# Patient Record
Sex: Male | Born: 1960
Health system: Southern US, Community
[De-identification: ages and names within clinical notes are randomized; demographics above are authoritative.]

## PROBLEM LIST (undated history)

## (undated) DIAGNOSIS — I639 Cerebral infarction, unspecified: Secondary | ICD-10-CM

## (undated) DIAGNOSIS — I452 Bifascicular block: Secondary | ICD-10-CM

## (undated) DIAGNOSIS — I441 Atrioventricular block, second degree: Secondary | ICD-10-CM

## (undated) DIAGNOSIS — N182 Chronic kidney disease, stage 2 (mild): Secondary | ICD-10-CM

## (undated) DIAGNOSIS — I890 Lymphedema, not elsewhere classified: Secondary | ICD-10-CM

## (undated) DIAGNOSIS — I422 Other hypertrophic cardiomyopathy: Secondary | ICD-10-CM

## (undated) DIAGNOSIS — I10 Essential (primary) hypertension: Secondary | ICD-10-CM

## (undated) DIAGNOSIS — R609 Edema, unspecified: Secondary | ICD-10-CM

## (undated) DIAGNOSIS — I421 Obstructive hypertrophic cardiomyopathy: Secondary | ICD-10-CM

## (undated) DIAGNOSIS — R0600 Dyspnea, unspecified: Secondary | ICD-10-CM

## (undated) DIAGNOSIS — E119 Type 2 diabetes mellitus without complications: Secondary | ICD-10-CM

## (undated) DIAGNOSIS — I5030 Unspecified diastolic (congestive) heart failure: Secondary | ICD-10-CM

## (undated) DIAGNOSIS — J42 Unspecified chronic bronchitis: Secondary | ICD-10-CM

## (undated) DIAGNOSIS — Z872 Personal history of diseases of the skin and subcutaneous tissue: Secondary | ICD-10-CM

## (undated) DIAGNOSIS — H269 Unspecified cataract: Secondary | ICD-10-CM

## (undated) DIAGNOSIS — Z950 Presence of cardiac pacemaker: Secondary | ICD-10-CM

## (undated) DIAGNOSIS — M109 Gout, unspecified: Secondary | ICD-10-CM

## (undated) DIAGNOSIS — J45998 Other asthma: Secondary | ICD-10-CM

## (undated) DIAGNOSIS — J189 Pneumonia, unspecified organism: Secondary | ICD-10-CM

## (undated) DIAGNOSIS — G510 Bell's palsy: Secondary | ICD-10-CM

## (undated) DIAGNOSIS — E7521 Fabry (-Anderson) disease: Secondary | ICD-10-CM

## (undated) DIAGNOSIS — R0602 Shortness of breath: Secondary | ICD-10-CM

## (undated) DIAGNOSIS — T7840XA Allergy, unspecified, initial encounter: Secondary | ICD-10-CM

## (undated) HISTORY — DX: Bifascicular block: I45.2

## (undated) HISTORY — DX: Allergy, unspecified, initial encounter: T78.40XA

## (undated) HISTORY — DX: Unspecified cataract: H26.9

## (undated) HISTORY — DX: Atrioventricular block, second degree: I44.1

## (undated) HISTORY — DX: Obstructive hypertrophic cardiomyopathy: I42.1

## (undated) HISTORY — PX: CARDIAC CATHETERIZATION: SHX172

## (undated) HISTORY — DX: Cerebral infarction, unspecified: I63.9

## (undated) HISTORY — DX: Chronic diastolic (congestive) heart failure: I50.32

---

## 2008-06-07 DIAGNOSIS — I5032 Chronic diastolic (congestive) heart failure: Secondary | ICD-10-CM

## 2008-06-07 HISTORY — DX: Chronic diastolic (congestive) heart failure: I50.32

## 2008-09-06 ENCOUNTER — Encounter: Admission: RE | Admit: 2008-09-06 | Discharge: 2008-09-06 | Payer: Self-pay | Admitting: Family Medicine

## 2009-03-27 ENCOUNTER — Encounter: Admission: RE | Admit: 2009-03-27 | Discharge: 2009-03-27 | Payer: Self-pay | Admitting: Internal Medicine

## 2009-04-04 ENCOUNTER — Encounter: Admission: RE | Admit: 2009-04-04 | Discharge: 2009-04-04 | Payer: Self-pay | Admitting: Internal Medicine

## 2009-12-11 ENCOUNTER — Observation Stay (HOSPITAL_COMMUNITY): Admission: EM | Admit: 2009-12-11 | Discharge: 2009-12-12 | Payer: Self-pay | Admitting: Emergency Medicine

## 2009-12-11 ENCOUNTER — Encounter (INDEPENDENT_AMBULATORY_CARE_PROVIDER_SITE_OTHER): Payer: Self-pay | Admitting: Emergency Medicine

## 2009-12-11 ENCOUNTER — Ambulatory Visit: Payer: Self-pay | Admitting: Surgery

## 2010-02-27 ENCOUNTER — Encounter: Payer: Self-pay | Admitting: Internal Medicine

## 2010-03-13 ENCOUNTER — Ambulatory Visit (HOSPITAL_COMMUNITY): Admission: RE | Admit: 2010-03-13 | Discharge: 2010-03-13 | Payer: Self-pay | Admitting: Obstetrics & Gynecology

## 2010-04-27 ENCOUNTER — Telehealth: Payer: Self-pay | Admitting: Internal Medicine

## 2010-06-07 DIAGNOSIS — E119 Type 2 diabetes mellitus without complications: Secondary | ICD-10-CM

## 2010-06-07 HISTORY — DX: Type 2 diabetes mellitus without complications: E11.9

## 2010-06-28 ENCOUNTER — Encounter: Payer: Self-pay | Admitting: Family Medicine

## 2010-07-07 NOTE — Progress Notes (Signed)
Summary: nos appt  Phone Note Call from Patient   Caller: [email or web address]  Call For: young Summary of Call: LMTCB x2 to rsc nos from 11/18. Initial call taken by: Netta Neat,  April 27, 2010 2:54 PM

## 2010-07-07 NOTE — Letter (Signed)
Summary: [facility]  Genetics/[facility]   Imported By: Bubba Hales 03/31/2010 07:26:18  _____________________________________________________________________  External Attachment:    Type:   Image     Comment:   External Document

## 2010-07-31 ENCOUNTER — Emergency Department (HOSPITAL_COMMUNITY)
Admission: EM | Admit: 2010-07-31 | Discharge: 2010-07-31 | Disposition: A | Discharge status: Home / Self Care | Payer: 59 | Attending: Emergency Medicine | Admitting: Emergency Medicine

## 2010-07-31 ENCOUNTER — Inpatient Hospital Stay (INDEPENDENT_AMBULATORY_CARE_PROVIDER_SITE_OTHER)
Admission: RE | Admit: 2010-07-31 | Discharge: 2010-07-31 | Disposition: A | Discharge status: Home / Self Care | Payer: 59 | Source: Ambulatory Visit | Attending: Family Medicine | Admitting: Family Medicine

## 2010-07-31 DIAGNOSIS — I10 Essential (primary) hypertension: Secondary | ICD-10-CM | POA: Insufficient documentation

## 2010-07-31 DIAGNOSIS — L02419 Cutaneous abscess of limb, unspecified: Secondary | ICD-10-CM

## 2010-07-31 DIAGNOSIS — E756 Lipid storage disorder, unspecified: Secondary | ICD-10-CM | POA: Insufficient documentation

## 2010-07-31 DIAGNOSIS — L03119 Cellulitis of unspecified part of limb: Secondary | ICD-10-CM

## 2010-07-31 DIAGNOSIS — R609 Edema, unspecified: Secondary | ICD-10-CM | POA: Insufficient documentation

## 2010-07-31 DIAGNOSIS — M7989 Other specified soft tissue disorders: Secondary | ICD-10-CM | POA: Insufficient documentation

## 2010-07-31 LAB — DIFFERENTIAL
Basophils Absolute: 0 10*3/uL (ref 0.0–0.1)
Basophils Relative: 1 % (ref 0–1)
Eosinophils Absolute: 0.3 10*3/uL (ref 0.0–0.7)
Eosinophils Relative: 6 % — ABNORMAL HIGH (ref 0–5)
Lymphocytes Relative: 39 % (ref 12–46)
Lymphs Abs: 2.3 10*3/uL (ref 0.7–4.0)
Monocytes Absolute: 0.5 10*3/uL (ref 0.1–1.0)
Monocytes Relative: 8 % (ref 3–12)
Neutro Abs: 2.7 10*3/uL (ref 1.7–7.7)
Neutrophils Relative %: 46 % (ref 43–77)

## 2010-07-31 LAB — POCT I-STAT, CHEM 8
BUN: 9 mg/dL (ref 6–23)
Calcium, Ion: 1.12 mmol/L (ref 1.12–1.32)
Chloride: 106 mEq/L (ref 96–112)
Creatinine, Ser: 1.3 mg/dL (ref 0.4–1.5)
Glucose, Bld: 88 mg/dL (ref 70–99)
HCT: 44 % (ref 39.0–52.0)
Hemoglobin: 15 g/dL (ref 13.0–17.0)
Potassium: 4.2 mEq/L (ref 3.5–5.1)
Sodium: 142 mEq/L (ref 135–145)
TCO2: 28 mmol/L (ref 0–100)

## 2010-07-31 LAB — CBC
HCT: 43.2 % (ref 39.0–52.0)
Hemoglobin: 14.6 g/dL (ref 13.0–17.0)
MCH: 30.3 pg (ref 26.0–34.0)
MCHC: 33.8 g/dL (ref 30.0–36.0)
MCV: 89.6 fL (ref 78.0–100.0)
Platelets: 182 10*3/uL (ref 150–400)
RBC: 4.82 MIL/uL (ref 4.22–5.81)
RDW: 13.1 % (ref 11.5–15.5)
WBC: 5.8 10*3/uL (ref 4.0–10.5)

## 2010-07-31 LAB — PROTIME-INR
INR: 0.95 (ref 0.00–1.49)
Prothrombin Time: 12.9 seconds (ref 11.6–15.2)

## 2010-07-31 LAB — APTT: aPTT: 29 seconds (ref 24–37)

## 2010-08-23 LAB — POCT I-STAT, CHEM 8
BUN: 24 mg/dL — ABNORMAL HIGH (ref 6–23)
Calcium, Ion: 1.01 mmol/L — ABNORMAL LOW (ref 1.12–1.32)
Chloride: 108 mEq/L (ref 96–112)
Creatinine, Ser: 1.6 mg/dL — ABNORMAL HIGH (ref 0.4–1.5)
Glucose, Bld: 122 mg/dL — ABNORMAL HIGH (ref 70–99)
HCT: 42 % (ref 39.0–52.0)
Hemoglobin: 14.3 g/dL (ref 13.0–17.0)
Potassium: 4 mEq/L (ref 3.5–5.1)
Sodium: 140 mEq/L (ref 135–145)
TCO2: 26 mmol/L (ref 0–100)

## 2010-08-23 LAB — CBC
HCT: 41.1 % (ref 39.0–52.0)
Hemoglobin: 13.9 g/dL (ref 13.0–17.0)
MCH: 30.3 pg (ref 26.0–34.0)
MCHC: 33.9 g/dL (ref 30.0–36.0)
MCV: 89.3 fL (ref 78.0–100.0)
Platelets: 151 10*3/uL (ref 150–400)
RBC: 4.6 MIL/uL (ref 4.22–5.81)
RDW: 14.2 % (ref 11.5–15.5)
WBC: 9.1 10*3/uL (ref 4.0–10.5)

## 2010-08-23 LAB — DIFFERENTIAL
Basophils Absolute: 0 10*3/uL (ref 0.0–0.1)
Basophils Relative: 0 % (ref 0–1)
Eosinophils Absolute: 0 10*3/uL (ref 0.0–0.7)
Eosinophils Relative: 1 % (ref 0–5)
Lymphocytes Relative: 15 % (ref 12–46)
Lymphs Abs: 1.3 10*3/uL (ref 0.7–4.0)
Monocytes Absolute: 1.4 10*3/uL — ABNORMAL HIGH (ref 0.1–1.0)
Monocytes Relative: 15 % — ABNORMAL HIGH (ref 3–12)
Neutro Abs: 6.3 10*3/uL (ref 1.7–7.7)
Neutrophils Relative %: 70 % (ref 43–77)

## 2010-11-10 ENCOUNTER — Encounter (INDEPENDENT_AMBULATORY_CARE_PROVIDER_SITE_OTHER): Payer: 59 | Admitting: Vascular Surgery

## 2010-11-10 DIAGNOSIS — I890 Lymphedema, not elsewhere classified: Secondary | ICD-10-CM

## 2010-11-11 NOTE — Consult Note (Signed)
NEW PATIENT CONSULTATION  [name], [name] DOB:  November 13, 1960                                       11/10/2010 [id number]  The patient presents today for evaluation of lower extremity lymphedema. 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 He does have a long history of progressive lymphedema.  He has had episodes of cellulitis in the past but none currently.  His left leg swelling is markedly greater than his right leg swelling.  He has difficulty with compression garments.  PAST HISTORY:  Significant for baseline chronic renal insufficiency, hypertension and elevated cholesterol.  He has never had surgery.  SOCIAL HISTORY:  He is single with no children.  He works as a Research scientist (physical sciences). He does not smoke or drink alcohol.  FAMILY HISTORY:  Negative for premature atherosclerotic disease.  REVIEW OF SYSTEMS:  Weight is 245 pounds.  He is 5 feet 9 inches tall. NEUROLOGIC:  Positive for blackouts. PULMONARY:  Positive for wheezes. URINARY:  Frequent urination. Otherwise, review of systems is negative.  PHYSICAL EXAMINATION:  General:  Well-developed, well-nourished black male appearing his stated age.  No acute distress.  Vital Signs:  Blood pressure 130/75, pulse 73, respirations 24.  HEENT:  Normal. Extremities:  He does have palpable popliteal pulses.  I do not palpate pedal pulses related to the swelling.  He does have significant lymphedema, much more so on the left than on the right.  He does have a slight area of excoriation over the dorsum of his foot near his toes on the left.  I have explained the chronic nature of his lymphedema, that it is incurable but is controllable.  He had been attempted to be scheduled with Cheral Almas, lymphedema specialist with [facility], and told that he had a 6 to 9 month wait for a visit.  We  have attempted to schedule him in either the [facility] or [facility].  In the meantime, we have given him a prescription for knee-high compression.  He has attempted wear thigh-high in the past without success.  I explained that the thigh-high would have some benefit over knee-high, but if he can be compliant with knee-high, this would be better than nothing.  He understands that he may have recurrent episodes of cellulitis and if so the treatment of this.  We will attempt to have him seen by someone in a lymphedema clinic, and he will see [place] on a p.r.n. basis.    [name], [name]. Electronically Signed  TFE/MEDQ  [name]:  11/10/2010  T:  11/11/2010  Job:  [id number]  cc:   Lindwood Qua, MD

## 2010-12-25 ENCOUNTER — Other Ambulatory Visit: Payer: Self-pay | Admitting: Nephrology

## 2010-12-25 ENCOUNTER — Ambulatory Visit (HOSPITAL_COMMUNITY): Payer: 59 | Attending: Nephrology

## 2010-12-25 DIAGNOSIS — E756 Lipid storage disorder, unspecified: Secondary | ICD-10-CM | POA: Insufficient documentation

## 2010-12-25 LAB — URINALYSIS, DIPSTICK ONLY
Bilirubin Urine: NEGATIVE
Glucose, UA: NEGATIVE mg/dL
Hgb urine dipstick: NEGATIVE
Ketones, ur: NEGATIVE mg/dL
Leukocytes, UA: NEGATIVE
Nitrite: NEGATIVE
Protein, ur: 30 mg/dL — AB
Specific Gravity, Urine: 1.014 (ref 1.005–1.030)
Urobilinogen, UA: 0.2 mg/dL (ref 0.0–1.0)
pH: 5.5 (ref 5.0–8.0)

## 2011-01-08 ENCOUNTER — Encounter (HOSPITAL_COMMUNITY): Payer: 59 | Attending: Nephrology

## 2011-01-08 DIAGNOSIS — E756 Lipid storage disorder, unspecified: Secondary | ICD-10-CM | POA: Insufficient documentation

## 2011-01-08 DIAGNOSIS — I890 Lymphedema, not elsewhere classified: Secondary | ICD-10-CM | POA: Insufficient documentation

## 2011-01-08 DIAGNOSIS — G4730 Sleep apnea, unspecified: Secondary | ICD-10-CM | POA: Insufficient documentation

## 2011-01-08 DIAGNOSIS — I10 Essential (primary) hypertension: Secondary | ICD-10-CM | POA: Insufficient documentation

## 2011-01-08 DIAGNOSIS — E669 Obesity, unspecified: Secondary | ICD-10-CM | POA: Insufficient documentation

## 2011-01-22 ENCOUNTER — Other Ambulatory Visit: Payer: Self-pay | Admitting: Nephrology

## 2011-01-22 ENCOUNTER — Encounter (HOSPITAL_COMMUNITY): Payer: 59

## 2011-01-22 LAB — COMPREHENSIVE METABOLIC PANEL
ALT: 23 U/L (ref 0–53)
AST: 27 U/L (ref 0–37)
Albumin: 3.1 g/dL — ABNORMAL LOW (ref 3.5–5.2)
Alkaline Phosphatase: 60 U/L (ref 39–117)
BUN: 12 mg/dL (ref 6–23)
CO2: 27 mEq/L (ref 19–32)
Calcium: 9.4 mg/dL (ref 8.4–10.5)
Chloride: 106 mEq/L (ref 96–112)
Creatinine, Ser: 1.13 mg/dL (ref 0.50–1.35)
GFR calc Af Amer: >60 mL/min (ref >60)
GFR calc non Af Amer: >60 mL/min (ref >60)
Glucose, Bld: 96 mg/dL (ref 70–99)
Potassium: 4.4 mEq/L (ref 3.5–5.1)
Sodium: 139 mEq/L (ref 135–145)
Total Bilirubin: 0.5 mg/dL (ref 0.3–1.2)
Total Protein: 7.5 g/dL (ref 6.0–8.3)

## 2011-01-22 LAB — URINALYSIS, ROUTINE W REFLEX MICROSCOPIC
Bilirubin Urine: NEGATIVE
Glucose, UA: NEGATIVE mg/dL
Hgb urine dipstick: NEGATIVE
Ketones, ur: NEGATIVE mg/dL
Leukocytes, UA: NEGATIVE
Nitrite: NEGATIVE
Protein, ur: 30 mg/dL — AB
Specific Gravity, Urine: 1.01 (ref 1.005–1.030)
Urobilinogen, UA: 0.2 mg/dL (ref 0.0–1.0)
pH: 6.5 (ref 5.0–8.0)

## 2011-01-22 LAB — CBC
HCT: 40.4 % (ref 39.0–52.0)
Hemoglobin: 13.4 g/dL (ref 13.0–17.0)
MCH: 29.9 pg (ref 26.0–34.0)
MCHC: 33.2 g/dL (ref 30.0–36.0)
MCV: 90.2 fL (ref 78.0–100.0)
Platelets: 196 10*3/uL (ref 150–400)
RBC: 4.48 MIL/uL (ref 4.22–5.81)
RDW: 13 % (ref 11.5–15.5)
WBC: 5.6 10*3/uL (ref 4.0–10.5)

## 2011-01-22 LAB — URINE MICROSCOPIC-ADD ON

## 2011-01-22 LAB — PHOSPHORUS: Phosphorus: 4 mg/dL (ref 2.3–4.6)

## 2011-02-05 ENCOUNTER — Other Ambulatory Visit: Payer: Self-pay | Admitting: Nephrology

## 2011-02-05 ENCOUNTER — Encounter (HOSPITAL_COMMUNITY): Payer: 59

## 2011-02-05 LAB — CBC
HCT: 41.6 % (ref 39.0–52.0)
Hemoglobin: 13.8 g/dL (ref 13.0–17.0)
MCH: 29.8 pg (ref 26.0–34.0)
MCHC: 33.2 g/dL (ref 30.0–36.0)
MCV: 89.8 fL (ref 78.0–100.0)
Platelets: 173 10*3/uL (ref 150–400)
RBC: 4.63 MIL/uL (ref 4.22–5.81)
RDW: 12.8 % (ref 11.5–15.5)
WBC: 5.7 10*3/uL (ref 4.0–10.5)

## 2011-02-11 ENCOUNTER — Emergency Department (HOSPITAL_COMMUNITY): Payer: 59

## 2011-02-11 ENCOUNTER — Inpatient Hospital Stay (HOSPITAL_COMMUNITY)
Admission: EM | Admit: 2011-02-11 | Discharge: 2011-02-13 | DRG: 244 | Disposition: A | Discharge status: Home / Self Care | Payer: 59 | Source: Ambulatory Visit | Attending: Internal Medicine | Admitting: Internal Medicine

## 2011-02-11 DIAGNOSIS — E785 Hyperlipidemia, unspecified: Secondary | ICD-10-CM | POA: Diagnosis present

## 2011-02-11 DIAGNOSIS — E756 Lipid storage disorder, unspecified: Secondary | ICD-10-CM | POA: Diagnosis present

## 2011-02-11 DIAGNOSIS — I4510 Unspecified right bundle-branch block: Secondary | ICD-10-CM | POA: Diagnosis present

## 2011-02-11 DIAGNOSIS — I517 Cardiomegaly: Secondary | ICD-10-CM | POA: Diagnosis present

## 2011-02-11 DIAGNOSIS — R55 Syncope and collapse: Secondary | ICD-10-CM | POA: Diagnosis present

## 2011-02-11 DIAGNOSIS — I441 Atrioventricular block, second degree: Principal | ICD-10-CM | POA: Diagnosis present

## 2011-02-11 DIAGNOSIS — N182 Chronic kidney disease, stage 2 (mild): Secondary | ICD-10-CM | POA: Diagnosis present

## 2011-02-11 DIAGNOSIS — I129 Hypertensive chronic kidney disease with stage 1 through stage 4 chronic kidney disease, or unspecified chronic kidney disease: Secondary | ICD-10-CM | POA: Diagnosis present

## 2011-02-11 DIAGNOSIS — I890 Lymphedema, not elsewhere classified: Secondary | ICD-10-CM | POA: Diagnosis present

## 2011-02-11 LAB — BASIC METABOLIC PANEL
BUN: 13 mg/dL (ref 6–23)
CO2: 26 mEq/L (ref 19–32)
Calcium: 9.6 mg/dL (ref 8.4–10.5)
Chloride: 107 mEq/L (ref 96–112)
Creatinine, Ser: 1.22 mg/dL (ref 0.50–1.35)
GFR calc Af Amer: >60 mL/min (ref >60)
GFR calc non Af Amer: >60 mL/min (ref >60)
Glucose, Bld: 129 mg/dL — ABNORMAL HIGH (ref 70–99)
Potassium: 3.7 mEq/L (ref 3.5–5.1)
Sodium: 144 mEq/L (ref 135–145)

## 2011-02-11 LAB — POCT I-STAT, CHEM 8
BUN: 15 mg/dL (ref 6–23)
Calcium, Ion: 1.18 mmol/L (ref 1.12–1.32)
Chloride: 108 mEq/L (ref 96–112)
Creatinine, Ser: 1.5 mg/dL — ABNORMAL HIGH (ref 0.50–1.35)
Glucose, Bld: 141 mg/dL — ABNORMAL HIGH (ref 70–99)
HCT: 44 % (ref 39.0–52.0)
Hemoglobin: 15 g/dL (ref 13.0–17.0)
Potassium: 3.8 mEq/L (ref 3.5–5.1)
Sodium: 145 mEq/L (ref 135–145)
TCO2: 26 mmol/L (ref 0–100)

## 2011-02-11 LAB — URINALYSIS, ROUTINE W REFLEX MICROSCOPIC
Bilirubin Urine: NEGATIVE
Glucose, UA: NEGATIVE mg/dL
Ketones, ur: NEGATIVE mg/dL
Leukocytes, UA: NEGATIVE
Nitrite: NEGATIVE
Protein, ur: 100 mg/dL — AB
Specific Gravity, Urine: 1.014 (ref 1.005–1.030)
Urobilinogen, UA: 0.2 mg/dL (ref 0.0–1.0)
pH: 6.5 (ref 5.0–8.0)

## 2011-02-11 LAB — POCT I-STAT TROPONIN I
Troponin i, poc: 0.18 ng/mL (ref 0.00–0.08)
Troponin i, poc: 0.22 ng/mL (ref 0.00–0.08)
Troponin i, poc: 0.27 ng/mL (ref 0.00–0.08)

## 2011-02-11 LAB — URINE MICROSCOPIC-ADD ON

## 2011-02-11 LAB — PROTIME-INR
INR: 0.97 (ref 0.00–1.49)
Prothrombin Time: 13.1 seconds (ref 11.6–15.2)

## 2011-02-11 LAB — CK TOTAL AND CKMB (NOT AT ARMC)
CK, MB: 26.3 ng/mL (ref 0.3–4.0)
Relative Index: 5.4 — ABNORMAL HIGH (ref 0.0–2.5)
Total CK: 484 U/L — ABNORMAL HIGH (ref 7–232)

## 2011-02-11 LAB — PRO B NATRIURETIC PEPTIDE: Pro B Natriuretic peptide (BNP): 541.1 pg/mL — ABNORMAL HIGH (ref 0–125)

## 2011-02-11 LAB — TROPONIN I: Troponin I: <0.3 ng/mL (ref <0.30)

## 2011-02-11 LAB — APTT: aPTT: 30 seconds (ref 24–37)

## 2011-02-11 MED ORDER — XENON XE 133 GAS
10.0000 | GAS_FOR_INHALATION | Freq: Once | RESPIRATORY_TRACT | Status: AC | PRN
  Administered 2011-02-11: 10 via RESPIRATORY_TRACT

## 2011-02-11 MED ORDER — TECHNETIUM TO 99M ALBUMIN AGGREGATED
6.0000 | Freq: Once | INTRAVENOUS | Status: AC | PRN
  Administered 2011-02-11: 6 via INTRAVENOUS

## 2011-02-12 DIAGNOSIS — I441 Atrioventricular block, second degree: Secondary | ICD-10-CM

## 2011-02-12 DIAGNOSIS — R55 Syncope and collapse: Secondary | ICD-10-CM

## 2011-02-12 HISTORY — PX: INSERT / REPLACE / REMOVE PACEMAKER: SUR710

## 2011-02-12 LAB — CBC
HCT: 41.6 % (ref 39.0–52.0)
Hemoglobin: 13.9 g/dL (ref 13.0–17.0)
MCH: 29.8 pg (ref 26.0–34.0)
MCHC: 33.4 g/dL (ref 30.0–36.0)
MCV: 89.1 fL (ref 78.0–100.0)
Platelets: 179 10*3/uL (ref 150–400)
RBC: 4.67 MIL/uL (ref 4.22–5.81)
RDW: 13 % (ref 11.5–15.5)
WBC: 8.4 10*3/uL (ref 4.0–10.5)

## 2011-02-12 LAB — CARDIAC PANEL(CRET KIN+CKTOT+MB+TROPI)
CK, MB: 19 ng/mL (ref 0.3–4.0)
CK, MB: 13.9 ng/mL (ref 0.3–4.0)
Relative Index: 4.3 — ABNORMAL HIGH (ref 0.0–2.5)
Relative Index: 3.7 — ABNORMAL HIGH (ref 0.0–2.5)
Total CK: 438 U/L — ABNORMAL HIGH (ref 7–232)
Total CK: 377 U/L — ABNORMAL HIGH (ref 7–232)
Troponin I: 0.31 ng/mL (ref <0.30)
Troponin I: 0.37 ng/mL (ref <0.30)

## 2011-02-12 LAB — DIFFERENTIAL
Basophils Absolute: 0 10*3/uL (ref 0.0–0.1)
Basophils Relative: 0 % (ref 0–1)
Eosinophils Absolute: 0.3 10*3/uL (ref 0.0–0.7)
Eosinophils Relative: 3 % (ref 0–5)
Lymphocytes Relative: 27 % (ref 12–46)
Lymphs Abs: 2.3 10*3/uL (ref 0.7–4.0)
Monocytes Absolute: 0.9 10*3/uL (ref 0.1–1.0)
Monocytes Relative: 10 % (ref 3–12)
Neutro Abs: 5 10*3/uL (ref 1.7–7.7)
Neutrophils Relative %: 60 % (ref 43–77)

## 2011-02-12 LAB — HEMOGLOBIN A1C
Hgb A1c MFr Bld: 6.6 % — ABNORMAL HIGH (ref <5.7)
Mean Plasma Glucose: 143 mg/dL — ABNORMAL HIGH (ref <117)

## 2011-02-12 LAB — LIPID PANEL
Cholesterol: 228 mg/dL — ABNORMAL HIGH (ref 0–200)
HDL: 46 mg/dL (ref >39)
LDL Cholesterol: 144 mg/dL — ABNORMAL HIGH (ref 0–99)
Total CHOL/HDL Ratio: 5 RATIO
Triglycerides: 191 mg/dL — ABNORMAL HIGH (ref <150)
VLDL: 38 mg/dL (ref 0–40)

## 2011-02-12 LAB — COMPREHENSIVE METABOLIC PANEL
ALT: 37 U/L (ref 0–53)
AST: 32 U/L (ref 0–37)
Albumin: 3 g/dL — ABNORMAL LOW (ref 3.5–5.2)
Alkaline Phosphatase: 55 U/L (ref 39–117)
BUN: 12 mg/dL (ref 6–23)
CO2: 29 mEq/L (ref 19–32)
Calcium: 9.4 mg/dL (ref 8.4–10.5)
Chloride: 109 mEq/L (ref 96–112)
Creatinine, Ser: 1.23 mg/dL (ref 0.50–1.35)
GFR calc Af Amer: >60 mL/min (ref >60)
GFR calc non Af Amer: >60 mL/min (ref >60)
Glucose, Bld: 117 mg/dL — ABNORMAL HIGH (ref 70–99)
Potassium: 4.6 mEq/L (ref 3.5–5.1)
Sodium: 145 mEq/L (ref 135–145)
Total Bilirubin: 0.4 mg/dL (ref 0.3–1.2)
Total Protein: 7.2 g/dL (ref 6.0–8.3)

## 2011-02-12 LAB — PHOSPHORUS: Phosphorus: 3.9 mg/dL (ref 2.3–4.6)

## 2011-02-12 LAB — TSH: TSH: 0.955 u[IU]/mL (ref 0.350–4.500)

## 2011-02-12 LAB — MAGNESIUM: Magnesium: 2 mg/dL (ref 1.5–2.5)

## 2011-02-12 LAB — SURGICAL PCR SCREEN
MRSA, PCR: NEGATIVE
Staphylococcus aureus: POSITIVE — AB

## 2011-02-12 NOTE — Consult Note (Signed)
NAME:  [name], [name] NO.:  [id number]  MEDICAL RECORD NO.:  [id number]  LOCATION:  77                         FACILITY:  [facility]  PHYSICIAN:  Jerline Pain, MD      DATE OF BIRTH:  11/13/1960  DATE OF CONSULTATION:  02/11/2011 DATE OF DISCHARGE:                                CONSULTATION   CARDIOLOGIST:  Belva Crome, MD  REASON FOR CONSULTATION:  Evaluation of syncope and mildly elevated troponin at the request of Dr. Kern Reap emergency division.  HISTORY OF PRESENT ILLNESS:  [name] is a 50 year old African American male with Fabry disease, lymphedema, hypertension who presented to [facility] emergency division after syncopal episode x3 earlier today.  He has never described a prior syncopal episode he states.  He was on his couch leaning over wrapping his left leg which is edematous from lymphedema and he felt a "hot wave" come over his body, felt very sweaty and the next thing he knew he had passed out onto the floor.  He did not know the length of loss of consciousness.  After coming to, he got up, tried to move to the kitchen and then passed out again.  He got up one more time and had a similar syncopal episode.  He did fall on his nose and chin causing a mild abrasion.  He describes no palpitations or jugular venous palpitations (a sign of possible AV dissociation from ventricular tachycardia) prior to this episode.  He clearly describes feeling very sweaty and diaphoretic after the episode and somewhat "groggy."  He takes Lasix unknown dose for his lymphedema, but did not take an extra dose or feel as though he was dehydrated.  He states that he drinks quite a bit of fluid during the day.  He describes no fevers, chills, melena, bleeding, new joint discomfort. He did feel nausea following the symptoms.  No chest pain.  No shortness of breath.  No headache.  No limb weakness.  Once again, he has not had a syncopal episode  previously.  In the emergency department, lab work performed demonstrated a mildly elevated troponin of 0.22 then 0.27.  His pro BNP was also mildly elevated at 541.  Hemoglobin was 15, hematocrit 44 and creatinine originally was 1.5 on repeat 1.2.  He has a history of chronic kidney disease.  Chest x-ray performed showed no evidence of any airspace disease, but cardiomegaly was present.  This was personally viewed.  He also had a C-spine of his neck due to the fall which showed no acute findings.  Because of the mildly positive troponin and syncopal episode, he underwent a V/Q scan which showed very low probability for acute pulmonary embolism.  V/Q was done because of his mildly elevated creatinine.  Currently, he is feeling well without any symptoms.  His EKG does demonstrate sinus rhythm with right bundle-branch block morphology, LVH, left anterior fascicular block and diffuse T-wave inversion in multiple leads II, III, F, V3, V4, V5, V6.  His prior EKG from Dr. Thompson Caul original encounter in 2011, shows sinus rhythm, right bundle-branch block with LVH pattern once again.  However, the T-wave inversion was only in  lead I aVL, V2, V3 on prior EKG.  The inferior leads were upright.  PAST MEDICAL HISTORY: 1. 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 2. Hypertension. 3. 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 4. Hypertension. 5. Possible diabetes.  ALLERGIES:  No known drug allergies.  MEDICATIONS: 1. Taking lisinopril 20 mg a day. 2. He was taking aspirin 81 mg a day up until very recently - stopped     for no particular reason other than running out of his medication. 3. He is also on Lasix unknown dose daily.  FAMILY HISTORY:  Mr. Grzesik does not have any biological children.  He has 1 of 3 children.  He has a 77 year old brother Dominica Severin who has end- stage renal disease and has been on dialysis since 2009.  He had a myocardial  infarction and TIA at approximately 17.  He also has a sister 77 years old that is healthy.  His father died at age 32 from possible bone cancer.  SOCIAL HISTORY:  Mr. Cooprider completed high school enlisted in the [employer], served for 9 years, moved from [place] in 2007.  He had several jobs and was on short-term disability.  He has coached football in the past, very rare alcohol use.  No tobacco use. No illicit drug use.  REVIEW OF SYSTEMS:  Unless specified above all other 12 review of systems negative.  Mild dry hacking cough which he has attributed to in the past of lisinopril, but continues to take it.  He also states that when lying down at night he occasionally has a harmonica likely wheeze that he can hear in his upper airways.  PHYSICAL EXAMINATION:  VITAL SIGNS:  Blood pressure 135/78, temperature 97.6, respirations 18, pulse is 56 and satting 97% on room air. GENERAL:  He is alert and oriented x3, in no acute distress, resting comfortably in bed. EYES:  Well-perfused conjunctivae.  EOMI.  No scleral icterus. HEENT:  Abrasion over nose and chin from fall. NECK:  Supple.  No lymphadenopathy.  No thyromegaly.  No carotid bruits.No JVD. CARDIOVASCULAR:  Regular rate and rhythm with a soft systolic murmur heard left lower sternal border.  Normal PMI.  I do not appreciate an S4 gallop. LUNGS:  Clear to auscultation bilaterally.  Normal respiratory effort. No wheezes.  No rales. ABDOMEN:  Mildly protuberant.  Positive bowel sounds.  Nontender.  No bruits. EXTREMITIES:  Marked lymphedema left greater than right lower extremity 3 to 4+, difficult to palpate pedal pulses, 2+ radial pulses bilaterally. GU:  Deferred. RECTAL:  Deferred. NEURO:  Cranial nerves II through XII is grossly intact with normal limb motion.  DATA:  As described above, V/Q low probability.  EKG with changes as above, creatinine 1.2-1.5, troponin mildly elevated at 0.18-0.22.  BNP is  541.  Prior medical records reviewed. Prior [facility] genetic clinical records reviewed.  Prior echocardiogram from 2011 at [facility] demonstrated mild LVH, mildly sclerotic aortic valve, upper normal aortic root diameter.  ASSESSMENT AND PLAN:  A 50 year old male with Fabry disease, 3 isolated syncopal episodes with abnormal EKG, lymphedema, chronic kidney disease stage II and mildly positive troponin. Syncope - as with any infiltrative disorder such as Fabry disease which can infiltrate the myocardium and is most likely responsible for his left ventricular hypertrophy pattern, one must consider arrhythmia as a possible diagnosis for his syncope.  Because of this, I recommend telemetry monitoring overnight and if unremarkable we would continue with event monitor for  at least 1 month post hospitalization. Ventricular arrhythmias can occur in this setting.  His syncopal story, however, more closely resembles a vagal or neurocardiogenic syncope given his hot flash, diaphoresis and prodrome prior to his passing out. We will check an echocardiogram to ensure proper structure and function and to monitor the degree of left ventricular hypertrophy.  The echocardiogram also be helpful to monitor any signs of aortic root dilatation which can be a finding as well in Fabry disease.  His EKG is different from his old EKG from 2011, in that his T-wave inversions are indifferently distributions.  However, this can be seen in left ventricular hypertrophy/hypertrophic cardiac disease and may not be a true manifestation of ischemia.  However, the suspicion is there.  His mildly elevated troponin maybe from endothelial dysfunction as a result of his Fabry disease as well and may also be secondary to our perfusion mismatch in the setting of LVH.  If troponin were to become highly elevated and/or there were to be a significant wall motion abnormality on echocardiogram, cardiac catheterization would be recommended.  I  will make him n.p.o. past midnight just in case further cardiac testing is warranted.  His BNP is slightly elevated.  However, intravascular volume depletion, perhaps somewhat iatrogenic from Lasix administration may have tipped him over to the syncopal episodes today.  I will give him a bolus of IV fluids.  This will also be helpful if cardiac catheterization is necessary in the future.  We will continue to follow.     Jerline Pain, MD     MCS/MEDQ  D:  02/11/2011  T:  02/12/2011  Job:  [id number]  cc:   Belva Crome, M.D. Donzetta Matters, MD  Electronically Signed by Candee Furbish MD on 02/12/2011 [id number] PM

## 2011-02-13 ENCOUNTER — Inpatient Hospital Stay (HOSPITAL_COMMUNITY): Payer: 59

## 2011-02-13 LAB — CK TOTAL AND CKMB (NOT AT ARMC)
CK, MB: 8.1 ng/mL (ref 0.3–4.0)
Relative Index: 1.7 (ref 0.0–2.5)
Total CK: 463 U/L — ABNORMAL HIGH (ref 7–232)

## 2011-02-13 LAB — TROPONIN I: Troponin I: 0.35 ng/mL (ref <0.30)

## 2011-02-14 NOTE — H&P (Signed)
  NAME:  MATHEWS, MOW NO.:  [id number]  MEDICAL RECORD NO.:  [id number]  LOCATION:  [facility]                         FACILITY:  [facility]  PHYSICIAN:  Hosie Poisson, MD       DATE OF BIRTH:  [birth year]  DATE OF ADMISSION:  02/11/2011 DATE OF DISCHARGE:                             HISTORY & PHYSICAL   ADDENDUM  PHYSICAL EXAMINATION:  VITAL SIGNS:  Temperature of 97.6, pulse of 61, blood pressure 150/90, respirations 18, saturating 98% on room air. GENERAL:  On exam, he is alert, afebrile, oriented x3, comfortable, in no acute distress. HEENT:  The patient has bruising over the nose and slight bruising on the forehead from the fall/syncope.  There is no JVD.  No scleral icterus. CARDIOVASCULAR:  S1 and S2 heard.  Regular rate and rhythm. RESPIRATORY:  Chest clear to auscultation bilaterally. ABDOMEN:  Obese, soft, nontender, nondistended.  Bowel sounds are heard. EXTREMITIES:  Bilateral lymphedema to the lower extremities.  No cyanosis. NEUROLOGICAL:  No focal deficits.  PERTINENT LABS:  The patient had a pro BNP, which was 541.  Stat CHEM-8 showing creatinine of 1.5, glucose of 141.  Point-of-care troponin first one is 0.18, second troponin 0.22, third troponin is 0.30.  CK-MB of 26.3, CK of 484, INR of 0.97.  Urinalysis negative for nitrites and leukocytosis.  Basic metabolic panel significant for a glucose of 129. Repeat creatinine was 1.22.  DIAGNOSTIC STUDIES:  The patient had a two-view chest x-ray, which showed cardiomegaly without decompensation, normal pulmonary vascularity.  The patient had a CT cervical spine without contrast, shows no acute findings.  VQ scan, which showed very low probability for acute PE.  EKG shows sinus rhythm at 71 per minute with LVH and with right bundle-branch block.  ASSESSMENT AND PLAN:  This is a 50 year old gentleman with history of FABER disease, chronic lymphedema, hypertension, chronic renal insufficiency, came in with  3 episodes of syncope occurred this morning, unwitnessed.  He is being admitted to hospitalist service for syncope workup.  He will be admitted to tele.  We have point-of-care troponins, which are elevated.  Cardiology consult was called to see if elevated troponins were secondary to an ischemic attack.  We will get a 2-D echocardiogram without contrast in the morning.  We will get a repeat EKG in a.m.  In view of his Fabry disease, the patient's syncope could be secondary to arrhythmia versus conduction defects.  Further management as per the clinical course and recommendations from Cardiology.  Chronic lymphedema.  We will continue with compression stockings.  Hypertension.  Blood pressure is slightly elevated at this time.  We will resume his antihypertensives in a.m.  Chronic renal insufficiency.  The patient's creatinine is at 1.2.  We will repeat his basis metabolic panel in a.m. to monitor his BUN and creatinine.  For DVT prophylaxis, subcutaneous Lovenox.  Dosing as per the pharmacy.  The patient is full code.       ______________________________ Hosie Poisson, MD     VA/MEDQ  D:  02/11/2011  T:  02/11/2011  Job:  [id number]  Electronically Signed by Hosie Poisson MD on 02/14/2011 09:08:32 PM

## 2011-02-15 ENCOUNTER — Telehealth: Payer: Self-pay | Admitting: Internal Medicine

## 2011-02-15 NOTE — Telephone Encounter (Signed)
lmom for them to get order from Dr Tamala Julian  But that Dr Rayann Heman says from his standpoint okay

## 2011-02-15 NOTE — Telephone Encounter (Signed)
Note to continue PT, or call melanie [phone]

## 2011-02-16 ENCOUNTER — Encounter: Payer: Self-pay | Admitting: *Deleted

## 2011-02-16 ENCOUNTER — Emergency Department (HOSPITAL_COMMUNITY): Payer: 59

## 2011-02-16 ENCOUNTER — Emergency Department (HOSPITAL_COMMUNITY)
Admission: EM | Admit: 2011-02-16 | Discharge: 2011-02-16 | Disposition: A | Discharge status: Home / Self Care | Payer: 59 | Attending: Emergency Medicine | Admitting: Emergency Medicine

## 2011-02-16 DIAGNOSIS — S8000XA Contusion of unspecified knee, initial encounter: Secondary | ICD-10-CM | POA: Insufficient documentation

## 2011-02-16 DIAGNOSIS — E756 Lipid storage disorder, unspecified: Secondary | ICD-10-CM | POA: Insufficient documentation

## 2011-02-16 DIAGNOSIS — M7989 Other specified soft tissue disorders: Secondary | ICD-10-CM | POA: Insufficient documentation

## 2011-02-16 DIAGNOSIS — Y92009 Unspecified place in unspecified non-institutional (private) residence as the place of occurrence of the external cause: Secondary | ICD-10-CM | POA: Insufficient documentation

## 2011-02-16 DIAGNOSIS — W19XXXA Unspecified fall, initial encounter: Secondary | ICD-10-CM | POA: Insufficient documentation

## 2011-02-16 DIAGNOSIS — I10 Essential (primary) hypertension: Secondary | ICD-10-CM | POA: Insufficient documentation

## 2011-02-16 DIAGNOSIS — M25569 Pain in unspecified knee: Secondary | ICD-10-CM | POA: Insufficient documentation

## 2011-02-19 ENCOUNTER — Other Ambulatory Visit: Payer: Self-pay | Admitting: Nephrology

## 2011-02-19 ENCOUNTER — Encounter (HOSPITAL_COMMUNITY): Payer: 59 | Attending: Nephrology

## 2011-02-19 DIAGNOSIS — E756 Lipid storage disorder, unspecified: Secondary | ICD-10-CM | POA: Insufficient documentation

## 2011-02-19 DIAGNOSIS — G4730 Sleep apnea, unspecified: Secondary | ICD-10-CM | POA: Insufficient documentation

## 2011-02-19 DIAGNOSIS — E669 Obesity, unspecified: Secondary | ICD-10-CM | POA: Insufficient documentation

## 2011-02-19 DIAGNOSIS — I10 Essential (primary) hypertension: Secondary | ICD-10-CM | POA: Insufficient documentation

## 2011-02-19 DIAGNOSIS — I890 Lymphedema, not elsewhere classified: Secondary | ICD-10-CM | POA: Insufficient documentation

## 2011-02-19 LAB — CBC
HCT: 40.3 % (ref 39.0–52.0)
Hemoglobin: 13.5 g/dL (ref 13.0–17.0)
MCH: 30 pg (ref 26.0–34.0)
MCHC: 33.5 g/dL (ref 30.0–36.0)
MCV: 89.6 fL (ref 78.0–100.0)
Platelets: 207 10*3/uL (ref 150–400)
RBC: 4.5 MIL/uL (ref 4.22–5.81)
RDW: 12.8 % (ref 11.5–15.5)
WBC: 6 10*3/uL (ref 4.0–10.5)

## 2011-02-19 LAB — URINALYSIS, ROUTINE W REFLEX MICROSCOPIC
Bilirubin Urine: NEGATIVE
Glucose, UA: NEGATIVE mg/dL
Hgb urine dipstick: NEGATIVE
Ketones, ur: NEGATIVE mg/dL
Leukocytes, UA: NEGATIVE
Nitrite: NEGATIVE
Protein, ur: NEGATIVE mg/dL
Specific Gravity, Urine: 1.013 (ref 1.005–1.030)
Urobilinogen, UA: 0.2 mg/dL (ref 0.0–1.0)
pH: 6 (ref 5.0–8.0)

## 2011-02-19 LAB — DIFFERENTIAL
Basophils Absolute: 0 10*3/uL (ref 0.0–0.1)
Basophils Relative: 0 % (ref 0–1)
Eosinophils Absolute: 0.4 10*3/uL (ref 0.0–0.7)
Eosinophils Relative: 6 % — ABNORMAL HIGH (ref 0–5)
Lymphocytes Relative: 34 % (ref 12–46)
Lymphs Abs: 2 10*3/uL (ref 0.7–4.0)
Monocytes Absolute: 0.7 10*3/uL (ref 0.1–1.0)
Monocytes Relative: 11 % (ref 3–12)
Neutro Abs: 2.9 10*3/uL (ref 1.7–7.7)
Neutrophils Relative %: 49 % (ref 43–77)

## 2011-02-19 LAB — COMPREHENSIVE METABOLIC PANEL
ALT: 21 U/L (ref 0–53)
AST: 22 U/L (ref 0–37)
Albumin: 3.1 g/dL — ABNORMAL LOW (ref 3.5–5.2)
Alkaline Phosphatase: 61 U/L (ref 39–117)
BUN: 16 mg/dL (ref 6–23)
CO2: 25 mEq/L (ref 19–32)
Calcium: 9.4 mg/dL (ref 8.4–10.5)
Chloride: 103 mEq/L (ref 96–112)
Creatinine, Ser: 1.14 mg/dL (ref 0.50–1.35)
GFR calc Af Amer: >60 mL/min (ref >60)
GFR calc non Af Amer: >60 mL/min (ref >60)
Glucose, Bld: 147 mg/dL — ABNORMAL HIGH (ref 70–99)
Potassium: 4.1 mEq/L (ref 3.5–5.1)
Sodium: 139 mEq/L (ref 135–145)
Total Bilirubin: 0.3 mg/dL (ref 0.3–1.2)
Total Protein: 7.9 g/dL (ref 6.0–8.3)

## 2011-02-19 LAB — PHOSPHORUS: Phosphorus: 3.8 mg/dL (ref 2.3–4.6)

## 2011-02-22 ENCOUNTER — Encounter: Payer: Self-pay | Admitting: Internal Medicine

## 2011-02-22 ENCOUNTER — Ambulatory Visit (INDEPENDENT_AMBULATORY_CARE_PROVIDER_SITE_OTHER): Payer: 59 | Admitting: *Deleted

## 2011-02-22 DIAGNOSIS — I441 Atrioventricular block, second degree: Secondary | ICD-10-CM

## 2011-02-22 LAB — PACEMAKER DEVICE OBSERVATION
AL AMPLITUDE: 5 mv
AL IMPEDENCE PM: 475 Ohm
AL THRESHOLD: 0.5 V
ATRIAL PACING PM: 11
BAMS-0001: 150 {beats}/min
BAMS-0003: 70 {beats}/min
BATTERY VOLTAGE: 3.1133 V
DEVICE MODEL PM: 7267950
RV LEAD AMPLITUDE: 12 mv
RV LEAD IMPEDENCE PM: 525 Ohm
RV LEAD THRESHOLD: 0.75 V
VENTRICULAR PACING PM: 21

## 2011-02-26 NOTE — H&P (Signed)
  NAME:  [name] [name] NO.:  [id number]  MEDICAL RECORD NO.:  [id number]  LOCATION:  [facility]                         FACILITY:  [facility]  PHYSICIAN:  Hosie Poisson, MD       DATE OF BIRTH:  1960-09-23  DATE OF ADMISSION:  02/11/2011 DATE OF DISCHARGE:                             HISTORY & PHYSICAL   CHIEF COMPLAINT:  Three episodes of syncope this morning.  HISTORY OF PRESENT ILLNESS:  This is a 50 year old gentleman with history of Fabry disease, chronic lymphedema, hypertension, hyperlipidemia, chronic renal insufficiency, woke up this morning and was trying to put his compression stockings on and he stood up after he wrapped his legs with compression stockings and felt dizzy and syncope. The patient lives by himself.  Nobody witnessed his syncope.  The patient is not aware of the duration of his syncope.  He woke up, tried to get up, and syncopized again for another 2 times.  During the episodes of syncope, the patient experienced nausea, flushing and was diaphoretic, clammy and cold.  Denies any shortness of breath or chest pain.  The patient denies any previous episodes of syncope in the past or in the family members.  The patient reports to feeling nauseous at the time of syncope.  Denies any vomiting, abdominal pain, diarrhea, or urinary complaints.  The patient denies any headaches or blurry vision. Denies any orthopnea or PND.  He has chronic lymphedema, has been trying to get an appointment in the lymphedema clinic.  The patient denies any tingling or numbness anywhere in his body.  Denies any focal weakness.  REVIEW OF SYSTEMS:  See HPI; otherwise, negative.  PAST MEDICAL HISTORY: 1. Fabry disease. 2. Chronic lymphedema. 3. Chronic renal insufficiency. 4. Hypertension. 5. Hyperlipidemia. 6. History of multiple episodes of cellulitis.  FAMILY HISTORY:  History of Fabry disease in his mother and history of end-stage renal disease in his mother and  brother.  SOCIAL HISTORY:  The patient lives by himself.  He denies smoking. Takes alcohol occasionally.  Denies any recreational drug use.  MEDICATIONS:  The patient is on; 1. Lasix. 2. Lisinopril.  Dictation ended at this point.          ______________________________ Hosie Poisson, MD     VA/MEDQ  D:  02/11/2011  T:  02/11/2011  Job:  [id number]  Electronically Signed by Hosie Poisson MD on 02/26/2011 11:50:05 AM

## 2011-03-05 ENCOUNTER — Encounter (HOSPITAL_COMMUNITY): Payer: 59

## 2011-03-05 ENCOUNTER — Other Ambulatory Visit: Payer: Self-pay | Admitting: Nephrology

## 2011-03-05 LAB — CBC
HCT: 41.1 % (ref 39.0–52.0)
Hemoglobin: 14.1 g/dL (ref 13.0–17.0)
MCH: 30.3 pg (ref 26.0–34.0)
MCHC: 34.3 g/dL (ref 30.0–36.0)
MCV: 88.2 fL (ref 78.0–100.0)
Platelets: 203 10*3/uL (ref 150–400)
RBC: 4.66 MIL/uL (ref 4.22–5.81)
RDW: 12.5 % (ref 11.5–15.5)
WBC: 7.5 10*3/uL (ref 4.0–10.5)

## 2011-03-05 LAB — URINALYSIS, ROUTINE W REFLEX MICROSCOPIC
Bilirubin Urine: NEGATIVE
Glucose, UA: NEGATIVE mg/dL
Hgb urine dipstick: NEGATIVE
Ketones, ur: NEGATIVE mg/dL
Leukocytes, UA: NEGATIVE
Nitrite: NEGATIVE
Protein, ur: NEGATIVE mg/dL
Specific Gravity, Urine: 1.012 (ref 1.005–1.030)
Urobilinogen, UA: 0.2 mg/dL (ref 0.0–1.0)
pH: 6 (ref 5.0–8.0)

## 2011-03-05 LAB — COMPREHENSIVE METABOLIC PANEL
ALT: 24 U/L (ref 0–53)
AST: 23 U/L (ref 0–37)
Albumin: 3.3 g/dL — ABNORMAL LOW (ref 3.5–5.2)
Alkaline Phosphatase: 75 U/L (ref 39–117)
BUN: 18 mg/dL (ref 6–23)
CO2: 25 mEq/L (ref 19–32)
Calcium: 9.8 mg/dL (ref 8.4–10.5)
Chloride: 103 mEq/L (ref 96–112)
Creatinine, Ser: 1.27 mg/dL (ref 0.50–1.35)
GFR calc Af Amer: >60 mL/min (ref >60)
GFR calc non Af Amer: >60 mL/min (ref >60)
Glucose, Bld: 97 mg/dL (ref 70–99)
Potassium: 4 mEq/L (ref 3.5–5.1)
Sodium: 138 mEq/L (ref 135–145)
Total Bilirubin: 0.3 mg/dL (ref 0.3–1.2)
Total Protein: 8.3 g/dL (ref 6.0–8.3)

## 2011-03-05 LAB — PHOSPHORUS: Phosphorus: 3.5 mg/dL (ref 2.3–4.6)

## 2011-03-08 ENCOUNTER — Ambulatory Visit (INDEPENDENT_AMBULATORY_CARE_PROVIDER_SITE_OTHER): Payer: 59 | Admitting: *Deleted

## 2011-03-08 ENCOUNTER — Encounter: Payer: Self-pay | Admitting: Internal Medicine

## 2011-03-08 DIAGNOSIS — I441 Atrioventricular block, second degree: Secondary | ICD-10-CM

## 2011-03-08 LAB — PACEMAKER DEVICE OBSERVATION
AL IMPEDENCE PM: 537.5 Ohm
AL THRESHOLD: 0.625 V
BAMS-0001: 150 {beats}/min
BAMS-0003: 70 {beats}/min
BATTERY VOLTAGE: 3.1133 V
DEVICE MODEL PM: 7267950
RV LEAD IMPEDENCE PM: 512.5 Ohm
RV LEAD THRESHOLD: 0.75 V

## 2011-03-08 NOTE — Progress Notes (Signed)
PPM interrogation

## 2011-03-11 NOTE — Consult Note (Signed)
NAME:  [name], [name] NO.:  [id number]  MEDICAL RECORD NO.:  [id number]  LOCATION:  24                         FACILITY:  [facility]  PHYSICIAN:  Thompson Grayer, MD       DATE OF BIRTH:  1960-07-21  DATE OF CONSULTATION: DATE OF DISCHARGE:                                CONSULTATION   REQUESTING PHYSICIAN:  Belva Crome, MD  REASON FOR CONSULTATION:  AV block and syncope.  HISTORY OF PRESENT ILLNESS:  [name] is a pleasant 50 year old African American gentleman with a history of Fabry disease, hypertension and lymphedema who presents after having syncope.  The patient reports that his first episode of syncope occurred in May 2012.  He states that he was driving his vehicle and had come to a stop when he had abrupt loss of consciousness for several seconds.  He regained consciousness and returned to his usual health state.  He did not seek medical attention at that time.  He has done well without any further episodes of syncope until February 11, 2011.  He reports that he was bending over at home adjusting his dressing which he chronically wears for lymphedema of his left leg.  Upon standing quickly he felt a "wave of heat "with diaphoresis and then syncope.  He reports falling to the floor with loss of consciousness for an unknown period of time.  Upon waking he was nauseated and remained diaphoretic.  He states that he tried to get up quickly and collapsed again with loss of consciousness.  He then found his way to a chair and sat.  He reports having several episodes of diaphoresis.  He called 911.  He denies associated chest pain, palpitations or shortness of breath.  He was brought to [facility] and evaluated where he was observed to have Mobitz II second- degree AV block on telemetry.  He is on no AV nodal agents chronically. He does however have a chronic right bundle-branch left anterior fascicular block.  He has had no ventricular ectopy  observed.  Presently he is otherwise without complaint.  PAST MEDICAL HISTORY: 1. Fabry disease with cardiac involvement. 2. Right bundle-branch left anterior hemi block. 3. Lymphedema. 4. Hypertension. 5. Glucose intolerance. 6. Stage II chronic kidney disease. 7. History of cellulitis.  MEDICATIONS:  Aspirin, Lovenox, Atrovent, albuterol.  ALLERGIES:  No known drug allergies.  SOCIAL HISTORY:  The patient lives alone in [place].  He denies tobacco, alcohol or drug use.  He works full-time.  FAMILY HISTORY:  His father died of bone cancer.  His mother has Fabry disease and his brother also has Fabry disease.  His brother has Fabry associated renal failure.  He has no family history of AV block, arrhythmias or sudden death.  REVIEW OF SYSTEMS:  All systems are reviewed and negative except as outlined in the HPI above.  Telemetry reveals sinus rhythm with intermittent 2:1 Mobitz II second- degree AV block.  PHYSICAL EXAMINATION:  VITAL SIGNS:  Blood pressure 129/75, heart rate 63, respirations 18, sats 99% on room air, afebrile. GENERAL:  The patient is a well-appearing male in no acute distress.  He is alert and oriented x3. HEENT:  Normocephalic, atraumatic.  Sclerae are clear.  Conjunctivae pink.  Oropharynx clear.  He does have an abrasion over his forehead and nose status post fall. NECK:  Supple.  No JVD. LUNGS:  Clear. HEART:  Regular rate and rhythm, 2/6 systolic ejection murmur along the left upper sternal border. GI:  Soft, nontender, nondistended.  Positive bowel sounds. EXTREMITIES:  No clubbing, cyanosis.  He does have 2+ bilateral lower extremity edema chronically. MUSCULOSKELETAL:  No deformity or atrophy. PSYCH:  Euthymic mood.  Full affect. NEURO:  Strength and sensation are intact.  LABORATORY FINDINGS:  Potassium 4.6, creatinine 1.2, hematocrit 41, platelets 179.  CK 44, CK-MB 26, troponin less than 0.3 initially, most recently CK 377, MB 13,  troponin 0.37.  EKG reveals sinus rhythm with 2:1 AV block.  He has a right bundle- branch left anterior fascicular QRS morphology as well as LVH.  Chest x-ray reveals cardiomegaly without decompensation.  Echocardiogram.  I have reviewed the patient's echocardiogram with Dr. Daneen Schick.  This reveals an ejection fraction of greater than 55%.  He does have LVH though I do not see any LV wall thickness greater than 20 mm.  IMPRESSION:  [name] is a very pleasant 50 year old gentleman with Fabry disease with cardiac involvement who now presents following several episodes of syncope.  He has been observed to have Mobitz II second-degree AV block on telemetry which is likely due to cardiac involvement of his Fabry disease.  He has had no ventricular arrhythmias documented.  His syncopal episode from yesterday is most consistent with postural syncope and actually may be related to hypotension and volume depletion.  However, I am concerned about the syncopal episode that he had in May at which time he had abrupt syncope.  I think that this is most likely arrhythmogenic in nature and most likely due to AV block. As we have no evidence of ventricular tachycardia or ventricular fibrillation documented, I do not have an indication for ICD implantation at this time.  In the setting of a normal ejection fraction I do not think that EP study would be helpful.  I am quite concerned about his AV nodal disease and I think that it is certainly likely to progress to complete heart block.  I therefore recommend pacemaker implantation at this time.  I discussed this recommendation with both Dr. Cristopher Peru and also with Dr. Daneen Schick who both feel that this is a very reasonable approach.  Risks, benefits and alternatives to pacemaker implantation were discussed at length with the patient today.  He understands that the risk include but are not limited to infection, bleeding,  perforation, vascular damage, tamponade, lead dislodgement, renal failure, stroke, MI and death.  He accepts these risks and wishes to proceed.  We will therefore plan for pacemaker implantation at the next available time.  I have also explained the importance of not driving for 6 months following his syncopal event.  I would like to follow him chronically in the electrophysiology clinic.  I will program his pacemaker to evaluate for ventricular arrhythmias.  If he is determined to have symptomatic ventricular arrhythmias down the road then he may require defibrillator implantation at that time.     Thompson Grayer, MD     JA/MEDQ  D:  02/12/2011  T:  02/13/2011  Job:  [id number]  Electronically Signed by Thompson Grayer MD on 03/11/2011 08:42:26 AM

## 2011-03-11 NOTE — Op Note (Signed)
NAME:  IKAIKA, BHATIA NO.:  [id number]  MEDICAL RECORD NO.:  [id number]  LOCATION:  9                         FACILITY:  [facility]  PHYSICIAN:  Thompson Grayer, MD       DATE OF BIRTH:  [birth year]  DATE OF PROCEDURE: DATE OF DISCHARGE:                              OPERATIVE REPORT   SURGEON:  Thompson Grayer, MD  PREPROCEDURE DIAGNOSES: 1. Syncope. 2. Mobitz II second-degree AV block. 3. 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  POSTPROCEDURE DIAGNOSES: 1. Syncope. 2. Mobitz II second-degree AV block. 3. 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  PROCEDURES:  Pacemaker implantation.  INTRODUCTION:  Mr. Bayles is a pleasant 50 year old gentleman with a history of Fabry disease who now presents following several episodes of syncope.  He has a preserved ejection fraction.  He does have left ventricular hypertrophy, but upon review of his echo with Dr. Tamala Julian, we did not see any left ventricular hypertrophy of greater than 20 mm.  On telemetry, he has been observed to have Mobitz II second-degree AV block and has a right bundle-branch left anterior hemiblock chronically.  He has had no ventricular arrhythmias documented.  It is felt that he has cardiac involvement of his Fabry's, which has affected AV nodal conduction and that progressive heart block  is very likely.  He, therefore, presents for pacemaker implantation.  DESCRIPTION OF PROCEDURE:  Informed written consent was obtained and the patient was brought to the Electrophysiology Lab in the fasting state. He was adequately sedated with intravenous Versed and fentanyl as outlined in the nursing report.  His left chest was prepped and draped in the usual sterile fashion by the EP lab staff.  The skin overlying the left deltopectoral region was infiltrated with lidocaine for local analgesia.  A 4-cm incision was made over the left  deltopectoral region. A left subcutaneous pacemaker pocket was fashioned using a combination of sharp and blunt dissection.  Electrocautery was required to assure hemostasis.  The left axillary vein was cannulated with fluoroscopic visualization.  No contrast was required for this endeavor.  Through the left axillary vein, a [employer] Tendril STS model [phone] (serial number [id number]) right atrial lead and a [employer] Medical Tendril STS model [phone] (serial number [id number]) right ventricular lead were advanced with fluoroscopic visualization into the right atrial appendage and right ventricular apex positions respectively.  Initial atrial lead P-waves measured 2.9 mV with an impedance of 630 ohms and a threshold of 1.9 volts at 0.5 milliseconds.  Right ventricular lead R- waves were 12 mV with impedance of 601 ohms and a threshold of 0.5 volts at 0.4 milliseconds.  Very large and prominent injury currents were observed with both leads.  The leads were, therefore, secured to the pectoralis fascia using #2 silk suture over the suture sleeves.  The leads were then connected to a [place] (serial number [id number]) pacemaker.  The pocket was irrigated with copious gentamicin solution.  The pacemaker was then placed into the pocket.  The pocket was then closed in two layers with 2.0  Vicryl suture for the subcutaneous and subcuticular layers.  Steri-Strips and a sterile dressing were then applied.  There were no early apparent complications.  CONCLUSIONS: 1. Successful pacemaker implantation for Mobitz II second-degree AV     block and syncope. 2. No contrast was required for the procedure today. 3. No early apparent complications.     Thompson Grayer, MD     JA/MEDQ  D:  02/12/2011  T:  02/13/2011  Job:  [id number]  cc:   Belva Crome, M.D.  Electronically Signed by Thompson Grayer MD on 03/11/2011 08:42:29 AM

## 2011-03-12 ENCOUNTER — Inpatient Hospital Stay (HOSPITAL_BASED_OUTPATIENT_CLINIC_OR_DEPARTMENT_OTHER)
Admission: RE | Admit: 2011-03-12 | Discharge: 2011-03-12 | Disposition: A | Discharge status: Home / Self Care | Payer: 59 | Source: Ambulatory Visit | Attending: Interventional Cardiology | Admitting: Interventional Cardiology

## 2011-03-12 DIAGNOSIS — R0609 Other forms of dyspnea: Secondary | ICD-10-CM | POA: Insufficient documentation

## 2011-03-12 DIAGNOSIS — I422 Other hypertrophic cardiomyopathy: Secondary | ICD-10-CM | POA: Insufficient documentation

## 2011-03-12 DIAGNOSIS — R0989 Other specified symptoms and signs involving the circulatory and respiratory systems: Secondary | ICD-10-CM | POA: Insufficient documentation

## 2011-03-12 LAB — POCT I-STAT 3, ART BLOOD GAS (G3+)
Acid-Base Excess: 1 mmol/L (ref 0.0–2.0)
Bicarbonate: 26.4 meq/L — ABNORMAL HIGH (ref 20.0–24.0)
O2 Saturation: 94 %
TCO2: 28 mmol/L (ref 0–100)
pCO2 arterial: 45 mmHg (ref 35.0–45.0)
pH, Arterial: 7.377 (ref 7.350–7.450)
pO2, Arterial: 73 mmHg — ABNORMAL LOW (ref 80.0–100.0)

## 2011-03-12 LAB — POCT I-STAT 3, VENOUS BLOOD GAS (G3P V)
Acid-Base Excess: 2 mmol/L (ref 0.0–2.0)
Bicarbonate: 28.1 mEq/L — ABNORMAL HIGH (ref 20.0–24.0)
O2 Saturation: 67 %
TCO2: 30 mmol/L (ref 0–100)
pCO2, Ven: 50.4 mmHg — ABNORMAL HIGH (ref 45.0–50.0)
pH, Ven: 7.355 — ABNORMAL HIGH (ref 7.250–7.300)
pO2, Ven: 37 mmHg (ref 30.0–45.0)

## 2011-03-15 ENCOUNTER — Telehealth: Payer: Self-pay | Admitting: Internal Medicine

## 2011-03-15 NOTE — Telephone Encounter (Signed)
Pt called. Pt wants to know about  taking therapy for his lymphadema. And the effect on his  pacemaker

## 2011-03-16 ENCOUNTER — Telehealth: Payer: Self-pay | Admitting: Internal Medicine

## 2011-03-16 NOTE — Telephone Encounter (Signed)
Pt calling needing a hard copy letter stating that excess fluid that is displaced will not effect pt pacemaker function. Please return pt call to discuss further.

## 2011-03-16 NOTE — Telephone Encounter (Signed)
Patient states that in order for him to continue his PT in [facility] for Lymphedema of left leg, PT needs a letter stating that the extra fluid that the machine disperse into the upper  Body, would not affects the pacemaker. Patient is aware that Dr. Rayann Heman and his nurse are not in the office today. I  will send this message to their desktop.

## 2011-03-16 NOTE — Telephone Encounter (Signed)
Spoke with pt about effect on pacemaker. Pt to get therapist to fax over for dr to sign off on therapy.

## 2011-03-16 NOTE — Telephone Encounter (Signed)
This should not affect his pacemaker.  OK to proceed with therapy.

## 2011-03-17 ENCOUNTER — Telehealth: Payer: Self-pay | Admitting: Internal Medicine

## 2011-03-17 NOTE — Telephone Encounter (Signed)
Note Faxed to Paulomi/Case Manager @ [phone]   03/17/11/km

## 2011-03-17 NOTE — Telephone Encounter (Signed)
Note Faxed to Vanda/Physical Therapy Dept @ [facility] @ [phone]   03/17/11/km

## 2011-03-17 NOTE — Telephone Encounter (Signed)
Case manager called regarding his lymphedema therapy.  They need a clearance letter faxed to them in order for him to continue this since he has had a pm implanted.   Fax:  [phone] If any questions, please give her a call.

## 2011-03-17 NOTE — Telephone Encounter (Signed)
Had already faxed to PT dept at [facility]  Will fax to below # as well

## 2011-03-18 ENCOUNTER — Inpatient Hospital Stay (HOSPITAL_BASED_OUTPATIENT_CLINIC_OR_DEPARTMENT_OTHER): Admission: RE | Admit: 2011-03-18 | Payer: 59 | Source: Ambulatory Visit | Admitting: Interventional Cardiology

## 2011-03-18 NOTE — Cardiovascular Report (Signed)
NAME:  [name], [name] NO.:  [id number]  MEDICAL RECORD NO.:  [id number]  LOCATION:  MDC                          FACILITY:  [facility]  PHYSICIAN:  Belva Crome, M.D.   DATE OF BIRTH:  September 04, 1960  DATE OF PROCEDURE:  03/12/2011 DATE OF DISCHARGE:  03/05/2011                           CARDIAC CATHETERIZATION   INDICATION FOR THIS STUDY:  [name] has 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mucopolysaccharide storage disease).  He has been experiencing dyspnea on exertion and fatigue.  He has also had recent syncope that was found to be secondary to high-grade AV block for which he had a DDD pacemaker placed within the past 6 weeks.  During the hospital stay for high-grade AV block, cardiac markers were noted to be mildly elevated.  Despite pacemaker insertion, the patient was continued to have exertional fatigue and dyspnea.  A nuclear study was performed and demonstrated a large region of diminished inferior uptake raising the question of ischemic heart disease.  Under the circumstances, left and right heart catheterization is being performed to define coronary anatomy and measure pulmonary artery and left ventricular filling pressures.  PROCEDURES PERFORMED: 1. Left heart catheterization. 2. Right heart catheterization. 3. Coronary angiography. 4. Cardiac output. 5. Left ventriculography.  DESCRIPTION:  Using careful technique and following 1% Xylocaine infiltration, a 7-French sheath was placed in the right femoral vein and a 4-French sheath in the right femoral artery using the modified Seldinger technique.  The patient was also premedicated with 25 mg of Benadryl and 60 mg of IV Solu-Medrol prior to proceeding with contrast administration due to a history of shellfish allergy and periorbital swelling.  Right heart pressures and thermodilution outputs were recorded.  An oximetry sample was obtained from the main pulmonary artery.  Left heart catheterization was  performed with a 4-French A2 multipurpose catheter. Hemodynamic recordings and left ventriculography was performed.  Right coronary angiography was performed.  Left coronary angiography was performed using a 4-French #4 left Judkins catheter.  No complications occurred.  Hemostasis was achieved with manual compression.  Because the patient's creatinine was slightly above normal at 1.33, he received 4 hours of fluid prior to discharge.  RESULTS: 1. Hemodynamic data:     a.     Aortic pressure 122/78.     b.     Left ventricular pressure 124/23 mmHg.     c.     Right atrial mean pressure 7 mmHg.     d.     Right ventricular pressure 27/9 mmHg.     e.     Pulmonary artery 26/11 mmHg.     f.     Pulmonary capillary wedge pressure average 10 mmHg pressure      and end inspiration 25 mmHg.  There was marked respiratory swaying      in the pulmonary capillary wedge recording, no gradient between LV      and PA were noted.     g.     Cardiac output 5.6 by Fick and 3.4 by thermodilution.         I. Left ventriculography:  The LV cavity is upper normal in  size.  The LV cavity has the configuration of a spade which             is most compatible with significant left ventricular apical             hypertrophy.  Ejection fraction is estimated at 55%.         II.Coronary angiography:     h.     Left main coronary:  Normal.     i.     Left anterior descending coronary:  Normal including      diagonal branches.  The LAD is transapical.     j.     Ramus intermedius:  Ramus intermedius is moderate in size      and gives origin to a bifurcating course along the anterolateral      wall.  No significant obstruction is noted.  D.  Circumflex      artery:  Terminates on the mid and inferolateral wall and      bifurcates distally, free of obstruction.     k.     Right coronary:  The right coronary artery is dominant      giving origin to the AV-nodal artery, PDA, and 2 left ventricular       branches.  No obstruction is noted.  CONCLUSIONS: 1. Hypertrophic cardiomyopathy with LV cavity appearance consistent     with significant apical hypertrophy. 2. Normal left ventricular systolic function with EF of 55%.  Evidence     of diastolic dysfunction with EDP of 23-25 mmHg after the A-wave. 3. Normal coronary arteries.  PLAN:  Management as diastolic heart failure.  Given the high end- diastolic pressures, perhaps more aggressive diuresis would help dyspnea.     Belva Crome, M.D.     HWS/MEDQ  D:  03/12/2011  T:  03/12/2011  Job:  [id number]  cc:   Lindwood Qua, MD Human Genetics Department [facility]  Electronically Signed by [name] M.D. on 03/18/2011 11:03:23 AM

## 2011-03-18 NOTE — Discharge Summary (Signed)
NAME:  [name], [name] NO.:  [id number]  MEDICAL RECORD NO.:  [id number]  LOCATION:  41                         FACILITY:  [facility]  PHYSICIAN:  Leana Gamer, MDDATE OF BIRTH:  27-Jun-1960  DATE OF ADMISSION:  02/11/2011 DATE OF DISCHARGE:  02/13/2011                              DISCHARGE SUMMARY   DISCHARGE DISPOSITION:  Home.  FINAL DISCHARGE DIAGNOSES: 1. Syncope. 2. Mobitz type 2 second-degree atrioventricular block status post     pacemaker placement. 3. Fabry disease with cardiac involvement and preserved ejection     fraction. 4. Hypertension. 5. Chronic renal insufficiency. 6. Hyperlipidemia.  DISCHARGE MEDICATIONS: 1. Aspirin 81 mg p.o. daily. 2. Oxycodone 5 mg p.o. q.4 h. p.r.n. pain. 3. Lasix 20 mg p.o. b.i.d. 4. Lisinopril 20 mg p.o. daily.  CONSULTANTS: 1. Dr. Rayann Heman, Electrophysiology. 2. Dr. Daneen Schick, [facility].  PROCEDURES:  Status post pacemaker placement.  DIAGNOSTIC STUDIES: 1. Two-view chest x-ray on admission which shows cardiopulmonary     without decompensation. 2. CT of the cervical spine which shows no acute findings. 3. V/Q scan which shows low probability for acute pulmonary embolus. 4. CT of the head without contrast which shows no acute intracranial     findings. 5. Two-view chest x-ray on the 8th which shows status post pacer     placement.  No complicating features such as pneumothorax     identified. 6. A 2-D echocardiogram without contrast which shows left ventricular     cavity size normal with mild concentric hypertrophy, systolic     function normal with an ejection fraction of 60-65%.  Doppler     parameters consistent with grade 1 diastolic dysfunction.  PRIMARY CARE PHYSICIAN:  Lindwood Qua, MD  ALLERGIES:  No known drug allergies.  CODE STATUS:  Full code.  CHIEF COMPLAINT:  Syncope.  HISTORY OF PRESENT ILLNESS:  Please refer to the H and P by Dr. Hosie Poisson for details of the  HPI.  However, in short this is a 50 year old gentleman with Fabry disease and chronic lymphedema who attempted to put on his compression stockings and he had felt dizzy and syncopated.  Apparently this has happened several times in the past several weeks, however, the patient did not alert anyone of these events.  The patient was brought to the hospital and we are asked to see the patient for further evaluation and management. 1. Syncope.  The patient was noted to have a Mobitz type 2 second     degree AV block.  Cardiology was consulted and the     Electrophysiology further recommended after that.  At the     evaluation, decision was made to place pacemaker.  Pacemaker was     installed and the patient has been having a paced rhythm with good     capture since then.  The patient also had elevated troponins and     elevated CK with CK-MBs.  The patient also had acute coronary     syndrome.  He was seen by Dr. Daneen Schick for [facility] who     recommends that the patient be discharged and follow up as an     outpatient  for further evaluation and management. 2. Hypertension.  The patient is noted to be hypertensive.  During     this hospitalization, his lisinopril was held as well as his Lasix.     The patient was resumed on his usual medications as it is felt that     his syncope was not related to any volume depletion or hypotension,     but rather due to the cardiac arrhythmia. 3. Fabry disease.  It is felt that the arrhythmia is likely related to     his Fabry disease which is otherwise stable.  At the time of discharge, the patient is stable.  PHYSICAL EXAMINATION:  VITAL SIGNS:  Temperature is 98.7, heart rate 72, blood pressure 146/93, respiratory rate 18, O2 sats are 98% on room air. HEENT:  He is normocephalic.  The patient does have some areas of what appears to be a turf burn on the face from where he felt.  Oropharynx is moist.  No exudate, erythema, or lesions are  noted. NECK:  Trachea is midline.  No masses, no thyromegaly, no JVD, no carotid bruit. RESPIRATORY:  The patient has a normal respiratory effort, equal excursion bilaterally.  No wheezing or rhonchi noted. CARDIOVASCULAR:  He has got a ventricular pace with good capture. Normal S1 and S2.  No murmurs, rubs, or gallops noted. ABDOMEN:  Obese, soft, nontender, nondistended.  No masses, no hepatosplenomegaly is noted. EXTREMITIES:  The patient has no lymphedema associated with this Fabry disease which is unchanged from his baseline. NEUROLOGICAL:  He has no focal neurological deficits.  Cranial nerves II- XII are grossly intact. PSYCHIATRIC:  He is alert and oriented x3.  Good insight and cognition. Good recent and remote recall.  FOLLOWUP:  The patient is to follow up with his primary care physician Dr. Lindwood Qua in 1 week.  He is to follow up with Dr. Rayann Heman in his office in 10 days regarding the wound and follow up with Dr. Daneen Schick in the office in 1 week.  The patient is also prohibited from driving for the next 6 months.  The patient can resume physical therapy, however, he is prohibited from working within the next 2 weeks.  DIETARY RESTRICTIONS:  The patient should be on a heart-healthy diet.  PHYSICAL RESTRICTIONS:  As noted above.  The patient has inquired about transportation since he is unable to thrive.  SCAT form has been downloaded and the physician verification form has been filled out for the patient.  Total time for this discharge process including face-to-face time approximately 35 minutes.     Leana Gamer, MD     MAM/MEDQ  D:  02/13/2011  T:  02/13/2011  Job:  [id number]  cc:   Lindwood Qua, MD Thompson Grayer, MD Belva Crome, M.D. Hosie Poisson, MD  Electronically Signed by Liston Alba MD on 03/18/2011 07:19:33 AM

## 2011-03-19 ENCOUNTER — Encounter (HOSPITAL_COMMUNITY): Payer: 59 | Attending: Nephrology

## 2011-03-19 DIAGNOSIS — I10 Essential (primary) hypertension: Secondary | ICD-10-CM | POA: Insufficient documentation

## 2011-03-19 DIAGNOSIS — G4730 Sleep apnea, unspecified: Secondary | ICD-10-CM | POA: Insufficient documentation

## 2011-03-19 DIAGNOSIS — I890 Lymphedema, not elsewhere classified: Secondary | ICD-10-CM | POA: Insufficient documentation

## 2011-03-19 DIAGNOSIS — E669 Obesity, unspecified: Secondary | ICD-10-CM | POA: Insufficient documentation

## 2011-03-19 DIAGNOSIS — E756 Lipid storage disorder, unspecified: Secondary | ICD-10-CM | POA: Insufficient documentation

## 2011-03-25 DIAGNOSIS — I890 Lymphedema, not elsewhere classified: Secondary | ICD-10-CM

## 2011-04-02 ENCOUNTER — Other Ambulatory Visit: Payer: Self-pay | Admitting: Nephrology

## 2011-04-02 ENCOUNTER — Encounter (HOSPITAL_COMMUNITY): Payer: 59

## 2011-04-02 LAB — LIPID PANEL
Cholesterol: 263 mg/dL — ABNORMAL HIGH (ref 0–200)
HDL: 51 mg/dL (ref >39)
LDL Cholesterol: 184 mg/dL — ABNORMAL HIGH (ref 0–99)
Total CHOL/HDL Ratio: 5.2 RATIO
Triglycerides: 140 mg/dL (ref <150)
VLDL: 28 mg/dL (ref 0–40)

## 2011-04-02 LAB — CBC
HCT: 42.3 % (ref 39.0–52.0)
Hemoglobin: 14 g/dL (ref 13.0–17.0)
MCH: 29.7 pg (ref 26.0–34.0)
MCHC: 33.1 g/dL (ref 30.0–36.0)
MCV: 89.6 fL (ref 78.0–100.0)
Platelets: 185 10*3/uL (ref 150–400)
RBC: 4.72 MIL/uL (ref 4.22–5.81)
RDW: 12.7 % (ref 11.5–15.5)
WBC: 5.8 10*3/uL (ref 4.0–10.5)

## 2011-04-02 LAB — COMPREHENSIVE METABOLIC PANEL
ALT: 28 U/L (ref 0–53)
AST: 25 U/L (ref 0–37)
Albumin: 3.2 g/dL — ABNORMAL LOW (ref 3.5–5.2)
Alkaline Phosphatase: 71 U/L (ref 39–117)
BUN: 17 mg/dL (ref 6–23)
CO2: 25 mEq/L (ref 19–32)
Calcium: 9.3 mg/dL (ref 8.4–10.5)
Chloride: 104 mEq/L (ref 96–112)
Creatinine, Ser: 1.25 mg/dL (ref 0.50–1.35)
GFR calc Af Amer: 76 mL/min — ABNORMAL LOW (ref >90)
GFR calc non Af Amer: 66 mL/min — ABNORMAL LOW (ref >90)
Glucose, Bld: 142 mg/dL — ABNORMAL HIGH (ref 70–99)
Potassium: 4 mEq/L (ref 3.5–5.1)
Sodium: 140 mEq/L (ref 135–145)
Total Bilirubin: 0.3 mg/dL (ref 0.3–1.2)
Total Protein: 7.7 g/dL (ref 6.0–8.3)

## 2011-04-02 LAB — FERRITIN: Ferritin: 536 ng/mL — ABNORMAL HIGH (ref 22–322)

## 2011-04-02 LAB — URINALYSIS, ROUTINE W REFLEX MICROSCOPIC
Bilirubin Urine: NEGATIVE
Glucose, UA: NEGATIVE mg/dL
Hgb urine dipstick: NEGATIVE
Ketones, ur: NEGATIVE mg/dL
Leukocytes, UA: NEGATIVE
Nitrite: NEGATIVE
Protein, ur: NEGATIVE mg/dL
Specific Gravity, Urine: 1.01 (ref 1.005–1.030)
Urobilinogen, UA: 0.2 mg/dL (ref 0.0–1.0)
pH: 5.5 (ref 5.0–8.0)

## 2011-04-02 LAB — DIFFERENTIAL
Basophils Absolute: 0 10*3/uL (ref 0.0–0.1)
Basophils Relative: 1 % (ref 0–1)
Eosinophils Absolute: 0.3 10*3/uL (ref 0.0–0.7)
Eosinophils Relative: 6 % — ABNORMAL HIGH (ref 0–5)
Lymphocytes Relative: 46 % (ref 12–46)
Lymphs Abs: 2.7 10*3/uL (ref 0.7–4.0)
Monocytes Absolute: 0.6 10*3/uL (ref 0.1–1.0)
Monocytes Relative: 10 % (ref 3–12)
Neutro Abs: 2.2 10*3/uL (ref 1.7–7.7)
Neutrophils Relative %: 38 % — ABNORMAL LOW (ref 43–77)

## 2011-04-02 LAB — IRON AND TIBC
Iron: 92 ug/dL (ref 42–135)
Saturation Ratios: 34 % (ref 20–55)
TIBC: 273 ug/dL (ref 215–435)
UIBC: 181 ug/dL (ref 125–400)

## 2011-04-02 LAB — PHOSPHORUS: Phosphorus: 3.6 mg/dL (ref 2.3–4.6)

## 2011-04-10 ENCOUNTER — Other Ambulatory Visit (HOSPITAL_COMMUNITY): Payer: Self-pay | Admitting: *Deleted

## 2011-04-14 ENCOUNTER — Other Ambulatory Visit (HOSPITAL_COMMUNITY): Payer: Self-pay | Admitting: *Deleted

## 2011-04-16 ENCOUNTER — Encounter (HOSPITAL_COMMUNITY)
Admission: RE | Admit: 2011-04-16 | Discharge: 2011-04-16 | Disposition: A | Discharge status: Home / Self Care | Payer: 59 | Source: Ambulatory Visit | Attending: Nephrology | Admitting: Nephrology

## 2011-04-16 DIAGNOSIS — I10 Essential (primary) hypertension: Secondary | ICD-10-CM | POA: Insufficient documentation

## 2011-04-16 DIAGNOSIS — E756 Lipid storage disorder, unspecified: Secondary | ICD-10-CM | POA: Insufficient documentation

## 2011-04-16 DIAGNOSIS — I890 Lymphedema, not elsewhere classified: Secondary | ICD-10-CM | POA: Insufficient documentation

## 2011-04-16 DIAGNOSIS — G4730 Sleep apnea, unspecified: Secondary | ICD-10-CM | POA: Insufficient documentation

## 2011-04-16 DIAGNOSIS — E669 Obesity, unspecified: Secondary | ICD-10-CM | POA: Insufficient documentation

## 2011-04-16 MED ORDER — AGALSIDASE BETA 5 MG IV SOLR
115.0000 mg | INTRAVENOUS | Status: DC
  Administered 2011-04-16: 115 mg via INTRAVENOUS
  Filled 2011-04-16: qty 23

## 2011-04-16 MED ORDER — SODIUM CHLORIDE 0.9 % IV SOLN: INTRAVENOUS | Status: DC

## 2011-04-27 ENCOUNTER — Other Ambulatory Visit (HOSPITAL_COMMUNITY): Payer: Self-pay | Admitting: *Deleted

## 2011-04-30 ENCOUNTER — Encounter (HOSPITAL_COMMUNITY)
Admission: RE | Admit: 2011-04-30 | Discharge: 2011-04-30 | Disposition: A | Discharge status: Home / Self Care | Payer: 59 | Source: Ambulatory Visit | Attending: Nephrology | Admitting: Nephrology

## 2011-04-30 ENCOUNTER — Encounter (HOSPITAL_COMMUNITY): Payer: Self-pay

## 2011-04-30 HISTORY — DX: Fabry (-anderson) disease: E75.21

## 2011-04-30 HISTORY — DX: Presence of cardiac pacemaker: Z95.0

## 2011-04-30 HISTORY — DX: Essential (primary) hypertension: I10

## 2011-04-30 LAB — URINE MICROSCOPIC-ADD ON

## 2011-04-30 LAB — URINALYSIS, ROUTINE W REFLEX MICROSCOPIC
Bilirubin Urine: NEGATIVE
Glucose, UA: NEGATIVE mg/dL
Hgb urine dipstick: NEGATIVE
Ketones, ur: NEGATIVE mg/dL
Leukocytes, UA: NEGATIVE
Nitrite: NEGATIVE
Protein, ur: 30 mg/dL — AB
Specific Gravity, Urine: 1.013 (ref 1.005–1.030)
Urobilinogen, UA: 0.2 mg/dL (ref 0.0–1.0)
pH: 5.5 (ref 5.0–8.0)

## 2011-04-30 LAB — COMPREHENSIVE METABOLIC PANEL
ALT: 27 U/L (ref 0–53)
AST: 25 U/L (ref 0–37)
Albumin: 3.2 g/dL — ABNORMAL LOW (ref 3.5–5.2)
Alkaline Phosphatase: 77 U/L (ref 39–117)
BUN: 19 mg/dL (ref 6–23)
CO2: 23 mEq/L (ref 19–32)
Calcium: 9.5 mg/dL (ref 8.4–10.5)
Chloride: 104 mEq/L (ref 96–112)
Creatinine, Ser: 1.3 mg/dL (ref 0.50–1.35)
GFR calc Af Amer: 73 mL/min — ABNORMAL LOW (ref >90)
GFR calc non Af Amer: 63 mL/min — ABNORMAL LOW (ref >90)
Glucose, Bld: 167 mg/dL — ABNORMAL HIGH (ref 70–99)
Potassium: 4.3 mEq/L (ref 3.5–5.1)
Sodium: 139 mEq/L (ref 135–145)
Total Bilirubin: 0.1 mg/dL — ABNORMAL LOW (ref 0.3–1.2)
Total Protein: 7.6 g/dL (ref 6.0–8.3)

## 2011-04-30 LAB — PHOSPHORUS: Phosphorus: 4.2 mg/dL (ref 2.3–4.6)

## 2011-04-30 MED ORDER — SODIUM CHLORIDE 0.9 % IV SOLN
115.0000 mg | INTRAVENOUS | Status: DC
  Administered 2011-04-30: 115 mg via INTRAVENOUS
  Filled 2011-04-30: qty 23

## 2011-04-30 MED ORDER — SODIUM CHLORIDE 0.9 % IV SOLN
INTRAVENOUS | Status: DC
  Administered 2011-04-30: 09:00:00 via INTRAVENOUS

## 2011-05-13 ENCOUNTER — Other Ambulatory Visit (HOSPITAL_COMMUNITY): Payer: Self-pay | Admitting: *Deleted

## 2011-05-14 ENCOUNTER — Encounter (HOSPITAL_COMMUNITY): Payer: Self-pay

## 2011-05-14 ENCOUNTER — Encounter (HOSPITAL_COMMUNITY)
Admission: RE | Admit: 2011-05-14 | Discharge: 2011-05-14 | Disposition: A | Discharge status: Home / Self Care | Payer: 59 | Source: Ambulatory Visit | Attending: Nephrology | Admitting: Nephrology

## 2011-05-14 DIAGNOSIS — G4730 Sleep apnea, unspecified: Secondary | ICD-10-CM | POA: Insufficient documentation

## 2011-05-14 DIAGNOSIS — E756 Lipid storage disorder, unspecified: Secondary | ICD-10-CM | POA: Insufficient documentation

## 2011-05-14 DIAGNOSIS — I10 Essential (primary) hypertension: Secondary | ICD-10-CM | POA: Insufficient documentation

## 2011-05-14 DIAGNOSIS — E669 Obesity, unspecified: Secondary | ICD-10-CM | POA: Insufficient documentation

## 2011-05-14 DIAGNOSIS — I890 Lymphedema, not elsewhere classified: Secondary | ICD-10-CM | POA: Insufficient documentation

## 2011-05-14 MED ORDER — SODIUM CHLORIDE 0.9 % IV SOLN
115.0000 mg | INTRAVENOUS | Status: DC
  Administered 2011-05-14: 115 mg via INTRAVENOUS
  Filled 2011-05-14: qty 23

## 2011-05-14 MED ORDER — SODIUM CHLORIDE 0.9 % IV SOLN
INTRAVENOUS | Status: DC
  Administered 2011-05-14: 12:00:00 via INTRAVENOUS

## 2011-05-14 NOTE — Progress Notes (Signed)
Pt states he took allegra and tylenol as premedication to treatment.

## 2011-05-19 ENCOUNTER — Ambulatory Visit (INDEPENDENT_AMBULATORY_CARE_PROVIDER_SITE_OTHER): Payer: 59 | Admitting: Internal Medicine

## 2011-05-19 ENCOUNTER — Encounter: Payer: Self-pay | Admitting: Internal Medicine

## 2011-05-19 DIAGNOSIS — E7521 Fabry (-Anderson) disease: Secondary | ICD-10-CM | POA: Insufficient documentation

## 2011-05-19 DIAGNOSIS — E756 Lipid storage disorder, unspecified: Secondary | ICD-10-CM

## 2011-05-19 DIAGNOSIS — I441 Atrioventricular block, second degree: Secondary | ICD-10-CM | POA: Insufficient documentation

## 2011-05-19 LAB — PACEMAKER DEVICE OBSERVATION
AL AMPLITUDE: 5 mv
AL IMPEDENCE PM: 475 Ohm
AL THRESHOLD: 0.625 V
ATRIAL PACING PM: 16
BAMS-0001: 150 {beats}/min
BAMS-0003: 70 {beats}/min
BATTERY VOLTAGE: 2.9629 V
DEVICE MODEL PM: 7267950
RV LEAD AMPLITUDE: 12 mv
RV LEAD IMPEDENCE PM: 475 Ohm
RV LEAD THRESHOLD: 0.875 V
VENTRICULAR PACING PM: 22

## 2011-05-19 NOTE — Assessment & Plan Note (Signed)
Normal pacemaker function See Claudia Desanctis Art report   He has atrial oversensing for which his atrial sensitivity was decreased from 0.5 to 1. He may actually have episodes of afib also.  We will continue to monitor this.  He does not have a land land but will consider Merlin.  Given h/o Fabry, I will follow him from an EP stand point long term.  Dr Tamala Julian is his primary cardiologist.

## 2011-05-19 NOTE — Progress Notes (Signed)
PCP:  Elizabeth Palau, MD  The patient presents today for routine electrophysiology followup.  Since his pacemaker was implanted, the patient reports doing reasonably well.  He continues to have SOB.  He was evaluated by Dr Tamala Julian and had a recent cath revealing no significant CAD.  He has been treated with lasix.  He has stable lymphedema.  Today, he denies symptoms of palpitations, chest pain, dizziness, presyncope, further syncope, or neurologic sequela.  The patient feels that he is tolerating medications without difficulties and is otherwise without complaint today.   Past Medical History  Diagnosis Date  . Second degree Mobitz II AV block     with syncope, s/p PPM  . Fabry's disease   . Lymph edema     lower legs - has wrapped at wound center 3x/week   . Hypertension   . Obesity   . Bifascicular block    Past Surgical History  Procedure Date  . Cardiac catheterization   . Pacemaker insertion 02/12/11    SJM implanted by Dr Rayann Heman for Mobitz II second degree AV block and syncope    Current Outpatient Prescriptions  Medication Sig Dispense Refill  . aspirin 81 MG tablet Take 81 mg by mouth daily.        . furosemide (LASIX) 20 MG tablet Take 20 mg by mouth 3 (three) times daily.       [name] Kitchen losartan (COZAAR) 25 MG tablet Take 25 mg by mouth 2 (two) times daily.          No Known Allergies  History   Social History  . Marital Status: Single    Spouse Name: N/A    Number of Children: N/A  . Years of Education: N/A   Occupational History  . Not on file.   Social History Main Topics  . Smoking status: Never Smoker   . Smokeless tobacco: Never Used  . Alcohol Use: No  . Drug Use: No  . Sexually Active:    Other Topics Concern  . Not on file   Social History Narrative  . No narrative on file    Physical Exam: Filed Vitals:   05/19/11 0950  BP: 128/88  Pulse: 63  Height: 5\' 9"  (1.753 m)  Weight: 278 lb (126.1 kg)    GEN- The patient is well appearing, alert  and oriented x 3 today.   Head- normocephalic, atraumatic Eyes-  Sclera clear, conjunctiva pink Ears- hearing intact Oropharynx- clear Neck- supple, no JVP Lymph- no cervical lymphadenopathy Lungs- Clear to ausculation bilaterally, normal work of breathing Chest- pacemaker pocket is well healed Heart- Regular rate and rhythm, no murmurs, rubs or gallops, PMI not laterally displaced GI- soft, NT, ND, + BS Extremities- no clubbing, cyanosis, +3 lymph edema  Pacemaker interrogation- reviewed in detail today,  See PACEART report  Assessment and Plan:

## 2011-05-19 NOTE — Assessment & Plan Note (Signed)
Stable Per Dr Tamala Julian

## 2011-05-19 NOTE — Patient Instructions (Signed)
Your physician wants you to follow-up in: 02/2012 with Dr Rayann Heman Dennis Bast will receive a reminder letter in the mail two months in advance. If you don't receive a letter, please call our office to schedule the follow-up appointment.

## 2011-05-27 ENCOUNTER — Other Ambulatory Visit (HOSPITAL_COMMUNITY): Payer: Self-pay | Admitting: *Deleted

## 2011-05-28 ENCOUNTER — Encounter (HOSPITAL_COMMUNITY): Payer: Self-pay

## 2011-05-28 ENCOUNTER — Encounter (HOSPITAL_COMMUNITY)
Admission: RE | Admit: 2011-05-28 | Discharge: 2011-05-28 | Disposition: A | Discharge status: Home / Self Care | Payer: 59 | Source: Ambulatory Visit | Attending: Nephrology | Admitting: Nephrology

## 2011-05-28 LAB — URINALYSIS, ROUTINE W REFLEX MICROSCOPIC
Bilirubin Urine: NEGATIVE
Glucose, UA: NEGATIVE mg/dL
Hgb urine dipstick: NEGATIVE
Ketones, ur: NEGATIVE mg/dL
Leukocytes, UA: NEGATIVE
Nitrite: NEGATIVE
Protein, ur: 100 mg/dL — AB
Specific Gravity, Urine: 1.012 (ref 1.005–1.030)
Urobilinogen, UA: 0.2 mg/dL (ref 0.0–1.0)
pH: 6 (ref 5.0–8.0)

## 2011-05-28 LAB — COMPREHENSIVE METABOLIC PANEL
ALT: 23 U/L (ref 0–53)
AST: 24 U/L (ref 0–37)
Albumin: 3.2 g/dL — ABNORMAL LOW (ref 3.5–5.2)
Alkaline Phosphatase: 71 U/L (ref 39–117)
BUN: 16 mg/dL (ref 6–23)
CO2: 29 mEq/L (ref 19–32)
Calcium: 10 mg/dL (ref 8.4–10.5)
Chloride: 99 mEq/L (ref 96–112)
Creatinine, Ser: 1.34 mg/dL (ref 0.50–1.35)
GFR calc Af Amer: 70 mL/min — ABNORMAL LOW (ref >90)
GFR calc non Af Amer: 60 mL/min — ABNORMAL LOW (ref >90)
Glucose, Bld: 159 mg/dL — ABNORMAL HIGH (ref 70–99)
Potassium: 4.6 mEq/L (ref 3.5–5.1)
Sodium: 138 mEq/L (ref 135–145)
Total Bilirubin: 0.3 mg/dL (ref 0.3–1.2)
Total Protein: 8.1 g/dL (ref 6.0–8.3)

## 2011-05-28 LAB — PHOSPHORUS: Phosphorus: 4.1 mg/dL (ref 2.3–4.6)

## 2011-05-28 LAB — URINE MICROSCOPIC-ADD ON

## 2011-05-28 MED ORDER — SODIUM CHLORIDE 0.9 % IV SOLN
115.0000 mg | INTRAVENOUS | Status: DC
  Administered 2011-05-28: 115 mg via INTRAVENOUS
  Filled 2011-05-28: qty 23

## 2011-05-28 MED ORDER — SODIUM CHLORIDE 0.9 % IV SOLN
INTRAVENOUS | Status: DC
  Administered 2011-05-28: 12:00:00 via INTRAVENOUS

## 2011-05-28 NOTE — Progress Notes (Signed)
Per Stanton Kidney, rn for dr fox (covering for [employer]) states  proceed c infusion_

## 2011-05-28 NOTE — Progress Notes (Signed)
Pt arrived to short stay for treatment  C/O nausea call placed to MD.mary c md  She will check c md and return call

## 2011-05-28 NOTE — Progress Notes (Signed)
Pt states took allegra prior to arrival

## 2011-06-08 HISTORY — PX: HERNIA REPAIR: SHX51

## 2011-06-11 ENCOUNTER — Encounter (HOSPITAL_COMMUNITY)
Admission: RE | Admit: 2011-06-11 | Discharge: 2011-06-11 | Disposition: A | Discharge status: Home / Self Care | Payer: 59 | Source: Ambulatory Visit | Attending: Nephrology | Admitting: Nephrology

## 2011-06-11 ENCOUNTER — Encounter (HOSPITAL_COMMUNITY): Payer: Self-pay

## 2011-06-11 DIAGNOSIS — E756 Lipid storage disorder, unspecified: Secondary | ICD-10-CM | POA: Insufficient documentation

## 2011-06-11 DIAGNOSIS — I10 Essential (primary) hypertension: Secondary | ICD-10-CM | POA: Insufficient documentation

## 2011-06-11 DIAGNOSIS — I890 Lymphedema, not elsewhere classified: Secondary | ICD-10-CM | POA: Insufficient documentation

## 2011-06-11 DIAGNOSIS — E669 Obesity, unspecified: Secondary | ICD-10-CM | POA: Insufficient documentation

## 2011-06-11 DIAGNOSIS — G4730 Sleep apnea, unspecified: Secondary | ICD-10-CM | POA: Insufficient documentation

## 2011-06-11 MED ORDER — SODIUM CHLORIDE 0.9 % IV SOLN
INTRAVENOUS | Status: DC
  Administered 2011-06-11: 11:00:00 via INTRAVENOUS

## 2011-06-11 MED ORDER — AGALSIDASE BETA 5 MG IV SOLR
115.0000 mg | INTRAVENOUS | Status: DC
  Administered 2011-06-11: 115 mg via INTRAVENOUS
  Filled 2011-06-11: qty 23

## 2011-06-25 ENCOUNTER — Encounter (HOSPITAL_COMMUNITY)
Admission: RE | Admit: 2011-06-25 | Discharge: 2011-06-25 | Disposition: A | Discharge status: Home / Self Care | Payer: 59 | Source: Ambulatory Visit | Attending: Nephrology | Admitting: Nephrology

## 2011-06-25 ENCOUNTER — Encounter (HOSPITAL_COMMUNITY): Payer: Self-pay

## 2011-06-25 LAB — COMPREHENSIVE METABOLIC PANEL
ALT: 25 U/L (ref 0–53)
AST: 24 U/L (ref 0–37)
Albumin: 3.2 g/dL — ABNORMAL LOW (ref 3.5–5.2)
Alkaline Phosphatase: 66 U/L (ref 39–117)
BUN: 18 mg/dL (ref 6–23)
CO2: 26 mEq/L (ref 19–32)
Calcium: 9.6 mg/dL (ref 8.4–10.5)
Chloride: 102 mEq/L (ref 96–112)
Creatinine, Ser: 1.27 mg/dL (ref 0.50–1.35)
GFR calc Af Amer: 75 mL/min — ABNORMAL LOW (ref >90)
GFR calc non Af Amer: 64 mL/min — ABNORMAL LOW (ref >90)
Glucose, Bld: 192 mg/dL — ABNORMAL HIGH (ref 70–99)
Potassium: 4.2 mEq/L (ref 3.5–5.1)
Sodium: 139 mEq/L (ref 135–145)
Total Bilirubin: 0.4 mg/dL (ref 0.3–1.2)
Total Protein: 7.7 g/dL (ref 6.0–8.3)

## 2011-06-25 LAB — CBC
HCT: 43.4 % (ref 39.0–52.0)
Hemoglobin: 14.9 g/dL (ref 13.0–17.0)
MCH: 30 pg (ref 26.0–34.0)
MCHC: 34.3 g/dL (ref 30.0–36.0)
MCV: 87.3 fL (ref 78.0–100.0)
Platelets: 171 10*3/uL (ref 150–400)
RBC: 4.97 MIL/uL (ref 4.22–5.81)
RDW: 12.8 % (ref 11.5–15.5)
WBC: 5.3 10*3/uL (ref 4.0–10.5)

## 2011-06-25 LAB — PHOSPHORUS: Phosphorus: 3.7 mg/dL (ref 2.3–4.6)

## 2011-06-25 LAB — URINALYSIS, ROUTINE W REFLEX MICROSCOPIC
Bilirubin Urine: NEGATIVE
Glucose, UA: NEGATIVE mg/dL
Hgb urine dipstick: NEGATIVE
Ketones, ur: NEGATIVE mg/dL
Leukocytes, UA: NEGATIVE
Nitrite: NEGATIVE
Protein, ur: 30 mg/dL — AB
Specific Gravity, Urine: 1.012 (ref 1.005–1.030)
Urobilinogen, UA: 0.2 mg/dL (ref 0.0–1.0)
pH: 6 (ref 5.0–8.0)

## 2011-06-25 LAB — DIFFERENTIAL
Basophils Absolute: 0 10*3/uL (ref 0.0–0.1)
Basophils Relative: 1 % (ref 0–1)
Eosinophils Absolute: 0.2 10*3/uL (ref 0.0–0.7)
Eosinophils Relative: 5 % (ref 0–5)
Lymphocytes Relative: 40 % (ref 12–46)
Lymphs Abs: 2.1 10*3/uL (ref 0.7–4.0)
Monocytes Absolute: 0.5 10*3/uL (ref 0.1–1.0)
Monocytes Relative: 9 % (ref 3–12)
Neutro Abs: 2.4 10*3/uL (ref 1.7–7.7)
Neutrophils Relative %: 46 % (ref 43–77)

## 2011-06-25 LAB — IRON AND TIBC
Iron: 148 ug/dL — ABNORMAL HIGH (ref 42–135)
Saturation Ratios: 56 % — ABNORMAL HIGH (ref 20–55)
TIBC: 263 ug/dL (ref 215–435)
UIBC: 115 ug/dL — ABNORMAL LOW (ref 125–400)

## 2011-06-25 LAB — LIPID PANEL
Cholesterol: 281 mg/dL — ABNORMAL HIGH (ref 0–200)
HDL: 48 mg/dL (ref >39)
LDL Cholesterol: 201 mg/dL — ABNORMAL HIGH (ref 0–99)
Total CHOL/HDL Ratio: 5.9 RATIO
Triglycerides: 162 mg/dL — ABNORMAL HIGH (ref <150)
VLDL: 32 mg/dL (ref 0–40)

## 2011-06-25 LAB — FERRITIN: Ferritin: 495 ng/mL — ABNORMAL HIGH (ref 22–322)

## 2011-06-25 LAB — URINE MICROSCOPIC-ADD ON

## 2011-06-25 MED ORDER — SODIUM CHLORIDE 0.9 % IV SOLN
INTRAVENOUS | Status: AC
  Administered 2011-06-25: 250 mL via INTRAVENOUS

## 2011-06-25 MED ORDER — SODIUM CHLORIDE 0.9 % IV SOLN
115.0000 mg | INTRAVENOUS | Status: AC
  Administered 2011-06-25: 115 mg via INTRAVENOUS
  Filled 2011-06-25: qty 23

## 2011-07-09 ENCOUNTER — Encounter (HOSPITAL_COMMUNITY)
Admission: RE | Admit: 2011-07-09 | Discharge: 2011-07-09 | Disposition: A | Discharge status: Home / Self Care | Payer: 59 | Source: Ambulatory Visit | Attending: Nephrology | Admitting: Nephrology

## 2011-07-09 DIAGNOSIS — I890 Lymphedema, not elsewhere classified: Secondary | ICD-10-CM | POA: Insufficient documentation

## 2011-07-09 DIAGNOSIS — E669 Obesity, unspecified: Secondary | ICD-10-CM | POA: Insufficient documentation

## 2011-07-09 DIAGNOSIS — E756 Lipid storage disorder, unspecified: Secondary | ICD-10-CM | POA: Insufficient documentation

## 2011-07-09 DIAGNOSIS — I10 Essential (primary) hypertension: Secondary | ICD-10-CM | POA: Insufficient documentation

## 2011-07-09 DIAGNOSIS — G4730 Sleep apnea, unspecified: Secondary | ICD-10-CM | POA: Insufficient documentation

## 2011-07-09 MED ORDER — SODIUM CHLORIDE 0.9 % IV SOLN
250.0000 mL | INTRAVENOUS | Status: DC
  Administered 2011-07-09: 250 mL via INTRAVENOUS

## 2011-07-09 MED ORDER — SODIUM CHLORIDE 0.9 % IV SOLN
115.0000 mg | INTRAVENOUS | Status: DC
  Administered 2011-07-09: 115 mg via INTRAVENOUS
  Filled 2011-07-09: qty 23

## 2011-07-21 ENCOUNTER — Other Ambulatory Visit (HOSPITAL_COMMUNITY): Payer: Self-pay | Admitting: *Deleted

## 2011-07-22 ENCOUNTER — Encounter (HOSPITAL_COMMUNITY)
Admission: RE | Admit: 2011-07-22 | Discharge: 2011-07-22 | Disposition: A | Discharge status: Home / Self Care | Payer: 59 | Source: Ambulatory Visit | Attending: Nephrology | Admitting: Nephrology

## 2011-07-22 DIAGNOSIS — E756 Lipid storage disorder, unspecified: Secondary | ICD-10-CM | POA: Insufficient documentation

## 2011-07-22 LAB — URINE MICROSCOPIC-ADD ON

## 2011-07-22 LAB — COMPREHENSIVE METABOLIC PANEL
ALT: 26 U/L (ref 0–53)
AST: 28 U/L (ref 0–37)
Albumin: 3.2 g/dL — ABNORMAL LOW (ref 3.5–5.2)
Alkaline Phosphatase: 71 U/L (ref 39–117)
BUN: 12 mg/dL (ref 6–23)
CO2: 25 mEq/L (ref 19–32)
Calcium: 9 mg/dL (ref 8.4–10.5)
Chloride: 104 mEq/L (ref 96–112)
Creatinine, Ser: 1.21 mg/dL (ref 0.50–1.35)
GFR calc Af Amer: 79 mL/min — ABNORMAL LOW (ref >90)
GFR calc non Af Amer: 68 mL/min — ABNORMAL LOW (ref >90)
Glucose, Bld: 217 mg/dL — ABNORMAL HIGH (ref 70–99)
Potassium: 4.1 mEq/L (ref 3.5–5.1)
Sodium: 141 mEq/L (ref 135–145)
Total Bilirubin: 0.3 mg/dL (ref 0.3–1.2)
Total Protein: 7.4 g/dL (ref 6.0–8.3)

## 2011-07-22 LAB — URINALYSIS, ROUTINE W REFLEX MICROSCOPIC
Bilirubin Urine: NEGATIVE
Glucose, UA: NEGATIVE mg/dL
Hgb urine dipstick: NEGATIVE
Ketones, ur: NEGATIVE mg/dL
Leukocytes, UA: NEGATIVE
Nitrite: NEGATIVE
Protein, ur: 100 mg/dL — AB
Specific Gravity, Urine: 1.011 (ref 1.005–1.030)
Urobilinogen, UA: 0.2 mg/dL (ref 0.0–1.0)
pH: 6 (ref 5.0–8.0)

## 2011-07-22 LAB — PHOSPHORUS: Phosphorus: 3.9 mg/dL (ref 2.3–4.6)

## 2011-07-22 MED ORDER — SODIUM CHLORIDE 0.9 % IV SOLN
115.0000 mg | INTRAVENOUS | Status: DC
  Administered 2011-07-22: 115 mg via INTRAVENOUS
  Filled 2011-07-22: qty 23

## 2011-07-22 MED ORDER — SODIUM CHLORIDE 0.9 % IV SOLN
250.0000 mL | INTRAVENOUS | Status: DC
  Administered 2011-07-22: 250 mL via INTRAVENOUS

## 2011-07-23 ENCOUNTER — Encounter (HOSPITAL_COMMUNITY): Payer: 59

## 2011-08-04 ENCOUNTER — Other Ambulatory Visit (HOSPITAL_COMMUNITY): Payer: Self-pay | Admitting: *Deleted

## 2011-08-06 ENCOUNTER — Encounter (HOSPITAL_COMMUNITY)
Admission: RE | Admit: 2011-08-06 | Discharge: 2011-08-06 | Disposition: A | Discharge status: Home / Self Care | Payer: 59 | Source: Ambulatory Visit | Attending: Nephrology | Admitting: Nephrology

## 2011-08-06 ENCOUNTER — Encounter (HOSPITAL_COMMUNITY): Payer: Self-pay

## 2011-08-06 DIAGNOSIS — E756 Lipid storage disorder, unspecified: Secondary | ICD-10-CM | POA: Insufficient documentation

## 2011-08-06 DIAGNOSIS — J189 Pneumonia, unspecified organism: Secondary | ICD-10-CM

## 2011-08-06 HISTORY — DX: Pneumonia, unspecified organism: J18.9

## 2011-08-06 MED ORDER — SODIUM CHLORIDE 0.9 % IV SOLN
115.0000 mg | INTRAVENOUS | Status: DC
  Administered 2011-08-06: 115 mg via INTRAVENOUS
  Filled 2011-08-06: qty 23

## 2011-08-06 MED ORDER — SODIUM CHLORIDE 0.9 % IV SOLN
INTRAVENOUS | Status: DC
  Administered 2011-08-06: 11:00:00 via INTRAVENOUS

## 2011-08-06 NOTE — Discharge Instructions (Signed)
Call your MD for any problems or questions.  Next appointments in Short Stay [phone] (call if you need to reschedule or cancel appointment) March 15, 9 am  March 29, 11am     Agalsidase Beta injection (Fabrazyme) What is this medicine?  AGALSIDASE BETA is used to replace an enzyme that is missing in patients with Fabry disease. It is not a cure.  This medicine may be used for other purposes; ask your health care provider or pharmacist if you have questions.  What should I tell my health care provider before I take this medicine?  They need to know if you have any of these conditions:  -heart disease  -an unusual or allergic reaction to agalsidase beta, mannitol, other medicines, foods, dyes, or preservatives  -pregnant or trying to get pregnant  -breast-feeding  How should I use this medicine?  This medicine is for infusion into a vein. It is given by a health care professional in a hospital or clinic setting.  Talk to your pediatrician regarding the use of this medicine in children. Special care may be needed.  Overdosage: If you think you have taken too much of this medicine contact a poison control center or emergency room at once.  NOTE: This medicine is only for you. Do not share this medicine with others.  What if I miss a dose?  It is important not to miss your dose. Call your doctor or health care professional if you are unable to keep an appointment.  What may interact with this medicine?  -amiodarone  -chloroquine  -gentamicin  -hydroxychloroquine  -monobenzone  This list may not describe all possible interactions. Give your health care provider a list of all the medicines, herbs, non-prescription drugs, or dietary supplements you use. Also tell them if you smoke, drink alcohol, or use illegal drugs. Some items may interact with your medicine.  What should I watch for while using this medicine?  Visit your doctor or health care professional for regular checks on your  progress. Tell your doctor or healthcare professional if your symptoms do not start to get better or if they get worse.  There is a registry for patients with Fabry disease. The registry is used to gather information about the disease and its effects. Talk to your health care provider if you would like to join the registry.  What side effects may I notice from receiving this medicine?  Side effects that you should report to your doctor or health care professional as soon as possible:  -allergic reactions like skin rash, itching or hives, swelling of the face, lips, or tongue  -breathing problems  -chest pain, tightness  -depression  -dizziness  -fast, irregular heart beat  -swelling of the arms or legs  Side effects that usually do not require medical attention (report to your doctor or health care professional if they continue or are bothersome):  -aches or pains  -anxiety  -fever or chills at the time of injection  -headache  -nausea, vomiting  -stomach pain, upset  This list may not describe all possible side effects. Call your doctor for medical advice about side effects. You may report side effects to FDA at 1-800-FDA-1088.  Where should I keep my medicine?  This drug is given in a hospital or clinic and will not be stored at home.  NOTE: This sheet is a summary. It may not cover all possible information. If you have questions about this medicine, talk to your doctor, pharmacist, or health care  provider.   2012, Elsevier/Gold Standard. (01/31/2006 12:25:00 PM)      Fabry's Disease  Fabry's disease is a fat storage disorder. It causes an abnormal deposit of a particular fatty substance in blood vessels walls. It is caused by a lack of an enzyme. The mother needs to be a carrier to produce an affected child. Her sons have a 44 percent chance of having the condition, and her daughters have a 85 percent chance of being a carrier. Some of the male carriers exhibit signs of the  condition, especially cloudiness of the cornea.  Symptoms males have include:  Eye problems.  Burning sensations (feeling) in their hands and feet that is worse with exercise and hot weather.  Small, raised, reddish-purple blemishes on their skin.  As they grow older, they may have impaired arterial circulation leading to early heart attacks and strokes. The kidneys become progressively involved. Many patients require kidney transplantation or dialysis. A number of patients have trouble with their digestive track. Symptoms of this include frequent bowel movements shortly after eating.  TREATMENT  The pain in the hands and feet usually responds to medicine such as Tegretol (carbamazepine) and dilantin. Gastrointestinal hyperactivity may be treated with metoclopramide or Lipisorb (a nutritional supplement). Recent experiments indicate that enzyme replacement is works for patients with this disorder.  PROGNOSIS  Patients with Fabry's disease usually survive into adulthood. They are at risk for:  Strokes.  Heart attacks.  Kidney damage.  The hope is that enzyme replacement and gene therapy will eliminate these difficulties.  Document Released: 05/14/2002 Document Revised: 02/03/2011 Document Reviewed: 09/28/2007  [facility] Patient Information 2012 [place].

## 2011-08-09 ENCOUNTER — Encounter: Payer: Self-pay | Admitting: Internal Medicine

## 2011-08-09 ENCOUNTER — Ambulatory Visit (INDEPENDENT_AMBULATORY_CARE_PROVIDER_SITE_OTHER): Payer: 59 | Admitting: Internal Medicine

## 2011-08-09 VITALS — BP 116/74 | HR 82 | Resp 19 | Ht 69.0 in | Wt 271.0 lb

## 2011-08-09 DIAGNOSIS — I441 Atrioventricular block, second degree: Secondary | ICD-10-CM

## 2011-08-09 DIAGNOSIS — I10 Essential (primary) hypertension: Secondary | ICD-10-CM

## 2011-08-09 DIAGNOSIS — I4891 Unspecified atrial fibrillation: Secondary | ICD-10-CM

## 2011-08-09 DIAGNOSIS — J449 Chronic obstructive pulmonary disease, unspecified: Secondary | ICD-10-CM | POA: Insufficient documentation

## 2011-08-09 DIAGNOSIS — R0600 Dyspnea, unspecified: Secondary | ICD-10-CM

## 2011-08-09 LAB — PACEMAKER DEVICE OBSERVATION
AL AMPLITUDE: 5 mv
AL IMPEDENCE PM: 512.5 Ohm
AL THRESHOLD: 0.625 V
ATRIAL PACING PM: 17
BAMS-0001: 150 {beats}/min
BAMS-0003: 70 {beats}/min
BATTERY VOLTAGE: 2.9629 V
DEVICE MODEL PM: 7267950
RV LEAD AMPLITUDE: 12 mv
RV LEAD IMPEDENCE PM: 475 Ohm
RV LEAD THRESHOLD: 0.875 V
VENTRICULAR PACING PM: 9.7

## 2011-08-09 NOTE — Assessment & Plan Note (Signed)
Unclear etiology Seems unlikely related to pacemaker as he only V paces 9% and has a preserved histogram SOB also seems out of proportion to afib burden.  Consider PFTs Further workup per Dr Tamala Julian.

## 2011-08-09 NOTE — Assessment & Plan Note (Signed)
Stable No change required today  

## 2011-08-09 NOTE — Assessment & Plan Note (Signed)
In afib 10% of the time CHADS2 score is 1 (HTN).  I have recommended that he consider pradaxa for stroke prevention. He will discuss this further with Dr Tamala Julian.  No changes today

## 2011-08-09 NOTE — Progress Notes (Signed)
PCP:  Elizabeth Palau, MD, MD Primary Cardiologist:  Dr Tamala Julian  The patient presents today for routine electrophysiology followup.  Since his pacemaker was implanted, the patient reports doing reasonably well.  He continues to have SOB.  He has stable lymphedema.  Today, he denies symptoms of palpitations, chest pain, dizziness, presyncope, further syncope, or neurologic sequela.  The patient feels that he is tolerating medications without difficulties and is otherwise without complaint today.   Past Medical History  Diagnosis Date  . Second degree Mobitz II AV block     with syncope, s/p PPM  . Fabry's disease   . Lymph edema     lower legs - has wrapped at wound center 3x/week   . Hypertension   . Obesity   . Bifascicular block    Past Surgical History  Procedure Date  . Cardiac catheterization   . Pacemaker insertion 02/12/11    SJM implanted by Dr Rayann Heman for Mobitz II second degree AV block and syncope    Current Outpatient Prescriptions  Medication Sig Dispense Refill  . aspirin 81 MG tablet Take 81 mg by mouth daily.        [name] Kitchen losartan (COZAAR) 25 MG tablet Take 25 mg by mouth 2 (two) times daily.        . furosemide (LASIX) 20 MG tablet Take 80 mg by mouth daily.         Allergies  Allergen Reactions  . Shellfish Allergy     History   Social History  . Marital Status: Single    Spouse Name: N/A    Number of Children: N/A  . Years of Education: N/A   Occupational History  . Not on file.   Social History Main Topics  . Smoking status: Never Smoker   . Smokeless tobacco: Never Used  . Alcohol Use: No  . Drug Use: No  . Sexually Active:    Other Topics Concern  . Not on file   Social History Narrative  . No narrative on file    Physical Exam: Filed Vitals:   08/09/11 1205  BP: 116/74  Pulse: 82  Resp: 19  Height: 5\' 9"  (1.753 m)  Weight: 271 lb (122.925 kg)    GEN- The patient is well appearing, alert and oriented x 3 today.   Head-  normocephalic, atraumatic Eyes-  Sclera clear, conjunctiva pink Ears- hearing intact Oropharynx- clear Neck- supple, no JVP Lymph- no cervical lymphadenopathy Lungs- Clear to ausculation bilaterally, normal work of breathing Chest- pacemaker pocket is well healed Heart- Regular rate and rhythm, no murmurs, rubs or gallops, PMI not laterally displaced GI- soft, NT, ND, + BS Extremities- no clubbing, cyanosis, +3 lymph edema  Pacemaker interrogation- reviewed in detail today,  See PACEART report  Assessment and Plan:

## 2011-08-09 NOTE — Patient Instructions (Addendum)
Your physician wants you to follow-up in: 9 months with Dr Allred You will receive a reminder letter in the mail two months in advance. If you don't receive a letter, please call our office to schedule the follow-up appointment.  

## 2011-08-09 NOTE — Assessment & Plan Note (Signed)
Normal pacemaker function See Pace Art report No changes today  V pacing only 9.7%

## 2011-08-20 ENCOUNTER — Encounter (HOSPITAL_COMMUNITY): Payer: Self-pay

## 2011-08-20 ENCOUNTER — Other Ambulatory Visit: Payer: Self-pay

## 2011-08-20 ENCOUNTER — Inpatient Hospital Stay (HOSPITAL_COMMUNITY)
Admission: EM | Admit: 2011-08-20 | Discharge: 2011-08-23 | DRG: 638 | Disposition: A | Discharge status: Home / Self Care | Payer: 59 | Attending: Internal Medicine | Admitting: Internal Medicine

## 2011-08-20 ENCOUNTER — Encounter (HOSPITAL_COMMUNITY)
Admission: RE | Admit: 2011-08-20 | Discharge: 2011-08-20 | Disposition: A | Discharge status: Home / Self Care | Payer: 59 | Source: Ambulatory Visit | Attending: Nephrology | Admitting: Nephrology

## 2011-08-20 ENCOUNTER — Emergency Department (HOSPITAL_COMMUNITY): Payer: 59

## 2011-08-20 DIAGNOSIS — I441 Atrioventricular block, second degree: Secondary | ICD-10-CM | POA: Diagnosis present

## 2011-08-20 DIAGNOSIS — E860 Dehydration: Secondary | ICD-10-CM | POA: Diagnosis present

## 2011-08-20 DIAGNOSIS — R799 Abnormal finding of blood chemistry, unspecified: Secondary | ICD-10-CM | POA: Diagnosis present

## 2011-08-20 DIAGNOSIS — N189 Chronic kidney disease, unspecified: Secondary | ICD-10-CM | POA: Diagnosis present

## 2011-08-20 DIAGNOSIS — R7989 Other specified abnormal findings of blood chemistry: Secondary | ICD-10-CM | POA: Diagnosis present

## 2011-08-20 DIAGNOSIS — E756 Lipid storage disorder, unspecified: Secondary | ICD-10-CM | POA: Insufficient documentation

## 2011-08-20 DIAGNOSIS — I10 Essential (primary) hypertension: Secondary | ICD-10-CM | POA: Diagnosis present

## 2011-08-20 DIAGNOSIS — I129 Hypertensive chronic kidney disease with stage 1 through stage 4 chronic kidney disease, or unspecified chronic kidney disease: Secondary | ICD-10-CM | POA: Diagnosis present

## 2011-08-20 DIAGNOSIS — E1101 Type 2 diabetes mellitus with hyperosmolarity with coma: Principal | ICD-10-CM | POA: Diagnosis present

## 2011-08-20 DIAGNOSIS — R739 Hyperglycemia, unspecified: Secondary | ICD-10-CM

## 2011-08-20 DIAGNOSIS — E1100 Type 2 diabetes mellitus with hyperosmolarity without nonketotic hyperglycemic-hyperosmolar coma (NKHHC): Secondary | ICD-10-CM | POA: Diagnosis present

## 2011-08-20 DIAGNOSIS — E7521 Fabry (-Anderson) disease: Secondary | ICD-10-CM | POA: Diagnosis present

## 2011-08-20 DIAGNOSIS — N179 Acute kidney failure, unspecified: Secondary | ICD-10-CM | POA: Diagnosis present

## 2011-08-20 DIAGNOSIS — Z950 Presence of cardiac pacemaker: Secondary | ICD-10-CM

## 2011-08-20 DIAGNOSIS — I4891 Unspecified atrial fibrillation: Secondary | ICD-10-CM | POA: Diagnosis present

## 2011-08-20 DIAGNOSIS — N289 Disorder of kidney and ureter, unspecified: Secondary | ICD-10-CM

## 2011-08-20 LAB — COMPREHENSIVE METABOLIC PANEL
ALT: 30 U/L (ref 0–53)
AST: 34 U/L (ref 0–37)
Albumin: 3.7 g/dL (ref 3.5–5.2)
Alkaline Phosphatase: 101 U/L (ref 39–117)
BUN: 35 mg/dL — ABNORMAL HIGH (ref 6–23)
CO2: 24 mEq/L (ref 19–32)
Calcium: 9.2 mg/dL (ref 8.4–10.5)
Chloride: 89 mEq/L — ABNORMAL LOW (ref 96–112)
Creatinine, Ser: 2.24 mg/dL — ABNORMAL HIGH (ref 0.50–1.35)
GFR calc Af Amer: 38 mL/min — ABNORMAL LOW (ref >90)
GFR calc non Af Amer: 32 mL/min — ABNORMAL LOW (ref >90)
Glucose, Bld: 907 mg/dL (ref 70–99)
Potassium: 4.7 mEq/L (ref 3.5–5.1)
Sodium: 128 mEq/L — ABNORMAL LOW (ref 135–145)
Total Bilirubin: 0.6 mg/dL (ref 0.3–1.2)
Total Protein: 8.6 g/dL — ABNORMAL HIGH (ref 6.0–8.3)

## 2011-08-20 LAB — CBC
HCT: 46.5 % (ref 39.0–52.0)
Hemoglobin: 16.6 g/dL (ref 13.0–17.0)
MCH: 30.6 pg (ref 26.0–34.0)
MCHC: 35.7 g/dL (ref 30.0–36.0)
MCV: 85.8 fL (ref 78.0–100.0)
Platelets: 169 10*3/uL (ref 150–400)
RBC: 5.42 MIL/uL (ref 4.22–5.81)
RDW: 12.4 % (ref 11.5–15.5)
WBC: 11.7 10*3/uL — ABNORMAL HIGH (ref 4.0–10.5)

## 2011-08-20 LAB — URINALYSIS, ROUTINE W REFLEX MICROSCOPIC
Bilirubin Urine: NEGATIVE
Glucose, UA: >1000 mg/dL — AB
Hgb urine dipstick: NEGATIVE
Ketones, ur: NEGATIVE mg/dL
Leukocytes, UA: NEGATIVE
Nitrite: NEGATIVE
Protein, ur: NEGATIVE mg/dL
Specific Gravity, Urine: 1.028 (ref 1.005–1.030)
Urobilinogen, UA: 0.2 mg/dL (ref 0.0–1.0)
pH: 5.5 (ref 5.0–8.0)

## 2011-08-20 LAB — DIFFERENTIAL
Basophils Absolute: 0 10*3/uL (ref 0.0–0.1)
Basophils Relative: 0 % (ref 0–1)
Eosinophils Absolute: 0.1 10*3/uL (ref 0.0–0.7)
Eosinophils Relative: 1 % (ref 0–5)
Lymphocytes Relative: 29 % (ref 12–46)
Lymphs Abs: 3.4 10*3/uL (ref 0.7–4.0)
Monocytes Absolute: 1 10*3/uL (ref 0.1–1.0)
Monocytes Relative: 8 % (ref 3–12)
Neutro Abs: 7.2 10*3/uL (ref 1.7–7.7)
Neutrophils Relative %: 62 % (ref 43–77)

## 2011-08-20 LAB — BLOOD GAS, ARTERIAL
Acid-base deficit: 2.7 mmol/L — ABNORMAL HIGH (ref 0.0–2.0)
Bicarbonate: 22 mEq/L (ref 20.0–24.0)
Drawn by: 257701
FIO2: 0.21 %
O2 Saturation: 94.5 %
Patient temperature: 98.6
TCO2: 19.3 mmol/L (ref 0–100)
pCO2 arterial: 40.1 mmHg (ref 35.0–45.0)
pH, Arterial: 7.358 (ref 7.350–7.450)
pO2, Arterial: 76.5 mmHg — ABNORMAL LOW (ref 80.0–100.0)

## 2011-08-20 LAB — GLUCOSE, CAPILLARY
Glucose-Capillary: >600 mg/dL (ref 70–99)
Glucose-Capillary: >600 mg/dL (ref 70–99)
Glucose-Capillary: >600 mg/dL (ref 70–99)
Glucose-Capillary: >600 mg/dL (ref 70–99)
Glucose-Capillary: 316 mg/dL — ABNORMAL HIGH (ref 70–99)

## 2011-08-20 LAB — CARDIAC PANEL(CRET KIN+CKTOT+MB+TROPI)
CK, MB: 13.6 ng/mL (ref 0.3–4.0)
Relative Index: 1.1 (ref 0.0–2.5)
Total CK: 1200 U/L — ABNORMAL HIGH (ref 7–232)
Troponin I: 0.41 ng/mL (ref <0.30)

## 2011-08-20 LAB — URINE MICROSCOPIC-ADD ON: Urine-Other: NONE SEEN

## 2011-08-20 MED ORDER — HEPARIN SODIUM (PORCINE) 5000 UNIT/ML IJ SOLN
5000.0000 [IU] | Freq: Three times a day (TID) | INTRAMUSCULAR | Status: DC
  Administered 2011-08-21 – 2011-08-23 (×8): 5000 [IU] via SUBCUTANEOUS
  Filled 2011-08-20 (×14): qty 1

## 2011-08-20 MED ORDER — SODIUM CHLORIDE 0.9 % IJ SOLN: 3.0000 mL | INTRAMUSCULAR | Status: DC | PRN

## 2011-08-20 MED ORDER — SODIUM CHLORIDE 0.9 % IV SOLN: INTRAVENOUS | Status: DC

## 2011-08-20 MED ORDER — SODIUM CHLORIDE 0.9 % IV BOLUS (SEPSIS)
500.0000 mL | Freq: Once | INTRAVENOUS | Status: AC
  Administered 2011-08-20: 500 mL via INTRAVENOUS

## 2011-08-20 MED ORDER — SODIUM CHLORIDE 0.9 % IV SOLN
Freq: Once | INTRAVENOUS | Status: AC
  Administered 2011-08-20: 19:00:00 via INTRAVENOUS

## 2011-08-20 MED ORDER — POTASSIUM CHLORIDE 10 MEQ/100ML IV SOLN: 10.0000 meq | INTRAVENOUS | Status: AC

## 2011-08-20 MED ORDER — INFLUENZA VIRUS VACC SPLIT PF IM SUSP
0.5000 mL | INTRAMUSCULAR | Status: AC
  Administered 2011-08-21: 0.5 mL via INTRAMUSCULAR
  Filled 2011-08-20: qty 0.5

## 2011-08-20 MED ORDER — DEXTROSE 50 % IV SOLN: 25.0000 mL | INTRAVENOUS | Status: DC | PRN

## 2011-08-20 MED ORDER — HEPARIN SODIUM (PORCINE) 5000 UNIT/ML IJ SOLN: 5000.0000 [IU] | Freq: Three times a day (TID) | INTRAMUSCULAR | Status: DC

## 2011-08-20 MED ORDER — DIPHENHYDRAMINE HCL 50 MG/ML IJ SOLN
25.0000 mg | Freq: Once | INTRAMUSCULAR | Status: AC
  Administered 2011-08-20: 25 mg via INTRAVENOUS

## 2011-08-20 MED ORDER — POTASSIUM CHLORIDE IN NACL 20-0.9 MEQ/L-% IV SOLN
INTRAVENOUS | Status: DC
  Administered 2011-08-20: 23:00:00 via INTRAVENOUS
  Filled 2011-08-20 (×5): qty 1000

## 2011-08-20 MED ORDER — LORATADINE 10 MG PO TABS
10.0000 mg | ORAL_TABLET | Freq: Every day | ORAL | Status: DC
  Administered 2011-08-23: 10 mg via ORAL
  Filled 2011-08-20 (×4): qty 1

## 2011-08-20 MED ORDER — PNEUMOCOCCAL VAC POLYVALENT 25 MCG/0.5ML IJ INJ
0.5000 mL | INJECTION | INTRAMUSCULAR | Status: AC
  Administered 2011-08-21: 0.5 mL via INTRAMUSCULAR
  Filled 2011-08-20: qty 0.5

## 2011-08-20 MED ORDER — ACETAMINOPHEN 500 MG PO TABS
500.0000 mg | ORAL_TABLET | Freq: Four times a day (QID) | ORAL | Status: DC | PRN
  Administered 2011-08-20: 500 mg via ORAL

## 2011-08-20 MED ORDER — INSULIN REGULAR HUMAN 100 UNIT/ML IJ SOLN
INTRAMUSCULAR | Status: DC
  Administered 2011-08-21: 2.1 [IU]/h via INTRAVENOUS
  Filled 2011-08-20 (×2): qty 1

## 2011-08-20 MED ORDER — SODIUM CHLORIDE 0.9 % IV SOLN: 250.0000 mL | INTRAVENOUS | Status: DC | PRN

## 2011-08-20 MED ORDER — SODIUM CHLORIDE 0.9 % IV SOLN
INTRAVENOUS | Status: DC
  Administered 2011-08-20: 17:00:00 via INTRAVENOUS
  Filled 2011-08-20: qty 1

## 2011-08-20 MED ORDER — DEXTROSE-NACL 5-0.45 % IV SOLN
INTRAVENOUS | Status: DC
  Administered 2011-08-20 – 2011-08-22 (×3): via INTRAVENOUS

## 2011-08-20 MED ORDER — ASPIRIN 81 MG PO CHEW
81.0000 mg | CHEWABLE_TABLET | Freq: Every day | ORAL | Status: DC
  Administered 2011-08-21 – 2011-08-23 (×3): 81 mg via ORAL
  Filled 2011-08-20 (×4): qty 1

## 2011-08-20 MED ORDER — SODIUM CHLORIDE 0.9 % IV SOLN
115.0000 mg | INTRAVENOUS | Status: DC
  Administered 2011-08-20: 115 mg via INTRAVENOUS
  Filled 2011-08-20: qty 23

## 2011-08-20 MED ORDER — SODIUM CHLORIDE 0.9 % IJ SOLN
3.0000 mL | Freq: Two times a day (BID) | INTRAMUSCULAR | Status: DC
  Administered 2011-08-21 – 2011-08-23 (×3): 3 mL via INTRAVENOUS

## 2011-08-20 NOTE — ED Notes (Signed)
IV team at bedside 

## 2011-08-20 NOTE — H&P (Signed)
PCP:  Elizabeth Palau, MD, MD   DOA:  08/20/2011  2:56 PM  Chief Complaint:  Feeling generalized weakness and poor appetite for 1 week  HPI: 51 y/o obese AA male with hx of pacemaker for syncope due to [facility] type 2 block, HTN, fabry's disease ( on fibrozyme every 2 weeks), CKD with baseline creatinine around 1.3-1.4, presented to his nephrologist ( Dr Daphane Shepherd) for routine visit and c/o feeling fatigued with poor appetite for about 1-2 weeks. He also mentions having to get up frequently in the night to urinate. Also has been having some blurry vision of same duration. Labs drown in the office showed glucose >900 and acute on CKD and patient was sent to the ED. Patient denies any chest pan, palpitations, shortness of breath, nausea , vomiting, abdominal pain, dysuria, diarrhea or neuropathic symptoms. He informs to have lost about 20 lbs since he last checked his weight few months back.  Labs sent from ED showed fsg >600 with AG of 15 and no urinary ketones. Patient was lao noted for acute kidney injury and mild troponin leak. EKG showed a paced rhythm. [place] cardiology was called from ED and recommended monitoring of troponins and re consult if remained elevated or any symptoms.   Allergies: Allergies  Allergen Reactions  . Shellfish Allergy     Prior to Admission medications   Medication Sig Start Date End Date Taking? Authorizing Provider  Agalsidase beta (FABRAZYME IV) Inject into the vein every 14 (fourteen) days.   Yes Historical Provider, MD  aspirin 325 MG tablet Take 325 mg by mouth once.   Yes Historical Provider, MD  aspirin 81 MG tablet Take 81 mg by mouth daily.     Yes Historical Provider, MD  fexofenadine (ALLEGRA) 60 MG tablet Take 60 mg by mouth once.   Yes Historical Provider, MD  losartan (COZAAR) 25 MG tablet Take 25 mg by mouth 2 (two) times daily.   Yes Historical Provider, MD  spironolactone (ALDACTONE) 25 MG tablet Take 25 mg by mouth 2 (two) times daily.    Yes Historical Provider, MD    Past Medical History  Diagnosis Date  . Second degree Mobitz II AV block     with syncope, s/p PPM  . Fabry's disease   . Lymph edema     lower legs - has wrapped at wound center 3x/week   . Hypertension   . Obesity   . Bifascicular block   . Fabry's disease     Past Surgical History  Procedure Date  . Cardiac catheterization   . Pacemaker insertion 02/12/11    SJM implanted by Dr Rayann Heman for Mobitz II second degree AV block and syncope    Social History:  reports that he has never smoked. He has never used smokeless tobacco. He reports that he does not drink alcohol or use illicit drugs.  History reviewed. Both mother and brother have diabetes  Review of Systems:  Constitutional: Denies fever, chills, diaphoresis, positive for appetite change and fatigue.  HEENT: Denies photophobia, eye pain, redness, hearing loss, ear pain, congestion, sore throat, rhinorrhea, sneezing, mouth sores, trouble swallowing, neck pain, neck stiffness and tinnitus.   Respiratory: Denies SOB, DOE, cough, chest tightness,  and wheezing.   Cardiovascular: Denies chest pain, palpitations and leg swelling.  Gastrointestinal: Denies nausea, vomiting, abdominal pain, diarrhea, constipation, blood in stool and abdominal distention.  Genitourinary: increased urinary frequency, Denies dysuria, urgency, frequency, hematuria, flank pain and difficulty urinating.  Musculoskeletal: Denies myalgias, back pain,  joint swelling, arthralgias and gait problem.  Skin: Denies pallor, rash and wound.  Neurological: Denies dizziness, seizures, syncope, weakness, light-headedness, numbness and headaches. c/o some blurry vision. Hematological: Denies adenopathy. Easy bruising, personal or family bleeding history  Psychiatric/Behavioral: Denies suicidal ideation, mood changes, confusion, nervousness, sleep disturbance and agitation   Physical Exam:  Filed Vitals:   08/20/11 1945 08/20/11 2000  08/20/11 2015 08/20/11 2030  BP:  100/70    Pulse:    70  Temp:      TempSrc:      Resp: 19 16 17 18   Height:      Weight:      SpO2:    99%    Constitutional: Vital signs reviewed.  Patient is a well-developed and well-nourished in no acute distress and cooperative with exam. Alert and oriented x3.  Head: Normocephalic and atraumatic Ear: TM normal bilaterally Mouth: no erythema or exudates, MMM Eyes: PERRL, EOMI, conjunctivae normal, No scleral icterus.  Neck: Supple, Trachea midline normal ROM, No JVD, mass, thyromegaly, or carotid bruit present.  Cardiovascular: RRR, S1 normal, S2 normal, no MRG, pulses symmetric and intact bilaterally. Pacemaker in place Pulmonary/Chest: CTAB, no wheezes, rales, or rhonchi Abdominal: Soft. Non-tender, non-distended, bowel sounds are normal, no masses, organomegaly, or guarding present.  GU: no CVA tenderness Musculoskeletal: No joint deformities, erythema, or stiffness, ROM full and no nontender Ext: trace edema,  no cyanosis, pulses palpable bilaterally (DP and PT) Hematology: no cervical, inginal, or axillary adenopathy.  Neurological: A&O x3, Strenght is normal and symmetric bilaterally, cranial nerve II-XII are grossly intact, no focal motor deficit, sensory intact to light touch bilaterally.  Skin: Warm, dry and intact. No rash, cyanosis, or clubbing.  Psychiatric: Normal mood and affect. speech and behavior is normal. Judgment and thought content normal. Cognition and memory are normal.   Labs on Admission:  Results for orders placed during the hospital encounter of 08/20/11 (from the past 48 hour(s))  CBC     Status: Abnormal   Collection Time   08/20/11  4:00 PM      Component Value Range Comment   WBC 11.7 (*) 4.0 - 10.5 (K/uL)    RBC 5.42  4.22 - 5.81 (MIL/uL)    Hemoglobin 16.6  13.0 - 17.0 (g/dL)    HCT 46.5  39.0 - 52.0 (%)    MCV 85.8  78.0 - 100.0 (fL)    MCH 30.6  26.0 - 34.0 (pg)    MCHC 35.7  30.0 - 36.0 (g/dL)    RDW  12.4  11.5 - 15.5 (%)    Platelets 169  150 - 400 (K/uL)   DIFFERENTIAL     Status: Normal   Collection Time   08/20/11  4:00 PM      Component Value Range Comment   Neutrophils Relative 62  43 - 77 (%)    Neutro Abs 7.2  1.7 - 7.7 (K/uL)    Lymphocytes Relative 29  12 - 46 (%)    Lymphs Abs 3.4  0.7 - 4.0 (K/uL)    Monocytes Relative 8  3 - 12 (%)    Monocytes Absolute 1.0  0.1 - 1.0 (K/uL)    Eosinophils Relative 1  0 - 5 (%)    Eosinophils Absolute 0.1  0.0 - 0.7 (K/uL)    Basophils Relative 0  0 - 1 (%)    Basophils Absolute 0.0  0.0 - 0.1 (K/uL)   COMPREHENSIVE METABOLIC PANEL     Status: Abnormal  Collection Time   08/20/11  4:00 PM      Component Value Range Comment   Sodium 128 (*) 135 - 145 (mEq/L)    Potassium 4.7  3.5 - 5.1 (mEq/L)    Chloride 89 (*) 96 - 112 (mEq/L)    CO2 24  19 - 32 (mEq/L)    Glucose, Bld 907 (*) 70 - 99 (mg/dL)    BUN 35 (*) 6 - 23 (mg/dL)    Creatinine, Ser 2.24 (*) 0.50 - 1.35 (mg/dL)    Calcium 9.2  8.4 - 10.5 (mg/dL)    Total Protein 8.6 (*) 6.0 - 8.3 (g/dL)    Albumin 3.7  3.5 - 5.2 (g/dL)    AST 34  0 - 37 (U/L)    ALT 30  0 - 53 (U/L)    Alkaline Phosphatase 101  39 - 117 (U/L)    Total Bilirubin 0.6  0.3 - 1.2 (mg/dL)    GFR calc non Af Amer 32 (*) >90 (mL/min)    GFR calc Af Amer 38 (*) >90 (mL/min)   GLUCOSE, CAPILLARY     Status: Abnormal   Collection Time   08/20/11  4:13 PM      Component Value Range Comment   Glucose-Capillary >600 (*) 70 - 99 (mg/dL)    Comment 1 Documented in Chart      Comment 2 Notify RN     CARDIAC PANEL(CRET KIN+CKTOT+MB+TROPI)     Status: Abnormal   Collection Time   08/20/11  4:30 PM      Component Value Range Comment   Total CK 1200 (*) 7 - 232 (U/L)    CK, MB 13.6 (*) 0.3 - 4.0 (ng/mL)    Troponin I 0.41 (*) <0.30 (ng/mL)    Relative Index 1.1  0.0 - 2.5    BLOOD GAS, ARTERIAL     Status: Abnormal   Collection Time   08/20/11  6:40 PM      Component Value Range Comment   FIO2 0.21      pH,  Arterial 7.358  7.350 - 7.450     pCO2 arterial 40.1  35.0 - 45.0 (mmHg)    pO2, Arterial 76.5 (*) 80.0 - 100.0 (mmHg)    Bicarbonate 22.0  20.0 - 24.0 (mEq/L)    TCO2 19.3  0 - 100 (mmol/L)    Acid-base deficit 2.7 (*) 0.0 - 2.0 (mmol/L)    O2 Saturation 94.5      Patient temperature 98.6      Collection site RIGHT RADIAL      Drawn by [id number]      Sample type ARTERIAL DRAW      Allens test (pass/fail) PASS  PASS    GLUCOSE, CAPILLARY     Status: Abnormal   Collection Time   08/20/11  6:52 PM      Component Value Range Comment   Glucose-Capillary >600 (*) 70 - 99 (mg/dL)    Comment 1 Documented in Chart      Comment 2 Notify RN     GLUCOSE, CAPILLARY     Status: Abnormal   Collection Time   08/20/11  8:06 PM      Component Value Range Comment   Glucose-Capillary >600 (*) 70 - 99 (mg/dL)    Comment 1 Documented in Chart      Comment 2 Notify RN       Radiological Exams on Admission: CXR : NAD  Assessment/Plan 51 Y/O AA male with fabry's disease, HTN,  s/p pacemaker for syncope due to mobitz type 2 presented with 1 -2 weeks of fatigue and poor appetite with increased urinary frequency and found to be in non ketotic hyperglycemia.    *Type 2 diabetes mellitus with hyperosmolar nonketotic hyperglycemia New onset diabetes Admit to stepdown  started on insulin drip with glucose stabilizer protocol  will check fsg q1 hr with frquent monitoering of BMET  cont aggressive IV hydration with NS at 200 cc/hr. Was given IV bolus in ED AG of 15 noted, no urinary ketones and normal PH which suggests against DKA Check urine and serum osm Check HbA1C Diabetic educator consult    Acute on chronic renal insufficiency Likely in the setting of dehydration continue with aggressive hydration and monitor   Troponin level elevated Patient without chest pain or EKG changes  possibyl demand ischemia in the setting of hyperglycemia  will monitor CE closely. [place] cardiology informed from ED  and recommended to follow CE and re consult if remained elevated. Patient did have elevated troponing during his last hospitalization for syncopy and was thought perhaps related to endothelial injury from his fabry's disease. As per cardiology note ,he had a cath done without CAD. Will follow with serial CE and monitor in tele   Fabry disease Gets fibrozyme every 2 weeks   Hypertension Stable , cont home meds   Mobitz (type) II atrioventricular block S/p  cardiac pacemaker   DVT prophylaxis  Diet: diabetic   Full code  Time Spent on Admission: 55 minutes  Kashara Blocher 08/20/2011, 8:41 PM

## 2011-08-20 NOTE — Discharge Instructions (Signed)
Agalsidase Beta injection What is this medicine? AGALSIDASE BETA is used to replace an enzyme that is missing in patients with Fabry disease. It is not a cure. This medicine may be used for other purposes; ask your health care provider or pharmacist if you have questions. What should I tell my health care provider before I take this medicine? They need to know if you have any of these conditions: -heart disease -an unusual or allergic reaction to agalsidase beta, mannitol, other medicines, foods, dyes, or preservatives -pregnant or trying to get pregnant -breast-feeding How should I use this medicine? This medicine is for infusion into a vein. It is given by a health care professional in a hospital or clinic setting. Talk to your pediatrician regarding the use of this medicine in children. Special care may be needed. Overdosage: If you think you have taken too much of this medicine contact a poison control center or emergency room at once. NOTE: This medicine is only for you. Do not share this medicine with others. What if I miss a dose? It is important not to miss your dose. Call your doctor or health care professional if you are unable to keep an appointment. What may interact with this medicine? -amiodarone -chloroquine -gentamicin -hydroxychloroquine -monobenzone This list may not describe all possible interactions. Give your health care provider a list of all the medicines, herbs, non-prescription drugs, or dietary supplements you use. Also tell them if you smoke, drink alcohol, or use illegal drugs. Some items may interact with your medicine. What should I watch for while using this medicine? Visit your doctor or health care professional for regular checks on your progress. Tell your doctor or healthcare professional if your symptoms do not start to get better or if they get worse. There is a registry for patients with Fabry disease. The registry is used to gather information about  the disease and its effects. Talk to your health care provider if you would like to join the registry. What side effects may I notice from receiving this medicine? Side effects that you should report to your doctor or health care professional as soon as possible: -allergic reactions like skin rash, itching or hives, swelling of the face, lips, or tongue -breathing problems -chest pain, tightness -depression -dizziness -fast, irregular heart beat -swelling of the arms or legs Side effects that usually do not require medical attention (report to your doctor or health care professional if they continue or are bothersome): -aches or pains -anxiety -fever or chills at the time of injection -headache -nausea, vomiting -stomach pain, upset This list may not describe all possible side effects. Call your doctor for medical advice about side effects. You may report side effects to FDA at 1-800-FDA-1088. Where should I keep my medicine? This drug is given in a hospital or clinic and will not be stored at home. NOTE: This sheet is a summary. It may not cover all possible information. If you have questions about this medicine, talk to your doctor, pharmacist, or health care provider.  2012, Elsevier/Gold Standard. (01/31/2006 12:25:00 PM) 

## 2011-08-20 NOTE — Progress Notes (Signed)
Pt taken to ED via w/c (diane,charge nurse notified by Fransisca Kaufmann)

## 2011-08-20 NOTE — ED Notes (Signed)
Insulin titrated to 10.8 per glucostabilizer suggestion of CBG over 600.

## 2011-08-20 NOTE — Progress Notes (Signed)
Pt arrived to short stay about 1and 1/2 hours late. States he is sick c nausea,diarhea ,decreased appetite, not taking solid foods in past 5-6 days, and also c/o itching feet.  Call placed to dr Sheela Stack ,awaiting return call

## 2011-08-20 NOTE — ED Notes (Signed)
Insulin Drip increased to 16.2 per glucostabilizer. CBG still over 600. Admitting MD at bedside.

## 2011-08-20 NOTE — ED Provider Notes (Signed)
History     CSN: [id number]  Arrival date & time 08/20/11  1452   First MD Initiated Contact with Patient 08/20/11 1533      Chief Complaint  Patient presents with  . Hyperglycemia    was at short stay.  CBG >900  . Fatigue    x 4 days.  . Diarrhea    x 4 days.    (Consider location/radiation/quality/duration/timing/severity/associated sxs/prior treatment) HPI Pt has had 1 week of generalized weakness, decreased urination, dry cough. Seen by PCP and sent to ED for elevated blood sugar. No history of DM, fever, CP, SOB, abd pain.  Past Medical History  Diagnosis Date  . Second degree Mobitz II AV block     with syncope, s/p PPM  . Fabry's disease   . Lymph edema     lower legs - has wrapped at wound center 3x/week   . Hypertension   . Obesity   . Bifascicular block   . Fabry's disease     Past Surgical History  Procedure Date  . Cardiac catheterization   . Pacemaker insertion 02/12/11    SJM implanted by Dr Rayann Heman for Mobitz II second degree AV block and syncope    No family history on file.  History  Substance Use Topics  . Smoking status: Never Smoker   . Smokeless tobacco: Never Used  . Alcohol Use: No      Review of Systems  Constitutional: Positive for fatigue. Negative for fever and chills.  Respiratory: Positive for cough. Negative for chest tightness, shortness of breath and wheezing.   Cardiovascular: Negative for chest pain, palpitations and leg swelling.  Gastrointestinal: Positive for diarrhea. Negative for nausea, vomiting and abdominal pain.  Genitourinary: Negative for dysuria and frequency.  Musculoskeletal: Negative for myalgias, back pain and arthralgias.  Skin: Negative for pallor, rash and wound.  Neurological: Positive for weakness. Negative for dizziness, numbness and headaches.    Allergies  Shellfish allergy  Home Medications   Current Outpatient Rx  Name Route Sig Dispense Refill  . FABRAZYME IV Intravenous Inject into the  vein every 14 (fourteen) days.    . ASPIRIN 325 MG PO TABS Oral Take 325 mg by mouth once.    . ASPIRIN 81 MG PO TABS Oral Take 81 mg by mouth daily.      [name] Kitchen FEXOFENADINE HCL 60 MG PO TABS Oral Take 60 mg by mouth once.    [name] Kitchen LOSARTAN POTASSIUM 25 MG PO TABS Oral Take 25 mg by mouth 2 (two) times daily.    [name] Kitchen SPIRONOLACTONE 25 MG PO TABS Oral Take 25 mg by mouth 2 (two) times daily.      BP 107/77  Pulse 69  Temp(Src) 97.5 F (36.4 C) (Oral)  Resp 16  Ht 5\' 9"  (1.753 m)  Wt 251 lb (113.853 kg)  BMI 37.07 kg/m2  SpO2 99%  Physical Exam  Nursing note and vitals reviewed. Constitutional: He is oriented to person, place, and time. He appears well-developed and well-nourished. No distress.  HENT:  Head: Normocephalic and atraumatic.       Dry MM  Eyes: EOM are normal. Pupils are equal, round, and reactive to light.  Neck: Normal range of motion. Neck supple.  Cardiovascular: Normal rate and regular rhythm.   Pulmonary/Chest: Effort normal and breath sounds normal. No respiratory distress. He has no wheezes. He has no rales.  Abdominal: Soft. Bowel sounds are normal. He exhibits no mass. There is no tenderness. There is no rebound  and no guarding.  Musculoskeletal: Normal range of motion. He exhibits edema (2+ edema present L>R. ). He exhibits no tenderness.  Neurological: He is alert and oriented to person, place, and time.       5/5 motor, sensation intact  Skin: Skin is warm and dry. No rash noted. No erythema.  Psychiatric: He has a normal mood and affect. His behavior is normal.    ED Course  Procedures (including critical care time)  Labs Reviewed  CBC - Abnormal; Notable for the following:    WBC 11.7 (*)    All other components within normal limits  COMPREHENSIVE METABOLIC PANEL - Abnormal; Notable for the following:    Sodium 128 (*)    Chloride 89 (*)    Glucose, Bld 907 (*)    BUN 35 (*)    Creatinine, Ser 2.24 (*)    Total Protein 8.6 (*)    GFR calc non Af Amer  32 (*)    GFR calc Af Amer 38 (*)    All other components within normal limits  CARDIAC PANEL(CRET KIN+CKTOT+MB+TROPI) - Abnormal; Notable for the following:    Total CK 1200 (*)    CK, MB 13.6 (*)    Troponin I 0.41 (*)    All other components within normal limits  GLUCOSE, CAPILLARY - Abnormal; Notable for the following:    Glucose-Capillary >600 (*)    All other components within normal limits  BLOOD GAS, ARTERIAL - Abnormal; Notable for the following:    pO2, Arterial 76.5 (*)    Acid-base deficit 2.7 (*)    All other components within normal limits  GLUCOSE, CAPILLARY - Abnormal; Notable for the following:    Glucose-Capillary >600 (*)    All other components within normal limits  DIFFERENTIAL  URINALYSIS, ROUTINE W REFLEX MICROSCOPIC   Dg Chest 2 View  08/20/2011  *RADIOLOGY REPORT*  Clinical Data: , chest pain, shortness of breath  CHEST - 2 VIEW  Comparison: 03/10/2011  Findings: Left subclavian transvenous pacemaker stable. Lungs clear.  Heart size and pulmonary vascularity normal.  No effusion. Visualized bones unremarkable.  IMPRESSION: No acute disease  Original Report Authenticated By: Dillard Cannon III, M.D.     1. Hyperglycemia   2. Dehydration   3. Renal insufficiency      Date: 08/20/2011  Rate: 70  Rhythm: Vent paced  QRS Axis: indeterminate  Intervals: vent paced  ST/T Wave abnormalities: nonspecific T wave changes  Conduction Disutrbances:none  Narrative Interpretation:   Old EKG Reviewed: changes noted    MDM  [facility] aware of abnormal cardiac markers and suggest following markers and if needed reconsult.    Discussed with [facility] will admit patient     Julianne Rice, MD 08/20/11 1930

## 2011-08-20 NOTE — Progress Notes (Signed)
Received phone call from [facility] at [facility] office. Pt is to be taken to ER after fabrazyme infusion for serum blood glucose of 948mg /dl. Writer asked if pt should go to [facility]  or [facility] ER. [facility] stated [facility] ER should suffice.

## 2011-08-20 NOTE — ED Notes (Signed)
Pt was at short stay getting infusion for his "fabry's" disease.  He goes Q 2 weeks for it.  His CBG was found to be >900.  Pt has had decreased appetite x 4days w/diarrhea.  He has no previous hx of diabetes.  Pt is axox 3.

## 2011-08-20 NOTE — ED Notes (Signed)
IV team called. On their way.

## 2011-08-20 NOTE — Progress Notes (Signed)
[name] from [facility] returned call  md wants to proceed c infusion  todays labs were done in office, so we are not to Re-draw these today

## 2011-08-21 LAB — MAGNESIUM: Magnesium: 2.6 mg/dL — ABNORMAL HIGH (ref 1.5–2.5)

## 2011-08-21 LAB — CARDIAC PANEL(CRET KIN+CKTOT+MB+TROPI)
CK, MB: 12 ng/mL (ref 0.3–4.0)
CK, MB: 13.9 ng/mL (ref 0.3–4.0)
Relative Index: 1.2 (ref 0.0–2.5)
Relative Index: 1.3 (ref 0.0–2.5)
Total CK: 1162 U/L — ABNORMAL HIGH (ref 7–232)
Total CK: 813 U/L — ABNORMAL HIGH (ref 7–232)
Total CK: 929 U/L — ABNORMAL HIGH (ref 7–232)
Troponin I: 0.38 ng/mL (ref <0.30)
Troponin I: 0.4 ng/mL (ref <0.30)

## 2011-08-21 LAB — BASIC METABOLIC PANEL
BUN: 23 mg/dL (ref 6–23)
BUN: 24 mg/dL — ABNORMAL HIGH (ref 6–23)
BUN: 27 mg/dL — ABNORMAL HIGH (ref 6–23)
BUN: 31 mg/dL — ABNORMAL HIGH (ref 6–23)
CO2: 22 mEq/L (ref 19–32)
CO2: 23 mEq/L (ref 19–32)
CO2: 24 mEq/L (ref 19–32)
CO2: 28 mEq/L (ref 19–32)
Calcium: 8.7 mg/dL (ref 8.4–10.5)
Calcium: 8.9 mg/dL (ref 8.4–10.5)
Calcium: 8.9 mg/dL (ref 8.4–10.5)
Calcium: 9.7 mg/dL (ref 8.4–10.5)
Chloride: 103 mEq/L (ref 96–112)
Chloride: 104 mEq/L (ref 96–112)
Chloride: 104 mEq/L (ref 96–112)
Chloride: 105 mEq/L (ref 96–112)
Creatinine, Ser: 1.57 mg/dL — ABNORMAL HIGH (ref 0.50–1.35)
Creatinine, Ser: 1.63 mg/dL — ABNORMAL HIGH (ref 0.50–1.35)
Creatinine, Ser: 1.73 mg/dL — ABNORMAL HIGH (ref 0.50–1.35)
Creatinine, Ser: 2.11 mg/dL — ABNORMAL HIGH (ref 0.50–1.35)
GFR calc Af Amer: 40 mL/min — ABNORMAL LOW (ref >90)
GFR calc Af Amer: 51 mL/min — ABNORMAL LOW (ref >90)
GFR calc Af Amer: 55 mL/min — ABNORMAL LOW (ref >90)
GFR calc Af Amer: 58 mL/min — ABNORMAL LOW (ref >90)
GFR calc non Af Amer: 35 mL/min — ABNORMAL LOW (ref >90)
GFR calc non Af Amer: 44 mL/min — ABNORMAL LOW (ref >90)
GFR calc non Af Amer: 48 mL/min — ABNORMAL LOW (ref >90)
GFR calc non Af Amer: 50 mL/min — ABNORMAL LOW (ref >90)
Glucose, Bld: 150 mg/dL — ABNORMAL HIGH (ref 70–99)
Glucose, Bld: 165 mg/dL — ABNORMAL HIGH (ref 70–99)
Glucose, Bld: 203 mg/dL — ABNORMAL HIGH (ref 70–99)
Glucose, Bld: 86 mg/dL (ref 70–99)
Potassium: 3.5 mEq/L (ref 3.5–5.1)
Potassium: 3.7 mEq/L (ref 3.5–5.1)
Potassium: 3.7 mEq/L (ref 3.5–5.1)
Potassium: 3.7 mEq/L (ref 3.5–5.1)
Sodium: 137 mEq/L (ref 135–145)
Sodium: 137 mEq/L (ref 135–145)
Sodium: 139 mEq/L (ref 135–145)
Sodium: 143 mEq/L (ref 135–145)

## 2011-08-21 LAB — GLUCOSE, CAPILLARY
Glucose-Capillary: 102 mg/dL — ABNORMAL HIGH (ref 70–99)
Glucose-Capillary: 112 mg/dL — ABNORMAL HIGH (ref 70–99)
Glucose-Capillary: 124 mg/dL — ABNORMAL HIGH (ref 70–99)
Glucose-Capillary: 128 mg/dL — ABNORMAL HIGH (ref 70–99)
Glucose-Capillary: 141 mg/dL — ABNORMAL HIGH (ref 70–99)
Glucose-Capillary: 150 mg/dL — ABNORMAL HIGH (ref 70–99)
Glucose-Capillary: 160 mg/dL — ABNORMAL HIGH (ref 70–99)
Glucose-Capillary: 175 mg/dL — ABNORMAL HIGH (ref 70–99)
Glucose-Capillary: 183 mg/dL — ABNORMAL HIGH (ref 70–99)
Glucose-Capillary: 197 mg/dL — ABNORMAL HIGH (ref 70–99)
Glucose-Capillary: 201 mg/dL — ABNORMAL HIGH (ref 70–99)
Glucose-Capillary: 206 mg/dL — ABNORMAL HIGH (ref 70–99)
Glucose-Capillary: 224 mg/dL — ABNORMAL HIGH (ref 70–99)
Glucose-Capillary: 224 mg/dL — ABNORMAL HIGH (ref 70–99)
Glucose-Capillary: 325 mg/dL — ABNORMAL HIGH (ref 70–99)

## 2011-08-21 LAB — HEMOGLOBIN A1C
Hgb A1c MFr Bld: 12.5 % — ABNORMAL HIGH (ref <5.7)
Mean Plasma Glucose: 312 mg/dL — ABNORMAL HIGH (ref <117)

## 2011-08-21 LAB — UREA NITROGEN, URINE: Urea Nitrogen, Ur: 197 mg/dL

## 2011-08-21 LAB — MRSA PCR SCREENING: MRSA by PCR: NEGATIVE

## 2011-08-21 LAB — OSMOLALITY: Osmolality: 304 mOsm/kg — ABNORMAL HIGH (ref 275–300)

## 2011-08-21 LAB — SODIUM, URINE, RANDOM: Sodium, Ur: 12 mEq/L

## 2011-08-21 LAB — OSMOLALITY, URINE: Osmolality, Ur: 392 mOsm/kg (ref 390–1090)

## 2011-08-21 MED ORDER — INSULIN ASPART 100 UNIT/ML ~~LOC~~ SOLN
6.0000 [IU] | Freq: Three times a day (TID) | SUBCUTANEOUS | Status: DC
  Administered 2011-08-21 – 2011-08-22 (×2): 6 [IU] via SUBCUTANEOUS

## 2011-08-21 MED ORDER — INSULIN GLARGINE 100 UNIT/ML ~~LOC~~ SOLN
25.0000 [IU] | Freq: Two times a day (BID) | SUBCUTANEOUS | Status: DC
  Administered 2011-08-21 (×2): 25 [IU] via SUBCUTANEOUS

## 2011-08-21 MED ORDER — INSULIN ASPART 100 UNIT/ML ~~LOC~~ SOLN
0.0000 [IU] | Freq: Three times a day (TID) | SUBCUTANEOUS | Status: DC
  Administered 2011-08-21: 4 [IU] via SUBCUTANEOUS
  Administered 2011-08-22: 11 [IU] via SUBCUTANEOUS
  Administered 2011-08-22 – 2011-08-23 (×2): 3 [IU] via SUBCUTANEOUS
  Administered 2011-08-23: 4 [IU] via SUBCUTANEOUS

## 2011-08-21 MED ORDER — INSULIN PEN STARTER KIT: 1.0000 | Freq: Once | Status: DC

## 2011-08-21 MED ORDER — INSULIN ASPART 100 UNIT/ML ~~LOC~~ SOLN
0.0000 [IU] | Freq: Every day | SUBCUTANEOUS | Status: DC
  Administered 2011-08-21: 4 [IU] via SUBCUTANEOUS
  Administered 2011-08-22: 3 [IU] via SUBCUTANEOUS

## 2011-08-21 MED ORDER — BD GETTING STARTED TAKE HOME KIT: 1/2ML X 30G SYRINGES
1.0000 | Freq: Once | Status: DC
  Filled 2011-08-21: qty 1

## 2011-08-21 NOTE — Plan of Care (Signed)
Problem: Consults Goal: Diagnosis-Diabetes Mellitus [facility]

## 2011-08-21 NOTE — Progress Notes (Signed)
PROGRESS NOTE  [name] [id number] DOB: 1960-12-15 DOA: 08/20/2011 PCP: Elizabeth Palau, MD, MD  Brief narrative: [name] is a 51 year old man with no PMH of DM who was admitted on 08/20/11 with hyperosmolar non ketotic hyperglycemia.  He has been managed with an insulin drip in the SDU over night.  Assessment/Plan: Principal Problem:  *Type 2 diabetes mellitus with hyperosmolar nonketotic hyperglycemia [name] was admitted to the step down unit and put on an insulin drip per the Glucomander protocol. He was vigorously hydrated and his IV fluids have been changed to dextrose-containing IV fluids given his CBGs that are less than 200. Overnight, his glucoses have come down considerably. This is a new diagnosis. He will need extensive teaching and likely require insulin therapy at discharge. Once [name] meets the parameters for discontinuation of the insulin drip, she will be transitioned over to basal/bolus insulin. We will ask the diabetes coordinator and the dietitian to see him while he's in the hospital, and have the nurses teach him how to check his blood glucoses and self administer insulin. A hemoglobin A1c has been checked but the results are pending at this time. Active Problems:  Mobitz (type) II atrioventricular block / History of cardiac pacemaker The patient is status post pacemaker implantation and the pacemaker appears to be functioning correctly.  Fabry disease Patient is under treatment for this and gets Fibrozyme every 2 weeks.  Hypertension The patient's Cozaar and spironolactone are currently on hold due to acute renal failure. We'll continue to monitor his blood pressure and renal function and resume these medications when indicated.  Acute on chronic renal insufficiency The patient's baseline creatinine is 1.2. His current creatinine is elevated over her usual baseline values, but has come down from admission. We'll continue to hydrate and  monitor. Cozaar and spironolactone are on hold for now.  Troponin level elevated Patient has elevated cardiac markers which are consistent with the man ischemia in the setting of acute illness. He is completely asymptomatic. Apparently the cardiologist was consulted by telephone who recommended ongoing evaluation of his cardiac enzymes informal consultation if he becomes symptomatic or his enzymes continue to rise. We'll check another set of cardiac enzymes in the morning. He has been placed on aspirin therapy.    Code Status: Full Family CommunicationKaveh, [name] Mother [phone] Disposition Plan: Home when stable  Consultants:  None  Procedures:  12 Lead EKG 08/20/11: VENTRICULAR-PACED RHYTHM. NO FURTHER ANALYSIS ATTEMPTED DUE TO PACED RHYTHM RECONSTRUCTED PACER SPIKES IN LD(S) II,aVR ~ bedside recording  Antibiotics:  None   Subjective  [name] feels a bit better this morning. He is hungry. He denies chest pain or dyspnea. He has had recent diarrhea and nausea, but this seems to be resolved this morning.   Objective    Interim History: [name] was maintained on an insulin drip in the step down unit overnight. He was stable.   Objective: Filed Vitals:   08/21/11 0000 08/21/11 0200 08/21/11 0400 08/21/11 0500  BP: 96/32 150/64  109/80  Pulse: 69 69  73  Temp: 98.5 F (36.9 C)  98.6 F (37 C)   TempSrc: Oral  Oral   Resp: 19 14  15   Height:      Weight:      SpO2: 99% 96%  97%    Intake/Output Summary (Last 24 hours) at 08/21/11 0720 Last data filed at 08/21/11 0600  Gross per 24 hour  Intake  907.5 ml  Output  1300 ml  Net -392.5 ml    Exam: Gen:  NAD Cardiovascular:  RRR, No M/R/G Respiratory: Lungs CTAB Gastrointestinal: Abdomen soft, NT/ND with normal active bowel sounds. Extremities: No C/E/C    Data Reviewed: Basic Metabolic Panel:  Lab Q000111Q 0640 08/21/11 0015 08/20/11 1600  NA 139 143 128*  K 3.7 3.5 --  CL 105 104  89*  CO2 23 28 24   GLUCOSE 203* 86 907*  BUN 27* 31* 35*  CREATININE 1.63* 2.11* 2.24*  CALCIUM 8.9 9.7 9.2  MG -- 2.6* --  PHOS -- -- --   Liver Function Tests:  Lab 08/20/11 1600  AST 34  ALT 30  ALKPHOS 101  BILITOT 0.6  PROT 8.6*  ALBUMIN 3.7   CBC:  Lab 08/20/11 1600  WBC 11.7*  NEUTROABS 7.2  HGB 16.6  HCT 46.5  MCV 85.8  PLT 169   Cardiac Enzymes:  Lab 08/21/11 0015 08/20/11 1630  CKTOTAL 1162* 1200*  CKMB 13.9* 13.6*  CKMBINDEX -- --  TROPONINI 0.40* 0.41*   CBG:  Lab 08/21/11 0528 08/21/11 0416 08/21/11 0313 08/21/11 0200 08/21/11 0047  GLUCAP 201* 224* 197* 124* 102*    Recent Results (from the past 240 hour(s))  MRSA PCR SCREENING     Status: Normal   Collection Time   08/20/11 10:51 PM      Component Value Range Status Comment   MRSA by PCR NEGATIVE  NEGATIVE  Final      Studies: Dg Chest 2 View  08/20/2011  *RADIOLOGY REPORT*  Clinical Data: , chest pain, shortness of breath  CHEST - 2 VIEW  Comparison: 03/10/2011  Findings: Left subclavian transvenous pacemaker stable. Lungs clear.  Heart size and pulmonary vascularity normal.  No effusion. Visualized bones unremarkable.  IMPRESSION: No acute disease  Original Report Authenticated By: Dillard Cannon III, M.D.    Scheduled Meds:   . sodium chloride   Intravenous Once  . aspirin  81 mg Oral Daily  . heparin  5,000 Units Subcutaneous Q8H  . influenza  inactive virus vaccine  0.5 mL Intramuscular Tomorrow-1000  . loratadine  10 mg Oral Daily  . pneumococcal 23 valent vaccine  0.5 mL Intramuscular Tomorrow-1000  . potassium chloride  10 mEq Intravenous Q1H  . sodium chloride  500 mL Intravenous Once  . sodium chloride  500 mL Intravenous Once  . sodium chloride  3 mL Intravenous Q12H  . DISCONTD: heparin  5,000 Units Subcutaneous Q8H   Continuous Infusions:   . 0.9 % NaCl with KCl 20 mEq / L 200 mL/hr at 08/20/11 2300  . dextrose 5 % and 0.45% NaCl 125 mL/hr at 08/21/11 0215  .  insulin (NOVOLIN-R) infusion    . insulin (NOVOLIN-R) infusion    . DISCONTD: sodium chloride        LOS: 1 day   Jacquelynn Cree, MD Pager [phone]  08/21/2011, 7:20 AM

## 2011-08-21 NOTE — Progress Notes (Signed)
I came by to see Mr. Trefry for a courtesy visit.  He is doing well, started on insulin therapy for new onset DM. His renal function is at baseline, creat 1.5 and eGFR of around 50 ml/min. 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  Appreciate the care of [facility] physicians.  Will be available as needed.   Kelly Splinter  MD [facility] [phone] pgr    [phone] cell 08/21/2011, 5:41 PM

## 2011-08-22 LAB — GLUCOSE, CAPILLARY
Glucose-Capillary: 264 mg/dL — ABNORMAL HIGH (ref 70–99)
Glucose-Capillary: 141 mg/dL — ABNORMAL HIGH (ref 70–99)
Glucose-Capillary: 64 mg/dL — ABNORMAL LOW (ref 70–99)
Glucose-Capillary: 66 mg/dL — ABNORMAL LOW (ref 70–99)
Glucose-Capillary: 112 mg/dL — ABNORMAL HIGH (ref 70–99)
Glucose-Capillary: 149 mg/dL — ABNORMAL HIGH (ref 70–99)

## 2011-08-22 LAB — CARDIAC PANEL(CRET KIN+CKTOT+MB+TROPI)
CK, MB: 9.9 ng/mL (ref 0.3–4.0)
Relative Index: 1.9 (ref 0.0–2.5)
Total CK: 516 U/L — ABNORMAL HIGH (ref 7–232)
Troponin I: <0.3 ng/mL (ref <0.30)

## 2011-08-22 MED ORDER — INSULIN GLARGINE 100 UNIT/ML ~~LOC~~ SOLN
30.0000 [IU] | Freq: Two times a day (BID) | SUBCUTANEOUS | Status: DC
  Administered 2011-08-22 – 2011-08-23 (×3): 30 [IU] via SUBCUTANEOUS

## 2011-08-22 MED ORDER — INSULIN ASPART 100 UNIT/ML ~~LOC~~ SOLN
8.0000 [IU] | Freq: Three times a day (TID) | SUBCUTANEOUS | Status: DC
  Administered 2011-08-22 – 2011-08-23 (×3): 8 [IU] via SUBCUTANEOUS

## 2011-08-22 NOTE — Progress Notes (Addendum)
PROGRESS NOTE  [name] [name] [id number] DOB: 17-Jan-1961 DOA: 08/20/2011 PCP: [name] Palau, MD, MD  Brief narrative: [name] is a 51 year old man with no PMH of DM who was admitted on 08/20/11 with hyperosmolar non ketotic hyperglycemia.  He has been managed with an insulin drip and subsequently transitioned over to basal/bolus insulin therapy. We are awaiting consultation with diabetes coordinator and dietitian.  Assessment/Plan: Principal Problem:  *Type 2 diabetes mellitus with hyperosmolar nonketotic hyperglycemia [name] was admitted to the step down unit and put on an insulin drip per the Glucomander protocol. He was vigorously hydrated and his IV fluids have been changed to dextrose-containing IV fluids given his CBGs that are less than 200. Overnight, his glucoses have come down considerably. This is a new diagnosis. He will need extensive teaching and likely require insulin therapy at discharge. Once [name] met the parameters for discontinuation of the insulin drip, he was transitioned over to basal/bolus insulin. We have asked the diabetes coordinator and the dietitian to see him while he's in the hospital, and have the nurses teach him how to check his blood glucoses and self administer insulin. A hemoglobin A1c was checked and found to be 12.5%. CBGs have ranged from 160-325 over the past 24 hours. We will increase his Lantus and his meal coverage. We will discontinue IV fluids containing dextrose. Active Problems:  Mobitz (type) II atrioventricular block / History of cardiac pacemaker The patient is status post pacemaker implantation and the pacemaker appears to be functioning correctly.  Fabry disease Patient is under treatment for this and gets Fibrozyme every 2 weeks.  Hypertension The patient's Cozaar and spironolactone are currently on hold due to acute renal failure. We'll continue to monitor his blood pressure and renal function and resume these  medications when indicated.  Acute on chronic renal insufficiency The patient's baseline creatinine is 1.2. His current creatinine is elevated over her usual baseline values, but has come down from admission. Cozaar and spironolactone are on hold for now.  Troponin level elevated Patient has elevated cardiac markers which are consistent with the man ischemia in the setting of acute illness. He is completely asymptomatic. Apparently the cardiologist was consulted by telephone who recommended ongoing evaluation of his cardiac enzymes informal consultation if he becomes symptomatic or his enzymes continue to rise. We checked another set of cardiac enzymes this morning, and his troponins have normalized. His CK and CK-MB are coming down.[name] Kitchen He has been placed on aspirin therapy, and remains asymptomatic. We'll discontinue telemetry monitoring.  Code Status: Full Family CommunicationIzahia, [name] Mother [phone] Disposition Plan: Home when stable  Consultants:  None  Procedures:  12 Lead EKG 08/20/11: VENTRICULAR-PACED RHYTHM. NO FURTHER ANALYSIS ATTEMPTED DUE TO PACED RHYTHM RECONSTRUCTED PACER SPIKES IN LD(S) II,aVR ~ bedside recording  Antibiotics:  None   Subjective  Mr. Skufca feels well. He denies chest pain, dyspnea, or other symptoms. No nausea or vomiting.   Objective    Interim History: Mr. Schieffer was stable overnight.   Objective: Filed Vitals:   08/21/11 2211 08/22/11 0217 08/22/11 0646 08/22/11 1003  BP: 119/81 145/73 113/84 141/81  Pulse: 67 69 69 70  Temp: 98.6 F (37 C) 98.3 F (36.8 C) 98.7 F (37.1 C) 97.7 F (36.5 C)  TempSrc: Oral Oral Oral Oral  Resp: 18 18 18 18   Height:      Weight:      SpO2: 97% 97% 98% 97%    Intake/Output Summary (Last 24 hours) at  08/22/11 1018 Last data filed at 08/21/11 1538  Gross per 24 hour  Intake 1117.4 ml  Output    200 ml  Net  917.4 ml    Exam: Gen:  NAD Cardiovascular:  RRR, No  M/R/G Respiratory: Lungs CTAB Gastrointestinal: Abdomen soft, NT/ND with normal active bowel sounds. Extremities: No C/E/C    Data Reviewed: Basic Metabolic Panel:  Lab Q000111Q 1635 08/21/11 1030 08/21/11 0640 08/21/11 0015 08/20/11 1600  NA 137 137 139 143 128*  K 3.7 3.7 -- -- --  CL 104 103 105 104 89*  CO2 22 24 23 28 24   GLUCOSE 165* 150* 203* 86 907*  BUN 23 24* 27* 31* 35*  CREATININE 1.73* 1.57* 1.63* 2.11* 2.24*  CALCIUM 8.7 8.9 8.9 9.7 9.2  MG -- -- -- 2.6* --  PHOS -- -- -- -- --   Liver Function Tests:  Lab 08/20/11 1600  AST 34  ALT 30  ALKPHOS 101  BILITOT 0.6  PROT 8.6*  ALBUMIN 3.7   CBC:  Lab 08/20/11 1600  WBC 11.7*  NEUTROABS 7.2  HGB 16.6  HCT 46.5  MCV 85.8  PLT 169   Cardiac Enzymes:  Lab 08/22/11 0640 08/21/11 1428 08/21/11 0640 08/21/11 0015 08/20/11 1630  CKTOTAL 516* 813* 929* 1162* 1200*  CKMB 9.9* PENDING 12.0* 13.9* 13.6*  CKMBINDEX -- -- -- -- --  TROPONINI <0.30 PENDING 0.38* 0.40* 0.41*   CBG:  Lab 08/22/11 0709 08/21/11 2117 08/21/11 1718 08/21/11 1423 08/21/11 1256  GLUCAP 264* 325* 175* 206* 160*    Ref. Range 08/21/2011 06:40  Hemoglobin A1C Latest Range: <5.7 % 12.5 (H)    Recent Results (from the past 240 hour(s))  MRSA PCR SCREENING     Status: Normal   Collection Time   08/20/11 10:51 PM      Component Value Range Status Comment   MRSA by PCR NEGATIVE  NEGATIVE  Final      Studies: Dg Chest 2 View  08/20/2011  *RADIOLOGY REPORT*  Clinical Data: , chest pain, shortness of breath  CHEST - 2 VIEW  Comparison: 03/10/2011  Findings: Left subclavian transvenous pacemaker stable. Lungs clear.  Heart size and pulmonary vascularity normal.  No effusion. Visualized bones unremarkable.  IMPRESSION: No acute disease  Original Report Authenticated By: Dillard Cannon III, M.D.    Scheduled Meds:    . aspirin  81 mg Oral Daily  . bd getting started take home kit  1 kit Other Once  . heparin  5,000 Units  Subcutaneous Q8H  . influenza  inactive virus vaccine  0.5 mL Intramuscular Tomorrow-1000  . insulin aspart  0-20 Units Subcutaneous TID WC  . insulin aspart  0-5 Units Subcutaneous QHS  . insulin aspart  6 Units Subcutaneous TID WC  . insulin glargine  30 Units Subcutaneous BID  . loratadine  10 mg Oral Daily  . pneumococcal 23 valent vaccine  0.5 mL Intramuscular Tomorrow-1000  . sodium chloride  3 mL Intravenous Q12H  . DISCONTD: Flexpen Starter Kit  1 kit Other Once  . DISCONTD: insulin glargine  25 Units Subcutaneous BID   Continuous Infusions:    . dextrose 5 % and 0.45% NaCl 125 mL/hr at 08/22/11 0954  . insulin (NOVOLIN-R) infusion    . insulin (NOVOLIN-R) infusion Stopped (08/21/11 1612)  . DISCONTD: 0.9 % NaCl with KCl 20 mEq / L 200 mL/hr at 08/20/11 2300      LOS: 2 days   Jacquelynn Cree, MD Pager (  [phone]  08/22/2011, 10:18 AM

## 2011-08-22 NOTE — Plan of Care (Signed)
Problem: Food- and Nutrition-Related Knowledge Deficit (NB-1.1) Goal: Nutrition education Formal process to instruct or train a patient/client in a skill or to impart knowledge to help patients/clients voluntarily manage or modify food choices and eating behavior to maintain or improve health.  Outcome: Completed/Met Date Met:  08/22/11 Patient newly diagnosed with Type 2 diabetes. HgbA1c 12.5. Patient has some baseline knowledge of diabetes nutrition due to mother and brother with DM. Discussed with patient foods with carbohydrates, carbohydrate counting, portion control, and label reading. Patient was given an Customer service manager.

## 2011-08-23 DIAGNOSIS — E109 Type 1 diabetes mellitus without complications: Secondary | ICD-10-CM | POA: Diagnosis not present

## 2011-08-23 LAB — CBC
HCT: 42.7 % (ref 39.0–52.0)
Hemoglobin: 14.5 g/dL (ref 13.0–17.0)
MCH: 29.5 pg (ref 26.0–34.0)
MCHC: 34 g/dL (ref 30.0–36.0)
MCV: 86.8 fL (ref 78.0–100.0)
Platelets: 138 10*3/uL — ABNORMAL LOW (ref 150–400)
RBC: 4.92 MIL/uL (ref 4.22–5.81)
RDW: 12.6 % (ref 11.5–15.5)
WBC: 8 10*3/uL (ref 4.0–10.5)

## 2011-08-23 LAB — BASIC METABOLIC PANEL
BUN: 12 mg/dL (ref 6–23)
CO2: 25 mEq/L (ref 19–32)
Calcium: 9 mg/dL (ref 8.4–10.5)
Chloride: 105 mEq/L (ref 96–112)
Creatinine, Ser: 1.34 mg/dL (ref 0.50–1.35)
GFR calc Af Amer: 70 mL/min — ABNORMAL LOW (ref >90)
GFR calc non Af Amer: 60 mL/min — ABNORMAL LOW (ref >90)
Glucose, Bld: 76 mg/dL (ref 70–99)
Potassium: 3.5 mEq/L (ref 3.5–5.1)
Sodium: 139 mEq/L (ref 135–145)

## 2011-08-23 LAB — GLUCOSE, CAPILLARY
Glucose-Capillary: 103 mg/dL — ABNORMAL HIGH (ref 70–99)
Glucose-Capillary: 157 mg/dL — ABNORMAL HIGH (ref 70–99)
Glucose-Capillary: 141 mg/dL — ABNORMAL HIGH (ref 70–99)

## 2011-08-23 MED ORDER — INSULIN SYRINGES (DISPOSABLE) U-100 1 ML MISC: 1.0000 | Freq: Four times a day (QID) | Status: DC

## 2011-08-23 MED ORDER — LIVING WELL WITH DIABETES BOOK
Freq: Once | Status: DC
  Filled 2011-08-23: qty 1

## 2011-08-23 MED ORDER — BLOOD GLUCOSE METER KIT: PACK | Status: DC

## 2011-08-23 MED ORDER — INSULIN GLARGINE 100 UNIT/ML ~~LOC~~ SOLN: 60.0000 [IU] | Freq: Every day | SUBCUTANEOUS | Status: DC

## 2011-08-23 MED ORDER — INSULIN ASPART 100 UNIT/ML ~~LOC~~ SOLN: 8.0000 [IU] | Freq: Three times a day (TID) | SUBCUTANEOUS | Status: DC

## 2011-08-23 NOTE — Progress Notes (Signed)
UR complete 

## 2011-08-23 NOTE — Discharge Summary (Signed)
Physician Discharge Summary  [name] MRN: [id number] DOB/AGE: [birth year] 51 y.o.  Admit date: 08/20/2011 Discharge date: 08/23/2011  Primary Care Physician:  Elizabeth Palau, MD, MD   Discharge Diagnoses:    Present on Admission:  .Type 2 diabetes mellitus with hyperosmolar nonketotic hyperglycemia .Acute on chronic renal insufficiency .Troponin level elevated .Mobitz (type) II atrioventricular block .Atrial fibrillation .Fabry disease .Hypertension  Discharge Medications:  Medication List  As of 08/23/2011  3:19 PM   TAKE these medications         aspirin 81 MG tablet   Take 81 mg by mouth daily.      aspirin 325 MG tablet   Take 325 mg by mouth once.      Blood Glucose Meter kit   Use as instructed      FABRAZYME IV   Inject into the vein every 14 (fourteen) days.      fexofenadine 60 MG tablet   Commonly known as: ALLEGRA   Take 60 mg by mouth once.      insulin aspart 100 UNIT/ML injection   Commonly known as: novoLOG   Inject 8 Units into the skin 3 (three) times daily with meals.      insulin glargine 100 UNIT/ML injection   Commonly known as: LANTUS   Inject 60 Units into the skin at bedtime.      Insulin Syringes (Disposable) U-100 1 ML Misc   1 Syringe by Does not apply route QID.      losartan 25 MG tablet   Commonly known as: COZAAR   Take 25 mg by mouth 2 (two) times daily.      spironolactone 25 MG tablet   Commonly known as: ALDACTONE   Take 25 mg by mouth 2 (two) times daily.             Disposition and Follow-up: The patient is being discharged home. He is instructed to followup with his primary care physician in one week. He is instructed to keep a log of his blood glucoses and bring these with him to his appointment.   Medical Consults:  None   Other Consults:  Diabetes coordinator  Significant Diagnostic Studies:   Dg Chest 2 View 08/20/2011  *RADIOLOGY REPORT*  Clinical Data: , chest pain,  shortness of breath  CHEST - 2 VIEW  Comparison: 03/10/2011  Findings: Left subclavian transvenous pacemaker stable. Lungs clear.  Heart size and pulmonary vascularity normal.  No effusion. Visualized bones unremarkable.  IMPRESSION: No acute disease  Original Report Authenticated By: Trecia Rogers, M.D.    Discharge Laboratory Values: Basic Metabolic Panel:  Lab [id number] 0530 08/21/11 1635 08/21/11 1030 08/21/11 0640 08/21/11 0015  NA 139 137 137 139 143  K 3.5 3.7 -- -- --  CL 105 104 103 105 104  CO2 25 22 24 23 28   GLUCOSE 76 165* 150* 203* 86  BUN 12 23 24* 27* 31*  CREATININE 1.34 1.73* 1.57* 1.63* 2.11*  CALCIUM 9.0 8.7 8.9 8.9 9.7  MG -- -- -- -- 2.6*  PHOS -- -- -- -- --   GFR Estimated Creatinine Clearance: 84.3 ml/min (by C-G formula based on Cr of 1.34). Liver Function Tests:  Lab 08/20/11 1600  AST 34  ALT 30  ALKPHOS 101  BILITOT 0.6  PROT 8.6*  ALBUMIN 3.7    CBC:  Lab 08/23/11 0530 08/20/11 1600  WBC 8.0 11.7*  NEUTROABS -- 7.2  HGB 14.5 16.6  HCT 42.7 46.5  MCV 86.8 85.8  PLT 138* 169   Cardiac Enzymes:  Lab 08/22/11 0640 08/21/11 1428 08/21/11 0640 08/21/11 0015 08/20/11 1630  CKTOTAL 516* 813* 929* 1162* 1200*  CKMB 9.9* PENDING 12.0* 13.9* 13.6*  CKMBINDEX -- -- -- -- --  TROPONINI <0.30 PENDING 0.38* 0.40* 0.41*   CBG:  Lab 08/23/11 1153 08/23/11 0728 08/22/11 2130 08/22/11 1823 08/22/11 1721  GLUCAP 157* 103* 149* 112* 64*   Hgb A1c  Basename 08/21/11 0640  HGBA1C 12.5*   Microbiology Recent Results (from the past 240 hour(s))  MRSA PCR SCREENING     Status: Normal   Collection Time   08/20/11 10:51 PM      Component Value Range Status Comment   MRSA by PCR NEGATIVE  NEGATIVE  Final      Brief H and P: For complete details please refer to admission H and P, but in brief, [name] is a 51 year old man with no PMH of DM who was admitted on 08/20/11 with hyperosmolar non ketotic hyperglycemia.    Physical Exam at  Discharge: BP 155/83  Pulse 73  Temp(Src) 99.3 F (37.4 C) (Oral)  Resp 18  Ht 5\' 9"  (1.753 m)  Wt 119.9 kg (264 lb 5.3 oz)  BMI 39.03 kg/m2  SpO2 96% Gen:  NAD Cardiovascular:  RRR, No M/R/G Respiratory: Lungs CTAB Gastrointestinal: Abdomen soft, NT/ND with normal active bowel sounds. Extremities: 1+ C/E/C      Hospital Course:  Principal Problem:  *Type 2 diabetes mellitus with hyperosmolar nonketotic hyperglycemia  [name] was admitted to the step down unit and put on an insulin drip per the Glucomander protocol. He was vigorously hydrated and his IV fluids were changed to dextrose-containing IV fluids when his CBGs were less than 200. Over the first 24 hours of his hospital stay, his glucoses came down considerably. This is a new diagnosis.  Once [name] met the parameters for discontinuation of the insulin drip, he was transitioned over to basal/bolus insulin. He was seen and evaluated by the diabetes coordinator and the dietitian.   We also had the nurses teach him how to check his blood glucoses and self administer insulin. A hemoglobin A1c was checked and found to be 12.5%. The patient will be set up for outpatient diabetes education and will need close followup by his PCP to ensure that his sugars are well-controlled. This point, he is stable for discharge and will be discharged on a combination of 60 units of Lantus daily and 8 units of NovoLog before each meal.  Active Problems:  Mobitz (type) II atrioventricular block / History of cardiac pacemaker  The patient is status post pacemaker implantation and the pacemaker appeared to be functioning correctly.  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 He should followup with his nephrologist as scheduled. Hypertension  The patient's Cozaar and spironolactone were placed on hold due to acute renal failure. At this point, his creatinine is back to baseline and he can safely resume  these medications. Acute on chronic renal insufficiency  The patient's acute renal failure was prerenal in etiology and he was vigorously hydrated. The patient's baseline creatinine is 1.2. His current creatinine is at his usual baseline values. Troponin level elevated  Patient had elevated cardiac markers which were consistent with demand ischemia in the setting of acute illness. He was completely asymptomatic. Apparently the cardiologist was consulted by telephone who recommended ongoing evaluation of his cardiac enzymes  and formal consultation if he becomes symptomatic or his enzymes continue to rise. We checked another set of cardiac enzymes on 08/22/2011, and his troponins had normalized. His CK and CK-MB were coming down.[name] Kitchen He has been placed on aspirin therapy, and remains asymptomatic. No further workup felt to be necessary at this time.   Recommendations for hospital follow-up: 1.  Recommend an outpatient evaluation of his lipid status given his new diagnosis of diabetes. 2.  Recommend close followup for evaluation of glycemic control on insulin therapy.  Diet:  Carbohydrate modified  Activity:  No restrictions  Condition at Discharge:   Improved  Time spent on Discharge:  35 minutes  Signed: Dr. Margreta Journey Kairav Russomanno Pager [phone] 08/23/2011, 3:19 PM

## 2011-08-23 NOTE — Discharge Instructions (Signed)
Diabetes, Type 2 Diabetes is a long-lasting (chronic) disease. In type 2 diabetes, the pancreas does not make enough insulin (a hormone), and the body does not respond normally to the insulin that is made. This type of diabetes was also previously called adult-onset diabetes. It usually occurs after the age of 41, but it can occur at any age.  CAUSES  Type 2 diabetes happens because the pancreasis not making enough insulin or your body has trouble using the insulin that your pancreas does make properly. SYMPTOMS   Drinking more than usual.   Urinating more than usual.   Blurred vision.   Dry, itchy skin.   Frequent infections.   Feeling more tired than usual (fatigue).  DIAGNOSIS The diagnosis of type 2 diabetes is usually made by one of the following tests:  Fasting blood glucose test. You will not eat for at least 8 hours and then take a blood test.   Random blood glucose test. Your blood glucose (sugar) is checked at any time of the day regardless of when you ate.   Oral glucose tolerance test (OGTT). Your blood glucose is measured after you have not eaten (fasted) and then after you drink a glucose containing beverage.  TREATMENT   Healthy eating.   Exercise.   Medicine, if needed.   Monitoring blood glucose.   Seeing your caregiver regularly.  HOME CARE INSTRUCTIONS   Check your blood glucose at least once a day. More frequent monitoring may be necessary, depending on your medicines and on how well your diabetes is controlled. Your caregiver will advise you.   Take your medicine as directed by your caregiver.   Do not smoke.   Make wise food choices. Ask your caregiver for information. Weight loss can improve your diabetes.   Learn about low blood glucose (hypoglycemia) and how to treat it.   Get your eyes checked regularly.   Have a yearly physical exam. Have your blood pressure checked and your blood and urine tested.   Wear a pendant or bracelet saying  that you have diabetes.   Check your feet every night for cuts, sores, blisters, and redness. Let your caregiver know if you have any problems.  SEEK MEDICAL CARE IF:   You have problems keeping your blood glucose in target range.   You have problems with your medicines.   You have symptoms of an illness that do not improve after 24 hours.   You have a sore or wound that is not healing.   You notice a change in vision or a new problem with your vision.   You have a fever.  MAKE SURE YOU:  Understand these instructions.   Will watch your condition.   Will get help right away if you are not doing well or get worse.  Document Released: 05/24/2005 Document Revised: 05/13/2011 Document Reviewed: 11/09/2010 [facility] Patient Information 2012 [place].Hyperosmolar Hyperglycemic Nonketotic Syndrome Hyperosmolar Hyperglycemic Nonketotic Syndrome, or HHNS, is a serious condition most frequently seen in older people. HHNS can happen to people occurring more often in people with type 2, non-insulin dependent diabetes. HHNS is usually brought on by something such as an illness or infection. SYMPTOMS  In HHNS, blood sugar levels rise, and your body tries to get rid of the excess sugar by passing it into your urine. You pass lots of urine at first. Later, as you become dehydrated (not enough water in your body), you may go to the bathroom less and your urine becomes darker. You  may be very thirsty. Even if you are not thirsty, you need to drink liquids. If you do not drink enough liquids at this point, you can get dehydrated. If HHNS continues, the severe dehydration will lead to seizures, coma and eventually death. HHNS may take days or even weeks to develop. WARNING SIGNS OF HHNS  Blood sugar level over 600 mg/dl.   Dry, parched mouth   Extreme thirst (although this may gradually disappear).   Warm, dry skin that does not sweat   High fever over 101 F (38.3 C), for example.    Sleepiness or confusion   Loss of vision   Hallucinations (seeing or hearing things that are not there).   Weakness on one side of the body.   If you have any of these symptoms, call one of your caregivers.  HOW TO AVOID HHNS The best way to avoid HHNS is to check your blood sugar regularly. Many people check their blood sugar several times a day, usually before or after meals. Talk with your caregivers about when to check and what the numbers mean. You should also talk with your caregivers about your target blood sugar range and when to call if your blood sugars are too high, too low or not in your target range. When you are sick, you will check your blood sugar more often, and drink a glass of liquid (alcohol-free and caffeine-free) every hour. Work with your team to develop your own sick day plan.  RELATED INFORMATION Another condition to watch signs for is ketoacidosis, which means dangerously high levels of ketones, or acids, that build up in the blood. Ketones appear in the urine when your body does not have enough insulin, and can poison the body. If not treated, you may become very sick or even comatose. This can lead to death. Document Released: Apr 14, 2004 Document Revised: 05/13/2011 Document Reviewed: 09/01/2005 [facility] Patient Information 2012 [place].Insulin Treatment in Diabetes Diabetes is a lasting (chronic) disease. It occurs when the body does not properly use the sugar (glucose) that is released from food. Glucose levels are controlled by a hormone called insulin, which is made by your pancreas. Depending on the type of diabetes you have, either:  The pancreas does not make any insulin (type 1 diabetes).   The pancreas makes too little insulin, and the body cannot respond normally to the insulin that is made (type 2 diabetes).  Without insulin, death can occur. Diabetes requires lifelong monitoring and treatment. This document will discuss the role of insulin in  your treatment and provide information about insulin.  LIFESTYLE CHOICES Lifestyle choices can affect both the control of your diabetes and your risk of complications from diabetes. Lifestyle choices are critically important in the overall management of diabetes. They are important even if you do not need to take insulin.  Eat a healthy diet. Ask for help if you need it.   Exercise regularly. Ask for help if you need it.   Diet and exercise can help:   Reduce the amount of insulin you need.   Your body to use your insulin more effectively.   Reduce your risk of high blood pressure (or reduce your blood pressure, if it is high).   Reduce your cholesterol level.   Reduce your weight.  Healthy lifestyle choices play an important role in controlling cardiovascular disease (heart attack, stroke, vascular disease, and others), which is a primary complication of diabetes. For optimal control of diabetes, you must reduce your cardiovascular risks  and manage your blood sugar. MEDICATIONS BESIDES INSULIN  Your caregiver may recommend medicines for you in addition to insulin. This will depend on 3 things:   Your diabetes type.   How well insulin alone meets your treatment goals.   Other health factors.  There are different types of medicines that help treat diabetes. The goal is to control your blood sugar the best way possible, which will reduce your risk of complications. Adding other medicines may also reduce the amount of insulin you need.  INSULIN  People with type 1 diabetes must take insulin to stay alive. Their body does not produce it. People with type 2 diabetes might require insulin in addition to, or instead of, other medicines. In either case, proper use of insulin is critical to control your diabetes.  There are a number of different types of insulin. Usually, you will give yourself injections, though others can be trained to give them to you. Some people have an insulin pump that  delivers insulin continuously through a soft, flexible tube (canula) that is placedunder the skin of the abdomen.Other sites including the hips, thighs, or upper arms may also be used.  Using insulin requires that you check your blood sugar several times a day. The exact number of times and time of day to check your blood sugar will vary depending on your type of diabetes, your type of insulin, and treatment goals. Your caregiver will direct you.  Generally, different insulins have different properties. The following is a general guide. Specifics will vary by product, and new products are introduced periodically.   Short-acting insulin starts working quickly (in as little as 5 minutes) and wears off in 3 to 6 hours (sometimes longer). This type of insulin works well when taken before a meal to bring your blood sugar quickly back to normal. There are several different types of short-acting insulin. Some work quickly and others last longer in your system.   Intermediate-acting insulin starts working in 2 hours and wears off after about 10 to18 hours. This insulin will lower your blood sugar for a longer period of time, but will not be as effective in lowering your blood sugar right after a meal.   Long-acting insulin mimics the small amount of insulin that your pancreas usually produces throughout the day. You need to have some insulin present at all times, as it is crucial to the metabolism of brain cells and other cells. Long-acting insulin is meant to be used either once or twice a day. It is usually used in combination with other types of insulin, or in combination with other diabetes medicines.  Discuss the type of insulin you are taking with your caregiver or pharmacist. You will then be aware of when the insulin can be expected to peak and when it will wear off.  Your caregiver will usually have a strategy in mind when treating you with insulin. This will vary with your type of diabetes, your  diabetes treatment goals, and your health history. It is important that you understand something about this strategy so you may be a partner in treating your diabetes. Here are some terms you might hear:  Basal insulin. You need to have a small amount of insulin present in your blood at all times. Sometimes oral medicines will be enough. For other people, and especially for people with type 1 diabetes, insulin is needed. Usually, intermediate-acting or long-acting insulin is used once or twice a day to accomplish this.   Prandial (  meal-related) insulin. Your blood sugar will rise rapidly after a meal. Short-acting insulin can be used right before the meal to bring your blood sugar back to normal quickly. You might be instructed to adjust the amount of insulin depending on how much carbohydrate (starch) is in your meal.   Corrective insulin. You might be instructed to check your blood sugar at certain times of the day. You then might use a small amount of short-acting insulin to bring the blood sugar down to normal if it is elevated.   Tight control (also called intensive therapy). Tight control is keeping your blood sugar as close to your target as possible and keeping it from going too high after meals. People with tight control of their diabetes may have fewer long-term complications from their diabetes.   Glycohemoglobin (also called glyco, glycosylated hemoglobin, Hemoglobin A1c, or A1c) level. This measures how well your blood sugar has been controlled during the past 1 to 3 months. It helps your caregiver see how effective your treatment is and decide if any changes are needed. Your caregiver will discuss your target glycohemoglobin with you.  Insulin treatment requires your careful attention. Treatment plans will be different for different people. Some people do well with a simple program. Others require more complicated programs with multiple insulin injections daily. You will work with your  caregiver to develop the best program for you. Regardless of your insulin treatment plan, you must also do your best on weight control, diet and food choices, exercise, blood pressure control, and cholesterol control.  BENEFITS OF DIABETES TREATMENT  Research shows that optimal treatment of your diabetes will reduce the risk of kidney, eye, and nerve complications. If you have type 1 diabetes, your risk of heart and vascular disease also decreases with good diabetes control. The better you control your blood sugars (and the lower your glycohemoglobin), the lower your risk of complications. RISKS OF INSULIN Although insulin treatment is important, it does have some risks. Insulin treatment may be complex, but it is critical for maintaining your good health. Frequent follow-up visits with your caregiver, at least early on, are usually needed.  Insulin can cause your blood sugar to go too low (hypoglycemia). This can be a dangerous complication that must be quickly recognized and treated.   Weight gain can occur.   Improper injection technique can cause hypoglycemia, skin injury or irritation, or other problems. You must learn to inject insulin properly.   Other medicines used for diabetes can have other complications. Discuss your medicines and their complications with your caregiver.  GOALS OF DIABETES CARE  The goal of treating your diabetes is to allow you to live as long as you can with as few complications as possible. Several factors help you work towards this goal:  You should achieve a blood sugar as close to normal as possible without causing hypoglycemia. Generally, this means a glycohemoglobin of between 6% and 7%.   You should achieve and maintain an ideal body weight.   You should exercise regularly.   Your blood pressure should be under 130/80.   Your cholesterol should be controlled, with your LDL under 100. Your target may be different depending on your health factors.   You  should not smoke.   You should be up to date on immunizations, including influenza and pneumonia.   You should be monitored for eye, kidney, heart, and vascular health regularly. Preventative medicines are sometimes used for these conditions.  Your specific goals may vary, depending  on your health factors. You should discuss issues with your caregiver.  HOME CARE INSTRUCTIONS   Do not smoke.   Eat a healthy diet as instructed. Ask for help if you need it.   Exercise regularly. Ask for help if you need it.   Stay up to date on your immunizations.   See your caregiver on a regular basis.   Follow your diabetes care plan carefully. Take medicines and insulin as directed. Check your blood sugar as directed, and keep track of it in a log. Understand your glycohemoglobin and other diabetes goals. Understand how to detect and treat hypoglycemia.   Follow your care plan for blood pressure and elevated cholesterol if you have these problems.   Do your blood tests as directed. This is important and helps monitor your diabetes.   Check your feet every night for sores or wounds.   See your eye doctor once a year.   If you have an illness that causes loss of appetite, vomiting, or diarrhea, you should speak with your caregiver about temporary changes in your insulin doses and other medicines.  SEEK MEDICAL CARE IF:  You are having problems keeping your blood sugar at target range.   You are having episodes of hypoglycemia.   You are having side effects from your medicines.   You have symptoms of an illness that are not improving after 3 to 4 days.   You have a sore or wound that is not healing.   You notice a change in vision or a new problem with your vision.   You develop a fever of more than 100.5 F (38.1 C).  SEEK IMMEDIATE MEDICAL CARE IF:   Your blood sugar goes below 70, especially if you have confusion, lightheadedness, or other symptoms.   Your blood sugar is very high  (as advised by your caregiver) twice in a row.   An unexplained oral temperature above 102 F (38.9 C) develops.   You pass out.   You have chest pain or trouble breathing.   You have a sudden, severe headache.   You have sudden weakness in one arm or leg.   You have sudden difficulty speaking or swallowing.   You develop vomiting or diarrhea that is getting worse or not improving after 1 day.  Document Released: 08/20/2008 Document Revised: 05/13/2011 Document Reviewed: 09/17/2010 [facility] Patient Information 2012 [place].Insulin Treatment in Diabetes Diabetes is a lasting (chronic) disease. It occurs when the body does not properly use the sugar (glucose) that is released from food. Glucose levels are controlled by a hormone called insulin, which is made by your pancreas. Depending on the type of diabetes you have, either:  The pancreas does not make any insulin (type 1 diabetes).   The pancreas makes too little insulin, and the body cannot respond normally to the insulin that is made (type 2 diabetes).  Without insulin, death can occur. Diabetes requires lifelong monitoring and treatment. This document will discuss the role of insulin in your treatment and provide information about insulin.  LIFESTYLE CHOICES Lifestyle choices can affect both the control of your diabetes and your risk of complications from diabetes. Lifestyle choices are critically important in the overall management of diabetes. They are important even if you do not need to take insulin.  Eat a healthy diet. Ask for help if you need it.   Exercise regularly. Ask for help if you need it.   Diet and exercise can help:   Reduce the amount  of insulin you need.   Your body to use your insulin more effectively.   Reduce your risk of high blood pressure (or reduce your blood pressure, if it is high).   Reduce your cholesterol level.   Reduce your weight.  Healthy lifestyle choices play an important role  in controlling cardiovascular disease (heart attack, stroke, vascular disease, and others), which is a primary complication of diabetes. For optimal control of diabetes, you must reduce your cardiovascular risks and manage your blood sugar. MEDICATIONS BESIDES INSULIN  Your caregiver may recommend medicines for you in addition to insulin. This will depend on 3 things:   Your diabetes type.   How well insulin alone meets your treatment goals.   Other health factors.  There are different types of medicines that help treat diabetes. The goal is to control your blood sugar the best way possible, which will reduce your risk of complications. Adding other medicines may also reduce the amount of insulin you need.  INSULIN  People with type 1 diabetes must take insulin to stay alive. Their body does not produce it. People with type 2 diabetes might require insulin in addition to, or instead of, other medicines. In either case, proper use of insulin is critical to control your diabetes.  There are a number of different types of insulin. Usually, you will give yourself injections, though others can be trained to give them to you. Some people have an insulin pump that delivers insulin continuously through a soft, flexible tube (canula) that is placedunder the skin of the abdomen.Other sites including the hips, thighs, or upper arms may also be used.  Using insulin requires that you check your blood sugar several times a day. The exact number of times and time of day to check your blood sugar will vary depending on your type of diabetes, your type of insulin, and treatment goals. Your caregiver will direct you.  Generally, different insulins have different properties. The following is a general guide. Specifics will vary by product, and new products are introduced periodically.   Short-acting insulin starts working quickly (in as little as 5 minutes) and wears off in 3 to 6 hours (sometimes longer). This type  of insulin works well when taken before a meal to bring your blood sugar quickly back to normal. There are several different types of short-acting insulin. Some work quickly and others last longer in your system.   Intermediate-acting insulin starts working in 2 hours and wears off after about 10 to18 hours. This insulin will lower your blood sugar for a longer period of time, but will not be as effective in lowering your blood sugar right after a meal.   Long-acting insulin mimics the small amount of insulin that your pancreas usually produces throughout the day. You need to have some insulin present at all times, as it is crucial to the metabolism of brain cells and other cells. Long-acting insulin is meant to be used either once or twice a day. It is usually used in combination with other types of insulin, or in combination with other diabetes medicines.  Discuss the type of insulin you are taking with your caregiver or pharmacist. You will then be aware of when the insulin can be expected to peak and when it will wear off.  Your caregiver will usually have a strategy in mind when treating you with insulin. This will vary with your type of diabetes, your diabetes treatment goals, and your health history. It is important that  you understand something about this strategy so you may be a partner in treating your diabetes. Here are some terms you might hear:  Basal insulin. You need to have a small amount of insulin present in your blood at all times. Sometimes oral medicines will be enough. For other people, and especially for people with type 1 diabetes, insulin is needed. Usually, intermediate-acting or long-acting insulin is used once or twice a day to accomplish this.   Prandial (meal-related) insulin. Your blood sugar will rise rapidly after a meal. Short-acting insulin can be used right before the meal to bring your blood sugar back to normal quickly. You might be instructed to adjust the amount of  insulin depending on how much carbohydrate (starch) is in your meal.   Corrective insulin. You might be instructed to check your blood sugar at certain times of the day. You then might use a small amount of short-acting insulin to bring the blood sugar down to normal if it is elevated.   Tight control (also called intensive therapy). Tight control is keeping your blood sugar as close to your target as possible and keeping it from going too high after meals. People with tight control of their diabetes may have fewer long-term complications from their diabetes.   Glycohemoglobin (also called glyco, glycosylated hemoglobin, Hemoglobin A1c, or A1c) level. This measures how well your blood sugar has been controlled during the past 1 to 3 months. It helps your caregiver see how effective your treatment is and decide if any changes are needed. Your caregiver will discuss your target glycohemoglobin with you.  Insulin treatment requires your careful attention. Treatment plans will be different for different people. Some people do well with a simple program. Others require more complicated programs with multiple insulin injections daily. You will work with your caregiver to develop the best program for you. Regardless of your insulin treatment plan, you must also do your best on weight control, diet and food choices, exercise, blood pressure control, and cholesterol control.  BENEFITS OF DIABETES TREATMENT  Research shows that optimal treatment of your diabetes will reduce the risk of kidney, eye, and nerve complications. If you have type 1 diabetes, your risk of heart and vascular disease also decreases with good diabetes control. The better you control your blood sugars (and the lower your glycohemoglobin), the lower your risk of complications. RISKS OF INSULIN Although insulin treatment is important, it does have some risks. Insulin treatment may be complex, but it is critical for maintaining your good health.  Frequent follow-up visits with your caregiver, at least early on, are usually needed.  Insulin can cause your blood sugar to go too low (hypoglycemia). This can be a dangerous complication that must be quickly recognized and treated.   Weight gain can occur.   Improper injection technique can cause hypoglycemia, skin injury or irritation, or other problems. You must learn to inject insulin properly.   Other medicines used for diabetes can have other complications. Discuss your medicines and their complications with your caregiver.  GOALS OF DIABETES CARE  The goal of treating your diabetes is to allow you to live as long as you can with as few complications as possible. Several factors help you work towards this goal:  You should achieve a blood sugar as close to normal as possible without causing hypoglycemia. Generally, this means a glycohemoglobin of between 6% and 7%.   You should achieve and maintain an ideal body weight.   You should exercise regularly.  Your blood pressure should be under 130/80.   Your cholesterol should be controlled, with your LDL under 100. Your target may be different depending on your health factors.   You should not smoke.   You should be up to date on immunizations, including influenza and pneumonia.   You should be monitored for eye, kidney, heart, and vascular health regularly. Preventative medicines are sometimes used for these conditions.  Your specific goals may vary, depending on your health factors. You should discuss issues with your caregiver.  HOME CARE INSTRUCTIONS   Do not smoke.   Eat a healthy diet as instructed. Ask for help if you need it.   Exercise regularly. Ask for help if you need it.   Stay up to date on your immunizations.   See your caregiver on a regular basis.   Follow your diabetes care plan carefully. Take medicines and insulin as directed. Check your blood sugar as directed, and keep track of it in a log. Understand  your glycohemoglobin and other diabetes goals. Understand how to detect and treat hypoglycemia.   Follow your care plan for blood pressure and elevated cholesterol if you have these problems.   Do your blood tests as directed. This is important and helps monitor your diabetes.   Check your feet every night for sores or wounds.   See your eye doctor once a year.   If you have an illness that causes loss of appetite, vomiting, or diarrhea, you should speak with your caregiver about temporary changes in your insulin doses and other medicines.  SEEK MEDICAL CARE IF:  You are having problems keeping your blood sugar at target range.   You are having episodes of hypoglycemia.   You are having side effects from your medicines.   You have symptoms of an illness that are not improving after 3 to 4 days.   You have a sore or wound that is not healing.   You notice a change in vision or a new problem with your vision.   You develop a fever of more than 100.5 F (38.1 C).  SEEK IMMEDIATE MEDICAL CARE IF:   Your blood sugar goes below 70, especially if you have confusion, lightheadedness, or other symptoms.   Your blood sugar is very high (as advised by your caregiver) twice in a row.   An unexplained oral temperature above 102 F (38.9 C) develops.   You pass out.   You have chest pain or trouble breathing.   You have a sudden, severe headache.   You have sudden weakness in one arm or leg.   You have sudden difficulty speaking or swallowing.   You develop vomiting or diarrhea that is getting worse or not improving after 1 day.  Document Released: 08/20/2008 Document Revised: 05/13/2011 Document Reviewed: 09/17/2010 [facility] Patient Information 2012 [place].

## 2011-08-23 NOTE — Progress Notes (Signed)
Inpatient Diabetes Program Recommendations  AACE/ADA: New Consensus Statement on Inpatient Glycemic Control (2009)  Target Ranges:  Prepandial:   less than 140 mg/dL      Peak postprandial:   less than 180 mg/dL (1-2 hours)      Critically ill patients:  140 - 180 mg/dL   Reason for Visit: Consult for Newly diagnosed DM Results for [name], [name] (MRN [id number]) as of 08/23/2011 12:10  Ref. Range 08/22/2011 07:09 08/22/2011 12:06 08/22/2011 17:19 08/22/2011 17:21 08/22/2011 18:23 08/22/2011 21:30 08/23/2011 07:28 08/23/2011 11:53  Glucose-Capillary Latest Range: 70-99 mg/dL 264 (H) 141 (H) 66 (L) 64 (L) 112 (H) 149 (H) 103 (H) 157 (H)  Results for [name], [name] (MRN [id number]) as of 08/23/2011 12:10  Ref. Range 08/21/2011 06:40  Hemoglobin A1C Latest Range: <5.7 % 12.5 (H)     Note: [name] is a 51 year old man with no PMH of DM who was admitted on 08/20/11 with hyperosmolar non ketotic hyperglycemia. He has been managed with an insulin drip and subsequently transitioned over to basal/bolus insulin therapy.  On Lantus 30 units bid and Novolog 8 units tidwc plus Novolog correction.  Pt feels comfortable with going home on insulin.  Discussed diagnosis of diabetes, insulin admin, diet, exercise, glucose monitoring and hypoglycemia treatment.  Pt has viewed diabetes videos on pt ed channel and I did order Living Well With Diabetes book for him.  Gave info regarding [employer] meter and discussed importance of followup with his PCP in the next week or two.  Received OP Diabetes Ed order and told pt they will call when he is discharged home to set up an appt with a CDE.  Pt verbalized understanding of all of the above.  Discussed with RN.

## 2011-08-31 ENCOUNTER — Other Ambulatory Visit (HOSPITAL_COMMUNITY): Payer: Self-pay | Admitting: *Deleted

## 2011-09-03 ENCOUNTER — Encounter (HOSPITAL_COMMUNITY)
Admission: RE | Admit: 2011-09-03 | Discharge: 2011-09-03 | Disposition: A | Discharge status: Home / Self Care | Payer: 59 | Source: Ambulatory Visit | Attending: Nephrology | Admitting: Nephrology

## 2011-09-03 ENCOUNTER — Encounter (HOSPITAL_COMMUNITY): Payer: Self-pay

## 2011-09-03 MED ORDER — SODIUM CHLORIDE 0.9 % IV SOLN
INTRAVENOUS | Status: DC
Start: 2011-09-03 — End: 2011-09-04
  Administered 2011-09-03: 12:00:00 via INTRAVENOUS

## 2011-09-03 MED ORDER — AGALSIDASE BETA 5 MG IV SOLR
115.0000 mg | INTRAVENOUS | Status: DC
  Administered 2011-09-03: 115 mg via INTRAVENOUS
  Filled 2011-09-03: qty 23

## 2011-09-03 NOTE — Discharge Instructions (Signed)
Agalsidase Beta injection What is this medicine? AGALSIDASE BETA is used to replace an enzyme that is missing in patients with Fabry disease. It is not a cure. This medicine may be used for other purposes; ask your health care provider or pharmacist if you have questions. What should I tell my health care provider before I take this medicine? They need to know if you have any of these conditions: -heart disease -an unusual or allergic reaction to agalsidase beta, mannitol, other medicines, foods, dyes, or preservatives -pregnant or trying to get pregnant -breast-feeding How should I use this medicine? This medicine is for infusion into a vein. It is given by a health care professional in a hospital or clinic setting. Talk to your pediatrician regarding the use of this medicine in children. Special care may be needed. Overdosage: If you think you have taken too much of this medicine contact a poison control center or emergency room at once. NOTE: This medicine is only for you. Do not share this medicine with others. What if I miss a dose? It is important not to miss your dose. Call your doctor or health care professional if you are unable to keep an appointment. What may interact with this medicine? -amiodarone -chloroquine -gentamicin -hydroxychloroquine -monobenzone This list may not describe all possible interactions. Give your health care provider a list of all the medicines, herbs, non-prescription drugs, or dietary supplements you use. Also tell them if you smoke, drink alcohol, or use illegal drugs. Some items may interact with your medicine. What should I watch for while using this medicine? Visit your doctor or health care professional for regular checks on your progress. Tell your doctor or healthcare professional if your symptoms do not start to get better or if they get worse. There is a registry for patients with Fabry disease. The registry is used to gather information about  the disease and its effects. Talk to your health care provider if you would like to join the registry. What side effects may I notice from receiving this medicine? Side effects that you should report to your doctor or health care professional as soon as possible: -allergic reactions like skin rash, itching or hives, swelling of the face, lips, or tongue -breathing problems -chest pain, tightness -depression -dizziness -fast, irregular heart beat -swelling of the arms or legs Side effects that usually do not require medical attention (report to your doctor or health care professional if they continue or are bothersome): -aches or pains -anxiety -fever or chills at the time of injection -headache -nausea, vomiting -stomach pain, upset This list may not describe all possible side effects. Call your doctor for medical advice about side effects. You may report side effects to FDA at 1-800-FDA-1088. Where should I keep my medicine? This drug is given in a hospital or clinic and will not be stored at home. NOTE: This sheet is a summary. It may not cover all possible information. If you have questions about this medicine, talk to your doctor, pharmacist, or health care provider.  2012, Elsevier/Gold Standard. (01/31/2006 12:25:00 PM)

## 2011-09-17 ENCOUNTER — Encounter (HOSPITAL_COMMUNITY): Payer: Self-pay

## 2011-09-17 ENCOUNTER — Encounter (HOSPITAL_COMMUNITY)
Admission: RE | Admit: 2011-09-17 | Discharge: 2011-09-17 | Disposition: A | Discharge status: Home / Self Care | Payer: 59 | Source: Ambulatory Visit | Attending: Nephrology | Admitting: Nephrology

## 2011-09-17 DIAGNOSIS — E756 Lipid storage disorder, unspecified: Secondary | ICD-10-CM | POA: Insufficient documentation

## 2011-09-17 LAB — URINALYSIS, ROUTINE W REFLEX MICROSCOPIC
Bilirubin Urine: NEGATIVE
Glucose, UA: NEGATIVE mg/dL
Hgb urine dipstick: NEGATIVE
Ketones, ur: NEGATIVE mg/dL
Leukocytes, UA: NEGATIVE
Nitrite: NEGATIVE
Protein, ur: NEGATIVE mg/dL
Specific Gravity, Urine: 1.007 (ref 1.005–1.030)
Urobilinogen, UA: 0.2 mg/dL (ref 0.0–1.0)
pH: 6.5 (ref 5.0–8.0)

## 2011-09-17 LAB — DIFFERENTIAL
Basophils Absolute: 0 10*3/uL (ref 0.0–0.1)
Basophils Relative: 0 % (ref 0–1)
Eosinophils Absolute: 0.3 10*3/uL (ref 0.0–0.7)
Eosinophils Relative: 5 % (ref 0–5)
Lymphocytes Relative: 38 % (ref 12–46)
Lymphs Abs: 2.1 10*3/uL (ref 0.7–4.0)
Monocytes Absolute: 0.6 10*3/uL (ref 0.1–1.0)
Monocytes Relative: 11 % (ref 3–12)
Neutro Abs: 2.6 10*3/uL (ref 1.7–7.7)
Neutrophils Relative %: 47 % (ref 43–77)

## 2011-09-17 LAB — LIPID PANEL
Cholesterol: 182 mg/dL (ref 0–200)
HDL: 39 mg/dL — ABNORMAL LOW (ref >39)
LDL Cholesterol: 115 mg/dL — ABNORMAL HIGH (ref 0–99)
Total CHOL/HDL Ratio: 4.7 RATIO
Triglycerides: 140 mg/dL (ref <150)
VLDL: 28 mg/dL (ref 0–40)

## 2011-09-17 LAB — CBC
HCT: 38.6 % — ABNORMAL LOW (ref 39.0–52.0)
Hemoglobin: 12.6 g/dL — ABNORMAL LOW (ref 13.0–17.0)
MCH: 29.9 pg (ref 26.0–34.0)
MCHC: 32.6 g/dL (ref 30.0–36.0)
MCV: 91.7 fL (ref 78.0–100.0)
Platelets: 203 10*3/uL (ref 150–400)
RBC: 4.21 MIL/uL — ABNORMAL LOW (ref 4.22–5.81)
RDW: 13.1 % (ref 11.5–15.5)
WBC: 5.6 10*3/uL (ref 4.0–10.5)

## 2011-09-17 LAB — COMPREHENSIVE METABOLIC PANEL
ALT: 17 U/L (ref 0–53)
AST: 20 U/L (ref 0–37)
Albumin: 2.7 g/dL — ABNORMAL LOW (ref 3.5–5.2)
Alkaline Phosphatase: 55 U/L (ref 39–117)
BUN: 16 mg/dL (ref 6–23)
CO2: 21 mEq/L (ref 19–32)
Calcium: 8.9 mg/dL (ref 8.4–10.5)
Chloride: 106 mEq/L (ref 96–112)
Creatinine, Ser: 1.29 mg/dL (ref 0.50–1.35)
GFR calc Af Amer: 73 mL/min — ABNORMAL LOW (ref >90)
GFR calc non Af Amer: 63 mL/min — ABNORMAL LOW (ref >90)
Glucose, Bld: 181 mg/dL — ABNORMAL HIGH (ref 70–99)
Potassium: 4.3 mEq/L (ref 3.5–5.1)
Sodium: 139 mEq/L (ref 135–145)
Total Bilirubin: 0.2 mg/dL — ABNORMAL LOW (ref 0.3–1.2)
Total Protein: 7.5 g/dL (ref 6.0–8.3)

## 2011-09-17 LAB — PHOSPHORUS: Phosphorus: 3.1 mg/dL (ref 2.3–4.6)

## 2011-09-17 LAB — IRON AND TIBC
Iron: 40 ug/dL — ABNORMAL LOW (ref 42–135)
Saturation Ratios: 18 % — ABNORMAL LOW (ref 20–55)
TIBC: 222 ug/dL (ref 215–435)
UIBC: 182 ug/dL (ref 125–400)

## 2011-09-17 MED ORDER — SODIUM CHLORIDE 0.9 % IV SOLN
115.0000 mg | INTRAVENOUS | Status: DC
  Administered 2011-09-17: 115 mg via INTRAVENOUS
  Filled 2011-09-17: qty 23

## 2011-09-17 MED ORDER — SODIUM CHLORIDE 0.9 % IV SOLN
INTRAVENOUS | Status: DC
  Administered 2011-09-17: 250 mL via INTRAVENOUS

## 2011-09-17 NOTE — Discharge Instructions (Signed)
Requests not to have all that paper work. Aware of instructions and knows follow up appt

## 2011-10-01 ENCOUNTER — Encounter (HOSPITAL_COMMUNITY): Payer: Self-pay

## 2011-10-01 ENCOUNTER — Encounter (HOSPITAL_COMMUNITY)
Admission: RE | Admit: 2011-10-01 | Discharge: 2011-10-01 | Disposition: A | Discharge status: Home / Self Care | Payer: 59 | Source: Ambulatory Visit | Attending: Nephrology | Admitting: Nephrology

## 2011-10-01 MED ORDER — SODIUM CHLORIDE 0.9 % IV SOLN
INTRAVENOUS | Status: DC
  Administered 2011-10-01: 11:00:00 via INTRAVENOUS

## 2011-10-01 MED ORDER — SODIUM CHLORIDE 0.9 % IV SOLN
115.0000 mg | INTRAVENOUS | Status: DC
  Administered 2011-10-01: 115 mg via INTRAVENOUS
  Filled 2011-10-01: qty 23

## 2011-10-01 NOTE — Discharge Instructions (Signed)
YOUR NEXT APPOINTMENT IN SHORT STAY IS Friday 10/15/11 AT 11AM Refer to printed sheet for next appointment. Short Stay Phone # [phone]

## 2011-10-01 NOTE — Progress Notes (Signed)
Pt states he took his Tylenol and Benadry at home at 1000 prior to arrival to Short Stay in anticipation for his Fabrazyme today

## 2011-10-15 ENCOUNTER — Encounter (HOSPITAL_COMMUNITY): Payer: Self-pay

## 2011-10-15 ENCOUNTER — Encounter (HOSPITAL_COMMUNITY)
Admission: RE | Admit: 2011-10-15 | Discharge: 2011-10-15 | Disposition: A | Discharge status: Home / Self Care | Payer: 59 | Source: Ambulatory Visit | Attending: Nephrology | Admitting: Nephrology

## 2011-10-15 DIAGNOSIS — E756 Lipid storage disorder, unspecified: Secondary | ICD-10-CM | POA: Insufficient documentation

## 2011-10-15 LAB — URINALYSIS, ROUTINE W REFLEX MICROSCOPIC
Bilirubin Urine: NEGATIVE
Glucose, UA: NEGATIVE mg/dL
Hgb urine dipstick: NEGATIVE
Ketones, ur: NEGATIVE mg/dL
Leukocytes, UA: NEGATIVE
Nitrite: NEGATIVE
Protein, ur: NEGATIVE mg/dL
Specific Gravity, Urine: 1.009 (ref 1.005–1.030)
Urobilinogen, UA: 0.2 mg/dL (ref 0.0–1.0)
pH: 6 (ref 5.0–8.0)

## 2011-10-15 LAB — PHOSPHORUS: Phosphorus: 3.2 mg/dL (ref 2.3–4.6)

## 2011-10-15 LAB — DIFFERENTIAL
Basophils Absolute: 0 10*3/uL (ref 0.0–0.1)
Basophils Relative: 0 % (ref 0–1)
Eosinophils Absolute: 0.3 10*3/uL (ref 0.0–0.7)
Eosinophils Relative: 4 % (ref 0–5)
Lymphocytes Relative: 36 % (ref 12–46)
Lymphs Abs: 2.3 10*3/uL (ref 0.7–4.0)
Monocytes Absolute: 0.8 10*3/uL (ref 0.1–1.0)
Monocytes Relative: 12 % (ref 3–12)
Neutro Abs: 3 10*3/uL (ref 1.7–7.7)
Neutrophils Relative %: 47 % (ref 43–77)

## 2011-10-15 LAB — COMPREHENSIVE METABOLIC PANEL
ALT: 20 U/L (ref 0–53)
AST: 22 U/L (ref 0–37)
Albumin: 3.1 g/dL — ABNORMAL LOW (ref 3.5–5.2)
Alkaline Phosphatase: 62 U/L (ref 39–117)
BUN: 19 mg/dL (ref 6–23)
CO2: 24 mEq/L (ref 19–32)
Calcium: 8.8 mg/dL (ref 8.4–10.5)
Chloride: 108 mEq/L (ref 96–112)
Creatinine, Ser: 1.29 mg/dL (ref 0.50–1.35)
GFR calc Af Amer: 73 mL/min — ABNORMAL LOW (ref >90)
GFR calc non Af Amer: 63 mL/min — ABNORMAL LOW (ref >90)
Glucose, Bld: 180 mg/dL — ABNORMAL HIGH (ref 70–99)
Potassium: 4 mEq/L (ref 3.5–5.1)
Sodium: 143 mEq/L (ref 135–145)
Total Bilirubin: 0.4 mg/dL (ref 0.3–1.2)
Total Protein: 7.4 g/dL (ref 6.0–8.3)

## 2011-10-15 LAB — CBC
HCT: 41.9 % (ref 39.0–52.0)
Hemoglobin: 13.7 g/dL (ref 13.0–17.0)
MCH: 29.4 pg (ref 26.0–34.0)
MCHC: 32.7 g/dL (ref 30.0–36.0)
MCV: 89.9 fL (ref 78.0–100.0)
Platelets: 170 10*3/uL (ref 150–400)
RBC: 4.66 MIL/uL (ref 4.22–5.81)
RDW: 13 % (ref 11.5–15.5)
WBC: 6.3 10*3/uL (ref 4.0–10.5)

## 2011-10-15 MED ORDER — SODIUM CHLORIDE 0.9 % IV SOLN
INTRAVENOUS | Status: DC
  Administered 2011-10-15: 12:00:00 via INTRAVENOUS

## 2011-10-15 MED ORDER — SODIUM CHLORIDE 0.9 % IV SOLN
115.0000 mg | INTRAVENOUS | Status: DC
  Administered 2011-10-15: 115 mg via INTRAVENOUS
  Filled 2011-10-15: qty 23

## 2011-10-15 NOTE — Progress Notes (Signed)
VP tech called IV lab draw

## 2011-10-15 NOTE — Discharge Instructions (Signed)
Fabry's Disease Fabry's disease is a fat storage disorder. It causes an abnormal deposit of a particular fatty substance in blood vessels walls. It is caused by a lack of an enzyme. The mother needs to be a carrier to produce an affected child. Her sons have a 4 percent chance of having the condition, and her daughters have a 44 percent chance of being a carrier. Some of the male carriers exhibit signs of the condition, especially cloudiness of the cornea.  Symptoms males have include:  Eye problems.   Burning sensations (feeling) in their hands and feet that is worse with exercise and hot weather.   Small, raised, reddish-purple blemishes on their skin.  As they grow older, they may have impaired arterial circulation leading to early heart attacks and strokes. The kidneys become progressively involved. Many patients require kidney transplantation or dialysis. A number of patients have trouble with their digestive track. Symptoms of this include frequent bowel movements shortly after eating.  TREATMENT  The pain in the hands and feet usually responds to medicine such as Tegretol (carbamazepine) and dilantin. Gastrointestinal hyperactivity may be treated with metoclopramide or Lipisorb (a nutritional supplement). Recent experiments indicate that enzyme replacement is works for patients with this disorder. PROGNOSIS Patients with Fabry's disease usually survive into adulthood. They are at risk for:  Strokes.   Heart attacks.   Kidney damage.  The hope is that enzyme replacement and gene therapy will eliminate these difficulties. Document Released: 05/14/2002 Document Revised: 05/13/2011 Document Reviewed: 09/28/2007 [facility] Patient Information 2012 [place].

## 2011-10-16 LAB — IRON AND TIBC
Iron: 128 ug/dL (ref 42–135)
Saturation Ratios: 52 % (ref 20–55)
TIBC: 247 ug/dL (ref 215–435)
UIBC: 119 ug/dL — ABNORMAL LOW (ref 125–400)

## 2011-10-16 LAB — FERRITIN: Ferritin: 341 ng/mL — ABNORMAL HIGH (ref 22–322)

## 2011-10-29 ENCOUNTER — Encounter (HOSPITAL_COMMUNITY)
Admission: RE | Admit: 2011-10-29 | Discharge: 2011-10-29 | Disposition: A | Discharge status: Home / Self Care | Payer: 59 | Source: Ambulatory Visit | Attending: Nephrology | Admitting: Nephrology

## 2011-10-29 DIAGNOSIS — E756 Lipid storage disorder, unspecified: Secondary | ICD-10-CM | POA: Insufficient documentation

## 2011-10-29 MED ORDER — SODIUM CHLORIDE 0.9 % IV SOLN
115.0000 mg | INTRAVENOUS | Status: DC
  Administered 2011-10-29: 115 mg via INTRAVENOUS
  Filled 2011-10-29: qty 23

## 2011-10-29 MED ORDER — SODIUM CHLORIDE 0.9 % IV SOLN
INTRAVENOUS | Status: DC
  Administered 2011-10-29: 11:00:00 via INTRAVENOUS

## 2011-10-29 NOTE — Discharge Instructions (Signed)
YOUR NEXT APPOINTMENT IN SHORT STAY FOR EVERY 2 WEEKS FABRAZYME ARE  Friday 11/12/11 AT 1:30 PM Friday  11/26/11 AT 51      Fabry's Disease Fabry's disease is a fat storage disorder. It causes an abnormal deposit of a particular fatty substance in blood vessels walls. It is caused by a lack of an enzyme. The mother needs to be a carrier to produce an affected child. Her sons have a 100 percent chance of having the condition, and her daughters have a 92 percent chance of being a carrier. Some of the male carriers exhibit signs of the condition, especially cloudiness of the cornea.  Symptoms males have include:  Eye problems.   Burning sensations (feeling) in their hands and feet that is worse with exercise and hot weather.   Small, raised, reddish-purple blemishes on their skin.  As they grow older, they may have impaired arterial circulation leading to early heart attacks and strokes. The kidneys become progressively involved. Many patients require kidney transplantation or dialysis. A number of patients have trouble with their digestive track. Symptoms of this include frequent bowel movements shortly after eating.  TREATMENT  The pain in the hands and feet usually responds to medicine such as Tegretol (carbamazepine) and dilantin. Gastrointestinal hyperactivity may be treated with metoclopramide or Lipisorb (a nutritional supplement). Recent experiments indicate that enzyme replacement is works for patients with this disorder. PROGNOSIS Patients with Fabry's disease usually survive into adulthood. They are at risk for:  Strokes.   Heart attacks.   Kidney damage.  The hope is that enzyme replacement and gene therapy will eliminate these difficulties. Document Released: 05/14/2002 Document Revised: 05/13/2011 Document Reviewed: 09/28/2007 [facility] Patient Information 2012 [place].

## 2011-10-29 NOTE — Progress Notes (Addendum)
Pt states he notices that the swelling in feet and legs are increasing in the last 2 weeks 2+ pitting edema of feet and ankles. Pt denies any shortness of breath.. Pt will be calling Dr Arty Baumgartner this afternoon to schedule an appt. Pt states he has been taking his spirolactone  at the same dosage as directed

## 2011-11-12 ENCOUNTER — Encounter (HOSPITAL_COMMUNITY): Admission: RE | Admit: 2011-11-12 | Payer: 59 | Source: Ambulatory Visit

## 2011-11-26 ENCOUNTER — Encounter (HOSPITAL_COMMUNITY): Payer: Self-pay

## 2011-11-26 ENCOUNTER — Encounter (HOSPITAL_COMMUNITY)
Admission: RE | Admit: 2011-11-26 | Discharge: 2011-11-26 | Disposition: A | Discharge status: Home / Self Care | Payer: 59 | Source: Ambulatory Visit | Attending: Nephrology | Admitting: Nephrology

## 2011-11-26 DIAGNOSIS — E756 Lipid storage disorder, unspecified: Secondary | ICD-10-CM | POA: Insufficient documentation

## 2011-11-26 LAB — URINE MICROSCOPIC-ADD ON

## 2011-11-26 LAB — URINALYSIS, ROUTINE W REFLEX MICROSCOPIC
Bilirubin Urine: NEGATIVE
Glucose, UA: NEGATIVE mg/dL
Hgb urine dipstick: NEGATIVE
Ketones, ur: NEGATIVE mg/dL
Leukocytes, UA: NEGATIVE
Nitrite: NEGATIVE
Protein, ur: 100 mg/dL — AB
Specific Gravity, Urine: 1.016 (ref 1.005–1.030)
Urobilinogen, UA: 0.2 mg/dL (ref 0.0–1.0)
pH: 5.5 (ref 5.0–8.0)

## 2011-11-26 LAB — COMPREHENSIVE METABOLIC PANEL
ALT: 21 U/L (ref 0–53)
AST: 22 U/L (ref 0–37)
Albumin: 3.1 g/dL — ABNORMAL LOW (ref 3.5–5.2)
Alkaline Phosphatase: 70 U/L (ref 39–117)
BUN: 14 mg/dL (ref 6–23)
CO2: 20 mEq/L (ref 19–32)
Calcium: 8.8 mg/dL (ref 8.4–10.5)
Chloride: 109 mEq/L (ref 96–112)
Creatinine, Ser: 1.3 mg/dL (ref 0.50–1.35)
GFR calc Af Amer: 73 mL/min — ABNORMAL LOW (ref >90)
GFR calc non Af Amer: 63 mL/min — ABNORMAL LOW (ref >90)
Glucose, Bld: 132 mg/dL — ABNORMAL HIGH (ref 70–99)
Potassium: 3.9 mEq/L (ref 3.5–5.1)
Sodium: 140 mEq/L (ref 135–145)
Total Bilirubin: 0.2 mg/dL — ABNORMAL LOW (ref 0.3–1.2)
Total Protein: 7.5 g/dL (ref 6.0–8.3)

## 2011-11-26 LAB — PHOSPHORUS: Phosphorus: 3.7 mg/dL (ref 2.3–4.6)

## 2011-11-26 MED ORDER — SODIUM CHLORIDE 0.9 % IV SOLN
INTRAVENOUS | Status: DC
  Administered 2011-11-26: 11:00:00 via INTRAVENOUS

## 2011-11-26 MED ORDER — SODIUM CHLORIDE 0.9 % IV SOLN
115.0000 mg | INTRAVENOUS | Status: DC
  Administered 2011-11-26: 115 mg via INTRAVENOUS
  Filled 2011-11-26: qty 23

## 2011-11-26 NOTE — Discharge Instructions (Signed)
Refer to printed sheet for next appointment. Short Stay Phone # [phone] YOUR NEXT APPOINTMENT IN SHORT STAY FOR EVERY 2 WEEKS FABRAZYME ARE 12/10/11 Friday AT 9:30AM 12/24/11 Friday AT 9:30AM

## 2011-12-10 ENCOUNTER — Encounter (HOSPITAL_COMMUNITY): Payer: Self-pay

## 2011-12-10 ENCOUNTER — Encounter (HOSPITAL_COMMUNITY)
Admission: RE | Admit: 2011-12-10 | Discharge: 2011-12-10 | Disposition: A | Discharge status: Home / Self Care | Payer: 59 | Source: Ambulatory Visit | Attending: Nephrology | Admitting: Nephrology

## 2011-12-10 DIAGNOSIS — E756 Lipid storage disorder, unspecified: Secondary | ICD-10-CM | POA: Insufficient documentation

## 2011-12-10 MED ORDER — SODIUM CHLORIDE 0.9 % IV SOLN
INTRAVENOUS | Status: DC
  Administered 2011-12-10: 09:00:00 via INTRAVENOUS

## 2011-12-10 MED ORDER — SODIUM CHLORIDE 0.9 % IV SOLN
115.0000 mg | INTRAVENOUS | Status: DC
  Administered 2011-12-10: 115 mg via INTRAVENOUS
  Filled 2011-12-10: qty 23

## 2011-12-10 NOTE — Progress Notes (Signed)
0910 Took pre meds at home Tylenol 500 mg and Allegra 180 mg at 0830

## 2011-12-24 ENCOUNTER — Encounter (HOSPITAL_COMMUNITY): Payer: Self-pay

## 2011-12-24 ENCOUNTER — Encounter (HOSPITAL_COMMUNITY)
Admission: RE | Admit: 2011-12-24 | Discharge: 2011-12-24 | Disposition: A | Discharge status: Home / Self Care | Payer: 59 | Source: Ambulatory Visit | Attending: Nephrology | Admitting: Nephrology

## 2011-12-24 HISTORY — DX: Gout, unspecified: M10.9

## 2011-12-24 MED ORDER — SODIUM CHLORIDE 0.9 % IV SOLN
INTRAVENOUS | Status: DC
  Administered 2011-12-24: 10:00:00 via INTRAVENOUS

## 2011-12-24 MED ORDER — SODIUM CHLORIDE 0.9 % IV SOLN
115.0000 mg | INTRAVENOUS | Status: DC
  Administered 2011-12-24: 115 mg via INTRAVENOUS
  Filled 2011-12-24: qty 23

## 2011-12-24 NOTE — Progress Notes (Signed)
  Called Dr [employer] office and confirmed pt had all labs done there 12/06/11. Will not do any labs today. (Per Jan at [phone]).

## 2012-01-05 ENCOUNTER — Other Ambulatory Visit (HOSPITAL_COMMUNITY): Payer: Self-pay | Admitting: *Deleted

## 2012-01-06 ENCOUNTER — Encounter (HOSPITAL_COMMUNITY): Payer: 59

## 2012-01-07 ENCOUNTER — Other Ambulatory Visit (HOSPITAL_COMMUNITY): Payer: Self-pay | Admitting: *Deleted

## 2012-01-07 ENCOUNTER — Encounter (HOSPITAL_COMMUNITY): Payer: Self-pay

## 2012-01-07 ENCOUNTER — Encounter (HOSPITAL_COMMUNITY)
Admission: RE | Admit: 2012-01-07 | Discharge: 2012-01-07 | Disposition: A | Discharge status: Home / Self Care | Payer: 59 | Source: Ambulatory Visit | Attending: Nephrology | Admitting: Nephrology

## 2012-01-07 DIAGNOSIS — E756 Lipid storage disorder, unspecified: Secondary | ICD-10-CM | POA: Insufficient documentation

## 2012-01-07 LAB — COMPREHENSIVE METABOLIC PANEL
ALT: 20 U/L (ref 0–53)
AST: 24 U/L (ref 0–37)
Albumin: 3.1 g/dL — ABNORMAL LOW (ref 3.5–5.2)
Alkaline Phosphatase: 67 U/L (ref 39–117)
BUN: 13 mg/dL (ref 6–23)
CO2: 24 mEq/L (ref 19–32)
Calcium: 9.4 mg/dL (ref 8.4–10.5)
Chloride: 104 mEq/L (ref 96–112)
Creatinine, Ser: 1.07 mg/dL (ref 0.50–1.35)
GFR calc Af Amer: >90 mL/min (ref >90)
GFR calc non Af Amer: 79 mL/min — ABNORMAL LOW (ref >90)
Glucose, Bld: 122 mg/dL — ABNORMAL HIGH (ref 70–99)
Potassium: 3.9 mEq/L (ref 3.5–5.1)
Sodium: 138 mEq/L (ref 135–145)
Total Bilirubin: 0.2 mg/dL — ABNORMAL LOW (ref 0.3–1.2)
Total Protein: 8.4 g/dL — ABNORMAL HIGH (ref 6.0–8.3)

## 2012-01-07 LAB — URINALYSIS, ROUTINE W REFLEX MICROSCOPIC
Bilirubin Urine: NEGATIVE
Glucose, UA: NEGATIVE mg/dL
Hgb urine dipstick: NEGATIVE
Ketones, ur: NEGATIVE mg/dL
Leukocytes, UA: NEGATIVE
Nitrite: NEGATIVE
Protein, ur: 30 mg/dL — AB
Specific Gravity, Urine: 1.013 (ref 1.005–1.030)
Urobilinogen, UA: 0.2 mg/dL (ref 0.0–1.0)
pH: 5.5 (ref 5.0–8.0)

## 2012-01-07 LAB — URINE MICROSCOPIC-ADD ON

## 2012-01-07 LAB — PHOSPHORUS: Phosphorus: 3.8 mg/dL (ref 2.3–4.6)

## 2012-01-07 MED ORDER — AGALSIDASE BETA 5 MG IV SOLR
115.0000 mg | INTRAVENOUS | Status: DC
  Administered 2012-01-07: 115 mg via INTRAVENOUS
  Filled 2012-01-07: qty 23

## 2012-01-07 MED ORDER — SODIUM CHLORIDE 0.9 % IV SOLN
INTRAVENOUS | Status: DC
  Administered 2012-01-07: 09:00:00 via INTRAVENOUS

## 2012-01-07 NOTE — Progress Notes (Signed)
Patient states he took his Tylenol and Allegra at home 1 hour before infusion.

## 2012-01-21 ENCOUNTER — Encounter (HOSPITAL_COMMUNITY)
Admission: RE | Admit: 2012-01-21 | Discharge: 2012-01-21 | Disposition: A | Discharge status: Home / Self Care | Payer: 59 | Source: Ambulatory Visit | Attending: Nephrology | Admitting: Nephrology

## 2012-01-21 ENCOUNTER — Encounter (HOSPITAL_COMMUNITY): Payer: Self-pay

## 2012-01-21 MED ORDER — SODIUM CHLORIDE 0.9 % IV SOLN
INTRAVENOUS | Status: DC
  Administered 2012-01-21: 10:00:00 via INTRAVENOUS

## 2012-01-21 MED ORDER — SODIUM CHLORIDE 0.9 % IV SOLN
115.0000 mg | INTRAVENOUS | Status: DC
  Administered 2012-01-21: 115 mg via INTRAVENOUS
  Filled 2012-01-21: qty 23

## 2012-01-21 NOTE — Progress Notes (Signed)
Pt  tolerated procedure well.

## 2012-02-04 ENCOUNTER — Encounter (HOSPITAL_COMMUNITY)
Admission: RE | Admit: 2012-02-04 | Discharge: 2012-02-04 | Disposition: A | Discharge status: Home / Self Care | Payer: 59 | Source: Ambulatory Visit | Attending: Nephrology | Admitting: Nephrology

## 2012-02-04 ENCOUNTER — Encounter (HOSPITAL_COMMUNITY): Payer: Self-pay

## 2012-02-04 LAB — COMPREHENSIVE METABOLIC PANEL
ALT: 28 U/L (ref 0–53)
AST: 39 U/L — ABNORMAL HIGH (ref 0–37)
Albumin: 3.2 g/dL — ABNORMAL LOW (ref 3.5–5.2)
Alkaline Phosphatase: 60 U/L (ref 39–117)
BUN: 12 mg/dL (ref 6–23)
CO2: 24 mEq/L (ref 19–32)
Calcium: 9.1 mg/dL (ref 8.4–10.5)
Chloride: 103 mEq/L (ref 96–112)
Creatinine, Ser: 1.05 mg/dL (ref 0.50–1.35)
GFR calc Af Amer: >90 mL/min (ref >90)
GFR calc non Af Amer: 80 mL/min — ABNORMAL LOW (ref >90)
Glucose, Bld: 122 mg/dL — ABNORMAL HIGH (ref 70–99)
Potassium: 5.1 mEq/L (ref 3.5–5.1)
Sodium: 137 mEq/L (ref 135–145)
Total Bilirubin: 0.4 mg/dL (ref 0.3–1.2)
Total Protein: 8.1 g/dL (ref 6.0–8.3)

## 2012-02-04 LAB — URINE MICROSCOPIC-ADD ON

## 2012-02-04 LAB — URINALYSIS, ROUTINE W REFLEX MICROSCOPIC
Bilirubin Urine: NEGATIVE
Glucose, UA: NEGATIVE mg/dL
Hgb urine dipstick: NEGATIVE
Ketones, ur: NEGATIVE mg/dL
Leukocytes, UA: NEGATIVE
Nitrite: NEGATIVE
Protein, ur: 30 mg/dL — AB
Specific Gravity, Urine: 1.012 (ref 1.005–1.030)
Urobilinogen, UA: 0.2 mg/dL (ref 0.0–1.0)
pH: 6 (ref 5.0–8.0)

## 2012-02-04 LAB — PHOSPHORUS: Phosphorus: 3.9 mg/dL (ref 2.3–4.6)

## 2012-02-04 MED ORDER — SODIUM CHLORIDE 0.9 % IV SOLN
115.0000 mg | INTRAVENOUS | Status: DC
  Administered 2012-02-04: 115 mg via INTRAVENOUS
  Filled 2012-02-04: qty 23

## 2012-02-04 MED ORDER — SODIUM CHLORIDE 0.9 % IV SOLN
INTRAVENOUS | Status: DC
Start: 2012-02-04 — End: 2012-02-05
  Administered 2012-02-04: 10:00:00 via INTRAVENOUS

## 2012-02-04 NOTE — Progress Notes (Signed)
Patient states he took Pre infusion meds. Tylenol and Alegra at home

## 2012-02-18 ENCOUNTER — Encounter (HOSPITAL_COMMUNITY)
Admission: RE | Admit: 2012-02-18 | Discharge: 2012-02-18 | Disposition: A | Discharge status: Home / Self Care | Payer: 59 | Source: Ambulatory Visit | Attending: Nephrology | Admitting: Nephrology

## 2012-02-18 ENCOUNTER — Encounter (HOSPITAL_COMMUNITY): Payer: Self-pay

## 2012-02-18 DIAGNOSIS — E756 Lipid storage disorder, unspecified: Secondary | ICD-10-CM | POA: Insufficient documentation

## 2012-02-18 MED ORDER — SODIUM CHLORIDE 0.9 % IV SOLN
INTRAVENOUS | Status: DC
Start: 2012-02-18 — End: 2012-02-19
  Administered 2012-02-18: 09:00:00 via INTRAVENOUS

## 2012-02-18 MED ORDER — SODIUM CHLORIDE 0.9 % IV SOLN
115.0000 mg | INTRAVENOUS | Status: DC
  Administered 2012-02-18: 115 mg via INTRAVENOUS
  Filled 2012-02-18: qty 23

## 2012-02-18 NOTE — Progress Notes (Signed)
Patient took his Tylenol and Allegra pre meds at home this am prior to [facility] infusion.

## 2012-02-23 ENCOUNTER — Encounter: Payer: Self-pay | Admitting: *Deleted

## 2012-02-23 DIAGNOSIS — Z950 Presence of cardiac pacemaker: Secondary | ICD-10-CM | POA: Insufficient documentation

## 2012-02-29 ENCOUNTER — Encounter (INDEPENDENT_AMBULATORY_CARE_PROVIDER_SITE_OTHER): Payer: Self-pay | Admitting: General Surgery

## 2012-02-29 ENCOUNTER — Ambulatory Visit (INDEPENDENT_AMBULATORY_CARE_PROVIDER_SITE_OTHER): Payer: 59 | Admitting: General Surgery

## 2012-02-29 VITALS — BP 128/82 | HR 72 | Temp 98.0°F | Resp 18 | Ht 69.0 in | Wt 256.0 lb

## 2012-02-29 DIAGNOSIS — K429 Umbilical hernia without obstruction or gangrene: Secondary | ICD-10-CM

## 2012-02-29 NOTE — Patient Instructions (Signed)
You have an umbilical hernia.  We will schedule this as soon as we get clearance from your cardiologist.  We have given you a booklet on hernias for you to read.

## 2012-02-29 NOTE — Progress Notes (Signed)
Chief Complaint  Patient presents with  . Umbilical Hernia    HISTORY:  [name] is a 51 y.o. male who presents to clinic with an umbilical mass.  He denies any pain.  His PCP would like it evaluated.  This has been occurring for months to years.    Past Medical History  Diagnosis Date  . Second degree Mobitz II AV block     with syncope, s/p PPM  . Fabry's disease   . Lymph edema     lower legs - has wrapped at wound center 3x/week   . Hypertension   . Obesity   . Bifascicular block   . Fabry's disease   . Diabetes mellitus   . Arthritis   . Gout, arthritis 11/2011    bil feet. right worse  . Shortness of breath     short of breath on exertion  . Pacemaker     02/12/11  . Asthma      Past Surgical History  Procedure Date  . Cardiac catheterization   . Pacemaker insertion 02/12/11    SJM implanted by Dr Rayann Heman for Mobitz II second degree AV block and syncope  . Insert / replace / remove pacemaker     02/12/11      Current Outpatient Prescriptions  Medication Sig Dispense Refill  . acetaminophen (TYLENOL) 500 MG tablet Take 500 mg by mouth once. AS premed for Fabrazyme      . Agalsidase beta (FABRAZYME IV) Inject into the vein every 14 (fourteen) days.      [name] Kitchen allopurinol (ZYLOPRIM) 100 MG tablet Take 100 mg by mouth daily.      [name] Kitchen aspirin 81 MG tablet Take 81 mg by mouth daily.        . Blood Glucose Monitoring Suppl (BLOOD GLUCOSE METER) kit Use as instructed  1 each  0  . colchicine (COLCRYS) 0.6 MG tablet Take 0.6 mg by mouth daily.      . fexofenadine (ALLEGRA) 60 MG tablet Take 60 mg by mouth once.      . glyBURIDE (DIABETA) 5 MG tablet Take 5 mg by mouth daily with breakfast.      . insulin aspart (NOVOLOG) 100 UNIT/ML injection Inject 8 Units into the skin 3 (three) times daily with meals.  1 vial  5  . insulin glargine (LANTUS) 100 UNIT/ML injection Inject 60 Units into the skin at bedtime.  10 mL  12  . Insulin Syringes, Disposable, U-100 1 ML MISC 1 Syringe  by Does not apply route QID.  100 each  5  . losartan (COZAAR) 25 MG tablet Take 25 mg by mouth 2 (two) times daily.      [name] Kitchen spironolactone (ALDACTONE) 25 MG tablet Take 25 mg by mouth 2 (two) times daily.      [name] Kitchen DISCONTD: furosemide (LASIX) 20 MG tablet Take 80 mg by mouth daily.          Allergies  Allergen Reactions  . Shellfish Allergy       FH: noncontributory  History   Social History  . Marital Status: Single    Spouse Name: N/A    Number of Children: N/A  . Years of Education: N/A   Social History Main Topics  . Smoking status: Never Smoker   . Smokeless tobacco: Never Used  . Alcohol Use: Yes  . Drug Use: No  . Sexually Active: None   Other Topics Concern  . None   Social History Narrative  .  None     REVIEW OF SYSTEMS - PERTINENT POSITIVES ONLY: Review of Systems - General ROS: negative for - chills, fever, weight gain or weight loss Hematological and Lymphatic ROS: negative for - bleeding problems, blood clots or bruising Respiratory ROS: positive for - wheezing negative for - cough Cardiovascular ROS: positive for - edema and shortness of breath negative for - chest pain Gastrointestinal ROS: no abdominal pain, change in bowel habits, or black or bloody stools Genito-Urinary ROS: no dysuria, trouble voiding, or hematuria Musculoskeletal ROS: positive for - joint swelling  EXAM: Filed Vitals:   02/29/12 0950  BP: 128/82  Pulse: 72  Temp: 98 F (36.7 C)  Resp: 18    General appearance: alert, cooperative and no distress Resp: clear to auscultation bilaterally Cardio: regular rate and rhythm GI: soft, non-tender; bowel sounds normal; no masses,  no organomegaly [id number] umbilical hernia   ASSESSMENT AND PLAN: [name] is a 51 y.o. male with an umbilical hernia.  The risks and benefits of surgical repair vs watching and waiting were explained to the patient.  He has elected to proceed with surgery.  We will schedule this at his convenience  once cardiac evaluation is obtained.    Rosario Adie, MD Colon and Rectal Surgery / [facility]      Visit Diagnoses: 1. Umbilical hernia     Primary Care Physician: Elizabeth Palau, MD

## 2012-03-01 ENCOUNTER — Other Ambulatory Visit (HOSPITAL_COMMUNITY): Payer: Self-pay | Admitting: *Deleted

## 2012-03-03 ENCOUNTER — Telehealth (INDEPENDENT_AMBULATORY_CARE_PROVIDER_SITE_OTHER): Payer: Self-pay

## 2012-03-03 ENCOUNTER — Encounter (HOSPITAL_COMMUNITY): Payer: Self-pay

## 2012-03-03 ENCOUNTER — Encounter (HOSPITAL_COMMUNITY)
Admission: RE | Admit: 2012-03-03 | Discharge: 2012-03-03 | Disposition: A | Discharge status: Home / Self Care | Payer: 59 | Source: Ambulatory Visit | Attending: Nephrology | Admitting: Nephrology

## 2012-03-03 LAB — CBC WITH DIFFERENTIAL/PLATELET
Basophils Absolute: 0 10*3/uL (ref 0.0–0.1)
Basophils Relative: 0 % (ref 0–1)
Eosinophils Absolute: 0.5 10*3/uL (ref 0.0–0.7)
Eosinophils Relative: 7 % — ABNORMAL HIGH (ref 0–5)
HCT: 42.4 % (ref 39.0–52.0)
Hemoglobin: 14.2 g/dL (ref 13.0–17.0)
Lymphocytes Relative: 44 % (ref 12–46)
Lymphs Abs: 2.9 10*3/uL (ref 0.7–4.0)
MCH: 29.7 pg (ref 26.0–34.0)
MCHC: 33.5 g/dL (ref 30.0–36.0)
MCV: 88.7 fL (ref 78.0–100.0)
Monocytes Absolute: 0.5 10*3/uL (ref 0.1–1.0)
Monocytes Relative: 8 % (ref 3–12)
Neutro Abs: 2.7 10*3/uL (ref 1.7–7.7)
Neutrophils Relative %: 41 % — ABNORMAL LOW (ref 43–77)
Platelets: 168 10*3/uL (ref 150–400)
RBC: 4.78 MIL/uL (ref 4.22–5.81)
RDW: 13.5 % (ref 11.5–15.5)
WBC: 6.5 10*3/uL (ref 4.0–10.5)

## 2012-03-03 LAB — COMPREHENSIVE METABOLIC PANEL
ALT: 37 U/L (ref 0–53)
AST: 30 U/L (ref 0–37)
Albumin: 3.2 g/dL — ABNORMAL LOW (ref 3.5–5.2)
Alkaline Phosphatase: 73 U/L (ref 39–117)
BUN: 15 mg/dL (ref 6–23)
CO2: 24 mEq/L (ref 19–32)
Calcium: 9.2 mg/dL (ref 8.4–10.5)
Chloride: 106 mEq/L (ref 96–112)
Creatinine, Ser: 1.23 mg/dL (ref 0.50–1.35)
GFR calc Af Amer: 77 mL/min — ABNORMAL LOW (ref >90)
GFR calc non Af Amer: 66 mL/min — ABNORMAL LOW (ref >90)
Glucose, Bld: 127 mg/dL — ABNORMAL HIGH (ref 70–99)
Potassium: 4 mEq/L (ref 3.5–5.1)
Sodium: 142 mEq/L (ref 135–145)
Total Bilirubin: 0.3 mg/dL (ref 0.3–1.2)
Total Protein: 7.7 g/dL (ref 6.0–8.3)

## 2012-03-03 LAB — URINALYSIS, ROUTINE W REFLEX MICROSCOPIC
Bilirubin Urine: NEGATIVE
Glucose, UA: NEGATIVE mg/dL
Hgb urine dipstick: NEGATIVE
Ketones, ur: NEGATIVE mg/dL
Leukocytes, UA: NEGATIVE
Nitrite: NEGATIVE
Protein, ur: NEGATIVE mg/dL
Specific Gravity, Urine: 1.011 (ref 1.005–1.030)
Urobilinogen, UA: 0.2 mg/dL (ref 0.0–1.0)
pH: 6 (ref 5.0–8.0)

## 2012-03-03 LAB — IRON AND TIBC
Iron: 81 ug/dL (ref 42–135)
Saturation Ratios: 30 % (ref 20–55)
TIBC: 266 ug/dL (ref 215–435)
UIBC: 185 ug/dL (ref 125–400)

## 2012-03-03 LAB — CHOLESTEROL, TOTAL: Cholesterol: 218 mg/dL — ABNORMAL HIGH (ref 0–200)

## 2012-03-03 LAB — PHOSPHORUS: Phosphorus: 4.1 mg/dL (ref 2.3–4.6)

## 2012-03-03 MED ORDER — SODIUM CHLORIDE 0.9 % IV SOLN
INTRAVENOUS | Status: DC
  Administered 2012-03-03: 10:00:00 via INTRAVENOUS

## 2012-03-03 MED ORDER — SODIUM CHLORIDE 0.9 % IV SOLN
115.0000 mg | INTRAVENOUS | Status: DC
  Administered 2012-03-03: 115 mg via INTRAVENOUS
  Filled 2012-03-03: qty 23

## 2012-03-03 NOTE — Telephone Encounter (Signed)
I called the pt to let him know we got his cardiac clearance.  I will not be here this afternoon so I told him to ask for [facility].  We need to address his aspirin too.  We have the posting sheet already and it's with [facility].

## 2012-03-06 ENCOUNTER — Encounter: Payer: Self-pay | Admitting: Internal Medicine

## 2012-03-06 ENCOUNTER — Ambulatory Visit (INDEPENDENT_AMBULATORY_CARE_PROVIDER_SITE_OTHER): Payer: 59 | Admitting: Internal Medicine

## 2012-03-06 VITALS — BP 141/89 | HR 71 | Ht 69.0 in | Wt 257.0 lb

## 2012-03-06 DIAGNOSIS — I441 Atrioventricular block, second degree: Secondary | ICD-10-CM

## 2012-03-06 DIAGNOSIS — R0600 Dyspnea, unspecified: Secondary | ICD-10-CM

## 2012-03-06 DIAGNOSIS — I10 Essential (primary) hypertension: Secondary | ICD-10-CM

## 2012-03-06 DIAGNOSIS — Z950 Presence of cardiac pacemaker: Secondary | ICD-10-CM

## 2012-03-06 DIAGNOSIS — Z8679 Personal history of other diseases of the circulatory system: Secondary | ICD-10-CM

## 2012-03-06 LAB — PACEMAKER DEVICE OBSERVATION
AL AMPLITUDE: 5 mv
AL IMPEDENCE PM: 450 Ohm
AL THRESHOLD: 0.625 V
ATRIAL PACING PM: 29
BAMS-0001: 150 {beats}/min
BAMS-0003: 70 {beats}/min
BATTERY VOLTAGE: 2.9478 V
DEVICE MODEL PM: 7267950
RV LEAD AMPLITUDE: 12 mv
RV LEAD IMPEDENCE PM: 437.5 Ohm
RV LEAD THRESHOLD: 0.875 V
VENTRICULAR PACING PM: 19

## 2012-03-06 NOTE — Progress Notes (Signed)
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  Since last being seen in our clinic, the patient reports doing very well.  Today, he denies symptoms of palpitations, chest pain, shortness of breath (above baseline),  dizziness, presyncope, or syncope.  His edema is unchanged.  The patient is otherwise without complaint today.   Past Medical History  Diagnosis Date  . Second degree Mobitz II AV block     with syncope, s/p PPM  . Fabry's disease   . Lymph edema     lower legs - has wrapped at wound center 3x/week   . Hypertension   . Obesity   . Bifascicular block   . Fabry's disease   . Diabetes mellitus   . Arthritis   . Gout, arthritis 11/2011    bil feet. right worse  . Shortness of breath     short of breath on exertion  . Pacemaker     02/12/11  . Asthma    Past Surgical History  Procedure Date  . Cardiac catheterization   . Pacemaker insertion 02/12/11    SJM implanted by Dr Rayann Heman for Mobitz II second degree AV block and syncope  . Insert / replace / remove pacemaker     02/12/11    Current Outpatient Prescriptions  Medication Sig Dispense Refill  . acetaminophen (TYLENOL) 500 MG tablet Take 500 mg by mouth once. AS premed for Fabrazyme      . Agalsidase beta (FABRAZYME IV) Inject into the vein every 14 (fourteen) days.      [name] Kitchen allopurinol (ZYLOPRIM) 100 MG tablet Take 100 mg by mouth daily.      [name] Kitchen aspirin 81 MG tablet Take 81 mg by mouth daily.        . Blood Glucose Monitoring Suppl (BLOOD GLUCOSE METER) kit Use as instructed  1 each  0  . colchicine (COLCRYS) 0.6 MG tablet Take 0.6 mg by mouth daily.      . fexofenadine (ALLEGRA) 60 MG tablet Take 60 mg by mouth once.      . glyBURIDE (DIABETA) 5 MG tablet Take 5 mg by mouth daily with breakfast.      . Insulin Syringes, Disposable, U-100 1 ML MISC 1 Syringe by Does not apply route QID.  100 each  5  . losartan  (COZAAR) 25 MG tablet Take 25 mg by mouth daily.       [name] Kitchen spironolactone (ALDACTONE) 25 MG tablet Take 25 mg by mouth daily.       [name] Kitchen DISCONTD: furosemide (LASIX) 20 MG tablet Take 80 mg by mouth daily.         Physical Exam: Filed Vitals:   03/06/12 0849  BP: 141/89  Pulse: 71  Height: 5\' 9"  (1.753 m)  Weight: 257 lb (116.574 kg)  SpO2: 98%    GEN- The patient is well appearing, alert and oriented x 3 today.   Head- normocephalic, atraumatic Eyes-  Sclera clear, conjunctiva pink Ears- hearing intact Oropharynx- clear Lungs- Clear to ausculation bilaterally, normal work of breathing Chest- pacemaker pocket is well healed Heart- Regular rate and rhythm, no murmurs, rubs or gallops, PMI not laterally displaced GI- soft, NT, ND, + BS Extremities- no clubbing, cyanosis, +2 stable edema  Pacemaker interrogation- reviewed in detail today,  See PACEART report  Assessment and Plan:  Mobitz (type) II atrioventricular block  Normal pacemaker function  See Pace Art report  No changes today  V pacing 19%  Atrial fibrillation  Episodes reviewed appeared to be oversensing on the atrial channel. I have decreased sensitivity to 1.79mV today. No changes  Hypertension   Stable  No change required today   Dyspnea   stable  Merlin checks every 3 months Return in 1 year

## 2012-03-06 NOTE — Patient Instructions (Addendum)
Your physician wants you to follow-up in: 12 months with Dr Vallery Ridge will receive a reminder letter in the mail two months in advance. If you don't receive a letter, please call our office to schedule the follow-up appointment.  Remote monitoring is used to monitor your Pacemaker of ICD from home. This monitoring reduces the number of office visits required to check your device to one time per year. It allows [place] to keep an eye on the functioning of your device to ensure it is working properly. You are scheduled for a device check from home on 06/13/2011. You may send your transmission at any time that day. If you have a wireless device, the transmission will be sent automatically. After your physician reviews your transmission, you will receive a postcard with your next transmission date.

## 2012-03-15 ENCOUNTER — Other Ambulatory Visit (HOSPITAL_COMMUNITY): Payer: Self-pay | Admitting: *Deleted

## 2012-03-17 ENCOUNTER — Encounter (HOSPITAL_COMMUNITY): Payer: Self-pay

## 2012-03-17 ENCOUNTER — Encounter (HOSPITAL_COMMUNITY)
Admission: RE | Admit: 2012-03-17 | Discharge: 2012-03-17 | Disposition: A | Discharge status: Home / Self Care | Payer: 59 | Source: Ambulatory Visit | Attending: Nephrology | Admitting: Nephrology

## 2012-03-17 ENCOUNTER — Encounter (HOSPITAL_BASED_OUTPATIENT_CLINIC_OR_DEPARTMENT_OTHER): Payer: Self-pay | Admitting: *Deleted

## 2012-03-17 DIAGNOSIS — E756 Lipid storage disorder, unspecified: Secondary | ICD-10-CM | POA: Insufficient documentation

## 2012-03-17 LAB — FERRITIN: Ferritin: 394 ng/mL — ABNORMAL HIGH (ref 22–322)

## 2012-03-17 MED ORDER — SODIUM CHLORIDE 0.9 % IV SOLN
INTRAVENOUS | Status: DC
  Administered 2012-03-17: 10:00:00 via INTRAVENOUS

## 2012-03-17 MED ORDER — SODIUM CHLORIDE 0.9 % IV SOLN
115.0000 mg | INTRAVENOUS | Status: DC
  Administered 2012-03-17: 115 mg via INTRAVENOUS
  Filled 2012-03-17: qty 23

## 2012-03-20 ENCOUNTER — Encounter (HOSPITAL_COMMUNITY): Payer: Self-pay | Admitting: *Deleted

## 2012-03-20 ENCOUNTER — Encounter (HOSPITAL_BASED_OUTPATIENT_CLINIC_OR_DEPARTMENT_OTHER): Payer: Self-pay | Admitting: *Deleted

## 2012-03-20 NOTE — Progress Notes (Addendum)
PT AWARE HIS SURGERY WAS MOVED FROM [facility]  -  TO MAIN OR [facility] AND HE SHOULD ARRIVE TO SHORT STAY BY 7:30 AM - SURGERY IS AT 9:30 AM.  PT INSTRUCTED NPO AFTER MIDNIGHT NIGHT BEFORE HIS SURGERY--EXCEPT SIP WATER AM OF SURGERY TO TAKE HIS ALLOPURINOL AND COLCHICINE--THAT HE IS NOT TO TAKE ANY DIABETIC MEDICATION AM OF HIS SURGERY--OR ANY OTHER MEDICATIONS OTHER THAN ALLOPURINOL AND COLCHICINE.  PT STATES HE HAS INSTRUCTIONS ABOUT SHOWER WITH [facility]. PT'S MEDICAL HISTORY / MEDICATIONS WERE REVIEWED WITH PT TODAY BY DENISE SEXTON RN AT THE [facility]--PT STATES WE "SHOULD HAVE IT ALL".

## 2012-03-20 NOTE — Progress Notes (Addendum)
NPO AFTER MN. ARRIVES AT 0800. NEEDS ISTAT. CURRENT EKG AND CXR IN EPIC AND CHART. CURRENT CARDIAC NOTE, DR Rayann Heman , AND PACER CHECK IN EPIC.  WILL DO HIBICLENS HS BEFORE AND AM OF SURG.  REVIEWED CHART W/ DR Lissa Hoard MDA AND CONSULTED W/ DR CARIGNIN MDA,,  THEY AGREED THE PT SHOULD BE DONE AT MAIN OR DUE TO MEDICAL HISTORY.  LM W/ DR Marcello Moores OR SCHEDULER TO CANCEL CASE HERE AND RESCHEDULE FOR MAIN OR.

## 2012-03-21 NOTE — Progress Notes (Signed)
ST JUDE REP BRIAN SMALL NOTIFIED OF PT'S SURGERY SCHEDULED AT [name] TOMORROW AT 9:30 AM AND OF ORDER FROM DR. JRayann Heman THAT "ASYNCHRONOUS PACING DURING PROCEDURE AND RETURNED TO NORMAL PROGRAMMING AFTER PROCEDURE".  BRIAN STATES MAGNET OK TO USE AND CALL HIM WHEN PT ARRIVES TO PACU.  I HAVE PLACED THIS NOTE ON FRONT OF PATIENT'S CHART.

## 2012-03-22 ENCOUNTER — Encounter (HOSPITAL_COMMUNITY): Payer: Self-pay | Admitting: *Deleted

## 2012-03-22 ENCOUNTER — Encounter (HOSPITAL_COMMUNITY)
Admission: RE | Disposition: A | Discharge status: Home / Self Care | Payer: Self-pay | Source: Ambulatory Visit | Attending: General Surgery

## 2012-03-22 ENCOUNTER — Ambulatory Visit (HOSPITAL_COMMUNITY)
Admission: RE | Admit: 2012-03-22 | Discharge: 2012-03-22 | Disposition: A | Discharge status: Home / Self Care | Payer: 59 | Source: Ambulatory Visit | Attending: General Surgery | Admitting: General Surgery

## 2012-03-22 ENCOUNTER — Ambulatory Visit (HOSPITAL_COMMUNITY): Payer: 59 | Admitting: *Deleted

## 2012-03-22 ENCOUNTER — Ambulatory Visit (HOSPITAL_BASED_OUTPATIENT_CLINIC_OR_DEPARTMENT_OTHER): Admission: RE | Admit: 2012-03-22 | Payer: 59 | Source: Ambulatory Visit | Admitting: General Surgery

## 2012-03-22 ENCOUNTER — Encounter (HOSPITAL_BASED_OUTPATIENT_CLINIC_OR_DEPARTMENT_OTHER): Admission: RE | Payer: Self-pay | Source: Ambulatory Visit

## 2012-03-22 DIAGNOSIS — K429 Umbilical hernia without obstruction or gangrene: Secondary | ICD-10-CM

## 2012-03-22 DIAGNOSIS — Z79899 Other long term (current) drug therapy: Secondary | ICD-10-CM | POA: Insufficient documentation

## 2012-03-22 DIAGNOSIS — I509 Heart failure, unspecified: Secondary | ICD-10-CM | POA: Insufficient documentation

## 2012-03-22 DIAGNOSIS — J45909 Unspecified asthma, uncomplicated: Secondary | ICD-10-CM | POA: Insufficient documentation

## 2012-03-22 DIAGNOSIS — E119 Type 2 diabetes mellitus without complications: Secondary | ICD-10-CM | POA: Insufficient documentation

## 2012-03-22 DIAGNOSIS — I10 Essential (primary) hypertension: Secondary | ICD-10-CM | POA: Insufficient documentation

## 2012-03-22 DIAGNOSIS — Z950 Presence of cardiac pacemaker: Secondary | ICD-10-CM | POA: Insufficient documentation

## 2012-03-22 HISTORY — DX: Shortness of breath: R06.02

## 2012-03-22 HISTORY — DX: Other asthma: J45.998

## 2012-03-22 HISTORY — DX: Other hypertrophic cardiomyopathy: I42.2

## 2012-03-22 HISTORY — DX: Personal history of diseases of the skin and subcutaneous tissue: Z87.2

## 2012-03-22 HISTORY — DX: Lymphedema, not elsewhere classified: I89.0

## 2012-03-22 HISTORY — PX: UMBILICAL HERNIA REPAIR: SHX196

## 2012-03-22 HISTORY — DX: Unspecified diastolic (congestive) heart failure: I50.30

## 2012-03-22 LAB — GLUCOSE, CAPILLARY
Glucose-Capillary: 127 mg/dL — ABNORMAL HIGH (ref 70–99)
Glucose-Capillary: 96 mg/dL (ref 70–99)

## 2012-03-22 LAB — SURGICAL PCR SCREEN
MRSA, PCR: NEGATIVE
Staphylococcus aureus: NEGATIVE

## 2012-03-22 SURGERY — REPAIR, HERNIA, UMBILICAL, ADULT
Anesthesia: General

## 2012-03-22 SURGERY — REPAIR, HERNIA, UMBILICAL, ADULT
Anesthesia: General | Wound class: Clean

## 2012-03-22 MED ORDER — CEFAZOLIN SODIUM-DEXTROSE 2-3 GM-% IV SOLR
INTRAVENOUS | Status: AC
  Filled 2012-03-22: qty 50

## 2012-03-22 MED ORDER — PROMETHAZINE HCL 25 MG/ML IJ SOLN: 6.2500 mg | INTRAMUSCULAR | Status: DC | PRN

## 2012-03-22 MED ORDER — FENTANYL CITRATE 0.05 MG/ML IJ SOLN: 25.0000 ug | INTRAMUSCULAR | Status: DC | PRN

## 2012-03-22 MED ORDER — MEPERIDINE HCL 50 MG/ML IJ SOLN: 6.2500 mg | INTRAMUSCULAR | Status: DC | PRN

## 2012-03-22 MED ORDER — OXYCODONE HCL 5 MG PO TABS: 5.0000 mg | ORAL_TABLET | ORAL | Status: DC | PRN

## 2012-03-22 MED ORDER — BUPIVACAINE-EPINEPHRINE (PF) 0.5% -1:200000 IJ SOLN
INTRAMUSCULAR | Status: AC
  Filled 2012-03-22: qty 10

## 2012-03-22 MED ORDER — 0.9 % SODIUM CHLORIDE (POUR BTL) OPTIME
TOPICAL | Status: DC | PRN
  Administered 2012-03-22: 1000 mL

## 2012-03-22 MED ORDER — PROPOFOL 10 MG/ML IV EMUL
INTRAVENOUS | Status: DC | PRN
  Administered 2012-03-22: 180 mg via INTRAVENOUS

## 2012-03-22 MED ORDER — FENTANYL CITRATE 0.05 MG/ML IJ SOLN
INTRAMUSCULAR | Status: DC | PRN
  Administered 2012-03-22: 25 ug via INTRAVENOUS

## 2012-03-22 MED ORDER — ACETAMINOPHEN 10 MG/ML IV SOLN
INTRAVENOUS | Status: AC
  Filled 2012-03-22: qty 100

## 2012-03-22 MED ORDER — CEFAZOLIN SODIUM-DEXTROSE 2-3 GM-% IV SOLR
2.0000 g | INTRAVENOUS | Status: AC
  Administered 2012-03-22: 2 g via INTRAVENOUS

## 2012-03-22 MED ORDER — BUPIVACAINE-EPINEPHRINE 0.5% -1:200000 IJ SOLN
INTRAMUSCULAR | Status: DC | PRN
  Administered 2012-03-22: 17 mL

## 2012-03-22 MED ORDER — ONDANSETRON HCL 4 MG/2ML IJ SOLN
INTRAMUSCULAR | Status: DC | PRN
  Administered 2012-03-22 (×2): 2 mg via INTRAVENOUS

## 2012-03-22 MED ORDER — LACTATED RINGERS IV SOLN
INTRAVENOUS | Status: DC | PRN
  Administered 2012-03-22 (×2): via INTRAVENOUS

## 2012-03-22 MED ORDER — EPHEDRINE SULFATE 50 MG/ML IJ SOLN
INTRAMUSCULAR | Status: DC | PRN
  Administered 2012-03-22 (×3): 10 mg via INTRAVENOUS

## 2012-03-22 MED ORDER — MUPIROCIN 2 % EX OINT
TOPICAL_OINTMENT | Freq: Two times a day (BID) | CUTANEOUS | Status: DC
  Administered 2012-03-22: 08:00:00 via NASAL
  Filled 2012-03-22: qty 22

## 2012-03-22 MED ORDER — LACTATED RINGERS IV SOLN: INTRAVENOUS | Status: DC

## 2012-03-22 MED ORDER — ACETAMINOPHEN 10 MG/ML IV SOLN
INTRAVENOUS | Status: DC | PRN
  Administered 2012-03-22: 1000 mg via INTRAVENOUS

## 2012-03-22 MED ORDER — MIDAZOLAM HCL 5 MG/5ML IJ SOLN
INTRAMUSCULAR | Status: DC | PRN
  Administered 2012-03-22: .5 mg via INTRAVENOUS

## 2012-03-22 MED ORDER — SODIUM CHLORIDE 0.9 % IV SOLN
INTRAVENOUS | Status: DC | PRN
  Administered 2012-03-22: 11:00:00 via INTRAVENOUS

## 2012-03-22 MED ORDER — LIDOCAINE HCL (CARDIAC) 20 MG/ML IV SOLN
INTRAVENOUS | Status: DC | PRN
  Administered 2012-03-22: 50 mg via INTRAVENOUS

## 2012-03-22 SURGICAL SUPPLY — 44 items
BENZOIN TINCTURE PRP APPL 2/3 (GAUZE/BANDAGES/DRESSINGS) IMPLANT
BLADE HEX COATED 2.75 (ELECTRODE) ×2 IMPLANT
BLADE SURG 15 STRL LF DISP TIS (BLADE) IMPLANT
BLADE SURG 15 STRL SS (BLADE)
BLADE SURG SZ10 CARB STEEL (BLADE) ×2 IMPLANT
CANISTER SUCTION 2500CC (MISCELLANEOUS) ×2 IMPLANT
CHLORAPREP W/TINT 26ML (MISCELLANEOUS) ×2 IMPLANT
CLOTH BEACON ORANGE TIMEOUT ST (SAFETY) ×2 IMPLANT
DECANTER SPIKE VIAL GLASS SM (MISCELLANEOUS) ×2 IMPLANT
DERMABOND ADVANCED (GAUZE/BANDAGES/DRESSINGS) ×1
DERMABOND ADVANCED .7 DNX12 (GAUZE/BANDAGES/DRESSINGS) ×1 IMPLANT
DRAIN PENROSE 18X1/2 LTX STRL (DRAIN) IMPLANT
DRAPE LAPAROSCOPIC ABDOMINAL (DRAPES) ×2 IMPLANT
DRAPE UTILITY XL STRL (DRAPES) ×2 IMPLANT
ELECT REM PT RETURN 9FT ADLT (ELECTROSURGICAL) ×2
ELECTRODE REM PT RTRN 9FT ADLT (ELECTROSURGICAL) ×1 IMPLANT
GLOVE BIO SURGEON STRL SZ7 (GLOVE) ×2 IMPLANT
GLOVE BIO SURGEON STRL SZ7.5 (GLOVE) ×2 IMPLANT
GLOVE BIOGEL M STRL SZ7.5 (GLOVE) ×2 IMPLANT
GLOVE BIOGEL PI IND STRL 7.0 (GLOVE) ×1 IMPLANT
GLOVE BIOGEL PI INDICATOR 7.0 (GLOVE) ×1
GLOVE INDICATOR 8.0 STRL GRN (GLOVE) IMPLANT
GOWN STRL NON-REIN LRG LVL3 (GOWN DISPOSABLE) IMPLANT
GOWN STRL REIN XL XLG (GOWN DISPOSABLE) ×6 IMPLANT
KIT BASIN OR (CUSTOM PROCEDURE TRAY) ×2 IMPLANT
NEEDLE HYPO 25X1 1.5 SAFETY (NEEDLE) ×2 IMPLANT
NS IRRIG 1000ML POUR BTL (IV SOLUTION) ×2 IMPLANT
PACK BASIC VI WITH GOWN DISP (CUSTOM PROCEDURE TRAY) ×2 IMPLANT
PENCIL BUTTON HOLSTER BLD 10FT (ELECTRODE) ×2 IMPLANT
SPONGE GAUZE 4X4 12PLY (GAUZE/BANDAGES/DRESSINGS) IMPLANT
SPONGE LAP 18X18 X RAY DECT (DISPOSABLE) ×2 IMPLANT
STRIP CLOSURE SKIN 1/2X4 (GAUZE/BANDAGES/DRESSINGS) IMPLANT
SUT MNCRL AB 4-0 PS2 18 (SUTURE) IMPLANT
SUT PROLENE 2 0 SH DA (SUTURE) ×4 IMPLANT
SUT SILK 2 0 SH (SUTURE) IMPLANT
SUT VIC AB 2-0 SH 27 (SUTURE) ×1
SUT VIC AB 2-0 SH 27X BRD (SUTURE) ×1 IMPLANT
SUT VIC AB 3-0 SH 18 (SUTURE) IMPLANT
SUT VIC AB 3-0 SH 27 (SUTURE)
SUT VIC AB 3-0 SH 27XBRD (SUTURE) IMPLANT
SYR BULB IRRIGATION 50ML (SYRINGE) ×2 IMPLANT
SYR CONTROL 10ML LL (SYRINGE) ×2 IMPLANT
TOWEL OR 17X26 10 PK STRL BLUE (TOWEL DISPOSABLE) ×2 IMPLANT
YANKAUER SUCT BULB TIP 10FT TU (MISCELLANEOUS) ×2 IMPLANT

## 2012-03-22 NOTE — Anesthesia Preprocedure Evaluation (Addendum)
Anesthesia Evaluation  Patient identified by MRN, date of birth, ID band Patient awake    Reviewed: Allergy & Precautions, H&P , NPO status , Patient's Chart, lab work & pertinent test results  Airway Mallampati: I TM Distance: >3 FB Neck ROM: Full    Dental No notable dental hx.    Pulmonary neg pulmonary ROS, shortness of breath and with exertion, asthma ,  breath sounds clear to auscultation  Pulmonary exam normal       Cardiovascular hypertension, Pt. on medications +CHF (hypertropic cardiomyopathy) negative cardio ROS  + dysrhythmias (Mobitz type II resulting in PPM) + pacemaker Rhythm:Regular Rate:Normal  Fabry's disease   Neuro/Psych negative neurological ROS  negative psych ROS   GI/Hepatic negative GI ROS, Neg liver ROS,   Endo/Other  negative endocrine ROSdiabetes, Type 2, Oral Hypoglycemic Agents  Renal/GU Renal InsufficiencyRenal diseasenegative Renal ROS  negative genitourinary   Musculoskeletal negative musculoskeletal ROS (+)   Abdominal   Peds negative pediatric ROS (+)  Hematology negative hematology ROS (+)   Anesthesia Other Findings   Reproductive/Obstetrics negative OB ROS                        Anesthesia Physical Anesthesia Plan  ASA: III  Anesthesia Plan: General   Post-op Pain Management:    Induction: Intravenous  Airway Management Planned: LMA  Additional Equipment:   Intra-op Plan:   Post-operative Plan: Extubation in OR  Informed Consent: I have reviewed the patients History and Physical, chart, labs and discussed the procedure including the risks, benefits and alternatives for the proposed anesthesia with the patient or authorized representative who has indicated his/her understanding and acceptance.   Dental advisory given  Plan Discussed with: CRNA  Anesthesia Plan Comments:         Anesthesia Quick Evaluation

## 2012-03-22 NOTE — Preoperative (Signed)
Beta Blockers   Reason not to administer Beta Blockers:Not Applicable 

## 2012-03-22 NOTE — Op Note (Signed)
03/22/2012  11:01 AM  PATIENT:  [name]  51 y.o. male  Patient Care Team: Elizabeth Palau as PCP - General (General Surgery) Sinclair Grooms, MD as Consulting Physician (Cardiology)  PRE-OPERATIVE DIAGNOSIS:  umbilical hernia  POST-OPERATIVE DIAGNOSIS:  umbilical hernia  PROCEDURE:  Procedure(s): HERNIA REPAIR UMBILICAL ADULT  SURGEON:  Surgeon(s): Leighton Ruff, MD Haywood Lasso, MD  ASSISTANT: Dr Margot Chimes    ANESTHESIA:   local and LMA  EBL: min  Total I/O In: 950 [I.V.:950] Out: -    COUNTS:  YES  PLAN OF CARE: Discharge to home after PACU  PATIENT DISPOSITION:  PACU - hemodynamically stable.  INDICATION: this is a 51 year old who presented to my office with an umbilical hernia. The risks and benefits of an umbilical hernia repair were explained to the patient. This included bleeding, infection and the risk of recurrence.  After all of this was discussed consent was signed and placed on chart. All questions were answered.  OR FINDINGS: The patient had a ~1.5 cm hernia with no definable hernia sac  DESCRIPTION: the patient was then taken to the OR and laid supine on the operating room table. General anesthesia was smoothly induced.  The patient's abdomen was prepped and draped in usual sterile fashion. A surgical timeout was performed indicating the correct patient, procedure, positioning and preoperative antibiotics. [place] serosa had been placed prior to the initiation of anesthesia. After this was completed an infraumbilical incision was made using a 15 blade scalpel. This is carried down to the level of the hernia. The hernia edges were identified and the scar tissue was dissected free from the hernia edges using electrocautery and blunt dissection. Once the eschar tissue was freed and the hernia edges were freely mobile, and interrupted 0 Prolene sutures were used to reapproximate the fascia edges.  The umbilical skin was closed down to the fascia using a  2-0 Vicryl suture.  The dermis was reapproximated using a running 4-0 Monocryl suture. The skin was reapproximated with Dermabond. The patient was then awakened from anesthesia and sent to the postanesthesia care unit in stable condition. All counts are correct the operative staff.

## 2012-03-22 NOTE — Transfer of Care (Signed)
Immediate Anesthesia Transfer of Care Note  [name]  Procedure(s) Performed: Procedure(s) (LRB) with comments: HERNIA REPAIR UMBILICAL ADULT (N/A) - Umbilical Hernia Repair   Patient Location: PACU  Anesthesia Type: General  Level of Consciousness: awake, alert , oriented and patient cooperative  Airway & Oxygen Therapy: Patient Spontanous Breathing and Patient connected to face mask oxygen  Post-op Assessment: Report given to PACU RN, Post -op Vital signs reviewed and stable and Patient moving all extremities  Post vital signs: Reviewed and stable  Complications: No apparent anesthesia complications

## 2012-03-22 NOTE — H&P (View-Only) (Signed)
Chief Complaint  Patient presents with  . Umbilical Hernia    HISTORY:  [name] is a 51 y.o. male who presents to clinic with an umbilical mass.  He denies any pain.  His PCP would like it evaluated.  This has been occurring for months to years.    Past Medical History  Diagnosis Date  . Second degree Mobitz II AV block     with syncope, s/p PPM  . Fabry's disease   . Lymph edema     lower legs - has wrapped at wound center 3x/week   . Hypertension   . Obesity   . Bifascicular block   . Fabry's disease   . Diabetes mellitus   . Arthritis   . Gout, arthritis 11/2011    bil feet. right worse  . Shortness of breath     short of breath on exertion  . Pacemaker     02/12/11  . Asthma      Past Surgical History  Procedure Date  . Cardiac catheterization   . Pacemaker insertion 02/12/11    SJM implanted by Dr Rayann Heman for Mobitz II second degree AV block and syncope  . Insert / replace / remove pacemaker     02/12/11      Current Outpatient Prescriptions  Medication Sig Dispense Refill  . acetaminophen (TYLENOL) 500 MG tablet Take 500 mg by mouth once. AS premed for Fabrazyme      . Agalsidase beta (FABRAZYME IV) Inject into the vein every 14 (fourteen) days.      [name] Kitchen allopurinol (ZYLOPRIM) 100 MG tablet Take 100 mg by mouth daily.      [name] Kitchen aspirin 81 MG tablet Take 81 mg by mouth daily.        . Blood Glucose Monitoring Suppl (BLOOD GLUCOSE METER) kit Use as instructed  1 each  0  . colchicine (COLCRYS) 0.6 MG tablet Take 0.6 mg by mouth daily.      . fexofenadine (ALLEGRA) 60 MG tablet Take 60 mg by mouth once.      . glyBURIDE (DIABETA) 5 MG tablet Take 5 mg by mouth daily with breakfast.      . insulin aspart (NOVOLOG) 100 UNIT/ML injection Inject 8 Units into the skin 3 (three) times daily with meals.  1 vial  5  . insulin glargine (LANTUS) 100 UNIT/ML injection Inject 60 Units into the skin at bedtime.  10 mL  12  . Insulin Syringes, Disposable, U-100 1 ML MISC 1 Syringe  by Does not apply route QID.  100 each  5  . losartan (COZAAR) 25 MG tablet Take 25 mg by mouth 2 (two) times daily.      [name] Kitchen spironolactone (ALDACTONE) 25 MG tablet Take 25 mg by mouth 2 (two) times daily.      [name] Kitchen DISCONTD: furosemide (LASIX) 20 MG tablet Take 80 mg by mouth daily.          Allergies  Allergen Reactions  . Shellfish Allergy       FH: noncontributory  History   Social History  . Marital Status: Single    Spouse Name: N/A    Number of Children: N/A  . Years of Education: N/A   Social History Main Topics  . Smoking status: Never Smoker   . Smokeless tobacco: Never Used  . Alcohol Use: Yes  . Drug Use: No  . Sexually Active: None   Other Topics Concern  . None   Social History Narrative  .  None     REVIEW OF SYSTEMS - PERTINENT POSITIVES ONLY: Review of Systems - General ROS: negative for - chills, fever, weight gain or weight loss Hematological and Lymphatic ROS: negative for - bleeding problems, blood clots or bruising Respiratory ROS: positive for - wheezing negative for - cough Cardiovascular ROS: positive for - edema and shortness of breath negative for - chest pain Gastrointestinal ROS: no abdominal pain, change in bowel habits, or black or bloody stools Genito-Urinary ROS: no dysuria, trouble voiding, or hematuria Musculoskeletal ROS: positive for - joint swelling  EXAM: Filed Vitals:   02/29/12 0950  BP: 128/82  Pulse: 72  Temp: 98 F (36.7 C)  Resp: 18    General appearance: alert, cooperative and no distress Resp: clear to auscultation bilaterally Cardio: regular rate and rhythm GI: soft, non-tender; bowel sounds normal; no masses,  no organomegaly [id number] umbilical hernia   ASSESSMENT AND PLAN: [name] is a 51 y.o. male with an umbilical hernia.  The risks and benefits of surgical repair vs watching and waiting were explained to the patient.  He has elected to proceed with surgery.  We will schedule this at his convenience  once cardiac evaluation is obtained.    Rosario Adie, MD Colon and Rectal Surgery / [facility]      Visit Diagnoses: 1. Umbilical hernia     Primary Care Physician: Elizabeth Palau, MD

## 2012-03-22 NOTE — Interval H&P Note (Signed)
History and Physical Interval Note:  03/22/2012 9:42 AM  [name]  has presented today for surgery, with the diagnosis of umbilical hernia  The various methods of treatment have been discussed with the patient and family. After consideration of risks, benefits and other options for treatment, the patient has consented to  Procedure(s) (LRB) with comments: HERNIA REPAIR UMBILICAL ADULT (N/A) - Umbilical Hernia Repair  INSERTION OF MESH (N/A) - possible Mesh as a surgical intervention .  The patient's history has been reviewed, patient examined, no change in status, stable for surgery.  I have reviewed the patient's chart and labs.  Questions were answered to the patient's satisfaction.     Rosario Adie, MD  [facility]

## 2012-03-23 ENCOUNTER — Encounter (HOSPITAL_COMMUNITY): Payer: Self-pay | Admitting: General Surgery

## 2012-03-23 NOTE — Anesthesia Postprocedure Evaluation (Signed)
  Anesthesia Post-op Note  [name]  Procedure(s) Performed: Procedure(s) (LRB): HERNIA REPAIR UMBILICAL ADULT (N/A)  Patient Location: PACU  Anesthesia Type: General  Level of Consciousness: awake and alert   Airway and Oxygen Therapy: Patient Spontanous Breathing  Post-op Pain: mild  Post-op Assessment: Post-op Vital signs reviewed, Patient's Cardiovascular Status Stable, Respiratory Function Stable, Patent Airway and No signs of Nausea or vomiting  Post-op Vital Signs: stable  Complications: No apparent anesthesia complications

## 2012-03-30 ENCOUNTER — Other Ambulatory Visit (HOSPITAL_COMMUNITY): Payer: Self-pay | Admitting: *Deleted

## 2012-03-31 ENCOUNTER — Encounter (HOSPITAL_COMMUNITY)
Admission: RE | Admit: 2012-03-31 | Discharge: 2012-03-31 | Disposition: A | Discharge status: Home / Self Care | Payer: 59 | Source: Ambulatory Visit | Attending: Nephrology | Admitting: Nephrology

## 2012-03-31 ENCOUNTER — Institutional Professional Consult (permissible substitution): Payer: 59 | Admitting: Internal Medicine

## 2012-03-31 ENCOUNTER — Encounter (HOSPITAL_COMMUNITY): Payer: Self-pay

## 2012-03-31 LAB — COMPREHENSIVE METABOLIC PANEL
ALT: 33 U/L (ref 0–53)
AST: 32 U/L (ref 0–37)
Albumin: 3.4 g/dL — ABNORMAL LOW (ref 3.5–5.2)
Alkaline Phosphatase: 76 U/L (ref 39–117)
BUN: 19 mg/dL (ref 6–23)
CO2: 22 mEq/L (ref 19–32)
Calcium: 9.1 mg/dL (ref 8.4–10.5)
Chloride: 104 mEq/L (ref 96–112)
Creatinine, Ser: 1.2 mg/dL (ref 0.50–1.35)
GFR calc Af Amer: 79 mL/min — ABNORMAL LOW (ref >90)
GFR calc non Af Amer: 68 mL/min — ABNORMAL LOW (ref >90)
Glucose, Bld: 79 mg/dL (ref 70–99)
Potassium: 4 mEq/L (ref 3.5–5.1)
Sodium: 136 mEq/L (ref 135–145)
Total Bilirubin: 0.2 mg/dL — ABNORMAL LOW (ref 0.3–1.2)
Total Protein: 8 g/dL (ref 6.0–8.3)

## 2012-03-31 LAB — URINALYSIS, ROUTINE W REFLEX MICROSCOPIC
Bilirubin Urine: NEGATIVE
Glucose, UA: NEGATIVE mg/dL
Hgb urine dipstick: NEGATIVE
Ketones, ur: NEGATIVE mg/dL
Leukocytes, UA: NEGATIVE
Nitrite: NEGATIVE
Protein, ur: NEGATIVE mg/dL
Specific Gravity, Urine: 1.012 (ref 1.005–1.030)
Urobilinogen, UA: 0.2 mg/dL (ref 0.0–1.0)
pH: 5.5 (ref 5.0–8.0)

## 2012-03-31 LAB — PHOSPHORUS: Phosphorus: 3.7 mg/dL (ref 2.3–4.6)

## 2012-03-31 MED ORDER — SODIUM CHLORIDE 0.9 % IV SOLN
115.0000 mg | INTRAVENOUS | Status: AC
  Administered 2012-03-31: 115 mg via INTRAVENOUS
  Filled 2012-03-31: qty 23

## 2012-03-31 MED ORDER — SODIUM CHLORIDE 0.9 % IV SOLN
INTRAVENOUS | Status: AC
  Administered 2012-03-31: 09:00:00 via INTRAVENOUS

## 2012-04-11 ENCOUNTER — Ambulatory Visit (INDEPENDENT_AMBULATORY_CARE_PROVIDER_SITE_OTHER): Payer: 59 | Admitting: General Surgery

## 2012-04-11 ENCOUNTER — Encounter (INDEPENDENT_AMBULATORY_CARE_PROVIDER_SITE_OTHER): Payer: Self-pay | Admitting: General Surgery

## 2012-04-11 VITALS — BP 138/86 | HR 76 | Temp 97.5°F | Resp 16 | Ht 69.0 in | Wt 261.8 lb

## 2012-04-11 DIAGNOSIS — Z09 Encounter for follow-up examination after completed treatment for conditions other than malignant neoplasm: Secondary | ICD-10-CM

## 2012-04-11 NOTE — Progress Notes (Signed)
TONEY TOW is a 51 y.o. male who is status post a open umbilical hernia repair on 10/16.  He is doing well.  His pain is minimal.  He is tolerating a diet and not constipated.  Objective: Filed Vitals:   04/11/12 0918  BP: 138/86  Pulse: 76  Temp: 97.5 F (36.4 C)  Resp: 16    General appearance: alert, cooperative and no distress GI: soft, moderate scar at umbilicus  Incision: healing well   Assessment: s/p  Patient Active Problem List  Diagnosis  . Mobitz (type) II atrioventricular block  . Fabry disease  . Hypertension  . Dyspnea  . Type 2 diabetes mellitus with hyperosmolar nonketotic hyperglycemia  . Acute on chronic renal insufficiency  . Troponin level elevated  . History of cardiac pacemaker  . Pacemaker-[employer]      Plan: Doing well post op.  Continue no heavy lifting for 4 more weeks.  F/U PRN    .Rosario Adie, MD [facility], Elizabeth   04/11/2012 9:32 AM

## 2012-04-11 NOTE — Patient Instructions (Signed)
Follow up as needed   no heavy lifting for a total of at least 6 weeks and ideally 8 weeks if possible

## 2012-04-14 ENCOUNTER — Encounter (HOSPITAL_COMMUNITY): Payer: Self-pay

## 2012-04-14 ENCOUNTER — Other Ambulatory Visit (HOSPITAL_COMMUNITY): Payer: Self-pay | Admitting: Nephrology

## 2012-04-14 ENCOUNTER — Encounter (HOSPITAL_COMMUNITY)
Admission: RE | Admit: 2012-04-14 | Discharge: 2012-04-14 | Disposition: A | Discharge status: Home / Self Care | Payer: 59 | Source: Ambulatory Visit | Attending: Nephrology | Admitting: Nephrology

## 2012-04-14 DIAGNOSIS — E756 Lipid storage disorder, unspecified: Secondary | ICD-10-CM | POA: Insufficient documentation

## 2012-04-14 MED ORDER — SODIUM CHLORIDE 0.9 % IV SOLN
INTRAVENOUS | Status: DC
  Administered 2012-04-14: 250 mL via INTRAVENOUS

## 2012-04-14 MED ORDER — SODIUM CHLORIDE 0.9 % IV SOLN
115.0000 mg | INTRAVENOUS | Status: DC
  Administered 2012-04-14: 115 mg via INTRAVENOUS
  Filled 2012-04-14: qty 23

## 2012-04-24 ENCOUNTER — Institutional Professional Consult (permissible substitution): Payer: 59 | Admitting: Internal Medicine

## 2012-04-28 ENCOUNTER — Encounter (HOSPITAL_COMMUNITY)
Admission: RE | Admit: 2012-04-28 | Discharge: 2012-04-28 | Disposition: A | Discharge status: Home / Self Care | Payer: 59 | Source: Ambulatory Visit | Attending: Nephrology | Admitting: Nephrology

## 2012-04-28 ENCOUNTER — Encounter (HOSPITAL_COMMUNITY): Payer: Self-pay

## 2012-04-28 LAB — COMPREHENSIVE METABOLIC PANEL
ALT: 29 U/L (ref 0–53)
AST: 28 U/L (ref 0–37)
Albumin: 3.2 g/dL — ABNORMAL LOW (ref 3.5–5.2)
Alkaline Phosphatase: 69 U/L (ref 39–117)
BUN: 20 mg/dL (ref 6–23)
CO2: 26 mEq/L (ref 19–32)
Calcium: 8.9 mg/dL (ref 8.4–10.5)
Chloride: 105 mEq/L (ref 96–112)
Creatinine, Ser: 1.48 mg/dL — ABNORMAL HIGH (ref 0.50–1.35)
GFR calc Af Amer: 62 mL/min — ABNORMAL LOW (ref >90)
GFR calc non Af Amer: 53 mL/min — ABNORMAL LOW (ref >90)
Glucose, Bld: 164 mg/dL — ABNORMAL HIGH (ref 70–99)
Potassium: 4.3 mEq/L (ref 3.5–5.1)
Sodium: 140 mEq/L (ref 135–145)
Total Bilirubin: 0.3 mg/dL (ref 0.3–1.2)
Total Protein: 7.4 g/dL (ref 6.0–8.3)

## 2012-04-28 LAB — URINE MICROSCOPIC-ADD ON

## 2012-04-28 LAB — URINALYSIS, ROUTINE W REFLEX MICROSCOPIC
Bilirubin Urine: NEGATIVE
Glucose, UA: NEGATIVE mg/dL
Hgb urine dipstick: NEGATIVE
Ketones, ur: NEGATIVE mg/dL
Leukocytes, UA: NEGATIVE
Nitrite: NEGATIVE
Protein, ur: 30 mg/dL — AB
Specific Gravity, Urine: 1.013 (ref 1.005–1.030)
Urobilinogen, UA: 0.2 mg/dL (ref 0.0–1.0)
pH: 5.5 (ref 5.0–8.0)

## 2012-04-28 LAB — PHOSPHORUS: Phosphorus: 3.8 mg/dL (ref 2.3–4.6)

## 2012-04-28 MED ORDER — SODIUM CHLORIDE 0.9 % IV SOLN
INTRAVENOUS | Status: DC
  Administered 2012-04-28: 09:00:00 via INTRAVENOUS

## 2012-04-28 MED ORDER — AGALSIDASE BETA 5 MG IV SOLR
115.0000 mg | INTRAVENOUS | Status: DC
  Administered 2012-04-28: 115 mg via INTRAVENOUS
  Filled 2012-04-28: qty 23

## 2012-05-12 ENCOUNTER — Encounter (HOSPITAL_COMMUNITY)
Admission: RE | Admit: 2012-05-12 | Discharge: 2012-05-12 | Disposition: A | Discharge status: Home / Self Care | Payer: 59 | Source: Ambulatory Visit | Attending: Nephrology | Admitting: Nephrology

## 2012-05-12 ENCOUNTER — Encounter (HOSPITAL_COMMUNITY): Payer: Self-pay

## 2012-05-12 DIAGNOSIS — E756 Lipid storage disorder, unspecified: Secondary | ICD-10-CM | POA: Insufficient documentation

## 2012-05-12 MED ORDER — SODIUM CHLORIDE 0.9 % IV SOLN
115.0000 mg | INTRAVENOUS | Status: DC
  Administered 2012-05-12: 115 mg via INTRAVENOUS
  Filled 2012-05-12: qty 23

## 2012-05-12 MED ORDER — SODIUM CHLORIDE 0.9 % IV SOLN
INTRAVENOUS | Status: DC
  Administered 2012-05-12: 10:00:00 via INTRAVENOUS

## 2012-05-26 ENCOUNTER — Encounter (HOSPITAL_COMMUNITY): Payer: Self-pay

## 2012-05-26 ENCOUNTER — Encounter (HOSPITAL_COMMUNITY)
Admission: RE | Admit: 2012-05-26 | Discharge: 2012-05-26 | Disposition: A | Discharge status: Home / Self Care | Payer: 59 | Source: Ambulatory Visit | Attending: Nephrology | Admitting: Nephrology

## 2012-05-26 LAB — URINALYSIS, ROUTINE W REFLEX MICROSCOPIC
Bilirubin Urine: NEGATIVE
Glucose, UA: NEGATIVE mg/dL
Hgb urine dipstick: NEGATIVE
Ketones, ur: NEGATIVE mg/dL
Leukocytes, UA: NEGATIVE
Nitrite: NEGATIVE
Protein, ur: 30 mg/dL — AB
Specific Gravity, Urine: 1.015 (ref 1.005–1.030)
Urobilinogen, UA: 0.2 mg/dL (ref 0.0–1.0)
pH: 5.5 (ref 5.0–8.0)

## 2012-05-26 LAB — COMPREHENSIVE METABOLIC PANEL
ALT: 32 U/L (ref 0–53)
AST: 30 U/L (ref 0–37)
Albumin: 3.3 g/dL — ABNORMAL LOW (ref 3.5–5.2)
Alkaline Phosphatase: 67 U/L (ref 39–117)
BUN: 19 mg/dL (ref 6–23)
CO2: 23 mEq/L (ref 19–32)
Calcium: 9.4 mg/dL (ref 8.4–10.5)
Chloride: 104 mEq/L (ref 96–112)
Creatinine, Ser: 1.36 mg/dL — ABNORMAL HIGH (ref 0.50–1.35)
GFR calc Af Amer: 68 mL/min — ABNORMAL LOW (ref >90)
GFR calc non Af Amer: 59 mL/min — ABNORMAL LOW (ref >90)
Glucose, Bld: 158 mg/dL — ABNORMAL HIGH (ref 70–99)
Potassium: 4.4 mEq/L (ref 3.5–5.1)
Sodium: 138 mEq/L (ref 135–145)
Total Bilirubin: 0.2 mg/dL — ABNORMAL LOW (ref 0.3–1.2)
Total Protein: 7.7 g/dL (ref 6.0–8.3)

## 2012-05-26 LAB — URINE MICROSCOPIC-ADD ON

## 2012-05-26 LAB — PHOSPHORUS: Phosphorus: 3.9 mg/dL (ref 2.3–4.6)

## 2012-05-26 MED ORDER — SODIUM CHLORIDE 0.9 % IV SOLN
115.0000 mg | INTRAVENOUS | Status: DC
  Administered 2012-05-26: 115 mg via INTRAVENOUS
  Filled 2012-05-26: qty 23

## 2012-05-26 MED ORDER — SODIUM CHLORIDE 0.9 % IV SOLN
INTRAVENOUS | Status: DC
  Administered 2012-05-26: 10:00:00 via INTRAVENOUS

## 2012-06-07 DIAGNOSIS — M109 Gout, unspecified: Secondary | ICD-10-CM

## 2012-06-07 HISTORY — DX: Gout, unspecified: M10.9

## 2012-06-09 ENCOUNTER — Encounter (HOSPITAL_COMMUNITY): Payer: Self-pay

## 2012-06-09 ENCOUNTER — Ambulatory Visit (HOSPITAL_COMMUNITY)
Admission: RE | Admit: 2012-06-09 | Discharge: 2012-06-09 | Disposition: A | Discharge status: Home / Self Care | Payer: 59 | Source: Ambulatory Visit | Attending: Nephrology | Admitting: Nephrology

## 2012-06-09 DIAGNOSIS — E1101 Type 2 diabetes mellitus with hyperosmolarity with coma: Secondary | ICD-10-CM | POA: Insufficient documentation

## 2012-06-09 DIAGNOSIS — R0989 Other specified symptoms and signs involving the circulatory and respiratory systems: Secondary | ICD-10-CM | POA: Insufficient documentation

## 2012-06-09 DIAGNOSIS — R0609 Other forms of dyspnea: Secondary | ICD-10-CM | POA: Insufficient documentation

## 2012-06-09 DIAGNOSIS — N289 Disorder of kidney and ureter, unspecified: Secondary | ICD-10-CM | POA: Insufficient documentation

## 2012-06-09 DIAGNOSIS — I441 Atrioventricular block, second degree: Secondary | ICD-10-CM | POA: Insufficient documentation

## 2012-06-09 DIAGNOSIS — E756 Lipid storage disorder, unspecified: Secondary | ICD-10-CM | POA: Insufficient documentation

## 2012-06-09 DIAGNOSIS — R7989 Other specified abnormal findings of blood chemistry: Secondary | ICD-10-CM | POA: Insufficient documentation

## 2012-06-09 DIAGNOSIS — N189 Chronic kidney disease, unspecified: Secondary | ICD-10-CM | POA: Insufficient documentation

## 2012-06-09 MED ORDER — SODIUM CHLORIDE 0.9 % IV SOLN
INTRAVENOUS | Status: DC
  Administered 2012-06-09: 250 mL via INTRAVENOUS

## 2012-06-09 MED ORDER — SODIUM CHLORIDE 0.9 % IV SOLN
115.0000 mg | INTRAVENOUS | Status: DC
  Administered 2012-06-09: 115 mg via INTRAVENOUS
  Filled 2012-06-09: qty 23

## 2012-06-12 ENCOUNTER — Ambulatory Visit (INDEPENDENT_AMBULATORY_CARE_PROVIDER_SITE_OTHER): Payer: 59 | Admitting: Internal Medicine

## 2012-06-12 ENCOUNTER — Ambulatory Visit (INDEPENDENT_AMBULATORY_CARE_PROVIDER_SITE_OTHER)
Admission: RE | Admit: 2012-06-12 | Discharge: 2012-06-12 | Disposition: A | Discharge status: Home / Self Care | Payer: 59 | Source: Ambulatory Visit | Attending: Internal Medicine | Admitting: Internal Medicine

## 2012-06-12 ENCOUNTER — Ambulatory Visit (INDEPENDENT_AMBULATORY_CARE_PROVIDER_SITE_OTHER): Payer: 59 | Admitting: *Deleted

## 2012-06-12 ENCOUNTER — Encounter: Payer: Self-pay | Admitting: Internal Medicine

## 2012-06-12 VITALS — BP 110/80 | HR 60 | Ht 69.0 in | Wt 274.0 lb

## 2012-06-12 DIAGNOSIS — R0609 Other forms of dyspnea: Secondary | ICD-10-CM

## 2012-06-12 DIAGNOSIS — E7521 Fabry (-Anderson) disease: Secondary | ICD-10-CM

## 2012-06-12 DIAGNOSIS — E756 Lipid storage disorder, unspecified: Secondary | ICD-10-CM

## 2012-06-12 DIAGNOSIS — G4733 Obstructive sleep apnea (adult) (pediatric): Secondary | ICD-10-CM

## 2012-06-12 DIAGNOSIS — I441 Atrioventricular block, second degree: Secondary | ICD-10-CM

## 2012-06-12 DIAGNOSIS — R0600 Dyspnea, unspecified: Secondary | ICD-10-CM

## 2012-06-12 DIAGNOSIS — Z950 Presence of cardiac pacemaker: Secondary | ICD-10-CM

## 2012-06-12 NOTE — Patient Instructions (Addendum)
Order- schedule PFT, 6 MWT   Dx dyspnea with exertion  Order- CXR    Dx Fabry's disease, dyspnea  Order- ONOX  Dx dyspnea

## 2012-06-12 NOTE — Progress Notes (Signed)
06/12/12- 28 yoM never smoker referred courtesy of Dr Moise Boring FOLLOWS FOR: severe wheezing. states that it is worse while he is trying to sleep. Known Fabry Disease X-linked glycolipid storage disease with acute on chronic renal insufficiency( Dr Arty Baumgartner), heart block/pacemaker (Dr Pernell Dupre), hypertension, DM, chronic lymphedema. He is treated with a replacement enzyme infusion twice monthly at [facility]. He has a long history of asthma with chronic bronchitis and abnormal spirometries first noted when he was in the [facility]. Breathing is worse in summer heat and worse in his house than when away. He has been better since moving from [place] to [place] in 2007. Remote allergy skin testing showed little reaction. He never smoked, but grew up in a smoking household. 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 He wheezes during sleep with cough and wheeze being his main complaints. Worse with deep breathing and when lying down. Shortness of breath tends to correspond to worse intervals of peripheral edema. Dyspnea on exertion has been present for years and is worse when he is doing prolonged walking, such as shopping, and on hills or stairs. History of shellfish allergy-joint swell, eyes swell to become slits, and itch. He is also concerned about snoring with suspicion that he has sleep apnea. He notices daytime tiredness but is alone and not aware that he stops breathing. CXR 08/20/11 IMPRESSION:  No acute disease  Original Report Authenticated By: Trecia Rogers, M.D.   //? Need sleep study?//  Prior to Admission medications   Medication Sig Start Date End Date Taking? Authorizing Provider  acetaminophen (TYLENOL) 500 MG tablet Take 500 mg by mouth once. AS premed for Fabrazyme   Yes Historical Provider, MD  Agalsidase beta (FABRAZYME IV) Inject into the vein every 14 (fourteen) days.    Yes Historical Provider, MD  allopurinol (ZYLOPRIM) 100 MG tablet Take 100 mg by mouth daily.   Yes Historical Provider, MD  aspirin 81 MG tablet Take 81 mg by mouth daily.    Yes Historical Provider, MD  colchicine (COLCRYS) 0.6 MG tablet Take 0.6 mg by mouth daily.   Yes Historical Provider, MD  eplerenone (INSPRA) 50 MG tablet Take 50 mg by mouth daily. 05/08/12  Yes Historical Provider, MD  fexofenadine (ALLEGRA) 60 MG tablet Take 60 mg by mouth once.   Yes Historical Provider, MD  glyBURIDE (DIABETA) 5 MG tablet Take 5 mg by mouth daily with breakfast.   Yes Historical Provider, MD  losartan (COZAAR) 25 MG tablet Take 25 mg by mouth daily.     Historical Provider, MD   Past Medical History  Diagnosis Date  . Second degree Mobitz II AV block     with syncope, s/p PPM  . Hypertension   . Bifascicular block   . Diabetes mellitus   . Pacemaker     02/12/11  . Lymphedema of lower extremity LEFT  >  RIGHT    USES PUMP AT HOME-- DOES NOT WEAR HOSE  . Short of breath on exertion   . History of cellulitis of skin with lymphangitis LEFT LEG  . Diastolic heart failure secondary to hypertrophic cardiomyopathy CARDIOLOGIST-- DR Daneen Schick  . Fabry's disease RENAL AND CARDIAC INVOVLEMENT    FOLLOWED DR [place]  . Chronic renal insufficiency   . Gout, arthritis 11/2011    bil feet. right worse  . Seasonal asthma NO INHALERS   Past Surgical History  Procedure Date  . Pacemaker insertion 02/12/11    SJM implanted by Dr Rayann Heman for Mobitz II second degree AV block and syncope  . Cardiac catheterization 03-12-2011  DR Daneen Schick    HYPERTROPHIC CARDIOMYOPATHY WITH LV CAVITY  APPEARANCE CONSISTANT WITH SIGNIFICANT APICAL HYPERTROPHY/ NORMAL LVSF / EF 55%/ EVIDENCE OF DIASTOLIC DYSFUNCTION WITH EDP OF 23-74mmHg AFTER A-WAVE/ NORMAL CORONARY ARTERIES  . Umbilical hernia repair 0000000    Procedure: HERNIA REPAIR UMBILICAL ADULT;  Surgeon: Leighton Ruff, MD;  Location: [facility];  Service: General;   Laterality: N/A;  Umbilical Hernia Repair   . Hernia repair 0000000    Umbilical Hernia Repair   History reviewed. No pertinent family history. History   Social History  . Marital Status: Single    Spouse Name: N/A    Number of Children: N/A  . Years of Education: N/A   Occupational History  . Not on file.   Social History Main Topics  . Smoking status: Never Smoker   . Smokeless tobacco: Never Used  . Alcohol Use: Yes     Comment: OCCASIONAL  . Drug Use: No  . Sexually Active: Not on file   Other Topics Concern  . Not on file   Social History Narrative  . No narrative on file   ROS-see HPI Constitutional:   No-   weight loss, night sweats, fevers, chills, +fatigue, lassitude. HEENT:   No-  headaches, difficulty swallowing, tooth/dental problems, sore throat,       No-  sneezing, itching, ear ache, nasal congestion, post nasal drip,  CV:  No-   chest pain, orthopnea, PND, +swelling in lower extremities, no- anasarca,                                  dizziness, palpitations Resp: +  shortness of breath with exertion or at rest.              No-   productive cough,  + non-productive cough,  No- coughing up of blood.              No-   change in color of mucus.  No- wheezing.   Skin: No-   rash or lesions. GI:  No-   heartburn, indigestion, abdominal pain, nausea, vomiting, diarrhea,                 change in bowel habits, loss of appetite GU: No-   dysuria, change in color of urine, no urgency or frequency.  No- flank pain. MS:  No-   joint pain or swelling.  No- decreased range of motion.  No- back pain. Neuro-     nothing unusual Psych:  No- change in mood or affect. No depression or anxiety.  No memory loss.  OBJ- Physical Exam General- Alert, Oriented, Affect-appropriate, Distress- none acute Skin- rash-none, lesions- none, excoriation- none Lymphadenopathy- none Head- atraumatic            Eyes- Gross vision intact, PERRLA, conjunctivae and secretions clear             Ears- Hearing, canals-normal            Nose- Clear, no-Septal dev, mucus, polyps, erosion, perforation             Throat- Mallampati II , mucosa clear , drainage- none, tonsils- atrophic Neck- flexible , trachea midline, no stridor , thyroid nl, carotid no bruit Chest - symmetrical  excursion , unlabored           Heart/CV- RRR , no murmur , no gallop  , no rub, nl s1 s2                           - JVD- none , edema- none, stasis changes- none, varices- none           Lung- clear to P&A, wheeze- none, cough- none , dullness-none, rub- none           Chest wall-  Abd- tender-no, distended-no, bowel sounds-present, HSM- no Br/ Gen/ Rectal- Not done, not indicated Extrem- cyanosis- none, clubbing, none, atrophy- none, strength- nl Neuro- grossly intact to observation

## 2012-06-13 LAB — REMOTE PACEMAKER DEVICE
AL AMPLITUDE: 5 mv
AL IMPEDENCE PM: 430 Ohm
AL THRESHOLD: 0.625 V
ATRIAL PACING PM: 23
BAMS-0001: 150 {beats}/min
BAMS-0003: 70 {beats}/min
BATTERY VOLTAGE: 2.95 V
DEVICE MODEL PM: 7267950
RV LEAD AMPLITUDE: 12 mv
RV LEAD IMPEDENCE PM: 410 Ohm
RV LEAD THRESHOLD: 0.875 V
VENTRICULAR PACING PM: 3.2

## 2012-06-15 ENCOUNTER — Telehealth: Payer: Self-pay | Admitting: Internal Medicine

## 2012-06-15 NOTE — Telephone Encounter (Signed)
Result Note     CXR- lungs are clear. Pacemaker seen. Borderline enlarged heart.No acute process.   I spoke with patient about results and he verbalized understanding and had no questions. Pt had no labs done. Nothing further was needed

## 2012-06-21 ENCOUNTER — Other Ambulatory Visit (HOSPITAL_COMMUNITY): Payer: Self-pay | Admitting: Nephrology

## 2012-06-21 DIAGNOSIS — G4733 Obstructive sleep apnea (adult) (pediatric): Secondary | ICD-10-CM | POA: Insufficient documentation

## 2012-06-21 NOTE — Assessment & Plan Note (Addendum)
Body habitus and history are consistent with obstructive sleep apnea. If left untreated, this will be an additional burden on his cardiac and renal function.We can  followup on it at future visit, after his pulmonary baseline is established, if appropriate.

## 2012-06-21 NOTE — Assessment & Plan Note (Signed)
Dyspnea on exertion seems particularly to correspond to intervals of increased fluid retention as indicated by weight and peripheral lymphedema. Plan-chest x-ray, PFT, 6 minute walk test, overnight oximetry

## 2012-06-22 ENCOUNTER — Encounter: Payer: Self-pay | Admitting: *Deleted

## 2012-06-23 ENCOUNTER — Encounter (HOSPITAL_COMMUNITY): Payer: Self-pay

## 2012-06-23 ENCOUNTER — Encounter (HOSPITAL_COMMUNITY)
Admission: RE | Admit: 2012-06-23 | Discharge: 2012-06-23 | Disposition: A | Discharge status: Home / Self Care | Payer: 59 | Source: Ambulatory Visit | Attending: Nephrology | Admitting: Nephrology

## 2012-06-23 DIAGNOSIS — E756 Lipid storage disorder, unspecified: Secondary | ICD-10-CM | POA: Insufficient documentation

## 2012-06-23 LAB — IRON AND TIBC
Iron: 64 ug/dL (ref 42–135)
Saturation Ratios: 21 % (ref 20–55)
TIBC: 298 ug/dL (ref 215–435)
UIBC: 234 ug/dL (ref 125–400)

## 2012-06-23 LAB — PHOSPHORUS: Phosphorus: 3.7 mg/dL (ref 2.3–4.6)

## 2012-06-23 LAB — CBC
HCT: 46 % (ref 39.0–52.0)
Hemoglobin: 15.8 g/dL (ref 13.0–17.0)
MCH: 30.9 pg (ref 26.0–34.0)
MCHC: 34.3 g/dL (ref 30.0–36.0)
MCV: 89.8 fL (ref 78.0–100.0)
Platelets: 173 10*3/uL (ref 150–400)
RBC: 5.12 MIL/uL (ref 4.22–5.81)
RDW: 12.6 % (ref 11.5–15.5)
WBC: 5.8 10*3/uL (ref 4.0–10.5)

## 2012-06-23 LAB — URINALYSIS, ROUTINE W REFLEX MICROSCOPIC
Bilirubin Urine: NEGATIVE
Glucose, UA: NEGATIVE mg/dL
Hgb urine dipstick: NEGATIVE
Ketones, ur: NEGATIVE mg/dL
Leukocytes, UA: NEGATIVE
Nitrite: NEGATIVE
Protein, ur: 100 mg/dL — AB
Specific Gravity, Urine: 1.012 (ref 1.005–1.030)
Urobilinogen, UA: 0.2 mg/dL (ref 0.0–1.0)
pH: 5.5 (ref 5.0–8.0)

## 2012-06-23 LAB — COMPREHENSIVE METABOLIC PANEL
ALT: 41 U/L (ref 0–53)
AST: 32 U/L (ref 0–37)
Albumin: 3.3 g/dL — ABNORMAL LOW (ref 3.5–5.2)
Alkaline Phosphatase: 68 U/L (ref 39–117)
BUN: 15 mg/dL (ref 6–23)
CO2: 24 mEq/L (ref 19–32)
Calcium: 9.1 mg/dL (ref 8.4–10.5)
Chloride: 106 mEq/L (ref 96–112)
Creatinine, Ser: 1.27 mg/dL (ref 0.50–1.35)
GFR calc Af Amer: 74 mL/min — ABNORMAL LOW (ref >90)
GFR calc non Af Amer: 64 mL/min — ABNORMAL LOW (ref >90)
Glucose, Bld: 112 mg/dL — ABNORMAL HIGH (ref 70–99)
Potassium: 3.9 mEq/L (ref 3.5–5.1)
Sodium: 141 mEq/L (ref 135–145)
Total Bilirubin: 0.2 mg/dL — ABNORMAL LOW (ref 0.3–1.2)
Total Protein: 7.6 g/dL (ref 6.0–8.3)

## 2012-06-23 LAB — DIFFERENTIAL
Basophils Absolute: 0 10*3/uL (ref 0.0–0.1)
Basophils Relative: 0 % (ref 0–1)
Eosinophils Absolute: 0.3 10*3/uL (ref 0.0–0.7)
Eosinophils Relative: 5 % (ref 0–5)
Lymphocytes Relative: 43 % (ref 12–46)
Lymphs Abs: 2.5 10*3/uL (ref 0.7–4.0)
Monocytes Absolute: 0.5 10*3/uL (ref 0.1–1.0)
Monocytes Relative: 8 % (ref 3–12)
Neutro Abs: 2.5 10*3/uL (ref 1.7–7.7)
Neutrophils Relative %: 44 % (ref 43–77)

## 2012-06-23 LAB — LIPID PANEL
Cholesterol: 298 mg/dL — ABNORMAL HIGH (ref 0–200)
HDL: 50 mg/dL (ref >39)
LDL Cholesterol: 215 mg/dL — ABNORMAL HIGH (ref 0–99)
Total CHOL/HDL Ratio: 6 RATIO
Triglycerides: 163 mg/dL — ABNORMAL HIGH (ref <150)
VLDL: 33 mg/dL (ref 0–40)

## 2012-06-23 LAB — URINE MICROSCOPIC-ADD ON

## 2012-06-23 LAB — FERRITIN: Ferritin: 286 ng/mL (ref 22–322)

## 2012-06-23 MED ORDER — AGALSIDASE BETA 5 MG IV SOLR
115.0000 mg | INTRAVENOUS | Status: DC
  Administered 2012-06-23: 115 mg via INTRAVENOUS
  Filled 2012-06-23: qty 23

## 2012-06-23 MED ORDER — SODIUM CHLORIDE 0.9 % IV SOLN
INTRAVENOUS | Status: DC
Start: 2012-06-23 — End: 2012-06-24
  Administered 2012-06-23: 10:00:00 via INTRAVENOUS

## 2012-07-04 ENCOUNTER — Ambulatory Visit (INDEPENDENT_AMBULATORY_CARE_PROVIDER_SITE_OTHER): Payer: 59 | Admitting: Internal Medicine

## 2012-07-04 DIAGNOSIS — R0989 Other specified symptoms and signs involving the circulatory and respiratory systems: Secondary | ICD-10-CM

## 2012-07-04 DIAGNOSIS — R0609 Other forms of dyspnea: Secondary | ICD-10-CM

## 2012-07-04 LAB — PULMONARY FUNCTION TEST

## 2012-07-04 NOTE — Progress Notes (Signed)
PFT done today. 

## 2012-07-07 ENCOUNTER — Encounter (HOSPITAL_COMMUNITY)
Admission: RE | Admit: 2012-07-07 | Discharge: 2012-07-07 | Disposition: A | Discharge status: Home / Self Care | Payer: 59 | Source: Ambulatory Visit | Attending: Nephrology | Admitting: Nephrology

## 2012-07-07 ENCOUNTER — Encounter (HOSPITAL_COMMUNITY): Payer: Self-pay

## 2012-07-07 MED ORDER — SODIUM CHLORIDE 0.9 % IV SOLN
115.0000 mg | INTRAVENOUS | Status: DC
  Administered 2012-07-07: 115 mg via INTRAVENOUS
  Filled 2012-07-07: qty 23

## 2012-07-07 MED ORDER — SODIUM CHLORIDE 0.9 % IV SOLN
INTRAVENOUS | Status: DC
  Administered 2012-07-07: 250 mL via INTRAVENOUS

## 2012-07-11 ENCOUNTER — Encounter: Payer: Self-pay | Admitting: Internal Medicine

## 2012-07-11 ENCOUNTER — Ambulatory Visit (INDEPENDENT_AMBULATORY_CARE_PROVIDER_SITE_OTHER): Payer: 59 | Admitting: Internal Medicine

## 2012-07-11 VITALS — BP 130/82 | HR 73 | Ht 69.0 in | Wt 277.2 lb

## 2012-07-11 DIAGNOSIS — R0600 Dyspnea, unspecified: Secondary | ICD-10-CM

## 2012-07-11 DIAGNOSIS — R609 Edema, unspecified: Secondary | ICD-10-CM

## 2012-07-11 DIAGNOSIS — Z23 Encounter for immunization: Secondary | ICD-10-CM

## 2012-07-11 DIAGNOSIS — G4733 Obstructive sleep apnea (adult) (pediatric): Secondary | ICD-10-CM

## 2012-07-11 DIAGNOSIS — R0609 Other forms of dyspnea: Secondary | ICD-10-CM

## 2012-07-11 DIAGNOSIS — R0989 Other specified symptoms and signs involving the circulatory and respiratory systems: Secondary | ICD-10-CM

## 2012-07-11 MED ORDER — MOMETASONE FURO-FORMOTEROL FUM 100-5 MCG/ACT IN AERO: 2.0000 | INHALATION_SPRAY | Freq: Two times a day (BID) | RESPIRATORY_TRACT | Status: DC

## 2012-07-11 MED ORDER — MOMETASONE FURO-FORMOTEROL FUM 100-5 MCG/ACT IN AERO: INHALATION_SPRAY | RESPIRATORY_TRACT | Status: DC

## 2012-07-11 MED ORDER — ALBUTEROL SULFATE HFA 108 (90 BASE) MCG/ACT IN AERS: 2.0000 | INHALATION_SPRAY | Freq: Four times a day (QID) | RESPIRATORY_TRACT | Status: DC | PRN

## 2012-07-11 NOTE — Progress Notes (Signed)
06/12/12- 66 yoM never smoker referred courtesy of Dr Moise Boring FOLLOWS FOR: severe wheezing. states that it is worse while he is trying to sleep. Known Fabry Disease X-linked glycolipid storage disease with acute on chronic renal insufficiency( Dr Arty Baumgartner), heart block/pacemaker (Dr Pernell Dupre), hypertension, DM, chronic lymphedema. He is treated with a replacement enzyme infusion twice monthly at [facility]. He has a long history of asthma with chronic bronchitis and abnormal spirometries first noted when he was in the [facility]. Breathing is worse in summer heat and worse in his house than when away. He has been better since moving from [place] to [place] in 2007. Remote allergy skin testing showed little reaction. He never smoked, but grew up in a smoking household. 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 He wheezes during sleep with cough and wheeze being his main complaints. Worse with deep breathing and when lying down. Shortness of breath tends to correspond to worse intervals of peripheral edema. Dyspnea on exertion has been present for years and is worse when he is doing prolonged walking, such as shopping, and on hills or stairs. History of shellfish allergy-joint swell, eyes swell to become slits, and itch. He is also concerned about snoring with suspicion that he has sleep apnea. He notices daytime tiredness but is alone and not aware that he stops breathing. CXR 08/20/11 IMPRESSION:  No acute disease  Original Report Authenticated By: Trecia Rogers, M.D.   07/11/12- 71 yoM never smoker with Fabry's disease, dyspnea on exertion, complicated by chronic renal insufficiency, heart block/ pacer, DM, chronic lymphedema. PCP Dr Kennon Holter Now concerned specifically about possible sleep apnea-no sleep study; also review 6MW and PFT results with patient.wheeze at night. Girlfriend tells him  he snores. Bedtime 10 PM, latency 45 minutes, waking 2 or 3 times before up at 6 AM. PFT: 07/04/2012-moderate obstructive airways disease with response to bronchodilator. Air-trapping with increased residual volumes, diffusion moderately reduced. FVC 3.02/64%, FEV1 1.57/46%, FEV1/FEC 0.5 to, TLC 85%, RV 160%, DLCO 72%. 6MWT- 07/04/12- 98%, 99%, 98%, 288 m. He had to stop after 4.6 minutes complaining of chest tightness and shortness of breath and hip pain. No oxygen limitation  ROS-see HPI Constitutional:   No-   weight loss, night sweats, fevers, chills, +fatigue, lassitude. HEENT:   No-  headaches, difficulty swallowing, tooth/dental problems, sore throat,       No-  sneezing, itching, ear ache, nasal congestion, post nasal drip,  CV:  No-   chest pain, orthopnea, PND, +swelling in lower extremities, no- anasarca,                                  dizziness, palpitations Resp: +  shortness of breath with exertion or at rest.              No-   productive cough,  + non-productive cough,  No- coughing up of blood.              No-   change in color of mucus.  + wheezing.   Skin: No-   rash or lesions. GI:  No-   heartburn, indigestion, abdominal pain, nausea, vomiting,  GU:  MS:  No-   joint pain or swelling.   Neuro-     nothing unusual Psych:  No- change in mood or affect. No depression  or anxiety.  No memory loss.  OBJ- Physical Exam General- Alert, Oriented, Affect-appropriate, Distress- none acute. Coarse facial features.overweight Skin- rash-none, lesions- none, excoriation- none Lymphadenopathy- none Head- atraumatic            Eyes- Gross vision intact, PERRLA, conjunctivae and secretions clear            Ears- Hearing, canals-normal            Nose- Clear, no-Septal dev, mucus, polyps, erosion, perforation             Throat- Mallampati IV , mucosa clear , drainage- none, tonsils- atrophic Neck- flexible , trachea midline, no stridor , thyroid nl, carotid no bruit Chest -  symmetrical excursion , unlabored           Heart/CV- RRR , no murmur , no gallop  , no rub, nl s1 s2                           - JVD- none , edema+ severe, +stasis changes, varices- none           Lung- clear to P&A, wheeze- none, cough- none , dullness-none, rub- none           Chest wall- L pacemaker Abd-  Br/ Gen/ Rectal- Not done, not indicated Extrem- cyanosis- none, clubbing, none, atrophy- none, strength- nl Neuro- grossly intact to observation

## 2012-07-11 NOTE — Patient Instructions (Addendum)
Sample and script for [facility]  Maintenance controller     2 puffs then rinse, twice every day  Script for Proair albuterol rescue inhaler   2 puffs, 4 times daily as needed  Order- schedule split protocol NPSG    Dx OSA

## 2012-07-12 ENCOUNTER — Encounter: Payer: Self-pay | Admitting: Internal Medicine

## 2012-07-14 NOTE — Progress Notes (Signed)
Documentation for 6 Minute Walk Test

## 2012-07-19 ENCOUNTER — Other Ambulatory Visit (HOSPITAL_COMMUNITY): Payer: Self-pay | Admitting: Nephrology

## 2012-07-20 DIAGNOSIS — R609 Edema, unspecified: Secondary | ICD-10-CM | POA: Insufficient documentation

## 2012-07-20 NOTE — Assessment & Plan Note (Signed)
Moderate to severe obstructive airways disease. He describes exposure only to secondhand smoke. 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 Dyspnea is increased by his heart disease and lymphedema. Plan-Dulera 100 and albuterol rescue inhaler with appropriate discussion

## 2012-07-21 ENCOUNTER — Encounter (HOSPITAL_COMMUNITY): Payer: Self-pay

## 2012-07-21 ENCOUNTER — Encounter (HOSPITAL_COMMUNITY)
Admission: RE | Admit: 2012-07-21 | Discharge: 2012-07-21 | Disposition: A | Discharge status: Home / Self Care | Payer: 59 | Source: Ambulatory Visit | Attending: Nephrology | Admitting: Nephrology

## 2012-07-21 DIAGNOSIS — E756 Lipid storage disorder, unspecified: Secondary | ICD-10-CM | POA: Insufficient documentation

## 2012-07-21 LAB — COMPREHENSIVE METABOLIC PANEL
ALT: 36 U/L (ref 0–53)
AST: 33 U/L (ref 0–37)
Albumin: 2.9 g/dL — ABNORMAL LOW (ref 3.5–5.2)
Alkaline Phosphatase: 62 U/L (ref 39–117)
BUN: 14 mg/dL (ref 6–23)
CO2: 23 mEq/L (ref 19–32)
Calcium: 8.7 mg/dL (ref 8.4–10.5)
Chloride: 101 mEq/L (ref 96–112)
Creatinine, Ser: 1.29 mg/dL (ref 0.50–1.35)
GFR calc Af Amer: 73 mL/min — ABNORMAL LOW (ref >90)
GFR calc non Af Amer: 63 mL/min — ABNORMAL LOW (ref >90)
Glucose, Bld: 199 mg/dL — ABNORMAL HIGH (ref 70–99)
Potassium: 3.7 mEq/L (ref 3.5–5.1)
Sodium: 135 mEq/L (ref 135–145)
Total Bilirubin: 0.2 mg/dL — ABNORMAL LOW (ref 0.3–1.2)
Total Protein: 7 g/dL (ref 6.0–8.3)

## 2012-07-21 LAB — URINALYSIS, ROUTINE W REFLEX MICROSCOPIC
Bilirubin Urine: NEGATIVE
Glucose, UA: NEGATIVE mg/dL
Hgb urine dipstick: NEGATIVE
Ketones, ur: NEGATIVE mg/dL
Leukocytes, UA: NEGATIVE
Nitrite: NEGATIVE
Protein, ur: 100 mg/dL — AB
Specific Gravity, Urine: 1.013 (ref 1.005–1.030)
Urobilinogen, UA: 0.2 mg/dL (ref 0.0–1.0)
pH: 5.5 (ref 5.0–8.0)

## 2012-07-21 LAB — URINE MICROSCOPIC-ADD ON

## 2012-07-21 LAB — PHOSPHORUS: Phosphorus: 3 mg/dL (ref 2.3–4.6)

## 2012-07-21 MED ORDER — SODIUM CHLORIDE 0.9 % IV SOLN
115.0000 mg | INTRAVENOUS | Status: AC
  Administered 2012-07-21: 115 mg via INTRAVENOUS
  Filled 2012-07-21: qty 23

## 2012-07-21 MED ORDER — SODIUM CHLORIDE 0.9 % IV SOLN
INTRAVENOUS | Status: AC
  Administered 2012-07-21: 10:00:00 via INTRAVENOUS

## 2012-07-22 LAB — URINE CULTURE
Colony Count: NO GROWTH
Culture: NO GROWTH

## 2012-08-01 ENCOUNTER — Ambulatory Visit (HOSPITAL_BASED_OUTPATIENT_CLINIC_OR_DEPARTMENT_OTHER): Payer: 59 | Attending: Internal Medicine | Admitting: Radiology

## 2012-08-01 VITALS — Ht 69.0 in | Wt 270.0 lb

## 2012-08-01 DIAGNOSIS — G4730 Sleep apnea, unspecified: Secondary | ICD-10-CM | POA: Insufficient documentation

## 2012-08-01 DIAGNOSIS — G4733 Obstructive sleep apnea (adult) (pediatric): Secondary | ICD-10-CM

## 2012-08-01 DIAGNOSIS — G4710 Hypersomnia, unspecified: Secondary | ICD-10-CM | POA: Insufficient documentation

## 2012-08-04 ENCOUNTER — Encounter (HOSPITAL_COMMUNITY): Payer: Self-pay

## 2012-08-04 ENCOUNTER — Encounter (HOSPITAL_COMMUNITY)
Admission: RE | Admit: 2012-08-04 | Discharge: 2012-08-04 | Disposition: A | Discharge status: Home / Self Care | Payer: 59 | Source: Ambulatory Visit | Attending: Nephrology | Admitting: Nephrology

## 2012-08-04 MED ORDER — SODIUM CHLORIDE 0.9 % IV SOLN
115.0000 mg | INTRAVENOUS | Status: AC
  Administered 2012-08-04: 115 mg via INTRAVENOUS
  Filled 2012-08-04: qty 23

## 2012-08-04 MED ORDER — SODIUM CHLORIDE 0.9 % IV SOLN
INTRAVENOUS | Status: AC
  Administered 2012-08-04: 10:00:00 via INTRAVENOUS

## 2012-08-05 DIAGNOSIS — R0989 Other specified symptoms and signs involving the circulatory and respiratory systems: Secondary | ICD-10-CM

## 2012-08-05 DIAGNOSIS — R0609 Other forms of dyspnea: Secondary | ICD-10-CM

## 2012-08-05 DIAGNOSIS — G4710 Hypersomnia, unspecified: Secondary | ICD-10-CM

## 2012-08-05 DIAGNOSIS — G4730 Sleep apnea, unspecified: Secondary | ICD-10-CM

## 2012-08-05 NOTE — Procedures (Signed)
NAME:  [name], [name] NO.:  [id number]  MEDICAL RECORD NO.:  [id number]          PATIENT TYPE:  OUT  LOCATION:  SLEEP CENTER                 FACILITY:  [facility]  PHYSICIAN:  Samiyyah Moffa D. Annamaria Boots, MD, FCCP, FACPDATE OF BIRTH:  11/02/60  DATE OF STUDY:  08/01/2012                           NOCTURNAL POLYSOMNOGRAM  REFERRING PHYSICIAN:  Aldrich Lloyd D. Jahzir Strohmeier, MD, FCCP, FACP  INDICATION FOR STUDY:  Hypersomnia with sleep apnea.  EPWORTH SLEEPINESS SCORE:  16/24.  BMI 39.9, weight 270 pounds, height 69 inches, neck 16.5 inches.  MEDICATIONS:  Home medications charted and reviewed.  SLEEP ARCHITECTURE:  Total sleep time 206.5 minutes with sleep efficiency 55.8%.  Stage I was 6.1%, stage II 73.1%, stage III absent. REM 20.8% of total sleep time.  Sleep latency 44 minutes, REM latency 88.5 minutes, awake after sleep onset 18.5 minutes.  Arousal index 9.3.  BEDTIME MEDICATION:  Dulera.  RESPIRATORY DATA:  Apnea hypotony index (AHI) 1.7 per hour.  A total of 6 events was scored including 3 obstructive apneas, 1 central apnea, 2 hypopneas.  Events were associated with nonsupine sleep position and REM.  REM AHI 8.4 per hour.  There were not enough events to permit application of split protocol CPAP titration.  OXYGEN DATA:  Moderate snoring with oxygen desaturation to a nadir of 91% and mean oxygen saturation through the study of 96.5% on room air.  CARDIAC DATA:  Pacemaker.  Occasional PVC.  MOVEMENT-PARASOMNIA:  No significant movement disturbance.  Bathroom x1.  IMPRESSION-RECOMMENDATION: 1. Occasional respiratory event with sleep disturbance, within normal     limits.  AHI 1.7 per hour (the normal range for adults is from 0-5     events per hour).  Moderate snoring with oxygen desaturation to a     nadir of 91% and mean oxygen saturation through the study of 96.5%     on room air. 2. Sleep architecture was unremarkable except for delayed sleep onset     until 11:30  p.m. and then spontaneous waking with no further sleep     shortly after 3 a.m.     Margaretann Abate D. Annamaria Boots, MD, [facility], FACP Diplomate, [place] Board of Sleep Medicine    CDY/MEDQ  D:  08/05/2012 14:59:10  T:  08/05/2012 23:47:25  Job:  [id number]

## 2012-08-14 ENCOUNTER — Ambulatory Visit (INDEPENDENT_AMBULATORY_CARE_PROVIDER_SITE_OTHER): Payer: 59 | Admitting: Internal Medicine

## 2012-08-14 ENCOUNTER — Encounter: Payer: Self-pay | Admitting: Internal Medicine

## 2012-08-14 VITALS — BP 128/70 | HR 65 | Ht 69.0 in | Wt 277.4 lb

## 2012-08-14 DIAGNOSIS — J449 Chronic obstructive pulmonary disease, unspecified: Secondary | ICD-10-CM

## 2012-08-14 DIAGNOSIS — G4733 Obstructive sleep apnea (adult) (pediatric): Secondary | ICD-10-CM

## 2012-08-14 MED ORDER — AZELASTINE-FLUTICASONE 137-50 MCG/ACT NA SUSP: 1.0000 | Freq: Every day | NASAL | Status: DC

## 2012-08-14 MED ORDER — MOMETASONE FURO-FORMOTEROL FUM 100-5 MCG/ACT IN AERO: 2.0000 | INHALATION_SPRAY | Freq: Two times a day (BID) | RESPIRATORY_TRACT | Status: DC

## 2012-08-14 NOTE — Progress Notes (Signed)
06/12/12- 64 yoM never smoker referred courtesy of Dr Moise Boring FOLLOWS FOR: severe wheezing. states that it is worse while he is trying to sleep. Known Fabry Disease X-linked glycolipid storage disease with acute on chronic renal insufficiency( Dr Arty Baumgartner), heart block/pacemaker (Dr Pernell Dupre), hypertension, DM, chronic lymphedema. He is treated with a replacement enzyme infusion twice monthly at [facility]. He has a long history of asthma with chronic bronchitis and abnormal spirometries first noted when he was in the [facility]. Breathing is worse in summer heat and worse in his house than when away. He has been better since moving from [place] to [place] in 2007. Remote allergy skin testing showed little reaction. He never smoked, but grew up in a smoking household. 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 He wheezes during sleep with cough and wheeze being his main complaints. Worse with deep breathing and when lying down. Shortness of breath tends to correspond to worse intervals of peripheral edema. Dyspnea on exertion has been present for years and is worse when he is doing prolonged walking, such as shopping, and on hills or stairs. History of shellfish allergy-joint swell, eyes swell to become slits, and itch. He is also concerned about snoring with suspicion that he has sleep apnea. He notices daytime tiredness but is alone and not aware that he stops breathing. CXR 08/20/11 IMPRESSION:  No acute disease  Original Report Authenticated By: Trecia Rogers, M.D.   07/11/12- 33 yoM never smoker with Fabry's disease, dyspnea on exertion, complicated by chronic renal insufficiency, heart block/ pacer, DM, chronic lymphedema. PCP Dr Kennon Holter Now concerned specifically about possible sleep apnea-no sleep study; also review 6MW and PFT results with patient.wheeze at night. Girlfriend tells him  he snores. Bedtime 10 PM, latency 45 minutes, waking 2 or 3 times before up at 6 AM. PFT: 07/04/2012-moderate obstructive airways disease with response to bronchodilator. Air-trapping with increased residual volumes, diffusion moderately reduced. FVC 3.02/64%, FEV1 1.57/46%, FEV1/FEC 0.5 to, TLC 85%, RV 160%, DLCO 72%. 6MWT- 07/04/12- 98%, 99%, 98%, 288 m. He had to stop after 4.6 minutes complaining of chest tightness and shortness of breath and hip pain. No oxygen limitation  08/14/12- 54 yoM never smoker with Fabry's disease, dyspnea on exertion, complicated by chronic renal insufficiency, heart block/ pacer, DM, chronic lymphedema. PCP Dr Kennon Holter FOLLOWS FOR: review sleep study with patient. NPSG 08/01/12- AHI 1.7/ hr, within normal limits, weight 270 lbs. We discussed sleep apnea and normal range events. He is encouraged to keep his weight down. His breathing has done better. Dulera helps.  ROS-see HPI Constitutional:   No-   weight loss, night sweats, fevers, chills, +fatigue, lassitude. HEENT:   No-  headaches, difficulty swallowing, tooth/dental problems, sore throat,       No-  sneezing, itching, ear ache, nasal congestion, post nasal drip,  CV:  No-   chest pain, orthopnea, PND, +swelling in lower extremities, no- anasarca,                                  dizziness, palpitations Resp: + less shortness of breath with exertion or at rest.              No-   productive cough,  + non-productive cough,  No- coughing up of blood.  No-   change in color of mucus.  No- wheezing.   Skin: No-   rash or lesions. GI:  No-   heartburn, indigestion, abdominal pain, nausea, vomiting,  GU:  MS:  No-   joint pain or swelling.   Neuro-     nothing unusual Psych:  No- change in mood or affect. No depression or anxiety.  No memory loss.  OBJ- Physical Exam General- Alert, Oriented, Affect-appropriate, Distress- none acute. Coarse facial features.overweight Skin- rash-none, lesions- none,  excoriation- none Lymphadenopathy- none Head- atraumatic            Eyes- Gross vision intact, PERRLA, conjunctivae and secretions clear            Ears- Hearing, canals-normal            Nose- +prominent turbinates, no-Septal dev, mucus, polyps, erosion, perforation             Throat- Mallampati IV , mucosa clear , drainage- none, tonsils- atrophic Neck- flexible , trachea midline, no stridor , thyroid nl, carotid no bruit Chest - symmetrical excursion , unlabored           Heart/CV- RRR , no murmur , no gallop  , no rub, nl s1 s2                           - JVD- none , edema+ severe, +stasis changes, varices- none           Lung- clear to P&A, wheeze- none, cough- none , dullness-none, rub- none           Chest wall- L pacemaker Abd-  Br/ Gen/ Rectal- Not done, not indicated Extrem- cyanosis- none, clubbing, none, atrophy- none, strength- nl Neuro- grossly intact to observation

## 2012-08-14 NOTE — Patient Instructions (Addendum)
Sample dymista nasal spray     1-2 puffs each night at bedtime  swee if it reduces nasal stuffiness and snoring.  People usually snore less lying on sides or stomach, rather than on the back.  Script to refill [facility]

## 2012-08-15 ENCOUNTER — Other Ambulatory Visit (HOSPITAL_COMMUNITY): Payer: Self-pay | Admitting: Nephrology

## 2012-08-17 NOTE — Assessment & Plan Note (Signed)
[name] has been helpful as discussed.

## 2012-08-17 NOTE — Assessment & Plan Note (Signed)
Somewhat to my surprise, he does not have sleep apnea based on the sleep study. We reviewed sleep hygiene, snoring and expectations.

## 2012-08-18 ENCOUNTER — Encounter (HOSPITAL_COMMUNITY): Payer: Self-pay

## 2012-08-18 ENCOUNTER — Encounter (HOSPITAL_COMMUNITY)
Admission: RE | Admit: 2012-08-18 | Discharge: 2012-08-18 | Disposition: A | Discharge status: Home / Self Care | Payer: 59 | Source: Ambulatory Visit | Attending: Nephrology | Admitting: Nephrology

## 2012-08-18 DIAGNOSIS — E756 Lipid storage disorder, unspecified: Secondary | ICD-10-CM | POA: Insufficient documentation

## 2012-08-18 LAB — COMPREHENSIVE METABOLIC PANEL
ALT: 44 U/L (ref 0–53)
AST: 36 U/L (ref 0–37)
Albumin: 3.1 g/dL — ABNORMAL LOW (ref 3.5–5.2)
Alkaline Phosphatase: 73 U/L (ref 39–117)
BUN: 17 mg/dL (ref 6–23)
CO2: 23 mEq/L (ref 19–32)
Calcium: 8.7 mg/dL (ref 8.4–10.5)
Chloride: 104 mEq/L (ref 96–112)
Creatinine, Ser: 1.38 mg/dL — ABNORMAL HIGH (ref 0.50–1.35)
GFR calc Af Amer: 67 mL/min — ABNORMAL LOW (ref >90)
GFR calc non Af Amer: 58 mL/min — ABNORMAL LOW (ref >90)
Glucose, Bld: 148 mg/dL — ABNORMAL HIGH (ref 70–99)
Potassium: 4 mEq/L (ref 3.5–5.1)
Sodium: 139 mEq/L (ref 135–145)
Total Bilirubin: 0.2 mg/dL — ABNORMAL LOW (ref 0.3–1.2)
Total Protein: 7.5 g/dL (ref 6.0–8.3)

## 2012-08-18 LAB — URINALYSIS, ROUTINE W REFLEX MICROSCOPIC
Bilirubin Urine: NEGATIVE
Glucose, UA: NEGATIVE mg/dL
Hgb urine dipstick: NEGATIVE
Ketones, ur: NEGATIVE mg/dL
Leukocytes, UA: NEGATIVE
Nitrite: NEGATIVE
Protein, ur: 100 mg/dL — AB
Specific Gravity, Urine: 1.016 (ref 1.005–1.030)
Urobilinogen, UA: 0.2 mg/dL (ref 0.0–1.0)
pH: 5 (ref 5.0–8.0)

## 2012-08-18 LAB — URINE MICROSCOPIC-ADD ON

## 2012-08-18 LAB — PHOSPHORUS: Phosphorus: 4.2 mg/dL (ref 2.3–4.6)

## 2012-08-18 MED ORDER — SODIUM CHLORIDE 0.9 % IV SOLN
115.0000 mg | INTRAVENOUS | Status: AC
  Administered 2012-08-18: 115 mg via INTRAVENOUS
  Filled 2012-08-18: qty 23

## 2012-08-18 MED ORDER — SODIUM CHLORIDE 0.9 % IV SOLN
INTRAVENOUS | Status: AC
  Administered 2012-08-18: 09:00:00 via INTRAVENOUS

## 2012-09-01 ENCOUNTER — Encounter (HOSPITAL_COMMUNITY): Payer: Self-pay

## 2012-09-01 ENCOUNTER — Encounter (HOSPITAL_COMMUNITY)
Admission: RE | Admit: 2012-09-01 | Discharge: 2012-09-01 | Disposition: A | Discharge status: Home / Self Care | Payer: 59 | Source: Ambulatory Visit | Attending: Nephrology | Admitting: Nephrology

## 2012-09-01 MED ORDER — AGALSIDASE BETA 5 MG IV SOLR
115.0000 mg | INTRAVENOUS | Status: DC
  Administered 2012-09-01: 115 mg via INTRAVENOUS
  Filled 2012-09-01: qty 23

## 2012-09-01 MED ORDER — SODIUM CHLORIDE 0.9 % IV SOLN
INTRAVENOUS | Status: DC
  Administered 2012-09-01: 20 mL/h via INTRAVENOUS

## 2012-09-11 ENCOUNTER — Encounter: Payer: 59 | Admitting: *Deleted

## 2012-09-13 ENCOUNTER — Encounter: Payer: Self-pay | Admitting: *Deleted

## 2012-09-14 ENCOUNTER — Other Ambulatory Visit (HOSPITAL_COMMUNITY): Payer: Self-pay | Admitting: Nephrology

## 2012-09-15 ENCOUNTER — Encounter (HOSPITAL_COMMUNITY): Payer: Self-pay

## 2012-09-15 ENCOUNTER — Encounter (HOSPITAL_COMMUNITY)
Admission: RE | Admit: 2012-09-15 | Discharge: 2012-09-15 | Disposition: A | Discharge status: Home / Self Care | Payer: 59 | Source: Ambulatory Visit | Attending: Nephrology | Admitting: Nephrology

## 2012-09-15 DIAGNOSIS — E756 Lipid storage disorder, unspecified: Secondary | ICD-10-CM | POA: Insufficient documentation

## 2012-09-15 LAB — COMPREHENSIVE METABOLIC PANEL
ALT: 43 U/L (ref 0–53)
AST: 34 U/L (ref 0–37)
Albumin: 3.1 g/dL — ABNORMAL LOW (ref 3.5–5.2)
Alkaline Phosphatase: 63 U/L (ref 39–117)
BUN: 18 mg/dL (ref 6–23)
CO2: 24 mEq/L (ref 19–32)
Calcium: 9.1 mg/dL (ref 8.4–10.5)
Chloride: 105 mEq/L (ref 96–112)
Creatinine, Ser: 1.41 mg/dL — ABNORMAL HIGH (ref 0.50–1.35)
GFR calc Af Amer: 65 mL/min — ABNORMAL LOW (ref >90)
GFR calc non Af Amer: 56 mL/min — ABNORMAL LOW (ref >90)
Glucose, Bld: 152 mg/dL — ABNORMAL HIGH (ref 70–99)
Potassium: 3.9 mEq/L (ref 3.5–5.1)
Sodium: 139 mEq/L (ref 135–145)
Total Bilirubin: 0.3 mg/dL (ref 0.3–1.2)
Total Protein: 7.1 g/dL (ref 6.0–8.3)

## 2012-09-15 LAB — URINALYSIS, ROUTINE W REFLEX MICROSCOPIC
Bilirubin Urine: NEGATIVE
Glucose, UA: NEGATIVE mg/dL
Hgb urine dipstick: NEGATIVE
Ketones, ur: NEGATIVE mg/dL
Leukocytes, UA: NEGATIVE
Nitrite: NEGATIVE
Protein, ur: 30 mg/dL — AB
Specific Gravity, Urine: 1.015 (ref 1.005–1.030)
Urobilinogen, UA: 0.2 mg/dL (ref 0.0–1.0)
pH: 5.5 (ref 5.0–8.0)

## 2012-09-15 LAB — PHOSPHORUS: Phosphorus: 3.7 mg/dL (ref 2.3–4.6)

## 2012-09-15 LAB — URINE MICROSCOPIC-ADD ON

## 2012-09-15 MED ORDER — SODIUM CHLORIDE 0.9 % IV SOLN
115.0000 mg | INTRAVENOUS | Status: DC
  Administered 2012-09-15: 115 mg via INTRAVENOUS
  Filled 2012-09-15: qty 23

## 2012-09-15 MED ORDER — SODIUM CHLORIDE 0.9 % IV SOLN
INTRAVENOUS | Status: DC
  Administered 2012-09-15: 12:00:00 via INTRAVENOUS

## 2012-09-26 NOTE — Progress Notes (Signed)
Called patient and reminded him that he is to have a "fasting lipid" level drawn at his appointment 09/29/12 and he should not eat anything after midnight 09/28/12. Pt verbalized understanding

## 2012-09-29 ENCOUNTER — Encounter (HOSPITAL_COMMUNITY)
Admission: RE | Admit: 2012-09-29 | Discharge: 2012-09-29 | Disposition: A | Discharge status: Home / Self Care | Payer: 59 | Source: Ambulatory Visit | Attending: Nephrology | Admitting: Nephrology

## 2012-09-29 ENCOUNTER — Other Ambulatory Visit (HOSPITAL_COMMUNITY): Payer: Self-pay | Admitting: Nephrology

## 2012-09-29 ENCOUNTER — Encounter (HOSPITAL_COMMUNITY): Payer: Self-pay

## 2012-09-29 LAB — LIPID PANEL
Cholesterol: 273 mg/dL — ABNORMAL HIGH (ref 0–200)
HDL: 47 mg/dL (ref >39)
LDL Cholesterol: 191 mg/dL — ABNORMAL HIGH (ref 0–99)
Total CHOL/HDL Ratio: 5.8 RATIO
Triglycerides: 174 mg/dL — ABNORMAL HIGH (ref <150)
VLDL: 35 mg/dL (ref 0–40)

## 2012-09-29 LAB — IRON AND TIBC
Iron: 88 ug/dL (ref 42–135)
Saturation Ratios: 28 % (ref 20–55)
TIBC: 309 ug/dL (ref 215–435)
UIBC: 221 ug/dL (ref 125–400)

## 2012-09-29 LAB — CBC WITH DIFFERENTIAL/PLATELET
Basophils Absolute: 0 10*3/uL (ref 0.0–0.1)
Basophils Relative: 0 % (ref 0–1)
Eosinophils Absolute: 0.3 10*3/uL (ref 0.0–0.7)
Eosinophils Relative: 5 % (ref 0–5)
HCT: 44.8 % (ref 39.0–52.0)
Hemoglobin: 15.5 g/dL (ref 13.0–17.0)
Lymphocytes Relative: 41 % (ref 12–46)
Lymphs Abs: 2.6 10*3/uL (ref 0.7–4.0)
MCH: 30.5 pg (ref 26.0–34.0)
MCHC: 34.6 g/dL (ref 30.0–36.0)
MCV: 88 fL (ref 78.0–100.0)
Monocytes Absolute: 0.7 10*3/uL (ref 0.1–1.0)
Monocytes Relative: 11 % (ref 3–12)
Neutro Abs: 2.7 10*3/uL (ref 1.7–7.7)
Neutrophils Relative %: 44 % (ref 43–77)
Platelets: 173 10*3/uL (ref 150–400)
RBC: 5.09 MIL/uL (ref 4.22–5.81)
RDW: 12.5 % (ref 11.5–15.5)
WBC: 6.3 10*3/uL (ref 4.0–10.5)

## 2012-09-29 LAB — FERRITIN: Ferritin: 476 ng/mL — ABNORMAL HIGH (ref 22–322)

## 2012-09-29 MED ORDER — SODIUM CHLORIDE 0.9 % IV SOLN
INTRAVENOUS | Status: DC
  Administered 2012-09-29: 09:00:00 via INTRAVENOUS

## 2012-09-29 MED ORDER — SODIUM CHLORIDE 0.9 % IV SOLN
115.0000 mg | INTRAVENOUS | Status: DC
  Administered 2012-09-29: 115 mg via INTRAVENOUS
  Filled 2012-09-29: qty 23

## 2012-10-13 ENCOUNTER — Encounter (HOSPITAL_COMMUNITY)
Admission: RE | Admit: 2012-10-13 | Discharge: 2012-10-13 | Disposition: A | Discharge status: Home / Self Care | Payer: 59 | Source: Ambulatory Visit | Attending: Nephrology | Admitting: Nephrology

## 2012-10-13 ENCOUNTER — Encounter (HOSPITAL_COMMUNITY): Payer: Self-pay

## 2012-10-13 DIAGNOSIS — E756 Lipid storage disorder, unspecified: Secondary | ICD-10-CM | POA: Insufficient documentation

## 2012-10-13 LAB — COMPREHENSIVE METABOLIC PANEL
ALT: 45 U/L (ref 0–53)
AST: 36 U/L (ref 0–37)
Albumin: 3.4 g/dL — ABNORMAL LOW (ref 3.5–5.2)
Alkaline Phosphatase: 62 U/L (ref 39–117)
BUN: 21 mg/dL (ref 6–23)
CO2: 23 mEq/L (ref 19–32)
Calcium: 9.5 mg/dL (ref 8.4–10.5)
Chloride: 104 mEq/L (ref 96–112)
Creatinine, Ser: 1.45 mg/dL — ABNORMAL HIGH (ref 0.50–1.35)
GFR calc Af Amer: 63 mL/min — ABNORMAL LOW (ref >90)
GFR calc non Af Amer: 54 mL/min — ABNORMAL LOW (ref >90)
Glucose, Bld: 139 mg/dL — ABNORMAL HIGH (ref 70–99)
Potassium: 4.2 mEq/L (ref 3.5–5.1)
Sodium: 139 mEq/L (ref 135–145)
Total Bilirubin: 0.3 mg/dL (ref 0.3–1.2)
Total Protein: 7.6 g/dL (ref 6.0–8.3)

## 2012-10-13 LAB — URINALYSIS, ROUTINE W REFLEX MICROSCOPIC
Bilirubin Urine: NEGATIVE
Glucose, UA: NEGATIVE mg/dL
Hgb urine dipstick: NEGATIVE
Ketones, ur: NEGATIVE mg/dL
Leukocytes, UA: NEGATIVE
Nitrite: NEGATIVE
Protein, ur: NEGATIVE mg/dL
Specific Gravity, Urine: 1.011 (ref 1.005–1.030)
Urobilinogen, UA: 0.2 mg/dL (ref 0.0–1.0)
pH: 5.5 (ref 5.0–8.0)

## 2012-10-13 LAB — PHOSPHORUS: Phosphorus: 4.6 mg/dL (ref 2.3–4.6)

## 2012-10-13 MED ORDER — SODIUM CHLORIDE 0.9 % IV SOLN
INTRAVENOUS | Status: DC
  Administered 2012-10-13: 09:00:00 via INTRAVENOUS

## 2012-10-13 MED ORDER — SODIUM CHLORIDE 0.9 % IV SOLN
115.0000 mg | INTRAVENOUS | Status: DC
  Administered 2012-10-13: 115 mg via INTRAVENOUS
  Filled 2012-10-13: qty 23

## 2012-10-27 ENCOUNTER — Encounter (HOSPITAL_COMMUNITY): Payer: Medicare Other

## 2012-10-27 ENCOUNTER — Other Ambulatory Visit (HOSPITAL_COMMUNITY): Payer: Self-pay | Admitting: Nephrology

## 2012-10-27 ENCOUNTER — Encounter (HOSPITAL_COMMUNITY): Payer: Self-pay

## 2012-10-27 ENCOUNTER — Encounter (HOSPITAL_COMMUNITY)
Admission: RE | Admit: 2012-10-27 | Discharge: 2012-10-27 | Disposition: A | Discharge status: Home / Self Care | Payer: 59 | Source: Ambulatory Visit | Attending: Nephrology | Admitting: Nephrology

## 2012-10-27 MED ORDER — SODIUM CHLORIDE 0.9 % IV SOLN
INTRAVENOUS | Status: DC
  Administered 2012-10-27: 10:00:00 via INTRAVENOUS

## 2012-10-27 MED ORDER — AGALSIDASE BETA 5 MG IV SOLR
115.0000 mg | INTRAVENOUS | Status: DC
  Administered 2012-10-27: 115 mg via INTRAVENOUS
  Filled 2012-10-27: qty 23

## 2012-10-27 NOTE — Progress Notes (Signed)
Out ambulatory. Home by self

## 2012-10-27 NOTE — Progress Notes (Signed)
Dr.[name]'s office notified to clarify if ok to give Fabryzyme one day early on July 3,14, due to July 4 holiday, short stay is closed.  Message was left with office.

## 2012-11-10 ENCOUNTER — Encounter (HOSPITAL_COMMUNITY)
Admission: RE | Admit: 2012-11-10 | Discharge: 2012-11-10 | Disposition: A | Discharge status: Home / Self Care | Payer: 59 | Source: Ambulatory Visit | Attending: Nephrology | Admitting: Nephrology

## 2012-11-10 ENCOUNTER — Other Ambulatory Visit (HOSPITAL_COMMUNITY): Payer: Self-pay | Admitting: Nephrology

## 2012-11-10 ENCOUNTER — Encounter (HOSPITAL_COMMUNITY): Payer: Self-pay

## 2012-11-10 DIAGNOSIS — E756 Lipid storage disorder, unspecified: Secondary | ICD-10-CM | POA: Insufficient documentation

## 2012-11-10 LAB — COMPREHENSIVE METABOLIC PANEL
ALT: 32 U/L (ref 0–53)
AST: 31 U/L (ref 0–37)
Albumin: 3.5 g/dL (ref 3.5–5.2)
Alkaline Phosphatase: 60 U/L (ref 39–117)
BUN: 23 mg/dL (ref 6–23)
CO2: 24 mEq/L (ref 19–32)
Calcium: 9.2 mg/dL (ref 8.4–10.5)
Chloride: 102 mEq/L (ref 96–112)
Creatinine, Ser: 1.42 mg/dL — ABNORMAL HIGH (ref 0.50–1.35)
GFR calc Af Amer: 65 mL/min — ABNORMAL LOW (ref >90)
GFR calc non Af Amer: 56 mL/min — ABNORMAL LOW (ref >90)
Glucose, Bld: 128 mg/dL — ABNORMAL HIGH (ref 70–99)
Potassium: 3.9 mEq/L (ref 3.5–5.1)
Sodium: 137 mEq/L (ref 135–145)
Total Bilirubin: 0.3 mg/dL (ref 0.3–1.2)
Total Protein: 7.8 g/dL (ref 6.0–8.3)

## 2012-11-10 LAB — URINALYSIS, ROUTINE W REFLEX MICROSCOPIC
Bilirubin Urine: NEGATIVE
Glucose, UA: NEGATIVE mg/dL
Hgb urine dipstick: NEGATIVE
Ketones, ur: NEGATIVE mg/dL
Leukocytes, UA: NEGATIVE
Nitrite: NEGATIVE
Protein, ur: NEGATIVE mg/dL
Specific Gravity, Urine: 1.009 (ref 1.005–1.030)
Urobilinogen, UA: 0.2 mg/dL (ref 0.0–1.0)
pH: 6 (ref 5.0–8.0)

## 2012-11-10 LAB — PHOSPHORUS: Phosphorus: 3.6 mg/dL (ref 2.3–4.6)

## 2012-11-10 MED ORDER — SODIUM CHLORIDE 0.9 % IV SOLN
115.0000 mg | INTRAVENOUS | Status: DC
  Administered 2012-11-10: 115 mg via INTRAVENOUS
  Filled 2012-11-10: qty 23

## 2012-11-10 MED ORDER — SODIUM CHLORIDE 0.9 % IV SOLN
INTRAVENOUS | Status: DC
  Administered 2012-11-10: 250 mL via INTRAVENOUS

## 2012-11-24 ENCOUNTER — Encounter (HOSPITAL_COMMUNITY)
Admission: RE | Admit: 2012-11-24 | Discharge: 2012-11-24 | Disposition: A | Discharge status: Home / Self Care | Payer: 59 | Source: Ambulatory Visit | Attending: Nephrology | Admitting: Nephrology

## 2012-11-24 ENCOUNTER — Encounter (HOSPITAL_COMMUNITY): Payer: Self-pay

## 2012-11-24 MED ORDER — AGALSIDASE BETA 5 MG IV SOLR
115.0000 mg | INTRAVENOUS | Status: DC
  Administered 2012-11-24: 115 mg via INTRAVENOUS
  Filled 2012-11-24: qty 23

## 2012-11-24 MED ORDER — SODIUM CHLORIDE 0.9 % IV SOLN
INTRAVENOUS | Status: DC
  Administered 2012-11-24: 08:00:00 via INTRAVENOUS

## 2012-12-07 ENCOUNTER — Encounter (HOSPITAL_COMMUNITY): Admission: RE | Admit: 2012-12-07 | Payer: Medicaid Other | Source: Ambulatory Visit

## 2012-12-11 ENCOUNTER — Other Ambulatory Visit (HOSPITAL_COMMUNITY): Payer: 59

## 2012-12-14 ENCOUNTER — Encounter (HOSPITAL_COMMUNITY): Payer: Self-pay

## 2012-12-14 ENCOUNTER — Encounter (HOSPITAL_COMMUNITY)
Admission: RE | Admit: 2012-12-14 | Discharge: 2012-12-14 | Disposition: A | Discharge status: Home / Self Care | Payer: Medicaid Other | Source: Ambulatory Visit | Attending: Nephrology | Admitting: Nephrology

## 2012-12-14 ENCOUNTER — Ambulatory Visit: Payer: 59 | Admitting: Internal Medicine

## 2012-12-14 DIAGNOSIS — E756 Lipid storage disorder, unspecified: Secondary | ICD-10-CM | POA: Insufficient documentation

## 2012-12-14 LAB — URINALYSIS W MICROSCOPIC + REFLEX CULTURE
Bilirubin Urine: NEGATIVE
Glucose, UA: NEGATIVE mg/dL
Hgb urine dipstick: NEGATIVE
Ketones, ur: NEGATIVE mg/dL
Leukocytes, UA: NEGATIVE
Nitrite: NEGATIVE
Protein, ur: 30 mg/dL — AB
Specific Gravity, Urine: 1.018 (ref 1.005–1.030)
Urobilinogen, UA: 0.2 mg/dL (ref 0.0–1.0)
pH: 5 (ref 5.0–8.0)

## 2012-12-14 LAB — COMPREHENSIVE METABOLIC PANEL
ALT: 30 U/L (ref 0–53)
AST: 31 U/L (ref 0–37)
Albumin: 3.2 g/dL — ABNORMAL LOW (ref 3.5–5.2)
Alkaline Phosphatase: 58 U/L (ref 39–117)
BUN: 17 mg/dL (ref 6–23)
CO2: 24 mEq/L (ref 19–32)
Calcium: 9 mg/dL (ref 8.4–10.5)
Chloride: 104 mEq/L (ref 96–112)
Creatinine, Ser: 1.32 mg/dL (ref 0.50–1.35)
GFR calc Af Amer: 71 mL/min — ABNORMAL LOW (ref >90)
GFR calc non Af Amer: 61 mL/min — ABNORMAL LOW (ref >90)
Glucose, Bld: 149 mg/dL — ABNORMAL HIGH (ref 70–99)
Potassium: 4.3 mEq/L (ref 3.5–5.1)
Sodium: 140 mEq/L (ref 135–145)
Total Bilirubin: 0.3 mg/dL (ref 0.3–1.2)
Total Protein: 7.6 g/dL (ref 6.0–8.3)

## 2012-12-14 LAB — PHOSPHORUS: Phosphorus: 3.7 mg/dL (ref 2.3–4.6)

## 2012-12-14 MED ORDER — SODIUM CHLORIDE 0.9 % IV SOLN
115.0000 mg | INTRAVENOUS | Status: DC
  Administered 2012-12-14: 115 mg via INTRAVENOUS
  Filled 2012-12-14: qty 23

## 2012-12-14 MED ORDER — SODIUM CHLORIDE 0.9 % IV SOLN
INTRAVENOUS | Status: DC
  Administered 2012-12-14: 20 mL/h via INTRAVENOUS

## 2012-12-21 ENCOUNTER — Encounter (HOSPITAL_COMMUNITY): Payer: Medicaid Other

## 2012-12-22 ENCOUNTER — Encounter (HOSPITAL_COMMUNITY)
Admission: RE | Admit: 2012-12-22 | Payer: Medicaid Other | Source: Ambulatory Visit | Attending: Nephrology | Admitting: Nephrology

## 2012-12-28 ENCOUNTER — Other Ambulatory Visit (HOSPITAL_COMMUNITY): Payer: Self-pay | Admitting: Nephrology

## 2012-12-28 ENCOUNTER — Encounter (HOSPITAL_COMMUNITY)
Admission: RE | Admit: 2012-12-28 | Discharge: 2012-12-28 | Disposition: A | Discharge status: Home / Self Care | Payer: Medicaid Other | Source: Ambulatory Visit | Attending: Nephrology | Admitting: Nephrology

## 2012-12-28 ENCOUNTER — Encounter (HOSPITAL_COMMUNITY): Payer: Self-pay

## 2012-12-28 LAB — CBC WITH DIFFERENTIAL/PLATELET
Basophils Absolute: 0 10*3/uL (ref 0.0–0.1)
Basophils Relative: 1 % (ref 0–1)
Eosinophils Absolute: 0.3 10*3/uL (ref 0.0–0.7)
Eosinophils Relative: 5 % (ref 0–5)
HCT: 41.4 % (ref 39.0–52.0)
Hemoglobin: 14.1 g/dL (ref 13.0–17.0)
Lymphocytes Relative: 43 % (ref 12–46)
Lymphs Abs: 2.8 10*3/uL (ref 0.7–4.0)
MCH: 30.3 pg (ref 26.0–34.0)
MCHC: 34.1 g/dL (ref 30.0–36.0)
MCV: 88.8 fL (ref 78.0–100.0)
Monocytes Absolute: 0.6 10*3/uL (ref 0.1–1.0)
Monocytes Relative: 9 % (ref 3–12)
Neutro Abs: 2.8 10*3/uL (ref 1.7–7.7)
Neutrophils Relative %: 43 % (ref 43–77)
Platelets: 187 10*3/uL (ref 150–400)
RBC: 4.66 MIL/uL (ref 4.22–5.81)
RDW: 12.8 % (ref 11.5–15.5)
WBC: 6.6 10*3/uL (ref 4.0–10.5)

## 2012-12-28 LAB — COMPREHENSIVE METABOLIC PANEL
ALT: 24 U/L (ref 0–53)
AST: 25 U/L (ref 0–37)
Albumin: 3.1 g/dL — ABNORMAL LOW (ref 3.5–5.2)
Alkaline Phosphatase: 66 U/L (ref 39–117)
BUN: 16 mg/dL (ref 6–23)
CO2: 24 mEq/L (ref 19–32)
Calcium: 9.1 mg/dL (ref 8.4–10.5)
Chloride: 107 mEq/L (ref 96–112)
Creatinine, Ser: 1.28 mg/dL (ref 0.50–1.35)
GFR calc Af Amer: 73 mL/min — ABNORMAL LOW (ref >90)
GFR calc non Af Amer: 63 mL/min — ABNORMAL LOW (ref >90)
Glucose, Bld: 143 mg/dL — ABNORMAL HIGH (ref 70–99)
Potassium: 3.9 mEq/L (ref 3.5–5.1)
Sodium: 142 mEq/L (ref 135–145)
Total Bilirubin: 0.3 mg/dL (ref 0.3–1.2)
Total Protein: 7.1 g/dL (ref 6.0–8.3)

## 2012-12-28 LAB — LIPID PANEL
Cholesterol: 222 mg/dL — ABNORMAL HIGH (ref 0–200)
HDL: 39 mg/dL — ABNORMAL LOW (ref >39)
LDL Cholesterol: 136 mg/dL — ABNORMAL HIGH (ref 0–99)
Total CHOL/HDL Ratio: 5.7 RATIO
Triglycerides: 234 mg/dL — ABNORMAL HIGH (ref <150)
VLDL: 47 mg/dL — ABNORMAL HIGH (ref 0–40)

## 2012-12-28 LAB — IRON AND TIBC
Iron: 55 ug/dL (ref 42–135)
Saturation Ratios: 23 % (ref 20–55)
TIBC: 239 ug/dL (ref 215–435)
UIBC: 184 ug/dL (ref 125–400)

## 2012-12-28 LAB — URINE MICROSCOPIC-ADD ON

## 2012-12-28 LAB — URINALYSIS, ROUTINE W REFLEX MICROSCOPIC
Bilirubin Urine: NEGATIVE
Glucose, UA: NEGATIVE mg/dL
Hgb urine dipstick: NEGATIVE
Ketones, ur: NEGATIVE mg/dL
Leukocytes, UA: NEGATIVE
Nitrite: NEGATIVE
Protein, ur: 100 mg/dL — AB
Specific Gravity, Urine: 1.018 (ref 1.005–1.030)
Urobilinogen, UA: 0.2 mg/dL (ref 0.0–1.0)
pH: 5 (ref 5.0–8.0)

## 2012-12-28 LAB — FERRITIN: Ferritin: 300 ng/mL (ref 22–322)

## 2012-12-28 LAB — PHOSPHORUS: Phosphorus: 4.6 mg/dL (ref 2.3–4.6)

## 2012-12-28 MED ORDER — SODIUM CHLORIDE 0.9 % IV SOLN
Freq: Once | INTRAVENOUS | Status: AC
  Administered 2012-12-28: 09:00:00 via INTRAVENOUS

## 2012-12-28 MED ORDER — SODIUM CHLORIDE 0.9 % IV SOLN
115.0000 mg | INTRAVENOUS | Status: DC
  Administered 2012-12-28: 115 mg via INTRAVENOUS
  Filled 2012-12-28: qty 23

## 2012-12-28 NOTE — Progress Notes (Signed)
Pt here today for his FABRAZYME infusion. He states he took his TYLENOL and CLARITIN at home prior to arrival.

## 2013-01-05 ENCOUNTER — Encounter (HOSPITAL_COMMUNITY): Payer: Medicare Other

## 2013-01-11 ENCOUNTER — Encounter (HOSPITAL_COMMUNITY)
Admission: RE | Admit: 2013-01-11 | Discharge: 2013-01-11 | Disposition: A | Discharge status: Home / Self Care | Payer: Medicaid Other | Source: Ambulatory Visit | Attending: Nephrology | Admitting: Nephrology

## 2013-01-11 ENCOUNTER — Encounter (HOSPITAL_COMMUNITY): Payer: Self-pay

## 2013-01-11 DIAGNOSIS — E756 Lipid storage disorder, unspecified: Secondary | ICD-10-CM | POA: Insufficient documentation

## 2013-01-11 MED ORDER — SODIUM CHLORIDE 0.9 % IV SOLN
INTRAVENOUS | Status: DC
  Administered 2013-01-11: 20 mL/h via INTRAVENOUS

## 2013-01-11 MED ORDER — SODIUM CHLORIDE 0.9 % IV SOLN
115.0000 mg | INTRAVENOUS | Status: DC
  Administered 2013-01-11: 115 mg via INTRAVENOUS
  Filled 2013-01-11: qty 23

## 2013-01-25 ENCOUNTER — Encounter (HOSPITAL_COMMUNITY)
Admission: RE | Admit: 2013-01-25 | Discharge: 2013-01-25 | Disposition: A | Discharge status: Home / Self Care | Payer: Medicaid Other | Source: Ambulatory Visit | Attending: Nephrology | Admitting: Nephrology

## 2013-01-25 ENCOUNTER — Encounter (HOSPITAL_COMMUNITY): Payer: Self-pay

## 2013-01-25 LAB — URINALYSIS, ROUTINE W REFLEX MICROSCOPIC
Bilirubin Urine: NEGATIVE
Glucose, UA: NEGATIVE mg/dL
Hgb urine dipstick: NEGATIVE
Ketones, ur: NEGATIVE mg/dL
Leukocytes, UA: NEGATIVE
Nitrite: NEGATIVE
Protein, ur: 100 mg/dL — AB
Specific Gravity, Urine: 1.018 (ref 1.005–1.030)
Urobilinogen, UA: 0.2 mg/dL (ref 0.0–1.0)
pH: 5 (ref 5.0–8.0)

## 2013-01-25 LAB — COMPREHENSIVE METABOLIC PANEL
ALT: 25 U/L (ref 0–53)
AST: 30 U/L (ref 0–37)
Albumin: 2.9 g/dL — ABNORMAL LOW (ref 3.5–5.2)
Alkaline Phosphatase: 63 U/L (ref 39–117)
BUN: 12 mg/dL (ref 6–23)
CO2: 25 mEq/L (ref 19–32)
Calcium: 8.8 mg/dL (ref 8.4–10.5)
Chloride: 105 mEq/L (ref 96–112)
Creatinine, Ser: 1.33 mg/dL (ref 0.50–1.35)
GFR calc Af Amer: 70 mL/min — ABNORMAL LOW (ref >90)
GFR calc non Af Amer: 60 mL/min — ABNORMAL LOW (ref >90)
Glucose, Bld: 189 mg/dL — ABNORMAL HIGH (ref 70–99)
Potassium: 3.9 mEq/L (ref 3.5–5.1)
Sodium: 140 mEq/L (ref 135–145)
Total Bilirubin: 0.2 mg/dL — ABNORMAL LOW (ref 0.3–1.2)
Total Protein: 7 g/dL (ref 6.0–8.3)

## 2013-01-25 LAB — URINE MICROSCOPIC-ADD ON

## 2013-01-25 MED ORDER — SODIUM CHLORIDE 0.9 % IV SOLN
115.0000 mg | INTRAVENOUS | Status: DC
  Administered 2013-01-25: 115 mg via INTRAVENOUS
  Filled 2013-01-25: qty 23

## 2013-01-25 MED ORDER — SODIUM CHLORIDE 0.9 % IV SOLN
INTRAVENOUS | Status: DC
  Administered 2013-01-25: 250 mL via INTRAVENOUS

## 2013-01-29 ENCOUNTER — Telehealth: Payer: Self-pay | Admitting: Internal Medicine

## 2013-01-29 NOTE — Telephone Encounter (Signed)
Dr. Annamaria Boots has not refilled these for pt and these needs to come from his PCP. I called and made [facility] aware of this. Nothing further needed

## 2013-02-08 ENCOUNTER — Encounter (HOSPITAL_COMMUNITY): Admission: RE | Admit: 2013-02-08 | Payer: Medicare Other | Source: Ambulatory Visit

## 2013-02-13 ENCOUNTER — Encounter (HOSPITAL_COMMUNITY): Payer: Self-pay

## 2013-02-13 ENCOUNTER — Ambulatory Visit (HOSPITAL_COMMUNITY)
Admission: RE | Admit: 2013-02-13 | Discharge: 2013-02-13 | Disposition: A | Discharge status: Home / Self Care | Payer: Medicaid Other | Source: Ambulatory Visit | Attending: Nephrology | Admitting: Nephrology

## 2013-02-13 DIAGNOSIS — E756 Lipid storage disorder, unspecified: Secondary | ICD-10-CM | POA: Insufficient documentation

## 2013-02-13 LAB — COMPREHENSIVE METABOLIC PANEL
ALT: 21 U/L (ref 0–53)
AST: 22 U/L (ref 0–37)
Albumin: 3 g/dL — ABNORMAL LOW (ref 3.5–5.2)
Alkaline Phosphatase: 65 U/L (ref 39–117)
BUN: 18 mg/dL (ref 6–23)
CO2: 23 mEq/L (ref 19–32)
Calcium: 9 mg/dL (ref 8.4–10.5)
Chloride: 107 mEq/L (ref 96–112)
Creatinine, Ser: 1.33 mg/dL (ref 0.50–1.35)
GFR calc Af Amer: 70 mL/min — ABNORMAL LOW (ref >90)
GFR calc non Af Amer: 60 mL/min — ABNORMAL LOW (ref >90)
Glucose, Bld: 155 mg/dL — ABNORMAL HIGH (ref 70–99)
Potassium: 3.9 mEq/L (ref 3.5–5.1)
Sodium: 140 mEq/L (ref 135–145)
Total Bilirubin: 0.2 mg/dL — ABNORMAL LOW (ref 0.3–1.2)
Total Protein: 7.4 g/dL (ref 6.0–8.3)

## 2013-02-13 LAB — URINE MICROSCOPIC-ADD ON

## 2013-02-13 LAB — URINALYSIS, ROUTINE W REFLEX MICROSCOPIC
Bilirubin Urine: NEGATIVE
Glucose, UA: NEGATIVE mg/dL
Hgb urine dipstick: NEGATIVE
Ketones, ur: NEGATIVE mg/dL
Leukocytes, UA: NEGATIVE
Nitrite: NEGATIVE
Protein, ur: 30 mg/dL — AB
Specific Gravity, Urine: 1.019 (ref 1.005–1.030)
Urobilinogen, UA: 0.2 mg/dL (ref 0.0–1.0)
pH: 5 (ref 5.0–8.0)

## 2013-02-13 MED ORDER — SODIUM CHLORIDE 0.9 % IV SOLN
INTRAVENOUS | Status: DC
  Administered 2013-02-13: 10:00:00 via INTRAVENOUS

## 2013-02-13 MED ORDER — SODIUM CHLORIDE 0.9 % IV SOLN
115.0000 mg | INTRAVENOUS | Status: DC
  Administered 2013-02-13: 115 mg via INTRAVENOUS
  Filled 2013-02-13: qty 23

## 2013-02-22 ENCOUNTER — Encounter (HOSPITAL_COMMUNITY): Payer: Medicare Other

## 2013-03-01 ENCOUNTER — Encounter (HOSPITAL_COMMUNITY): Payer: Self-pay

## 2013-03-01 ENCOUNTER — Ambulatory Visit (HOSPITAL_COMMUNITY)
Admission: RE | Admit: 2013-03-01 | Discharge: 2013-03-01 | Disposition: A | Discharge status: Home / Self Care | Payer: Medicaid Other | Source: Ambulatory Visit | Attending: Nephrology | Admitting: Nephrology

## 2013-03-01 DIAGNOSIS — E756 Lipid storage disorder, unspecified: Secondary | ICD-10-CM | POA: Insufficient documentation

## 2013-03-01 MED ORDER — SODIUM CHLORIDE 0.9 % IV SOLN
INTRAVENOUS | Status: DC
  Administered 2013-03-01: 10:00:00 via INTRAVENOUS

## 2013-03-01 MED ORDER — AGALSIDASE BETA 5 MG IV SOLR
115.0000 mg | INTRAVENOUS | Status: DC
  Administered 2013-03-01: 115 mg via INTRAVENOUS
  Filled 2013-03-01: qty 23

## 2013-03-08 ENCOUNTER — Encounter (HOSPITAL_COMMUNITY): Payer: Medicare Other

## 2013-03-13 ENCOUNTER — Emergency Department (HOSPITAL_COMMUNITY)
Admission: EM | Admit: 2013-03-13 | Discharge: 2013-03-13 | Disposition: A | Discharge status: Home / Self Care | Payer: Medicare Other | Attending: Emergency Medicine | Admitting: Emergency Medicine

## 2013-03-13 ENCOUNTER — Encounter (HOSPITAL_COMMUNITY): Payer: Self-pay | Admitting: Emergency Medicine

## 2013-03-13 DIAGNOSIS — M25569 Pain in unspecified knee: Secondary | ICD-10-CM | POA: Insufficient documentation

## 2013-03-13 DIAGNOSIS — Z950 Presence of cardiac pacemaker: Secondary | ICD-10-CM | POA: Diagnosis not present

## 2013-03-13 DIAGNOSIS — N189 Chronic kidney disease, unspecified: Secondary | ICD-10-CM | POA: Diagnosis not present

## 2013-03-13 DIAGNOSIS — Z872 Personal history of diseases of the skin and subcutaneous tissue: Secondary | ICD-10-CM | POA: Insufficient documentation

## 2013-03-13 DIAGNOSIS — J45909 Unspecified asthma, uncomplicated: Secondary | ICD-10-CM | POA: Insufficient documentation

## 2013-03-13 DIAGNOSIS — R0789 Other chest pain: Secondary | ICD-10-CM | POA: Insufficient documentation

## 2013-03-13 DIAGNOSIS — Z79899 Other long term (current) drug therapy: Secondary | ICD-10-CM | POA: Insufficient documentation

## 2013-03-13 DIAGNOSIS — E119 Type 2 diabetes mellitus without complications: Secondary | ICD-10-CM | POA: Insufficient documentation

## 2013-03-13 DIAGNOSIS — R0602 Shortness of breath: Secondary | ICD-10-CM | POA: Insufficient documentation

## 2013-03-13 DIAGNOSIS — M109 Gout, unspecified: Secondary | ICD-10-CM | POA: Insufficient documentation

## 2013-03-13 DIAGNOSIS — I129 Hypertensive chronic kidney disease with stage 1 through stage 4 chronic kidney disease, or unspecified chronic kidney disease: Secondary | ICD-10-CM | POA: Insufficient documentation

## 2013-03-13 DIAGNOSIS — IMO0002 Reserved for concepts with insufficient information to code with codable children: Secondary | ICD-10-CM | POA: Insufficient documentation

## 2013-03-13 DIAGNOSIS — I5030 Unspecified diastolic (congestive) heart failure: Secondary | ICD-10-CM | POA: Insufficient documentation

## 2013-03-13 DIAGNOSIS — M25561 Pain in right knee: Secondary | ICD-10-CM

## 2013-03-13 LAB — CBC WITH DIFFERENTIAL/PLATELET
Basophils Absolute: 0 10*3/uL (ref 0.0–0.1)
Basophils Relative: 0 % (ref 0–1)
Eosinophils Absolute: 0 10*3/uL (ref 0.0–0.7)
Eosinophils Relative: 1 % (ref 0–5)
HCT: 42.6 % (ref 39.0–52.0)
Hemoglobin: 14.6 g/dL (ref 13.0–17.0)
Lymphocytes Relative: 17 % (ref 12–46)
Lymphs Abs: 1.5 10*3/uL (ref 0.7–4.0)
MCH: 30.5 pg (ref 26.0–34.0)
MCHC: 34.3 g/dL (ref 30.0–36.0)
MCV: 89.1 fL (ref 78.0–100.0)
Monocytes Absolute: 1.1 10*3/uL — ABNORMAL HIGH (ref 0.1–1.0)
Monocytes Relative: 13 % — ABNORMAL HIGH (ref 3–12)
Neutro Abs: 6.2 10*3/uL (ref 1.7–7.7)
Neutrophils Relative %: 70 % (ref 43–77)
Platelets: 168 10*3/uL (ref 150–400)
RBC: 4.78 MIL/uL (ref 4.22–5.81)
RDW: 12.4 % (ref 11.5–15.5)
WBC: 8.8 10*3/uL (ref 4.0–10.5)

## 2013-03-13 LAB — COMPREHENSIVE METABOLIC PANEL
ALT: 21 U/L (ref 0–53)
AST: 21 U/L (ref 0–37)
Albumin: 3 g/dL — ABNORMAL LOW (ref 3.5–5.2)
Alkaline Phosphatase: 59 U/L (ref 39–117)
BUN: 19 mg/dL (ref 6–23)
CO2: 25 mEq/L (ref 19–32)
Calcium: 9.4 mg/dL (ref 8.4–10.5)
Chloride: 104 mEq/L (ref 96–112)
Creatinine, Ser: 1.32 mg/dL (ref 0.50–1.35)
GFR calc Af Amer: 70 mL/min — ABNORMAL LOW (ref >90)
GFR calc non Af Amer: 61 mL/min — ABNORMAL LOW (ref >90)
Glucose, Bld: 179 mg/dL — ABNORMAL HIGH (ref 70–99)
Potassium: 4.1 mEq/L (ref 3.5–5.1)
Sodium: 139 mEq/L (ref 135–145)
Total Bilirubin: 0.4 mg/dL (ref 0.3–1.2)
Total Protein: 7.5 g/dL (ref 6.0–8.3)

## 2013-03-13 LAB — PRO B NATRIURETIC PEPTIDE: Pro B Natriuretic peptide (BNP): 721 pg/mL — ABNORMAL HIGH (ref 0–125)

## 2013-03-13 LAB — TROPONIN I: Troponin I: <0.3 ng/mL (ref <0.30)

## 2013-03-13 MED ORDER — OXYCODONE-ACETAMINOPHEN 5-325 MG PO TABS: 1.0000 | ORAL_TABLET | ORAL | Status: DC | PRN

## 2013-03-13 MED ORDER — NAPROXEN 500 MG PO TABS
250.0000 mg | ORAL_TABLET | Freq: Once | ORAL | Status: AC
  Administered 2013-03-13: 250 mg via ORAL
  Filled 2013-03-13: qty 1

## 2013-03-13 MED ORDER — HYDROMORPHONE HCL PF 1 MG/ML IJ SOLN
1.0000 mg | Freq: Once | INTRAMUSCULAR | Status: AC
  Administered 2013-03-13: 1 mg via INTRAMUSCULAR
  Filled 2013-03-13: qty 1

## 2013-03-13 MED ORDER — NAPROXEN SODIUM 220 MG PO TABS: 220.0000 mg | ORAL_TABLET | Freq: Two times a day (BID) | ORAL | Status: DC

## 2013-03-13 MED ORDER — HYDROMORPHONE HCL PF 1 MG/ML IJ SOLN: 1.0000 mg | Freq: Once | INTRAMUSCULAR | Status: DC

## 2013-03-13 MED ORDER — OXYCODONE-ACETAMINOPHEN 5-325 MG PO TABS
1.0000 | ORAL_TABLET | Freq: Once | ORAL | Status: AC
  Administered 2013-03-13: 1 via ORAL
  Filled 2013-03-13: qty 1

## 2013-03-13 NOTE — ED Provider Notes (Signed)
CSN: [id number]     Arrival date & time 03/13/13  1130 History   First MD Initiated Contact with Patient 03/13/13 1132     Chief Complaint  Patient presents with  . Shortness of Breath  . Leg Swelling   (Consider location/radiation/quality/duration/timing/severity/associated sxs/prior Treatment) HPI Comments: 52 year old male with a history of gout presenting with right knee pain. He reports walking on it a lot yesterday without difficulty. He woke up overnight and stepped out of bed to use the restroom. This time he noticed severe right knee pain which caused him to fall to the floor. At that time he became very anxious about his knee and felt some chest tightness and shortness of breath. The symptoms quickly resolved as he calmed down. He has not had chest pain or shortness of breath above his baseline since that time.    Patient is a 52 y.o. male presenting with knee pain.  Knee Pain Location:  Leg Time since incident: started this morning. Injury: no   Leg pain location: right knee. Pain details:    Quality:  Throbbing   Radiates to:  Does not radiate   Severity:  Moderate   Onset quality: awoke with pain.   Timing:  Constant   Progression:  Unchanged Chronicity:  Recurrent Relieved by:  Nothing Worsened by:  Nothing tried Associated symptoms: swelling   Associated symptoms: no fever     Past Medical History  Diagnosis Date  . Second degree Mobitz II AV block     with syncope, s/p PPM  . Hypertension   . Bifascicular block   . Diabetes mellitus   . Pacemaker     02/12/11  . Lymphedema of lower extremity LEFT  >  RIGHT    USES PUMP AT HOME-- DOES NOT WEAR HOSE  . Short of breath on exertion   . History of cellulitis of skin with lymphangitis LEFT LEG  . Diastolic heart failure secondary to hypertrophic cardiomyopathy CARDIOLOGIST-- DR Daneen Schick  . Fabry's disease RENAL AND CARDIAC INVOVLEMENT    FOLLOWED DR COLADOANTO  . Chronic renal insufficiency   . Gout,  arthritis 11/2011    bil feet. right worse  . Seasonal asthma NO INHALERS   Past Surgical History  Procedure Laterality Date  . Pacemaker insertion  02/12/11    SJM implanted by Dr Rayann Heman for Mobitz II second degree AV block and syncope  . Cardiac catheterization  03-12-2011  DR Daneen Schick    HYPERTROPHIC CARDIOMYOPATHY WITH LV CAVITY  APPEARANCE CONSISTANT WITH SIGNIFICANT APICAL HYPERTROPHY/ NORMAL LVSF / EF 55%/ EVIDENCE OF DIASTOLIC DYSFUNCTION WITH EDP OF 23-61mmHg AFTER A-WAVE/ NORMAL CORONARY ARTERIES  . Umbilical hernia repair  03/22/2012    Procedure: HERNIA REPAIR UMBILICAL ADULT;  Surgeon: Leighton Ruff, MD;  Location: [facility];  Service: General;  Laterality: N/A;  Umbilical Hernia Repair   . Hernia repair  0000000    Umbilical Hernia Repair   No family history on file. History  Substance Use Topics  . Smoking status: Never Smoker   . Smokeless tobacco: Never Used  . Alcohol Use: Yes     Comment: OCCASIONAL    Review of Systems  Constitutional: Negative for fever.  HENT: Negative for congestion.   Gastrointestinal: Negative for nausea, vomiting, abdominal pain and diarrhea.  All other systems reviewed and are negative.    Allergies  Shellfish allergy  Home Medications   Current Outpatient Rx  Name  Route  Sig  Dispense  Refill  . acetaminophen (  TYLENOL) 500 MG tablet   Oral   Take 500 mg by mouth once. AS premed for Fabrazyme         . Agalsidase beta (FABRAZYME IV)   Intravenous   Inject into the vein every 14 (fourteen) days.         [name] Kitchen albuterol (PROVENTIL HFA;VENTOLIN HFA) 108 (90 BASE) MCG/ACT inhaler   Inhalation   Inhale 2 puffs into the lungs every 6 (six) hours as needed for wheezing or shortness of breath.   1 Inhaler   prn   . allopurinol (ZYLOPRIM) 100 MG tablet   Oral   Take 200 mg by mouth daily. Take 2 tablets Daily         . aspirin 81 MG tablet   Oral   Take 81 mg by mouth daily.          . Azelastine-Fluticasone (DYMISTA)  137-50 MCG/ACT SUSP   Nasal   Place 1-2 puffs into the nose at bedtime.   1 Bottle   0   . colchicine (COLCRYS) 0.6 MG tablet   Oral   Take 0.6 mg by mouth daily.         [name] Kitchen eplerenone (INSPRA) 50 MG tablet   Oral   Take 50 mg by mouth daily.         . fexofenadine (ALLEGRA) 60 MG tablet   Oral   Take 60 mg by mouth once.         . glyBURIDE (DIABETA) 5 MG tablet   Oral   Take 5 mg by mouth daily with breakfast.         . losartan (COZAAR) 25 MG tablet   Oral   Take 25 mg by mouth daily.          [name] Kitchen losartan-hydrochlorothiazide (HYZAAR) 50-12.5 MG per tablet   Oral   Take 1 tablet by mouth daily.         . mometasone-formoterol (DULERA) 100-5 MCG/ACT AERO      2 puffs then rinse mouth twice daily- maintenance inhaler   1 Inhaler   prn   . mometasone-formoterol (DULERA) 100-5 MCG/ACT AERO   Inhalation   Inhale 2 puffs into the lungs 2 (two) times daily. Rinse mouth   1 Inhaler   prn    BP 133/70  Pulse 63  Temp(Src) 98.1 F (36.7 C) (Oral)  Resp 16  SpO2 97% Physical Exam  Nursing note and vitals reviewed. Constitutional: He is oriented to person, place, and time. He appears well-developed and well-nourished. No distress.  HENT:  Head: Normocephalic and atraumatic.  Mouth/Throat: Oropharynx is clear and moist.  Eyes: Conjunctivae are normal. Pupils are equal, round, and reactive to light. No scleral icterus.  Neck: Neck supple.  Cardiovascular: Normal rate, regular rhythm, normal heart sounds and intact distal pulses.   No murmur heard. Pulmonary/Chest: Effort normal and breath sounds normal. No stridor. No respiratory distress. He has no wheezes. He has no rales.  Abdominal: Soft. He exhibits no distension. There is no tenderness.  Musculoskeletal: He exhibits edema (Significant bilateral lower extremity, grossly equivalent).       Right knee: He exhibits swelling and effusion. He exhibits normal range of motion (pain with range of motion), no  deformity and no erythema. Tenderness found.  Neurological: He is alert and oriented to person, place, and time.  Skin: Skin is warm and dry. No rash noted.  Psychiatric: He has a normal mood and affect. His behavior is normal.  ED Course  Procedures (including critical care time) Labs Review Labs Reviewed  CBC WITH DIFFERENTIAL - Abnormal; Notable for the following:    Monocytes Relative 13 (*)    Monocytes Absolute 1.1 (*)    All other components within normal limits  COMPREHENSIVE METABOLIC PANEL - Abnormal; Notable for the following:    Glucose, Bld 179 (*)    Albumin 3.0 (*)    GFR calc non Af Amer 61 (*)    GFR calc Af Amer 70 (*)    All other components within normal limits  PRO B NATRIURETIC PEPTIDE - Abnormal; Notable for the following:    Pro B Natriuretic peptide (BNP) 721.0 (*)    All other components within normal limits  TROPONIN I   Imaging Review No results found.  EKG- Atrial paced rhythm, HR 60, LAD, LVH, RBBB, T wave inversions, compared to most recent, a pacing has replaced v pacing, compared to last sinus rhythm EKG, T wave inversions consistent.    MDM   1. Knee pain, right    52 yo male presenting with right knee pain and swelling.  He reports no injuries, but did walk on it a lot yesterday.  He has had similar problems in the past.  He has a history of gout.  He reports waking up this morning and stepping on his leg, which was when he first noticed the pain.  It took his breath away, made him anxious, and gave him a short episode of chest tightness.  Those symptoms quickly resolved (I do not think his anxiety, chest pain, and SOB represent ACS or PE.) Now he only complains of knee pain.  His knee is mildly swollen with good distal perfusion, no redness, minimal warmth, full ROM (but with pain).  I think this is likely a gout flare up.  Exam is not consistent with septic arthritis.  He has a history of Fabry's disease and has lower extremity swelling which  is at or slightly better than baseline today.  He has some mild renal dysfunction, so will treat use low dose NSAIDs and Percocet to treat pain.  He will follow up with his pCP tomorrow.  He was advised to watch out for signs of infection.        Houston Siren, MD 03/13/13 1535

## 2013-03-13 NOTE — ED Notes (Signed)
This am awaken with leg swelling bil from knees down to feet. Sob, states that he is unsure what happened. Alert talking in full sentences.

## 2013-03-13 NOTE — ED Notes (Signed)
Attempted IV start and was unsuccessful.

## 2013-03-13 NOTE — ED Notes (Signed)
Bed: WA05 Expected date:  Expected time:  Means of arrival:  Comments: 

## 2013-03-14 DIAGNOSIS — M25469 Effusion, unspecified knee: Secondary | ICD-10-CM | POA: Diagnosis not present

## 2013-03-16 ENCOUNTER — Other Ambulatory Visit (HOSPITAL_COMMUNITY): Payer: Self-pay | Admitting: Nephrology

## 2013-03-16 ENCOUNTER — Encounter (HOSPITAL_COMMUNITY)
Admission: RE | Admit: 2013-03-16 | Discharge: 2013-03-16 | Disposition: A | Discharge status: Home / Self Care | Payer: Medicare Other | Source: Ambulatory Visit | Attending: Nephrology | Admitting: Nephrology

## 2013-03-16 ENCOUNTER — Encounter (HOSPITAL_COMMUNITY): Payer: Self-pay

## 2013-03-16 DIAGNOSIS — E756 Lipid storage disorder, unspecified: Secondary | ICD-10-CM | POA: Diagnosis not present

## 2013-03-16 LAB — COMPREHENSIVE METABOLIC PANEL
ALT: 18 U/L (ref 0–53)
AST: 21 U/L (ref 0–37)
Albumin: 2.9 g/dL — ABNORMAL LOW (ref 3.5–5.2)
Alkaline Phosphatase: 60 U/L (ref 39–117)
BUN: 25 mg/dL — ABNORMAL HIGH (ref 6–23)
CO2: 24 mEq/L (ref 19–32)
Calcium: 9 mg/dL (ref 8.4–10.5)
Chloride: 105 mEq/L (ref 96–112)
Creatinine, Ser: 1.54 mg/dL — ABNORMAL HIGH (ref 0.50–1.35)
GFR calc Af Amer: 58 mL/min — ABNORMAL LOW (ref >90)
GFR calc non Af Amer: 50 mL/min — ABNORMAL LOW (ref >90)
Glucose, Bld: 136 mg/dL — ABNORMAL HIGH (ref 70–99)
Potassium: 4.1 mEq/L (ref 3.5–5.1)
Sodium: 141 mEq/L (ref 135–145)
Total Bilirubin: 0.2 mg/dL — ABNORMAL LOW (ref 0.3–1.2)
Total Protein: 7.9 g/dL (ref 6.0–8.3)

## 2013-03-16 LAB — URINALYSIS, ROUTINE W REFLEX MICROSCOPIC
Bilirubin Urine: NEGATIVE
Glucose, UA: NEGATIVE mg/dL
Hgb urine dipstick: NEGATIVE
Ketones, ur: NEGATIVE mg/dL
Leukocytes, UA: NEGATIVE
Nitrite: NEGATIVE
Protein, ur: NEGATIVE mg/dL
Specific Gravity, Urine: 1.02 (ref 1.005–1.030)
Urobilinogen, UA: 0.2 mg/dL (ref 0.0–1.0)
pH: 5 (ref 5.0–8.0)

## 2013-03-16 LAB — PHOSPHORUS: Phosphorus: 3.9 mg/dL (ref 2.3–4.6)

## 2013-03-16 MED ORDER — SODIUM CHLORIDE 0.9 % IV SOLN
INTRAVENOUS | Status: DC
  Administered 2013-03-16: 08:00:00 via INTRAVENOUS

## 2013-03-16 MED ORDER — SODIUM CHLORIDE 0.9 % IV SOLN
115.0000 mg | INTRAVENOUS | Status: AC
  Administered 2013-03-16: 115 mg via INTRAVENOUS
  Filled 2013-03-16: qty 23

## 2013-03-19 DIAGNOSIS — M25469 Effusion, unspecified knee: Secondary | ICD-10-CM | POA: Diagnosis not present

## 2013-03-19 DIAGNOSIS — M25569 Pain in unspecified knee: Secondary | ICD-10-CM | POA: Diagnosis not present

## 2013-03-21 ENCOUNTER — Encounter: Payer: Self-pay | Admitting: *Deleted

## 2013-03-22 ENCOUNTER — Encounter (HOSPITAL_COMMUNITY): Payer: Medicare Other

## 2013-03-22 DIAGNOSIS — I10 Essential (primary) hypertension: Secondary | ICD-10-CM | POA: Diagnosis not present

## 2013-03-22 DIAGNOSIS — Z23 Encounter for immunization: Secondary | ICD-10-CM | POA: Diagnosis not present

## 2013-03-22 DIAGNOSIS — N2581 Secondary hyperparathyroidism of renal origin: Secondary | ICD-10-CM | POA: Diagnosis not present

## 2013-03-22 DIAGNOSIS — N182 Chronic kidney disease, stage 2 (mild): Secondary | ICD-10-CM | POA: Diagnosis not present

## 2013-03-22 DIAGNOSIS — D509 Iron deficiency anemia, unspecified: Secondary | ICD-10-CM | POA: Diagnosis not present

## 2013-03-24 ENCOUNTER — Encounter: Payer: Self-pay | Admitting: Interventional Cardiology

## 2013-03-27 ENCOUNTER — Encounter: Payer: Self-pay | Admitting: Interventional Cardiology

## 2013-03-27 ENCOUNTER — Ambulatory Visit (INDEPENDENT_AMBULATORY_CARE_PROVIDER_SITE_OTHER): Payer: Medicare Other | Admitting: Interventional Cardiology

## 2013-03-27 VITALS — BP 130/80 | HR 60 | Ht 70.0 in | Wt 265.0 lb

## 2013-03-27 DIAGNOSIS — I480 Paroxysmal atrial fibrillation: Secondary | ICD-10-CM

## 2013-03-27 DIAGNOSIS — Z950 Presence of cardiac pacemaker: Secondary | ICD-10-CM | POA: Diagnosis not present

## 2013-03-27 DIAGNOSIS — I10 Essential (primary) hypertension: Secondary | ICD-10-CM

## 2013-03-27 DIAGNOSIS — I5032 Chronic diastolic (congestive) heart failure: Secondary | ICD-10-CM | POA: Diagnosis not present

## 2013-03-27 DIAGNOSIS — I4891 Unspecified atrial fibrillation: Secondary | ICD-10-CM

## 2013-03-27 DIAGNOSIS — I441 Atrioventricular block, second degree: Secondary | ICD-10-CM

## 2013-03-27 DIAGNOSIS — E756 Lipid storage disorder, unspecified: Secondary | ICD-10-CM

## 2013-03-27 DIAGNOSIS — E7521 Fabry (-Anderson) disease: Secondary | ICD-10-CM

## 2013-03-27 NOTE — Patient Instructions (Signed)
Watch your sodium intake  Try to stay active  You have an appt in our device clinic 04/09/13 @ 12pm  Your physician recommends that you schedule a follow-up appointment in: 1 year with Dr.Smith

## 2013-03-27 NOTE — Progress Notes (Signed)
[name], male   DOB: 10/08/1960, 52 y.o.   MRN: [id number]    [address], [place]  [address] Phone: [phone] Fax:  [phone]  Date:  03/27/2013   ID:  [name], DOB 1960/07/05, MRN [id number]  PCP:  Elizabeth Palau, MD   ASSESSMENT:  1. Chronic diastolic heart failure, functional class III  2. Hypertension, controlled  3. Permanent pacemaker for AV node disease from 28 disease  4. Chronic kidney disease, stage III  PLAN:  1. One-year followup for general cardiology  2. Set up pacemaker clinic followup.   SUBJECTIVE: [name] is a 52 y.o. male who is known to have Fabry's disease with renal and cardiac involvement. Functional status is gradually decreasing. He has exertional dyspnea. There is significant lower extremity peripheral edema that is multifactorial. He denies chest pain. He has not had syncope. He denies palpitations. He has had difficulty obtaining his medications. He is now back on schedule. He follows in renal clinic with [place].   Wt Readings from Last 3 Encounters:  03/27/13 265 lb (120.203 kg)  03/16/13 271 lb 6 oz (123.095 kg)  03/01/13 266 lb (120.657 kg)     Past Medical History  Diagnosis Date  . Second degree Mobitz II AV block     with syncope, s/p PPM  . Hypertension   . Bifascicular block   . Diabetes mellitus   . Pacemaker     02/12/11  . Lymphedema of lower extremity LEFT  >  RIGHT    USES PUMP AT HOME-- DOES NOT WEAR HOSE  . Short of breath on exertion   . History of cellulitis of skin with lymphangitis LEFT LEG  . Diastolic heart failure secondary to hypertrophic cardiomyopathy CARDIOLOGIST-- DR Daneen Schick  . Fabry's disease RENAL AND CARDIAC INVOVLEMENT    FOLLOWED DR COLADOANTO  . Chronic renal insufficiency   . Gout, arthritis 11/2011    bil feet. right worse  . Seasonal asthma NO INHALERS    Current Outpatient Prescriptions  Medication Sig  Dispense Refill  . acetaminophen (TYLENOL) 500 MG tablet Take 500 mg by mouth every 4 (four) hours as needed. AS premed for Fabrazyme      . Agalsidase beta (FABRAZYME IV) Inject 1 application into the vein every 14 (fourteen) days.       [name] Kitchen albuterol (PROVENTIL HFA;VENTOLIN HFA) 108 (90 BASE) MCG/ACT inhaler Inhale 2 puffs into the lungs every 6 (six) hours as needed for wheezing.      [name] Kitchen allopurinol (ZYLOPRIM) 100 MG tablet Take 200 mg by mouth daily. Take 2 tablets Daily      . aspirin 81 MG tablet Take 81 mg by mouth daily.       . colchicine (COLCRYS) 0.6 MG tablet Take 0.6 mg by mouth daily.      [name] Kitchen eplerenone (INSPRA) 50 MG tablet Take 50 mg by mouth daily.      . fexofenadine (ALLEGRA) 60 MG tablet Take 60 mg by mouth once.      . glyBURIDE (DIABETA) 5 MG tablet Take 5 mg by mouth daily with breakfast.      . losartan-hydrochlorothiazide (HYZAAR) 50-12.5 MG per tablet Take 1 tablet by mouth daily.      . mometasone-formoterol (DULERA) 100-5 MCG/ACT AERO Inhale 2 puffs into the lungs 2 (two) times daily.      [name] Kitchen oxyCODONE-acetaminophen (PERCOCET/ROXICET) 5-325 MG per tablet Take 1 tablet by mouth every 4 (four)  hours as needed for pain.  12 tablet  0  . naproxen sodium (ALEVE) 220 MG tablet Take 1 tablet (220 mg total) by mouth 2 (two) times daily with a meal.  14 tablet  0  . [DISCONTINUED] furosemide (LASIX) 20 MG tablet Take 80 mg by mouth daily.        No current facility-administered medications for this visit.    Allergies:    Allergies  Allergen Reactions  . Shellfish Allergy Hives and Swelling    Social History:  The patient  reports that he has never smoked. He has never used smokeless tobacco. He reports that he drinks alcohol. He reports that he does not use illicit drugs.   ROS:  Please see the history of present illness.      All other systems reviewed and negative.   OBJECTIVE: VS:  BP 130/80  Pulse 60  Ht 5\' 10"  (1.778 m)  Wt 265 lb (120.203 kg)  BMI 38.02  kg/m2 Well nourished, well developed, in no acute distress, obese HEENT: normal Neck: JVD none. Carotid bruit none  Cardiac:  normal S1, S2; RRR; no murmur. S4 gallop is audible Lungs:  clear to auscultation bilaterally, no wheezing, rhonchi or rales Abd: soft, nontender, no hepatomegaly Ext: Edema 3-4+ ankles to knees.. Pulses difficult to palpate Skin: warm and dry Neuro:  CNs 2-12 intact, no focal abnormalities noted  EKG:  None       Signed, Illene Labrador III, MD 03/27/2013 9:48 AM

## 2013-03-30 ENCOUNTER — Encounter (HOSPITAL_COMMUNITY): Payer: Self-pay

## 2013-03-30 ENCOUNTER — Encounter (HOSPITAL_COMMUNITY)
Admission: RE | Admit: 2013-03-30 | Discharge: 2013-03-30 | Disposition: A | Discharge status: Home / Self Care | Payer: Medicare Other | Source: Ambulatory Visit | Attending: Nephrology | Admitting: Nephrology

## 2013-03-30 DIAGNOSIS — E756 Lipid storage disorder, unspecified: Secondary | ICD-10-CM | POA: Diagnosis not present

## 2013-03-30 LAB — IRON AND TIBC
Iron: 73 ug/dL (ref 42–135)
Saturation Ratios: 27 % (ref 20–55)
TIBC: 267 ug/dL (ref 215–435)
UIBC: 194 ug/dL (ref 125–400)

## 2013-03-30 LAB — LIPID PANEL
Cholesterol: 205 mg/dL — ABNORMAL HIGH (ref 0–200)
HDL: 45 mg/dL (ref >39)
LDL Cholesterol: 116 mg/dL — ABNORMAL HIGH (ref 0–99)
Total CHOL/HDL Ratio: 4.6 RATIO
Triglycerides: 221 mg/dL — ABNORMAL HIGH (ref <150)
VLDL: 44 mg/dL — ABNORMAL HIGH (ref 0–40)

## 2013-03-30 LAB — CBC WITH DIFFERENTIAL/PLATELET
Basophils Absolute: 0 10*3/uL (ref 0.0–0.1)
Basophils Relative: 0 % (ref 0–1)
Eosinophils Absolute: 0.3 10*3/uL (ref 0.0–0.7)
Eosinophils Relative: 4 % (ref 0–5)
HCT: 44.4 % (ref 39.0–52.0)
Hemoglobin: 15.1 g/dL (ref 13.0–17.0)
Lymphocytes Relative: 48 % — ABNORMAL HIGH (ref 12–46)
Lymphs Abs: 3.6 10*3/uL (ref 0.7–4.0)
MCH: 30.2 pg (ref 26.0–34.0)
MCHC: 34 g/dL (ref 30.0–36.0)
MCV: 88.8 fL (ref 78.0–100.0)
Monocytes Absolute: 0.6 10*3/uL (ref 0.1–1.0)
Monocytes Relative: 8 % (ref 3–12)
Neutro Abs: 3 10*3/uL (ref 1.7–7.7)
Neutrophils Relative %: 40 % — ABNORMAL LOW (ref 43–77)
Platelets: 189 10*3/uL (ref 150–400)
RBC: 5 MIL/uL (ref 4.22–5.81)
RDW: 12.7 % (ref 11.5–15.5)
WBC: 7.4 10*3/uL (ref 4.0–10.5)

## 2013-03-30 LAB — FERRITIN: Ferritin: 412 ng/mL — ABNORMAL HIGH (ref 22–322)

## 2013-03-30 MED ORDER — SODIUM CHLORIDE 0.9 % IV SOLN
INTRAVENOUS | Status: DC
  Administered 2013-03-30: 08:00:00 via INTRAVENOUS

## 2013-03-30 MED ORDER — SODIUM CHLORIDE 0.9 % IV SOLN
115.0000 mg | INTRAVENOUS | Status: DC
  Administered 2013-03-30: 115 mg via INTRAVENOUS
  Filled 2013-03-30: qty 23

## 2013-04-05 ENCOUNTER — Encounter (HOSPITAL_COMMUNITY): Payer: Medicare Other

## 2013-04-13 ENCOUNTER — Encounter (HOSPITAL_COMMUNITY)
Admission: RE | Admit: 2013-04-13 | Discharge: 2013-04-13 | Disposition: A | Discharge status: Home / Self Care | Payer: Medicare Other | Source: Ambulatory Visit | Attending: Nephrology | Admitting: Nephrology

## 2013-04-13 ENCOUNTER — Encounter (HOSPITAL_COMMUNITY): Payer: Self-pay

## 2013-04-13 DIAGNOSIS — E756 Lipid storage disorder, unspecified: Secondary | ICD-10-CM | POA: Insufficient documentation

## 2013-04-13 LAB — URINALYSIS, ROUTINE W REFLEX MICROSCOPIC
Bilirubin Urine: NEGATIVE
Glucose, UA: NEGATIVE mg/dL
Hgb urine dipstick: NEGATIVE
Ketones, ur: NEGATIVE mg/dL
Leukocytes, UA: NEGATIVE
Nitrite: NEGATIVE
Protein, ur: 30 mg/dL — AB
Specific Gravity, Urine: 1.013 (ref 1.005–1.030)
Urobilinogen, UA: 0.2 mg/dL (ref 0.0–1.0)
pH: 5.5 (ref 5.0–8.0)

## 2013-04-13 LAB — COMPREHENSIVE METABOLIC PANEL
ALT: 33 U/L (ref 0–53)
AST: 32 U/L (ref 0–37)
Albumin: 3.3 g/dL — ABNORMAL LOW (ref 3.5–5.2)
Alkaline Phosphatase: 73 U/L (ref 39–117)
BUN: 13 mg/dL (ref 6–23)
CO2: 23 mEq/L (ref 19–32)
Calcium: 9.4 mg/dL (ref 8.4–10.5)
Chloride: 104 mEq/L (ref 96–112)
Creatinine, Ser: 1.17 mg/dL (ref 0.50–1.35)
GFR calc Af Amer: 81 mL/min — ABNORMAL LOW (ref >90)
GFR calc non Af Amer: 70 mL/min — ABNORMAL LOW (ref >90)
Glucose, Bld: 168 mg/dL — ABNORMAL HIGH (ref 70–99)
Potassium: 4.6 mEq/L (ref 3.5–5.1)
Sodium: 138 mEq/L (ref 135–145)
Total Bilirubin: 0.3 mg/dL (ref 0.3–1.2)
Total Protein: 7.4 g/dL (ref 6.0–8.3)

## 2013-04-13 LAB — PHOSPHORUS: Phosphorus: 4 mg/dL (ref 2.3–4.6)

## 2013-04-13 LAB — URINE MICROSCOPIC-ADD ON

## 2013-04-13 MED ORDER — AGALSIDASE BETA 5 MG IV SOLR
115.0000 mg | INTRAVENOUS | Status: DC
  Administered 2013-04-13: 115 mg via INTRAVENOUS
  Filled 2013-04-13: qty 23

## 2013-04-13 MED ORDER — SODIUM CHLORIDE 0.9 % IV SOLN
INTRAVENOUS | Status: DC
Start: 2013-04-13 — End: 2013-04-14
  Administered 2013-04-13: 08:00:00 via INTRAVENOUS

## 2013-04-16 DIAGNOSIS — E109 Type 1 diabetes mellitus without complications: Secondary | ICD-10-CM | POA: Diagnosis not present

## 2013-04-17 ENCOUNTER — Encounter: Payer: Self-pay | Admitting: Interventional Cardiology

## 2013-04-17 DIAGNOSIS — N182 Chronic kidney disease, stage 2 (mild): Secondary | ICD-10-CM | POA: Diagnosis not present

## 2013-04-17 DIAGNOSIS — R7989 Other specified abnormal findings of blood chemistry: Secondary | ICD-10-CM | POA: Diagnosis not present

## 2013-04-24 ENCOUNTER — Telehealth: Payer: Self-pay | Admitting: Interventional Cardiology

## 2013-04-24 NOTE — Telephone Encounter (Signed)
Pt Dropped Off [place] papers That Need Completed, gave to [place] for Completion

## 2013-04-27 ENCOUNTER — Telehealth: Payer: Self-pay

## 2013-04-27 ENCOUNTER — Encounter (HOSPITAL_COMMUNITY)
Admission: RE | Admit: 2013-04-27 | Discharge: 2013-04-27 | Disposition: A | Discharge status: Home / Self Care | Payer: Medicare Other | Source: Ambulatory Visit | Attending: Nephrology | Admitting: Nephrology

## 2013-04-27 ENCOUNTER — Encounter (HOSPITAL_COMMUNITY): Payer: Self-pay

## 2013-04-27 MED ORDER — SODIUM CHLORIDE 0.9 % IV SOLN
115.0000 mg | INTRAVENOUS | Status: DC
  Administered 2013-04-27: 115 mg via INTRAVENOUS
  Filled 2013-04-27: qty 23

## 2013-04-27 MED ORDER — SODIUM CHLORIDE 0.9 % IV SOLN
INTRAVENOUS | Status: DC
  Administered 2013-04-27: 250 mL via INTRAVENOUS

## 2013-04-27 NOTE — Telephone Encounter (Signed)
lmom. pt disability forms completed by Dr.Smith.and left at the foen desk

## 2013-05-11 ENCOUNTER — Encounter (HOSPITAL_COMMUNITY): Payer: Self-pay

## 2013-05-11 ENCOUNTER — Other Ambulatory Visit (HOSPITAL_COMMUNITY): Payer: Self-pay | Admitting: Nephrology

## 2013-05-11 ENCOUNTER — Encounter (HOSPITAL_COMMUNITY)
Admission: RE | Admit: 2013-05-11 | Discharge: 2013-05-11 | Disposition: A | Discharge status: Home / Self Care | Payer: Medicare Other | Source: Ambulatory Visit | Attending: Nephrology | Admitting: Nephrology

## 2013-05-11 DIAGNOSIS — E756 Lipid storage disorder, unspecified: Secondary | ICD-10-CM | POA: Diagnosis not present

## 2013-05-11 LAB — COMPREHENSIVE METABOLIC PANEL
ALT: 52 U/L (ref 0–53)
AST: 37 U/L (ref 0–37)
Albumin: 3.3 g/dL — ABNORMAL LOW (ref 3.5–5.2)
Alkaline Phosphatase: 75 U/L (ref 39–117)
BUN: 18 mg/dL (ref 6–23)
CO2: 23 mEq/L (ref 19–32)
Calcium: 9.6 mg/dL (ref 8.4–10.5)
Chloride: 102 mEq/L (ref 96–112)
Creatinine, Ser: 1.22 mg/dL (ref 0.50–1.35)
GFR calc Af Amer: 77 mL/min — ABNORMAL LOW (ref >90)
GFR calc non Af Amer: 67 mL/min — ABNORMAL LOW (ref >90)
Glucose, Bld: 179 mg/dL — ABNORMAL HIGH (ref 70–99)
Potassium: 4.2 mEq/L (ref 3.5–5.1)
Sodium: 138 mEq/L (ref 135–145)
Total Bilirubin: 0.3 mg/dL (ref 0.3–1.2)
Total Protein: 7.2 g/dL (ref 6.0–8.3)

## 2013-05-11 LAB — URINALYSIS, ROUTINE W REFLEX MICROSCOPIC
Bilirubin Urine: NEGATIVE
Glucose, UA: NEGATIVE mg/dL
Hgb urine dipstick: NEGATIVE
Ketones, ur: NEGATIVE mg/dL
Leukocytes, UA: NEGATIVE
Nitrite: NEGATIVE
Protein, ur: NEGATIVE mg/dL
Specific Gravity, Urine: 1.008 (ref 1.005–1.030)
Urobilinogen, UA: 0.2 mg/dL (ref 0.0–1.0)
pH: 5.5 (ref 5.0–8.0)

## 2013-05-11 LAB — PHOSPHORUS: Phosphorus: 3.7 mg/dL (ref 2.3–4.6)

## 2013-05-11 MED ORDER — SODIUM CHLORIDE 0.9 % IV SOLN
INTRAVENOUS | Status: DC
  Administered 2013-05-11: 09:00:00 via INTRAVENOUS

## 2013-05-11 MED ORDER — SODIUM CHLORIDE 0.9 % IV SOLN
115.0000 mg | INTRAVENOUS | Status: DC
  Administered 2013-05-11: 115 mg via INTRAVENOUS
  Filled 2013-05-11: qty 23

## 2013-05-21 ENCOUNTER — Encounter: Payer: Self-pay | Admitting: Internal Medicine

## 2013-05-21 ENCOUNTER — Ambulatory Visit (INDEPENDENT_AMBULATORY_CARE_PROVIDER_SITE_OTHER): Payer: Medicare Other | Admitting: Internal Medicine

## 2013-05-21 VITALS — BP 148/97 | HR 63 | Ht 70.0 in | Wt 271.1 lb

## 2013-05-21 DIAGNOSIS — I4891 Unspecified atrial fibrillation: Secondary | ICD-10-CM | POA: Diagnosis not present

## 2013-05-21 DIAGNOSIS — I5032 Chronic diastolic (congestive) heart failure: Secondary | ICD-10-CM

## 2013-05-21 DIAGNOSIS — I441 Atrioventricular block, second degree: Secondary | ICD-10-CM | POA: Diagnosis not present

## 2013-05-21 DIAGNOSIS — Z950 Presence of cardiac pacemaker: Secondary | ICD-10-CM | POA: Diagnosis not present

## 2013-05-21 DIAGNOSIS — I480 Paroxysmal atrial fibrillation: Secondary | ICD-10-CM

## 2013-05-21 LAB — MDC_IDC_ENUM_SESS_TYPE_INCLINIC
Battery Remaining Longevity: 108 mo
Battery Voltage: 2.95 V
Brady Statistic RA Percent Paced: 29 %
Brady Statistic RV Percent Paced: 2.2 %
Date Time Interrogation Session: 20141215172129
Implantable Pulse Generator Model: 2210
Implantable Pulse Generator Serial Number: 7267950
Lead Channel Impedance Value: 400 Ohm
Lead Channel Impedance Value: 425 Ohm
Lead Channel Pacing Threshold Amplitude: 0.625 V
Lead Channel Pacing Threshold Amplitude: 1 V
Lead Channel Pacing Threshold Pulse Width: 0.5 ms
Lead Channel Pacing Threshold Pulse Width: 0.5 ms
Lead Channel Sensing Intrinsic Amplitude: 12 mV
Lead Channel Sensing Intrinsic Amplitude: 5 mV
Lead Channel Setting Pacing Amplitude: 1.125
Lead Channel Setting Pacing Amplitude: 2 V
Lead Channel Setting Pacing Pulse Width: 0.5 ms
Lead Channel Setting Sensing Sensitivity: 2 mV

## 2013-05-21 NOTE — Patient Instructions (Signed)
Your physician recommends that you continue on your current medications as directed. Please refer to the Current Medication list given to you today.  Remote monitoring is used to monitor your Pacemaker of ICD from home. This monitoring reduces the number of office visits required to check your device to one time per year. It allows [place] to keep an eye on the functioning of your device to ensure it is working properly. You are scheduled for a device check from home on 08/22/2013. You may send your transmission at any time that day. If you have a wireless device, the transmission will be sent automatically. After your physician reviews your transmission, you will receive a postcard with your next transmission date.  Your physician wants you to follow-up in: 1 year with Dr. Rayann Heman.  You will receive a reminder letter in the mail two months in advance. If you don't receive a letter, please call our office to schedule the follow-up appointment.

## 2013-05-21 NOTE — Progress Notes (Signed)
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 male who presents today for routine electrophysiology followup.  Since last being seen in our clinic, the patient reports doing very well.  Today, he denies symptoms of palpitations, chest pain, shortness of breath (above baseline),  dizziness, presyncope, or syncope.  His edema is unchanged.  The patient is otherwise without complaint today.   Past Medical History  Diagnosis Date  . Second degree Mobitz II AV block     with syncope, s/p PPM  . Hypertension   . Bifascicular block   . Diabetes mellitus   . Pacemaker     02/12/11  . Lymphedema of lower extremity LEFT  >  RIGHT    USES PUMP AT HOME-- DOES NOT WEAR HOSE  . Short of breath on exertion   . History of cellulitis of skin with lymphangitis LEFT LEG  . Diastolic heart failure secondary to hypertrophic cardiomyopathy CARDIOLOGIST-- DR Daneen Schick  . Fabry's disease RENAL AND CARDIAC INVOVLEMENT    FOLLOWED DR [place]  . Chronic renal insufficiency   . Gout, arthritis 11/2011    bil feet. right worse  . Seasonal asthma NO INHALERS   Past Surgical History  Procedure Laterality Date  . Pacemaker insertion  02/12/11    SJM implanted by Dr Rayann Heman for Mobitz II second degree AV block and syncope  . Cardiac catheterization  03-12-2011  DR Daneen Schick    HYPERTROPHIC CARDIOMYOPATHY WITH LV CAVITY  APPEARANCE CONSISTANT WITH SIGNIFICANT APICAL HYPERTROPHY/ NORMAL LVSF / EF 55%/ EVIDENCE OF DIASTOLIC DYSFUNCTION WITH EDP OF 23-32mmHg AFTER A-WAVE/ NORMAL CORONARY ARTERIES  . Umbilical hernia repair  03/22/2012    Procedure: HERNIA REPAIR UMBILICAL ADULT;  Surgeon: Leighton Ruff, MD;  Location: [facility];  Service: General;  Laterality: N/A;  Umbilical Hernia Repair   . Hernia repair  [id number]    Umbilical Hernia Repair    Current Outpatient Prescriptions  Medication Sig Dispense Refill  . acetaminophen (TYLENOL) 500 MG tablet Only taking 1 hour  before infusion.  AS premed for Fabrazyme      . Agalsidase beta (FABRAZYME IV) Inject 1 application into the vein every 14 (fourteen) days.       [name] Kitchen albuterol (PROVENTIL HFA;VENTOLIN HFA) 108 (90 BASE) MCG/ACT inhaler Inhale 2 puffs into the lungs every 6 (six) hours as needed for wheezing.      [name] Kitchen allopurinol (ZYLOPRIM) 100 MG tablet Take 300 mg by mouth daily.       [name] Kitchen aspirin 81 MG tablet Take 81 mg by mouth daily.       . colchicine (COLCRYS) 0.6 MG tablet Take 0.6 mg by mouth daily.      [name] Kitchen eplerenone (INSPRA) 50 MG tablet Take 50 mg by mouth daily.      . fexofenadine (ALLEGRA) 60 MG tablet Take 60 mg by mouth once.      . glyBURIDE (DIABETA) 5 MG tablet Take 5 mg by mouth daily with breakfast.      . losartan-hydrochlorothiazide (HYZAAR) 50-12.5 MG per tablet Take 1 tablet by mouth daily.      . mometasone-formoterol (DULERA) 100-5 MCG/ACT AERO Inhale 2 puffs into the lungs 2 (two) times daily.      . naproxen sodium (ALEVE) 220 MG tablet Take 1 tablet (220 mg total) by mouth 2 (two) times daily with a meal.  14 tablet  0  . oxyCODONE-acetaminophen (PERCOCET/ROXICET) 5-325 MG per tablet Take 1 tablet by mouth  every 4 (four) hours as needed for pain.  12 tablet  0  . [DISCONTINUED] furosemide (LASIX) 20 MG tablet Take 80 mg by mouth daily.        No current facility-administered medications for this visit.    Physical Exam: Filed Vitals:   05/21/13 1342  BP: 148/97  Pulse: 63  Height: 5\' 10"  (1.778 m)  Weight: 271 lb 1.9 oz (122.979 kg)    GEN- The patient is well appearing, alert and oriented x 3 today.   Head- normocephalic, atraumatic Eyes-  Sclera clear, conjunctiva pink Ears- hearing intact Oropharynx- clear Lungs- Clear to ausculation bilaterally, normal work of breathing Chest- pacemaker pocket is well healed Heart- Regular rate and rhythm, no murmurs, rubs or gallops, PMI not laterally displaced GI- soft, NT, ND, + BS Extremities- no clubbing, cyanosis, +2 stable  edema  Pacemaker interrogation- reviewed in detail today,  See PACEART report  Assessment and Plan:  Mobitz (type) II atrioventricular block  Normal pacemaker function  See Pace Art report   Atrial fibrillation  Episodes reviewed appeared to be oversensing on the atrial channel. I have decreased sensitivity again today.  Hypertension   Stable  No change required today   Dyspnea   stable  Merlin checks every 3 months Return in 1 year

## 2013-05-22 DIAGNOSIS — R7989 Other specified abnormal findings of blood chemistry: Secondary | ICD-10-CM | POA: Diagnosis not present

## 2013-05-25 ENCOUNTER — Encounter (HOSPITAL_COMMUNITY)
Admission: RE | Admit: 2013-05-25 | Discharge: 2013-05-25 | Disposition: A | Discharge status: Home / Self Care | Payer: Medicare Other | Source: Ambulatory Visit | Attending: Nephrology | Admitting: Nephrology

## 2013-05-25 ENCOUNTER — Encounter (HOSPITAL_COMMUNITY): Payer: Self-pay

## 2013-05-25 DIAGNOSIS — E756 Lipid storage disorder, unspecified: Secondary | ICD-10-CM | POA: Diagnosis not present

## 2013-05-25 MED ORDER — SODIUM CHLORIDE 0.9 % IV SOLN
INTRAVENOUS | Status: DC
  Administered 2013-05-25: 09:00:00 via INTRAVENOUS

## 2013-05-25 MED ORDER — SODIUM CHLORIDE 0.9 % IV SOLN
115.0000 mg | INTRAVENOUS | Status: DC
  Administered 2013-05-25: 115 mg via INTRAVENOUS
  Filled 2013-05-25: qty 23

## 2013-06-08 ENCOUNTER — Encounter (HOSPITAL_COMMUNITY): Payer: Self-pay

## 2013-06-08 ENCOUNTER — Other Ambulatory Visit (HOSPITAL_COMMUNITY): Payer: Self-pay | Admitting: Nephrology

## 2013-06-08 ENCOUNTER — Encounter (HOSPITAL_COMMUNITY)
Admission: RE | Admit: 2013-06-08 | Discharge: 2013-06-08 | Disposition: A | Discharge status: Home / Self Care | Payer: Medicare Other | Source: Ambulatory Visit | Attending: Nephrology | Admitting: Nephrology

## 2013-06-08 DIAGNOSIS — E756 Lipid storage disorder, unspecified: Secondary | ICD-10-CM | POA: Diagnosis not present

## 2013-06-08 LAB — COMPREHENSIVE METABOLIC PANEL
ALT: 59 U/L — ABNORMAL HIGH (ref 0–53)
AST: 43 U/L — ABNORMAL HIGH (ref 0–37)
Albumin: 3.6 g/dL (ref 3.5–5.2)
Alkaline Phosphatase: 78 U/L (ref 39–117)
BUN: 25 mg/dL — ABNORMAL HIGH (ref 6–23)
CO2: 23 mEq/L (ref 19–32)
Calcium: 8.9 mg/dL (ref 8.4–10.5)
Chloride: 100 mEq/L (ref 96–112)
Creatinine, Ser: 1.29 mg/dL (ref 0.50–1.35)
GFR calc Af Amer: 72 mL/min — ABNORMAL LOW (ref >90)
GFR calc non Af Amer: 62 mL/min — ABNORMAL LOW (ref >90)
Glucose, Bld: 158 mg/dL — ABNORMAL HIGH (ref 70–99)
Potassium: 4.4 mEq/L (ref 3.7–5.3)
Sodium: 137 mEq/L (ref 137–147)
Total Bilirubin: 0.4 mg/dL (ref 0.3–1.2)
Total Protein: 7.5 g/dL (ref 6.0–8.3)

## 2013-06-08 LAB — URINALYSIS, ROUTINE W REFLEX MICROSCOPIC
Bilirubin Urine: NEGATIVE
Glucose, UA: NEGATIVE mg/dL
Hgb urine dipstick: NEGATIVE
Ketones, ur: NEGATIVE mg/dL
Leukocytes, UA: NEGATIVE
Nitrite: NEGATIVE
Protein, ur: NEGATIVE mg/dL
Specific Gravity, Urine: 1.011 (ref 1.005–1.030)
Urobilinogen, UA: 0.2 mg/dL (ref 0.0–1.0)
pH: 6 (ref 5.0–8.0)

## 2013-06-08 LAB — PHOSPHORUS: Phosphorus: 4.3 mg/dL (ref 2.3–4.6)

## 2013-06-08 MED ORDER — SODIUM CHLORIDE 0.9 % IV SOLN
INTRAVENOUS | Status: DC
  Administered 2013-06-08: 09:00:00 via INTRAVENOUS

## 2013-06-08 MED ORDER — SODIUM CHLORIDE 0.9 % IV SOLN
115.0000 mg | INTRAVENOUS | Status: DC
  Administered 2013-06-08: 115 mg via INTRAVENOUS
  Filled 2013-06-08: qty 23

## 2013-06-08 NOTE — Discharge Instructions (Signed)
Agalsidase Beta injection What is this medicine? AGALSIDASE BETA is used to replace an enzyme that is missing in patients with Fabry disease. It is not a cure. This medicine may be used for other purposes; ask your health care provider or pharmacist if you have questions. COMMON BRAND NAME(S): Fabrazyme What should I tell my health care provider before I take this medicine? They need to know if you have any of these conditions: -heart disease -an unusual or allergic reaction to agalsidase beta, mannitol, other medicines, foods, dyes, or preservatives -pregnant or trying to get pregnant -breast-feeding How should I use this medicine? This medicine is for infusion into a vein. It is given by a health care professional in a hospital or clinic setting. Talk to your pediatrician regarding the use of this medicine in children. Special care may be needed. Overdosage: If you think you have taken too much of this medicine contact a poison control center or emergency room at once. NOTE: This medicine is only for you. Do not share this medicine with others. What if I miss a dose? It is important not to miss your dose. Call your doctor or health care professional if you are unable to keep an appointment. What may interact with this medicine? -amiodarone -chloroquine -gentamicin -hydroxychloroquine -monobenzone This list may not describe all possible interactions. Give your health care provider a list of all the medicines, herbs, non-prescription drugs, or dietary supplements you use. Also tell them if you smoke, drink alcohol, or use illegal drugs. Some items may interact with your medicine. What should I watch for while using this medicine? Visit your doctor or health care professional for regular checks on your progress. Tell your doctor or healthcare professional if your symptoms do not start to get better or if they get worse. There is a registry for patients with Fabry disease. The registry is  used to gather information about the disease and its effects. Talk to your health care provider if you would like to join the registry. What side effects may I notice from receiving this medicine? Side effects that you should report to your doctor or health care professional as soon as possible: -allergic reactions like skin rash, itching or hives, swelling of the face, lips, or tongue -breathing problems -chest pain, tightness -depression -dizziness -fast, irregular heart beat -swelling of the arms or legs Side effects that usually do not require medical attention (report to your doctor or health care professional if they continue or are bothersome): -aches or pains -anxiety -fever or chills at the time of injection -headache -nausea, vomiting -stomach pain, upset This list may not describe all possible side effects. Call your doctor for medical advice about side effects. You may report side effects to FDA at 1-800-FDA-1088. Where should I keep my medicine? This drug is given in a hospital or clinic and will not be stored at home. NOTE: This sheet is a summary. It may not cover all possible information. If you have questions about this medicine, talk to your doctor, pharmacist, or health care provider.  2014, Elsevier/Gold Standard. (2006-01-31 12:25:00)

## 2013-06-22 ENCOUNTER — Encounter (HOSPITAL_COMMUNITY)
Admission: RE | Admit: 2013-06-22 | Discharge: 2013-06-22 | Disposition: A | Discharge status: Home / Self Care | Payer: Medicare Other | Source: Ambulatory Visit | Attending: Nephrology | Admitting: Nephrology

## 2013-06-22 ENCOUNTER — Encounter (HOSPITAL_COMMUNITY): Payer: Self-pay

## 2013-06-22 LAB — CBC WITH DIFFERENTIAL/PLATELET
Basophils Absolute: 0 10*3/uL (ref 0.0–0.1)
Basophils Relative: 0 % (ref 0–1)
Eosinophils Absolute: 0.3 10*3/uL (ref 0.0–0.7)
Eosinophils Relative: 4 % (ref 0–5)
HCT: 43.9 % (ref 39.0–52.0)
Hemoglobin: 14.9 g/dL (ref 13.0–17.0)
Lymphocytes Relative: 42 % (ref 12–46)
Lymphs Abs: 3.4 10*3/uL (ref 0.7–4.0)
MCH: 30 pg (ref 26.0–34.0)
MCHC: 33.9 g/dL (ref 30.0–36.0)
MCV: 88.5 fL (ref 78.0–100.0)
Monocytes Absolute: 0.8 10*3/uL (ref 0.1–1.0)
Monocytes Relative: 10 % (ref 3–12)
Neutro Abs: 3.5 10*3/uL (ref 1.7–7.7)
Neutrophils Relative %: 44 % (ref 43–77)
Platelets: 202 10*3/uL (ref 150–400)
RBC: 4.96 MIL/uL (ref 4.22–5.81)
RDW: 13.2 % (ref 11.5–15.5)
WBC: 8 10*3/uL (ref 4.0–10.5)

## 2013-06-22 LAB — FERRITIN: Ferritin: 375 ng/mL — ABNORMAL HIGH (ref 22–322)

## 2013-06-22 LAB — LIPID PANEL
Cholesterol: 212 mg/dL — ABNORMAL HIGH (ref 0–200)
HDL: 39 mg/dL — ABNORMAL LOW (ref >39)
LDL Cholesterol: 125 mg/dL — ABNORMAL HIGH (ref 0–99)
Total CHOL/HDL Ratio: 5.4 RATIO
Triglycerides: 242 mg/dL — ABNORMAL HIGH (ref <150)
VLDL: 48 mg/dL — ABNORMAL HIGH (ref 0–40)

## 2013-06-22 LAB — IRON AND TIBC
Iron: 61 ug/dL (ref 42–135)
Saturation Ratios: 22 % (ref 20–55)
TIBC: 277 ug/dL (ref 215–435)
UIBC: 216 ug/dL (ref 125–400)

## 2013-06-22 MED ORDER — SODIUM CHLORIDE 0.9 % IV SOLN
115.0000 mg | INTRAVENOUS | Status: AC
  Administered 2013-06-22: 115 mg via INTRAVENOUS
  Filled 2013-06-22: qty 23

## 2013-06-22 MED ORDER — SODIUM CHLORIDE 0.9 % IV SOLN
INTRAVENOUS | Status: AC
  Administered 2013-06-22: 09:00:00 via INTRAVENOUS

## 2013-06-22 NOTE — Progress Notes (Signed)
Pt here for Fabrazyme infusion. Every 3 months lab work drawn( fasting). Pt states he took Tylenol and Allegra 1 hour prior to arrival.

## 2013-06-22 NOTE — Progress Notes (Signed)
Uneventful infusion of Fabrazyme. Pt discharged ambulatory unaccompanied to back door of Short Stay

## 2013-06-22 NOTE — Discharge Instructions (Signed)
Drink fluids/water as tolerated over next 72hrs Tylenol or Ibuprofen OTC as directed Continue calcium and Vit D as directed by your MDAgalsidase Beta injection What is this medicine? AGALSIDASE BETA is used to replace an enzyme that is missing in patients with Fabry disease. It is not a cure. This medicine may be used for other purposes; ask your health care provider or pharmacist if you have questions. COMMON BRAND NAME(S): Fabrazyme What should I tell my health care provider before I take this medicine? They need to know if you have any of these conditions: -heart disease -an unusual or allergic reaction to agalsidase beta, mannitol, other medicines, foods, dyes, or preservatives -pregnant or trying to get pregnant -breast-feeding How should I use this medicine? This medicine is for infusion into a vein. It is given by a health care professional in a hospital or clinic setting. Talk to your pediatrician regarding the use of this medicine in children. Special care may be needed. Overdosage: If you think you have taken too much of this medicine contact a poison control center or emergency room at once. NOTE: This medicine is only for you. Do not share this medicine with others. What if I miss a dose? It is important not to miss your dose. Call your doctor or health care professional if you are unable to keep an appointment. What may interact with this medicine? -amiodarone -chloroquine -gentamicin -hydroxychloroquine -monobenzone This list may not describe all possible interactions. Give your health care provider a list of all the medicines, herbs, non-prescription drugs, or dietary supplements you use. Also tell them if you smoke, drink alcohol, or use illegal drugs. Some items may interact with your medicine. What should I watch for while using this medicine? Visit your doctor or health care professional for regular checks on your progress. Tell your doctor or healthcare professional  if your symptoms do not start to get better or if they get worse. There is a registry for patients with Fabry disease. The registry is used to gather information about the disease and its effects. Talk to your health care provider if you would like to join the registry. What side effects may I notice from receiving this medicine? Side effects that you should report to your doctor or health care professional as soon as possible: -allergic reactions like skin rash, itching or hives, swelling of the face, lips, or tongue -breathing problems -chest pain, tightness -depression -dizziness -fast, irregular heart beat -swelling of the arms or legs Side effects that usually do not require medical attention (report to your doctor or health care professional if they continue or are bothersome): -aches or pains -anxiety -fever or chills at the time of injection -headache -nausea, vomiting -stomach pain, upset This list may not describe all possible side effects. Call your doctor for medical advice about side effects. You may report side effects to FDA at 1-800-FDA-1088. Where should I keep my medicine? This drug is given in a hospital or clinic and will not be stored at home. NOTE: This sheet is a summary. It may not cover all possible information. If you have questions about this medicine, talk to your doctor, pharmacist, or health care provider.  2014, Elsevier/Gold Standard. (2006-01-31 12:25:00)

## 2013-07-06 ENCOUNTER — Encounter (HOSPITAL_COMMUNITY)
Admission: RE | Admit: 2013-07-06 | Discharge: 2013-07-06 | Disposition: A | Discharge status: Home / Self Care | Payer: Medicare Other | Source: Ambulatory Visit | Attending: Nephrology | Admitting: Nephrology

## 2013-07-06 ENCOUNTER — Other Ambulatory Visit (HOSPITAL_COMMUNITY): Payer: Self-pay | Admitting: Nephrology

## 2013-07-06 ENCOUNTER — Encounter (HOSPITAL_COMMUNITY): Payer: Self-pay

## 2013-07-06 LAB — URINALYSIS W MICROSCOPIC + REFLEX CULTURE
Bilirubin Urine: NEGATIVE
Glucose, UA: NEGATIVE mg/dL
Hgb urine dipstick: NEGATIVE
Ketones, ur: NEGATIVE mg/dL
Leukocytes, UA: NEGATIVE
Nitrite: NEGATIVE
Protein, ur: 30 mg/dL — AB
Specific Gravity, Urine: 1.011 (ref 1.005–1.030)
Urobilinogen, UA: 0.2 mg/dL (ref 0.0–1.0)
pH: 5.5 (ref 5.0–8.0)

## 2013-07-06 LAB — COMPREHENSIVE METABOLIC PANEL
ALT: 21 U/L (ref 0–53)
AST: 19 U/L (ref 0–37)
Albumin: 2.9 g/dL — ABNORMAL LOW (ref 3.5–5.2)
Alkaline Phosphatase: 69 U/L (ref 39–117)
BUN: 16 mg/dL (ref 6–23)
CO2: 24 mEq/L (ref 19–32)
Calcium: 9 mg/dL (ref 8.4–10.5)
Chloride: 97 mEq/L (ref 96–112)
Creatinine, Ser: 1.36 mg/dL — ABNORMAL HIGH (ref 0.50–1.35)
GFR calc Af Amer: 68 mL/min — ABNORMAL LOW (ref >90)
GFR calc non Af Amer: 58 mL/min — ABNORMAL LOW (ref >90)
Glucose, Bld: 193 mg/dL — ABNORMAL HIGH (ref 70–99)
Potassium: 4.1 mEq/L (ref 3.7–5.3)
Sodium: 135 mEq/L — ABNORMAL LOW (ref 137–147)
Total Bilirubin: 0.4 mg/dL (ref 0.3–1.2)
Total Protein: 7.5 g/dL (ref 6.0–8.3)

## 2013-07-06 LAB — PHOSPHORUS: Phosphorus: 4.1 mg/dL (ref 2.3–4.6)

## 2013-07-06 MED ORDER — SODIUM CHLORIDE 0.9 % IV SOLN
115.0000 mg | INTRAVENOUS | Status: DC
  Administered 2013-07-06: 115 mg via INTRAVENOUS
  Filled 2013-07-06: qty 23

## 2013-07-06 MED ORDER — SODIUM CHLORIDE 0.9 % IV SOLN
INTRAVENOUS | Status: DC
  Administered 2013-07-06: 09:00:00 via INTRAVENOUS

## 2013-07-08 DIAGNOSIS — R609 Edema, unspecified: Secondary | ICD-10-CM

## 2013-07-08 HISTORY — DX: Edema, unspecified: R60.9

## 2013-07-16 ENCOUNTER — Ambulatory Visit (INDEPENDENT_AMBULATORY_CARE_PROVIDER_SITE_OTHER): Payer: Medicare Other | Admitting: General Surgery

## 2013-07-16 ENCOUNTER — Encounter (INDEPENDENT_AMBULATORY_CARE_PROVIDER_SITE_OTHER): Payer: Self-pay | Admitting: General Surgery

## 2013-07-16 VITALS — BP 130/84 | HR 80 | Temp 98.5°F | Resp 15 | Ht 69.0 in | Wt 270.4 lb

## 2013-07-16 DIAGNOSIS — L723 Sebaceous cyst: Secondary | ICD-10-CM | POA: Diagnosis not present

## 2013-07-16 DIAGNOSIS — L089 Local infection of the skin and subcutaneous tissue, unspecified: Secondary | ICD-10-CM

## 2013-07-16 NOTE — Patient Instructions (Signed)
Plan for I and D sebaceous cyst on back

## 2013-07-16 NOTE — Progress Notes (Signed)
[name], male   DOB: October 04, 1960, 53 y.o.   MRN: [id number]  Chief Complaint  Patient presents with  . Follow-up    cyst on back/drainage/odor    HPI [name] is a 53 y.o. male.  We are asked to see the patient in consultation by Dr. Ellsworth Lennox to evaluate him for any cyst on his back. The patient is a 53 year old black male Who has known that he has had a cyst on his back for about the last 6-8 months. He will intermittently get drainage from the area that was all soil his clothes. He denies any fevers or chills. He does not really have any pain but he does have some discomfort especially when he stretches. HPI  Past Medical History  Diagnosis Date  . Second degree Mobitz II AV block     with syncope, s/p PPM  . Hypertension   . Bifascicular block   . Diabetes mellitus   . Pacemaker     02/12/11  . Lymphedema of lower extremity LEFT  >  RIGHT    USES PUMP AT HOME-- DOES NOT WEAR HOSE  . Short of breath on exertion   . History of cellulitis of skin with lymphangitis LEFT LEG  . Diastolic heart failure secondary to hypertrophic cardiomyopathy CARDIOLOGIST-- DR Daneen Schick  . Fabry's disease RENAL AND CARDIAC INVOVLEMENT    FOLLOWED DR [place]  . Chronic renal insufficiency   . Gout, arthritis 11/2011    bil feet. right worse  . Seasonal asthma NO INHALERS    Past Surgical History  Procedure Laterality Date  . Pacemaker insertion  02/12/11    SJM implanted by Dr Rayann Heman for Mobitz II second degree AV block and syncope  . Cardiac catheterization  03-12-2011  DR Daneen Schick    HYPERTROPHIC CARDIOMYOPATHY WITH LV CAVITY  APPEARANCE CONSISTANT WITH SIGNIFICANT APICAL HYPERTROPHY/ NORMAL LVSF / EF 55%/ EVIDENCE OF DIASTOLIC DYSFUNCTION WITH EDP OF 23-71mmHg AFTER A-WAVE/ NORMAL CORONARY ARTERIES  . Umbilical hernia repair  03/22/2012    Procedure: HERNIA REPAIR UMBILICAL ADULT;  Surgeon: Leighton Ruff, MD;  Location: [facility];  Service: General;  Laterality: N/A;   Umbilical Hernia Repair   . Hernia repair  [id number]    Umbilical Hernia Repair    History reviewed. No pertinent family history.  Social History History  Substance Use Topics  . Smoking status: Never Smoker   . Smokeless tobacco: Never Used  . Alcohol Use: Yes     Comment: OCCASIONAL    Allergies  Allergen Reactions  . Shellfish Allergy Hives and Swelling    Current Outpatient Prescriptions  Medication Sig Dispense Refill  . acetaminophen (TYLENOL) 500 MG tablet Only taking 1 hour before infusion.  AS premed for Fabrazyme      . Agalsidase beta (FABRAZYME IV) Inject 1 application into the vein every 14 (fourteen) days.       [name] Kitchen albuterol (PROVENTIL HFA;VENTOLIN HFA) 108 (90 BASE) MCG/ACT inhaler Inhale 2 puffs into the lungs every 6 (six) hours as needed for wheezing.      [name] Kitchen allopurinol (ZYLOPRIM) 100 MG tablet Take 300 mg by mouth daily.       [name] Kitchen aspirin 81 MG tablet Take 81 mg by mouth daily.       . colchicine (COLCRYS) 0.6 MG tablet Take 0.6 mg by mouth daily.      [name] Kitchen eplerenone (INSPRA) 50 MG tablet Take 50 mg by mouth daily.      . fexofenadine (  ALLEGRA) 60 MG tablet Take 60 mg by mouth once.      . glyBURIDE (DIABETA) 5 MG tablet Take 5 mg by mouth daily with breakfast.      . losartan-hydrochlorothiazide (HYZAAR) 50-12.5 MG per tablet Take 1 tablet by mouth daily.      . [DISCONTINUED] furosemide (LASIX) 20 MG tablet Take 80 mg by mouth daily.        No current facility-administered medications for this visit.    Review of Systems Review of Systems  Constitutional: Negative.   HENT: Negative.   Eyes: Negative.   Respiratory: Negative.   Cardiovascular: Negative.   Gastrointestinal: Negative.   Endocrine: Negative.   Genitourinary: Negative.   Musculoskeletal: Negative.   Skin: Negative.   Allergic/Immunologic: Negative.   Neurological: Negative.   Hematological: Negative.   Psychiatric/Behavioral: Negative.     Blood pressure 130/84, pulse 80, temperature 98.5  F (36.9 C), temperature source Oral, resp. rate 15, height 5\' 9"  (1.753 m), weight 270 lb 6.4 oz (122.653 kg).  Physical Exam Physical Exam  Constitutional: He is oriented to person, place, and time. He appears well-developed and well-nourished.  HENT:  Head: Normocephalic and atraumatic.  Eyes: Conjunctivae and EOM are normal. Pupils are equal, round, and reactive to light.  Neck: Normal range of motion. Neck supple.  Cardiovascular: Normal rate, regular rhythm and normal heart sounds.   Pulmonary/Chest: Effort normal and breath sounds normal.  There is a large fluctuant area in the center of his back associated with a sebaceous cyst. His skin is discolored but not read and there is no drainage that I can express today  Abdominal: Soft. Bowel sounds are normal.  Musculoskeletal: Normal range of motion.  Neurological: He is alert and oriented to person, place, and time.  Skin: Skin is warm and dry.  Psychiatric: He has a normal mood and affect. His behavior is normal.    Data Reviewed As above  Assessment    The patient appears to have a chronic low-grade infected sebaceous cyst on the back. It is large enough that I think it will need to be incised and drained and allowed to heal secondarily.     Plan    I have discussed with him in detail the risks and benefits of the operation to drain the cyst as well as some of the technical aspects and he understands and wishes to proceed. Plan for incision and drainage of chronically infected sebaceous cyst on the back        TOTH III,Jesly Hartmann S 07/16/2013, 3:14 PM

## 2013-07-17 NOTE — Addendum Note (Signed)
Addended by: Illene Regulus on: 07/17/2013 08:56 AM   Modules accepted: Orders

## 2013-07-18 DIAGNOSIS — R7989 Other specified abnormal findings of blood chemistry: Secondary | ICD-10-CM | POA: Diagnosis not present

## 2013-07-19 ENCOUNTER — Telehealth (INDEPENDENT_AMBULATORY_CARE_PROVIDER_SITE_OTHER): Payer: Self-pay | Admitting: *Deleted

## 2013-07-19 NOTE — Telephone Encounter (Signed)
Per Gwinda Passe with [facility], pt will have home health wound care started on 07/27/13 after surgery which is scheduled on 07/26/13.

## 2013-07-20 ENCOUNTER — Encounter (HOSPITAL_COMMUNITY)
Admission: RE | Admit: 2013-07-20 | Discharge: 2013-07-20 | Disposition: A | Discharge status: Home / Self Care | Payer: Medicare Other | Source: Ambulatory Visit | Attending: Nephrology | Admitting: Nephrology

## 2013-07-20 ENCOUNTER — Encounter (HOSPITAL_COMMUNITY): Payer: Self-pay

## 2013-07-20 DIAGNOSIS — E756 Lipid storage disorder, unspecified: Secondary | ICD-10-CM | POA: Diagnosis not present

## 2013-07-20 MED ORDER — SODIUM CHLORIDE 0.9 % IV SOLN
115.0000 mg | INTRAVENOUS | Status: DC
  Administered 2013-07-20: 115 mg via INTRAVENOUS
  Filled 2013-07-20: qty 23

## 2013-07-20 MED ORDER — SODIUM CHLORIDE 0.9 % IV SOLN
INTRAVENOUS | Status: DC
  Administered 2013-07-20: 09:00:00 via INTRAVENOUS

## 2013-07-25 ENCOUNTER — Telehealth: Payer: Self-pay | Admitting: Internal Medicine

## 2013-07-25 NOTE — Telephone Encounter (Signed)
New message    When was patient last interrogated?

## 2013-07-26 ENCOUNTER — Inpatient Hospital Stay (HOSPITAL_COMMUNITY)
Admission: EM | Admit: 2013-07-26 | Discharge: 2013-07-30 | DRG: 853 | Disposition: A | Discharge status: Other Acute Inpatient Hospital | Payer: Medicare Other | Attending: Pulmonary Disease | Admitting: Pulmonary Disease

## 2013-07-26 ENCOUNTER — Inpatient Hospital Stay (HOSPITAL_COMMUNITY): Payer: Medicare Other

## 2013-07-26 ENCOUNTER — Encounter (HOSPITAL_COMMUNITY): Payer: Self-pay | Admitting: Emergency Medicine

## 2013-07-26 ENCOUNTER — Emergency Department (HOSPITAL_COMMUNITY): Payer: Medicare Other

## 2013-07-26 DIAGNOSIS — E7521 Fabry (-Anderson) disease: Secondary | ICD-10-CM

## 2013-07-26 DIAGNOSIS — Z6841 Body Mass Index (BMI) 40.0 and over, adult: Secondary | ICD-10-CM | POA: Diagnosis not present

## 2013-07-26 DIAGNOSIS — I10 Essential (primary) hypertension: Secondary | ICD-10-CM | POA: Diagnosis not present

## 2013-07-26 DIAGNOSIS — R143 Flatulence: Secondary | ICD-10-CM | POA: Diagnosis not present

## 2013-07-26 DIAGNOSIS — A419 Sepsis, unspecified organism: Principal | ICD-10-CM | POA: Diagnosis present

## 2013-07-26 DIAGNOSIS — N289 Disorder of kidney and ureter, unspecified: Secondary | ICD-10-CM | POA: Diagnosis not present

## 2013-07-26 DIAGNOSIS — N179 Acute kidney failure, unspecified: Secondary | ICD-10-CM | POA: Diagnosis present

## 2013-07-26 DIAGNOSIS — J9600 Acute respiratory failure, unspecified whether with hypoxia or hypercapnia: Secondary | ICD-10-CM | POA: Diagnosis not present

## 2013-07-26 DIAGNOSIS — I120 Hypertensive chronic kidney disease with stage 5 chronic kidney disease or end stage renal disease: Secondary | ICD-10-CM | POA: Diagnosis present

## 2013-07-26 DIAGNOSIS — M549 Dorsalgia, unspecified: Secondary | ICD-10-CM | POA: Diagnosis not present

## 2013-07-26 DIAGNOSIS — J4489 Other specified chronic obstructive pulmonary disease: Secondary | ICD-10-CM | POA: Diagnosis not present

## 2013-07-26 DIAGNOSIS — Z91013 Allergy to seafood: Secondary | ICD-10-CM | POA: Diagnosis not present

## 2013-07-26 DIAGNOSIS — I509 Heart failure, unspecified: Secondary | ICD-10-CM | POA: Diagnosis present

## 2013-07-26 DIAGNOSIS — Z9911 Dependence on respirator [ventilator] status: Secondary | ICD-10-CM | POA: Diagnosis not present

## 2013-07-26 DIAGNOSIS — L02219 Cutaneous abscess of trunk, unspecified: Secondary | ICD-10-CM | POA: Diagnosis present

## 2013-07-26 DIAGNOSIS — Z79899 Other long term (current) drug therapy: Secondary | ICD-10-CM

## 2013-07-26 DIAGNOSIS — E1100 Type 2 diabetes mellitus with hyperosmolarity without nonketotic hyperglycemic-hyperosmolar coma (NKHHC): Secondary | ICD-10-CM

## 2013-07-26 DIAGNOSIS — I5032 Chronic diastolic (congestive) heart failure: Secondary | ICD-10-CM | POA: Diagnosis not present

## 2013-07-26 DIAGNOSIS — E1010 Type 1 diabetes mellitus with ketoacidosis without coma: Secondary | ICD-10-CM | POA: Diagnosis present

## 2013-07-26 DIAGNOSIS — D649 Anemia, unspecified: Secondary | ICD-10-CM | POA: Diagnosis present

## 2013-07-26 DIAGNOSIS — E119 Type 2 diabetes mellitus without complications: Secondary | ICD-10-CM | POA: Diagnosis not present

## 2013-07-26 DIAGNOSIS — Z950 Presence of cardiac pacemaker: Secondary | ICD-10-CM | POA: Diagnosis not present

## 2013-07-26 DIAGNOSIS — M726 Necrotizing fasciitis: Secondary | ICD-10-CM | POA: Diagnosis present

## 2013-07-26 DIAGNOSIS — E872 Acidosis, unspecified: Secondary | ICD-10-CM

## 2013-07-26 DIAGNOSIS — E870 Hyperosmolality and hypernatremia: Secondary | ICD-10-CM | POA: Diagnosis present

## 2013-07-26 DIAGNOSIS — D696 Thrombocytopenia, unspecified: Secondary | ICD-10-CM | POA: Diagnosis present

## 2013-07-26 DIAGNOSIS — L03319 Cellulitis of trunk, unspecified: Secondary | ICD-10-CM

## 2013-07-26 DIAGNOSIS — J45909 Unspecified asthma, uncomplicated: Secondary | ICD-10-CM | POA: Diagnosis present

## 2013-07-26 DIAGNOSIS — I441 Atrioventricular block, second degree: Secondary | ICD-10-CM | POA: Diagnosis present

## 2013-07-26 DIAGNOSIS — I214 Non-ST elevation (NSTEMI) myocardial infarction: Secondary | ICD-10-CM | POA: Diagnosis not present

## 2013-07-26 DIAGNOSIS — L089 Local infection of the skin and subcutaneous tissue, unspecified: Secondary | ICD-10-CM | POA: Diagnosis not present

## 2013-07-26 DIAGNOSIS — I452 Bifascicular block: Secondary | ICD-10-CM | POA: Diagnosis present

## 2013-07-26 DIAGNOSIS — G4730 Sleep apnea, unspecified: Secondary | ICD-10-CM | POA: Diagnosis present

## 2013-07-26 DIAGNOSIS — IMO0002 Reserved for concepts with insufficient information to code with codable children: Secondary | ICD-10-CM | POA: Diagnosis not present

## 2013-07-26 DIAGNOSIS — I369 Nonrheumatic tricuspid valve disorder, unspecified: Secondary | ICD-10-CM | POA: Diagnosis not present

## 2013-07-26 DIAGNOSIS — S298XXA Other specified injuries of thorax, initial encounter: Secondary | ICD-10-CM | POA: Diagnosis not present

## 2013-07-26 DIAGNOSIS — J982 Interstitial emphysema: Secondary | ICD-10-CM | POA: Diagnosis not present

## 2013-07-26 DIAGNOSIS — L723 Sebaceous cyst: Secondary | ICD-10-CM | POA: Diagnosis not present

## 2013-07-26 DIAGNOSIS — M109 Gout, unspecified: Secondary | ICD-10-CM | POA: Diagnosis present

## 2013-07-26 DIAGNOSIS — G9340 Encephalopathy, unspecified: Secondary | ICD-10-CM | POA: Diagnosis present

## 2013-07-26 DIAGNOSIS — Z7982 Long term (current) use of aspirin: Secondary | ICD-10-CM | POA: Diagnosis not present

## 2013-07-26 DIAGNOSIS — E756 Lipid storage disorder, unspecified: Secondary | ICD-10-CM | POA: Diagnosis not present

## 2013-07-26 DIAGNOSIS — R6521 Severe sepsis with septic shock: Secondary | ICD-10-CM

## 2013-07-26 DIAGNOSIS — Z452 Encounter for adjustment and management of vascular access device: Secondary | ICD-10-CM | POA: Diagnosis not present

## 2013-07-26 DIAGNOSIS — E871 Hypo-osmolality and hyponatremia: Secondary | ICD-10-CM | POA: Diagnosis present

## 2013-07-26 DIAGNOSIS — R778 Other specified abnormalities of plasma proteins: Secondary | ICD-10-CM

## 2013-07-26 DIAGNOSIS — I422 Other hypertrophic cardiomyopathy: Secondary | ICD-10-CM | POA: Diagnosis present

## 2013-07-26 DIAGNOSIS — N186 End stage renal disease: Secondary | ICD-10-CM | POA: Diagnosis present

## 2013-07-26 DIAGNOSIS — R000 Tachycardia, unspecified: Secondary | ICD-10-CM | POA: Diagnosis present

## 2013-07-26 DIAGNOSIS — N189 Chronic kidney disease, unspecified: Secondary | ICD-10-CM

## 2013-07-26 DIAGNOSIS — W19XXXA Unspecified fall, initial encounter: Secondary | ICD-10-CM | POA: Diagnosis present

## 2013-07-26 DIAGNOSIS — E875 Hyperkalemia: Secondary | ICD-10-CM | POA: Diagnosis present

## 2013-07-26 DIAGNOSIS — J449 Chronic obstructive pulmonary disease, unspecified: Secondary | ICD-10-CM | POA: Diagnosis not present

## 2013-07-26 DIAGNOSIS — I4891 Unspecified atrial fibrillation: Secondary | ICD-10-CM | POA: Diagnosis present

## 2013-07-26 DIAGNOSIS — R6520 Severe sepsis without septic shock: Secondary | ICD-10-CM

## 2013-07-26 DIAGNOSIS — I480 Paroxysmal atrial fibrillation: Secondary | ICD-10-CM

## 2013-07-26 DIAGNOSIS — R0682 Tachypnea, not elsewhere classified: Secondary | ICD-10-CM | POA: Diagnosis present

## 2013-07-26 DIAGNOSIS — E1300 Other specified diabetes mellitus with hyperosmolarity without nonketotic hyperglycemic-hyperosmolar coma (NKHHC): Secondary | ICD-10-CM | POA: Diagnosis not present

## 2013-07-26 DIAGNOSIS — R7989 Other specified abnormal findings of blood chemistry: Secondary | ICD-10-CM | POA: Diagnosis not present

## 2013-07-26 DIAGNOSIS — E876 Hypokalemia: Secondary | ICD-10-CM | POA: Diagnosis present

## 2013-07-26 DIAGNOSIS — N2889 Other specified disorders of kidney and ureter: Secondary | ICD-10-CM | POA: Diagnosis not present

## 2013-07-26 DIAGNOSIS — R918 Other nonspecific abnormal finding of lung field: Secondary | ICD-10-CM | POA: Diagnosis not present

## 2013-07-26 DIAGNOSIS — R141 Gas pain: Secondary | ICD-10-CM | POA: Diagnosis not present

## 2013-07-26 DIAGNOSIS — L988 Other specified disorders of the skin and subcutaneous tissue: Secondary | ICD-10-CM | POA: Diagnosis not present

## 2013-07-26 DIAGNOSIS — Z4682 Encounter for fitting and adjustment of non-vascular catheter: Secondary | ICD-10-CM | POA: Diagnosis not present

## 2013-07-26 LAB — COMPREHENSIVE METABOLIC PANEL
ALT: 19 U/L (ref 0–53)
AST: 18 U/L (ref 0–37)
Albumin: 2.1 g/dL — ABNORMAL LOW (ref 3.5–5.2)
Alkaline Phosphatase: 170 U/L — ABNORMAL HIGH (ref 39–117)
BUN: 81 mg/dL — ABNORMAL HIGH (ref 6–23)
CO2: 12 mEq/L — ABNORMAL LOW (ref 19–32)
Calcium: 9.9 mg/dL (ref 8.4–10.5)
Chloride: 84 mEq/L — ABNORMAL LOW (ref 96–112)
Creatinine, Ser: 2.69 mg/dL — ABNORMAL HIGH (ref 0.50–1.35)
GFR calc Af Amer: 30 mL/min — ABNORMAL LOW (ref >90)
GFR calc non Af Amer: 26 mL/min — ABNORMAL LOW (ref >90)
Glucose, Bld: 1162 mg/dL (ref 70–99)
Potassium: 5.8 mEq/L — ABNORMAL HIGH (ref 3.7–5.3)
Sodium: 127 mEq/L — ABNORMAL LOW (ref 137–147)
Total Bilirubin: 0.3 mg/dL (ref 0.3–1.2)
Total Protein: 7.3 g/dL (ref 6.0–8.3)

## 2013-07-26 LAB — BASIC METABOLIC PANEL
BUN: 78 mg/dL — ABNORMAL HIGH (ref 6–23)
BUN: 77 mg/dL — ABNORMAL HIGH (ref 6–23)
CO2: 14 mEq/L — ABNORMAL LOW (ref 19–32)
CO2: 15 mEq/L — ABNORMAL LOW (ref 19–32)
Calcium: 8.5 mg/dL (ref 8.4–10.5)
Calcium: 8.6 mg/dL (ref 8.4–10.5)
Chloride: 92 mEq/L — ABNORMAL LOW (ref 96–112)
Chloride: 100 mEq/L (ref 96–112)
Creatinine, Ser: 2.65 mg/dL — ABNORMAL HIGH (ref 0.50–1.35)
Creatinine, Ser: 2.49 mg/dL — ABNORMAL HIGH (ref 0.50–1.35)
GFR calc Af Amer: 30 mL/min — ABNORMAL LOW (ref >90)
GFR calc Af Amer: 33 mL/min — ABNORMAL LOW (ref >90)
GFR calc non Af Amer: 26 mL/min — ABNORMAL LOW (ref >90)
GFR calc non Af Amer: 28 mL/min — ABNORMAL LOW (ref >90)
Glucose, Bld: 1085 mg/dL (ref 70–99)
Glucose, Bld: 912 mg/dL (ref 70–99)
Potassium: 5.3 mEq/L (ref 3.7–5.3)
Potassium: 4.2 mEq/L (ref 3.7–5.3)
Sodium: 130 mEq/L — ABNORMAL LOW (ref 137–147)
Sodium: 137 mEq/L (ref 137–147)

## 2013-07-26 LAB — CBC WITH DIFFERENTIAL/PLATELET
Basophils Absolute: 0 10*3/uL (ref 0.0–0.1)
Basophils Relative: 0 % (ref 0–1)
Eosinophils Absolute: 0 10*3/uL (ref 0.0–0.7)
Eosinophils Relative: 0 % (ref 0–5)
HCT: 41 % (ref 39.0–52.0)
Hemoglobin: 13.7 g/dL (ref 13.0–17.0)
Lymphocytes Relative: 7 % — ABNORMAL LOW (ref 12–46)
Lymphs Abs: 1.6 10*3/uL (ref 0.7–4.0)
MCH: 30.4 pg (ref 26.0–34.0)
MCHC: 33.4 g/dL (ref 30.0–36.0)
MCV: 90.9 fL (ref 78.0–100.0)
Monocytes Absolute: 1.2 10*3/uL — ABNORMAL HIGH (ref 0.1–1.0)
Monocytes Relative: 5 % (ref 3–12)
Neutro Abs: 20.5 10*3/uL — ABNORMAL HIGH (ref 1.7–7.7)
Neutrophils Relative %: 88 % — ABNORMAL HIGH (ref 43–77)
Platelets: 228 10*3/uL (ref 150–400)
RBC: 4.51 MIL/uL (ref 4.22–5.81)
RDW: 13.8 % (ref 11.5–15.5)
Smear Review: ADEQUATE
WBC: 23.3 10*3/uL — ABNORMAL HIGH (ref 4.0–10.5)

## 2013-07-26 LAB — URINALYSIS, ROUTINE W REFLEX MICROSCOPIC
Bilirubin Urine: NEGATIVE
Glucose, UA: >1000 mg/dL — AB
Ketones, ur: NEGATIVE mg/dL
Leukocytes, UA: NEGATIVE
Nitrite: NEGATIVE
Protein, ur: NEGATIVE mg/dL
Specific Gravity, Urine: 1.022 (ref 1.005–1.030)
Urobilinogen, UA: 0.2 mg/dL (ref 0.0–1.0)
pH: 5 (ref 5.0–8.0)

## 2013-07-26 LAB — I-STAT CHEM 8, ED
BUN: 74 mg/dL — ABNORMAL HIGH (ref 6–23)
Calcium, Ion: 1.18 mmol/L (ref 1.12–1.23)
Chloride: 97 mEq/L (ref 96–112)
Creatinine, Ser: 2.6 mg/dL — ABNORMAL HIGH (ref 0.50–1.35)
Glucose, Bld: >700 mg/dL (ref 70–99)
HCT: 53 % — ABNORMAL HIGH (ref 39.0–52.0)
Hemoglobin: 18 g/dL — ABNORMAL HIGH (ref 13.0–17.0)
Potassium: 5.5 mEq/L — ABNORMAL HIGH (ref 3.7–5.3)
Sodium: 129 mEq/L — ABNORMAL LOW (ref 137–147)
TCO2: 16 mmol/L (ref 0–100)

## 2013-07-26 LAB — CBG MONITORING, ED
Glucose-Capillary: >600 mg/dL (ref 70–99)
Glucose-Capillary: >600 mg/dL (ref 70–99)
Glucose-Capillary: >600 mg/dL (ref 70–99)

## 2013-07-26 LAB — I-STAT ARTERIAL BLOOD GAS, ED
Acid-base deficit: 11 mmol/L — ABNORMAL HIGH (ref 0.0–2.0)
Bicarbonate: 14.1 mEq/L — ABNORMAL LOW (ref 20.0–24.0)
O2 Saturation: 95 %
TCO2: 15 mmol/L (ref 0–100)
pCO2 arterial: 30.3 mmHg — ABNORMAL LOW (ref 35.0–45.0)
pH, Arterial: 7.276 — ABNORMAL LOW (ref 7.350–7.450)
pO2, Arterial: 84 mmHg (ref 80.0–100.0)

## 2013-07-26 LAB — TROPONIN I: Troponin I: 7.62 ng/mL (ref <0.30)

## 2013-07-26 LAB — URINE MICROSCOPIC-ADD ON

## 2013-07-26 LAB — PROTIME-INR
INR: 1.34 (ref 0.00–1.49)
Prothrombin Time: 16.3 seconds — ABNORMAL HIGH (ref 11.6–15.2)

## 2013-07-26 LAB — I-STAT TROPONIN, ED: Troponin i, poc: 0.29 ng/mL (ref 0.00–0.08)

## 2013-07-26 LAB — I-STAT CG4 LACTIC ACID, ED: Lactic Acid, Venous: 7.25 mmol/L — ABNORMAL HIGH (ref 0.5–2.2)

## 2013-07-26 LAB — TYPE AND SCREEN
ABO/RH(D): B POS
Antibody Screen: NEGATIVE

## 2013-07-26 LAB — ABO/RH: ABO/RH(D): B POS

## 2013-07-26 LAB — MAGNESIUM: Magnesium: 2.5 mg/dL (ref 1.5–2.5)

## 2013-07-26 LAB — PHOSPHORUS: Phosphorus: 6.7 mg/dL — ABNORMAL HIGH (ref 2.3–4.6)

## 2013-07-26 LAB — CARBOXYHEMOGLOBIN
Carboxyhemoglobin: 1 % (ref 0.5–1.5)
Methemoglobin: 0.7 % (ref 0.0–1.5)
O2 Saturation: 62.9 %
Total hemoglobin: 14.2 g/dL (ref 13.5–18.0)

## 2013-07-26 LAB — APTT: aPTT: 25 seconds (ref 24–37)

## 2013-07-26 LAB — MRSA PCR SCREENING: MRSA by PCR: NEGATIVE

## 2013-07-26 LAB — LACTIC ACID, PLASMA: Lactic Acid, Venous: 4.9 mmol/L — ABNORMAL HIGH (ref 0.5–2.2)

## 2013-07-26 MED ORDER — SODIUM CHLORIDE 0.9 % IV BOLUS (SEPSIS): 1000.0000 mL | Freq: Once | INTRAVENOUS | Status: DC

## 2013-07-26 MED ORDER — VANCOMYCIN HCL IN DEXTROSE 1-5 GM/200ML-% IV SOLN
1000.0000 mg | Freq: Once | INTRAVENOUS | Status: AC
  Administered 2013-07-26: 1000 mg via INTRAVENOUS
  Filled 2013-07-26: qty 200

## 2013-07-26 MED ORDER — CLINDAMYCIN PHOSPHATE 600 MG/50ML IV SOLN
600.0000 mg | Freq: Three times a day (TID) | INTRAVENOUS | Status: DC
  Administered 2013-07-26 – 2013-07-30 (×12): 600 mg via INTRAVENOUS
  Filled 2013-07-26 (×14): qty 50

## 2013-07-26 MED ORDER — SODIUM CHLORIDE 0.9 % IV SOLN
1000.0000 mL | INTRAVENOUS | Status: DC
  Administered 2013-07-26 – 2013-07-27 (×2): 1000 mL via INTRAVENOUS

## 2013-07-26 MED ORDER — ASPIRIN 300 MG RE SUPP: 300.0000 mg | RECTAL | Status: AC

## 2013-07-26 MED ORDER — HEPARIN (PORCINE) IN NACL 100-0.45 UNIT/ML-% IJ SOLN
1500.0000 [IU]/h | INTRAMUSCULAR | Status: DC
  Administered 2013-07-26: 1200 [IU]/h via INTRAVENOUS
  Filled 2013-07-26 (×3): qty 250

## 2013-07-26 MED ORDER — POTASSIUM CHLORIDE 10 MEQ/100ML IV SOLN: 10.0000 meq | INTRAVENOUS | Status: DC

## 2013-07-26 MED ORDER — SODIUM CHLORIDE 0.9 % IV SOLN
INTRAVENOUS | Status: DC
  Filled 2013-07-26: qty 1

## 2013-07-26 MED ORDER — SODIUM CHLORIDE 0.9 % IV SOLN
1000.0000 mL | Freq: Once | INTRAVENOUS | Status: AC
  Administered 2013-07-26: 1000 mL via INTRAVENOUS

## 2013-07-26 MED ORDER — SODIUM CHLORIDE 0.9 % IV SOLN: 250.0000 mL | INTRAVENOUS | Status: DC | PRN

## 2013-07-26 MED ORDER — VANCOMYCIN HCL 500 MG IV SOLR
500.0000 mg | Freq: Once | INTRAVENOUS | Status: AC
  Administered 2013-07-26: 500 mg via INTRAVENOUS
  Filled 2013-07-26 (×2): qty 500

## 2013-07-26 MED ORDER — SODIUM CHLORIDE 0.9 % IV SOLN: INTRAVENOUS | Status: DC

## 2013-07-26 MED ORDER — PIPERACILLIN-TAZOBACTAM 3.375 G IVPB
3.3750 g | Freq: Three times a day (TID) | INTRAVENOUS | Status: DC
  Administered 2013-07-26 – 2013-07-30 (×12): 3.375 g via INTRAVENOUS
  Filled 2013-07-26 (×14): qty 50

## 2013-07-26 MED ORDER — FENTANYL CITRATE 0.05 MG/ML IJ SOLN
25.0000 ug | INTRAMUSCULAR | Status: DC | PRN
  Administered 2013-07-30: 50 ug via INTRAVENOUS
  Filled 2013-07-26 (×2): qty 2

## 2013-07-26 MED ORDER — HEPARIN BOLUS VIA INFUSION
4000.0000 [IU] | Freq: Once | INTRAVENOUS | Status: AC
  Administered 2013-07-26: 4000 [IU] via INTRAVENOUS
  Filled 2013-07-26: qty 4000

## 2013-07-26 MED ORDER — INSULIN REGULAR HUMAN 100 UNIT/ML IJ SOLN
INTRAMUSCULAR | Status: DC
  Administered 2013-07-26: 5.4 [IU]/h via INTRAVENOUS
  Administered 2013-07-26: 10.8 [IU]/h via INTRAVENOUS
  Administered 2013-07-26 – 2013-07-27 (×3): via INTRAVENOUS
  Filled 2013-07-26 (×4): qty 1

## 2013-07-26 MED ORDER — SODIUM CHLORIDE 0.9 % IV BOLUS (SEPSIS)
1000.0000 mL | Freq: Once | INTRAVENOUS | Status: AC
  Administered 2013-07-26: 1000 mL via INTRAVENOUS

## 2013-07-26 MED ORDER — DEXTROSE 50 % IV SOLN: 25.0000 mL | INTRAVENOUS | Status: DC | PRN

## 2013-07-26 MED ORDER — PANTOPRAZOLE SODIUM 40 MG IV SOLR
40.0000 mg | Freq: Every day | INTRAVENOUS | Status: DC
  Administered 2013-07-26 – 2013-07-29 (×4): 40 mg via INTRAVENOUS
  Filled 2013-07-26 (×5): qty 40

## 2013-07-26 MED ORDER — VANCOMYCIN HCL IN DEXTROSE 1-5 GM/200ML-% IV SOLN
1000.0000 mg | Freq: Two times a day (BID) | INTRAVENOUS | Status: DC
  Administered 2013-07-27 – 2013-07-30 (×6): 1000 mg via INTRAVENOUS
  Filled 2013-07-26 (×8): qty 200

## 2013-07-26 MED ORDER — DEXTROSE-NACL 5-0.45 % IV SOLN: INTRAVENOUS | Status: DC

## 2013-07-26 MED ORDER — PIPERACILLIN-TAZOBACTAM 3.375 G IVPB 30 MIN
3.3750 g | Freq: Once | INTRAVENOUS | Status: AC
  Administered 2013-07-26: 3.375 g via INTRAVENOUS
  Filled 2013-07-26: qty 50

## 2013-07-26 MED ORDER — ASPIRIN 81 MG PO CHEW: 324.0000 mg | CHEWABLE_TABLET | ORAL | Status: AC

## 2013-07-26 NOTE — Progress Notes (Signed)
[facility]  Patient Status: New - received referral from MD office prior to scheduled surgery  [facility] is providing the following services: RN  If patient discharges after hours, please call [phone].   [name] 07/26/2013, 5:04 PM

## 2013-07-26 NOTE — ED Notes (Signed)
MD Jeneen Rinks at bedside performing ultrasound for blood. Pt remains alert. IV fluids placed on pressure bag.

## 2013-07-26 NOTE — ED Notes (Signed)
Checked patient cbg it read high I notified RN of high blood sugar

## 2013-07-26 NOTE — Consult Note (Signed)
Cardiology Consult Note Elizabeth Palau, MD No ref. provider found  Reason for consult: Positive troponin, preoperative evaluation.  History of Present Illness (and review of medical records): [name] is a 53 y.o. male who presents for preoperative evaluation and troponin elevation in the setting of Septic Shock, Necrotizing fasciitis, and DKA.  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  He initially presented to the outpatient surgical center for I&D of abscess on back, however was transferred to ED after fall in parking lot getting out of car.  ED evaluation revealed DKA with BG 700, subcutaneous air on CXR c/w necrotizing infectious process, and elevated troponin 7.62.  He has been admitted by PCCM and evaluated by surgery with plans to take to OR for I&D when medically stable.  Limited history is obtainable from patient given altered sensorium.  He denies any current chest pain.  Review of Systems Pertinent items are noted in HPI. Further review of systems was otherwise negative other than stated in HPI.  Patient Active Problem List   Diagnosis Date Noted  . Necrotizing fasciitis 07/26/2013  . Septic shock 07/26/2013  . Metabolic acidosis 78/29/5621  . Infected sebaceous cyst 07/16/2013  . Paroxysmal atrial fibrillation 03/27/2013    Class: Chronic  . Chronic diastolic HF (heart failure) 03/27/2013  . Peripheral edema 07/20/2012  . Obstructive sleep apnea, suspected 06/21/2012  . Pacemaker-St.Jude 02/23/2012  . Type 2 diabetes mellitus with hyperosmolar nonketotic hyperglycemia 08/20/2011  . Acute on chronic renal insufficiency 08/20/2011  . Troponin level elevated 08/20/2011  . Hypertension 08/09/2011  . COPD with asthma 08/09/2011  . Mobitz (type) II atrioventricular block 05/19/2011  . Fabry disease 05/19/2011    Past Medical History  Diagnosis Date  . Second degree Mobitz II AV block     with syncope, s/p PPM  . Hypertension   . Bifascicular block   . Diabetes mellitus   . Pacemaker     02/12/11  . Lymphedema of lower extremity LEFT  >  RIGHT    USES PUMP AT HOME-- DOES NOT WEAR HOSE  . Short of breath on exertion   . History of cellulitis of skin with lymphangitis LEFT LEG  . Diastolic heart failure secondary to hypertrophic cardiomyopathy CARDIOLOGIST-- DR Daneen Schick  . Fabry's disease RENAL AND CARDIAC INVOVLEMENT    FOLLOWED DR [place]  . Chronic renal insufficiency   . Gout, arthritis 11/2011    bil feet. right worse  . Seasonal asthma NO INHALERS    Past Surgical History  Procedure Laterality Date  . Pacemaker insertion  02/12/11    SJM implanted by Dr Rayann Heman for Mobitz II second degree AV block and syncope  . Cardiac catheterization  03-12-2011  DR Daneen Schick    HYPERTROPHIC CARDIOMYOPATHY WITH LV CAVITY  APPEARANCE CONSISTANT WITH SIGNIFICANT APICAL HYPERTROPHY/ NORMAL LVSF / EF 55%/ EVIDENCE OF DIASTOLIC DYSFUNCTION WITH EDP OF 23-27mHg AFTER A-WAVE/ NORMAL CORONARY ARTERIES  . Umbilical hernia repair  03/22/2012    Procedure: HERNIA REPAIR UMBILICAL ADULT;  Surgeon: ALeighton Ruff MD;  Location: [facility];  Service: General;  Laterality: N/A;  Umbilical Hernia Repair   . Hernia repair  [id number]   Umbilical Hernia Repair    Prescriptions prior to admission  Medication Sig Dispense Refill  . allopurinol (ZYLOPRIM) 300 MG tablet Take 300 mg by mouth daily.      .[name] Kitchen  aspirin 81 MG tablet Take 81 mg by mouth daily.       . colchicine (COLCRYS) 0.6 MG tablet Take 0.6 mg by mouth daily.      [name] Kitchen eplerenone (INSPRA) 50 MG tablet Take 50 mg by mouth daily.      [name] Kitchen glyBURIDE (DIABETA) 5 MG tablet Take 5 mg by mouth daily with breakfast.      . losartan-hydrochlorothiazide (HYZAAR) 50-12.5 MG per tablet Take 1 tablet by mouth daily.      [name] Kitchen acetaminophen (TYLENOL) 500 MG tablet Only taking 1  hour before infusion.  AS premed for Fabrazyme      . Agalsidase beta (FABRAZYME IV) Inject into the vein every 14 (fourteen) days.       [name] Kitchen albuterol (PROVENTIL HFA;VENTOLIN HFA) 108 (90 BASE) MCG/ACT inhaler Inhale 2 puffs into the lungs every 6 (six) hours as needed for wheezing.       Allergies  Allergen Reactions  . Shellfish Allergy Hives and Swelling    History  Substance Use Topics  . Smoking status: Never Smoker   . Smokeless tobacco: Never Used  . Alcohol Use: Yes     Comment: OCCASIONAL    History reviewed. No pertinent family history.   Objective:  Patient Vitals for the past 8 hrs:  BP Temp Temp src Pulse Resp SpO2  07/26/13 2100 91/58 mmHg - - 109 29 95 %  07/26/13 2000 106/58 mmHg - - 113 23 97 %  07/26/13 1910 - 98.2 F (36.8 C) Oral - - -  07/26/13 1900 95/60 mmHg - Oral 116 31 96 %  07/26/13 1800 109/54 mmHg - Oral - 38 100 %  07/26/13 1700 91/58 mmHg - Oral 28 43 100 %  07/26/13 1640 91/64 mmHg - Oral 114 28 96 %  07/26/13 1615 105/59 mmHg - - 114 34 97 %  07/26/13 1603 99/57 mmHg - - 115 32 97 %  07/26/13 1550 117/64 mmHg - - 113 - 96 %  07/26/13 1540 102/57 mmHg - - 110 - 96 %  07/26/13 1525 104/63 mmHg - - 110 25 94 %  07/26/13 1515 103/83 mmHg - - 100 27 96 %  07/26/13 1505 109/49 mmHg - - 109 33 94 %  07/26/13 1500 90/52 mmHg - - - 27 -   General appearance: ill appearing, obese male Head: Normocephalic, without obvious abnormality, atraumatic Eyes: conjunctivae/corneas clear. PERRL, EOM's intact.  Neck: TLC Lungs: clear to auscultation anterior ly, no wheezing Chest wall: no tenderness, well-healed pacer site Heart: regular rate and rhythm, S1, S2 normal, no murmurs or rubs detected Abdomen: soft, non-tender; bowel sounds normal; Extremities: extremities normal, atraumatic, BLE edema Pulses: 2+ and symmetric Neurologic: does not answer questions appropriately, slow to respond  Results for orders placed during the hospital encounter of 07/26/13  (from the past 48 hour(s))  COMPREHENSIVE METABOLIC PANEL     Status: Abnormal   Collection Time    07/26/13  1:16 PM      Result Value Ref Range   Sodium 127 (*) 137 - 147 mEq/L   Potassium 5.8 (*) 3.7 - 5.3 mEq/L   Chloride 84 (*) 96 - 112 mEq/L   CO2 12 (*) 19 - 32 mEq/L   Glucose, Bld 1162 (*) 70 - 99 mg/dL   Comment: CRITICAL RESULT CALLED TO, READ BACK BY AND VERIFIED WITH:     HUGHES,D RN @ 1508 07/26/13 LEONARD,A   BUN 81 (*) 6 - 23 mg/dL   Creatinine, Ser  2.69 (*) 0.50 - 1.35 mg/dL   Calcium 9.9  8.4 - 10.5 mg/dL   Total Protein 7.3  6.0 - 8.3 g/dL   Albumin 2.1 (*) 3.5 - 5.2 g/dL   AST 18  0 - 37 U/L   ALT 19  0 - 53 U/L   Alkaline Phosphatase 170 (*) 39 - 117 U/L   Total Bilirubin 0.3  0.3 - 1.2 mg/dL   GFR calc non Af Amer 26 (*) >90 mL/min   GFR calc Af Amer 30 (*) >90 mL/min   Comment: (NOTE)     The eGFR has been calculated using the CKD EPI equation.     This calculation has not been validated in all clinical situations.     eGFR's persistently <90 mL/min signify possible Chronic Kidney     Disease.  Randolm Idol, ED     Status: Abnormal   Collection Time    07/26/13  2:15 PM      Result Value Ref Range   Troponin i, poc 0.29 (*) 0.00 - 0.08 ng/mL   Comment NOTIFIED PHYSICIAN     Comment 3            Comment: Due to the release kinetics of cTnI,     a negative result within the first hours     of the onset of symptoms does not rule out     myocardial infarction with certainty.     If myocardial infarction is still suspected,     repeat the test at appropriate intervals.  I-STAT CHEM 8, ED     Status: Abnormal   Collection Time    07/26/13  2:16 PM      Result Value Ref Range   Sodium 129 (*) 137 - 147 mEq/L   Potassium 5.5 (*) 3.7 - 5.3 mEq/L   Chloride 97  96 - 112 mEq/L   BUN 74 (*) 6 - 23 mg/dL   Creatinine, Ser 2.60 (*) 0.50 - 1.35 mg/dL   Glucose, Bld >700 (*) 70 - 99 mg/dL   Calcium, Ion 1.18  1.12 - 1.23 mmol/L   TCO2 16  0 - 100 mmol/L    Hemoglobin 18.0 (*) 13.0 - 17.0 g/dL   HCT 53.0 (*) 39.0 - 52.0 %   Comment NOTIFIED PHYSICIAN    I-STAT CG4 LACTIC ACID, ED     Status: Abnormal   Collection Time    07/26/13  2:17 PM      Result Value Ref Range   Lactic Acid, Venous 7.25 (*) 0.5 - 2.2 mmol/L  CBG MONITORING, ED     Status: Abnormal   Collection Time    07/26/13  2:26 PM      Result Value Ref Range   Glucose-Capillary >600 (*) 70 - 99 mg/dL  CBG MONITORING, ED     Status: Abnormal   Collection Time    07/26/13  3:03 PM      Result Value Ref Range   Glucose-Capillary >600 (*) 70 - 99 mg/dL   Comment 1 Documented in Chart    TYPE AND SCREEN     Status: None   Collection Time    07/26/13  3:06 PM      Result Value Ref Range   ABO/RH(D) B POS     Antibody Screen NEG     Sample Expiration 07/29/2013    ABO/RH     Status: None   Collection Time    07/26/13  3:06 PM  Result Value Ref Range   ABO/RH(D) B POS    URINALYSIS, ROUTINE W REFLEX MICROSCOPIC     Status: Abnormal   Collection Time    07/26/13  3:16 PM      Result Value Ref Range   Color, Urine YELLOW  YELLOW   APPearance CLOUDY (*) CLEAR   Specific Gravity, Urine 1.022  1.005 - 1.030   pH 5.0  5.0 - 8.0   Glucose, UA >1000 (*) NEGATIVE mg/dL   Hgb urine dipstick MODERATE (*) NEGATIVE   Bilirubin Urine NEGATIVE  NEGATIVE   Ketones, ur NEGATIVE  NEGATIVE mg/dL   Protein, ur NEGATIVE  NEGATIVE mg/dL   Urobilinogen, UA 0.2  0.0 - 1.0 mg/dL   Nitrite NEGATIVE  NEGATIVE   Leukocytes, UA NEGATIVE  NEGATIVE  URINE MICROSCOPIC-ADD ON     Status: None   Collection Time    07/26/13  3:16 PM      Result Value Ref Range   WBC, UA 0-2  <3 WBC/hpf   RBC / HPF 7-10  <3 RBC/hpf   Bacteria, UA RARE  RARE   Urine-Other AMORPHOUS URATES/PHOSPHATES    BASIC METABOLIC PANEL     Status: Abnormal   Collection Time    07/26/13  3:28 PM      Result Value Ref Range   Sodium 130 (*) 137 - 147 mEq/L   Potassium 5.3  3.7 - 5.3 mEq/L   Chloride 92 (*) 96 - 112  mEq/L   CO2 14 (*) 19 - 32 mEq/L   Glucose, Bld 1085 (*) 70 - 99 mg/dL   Comment: REPEATED TO VERIFY     CRITICAL RESULT CALLED TO, READ BACK BY AND VERIFIED WITH:     J DUNLAP,RN 1848 07/26/13 WBOND   BUN 78 (*) 6 - 23 mg/dL   Creatinine, Ser 2.65 (*) 0.50 - 1.35 mg/dL   Calcium 8.5  8.4 - 10.5 mg/dL   GFR calc non Af Amer 26 (*) >90 mL/min   GFR calc Af Amer 30 (*) >90 mL/min   Comment: (NOTE)     The eGFR has been calculated using the CKD EPI equation.     This calculation has not been validated in all clinical situations.     eGFR's persistently <90 mL/min signify possible Chronic Kidney     Disease.  MAGNESIUM     Status: None   Collection Time    07/26/13  3:28 PM      Result Value Ref Range   Magnesium 2.5  1.5 - 2.5 mg/dL  PHOSPHORUS     Status: Abnormal   Collection Time    07/26/13  3:28 PM      Result Value Ref Range   Phosphorus 6.7 (*) 2.3 - 4.6 mg/dL  PROTIME-INR     Status: Abnormal   Collection Time    07/26/13  3:28 PM      Result Value Ref Range   Prothrombin Time 16.3 (*) 11.6 - 15.2 seconds   INR 1.34  0.00 - 1.49  APTT     Status: None   Collection Time    07/26/13  3:28 PM      Result Value Ref Range   aPTT 25  24 - 37 seconds  LACTIC ACID, PLASMA     Status: Abnormal   Collection Time    07/26/13  3:29 PM      Result Value Ref Range   Lactic Acid, Venous 4.9 (*) 0.5 - 2.2 mmol/L  CBC WITH DIFFERENTIAL     Status: Abnormal   Collection Time    07/26/13  4:03 PM      Result Value Ref Range   WBC 23.3 (*) 4.0 - 10.5 K/uL   RBC 4.51  4.22 - 5.81 MIL/uL   Hemoglobin 13.7  13.0 - 17.0 g/dL   Comment: DELTA CHECK NOTED     REPEATED TO VERIFY   HCT 41.0  39.0 - 52.0 %   MCV 90.9  78.0 - 100.0 fL   MCH 30.4  26.0 - 34.0 pg   MCHC 33.4  30.0 - 36.0 g/dL   RDW 13.8  11.5 - 15.5 %   Platelets 228  150 - 400 K/uL   Neutrophils Relative % 88 (*) 43 - 77 %   Lymphocytes Relative 7 (*) 12 - 46 %   Monocytes Relative 5  3 - 12 %   Eosinophils Relative  0  0 - 5 %   Basophils Relative 0  0 - 1 %   Neutro Abs 20.5 (*) 1.7 - 7.7 K/uL   Lymphs Abs 1.6  0.7 - 4.0 K/uL   Monocytes Absolute 1.2 (*) 0.1 - 1.0 K/uL   Eosinophils Absolute 0.0  0.0 - 0.7 K/uL   Basophils Absolute 0.0  0.0 - 0.1 K/uL   WBC Morphology MILD LEFT SHIFT (1-5% METAS, OCC MYELO, OCC BANDS)     Smear Review       Value: PLATELET CLUMPS NOTED ON SMEAR, COUNT APPEARS ADEQUATE  I-STAT ARTERIAL BLOOD GAS, ED     Status: Abnormal   Collection Time    07/26/13  4:12 PM      Result Value Ref Range   pH, Arterial 7.276 (*) 7.350 - 7.450   pCO2 arterial 30.3 (*) 35.0 - 45.0 mmHg   pO2, Arterial 84.0  80.0 - 100.0 mmHg   Bicarbonate 14.1 (*) 20.0 - 24.0 mEq/L   TCO2 15  0 - 100 mmol/L   O2 Saturation 95.0     Acid-base deficit 11.0 (*) 0.0 - 2.0 mmol/L   Collection site RADIAL, ALLEN'S TEST ACCEPTABLE     Drawn by RT     Sample type ARTERIAL    CBG MONITORING, ED     Status: Abnormal   Collection Time    07/26/13  4:31 PM      Result Value Ref Range   Glucose-Capillary >600 (*) 70 - 99 mg/dL  MRSA PCR SCREENING     Status: None   Collection Time    07/26/13  5:06 PM      Result Value Ref Range   MRSA by PCR NEGATIVE  NEGATIVE   Comment:            The GeneXpert MRSA Assay (FDA     approved for NASAL specimens     only), is one component of a     comprehensive MRSA colonization     surveillance program. It is not     intended to diagnose MRSA     infection nor to guide or     monitor treatment for     MRSA infections.  TROPONIN I     Status: Abnormal   Collection Time    07/26/13  8:13 PM      Result Value Ref Range   Troponin I 7.62 (*) <0.30 ng/mL   Comment:            Due to the release kinetics of cTnI,  a negative result within the first hours     of the onset of symptoms does not rule out     myocardial infarction with certainty.     If myocardial infarction is still suspected,     repeat the test at appropriate intervals.     CRITICAL RESULT  CALLED TO, READ BACK BY AND VERIFIED WITH:     C [place] 2132 07/26/13 WBOND  BASIC METABOLIC PANEL     Status: Abnormal   Collection Time    07/26/13  8:13 PM      Result Value Ref Range   Sodium 137  137 - 147 mEq/L   Potassium 4.2  3.7 - 5.3 mEq/L   Chloride 100  96 - 112 mEq/L   CO2 15 (*) 19 - 32 mEq/L   Glucose, Bld 912 (*) 70 - 99 mg/dL   Comment: REPEATED TO VERIFY     CRITICAL RESULT CALLED TO, READ BACK BY AND VERIFIED WITH:     C FORCHA,RN 2135 07/26/13 WBOND   BUN 77 (*) 6 - 23 mg/dL   Creatinine, Ser 2.49 (*) 0.50 - 1.35 mg/dL   Calcium 8.6  8.4 - 10.5 mg/dL   GFR calc non Af Amer 28 (*) >90 mL/min   GFR calc Af Amer 33 (*) >90 mL/min   Comment: (NOTE)     The eGFR has been calculated using the CKD EPI equation.     This calculation has not been validated in all clinical situations.     eGFR's persistently <90 mL/min signify possible Chronic Kidney     Disease.  CARBOXYHEMOGLOBIN     Status: None   Collection Time    07/26/13  9:07 PM      Result Value Ref Range   Total hemoglobin 14.2  13.5 - 18.0 g/dL   O2 Saturation 62.9     Carboxyhemoglobin 1.0  0.5 - 1.5 %   Methemoglobin 0.7  0.0 - 1.5 %   Dg Chest Port 1 View  07/26/2013   CLINICAL DATA:  Check  central line placement  EXAM: PORTABLE CHEST - 1 VIEW  COMPARISON:  07/26/2013 1326 hrs  FINDINGS: Cardiac shadow is stable. A new right jugular central line is noted with the tip at the cavoatrial junction. The lungs are clear bilaterally. No pneumothorax is seen. Bilateral subcutaneous emphysema is noted. A pacing device is again seen. These changes are stable from the prior exam.  IMPRESSION: Status post central line placement without pneumothorax.  Generalized subcutaneous emphysema is again noted particularly on the left side.   Electronically Signed   By: Inez Catalina M.D.   On: 07/26/2013 16:08   Dg Chest Port 1 View  07/26/2013   CLINICAL DATA:  Fall, confusion and abscess of back.  EXAM: PORTABLE CHEST - 1  VIEW  COMPARISON:  DG CHEST 2 VIEW dated 06/12/2012; DG CHEST 2 VIEW dated 08/20/2011  FINDINGS: There is some subcutaneous air at the base of the left neck and extending along the lateral left chest wall. A definite pneumothorax is not identified by portable chest x-ray. Given clinical history, this may represent a necrotizing process with extensive subcutaneous air present. CT evaluation of the neck and chest with contrast would be helpful.  No displaced rib fractures are seen. The heart size and mediastinal contours are stable. There is stable appearance of the dual chamber pacemaker.  IMPRESSION: Subcutaneous air in the left neck and left chest wall. A definite pneumothorax or pneumomediastinum is not  visualized and this may represent a necrotizing infectious process given the history of abscess on the back. Recommend correlation with CT of the neck and chest with contrast.  Critical Value/emergent results were called by telephone at the time of interpretation on 07/26/2013 at 2:08 PM to [employer], PA-C, who verbally acknowledged these results.   Electronically Signed   By: Aletta Edouard M.D.   On: 07/26/2013 14:08    ECG:  Sinus tach HR 109 RBBB, LVH with QRS widening and 2nd repolarization abnormality. Most recent ecgs similar but atrially paced at 60 BPM.  Impression/Recommendations: Troponin elevation- Likely Demand Ischemia Septic Shock Necrotizing Fasciitis DKA Acute encephalopathy Hypertrophic Cardiomyopathy Diastolic CHF Fabry's disease S/p PPM   Mr. Martis is a critically ill patient in need of urgent surgical intervention.  He does have complex cardiac history as stated above.  I suspect that his troponin elevation is most likely secondary to demand ischemia and not acute coronary event.  Patient estimated cardiac risk is intermediate to high given baseline conditions and current clinical status.  However, there is no indication for perioperative ischemic evaluation.  Thus, would  recommend proceed with surgery when clinical status has stabilized.  -Continue aggressive supportive care as per PCCM -Medical management with ASA -PPM Device check -TTE in am -Close attention to intraoperative and postoperative volume status given hypertrophic CMP and Diastolic HF

## 2013-07-26 NOTE — ED Notes (Signed)
CCM at bedside. Dr. Nelda Marseille

## 2013-07-26 NOTE — ED Notes (Signed)
Placed 16 french foley into patient yellow urine in return

## 2013-07-26 NOTE — ED Provider Notes (Signed)
Patient anyway by myself. I have discussed the case with, and examined the patient with Domenic Moras, PA.    Limited bedside ultrasound shows obvious fluid collection under large area of erythema and fluctuance with eschar in the mid back. Chest x-ray shows subcutaneous air to the neck. His labs and consolation and physical and laboratory findings are consistent with subcutaneous abscess and sepsis. He is markedly hyperglycemic hyperosmolar. His lactic acidosis and sepsis. He's been evaluated by general surgery, he is being admitted to the care of the intensive care team. He has had marked fluid resuscitation here with 3 L normal saline.. Condition on discharge is critical. Plan is to the ICU for further resuscitation and able sedation prior to the OR.  Tanna Furry, MD 07/26/13 [phone]

## 2013-07-26 NOTE — ED Provider Notes (Signed)
CSN: [id number]     Arrival date & time 07/26/13  1229 History  This chart was scribed for Domenic Moras, PA working with Tanna Furry, MD by Roxan Diesel, ED Scribe. This patient was seen in room TR10C/TR10C and the patient's care was started at 12:35 PM.   Chief Complaint  Patient presents with  . Fall    The history is provided by the patient. No language interpreter was used.    HPI Comments: [name] is a 53 y.o. male with h/o Fabry's disease, diastolic heart failure, heart block, pacemaker placement, DM, and chronic renal insufficiency brought in by EMS to the Emergency Department complaining of a fall that occurred pta.  Pt was at the [facility] this morning for a 2nd in a series of elective surgeries to remove a sebaceous cyst on his back.  He was exiting his car in the parking lot and when he tried to adjust his flip-flops he fell into a pile of leaves.  He denies head impact or LOC.  He denies injury to any area and currently denies pain to any area except for the cyst on his back.  He denies lightheadedness, dizziness, CP, or SOB prior to his fall.  He does however admit to being generally weak and fatigued recently, but is unable to explain why.  He states he is "slightly off-kilter" when he walks, but "I don't know if it's from the cyst, or some kind of after-effects, or what."  He denies fevers or chills. A nurse notice that he was on the ground, and call EMS to bring him to ER for further evaluation.  History is limited as pt appears lethargic and delirious.      Past Medical History  Diagnosis Date  . Second degree Mobitz II AV block     with syncope, s/p PPM  . Hypertension   . Bifascicular block   . Diabetes mellitus   . Pacemaker     02/12/11  . Lymphedema of lower extremity LEFT  >  RIGHT    USES PUMP AT HOME-- DOES NOT WEAR HOSE  . Short of breath on exertion   . History of cellulitis of skin with lymphangitis LEFT LEG  . Diastolic heart  failure secondary to hypertrophic cardiomyopathy CARDIOLOGIST-- DR Daneen Schick  . Fabry's disease RENAL AND CARDIAC INVOVLEMENT    FOLLOWED DR [place]O  . Chronic renal insufficiency   . Gout, arthritis 11/2011    bil feet. right worse  . Seasonal asthma NO INHALERS    Past Surgical History  Procedure Laterality Date  . Pacemaker insertion  02/12/11    SJM implanted by Dr Rayann Heman for Mobitz II second degree AV block and syncope  . Cardiac catheterization  03-12-2011  DR Daneen Schick    HYPERTROPHIC CARDIOMYOPATHY WITH LV CAVITY  APPEARANCE CONSISTANT WITH SIGNIFICANT APICAL HYPERTROPHY/ NORMAL LVSF / EF 55%/ EVIDENCE OF DIASTOLIC DYSFUNCTION WITH EDP OF 23-70mmHg AFTER A-WAVE/ NORMAL CORONARY ARTERIES  . Umbilical hernia repair  03/22/2012    Procedure: HERNIA REPAIR UMBILICAL ADULT;  Surgeon: Leighton Ruff, MD;  Location: [facility];  Service: General;  Laterality: N/A;  Umbilical Hernia Repair   . Hernia repair  [id number]    Umbilical Hernia Repair    No family history on file.   History  Substance Use Topics  . Smoking status: Never Smoker   . Smokeless tobacco: Never Used  . Alcohol Use: Yes     Comment: OCCASIONAL  Review of Systems  Unable to perform ROS: Mental status change  Constitutional: Positive for fatigue. Negative for fever and chills.  Respiratory: Negative for shortness of breath.   Cardiovascular: Negative for chest pain.  Skin:       Sebaceous cyst on back  Neurological: Positive for weakness. Negative for dizziness and light-headedness.      Allergies  Shellfish allergy  Home Medications   Current Outpatient Rx  Name  Route  Sig  Dispense  Refill  . acetaminophen (TYLENOL) 500 MG tablet      Only taking 1 hour before infusion.  AS premed for Fabrazyme         . Agalsidase beta (FABRAZYME IV)   Intravenous   Inject 1 application into the vein every 14 (fourteen) days.          [name] Kitchen albuterol (PROVENTIL HFA;VENTOLIN HFA) 108 (90 BASE) MCG/ACT  inhaler   Inhalation   Inhale 2 puffs into the lungs every 6 (six) hours as needed for wheezing.         [name] Kitchen allopurinol (ZYLOPRIM) 100 MG tablet   Oral   Take 300 mg by mouth daily.          [name] Kitchen aspirin 81 MG tablet   Oral   Take 81 mg by mouth daily.          . colchicine (COLCRYS) 0.6 MG tablet   Oral   Take 0.6 mg by mouth daily.         [name] Kitchen eplerenone (INSPRA) 50 MG tablet   Oral   Take 50 mg by mouth daily.         . fexofenadine (ALLEGRA) 60 MG tablet   Oral   Take 60 mg by mouth once.         . glyBURIDE (DIABETA) 5 MG tablet   Oral   Take 5 mg by mouth daily with breakfast.         . losartan-hydrochlorothiazide (HYZAAR) 50-12.5 MG per tablet   Oral   Take 1 tablet by mouth daily.          BP 104/75  Pulse 104  Temp(Src) 97.8 F (36.6 C) (Oral)  Resp 22  SpO2 97%  Physical Exam  Nursing note and vitals reviewed. Constitutional: He is oriented to person, place, and time. He appears well-developed and well-nourished. No distress.  Appears drowsy, poor historian  HENT:  Head: Normocephalic and atraumatic.  Mouth/Throat: Mucous membranes are dry.  Mouth is dry with red Kool-Aid stain  Eyes: EOM are normal. Pupils are equal, round, and reactive to light.  Neck: Neck supple. No tracheal deviation present.  No nuchal rigidity  Cardiovascular:  Sinus tach  Pulmonary/Chest: Effort normal. No respiratory distress.  Poor effort, no obvious wheezes/rhonchi/rales  Abdominal: Soft. There is no tenderness.  Musculoskeletal: Normal range of motion.  Neurological: He is alert and oriented to person, place, and time.  Global weakness to all extremities  Able to answer questions appropriately but delay in answering and requiring redirection.    Skin: Skin is warm and dry.  Large area of necrotic escharred skin (11cmx7cm), with surrounding erythema measuring 26x16 cm,   Tender to palpation and fluctuant.    ED Course  Procedures (including critical  care time)  DIAGNOSTIC STUDIES: Oxygen Saturation is 97% on room air, normal by my interpretation.    COORDINATION OF CARE: 12:45 PM-Discussed treatment plan which includes transfer to high-acuity wing for further treatment and evaluation with pt at bedside and  pt agreed to plan.   1:22 PM Patient here with a infected sebaceous cyst on his upper back. He appears to be altered, with global weakness and vital sign to suggest sepsis. Bedside ultrasound reveals an abscess to his upper back. Discussed with attending. Sepsis protocol initiated. Will initiate Vanc/Zosyn along with blood cultures and IV hydration.  I have consult with general surgery who agrees to see patient in ER and requests medicine for admission.  2:16 PM Pt has 26x16cm abscess concerning for necrotizing fasciitis.  CCS has seen, evaluate pt and recommend OR intervention along with medicine admit for further management of his chronic medical problems.  CXR shows subcutaneous air in neck region concerning for possible necrotizing fasciitis.    2:52 PM Patient is hypotensive, tachycardic, and tachypneic despite receiving 2 L of fluid. We'll continue with IV hydration. Patient is hyperglycemic with CBG greater than 700. Anion gap is 16 likely HHS. Potassium is 5.5. Evidence of acute renal failure with BUN of 74, creatinine of 2.6. Blood shows evidence of hyper concentration with hemoglobin of 18. His lactic acid is 7.25. Has elevated trop of 0.29. ECG shows bifascicular block, changed from prior.  There's Peaked t-waves.  Pt will need correction of his K+, but under his condition will continue rehydration.  Foley placed and pt putting out 150cc/hr.  General medicine as evaluate patient and felt patient would need further management through critical care as he is likely requiring ICU stay after surgery.  On recheck, Pt has anion gap of 31, however no ketone in UA.  ABG with pH 7.27 and bicarb of 14.1 consistent with metabolic acidosis.     3:43 PM Critical Care is at bedside and will admit pt.  Pt will go to OR for surgical management. Central line placed by critical care.  Attending is aware and involved in pt care.     CRITICAL CARE Performed by: Domenic Moras Total critical care time: 45 min Critical care time was exclusive of separately billable procedures and treating other patients. Critical care was necessary to treat or prevent imminent or life-threatening deterioration. Critical care was time spent personally by me on the following activities: development of treatment plan with patient and/or surrogate as well as nursing, discussions with consultants, evaluation of patient's response to treatment, examination of patient, obtaining history from patient or surrogate, ordering and performing treatments and interventions, ordering and review of laboratory studies, ordering and review of radiographic studies, pulse oximetry and re-evaluation of patient's condition.   Labs Review Labs Reviewed  COMPREHENSIVE METABOLIC PANEL - Abnormal; Notable for the following:    Sodium 127 (*)    Potassium 5.8 (*)    Chloride 84 (*)    CO2 12 (*)    Glucose, Bld 1162 (*)    BUN 81 (*)    Creatinine, Ser 2.69 (*)    Albumin 2.1 (*)    Alkaline Phosphatase 170 (*)    GFR calc non Af Amer 26 (*)    GFR calc Af Amer 30 (*)    All other components within normal limits  I-STAT CG4 LACTIC ACID, ED - Abnormal; Notable for the following:    Lactic Acid, Venous 7.25 (*)    All other components within normal limits  I-STAT CHEM 8, ED - Abnormal; Notable for the following:    Sodium 129 (*)    Potassium 5.5 (*)    BUN 74 (*)    Creatinine, Ser 2.60 (*)    Glucose, Bld >700 (*)  Hemoglobin 18.0 (*)    HCT 53.0 (*)    All other components within normal limits  I-STAT TROPOININ, ED - Abnormal; Notable for the following:    Troponin i, poc 0.29 (*)    All other components within normal limits  CBG MONITORING, ED - Abnormal; Notable  for the following:    Glucose-Capillary >600 (*)    All other components within normal limits  CULTURE, BLOOD (ROUTINE X 2)  CULTURE, BLOOD (ROUTINE X 2)  URINE CULTURE  CULTURE, BLOOD (ROUTINE X 2)  CULTURE, BLOOD (ROUTINE X 2)  URINE CULTURE  CBC WITH DIFFERENTIAL  URINALYSIS, ROUTINE W REFLEX MICROSCOPIC  BLOOD GAS, ARTERIAL  BASIC METABOLIC PANEL  MAGNESIUM  PHOSPHORUS  LACTIC ACID, PLASMA  CORTISOL  CBC  PROTIME-INR  APTT  BLOOD GAS, ARTERIAL  TYPE AND SCREEN    Imaging Review Dg Chest Port 1 View  07/26/2013   CLINICAL DATA:  Fall, confusion and abscess of back.  EXAM: PORTABLE CHEST - 1 VIEW  COMPARISON:  DG CHEST 2 VIEW dated 06/12/2012; DG CHEST 2 VIEW dated 08/20/2011  FINDINGS: There is some subcutaneous air at the base of the left neck and extending along the lateral left chest wall. A definite pneumothorax is not identified by portable chest x-ray. Given clinical history, this may represent a necrotizing process with extensive subcutaneous air present. CT evaluation of the neck and chest with contrast would be helpful.  No displaced rib fractures are seen. The heart size and mediastinal contours are stable. There is stable appearance of the dual chamber pacemaker.  IMPRESSION: Subcutaneous air in the left neck and left chest wall. A definite pneumothorax or pneumomediastinum is not visualized and this may represent a necrotizing infectious process given the history of abscess on the back. Recommend correlation with CT of the neck and chest with contrast.  Critical Value/emergent results were called by telephone at the time of interpretation on 07/26/2013 at 2:08 PM to [facility], PA-C, who verbally acknowledged these results.   Electronically Signed   By: Aletta Edouard M.D.   On: 07/26/2013 14:08    EKG Interpretation   None       MDM   Final diagnoses:  Infected sebaceous cyst  Severe sepsis with acute organ dysfunction  Hyperosmolar non-ketotic state in patient  with type 2 diabetes mellitus  Necrotizing fasciitis    BP 104/63  Pulse 110  Temp(Src) 97.8 F (36.6 C) (Oral)  Resp 25  SpO2 94%   Date: 07/26/2013  Rate: 103  Rhythm: sinus tachycardia  QRS Axis: left  Intervals: QT prolonged  ST/T Wave abnormalities: nonspecific ST/T changes  Conduction Disutrbances:right bundle branch block and left anterior fascicular block  Narrative Interpretation:   Old EKG Reviewed: none available    I have reviewed nursing notes and vital signs. I personally reviewed the imaging tests through PACS system  I reviewed available ER/hospitalization records thought the EMR   I personally performed the services described in this documentation, which was scribed in my presence. The recorded information has been reviewed and is accurate.     Domenic Moras, PA-C 07/26/13 [place], PA-C 07/26/13 [phone]

## 2013-07-26 NOTE — ED Notes (Signed)
ATTEMPTED REPORT X2 

## 2013-07-26 NOTE — ED Notes (Signed)
Results of chem 8 and lactic acid given to Dr. Curly Rim

## 2013-07-26 NOTE — ED Notes (Signed)
ATTEMPTED REPORT X1 

## 2013-07-26 NOTE — Consult Note (Signed)
[name] September 02, 1960  [id number].    Requesting MD: Dr. Tanna Furry PCP: Dr. Ellsworth Lennox Cards: Dr. Daneen Schick Renal: Dr. Katrine Coho Chief Complaint/Reason for Consult: Back abscess  HPI: This is a 53 yo black male with Fabry's disease who has multiple kidney and heart issues secondary to this.  He also has DM, which his mother and him deny.  Apparently he has had an area on his back for the last several months felt to be secondary to a sebaceous cyst.  He saw Dr. Marlou Starks in our office on Friday at which time, it was not infected and was small in size.  He was set up for excision today.  However, he did not arrive for his surgery until 1.5 hrs late.  He stumbled from his car and fell in the bushes.  He has AMS with lethargy and can't provide much history to determine when he began feeling poorly.  EMS brought him to [facility] where we were asked to se him for a large back abscess.  ROS: Please see HPI, otherwise difficult to obtain due to decreased mental status.  No family history on file.  Past Medical History  Diagnosis Date  . Second degree Mobitz II AV block     with syncope, s/p PPM  . Hypertension   . Bifascicular block   . Diabetes mellitus   . Pacemaker     02/12/11  . Lymphedema of lower extremity LEFT  >  RIGHT    USES PUMP AT HOME-- DOES NOT WEAR HOSE  . Short of breath on exertion   . History of cellulitis of skin with lymphangitis LEFT LEG  . Diastolic heart failure secondary to hypertrophic cardiomyopathy CARDIOLOGIST-- DR Daneen Schick  . Fabry's disease RENAL AND CARDIAC INVOVLEMENT    FOLLOWED DR [place]  . Chronic renal insufficiency   . Gout, arthritis 11/2011    bil feet. right worse  . Seasonal asthma NO INHALERS    Past Surgical History  Procedure Laterality Date  . Pacemaker insertion  02/12/11    SJM implanted by Dr Rayann Heman for Mobitz II second degree AV block and syncope  . Cardiac catheterization  03-12-2011  DR Daneen Schick    HYPERTROPHIC  CARDIOMYOPATHY WITH LV CAVITY  APPEARANCE CONSISTANT WITH SIGNIFICANT APICAL HYPERTROPHY/ NORMAL LVSF / EF 55%/ EVIDENCE OF DIASTOLIC DYSFUNCTION WITH EDP OF 23-68mmHg AFTER A-WAVE/ NORMAL CORONARY ARTERIES  . Umbilical hernia repair  03/22/2012    Procedure: HERNIA REPAIR UMBILICAL ADULT;  Surgeon: Leighton Ruff, MD;  Location: [facility];  Service: General;  Laterality: N/A;  Umbilical Hernia Repair   . Hernia repair  0000000    Umbilical Hernia Repair    Social History:  reports that he has never smoked. He has never used smokeless tobacco. He reports that he drinks alcohol. He reports that he does not use illicit drugs.  Allergies:  Allergies  Allergen Reactions  . Shellfish Allergy Hives and Swelling     (Not in a hospital admission)  Blood pressure 104/75, pulse 104, temperature 97.8 F (36.6 C), temperature source Oral, resp. rate 22, SpO2 97.00%. Physical Exam: General: obese black male who is lethargic but appears in NAD HEENT: head is normocephalic, atraumatic.  Sclera are noninjected.  PERRL.  Ears and nose without any masses or lesions.  Mouth is dry and stained red from [place]: regular rhythm, paced, tachy.  No obvious murmurs, gallops, or rubs noted.  Palpable pedal pulses bilaterally Lungs: CTAB, no wheezes, rhonchi, or rales  noted.  Tachypnic, but nonlarbored Abd: soft, NT/ND, +BS, no masses, hernias, or organomegaly MS: all 4 extremities are symmetrical with no cyanosis, clubbing, or edema. Skin: warm and dry, large 26x16cm area of erythema fluctuance on his mid/left back with a central area of necrosis that is 11x7cm (see picture below)  Skin above this is relatively soft/mushy and concerning, but no erythema is noted.  No crepitus is palpable Psych: lethargic with decreased mental status   Results for orders placed during the hospital encounter of 07/26/13 (from the past 48 hour(s))  I-STAT TROPOININ, ED     Status: Abnormal   Collection Time    07/26/13  2:15 PM       Result Value Ref Range   Troponin i, poc 0.29 (*) 0.00 - 0.08 ng/mL   Comment NOTIFIED PHYSICIAN     Comment 3            Comment: Due to the release kinetics of cTnI,     a negative result within the first hours     of the onset of symptoms does not rule out     myocardial infarction with certainty.     If myocardial infarction is still suspected,     repeat the test at appropriate intervals.  I-STAT CHEM 8, ED     Status: Abnormal   Collection Time    07/26/13  2:16 PM      Result Value Ref Range   Sodium 129 (*) 137 - 147 mEq/L   Potassium 5.5 (*) 3.7 - 5.3 mEq/L   Chloride 97  96 - 112 mEq/L   BUN 74 (*) 6 - 23 mg/dL   Creatinine, Ser 2.60 (*) 0.50 - 1.35 mg/dL   Glucose, Bld >700 (*) 70 - 99 mg/dL   Calcium, Ion 1.18  1.12 - 1.23 mmol/L   TCO2 16  0 - 100 mmol/L   Hemoglobin 18.0 (*) 13.0 - 17.0 g/dL   HCT 53.0 (*) 39.0 - 52.0 %   Comment NOTIFIED PHYSICIAN    I-STAT CG4 LACTIC ACID, ED     Status: Abnormal   Collection Time    07/26/13  2:17 PM      Result Value Ref Range   Lactic Acid, Venous 7.25 (*) 0.5 - 2.2 mmol/L  CBG MONITORING, ED     Status: Abnormal   Collection Time    07/26/13  2:26 PM      Result Value Ref Range   Glucose-Capillary >600 (*) 70 - 99 mg/dL   Dg Chest Port 1 View  07/26/2013   CLINICAL DATA:  Fall, confusion and abscess of back.  EXAM: PORTABLE CHEST - 1 VIEW  COMPARISON:  DG CHEST 2 VIEW dated 06/12/2012; DG CHEST 2 VIEW dated 08/20/2011  FINDINGS: There is some subcutaneous air at the base of the left neck and extending along the lateral left chest wall. A definite pneumothorax is not identified by portable chest x-ray. Given clinical history, this may represent a necrotizing process with extensive subcutaneous air present. CT evaluation of the neck and chest with contrast would be helpful.  No displaced rib fractures are seen. The heart size and mediastinal contours are stable. There is stable appearance of the dual chamber pacemaker.   IMPRESSION: Subcutaneous air in the left neck and left chest wall. A definite pneumothorax or pneumomediastinum is not visualized and this may represent a necrotizing infectious process given the history of abscess on the back. Recommend correlation with CT of the neck and  chest with contrast.  Critical Value/emergent results were called by telephone at the time of interpretation on 07/26/2013 at 2:08 PM to [facility], PA-C, who verbally acknowledged these results.   Electronically Signed   By: Aletta Edouard M.D.   On: 07/26/2013 14:08    Necrotic center is 7cm x 11cm Erythema extends 26cm x 16cm    Assessment/Plan 1. Septic shock 2. DKA 3. Large back abscess with concern for necrotizing fasciitis 4. DM 5. Fabry's disease Patient Active Problem List   Diagnosis Date Noted  . Infected sebaceous cyst 07/16/2013  . Paroxysmal atrial fibrillation 03/27/2013    Class: Chronic  . Chronic diastolic HF (heart failure) 03/27/2013  . Peripheral edema 07/20/2012  . Obstructive sleep apnea, suspected 06/21/2012  . Pacemaker-St.Jude 02/23/2012  . Type 2 diabetes mellitus with hyperosmolar nonketotic hyperglycemia 08/20/2011  . Acute on chronic renal insufficiency 08/20/2011  . Troponin level elevated 08/20/2011  . Hypertension 08/09/2011  . COPD with asthma 08/09/2011  . Mobitz (type) II atrioventricular block 05/19/2011  . Fabry disease 05/19/2011   Plan: 1. We have asked CCM to evaluate the patient for admission given his multitude of medical problems. Once he is resuscitated to their acceptance we will plan to take him to the OR for incision and debridement of his back.  I suspect he has a massive abscess with necrotizing fasciitis.  He has air in his neck on his chest x-ray.   2. Defer management of DKA and septic shock to CCM.  When patient is medically stable we will proceed with OR. 3. Agree with Vanc and zosyn.

## 2013-07-26 NOTE — Progress Notes (Addendum)
eLink Physician-Brief Progress Note Patient Name: [name] DOB: Feb 22, 1961 MRN: [id number]  Date of Service  07/26/2013   HPI/Events of Note  Source: necrotizing fasciitis Abx: vanc/zosyn/clinda Hemodynamics:  Filed Vitals:   07/26/13 1700 07/26/13 1800 07/26/13 1900 07/26/13 1910  BP: 91/58 109/54 95/60   Pulse: 28  116   Temp:    98.2 F (36.8 C)  TempSrc: Oral Oral Oral Oral  Resp: 43 38 31   SpO2: 100% 100% 96%     CVP:  [3 mmHg-6 mmHg] 3 mmHg Vasopressors: none Lactic acid: 7.25 > now down tot 4.9 Coox: none Stress dose steroids:     eICU Interventions  Bolus saline now [employer] BMET now Discussed with surgery, plan OR once blood sugar control better (may be first thing in morning 2/20)   Intervention Category Major Interventions: Shock - evaluation and management;Hyperglycemia - active titration of insulin therapy;Infection - evaluation and management  MCQUAID, DOUGLAS 07/26/2013, 8:14 PM

## 2013-07-26 NOTE — Progress Notes (Addendum)
ANTIBIOTIC CONSULT NOTE - INITIAL  Pharmacy Consult for Vancomycin, Zosyn Indication: cellulitis  Allergies  Allergen Reactions  . Shellfish Allergy Hives and Swelling    Patient Measurements: Weight = 120 kg  Medical History: Past Medical History  Diagnosis Date  . Second degree Mobitz II AV block     with syncope, s/p PPM  . Hypertension   . Bifascicular block   . Diabetes mellitus   . Pacemaker     02/12/11  . Lymphedema of lower extremity LEFT  >  RIGHT    USES PUMP AT HOME-- DOES NOT WEAR HOSE  . Short of breath on exertion   . History of cellulitis of skin with lymphangitis LEFT LEG  . Diastolic heart failure secondary to hypertrophic cardiomyopathy CARDIOLOGIST-- DR Daneen Schick  . Fabry's disease RENAL AND CARDIAC INVOVLEMENT    FOLLOWED DR [place]  . Chronic renal insufficiency   . Gout, arthritis 11/2011    bil feet. right worse  . Seasonal asthma NO INHALERS    Assessment: 53 year old obese male beginning antibiotics for cellulitis, rule out sepsis  He has had a cyst on his back for about the last 6 to 8 months.  Scr = 1.36  Received a dose of both vancomycin and Zosyn at 1330  pm  Goal of Therapy:  Vancomycin trough level 10-15 mcg/ml  Plan:  1) Zosyn 3.375 grams iv Q 8 hours - 4 hr infusion 2) Vancomycin 500 mg iv x 1 now, then 1 Gram iv Q 12 hours 3) Follow up Scr, cultures, fever trend, progress  Thank you. Anette Guarneri, PharmD [phone]  07/26/2013,1:23 PM  UPDATE 53 year old male, 120 kg, Clindamycin has been added, will give patient 600 mg IV q8h.  Continue to follow cultures and clinical progress.  Hughes Better, PharmD, BCPS Clinical Pharmacist Pager: [phone] 07/26/2013 3:59 PM

## 2013-07-26 NOTE — ED Notes (Signed)
Received insulin and vancomycin from pharmacy

## 2013-07-26 NOTE — ED Notes (Signed)
Georgann Housekeeper, NP and Dr. Nelda Marseille at beside assisting with central line placement, timeout done. Pt currently alert.

## 2013-07-26 NOTE — Procedures (Signed)
Arterial Catheter Insertion Procedure Note PIO STADTLANDER [id number] May 26, 1961  Procedure: Insertion of Arterial Catheter  Indications: Blood pressure monitoring and Frequent blood sampling  Procedure Details Consent: Risks of procedure as well as the alternatives and risks of each were explained to the (patient/caregiver).  Consent for procedure obtained. Time Out: Verified patient identification, verified procedure, site/side was marked, verified correct patient position, special equipment/implants available, medications/allergies/relevent history reviewed, required imaging and test results available.  Performed  Maximum sterile technique was used including antiseptics, cap, gloves, gown, hand hygiene, mask and sheet. Skin prep: Chlorhexidine; local anesthetic administered 20 gauge catheter was inserted into left radial artery using the Seldinger technique.  Evaluation Blood flow good; BP tracing good. Complications: No apparent complications.  I performed the procedure.  Rush Farmer, M.D. [facility] Pulmonary/Critical Care Medicine. Pager: [phone]. After hours pager: [phone].

## 2013-07-26 NOTE — Progress Notes (Signed)
eLink Physician-Brief Progress Note Patient Name: [name] DOB: [birth year] MRN: [id number]  Date of Service  07/26/2013   HPI/Events of Note   Labs reviewed, glucose still out Cr slightly better Troponin 7.6 > suspect demand ischemia from sepsis EKG with profound LVH  eICU Interventions  Will ask cardiology to see given need to go to OR soon; suspect that cardiac risk is outweighed by potential benefit given nec fasc Will start heparin for now   Intervention Category Major Interventions: Sepsis - evaluation and management;Other: (coronary ischemia)  MCQUAID, DOUGLAS 07/26/2013, 9:57 PM

## 2013-07-26 NOTE — H&P (Signed)
Name: [name] MRN: [id number] DOB: 20-Aug-1960    ADMISSION DATE:  07/26/2013  REFERRING MD :  [name]. Jeneen [name] PRIMARY SERVICE:  PCCM  CHIEF COMPLAINT:  Necrotizing Fasciitis   BRIEF PATIENT DESCRIPTION: 53 y/o M admitted to [facility] with findings of necrotizing fasciitis of L neck.    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  He was initially seen at [place] office on 2/13 for an I&D of an abscess on his L upper posterior back.  The area was felt to be too large to drain in the office and he was scheduled for outpatient surgical center on 2/19.  Patient was greater than 1.5 hours late for surgical center and then fell getting out of his car into the shrubs leading to transfer to [facility] ER via EMS.    ER work up demonstrated DKA with glucose greater than 700, hyponatremia, hyperkalemia, acute on chronic kidney injury, elevated troponin and CXR with gas in L neck (without ptx).  PCCM consulted for ICU admission.   PAST MEDICAL HISTORY :  Past Medical History  Diagnosis Date  . Second degree Mobitz II AV block     with syncope, s/p PPM  . Hypertension   . Bifascicular block   . Diabetes mellitus   . Pacemaker     02/12/11  . Lymphedema of lower extremity LEFT  >  RIGHT    USES PUMP AT HOME-- DOES NOT WEAR HOSE  . Short of breath on exertion   . History of cellulitis of skin with lymphangitis LEFT LEG  . Diastolic heart failure secondary to hypertrophic cardiomyopathy CARDIOLOGIST-- [name] [name]  . Fabry's disease RENAL AND CARDIAC INVOVLEMENT    FOLLOWED [name]  [name]  . Chronic renal insufficiency   . Gout, arthritis 11/2011    bil feet. right worse  . Seasonal asthma NO INHALERS   Past Surgical History  Procedure Laterality Date  . Pacemaker insertion  02/12/11    SJM implanted by [name] for Mobitz II second degree AV block and syncope  . Cardiac catheterization  03-12-2011  [name] [name]    HYPERTROPHIC CARDIOMYOPATHY WITH LV CAVITY  APPEARANCE CONSISTANT WITH SIGNIFICANT APICAL HYPERTROPHY/ NORMAL LVSF / EF 55%/ EVIDENCE OF DIASTOLIC DYSFUNCTION WITH EDP OF 23-53mmHg AFTER A-WAVE/ NORMAL CORONARY ARTERIES  . Umbilical hernia repair  03/22/2012    Procedure: HERNIA REPAIR UMBILICAL ADULT;  Surgeon: [name] Ruff, MD;  Location: [facility] ORS;  Service: General;  Laterality: N/A;  Umbilical Hernia Repair   . Hernia repair  0000000    Umbilical Hernia Repair   Prior to Admission medications   Medication Sig Start Date End Date Taking? Authorizing Provider  allopurinol (ZYLOPRIM) 300 MG tablet Take 300 mg by mouth daily.   Yes Historical Provider, MD  aspirin 81 MG tablet Take 81 mg by mouth daily.    Yes Historical Provider, MD  colchicine (COLCRYS) 0.6 MG tablet Take 0.6 mg by mouth daily.   Yes Historical Provider, MD  eplerenone (INSPRA) 50 MG tablet Take 50 mg by  mouth daily. 05/08/12  Yes Historical Provider, MD  glyBURIDE (DIABETA) 5 MG tablet Take 5 mg by mouth daily with breakfast.   Yes Historical Provider, MD  losartan-hydrochlorothiazide (HYZAAR) 50-12.5 MG per tablet Take 1 tablet by mouth daily.   Yes Historical Provider, MD  acetaminophen (TYLENOL) 500 MG tablet Only taking 1 hour before infusion.  AS premed for Fabrazyme    Historical Provider, MD  Agalsidase beta (FABRAZYME IV) Inject into the vein every 14 (fourteen) days.     Historical Provider, MD  albuterol (PROVENTIL HFA;VENTOLIN HFA) 108 (90 BASE) MCG/ACT inhaler Inhale 2 puffs into the lungs every 6 (six) hours as needed for wheezing.    Historical Provider, MD   Allergies    Allergen Reactions  . Shellfish Allergy Hives and Swelling    FAMILY HISTORY:  History reviewed. No pertinent family history.  SOCIAL HISTORY:  reports that he has never smoked. He has never used smokeless tobacco. He reports that he drinks alcohol. He reports that he does not use illicit drugs.  REVIEW OF SYSTEMS:   Gen: Denies fever, chills, weight change, fatigue, night sweats HEENT: Denies blurred vision, double vision, hearing loss, tinnitus, sinus congestion, rhinorrhea, sore throat, neck stiffness, dysphagia PULM: Denies shortness of breath, cough, sputum production, hemoptysis, wheezing CV: Denies chest pain, edema, orthopnea, paroxysmal nocturnal dyspnea, palpitations GI: Denies abdominal pain, nausea, vomiting, diarrhea, hematochezia, melena, constipation, change in bowel habits GU: Denies dysuria, hematuria, polyuria, oliguria, urethral discharge Endocrine: Denies hot or cold intolerance, polyuria, polyphagia or appetite change Derm: Denies rash, dry skin, scaling or peeling skin change Heme: Denies easy bruising, bleeding, bleeding gums Neuro: Denies headache, numbness, weakness, slurred speech, loss of memory or consciousness.  Reports back pain.     SUBJECTIVE:   VITAL SIGNS: Temp:  [97.8 F (36.6 C)] 97.8 F (36.6 C) (02/19 1256) Pulse Rate:  [104-112] 109 (02/19 1505) Resp:  [22-33] 33 (02/19 1505) BP: (90-109)/(49-75) 109/49 mmHg (02/19 1505) SpO2:  [93 %-97 %] 94 % (02/19 1505) HEMODYNAMICS:   VENTILATOR SETTINGS:   INTAKE / OUTPUT: Intake/Output     02/18 0701 - 02/19 0700 02/19 0701 - 02/20 0700   I.V.  1000   Total Intake   1000   Net   +1000          PHYSICAL EXAMINATION: General:  Morbidly obese male lying on stretcher  Neuro:  Lethargic but arouses, awake, MAE but weak HEENT:  Mm dry, pink, no jvd, mouth stained with red flavored drink Cardiovascular:  s1s2 rrr, no m/r/g Lungs:  resp's even/non-labored, lungs bilaterally coarse Abdomen:   Round/soft, bsx4 active Musculoskeletal:  No acute deformities Skin:  Warm/dry, B LE edema  LABS:  CBC  Recent Labs Lab 07/26/13 1416  HGB 18.0*  HCT 53.0*   Coag's No results found for this basename: APTT, INR,  in the last 168 hours BMET  Recent Labs Lab 07/26/13 1316 07/26/13 1416  NA 127* 129*  K 5.8* 5.5*  CL 84* 97  CO2 12*  --   BUN 81* 74*  CREATININE 2.69* 2.60*  GLUCOSE 1162* >700*   Electrolytes  Recent Labs Lab 07/26/13 1316  CALCIUM 9.9   Sepsis Markers  Recent Labs Lab 07/26/13 1417  LATICACIDVEN 7.25*   ABG No results found for this basename: PHART, PCO2ART, PO2ART,  in the last 168 hours Liver Enzymes  Recent Labs Lab 07/26/13 1316  AST 18  ALT 19  ALKPHOS 170*  BILITOT 0.3  ALBUMIN 2.1*  Cardiac Enzymes No results found for this basename: TROPONINI, PROBNP,  in the last 168 hours Glucose  Recent Labs Lab 07/26/13 1426  GLUCAP >600*    Imaging Dg Chest Port 1 View  07/26/2013   CLINICAL DATA:  Fall, confusion and abscess of back.  EXAM: PORTABLE CHEST - 1 VIEW  COMPARISON:  DG CHEST 2 VIEW dated 06/12/2012; DG CHEST 2 VIEW dated 08/20/2011  FINDINGS: There is some subcutaneous air at the base of the left neck and extending along the lateral left chest wall. A definite pneumothorax is not identified by portable chest x-ray. Given clinical history, this may represent a necrotizing process with extensive subcutaneous air present. CT evaluation of the neck and chest with contrast would be helpful.  No displaced rib fractures are seen. The heart size and mediastinal contours are stable. There is stable appearance of the dual chamber pacemaker.  IMPRESSION: Subcutaneous air in the left neck and left chest wall. A definite pneumothorax or pneumomediastinum is not visualized and this may represent a necrotizing infectious process given the history of abscess on the back. Recommend correlation with CT of the neck and chest with contrast.   Critical Value/emergent results were called by telephone at the time of interpretation on 07/26/2013 at 2:08 PM to [facility], PA-C, who verbally acknowledged these results.   Electronically Signed   By: Aletta Edouard M.D.   On: 07/26/2013 14:08    ASSESSMENT / PLAN:  PULMONARY A: Likely will require mechanical vent post debridement P:   -supplemental O2 to keep sats >92% -trend cxr -pulmonary hygiene  CARDIOVASCULAR A:  Septic Shock Diastolic CHF / Hypertrophic Cardiomyopathy HTN Mobitz II Hx Lymphedema LE's P:  -likely will require EGDT post surgery -see ID -assess CVP -assess cortisol, repeat lactate -3L in ER NS then 125 /hr -trend cardiac enzymes -assess EKG  RENAL A:   Lactic Acidosis Fabry's Disease Chronic Renal Insufficiency - followed by [name]. Myrtie Hawk P:   -trend BMP -reassess lactic at1800  GASTROINTESTINAL A:   Morbid Obesity P:   -NPO -will need PPI post surgery while on vent  HEMATOLOGIC A:   DVT Proph  P:  -SCD's -hold sq heparin until decision made regarding surgical plans  INFECTIOUS A:   Necrotizing Fasciitis  LU Posterior Abscess P:   -CCS consult for surgical debridement -abx as above -follow cultures  ENDOCRINE A:   DM Gout  P:   -IV insulin gtt with hyperglycemia protocol  NEUROLOGIC A:   Pain P:   -PRN fentanyl  Noe Gens, NP-C [facility] Pgr: [phone] or [phone]  Admit to the ICU, stabilize from hemodynamic and metabolic standpoint.  If acidosis remains an issue then will consider a bicarb drip but for now will start insulin drip and hydrate.  Once more stable then surgery can take to the OR.  Anticipate patient will be much sicker post op and will likely need pressors...etc.  TLC was placed.  I have personally obtained a history, examined the patient, evaluated laboratory and imaging results, formulated the assessment and plan and placed orders.  CRITICAL CARE: The patient is  critically ill with multiple organ systems failure and requires high complexity decision making for assessment and support, frequent evaluation and titration of therapies, application of advanced monitoring technologies and extensive interpretation of multiple databases. Critical Care Time devoted to patient care services described in this note is 45 minutes.   07/26/2013, 3:25 PM Rush Farmer, M.D. [facility] Pulmonary/Critical Care Medicine. Pager:  [phone]. After hours pager: [phone].

## 2013-07-26 NOTE — Progress Notes (Signed)
ANTICOAGULATION CONSULT NOTE - Initial Consult  Pharmacy Consult for Heparin Indication: chest pain/ACS  Allergies  Allergen Reactions  . Shellfish Allergy Hives and Swelling    Patient Measurements:  Weight 120.3 kg at 07/20/13 Height 175cm at 07/20/13 Heparin Dosing Weight: 97.3 kg  Vital Signs: Temp: 98.2 F (36.8 C) (02/19 1910) Temp src: Oral (02/19 1910) BP: 91/58 mmHg (02/19 2100) Pulse Rate: 109 (02/19 2100) Labs:  Recent Labs  07/26/13 1416 07/26/13 1528 07/26/13 1603 07/26/13 2013  HGB 18.0*  --  13.7  --   HCT 53.0*  --  41.0  --   PLT  --   --  228  --   APTT  --  25  --   --   LABPROT  --  16.3*  --   --   INR  --  1.34  --   --   CREATININE 2.60* 2.65*  --  2.49*  TROPONINI  --   --   --  7.62*   The CrCl is unknown because both a height and weight (above a minimum accepted value) are required for this calculation.  Medical History: Past Medical History  Diagnosis Date  . Second degree Mobitz II AV block     with syncope, s/p PPM  . Hypertension   . Bifascicular block   . Diabetes mellitus   . Pacemaker     02/12/11  . Lymphedema of lower extremity LEFT  >  RIGHT    USES PUMP AT HOME-- DOES NOT WEAR HOSE  . Short of breath on exertion   . History of cellulitis of skin with lymphangitis LEFT LEG  . Diastolic heart failure secondary to hypertrophic cardiomyopathy CARDIOLOGIST-- DR Daneen Schick  . Fabry's disease RENAL AND CARDIAC INVOVLEMENT    FOLLOWED DR [place]  . Chronic renal insufficiency   . Gout, arthritis 11/2011    bil feet. right worse  . Seasonal asthma NO INHALERS   Assessment: 35 YOM with elevated troponin (7.6) suspected demand ischemia from sepsis however, starting IV heparin and consulting cardiology. INR today is 1.34. Patient was not on anticoagulants prior to admission. H/H are within normal limits. Plts are 228. SCr elevated at 2.49 /estimated CrCl~50-39mL/min.  Goal of Therapy:  Heparin level 0.3-0.7 units/ml Monitor  platelets by anticoagulation protocol: Yes   Plan:  1. Heparin bolus of 4000 units x1.  2. Heparin drip at 1200 units/hr.  3. Heparin level in 6 hours. 4. Daily heparin level and CBC while on therapy.  Sloan Leiter, PharmD, BCPS Clinical Pharmacist [phone] 07/26/2013,10:00 PM

## 2013-07-26 NOTE — Consult Note (Signed)
I saw the patient, participated in the history, exam and medical decision making, and concur with the physician assistant's note above.  Large infected epidermoid inclusion cyst with extensive cellulitis and fluctuance. No crepitus  Needs to be medically stabilized before OR Discussed with CCM attending  Leighton Ruff. Redmond Pulling, MD, FACS General, Bariatric, & Minimally Invasive Surgery [facility], [place]

## 2013-07-26 NOTE — Progress Notes (Signed)
CRITICAL VALUE ALERT  Critical value received:  CBG 1085   Date of notification: 07/26/2013  Time of notification:  1853  Critical value read back:yes  Nurse who received alert:  Sanjuana Kava  MD notified (1st page):  Dr. Lake Bells   Time of first page:  1853  MD notified (2nd page):  Time of second page:  Responding MD:  Dr. Lake Bells  Time MD responded:  [phone]

## 2013-07-26 NOTE — ED Notes (Signed)
[facility]-Pt was getting out of car at surgical center of [place] when fell while adjusting his shoe.  sts no loc.  Denies injury at this time.

## 2013-07-26 NOTE — Procedures (Signed)
Central Venous Catheter Insertion Procedure Note [name] [id number] Jul 06, 1960  Procedure: Insertion of Central Venous Catheter Indications: Assessment of intravascular volume, Drug and/or fluid administration and Frequent blood sampling  Procedure Details Consent: Risks of procedure as well as the alternatives and risks of each were explained to the (patient/caregiver).  Consent for procedure obtained. Time Out: Verified patient identification, verified procedure, site/side was marked, verified correct patient position, special equipment/implants available, medications/allergies/relevent history reviewed, required imaging and test results available.  Performed  Maximum sterile technique was used including antiseptics, cap, gloves, gown, hand hygiene, mask and sheet. Skin prep: Chlorhexidine; local anesthetic administered A antimicrobial bonded/coated triple lumen catheter was placed in the right internal jugular vein using the Seldinger technique.  Evaluation Blood flow good Complications: No apparent complications Patient did tolerate procedure well. Chest X-ray ordered to verify placement.  CXR: pending.  Procedure performed by Georgann Housekeeper, NP  I was present and supervised the entire procedure.  U/S used in placement.  Rush Farmer, M.D. [facility] Pulmonary/Critical Care Medicine. Pager: [phone]. After hours pager: [phone].

## 2013-07-27 ENCOUNTER — Inpatient Hospital Stay (HOSPITAL_COMMUNITY): Payer: Medicare Other | Admitting: Anesthesiology

## 2013-07-27 ENCOUNTER — Other Ambulatory Visit (INDEPENDENT_AMBULATORY_CARE_PROVIDER_SITE_OTHER): Payer: Self-pay | Admitting: General Surgery

## 2013-07-27 ENCOUNTER — Inpatient Hospital Stay (HOSPITAL_COMMUNITY): Payer: Medicare Other

## 2013-07-27 ENCOUNTER — Encounter (HOSPITAL_COMMUNITY): Payer: Medicare Other | Admitting: Anesthesiology

## 2013-07-27 ENCOUNTER — Encounter (HOSPITAL_COMMUNITY): Payer: Self-pay | Admitting: Anesthesiology

## 2013-07-27 ENCOUNTER — Encounter (HOSPITAL_COMMUNITY)
Admission: EM | Disposition: A | Discharge status: Nursing Home (Includes ICF) | Payer: Self-pay | Source: Home / Self Care | Attending: Pulmonary Disease

## 2013-07-27 DIAGNOSIS — N289 Disorder of kidney and ureter, unspecified: Secondary | ICD-10-CM | POA: Diagnosis not present

## 2013-07-27 DIAGNOSIS — L089 Local infection of the skin and subcutaneous tissue, unspecified: Secondary | ICD-10-CM

## 2013-07-27 DIAGNOSIS — R6521 Severe sepsis with septic shock: Secondary | ICD-10-CM

## 2013-07-27 DIAGNOSIS — E119 Type 2 diabetes mellitus without complications: Secondary | ICD-10-CM | POA: Diagnosis not present

## 2013-07-27 DIAGNOSIS — A419 Sepsis, unspecified organism: Secondary | ICD-10-CM

## 2013-07-27 DIAGNOSIS — E756 Lipid storage disorder, unspecified: Secondary | ICD-10-CM

## 2013-07-27 DIAGNOSIS — N189 Chronic kidney disease, unspecified: Secondary | ICD-10-CM

## 2013-07-27 DIAGNOSIS — J9600 Acute respiratory failure, unspecified whether with hypoxia or hypercapnia: Secondary | ICD-10-CM | POA: Diagnosis not present

## 2013-07-27 DIAGNOSIS — L723 Sebaceous cyst: Secondary | ICD-10-CM

## 2013-07-27 DIAGNOSIS — L988 Other specified disorders of the skin and subcutaneous tissue: Secondary | ICD-10-CM

## 2013-07-27 DIAGNOSIS — I5032 Chronic diastolic (congestive) heart failure: Secondary | ICD-10-CM

## 2013-07-27 DIAGNOSIS — R6520 Severe sepsis without septic shock: Secondary | ICD-10-CM

## 2013-07-27 DIAGNOSIS — R799 Abnormal finding of blood chemistry, unspecified: Secondary | ICD-10-CM

## 2013-07-27 DIAGNOSIS — L02219 Cutaneous abscess of trunk, unspecified: Secondary | ICD-10-CM | POA: Diagnosis not present

## 2013-07-27 DIAGNOSIS — J449 Chronic obstructive pulmonary disease, unspecified: Secondary | ICD-10-CM | POA: Diagnosis not present

## 2013-07-27 DIAGNOSIS — R918 Other nonspecific abnormal finding of lung field: Secondary | ICD-10-CM | POA: Diagnosis not present

## 2013-07-27 DIAGNOSIS — Z4682 Encounter for fitting and adjustment of non-vascular catheter: Secondary | ICD-10-CM | POA: Diagnosis not present

## 2013-07-27 DIAGNOSIS — I214 Non-ST elevation (NSTEMI) myocardial infarction: Secondary | ICD-10-CM | POA: Diagnosis not present

## 2013-07-27 DIAGNOSIS — R141 Gas pain: Secondary | ICD-10-CM | POA: Diagnosis not present

## 2013-07-27 HISTORY — PX: IRRIGATION AND DEBRIDEMENT ABSCESS: SHX5252

## 2013-07-27 LAB — BLOOD GAS, ARTERIAL
Acid-base deficit: 5.8 mmol/L — ABNORMAL HIGH (ref 0.0–2.0)
Bicarbonate: 18.4 mEq/L — ABNORMAL LOW (ref 20.0–24.0)
Drawn by: 10006
O2 Saturation: 94.8 %
Patient temperature: 98.6
TCO2: 19.4 mmol/L (ref 0–100)
pCO2 arterial: 32.3 mmHg — ABNORMAL LOW (ref 35.0–45.0)
pH, Arterial: 7.375 (ref 7.350–7.450)
pO2, Arterial: 80 mmHg (ref 80.0–100.0)

## 2013-07-27 LAB — BASIC METABOLIC PANEL
BUN: 72 mg/dL — ABNORMAL HIGH (ref 6–23)
BUN: 63 mg/dL — ABNORMAL HIGH (ref 6–23)
BUN: 58 mg/dL — ABNORMAL HIGH (ref 6–23)
CO2: 17 mEq/L — ABNORMAL LOW (ref 19–32)
CO2: 20 mEq/L (ref 19–32)
CO2: 19 mEq/L (ref 19–32)
Calcium: 8.8 mg/dL (ref 8.4–10.5)
Calcium: 8.5 mg/dL (ref 8.4–10.5)
Calcium: 8.2 mg/dL — ABNORMAL LOW (ref 8.4–10.5)
Chloride: 107 mEq/L (ref 96–112)
Chloride: 113 mEq/L — ABNORMAL HIGH (ref 96–112)
Chloride: 114 mEq/L — ABNORMAL HIGH (ref 96–112)
Creatinine, Ser: 2.52 mg/dL — ABNORMAL HIGH (ref 0.50–1.35)
Creatinine, Ser: 2.23 mg/dL — ABNORMAL HIGH (ref 0.50–1.35)
Creatinine, Ser: 2.16 mg/dL — ABNORMAL HIGH (ref 0.50–1.35)
GFR calc Af Amer: 32 mL/min — ABNORMAL LOW (ref >90)
GFR calc Af Amer: 37 mL/min — ABNORMAL LOW (ref >90)
GFR calc Af Amer: 39 mL/min — ABNORMAL LOW (ref >90)
GFR calc non Af Amer: 28 mL/min — ABNORMAL LOW (ref >90)
GFR calc non Af Amer: 32 mL/min — ABNORMAL LOW (ref >90)
GFR calc non Af Amer: 33 mL/min — ABNORMAL LOW (ref >90)
Glucose, Bld: 439 mg/dL — ABNORMAL HIGH (ref 70–99)
Glucose, Bld: 214 mg/dL — ABNORMAL HIGH (ref 70–99)
Glucose, Bld: 193 mg/dL — ABNORMAL HIGH (ref 70–99)
Potassium: 3.4 mEq/L — ABNORMAL LOW (ref 3.7–5.3)
Potassium: 4.2 mEq/L (ref 3.7–5.3)
Potassium: 3.7 mEq/L (ref 3.7–5.3)
Sodium: 142 mEq/L (ref 137–147)
Sodium: 145 mEq/L (ref 137–147)
Sodium: 146 mEq/L (ref 137–147)

## 2013-07-27 LAB — CBC
HCT: 38.8 % — ABNORMAL LOW (ref 39.0–52.0)
Hemoglobin: 13.5 g/dL (ref 13.0–17.0)
MCH: 29.7 pg (ref 26.0–34.0)
MCHC: 34.8 g/dL (ref 30.0–36.0)
MCV: 85.3 fL (ref 78.0–100.0)
Platelets: 208 10*3/uL (ref 150–400)
RBC: 4.55 MIL/uL (ref 4.22–5.81)
RDW: 13.3 % (ref 11.5–15.5)
WBC: 24.9 10*3/uL — ABNORMAL HIGH (ref 4.0–10.5)

## 2013-07-27 LAB — GLUCOSE, CAPILLARY
Glucose-Capillary: 318 mg/dL — ABNORMAL HIGH (ref 70–99)
Glucose-Capillary: 355 mg/dL — ABNORMAL HIGH (ref 70–99)
Glucose-Capillary: 382 mg/dL — ABNORMAL HIGH (ref 70–99)
Glucose-Capillary: 409 mg/dL — ABNORMAL HIGH (ref 70–99)
Glucose-Capillary: 476 mg/dL — ABNORMAL HIGH (ref 70–99)
Glucose-Capillary: 555 mg/dL (ref 70–99)
Glucose-Capillary: >600 mg/dL (ref 70–99)
Glucose-Capillary: >600 mg/dL (ref 70–99)
Glucose-Capillary: >600 mg/dL (ref 70–99)
Glucose-Capillary: >600 mg/dL (ref 70–99)
Glucose-Capillary: >600 mg/dL (ref 70–99)
Glucose-Capillary: >600 mg/dL (ref 70–99)
Glucose-Capillary: >600 mg/dL (ref 70–99)
Glucose-Capillary: 152 mg/dL — ABNORMAL HIGH (ref 70–99)
Glucose-Capillary: 195 mg/dL — ABNORMAL HIGH (ref 70–99)
Glucose-Capillary: 271 mg/dL — ABNORMAL HIGH (ref 70–99)
Glucose-Capillary: 130 mg/dL — ABNORMAL HIGH (ref 70–99)
Glucose-Capillary: 161 mg/dL — ABNORMAL HIGH (ref 70–99)
Glucose-Capillary: 173 mg/dL — ABNORMAL HIGH (ref 70–99)

## 2013-07-27 LAB — POCT I-STAT 3, ART BLOOD GAS (G3+)
Acid-base deficit: 7 mmol/L — ABNORMAL HIGH (ref 0.0–2.0)
Bicarbonate: 21.2 mEq/L (ref 20.0–24.0)
O2 Saturation: 96 %
Patient temperature: 98.6
TCO2: 23 mmol/L (ref 0–100)
pCO2 arterial: 51.1 mmHg — ABNORMAL HIGH (ref 35.0–45.0)
pH, Arterial: 7.225 — ABNORMAL LOW (ref 7.350–7.450)
pO2, Arterial: 101 mmHg — ABNORMAL HIGH (ref 80.0–100.0)

## 2013-07-27 LAB — POCT I-STAT 7, (LYTES, BLD GAS, ICA,H+H)
Acid-base deficit: 5 mmol/L — ABNORMAL HIGH (ref 0.0–2.0)
Bicarbonate: 22.6 mEq/L (ref 20.0–24.0)
Calcium, Ion: 1.25 mmol/L — ABNORMAL HIGH (ref 1.12–1.23)
HCT: 40 % (ref 39.0–52.0)
Hemoglobin: 13.6 g/dL (ref 13.0–17.0)
O2 Saturation: 100 %
Patient temperature: 36.2
Potassium: 3.8 mEq/L (ref 3.7–5.3)
Sodium: 146 mEq/L (ref 137–147)
TCO2: 24 mmol/L (ref 0–100)
pCO2 arterial: 51.1 mmHg — ABNORMAL HIGH (ref 35.0–45.0)
pH, Arterial: 7.248 — ABNORMAL LOW (ref 7.350–7.450)
pO2, Arterial: 377 mmHg — ABNORMAL HIGH (ref 80.0–100.0)

## 2013-07-27 LAB — URINE CULTURE
Colony Count: NO GROWTH
Culture: NO GROWTH

## 2013-07-27 LAB — TROPONIN I: Troponin I: 12.33 ng/mL (ref <0.30)

## 2013-07-27 LAB — HEPARIN LEVEL (UNFRACTIONATED): Heparin Unfractionated: 0.17 IU/mL — ABNORMAL LOW (ref 0.30–0.70)

## 2013-07-27 LAB — PHOSPHORUS: Phosphorus: 2.3 mg/dL (ref 2.3–4.6)

## 2013-07-27 LAB — CORTISOL: Cortisol, Plasma: 53.8 ug/dL

## 2013-07-27 LAB — MAGNESIUM: Magnesium: 2.6 mg/dL — ABNORMAL HIGH (ref 1.5–2.5)

## 2013-07-27 SURGERY — IRRIGATION AND DEBRIDEMENT ABSCESS
Anesthesia: General | Site: Back

## 2013-07-27 MED ORDER — PROPOFOL 10 MG/ML IV BOLUS
INTRAVENOUS | Status: DC | PRN
  Administered 2013-07-27: 160 mg via INTRAVENOUS

## 2013-07-27 MED ORDER — SODIUM CHLORIDE 0.9 % IR SOLN
Status: DC | PRN
  Administered 2013-07-27: 1000 mL

## 2013-07-27 MED ORDER — ROCURONIUM BROMIDE 100 MG/10ML IV SOLN
INTRAVENOUS | Status: DC | PRN
  Administered 2013-07-27 (×3): 50 mg via INTRAVENOUS

## 2013-07-27 MED ORDER — MIDAZOLAM HCL 5 MG/5ML IJ SOLN
INTRAMUSCULAR | Status: DC | PRN
  Administered 2013-07-27: 2 mg via INTRAVENOUS

## 2013-07-27 MED ORDER — MIDAZOLAM HCL 2 MG/2ML IJ SOLN
INTRAMUSCULAR | Status: AC
  Filled 2013-07-27: qty 2

## 2013-07-27 MED ORDER — PHENYLEPHRINE HCL 10 MG/ML IJ SOLN
10.0000 mg | INTRAVENOUS | Status: DC | PRN
  Administered 2013-07-27: 10 ug/min via INTRAVENOUS

## 2013-07-27 MED ORDER — POTASSIUM CHLORIDE 10 MEQ/50ML IV SOLN
10.0000 meq | INTRAVENOUS | Status: AC
  Administered 2013-07-27 (×3): 10 meq via INTRAVENOUS
  Filled 2013-07-27: qty 50

## 2013-07-27 MED ORDER — HEPARIN SODIUM (PORCINE) 5000 UNIT/ML IJ SOLN
5000.0000 [IU] | Freq: Three times a day (TID) | INTRAMUSCULAR | Status: DC
  Administered 2013-07-27 – 2013-07-29 (×6): 5000 [IU] via SUBCUTANEOUS
  Filled 2013-07-27 (×10): qty 1

## 2013-07-27 MED ORDER — LIDOCAINE HCL (CARDIAC) 20 MG/ML IV SOLN
INTRAVENOUS | Status: AC
  Filled 2013-07-27: qty 5

## 2013-07-27 MED ORDER — PROPOFOL 10 MG/ML IV BOLUS
INTRAVENOUS | Status: AC
  Filled 2013-07-27: qty 20

## 2013-07-27 MED ORDER — SODIUM BICARBONATE 8.4 % IV SOLN
INTRAVENOUS | Status: DC | PRN
  Administered 2013-07-27: 50 meq via INTRAVENOUS

## 2013-07-27 MED ORDER — ROCURONIUM BROMIDE 50 MG/5ML IV SOLN
INTRAVENOUS | Status: AC
  Filled 2013-07-27: qty 2

## 2013-07-27 MED ORDER — 0.9 % SODIUM CHLORIDE (POUR BTL) OPTIME
TOPICAL | Status: DC | PRN
  Administered 2013-07-27 (×2): 1000 mL

## 2013-07-27 MED ORDER — DAKINS (1/2 STRENGTH) 0.25 % EX SOLN: Freq: Once | CUTANEOUS | Status: DC

## 2013-07-27 MED ORDER — SUCCINYLCHOLINE CHLORIDE 20 MG/ML IJ SOLN
INTRAMUSCULAR | Status: AC
  Filled 2013-07-27: qty 1

## 2013-07-27 MED ORDER — SODIUM CHLORIDE 0.9 % IV BOLUS (SEPSIS)
1000.0000 mL | INTRAVENOUS | Status: DC | PRN
  Administered 2013-07-27 (×2): 1000 mL via INTRAVENOUS

## 2013-07-27 MED ORDER — LIDOCAINE HCL (CARDIAC) 20 MG/ML IV SOLN
INTRAVENOUS | Status: DC | PRN
  Administered 2013-07-27: 50 mg via INTRAVENOUS

## 2013-07-27 MED ORDER — DEXMEDETOMIDINE HCL IN NACL 200 MCG/50ML IV SOLN
INTRAVENOUS | Status: DC | PRN
  Administered 2013-07-27: 0.4 ug/kg/h via INTRAVENOUS

## 2013-07-27 MED ORDER — BIOTENE DRY MOUTH MT LIQD
15.0000 mL | Freq: Two times a day (BID) | OROMUCOSAL | Status: DC
  Administered 2013-07-27 – 2013-07-29 (×5): 15 mL via OROMUCOSAL

## 2013-07-27 MED ORDER — NOREPINEPHRINE BITARTRATE 1 MG/ML IJ SOLN
2.0000 ug/min | INTRAVENOUS | Status: DC
  Filled 2013-07-27: qty 4

## 2013-07-27 MED ORDER — SODIUM CHLORIDE 0.9 % IV SOLN
25.0000 ug/h | INTRAVENOUS | Status: DC
  Administered 2013-07-27: 50 ug/h via INTRAVENOUS
  Administered 2013-07-28: 125 ug/h via INTRAVENOUS
  Administered 2013-07-29: 150 ug/h via INTRAVENOUS
  Filled 2013-07-27 (×3): qty 50

## 2013-07-27 MED ORDER — FENTANYL CITRATE 0.05 MG/ML IJ SOLN
100.0000 ug | Freq: Once | INTRAMUSCULAR | Status: AC
  Administered 2013-07-27: 100 ug via INTRAVENOUS

## 2013-07-27 MED ORDER — FENTANYL CITRATE 0.05 MG/ML IJ SOLN
INTRAMUSCULAR | Status: DC | PRN
Start: 2013-07-27 — End: 2013-07-27
  Administered 2013-07-27 (×3): 50 ug via INTRAVENOUS
  Administered 2013-07-27: 100 ug via INTRAVENOUS

## 2013-07-27 MED ORDER — CHLORHEXIDINE GLUCONATE 0.12 % MT SOLN
15.0000 mL | Freq: Two times a day (BID) | OROMUCOSAL | Status: DC
  Administered 2013-07-27 – 2013-07-30 (×7): 15 mL via OROMUCOSAL
  Filled 2013-07-27 (×6): qty 15

## 2013-07-27 MED ORDER — DAKINS (1/2 STRENGTH) 0.25 % EX SOLN
CUTANEOUS | Status: DC
  Filled 2013-07-27: qty 473

## 2013-07-27 MED ORDER — FENTANYL CITRATE 0.05 MG/ML IJ SOLN
INTRAMUSCULAR | Status: AC
  Filled 2013-07-27: qty 5

## 2013-07-27 MED ORDER — NOREPINEPHRINE BITARTRATE 1 MG/ML IJ SOLN
5.0000 ug/min | INTRAVENOUS | Status: DC
  Administered 2013-07-27: 25 ug/min via INTRAVENOUS
  Administered 2013-07-28: 20 ug/min via INTRAVENOUS
  Administered 2013-07-28: 25 ug/min via INTRAVENOUS
  Administered 2013-07-29: 3 ug/min via INTRAVENOUS
  Filled 2013-07-27 (×4): qty 16

## 2013-07-27 MED ORDER — PROPOFOL 10 MG/ML IV BOLUS
INTRAVENOUS | Status: AC
Start: 2013-07-27 — End: 2013-07-27
  Filled 2013-07-27: qty 20

## 2013-07-27 MED ORDER — POTASSIUM CHLORIDE 20 MEQ/15ML (10%) PO LIQD
40.0000 meq | Freq: Once | ORAL | Status: DC
  Filled 2013-07-27: qty 30

## 2013-07-27 MED ORDER — DEXTROSE 5 % IV SOLN
5.0000 ug/min | INTRAVENOUS | Status: DC
Start: 2013-07-27 — End: 2013-07-27
  Administered 2013-07-27: 16 ug/min via INTRAVENOUS
  Administered 2013-07-27: 5 ug/min via INTRAVENOUS
  Filled 2013-07-27 (×2): qty 4

## 2013-07-27 MED ORDER — LACTATED RINGERS IV SOLN
INTRAVENOUS | Status: DC | PRN
  Administered 2013-07-27 (×2): via INTRAVENOUS

## 2013-07-27 SURGICAL SUPPLY — 50 items
BLADE SURG 10 STRL SS (BLADE) ×4 IMPLANT
BLADE SURG 15 STRL LF DISP TIS (BLADE) ×1 IMPLANT
BLADE SURG 15 STRL SS (BLADE) ×1
BNDG GAUZE ELAST 4 BULKY (GAUZE/BANDAGES/DRESSINGS) ×10 IMPLANT
CHLORAPREP W/TINT 26ML (MISCELLANEOUS) ×2 IMPLANT
CONT SPEC 4OZ CLIKSEAL STRL BL (MISCELLANEOUS) ×2 IMPLANT
COVER SURGICAL LIGHT HANDLE (MISCELLANEOUS) ×2 IMPLANT
DECANTER SPIKE VIAL GLASS SM (MISCELLANEOUS) ×2 IMPLANT
DERMABOND ADVANCED (GAUZE/BANDAGES/DRESSINGS) ×1
DERMABOND ADVANCED .7 DNX12 (GAUZE/BANDAGES/DRESSINGS) ×1 IMPLANT
DRAPE ORTHO SPLIT 77X108 STRL (DRAPES) ×1
DRAPE PED LAPAROTOMY (DRAPES) ×2 IMPLANT
DRAPE SURG ORHT 6 SPLT 77X108 (DRAPES) ×1 IMPLANT
DRSG PAD ABDOMINAL 8X10 ST (GAUZE/BANDAGES/DRESSINGS) ×6 IMPLANT
ELECT CAUTERY BLADE 6.4 (BLADE) ×2 IMPLANT
ELECT REM PT RETURN 9FT ADLT (ELECTROSURGICAL) ×2
ELECTRODE REM PT RTRN 9FT ADLT (ELECTROSURGICAL) ×1 IMPLANT
GAUZE SPONGE 4X4 16PLY XRAY LF (GAUZE/BANDAGES/DRESSINGS) ×2 IMPLANT
GLOVE BIO SURGEON STRL SZ 6.5 (GLOVE) ×4 IMPLANT
GLOVE BIO SURGEON STRL SZ7.5 (GLOVE) ×2 IMPLANT
GLOVE BIOGEL M STRL SZ7.5 (GLOVE) ×2 IMPLANT
GLOVE BIOGEL PI IND STRL 7.0 (GLOVE) ×3 IMPLANT
GLOVE BIOGEL PI IND STRL 8 (GLOVE) ×1 IMPLANT
GLOVE BIOGEL PI INDICATOR 7.0 (GLOVE) ×3
GLOVE BIOGEL PI INDICATOR 8 (GLOVE) ×1
GLOVE EUDERMIC 6.5 POWDERFREE (GLOVE) ×2 IMPLANT
GLOVE SURG SS PI 6.5 STRL IVOR (GLOVE) ×2 IMPLANT
GLOVE SURG SS PI 7.0 STRL IVOR (GLOVE) ×2 IMPLANT
GOWN STRL NON-REIN LRG LVL3 (GOWN DISPOSABLE) ×2 IMPLANT
GOWN STRL REIN XL XLG (GOWN DISPOSABLE) ×2 IMPLANT
HANDPIECE INTERPULSE COAX TIP (DISPOSABLE) ×1
KIT BASIN OR (CUSTOM PROCEDURE TRAY) ×2 IMPLANT
KIT ROOM TURNOVER OR (KITS) ×2 IMPLANT
MARKER SKIN DUAL TIP RULER LAB (MISCELLANEOUS) ×2 IMPLANT
NEEDLE HYPO 25GX1X1/2 BEV (NEEDLE) ×2 IMPLANT
NS IRRIG 1000ML POUR BTL (IV SOLUTION) ×2 IMPLANT
PACK SURGICAL SETUP 50X90 (CUSTOM PROCEDURE TRAY) ×2 IMPLANT
PAD ABD 8X10 STRL (GAUZE/BANDAGES/DRESSINGS) ×2 IMPLANT
PAD ARMBOARD 7.5X6 YLW CONV (MISCELLANEOUS) ×2 IMPLANT
PENCIL BUTTON HOLSTER BLD 10FT (ELECTRODE) ×2 IMPLANT
SET HNDPC FAN SPRY TIP SCT (DISPOSABLE) ×1 IMPLANT
SPONGE LAP 18X18 X RAY DECT (DISPOSABLE) ×6 IMPLANT
SUT MNCRL AB 4-0 PS2 18 (SUTURE) ×2 IMPLANT
SUT VIC AB 3-0 SH 27 (SUTURE) ×2
SUT VIC AB 3-0 SH 27XBRD (SUTURE) ×2 IMPLANT
SUT VICRYL AB 2 0 TIES (SUTURE) ×2 IMPLANT
SYR CONTROL 10ML LL (SYRINGE) ×2 IMPLANT
TAPE CLOTH SURG 6X10 WHT LF (GAUZE/BANDAGES/DRESSINGS) ×2 IMPLANT
TOWEL OR 17X24 6PK STRL BLUE (TOWEL DISPOSABLE) ×2 IMPLANT
TOWEL OR 17X26 10 PK STRL BLUE (TOWEL DISPOSABLE) ×2 IMPLANT

## 2013-07-27 NOTE — Progress Notes (Signed)
[name], male   DOB: 1960/08/24, 53 y.o.   MRN: [id number]    Subjective: Pt awake, but still lethargic appearing.  No major complaints  Objective: Vital signs in last 24 hours: Temp:  [97.8 F (36.6 C)-98.5 F (36.9 C)] 98.5 F (36.9 C) (02/20 0425) Pulse Rate:  [28-116] 88 (02/20 0700) Resp:  [22-43] 35 (02/20 0700) BP: (81-117)/(48-83) 101/57 mmHg (02/20 0700) SpO2:  [93 %-100 %] 95 % (02/20 0700) Arterial Line BP: (88-121)/(31-67) 99/58 mmHg (02/20 0700) Weight:  [263 lb 3.7 oz (119.4 kg)] 263 lb 3.7 oz (119.4 kg) (02/20 0500)    Intake/Output from previous day: 02/19 0701 - 02/20 0700 In: 3028.4 [I.V.:2678.4; IV Piggyback:350] Out: 1610 [Urine:1610] Intake/Output this shift:    PE: Skin: patient laying on his back.  Did not relook at wound given OR today. Heart: paced, but regular  Lab Results:   Recent Labs  07/26/13 1603 07/27/13 0420  WBC 23.3* 24.9*  HGB 13.7 13.5  HCT 41.0 38.8*  PLT 228 208   BMET  Recent Labs  07/26/13 2013 07/27/13 0420  NA 137 142  K 4.2 3.4*  CL 100 107  CO2 15* 17*  GLUCOSE 912* 439*  BUN 77* 72*  CREATININE 2.49* 2.52*  CALCIUM 8.6 8.8   PT/INR  Recent Labs  07/26/13 1528  LABPROT 16.3*  INR 1.34   CMP     Component Value Date/Time   NA 142 07/27/2013 0420   K 3.4* 07/27/2013 0420   CL 107 07/27/2013 0420   CO2 17* 07/27/2013 0420   GLUCOSE 439* 07/27/2013 0420   BUN 72* 07/27/2013 0420   CREATININE 2.52* 07/27/2013 0420   CALCIUM 8.8 07/27/2013 0420   PROT 7.3 07/26/2013 1316   ALBUMIN 2.1* 07/26/2013 1316   AST 18 07/26/2013 1316   ALT 19 07/26/2013 1316   ALKPHOS 170* 07/26/2013 1316   BILITOT 0.3 07/26/2013 1316   GFRNONAA 28* 07/27/2013 0420   GFRAA 32* 07/27/2013 0420   Lipase  No results found for this basename: lipase       Studies/Results: Dg Chest Port 1 View  07/26/2013   CLINICAL DATA:  Check  central line placement  EXAM: PORTABLE CHEST - 1 VIEW  COMPARISON:  07/26/2013 1326  hrs  FINDINGS: Cardiac shadow is stable. A new right jugular central line is noted with the tip at the cavoatrial junction. The lungs are clear bilaterally. No pneumothorax is seen. Bilateral subcutaneous emphysema is noted. A pacing device is again seen. These changes are stable from the prior exam.  IMPRESSION: Status post central line placement without pneumothorax.  Generalized subcutaneous emphysema is again noted particularly on the left side.   Electronically Signed   By: Inez Catalina M.D.   On: 07/26/2013 16:08   Dg Chest Port 1 View  07/26/2013   CLINICAL DATA:  Fall, confusion and abscess of back.  EXAM: PORTABLE CHEST - 1 VIEW  COMPARISON:  DG CHEST 2 VIEW dated 06/12/2012; DG CHEST 2 VIEW dated 08/20/2011  FINDINGS: There is some subcutaneous air at the base of the left neck and extending along the lateral left chest wall. A definite pneumothorax is not identified by portable chest x-ray. Given clinical history, this may represent a necrotizing process with extensive subcutaneous air present. CT evaluation of the neck and chest with contrast would be helpful.  No displaced rib fractures are seen. The heart size and mediastinal contours are stable. There is stable appearance of the dual  chamber pacemaker.  IMPRESSION: Subcutaneous air in the left neck and left chest wall. A definite pneumothorax or pneumomediastinum is not visualized and this may represent a necrotizing infectious process given the history of abscess on the back. Recommend correlation with CT of the neck and chest with contrast.  Critical Value/emergent results were called by telephone at the time of interpretation on 07/26/2013 at 2:08 PM to [facility], PA-C, who verbally acknowledged these results.   Electronically Signed   By: Aletta Edouard M.D.   On: 07/26/2013 14:08    Anti-infectives: Anti-infectives   Start     Dose/Rate Route Frequency Ordered Stop   07/27/13 0000  vancomycin (VANCOCIN) IVPB 1000 mg/200 mL premix     1,000  mg 200 mL/hr over 60 Minutes Intravenous Every 12 hours 07/26/13 1329     07/26/13 2200  piperacillin-tazobactam (ZOSYN) IVPB 3.375 g     3.375 g 12.5 mL/hr over 240 Minutes Intravenous Every 8 hours 07/26/13 1329     07/26/13 1630  clindamycin (CLEOCIN) IVPB 600 mg     600 mg 100 mL/hr over 30 Minutes Intravenous 3 times per day 07/26/13 1556     07/26/13 1330  piperacillin-tazobactam (ZOSYN) IVPB 3.375 g     3.375 g 100 mL/hr over 30 Minutes Intravenous  Once 07/26/13 1316 07/26/13 1426   07/26/13 1330  vancomycin (VANCOCIN) IVPB 1000 mg/200 mL premix     1,000 mg 200 mL/hr over 60 Minutes Intravenous  Once 07/26/13 1316 07/26/13 1516   07/26/13 1330  vancomycin (VANCOCIN) 500 mg in sodium chloride 0.9 % 100 mL IVPB     500 mg 100 mL/hr over 60 Minutes Intravenous  Once 07/26/13 1329 07/26/13 1721       Assessment/Plan  1. Large back abscess 2. Elevated troponins, secondary to demand vs ischemic event, per cards 3. DKA Patient Active Problem List   Diagnosis Date Noted  . Necrotizing fasciitis 07/26/2013  . Septic shock 07/26/2013  . Metabolic acidosis 0000000  . Infected sebaceous cyst 07/16/2013  . Paroxysmal atrial fibrillation 03/27/2013    Class: Chronic  . Chronic diastolic HF (heart failure) 03/27/2013  . Peripheral edema 07/20/2012  . Obstructive sleep apnea, suspected 06/21/2012  . Pacemaker-St.Jude 02/23/2012  . Type 2 diabetes mellitus with hyperosmolar nonketotic hyperglycemia 08/20/2011  . Acute on chronic renal insufficiency 08/20/2011  . Troponin level elevated 08/20/2011  . Hypertension 08/09/2011  . COPD with asthma 08/09/2011  . Mobitz (type) II atrioventricular block 05/19/2011  . Fabry disease 05/19/2011   Plan: 1. Will d/w CCM to see if they feel patient is stable to proceed to the OR.  Obviously with his elevated troponins, he is at risk for further cardiac events due to general anesthesia; however, he will not over improve without surgical  intervention for this massive infection of his back. 2. CBGs are much better today.  Latest was 271.  3. Potassium being replaced already 4. Cont vanc and zosyn 5. Cont NPO for OR today.  LOS: 1 day    Jenson Beedle E 07/27/2013, 7:58 AM Pager: [phone]

## 2013-07-27 NOTE — Anesthesia Procedure Notes (Signed)
Procedure Name: Intubation Date/Time: 07/27/2013 11:47 AM Performed by: Maude Leriche D Pre-anesthesia Checklist: Patient identified, Emergency Drugs available, Suction available, Patient being monitored and Timeout performed Patient Re-evaluated:Patient Re-evaluated prior to inductionOxygen Delivery Method: Circle system utilized Preoxygenation: Pre-oxygenation with 100% oxygen Intubation Type: IV induction Ventilation: Mask ventilation without difficulty and Oral airway inserted - appropriate to patient size Laryngoscope Size: Miller and 2 Grade View: Grade I Tube type: Subglottic suction tube Tube size: 7.5 mm Number of attempts: 1 Airway Equipment and Method: Stylet Placement Confirmation: ETT inserted through vocal cords under direct vision,  positive ETCO2 and breath sounds checked- equal and bilateral Secured at: 23 cm Tube secured with: Tape Dental Injury: Teeth and Oropharynx as per pre-operative assessment

## 2013-07-27 NOTE — Progress Notes (Signed)
Pt seen and examined.  Now hypoTN and getting ready to start vasopressor Increasing troponin Pt has fabry disease which affects heart and kidneys - defer increasing troponins to card/ccm  Massive upper back infected sebaceous cyst- no crepitus. Large fluctuance/induration/some ischemic skin changes.   Feel this is source of sepsis Need to have source control ASAP Discussed risk/benefits/complications of surgery with pt (discussed with mother yesterday) Stop hep gtt for surgery Will stay intubated after surgery May need addl trips to OR for debridement To OR for incision and debridement of massive back abscess  Leighton Ruff. [place] Pulling, MD, FACS General, Bariatric, & Minimally Invasive Surgery [facility], [place]

## 2013-07-27 NOTE — Progress Notes (Signed)
Name: [name] MRN: [id number] DOB: 20-Jun-1960    ADMISSION DATE:  07/26/2013 CONSULTATION DATE:  07/26/2013  REFERRING MD :  Jeneen Rinks PRIMARY SERVICE: PCCM  CHIEF COMPLAINT:  Necrotizing fasciitis  BRIEF PATIENT DESCRIPTION: 53 y/o M admitted to [facility] with findings of necrotizing fasciitis of L neck.   SIGNIFICANT EVENTS / STUDIES:  2/19 - Admit from surgical center after a fall in parking lot, CXR c/w sub-q air concerning for nec fasc  LINES / TUBES: R IJ TLC 2/19>>>  CULTURES: BCx2 2/19>>>  UC 2/19>>>  Wound 2/19>>>  ANTIBIOTICS: Vanco 2/19>>>  Zosyn 2/19>>>  Clinda 2/19>>>  SUBJECTIVE: blood glucose improved, surgery wanting to take him to the OR as soon as possible.  VITAL SIGNS: Temp:  [97.8 F (36.6 C)-98.5 F (36.9 C)] 98.5 F (36.9 C) (02/20 0425) Pulse Rate:  [28-116] 89 (02/20 0600) Resp:  [22-43] 26 (02/20 0600) BP: (81-117)/(48-83) 99/48 mmHg (02/20 0600) SpO2:  [93 %-100 %] 94 % (02/20 0600) Arterial Line BP: (88-121)/(31-67) 89/60 mmHg (02/20 0600) Weight:  [263 lb 3.7 oz (119.4 kg)] 263 lb 3.7 oz (119.4 kg) (02/20 0500) HEMODYNAMICS: CVP:  [3 mmHg-6 mmHg] 5 mmHg VENTILATOR SETTINGS:   INTAKE / OUTPUT: Intake/Output     02/19 0701 - 02/20 0700 02/20 0701 - 02/21 0700   I.V. (mL/kg) 1428.4 (12)    IV Piggyback 350    Total Intake(mL/kg) 1778.4 (14.9)    Urine (mL/kg/hr) 1610    Total Output 1610     Net +168.4            PHYSICAL EXAMINATION: General: Morbidly obese male lying in bed Neuro: Lethargic but arouses HEENT: Mm dry, pink Cardiovascular: rrr, no m/r/g  Lungs: CTAB, no wheezes or crackles  Abdomen: Round/soft, bs +  Musculoskeletal: No acute deformities  Skin: Warm/dry, B LE edema, back with pink pad on it, surrounding fluctuance present   LABS:  CBC  Recent Labs Lab 07/26/13 1416 07/26/13 1603 07/27/13 0420  WBC  --  23.3* 24.9*  HGB 18.0* 13.7 13.5  HCT 53.0* 41.0 38.8*  PLT  --  228 208    Coag's  Recent Labs Lab 07/26/13 1528  APTT 25  INR 1.34   BMET  Recent Labs Lab 07/26/13 1528 07/26/13 2013 07/27/13 0420  NA 130* 137 142  K 5.3 4.2 3.4*  CL 92* 100 107  CO2 14* 15* 17*  BUN 78* 77* 72*  CREATININE 2.65* 2.49* 2.52*  GLUCOSE 1085* 912* 439*   Electrolytes  Recent Labs Lab 07/26/13 1528 07/26/13 2013 07/27/13 0420  CALCIUM 8.5 8.6 8.8  MG 2.5  --  2.6*  PHOS 6.7*  --  2.3   Sepsis Markers  Recent Labs Lab 07/26/13 1417 07/26/13 1529  LATICACIDVEN 7.25* 4.9*   ABG  Recent Labs Lab 07/26/13 1612 07/27/13 0505  PHART 7.276* 7.375  PCO2ART 30.3* 32.3*  PO2ART 84.0 80.0   Liver Enzymes  Recent Labs Lab 07/26/13 1316  AST 18  ALT 19  ALKPHOS 170*  BILITOT 0.3  ALBUMIN 2.1*   Cardiac Enzymes  Recent Labs Lab 07/26/13 2013 07/27/13 0130  TROPONINI 7.62* 12.33*   Glucose  Recent Labs Lab 07/27/13 0108 07/27/13 0215 07/27/13 0319 07/27/13 0413 07/27/13 0526 07/27/13 0630  GLUCAP 555* 476* 409* 382* 355* 318*    Imaging Dg Chest Port 1 View  07/26/2013   CLINICAL DATA:  Check  central line placement  EXAM: PORTABLE CHEST - 1 VIEW  COMPARISON:  07/26/2013 1326 hrs  FINDINGS: Cardiac shadow is stable. A new right jugular central line is noted with the tip at the cavoatrial junction. The lungs are clear bilaterally. No pneumothorax is seen. Bilateral subcutaneous emphysema is noted. A pacing device is again seen. These changes are stable from the prior exam.  IMPRESSION: Status post central line placement without pneumothorax.  Generalized subcutaneous emphysema is again noted particularly on the left side.   Electronically Signed   By: Inez Catalina M.D.   On: 07/26/2013 16:08   Dg Chest Port 1 View  07/26/2013   CLINICAL DATA:  Fall, confusion and abscess of back.  EXAM: PORTABLE CHEST - 1 VIEW  COMPARISON:  DG CHEST 2 VIEW dated 06/12/2012; DG CHEST 2 VIEW dated 08/20/2011  FINDINGS: There is some subcutaneous air at the  base of the left neck and extending along the lateral left chest wall. A definite pneumothorax is not identified by portable chest x-ray. Given clinical history, this may represent a necrotizing process with extensive subcutaneous air present. CT evaluation of the neck and chest with contrast would be helpful.  No displaced rib fractures are seen. The heart size and mediastinal contours are stable. There is stable appearance of the dual chamber pacemaker.  IMPRESSION: Subcutaneous air in the left neck and left chest wall. A definite pneumothorax or pneumomediastinum is not visualized and this may represent a necrotizing infectious process given the history of abscess on the back. Recommend correlation with CT of the neck and chest with contrast.  Critical Value/emergent results were called by telephone at the time of interpretation on 07/26/2013 at 2:08 PM to [facility] PA-C, who verbally acknowledged these results.   Electronically Signed   By: Aletta Edouard M.D.   On: 07/26/2013 14:08    ASSESSMENT / PLAN:  PULMONARY  A:  No acute issues P:  -supplemental O2 to keep sats >92%  -trend cxr for volume status -pulmonary hygiene -IS needed -keep intubated post op given ischemia risks  CARDIOVASCULAR  A:  Septic Shock [id number] Diastolic CHF / Hypertrophic Cardiomyopathy  HTN  Mobitz II  Hx Lymphedema LE's  Cortisol 53.8 Elevated lactic acid Elevated troponin - felt by cards to be demand ischemia, will have to revisit this P:  -monitor CVP -TTE today  -heparin infusion - will need to hold in setting of surgery -pressors if needed -appreciate cards consult - will recommuincate to ensure no further  -add levophed, to MAP goal, add EGDT -cvp to 10, caution cardiac status -no asa until ID done / surgery -repeat ecg -see ID  RENAL  A:  Lactic Acidosis improved Fabry's Disease  Chronic Renal Insufficiency - followed by Dr. Myrtie Hawk  Hypokalemia P:  -trend BMP q12h -lactic acid  trending down -replete K -renal US  GASTROINTESTINAL  A:  Morbid Obesity  P:  -NPO  -will need PPI post surgery while on vent   HEMATOLOGIC  A:  DVT Proph  P:  -SCD's  -heparin infusion, will need to hold if going to surgery today -add sub q hep   INFECTIOUS  A:  Necrotizing Fasciitis  LU Posterior Abscess  P:  -CCS consult for surgical debridement  -abx as above  -follow cultures  -clinda for toxin inhibition -needs OR ASAP, benefit of OR now noted, in face of trop elevation, would go straigh to OR and no furtehr risk stratification, will d/w cards  ENDOCRINE  A:  DM  Gout  P:  -IV insulin gtt with hyperglycemia protocol  -  monitor cbgs  NEUROLOGIC  A:  Pain  P:  -PRN fentanyl -would keep vented post op  Tommi Rumps, MD [place] PGY-2  TODAY'S SUMMARY: needs OR today, continue hyperglycemia protocol, continue antibiotics  I have personally obtained a history, examined the patient, evaluated laboratory and imaging results, formulated the assessment and plan and placed orders. CRITICAL CARE: The patient is critically ill with multiple organ systems failure and requires high complexity decision making for assessment and support, frequent evaluation and titration of therapies, application of advanced monitoring technologies and extensive interpretation of multiple databases. Critical Care Time devoted to patient care services described in this note is 35 minutes.   Lavon Paganini. Titus Mould, MD, [place] Pgr: [facility]  Pulmonary and [place] Pager: [phone]  07/27/2013, 7:07 AM

## 2013-07-27 NOTE — Anesthesia Preprocedure Evaluation (Signed)
Anesthesia Evaluation  Patient identified by MRN, date of birth, ID band Patient awake    Reviewed: Allergy & Precautions, H&P , NPO status , Patient's Chart, lab work & pertinent test results  Airway Mallampati: II  Neck ROM: full    Dental   Pulmonary shortness of breath, asthma , sleep apnea , COPD         Cardiovascular hypertension, +CHF + dysrhythmias Atrial Fibrillation + pacemaker  H/o Mobitz type II heart block   Neuro/Psych    GI/Hepatic   Endo/Other  diabetes, Type 2Morbid obesity  Renal/GU Renal InsufficiencyRenal disease     Musculoskeletal   Abdominal   Peds  Hematology   Anesthesia Other Findings   Reproductive/Obstetrics                           Anesthesia Physical Anesthesia Plan  ASA: IV  Anesthesia Plan: General   Post-op Pain Management:    Induction: Intravenous  Airway Management Planned: Oral ETT  Additional Equipment: Arterial line and CVP  Intra-op Plan:   Post-operative Plan: Post-operative intubation/ventilation  Informed Consent: I have reviewed the patients History and Physical, chart, labs and discussed the procedure including the risks, benefits and alternatives for the proposed anesthesia with the patient or authorized representative who has indicated his/her understanding and acceptance.     Plan Discussed with: CRNA, Anesthesiologist and Surgeon  Anesthesia Plan Comments:         Anesthesia Quick Evaluation

## 2013-07-27 NOTE — Anesthesia Postprocedure Evaluation (Signed)
  Anesthesia Post-op Note  [name]  Procedure(s) Performed: Procedure(s): IRRIGATION AND DEBRIDEMENT OF SKIN, SOFT TISSUE AND MUSCLES OF UPPER BACK (11X19X4cm) WITH 10 BLADE AND PULSATILE LAVAGE  (N/A)  Patient Location: ICU  Anesthesia Type:General  Level of Consciousness: sedated and unresponsive  Airway and Oxygen Therapy: Patient remains intubated per anesthesia plan and Patient placed on Ventilator (see vital sign flow sheet for setting)  Post-op Pain: none  Post-op Assessment: Post-op Vital signs reviewed, Patient's Cardiovascular Status Stable and Respiratory Function Stable  Post-op Vital Signs: Reviewed and stable  Complications: No apparent anesthesia complications

## 2013-07-27 NOTE — Progress Notes (Signed)
INITIAL NUTRITION ASSESSMENT  DOCUMENTATION CODES Per approved criteria  -Morbid Obesity   INTERVENTION:  If unable to extubate patient within the next 24 hours, recommend initiation of TF: utilize 5M PEPuP Protocol and initiate TF via OGT with Vital High Protein at 25 ml/h and Prostat 30 ml TID on day 1; on day 2, increase to goal rate of 60 ml/h (1440 ml per day) to provide 1740 kcals (25 kcals/kg ideal weight), 171 gm protein, 1204 ml free water daily.  NUTRITION DIAGNOSIS: Inadequate oral intake related to inability to eat as evidenced by NPO status.   Goal: Enteral nutrition to provide 60-70% of estimated calorie needs (22-25 kcals/kg ideal body weight) and 100% of estimated protein needs, based on ASPEN guidelines for permissive underfeeding in critically ill obese individuals.  Monitor:  TF initiation/tolerance/adequacy, weight trend, labs, vent status.  Reason for Assessment: VDRF  53 y.o. male  Admitting Dx: Necrotizing fasciitis  ASSESSMENT: Patient is a 53 y/o M admitted to [facility] on 2/19 with findings of necrotizing fasciitis of L neck S/P fall in parking lot.  Nutrition focused physical exam completed.  No muscle or subcutaneous fat depletion noticed.  Patient is currently intubated on ventilator support.  MV: 8.1 L/min Temp (24hrs), Avg:98.2 F (36.8 C), Min:97.6 F (36.4 C), Max:98.5 F (36.9 C)   Height: Ht Readings from Last 1 Encounters:  07/27/13 5\' 8"  (1.727 m)    Weight: Wt Readings from Last 1 Encounters:  07/27/13 263 lb 3.7 oz (119.4 kg)    Ideal Body Weight: 70 kg  % Ideal Body Weight: 171 %  Wt Readings from Last 10 Encounters:  07/27/13 263 lb 3.7 oz (119.4 kg)  07/27/13 263 lb 3.7 oz (119.4 kg)  07/20/13 265 lb 5 oz (120.345 kg)  07/16/13 270 lb 6.4 oz (122.653 kg)  07/06/13 273 lb 6 oz (124.002 kg)  06/22/13 274 lb 6 oz (124.456 kg)  06/08/13 272 lb 2 oz (123.435 kg)  05/25/13 272 lb (123.378 kg)  05/21/13 271 lb 1.9 oz (122.979  kg)  05/11/13 269 lb (122.018 kg)    Usual Body Weight: 272 lb  % Usual Body Weight: 97%  BMI:  Body mass index is 40.03 kg/(m^2). class 3, extreme/morbid obesity  Estimated Nutritional Needs: Kcal: 2150 Protein: 175 gm Fluid: 2.2 L  Skin: necrotizing fasciitis of left neck  Diet Order: NPO  EDUCATION NEEDS: -Education not appropriate at this time   Intake/Output Summary (Last 24 hours) at 07/27/13 1435 Last data filed at 07/27/13 1255  Gross per 24 hour  Intake 4842.95 ml  Output   2435 ml  Net 2407.95 ml    Last BM: 2/19   Labs:   Recent Labs Lab 07/26/13 1528 07/26/13 2013 07/27/13 0420  NA 130* 137 142  K 5.3 4.2 3.4*  CL 92* 100 107  CO2 14* 15* 17*  BUN 78* 77* 72*  CREATININE 2.65* 2.49* 2.52*  CALCIUM 8.5 8.6 8.8  MG 2.5  --  2.6*  PHOS 6.7*  --  2.3  GLUCOSE 1085* 912* 439*    CBG (last 3)   Recent Labs  07/27/13 0945 07/27/13 1046 07/27/13 1418  GLUCAP 152* 130* 161*    Scheduled Meds: . antiseptic oral rinse  15 mL Mouth Rinse q12n4p  . aspirin  324 mg Oral NOW   Or  . aspirin  300 mg Rectal NOW  . chlorhexidine  15 mL Mouth Rinse BID  . clindamycin (CLEOCIN) IV  600 mg Intravenous 3  times per day  . heparin subcutaneous  5,000 Units Subcutaneous 3 times per day  . pantoprazole (PROTONIX) IV  40 mg Intravenous QHS  . piperacillin-tazobactam (ZOSYN)  IV  3.375 g Intravenous Q8H  . sodium chloride  1,000 mL Intravenous Once  . vancomycin  1,000 mg Intravenous Q12H    Continuous Infusions: . sodium chloride 1,000 mL (07/27/13 0729)  . insulin (NOVOLIN-R) infusion 5.1 Units/hr (07/27/13 1433)  . norepinephrine (LEVOPHED) Adult infusion 16 mcg/min (07/27/13 1433)    Past Medical History  Diagnosis Date  . Second degree Mobitz II AV block     with syncope, s/p PPM  . Hypertension   . Bifascicular block   . Diabetes mellitus   . Pacemaker     02/12/11  . Lymphedema of lower extremity LEFT  >  RIGHT    USES PUMP AT HOME--  DOES NOT WEAR HOSE  . Short of breath on exertion   . History of cellulitis of skin with lymphangitis LEFT LEG  . Diastolic heart failure secondary to hypertrophic cardiomyopathy CARDIOLOGIST-- DR Daneen Schick  . Fabry's disease RENAL AND CARDIAC INVOVLEMENT    FOLLOWED DR [place]  . Chronic renal insufficiency   . Gout, arthritis 11/2011    bil feet. right worse  . Seasonal asthma NO INHALERS    Past Surgical History  Procedure Laterality Date  . Pacemaker insertion  02/12/11    SJM implanted by Dr Rayann Heman for Mobitz II second degree AV block and syncope  . Cardiac catheterization  03-12-2011  DR Daneen Schick    HYPERTROPHIC CARDIOMYOPATHY WITH LV CAVITY  APPEARANCE CONSISTANT WITH SIGNIFICANT APICAL HYPERTROPHY/ NORMAL LVSF / EF 55%/ EVIDENCE OF DIASTOLIC DYSFUNCTION WITH EDP OF 23-56mmHg AFTER A-WAVE/ NORMAL CORONARY ARTERIES  . Umbilical hernia repair  03/22/2012    Procedure: HERNIA REPAIR UMBILICAL ADULT;  Surgeon: Leighton Ruff, MD;  Location: [facility] ORS;  Service: General;  Laterality: N/A;  Umbilical Hernia Repair   . Hernia repair  0000000    Umbilical Hernia Repair    Molli Barrows, RD, LDN, [place] Pager [phone] After Hours Pager [phone]

## 2013-07-27 NOTE — Progress Notes (Signed)
Spoke with the cardiologist on call, Dr Tamala Julian, who is the patients outpatient cardiologist. States patient with fabry disease leading to large heart muscle that in combination with hypotension, is likely leading to the patients elevation in troponin. He stated he had cath'd the patient in the past and the patient did not have any coronary disease at that time. I advised that the patient would be going to the OR this morning and that we were going to stop the heparin. He advised that there was not anything significant that they would contribute past what has already been done at this time. He stated Dr Lidia Collum would be the physician to round on the patient this morning.   Tommi Rumps, MD [place] PGY-2

## 2013-07-27 NOTE — Progress Notes (Signed)
eLink Physician-Brief Progress Note Patient Name: [name] DOB: Aug 15, 1960 MRN: [id number]  Date of Service  07/27/2013   HPI/Events of Note   Agitation post operatively  eICU Interventions  D/c precedex (plan to return to OR tomorrow, no role for this med if no plan to extubate) Start fentanyl gtt   Intervention Category Minor Interventions: Agitation / anxiety - evaluation and management  Dayla Gasca 07/27/2013, 3:14 PM

## 2013-07-27 NOTE — Progress Notes (Signed)
[facility] Electrolyte Replacement Protocol  Patient Name: DONIELLE RADZIEWICZ DOB: [birth year] MRN: [id number]  Date of Service  07/27/2013   HPI/Events of Note    Recent Labs Lab 07/26/13 1316 07/26/13 1416 07/26/13 1528 07/26/13 2013 07/27/13 0420  NA 127* 129* 130* 137 142  K 5.8* 5.5* 5.3 4.2 3.4*  CL 84* 97 92* 100 107  CO2 12*  --  14* 15* 17*  GLUCOSE 1162* >700* 1085* 912* 439*  BUN 81* 74* 78* 77* 72*  CREATININE 2.69* 2.60* 2.65* 2.49* 2.52*  CALCIUM 9.9  --  8.5 8.6 8.8  MG  --   --  2.5  --  2.6*  PHOS  --   --  6.7*  --  2.3    The CrCl is unknown because both a height and weight (above a minimum accepted value) are required for this calculation.  Intake/Output     02/19 0701 - 02/20 0700   I.V. (mL/kg) 1428.4 (12)   IV Piggyback 350   Total Intake(mL/kg) 1778.4 (14.9)   Urine (mL/kg/hr) 1610   Total Output 1610   Net +168.4        - I/O DETAILED x24h    Total I/O In: 626.7 [I.V.:276.7; IV Piggyback:350] Out: 1610 [Urine:1610] - I/O THIS SHIFT    ASSESSMENT   eICURN Interventions   Electrolyte protocol criteria not met. Value not replaced due to renal status per protocol     ASSESSMENT: [place], Jayquon Theiler Nicole 07/27/2013, 6:33 AM

## 2013-07-27 NOTE — Progress Notes (Signed)
       Patient Name: [name] Date of Encounter: 07/27/2013    SUBJECTIVE: Intubated and sedated. Clinical data reviewed in detail.  Presented in shock with elevated troponin and hyperglycemia with presumed sepsis. Had coronary angio in October 2012 with normal coronary arteries documented. Does have LVH/Hypertrophic CM possibly related to Fabry's Disease. Has h/o CKD stage 3 also Fabry's related.  Has now undergone surgery to debride necrotizing fasciitis.  Has returned to unit with high dose Levophed .  TELEMETRY:  AV sequential pacing. Has h/o 3rd degree AV block treated with pacer.  Filed Vitals:   07/27/13 1400 07/27/13 1401 07/27/13 1500 07/27/13 1607  BP:  99/50    Pulse: 62 65 64   Temp:    98 F (36.7 C)  TempSrc:    Oral  Resp: 13 16 14    Height:  5\' 8"  (1.727 m)    Weight:      SpO2: 100% 100% 100%     Intake/Output Summary (Last 24 hours) at 07/27/13 1620 Last data filed at 07/27/13 1600  Gross per 24 hour  Intake 3842.95 ml  Output   2660 ml  Net 1182.95 ml    LABS: Basic Metabolic Panel:  Recent Labs  07/26/13 1528 07/26/13 2013 07/27/13 0420  NA 130* 137 142  K 5.3 4.2 3.4*  CL 92* 100 107  CO2 14* 15* 17*  GLUCOSE 1085* 912* 439*  BUN 78* 77* 72*  CREATININE 2.65* 2.49* 2.52*  CALCIUM 8.5 8.6 8.8  MG 2.5  --  2.6*  PHOS 6.7*  --  2.3   CBC:  Recent Labs  07/26/13 1603 07/27/13 0420  WBC 23.3* 24.9*  NEUTROABS 20.5*  --   HGB 13.7 13.5  HCT 41.0 38.8*  MCV 90.9 85.3  PLT 228 208   Cardiac Enzymes:  Recent Labs  07/26/13 2013 07/27/13 0130  TROPONINI 7.62* 12.33*   BNP (last 3 results)  Recent Labs  03/13/13 1245  PROBNP 721.0*    Radiology/Studies:  07/26/13 CXR IMPRESSION:  Subcutaneous air in the left neck and left chest wall. A definite  pneumothorax or pneumomediastinum is not visualized and this may  represent a necrotizing infectious process given the history of  abscess on the back. Recommend  correlation with CT of the neck and  chest with contrast.  Physical Exam: Blood pressure 99/50, pulse 64, temperature 98 F (36.7 C), temperature source Oral, resp. rate 14, height 5\' 8"  (1.727 m), weight 263 lb 3.7 oz (119.4 kg), SpO2 100.00%. Weight change:    No rub or murmur Clear lungs  ASSESSMENT:  1. NSTEMI type II, presumed related to shock in setting of hypertrophic CM. 2. Necrotizing fasciitis 3. CKD and anticipate acute superimposed injury 4. Continued profound hypotension  Plan:  1. Continue aggressive support 2. Needs echo done at some point this WE to assess LV and valves  Signed, Sinclair Grooms 07/27/2013, 4:20 PM

## 2013-07-27 NOTE — Progress Notes (Signed)
ANTICOAGULATION CONSULT NOTE - Follow Up Consult  Pharmacy Consult for Heparin  Indication: chest pain/ACS  Allergies  Allergen Reactions  . Shellfish Allergy Hives and Swelling    Patient Measurements: Weight: 263 lb 3.7 oz (119.4 kg) Heparin Dosing Weight: ~97 kg  Vital Signs: Temp: 98.5 F (36.9 C) (02/20 0425) Temp src: Oral (02/20 0425) BP: 91/48 mmHg (02/20 0000) Pulse Rate: 107 (02/20 0000)  Labs:  Recent Labs  07/26/13 1416 07/26/13 1528 07/26/13 1603 07/26/13 2013 07/27/13 0130 07/27/13 0420 07/27/13 0500  HGB 18.0*  --  13.7  --   --  13.5  --   HCT 53.0*  --  41.0  --   --  38.8*  --   PLT  --   --  228  --   --  208  --   APTT  --  25  --   --   --   --   --   LABPROT  --  16.3*  --   --   --   --   --   INR  --  1.34  --   --   --   --   --   HEPARINUNFRC  --   --   --   --   --   --  0.17*  CREATININE 2.60* 2.65*  --  2.49*  --  2.52*  --   TROPONINI  --   --   --  7.62* 12.33*  --   --     Medications:  Heparin 1200 units/hr  Assessment: 53 y/o M on heparin for elevated troponin, first HL is 0.17, other labs as above.   Goal of Therapy:  Heparin level 0.3-0.7 units/ml Monitor platelets by anticoagulation protocol: Yes   Plan:  -Increase heparin drip 1500 units/hr -1300 HL -Daily CBC/HL -Monitor for bleeding -F/U cardiology plans--they don't think it's a coronary event per consult note, likely demand ischemia  [place], Rozzelle 07/27/2013,5:44 AM

## 2013-07-27 NOTE — Op Note (Signed)
07/26/2013 - 07/27/2013  5:15 PM  PATIENT:  [name]  53 y.o. male  PRE-OPERATIVE DIAGNOSIS:  Necrotizing soft tissue infection of upper back  POST-OPERATIVE DIAGNOSIS:  Necrotizing soft tissue infection of upper back   PROCEDURE:  Procedure(s): IRRIGATION AND DEBRIDEMENT OF SKIN, SOFT TISSUE AND MUSCLES OF UPPER BACK (11X19X4cm) WITH SCALPEL AND PULSATILE LAVAGE   SURGEON:  Surgeon(s): Gayland Curry, MD  ASSISTANTS: none   ANESTHESIA:   general  DRAINS: none   LOCAL MEDICATIONS USED:  NONE  SPECIMEN:  Source of Specimen:  upper back skin and soft tissue and muscle  DISPOSITION OF SPECIMEN:  pathology and microbiolgy  COUNTS:  YES  INDICATION FOR PROCEDURE: This 53 year old African American male was scheduled to have incision and drainage of a back epidermoid inclusion cyst yesterday at the outpatient surgery Center. The patient was late and when he arrived he collapsed and was transported emergently to the emergency room. He was found to be in septic shock, hyperglycemic crisis with a blood sugar greater than 1200 and elevated troponins. His upper back epidermoid inclusion cyst had significantly changed over the weekend. There is extensive cellulitis as well as some skin necrosis. Critical care medicine felt that he needed to be resuscitated and his blood sugar Improved prior to going to the operating room. This morning he was felt to be in the best shape possible to go to the operating room despite an elevated troponin. He was taken to the operating room after informed consent was obtained. I had discussed the risk of the procedure with the patient's mother which included but not limited to bleeding, infection, injury to surrounding structures, need for additional procedures, death, scarring, large wound.  PROCEDURE: The patient was taken to operating room one at [facility] placed supine on the operating room table on top of a beanbag. General endotracheal  anesthesia was established. A new A-line was placed. He was then placed in the right lateral position with the appropriate padding. He had a very large upper back partially necrotic abscess. The area of cellulitis measured 23 cm wide by 11 cm vertically. A surgical timeout was performed. He is on broad-spectrum IV antibiotics. I first incised the central portion of the necrotic skin. There is a large rush of air. A large amount of seropurulent liquefied fat and muscle drain from the wound. I then sharply debrided the necrotic skin with scalpel. This revealed ongoing subcutaneous necrotizing infection. The necrotizing infection actually involved skin, subcutaneous fat, fascia and muscle. We kept debriding until we got to viable tissue. The trapezius muscle was exposed and some parts were sharply excised. We pulsatile lavaged the wound with 3 L of saline. Hemostasis was achieved with electrocautery. The total wound that was debrided was approximately 11 cm vertically by 19 cm wide by 4 cm deep. 3 Curlex gauze which were moistened were placed into the wound covered by dry gauze. The patient was left intubated and transported back to the ICU in critical condition. All needle instrument and sponge counts were correct x2. The patient will be needed brought back to the OR tomorrow for an exam under anesthesia and possible ongoing debridement. The areas to pay attention to will be the lateral aspects of his back  Picture orientation: Left side of the picture is toward the patient's feet; right side of the picture is toward the patient's head, top of the picture is patient's right side, bottom of the picture is toward the patient's left side  PLAN OF CARE: RETURN TO ICU  PATIENT DISPOSITION:  ICU - intubated and critically ill.   Delay start of Pharmacological VTE agent (>24hrs) due to surgical blood loss or risk of bleeding:  yes  Leighton Ruff. Redmond Pulling, MD, FACS General, Bariatric, & Minimally Invasive  Surgery [facility], [place]

## 2013-07-27 NOTE — Transfer of Care (Signed)
Immediate Anesthesia Transfer of Care Note  [name]  Procedure(s) Performed: Procedure(s): IRRIGATION AND DEBRIDEMENT OF SKIN, SOFT TISSUE AND MUSCLES OF UPPER BACK (11X19X4cm) WITH 10 BLADE AND PULSATILE LAVAGE  (N/A)  Patient Location: SICU  Anesthesia Type:General  Level of Consciousness: sedated  Airway & Oxygen Therapy: Patient remains intubated per anesthesia plan and Patient placed on Ventilator (see vital sign flow sheet for setting)  Post-op Assessment: Report given to PACU RN and Post -op Vital signs reviewed and stable  Post vital signs: Reviewed and stable  Complications: No apparent anesthesia complications

## 2013-07-27 NOTE — Progress Notes (Signed)
eLink Physician-Brief Progress Note Patient Name: [name] DOB: 22-Aug-1960 MRN: [id number]  Date of Service  07/27/2013   HPI/Events of Note   Returned from OR vented  eICU Interventions  cxr abg Vent orders placed   Intervention Category Major Interventions: Respiratory failure - evaluation and management  MCQUAID, DOUGLAS 07/27/2013, 8:54 PM

## 2013-07-28 ENCOUNTER — Inpatient Hospital Stay (HOSPITAL_COMMUNITY): Payer: Medicare Other

## 2013-07-28 DIAGNOSIS — J449 Chronic obstructive pulmonary disease, unspecified: Secondary | ICD-10-CM | POA: Diagnosis not present

## 2013-07-28 DIAGNOSIS — Z4682 Encounter for fitting and adjustment of non-vascular catheter: Secondary | ICD-10-CM | POA: Diagnosis not present

## 2013-07-28 DIAGNOSIS — I10 Essential (primary) hypertension: Secondary | ICD-10-CM | POA: Diagnosis not present

## 2013-07-28 DIAGNOSIS — L723 Sebaceous cyst: Secondary | ICD-10-CM | POA: Diagnosis not present

## 2013-07-28 DIAGNOSIS — N289 Disorder of kidney and ureter, unspecified: Secondary | ICD-10-CM | POA: Diagnosis not present

## 2013-07-28 DIAGNOSIS — N2889 Other specified disorders of kidney and ureter: Secondary | ICD-10-CM | POA: Diagnosis not present

## 2013-07-28 DIAGNOSIS — R918 Other nonspecific abnormal finding of lung field: Secondary | ICD-10-CM | POA: Diagnosis not present

## 2013-07-28 DIAGNOSIS — I5032 Chronic diastolic (congestive) heart failure: Secondary | ICD-10-CM | POA: Diagnosis not present

## 2013-07-28 LAB — GLUCOSE, CAPILLARY
Glucose-Capillary: 126 mg/dL — ABNORMAL HIGH (ref 70–99)
Glucose-Capillary: 140 mg/dL — ABNORMAL HIGH (ref 70–99)
Glucose-Capillary: 141 mg/dL — ABNORMAL HIGH (ref 70–99)
Glucose-Capillary: 145 mg/dL — ABNORMAL HIGH (ref 70–99)
Glucose-Capillary: 158 mg/dL — ABNORMAL HIGH (ref 70–99)
Glucose-Capillary: 174 mg/dL — ABNORMAL HIGH (ref 70–99)
Glucose-Capillary: 175 mg/dL — ABNORMAL HIGH (ref 70–99)
Glucose-Capillary: 189 mg/dL — ABNORMAL HIGH (ref 70–99)
Glucose-Capillary: 201 mg/dL — ABNORMAL HIGH (ref 70–99)
Glucose-Capillary: 164 mg/dL — ABNORMAL HIGH (ref 70–99)
Glucose-Capillary: 206 mg/dL — ABNORMAL HIGH (ref 70–99)
Glucose-Capillary: 214 mg/dL — ABNORMAL HIGH (ref 70–99)
Glucose-Capillary: 275 mg/dL — ABNORMAL HIGH (ref 70–99)
Glucose-Capillary: 254 mg/dL — ABNORMAL HIGH (ref 70–99)
Glucose-Capillary: 297 mg/dL — ABNORMAL HIGH (ref 70–99)
Glucose-Capillary: 262 mg/dL — ABNORMAL HIGH (ref 70–99)

## 2013-07-28 LAB — POCT I-STAT 3, ART BLOOD GAS (G3+)
Acid-base deficit: 6 mmol/L — ABNORMAL HIGH (ref 0.0–2.0)
Acid-base deficit: 8 mmol/L — ABNORMAL HIGH (ref 0.0–2.0)
Acid-base deficit: 4 mmol/L — ABNORMAL HIGH (ref 0.0–2.0)
Bicarbonate: 20.6 mEq/L (ref 20.0–24.0)
Bicarbonate: 17.8 mEq/L — ABNORMAL LOW (ref 20.0–24.0)
Bicarbonate: 19.3 mEq/L — ABNORMAL LOW (ref 20.0–24.0)
O2 Saturation: 97 %
O2 Saturation: 98 %
O2 Saturation: 99 %
Patient temperature: 98.6
Patient temperature: 98.9
Patient temperature: 98.4
TCO2: 22 mmol/L (ref 0–100)
TCO2: 19 mmol/L (ref 0–100)
TCO2: 20 mmol/L (ref 0–100)
pCO2 arterial: 45.3 mmHg — ABNORMAL HIGH (ref 35.0–45.0)
pCO2 arterial: 38 mmHg (ref 35.0–45.0)
pCO2 arterial: 29.4 mmHg — ABNORMAL LOW (ref 35.0–45.0)
pH, Arterial: 7.265 — ABNORMAL LOW (ref 7.350–7.450)
pH, Arterial: 7.278 — ABNORMAL LOW (ref 7.350–7.450)
pH, Arterial: 7.424 (ref 7.350–7.450)
pO2, Arterial: 110 mmHg — ABNORMAL HIGH (ref 80.0–100.0)
pO2, Arterial: 118 mmHg — ABNORMAL HIGH (ref 80.0–100.0)
pO2, Arterial: 128 mmHg — ABNORMAL HIGH (ref 80.0–100.0)

## 2013-07-28 LAB — MAGNESIUM: Magnesium: 2.1 mg/dL (ref 1.5–2.5)

## 2013-07-28 LAB — BASIC METABOLIC PANEL
BUN: 55 mg/dL — ABNORMAL HIGH (ref 6–23)
BUN: 52 mg/dL — ABNORMAL HIGH (ref 6–23)
CO2: 19 mEq/L (ref 19–32)
CO2: 21 mEq/L (ref 19–32)
Calcium: 8.2 mg/dL — ABNORMAL LOW (ref 8.4–10.5)
Calcium: 8.1 mg/dL — ABNORMAL LOW (ref 8.4–10.5)
Chloride: 112 mEq/L (ref 96–112)
Chloride: 112 mEq/L (ref 96–112)
Creatinine, Ser: 2.26 mg/dL — ABNORMAL HIGH (ref 0.50–1.35)
Creatinine, Ser: 2.11 mg/dL — ABNORMAL HIGH (ref 0.50–1.35)
GFR calc Af Amer: 37 mL/min — ABNORMAL LOW (ref >90)
GFR calc Af Amer: 40 mL/min — ABNORMAL LOW (ref >90)
GFR calc non Af Amer: 32 mL/min — ABNORMAL LOW (ref >90)
GFR calc non Af Amer: 34 mL/min — ABNORMAL LOW (ref >90)
Glucose, Bld: 330 mg/dL — ABNORMAL HIGH (ref 70–99)
Glucose, Bld: 306 mg/dL — ABNORMAL HIGH (ref 70–99)
Potassium: 4.6 mEq/L (ref 3.7–5.3)
Potassium: 4.1 mEq/L (ref 3.7–5.3)
Sodium: 147 mEq/L (ref 137–147)
Sodium: 145 mEq/L (ref 137–147)

## 2013-07-28 LAB — CBC
HCT: 38 % — ABNORMAL LOW (ref 39.0–52.0)
Hemoglobin: 12.9 g/dL — ABNORMAL LOW (ref 13.0–17.0)
MCH: 30.1 pg (ref 26.0–34.0)
MCHC: 33.9 g/dL (ref 30.0–36.0)
MCV: 88.8 fL (ref 78.0–100.0)
Platelets: 138 10*3/uL — ABNORMAL LOW (ref 150–400)
RBC: 4.28 MIL/uL (ref 4.22–5.81)
RDW: 14.1 % (ref 11.5–15.5)
WBC: 28.3 10*3/uL — ABNORMAL HIGH (ref 4.0–10.5)

## 2013-07-28 LAB — PHOSPHORUS: Phosphorus: 2.8 mg/dL (ref 2.3–4.6)

## 2013-07-28 LAB — HEMOGLOBIN A1C
Hgb A1c MFr Bld: 11.7 % — ABNORMAL HIGH (ref <5.7)
Mean Plasma Glucose: 289 mg/dL — ABNORMAL HIGH (ref <117)

## 2013-07-28 MED ORDER — INSULIN GLARGINE 100 UNIT/ML ~~LOC~~ SOLN
20.0000 [IU] | Freq: Every day | SUBCUTANEOUS | Status: DC
  Administered 2013-07-28: 20 [IU] via SUBCUTANEOUS
  Filled 2013-07-28: qty 0.2

## 2013-07-28 MED ORDER — KCL IN DEXTROSE-NACL 20-5-0.45 MEQ/L-%-% IV SOLN
INTRAVENOUS | Status: DC
  Administered 2013-07-28: 01:00:00 via INTRAVENOUS
  Filled 2013-07-28 (×3): qty 1000

## 2013-07-28 MED ORDER — SODIUM CHLORIDE 0.45 % IV SOLN
INTRAVENOUS | Status: DC
  Administered 2013-07-28 – 2013-07-29 (×2): via INTRAVENOUS
  Filled 2013-07-28 (×3): qty 100

## 2013-07-28 MED ORDER — INSULIN ASPART 100 UNIT/ML ~~LOC~~ SOLN
0.0000 [IU] | SUBCUTANEOUS | Status: DC
  Administered 2013-07-28 (×4): 8 [IU] via SUBCUTANEOUS
  Administered 2013-07-28: 5 [IU] via SUBCUTANEOUS
  Administered 2013-07-28: 8 [IU] via SUBCUTANEOUS
  Administered 2013-07-29: 11 [IU] via SUBCUTANEOUS
  Administered 2013-07-29: 3 [IU] via SUBCUTANEOUS
  Administered 2013-07-29: 2 [IU] via SUBCUTANEOUS
  Administered 2013-07-29: 3 [IU] via SUBCUTANEOUS
  Administered 2013-07-29: 11 [IU] via SUBCUTANEOUS
  Administered 2013-07-30: 8 [IU] via SUBCUTANEOUS
  Administered 2013-07-30: 3 [IU] via SUBCUTANEOUS
  Administered 2013-07-30 (×2): 5 [IU] via SUBCUTANEOUS
  Administered 2013-07-30 (×2): 3 [IU] via SUBCUTANEOUS

## 2013-07-28 MED ORDER — SODIUM CHLORIDE 0.9 % IV SOLN: 250.0000 mL | INTRAVENOUS | Status: DC | PRN

## 2013-07-28 MED ORDER — SODIUM CHLORIDE 0.9 % IV BOLUS (SEPSIS)
500.0000 mL | Freq: Once | INTRAVENOUS | Status: AC
  Administered 2013-07-28: 500 mL via INTRAVENOUS

## 2013-07-28 MED ORDER — VASOPRESSIN 20 UNIT/ML IJ SOLN
0.0300 [IU]/min | INTRAVENOUS | Status: DC
  Administered 2013-07-28: 0.03 [IU]/min via INTRAVENOUS
  Filled 2013-07-28 (×2): qty 2.5

## 2013-07-28 MED ORDER — DEXTROSE 50 % IV SOLN: 1.0000 | INTRAVENOUS | Status: DC | PRN

## 2013-07-28 MED ORDER — PRO-STAT SUGAR FREE PO LIQD
30.0000 mL | Freq: Two times a day (BID) | ORAL | Status: DC
  Administered 2013-07-28 – 2013-07-29 (×3): 30 mL
  Filled 2013-07-28 (×6): qty 30

## 2013-07-28 MED ORDER — SODIUM BICARBONATE 8.4 % IV SOLN: INTRAVENOUS | Status: DC

## 2013-07-28 MED ORDER — VITAL HIGH PROTEIN PO LIQD
1000.0000 mL | ORAL | Status: DC
  Administered 2013-07-28: 1000 mL
  Filled 2013-07-28 (×5): qty 1000

## 2013-07-28 MED ORDER — INSULIN GLARGINE 100 UNIT/ML ~~LOC~~ SOLN
10.0000 [IU] | Freq: Every day | SUBCUTANEOUS | Status: DC
  Administered 2013-07-28: 10 [IU] via SUBCUTANEOUS

## 2013-07-28 MED ORDER — INSULIN GLARGINE 100 UNIT/ML ~~LOC~~ SOLN
10.0000 [IU] | Freq: Every day | SUBCUTANEOUS | Status: DC
  Filled 2013-07-28: qty 0.1

## 2013-07-28 NOTE — Progress Notes (Signed)
Name: [name] MRN: [id number] DOB: 14-Dec-1960    ADMISSION DATE:  07/26/2013 CONSULTATION DATE:  07/26/2013  REFERRING MD :  Jeneen Rinks PRIMARY SERVICE: PCCM  CHIEF COMPLAINT:  Necrotizing fasciitis  BRIEF PATIENT DESCRIPTION: 53 y/o M admitted to [facility] with findings of necrotizing fasciitis of L neck.   SIGNIFICANT EVENTS / STUDIES:  2/19 - Admit from surgical center after a fall in parking lot, CXR c/w sub-q air concerning for nec fasc 2/20 OR for debridement of right back abscess 2/21- remains in shock  LINES / TUBES: R IJ TLC 2/19>>> ETT 2/20 >>> R rad Art line 2/19 >>>  CULTURES: BCx2 2/19>>>  UC 2/19>>> neg Wound 2/19>>> Abscess cx 2/20 >>> Anaerobic cx 2/20 >>>  ANTIBIOTICS: Vanco 2/19>>>  Zosyn 2/19>>>  Clinda 2/19>>>  SUBJECTIVE: intubated, on pressors, OR yesterday.  VITAL SIGNS: Temp:  [97.6 F (36.4 C)-99.6 F (37.6 C)] 99.6 F (37.6 C) (02/21 0416) Pulse Rate:  [58-88] 71 (02/21 0500) Resp:  [13-35] 13 (02/21 0500) BP: (62-131)/(37-67) 98/62 mmHg (02/21 0500) SpO2:  [94 %-100 %] 99 % (02/21 0500) Arterial Line BP: (86-99)/(58-77) 90/77 mmHg (02/20 0900) FiO2 (%):  [40 %-70 %] 40 % (02/21 0430) Weight:  [120.9 kg (266 lb 8.6 oz)] 120.9 kg (266 lb 8.6 oz) (02/21 0435) HEMODYNAMICS: CVP:  [3 mmHg-11 mmHg] 11 mmHg VENTILATOR SETTINGS: Vent Mode:  [-] PRVC FiO2 (%):  [40 %-70 %] 40 % Set Rate:  [14 bmp] 14 bmp Vt Set:  [550 mL-570 mL] 550 mL PEEP:  [5 cmH20] 5 cmH20 Plateau Pressure:  [15 cmH20-21 cmH20] 17 cmH20 INTAKE / OUTPUT: Intake/Output     02/20 0701 - 02/21 0700   I.V. (mL/kg) 3154.5 (26.1)   IV Piggyback 650   Total Intake(mL/kg) 3804.5 (31.5)   Urine (mL/kg/hr) 2909 (1)   Blood 200 (0.1)   Total Output 3109   Net +695.5         PHYSICAL EXAMINATION: General: Morbidly obese male lying in bed Neuro: sedated, non-responsive to voice HEENT: ETT in placed Cardiovascular: rrr, no m/r/g  Lungs: CTAB, no wheezes or crackles   Abdomen: Round/soft, bs +  Musculoskeletal: No acute deformities, 2+ edema BLE  Skin: Warm/dry   LABS:  CBC  Recent Labs Lab 07/26/13 1603 07/27/13 0420 07/27/13 1307 07/28/13 0442  WBC 23.3* 24.9*  --  28.3*  HGB 13.7 13.5 13.6 12.9*  HCT 41.0 38.8* 40.0 38.0*  PLT 228 208  --  138*   Coag's  Recent Labs Lab 07/26/13 1528  APTT 25  INR 1.34   BMET  Recent Labs Lab 07/27/13 0420 07/27/13 1307 07/27/13 1600 07/27/13 1830  NA 142 146 145 146  K 3.4* 3.8 4.2 3.7  CL 107  --  113* 114*  CO2 17*  --  20 19  BUN 72*  --  63* 58*  CREATININE 2.52*  --  2.23* 2.16*  GLUCOSE 439*  --  214* 193*   Electrolytes  Recent Labs Lab 07/26/13 1528  07/27/13 0420 07/27/13 1600 07/27/13 1830  CALCIUM 8.5  < > 8.8 8.5 8.2*  MG 2.5  --  2.6*  --   --   PHOS 6.7*  --  2.3  --   --   < > = values in this interval not displayed. Sepsis Markers  Recent Labs Lab 07/26/13 1417 07/26/13 1529  LATICACIDVEN 7.25* 4.9*   ABG  Recent Labs Lab 07/27/13 1307 07/27/13 2219 07/28/13 0446  PHART 7.248* 7.225*  7.265*  PCO2ART 51.1* 51.1* 45.3*  PO2ART 377.0* 101.0* 110.0*   Liver Enzymes  Recent Labs Lab 07/26/13 1316  AST 18  ALT 19  ALKPHOS 170*  BILITOT 0.3  ALBUMIN 2.1*   Cardiac Enzymes  Recent Labs Lab 07/26/13 2013 07/27/13 0130  TROPONINI 7.62* 12.33*   Glucose  Recent Labs Lab 07/27/13 1941 07/27/13 2046 07/27/13 2150 07/27/13 2301 07/28/13 0008 07/28/13 0415  GLUCAP 145* 141* 140* 158* 126* 201*    Imaging Dg Chest Port 1 View  07/27/2013   CLINICAL DATA:  Endotracheal tube placement.  EXAM: PORTABLE CHEST - 1 VIEW  COMPARISON:  Chest radiograph performed 07/26/2013  FINDINGS: The patient's endotracheal tube is seen ending 7 cm above the carina. An enteric tube is noted extending below the diaphragm, ending about the fundus, with the side port noted at the distal esophagus. The patient's right IJ line is seen ending about the  cavoatrial junction.  The lungs are hypoexpanded but appear grossly clear. There is no evidence of focal opacification, pleural effusion or pneumothorax.  The cardiomediastinal silhouette is borderline normal in size. A pacemaker is seen overlying the left chest wall, with leads ending overlying the right atrium and right ventricle. No acute osseous abnormalities are seen.  IMPRESSION: 1. Endotracheal tube seen ending 7 cm above the carina. This could be advanced 3-4 cm. 2. Enteric tube noted ending about the fundus, with the side port at the distal esophagus. This could be advanced approximately 4 cm. 3. Lungs hypoexpanded but grossly clear.   Electronically Signed   By: Garald Balding M.D.   On: 07/27/2013 23:18   Dg Chest Port 1 View  07/26/2013   CLINICAL DATA:  Check  central line placement  EXAM: PORTABLE CHEST - 1 VIEW  COMPARISON:  07/26/2013 1326 hrs  FINDINGS: Cardiac shadow is stable. A new right jugular central line is noted with the tip at the cavoatrial junction. The lungs are clear bilaterally. No pneumothorax is seen. Bilateral subcutaneous emphysema is noted. A pacing device is again seen. These changes are stable from the prior exam.  IMPRESSION: Status post central line placement without pneumothorax.  Generalized subcutaneous emphysema is again noted particularly on the left side.   Electronically Signed   By: Inez Catalina M.D.   On: 07/26/2013 16:08   Dg Chest Port 1 View  07/26/2013   CLINICAL DATA:  Fall, confusion and abscess of back.  EXAM: PORTABLE CHEST - 1 VIEW  COMPARISON:  DG CHEST 2 VIEW dated 06/12/2012; DG CHEST 2 VIEW dated 08/20/2011  FINDINGS: There is some subcutaneous air at the base of the left neck and extending along the lateral left chest wall. A definite pneumothorax is not identified by portable chest x-ray. Given clinical history, this may represent a necrotizing process with extensive subcutaneous air present. CT evaluation of the neck and chest with contrast would be  helpful.  No displaced rib fractures are seen. The heart size and mediastinal contours are stable. There is stable appearance of the dual chamber pacemaker.  IMPRESSION: Subcutaneous air in the left neck and left chest wall. A definite pneumothorax or pneumomediastinum is not visualized and this may represent a necrotizing infectious process given the history of abscess on the back. Recommend correlation with CT of the neck and chest with contrast.  Critical Value/emergent results were called by telephone at the time of interpretation on 07/26/2013 at 2:08 PM to [facility], PA-C, who verbally acknowledged these results.   Electronically Signed  By: Aletta Edouard M.D.   On: 07/26/2013 14:08   Dg Abd Portable 1v  07/27/2013   CLINICAL DATA:  NG tube placement  EXAM: PORTABLE ABDOMEN - 1 VIEW  COMPARISON:  07/26/2013 chest x-ray  FINDINGS: NG tube tip is in the proximal stomach. Side port is at the gastroesophageal junction. Nonspecific gaseous distention of the transverse colon. Degenerative changes of the lower thoracic spine.  IMPRESSION: NG tube in the proximal stomach.   Electronically Signed   By: Daryll Brod M.D.   On: 07/27/2013 17:35    ASSESSMENT / PLAN:  PULMONARY  A:  Intubated, uncompensatd met acidosis, Acute resp failure noted pre surgery P:  -vent FiO2 40% RR 14 Tv 550 Peep 5, abg reviewed, increase rate to 24 given pH, repeat abg in 30 min  -f/u am pcxr -am abg as well -unable to wean in shock and acidotic  CARDIOVASCULAR  A:  Septic Shock 5/57 Diastolic CHF / Hypertrophic Cardiomyopathy  HTN  Mobitz II  Hx Lymphedema LE's  Cortisol 53.8 Elevated lactic acid Elevated troponin No adrenal insuff P:  -monitor CVP -TTE today  -heparin infusion - held in setting of surgery -wean pressors as able -appreciate cards consult - echo to be done -no asa until ID done / surgery -see ID -levophed at 15 mics, (vasst trial), add vasopressin -max cvp to 12, bolus 500 cc  RENAL   A:  Lactic Acidosis improved Fabry's Disease  Chronic Renal Insufficiency - followed by Dr. Myrtie Hawk  Hypokalemia Non AG P:  -trend BMP q12h -lactic acid trending down -renal US await result -change fluids to add bicarb for NON AG   GASTROINTESTINAL  A:  Morbid Obesity  P:  -NPO -ppi -consider starting tube feeds once back from OR today -cmet in am with lft  HEMATOLOGIC  A:  DVT Proph  Anemia - ?dilutional vs surgical P:  -SCD's  -sub q hep -will discuss with cards hep drip post op ( if ok by surgery), if last cath neg , likely not helpfull   INFECTIOUS  A:  Necrotizing Fasciitis, fat, soft tissue  LU Posterior Abscess  P:  -CCS consult for surgical debridement - appreciate their help -abx as above  -follow cultures  -clinda for toxin inhibition until off pressors -return to the OR today  ENDOCRINE  A:  DM hyperglycemia Gout  P:  -lantus SSI - consider increasing once off D5 1/2 NS -monitor cbgs -will d/c D5 1/2 NS fluids and use 1/2 NS with the bicarb  NEUROLOGIC  A:  Pain, vent dysycrony P:  -fentanyl -rass 0  Tommi Rumps, MD [facility] PGY-2  TODAY'S SUMMARY: return to the OR today, continue antibiotics, wean pressors as able, add vaso, add bicarb  I have personally obtained a history, examined the patient, evaluated laboratory and imaging results, formulated the assessment and plan and placed orders. CRITICAL CARE: The patient is critically ill with multiple organ systems failure and requires high complexity decision making for assessment and support, frequent evaluation and titration of therapies, application of advanced monitoring technologies and extensive interpretation of multiple databases. Critical Care Time devoted to patient care services described in this note is 30 minutes.   Lavon Paganini. Titus Mould, MD, FACP Pgr: [facility]  Pulmonary and [facility] Pager: [phone]  07/28/2013, 6:39 AM

## 2013-07-28 NOTE — Progress Notes (Signed)
RT advanced ETT 3cm per MD order. No complications. Vitals stable at this time. Rt will continue to monitor.

## 2013-07-28 NOTE — Progress Notes (Signed)
1 Day Post-Op  Subjective: Critically ill intubated  Objective: Vital signs in last 24 hours: Temp:  [98 F (36.7 C)-99.6 F (37.6 C)] 98.9 F (37.2 C) (02/21 0740) Pulse Rate:  [58-88] 81 (02/21 0900) Resp:  [13-30] 24 (02/21 0900) BP: (94-131)/(50-69) 127/69 mmHg (02/21 0900) SpO2:  [95 %-100 %] 99 % (02/21 0900) FiO2 (%):  [40 %-70 %] 40 % (02/21 0846) Weight:  [266 lb 8.6 oz (120.9 kg)] 266 lb 8.6 oz (120.9 kg) (02/21 0435) Last BM Date: 07/26/13  Intake/Output from previous day: 02/20 0701 - 02/21 0700 In: 4121.3 [I.V.:3471.3; IV Piggyback:650] Out: 3109 [Urine:2909; Blood:200] Intake/Output this shift:    Incision/Wound:back wound open and foul smelling.  No significant drainage.  Muscle viable and intact.  Skin edges intact.  No pockets or tunneling noted. No fluctuance.  Some necrotic debris noted less than 10 %.   Lab Results:   Recent Labs  07/27/13 0420 07/27/13 1307 07/28/13 0442  WBC 24.9*  --  28.3*  HGB 13.5 13.6 12.9*  HCT 38.8* 40.0 38.0*  PLT 208  --  138*   BMET  Recent Labs  07/27/13 1830 07/28/13 0800  NA 146 147  K 3.7 4.6  CL 114* 112  CO2 19 19  GLUCOSE 193* 330*  BUN 58* 55*  CREATININE 2.16* 2.26*  CALCIUM 8.2* 8.2*   PT/INR  Recent Labs  07/26/13 1528  LABPROT 16.3*  INR 1.34   ABG  Recent Labs  07/27/13 2219 07/28/13 0446  PHART 7.225* 7.265*  HCO3 21.2 20.6    Studies/Results: US Renal  07/28/2013   CLINICAL DATA:  Chronic renal insufficiency.  EXAM: RENAL/URINARY TRACT ULTRASOUND COMPLETE  COMPARISON:  CT scan dated 04/04/2009  FINDINGS: Right Kidney:  Length: 12.2 cm. Echogenicity within normal limits. No mass or hydronephrosis visualized.  Left Kidney:  Length: 12.7 cm. Echogenicity within normal limits. No mass or hydronephrosis visualized.  Bladder:  Foley catheter in place. The bladder wall appears slightly thickened.  IMPRESSION: Normal appearing kidneys.   Electronically Signed   By: Rozetta Nunnery M.D.   On:  07/28/2013 08:57   Dg Chest Port 1 View  07/28/2013   CLINICAL DATA:  Intubation.  EXAM: PORTABLE CHEST - 1 VIEW  COMPARISON:  07/27/2013  FINDINGS: The tip of the endotracheal tube projects at the inferior margin of the clavicular heads, approximately 8 cm above the carina and not significantly changed. Right jugular central venous catheter remains in place with tip overlying the lower SVC. Enteric tube courses into the left upper abdomen with tip not imaged. Left-sided dual lead pacemaker is partially imaged. Cardiac silhouette appears upper limits of normal in size, unchanged. Lung volumes remain low. There is no evidence of airspace consolidation, edema, or definite pleural effusion. No pneumothorax is identified.  IMPRESSION: 1. Unchanged appearance of endotracheal tube and CVC. 2. Mildly low lung volumes without evidence of acute airspace disease.   Electronically Signed   By: Logan Bores   On: 07/28/2013 09:06   Dg Chest Port 1 View  07/27/2013   CLINICAL DATA:  Endotracheal tube placement.  EXAM: PORTABLE CHEST - 1 VIEW  COMPARISON:  Chest radiograph performed 07/26/2013  FINDINGS: The patient's endotracheal tube is seen ending 7 cm above the carina. An enteric tube is noted extending below the diaphragm, ending about the fundus, with the side port noted at the distal esophagus. The patient's right IJ line is seen ending about the cavoatrial junction.  The lungs are hypoexpanded but  appear grossly clear. There is no evidence of focal opacification, pleural effusion or pneumothorax.  The cardiomediastinal silhouette is borderline normal in size. A pacemaker is seen overlying the left chest wall, with leads ending overlying the right atrium and right ventricle. No acute osseous abnormalities are seen.  IMPRESSION: 1. Endotracheal tube seen ending 7 cm above the carina. This could be advanced 3-4 cm. 2. Enteric tube noted ending about the fundus, with the side port at the distal esophagus. This could be  advanced approximately 4 cm. 3. Lungs hypoexpanded but grossly clear.   Electronically Signed   By: Garald Balding M.D.   On: 07/27/2013 23:18   Dg Chest Port 1 View  07/26/2013   CLINICAL DATA:  Check  central line placement  EXAM: PORTABLE CHEST - 1 VIEW  COMPARISON:  07/26/2013 1326 hrs  FINDINGS: Cardiac shadow is stable. A new right jugular central line is noted with the tip at the cavoatrial junction. The lungs are clear bilaterally. No pneumothorax is seen. Bilateral subcutaneous emphysema is noted. A pacing device is again seen. These changes are stable from the prior exam.  IMPRESSION: Status post central line placement without pneumothorax.  Generalized subcutaneous emphysema is again noted particularly on the left side.   Electronically Signed   By: Inez Catalina M.D.   On: 07/26/2013 16:08   Dg Chest Port 1 View  07/26/2013   CLINICAL DATA:  Fall, confusion and abscess of back.  EXAM: PORTABLE CHEST - 1 VIEW  COMPARISON:  DG CHEST 2 VIEW dated 06/12/2012; DG CHEST 2 VIEW dated 08/20/2011  FINDINGS: There is some subcutaneous air at the base of the left neck and extending along the lateral left chest wall. A definite pneumothorax is not identified by portable chest x-ray. Given clinical history, this may represent a necrotizing process with extensive subcutaneous air present. CT evaluation of the neck and chest with contrast would be helpful.  No displaced rib fractures are seen. The heart size and mediastinal contours are stable. There is stable appearance of the dual chamber pacemaker.  IMPRESSION: Subcutaneous air in the left neck and left chest wall. A definite pneumothorax or pneumomediastinum is not visualized and this may represent a necrotizing infectious process given the history of abscess on the back. Recommend correlation with CT of the neck and chest with contrast.  Critical Value/emergent results were called by telephone at the time of interpretation on 07/26/2013 at 2:08 PM to [facility],  PA-C, who verbally acknowledged these results.   Electronically Signed   By: Aletta Edouard M.D.   On: 07/26/2013 14:08   Dg Abd Portable 1v  07/27/2013   CLINICAL DATA:  NG tube placement  EXAM: PORTABLE ABDOMEN - 1 VIEW  COMPARISON:  07/26/2013 chest x-ray  FINDINGS: NG tube tip is in the proximal stomach. Side port is at the gastroesophageal junction. Nonspecific gaseous distention of the transverse colon. Degenerative changes of the lower thoracic spine.  IMPRESSION: NG tube in the proximal stomach.   Electronically Signed   By: Daryll Brod M.D.   On: 07/27/2013 17:35    Anti-infectives: Anti-infectives   Start     Dose/Rate Route Frequency Ordered Stop   07/27/13 0000  vancomycin (VANCOCIN) IVPB 1000 mg/200 mL premix     1,000 mg 200 mL/hr over 60 Minutes Intravenous Every 12 hours 07/26/13 1329     07/26/13 2200  piperacillin-tazobactam (ZOSYN) IVPB 3.375 g     3.375 g 12.5 mL/hr over 240 Minutes Intravenous Every  8 hours 07/26/13 1329     07/26/13 1630  clindamycin (CLEOCIN) IVPB 600 mg     600 mg 100 mL/hr over 30 Minutes Intravenous 3 times per day 07/26/13 1556     07/26/13 1330  piperacillin-tazobactam (ZOSYN) IVPB 3.375 g     3.375 g 100 mL/hr over 30 Minutes Intravenous  Once 07/26/13 1316 07/26/13 1426   07/26/13 1330  vancomycin (VANCOCIN) IVPB 1000 mg/200 mL premix     1,000 mg 200 mL/hr over 60 Minutes Intravenous  Once 07/26/13 1316 07/26/13 1516   07/26/13 1330  vancomycin (VANCOCIN) 500 mg in sodium chloride 0.9 % 100 mL IVPB     500 mg 100 mL/hr over 60 Minutes Intravenous  Once 07/26/13 1329 07/26/13 1721      Assessment/Plan: s/p Procedure(s): IRRIGATION AND DEBRIDEMENT OF SKIN, SOFT TISSUE AND MUSCLES OF UPPER BACK (11X19X4cm) WITH 10 BLADE AND PULSATILE LAVAGE  (N/A) Wound with viable edges and muscle without evidence on any significant dead tissue or major necrosis.  Continue wound care and reassess Sunday.  Will not debride today.    LOS: 2 days     Sharea Guinther A. 07/28/2013

## 2013-07-28 NOTE — Progress Notes (Signed)
SUBJECTIVE:  Intubated and sedated  OBJECTIVE:   Vitals:   Filed Vitals:   07/28/13 0740 07/28/13 0800 07/28/13 0845 07/28/13 0900  BP:  101/63 111/57 127/69  Pulse:  85 88 81  Temp: 98.9 F (37.2 C)     TempSrc: Oral     Resp:  14 24 24   Height:      Weight:      SpO2:  99% 100% 99%   I&O's:   Intake/Output Summary (Last 24 hours) at 07/28/13 1034 Last data filed at 07/28/13 0700  Gross per 24 hour  Intake 3595.05 ml  Output   2984 ml  Net 611.05 ml   TELEMETRY: Reviewed telemetry pt in NSR     PHYSICAL EXAM Lungs:   Clear bilaterally to auscultation and percussion. Heart:   HRRR S1 S2 Pulses are 2+ & equal. Abdomen: Bowel sounds are positive, abdomen soft and non-tender without masses  Extremities:   No clubbing, cyanosis or edema.  DP +1  LABS: Basic Metabolic Panel:  Recent Labs  07/26/13 1528  07/27/13 0420  07/27/13 1830 07/28/13 0800  NA 130*  < > 142  < > 146 147  K 5.3  < > 3.4*  < > 3.7 4.6  CL 92*  < > 107  < > 114* 112  CO2 14*  < > 17*  < > 19 19  GLUCOSE 1085*  < > 439*  < > 193* 330*  BUN 78*  < > 72*  < > 58* 55*  CREATININE 2.65*  < > 2.52*  < > 2.16* 2.26*  CALCIUM 8.5  < > 8.8  < > 8.2* 8.2*  MG 2.5  --  2.6*  --   --   --   PHOS 6.7*  --  2.3  --   --   --   < > = values in this interval not displayed. Liver Function Tests:  Recent Labs  07/26/13 1316  AST 18  ALT 19  ALKPHOS 170*  BILITOT 0.3  PROT 7.3  ALBUMIN 2.1*   No results found for this basename: LIPASE, AMYLASE,  in the last 72 hours CBC:  Recent Labs  07/26/13 1603 07/27/13 0420 07/27/13 1307 07/28/13 0442  WBC 23.3* 24.9*  --  28.3*  NEUTROABS 20.5*  --   --   --   HGB 13.7 13.5 13.6 12.9*  HCT 41.0 38.8* 40.0 38.0*  MCV 90.9 85.3  --  88.8  PLT 228 208  --  138*   Cardiac Enzymes:  Recent Labs  07/26/13 2013 07/27/13 0130  TROPONINI 7.62* 12.33*   BNP: No components found with this basename: POCBNP,  D-Dimer: No results found for this  basename: DDIMER,  in the last 72 hours Hemoglobin A1C: No results found for this basename: HGBA1C,  in the last 72 hours Fasting Lipid Panel: No results found for this basename: CHOL, HDL, LDLCALC, TRIG, CHOLHDL, LDLDIRECT,  in the last 72 hours Thyroid Function Tests: No results found for this basename: TSH, T4TOTAL, FREET3, T3FREE, THYROIDAB,  in the last 72 hours Anemia Panel: No results found for this basename: VITAMINB12, FOLATE, FERRITIN, TIBC, IRON, RETICCTPCT,  in the last 72 hours Coag Panel:   Lab Results  Component Value Date   INR 1.34 07/26/2013   INR 0.97 02/11/2011   INR 0.95 07/31/2010    RADIOLOGY: US Renal  07/28/2013   CLINICAL DATA:  Chronic renal insufficiency.  EXAM: RENAL/URINARY TRACT ULTRASOUND COMPLETE  COMPARISON:  CT  scan dated 04/04/2009  FINDINGS: Right Kidney:  Length: 12.2 cm. Echogenicity within normal limits. No mass or hydronephrosis visualized.  Left Kidney:  Length: 12.7 cm. Echogenicity within normal limits. No mass or hydronephrosis visualized.  Bladder:  Foley catheter in place. The bladder wall appears slightly thickened.  IMPRESSION: Normal appearing kidneys.   Electronically Signed   By: Rozetta Nunnery M.D.   On: 07/28/2013 08:57   Dg Chest Port 1 View  07/28/2013   CLINICAL DATA:  Intubation.  EXAM: PORTABLE CHEST - 1 VIEW  COMPARISON:  07/27/2013  FINDINGS: The tip of the endotracheal tube projects at the inferior margin of the clavicular heads, approximately 8 cm above the carina and not significantly changed. Right jugular central venous catheter remains in place with tip overlying the lower SVC. Enteric tube courses into the left upper abdomen with tip not imaged. Left-sided dual lead pacemaker is partially imaged. Cardiac silhouette appears upper limits of normal in size, unchanged. Lung volumes remain low. There is no evidence of airspace consolidation, edema, or definite pleural effusion. No pneumothorax is identified.  IMPRESSION: 1. Unchanged  appearance of endotracheal tube and CVC. 2. Mildly low lung volumes without evidence of acute airspace disease.   Electronically Signed   By: Logan Bores   On: 07/28/2013 09:06   Dg Chest Port 1 View  07/27/2013   CLINICAL DATA:  Endotracheal tube placement.  EXAM: PORTABLE CHEST - 1 VIEW  COMPARISON:  Chest radiograph performed 07/26/2013  FINDINGS: The patient's endotracheal tube is seen ending 7 cm above the carina. An enteric tube is noted extending below the diaphragm, ending about the fundus, with the side port noted at the distal esophagus. The patient's right IJ line is seen ending about the cavoatrial junction.  The lungs are hypoexpanded but appear grossly clear. There is no evidence of focal opacification, pleural effusion or pneumothorax.  The cardiomediastinal silhouette is borderline normal in size. A pacemaker is seen overlying the left chest wall, with leads ending overlying the right atrium and right ventricle. No acute osseous abnormalities are seen.  IMPRESSION: 1. Endotracheal tube seen ending 7 cm above the carina. This could be advanced 3-4 cm. 2. Enteric tube noted ending about the fundus, with the side port at the distal esophagus. This could be advanced approximately 4 cm. 3. Lungs hypoexpanded but grossly clear.   Electronically Signed   By: Garald Balding M.D.   On: 07/27/2013 23:18   Dg Chest Port 1 View  07/26/2013   CLINICAL DATA:  Check  central line placement  EXAM: PORTABLE CHEST - 1 VIEW  COMPARISON:  07/26/2013 1326 hrs  FINDINGS: Cardiac shadow is stable. A new right jugular central line is noted with the tip at the cavoatrial junction. The lungs are clear bilaterally. No pneumothorax is seen. Bilateral subcutaneous emphysema is noted. A pacing device is again seen. These changes are stable from the prior exam.  IMPRESSION: Status post central line placement without pneumothorax.  Generalized subcutaneous emphysema is again noted particularly on the left side.    Electronically Signed   By: Inez Catalina M.D.   On: 07/26/2013 16:08   Dg Chest Port 1 View  07/26/2013   CLINICAL DATA:  Fall, confusion and abscess of back.  EXAM: PORTABLE CHEST - 1 VIEW  COMPARISON:  DG CHEST 2 VIEW dated 06/12/2012; DG CHEST 2 VIEW dated 08/20/2011  FINDINGS: There is some subcutaneous air at the base of the left neck and extending along the lateral left  chest wall. A definite pneumothorax is not identified by portable chest x-ray. Given clinical history, this may represent a necrotizing process with extensive subcutaneous air present. CT evaluation of the neck and chest with contrast would be helpful.  No displaced rib fractures are seen. The heart size and mediastinal contours are stable. There is stable appearance of the dual chamber pacemaker.  IMPRESSION: Subcutaneous air in the left neck and left chest wall. A definite pneumothorax or pneumomediastinum is not visualized and this may represent a necrotizing infectious process given the history of abscess on the back. Recommend correlation with CT of the neck and chest with contrast.  Critical Value/emergent results were called by telephone at the time of interpretation on 07/26/2013 at 2:08 PM to [facility], PA-C, who verbally acknowledged these results.   Electronically Signed   By: Aletta Edouard M.D.   On: 07/26/2013 14:08   Dg Abd Portable 1v  07/27/2013   CLINICAL DATA:  NG tube placement  EXAM: PORTABLE ABDOMEN - 1 VIEW  COMPARISON:  07/26/2013 chest x-ray  FINDINGS: NG tube tip is in the proximal stomach. Side port is at the gastroesophageal junction. Nonspecific gaseous distention of the transverse colon. Degenerative changes of the lower thoracic spine.  IMPRESSION: NG tube in the proximal stomach.   Electronically Signed   By: Daryll Brod M.D.   On: 07/27/2013 17:35   ASSESSMENT:  1. NSTEMI type II, presumed related to shock in setting of hypertrophic CM.  2. Necrotizing fasciitis  3. CKD and anticipate acute  superimposed injury  4. Continued profound hypotension   Plan:  1. Continue aggressive support  2. Check echo to assess LV and valves   Sueanne Margarita, MD  07/28/2013  10:34 AM

## 2013-07-29 ENCOUNTER — Inpatient Hospital Stay (HOSPITAL_COMMUNITY): Payer: Medicare Other

## 2013-07-29 DIAGNOSIS — I369 Nonrheumatic tricuspid valve disorder, unspecified: Secondary | ICD-10-CM

## 2013-07-29 DIAGNOSIS — I4891 Unspecified atrial fibrillation: Secondary | ICD-10-CM | POA: Diagnosis not present

## 2013-07-29 DIAGNOSIS — Z950 Presence of cardiac pacemaker: Secondary | ICD-10-CM

## 2013-07-29 DIAGNOSIS — Z9911 Dependence on respirator [ventilator] status: Secondary | ICD-10-CM | POA: Diagnosis not present

## 2013-07-29 DIAGNOSIS — J449 Chronic obstructive pulmonary disease, unspecified: Secondary | ICD-10-CM | POA: Diagnosis not present

## 2013-07-29 DIAGNOSIS — N289 Disorder of kidney and ureter, unspecified: Secondary | ICD-10-CM | POA: Diagnosis not present

## 2013-07-29 DIAGNOSIS — J9600 Acute respiratory failure, unspecified whether with hypoxia or hypercapnia: Secondary | ICD-10-CM | POA: Diagnosis not present

## 2013-07-29 DIAGNOSIS — L723 Sebaceous cyst: Secondary | ICD-10-CM | POA: Diagnosis not present

## 2013-07-29 DIAGNOSIS — I5032 Chronic diastolic (congestive) heart failure: Secondary | ICD-10-CM | POA: Diagnosis not present

## 2013-07-29 DIAGNOSIS — I10 Essential (primary) hypertension: Secondary | ICD-10-CM | POA: Diagnosis not present

## 2013-07-29 LAB — CBC
HCT: 32.9 % — ABNORMAL LOW (ref 39.0–52.0)
Hemoglobin: 11.1 g/dL — ABNORMAL LOW (ref 13.0–17.0)
MCH: 29.9 pg (ref 26.0–34.0)
MCHC: 33.7 g/dL (ref 30.0–36.0)
MCV: 88.7 fL (ref 78.0–100.0)
Platelets: 100 10*3/uL — ABNORMAL LOW (ref 150–400)
RBC: 3.71 MIL/uL — ABNORMAL LOW (ref 4.22–5.81)
RDW: 14.3 % (ref 11.5–15.5)
WBC: 25.4 10*3/uL — ABNORMAL HIGH (ref 4.0–10.5)

## 2013-07-29 LAB — BLOOD GAS, ARTERIAL
Acid-base deficit: 1.7 mmol/L (ref 0.0–2.0)
Bicarbonate: 21.3 mEq/L (ref 20.0–24.0)
Drawn by: 283401
FIO2: 0.4 %
MECHVT: 550 mL
O2 Saturation: 99.4 %
PEEP: 5 cmH2O
Patient temperature: 98.6
RATE: 32 resp/min
TCO2: 22.2 mmol/L (ref 0–100)
pCO2 arterial: 28.6 mmHg — ABNORMAL LOW (ref 35.0–45.0)
pH, Arterial: 7.485 — ABNORMAL HIGH (ref 7.350–7.450)
pO2, Arterial: 136 mmHg — ABNORMAL HIGH (ref 80.0–100.0)

## 2013-07-29 LAB — GLUCOSE, CAPILLARY
Glucose-Capillary: 141 mg/dL — ABNORMAL HIGH (ref 70–99)
Glucose-Capillary: 162 mg/dL — ABNORMAL HIGH (ref 70–99)
Glucose-Capillary: 197 mg/dL — ABNORMAL HIGH (ref 70–99)
Glucose-Capillary: 269 mg/dL — ABNORMAL HIGH (ref 70–99)
Glucose-Capillary: 279 mg/dL — ABNORMAL HIGH (ref 70–99)
Glucose-Capillary: 303 mg/dL — ABNORMAL HIGH (ref 70–99)
Glucose-Capillary: 306 mg/dL — ABNORMAL HIGH (ref 70–99)

## 2013-07-29 LAB — COMPREHENSIVE METABOLIC PANEL
ALT: 14 U/L (ref 0–53)
AST: 17 U/L (ref 0–37)
Albumin: 1.5 g/dL — ABNORMAL LOW (ref 3.5–5.2)
Alkaline Phosphatase: 133 U/L — ABNORMAL HIGH (ref 39–117)
BUN: 50 mg/dL — ABNORMAL HIGH (ref 6–23)
CO2: 21 mEq/L (ref 19–32)
Calcium: 8 mg/dL — ABNORMAL LOW (ref 8.4–10.5)
Chloride: 113 mEq/L — ABNORMAL HIGH (ref 96–112)
Creatinine, Ser: 1.97 mg/dL — ABNORMAL HIGH (ref 0.50–1.35)
GFR calc Af Amer: 43 mL/min — ABNORMAL LOW (ref >90)
GFR calc non Af Amer: 37 mL/min — ABNORMAL LOW (ref >90)
Glucose, Bld: 352 mg/dL — ABNORMAL HIGH (ref 70–99)
Potassium: 3.7 mEq/L (ref 3.7–5.3)
Sodium: 147 mEq/L (ref 137–147)
Total Bilirubin: 0.5 mg/dL (ref 0.3–1.2)
Total Protein: 5.7 g/dL — ABNORMAL LOW (ref 6.0–8.3)

## 2013-07-29 LAB — BASIC METABOLIC PANEL
BUN: 49 mg/dL — ABNORMAL HIGH (ref 6–23)
BUN: 46 mg/dL — ABNORMAL HIGH (ref 6–23)
CO2: 23 mEq/L (ref 19–32)
CO2: 22 mEq/L (ref 19–32)
Calcium: 7.8 mg/dL — ABNORMAL LOW (ref 8.4–10.5)
Calcium: 7.9 mg/dL — ABNORMAL LOW (ref 8.4–10.5)
Chloride: 116 mEq/L — ABNORMAL HIGH (ref 96–112)
Chloride: 113 mEq/L — ABNORMAL HIGH (ref 96–112)
Creatinine, Ser: 2.08 mg/dL — ABNORMAL HIGH (ref 0.50–1.35)
Creatinine, Ser: 1.99 mg/dL — ABNORMAL HIGH (ref 0.50–1.35)
GFR calc Af Amer: 40 mL/min — ABNORMAL LOW (ref >90)
GFR calc Af Amer: 43 mL/min — ABNORMAL LOW (ref >90)
GFR calc non Af Amer: 35 mL/min — ABNORMAL LOW (ref >90)
GFR calc non Af Amer: 37 mL/min — ABNORMAL LOW (ref >90)
Glucose, Bld: 268 mg/dL — ABNORMAL HIGH (ref 70–99)
Glucose, Bld: 210 mg/dL — ABNORMAL HIGH (ref 70–99)
Potassium: 3.6 mEq/L — ABNORMAL LOW (ref 3.7–5.3)
Potassium: 3.8 mEq/L (ref 3.7–5.3)
Sodium: 152 mEq/L — ABNORMAL HIGH (ref 137–147)
Sodium: 150 mEq/L — ABNORMAL HIGH (ref 137–147)

## 2013-07-29 LAB — POCT I-STAT 3, ART BLOOD GAS (G3+)
Acid-base deficit: 3 mmol/L — ABNORMAL HIGH (ref 0.0–2.0)
Bicarbonate: 23 mEq/L (ref 20.0–24.0)
O2 Saturation: 99 %
Patient temperature: 99.3
TCO2: 24 mmol/L (ref 0–100)
pCO2 arterial: 42.8 mmHg (ref 35.0–45.0)
pH, Arterial: 7.339 — ABNORMAL LOW (ref 7.350–7.450)
pO2, Arterial: 127 mmHg — ABNORMAL HIGH (ref 80.0–100.0)

## 2013-07-29 LAB — PROTIME-INR
INR: 1.27 (ref 0.00–1.49)
Prothrombin Time: 15.6 seconds — ABNORMAL HIGH (ref 11.6–15.2)

## 2013-07-29 LAB — MAGNESIUM
Magnesium: 2.1 mg/dL (ref 1.5–2.5)
Magnesium: 2 mg/dL (ref 1.5–2.5)

## 2013-07-29 LAB — PHOSPHORUS
Phosphorus: 1.7 mg/dL — ABNORMAL LOW (ref 2.3–4.6)
Phosphorus: 4.4 mg/dL (ref 2.3–4.6)

## 2013-07-29 MED ORDER — FREE WATER
150.0000 mL | Freq: Three times a day (TID) | Status: DC
  Administered 2013-07-29: 150 mL

## 2013-07-29 MED ORDER — HEPARIN SODIUM (PORCINE) 5000 UNIT/ML IJ SOLN
5000.0000 [IU] | Freq: Three times a day (TID) | INTRAMUSCULAR | Status: DC
  Administered 2013-07-29 (×2): 5000 [IU] via SUBCUTANEOUS
  Filled 2013-07-29 (×4): qty 1

## 2013-07-29 MED ORDER — INSULIN GLARGINE 100 UNIT/ML ~~LOC~~ SOLN
30.0000 [IU] | Freq: Every day | SUBCUTANEOUS | Status: DC
  Administered 2013-07-29: 30 [IU] via SUBCUTANEOUS
  Filled 2013-07-29: qty 0.3

## 2013-07-29 MED ORDER — ACETAMINOPHEN 160 MG/5ML PO SOLN
650.0000 mg | Freq: Four times a day (QID) | ORAL | Status: DC | PRN
  Administered 2013-07-29: 650 mg
  Filled 2013-07-29: qty 20.3

## 2013-07-29 MED ORDER — SODIUM CHLORIDE 0.45 % IV SOLN
INTRAVENOUS | Status: DC
  Administered 2013-07-29 – 2013-07-30 (×3): via INTRAVENOUS

## 2013-07-29 MED ORDER — SODIUM CHLORIDE 0.9 % IV BOLUS (SEPSIS)
1000.0000 mL | Freq: Once | INTRAVENOUS | Status: AC
  Administered 2013-07-29: 1000 mL via INTRAVENOUS

## 2013-07-29 MED ORDER — SODIUM PHOSPHATE 3 MMOLE/ML IV SOLN
30.0000 mmol | Freq: Once | INTRAVENOUS | Status: AC
  Administered 2013-07-29: 30 mmol via INTRAVENOUS
  Filled 2013-07-29: qty 10

## 2013-07-29 NOTE — Progress Notes (Signed)
ANTIBIOTIC CONSULT NOTE - FOLLOW UP  Pharmacy Consult for vancomycin, clindamycin, Zosyn Indication: sepsis  Allergies  Allergen Reactions  . Shellfish Allergy Hives and Swelling    Patient Measurements: Height: 5\' 8"  (172.7 cm) Weight: 272 lb 0.8 oz (123.4 kg) IBW/kg (Calculated) : 68.4   Vital Signs: Temp: 101.1 F (38.4 C) (02/22 0410) Temp src: Oral (02/22 0410) BP: 102/53 mmHg (02/22 1245) Pulse Rate: 80 (02/22 1245) Intake/Output from previous day: 02/21 0701 - 02/22 0700 In: 3168.9 [I.V.:2366.6; NG/GT:59.3; IV Piggyback:743] Out: 2345 [Urine:2345] Intake/Output from this shift: Total I/O In: 412 [I.V.:19; NG/GT:150; IV Piggyback:243] Out: 150 [Urine:150]  Labs:  Recent Labs  07/27/13 0420 07/27/13 1307  07/28/13 0442  07/28/13 1840 07/29/13 0134 07/29/13 1000  WBC 24.9*  --   --  28.3*  --   --  25.4*  --   HGB 13.5 13.6  --  12.9*  --   --  11.1*  --   PLT 208  --   --  138*  --   --  100*  --   CREATININE 2.52*  --   < >  --   < > 2.11* 1.97* 2.08*  < > = values in this interval not displayed. Estimated Creatinine Clearance: 53.1 ml/min (by C-G formula based on Cr of 2.08). No results found for this basename: VANCOTROUGH, VANCOPEAK, VANCORANDOM, [place], GENTPEAK, GENTRANDOM, TOBRATROUGH, TOBRAPEAK, TOBRARND, AMIKACINPEAK, AMIKACINTROU, AMIKACIN,  in the last 72 hours   Microbiology: Recent Results (from the past 720 hour(s))  CULTURE, BLOOD (ROUTINE X 2)     Status: None   Collection Time    07/26/13  1:37 PM      Result Value Ref Range Status   Specimen Description BLOOD ARM RIGHT   Final   Special Requests BOTTLES DRAWN AEROBIC AND ANAEROBIC 3CC   Final   Culture  Setup Time     Final   Value: 07/26/2013 16:35     Performed at [facility]   Culture     Final   Value:        BLOOD CULTURE RECEIVED NO GROWTH TO DATE CULTURE WILL BE HELD FOR 5 DAYS BEFORE ISSUING A FINAL NEGATIVE REPORT     Performed at [facility]   Report  Status PENDING   Incomplete  CULTURE, BLOOD (ROUTINE X 2)     Status: None   Collection Time    07/26/13  1:53 PM      Result Value Ref Range Status   Specimen Description BLOOD HAND LEFT   Final   Special Requests BOTTLES DRAWN AEROBIC ONLY 2CC   Final   Culture  Setup Time     Final   Value: 07/26/2013 16:35     Performed at [facility]   Culture     Final   Value:        BLOOD CULTURE RECEIVED NO GROWTH TO DATE CULTURE WILL BE HELD FOR 5 DAYS BEFORE ISSUING A FINAL NEGATIVE REPORT     Performed at [facility]   Report Status PENDING   Incomplete  URINE CULTURE     Status: None   Collection Time    07/26/13  3:16 PM      Result Value Ref Range Status   Specimen Description URINE, CATHETERIZED   Final   Special Requests NONE   Final   Culture  Setup Time     Final   Value: 07/26/2013 21:21     Performed  at [place]     Final   Value: NO GROWTH     Performed at [facility]   Culture     Final   Value: NO GROWTH     Performed at [facility]   Report Status 07/27/2013 FINAL   Final  MRSA PCR SCREENING     Status: None   Collection Time    07/26/13  5:06 PM      Result Value Ref Range Status   MRSA by PCR NEGATIVE  NEGATIVE Final   Comment:            The GeneXpert MRSA Assay (FDA     approved for NASAL specimens     only), is one component of a     comprehensive MRSA colonization     surveillance program. It is not     intended to diagnose MRSA     infection nor to guide or     monitor treatment for     MRSA infections.  ANAEROBIC CULTURE     Status: None   Collection Time    07/27/13 12:10 PM      Result Value Ref Range Status   Specimen Description ABSCESS BACK   Final   Special Requests PT ON VANCO ZOSYN   Final   Gram Stain     Final   Value: NO WBC SEEN     NO SQUAMOUS EPITHELIAL CELLS SEEN     ABUNDANT GRAM POSITIVE COCCI IN PAIRS     Performed at [facility]   Culture     Final    Value: NO ANAEROBES ISOLATED; CULTURE IN PROGRESS FOR 5 DAYS     Performed at [facility]   Report Status PENDING   Incomplete  CULTURE, ROUTINE-ABSCESS     Status: None   Collection Time    07/27/13 12:10 PM      Result Value Ref Range Status   Specimen Description ABSCESS BACK   Final   Special Requests PT ON VANCO ZOSYN   Final   Gram Stain     Final   Value: NO WBC SEEN     NO SQUAMOUS EPITHELIAL CELLS SEEN     ABUNDANT GRAM POSITIVE COCCI IN PAIRS     Performed at [facility]   Culture     Final   Value: NO GROWTH 2 DAYS     Performed at [facility]   Report Status PENDING   Incomplete  TISSUE CULTURE     Status: None   Collection Time    07/27/13 12:20 PM      Result Value Ref Range Status   Specimen Description TISSUE BACK   Final   Special Requests PATIENT ON FOLLOWING VANCOMYCIN ZOSYN   Final   Gram Stain     Final   Value: FEW WBC PRESENT,BOTH PMN AND MONONUCLEAR     ABUNDANT GRAM POSITIVE COCCI IN PAIRS     Performed at [facility]   Culture     Final   Value: NO GROWTH 2 DAYS     Performed at [facility]   Report Status PENDING   Incomplete    Anti-infectives   Start     Dose/Rate Route Frequency Ordered Stop   07/27/13 0000  vancomycin (VANCOCIN) IVPB 1000 mg/200 mL premix     1,000 mg 200 mL/hr over 60 Minutes Intravenous Every 12 hours 07/26/13 1329  07/26/13 2200  piperacillin-tazobactam (ZOSYN) IVPB 3.375 g     3.375 g 12.5 mL/hr over 240 Minutes Intravenous Every 8 hours 07/26/13 1329     07/26/13 1630  clindamycin (CLEOCIN) IVPB 600 mg     600 mg 100 mL/hr over 30 Minutes Intravenous 3 times per day 07/26/13 1556     07/26/13 1330  piperacillin-tazobactam (ZOSYN) IVPB 3.375 g     3.375 g 100 mL/hr over 30 Minutes Intravenous  Once 07/26/13 1316 07/26/13 1426   07/26/13 1330  vancomycin (VANCOCIN) IVPB 1000 mg/200 mL premix     1,000 mg 200 mL/hr over 60 Minutes Intravenous  Once 07/26/13 1316 07/26/13  1516   07/26/13 1330  vancomycin (VANCOCIN) 500 mg in sodium chloride 0.9 % 100 mL IVPB     500 mg 100 mL/hr over 60 Minutes Intravenous  Once 07/26/13 1329 07/26/13 1721      Assessment: 89 YOM s/p I&D of infected sebaceous cyst on 2/20 being treated with empiric antibiotics for sepsis. WBC today 25.4 and Tmax/24 hours 101.1. Wound and tissue cultures have grown out Gm pos cocci in pairs, no growth on blood cultures yet. Renal function has improved, but has been fluctuating over the past several days. Current SCr  2.08 with est CrCl ~7mL/min.  Goal of Therapy:  Vancomycin trough level 15-20 mcg/ml  Plan:  1. Clindamycin 600mg  IV q8h 2. Zosyn 3.375gm IV q8h EI 3. Vanc 1000mg  IV q12 4. Will check vancomycin trough tomorrow morning at 1130 5. Continue to follow c/s, renal function, clinical progression, LOT  Layla Kesling D. Chan Rosasco, PharmD, BCPS Clinical Pharmacist Pager: [phone] 07/29/2013 1:29 PM

## 2013-07-29 NOTE — Progress Notes (Signed)
Unable to obtain history due to patient confusion

## 2013-07-29 NOTE — Progress Notes (Signed)
Echocardiogram 2D Echocardiogram has been performed.  [name], [name] 07/29/2013, 10:18 AM

## 2013-07-29 NOTE — Progress Notes (Signed)
Self -extubated. No distress . Vitals stable. Placed on 4 liters nasal cannula. Dr Titus Mould made aware. Family made aware. Blood gas ordered> unremarkable. Will cont to monitor.

## 2013-07-29 NOTE — Progress Notes (Signed)
[place] Progress Note Patient Name: [name] DOB: December 21, 1960 MRN: [id number]  Date of Service  07/29/2013   HPI/Events of Note   Hypophosphatemia  eICU Interventions  Phos replaced   Intervention Category Minor Interventions: Electrolytes abnormality - evaluation and management  DETERDING,ELIZABETH 07/29/2013, 4:55 AM

## 2013-07-29 NOTE — Progress Notes (Signed)
2 Days Post-Op  Subjective: On vent   Objective: Vital signs in last 24 hours: Temp:  [98.4 F (36.9 C)-101.1 F (38.4 C)] 101.1 F (38.4 C) (02/22 0410) Pulse Rate:  [59-88] 75 (02/22 0815) Resp:  [13-32] 14 (02/22 0815) BP: (95-130)/(52-69) 125/65 mmHg (02/22 0800) SpO2:  [98 %-100 %] 98 % (02/22 0815) FiO2 (%):  [40 %] 40 % (02/22 0749) Weight:  [272 lb 0.8 oz (123.4 kg)] 272 lb 0.8 oz (123.4 kg) (02/22 0434) Last BM Date: 07/26/13  Intake/Output from previous day: 02/21 0701 - 02/22 0700 In: 3168.9 [I.V.:2366.6; NG/GT:59.3; IV Piggyback:743] Out: 2345 [Urine:2345] Intake/Output this shift: Total I/O In: 212 [I.V.:19; NG/GT:150; IV Piggyback:43] Out: 150 [Urine:150]  Incision/Wound:picture in chart, wound is pretty clean with some exudate at base  Lab Results:   Recent Labs  07/28/13 0442 07/29/13 0134  WBC 28.3* 25.4*  HGB 12.9* 11.1*  HCT 38.0* 32.9*  PLT 138* 100*   BMET  Recent Labs  07/28/13 1840 07/29/13 0134  NA 145 147  K 4.1 3.7  CL 112 113*  CO2 21 21  GLUCOSE 306* 352*  BUN 52* 50*  CREATININE 2.11* 1.97*  CALCIUM 8.1* 8.0*   PT/INR  Recent Labs  07/26/13 1528  LABPROT 16.3*  INR 1.34   ABG  Recent Labs  07/28/13 1729 07/29/13 0346  PHART 7.424 7.485*  HCO3 19.3* 21.3    Studies/Results: US Renal  07/28/2013   CLINICAL DATA:  Chronic renal insufficiency.  EXAM: RENAL/URINARY TRACT ULTRASOUND COMPLETE  COMPARISON:  CT scan dated 04/04/2009  FINDINGS: Right Kidney:  Length: 12.2 cm. Echogenicity within normal limits. No mass or hydronephrosis visualized.  Left Kidney:  Length: 12.7 cm. Echogenicity within normal limits. No mass or hydronephrosis visualized.  Bladder:  Foley catheter in place. The bladder wall appears slightly thickened.  IMPRESSION: Normal appearing kidneys.   Electronically Signed   By: Rozetta Nunnery M.D.   On: 07/28/2013 08:57   Dg Chest Port 1 View  07/29/2013   CLINICAL DATA:  Acute respiratory failure. On  ventilator.  EXAM: PORTABLE CHEST - 1 VIEW  COMPARISON:  07/28/2013  FINDINGS: Support lines and tubes in appropriate position. Both lungs remain clear. No evidence of pneumothorax. Heart size remains normal.  IMPRESSION: No active disease.   Electronically Signed   By: Earle Gell M.D.   On: 07/29/2013 07:53   Dg Chest Port 1 View  07/28/2013   CLINICAL DATA:  Intubation.  EXAM: PORTABLE CHEST - 1 VIEW  COMPARISON:  07/27/2013  FINDINGS: The tip of the endotracheal tube projects at the inferior margin of the clavicular heads, approximately 8 cm above the carina and not significantly changed. Right jugular central venous catheter remains in place with tip overlying the lower SVC. Enteric tube courses into the left upper abdomen with tip not imaged. Left-sided dual lead pacemaker is partially imaged. Cardiac silhouette appears upper limits of normal in size, unchanged. Lung volumes remain low. There is no evidence of airspace consolidation, edema, or definite pleural effusion. No pneumothorax is identified.  IMPRESSION: 1. Unchanged appearance of endotracheal tube and CVC. 2. Mildly low lung volumes without evidence of acute airspace disease.   Electronically Signed   By: Logan Bores   On: 07/28/2013 09:06   Dg Chest Port 1 View  07/27/2013   CLINICAL DATA:  Endotracheal tube placement.  EXAM: PORTABLE CHEST - 1 VIEW  COMPARISON:  Chest radiograph performed 07/26/2013  FINDINGS: The patient's endotracheal tube is seen ending  7 cm above the carina. An enteric tube is noted extending below the diaphragm, ending about the fundus, with the side port noted at the distal esophagus. The patient's right IJ line is seen ending about the cavoatrial junction.  The lungs are hypoexpanded but appear grossly clear. There is no evidence of focal opacification, pleural effusion or pneumothorax.  The cardiomediastinal silhouette is borderline normal in size. A pacemaker is seen overlying the left chest wall, with leads ending  overlying the right atrium and right ventricle. No acute osseous abnormalities are seen.  IMPRESSION: 1. Endotracheal tube seen ending 7 cm above the carina. This could be advanced 3-4 cm. 2. Enteric tube noted ending about the fundus, with the side port at the distal esophagus. This could be advanced approximately 4 cm. 3. Lungs hypoexpanded but grossly clear.   Electronically Signed   By: Garald Balding M.D.   On: 07/27/2013 23:18   Dg Abd Portable 1v  07/27/2013   CLINICAL DATA:  NG tube placement  EXAM: PORTABLE ABDOMEN - 1 VIEW  COMPARISON:  07/26/2013 chest x-ray  FINDINGS: NG tube tip is in the proximal stomach. Side port is at the gastroesophageal junction. Nonspecific gaseous distention of the transverse colon. Degenerative changes of the lower thoracic spine.  IMPRESSION: NG tube in the proximal stomach.   Electronically Signed   By: Daryll Brod M.D.   On: 07/27/2013 17:35    Anti-infectives: Anti-infectives   Start     Dose/Rate Route Frequency Ordered Stop   07/27/13 0000  vancomycin (VANCOCIN) IVPB 1000 mg/200 mL premix     1,000 mg 200 mL/hr over 60 Minutes Intravenous Every 12 hours 07/26/13 1329     07/26/13 2200  piperacillin-tazobactam (ZOSYN) IVPB 3.375 g     3.375 g 12.5 mL/hr over 240 Minutes Intravenous Every 8 hours 07/26/13 1329     07/26/13 1630  clindamycin (CLEOCIN) IVPB 600 mg     600 mg 100 mL/hr over 30 Minutes Intravenous 3 times per day 07/26/13 1556     07/26/13 1330  piperacillin-tazobactam (ZOSYN) IVPB 3.375 g     3.375 g 100 mL/hr over 30 Minutes Intravenous  Once 07/26/13 1316 07/26/13 1426   07/26/13 1330  vancomycin (VANCOCIN) IVPB 1000 mg/200 mL premix     1,000 mg 200 mL/hr over 60 Minutes Intravenous  Once 07/26/13 1316 07/26/13 1516   07/26/13 1330  vancomycin (VANCOCIN) 500 mg in sodium chloride 0.9 % 100 mL IVPB     500 mg 100 mL/hr over 60 Minutes Intravenous  Once 07/26/13 1329 07/26/13 1721      Assessment/Plan: POD 2 debridement of  back wound  I don't think there is need to go back to or although still having some fevers and white count up.  Will do bid dressing changes, can likely start hydro tomorrow and reassess for more debridement  [facility] 07/29/2013

## 2013-07-29 NOTE — Progress Notes (Signed)
SUBJECTIVE:  Intubated and sedated  OBJECTIVE:   Vitals:   Filed Vitals:   07/29/13 0749 07/29/13 0800 07/29/13 0815 07/29/13 0830  BP: 127/52 125/65    Pulse: 67 65 75 82  Temp:      TempSrc:      Resp: 24 13 14 14   Height:      Weight:      SpO2:  99% 98% 99%   I&O's:   Intake/Output Summary (Last 24 hours) at 07/29/13 1230 Last data filed at 07/29/13 0800  Gross per 24 hour  Intake 3160.43 ml  Output   1770 ml  Net 1390.43 ml   TELEMETRY: Reviewed telemetry pt in NSR:     PHYSICAL EXAM Lungs:   Clear bilaterally to auscultation anteriorly. Heart:   HRRR S1 S2 Pulses are 2+ & equal. Abdomen: Bowel sounds are positive, abdomen soft and non-tender without masses  LABS: Basic Metabolic Panel:  Recent Labs  07/28/13 1334  07/29/13 0134 07/29/13 1000  NA  --   < > 147 152*  K  --   < > 3.7 3.6*  CL  --   < > 113* 116*  CO2  --   < > 21 23  GLUCOSE  --   < > 352* 268*  BUN  --   < > 50* 49*  CREATININE  --   < > 1.97* 2.08*  CALCIUM  --   < > 8.0* 7.8*  MG 2.1  --  2.1  --   PHOS 2.8  --  1.7*  --   < > = values in this interval not displayed. Liver Function Tests:  Recent Labs  07/26/13 1316 07/29/13 0134  AST 18 17  ALT 19 14  ALKPHOS 170* 133*  BILITOT 0.3 0.5  PROT 7.3 5.7*  ALBUMIN 2.1* 1.5*   No results found for this basename: LIPASE, AMYLASE,  in the last 72 hours CBC:  Recent Labs  07/26/13 1603  07/28/13 0442 07/29/13 0134  WBC 23.3*  < > 28.3* 25.4*  NEUTROABS 20.5*  --   --   --   HGB 13.7  < > 12.9* 11.1*  HCT 41.0  < > 38.0* 32.9*  MCV 90.9  < > 88.8 88.7  PLT 228  < > 138* 100*  < > = values in this interval not displayed. Cardiac Enzymes:  Recent Labs  07/26/13 2013 07/27/13 0130  TROPONINI 7.62* 12.33*   BNP: No components found with this basename: POCBNP,  D-Dimer: No results found for this basename: DDIMER,  in the last 72 hours Hemoglobin A1C:  Recent Labs  07/28/13 0442  HGBA1C 11.7*   Fasting Lipid  Panel: No results found for this basename: CHOL, HDL, LDLCALC, TRIG, CHOLHDL, LDLDIRECT,  in the last 72 hours Thyroid Function Tests: No results found for this basename: TSH, T4TOTAL, FREET3, T3FREE, THYROIDAB,  in the last 72 hours Anemia Panel: No results found for this basename: VITAMINB12, FOLATE, FERRITIN, TIBC, IRON, RETICCTPCT,  in the last 72 hours Coag Panel:   Lab Results  Component Value Date   INR 1.27 07/29/2013   INR 1.34 07/26/2013   INR 0.97 02/11/2011    RADIOLOGY: US Renal  07/28/2013   CLINICAL DATA:  Chronic renal insufficiency.  EXAM: RENAL/URINARY TRACT ULTRASOUND COMPLETE  COMPARISON:  CT scan dated 04/04/2009  FINDINGS: Right Kidney:  Length: 12.2 cm. Echogenicity within normal limits. No mass or hydronephrosis visualized.  Left Kidney:  Length: 12.7 cm. Echogenicity within normal  limits. No mass or hydronephrosis visualized.  Bladder:  Foley catheter in place. The bladder wall appears slightly thickened.  IMPRESSION: Normal appearing kidneys.   Electronically Signed   By: Rozetta Nunnery M.D.   On: 07/28/2013 08:57   Dg Chest Port 1 View  07/29/2013   CLINICAL DATA:  Acute respiratory failure. On ventilator.  EXAM: PORTABLE CHEST - 1 VIEW  COMPARISON:  07/28/2013  FINDINGS: Support lines and tubes in appropriate position. Both lungs remain clear. No evidence of pneumothorax. Heart size remains normal.  IMPRESSION: No active disease.   Electronically Signed   By: Earle Gell M.D.   On: 07/29/2013 07:53   Dg Chest Port 1 View  07/28/2013   CLINICAL DATA:  Intubation.  EXAM: PORTABLE CHEST - 1 VIEW  COMPARISON:  07/27/2013  FINDINGS: The tip of the endotracheal tube projects at the inferior margin of the clavicular heads, approximately 8 cm above the carina and not significantly changed. Right jugular central venous catheter remains in place with tip overlying the lower SVC. Enteric tube courses into the left upper abdomen with tip not imaged. Left-sided dual lead pacemaker is  partially imaged. Cardiac silhouette appears upper limits of normal in size, unchanged. Lung volumes remain low. There is no evidence of airspace consolidation, edema, or definite pleural effusion. No pneumothorax is identified.  IMPRESSION: 1. Unchanged appearance of endotracheal tube and CVC. 2. Mildly low lung volumes without evidence of acute airspace disease.   Electronically Signed   By: Logan Bores   On: 07/28/2013 09:06   Dg Chest Port 1 View  07/27/2013   CLINICAL DATA:  Endotracheal tube placement.  EXAM: PORTABLE CHEST - 1 VIEW  COMPARISON:  Chest radiograph performed 07/26/2013  FINDINGS: The patient's endotracheal tube is seen ending 7 cm above the carina. An enteric tube is noted extending below the diaphragm, ending about the fundus, with the side port noted at the distal esophagus. The patient's right IJ line is seen ending about the cavoatrial junction.  The lungs are hypoexpanded but appear grossly clear. There is no evidence of focal opacification, pleural effusion or pneumothorax.  The cardiomediastinal silhouette is borderline normal in size. A pacemaker is seen overlying the left chest wall, with leads ending overlying the right atrium and right ventricle. No acute osseous abnormalities are seen.  IMPRESSION: 1. Endotracheal tube seen ending 7 cm above the carina. This could be advanced 3-4 cm. 2. Enteric tube noted ending about the fundus, with the side port at the distal esophagus. This could be advanced approximately 4 cm. 3. Lungs hypoexpanded but grossly clear.   Electronically Signed   By: Garald Balding M.D.   On: 07/27/2013 23:18   Dg Chest Port 1 View  07/26/2013   CLINICAL DATA:  Check  central line placement  EXAM: PORTABLE CHEST - 1 VIEW  COMPARISON:  07/26/2013 1326 hrs  FINDINGS: Cardiac shadow is stable. A new right jugular central line is noted with the tip at the cavoatrial junction. The lungs are clear bilaterally. No pneumothorax is seen. Bilateral subcutaneous  emphysema is noted. A pacing device is again seen. These changes are stable from the prior exam.  IMPRESSION: Status post central line placement without pneumothorax.  Generalized subcutaneous emphysema is again noted particularly on the left side.   Electronically Signed   By: Inez Catalina M.D.   On: 07/26/2013 16:08   Dg Chest Port 1 View  07/26/2013   CLINICAL DATA:  Fall, confusion and abscess of back.  EXAM: PORTABLE CHEST - 1 VIEW  COMPARISON:  DG CHEST 2 VIEW dated 06/12/2012; DG CHEST 2 VIEW dated 08/20/2011  FINDINGS: There is some subcutaneous air at the base of the left neck and extending along the lateral left chest wall. A definite pneumothorax is not identified by portable chest x-ray. Given clinical history, this may represent a necrotizing process with extensive subcutaneous air present. CT evaluation of the neck and chest with contrast would be helpful.  No displaced rib fractures are seen. The heart size and mediastinal contours are stable. There is stable appearance of the dual chamber pacemaker.  IMPRESSION: Subcutaneous air in the left neck and left chest wall. A definite pneumothorax or pneumomediastinum is not visualized and this may represent a necrotizing infectious process given the history of abscess on the back. Recommend correlation with CT of the neck and chest with contrast.  Critical Value/emergent results were called by telephone at the time of interpretation on 07/26/2013 at 2:08 PM to [facility], PA-C, who verbally acknowledged these results.   Electronically Signed   By: Aletta Edouard M.D.   On: 07/26/2013 14:08   Dg Abd Portable 1v  07/27/2013   CLINICAL DATA:  NG tube placement  EXAM: PORTABLE ABDOMEN - 1 VIEW  COMPARISON:  07/26/2013 chest x-ray  FINDINGS: NG tube tip is in the proximal stomach. Side port is at the gastroesophageal junction. Nonspecific gaseous distention of the transverse colon. Degenerative changes of the lower thoracic spine.  IMPRESSION: NG tube in the  proximal stomach.   Electronically Signed   By: Daryll Brod M.D.   On: 07/27/2013 17:35    ASSESSMENT:  1. NSTEMI type II, presumed related to shock in setting of hypertrophic CM although troponin has gotten pretty high at 12 and is still trending upwards.  Will get 2D echo today to assess RWMA's 2. Necrotizing fasciitis  3. CKD with acute superimposed injury   Plan:  1. Continue aggressive support  2. Check echo to assess LV and valves 3.  Continue to cycle enzymes until they peak     Sueanne Margarita, MD  07/29/2013  12:30 PM

## 2013-07-29 NOTE — Progress Notes (Signed)
RT in another room checking ventilator and receives a call stating pt had self extubated himself. RT went to check on patient. Vital signs stable at this time. Pt placed on nasal cannula with 02 sats of 98%. Pt able to speak. RT will continue to monitor.

## 2013-07-29 NOTE — Progress Notes (Signed)
Name: [name] MRN: [id number] DOB: 02-23-1961    ADMISSION DATE:  07/26/2013 CONSULTATION DATE:  07/26/2013  REFERRING MD :  Jeneen Rinks PRIMARY SERVICE: PCCM  CHIEF COMPLAINT:  Necrotizing fasciitis  BRIEF PATIENT DESCRIPTION: 53 y/o M admitted to [facility] with findings of necrotizing fasciitis of L neck.   SIGNIFICANT EVENTS / STUDIES:  2/19 - Admit from surgical center after a fall in parking lot, CXR c/w sub-q air concerning for nec fasc 2/20 OR for debridement of right back abscess 2/21- remains in shock 2/21 renal US normal 2/22 - remains in shock on pressors, febrile  LINES / TUBES: R IJ TLC 2/19>>> ETT 2/20 >>> L brachial Art line 2/19 >>>  CULTURES: BCx2 2/19>>>  UC 2/19>>> neg Wound 2/19>>>abundant GPC in pairs Abscess cx 2/20 >>>abundant GPC in pairs Anaerobic cx 2/20 >>>abundant GPC in pairs  ANTIBIOTICS: Vanco 2/19>>>  Zosyn 2/19>>>  Clinda 2/19>>>  SUBJECTIVE: intubated, on pressors, febrile overnight  VITAL SIGNS: Temp:  [98.4 F (36.9 C)-101.1 F (38.4 C)] 101.1 F (38.4 C) (02/22 0410) Pulse Rate:  [59-88] 74 (02/22 0343) Resp:  [13-32] 32 (02/22 0343) BP: (95-130)/(52-69) 130/56 mmHg (02/22 0343) SpO2:  [98 %-100 %] 100 % (02/22 0343) FiO2 (%):  [40 %] 40 % (02/22 0343) Weight:  [123.4 kg (272 lb 0.8 oz)] 123.4 kg (272 lb 0.8 oz) (02/22 0434) HEMODYNAMICS: CVP:  [8 mmHg] 8 mmHg VENTILATOR SETTINGS: Vent Mode:  [-] PRVC FiO2 (%):  [40 %] 40 % Set Rate:  [14 bmp-32 bmp] 32 bmp Vt Set:  [550 mL] 550 mL PEEP:  [5 cmH20] 5 cmH20 Plateau Pressure:  [18 cmH20-24 cmH20] 22 cmH20 INTAKE / OUTPUT: Intake/Output     02/21 0701 - 02/22 0700   I.V. (mL/kg) 1859.7 (15.1)   NG/GT 59.3   IV Piggyback 500   Total Intake(mL/kg) 2419 (19.6)   Urine (mL/kg/hr) 2345 (0.8)   Total Output 2345   Net +74         PHYSICAL EXAMINATION: General: Morbidly obese male lying in bed Neuro: sedated, non-responsive to voice HEENT: ETT in  placed Cardiovascular: rrr, no m/r/g  Lungs: coarse, no wheezes or crackles  Abdomen: Round/soft, bs +  Musculoskeletal: No acute deformities, 2+ edema BLE  Skin: Warm/dry Wound dressed, leaking   LABS:  CBC  Recent Labs Lab 07/27/13 0420 07/27/13 1307 07/28/13 0442 07/29/13 0134  WBC 24.9*  --  28.3* 25.4*  HGB 13.5 13.6 12.9* 11.1*  HCT 38.8* 40.0 38.0* 32.9*  PLT 208  --  138* 100*   Coag's  Recent Labs Lab 07/26/13 1528  APTT 25  INR 1.34   BMET  Recent Labs Lab 07/28/13 0800 07/28/13 1840 07/29/13 0134  NA 147 145 147  K 4.6 4.1 3.7  CL 112 112 113*  CO2 _0 BUN 55* 52* 50*  CREATININE 2.26* 2.11* 1.97*  GLUCOSE 330* 306* 352*   Electrolytes  Recent Labs Lab 07/27/13 0420  07/28/13 0800 07/28/13 1334 07/28/13 1840 07/29/13 0134  CALCIUM 8.8  < > 8.2*  --  8.1* 8.0*  MG 2.6*  --   --  2.1  --  2.1  PHOS 2.3  --   --  2.8  --  1.7*  < > = values in this interval not displayed. Sepsis Markers  Recent Labs Lab 07/26/13 1417 07/26/13 1529  LATICACIDVEN 7.25* 4.9*   ABG  Recent Labs Lab 07/28/13 1015 07/28/13 1729 07/29/13 0346  PHART 7.278* 7.424  7.485*  PCO2ART 38.0 29.4* 28.6*  PO2ART 118.0* 128.0* 136.0*   Liver Enzymes  Recent Labs Lab 07/26/13 1316 07/29/13 0134  AST 18 17  ALT 19 14  ALKPHOS 170* 133*  BILITOT 0.3 0.5  ALBUMIN 2.1* 1.5*   Cardiac Enzymes  Recent Labs Lab 07/26/13 2013 07/27/13 0130  TROPONINI 7.62* 12.33*   Glucose  Recent Labs Lab 07/28/13 0716 07/28/13 1115 07/28/13 1513 07/28/13 1945 07/28/13 2358 07/29/13 0342  GLUCAP 275* 254* 297* 262* 279* 303*    Imaging US Renal  07/28/2013   CLINICAL DATA:  Chronic renal insufficiency.  EXAM: RENAL/URINARY TRACT ULTRASOUND COMPLETE  COMPARISON:  CT scan dated 04/04/2009  FINDINGS: Right Kidney:  Length: 12.2 cm. Echogenicity within normal limits. No mass or hydronephrosis visualized.  Left Kidney:  Length: 12.7 cm. Echogenicity  within normal limits. No mass or hydronephrosis visualized.  Bladder:  Foley catheter in place. The bladder wall appears slightly thickened.  IMPRESSION: Normal appearing kidneys.   Electronically Signed   By: Rozetta Nunnery M.D.   On: 07/28/2013 08:57   Dg Chest Port 1 View  07/28/2013   CLINICAL DATA:  Intubation.  EXAM: PORTABLE CHEST - 1 VIEW  COMPARISON:  07/27/2013  FINDINGS: The tip of the endotracheal tube projects at the inferior margin of the clavicular heads, approximately 8 cm above the carina and not significantly changed. Right jugular central venous catheter remains in place with tip overlying the lower SVC. Enteric tube courses into the left upper abdomen with tip not imaged. Left-sided dual lead pacemaker is partially imaged. Cardiac silhouette appears upper limits of normal in size, unchanged. Lung volumes remain low. There is no evidence of airspace consolidation, edema, or definite pleural effusion. No pneumothorax is identified.  IMPRESSION: 1. Unchanged appearance of endotracheal tube and CVC. 2. Mildly low lung volumes without evidence of acute airspace disease.   Electronically Signed   By: Logan Bores   On: 07/28/2013 09:06   Dg Chest Port 1 View  07/27/2013   CLINICAL DATA:  Endotracheal tube placement.  EXAM: PORTABLE CHEST - 1 VIEW  COMPARISON:  Chest radiograph performed 07/26/2013  FINDINGS: The patient's endotracheal tube is seen ending 7 cm above the carina. An enteric tube is noted extending below the diaphragm, ending about the fundus, with the side port noted at the distal esophagus. The patient's right IJ line is seen ending about the cavoatrial junction.  The lungs are hypoexpanded but appear grossly clear. There is no evidence of focal opacification, pleural effusion or pneumothorax.  The cardiomediastinal silhouette is borderline normal in size. A pacemaker is seen overlying the left chest wall, with leads ending overlying the right atrium and right ventricle. No acute  osseous abnormalities are seen.  IMPRESSION: 1. Endotracheal tube seen ending 7 cm above the carina. This could be advanced 3-4 cm. 2. Enteric tube noted ending about the fundus, with the side port at the distal esophagus. This could be advanced approximately 4 cm. 3. Lungs hypoexpanded but grossly clear.   Electronically Signed   By: Garald Balding M.D.   On: 07/27/2013 23:18   Dg Abd Portable 1v  07/27/2013   CLINICAL DATA:  NG tube placement  EXAM: PORTABLE ABDOMEN - 1 VIEW  COMPARISON:  07/26/2013 chest x-ray  FINDINGS: NG tube tip is in the proximal stomach. Side port is at the gastroesophageal junction. Nonspecific gaseous distention of the transverse colon. Degenerative changes of the lower thoracic spine.  IMPRESSION: NG tube in the proximal stomach.  Electronically Signed   By: Daryll Brod M.D.   On: 07/27/2013 17:35   CXR under penetrated, ETT ~5.5 cm above carina  ASSESSMENT / PLAN:  PULMONARY  A:  Intubated, Acute resp failure noted pre surgery P:  -vent FiO2 40% RR 30 Tv 550 Peep 5, abg reviewed, decrease rate to 24 given pH -after correct of mild alk, then cpap5 ps 5, x 2 hrs, no extubation planned -advance ETT 2 cm  -am pcxr -am abg as well -SBT to attempt wioth reduced pressors overall  CARDIOVASCULAR  A:  Septic Shock 7/16 Diastolic CHF / Hypertrophic Cardiomyopathy  HTN  Mobitz II  Hx Lymphedema LE's  Cortisol 53.8 Elevated lactic acid Elevated troponin No adrenal insuff P:  -monitor CVP -TTE today, low, bolus further -wean pressors as able as we bolus again today, insensible losses from such a large wound likely -appreciate cards consult - echo to be done -no asa until done with OR -see ID -levophed and vasopressin to continue - wean as tolerated  RENAL  A:  Lactic Acidosis improved Fabry's Disease  Aki on Chronic Renal Insufficiency - improved  Hypokalemia Hypophosphatemia Non AG hypernatremia P:  -daily bmet -renal US normal -hold bicarb now  that alkalotic  -phos repleted by elink -add free water -change fluids to 1/2 NS 125  GASTROINTESTINAL  A:  Morbid Obesity  P:  -ppi -continue tube feeds - will need to hold if going to OR -LFTs wnl  HEMATOLOGIC  A:  DVT Proph  Anemia - ?dilutional vs surgical Thrombocytopenia (sepsis, dilution) P:  -SCD's  -hold sub q heparin with dropping platelets with pos balance, follow trend further, too early HITT -repeat coags   INFECTIOUS  A:  Necrotizing Fasciitis, fat, soft tissue  LU Posterior Abscess  Stain likely strep P:  -CCS consult for surgical debridement - appreciate their help, maybe to OR today pending re-eval -abx as above  -follow cultures  -clinda for toxin inhibition until off pressors -hold IVG Tx with just stain  ENDOCRINE  A:  DM hyperglycemia Gout  P:  -lantus SSI - increase lantus today -monitor cbgs  NEUROLOGIC  A:  Pain, vent dysycrony P:  -fentanyl -rass 0  Tommi Rumps, MD [place] PGY-2  TODAY'S SUMMARY: continue pressors, re-eval by surgery today for or, increase lantus, continue antibiotics, SBT , bolus   I have personally obtained a history, examined the patient, evaluated laboratory and imaging results, formulated the assessment and plan and placed orders. CRITICAL CARE: The patient is critically ill with multiple organ systems failure and requires high complexity decision making for assessment and support, frequent evaluation and titration of therapies, application of advanced monitoring technologies and extensive interpretation of multiple databases. Critical Care Time devoted to patient care services described in this note is 30 minutes.   Lavon Paganini. Titus Mould, MD, FACP Pgr: [facility]  Pulmonary and [place] Pager: [phone]  07/29/2013, 6:17 AM

## 2013-07-30 ENCOUNTER — Encounter (HOSPITAL_COMMUNITY): Payer: Self-pay | Admitting: General Surgery

## 2013-07-30 ENCOUNTER — Inpatient Hospital Stay (HOSPITAL_COMMUNITY): Payer: Medicare Other

## 2013-07-30 DIAGNOSIS — J449 Chronic obstructive pulmonary disease, unspecified: Secondary | ICD-10-CM | POA: Diagnosis present

## 2013-07-30 DIAGNOSIS — D649 Anemia, unspecified: Secondary | ICD-10-CM | POA: Diagnosis present

## 2013-07-30 DIAGNOSIS — D696 Thrombocytopenia, unspecified: Secondary | ICD-10-CM | POA: Diagnosis not present

## 2013-07-30 DIAGNOSIS — I214 Non-ST elevation (NSTEMI) myocardial infarction: Secondary | ICD-10-CM | POA: Diagnosis not present

## 2013-07-30 DIAGNOSIS — E871 Hypo-osmolality and hyponatremia: Secondary | ICD-10-CM | POA: Diagnosis not present

## 2013-07-30 DIAGNOSIS — G9340 Encephalopathy, unspecified: Secondary | ICD-10-CM | POA: Diagnosis present

## 2013-07-30 DIAGNOSIS — M726 Necrotizing fasciitis: Secondary | ICD-10-CM | POA: Diagnosis not present

## 2013-07-30 DIAGNOSIS — N289 Disorder of kidney and ureter, unspecified: Secondary | ICD-10-CM | POA: Diagnosis not present

## 2013-07-30 DIAGNOSIS — F17200 Nicotine dependence, unspecified, uncomplicated: Secondary | ICD-10-CM | POA: Diagnosis not present

## 2013-07-30 DIAGNOSIS — I5030 Unspecified diastolic (congestive) heart failure: Secondary | ICD-10-CM | POA: Diagnosis not present

## 2013-07-30 DIAGNOSIS — W19XXXA Unspecified fall, initial encounter: Secondary | ICD-10-CM | POA: Diagnosis present

## 2013-07-30 DIAGNOSIS — M109 Gout, unspecified: Secondary | ICD-10-CM | POA: Diagnosis present

## 2013-07-30 DIAGNOSIS — J9600 Acute respiratory failure, unspecified whether with hypoxia or hypercapnia: Secondary | ICD-10-CM | POA: Diagnosis not present

## 2013-07-30 DIAGNOSIS — E1101 Type 2 diabetes mellitus with hyperosmolarity with coma: Secondary | ICD-10-CM

## 2013-07-30 DIAGNOSIS — E872 Acidosis, unspecified: Secondary | ICD-10-CM | POA: Diagnosis not present

## 2013-07-30 DIAGNOSIS — A419 Sepsis, unspecified organism: Secondary | ICD-10-CM | POA: Diagnosis not present

## 2013-07-30 DIAGNOSIS — I428 Other cardiomyopathies: Secondary | ICD-10-CM | POA: Diagnosis not present

## 2013-07-30 DIAGNOSIS — Z91013 Allergy to seafood: Secondary | ICD-10-CM | POA: Diagnosis not present

## 2013-07-30 DIAGNOSIS — I509 Heart failure, unspecified: Secondary | ICD-10-CM | POA: Diagnosis not present

## 2013-07-30 DIAGNOSIS — N186 End stage renal disease: Secondary | ICD-10-CM | POA: Diagnosis not present

## 2013-07-30 DIAGNOSIS — E1165 Type 2 diabetes mellitus with hyperglycemia: Secondary | ICD-10-CM | POA: Diagnosis not present

## 2013-07-30 DIAGNOSIS — Z7982 Long term (current) use of aspirin: Secondary | ICD-10-CM | POA: Diagnosis not present

## 2013-07-30 DIAGNOSIS — T07XXXA Unspecified multiple injuries, initial encounter: Secondary | ICD-10-CM | POA: Diagnosis not present

## 2013-07-30 DIAGNOSIS — I441 Atrioventricular block, second degree: Secondary | ICD-10-CM | POA: Diagnosis present

## 2013-07-30 DIAGNOSIS — E870 Hyperosmolality and hypernatremia: Secondary | ICD-10-CM | POA: Diagnosis not present

## 2013-07-30 DIAGNOSIS — N189 Chronic kidney disease, unspecified: Secondary | ICD-10-CM | POA: Diagnosis not present

## 2013-07-30 DIAGNOSIS — I5032 Chronic diastolic (congestive) heart failure: Secondary | ICD-10-CM | POA: Diagnosis not present

## 2013-07-30 DIAGNOSIS — Z950 Presence of cardiac pacemaker: Secondary | ICD-10-CM | POA: Diagnosis not present

## 2013-07-30 DIAGNOSIS — E139 Other specified diabetes mellitus without complications: Secondary | ICD-10-CM | POA: Diagnosis not present

## 2013-07-30 DIAGNOSIS — I452 Bifascicular block: Secondary | ICD-10-CM | POA: Diagnosis present

## 2013-07-30 DIAGNOSIS — R799 Abnormal finding of blood chemistry, unspecified: Secondary | ICD-10-CM | POA: Diagnosis not present

## 2013-07-30 DIAGNOSIS — I129 Hypertensive chronic kidney disease with stage 1 through stage 4 chronic kidney disease, or unspecified chronic kidney disease: Secondary | ICD-10-CM | POA: Diagnosis not present

## 2013-07-30 DIAGNOSIS — E1010 Type 1 diabetes mellitus with ketoacidosis without coma: Secondary | ICD-10-CM | POA: Diagnosis not present

## 2013-07-30 DIAGNOSIS — R000 Tachycardia, unspecified: Secondary | ICD-10-CM | POA: Diagnosis present

## 2013-07-30 DIAGNOSIS — R5381 Other malaise: Secondary | ICD-10-CM | POA: Diagnosis not present

## 2013-07-30 DIAGNOSIS — I890 Lymphedema, not elsewhere classified: Secondary | ICD-10-CM | POA: Diagnosis not present

## 2013-07-30 DIAGNOSIS — E875 Hyperkalemia: Secondary | ICD-10-CM | POA: Diagnosis present

## 2013-07-30 DIAGNOSIS — L723 Sebaceous cyst: Secondary | ICD-10-CM | POA: Diagnosis not present

## 2013-07-30 DIAGNOSIS — L02219 Cutaneous abscess of trunk, unspecified: Secondary | ICD-10-CM | POA: Diagnosis not present

## 2013-07-30 DIAGNOSIS — J45909 Unspecified asthma, uncomplicated: Secondary | ICD-10-CM | POA: Diagnosis present

## 2013-07-30 DIAGNOSIS — R6520 Severe sepsis without septic shock: Secondary | ICD-10-CM | POA: Diagnosis not present

## 2013-07-30 DIAGNOSIS — G4730 Sleep apnea, unspecified: Secondary | ICD-10-CM | POA: Diagnosis present

## 2013-07-30 DIAGNOSIS — Z79899 Other long term (current) drug therapy: Secondary | ICD-10-CM | POA: Diagnosis not present

## 2013-07-30 DIAGNOSIS — I120 Hypertensive chronic kidney disease with stage 5 chronic kidney disease or end stage renal disease: Secondary | ICD-10-CM | POA: Diagnosis not present

## 2013-07-30 DIAGNOSIS — R0682 Tachypnea, not elsewhere classified: Secondary | ICD-10-CM | POA: Diagnosis present

## 2013-07-30 DIAGNOSIS — E756 Lipid storage disorder, unspecified: Secondary | ICD-10-CM | POA: Diagnosis not present

## 2013-07-30 DIAGNOSIS — E876 Hypokalemia: Secondary | ICD-10-CM | POA: Diagnosis present

## 2013-07-30 DIAGNOSIS — IMO0002 Reserved for concepts with insufficient information to code with codable children: Secondary | ICD-10-CM | POA: Diagnosis not present

## 2013-07-30 DIAGNOSIS — I422 Other hypertrophic cardiomyopathy: Secondary | ICD-10-CM | POA: Diagnosis present

## 2013-07-30 DIAGNOSIS — N179 Acute kidney failure, unspecified: Secondary | ICD-10-CM | POA: Diagnosis not present

## 2013-07-30 DIAGNOSIS — R6521 Severe sepsis with septic shock: Secondary | ICD-10-CM | POA: Diagnosis present

## 2013-07-30 DIAGNOSIS — J4489 Other specified chronic obstructive pulmonary disease: Secondary | ICD-10-CM | POA: Diagnosis not present

## 2013-07-30 DIAGNOSIS — I4891 Unspecified atrial fibrillation: Secondary | ICD-10-CM | POA: Diagnosis not present

## 2013-07-30 DIAGNOSIS — Z6841 Body Mass Index (BMI) 40.0 and over, adult: Secondary | ICD-10-CM | POA: Diagnosis not present

## 2013-07-30 LAB — PROTIME-INR
INR: 1.17 (ref 0.00–1.49)
Prothrombin Time: 14.7 seconds (ref 11.6–15.2)

## 2013-07-30 LAB — BASIC METABOLIC PANEL
BUN: 42 mg/dL — ABNORMAL HIGH (ref 6–23)
CO2: 24 mEq/L (ref 19–32)
Calcium: 7.9 mg/dL — ABNORMAL LOW (ref 8.4–10.5)
Chloride: 116 mEq/L — ABNORMAL HIGH (ref 96–112)
Creatinine, Ser: 1.91 mg/dL — ABNORMAL HIGH (ref 0.50–1.35)
GFR calc Af Amer: 45 mL/min — ABNORMAL LOW (ref >90)
GFR calc non Af Amer: 39 mL/min — ABNORMAL LOW (ref >90)
Glucose, Bld: 199 mg/dL — ABNORMAL HIGH (ref 70–99)
Potassium: 4 mEq/L (ref 3.7–5.3)
Sodium: 151 mEq/L — ABNORMAL HIGH (ref 137–147)

## 2013-07-30 LAB — POCT I-STAT 3, ART BLOOD GAS (G3+)
Acid-base deficit: 2 mmol/L (ref 0.0–2.0)
Bicarbonate: 24 mEq/L (ref 20.0–24.0)
O2 Saturation: 98 %
Patient temperature: 98.4
TCO2: 25 mmol/L (ref 0–100)
pCO2 arterial: 43.1 mmHg (ref 35.0–45.0)
pH, Arterial: 7.353 (ref 7.350–7.450)
pO2, Arterial: 120 mmHg — ABNORMAL HIGH (ref 80.0–100.0)

## 2013-07-30 LAB — TROPONIN I: Troponin I: 0.88 ng/mL (ref <0.30)

## 2013-07-30 LAB — GLUCOSE, CAPILLARY
Glucose-Capillary: 156 mg/dL — ABNORMAL HIGH (ref 70–99)
Glucose-Capillary: 158 mg/dL — ABNORMAL HIGH (ref 70–99)
Glucose-Capillary: 169 mg/dL — ABNORMAL HIGH (ref 70–99)
Glucose-Capillary: 203 mg/dL — ABNORMAL HIGH (ref 70–99)
Glucose-Capillary: 215 mg/dL — ABNORMAL HIGH (ref 70–99)
Glucose-Capillary: 267 mg/dL — ABNORMAL HIGH (ref 70–99)

## 2013-07-30 LAB — CBC
HCT: 32 % — ABNORMAL LOW (ref 39.0–52.0)
Hemoglobin: 10.3 g/dL — ABNORMAL LOW (ref 13.0–17.0)
MCH: 29.5 pg (ref 26.0–34.0)
MCHC: 32.2 g/dL (ref 30.0–36.0)
MCV: 91.7 fL (ref 78.0–100.0)
Platelets: 71 10*3/uL — ABNORMAL LOW (ref 150–400)
RBC: 3.49 MIL/uL — ABNORMAL LOW (ref 4.22–5.81)
RDW: 14.9 % (ref 11.5–15.5)
WBC: 25.8 10*3/uL — ABNORMAL HIGH (ref 4.0–10.5)

## 2013-07-30 LAB — VANCOMYCIN, TROUGH: Vancomycin Tr: 27.6 ug/mL (ref 10.0–20.0)

## 2013-07-30 LAB — CULTURE, ROUTINE-ABSCESS: Gram Stain: NONE SEEN

## 2013-07-30 LAB — PHOSPHORUS: Phosphorus: 4.4 mg/dL (ref 2.3–4.6)

## 2013-07-30 LAB — MAGNESIUM: Magnesium: 1.9 mg/dL (ref 1.5–2.5)

## 2013-07-30 MED ORDER — INSULIN GLARGINE 100 UNIT/ML ~~LOC~~ SOLN: 30.0000 [IU] | Freq: Every day | SUBCUTANEOUS | Status: DC

## 2013-07-30 MED ORDER — INSULIN ASPART 100 UNIT/ML ~~LOC~~ SOLN: 0.0000 [IU] | SUBCUTANEOUS | Status: DC

## 2013-07-30 MED ORDER — INSULIN GLARGINE 100 UNIT/ML ~~LOC~~ SOLN: 15.0000 [IU] | Freq: Every day | SUBCUTANEOUS | Status: DC

## 2013-07-30 MED ORDER — ASPIRIN EC 81 MG PO TBEC
81.0000 mg | DELAYED_RELEASE_TABLET | Freq: Every day | ORAL | Status: DC
  Administered 2013-07-30: 81 mg via ORAL
  Filled 2013-07-30: qty 1

## 2013-07-30 MED ORDER — PIPERACILLIN-TAZOBACTAM 3.375 G IVPB: 3.3750 g | Freq: Three times a day (TID) | INTRAVENOUS | Status: DC

## 2013-07-30 NOTE — Progress Notes (Signed)
CRITICAL VALUE ALERT  Critical value received:  Troponin 0.88  Date of notification:  07/30/2013  Time of notification:  [id number]  Critical value read back:yes  Nurse who received alert:  Elliot Gurney, RN  MD notified (1st page):  Dr. Elsworth Soho  Time of first page:  [phone]  MD notified (2nd page):  Time of second page:  Responding MD:  Dr. Elsworth Soho  Time MD responded:  [phone]

## 2013-07-30 NOTE — Progress Notes (Signed)
Inpatient Diabetes Program Recommendations  AACE/ADA: New Consensus Statement on Inpatient Glycemic Control (2013)  Target Ranges:  Prepandial:   less than 140 mg/dL      Peak postprandial:   less than 180 mg/dL (1-2 hours)      Critically ill patients:  140 - 180 mg/dL   Reason for Visit: Results for RAJEEV, WALLETT (MRN [id number]) as of 07/30/2013 09:51  Ref. Range 07/29/2013 16:07 07/29/2013 19:40 07/29/2013 23:54 07/30/2013 03:52 07/30/2013 08:04  Glucose-Capillary Latest Range: 70-99 mg/dL 141 (H) 162 (H) 203 (H) 215 (H) 158 (H)   Diabetes history: Type 2 diabetes.  Note A1C=11.7%. Outpatient Diabetes medications: Glyburide 5 mg daily. Current orders for Inpatient glycemic control: Lantus 15 units daily, Moderate q 4 hours.  Note that patient NPO so Lantus decreased to 15 units daily.  Based on patient's weight, consider increasing Lantus to 25 units daily (0.2 units/kg).  Once patient is eating, may consider adding Novolog meal coverage 6 units tid with meals (hold if patient eats less than 50%).  Patient will likely need insulin at home after discharge.  Will follow. Adah Perl, RN, BC-ADM Inpatient Diabetes Coordinator Pager [phone]

## 2013-07-30 NOTE — Progress Notes (Signed)
UTA to finish care plan and Admission history, because patient is confused

## 2013-07-30 NOTE — Progress Notes (Addendum)
SUBJECTIVE:  Extubated, answers questions appropriately.  OBJECTIVE:   Vitals:   Filed Vitals:   07/30/13 1200 07/30/13 1217 07/30/13 1300 07/30/13 1400  BP: 113/63  123/65 103/61  Pulse: 64  73 65  Temp:  97.8 F (36.6 C)    TempSrc:  Oral    Resp: 24  24 17   Height:      Weight:      SpO2: 95%  96% 98%   I&O's:    Intake/Output Summary (Last 24 hours) at 07/30/13 1408 Last data filed at 07/30/13 1400  Gross per 24 hour  Intake 3187.5 ml  Output   2750 ml  Net  437.5 ml   TELEMETRY: Reviewed telemetry pt in NSR:  PHYSICAL EXAM Lungs:   Clear bilaterally to auscultation anteriorly. Heart:   HRRR S1 S2 Pulses are 2+ & equal. Abdomen: Bowel sounds are positive, abdomen soft and non-tender without masses  LABS: Basic Metabolic Panel:  Recent Labs  07/29/13 1334 07/29/13 1800 07/30/13 0400 07/30/13 0751  NA  --  150*  --  151*  K  --  3.8  --  4.0  CL  --  113*  --  116*  CO2  --  22  --  24  GLUCOSE  --  210*  --  199*  BUN  --  46*  --  42*  CREATININE  --  1.99*  --  1.91*  CALCIUM  --  7.9*  --  7.9*  MG 2.0  --  1.9  --   PHOS 4.4  --  4.4  --    Liver Function Tests:  Recent Labs  07/29/13 0134  AST 17  ALT 14  ALKPHOS 133*  BILITOT 0.5  PROT 5.7*  ALBUMIN 1.5*   CBC:  Recent Labs  07/29/13 0134 07/30/13 0400  WBC 25.4* 25.8*  HGB 11.1* 10.3*  HCT 32.9* 32.0*  MCV 88.7 91.7  PLT 100* 71*    Recent Labs  07/28/13 0442  HGBA1C 11.7*   Coag Panel:   Lab Results  Component Value Date   INR 1.17 07/30/2013   INR 1.27 07/29/2013   INR 1.34 07/26/2013    RADIOLOGY:  Dg Chest Port 1 View  07/29/2013   CLINICAL DATA:  Acute respiratory failure. On ventilator.  EXAM: PORTABLE CHEST - 1 VIEW  COMPARISON:  07/28/2013  FINDINGS: Support lines and tubes in appropriate position. Both lungs remain clear. No evidence of pneumothorax. Heart size remains normal.  IMPRESSION: No active disease.     ECHO 07/29/2013 - Left ventricle: The  cavity size was normal. Wall thickness was increased in a pattern of moderate LVH. The appearance of the myocardium is concerning for the possibility of infiltrative disease. Narrowed LV outflow tract with turbulence noted. Peak gradient across LVOT about 22 mmHg. No systolic anterior motion. Systolic function was normal. The estimated ejection fraction was in the range of 60% to 65%. Wall motion was normal; there were no regional wall motion abnormalities. Doppler parameters are consistent with abnormal left ventricular relaxation (grade 1 diastolic dysfunction). - Aortic valve: Sclerosis without stenosis. - Mitral valve: Mildly calcified annulus. Mildly calcified leaflets . No significant regurgitation. - Left atrium: The atrium was mildly dilated. - Right ventricle: The cavity size was normal. Systolic function was normal. - Tricuspid valve: Peak RV-RA gradient: 36mm Hg (S). - Pulmonary arteries: PA peak pressure: 28mm Hg (S). - Inferior vena cava: The vessel was normal in size; the respirophasic diameter changes were  in the normal range (= 50%); findings are consistent with normal central venous pressure.   ASSESSMENT:   1. NSTEMI type II, presumed related to septic shock in setting of hypertrophic CM although troponin has gotten pretty high at 12 on 07/27/13, not followed afterwards, we will order today to see the trend.  Will get 2D echo showed preserved LVEF and no WMA, moderate LVH and ? Infiltrative disease. Abnormal relaxation  2. Necrotizing fasciitis   3. CKD with acute superimposed injury   Plan:  1. Continue aggressive support  2. Check echo to assess LV and valves 3. Continue to cycle enzymes until they peak, not a candidate for a cath until he clears the infection.   Ena Dawley, Lemmie Evens, MD  07/30/2013  2:08 PM

## 2013-07-30 NOTE — Progress Notes (Signed)
Physical Therapy Wound Treatment Patient Details  Name: [name] MRN: [id number] Date of Birth: 03-Dec-1960  Today's Date: 07/30/2013 Time: 1165-7903 Time Calculation (min): 35 min  Subjective  Subjective: Pt asking to have something to drink. Patient and Family Stated Goals: Get better Prior Treatments: Surgical I&D  Pain Score: Pain Score: 0-No pain  Wound Assessment  Wound / Incision (Open or Dehisced) 07/26/13 Other (Comment) Back Medial covered (Active)  Dressing Type Moist to dry;Gauze (Comment);ABD;Barrier Film (skin prep) 07/30/2013  1:33 PM  Dressing Changed Changed 07/30/2013  1:33 PM  Dressing Status Clean;Dry;Intact 07/30/2013  1:33 PM  Dressing Change Frequency Twice a day 07/30/2013  1:33 PM  Site / Wound Assessment Red;Yellow;Brown 07/30/2013  1:33 PM  % Wound base Red or Granulating 70% 07/30/2013  1:33 PM  % Wound base Yellow 30% 07/30/2013  1:33 PM  % Wound base Black 0% 07/30/2013  1:33 PM  % Wound base Other (Comment) 0% 07/30/2013  1:33 PM  Peri-wound Assessment Intact 07/30/2013  1:33 PM  Wound Length (cm) 10 cm 07/30/2013  1:33 PM  Wound Width (cm) 20 cm 07/30/2013  1:33 PM  Wound Depth (cm) 3.5 cm 07/30/2013  1:33 PM  Undermining (cm) 3.0 undermining 1 o'clock to 4 o'clock, 3.0 undermining 6 o'clock to 8 o'clock, and 3.5 undermining 07/30/2013  1:33 PM  Margins Unattached edges (unapproximated) 07/30/2013  1:46 PM  Closure None 07/30/2013  1:46 PM  Drainage Amount Copious 07/30/2013  1:46 PM  Drainage Description Serous 07/30/2013  1:46 PM  Treatment Debridement (Selective);Hydrotherapy (Pulse lavage);Packing (Saline gauze) 07/30/2013  1:46 PM      Hydrotherapy Pulsed lavage therapy - wound location: upper back Pulsed Lavage with Suction (psi): 8 psi Pulsed Lavage with Suction - Normal Saline Used: 1000 mL Pulsed Lavage Tip: Tip with splash shield Selective Debridement Selective Debridement - Location: upper back Selective Debridement - Tools Used:  Forceps;Scissors Selective Debridement - Tissue Removed: yellow slough   Wound Assessment and Plan  Wound Therapy - Assess/Plan/Recommendations Wound Therapy - Clinical Statement: Pt presents to hydrotherapy with large open wound after I&D of abscess.  Can benefit from hydrotherapy to cleanse wound and promote wound healing. Wound Therapy - Functional Problem List: Decr mobility. Factors Delaying/Impairing Wound Healing: Multiple medical problems;Infection - systemic/local Hydrotherapy Plan: Debridement Wound Therapy - Frequency: 6X / week Wound Therapy - Follow Up Recommendations: Home health RN Wound Plan: See above  Wound Therapy Goals- Improve the function of patient's integumentary system by progressing the wound(s) through the phases of wound healing (inflammation - proliferation - remodeling) by: Decrease Necrotic Tissue to: 10 Decrease Necrotic Tissue - Progress: Goal set today Increase Granulation Tissue to: 90 Increase Granulation Tissue - Progress: Goal set today Goals/treatment plan/discharge plan were made with and agreed upon by patient/family: Yes Time For Goal Achievement: 7 days Wound Therapy - Potential for Goals: Good  Goals will be updated until maximal potential achieved or discharge criteria met.  Discharge criteria: when goals achieved, discharge from hospital, MD decision/surgical intervention, no progress towards goals, refusal/missing three consecutive treatments without notification or medical reason.  GP     Seraphine Gudiel 07/30/2013, 1:57 PM  [place]

## 2013-07-30 NOTE — Progress Notes (Signed)
200 cc of fentenyl wasted. Witness my [place]

## 2013-07-30 NOTE — Progress Notes (Signed)
ANTIBIOTIC CONSULT NOTE - FOLLOW UP  Pharmacy Consult for vancomycin Indication: sepsis  Allergies  Allergen Reactions  . Shellfish Allergy Hives and Swelling    Patient Measurements: Height: 5\' 8"  (172.7 cm) Weight: 276 lb 0.3 oz (125.2 kg) IBW/kg (Calculated) : 68.4   Vital Signs: Temp: 97.8 F (36.6 C) (02/23 1217) Temp src: Oral (02/23 1217) BP: 123/65 mmHg (02/23 1300) Pulse Rate: 73 (02/23 1300) Intake/Output from previous day: 02/22 0701 - 02/23 0700 In: 3346.6 [I.V.:2666.1; NG/GT:150; IV Piggyback:530.5] Out: 2125 [Urine:2125] Intake/Output from this shift: Total I/O In: 700 [I.V.:650; IV Piggyback:50] Out: 1125 [id number]  Labs:  Recent Labs  07/28/13 0442  07/29/13 0134 07/29/13 1000 07/29/13 1800 07/30/13 0400 07/30/13 0751  WBC 28.3*  --  25.4*  --   --  25.8*  --   HGB 12.9*  --  11.1*  --   --  10.3*  --   PLT 138*  --  100*  --   --  71*  --   CREATININE  --   < > 1.97* 2.08* 1.99*  --  1.91*  < > = values in this interval not displayed. Estimated Creatinine Clearance: 58.3 ml/min (by C-G formula based on Cr of 1.91).  Recent Labs  07/30/13 1130  VANCOTROUGH 27.6*     Microbiology: Recent Results (from the past 720 hour(s))  CULTURE, BLOOD (ROUTINE X 2)     Status: None   Collection Time    07/26/13  1:37 PM      Result Value Ref Range Status   Specimen Description BLOOD ARM RIGHT   Final   Special Requests BOTTLES DRAWN AEROBIC AND ANAEROBIC 3CC   Final   Culture  Setup Time     Final   Value: 07/26/2013 16:35     Performed at [facility]   Culture     Final   Value:        BLOOD CULTURE RECEIVED NO GROWTH TO DATE CULTURE WILL BE HELD FOR 5 DAYS BEFORE ISSUING A FINAL NEGATIVE REPORT     Performed at [facility]   Report Status PENDING   Incomplete  CULTURE, BLOOD (ROUTINE X 2)     Status: None   Collection Time    07/26/13  1:53 PM      Result Value Ref Range Status   Specimen Description BLOOD HAND LEFT    Final   Special Requests BOTTLES DRAWN AEROBIC ONLY 2CC   Final   Culture  Setup Time     Final   Value: 07/26/2013 16:35     Performed at [facility]   Culture     Final   Value:        BLOOD CULTURE RECEIVED NO GROWTH TO DATE CULTURE WILL BE HELD FOR 5 DAYS BEFORE ISSUING A FINAL NEGATIVE REPORT     Performed at [facility]   Report Status PENDING   Incomplete  URINE CULTURE     Status: None   Collection Time    07/26/13  3:16 PM      Result Value Ref Range Status   Specimen Description URINE, CATHETERIZED   Final   Special Requests NONE   Final   Culture  Setup Time     Final   Value: 07/26/2013 21:21     Performed at [name]     Final   Value: NO GROWTH     Performed at [facility]  Culture     Final   Value: NO GROWTH     Performed at [facility]   Report Status 07/27/2013 FINAL   Final  MRSA PCR SCREENING     Status: None   Collection Time    07/26/13  5:06 PM      Result Value Ref Range Status   MRSA by PCR NEGATIVE  NEGATIVE Final   Comment:            The GeneXpert MRSA Assay (FDA     approved for NASAL specimens     only), is one component of a     comprehensive MRSA colonization     surveillance program. It is not     intended to diagnose MRSA     infection nor to guide or     monitor treatment for     MRSA infections.  ANAEROBIC CULTURE     Status: None   Collection Time    07/27/13 12:10 PM      Result Value Ref Range Status   Specimen Description ABSCESS BACK   Final   Special Requests PT ON VANCO ZOSYN   Final   Gram Stain     Final   Value: NO WBC SEEN     NO SQUAMOUS EPITHELIAL CELLS SEEN     ABUNDANT GRAM POSITIVE COCCI IN PAIRS     Performed at [facility]   Culture     Final   Value: NO ANAEROBES ISOLATED; CULTURE IN PROGRESS FOR 5 DAYS     Performed at [facility]   Report Status PENDING   Incomplete  CULTURE, ROUTINE-ABSCESS     Status: None   Collection  Time    07/27/13 12:10 PM      Result Value Ref Range Status   Specimen Description ABSCESS BACK   Final   Special Requests PT ON VANCO ZOSYN   Final   Gram Stain     Final   Value: NO WBC SEEN     NO SQUAMOUS EPITHELIAL CELLS SEEN     ABUNDANT GRAM POSITIVE COCCI IN PAIRS     Performed at [facility]   Culture     Final   Value: MULTIPLE ORGANISMS PRESENT, NONE PREDOMINANT     Note: NO STAPHYLOCOCCUS AUREUS ISOLATED NO GROUP A STREP (S.PYOGENES) ISOLATED     Performed at [facility]   Report Status 07/30/2013 FINAL   Final  TISSUE CULTURE     Status: None   Collection Time    07/27/13 12:20 PM      Result Value Ref Range Status   Specimen Description TISSUE BACK   Final   Special Requests PATIENT ON FOLLOWING VANCOMYCIN ZOSYN   Final   Gram Stain     Final   Value: FEW WBC PRESENT,BOTH PMN AND MONONUCLEAR     ABUNDANT GRAM POSITIVE COCCI IN PAIRS     Performed at [facility]   Culture     Final   Value: Culture reincubated for better growth     Performed at [facility]   Report Status PENDING   Incomplete    Anti-infectives   Start     Dose/Rate Route Frequency Ordered Stop   07/27/13 0000  vancomycin (VANCOCIN) IVPB 1000 mg/200 mL premix  Status:  Discontinued     1,000 mg 200 mL/hr over 60 Minutes Intravenous Every 12 hours 07/26/13 1329 07/30/13 1252   07/26/13 2200  piperacillin-tazobactam (  ZOSYN) IVPB 3.375 g     3.375 g 12.5 mL/hr over 240 Minutes Intravenous Every 8 hours 07/26/13 1329     07/26/13 1630  clindamycin (CLEOCIN) IVPB 600 mg     600 mg 100 mL/hr over 30 Minutes Intravenous 3 times per day 07/26/13 1556     07/26/13 1330  piperacillin-tazobactam (ZOSYN) IVPB 3.375 g     3.375 g 100 mL/hr over 30 Minutes Intravenous  Once 07/26/13 1316 07/26/13 1426   07/26/13 1330  vancomycin (VANCOCIN) IVPB 1000 mg/200 mL premix     1,000 mg 200 mL/hr over 60 Minutes Intravenous  Once 07/26/13 1316 07/26/13 1516   07/26/13 1330   vancomycin (VANCOCIN) 500 mg in sodium chloride 0.9 % 100 mL IVPB     500 mg 100 mL/hr over 60 Minutes Intravenous  Once 07/26/13 1329 07/26/13 1721      Assessment: 76 YOM s/p I&D of infected sebaceous cyst on 2/20 being treated with empiric antibiotics for sepsis. A vancomycin trough was checked and it was elevated at 27.6. Scr is trending down.  Zosyn 2/19>> Vanc 2/19>> Clinda 2/19>>  2/20 back tissue - NGTD 2/20 abscess - NGTD 2/19 urine - NEG 2/19 BCx - NGTD  Goal of Therapy:  Vancomycin trough level 15-20 mcg/ml  Plan:  1. Hold vancomycin - check a level with AM labs 2. F/u renal fxn, C&S, clinical status  Salome Arnt, PharmD, BCPS Pager # [phone] 07/30/2013 1:40 PM

## 2013-07-30 NOTE — ED Provider Notes (Signed)
Medical screening examination/treatment/procedure(s) were performed by non-physician practitioner and as supervising physician I was immediately available for consultation/collaboration.      Tanna Furry, MD 07/30/13 [phone]

## 2013-07-30 NOTE — Discharge Summary (Signed)
Physician Discharge Summary    PCCM Discharge Summary   Patient ID: ROSALIE AKEY MRN: [id number] DOB/AGE: [birth year] 53 y.o.  Admit date: 07/26/2013 Discharge date: 07/30/2013  Discharge Diagnoses:  Septic shock related to necrotizing fasciitis NSTEMI HTN Lactic acidosis Diastolic CHF Fabry's disease AKI on CKD Hypernatremia Anemia Thrombocytopenia DM    Detailed Hospital Course:  Patient is a 53 yo male who presented to the ED from the day surgery center after having a fall in the parking lot prior to outpatient drainage of a left back abscess. Noted to have sub-q air on CXR concerning for necrotizing fasciitis of the left neck. He was admitted to the ICU and evaluated by surgery. He was noted to have an elevated troponin prior to surgery and cardiology felt this was likely strain related to his CHF and shock state. He was started on pressors and taken to surgery for debridement. He returned to the ICU intubated and remained on pressors. His pressors were weaned off the day prior to discharge and the patient self extubated the day prior to discharge. He was maintained on clindamycin, vanc, and zosyn during his hospitalization. The clindamycin was d/c'd prior to discharge. His vanc was held the day of discharge related to an elevated vanc trough with the plan to check a vanc level 2/24 am with potential redosing of vanc at that time. He was started on hydrotherapy for his wound and surgery felt he was able to be discharged to [facility] for further wound management and antibiotics.    Discharge Plan by diagnoses   Discharge Plan: Septic shock related to necrotizing fasciitis - patient is to continue IV antibiotics. At time of discharge vanc is being held given elevated vanc trough. There should be a vanc level drawn on the morning of 2/24 to evaluate vanc kinetics and to determine if vanc should be restarted. If the vanc level remains elevated this should continue to be evaluated on a  daily basis to determine when vanc should be restarted. Cultures appear to be strep in nature, though are not final at this time. Once the cultures are final the antibiotics will need to be narrowed as seen fit.  NSTEMI - patient was followed by cardiology in the hospital. He was started on aspirin the day of discharge as he was not going back to surgery. Troponin collected prior to discharge to provide trend was 0.82. He will need follow-up with cardiology regarding this and his CHF. Cardiology states not a candidate for cath until he clears his infection.  HTN - the patient had normotensive BPs once off pressors. His home blood pressure medications were held. Consider restarting these as seen fit.  Diastolic CHF - monitor volume status and f/u with cardiology as outpatient. Holding patients home eplerenone given recent AKI. Consider adding back once renal function improves.  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 Followed by Dr Ambrose Pancoast. Consider holding fabrazyme given current renal dysfunction.  AKI on CKD - patient will need daily BMET to follow renal function and electrolytes. Keep follow-up with Dr Ambrose Pancoast and associated at [facility].  Hypernatremia - monitor daily BMET. Should improve with allowing PO intake.  Anemia - will need to monitor daily CBCs.  Thrombocytopenia - down trending at time of discharge to 70. Subq heparin was discontinued. May be related to sepsis vs heparin. Will need to trend this daily.  DM - discharge on lantus 30 u with moderate sliding scale.  Gout - hold  home allopurinol and colchicine given patient with renal failure. Consider restarting one renal function improves.   Significant Hospital tests/ studies/ interventions and procedures  2/19 - Admit from surgical center after a fall in parking lot, CXR c/w sub-q air concerning for nec fasc, troponin to 12  2/20 OR for debridement of left back abscess  2/21- remained in  shock  2/21 renal US normal  2/22 - remained in shock on pressors, febrile  2/22 - self extubated  2/22 - echo EF 60-65%, LVH, grade 1 diastolic dysfunction, appearance of myocardium suggests appearance of infiltrative disease  2/23 - off pressors  Consults - cardiology, general surgery  Discharge Exam: BP 103/61  Pulse 66  Temp(Src) 97.8 F (36.6 C) (Oral)  Resp 23  Ht 5\' 8"  (1.727 m)  Wt 125.2 kg (276 lb 0.3 oz)  BMI 41.98 kg/m2  SpO2 100%  General: Morbidly obese male lying in bed  Neuro: alert, answers questions appropriately, moves all extremities equally  HEENT: Rachel in place  Cardiovascular: rrr, no m/r/g  Lungs: CTAB, no wheezes or crackles  Abdomen: Round/soft, bs +  Musculoskeletal: No acute deformities, 2+ edema BLE  Skin: Warm/dry  Wound undermined edges, visible muscle at base, necrotic towards right border   Labs at discharge Lab Results  Component Value Date   CREATININE 1.91* 07/30/2013   BUN 42* 07/30/2013   NA 151* 07/30/2013   K 4.0 07/30/2013   CL 116* 07/30/2013   CO2 24 07/30/2013   Lab Results  Component Value Date   WBC 25.8* 07/30/2013   HGB 10.3* 07/30/2013   HCT 32.0* 07/30/2013   MCV 91.7 07/30/2013   PLT 71* 07/30/2013   Lab Results  Component Value Date   ALT 14 07/29/2013   AST 17 07/29/2013   ALKPHOS 133* 07/29/2013   BILITOT 0.5 07/29/2013   Lab Results  Component Value Date   INR 1.17 07/30/2013   INR 1.27 07/29/2013   INR 1.34 07/26/2013    Current radiology studies Dg Chest [employer] 1 View  07/30/2013   CLINICAL DATA:  53 year old male intubated, shortness of breath. Metabolic acidosis, necrotizing fasciitis, sepsis. Initial encounter.  EXAM: PORTABLE CHEST - 1 VIEW  COMPARISON:  07/29/2013 and earlier.  FINDINGS: Portable AP semi upright view at 0402 hr. Extubated. Enteric tube removed.  Stable lung volumes. Stable right IJ central line. Stable left chest cardiac pacemaker. Stable cardiac size and mediastinal contours. No pneumothorax,  pleural effusion or consolidation. Pulmonary vascularity within normal limits.  IMPRESSION: 1. Extubated.  Enteric tube removed. 2. Stable lung volumes and no acute cardiopulmonary abnormality.   Electronically Signed   By: Lars Pinks M.D.   On: 07/30/2013 07:31   Dg Chest Port 1 View  07/29/2013   CLINICAL DATA:  Acute respiratory failure. On ventilator.  EXAM: PORTABLE CHEST - 1 VIEW  COMPARISON:  07/28/2013  FINDINGS: Support lines and tubes in appropriate position. Both lungs remain clear. No evidence of pneumothorax. Heart size remains normal.  IMPRESSION: No active disease.   Electronically Signed   By: Earle Gell M.D.   On: 07/29/2013 07:53    Disposition:  01-Home or Self Care   Future Appointments Provider Department Dept Phone   08/03/2013 8:00 AM [facility] [place] [phone]   08/14/2013 11:00 AM Merrie Roof, MD [facility] Surgery, [place]   08/17/2013 11:00 AM [facility] [place] [phone]   08/22/2013 8:35 AM [facility]  [place] [phone]   08/31/2013 1:00 PM [facility] Room [facility] [phone]   09/14/2013 8:00 AM [facility] Room [facility] [phone]   09/28/2013 8:00 AM [facility] Room [facility] [phone]       Medication List    STOP taking these medications       allopurinol 300 MG tablet  Commonly known as:  ZYLOPRIM     COLCRYS 0.6 MG tablet  Generic drug:  colchicine     eplerenone 50 MG tablet  Commonly known as:  INSPRA     glyBURIDE 5 MG tablet  Commonly known as:  DIABETA     losartan-hydrochlorothiazide 50-12.5 MG per tablet  Commonly known as:  HYZAAR      TAKE these medications       acetaminophen 500 MG tablet  Commonly known as:  TYLENOL  Only taking 1 hour before infusion.  AS premed for Fabrazyme     albuterol 108 (90 BASE) MCG/ACT inhaler  Commonly known as:  PROVENTIL HFA;VENTOLIN  HFA  Inhale 2 puffs into the lungs every 6 (six) hours as needed for wheezing.     aspirin 81 MG tablet  Take 81 mg by mouth daily.     FABRAZYME IV  Inject into the vein every 14 (fourteen) days.     insulin aspart 100 UNIT/ML injection  Commonly known as:  novoLOG  Inject 0-15 Units into the skin every 4 (four) hours.     insulin glargine 100 UNIT/ML injection  Commonly known as:  LANTUS  Inject 0.3 mLs (30 Units total) into the skin at bedtime.     piperacillin-tazobactam 3.375 GM/50ML IVPB  Commonly known as:  ZOSYN  Inject 50 mLs (3.375 g total) into the vein every 8 (eight) hours.        Discharged Condition: stable    Tommi Rumps 07/30/2013, 3:15 PM  Physician Statement:   The Patient was personally examined, the discharge assessment and plan has been personally reviewed and I agree with Tommi Rumps, MD assessment and plan. > 30 minutes of time have been dedicated to discharge assessment, planning and discharge instructions.    Laithan Conchas V.

## 2013-07-30 NOTE — Progress Notes (Signed)
Notified Dr. Elsworth Soho and pharmacist of pt's Vanc trough of 27.6. 1200 vanc dose held.

## 2013-07-30 NOTE — Progress Notes (Addendum)
Name: [name] MRN: [id number] DOB: [birth year]    ADMISSION DATE:  07/26/2013 CONSULTATION DATE:  07/26/2013  REFERRING MD :  Jeneen Rinks PRIMARY SERVICE: PCCM  CHIEF COMPLAINT:  Necrotizing fasciitis  BRIEF PATIENT DESCRIPTION: 53 y/o M admitted to [facility] with findings of necrotizing fasciitis of L neck.   SIGNIFICANT EVENTS / STUDIES:  2/19 - Admit from surgical center after a fall in parking lot, CXR c/w sub-q air concerning for nec fasc, troponin to 12 2/20 OR for debridement of left back abscess 2/21- remains in shock 2/21 renal US normal 2/22 - remains in shock on pressors, febrile 2/22 - self extubated 2/22 - echo EF 60-65%, LVH, grade 1 diastolic dysfunction, appearance of myocardium suggests appearance of infiltrative disease 2/23 - off pressors  LINES / TUBES: R IJ TLC 2/19>>> ETT 2/20 >>>2/22 L brachial Art line 2/19 >>>2/22  CULTURES: BCx2 2/19>>> ngtd UC 2/19>>> neg Wound 2/19>>>abundant GPC in pairs Abscess cx 2/20 >>>abundant GPC in pairs Anaerobic cx 2/20 >>>abundant GPC in pairs  ANTIBIOTICS: Vanco 2/19>>>  Zosyn 2/19>>>  Clinda 2/19>>>  SUBJECTIVE: self extubated yesterday. Off pressors. States is thirsty. Denies pain  VITAL SIGNS: Temp:  [98.1 F (36.7 C)-98.4 F (36.9 C)] 98.4 F (36.9 C) (02/23 0441) Pulse Rate:  [34-94] 67 (02/23 0600) Resp:  [13-32] 20 (02/23 0600) BP: (88-127)/(47-94) 101/52 mmHg (02/23 0600) SpO2:  [96 %-100 %] 100 % (02/23 0600) FiO2 (%):  [40 %] 40 % (02/22 0749) Weight:  [276 lb 0.3 oz (125.2 kg)] 276 lb 0.3 oz (125.2 kg) (02/23 0300) HEMODYNAMICS: CVP:  [8 mmHg] 8 mmHg VENTILATOR SETTINGS: Vent Mode:  [-] PRVC FiO2 (%):  [40 %] 40 % Set Rate:  [32 bmp] 32 bmp Vt Set:  [550 mL] 550 mL PEEP:  [5 cmH20] 5 cmH20 Pressure Support:  [5 cmH20] 5 cmH20 Plateau Pressure:  [24 cmH20] 24 cmH20 INTAKE / OUTPUT: Intake/Output     02/22 0701 - 02/23 0700   I.V. (mL/kg) 2541.1 (20.3)   NG/GT 150   IV Piggyback 480.5    Total Intake(mL/kg) 3171.6 (25.3)   Urine (mL/kg/hr) 2125 (0.7)   Total Output 2125   Net +1046.6         PHYSICAL EXAMINATION: General: Morbidly obese male lying in bed Neuro: alert, answers questions appropriately, moves all extremities equally HEENT: [place] in place  Cardiovascular: rrr, no m/r/g  Lungs: CTAB, no wheezes or crackles  Abdomen: Round/soft, bs +  Musculoskeletal: No acute deformities, 2+ edema BLE Skin: Warm/dry Wound undermined edges, visible muscle at base, necrotic towards right border   LABS:  CBC  Recent Labs Lab 07/28/13 0442 07/29/13 0134 07/30/13 0400  WBC 28.3* 25.4* 25.8*  HGB 12.9* 11.1* 10.3*  HCT 38.0* 32.9* 32.0*  PLT 138* 100* 71*   Coag's  Recent Labs Lab 07/26/13 1528 07/29/13 1000 07/30/13 0400  APTT 25  --   --   INR 1.34 1.27 1.17   BMET  Recent Labs Lab 07/29/13 0134 07/29/13 1000 07/29/13 1800  NA 147 152* 150*  K 3.7 3.6* 3.8  CL 113* 116* 113*  CO2 21 23 22   BUN 50* 49* 46*  CREATININE 1.97* 2.08* 1.99*  GLUCOSE 352* 268* 210*   Electrolytes  Recent Labs Lab 07/29/13 0134 07/29/13 1000 07/29/13 1334 07/29/13 1800 07/30/13 0400  CALCIUM 8.0* 7.8*  --  7.9*  --   MG 2.1  --  2.0  --  1.9  PHOS 1.7*  --  4.4  --  4.4   Sepsis Markers  Recent Labs Lab 07/26/13 1417 07/26/13 1529  LATICACIDVEN 7.25* 4.9*   ABG  Recent Labs Lab 07/29/13 0346 07/29/13 1238 07/30/13 0500  PHART 7.485* 7.339* 7.353  PCO2ART 28.6* 42.8 43.1  PO2ART 136.0* 127.0* 120.0*   Liver Enzymes  Recent Labs Lab 07/26/13 1316 07/29/13 0134  AST 18 17  ALT 19 14  ALKPHOS 170* 133*  BILITOT 0.3 0.5  ALBUMIN 2.1* 1.5*   Cardiac Enzymes  Recent Labs Lab 07/26/13 2013 07/27/13 0130  TROPONINI 7.62* 12.33*   Glucose  Recent Labs Lab 07/29/13 0803 07/29/13 1122 07/29/13 1607 07/29/13 1940 07/29/13 2354 07/30/13 0352  GLUCAP 306* 197* 141* 162* 203* 215*    Imaging US Renal  07/28/2013   CLINICAL  DATA:  Chronic renal insufficiency.  EXAM: RENAL/URINARY TRACT ULTRASOUND COMPLETE  COMPARISON:  CT scan dated 04/04/2009  FINDINGS: Right Kidney:  Length: 12.2 cm. Echogenicity within normal limits. No mass or hydronephrosis visualized.  Left Kidney:  Length: 12.7 cm. Echogenicity within normal limits. No mass or hydronephrosis visualized.  Bladder:  Foley catheter in place. The bladder wall appears slightly thickened.  IMPRESSION: Normal appearing kidneys.   Electronically Signed   By: Rozetta Nunnery M.D.   On: 07/28/2013 08:57   Dg Chest Port 1 View  07/29/2013   CLINICAL DATA:  Acute respiratory failure. On ventilator.  EXAM: PORTABLE CHEST - 1 VIEW  COMPARISON:  07/28/2013  FINDINGS: Support lines and tubes in appropriate position. Both lungs remain clear. No evidence of pneumothorax. Heart size remains normal.  IMPRESSION: No active disease.   Electronically Signed   By: Earle Gell M.D.   On: 07/29/2013 07:53   Dg Chest Port 1 View  07/28/2013   CLINICAL DATA:  Intubation.  EXAM: PORTABLE CHEST - 1 VIEW  COMPARISON:  07/27/2013  FINDINGS: The tip of the endotracheal tube projects at the inferior margin of the clavicular heads, approximately 8 cm above the carina and not significantly changed. Right jugular central venous catheter remains in place with tip overlying the lower SVC. Enteric tube courses into the left upper abdomen with tip not imaged. Left-sided dual lead pacemaker is partially imaged. Cardiac silhouette appears upper limits of normal in size, unchanged. Lung volumes remain low. There is no evidence of airspace consolidation, edema, or definite pleural effusion. No pneumothorax is identified.  IMPRESSION: 1. Unchanged appearance of endotracheal tube and CVC. 2. Mildly low lung volumes without evidence of acute airspace disease.   Electronically Signed   By: Logan Bores   On: 07/28/2013 09:06   CXR no infiltrates noted  ASSESSMENT / PLAN:  PULMONARY  A: Intubated for  surgery Extubated self P:  -extubated self 2/22 -abg if respiratory status declines  CARDIOVASCULAR  A:  Septic Shock 2/20 - resolved Diastolic CHF / Hypertrophic Cardiomyopathy  HTN  H/o Mobitz II  Hx Lymphedema LE's  Cortisol 53.8 Elevated lactic acid Elevated troponin No adrenal insuff P:  -pressors off -appreciate cards consult - echo with LVH -will start on aspirin  RENAL  A:  Lactic Acidosis improved Fabry's Disease  Aki on Chronic Renal Insufficiency - improved  Hypokalemia Non AG hypernatremia P:  -bmet daily -renal US normal -will let take PO for hypernatremia  GASTROINTESTINAL  A:  Morbid Obesity  P:  -ppi -LFTs wnl -sips and chips now, if does well will advance diet  HEMATOLOGIC  A:  DVT Proph  Anemia - ?dilutional vs surgical Thrombocytopenia (sepsis, dilution) P:  -SCD's  -  hold subq heparin with thrombocytopenia   INFECTIOUS  A:  Necrotizing Fasciitis, fat, soft tissue  LU Posterior Abscess  Stain likely strep P:  -CCS consult for surgical debridement - appreciate their help -abx as above until speciation -follow cultures  -clinda for toxin inhibition- can dc now, simplify in 24h  ENDOCRINE  A:  DM hyperglycemia - improved Gout  P:  -increase lantus  -SSI  -monitor cbgs  NEUROLOGIC  A:  Pain, vent dysycrony P:  -fentanyl prn -PT eval  Tommi Rumps, MD [place] PGY-2  TODAY'S SUMMARY: monitor resp status, remove a-line, give diet, PT involvement,  transfer to SDU ? candidate for LTAC  Care during the described time interval was provided by me and/or other providers on the critical care team.  I have reviewed this patient's available data, including medical history, events of note, physical examination and test results as part of my evaluation  CC time x  76m   Amela Handley V.  [phone]

## 2013-07-30 NOTE — Progress Notes (Signed)
[name], male   DOB: [birth year], 53 y.o.   MRN: [id number] 3 Days Post-Op  Subjective: Pt feels better.  Self-extubated yesterday.  Minimal pain.  Objective: Vital signs in last 24 hours: Temp:  [98 F (36.7 C)-98.4 F (36.9 C)] 98 F (36.7 C) (02/23 0809) Pulse Rate:  [34-94] 67 (02/23 0700) Resp:  [16-25] 16 (02/23 0700) BP: (88-124)/(48-94) 92/52 mmHg (02/23 0700) SpO2:  [97 %-100 %] 97 % (02/23 0700) Weight:  [276 lb 0.3 oz (125.2 kg)] 276 lb 0.3 oz (125.2 kg) (02/23 0300) Last BM Date: 07/26/13  Intake/Output from previous day: 02/22 0701 - 02/23 0700 In: 3171.6 [I.V.:2541.1; NG/GT:150; IV Piggyback:480.5] Out: 2475 [Urine:2475] Intake/Output this shift:    PE: Skin: wound overall is fairly clean. Muscle is red and clean.  There is some fibrin at the base and along some of the undermining surfaces.   Lab Results:   Recent Labs  07/29/13 0134 07/30/13 0400  WBC 25.4* 25.8*  HGB 11.1* 10.3*  HCT 32.9* 32.0*  PLT 100* 71*   BMET  Recent Labs  07/29/13 1800 07/30/13 0751  NA 150* 151*  K 3.8 4.0  CL 113* 116*  CO2 22 24  GLUCOSE 210* 199*  BUN 46* 42*  CREATININE 1.99* 1.91*  CALCIUM 7.9* 7.9*   PT/INR  Recent Labs  07/29/13 1000 07/30/13 0400  LABPROT 15.6* 14.7  INR 1.27 1.17   CMP     Component Value Date/Time   NA 151* 07/30/2013 0751   K 4.0 07/30/2013 0751   CL 116* 07/30/2013 0751   CO2 24 07/30/2013 0751   GLUCOSE 199* 07/30/2013 0751   BUN 42* 07/30/2013 0751   CREATININE 1.91* 07/30/2013 0751   CALCIUM 7.9* 07/30/2013 0751   PROT 5.7* 07/29/2013 0134   ALBUMIN 1.5* 07/29/2013 0134   AST 17 07/29/2013 0134   ALT 14 07/29/2013 0134   ALKPHOS 133* 07/29/2013 0134   BILITOT 0.5 07/29/2013 0134   GFRNONAA 39* 07/30/2013 0751   GFRAA 45* 07/30/2013 0751   Lipase  No results found for this basename: lipase       Studies/Results: Dg Chest Port 1 View  07/30/2013   CLINICAL DATA:  53 year old male intubated, shortness of  breath. Metabolic acidosis, necrotizing fasciitis, sepsis. Initial encounter.  EXAM: PORTABLE CHEST - 1 VIEW  COMPARISON:  07/29/2013 and earlier.  FINDINGS: Portable AP semi upright view at 0402 hr. Extubated. Enteric tube removed.  Stable lung volumes. Stable right IJ central line. Stable left chest cardiac pacemaker. Stable cardiac size and mediastinal contours. No pneumothorax, pleural effusion or consolidation. Pulmonary vascularity within normal limits.  IMPRESSION: 1. Extubated.  Enteric tube removed. 2. Stable lung volumes and no acute cardiopulmonary abnormality.   Electronically Signed   By: Lars Pinks M.D.   On: 07/30/2013 07:31   Dg Chest Port 1 View  07/29/2013   CLINICAL DATA:  Acute respiratory failure. On ventilator.  EXAM: PORTABLE CHEST - 1 VIEW  COMPARISON:  07/28/2013  FINDINGS: Support lines and tubes in appropriate position. Both lungs remain clear. No evidence of pneumothorax. Heart size remains normal.  IMPRESSION: No active disease.   Electronically Signed   By: Earle Gell M.D.   On: 07/29/2013 07:53    Anti-infectives: Anti-infectives   Start     Dose/Rate Route Frequency Ordered Stop   07/27/13 0000  vancomycin (VANCOCIN) IVPB 1000 mg/200 mL premix     1,000 mg 200 mL/hr over 60 Minutes Intravenous Every 12  hours 07/26/13 1329     07/26/13 2200  piperacillin-tazobactam (ZOSYN) IVPB 3.375 g     3.375 g 12.5 mL/hr over 240 Minutes Intravenous Every 8 hours 07/26/13 1329     07/26/13 1630  clindamycin (CLEOCIN) IVPB 600 mg     600 mg 100 mL/hr over 30 Minutes Intravenous 3 times per day 07/26/13 1556     07/26/13 1330  piperacillin-tazobactam (ZOSYN) IVPB 3.375 g     3.375 g 100 mL/hr over 30 Minutes Intravenous  Once 07/26/13 1316 07/26/13 1426   07/26/13 1330  vancomycin (VANCOCIN) IVPB 1000 mg/200 mL premix     1,000 mg 200 mL/hr over 60 Minutes Intravenous  Once 07/26/13 1316 07/26/13 1516   07/26/13 1330  vancomycin (VANCOCIN) 500 mg in sodium chloride 0.9 % 100  mL IVPB     500 mg 100 mL/hr over 60 Minutes Intravenous  Once 07/26/13 1329 07/26/13 1721       Assessment/Plan  1. POD3, s/p I&D of large back abscess 2. Septic shock, improving, WBC still 25K 3. DM 4. Fabry's disease 5. NSTEMI  Plan: 1. Will ask PT hydrotherapy to work on wound to continue cleaning it up.  Do not see any need for further debridement at this point. 2. Cont abx therapy 3. Hopefully will be able to eventually get a VAC on this.    LOS: 4 days    Foy Vanduyne E 07/30/2013, 9:16 AM Pager: [phone]

## 2013-07-30 NOTE — Progress Notes (Signed)
Patient tolerated hydrotherapy.  Continue hydro for a few days, then he might be a candidate for VAC placement.  Patient moving out to floor.  Imogene Burn. Georgette Dover, MD, [facility]  General/ Trauma Surgery  07/30/2013 2:09 PM

## 2013-07-31 LAB — URINE CULTURE
Colony Count: NO GROWTH
Culture: NO GROWTH

## 2013-07-31 LAB — TISSUE CULTURE

## 2013-08-01 LAB — ANAEROBIC CULTURE: Gram Stain: NONE SEEN

## 2013-08-01 LAB — CULTURE, BLOOD (ROUTINE X 2)
Culture: NO GROWTH
Culture: NO GROWTH

## 2013-08-03 ENCOUNTER — Other Ambulatory Visit (HOSPITAL_COMMUNITY): Payer: Self-pay | Admitting: Nephrology

## 2013-08-03 ENCOUNTER — Encounter (HOSPITAL_COMMUNITY): Payer: Self-pay

## 2013-08-03 ENCOUNTER — Encounter (HOSPITAL_COMMUNITY)
Admission: RE | Admit: 2013-08-03 | Discharge: 2013-08-03 | Disposition: A | Discharge status: Home / Self Care | Payer: Medicare Other | Source: Ambulatory Visit | Attending: Nephrology | Admitting: Nephrology

## 2013-08-03 HISTORY — DX: Edema, unspecified: R60.9

## 2013-08-03 MED ORDER — SODIUM CHLORIDE 0.9 % IJ SOLN
10.0000 mL | INTRAMUSCULAR | Status: AC | PRN
  Administered 2013-08-03: 10 mL

## 2013-08-03 MED ORDER — SODIUM CHLORIDE 0.9 % IV SOLN
115.0000 mg | INTRAVENOUS | Status: DC
  Administered 2013-08-03: 115 mg via INTRAVENOUS
  Filled 2013-08-03: qty 23

## 2013-08-03 MED ORDER — ACETAMINOPHEN 500 MG PO TABS
500.0000 mg | ORAL_TABLET | ORAL | Status: DC
  Administered 2013-08-03: 500 mg via ORAL
  Filled 2013-08-03: qty 1

## 2013-08-03 MED ORDER — FEXOFENADINE HCL 180 MG PO TABS
180.0000 mg | ORAL_TABLET | ORAL | Status: DC
  Administered 2013-08-03: 180 mg via ORAL
  Filled 2013-08-03: qty 1

## 2013-08-03 MED ORDER — LIP MEDEX EX OINT
TOPICAL_OINTMENT | CUTANEOUS | Status: DC | PRN
  Filled 2013-08-03: qty 7

## 2013-08-03 MED ORDER — NON FORMULARY: 180.0000 mg | Status: DC

## 2013-08-03 MED ORDER — SODIUM CHLORIDE 0.9 % IV SOLN
INTRAVENOUS | Status: DC
  Administered 2013-08-03: 250 mL via INTRAVENOUS

## 2013-08-03 NOTE — Progress Notes (Signed)
Uneventful infusion of Fabrazyme. Report called to Truddie Hidden RN at [facility] by me Corey Harold has been called for transport back to [employer] [facility]

## 2013-08-03 NOTE — Progress Notes (Signed)
Report given to [facility] staff for transport to Nursing facility.

## 2013-08-03 NOTE — Progress Notes (Signed)
Pt had recent hospitalization and called Stanton Kidney at [facility] office to request new orders for his every 2 week infusion of Fabrazyme. as he had just been discharged She was unaware he had been hospitalized and new orders were sent. 2/24. Vedia Coffer call from Margaretmary Lombard RN at [facility] on 08/01/13 that pt was current resident there after a recent hospitilazation and was inquiring as to his appointment time since the Fabrazyme could not be given at their facility. She said that he would arrive via [facility]. Placed a call to Dr [employer] office to confirm that he was to receive the Fabrazyme since he was a current resident at [facility] and confirmed he was to get his infusion at [employer] [facility] Stay 08/03/13  Pt arrived from [place] via [place] today for his Fabrazyme infusion for Fabray's Disease via stretcher and he was moved from stretcher to stretcher in [place] [facility]. When pt is receiving his Fabrazyme on outpatient basis he always takes Allegra and Tylenol prior to arrival as he was instructed to do by Dr Campbell Riches. Called [facility] and confirmed that pt has not had any premeds. Placed a call to Dr [employer] office to askfor orders to giveTylenol and Allegra prior to fabrazyme infusion. Office is closed but Dr C has been beeped and awaiting call back regarding this before starting this.  [facility] states that pt was on isolation at [facility] and we placed patient on contact isolation due to draining wound upper thoracic area of back. Area has an occlusive dressing that is dry and intact. Pt had a right IJ triple lumen central cath . Brown port good blood return and was flushed with 3 cc NS then connected to Saline at [facility] until Fabrazyme hung. Dressing and BioPatch changed on central line per protocol site is CDI and no redness or drainage. Mouth care was also done and Petroleum/Carmax applied to lips

## 2013-08-03 NOTE — Discharge Instructions (Signed)
FABRAZYME Agalsidase Beta injection What is this medicine? AGALSIDASE BETA is used to replace an enzyme that is missing in patients with Fabry disease. It is not a cure. This medicine may be used for other purposes; ask your health care provider or pharmacist if you have questions. COMMON BRAND NAME(S): Fabrazyme What should I tell my health care provider before I take this medicine? They need to know if you have any of these conditions: -heart disease -an unusual or allergic reaction to agalsidase beta, mannitol, other medicines, foods, dyes, or preservatives -pregnant or trying to get pregnant -breast-feeding How should I use this medicine? This medicine is for infusion into a vein. It is given by a health care professional in a hospital or clinic setting. Talk to your pediatrician regarding the use of this medicine in children. Special care may be needed. Overdosage: If you think you have taken too much of this medicine contact a poison control center or emergency room at once. NOTE: This medicine is only for you. Do not share this medicine with others. What if I miss a dose? It is important not to miss your dose. Call your doctor or health care professional if you are unable to keep an appointment. What may interact with this medicine? -amiodarone -chloroquine -gentamicin -hydroxychloroquine -monobenzone This list may not describe all possible interactions. Give your health care provider a list of all the medicines, herbs, non-prescription drugs, or dietary supplements you use. Also tell them if you smoke, drink alcohol, or use illegal drugs. Some items may interact with your medicine. What should I watch for while using this medicine? Visit your doctor or health care professional for regular checks on your progress. Tell your doctor or healthcare professional if your symptoms do not start to get better or if they get worse. There is a registry for patients with Fabry disease. The  registry is used to gather information about the disease and its effects. Talk to your health care provider if you would like to join the registry. What side effects may I notice from receiving this medicine? Side effects that you should report to your doctor or health care professional as soon as possible: -allergic reactions like skin rash, itching or hives, swelling of the face, lips, or tongue -breathing problems -chest pain, tightness -depression -dizziness -fast, irregular heart beat -swelling of the arms or legs Side effects that usually do not require medical attention (report to your doctor or health care professional if they continue or are bothersome): -aches or pains -anxiety -fever or chills at the time of injection -headache -nausea, vomiting -stomach pain, upset This list may not describe all possible side effects. Call your doctor for medical advice about side effects. You may report side effects to FDA at 1-800-FDA-1088. Where should I keep my medicine? This drug is given in a hospital or clinic and will not be stored at home. NOTE: This sheet is a summary. It may not cover all possible information. If you have questions about this medicine, talk to your doctor, pharmacist, or health care provider.  2014, Elsevier/Gold Standard. (2006-01-31 12:25:00)

## 2013-08-14 ENCOUNTER — Encounter (INDEPENDENT_AMBULATORY_CARE_PROVIDER_SITE_OTHER): Payer: Medicare Other | Admitting: General Surgery

## 2013-08-17 ENCOUNTER — Other Ambulatory Visit (HOSPITAL_COMMUNITY): Payer: Self-pay | Admitting: Nephrology

## 2013-08-17 ENCOUNTER — Encounter (HOSPITAL_COMMUNITY): Payer: Self-pay

## 2013-08-17 ENCOUNTER — Encounter (HOSPITAL_COMMUNITY)
Admission: RE | Admit: 2013-08-17 | Discharge: 2013-08-17 | Disposition: A | Discharge status: Home / Self Care | Payer: Medicare Other | Source: Ambulatory Visit | Attending: Nephrology | Admitting: Nephrology

## 2013-08-17 DIAGNOSIS — E756 Lipid storage disorder, unspecified: Secondary | ICD-10-CM | POA: Insufficient documentation

## 2013-08-17 MED ORDER — SODIUM CHLORIDE 0.9 % IV SOLN
115.0000 mg | INTRAVENOUS | Status: DC
  Administered 2013-08-17: 115 mg via INTRAVENOUS
  Filled 2013-08-17: qty 23

## 2013-08-17 MED ORDER — SODIUM CHLORIDE 0.9 % IV SOLN
INTRAVENOUS | Status: DC
  Administered 2013-08-17: 250 mL via INTRAVENOUS

## 2013-08-17 MED ORDER — ACETAMINOPHEN 500 MG PO TABS: 500.0000 mg | ORAL_TABLET | ORAL | Status: DC

## 2013-08-17 MED ORDER — FEXOFENADINE HCL 180 MG PO TABS: 180.0000 mg | ORAL_TABLET | ORAL | Status: DC

## 2013-08-17 NOTE — Discharge Instructions (Signed)
IF YOU ARE GOING TO BE 15 OR MINUTES LATE FOR YOUR APPOINTMENT, PLEASE CALL [phone] TO MAKE OTHER ARRANGEMENTS FOR YOUR TREATMENT  IF YOU ARRIVE EARLY FOR YOUR SCHEDULED APPOINTMENT , YOU MAY HAVE TO WAIT UNTIL YOUR SCHEDULED TIME.Agalsidase Beta injection What is this medicine? AGALSIDASE BETA is used to replace an enzyme that is missing in patients with Fabry disease. It is not a cure. This medicine may be used for other purposes; ask your health care provider or pharmacist if you have questions. COMMON BRAND NAME(S): Fabrazyme What should I tell my health care provider before I take this medicine? They need to know if you have any of these conditions: -heart disease -an unusual or allergic reaction to agalsidase beta, mannitol, other medicines, foods, dyes, or preservatives -pregnant or trying to get pregnant -breast-feeding How should I use this medicine? This medicine is for infusion into a vein. It is given by a health care professional in a hospital or clinic setting. Talk to your pediatrician regarding the use of this medicine in children. Special care may be needed. Overdosage: If you think you have taken too much of this medicine contact a poison control center or emergency room at once. NOTE: This medicine is only for you. Do not share this medicine with others. What if I miss a dose? It is important not to miss your dose. Call your doctor or health care professional if you are unable to keep an appointment. What may interact with this medicine? -amiodarone -chloroquine -gentamicin -hydroxychloroquine -monobenzone This list may not describe all possible interactions. Give your health care provider a list of all the medicines, herbs, non-prescription drugs, or dietary supplements you use. Also tell them if you smoke, drink alcohol, or use illegal drugs. Some items may interact with your medicine. What should I watch for while using this medicine? Visit your doctor or health  care professional for regular checks on your progress. Tell your doctor or healthcare professional if your symptoms do not start to get better or if they get worse. There is a registry for patients with Fabry disease. The registry is used to gather information about the disease and its effects. Talk to your health care provider if you would like to join the registry. What side effects may I notice from receiving this medicine? Side effects that you should report to your doctor or health care professional as soon as possible: -allergic reactions like skin rash, itching or hives, swelling of the face, lips, or tongue -breathing problems -chest pain, tightness -depression -dizziness -fast, irregular heart beat -swelling of the arms or legs Side effects that usually do not require medical attention (report to your doctor or health care professional if they continue or are bothersome): -aches or pains -anxiety -fever or chills at the time of injection -headache -nausea, vomiting -stomach pain, upset This list may not describe all possible side effects. Call your doctor for medical advice about side effects. You may report side effects to FDA at 1-800-FDA-1088. Where should I keep my medicine? This drug is given in a hospital or clinic and will not be stored at home. NOTE: This sheet is a summary. It may not cover all possible information. If you have questions about this medicine, talk to your doctor, pharmacist, or health care provider.  2014, Elsevier/Gold Standard. (2006-01-31 12:25:00)

## 2013-08-17 NOTE — Progress Notes (Signed)
Pt arrived today from [facility] via [facility] [employer] [facility]. Able to ambulate from stretcher to recliner in Short Stay for his Fabrazyme infusion. Pt is awake alert and eager to get his infusion today.

## 2013-08-21 DIAGNOSIS — N189 Chronic kidney disease, unspecified: Secondary | ICD-10-CM | POA: Diagnosis not present

## 2013-08-21 DIAGNOSIS — M726 Necrotizing fasciitis: Secondary | ICD-10-CM | POA: Diagnosis not present

## 2013-08-21 DIAGNOSIS — I10 Essential (primary) hypertension: Secondary | ICD-10-CM | POA: Diagnosis not present

## 2013-08-21 DIAGNOSIS — I5032 Chronic diastolic (congestive) heart failure: Secondary | ICD-10-CM | POA: Diagnosis not present

## 2013-08-21 DIAGNOSIS — IMO0001 Reserved for inherently not codable concepts without codable children: Secondary | ICD-10-CM | POA: Diagnosis not present

## 2013-08-21 DIAGNOSIS — M109 Gout, unspecified: Secondary | ICD-10-CM | POA: Diagnosis not present

## 2013-08-21 DIAGNOSIS — I509 Heart failure, unspecified: Secondary | ICD-10-CM | POA: Diagnosis not present

## 2013-08-21 DIAGNOSIS — Z794 Long term (current) use of insulin: Secondary | ICD-10-CM | POA: Diagnosis not present

## 2013-08-21 DIAGNOSIS — I214 Non-ST elevation (NSTEMI) myocardial infarction: Secondary | ICD-10-CM | POA: Diagnosis not present

## 2013-08-21 DIAGNOSIS — E756 Lipid storage disorder, unspecified: Secondary | ICD-10-CM | POA: Diagnosis not present

## 2013-08-22 ENCOUNTER — Encounter: Payer: Medicare Other | Admitting: *Deleted

## 2013-08-22 DIAGNOSIS — E109 Type 1 diabetes mellitus without complications: Secondary | ICD-10-CM | POA: Diagnosis not present

## 2013-08-27 ENCOUNTER — Encounter: Payer: Self-pay | Admitting: *Deleted

## 2013-08-31 ENCOUNTER — Encounter (HOSPITAL_COMMUNITY)
Admission: RE | Admit: 2013-08-31 | Discharge: 2013-08-31 | Disposition: A | Discharge status: Home / Self Care | Payer: Medicare Other | Source: Ambulatory Visit | Attending: Nephrology | Admitting: Nephrology

## 2013-08-31 ENCOUNTER — Encounter (HOSPITAL_COMMUNITY): Payer: Self-pay

## 2013-08-31 DIAGNOSIS — E756 Lipid storage disorder, unspecified: Secondary | ICD-10-CM | POA: Diagnosis not present

## 2013-08-31 LAB — PHOSPHORUS: Phosphorus: 4.1 mg/dL (ref 2.3–4.6)

## 2013-08-31 LAB — COMPREHENSIVE METABOLIC PANEL
ALT: 22 U/L (ref 0–53)
AST: 21 U/L (ref 0–37)
Albumin: 2.6 g/dL — ABNORMAL LOW (ref 3.5–5.2)
Alkaline Phosphatase: 102 U/L (ref 39–117)
BUN: 30 mg/dL — ABNORMAL HIGH (ref 6–23)
CO2: 22 mEq/L (ref 19–32)
Calcium: 9.3 mg/dL (ref 8.4–10.5)
Chloride: 103 mEq/L (ref 96–112)
Creatinine, Ser: 1.52 mg/dL — ABNORMAL HIGH (ref 0.50–1.35)
GFR calc Af Amer: 59 mL/min — ABNORMAL LOW (ref >90)
GFR calc non Af Amer: 51 mL/min — ABNORMAL LOW (ref >90)
Glucose, Bld: 155 mg/dL — ABNORMAL HIGH (ref 70–99)
Potassium: 4.5 mEq/L (ref 3.7–5.3)
Sodium: 138 mEq/L (ref 137–147)
Total Bilirubin: 0.4 mg/dL (ref 0.3–1.2)
Total Protein: 8 g/dL (ref 6.0–8.3)

## 2013-08-31 LAB — URINALYSIS, ROUTINE W REFLEX MICROSCOPIC
Bilirubin Urine: NEGATIVE
Glucose, UA: NEGATIVE mg/dL
Hgb urine dipstick: NEGATIVE
Ketones, ur: NEGATIVE mg/dL
Leukocytes, UA: NEGATIVE
Nitrite: NEGATIVE
Protein, ur: NEGATIVE mg/dL
Specific Gravity, Urine: 1.008 (ref 1.005–1.030)
Urobilinogen, UA: 0.2 mg/dL (ref 0.0–1.0)
pH: 5.5 (ref 5.0–8.0)

## 2013-08-31 MED ORDER — SODIUM CHLORIDE 0.9 % IV SOLN
115.0000 mg | INTRAVENOUS | Status: DC
  Administered 2013-08-31: 115 mg via INTRAVENOUS
  Filled 2013-08-31: qty 23

## 2013-08-31 MED ORDER — SODIUM CHLORIDE 0.9 % IV SOLN
INTRAVENOUS | Status: DC
  Administered 2013-08-31: 13:00:00 via INTRAVENOUS

## 2013-08-31 MED ORDER — ACETAMINOPHEN 500 MG PO TABS: 500.0000 mg | ORAL_TABLET | ORAL | Status: DC

## 2013-08-31 NOTE — Discharge Instructions (Signed)
Agalsidase Beta injection What is this medicine? AGALSIDASE BETA is used to replace an enzyme that is missing in patients with Fabry disease. It is not a cure. This medicine may be used for other purposes; ask your health care provider or pharmacist if you have questions. COMMON BRAND NAME(S): Fabrazyme What should I tell my health care provider before I take this medicine? They need to know if you have any of these conditions: -heart disease -an unusual or allergic reaction to agalsidase beta, mannitol, other medicines, foods, dyes, or preservatives -pregnant or trying to get pregnant -breast-feeding How should I use this medicine? This medicine is for infusion into a vein. It is given by a health care professional in a hospital or clinic setting. Talk to your pediatrician regarding the use of this medicine in children. Special care may be needed. Overdosage: If you think you have taken too much of this medicine contact a poison control center or emergency room at once. NOTE: This medicine is only for you. Do not share this medicine with others. What if I miss a dose? It is important not to miss your dose. Call your doctor or health care professional if you are unable to keep an appointment. What may interact with this medicine? -amiodarone -chloroquine -gentamicin -hydroxychloroquine -monobenzone This list may not describe all possible interactions. Give your health care provider a list of all the medicines, herbs, non-prescription drugs, or dietary supplements you use. Also tell them if you smoke, drink alcohol, or use illegal drugs. Some items may interact with your medicine. What should I watch for while using this medicine? Visit your doctor or health care professional for regular checks on your progress. Tell your doctor or healthcare professional if your symptoms do not start to get better or if they get worse. There is a registry for patients with Fabry disease. The registry is  used to gather information about the disease and its effects. Talk to your health care provider if you would like to join the registry. What side effects may I notice from receiving this medicine? Side effects that you should report to your doctor or health care professional as soon as possible: -allergic reactions like skin rash, itching or hives, swelling of the face, lips, or tongue -breathing problems -chest pain, tightness -depression -dizziness -fast, irregular heart beat -swelling of the arms or legs Side effects that usually do not require medical attention (report to your doctor or health care professional if they continue or are bothersome): -aches or pains -anxiety -fever or chills at the time of injection -headache -nausea, vomiting -stomach pain, upset This list may not describe all possible side effects. Call your doctor for medical advice about side effects. You may report side effects to FDA at 1-800-FDA-1088. Where should I keep my medicine? This drug is given in a hospital or clinic and will not be stored at home. NOTE: This sheet is a summary. It may not cover all possible information. If you have questions about this medicine, talk to your doctor, pharmacist, or health care provider.  2014, Elsevier/Gold Standard. (2006-01-31 12:25:00)

## 2013-09-06 DIAGNOSIS — E756 Lipid storage disorder, unspecified: Secondary | ICD-10-CM | POA: Diagnosis not present

## 2013-09-07 DIAGNOSIS — M726 Necrotizing fasciitis: Secondary | ICD-10-CM | POA: Diagnosis not present

## 2013-09-12 DIAGNOSIS — M109 Gout, unspecified: Secondary | ICD-10-CM | POA: Diagnosis not present

## 2013-09-12 DIAGNOSIS — I890 Lymphedema, not elsewhere classified: Secondary | ICD-10-CM | POA: Diagnosis not present

## 2013-09-12 DIAGNOSIS — I129 Hypertensive chronic kidney disease with stage 1 through stage 4 chronic kidney disease, or unspecified chronic kidney disease: Secondary | ICD-10-CM | POA: Diagnosis not present

## 2013-09-12 DIAGNOSIS — N182 Chronic kidney disease, stage 2 (mild): Secondary | ICD-10-CM | POA: Diagnosis not present

## 2013-09-14 ENCOUNTER — Encounter (HOSPITAL_COMMUNITY)
Admission: RE | Admit: 2013-09-14 | Discharge: 2013-09-14 | Disposition: A | Discharge status: Home / Self Care | Payer: Medicare Other | Source: Ambulatory Visit | Attending: Nephrology | Admitting: Nephrology

## 2013-09-14 ENCOUNTER — Encounter (HOSPITAL_COMMUNITY): Payer: Self-pay

## 2013-09-14 DIAGNOSIS — E756 Lipid storage disorder, unspecified: Secondary | ICD-10-CM | POA: Diagnosis not present

## 2013-09-14 MED ORDER — SODIUM CHLORIDE 0.9 % IV SOLN
INTRAVENOUS | Status: DC
  Administered 2013-09-14: 08:00:00 via INTRAVENOUS

## 2013-09-14 MED ORDER — ACETAMINOPHEN 500 MG PO TABS: 500.0000 mg | ORAL_TABLET | ORAL | Status: DC

## 2013-09-14 MED ORDER — SODIUM CHLORIDE 0.9 % IV SOLN
115.0000 mg | INTRAVENOUS | Status: DC
  Administered 2013-09-14: 115 mg via INTRAVENOUS
  Filled 2013-09-14: qty 23

## 2013-09-14 NOTE — Discharge Instructions (Signed)
Agalsidase Beta injection What is this medicine? AGALSIDASE BETA is used to replace an enzyme that is missing in patients with Fabry disease. It is not a cure. This medicine may be used for other purposes; ask your health care provider or pharmacist if you have questions. COMMON BRAND NAME(S): Fabrazyme What should I tell my health care provider before I take this medicine? They need to know if you have any of these conditions: -heart disease -an unusual or allergic reaction to agalsidase beta, mannitol, other medicines, foods, dyes, or preservatives -pregnant or trying to get pregnant -breast-feeding How should I use this medicine? This medicine is for infusion into a vein. It is given by a health care professional in a hospital or clinic setting. Talk to your pediatrician regarding the use of this medicine in children. Special care may be needed. Overdosage: If you think you have taken too much of this medicine contact a poison control center or emergency room at once. NOTE: This medicine is only for you. Do not share this medicine with others. What if I miss a dose? It is important not to miss your dose. Call your doctor or health care professional if you are unable to keep an appointment. What may interact with this medicine? -amiodarone -chloroquine -gentamicin -hydroxychloroquine -monobenzone This list may not describe all possible interactions. Give your health care provider a list of all the medicines, herbs, non-prescription drugs, or dietary supplements you use. Also tell them if you smoke, drink alcohol, or use illegal drugs. Some items may interact with your medicine. What should I watch for while using this medicine? Visit your doctor or health care professional for regular checks on your progress. Tell your doctor or healthcare professional if your symptoms do not start to get better or if they get worse. There is a registry for patients with Fabry disease. The registry is  used to gather information about the disease and its effects. Talk to your health care provider if you would like to join the registry. What side effects may I notice from receiving this medicine? Side effects that you should report to your doctor or health care professional as soon as possible: -allergic reactions like skin rash, itching or hives, swelling of the face, lips, or tongue -breathing problems -chest pain, tightness -depression -dizziness -fast, irregular heart beat -swelling of the arms or legs Side effects that usually do not require medical attention (report to your doctor or health care professional if they continue or are bothersome): -aches or pains -anxiety -fever or chills at the time of injection -headache -nausea, vomiting -stomach pain, upset This list may not describe all possible side effects. Call your doctor for medical advice about side effects. You may report side effects to FDA at 1-800-FDA-1088. Where should I keep my medicine? This drug is given in a hospital or clinic and will not be stored at home. NOTE: This sheet is a summary. It may not cover all possible information. If you have questions about this medicine, talk to your doctor, pharmacist, or health care provider.  2014, Elsevier/Gold Standard. (2006-01-31 12:25:00)

## 2013-09-28 ENCOUNTER — Encounter (HOSPITAL_COMMUNITY): Payer: Self-pay

## 2013-09-28 ENCOUNTER — Encounter (HOSPITAL_COMMUNITY)
Admission: RE | Admit: 2013-09-28 | Discharge: 2013-09-28 | Disposition: A | Discharge status: Home / Self Care | Payer: Medicare Other | Source: Ambulatory Visit | Attending: Nephrology | Admitting: Nephrology

## 2013-09-28 DIAGNOSIS — E756 Lipid storage disorder, unspecified: Secondary | ICD-10-CM | POA: Diagnosis not present

## 2013-09-28 LAB — URINALYSIS, ROUTINE W REFLEX MICROSCOPIC
Bilirubin Urine: NEGATIVE
Glucose, UA: NEGATIVE mg/dL
Hgb urine dipstick: NEGATIVE
Ketones, ur: NEGATIVE mg/dL
Leukocytes, UA: NEGATIVE
Nitrite: NEGATIVE
Protein, ur: NEGATIVE mg/dL
Specific Gravity, Urine: 1.007 (ref 1.005–1.030)
Urobilinogen, UA: 0.2 mg/dL (ref 0.0–1.0)
pH: 5.5 (ref 5.0–8.0)

## 2013-09-28 LAB — PHOSPHORUS: Phosphorus: 4.8 mg/dL — ABNORMAL HIGH (ref 2.3–4.6)

## 2013-09-28 LAB — COMPREHENSIVE METABOLIC PANEL
ALT: 19 U/L (ref 0–53)
AST: 29 U/L (ref 0–37)
Albumin: 2.6 g/dL — ABNORMAL LOW (ref 3.5–5.2)
Alkaline Phosphatase: 65 U/L (ref 39–117)
BUN: 20 mg/dL (ref 6–23)
CO2: 25 mEq/L (ref 19–32)
Calcium: 9 mg/dL (ref 8.4–10.5)
Chloride: 101 mEq/L (ref 96–112)
Creatinine, Ser: 1.08 mg/dL (ref 0.50–1.35)
GFR calc Af Amer: 89 mL/min — ABNORMAL LOW (ref >90)
GFR calc non Af Amer: 77 mL/min — ABNORMAL LOW (ref >90)
Glucose, Bld: 111 mg/dL — ABNORMAL HIGH (ref 70–99)
Potassium: 4.8 mEq/L (ref 3.7–5.3)
Sodium: 140 mEq/L (ref 137–147)
Total Bilirubin: 0.2 mg/dL — ABNORMAL LOW (ref 0.3–1.2)
Total Protein: 7.5 g/dL (ref 6.0–8.3)

## 2013-09-28 MED ORDER — ACETAMINOPHEN 500 MG PO TABS
500.0000 mg | ORAL_TABLET | ORAL | Status: DC
  Filled 2013-09-28: qty 1

## 2013-09-28 MED ORDER — SODIUM CHLORIDE 0.9 % IV SOLN
115.0000 mg | INTRAVENOUS | Status: DC
  Administered 2013-09-28: 115 mg via INTRAVENOUS
  Filled 2013-09-28: qty 23

## 2013-09-28 MED ORDER — SODIUM CHLORIDE 0.9 % IV SOLN
INTRAVENOUS | Status: DC
  Administered 2013-09-28: 08:00:00 via INTRAVENOUS

## 2013-10-05 DIAGNOSIS — M726 Necrotizing fasciitis: Secondary | ICD-10-CM | POA: Diagnosis not present

## 2013-10-12 ENCOUNTER — Encounter (HOSPITAL_COMMUNITY)
Admission: RE | Admit: 2013-10-12 | Discharge: 2013-10-12 | Disposition: A | Discharge status: Home / Self Care | Payer: Medicare Other | Source: Ambulatory Visit | Attending: Nephrology | Admitting: Nephrology

## 2013-10-12 ENCOUNTER — Encounter (HOSPITAL_COMMUNITY): Payer: Self-pay

## 2013-10-12 DIAGNOSIS — E756 Lipid storage disorder, unspecified: Secondary | ICD-10-CM | POA: Insufficient documentation

## 2013-10-12 MED ORDER — SODIUM CHLORIDE 0.9 % IV SOLN
115.0000 mg | INTRAVENOUS | Status: DC
  Administered 2013-10-12: 115 mg via INTRAVENOUS
  Filled 2013-10-12: qty 23

## 2013-10-12 MED ORDER — SODIUM CHLORIDE 0.9 % IV SOLN
INTRAVENOUS | Status: DC
  Administered 2013-10-12: 250 mL via INTRAVENOUS

## 2013-10-12 MED ORDER — ACETAMINOPHEN 500 MG PO TABS: 500.0000 mg | ORAL_TABLET | ORAL | Status: DC

## 2013-10-20 DIAGNOSIS — M109 Gout, unspecified: Secondary | ICD-10-CM | POA: Diagnosis not present

## 2013-10-20 DIAGNOSIS — I5032 Chronic diastolic (congestive) heart failure: Secondary | ICD-10-CM | POA: Diagnosis not present

## 2013-10-20 DIAGNOSIS — E756 Lipid storage disorder, unspecified: Secondary | ICD-10-CM | POA: Diagnosis not present

## 2013-10-20 DIAGNOSIS — M726 Necrotizing fasciitis: Secondary | ICD-10-CM | POA: Diagnosis not present

## 2013-10-20 DIAGNOSIS — I214 Non-ST elevation (NSTEMI) myocardial infarction: Secondary | ICD-10-CM | POA: Diagnosis not present

## 2013-10-20 DIAGNOSIS — I509 Heart failure, unspecified: Secondary | ICD-10-CM | POA: Diagnosis not present

## 2013-10-20 DIAGNOSIS — I129 Hypertensive chronic kidney disease with stage 1 through stage 4 chronic kidney disease, or unspecified chronic kidney disease: Secondary | ICD-10-CM | POA: Diagnosis not present

## 2013-10-20 DIAGNOSIS — Z794 Long term (current) use of insulin: Secondary | ICD-10-CM | POA: Diagnosis not present

## 2013-10-20 DIAGNOSIS — IMO0001 Reserved for inherently not codable concepts without codable children: Secondary | ICD-10-CM | POA: Diagnosis not present

## 2013-10-20 DIAGNOSIS — N189 Chronic kidney disease, unspecified: Secondary | ICD-10-CM | POA: Diagnosis not present

## 2013-10-22 DIAGNOSIS — IMO0001 Reserved for inherently not codable concepts without codable children: Secondary | ICD-10-CM | POA: Diagnosis not present

## 2013-10-22 DIAGNOSIS — I129 Hypertensive chronic kidney disease with stage 1 through stage 4 chronic kidney disease, or unspecified chronic kidney disease: Secondary | ICD-10-CM | POA: Diagnosis not present

## 2013-10-22 DIAGNOSIS — I214 Non-ST elevation (NSTEMI) myocardial infarction: Secondary | ICD-10-CM | POA: Diagnosis not present

## 2013-10-22 DIAGNOSIS — M726 Necrotizing fasciitis: Secondary | ICD-10-CM | POA: Diagnosis not present

## 2013-10-22 DIAGNOSIS — N189 Chronic kidney disease, unspecified: Secondary | ICD-10-CM | POA: Diagnosis not present

## 2013-10-22 DIAGNOSIS — I5032 Chronic diastolic (congestive) heart failure: Secondary | ICD-10-CM | POA: Diagnosis not present

## 2013-10-24 DIAGNOSIS — I214 Non-ST elevation (NSTEMI) myocardial infarction: Secondary | ICD-10-CM | POA: Diagnosis not present

## 2013-10-24 DIAGNOSIS — IMO0001 Reserved for inherently not codable concepts without codable children: Secondary | ICD-10-CM | POA: Diagnosis not present

## 2013-10-24 DIAGNOSIS — I129 Hypertensive chronic kidney disease with stage 1 through stage 4 chronic kidney disease, or unspecified chronic kidney disease: Secondary | ICD-10-CM | POA: Diagnosis not present

## 2013-10-24 DIAGNOSIS — I5032 Chronic diastolic (congestive) heart failure: Secondary | ICD-10-CM | POA: Diagnosis not present

## 2013-10-24 DIAGNOSIS — N189 Chronic kidney disease, unspecified: Secondary | ICD-10-CM | POA: Diagnosis not present

## 2013-10-24 DIAGNOSIS — M726 Necrotizing fasciitis: Secondary | ICD-10-CM | POA: Diagnosis not present

## 2013-10-26 ENCOUNTER — Encounter (HOSPITAL_COMMUNITY)
Admission: RE | Admit: 2013-10-26 | Discharge: 2013-10-26 | Disposition: A | Discharge status: Home / Self Care | Payer: Medicare Other | Source: Ambulatory Visit | Attending: Nephrology | Admitting: Nephrology

## 2013-10-26 ENCOUNTER — Other Ambulatory Visit (HOSPITAL_COMMUNITY): Payer: Self-pay | Admitting: Nephrology

## 2013-10-26 DIAGNOSIS — E756 Lipid storage disorder, unspecified: Secondary | ICD-10-CM | POA: Diagnosis not present

## 2013-10-26 DIAGNOSIS — I5032 Chronic diastolic (congestive) heart failure: Secondary | ICD-10-CM | POA: Diagnosis not present

## 2013-10-26 DIAGNOSIS — M726 Necrotizing fasciitis: Secondary | ICD-10-CM | POA: Diagnosis not present

## 2013-10-26 DIAGNOSIS — I214 Non-ST elevation (NSTEMI) myocardial infarction: Secondary | ICD-10-CM | POA: Diagnosis not present

## 2013-10-26 DIAGNOSIS — IMO0001 Reserved for inherently not codable concepts without codable children: Secondary | ICD-10-CM | POA: Diagnosis not present

## 2013-10-26 DIAGNOSIS — I129 Hypertensive chronic kidney disease with stage 1 through stage 4 chronic kidney disease, or unspecified chronic kidney disease: Secondary | ICD-10-CM | POA: Diagnosis not present

## 2013-10-26 DIAGNOSIS — N189 Chronic kidney disease, unspecified: Secondary | ICD-10-CM | POA: Diagnosis not present

## 2013-10-26 LAB — COMPREHENSIVE METABOLIC PANEL
ALT: 12 U/L (ref 0–53)
AST: 19 U/L (ref 0–37)
Albumin: 3 g/dL — ABNORMAL LOW (ref 3.5–5.2)
Alkaline Phosphatase: 65 U/L (ref 39–117)
BUN: 13 mg/dL (ref 6–23)
CO2: 24 mEq/L (ref 19–32)
Calcium: 9.5 mg/dL (ref 8.4–10.5)
Chloride: 104 mEq/L (ref 96–112)
Creatinine, Ser: 1.06 mg/dL (ref 0.50–1.35)
GFR calc Af Amer: >90 mL/min (ref >90)
GFR calc non Af Amer: 79 mL/min — ABNORMAL LOW (ref >90)
Glucose, Bld: 113 mg/dL — ABNORMAL HIGH (ref 70–99)
Potassium: 3.9 mEq/L (ref 3.7–5.3)
Sodium: 143 mEq/L (ref 137–147)
Total Bilirubin: 0.4 mg/dL (ref 0.3–1.2)
Total Protein: 7.6 g/dL (ref 6.0–8.3)

## 2013-10-26 LAB — CBC WITH DIFFERENTIAL/PLATELET
Basophils Absolute: 0 10*3/uL (ref 0.0–0.1)
Basophils Relative: 0 % (ref 0–1)
Eosinophils Absolute: 0.4 10*3/uL (ref 0.0–0.7)
Eosinophils Relative: 6 % — ABNORMAL HIGH (ref 0–5)
HCT: 39.1 % (ref 39.0–52.0)
Hemoglobin: 12.5 g/dL — ABNORMAL LOW (ref 13.0–17.0)
Lymphocytes Relative: 41 % (ref 12–46)
Lymphs Abs: 2.7 10*3/uL (ref 0.7–4.0)
MCH: 28 pg (ref 26.0–34.0)
MCHC: 32 g/dL (ref 30.0–36.0)
MCV: 87.5 fL (ref 78.0–100.0)
Monocytes Absolute: 0.6 10*3/uL (ref 0.1–1.0)
Monocytes Relative: 9 % (ref 3–12)
Neutro Abs: 2.8 10*3/uL (ref 1.7–7.7)
Neutrophils Relative %: 44 % (ref 43–77)
Platelets: 234 10*3/uL (ref 150–400)
RBC: 4.47 MIL/uL (ref 4.22–5.81)
RDW: 13.7 % (ref 11.5–15.5)
WBC: 6.4 10*3/uL (ref 4.0–10.5)

## 2013-10-26 LAB — IRON AND TIBC
Iron: 44 ug/dL (ref 42–135)
Saturation Ratios: 17 % — ABNORMAL LOW (ref 20–55)
TIBC: 259 ug/dL (ref 215–435)
UIBC: 215 ug/dL (ref 125–400)

## 2013-10-26 LAB — URINALYSIS, ROUTINE W REFLEX MICROSCOPIC
Bilirubin Urine: NEGATIVE
Glucose, UA: NEGATIVE mg/dL
Hgb urine dipstick: NEGATIVE
Ketones, ur: NEGATIVE mg/dL
Leukocytes, UA: NEGATIVE
Nitrite: NEGATIVE
Protein, ur: NEGATIVE mg/dL
Specific Gravity, Urine: 1.008 (ref 1.005–1.030)
Urobilinogen, UA: 0.2 mg/dL (ref 0.0–1.0)
pH: 5.5 (ref 5.0–8.0)

## 2013-10-26 LAB — LIPID PANEL
Cholesterol: 186 mg/dL (ref 0–200)
HDL: 47 mg/dL (ref >39)
LDL Cholesterol: 120 mg/dL — ABNORMAL HIGH (ref 0–99)
Total CHOL/HDL Ratio: 4 RATIO
Triglycerides: 97 mg/dL (ref <150)
VLDL: 19 mg/dL (ref 0–40)

## 2013-10-26 LAB — FERRITIN: Ferritin: 153 ng/mL (ref 22–322)

## 2013-10-26 LAB — PHOSPHORUS: Phosphorus: 4.4 mg/dL (ref 2.3–4.6)

## 2013-10-26 MED ORDER — ACETAMINOPHEN 500 MG PO TABS: 500.0000 mg | ORAL_TABLET | ORAL | Status: DC

## 2013-10-26 MED ORDER — SODIUM CHLORIDE 0.9 % IV SOLN
INTRAVENOUS | Status: AC
  Administered 2013-10-26: 250 mL via INTRAVENOUS

## 2013-10-26 MED ORDER — SODIUM CHLORIDE 0.9 % IV SOLN
115.0000 mg | INTRAVENOUS | Status: AC
  Administered 2013-10-26: 115 mg via INTRAVENOUS
  Filled 2013-10-26: qty 23

## 2013-10-29 DIAGNOSIS — IMO0001 Reserved for inherently not codable concepts without codable children: Secondary | ICD-10-CM | POA: Diagnosis not present

## 2013-10-29 DIAGNOSIS — M726 Necrotizing fasciitis: Secondary | ICD-10-CM | POA: Diagnosis not present

## 2013-10-29 DIAGNOSIS — I5032 Chronic diastolic (congestive) heart failure: Secondary | ICD-10-CM | POA: Diagnosis not present

## 2013-10-29 DIAGNOSIS — N189 Chronic kidney disease, unspecified: Secondary | ICD-10-CM | POA: Diagnosis not present

## 2013-10-29 DIAGNOSIS — I214 Non-ST elevation (NSTEMI) myocardial infarction: Secondary | ICD-10-CM | POA: Diagnosis not present

## 2013-10-29 DIAGNOSIS — I129 Hypertensive chronic kidney disease with stage 1 through stage 4 chronic kidney disease, or unspecified chronic kidney disease: Secondary | ICD-10-CM | POA: Diagnosis not present

## 2013-10-31 DIAGNOSIS — I129 Hypertensive chronic kidney disease with stage 1 through stage 4 chronic kidney disease, or unspecified chronic kidney disease: Secondary | ICD-10-CM | POA: Diagnosis not present

## 2013-10-31 DIAGNOSIS — I5032 Chronic diastolic (congestive) heart failure: Secondary | ICD-10-CM | POA: Diagnosis not present

## 2013-10-31 DIAGNOSIS — IMO0001 Reserved for inherently not codable concepts without codable children: Secondary | ICD-10-CM | POA: Diagnosis not present

## 2013-10-31 DIAGNOSIS — I214 Non-ST elevation (NSTEMI) myocardial infarction: Secondary | ICD-10-CM | POA: Diagnosis not present

## 2013-10-31 DIAGNOSIS — M726 Necrotizing fasciitis: Secondary | ICD-10-CM | POA: Diagnosis not present

## 2013-10-31 DIAGNOSIS — N189 Chronic kidney disease, unspecified: Secondary | ICD-10-CM | POA: Diagnosis not present

## 2013-11-02 DIAGNOSIS — M726 Necrotizing fasciitis: Secondary | ICD-10-CM | POA: Diagnosis not present

## 2013-11-05 DIAGNOSIS — I5032 Chronic diastolic (congestive) heart failure: Secondary | ICD-10-CM | POA: Diagnosis not present

## 2013-11-05 DIAGNOSIS — I214 Non-ST elevation (NSTEMI) myocardial infarction: Secondary | ICD-10-CM | POA: Diagnosis not present

## 2013-11-05 DIAGNOSIS — N189 Chronic kidney disease, unspecified: Secondary | ICD-10-CM | POA: Diagnosis not present

## 2013-11-05 DIAGNOSIS — I129 Hypertensive chronic kidney disease with stage 1 through stage 4 chronic kidney disease, or unspecified chronic kidney disease: Secondary | ICD-10-CM | POA: Diagnosis not present

## 2013-11-05 DIAGNOSIS — M726 Necrotizing fasciitis: Secondary | ICD-10-CM | POA: Diagnosis not present

## 2013-11-05 DIAGNOSIS — IMO0001 Reserved for inherently not codable concepts without codable children: Secondary | ICD-10-CM | POA: Diagnosis not present

## 2013-11-06 ENCOUNTER — Encounter: Payer: Self-pay | Admitting: Gastroenterology

## 2013-11-07 ENCOUNTER — Encounter: Payer: Self-pay | Admitting: Cardiology

## 2013-11-07 DIAGNOSIS — IMO0001 Reserved for inherently not codable concepts without codable children: Secondary | ICD-10-CM | POA: Diagnosis not present

## 2013-11-07 DIAGNOSIS — I214 Non-ST elevation (NSTEMI) myocardial infarction: Secondary | ICD-10-CM | POA: Diagnosis not present

## 2013-11-07 DIAGNOSIS — N189 Chronic kidney disease, unspecified: Secondary | ICD-10-CM | POA: Diagnosis not present

## 2013-11-07 DIAGNOSIS — I129 Hypertensive chronic kidney disease with stage 1 through stage 4 chronic kidney disease, or unspecified chronic kidney disease: Secondary | ICD-10-CM | POA: Diagnosis not present

## 2013-11-07 DIAGNOSIS — M726 Necrotizing fasciitis: Secondary | ICD-10-CM | POA: Diagnosis not present

## 2013-11-07 DIAGNOSIS — I5032 Chronic diastolic (congestive) heart failure: Secondary | ICD-10-CM | POA: Diagnosis not present

## 2013-11-08 DIAGNOSIS — I129 Hypertensive chronic kidney disease with stage 1 through stage 4 chronic kidney disease, or unspecified chronic kidney disease: Secondary | ICD-10-CM | POA: Diagnosis not present

## 2013-11-08 DIAGNOSIS — IMO0001 Reserved for inherently not codable concepts without codable children: Secondary | ICD-10-CM | POA: Diagnosis not present

## 2013-11-08 DIAGNOSIS — I5032 Chronic diastolic (congestive) heart failure: Secondary | ICD-10-CM | POA: Diagnosis not present

## 2013-11-08 DIAGNOSIS — N189 Chronic kidney disease, unspecified: Secondary | ICD-10-CM | POA: Diagnosis not present

## 2013-11-08 DIAGNOSIS — I214 Non-ST elevation (NSTEMI) myocardial infarction: Secondary | ICD-10-CM | POA: Diagnosis not present

## 2013-11-08 DIAGNOSIS — M726 Necrotizing fasciitis: Secondary | ICD-10-CM | POA: Diagnosis not present

## 2013-11-09 ENCOUNTER — Encounter (HOSPITAL_COMMUNITY)
Admission: RE | Admit: 2013-11-09 | Discharge: 2013-11-09 | Disposition: A | Discharge status: Home / Self Care | Payer: Medicare Other | Source: Ambulatory Visit | Attending: Nephrology | Admitting: Nephrology

## 2013-11-09 ENCOUNTER — Other Ambulatory Visit (HOSPITAL_COMMUNITY): Payer: Self-pay | Admitting: Nephrology

## 2013-11-09 DIAGNOSIS — M726 Necrotizing fasciitis: Secondary | ICD-10-CM | POA: Diagnosis not present

## 2013-11-09 DIAGNOSIS — I129 Hypertensive chronic kidney disease with stage 1 through stage 4 chronic kidney disease, or unspecified chronic kidney disease: Secondary | ICD-10-CM | POA: Diagnosis not present

## 2013-11-09 DIAGNOSIS — IMO0001 Reserved for inherently not codable concepts without codable children: Secondary | ICD-10-CM | POA: Diagnosis not present

## 2013-11-09 DIAGNOSIS — E756 Lipid storage disorder, unspecified: Secondary | ICD-10-CM | POA: Insufficient documentation

## 2013-11-09 DIAGNOSIS — I5032 Chronic diastolic (congestive) heart failure: Secondary | ICD-10-CM | POA: Diagnosis not present

## 2013-11-09 DIAGNOSIS — I214 Non-ST elevation (NSTEMI) myocardial infarction: Secondary | ICD-10-CM | POA: Diagnosis not present

## 2013-11-09 DIAGNOSIS — N189 Chronic kidney disease, unspecified: Secondary | ICD-10-CM | POA: Diagnosis not present

## 2013-11-09 MED ORDER — SODIUM CHLORIDE 0.9 % IV SOLN
1.0000 mg/kg | INTRAVENOUS | Status: DC
  Administered 2013-11-09: 115 mg via INTRAVENOUS
  Filled 2013-11-09: qty 23

## 2013-11-09 MED ORDER — SODIUM CHLORIDE 0.9 % IV SOLN
Freq: Once | INTRAVENOUS | Status: AC
  Administered 2013-11-09: 08:00:00 via INTRAVENOUS

## 2013-11-12 DIAGNOSIS — I5032 Chronic diastolic (congestive) heart failure: Secondary | ICD-10-CM | POA: Diagnosis not present

## 2013-11-12 DIAGNOSIS — I129 Hypertensive chronic kidney disease with stage 1 through stage 4 chronic kidney disease, or unspecified chronic kidney disease: Secondary | ICD-10-CM | POA: Diagnosis not present

## 2013-11-12 DIAGNOSIS — N189 Chronic kidney disease, unspecified: Secondary | ICD-10-CM | POA: Diagnosis not present

## 2013-11-12 DIAGNOSIS — I214 Non-ST elevation (NSTEMI) myocardial infarction: Secondary | ICD-10-CM | POA: Diagnosis not present

## 2013-11-12 DIAGNOSIS — IMO0001 Reserved for inherently not codable concepts without codable children: Secondary | ICD-10-CM | POA: Diagnosis not present

## 2013-11-12 DIAGNOSIS — M726 Necrotizing fasciitis: Secondary | ICD-10-CM | POA: Diagnosis not present

## 2013-11-14 DIAGNOSIS — N189 Chronic kidney disease, unspecified: Secondary | ICD-10-CM | POA: Diagnosis not present

## 2013-11-14 DIAGNOSIS — I5032 Chronic diastolic (congestive) heart failure: Secondary | ICD-10-CM | POA: Diagnosis not present

## 2013-11-14 DIAGNOSIS — I214 Non-ST elevation (NSTEMI) myocardial infarction: Secondary | ICD-10-CM | POA: Diagnosis not present

## 2013-11-14 DIAGNOSIS — IMO0001 Reserved for inherently not codable concepts without codable children: Secondary | ICD-10-CM | POA: Diagnosis not present

## 2013-11-14 DIAGNOSIS — M726 Necrotizing fasciitis: Secondary | ICD-10-CM | POA: Diagnosis not present

## 2013-11-14 DIAGNOSIS — I129 Hypertensive chronic kidney disease with stage 1 through stage 4 chronic kidney disease, or unspecified chronic kidney disease: Secondary | ICD-10-CM | POA: Diagnosis not present

## 2013-11-15 DIAGNOSIS — Z125 Encounter for screening for malignant neoplasm of prostate: Secondary | ICD-10-CM | POA: Diagnosis not present

## 2013-11-15 DIAGNOSIS — E119 Type 2 diabetes mellitus without complications: Secondary | ICD-10-CM | POA: Diagnosis not present

## 2013-11-15 DIAGNOSIS — I10 Essential (primary) hypertension: Secondary | ICD-10-CM | POA: Diagnosis not present

## 2013-11-16 DIAGNOSIS — I129 Hypertensive chronic kidney disease with stage 1 through stage 4 chronic kidney disease, or unspecified chronic kidney disease: Secondary | ICD-10-CM | POA: Diagnosis not present

## 2013-11-16 DIAGNOSIS — I214 Non-ST elevation (NSTEMI) myocardial infarction: Secondary | ICD-10-CM | POA: Diagnosis not present

## 2013-11-16 DIAGNOSIS — M726 Necrotizing fasciitis: Secondary | ICD-10-CM | POA: Diagnosis not present

## 2013-11-16 DIAGNOSIS — I5032 Chronic diastolic (congestive) heart failure: Secondary | ICD-10-CM | POA: Diagnosis not present

## 2013-11-16 DIAGNOSIS — IMO0001 Reserved for inherently not codable concepts without codable children: Secondary | ICD-10-CM | POA: Diagnosis not present

## 2013-11-16 DIAGNOSIS — N189 Chronic kidney disease, unspecified: Secondary | ICD-10-CM | POA: Diagnosis not present

## 2013-11-19 DIAGNOSIS — I5032 Chronic diastolic (congestive) heart failure: Secondary | ICD-10-CM | POA: Diagnosis not present

## 2013-11-19 DIAGNOSIS — M726 Necrotizing fasciitis: Secondary | ICD-10-CM | POA: Diagnosis not present

## 2013-11-19 DIAGNOSIS — I129 Hypertensive chronic kidney disease with stage 1 through stage 4 chronic kidney disease, or unspecified chronic kidney disease: Secondary | ICD-10-CM | POA: Diagnosis not present

## 2013-11-19 DIAGNOSIS — N189 Chronic kidney disease, unspecified: Secondary | ICD-10-CM | POA: Diagnosis not present

## 2013-11-19 DIAGNOSIS — IMO0001 Reserved for inherently not codable concepts without codable children: Secondary | ICD-10-CM | POA: Diagnosis not present

## 2013-11-19 DIAGNOSIS — I214 Non-ST elevation (NSTEMI) myocardial infarction: Secondary | ICD-10-CM | POA: Diagnosis not present

## 2013-11-19 DIAGNOSIS — E756 Lipid storage disorder, unspecified: Secondary | ICD-10-CM | POA: Diagnosis not present

## 2013-11-21 DIAGNOSIS — IMO0001 Reserved for inherently not codable concepts without codable children: Secondary | ICD-10-CM | POA: Diagnosis not present

## 2013-11-21 DIAGNOSIS — I129 Hypertensive chronic kidney disease with stage 1 through stage 4 chronic kidney disease, or unspecified chronic kidney disease: Secondary | ICD-10-CM | POA: Diagnosis not present

## 2013-11-21 DIAGNOSIS — M726 Necrotizing fasciitis: Secondary | ICD-10-CM | POA: Diagnosis not present

## 2013-11-21 DIAGNOSIS — N189 Chronic kidney disease, unspecified: Secondary | ICD-10-CM | POA: Diagnosis not present

## 2013-11-21 DIAGNOSIS — I5032 Chronic diastolic (congestive) heart failure: Secondary | ICD-10-CM | POA: Diagnosis not present

## 2013-11-21 DIAGNOSIS — I214 Non-ST elevation (NSTEMI) myocardial infarction: Secondary | ICD-10-CM | POA: Diagnosis not present

## 2013-11-23 ENCOUNTER — Encounter (HOSPITAL_COMMUNITY): Payer: Self-pay

## 2013-11-23 ENCOUNTER — Other Ambulatory Visit: Payer: Self-pay | Admitting: Cardiology

## 2013-11-23 ENCOUNTER — Encounter (HOSPITAL_COMMUNITY)
Admission: RE | Admit: 2013-11-23 | Discharge: 2013-11-23 | Disposition: A | Discharge status: Home / Self Care | Payer: Medicare Other | Source: Ambulatory Visit | Attending: Nephrology | Admitting: Nephrology

## 2013-11-23 ENCOUNTER — Other Ambulatory Visit (HOSPITAL_COMMUNITY): Payer: Self-pay | Admitting: Nephrology

## 2013-11-23 DIAGNOSIS — E756 Lipid storage disorder, unspecified: Secondary | ICD-10-CM | POA: Diagnosis not present

## 2013-11-23 DIAGNOSIS — N189 Chronic kidney disease, unspecified: Secondary | ICD-10-CM | POA: Diagnosis not present

## 2013-11-23 DIAGNOSIS — I214 Non-ST elevation (NSTEMI) myocardial infarction: Secondary | ICD-10-CM | POA: Diagnosis not present

## 2013-11-23 DIAGNOSIS — M726 Necrotizing fasciitis: Secondary | ICD-10-CM | POA: Diagnosis not present

## 2013-11-23 DIAGNOSIS — IMO0001 Reserved for inherently not codable concepts without codable children: Secondary | ICD-10-CM | POA: Diagnosis not present

## 2013-11-23 DIAGNOSIS — I5032 Chronic diastolic (congestive) heart failure: Secondary | ICD-10-CM | POA: Diagnosis not present

## 2013-11-23 DIAGNOSIS — I129 Hypertensive chronic kidney disease with stage 1 through stage 4 chronic kidney disease, or unspecified chronic kidney disease: Secondary | ICD-10-CM | POA: Diagnosis not present

## 2013-11-23 LAB — COMPREHENSIVE METABOLIC PANEL
ALT: 15 U/L (ref 0–53)
AST: 22 U/L (ref 0–37)
Albumin: 3.2 g/dL — ABNORMAL LOW (ref 3.5–5.2)
Alkaline Phosphatase: 67 U/L (ref 39–117)
BUN: 19 mg/dL (ref 6–23)
CO2: 21 mEq/L (ref 19–32)
Calcium: 9.1 mg/dL (ref 8.4–10.5)
Chloride: 103 mEq/L (ref 96–112)
Creatinine, Ser: 1.33 mg/dL (ref 0.50–1.35)
GFR calc Af Amer: 70 mL/min — ABNORMAL LOW (ref >90)
GFR calc non Af Amer: 60 mL/min — ABNORMAL LOW (ref >90)
Glucose, Bld: 118 mg/dL — ABNORMAL HIGH (ref 70–99)
Potassium: 4.2 mEq/L (ref 3.7–5.3)
Sodium: 139 mEq/L (ref 137–147)
Total Bilirubin: <0.2 mg/dL — ABNORMAL LOW (ref 0.3–1.2)
Total Protein: 7.6 g/dL (ref 6.0–8.3)

## 2013-11-23 LAB — PHOSPHORUS: Phosphorus: 4.1 mg/dL (ref 2.3–4.6)

## 2013-11-23 MED ORDER — SODIUM CHLORIDE 0.9 % IV SOLN
1.0000 mg/kg | INTRAVENOUS | Status: DC
  Administered 2013-11-23: 115 mg via INTRAVENOUS
  Filled 2013-11-23: qty 23

## 2013-11-23 MED ORDER — SODIUM CHLORIDE 0.9 % IV SOLN
Freq: Once | INTRAVENOUS | Status: AC
  Administered 2013-11-23: 250 mL via INTRAVENOUS

## 2013-11-23 NOTE — Progress Notes (Signed)
Pt here for FABRAZYME infusion. 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

## 2013-11-26 DIAGNOSIS — N189 Chronic kidney disease, unspecified: Secondary | ICD-10-CM | POA: Diagnosis not present

## 2013-11-26 DIAGNOSIS — M726 Necrotizing fasciitis: Secondary | ICD-10-CM | POA: Diagnosis not present

## 2013-11-26 DIAGNOSIS — I214 Non-ST elevation (NSTEMI) myocardial infarction: Secondary | ICD-10-CM | POA: Diagnosis not present

## 2013-11-26 DIAGNOSIS — IMO0001 Reserved for inherently not codable concepts without codable children: Secondary | ICD-10-CM | POA: Diagnosis not present

## 2013-11-26 DIAGNOSIS — I5032 Chronic diastolic (congestive) heart failure: Secondary | ICD-10-CM | POA: Diagnosis not present

## 2013-11-26 DIAGNOSIS — I129 Hypertensive chronic kidney disease with stage 1 through stage 4 chronic kidney disease, or unspecified chronic kidney disease: Secondary | ICD-10-CM | POA: Diagnosis not present

## 2013-11-28 DIAGNOSIS — I5032 Chronic diastolic (congestive) heart failure: Secondary | ICD-10-CM | POA: Diagnosis not present

## 2013-11-28 DIAGNOSIS — I129 Hypertensive chronic kidney disease with stage 1 through stage 4 chronic kidney disease, or unspecified chronic kidney disease: Secondary | ICD-10-CM | POA: Diagnosis not present

## 2013-11-28 DIAGNOSIS — I214 Non-ST elevation (NSTEMI) myocardial infarction: Secondary | ICD-10-CM | POA: Diagnosis not present

## 2013-11-28 DIAGNOSIS — M726 Necrotizing fasciitis: Secondary | ICD-10-CM | POA: Diagnosis not present

## 2013-11-28 DIAGNOSIS — IMO0001 Reserved for inherently not codable concepts without codable children: Secondary | ICD-10-CM | POA: Diagnosis not present

## 2013-11-28 DIAGNOSIS — N189 Chronic kidney disease, unspecified: Secondary | ICD-10-CM | POA: Diagnosis not present

## 2013-11-30 DIAGNOSIS — I5032 Chronic diastolic (congestive) heart failure: Secondary | ICD-10-CM | POA: Diagnosis not present

## 2013-11-30 DIAGNOSIS — I214 Non-ST elevation (NSTEMI) myocardial infarction: Secondary | ICD-10-CM | POA: Diagnosis not present

## 2013-11-30 DIAGNOSIS — N189 Chronic kidney disease, unspecified: Secondary | ICD-10-CM | POA: Diagnosis not present

## 2013-11-30 DIAGNOSIS — I129 Hypertensive chronic kidney disease with stage 1 through stage 4 chronic kidney disease, or unspecified chronic kidney disease: Secondary | ICD-10-CM | POA: Diagnosis not present

## 2013-11-30 DIAGNOSIS — IMO0001 Reserved for inherently not codable concepts without codable children: Secondary | ICD-10-CM | POA: Diagnosis not present

## 2013-11-30 DIAGNOSIS — M726 Necrotizing fasciitis: Secondary | ICD-10-CM | POA: Diagnosis not present

## 2013-12-03 DIAGNOSIS — I5032 Chronic diastolic (congestive) heart failure: Secondary | ICD-10-CM | POA: Diagnosis not present

## 2013-12-03 DIAGNOSIS — I129 Hypertensive chronic kidney disease with stage 1 through stage 4 chronic kidney disease, or unspecified chronic kidney disease: Secondary | ICD-10-CM | POA: Diagnosis not present

## 2013-12-03 DIAGNOSIS — N189 Chronic kidney disease, unspecified: Secondary | ICD-10-CM | POA: Diagnosis not present

## 2013-12-03 DIAGNOSIS — I214 Non-ST elevation (NSTEMI) myocardial infarction: Secondary | ICD-10-CM | POA: Diagnosis not present

## 2013-12-03 DIAGNOSIS — M726 Necrotizing fasciitis: Secondary | ICD-10-CM | POA: Diagnosis not present

## 2013-12-03 DIAGNOSIS — IMO0001 Reserved for inherently not codable concepts without codable children: Secondary | ICD-10-CM | POA: Diagnosis not present

## 2013-12-05 DIAGNOSIS — IMO0001 Reserved for inherently not codable concepts without codable children: Secondary | ICD-10-CM | POA: Diagnosis not present

## 2013-12-05 DIAGNOSIS — I129 Hypertensive chronic kidney disease with stage 1 through stage 4 chronic kidney disease, or unspecified chronic kidney disease: Secondary | ICD-10-CM | POA: Diagnosis not present

## 2013-12-05 DIAGNOSIS — M726 Necrotizing fasciitis: Secondary | ICD-10-CM | POA: Diagnosis not present

## 2013-12-05 DIAGNOSIS — N189 Chronic kidney disease, unspecified: Secondary | ICD-10-CM | POA: Diagnosis not present

## 2013-12-05 DIAGNOSIS — I214 Non-ST elevation (NSTEMI) myocardial infarction: Secondary | ICD-10-CM | POA: Diagnosis not present

## 2013-12-05 DIAGNOSIS — I5032 Chronic diastolic (congestive) heart failure: Secondary | ICD-10-CM | POA: Diagnosis not present

## 2013-12-07 ENCOUNTER — Encounter (HOSPITAL_COMMUNITY): Payer: Medicare Other

## 2013-12-07 DIAGNOSIS — M726 Necrotizing fasciitis: Secondary | ICD-10-CM | POA: Diagnosis not present

## 2013-12-07 DIAGNOSIS — I5032 Chronic diastolic (congestive) heart failure: Secondary | ICD-10-CM | POA: Diagnosis not present

## 2013-12-07 DIAGNOSIS — I129 Hypertensive chronic kidney disease with stage 1 through stage 4 chronic kidney disease, or unspecified chronic kidney disease: Secondary | ICD-10-CM | POA: Diagnosis not present

## 2013-12-07 DIAGNOSIS — I214 Non-ST elevation (NSTEMI) myocardial infarction: Secondary | ICD-10-CM | POA: Diagnosis not present

## 2013-12-07 DIAGNOSIS — IMO0001 Reserved for inherently not codable concepts without codable children: Secondary | ICD-10-CM | POA: Diagnosis not present

## 2013-12-07 DIAGNOSIS — N189 Chronic kidney disease, unspecified: Secondary | ICD-10-CM | POA: Diagnosis not present

## 2013-12-10 ENCOUNTER — Encounter (HOSPITAL_COMMUNITY)
Admission: RE | Admit: 2013-12-10 | Discharge: 2013-12-10 | Disposition: A | Discharge status: Home / Self Care | Payer: Medicare Other | Source: Ambulatory Visit | Attending: Nephrology | Admitting: Nephrology

## 2013-12-10 DIAGNOSIS — I214 Non-ST elevation (NSTEMI) myocardial infarction: Secondary | ICD-10-CM | POA: Diagnosis not present

## 2013-12-10 DIAGNOSIS — N189 Chronic kidney disease, unspecified: Secondary | ICD-10-CM | POA: Diagnosis not present

## 2013-12-10 DIAGNOSIS — E756 Lipid storage disorder, unspecified: Secondary | ICD-10-CM | POA: Insufficient documentation

## 2013-12-10 DIAGNOSIS — M726 Necrotizing fasciitis: Secondary | ICD-10-CM | POA: Diagnosis not present

## 2013-12-10 DIAGNOSIS — I5032 Chronic diastolic (congestive) heart failure: Secondary | ICD-10-CM | POA: Diagnosis not present

## 2013-12-10 DIAGNOSIS — I129 Hypertensive chronic kidney disease with stage 1 through stage 4 chronic kidney disease, or unspecified chronic kidney disease: Secondary | ICD-10-CM | POA: Diagnosis not present

## 2013-12-10 DIAGNOSIS — IMO0001 Reserved for inherently not codable concepts without codable children: Secondary | ICD-10-CM | POA: Diagnosis not present

## 2013-12-10 MED ORDER — SODIUM CHLORIDE 0.9 % IV SOLN
INTRAVENOUS | Status: DC
  Administered 2013-12-10: 08:00:00 via INTRAVENOUS

## 2013-12-10 MED ORDER — SODIUM CHLORIDE 0.9 % IV SOLN
1.0000 mg/kg | INTRAVENOUS | Status: DC
  Administered 2013-12-10: 115 mg via INTRAVENOUS
  Filled 2013-12-10: qty 23

## 2013-12-12 DIAGNOSIS — I129 Hypertensive chronic kidney disease with stage 1 through stage 4 chronic kidney disease, or unspecified chronic kidney disease: Secondary | ICD-10-CM | POA: Diagnosis not present

## 2013-12-12 DIAGNOSIS — N189 Chronic kidney disease, unspecified: Secondary | ICD-10-CM | POA: Diagnosis not present

## 2013-12-12 DIAGNOSIS — I5032 Chronic diastolic (congestive) heart failure: Secondary | ICD-10-CM | POA: Diagnosis not present

## 2013-12-12 DIAGNOSIS — I214 Non-ST elevation (NSTEMI) myocardial infarction: Secondary | ICD-10-CM | POA: Diagnosis not present

## 2013-12-12 DIAGNOSIS — IMO0001 Reserved for inherently not codable concepts without codable children: Secondary | ICD-10-CM | POA: Diagnosis not present

## 2013-12-12 DIAGNOSIS — M726 Necrotizing fasciitis: Secondary | ICD-10-CM | POA: Diagnosis not present

## 2013-12-14 DIAGNOSIS — I129 Hypertensive chronic kidney disease with stage 1 through stage 4 chronic kidney disease, or unspecified chronic kidney disease: Secondary | ICD-10-CM | POA: Diagnosis not present

## 2013-12-14 DIAGNOSIS — N189 Chronic kidney disease, unspecified: Secondary | ICD-10-CM | POA: Diagnosis not present

## 2013-12-14 DIAGNOSIS — I214 Non-ST elevation (NSTEMI) myocardial infarction: Secondary | ICD-10-CM | POA: Diagnosis not present

## 2013-12-14 DIAGNOSIS — IMO0001 Reserved for inherently not codable concepts without codable children: Secondary | ICD-10-CM | POA: Diagnosis not present

## 2013-12-14 DIAGNOSIS — M726 Necrotizing fasciitis: Secondary | ICD-10-CM | POA: Diagnosis not present

## 2013-12-14 DIAGNOSIS — I5032 Chronic diastolic (congestive) heart failure: Secondary | ICD-10-CM | POA: Diagnosis not present

## 2013-12-17 DIAGNOSIS — N189 Chronic kidney disease, unspecified: Secondary | ICD-10-CM | POA: Diagnosis not present

## 2013-12-17 DIAGNOSIS — M726 Necrotizing fasciitis: Secondary | ICD-10-CM | POA: Diagnosis not present

## 2013-12-17 DIAGNOSIS — I214 Non-ST elevation (NSTEMI) myocardial infarction: Secondary | ICD-10-CM | POA: Diagnosis not present

## 2013-12-17 DIAGNOSIS — IMO0001 Reserved for inherently not codable concepts without codable children: Secondary | ICD-10-CM | POA: Diagnosis not present

## 2013-12-17 DIAGNOSIS — I129 Hypertensive chronic kidney disease with stage 1 through stage 4 chronic kidney disease, or unspecified chronic kidney disease: Secondary | ICD-10-CM | POA: Diagnosis not present

## 2013-12-17 DIAGNOSIS — I5032 Chronic diastolic (congestive) heart failure: Secondary | ICD-10-CM | POA: Diagnosis not present

## 2013-12-19 DIAGNOSIS — I5032 Chronic diastolic (congestive) heart failure: Secondary | ICD-10-CM | POA: Diagnosis not present

## 2013-12-19 DIAGNOSIS — I129 Hypertensive chronic kidney disease with stage 1 through stage 4 chronic kidney disease, or unspecified chronic kidney disease: Secondary | ICD-10-CM | POA: Diagnosis not present

## 2013-12-19 DIAGNOSIS — M726 Necrotizing fasciitis: Secondary | ICD-10-CM | POA: Diagnosis not present

## 2013-12-19 DIAGNOSIS — IMO0001 Reserved for inherently not codable concepts without codable children: Secondary | ICD-10-CM | POA: Diagnosis not present

## 2013-12-19 DIAGNOSIS — Z794 Long term (current) use of insulin: Secondary | ICD-10-CM | POA: Diagnosis not present

## 2013-12-19 DIAGNOSIS — I509 Heart failure, unspecified: Secondary | ICD-10-CM | POA: Diagnosis not present

## 2013-12-19 DIAGNOSIS — N189 Chronic kidney disease, unspecified: Secondary | ICD-10-CM | POA: Diagnosis not present

## 2013-12-19 DIAGNOSIS — I252 Old myocardial infarction: Secondary | ICD-10-CM | POA: Diagnosis not present

## 2013-12-21 DIAGNOSIS — I129 Hypertensive chronic kidney disease with stage 1 through stage 4 chronic kidney disease, or unspecified chronic kidney disease: Secondary | ICD-10-CM | POA: Diagnosis not present

## 2013-12-21 DIAGNOSIS — M726 Necrotizing fasciitis: Secondary | ICD-10-CM | POA: Diagnosis not present

## 2013-12-21 DIAGNOSIS — IMO0001 Reserved for inherently not codable concepts without codable children: Secondary | ICD-10-CM | POA: Diagnosis not present

## 2013-12-21 DIAGNOSIS — I5032 Chronic diastolic (congestive) heart failure: Secondary | ICD-10-CM | POA: Diagnosis not present

## 2013-12-21 DIAGNOSIS — I509 Heart failure, unspecified: Secondary | ICD-10-CM | POA: Diagnosis not present

## 2013-12-21 DIAGNOSIS — N189 Chronic kidney disease, unspecified: Secondary | ICD-10-CM | POA: Diagnosis not present

## 2013-12-24 DIAGNOSIS — I509 Heart failure, unspecified: Secondary | ICD-10-CM | POA: Diagnosis not present

## 2013-12-24 DIAGNOSIS — M726 Necrotizing fasciitis: Secondary | ICD-10-CM | POA: Diagnosis not present

## 2013-12-24 DIAGNOSIS — I129 Hypertensive chronic kidney disease with stage 1 through stage 4 chronic kidney disease, or unspecified chronic kidney disease: Secondary | ICD-10-CM | POA: Diagnosis not present

## 2013-12-24 DIAGNOSIS — I5032 Chronic diastolic (congestive) heart failure: Secondary | ICD-10-CM | POA: Diagnosis not present

## 2013-12-24 DIAGNOSIS — IMO0001 Reserved for inherently not codable concepts without codable children: Secondary | ICD-10-CM | POA: Diagnosis not present

## 2013-12-24 DIAGNOSIS — N189 Chronic kidney disease, unspecified: Secondary | ICD-10-CM | POA: Diagnosis not present

## 2013-12-26 DIAGNOSIS — I129 Hypertensive chronic kidney disease with stage 1 through stage 4 chronic kidney disease, or unspecified chronic kidney disease: Secondary | ICD-10-CM | POA: Diagnosis not present

## 2013-12-26 DIAGNOSIS — I5032 Chronic diastolic (congestive) heart failure: Secondary | ICD-10-CM | POA: Diagnosis not present

## 2013-12-26 DIAGNOSIS — M726 Necrotizing fasciitis: Secondary | ICD-10-CM | POA: Diagnosis not present

## 2013-12-26 DIAGNOSIS — IMO0001 Reserved for inherently not codable concepts without codable children: Secondary | ICD-10-CM | POA: Diagnosis not present

## 2013-12-26 DIAGNOSIS — I509 Heart failure, unspecified: Secondary | ICD-10-CM | POA: Diagnosis not present

## 2013-12-26 DIAGNOSIS — N189 Chronic kidney disease, unspecified: Secondary | ICD-10-CM | POA: Diagnosis not present

## 2013-12-28 ENCOUNTER — Encounter (HOSPITAL_COMMUNITY)
Admission: RE | Admit: 2013-12-28 | Discharge: 2013-12-28 | Disposition: A | Discharge status: Home / Self Care | Payer: Medicare Other | Source: Ambulatory Visit | Attending: Nephrology | Admitting: Nephrology

## 2013-12-28 ENCOUNTER — Encounter (HOSPITAL_COMMUNITY): Payer: Self-pay

## 2013-12-28 DIAGNOSIS — E756 Lipid storage disorder, unspecified: Secondary | ICD-10-CM | POA: Diagnosis not present

## 2013-12-28 DIAGNOSIS — I5032 Chronic diastolic (congestive) heart failure: Secondary | ICD-10-CM | POA: Diagnosis not present

## 2013-12-28 DIAGNOSIS — IMO0001 Reserved for inherently not codable concepts without codable children: Secondary | ICD-10-CM | POA: Diagnosis not present

## 2013-12-28 DIAGNOSIS — I129 Hypertensive chronic kidney disease with stage 1 through stage 4 chronic kidney disease, or unspecified chronic kidney disease: Secondary | ICD-10-CM | POA: Diagnosis not present

## 2013-12-28 DIAGNOSIS — M726 Necrotizing fasciitis: Secondary | ICD-10-CM | POA: Diagnosis not present

## 2013-12-28 DIAGNOSIS — I509 Heart failure, unspecified: Secondary | ICD-10-CM | POA: Diagnosis not present

## 2013-12-28 DIAGNOSIS — N189 Chronic kidney disease, unspecified: Secondary | ICD-10-CM | POA: Diagnosis not present

## 2013-12-28 MED ORDER — SODIUM CHLORIDE 0.9 % IV SOLN
1.0000 mg/kg | INTRAVENOUS | Status: DC
  Administered 2013-12-28: 115 mg via INTRAVENOUS
  Filled 2013-12-28: qty 23

## 2013-12-28 MED ORDER — SODIUM CHLORIDE 0.9 % IV SOLN
INTRAVENOUS | Status: DC
  Administered 2013-12-28: 11:00:00 via INTRAVENOUS

## 2013-12-31 DIAGNOSIS — IMO0001 Reserved for inherently not codable concepts without codable children: Secondary | ICD-10-CM | POA: Diagnosis not present

## 2013-12-31 DIAGNOSIS — I5032 Chronic diastolic (congestive) heart failure: Secondary | ICD-10-CM | POA: Diagnosis not present

## 2013-12-31 DIAGNOSIS — N189 Chronic kidney disease, unspecified: Secondary | ICD-10-CM | POA: Diagnosis not present

## 2013-12-31 DIAGNOSIS — I129 Hypertensive chronic kidney disease with stage 1 through stage 4 chronic kidney disease, or unspecified chronic kidney disease: Secondary | ICD-10-CM | POA: Diagnosis not present

## 2013-12-31 DIAGNOSIS — M726 Necrotizing fasciitis: Secondary | ICD-10-CM | POA: Diagnosis not present

## 2013-12-31 DIAGNOSIS — I509 Heart failure, unspecified: Secondary | ICD-10-CM | POA: Diagnosis not present

## 2014-01-02 DIAGNOSIS — I129 Hypertensive chronic kidney disease with stage 1 through stage 4 chronic kidney disease, or unspecified chronic kidney disease: Secondary | ICD-10-CM | POA: Diagnosis not present

## 2014-01-02 DIAGNOSIS — IMO0001 Reserved for inherently not codable concepts without codable children: Secondary | ICD-10-CM | POA: Diagnosis not present

## 2014-01-02 DIAGNOSIS — I5032 Chronic diastolic (congestive) heart failure: Secondary | ICD-10-CM | POA: Diagnosis not present

## 2014-01-02 DIAGNOSIS — I509 Heart failure, unspecified: Secondary | ICD-10-CM | POA: Diagnosis not present

## 2014-01-02 DIAGNOSIS — M726 Necrotizing fasciitis: Secondary | ICD-10-CM | POA: Diagnosis not present

## 2014-01-02 DIAGNOSIS — N189 Chronic kidney disease, unspecified: Secondary | ICD-10-CM | POA: Diagnosis not present

## 2014-01-04 DIAGNOSIS — I5032 Chronic diastolic (congestive) heart failure: Secondary | ICD-10-CM | POA: Diagnosis not present

## 2014-01-04 DIAGNOSIS — M726 Necrotizing fasciitis: Secondary | ICD-10-CM | POA: Diagnosis not present

## 2014-01-04 DIAGNOSIS — I509 Heart failure, unspecified: Secondary | ICD-10-CM | POA: Diagnosis not present

## 2014-01-04 DIAGNOSIS — N189 Chronic kidney disease, unspecified: Secondary | ICD-10-CM | POA: Diagnosis not present

## 2014-01-04 DIAGNOSIS — IMO0001 Reserved for inherently not codable concepts without codable children: Secondary | ICD-10-CM | POA: Diagnosis not present

## 2014-01-04 DIAGNOSIS — I129 Hypertensive chronic kidney disease with stage 1 through stage 4 chronic kidney disease, or unspecified chronic kidney disease: Secondary | ICD-10-CM | POA: Diagnosis not present

## 2014-01-07 ENCOUNTER — Telehealth: Payer: Self-pay | Admitting: *Deleted

## 2014-01-07 DIAGNOSIS — M726 Necrotizing fasciitis: Secondary | ICD-10-CM | POA: Diagnosis not present

## 2014-01-07 DIAGNOSIS — I509 Heart failure, unspecified: Secondary | ICD-10-CM | POA: Diagnosis not present

## 2014-01-07 DIAGNOSIS — I5032 Chronic diastolic (congestive) heart failure: Secondary | ICD-10-CM | POA: Diagnosis not present

## 2014-01-07 DIAGNOSIS — N189 Chronic kidney disease, unspecified: Secondary | ICD-10-CM | POA: Diagnosis not present

## 2014-01-07 DIAGNOSIS — IMO0001 Reserved for inherently not codable concepts without codable children: Secondary | ICD-10-CM | POA: Diagnosis not present

## 2014-01-07 DIAGNOSIS — I129 Hypertensive chronic kidney disease with stage 1 through stage 4 chronic kidney disease, or unspecified chronic kidney disease: Secondary | ICD-10-CM | POA: Diagnosis not present

## 2014-01-07 NOTE — Telephone Encounter (Signed)
Hi [name],  Please review chart and advise if appropriate for [facility]. Previsit January 08, 2014.  Thank you, Olivia Mackie

## 2014-01-08 ENCOUNTER — Ambulatory Visit (AMBULATORY_SURGERY_CENTER): Payer: Medicare Other | Admitting: *Deleted

## 2014-01-08 VITALS — Ht 69.0 in | Wt 254.8 lb

## 2014-01-08 DIAGNOSIS — Z1211 Encounter for screening for malignant neoplasm of colon: Secondary | ICD-10-CM

## 2014-01-08 MED ORDER — MOVIPREP 100 G PO SOLR: 1.0000 | Freq: Once | ORAL | Status: DC

## 2014-01-08 NOTE — Progress Notes (Signed)
No egg or soy allergy. No anesthesia problems.  No home O2.  No diet meds.  

## 2014-01-08 NOTE — Telephone Encounter (Signed)
[name],   Mr. Helferich is cleared for care at [facility].  Thanks,  Osvaldo Angst

## 2014-01-09 DIAGNOSIS — M726 Necrotizing fasciitis: Secondary | ICD-10-CM | POA: Diagnosis not present

## 2014-01-09 DIAGNOSIS — I509 Heart failure, unspecified: Secondary | ICD-10-CM | POA: Diagnosis not present

## 2014-01-09 DIAGNOSIS — I129 Hypertensive chronic kidney disease with stage 1 through stage 4 chronic kidney disease, or unspecified chronic kidney disease: Secondary | ICD-10-CM | POA: Diagnosis not present

## 2014-01-09 DIAGNOSIS — N189 Chronic kidney disease, unspecified: Secondary | ICD-10-CM | POA: Diagnosis not present

## 2014-01-09 DIAGNOSIS — I5032 Chronic diastolic (congestive) heart failure: Secondary | ICD-10-CM | POA: Diagnosis not present

## 2014-01-09 DIAGNOSIS — IMO0001 Reserved for inherently not codable concepts without codable children: Secondary | ICD-10-CM | POA: Diagnosis not present

## 2014-01-11 ENCOUNTER — Encounter (HOSPITAL_COMMUNITY): Payer: Self-pay

## 2014-01-11 ENCOUNTER — Encounter (HOSPITAL_COMMUNITY)
Admission: RE | Admit: 2014-01-11 | Discharge: 2014-01-11 | Disposition: A | Discharge status: Home / Self Care | Payer: Medicare Other | Source: Ambulatory Visit | Attending: Nephrology | Admitting: Nephrology

## 2014-01-11 DIAGNOSIS — I5032 Chronic diastolic (congestive) heart failure: Secondary | ICD-10-CM | POA: Diagnosis not present

## 2014-01-11 DIAGNOSIS — E756 Lipid storage disorder, unspecified: Secondary | ICD-10-CM | POA: Insufficient documentation

## 2014-01-11 DIAGNOSIS — M726 Necrotizing fasciitis: Secondary | ICD-10-CM | POA: Diagnosis not present

## 2014-01-11 DIAGNOSIS — I509 Heart failure, unspecified: Secondary | ICD-10-CM | POA: Diagnosis not present

## 2014-01-11 DIAGNOSIS — IMO0001 Reserved for inherently not codable concepts without codable children: Secondary | ICD-10-CM | POA: Diagnosis not present

## 2014-01-11 DIAGNOSIS — N189 Chronic kidney disease, unspecified: Secondary | ICD-10-CM | POA: Diagnosis not present

## 2014-01-11 DIAGNOSIS — I129 Hypertensive chronic kidney disease with stage 1 through stage 4 chronic kidney disease, or unspecified chronic kidney disease: Secondary | ICD-10-CM | POA: Diagnosis not present

## 2014-01-11 LAB — COMPREHENSIVE METABOLIC PANEL
ALT: 16 U/L (ref 0–53)
AST: 23 U/L (ref 0–37)
Albumin: 3.2 g/dL — ABNORMAL LOW (ref 3.5–5.2)
Alkaline Phosphatase: 79 U/L (ref 39–117)
Anion gap: 11 (ref 5–15)
BUN: 15 mg/dL (ref 6–23)
CO2: 24 mEq/L (ref 19–32)
Calcium: 9.3 mg/dL (ref 8.4–10.5)
Chloride: 106 mEq/L (ref 96–112)
Creatinine, Ser: 1.19 mg/dL (ref 0.50–1.35)
GFR calc Af Amer: 80 mL/min — ABNORMAL LOW (ref >90)
GFR calc non Af Amer: 69 mL/min — ABNORMAL LOW (ref >90)
Glucose, Bld: 147 mg/dL — ABNORMAL HIGH (ref 70–99)
Potassium: 4.3 mEq/L (ref 3.7–5.3)
Sodium: 141 mEq/L (ref 137–147)
Total Bilirubin: 0.4 mg/dL (ref 0.3–1.2)
Total Protein: 8 g/dL (ref 6.0–8.3)

## 2014-01-11 LAB — URINALYSIS, ROUTINE W REFLEX MICROSCOPIC
Bilirubin Urine: NEGATIVE
Glucose, UA: NEGATIVE mg/dL
Hgb urine dipstick: NEGATIVE
Ketones, ur: NEGATIVE mg/dL
Leukocytes, UA: NEGATIVE
Nitrite: NEGATIVE
Protein, ur: 100 mg/dL — AB
Specific Gravity, Urine: 1.013 (ref 1.005–1.030)
Urobilinogen, UA: 0.2 mg/dL (ref 0.0–1.0)
pH: 5 (ref 5.0–8.0)

## 2014-01-11 LAB — URINE MICROSCOPIC-ADD ON

## 2014-01-11 LAB — PHOSPHORUS: Phosphorus: 4.3 mg/dL (ref 2.3–4.6)

## 2014-01-11 MED ORDER — SODIUM CHLORIDE 0.9 % IV SOLN
INTRAVENOUS | Status: AC
  Administered 2014-01-11: 09:00:00 via INTRAVENOUS

## 2014-01-11 MED ORDER — SODIUM CHLORIDE 0.9 % IV SOLN
1.0000 mg/kg | INTRAVENOUS | Status: DC
  Administered 2014-01-11: 115 mg via INTRAVENOUS
  Filled 2014-01-11: qty 23

## 2014-01-11 MED ORDER — SODIUM CHLORIDE 0.9 % IV SOLN: INTRAVENOUS | Status: DC

## 2014-01-11 NOTE — Progress Notes (Signed)
Infusion completed. In room 11 waiting for Mother

## 2014-01-15 DIAGNOSIS — M726 Necrotizing fasciitis: Secondary | ICD-10-CM | POA: Diagnosis not present

## 2014-01-15 DIAGNOSIS — I509 Heart failure, unspecified: Secondary | ICD-10-CM | POA: Diagnosis not present

## 2014-01-15 DIAGNOSIS — N189 Chronic kidney disease, unspecified: Secondary | ICD-10-CM | POA: Diagnosis not present

## 2014-01-15 DIAGNOSIS — I129 Hypertensive chronic kidney disease with stage 1 through stage 4 chronic kidney disease, or unspecified chronic kidney disease: Secondary | ICD-10-CM | POA: Diagnosis not present

## 2014-01-15 DIAGNOSIS — I5032 Chronic diastolic (congestive) heart failure: Secondary | ICD-10-CM | POA: Diagnosis not present

## 2014-01-15 DIAGNOSIS — IMO0001 Reserved for inherently not codable concepts without codable children: Secondary | ICD-10-CM | POA: Diagnosis not present

## 2014-01-21 DIAGNOSIS — I509 Heart failure, unspecified: Secondary | ICD-10-CM | POA: Diagnosis not present

## 2014-01-21 DIAGNOSIS — I5032 Chronic diastolic (congestive) heart failure: Secondary | ICD-10-CM | POA: Diagnosis not present

## 2014-01-21 DIAGNOSIS — IMO0001 Reserved for inherently not codable concepts without codable children: Secondary | ICD-10-CM | POA: Diagnosis not present

## 2014-01-21 DIAGNOSIS — N189 Chronic kidney disease, unspecified: Secondary | ICD-10-CM | POA: Diagnosis not present

## 2014-01-21 DIAGNOSIS — M726 Necrotizing fasciitis: Secondary | ICD-10-CM | POA: Diagnosis not present

## 2014-01-21 DIAGNOSIS — I129 Hypertensive chronic kidney disease with stage 1 through stage 4 chronic kidney disease, or unspecified chronic kidney disease: Secondary | ICD-10-CM | POA: Diagnosis not present

## 2014-01-22 ENCOUNTER — Ambulatory Visit (AMBULATORY_SURGERY_CENTER): Payer: Medicare Other | Admitting: Gastroenterology

## 2014-01-22 ENCOUNTER — Encounter: Payer: Self-pay | Admitting: Gastroenterology

## 2014-01-22 VITALS — BP 138/78 | HR 60 | Temp 97.3°F | Resp 16 | Ht 69.0 in | Wt 254.0 lb

## 2014-01-22 DIAGNOSIS — Z1211 Encounter for screening for malignant neoplasm of colon: Secondary | ICD-10-CM | POA: Diagnosis not present

## 2014-01-22 DIAGNOSIS — D126 Benign neoplasm of colon, unspecified: Secondary | ICD-10-CM

## 2014-01-22 DIAGNOSIS — E119 Type 2 diabetes mellitus without complications: Secondary | ICD-10-CM | POA: Diagnosis not present

## 2014-01-22 DIAGNOSIS — I10 Essential (primary) hypertension: Secondary | ICD-10-CM | POA: Diagnosis not present

## 2014-01-22 LAB — GLUCOSE, CAPILLARY
Glucose-Capillary: 112 mg/dL — ABNORMAL HIGH (ref 70–99)
Glucose-Capillary: 102 mg/dL — ABNORMAL HIGH (ref 70–99)

## 2014-01-22 MED ORDER — SODIUM CHLORIDE 0.9 % IV SOLN: 500.0000 mL | INTRAVENOUS | Status: DC

## 2014-01-22 NOTE — Progress Notes (Signed)
Called to room to assist during endoscopic procedure.  Patient ID and intended procedure confirmed with present staff. Received instructions for my participation in the procedure from the performing physician.  

## 2014-01-22 NOTE — Progress Notes (Signed)
Report to PACU, RN, vss, BBS= Clear.  

## 2014-01-22 NOTE — Patient Instructions (Signed)
YOU HAD AN ENDOSCOPIC PROCEDURE TODAY AT THE [facility]: Refer to the procedure report that was given to you for any specific questions about what was found during the examination.  If the procedure report does not answer your questions, please call your gastroenterologist to clarify.  If you requested that your care partner not be given the details of your procedure findings, then the procedure report has been included in a sealed envelope for you to review at your convenience later.  YOU SHOULD EXPECT: Some feelings of bloating in the abdomen. Passage of more gas than usual.  Walking can help get rid of the air that was put into your GI tract during the procedure and reduce the bloating. If you had a lower endoscopy (such as a colonoscopy or flexible sigmoidoscopy) you may notice spotting of blood in your stool or on the toilet paper. If you underwent a bowel prep for your procedure, then you may not have a normal bowel movement for a few days.  DIET: Your first meal following the procedure should be a light meal and then it is ok to progress to your normal diet.  A half-sandwich or bowl of soup is an example of a good first meal.  Heavy or fried foods are harder to digest and may make you feel nauseous or bloated.  Likewise meals heavy in dairy and vegetables can cause extra gas to form and this can also increase the bloating.  Drink plenty of fluids but you should avoid alcoholic beverages for 24 hours.  ACTIVITY: Your care partner should take you home directly after the procedure.  You should plan to take it easy, moving slowly for the rest of the day.  You can resume normal activity the day after the procedure however you should NOT DRIVE or use heavy machinery for 24 hours (because of the sedation medicines used during the test).    SYMPTOMS TO REPORT IMMEDIATELY: A gastroenterologist can be reached at any hour.  During normal business hours, 8:30 AM to 5:00 PM Monday through Friday,  call [phone].  After hours and on weekends, please call the GI answering service at [phone] who will take a message and have the physician on call contact you.   Following lower endoscopy (colonoscopy or flexible sigmoidoscopy):  Excessive amounts of blood in the stool  Significant tenderness or worsening of abdominal pains  Swelling of the abdomen that is new, acute  Fever of 100F or higher  FOLLOW UP: If any biopsies were taken you will be contacted by phone or by letter within the next 1-3 weeks.  Call your gastroenterologist if you have not heard about the biopsies in 3 weeks.  Our staff will call the home number listed on your records the next business day following your procedure to check on you and address any questions or concerns that you may have at that time regarding the information given to you following your procedure. This is a courtesy call and so if there is no answer at the home number and we have not heard from you through the emergency physician on call, we will assume that you have returned to your regular daily activities without incident.  SIGNATURES/CONFIDENTIALITY: You and/or your care partner have signed paperwork which will be entered into your electronic medical record.  These signatures attest to the fact that that the information above on your After Visit Summary has been reviewed and is understood.  Full responsibility of the confidentiality of this   discharge information lies with you and/or your care-partner.    Resume medications. Information given on polyps with discharge instructions. 

## 2014-01-22 NOTE — Op Note (Signed)
[place]  [address] [place], [address]   COLONOSCOPY PROCEDURE REPORT  PATIENT: [name], [name]  MR#: [id number] BIRTHDATE: [birth year] , 1  yrs. old GENDER: Male ENDOSCOPIST: Inda Castle, MD REFERRED [id number] Blount, M.D. PROCEDURE DATE:  01/22/2014 PROCEDURE:   Colonoscopy with cold biopsy polypectomy First Screening Colonoscopy - Avg.  risk and is 50 yrs.  old or older Yes.  Prior Negative Screening - Now for repeat screening. N/A  History of Adenoma - Now for follow-up colonoscopy & has been > or = to 3 yrs.  N/A  Polyps Removed Today? Yes. ASA CLASS:   Class III INDICATIONS:average risk screening. MEDICATIONS: MAC sedation, administered by CRNA and Propofol (Diprivan) 290 mg IV  DESCRIPTION OF PROCEDURE:   After the risks benefits and alternatives of the procedure were thoroughly explained, informed consent was obtained.  A digital rectal exam revealed no abnormalities of the rectum.   The LB [id number]  endoscope was introduced through the anus and advanced to the cecum, which was identified by both the appendix and ileocecal valve. No adverse events experienced.   The quality of the prep was Suprep adequate The instrument was then slowly withdrawn as the colon was fully examined.      COLON FINDINGS: A flat polyp measuring 2 mm in size was found in the rectum.  A polypectomy was performed with cold forceps.   The colon was otherwise normal.  There was no diverticulosis, inflammation, polyps or cancers unless previously stated.  Retroflexed views revealed no abnormalities. The time to cecum=2 minutes 02 seconds. Withdrawal time=9 minutes 44 seconds.  The scope was withdrawn and the procedure completed. COMPLICATIONS: There were no complications.  ENDOSCOPIC IMPRESSION: 1.   Flat polyp measuring 2 mm in size was found in the rectum; polypectomy was performed with cold forceps 2.   The colon was otherwise  normal  RECOMMENDATIONS: If the polyp(s) removed today are proven to be adenomatous (pre-cancerous) polyps, you will need a repeat colonoscopy in 5 years.  Otherwise you should continue to follow colorectal cancer screening guidelines for "routine risk" patients with colonoscopy in 10 years.  You will receive a letter within 1-2 weeks with the results of your biopsy as well as final recommendations.  Please call my office if you have not received a letter after 3 weeks.   eSigned:  Inda Castle, MD 01/22/2014 11:51 AM   cc:   PATIENT NAME:  [name], [name] MR#: [id number]

## 2014-01-23 ENCOUNTER — Telehealth: Payer: Self-pay | Admitting: *Deleted

## 2014-01-23 NOTE — Telephone Encounter (Signed)
  Follow up Call-  Call back number 01/22/2014  Post procedure Call Back phone  # [phone]  Permission to leave phone message Yes     Patient questions:  Do you have a fever, pain , or abdominal swelling? No. Pain Score  0 *  Have you tolerated food without any problems? Yes.    Have you been able to return to your normal activities? Yes.    Do you have any questions about your discharge instructions: Diet   No. Medications  No. Follow up visit  No.  Do you have questions or concerns about your Care? No.  Actions: * If pain score is 4 or above: No action needed, pain <4.

## 2014-01-25 ENCOUNTER — Encounter (HOSPITAL_COMMUNITY): Payer: Self-pay

## 2014-01-25 ENCOUNTER — Encounter (HOSPITAL_COMMUNITY)
Admission: RE | Admit: 2014-01-25 | Discharge: 2014-01-25 | Disposition: A | Discharge status: Home / Self Care | Payer: Medicare Other | Source: Ambulatory Visit | Attending: Nephrology | Admitting: Nephrology

## 2014-01-25 DIAGNOSIS — E756 Lipid storage disorder, unspecified: Secondary | ICD-10-CM | POA: Diagnosis not present

## 2014-01-25 LAB — LIPID PANEL
Cholesterol: 206 mg/dL — ABNORMAL HIGH (ref 0–200)
HDL: 51 mg/dL (ref >39)
LDL Cholesterol: 132 mg/dL — ABNORMAL HIGH (ref 0–99)
Total CHOL/HDL Ratio: 4 RATIO
Triglycerides: 115 mg/dL (ref <150)
VLDL: 23 mg/dL (ref 0–40)

## 2014-01-25 LAB — DIFFERENTIAL
Basophils Absolute: 0 10*3/uL (ref 0.0–0.1)
Basophils Relative: 0 % (ref 0–1)
Eosinophils Absolute: 0.5 10*3/uL (ref 0.0–0.7)
Eosinophils Relative: 5 % (ref 0–5)
Lymphocytes Relative: 35 % (ref 12–46)
Lymphs Abs: 3 10*3/uL (ref 0.7–4.0)
Monocytes Absolute: 0.7 10*3/uL (ref 0.1–1.0)
Monocytes Relative: 8 % (ref 3–12)
Neutro Abs: 4.5 10*3/uL (ref 1.7–7.7)
Neutrophils Relative %: 52 % (ref 43–77)

## 2014-01-25 LAB — IRON AND TIBC
Iron: 61 ug/dL (ref 42–135)
Saturation Ratios: 22 % (ref 20–55)
TIBC: 281 ug/dL (ref 215–435)
UIBC: 220 ug/dL (ref 125–400)

## 2014-01-25 LAB — CBC
HCT: 41.6 % (ref 39.0–52.0)
Hemoglobin: 13.8 g/dL (ref 13.0–17.0)
MCH: 28.5 pg (ref 26.0–34.0)
MCHC: 33.2 g/dL (ref 30.0–36.0)
MCV: 86 fL (ref 78.0–100.0)
Platelets: 209 10*3/uL (ref 150–400)
RBC: 4.84 MIL/uL (ref 4.22–5.81)
RDW: 14.2 % (ref 11.5–15.5)
WBC: 8.6 10*3/uL (ref 4.0–10.5)

## 2014-01-25 LAB — FERRITIN: Ferritin: 152 ng/mL (ref 22–322)

## 2014-01-25 MED ORDER — SODIUM CHLORIDE 0.9 % IV SOLN
1.0000 mg/kg | INTRAVENOUS | Status: DC
  Administered 2014-01-25: 115 mg via INTRAVENOUS
  Filled 2014-01-25: qty 23

## 2014-01-25 MED ORDER — SODIUM CHLORIDE 0.9 % IV SOLN
INTRAVENOUS | Status: AC
  Administered 2014-01-25: 08:00:00 via INTRAVENOUS

## 2014-01-25 NOTE — Progress Notes (Signed)
Patient took pre infusion medication prior to arrival to unit.

## 2014-01-28 ENCOUNTER — Encounter (HOSPITAL_COMMUNITY): Payer: Medicare Other

## 2014-01-29 ENCOUNTER — Encounter: Payer: Self-pay | Admitting: Gastroenterology

## 2014-02-04 ENCOUNTER — Encounter: Payer: Self-pay | Admitting: Interventional Cardiology

## 2014-02-08 ENCOUNTER — Encounter (HOSPITAL_COMMUNITY)
Admission: RE | Admit: 2014-02-08 | Discharge: 2014-02-08 | Disposition: A | Discharge status: Home / Self Care | Payer: Medicare Other | Source: Ambulatory Visit | Attending: Nephrology | Admitting: Nephrology

## 2014-02-08 ENCOUNTER — Encounter (HOSPITAL_COMMUNITY): Payer: Self-pay

## 2014-02-08 ENCOUNTER — Other Ambulatory Visit (HOSPITAL_COMMUNITY): Payer: Self-pay | Admitting: Nephrology

## 2014-02-08 DIAGNOSIS — E756 Lipid storage disorder, unspecified: Secondary | ICD-10-CM | POA: Insufficient documentation

## 2014-02-08 LAB — URINALYSIS, ROUTINE W REFLEX MICROSCOPIC
Bilirubin Urine: NEGATIVE
Glucose, UA: NEGATIVE mg/dL
Hgb urine dipstick: NEGATIVE
Ketones, ur: NEGATIVE mg/dL
Leukocytes, UA: NEGATIVE
Nitrite: NEGATIVE
Protein, ur: 30 mg/dL — AB
Specific Gravity, Urine: 1.011 (ref 1.005–1.030)
Urobilinogen, UA: 0.2 mg/dL (ref 0.0–1.0)
pH: 5.5 (ref 5.0–8.0)

## 2014-02-08 LAB — COMPREHENSIVE METABOLIC PANEL
ALT: 16 U/L (ref 0–53)
AST: 22 U/L (ref 0–37)
Albumin: 3.2 g/dL — ABNORMAL LOW (ref 3.5–5.2)
Alkaline Phosphatase: 70 U/L (ref 39–117)
Anion gap: 13 (ref 5–15)
BUN: 19 mg/dL (ref 6–23)
CO2: 23 mEq/L (ref 19–32)
Calcium: 9.4 mg/dL (ref 8.4–10.5)
Chloride: 103 mEq/L (ref 96–112)
Creatinine, Ser: 1.12 mg/dL (ref 0.50–1.35)
GFR calc Af Amer: 85 mL/min — ABNORMAL LOW (ref >90)
GFR calc non Af Amer: 73 mL/min — ABNORMAL LOW (ref >90)
Glucose, Bld: 119 mg/dL — ABNORMAL HIGH (ref 70–99)
Potassium: 4.1 mEq/L (ref 3.7–5.3)
Sodium: 139 mEq/L (ref 137–147)
Total Bilirubin: 0.3 mg/dL (ref 0.3–1.2)
Total Protein: 7.9 g/dL (ref 6.0–8.3)

## 2014-02-08 LAB — URINE MICROSCOPIC-ADD ON

## 2014-02-08 LAB — PHOSPHORUS: Phosphorus: 3.5 mg/dL (ref 2.3–4.6)

## 2014-02-08 MED ORDER — SODIUM CHLORIDE 0.9 % IV SOLN
115.0000 mg | INTRAVENOUS | Status: DC
  Administered 2014-02-08: 115 mg via INTRAVENOUS
  Filled 2014-02-08: qty 23

## 2014-02-08 MED ORDER — SODIUM CHLORIDE 0.9 % IV SOLN
Freq: Once | INTRAVENOUS | Status: AC
  Administered 2014-02-08: 250 mL via INTRAVENOUS

## 2014-02-08 NOTE — Discharge Instructions (Signed)
Agalsidase Beta injection What is this medicine? AGALSIDASE BETA is used to replace an enzyme that is missing in patients with Fabry disease. It is not a cure. This medicine may be used for other purposes; ask your health care provider or pharmacist if you have questions. COMMON BRAND NAME(S): Fabrazyme What should I tell my health care provider before I take this medicine? They need to know if you have any of these conditions: -heart disease -an unusual or allergic reaction to agalsidase beta, mannitol, other medicines, foods, dyes, or preservatives -pregnant or trying to get pregnant -breast-feeding How should I use this medicine? This medicine is for infusion into a vein. It is given by a health care professional in a hospital or clinic setting. Talk to your pediatrician regarding the use of this medicine in children. Special care may be needed. Overdosage: If you think you have taken too much of this medicine contact a poison control center or emergency room at once. NOTE: This medicine is only for you. Do not share this medicine with others. What if I miss a dose? It is important not to miss your dose. Call your doctor or health care professional if you are unable to keep an appointment. What may interact with this medicine? -amiodarone -chloroquine -gentamicin -hydroxychloroquine -monobenzone This list may not describe all possible interactions. Give your health care provider a list of all the medicines, herbs, non-prescription drugs, or dietary supplements you use. Also tell them if you smoke, drink alcohol, or use illegal drugs. Some items may interact with your medicine. What should I watch for while using this medicine? Visit your doctor or health care professional for regular checks on your progress. Tell your doctor or healthcare professional if your symptoms do not start to get better or if they get worse. There is a registry for patients with Fabry disease. The registry is  used to gather information about the disease and its effects. Talk to your health care provider if you would like to join the registry. What side effects may I notice from receiving this medicine? Side effects that you should report to your doctor or health care professional as soon as possible: -allergic reactions like skin rash, itching or hives, swelling of the face, lips, or tongue -breathing problems -chest pain, tightness -depression -dizziness -fast, irregular heart beat -swelling of the arms or legs Side effects that usually do not require medical attention (report to your doctor or health care professional if they continue or are bothersome): -aches or pains -anxiety -fever or chills at the time of injection -headache -nausea, vomiting -stomach pain, upset This list may not describe all possible side effects. Call your doctor for medical advice about side effects. You may report side effects to FDA at 1-800-FDA-1088. Where should I keep my medicine? This drug is given in a hospital or clinic and will not be stored at home. NOTE: This sheet is a summary. It may not cover all possible information. If you have questions about this medicine, talk to your doctor, pharmacist, or health care provider.  2015, Elsevier/Gold Standard. (2006-01-31 12:25:00)

## 2014-02-12 DIAGNOSIS — E119 Type 2 diabetes mellitus without complications: Secondary | ICD-10-CM | POA: Diagnosis not present

## 2014-02-13 ENCOUNTER — Encounter (HOSPITAL_COMMUNITY): Payer: Medicare Other

## 2014-02-26 ENCOUNTER — Encounter (HOSPITAL_COMMUNITY)
Admission: RE | Admit: 2014-02-26 | Discharge: 2014-02-26 | Disposition: A | Discharge status: Home / Self Care | Payer: Medicare Other | Source: Ambulatory Visit | Attending: Nephrology | Admitting: Nephrology

## 2014-02-26 DIAGNOSIS — E756 Lipid storage disorder, unspecified: Secondary | ICD-10-CM | POA: Diagnosis not present

## 2014-02-26 MED ORDER — SODIUM CHLORIDE 0.9 % IV SOLN
Freq: Once | INTRAVENOUS | Status: AC
  Administered 2014-02-26: 09:00:00 via INTRAVENOUS

## 2014-02-26 MED ORDER — SODIUM CHLORIDE 0.9 % IV SOLN
115.0000 mg | INTRAVENOUS | Status: DC
  Administered 2014-02-26: 115 mg via INTRAVENOUS
  Filled 2014-02-26: qty 23

## 2014-02-26 NOTE — Progress Notes (Signed)
Pt. Took pre-medications prior to arrival to unit.

## 2014-02-26 NOTE — Discharge Instructions (Signed)
Agalsidase Beta injection What is this medicine? AGALSIDASE BETA is used to replace an enzyme that is missing in patients with Fabry disease. It is not a cure. This medicine may be used for other purposes; ask your health care provider or pharmacist if you have questions. COMMON BRAND NAME(S): Fabrazyme What should I tell my health care provider before I take this medicine? They need to know if you have any of these conditions: -heart disease -an unusual or allergic reaction to agalsidase beta, mannitol, other medicines, foods, dyes, or preservatives -pregnant or trying to get pregnant -breast-feeding How should I use this medicine? This medicine is for infusion into a vein. It is given by a health care professional in a hospital or clinic setting. Talk to your pediatrician regarding the use of this medicine in children. Special care may be needed. Overdosage: If you think you have taken too much of this medicine contact a poison control center or emergency room at once. NOTE: This medicine is only for you. Do not share this medicine with others. What if I miss a dose? It is important not to miss your dose. Call your doctor or health care professional if you are unable to keep an appointment. What may interact with this medicine? -amiodarone -chloroquine -gentamicin -hydroxychloroquine -monobenzone This list may not describe all possible interactions. Give your health care provider a list of all the medicines, herbs, non-prescription drugs, or dietary supplements you use. Also tell them if you smoke, drink alcohol, or use illegal drugs. Some items may interact with your medicine. What should I watch for while using this medicine? Visit your doctor or health care professional for regular checks on your progress. Tell your doctor or healthcare professional if your symptoms do not start to get better or if they get worse. There is a registry for patients with Fabry disease. The registry is  used to gather information about the disease and its effects. Talk to your health care provider if you would like to join the registry. What side effects may I notice from receiving this medicine? Side effects that you should report to your doctor or health care professional as soon as possible: -allergic reactions like skin rash, itching or hives, swelling of the face, lips, or tongue -breathing problems -chest pain, tightness -depression -dizziness -fast, irregular heart beat -swelling of the arms or legs Side effects that usually do not require medical attention (report to your doctor or health care professional if they continue or are bothersome): -aches or pains -anxiety -fever or chills at the time of injection -headache -nausea, vomiting -stomach pain, upset This list may not describe all possible side effects. Call your doctor for medical advice about side effects. You may report side effects to FDA at 1-800-FDA-1088. Where should I keep my medicine? This drug is given in a hospital or clinic and will not be stored at home. NOTE: This sheet is a summary. It may not cover all possible information. If you have questions about this medicine, talk to your doctor, pharmacist, or health care provider.  2015, Elsevier/Gold Standard. (2006-01-31 12:25:00)

## 2014-02-27 ENCOUNTER — Encounter (HOSPITAL_COMMUNITY): Payer: Medicare Other

## 2014-03-06 DIAGNOSIS — E118 Type 2 diabetes mellitus with unspecified complications: Secondary | ICD-10-CM | POA: Diagnosis not present

## 2014-03-06 DIAGNOSIS — I890 Lymphedema, not elsewhere classified: Secondary | ICD-10-CM | POA: Diagnosis not present

## 2014-03-06 DIAGNOSIS — Z23 Encounter for immunization: Secondary | ICD-10-CM | POA: Diagnosis not present

## 2014-03-06 DIAGNOSIS — N182 Chronic kidney disease, stage 2 (mild): Secondary | ICD-10-CM | POA: Diagnosis not present

## 2014-03-06 DIAGNOSIS — I129 Hypertensive chronic kidney disease with stage 1 through stage 4 chronic kidney disease, or unspecified chronic kidney disease: Secondary | ICD-10-CM | POA: Diagnosis not present

## 2014-03-15 ENCOUNTER — Encounter (HOSPITAL_COMMUNITY): Payer: Self-pay

## 2014-03-15 ENCOUNTER — Encounter (HOSPITAL_COMMUNITY)
Admission: RE | Admit: 2014-03-15 | Discharge: 2014-03-15 | Disposition: A | Discharge status: Home / Self Care | Payer: Medicare Other | Source: Ambulatory Visit | Attending: Nephrology | Admitting: Nephrology

## 2014-03-15 DIAGNOSIS — E779 Disorder of glycoprotein metabolism, unspecified: Secondary | ICD-10-CM | POA: Insufficient documentation

## 2014-03-15 LAB — URINALYSIS, ROUTINE W REFLEX MICROSCOPIC
Bilirubin Urine: NEGATIVE
Glucose, UA: NEGATIVE mg/dL
Hgb urine dipstick: NEGATIVE
Ketones, ur: NEGATIVE mg/dL
Leukocytes, UA: NEGATIVE
Nitrite: NEGATIVE
Protein, ur: NEGATIVE mg/dL
Specific Gravity, Urine: 1.008 (ref 1.005–1.030)
Urobilinogen, UA: 0.2 mg/dL (ref 0.0–1.0)
pH: 6 (ref 5.0–8.0)

## 2014-03-15 LAB — COMPREHENSIVE METABOLIC PANEL
ALT: 23 U/L (ref 0–53)
AST: 24 U/L (ref 0–37)
Albumin: 3.1 g/dL — ABNORMAL LOW (ref 3.5–5.2)
Alkaline Phosphatase: 81 U/L (ref 39–117)
Anion gap: 18 — ABNORMAL HIGH (ref 5–15)
BUN: 25 mg/dL — ABNORMAL HIGH (ref 6–23)
CO2: 20 mEq/L (ref 19–32)
Calcium: 9.2 mg/dL (ref 8.4–10.5)
Chloride: 103 mEq/L (ref 96–112)
Creatinine, Ser: 1.27 mg/dL (ref 0.50–1.35)
GFR calc Af Amer: 73 mL/min — ABNORMAL LOW (ref >90)
GFR calc non Af Amer: 63 mL/min — ABNORMAL LOW (ref >90)
Glucose, Bld: 208 mg/dL — ABNORMAL HIGH (ref 70–99)
Potassium: 3.9 mEq/L (ref 3.7–5.3)
Sodium: 141 mEq/L (ref 137–147)
Total Bilirubin: 0.4 mg/dL (ref 0.3–1.2)
Total Protein: 8 g/dL (ref 6.0–8.3)

## 2014-03-15 LAB — PHOSPHORUS: Phosphorus: 3.7 mg/dL (ref 2.3–4.6)

## 2014-03-15 MED ORDER — SODIUM CHLORIDE 0.9 % IV SOLN
Freq: Once | INTRAVENOUS | Status: AC
  Administered 2014-03-15: 09:00:00 via INTRAVENOUS

## 2014-03-15 MED ORDER — SODIUM CHLORIDE 0.9 % IV SOLN
115.0000 mg | INTRAVENOUS | Status: DC
  Administered 2014-03-15: 115 mg via INTRAVENOUS
  Filled 2014-03-15: qty 23

## 2014-03-29 ENCOUNTER — Encounter (HOSPITAL_COMMUNITY)
Admission: RE | Admit: 2014-03-29 | Discharge: 2014-03-29 | Disposition: A | Discharge status: Home / Self Care | Payer: Medicare Other | Source: Ambulatory Visit | Attending: Nephrology | Admitting: Nephrology

## 2014-03-29 ENCOUNTER — Encounter (HOSPITAL_COMMUNITY): Payer: Self-pay

## 2014-03-29 DIAGNOSIS — E779 Disorder of glycoprotein metabolism, unspecified: Secondary | ICD-10-CM | POA: Diagnosis not present

## 2014-03-29 MED ORDER — SODIUM CHLORIDE 0.9 % IV SOLN
Freq: Once | INTRAVENOUS | Status: AC
  Administered 2014-03-29: 09:00:00 via INTRAVENOUS

## 2014-03-29 MED ORDER — SODIUM CHLORIDE 0.9 % IV SOLN
115.0000 mg | INTRAVENOUS | Status: DC
  Administered 2014-03-29: 115 mg via INTRAVENOUS
  Filled 2014-03-29: qty 23

## 2014-03-29 NOTE — Progress Notes (Signed)
Patient took own pre infusion medication on arrival to unit.

## 2014-03-29 NOTE — Progress Notes (Signed)
Infusion complete tolerated well.

## 2014-04-08 ENCOUNTER — Encounter: Payer: Self-pay | Admitting: Interventional Cardiology

## 2014-04-08 ENCOUNTER — Ambulatory Visit (INDEPENDENT_AMBULATORY_CARE_PROVIDER_SITE_OTHER): Payer: Medicare Other | Admitting: Interventional Cardiology

## 2014-04-08 VITALS — BP 130/82 | HR 61 | Ht 70.0 in | Wt 263.0 lb

## 2014-04-08 DIAGNOSIS — I441 Atrioventricular block, second degree: Secondary | ICD-10-CM

## 2014-04-08 DIAGNOSIS — I5032 Chronic diastolic (congestive) heart failure: Secondary | ICD-10-CM

## 2014-04-08 DIAGNOSIS — I10 Essential (primary) hypertension: Secondary | ICD-10-CM

## 2014-04-08 DIAGNOSIS — I480 Paroxysmal atrial fibrillation: Secondary | ICD-10-CM | POA: Diagnosis not present

## 2014-04-08 DIAGNOSIS — E7521 Fabry (-Anderson) disease: Secondary | ICD-10-CM | POA: Diagnosis not present

## 2014-04-08 DIAGNOSIS — Z950 Presence of cardiac pacemaker: Secondary | ICD-10-CM

## 2014-04-08 DIAGNOSIS — G4733 Obstructive sleep apnea (adult) (pediatric): Secondary | ICD-10-CM

## 2014-04-08 NOTE — Patient Instructions (Signed)
Your physician recommends that you continue on your current medications as directed. Please refer to the Current Medication list given to you today.  Your physician wants you to follow-up in: 1 year with Dr.Smith You will receive a reminder letter in the mail two months in advance. If you don't receive a letter, please call our office to schedule the follow-up appointment.  

## 2014-04-08 NOTE — Progress Notes (Signed)
[name], male   DOB: 03/26/1961, 53 y.o.   MRN: [id number]    [address], [address] [place], [place]  [address] Phone: [phone] Fax:  [phone]  Date:  04/08/2014   ID:  [name], DOB 11/06/1960, MRN [id number]  PCP:  Elizabeth Palau, MD   ASSESSMENT:  1. AV conduction system disease with dual-chamber pacemaker, asymptomatic 2. 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 Hypertension 4. Lymphedema 5. Chronic renal insufficiency  PLAN:  1. No specific change in current medical regimen  2. Clinical follow-up in one year  3. Continue current medical regimen   SUBJECTIVE: [name] is a 53 y.o. male is doing well and denies syncope. There is peripheral edema, left leg greater than right. He is known have lymphedema. He has conduction system disease related to Fabry disease. He is normally functioning DDD pacemaker. He has left ventricular hypertrophy possibly related to infiltration. He has chronic diastolic heart failure. He has not had syncope. He denies angina. No orthopnea. There is significant exertional fatigue   Wt Readings from Last 3 Encounters:  04/08/14 263 lb (119.296 kg)  03/29/14 261 lb 6 oz (118.559 kg)  03/15/14 259 lb (117.482 kg)     Past Medical History  Diagnosis Date  . Second degree Mobitz II AV block     with syncope, s/p PPM  . Hypertension   . Bifascicular block   . Diabetes mellitus   . Pacemaker     02/12/11  . Lymphedema of lower extremity LEFT  >  RIGHT    USES PUMP AT HOME-- DOES NOT WEAR HOSE  . Short of breath on exertion   . History of cellulitis of skin with lymphangitis LEFT LEG  . Diastolic heart failure secondary to hypertrophic cardiomyopathy CARDIOLOGIST-- DR Daneen Schick  . Gout, arthritis 11/2011    bil feet. right worse  . Seasonal asthma NO INHALERS  . Fall 07/2013  . Edema 07/2013  . CHF (congestive heart failure) 2010  .  Fabry's disease RENAL AND CARDIAC INVOVLEMENT    FOLLOWED DR COLADOANTO  . Chronic renal insufficiency     Current Outpatient Prescriptions  Medication Sig Dispense Refill  . acetaminophen (TYLENOL) 500 MG tablet Only taking 1 hour before infusion.  AS premed for Fabrazyme    . Agalsidase beta (FABRAZYME IV) Inject into the vein every 14 (fourteen) days.     . ALBUTEROL SULFATE HFA IN Inhale 1 puff into the lungs as needed.    [name] Kitchen allopurinol (ZYLOPRIM) 300 MG tablet Take 300 mg by mouth daily.    [name] Kitchen aspirin 81 MG tablet Take 81 mg by mouth daily.     . colchicine 0.6 MG tablet Take 0.6 mg by mouth daily.    [name] Kitchen eplerenone (INSPRA) 50 MG tablet Take 50 mg by mouth daily.    . fexofenadine (ALLEGRA) 180 MG tablet Take 180 mg by mouth daily.    [name] Kitchen glyBURIDE (DIABETA) 5 MG tablet Take 5 mg by mouth daily with breakfast.    . losartan-hydrochlorothiazide (HYZAAR) 50-12.5 MG per tablet Take 1 tablet by mouth daily.    . [DISCONTINUED] furosemide (LASIX) 20 MG tablet Take 80 mg by mouth daily.      No current facility-administered medications for this visit.    Allergies:    Allergies  Allergen Reactions  . Shellfish Allergy Hives and Swelling    Social History:  The patient  reports that he has never smoked. He has never used smokeless tobacco. He reports that he drinks alcohol. He reports that he does not use illicit drugs.   ROS:  Please see the history of present illness.   No claudication. No syncope. No bleeding.   All other systems reviewed and negative.   OBJECTIVE: VS:  BP 130/82 mmHg  Pulse 61  Ht 5\' 10"  (1.778 m)  Wt 263 lb (119.296 kg)  BMI 37.74 kg/m2  SpO2 95% Well nourished, well developed, in no acute distress, obese HEENT: normal Neck: JVD flat. Carotid bruit absent  Cardiac:  normal S1, S2; RRR; no murmur Lungs:  clear to auscultation bilaterally, no wheezing, rhonchi or rales Abd: soft, nontender, no hepatomegaly Ext: Edema left greater than right lymphedema.  Pulses 2+ and symmetric Skin: warm and dry Neuro:  CNs 2-12 intact, no focal abnormalities noted  EKG:  Not completed       Signed, Illene Labrador III, MD 04/08/2014 12:03 PM

## 2014-04-12 ENCOUNTER — Other Ambulatory Visit (HOSPITAL_COMMUNITY): Payer: Self-pay | Admitting: Nephrology

## 2014-04-12 ENCOUNTER — Encounter (HOSPITAL_COMMUNITY): Payer: Self-pay

## 2014-04-12 ENCOUNTER — Encounter (HOSPITAL_COMMUNITY)
Admission: RE | Admit: 2014-04-12 | Discharge: 2014-04-12 | Disposition: A | Discharge status: Home / Self Care | Payer: Medicare Other | Source: Ambulatory Visit | Attending: Nephrology | Admitting: Nephrology

## 2014-04-12 DIAGNOSIS — E779 Disorder of glycoprotein metabolism, unspecified: Secondary | ICD-10-CM | POA: Diagnosis not present

## 2014-04-12 LAB — IRON AND TIBC
Iron: 47 ug/dL (ref 42–135)
Saturation Ratios: 19 % — ABNORMAL LOW (ref 20–55)
TIBC: 254 ug/dL (ref 215–435)
UIBC: 207 ug/dL (ref 125–400)

## 2014-04-12 LAB — COMPREHENSIVE METABOLIC PANEL
ALT: 25 U/L (ref 0–53)
AST: 27 U/L (ref 0–37)
Albumin: 3.1 g/dL — ABNORMAL LOW (ref 3.5–5.2)
Alkaline Phosphatase: 83 U/L (ref 39–117)
Anion gap: 15 (ref 5–15)
BUN: 20 mg/dL (ref 6–23)
CO2: 23 mEq/L (ref 19–32)
Calcium: 9.3 mg/dL (ref 8.4–10.5)
Chloride: 104 mEq/L (ref 96–112)
Creatinine, Ser: 1.42 mg/dL — ABNORMAL HIGH (ref 0.50–1.35)
GFR calc Af Amer: 64 mL/min — ABNORMAL LOW (ref >90)
GFR calc non Af Amer: 55 mL/min — ABNORMAL LOW (ref >90)
Glucose, Bld: 127 mg/dL — ABNORMAL HIGH (ref 70–99)
Potassium: 4.3 mEq/L (ref 3.7–5.3)
Sodium: 142 mEq/L (ref 137–147)
Total Bilirubin: <0.2 mg/dL — ABNORMAL LOW (ref 0.3–1.2)
Total Protein: 7.7 g/dL (ref 6.0–8.3)

## 2014-04-12 LAB — CBC WITH DIFFERENTIAL/PLATELET
Basophils Absolute: 0 10*3/uL (ref 0.0–0.1)
Basophils Relative: 0 % (ref 0–1)
Eosinophils Absolute: 0.3 10*3/uL (ref 0.0–0.7)
Eosinophils Relative: 5 % (ref 0–5)
HCT: 43.3 % (ref 39.0–52.0)
Hemoglobin: 14.5 g/dL (ref 13.0–17.0)
Lymphocytes Relative: 51 % — ABNORMAL HIGH (ref 12–46)
Lymphs Abs: 3.7 10*3/uL (ref 0.7–4.0)
MCH: 29.5 pg (ref 26.0–34.0)
MCHC: 33.5 g/dL (ref 30.0–36.0)
MCV: 88 fL (ref 78.0–100.0)
Monocytes Absolute: 0.7 10*3/uL (ref 0.1–1.0)
Monocytes Relative: 9 % (ref 3–12)
Neutro Abs: 2.5 10*3/uL (ref 1.7–7.7)
Neutrophils Relative %: 35 % — ABNORMAL LOW (ref 43–77)
Platelets: 200 10*3/uL (ref 150–400)
RBC: 4.92 MIL/uL (ref 4.22–5.81)
RDW: 13.2 % (ref 11.5–15.5)
WBC: 7.2 10*3/uL (ref 4.0–10.5)

## 2014-04-12 LAB — URINALYSIS, ROUTINE W REFLEX MICROSCOPIC
Bilirubin Urine: NEGATIVE
Glucose, UA: NEGATIVE mg/dL
Hgb urine dipstick: NEGATIVE
Ketones, ur: NEGATIVE mg/dL
Leukocytes, UA: NEGATIVE
Nitrite: NEGATIVE
Protein, ur: 30 mg/dL — AB
Specific Gravity, Urine: 1.01 (ref 1.005–1.030)
Urobilinogen, UA: 0.2 mg/dL (ref 0.0–1.0)
pH: 5.5 (ref 5.0–8.0)

## 2014-04-12 LAB — LIPID PANEL
Cholesterol: 238 mg/dL — ABNORMAL HIGH (ref 0–200)
HDL: 47 mg/dL (ref >39)
LDL Cholesterol: 148 mg/dL — ABNORMAL HIGH (ref 0–99)
Total CHOL/HDL Ratio: 5.1 RATIO
Triglycerides: 216 mg/dL — ABNORMAL HIGH (ref <150)
VLDL: 43 mg/dL — ABNORMAL HIGH (ref 0–40)

## 2014-04-12 LAB — FERRITIN: Ferritin: 143 ng/mL (ref 22–322)

## 2014-04-12 LAB — URINE MICROSCOPIC-ADD ON: Urine-Other: NONE SEEN

## 2014-04-12 LAB — PHOSPHORUS: Phosphorus: 4.5 mg/dL (ref 2.3–4.6)

## 2014-04-12 MED ORDER — SODIUM CHLORIDE 0.9 % IV SOLN
INTRAVENOUS | Status: DC
  Administered 2014-04-12: 10:00:00 via INTRAVENOUS

## 2014-04-12 MED ORDER — SODIUM CHLORIDE 0.9 % IV SOLN
115.0000 mg | INTRAVENOUS | Status: DC
  Administered 2014-04-12: 115 mg via INTRAVENOUS
  Filled 2014-04-12: qty 23

## 2014-04-26 ENCOUNTER — Encounter (HOSPITAL_COMMUNITY): Payer: Self-pay

## 2014-04-26 ENCOUNTER — Encounter (HOSPITAL_COMMUNITY)
Admission: RE | Admit: 2014-04-26 | Discharge: 2014-04-26 | Disposition: A | Discharge status: Home / Self Care | Payer: Medicare Other | Source: Ambulatory Visit | Attending: Nephrology | Admitting: Nephrology

## 2014-04-26 DIAGNOSIS — E779 Disorder of glycoprotein metabolism, unspecified: Secondary | ICD-10-CM | POA: Diagnosis not present

## 2014-04-26 MED ORDER — SODIUM CHLORIDE 0.9 % IV SOLN
115.0000 mg | INTRAVENOUS | Status: AC
  Administered 2014-04-26: 115 mg via INTRAVENOUS
  Filled 2014-04-26: qty 23

## 2014-04-26 MED ORDER — SODIUM CHLORIDE 0.9 % IV SOLN
INTRAVENOUS | Status: AC
  Administered 2014-04-26: 09:00:00 via INTRAVENOUS

## 2014-05-10 ENCOUNTER — Other Ambulatory Visit (HOSPITAL_COMMUNITY): Payer: Self-pay | Admitting: Nephrology

## 2014-05-10 ENCOUNTER — Encounter (HOSPITAL_COMMUNITY)
Admission: RE | Admit: 2014-05-10 | Discharge: 2014-05-10 | Disposition: A | Discharge status: Home / Self Care | Payer: Medicare Other | Source: Ambulatory Visit | Attending: Nephrology | Admitting: Nephrology

## 2014-05-10 DIAGNOSIS — E779 Disorder of glycoprotein metabolism, unspecified: Secondary | ICD-10-CM | POA: Diagnosis not present

## 2014-05-10 LAB — PHOSPHORUS: Phosphorus: 4.5 mg/dL (ref 2.3–4.6)

## 2014-05-10 LAB — URINALYSIS, ROUTINE W REFLEX MICROSCOPIC
Bilirubin Urine: NEGATIVE
Glucose, UA: 100 mg/dL — AB
Hgb urine dipstick: NEGATIVE
Ketones, ur: NEGATIVE mg/dL
Leukocytes, UA: NEGATIVE
Nitrite: NEGATIVE
Protein, ur: 100 mg/dL — AB
Specific Gravity, Urine: 1.01 (ref 1.005–1.030)
Urobilinogen, UA: 0.2 mg/dL (ref 0.0–1.0)
pH: 5.5 (ref 5.0–8.0)

## 2014-05-10 LAB — COMPREHENSIVE METABOLIC PANEL
ALT: 33 U/L (ref 0–53)
AST: 30 U/L (ref 0–37)
Albumin: 3.3 g/dL — ABNORMAL LOW (ref 3.5–5.2)
Alkaline Phosphatase: 95 U/L (ref 39–117)
Anion gap: 15 (ref 5–15)
BUN: 24 mg/dL — ABNORMAL HIGH (ref 6–23)
CO2: 23 mEq/L (ref 19–32)
Calcium: 9.7 mg/dL (ref 8.4–10.5)
Chloride: 102 mEq/L (ref 96–112)
Creatinine, Ser: 1.33 mg/dL (ref 0.50–1.35)
GFR calc Af Amer: 69 mL/min — ABNORMAL LOW (ref >90)
GFR calc non Af Amer: 60 mL/min — ABNORMAL LOW (ref >90)
Glucose, Bld: 294 mg/dL — ABNORMAL HIGH (ref 70–99)
Potassium: 4.1 mEq/L (ref 3.7–5.3)
Sodium: 140 mEq/L (ref 137–147)
Total Bilirubin: <0.2 mg/dL — ABNORMAL LOW (ref 0.3–1.2)
Total Protein: 7.9 g/dL (ref 6.0–8.3)

## 2014-05-10 LAB — URINE MICROSCOPIC-ADD ON

## 2014-05-10 MED ORDER — SODIUM CHLORIDE 0.9 % IV SOLN
INTRAVENOUS | Status: DC
  Administered 2014-05-10: 250 mL via INTRAVENOUS

## 2014-05-10 MED ORDER — SODIUM CHLORIDE 0.9 % IV SOLN
115.0000 mg | INTRAVENOUS | Status: DC
  Administered 2014-05-10: 115 mg via INTRAVENOUS
  Filled 2014-05-10: qty 23

## 2014-05-20 ENCOUNTER — Telehealth: Payer: Self-pay | Admitting: Interventional Cardiology

## 2014-05-20 NOTE — Telephone Encounter (Signed)
New Msg    Pt checking to see if paperwork was completed that he left back in November.   Please call at [phone].

## 2014-05-24 ENCOUNTER — Encounter (HOSPITAL_COMMUNITY): Payer: Self-pay

## 2014-05-24 ENCOUNTER — Encounter (HOSPITAL_COMMUNITY)
Admission: RE | Admit: 2014-05-24 | Discharge: 2014-05-24 | Disposition: A | Discharge status: Home / Self Care | Payer: Medicare Other | Source: Ambulatory Visit | Attending: Nephrology | Admitting: Nephrology

## 2014-05-24 DIAGNOSIS — E779 Disorder of glycoprotein metabolism, unspecified: Secondary | ICD-10-CM | POA: Diagnosis not present

## 2014-05-24 MED ORDER — SODIUM CHLORIDE 0.9 % IV SOLN
115.0000 mg | INTRAVENOUS | Status: DC
  Administered 2014-05-24: 115 mg via INTRAVENOUS
  Filled 2014-05-24: qty 23

## 2014-05-24 MED ORDER — SODIUM CHLORIDE 0.9 % IV SOLN
INTRAVENOUS | Status: DC
  Administered 2014-05-24: 20 mL/h via INTRAVENOUS

## 2014-05-27 ENCOUNTER — Encounter: Payer: Self-pay | Admitting: Internal Medicine

## 2014-05-27 ENCOUNTER — Ambulatory Visit (INDEPENDENT_AMBULATORY_CARE_PROVIDER_SITE_OTHER): Payer: Medicare Other | Admitting: Internal Medicine

## 2014-05-27 VITALS — BP 120/88 | HR 61 | Ht 70.0 in | Wt 271.8 lb

## 2014-05-27 DIAGNOSIS — I480 Paroxysmal atrial fibrillation: Secondary | ICD-10-CM | POA: Diagnosis not present

## 2014-05-27 DIAGNOSIS — I5032 Chronic diastolic (congestive) heart failure: Secondary | ICD-10-CM

## 2014-05-27 DIAGNOSIS — I10 Essential (primary) hypertension: Secondary | ICD-10-CM | POA: Diagnosis not present

## 2014-05-27 DIAGNOSIS — R0602 Shortness of breath: Secondary | ICD-10-CM | POA: Diagnosis not present

## 2014-05-27 DIAGNOSIS — I441 Atrioventricular block, second degree: Secondary | ICD-10-CM | POA: Diagnosis not present

## 2014-05-27 LAB — MDC_IDC_ENUM_SESS_TYPE_INCLINIC
Battery Remaining Longevity: 94.8 mo
Battery Voltage: 2.93 V
Brady Statistic RA Percent Paced: 43 %
Brady Statistic RV Percent Paced: 76 %
Date Time Interrogation Session: 20151221100320
Implantable Pulse Generator Model: 2210
Implantable Pulse Generator Serial Number: 7267950
Lead Channel Impedance Value: 375 Ohm
Lead Channel Impedance Value: 425 Ohm
Lead Channel Pacing Threshold Amplitude: 0.625 V
Lead Channel Pacing Threshold Amplitude: 1.125 V
Lead Channel Pacing Threshold Pulse Width: 0.5 ms
Lead Channel Pacing Threshold Pulse Width: 0.5 ms
Lead Channel Sensing Intrinsic Amplitude: 5 mV
Lead Channel Sensing Intrinsic Amplitude: 5.8 mV
Lead Channel Setting Pacing Amplitude: 1.25 V
Lead Channel Setting Pacing Amplitude: 1.625
Lead Channel Setting Pacing Pulse Width: 0.5 ms
Lead Channel Setting Sensing Sensitivity: 2 mV

## 2014-05-27 NOTE — Patient Instructions (Signed)
Your physician wants you to follow-up in:  months with Dr. Vallery Ridge will receive a reminder letter in the mail two months in advance. If you don't receive a letter, please call our office to schedule the follow-up appointment.    Remote monitoring is used to monitor your Pacemaker or ICD from home. This monitoring reduces the number of office visits required to check your device to one time per year. It allows [place] to keep an eye on the functioning of your device to ensure it is working properly. You are scheduled for a device check from home on 08/26/14. You may send your transmission at any time that day. If you have a wireless device, the transmission will be sent automatically. After your physician reviews your transmission, you will receive a postcard with your next transmission date.

## 2014-05-27 NOTE — Progress Notes (Signed)
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 male who presents today for routine electrophysiology followup.  Since last being seen in our clinic, the patient reports doing very well.  He has chronic and stable exertional SOB. Today, he denies symptoms of palpitations, chest pain, dizziness, presyncope, or syncope.  His edema is unchanged.  The patient is otherwise without complaint today.   Past Medical History  Diagnosis Date  . Second degree Mobitz II AV block     with syncope, s/p PPM  . Hypertension   . Bifascicular block   . Diabetes mellitus   . Pacemaker     02/12/11  . Lymphedema of lower extremity LEFT  >  RIGHT    USES PUMP AT HOME-- DOES NOT WEAR HOSE  . Short of breath on exertion   . History of cellulitis of skin with lymphangitis LEFT LEG  . Diastolic heart failure secondary to hypertrophic cardiomyopathy CARDIOLOGIST-- DR Daneen Schick  . Gout, arthritis 11/2011    bil feet. right worse  . Seasonal asthma NO INHALERS  . Fall 07/2013  . Edema 07/2013  . CHF (congestive heart failure) 2010  . Fabry's disease RENAL AND CARDIAC INVOVLEMENT    FOLLOWED DR COLADOANTO  . Chronic renal insufficiency    Past Surgical History  Procedure Laterality Date  . Pacemaker insertion  02/12/11    SJM implanted by Dr Rayann Heman for Mobitz II second degree AV block and syncope  . Cardiac catheterization  03-12-2011  DR Daneen Schick    HYPERTROPHIC CARDIOMYOPATHY WITH LV CAVITY  APPEARANCE CONSISTANT WITH SIGNIFICANT APICAL HYPERTROPHY/ NORMAL LVSF / EF 55%/ EVIDENCE OF DIASTOLIC DYSFUNCTION WITH EDP OF 23-103mmHg AFTER A-WAVE/ NORMAL CORONARY ARTERIES  . Umbilical hernia repair  03/22/2012    Procedure: HERNIA REPAIR UMBILICAL ADULT;  Surgeon: Leighton Ruff, MD;  Location: [facility];  Service: General;  Laterality: N/A;  Umbilical Hernia Repair   . Hernia repair  0000000    Umbilical Hernia Repair  . Irrigation and debridement abscess N/A 07/27/2013   Procedure: IRRIGATION AND DEBRIDEMENT OF SKIN, SOFT TISSUE AND MUSCLES OF UPPER BACK (11X19X4cm) WITH 10 BLADE AND PULSATILE LAVAGE ;  Surgeon: Gayland Curry, MD;  Location: [facility];  Service: General;  Laterality: N/A;  . Cyst removed from back  07/2013    Current Outpatient Prescriptions  Medication Sig Dispense Refill  . acetaminophen (TYLENOL) 500 MG tablet Only taking 1 hour before infusion.  AS premed for Fabrazyme    . Agalsidase beta (FABRAZYME IV) Inject into the vein every 14 (fourteen) days.     . ALBUTEROL SULFATE HFA IN Inhale 1 puff into the lungs daily as needed (shortness of breath or wheezing).     [name] Kitchen allopurinol (ZYLOPRIM) 300 MG tablet Take 300 mg by mouth daily.    [name] Kitchen aspirin 81 MG tablet Take 81 mg by mouth daily.     . colchicine 0.6 MG tablet Take 0.6 mg by mouth daily.    [name] Kitchen eplerenone (INSPRA) 50 MG tablet Take 50 mg by mouth daily.    . fexofenadine (ALLEGRA) 180 MG tablet Take 180 mg by mouth every 14 (fourteen) days.     [name] Kitchen glyBURIDE (DIABETA) 5 MG tablet Take 5 mg by mouth daily with breakfast.    . losartan-hydrochlorothiazide (HYZAAR) 50-12.5 MG per tablet Take 1 tablet by mouth daily.    . [DISCONTINUED] furosemide (LASIX) 20 MG tablet Take 80 mg by mouth daily.  No current facility-administered medications for this visit.   ROS- all systems are reviewed and negative except as per HPI above  Physical Exam: Filed Vitals:   05/27/14 0906  BP: 120/88  Pulse: 61  Height: 5\' 10"  (1.778 m)  Weight: 271 lb 12.8 oz (123.288 kg)    GEN- The patient is well appearing, alert and oriented x 3 today.   Head- normocephalic, atraumatic Eyes-  Sclera clear, conjunctiva pink Ears- hearing intact Oropharynx- clear Lungs- Clear to ausculation bilaterally, normal work of breathing Chest- pacemaker pocket is well healed Heart- Regular rate and rhythm, no murmurs, rubs or gallops, PMI not laterally displaced GI- soft, NT, ND, + BS Extremities- no clubbing, cyanosis, +2  stable edema  Pacemaker interrogation- reviewed in detail today,  See PACEART report  Assessment and Plan:  Mobitz (type) II atrioventricular block  Normal pacemaker function  See Pace Art report  He atrial paces 48%.  I will increase lower pacing rate to 70 and add rate response today to see if his SOB improves. I have turned VIP off and fixed AV at 200/180 to promote a more physiologic av conduction  Hypertension   Stable  No change required today  He has occasional postural dizziness, likely due to hctz No changes today  Dyspnea   He continues to struggle with exertional SOB.  I have reviewed Dr Darliss Ridgel notes as well as prior echo today. I have spent a significant amount of time adjusting his pacemaker in the office today to treat his SOB and have had him perform as walk test in the office today to fine tune pacing changes Pacemaker adjusted as above Consider CPX testing to better define SOB if not improved  Follow-up with Dr Leonia Reader checks every 3 months Return in 1 year in EP device clinic

## 2014-06-11 ENCOUNTER — Encounter (HOSPITAL_COMMUNITY): Payer: Self-pay

## 2014-06-11 ENCOUNTER — Encounter (HOSPITAL_COMMUNITY)
Admission: RE | Admit: 2014-06-11 | Discharge: 2014-06-11 | Disposition: A | Discharge status: Home / Self Care | Payer: Medicare Other | Source: Ambulatory Visit | Attending: Nephrology | Admitting: Nephrology

## 2014-06-11 DIAGNOSIS — E779 Disorder of glycoprotein metabolism, unspecified: Secondary | ICD-10-CM | POA: Insufficient documentation

## 2014-06-11 LAB — COMPREHENSIVE METABOLIC PANEL
ALT: 33 U/L (ref 0–53)
AST: 37 U/L (ref 0–37)
Albumin: 3.5 g/dL (ref 3.5–5.2)
Alkaline Phosphatase: 82 U/L (ref 39–117)
Anion gap: 8 (ref 5–15)
BUN: 17 mg/dL (ref 6–23)
CO2: 26 mmol/L (ref 19–32)
Calcium: 9.1 mg/dL (ref 8.4–10.5)
Chloride: 106 mEq/L (ref 96–112)
Creatinine, Ser: 1.36 mg/dL — ABNORMAL HIGH (ref 0.50–1.35)
GFR calc Af Amer: 67 mL/min — ABNORMAL LOW (ref >90)
GFR calc non Af Amer: 58 mL/min — ABNORMAL LOW (ref >90)
Glucose, Bld: 225 mg/dL — ABNORMAL HIGH (ref 70–99)
Potassium: 4.5 mmol/L (ref 3.5–5.1)
Sodium: 140 mmol/L (ref 135–145)
Total Bilirubin: 0.8 mg/dL (ref 0.3–1.2)
Total Protein: 8.2 g/dL (ref 6.0–8.3)

## 2014-06-11 LAB — URINE MICROSCOPIC-ADD ON

## 2014-06-11 LAB — URINALYSIS, ROUTINE W REFLEX MICROSCOPIC
Bilirubin Urine: NEGATIVE
Glucose, UA: NEGATIVE mg/dL
Hgb urine dipstick: NEGATIVE
Ketones, ur: NEGATIVE mg/dL
Leukocytes, UA: NEGATIVE
Nitrite: NEGATIVE
Protein, ur: 100 mg/dL — AB
Specific Gravity, Urine: 1.011 (ref 1.005–1.030)
Urobilinogen, UA: 0.2 mg/dL (ref 0.0–1.0)
pH: 5 (ref 5.0–8.0)

## 2014-06-11 LAB — PHOSPHORUS: Phosphorus: 4.5 mg/dL (ref 2.3–4.6)

## 2014-06-11 MED ORDER — SODIUM CHLORIDE 0.9 % IV SOLN
INTRAVENOUS | Status: DC
  Administered 2014-06-11: 09:00:00 via INTRAVENOUS

## 2014-06-11 MED ORDER — SODIUM CHLORIDE 0.9 % IV SOLN
115.0000 mg | INTRAVENOUS | Status: DC
  Administered 2014-06-11: 115 mg via INTRAVENOUS
  Filled 2014-06-11: qty 23

## 2014-06-11 NOTE — Telephone Encounter (Signed)
Pt awae form is completed.pt rqst form be mailed to him.

## 2014-06-28 ENCOUNTER — Encounter (HOSPITAL_COMMUNITY): Payer: Self-pay

## 2014-06-28 ENCOUNTER — Other Ambulatory Visit (HOSPITAL_COMMUNITY): Payer: Self-pay | Admitting: Nephrology

## 2014-06-28 ENCOUNTER — Encounter (HOSPITAL_COMMUNITY)
Admission: RE | Admit: 2014-06-28 | Discharge: 2014-06-28 | Disposition: A | Discharge status: Home / Self Care | Payer: Medicare Other | Source: Ambulatory Visit | Attending: Nephrology | Admitting: Nephrology

## 2014-06-28 DIAGNOSIS — E779 Disorder of glycoprotein metabolism, unspecified: Secondary | ICD-10-CM | POA: Diagnosis not present

## 2014-06-28 MED ORDER — SODIUM CHLORIDE 0.9 % IV SOLN
115.0000 mg | INTRAVENOUS | Status: DC
  Administered 2014-06-28: 115 mg via INTRAVENOUS
  Filled 2014-06-28: qty 23

## 2014-06-28 MED ORDER — SODIUM CHLORIDE 0.9 % IV SOLN
INTRAVENOUS | Status: DC
  Administered 2014-06-28: 10:00:00 via INTRAVENOUS

## 2014-06-28 NOTE — Discharge Instructions (Signed)
Agalsidase Beta injection What is this medicine? AGALSIDASE BETA is used to replace an enzyme that is missing in patients with Fabry disease. It is not a cure. This medicine may be used for other purposes; ask your health care provider or pharmacist if you have questions. COMMON BRAND NAME(S): Fabrazyme What should I tell my health care provider before I take this medicine? They need to know if you have any of these conditions: -heart disease -an unusual or allergic reaction to agalsidase beta, mannitol, other medicines, foods, dyes, or preservatives -pregnant or trying to get pregnant -breast-feeding How should I use this medicine? This medicine is for infusion into a vein. It is given by a health care professional in a hospital or clinic setting. Talk to your pediatrician regarding the use of this medicine in children. Special care may be needed. Overdosage: If you think you have taken too much of this medicine contact a poison control center or emergency room at once. NOTE: This medicine is only for you. Do not share this medicine with others. What if I miss a dose? It is important not to miss your dose. Call your doctor or health care professional if you are unable to keep an appointment. What may interact with this medicine? -amiodarone -chloroquine -gentamicin -hydroxychloroquine -monobenzone This list may not describe all possible interactions. Give your health care provider a list of all the medicines, herbs, non-prescription drugs, or dietary supplements you use. Also tell them if you smoke, drink alcohol, or use illegal drugs. Some items may interact with your medicine. What should I watch for while using this medicine? Visit your doctor or health care professional for regular checks on your progress. Tell your doctor or healthcare professional if your symptoms do not start to get better or if they get worse. There is a registry for patients with Fabry disease. The registry is  used to gather information about the disease and its effects. Talk to your health care provider if you would like to join the registry. What side effects may I notice from receiving this medicine? Side effects that you should report to your doctor or health care professional as soon as possible: -allergic reactions like skin rash, itching or hives, swelling of the face, lips, or tongue -breathing problems -chest pain, tightness -depression -dizziness -fast, irregular heart beat -swelling of the arms or legs Side effects that usually do not require medical attention (report to your doctor or health care professional if they continue or are bothersome): -aches or pains -anxiety -fever or chills at the time of injection -headache -nausea, vomiting -stomach pain, upset This list may not describe all possible side effects. Call your doctor for medical advice about side effects. You may report side effects to FDA at 1-800-FDA-1088. Where should I keep my medicine? This drug is given in a hospital or clinic and will not be stored at home. NOTE: This sheet is a summary. It may not cover all possible information. If you have questions about this medicine, talk to your doctor, pharmacist, or health care provider.  2015, Elsevier/Gold Standard. (2006-01-31 12:25:00)

## 2014-07-12 ENCOUNTER — Other Ambulatory Visit: Payer: Self-pay

## 2014-07-12 ENCOUNTER — Encounter (HOSPITAL_COMMUNITY)
Admission: RE | Admit: 2014-07-12 | Discharge: 2014-07-12 | Disposition: A | Discharge status: Home / Self Care | Payer: Medicare Other | Source: Ambulatory Visit | Attending: Nephrology | Admitting: Nephrology

## 2014-07-12 ENCOUNTER — Encounter (HOSPITAL_COMMUNITY): Payer: Self-pay

## 2014-07-12 ENCOUNTER — Inpatient Hospital Stay (HOSPITAL_COMMUNITY)
Admission: EM | Admit: 2014-07-12 | Discharge: 2014-07-14 | DRG: 638 | Disposition: A | Discharge status: Home / Self Care | Payer: Medicare Other | Attending: Family Medicine | Admitting: Family Medicine

## 2014-07-12 ENCOUNTER — Emergency Department (HOSPITAL_COMMUNITY): Payer: Medicare Other

## 2014-07-12 DIAGNOSIS — R110 Nausea: Secondary | ICD-10-CM | POA: Diagnosis not present

## 2014-07-12 DIAGNOSIS — I5032 Chronic diastolic (congestive) heart failure: Secondary | ICD-10-CM | POA: Diagnosis not present

## 2014-07-12 DIAGNOSIS — Z950 Presence of cardiac pacemaker: Secondary | ICD-10-CM | POA: Diagnosis not present

## 2014-07-12 DIAGNOSIS — R05 Cough: Secondary | ICD-10-CM | POA: Diagnosis not present

## 2014-07-12 DIAGNOSIS — N189 Chronic kidney disease, unspecified: Secondary | ICD-10-CM | POA: Diagnosis present

## 2014-07-12 DIAGNOSIS — I129 Hypertensive chronic kidney disease with stage 1 through stage 4 chronic kidney disease, or unspecified chronic kidney disease: Secondary | ICD-10-CM | POA: Diagnosis present

## 2014-07-12 DIAGNOSIS — R531 Weakness: Secondary | ICD-10-CM | POA: Diagnosis not present

## 2014-07-12 DIAGNOSIS — E1100 Type 2 diabetes mellitus with hyperosmolarity without nonketotic hyperglycemic-hyperosmolar coma (NKHHC): Principal | ICD-10-CM | POA: Diagnosis present

## 2014-07-12 DIAGNOSIS — R42 Dizziness and giddiness: Secondary | ICD-10-CM | POA: Diagnosis not present

## 2014-07-12 DIAGNOSIS — R739 Hyperglycemia, unspecified: Secondary | ICD-10-CM | POA: Diagnosis present

## 2014-07-12 DIAGNOSIS — Z79899 Other long term (current) drug therapy: Secondary | ICD-10-CM | POA: Diagnosis not present

## 2014-07-12 DIAGNOSIS — E7521 Fabry (-Anderson) disease: Secondary | ICD-10-CM | POA: Diagnosis present

## 2014-07-12 DIAGNOSIS — J449 Chronic obstructive pulmonary disease, unspecified: Secondary | ICD-10-CM | POA: Diagnosis present

## 2014-07-12 DIAGNOSIS — N179 Acute kidney failure, unspecified: Secondary | ICD-10-CM | POA: Diagnosis present

## 2014-07-12 DIAGNOSIS — E1101 Type 2 diabetes mellitus with hyperosmolarity with coma: Secondary | ICD-10-CM | POA: Diagnosis not present

## 2014-07-12 DIAGNOSIS — I480 Paroxysmal atrial fibrillation: Secondary | ICD-10-CM | POA: Diagnosis present

## 2014-07-12 DIAGNOSIS — R059 Cough, unspecified: Secondary | ICD-10-CM

## 2014-07-12 DIAGNOSIS — E1165 Type 2 diabetes mellitus with hyperglycemia: Secondary | ICD-10-CM | POA: Diagnosis not present

## 2014-07-12 DIAGNOSIS — E162 Hypoglycemia, unspecified: Secondary | ICD-10-CM | POA: Diagnosis not present

## 2014-07-12 LAB — URINALYSIS, ROUTINE W REFLEX MICROSCOPIC
Bilirubin Urine: NEGATIVE
Glucose, UA: >1000 mg/dL — AB
Hgb urine dipstick: NEGATIVE
Ketones, ur: NEGATIVE mg/dL
Leukocytes, UA: NEGATIVE
Nitrite: NEGATIVE
Protein, ur: 30 mg/dL — AB
Specific Gravity, Urine: 1.02 (ref 1.005–1.030)
Urobilinogen, UA: 0.2 mg/dL (ref 0.0–1.0)
pH: 5.5 (ref 5.0–8.0)

## 2014-07-12 LAB — CBG MONITORING, ED
Glucose-Capillary: >600 mg/dL (ref 70–99)
Glucose-Capillary: >600 mg/dL (ref 70–99)

## 2014-07-12 LAB — BLOOD GAS, VENOUS
Acid-base deficit: 2.5 mmol/L — ABNORMAL HIGH (ref 0.0–2.0)
Bicarbonate: 22.5 mEq/L (ref 20.0–24.0)
O2 Saturation: 86.9 %
Patient temperature: 98.6
TCO2: 18.4 mmol/L (ref 0–100)
pCO2, Ven: 41.3 mmHg — ABNORMAL LOW (ref 45.0–50.0)
pH, Ven: 7.355 — ABNORMAL HIGH (ref 7.250–7.300)
pO2, Ven: 56.8 mmHg — ABNORMAL HIGH (ref 30.0–45.0)

## 2014-07-12 LAB — BASIC METABOLIC PANEL
Anion gap: 13 (ref 5–15)
BUN: 38 mg/dL — ABNORMAL HIGH (ref 6–23)
CO2: 23 mmol/L (ref 19–32)
Calcium: 9.3 mg/dL (ref 8.4–10.5)
Chloride: 98 mmol/L (ref 96–112)
Creatinine, Ser: 2.08 mg/dL — ABNORMAL HIGH (ref 0.50–1.35)
GFR calc Af Amer: 40 mL/min — ABNORMAL LOW (ref >90)
GFR calc non Af Amer: 35 mL/min — ABNORMAL LOW (ref >90)
Glucose, Bld: 503 mg/dL — ABNORMAL HIGH (ref 70–99)
Potassium: 4 mmol/L (ref 3.5–5.1)
Sodium: 134 mmol/L — ABNORMAL LOW (ref 135–145)

## 2014-07-12 LAB — GLUCOSE, CAPILLARY
Glucose-Capillary: >600 mg/dL (ref 70–99)
Glucose-Capillary: >600 mg/dL (ref 70–99)
Glucose-Capillary: >600 mg/dL (ref 70–99)
Glucose-Capillary: 494 mg/dL — ABNORMAL HIGH (ref 70–99)
Glucose-Capillary: 383 mg/dL — ABNORMAL HIGH (ref 70–99)
Glucose-Capillary: 237 mg/dL — ABNORMAL HIGH (ref 70–99)
Glucose-Capillary: 146 mg/dL — ABNORMAL HIGH (ref 70–99)
Glucose-Capillary: 139 mg/dL — ABNORMAL HIGH (ref 70–99)
Glucose-Capillary: 133 mg/dL — ABNORMAL HIGH (ref 70–99)
Glucose-Capillary: 198 mg/dL — ABNORMAL HIGH (ref 70–99)

## 2014-07-12 LAB — URINE MICROSCOPIC-ADD ON

## 2014-07-12 LAB — COMPREHENSIVE METABOLIC PANEL
ALT: 38 U/L (ref 0–53)
AST: 31 U/L (ref 0–37)
Albumin: 3.4 g/dL — ABNORMAL LOW (ref 3.5–5.2)
Alkaline Phosphatase: 117 U/L (ref 39–117)
Anion gap: 14 (ref 5–15)
BUN: 37 mg/dL — ABNORMAL HIGH (ref 6–23)
CO2: 19 mmol/L (ref 19–32)
Calcium: 9.1 mg/dL (ref 8.4–10.5)
Chloride: 92 mmol/L — ABNORMAL LOW (ref 96–112)
Creatinine, Ser: 2.11 mg/dL — ABNORMAL HIGH (ref 0.50–1.35)
GFR calc Af Amer: 40 mL/min — ABNORMAL LOW (ref >90)
GFR calc non Af Amer: 34 mL/min — ABNORMAL LOW (ref >90)
Glucose, Bld: 777 mg/dL (ref 70–99)
Potassium: 5.3 mmol/L — ABNORMAL HIGH (ref 3.5–5.1)
Sodium: 125 mmol/L — ABNORMAL LOW (ref 135–145)
Total Bilirubin: 1.1 mg/dL (ref 0.3–1.2)
Total Protein: 7.8 g/dL (ref 6.0–8.3)

## 2014-07-12 LAB — CBC
HCT: 49.8 % (ref 39.0–52.0)
Hemoglobin: 17.2 g/dL — ABNORMAL HIGH (ref 13.0–17.0)
MCH: 30.1 pg (ref 26.0–34.0)
MCHC: 34.5 g/dL (ref 30.0–36.0)
MCV: 87.1 fL (ref 78.0–100.0)
Platelets: 216 10*3/uL (ref 150–400)
RBC: 5.72 MIL/uL (ref 4.22–5.81)
RDW: 12.6 % (ref 11.5–15.5)
WBC: 8.9 10*3/uL (ref 4.0–10.5)

## 2014-07-12 LAB — MRSA PCR SCREENING: MRSA by PCR: NEGATIVE

## 2014-07-12 LAB — GLUCOSE, RANDOM: Glucose, Bld: 774 mg/dL (ref 70–99)

## 2014-07-12 MED ORDER — SODIUM CHLORIDE 0.9 % IV SOLN
INTRAVENOUS | Status: DC
  Administered 2014-07-12: 23:00:00 via INTRAVENOUS

## 2014-07-12 MED ORDER — INSULIN ASPART 100 UNIT/ML ~~LOC~~ SOLN
0.0000 [IU] | Freq: Every day | SUBCUTANEOUS | Status: DC
Start: 2014-07-12 — End: 2014-07-14
  Administered 2014-07-13: 2 [IU] via SUBCUTANEOUS

## 2014-07-12 MED ORDER — COLCHICINE 0.6 MG PO TABS
0.6000 mg | ORAL_TABLET | Freq: Every day | ORAL | Status: DC
  Administered 2014-07-13 – 2014-07-14 (×2): 0.6 mg via ORAL
  Filled 2014-07-12 (×2): qty 1

## 2014-07-12 MED ORDER — LORATADINE 10 MG PO TABS
10.0000 mg | ORAL_TABLET | Freq: Every day | ORAL | Status: DC
  Administered 2014-07-14: 10 mg via ORAL
  Filled 2014-07-12 (×2): qty 1

## 2014-07-12 MED ORDER — ONDANSETRON HCL 4 MG/2ML IJ SOLN: 4.0000 mg | Freq: Four times a day (QID) | INTRAMUSCULAR | Status: DC | PRN

## 2014-07-12 MED ORDER — DEXTROSE-NACL 5-0.45 % IV SOLN
INTRAVENOUS | Status: DC
  Administered 2014-07-12: 17:00:00 via INTRAVENOUS

## 2014-07-12 MED ORDER — ASPIRIN 81 MG PO CHEW
81.0000 mg | CHEWABLE_TABLET | Freq: Every day | ORAL | Status: DC
  Administered 2014-07-13 – 2014-07-14 (×2): 81 mg via ORAL
  Filled 2014-07-12 (×2): qty 1

## 2014-07-12 MED ORDER — ALLOPURINOL 300 MG PO TABS
300.0000 mg | ORAL_TABLET | Freq: Every day | ORAL | Status: DC
  Administered 2014-07-13 – 2014-07-14 (×2): 300 mg via ORAL
  Filled 2014-07-12: qty 1
  Filled 2014-07-12: qty 3

## 2014-07-12 MED ORDER — SODIUM CHLORIDE 0.9 % IV SOLN: INTRAVENOUS | Status: DC

## 2014-07-12 MED ORDER — HYDROCODONE-ACETAMINOPHEN 5-325 MG PO TABS: 1.0000 | ORAL_TABLET | ORAL | Status: DC | PRN

## 2014-07-12 MED ORDER — INSULIN ASPART 100 UNIT/ML ~~LOC~~ SOLN
0.0000 [IU] | Freq: Three times a day (TID) | SUBCUTANEOUS | Status: DC
  Administered 2014-07-13: 11 [IU] via SUBCUTANEOUS
  Administered 2014-07-13: 7 [IU] via SUBCUTANEOUS
  Administered 2014-07-13: 11 [IU] via SUBCUTANEOUS
  Administered 2014-07-14: 7 [IU] via SUBCUTANEOUS
  Administered 2014-07-14: 15 [IU] via SUBCUTANEOUS

## 2014-07-12 MED ORDER — SODIUM CHLORIDE 0.9 % IV SOLN
INTRAVENOUS | Status: DC
  Administered 2014-07-12: 14:00:00 via INTRAVENOUS

## 2014-07-12 MED ORDER — INSULIN GLARGINE 100 UNIT/ML ~~LOC~~ SOLN
20.0000 [IU] | Freq: Once | SUBCUTANEOUS | Status: AC
  Administered 2014-07-12: 20 [IU] via SUBCUTANEOUS
  Filled 2014-07-12: qty 0.2

## 2014-07-12 MED ORDER — ALBUTEROL SULFATE (2.5 MG/3ML) 0.083% IN NEBU: 2.5000 mg | INHALATION_SOLUTION | Freq: Four times a day (QID) | RESPIRATORY_TRACT | Status: DC | PRN

## 2014-07-12 MED ORDER — HEPARIN SODIUM (PORCINE) 5000 UNIT/ML IJ SOLN
5000.0000 [IU] | Freq: Three times a day (TID) | INTRAMUSCULAR | Status: DC
  Administered 2014-07-12 – 2014-07-14 (×5): 5000 [IU] via SUBCUTANEOUS
  Filled 2014-07-12 (×8): qty 1

## 2014-07-12 MED ORDER — SODIUM CHLORIDE 0.9 % IV SOLN
INTRAVENOUS | Status: DC
  Administered 2014-07-12: 14:00:00 via INTRAVENOUS
  Filled 2014-07-12: qty 2.5

## 2014-07-12 MED ORDER — SODIUM CHLORIDE 0.9 % IV SOLN
INTRAVENOUS | Status: DC
  Administered 2014-07-12: 5.4 [IU]/h via INTRAVENOUS
  Filled 2014-07-12: qty 2.5

## 2014-07-12 MED ORDER — INSULIN REGULAR BOLUS VIA INFUSION
0.0000 [IU] | Freq: Three times a day (TID) | INTRAVENOUS | Status: DC
  Filled 2014-07-12: qty 10

## 2014-07-12 MED ORDER — ONDANSETRON HCL 4 MG PO TABS
4.0000 mg | ORAL_TABLET | Freq: Four times a day (QID) | ORAL | Status: DC | PRN
Start: 2014-07-12 — End: 2014-07-14

## 2014-07-12 MED ORDER — INSULIN GLARGINE 100 UNIT/ML ~~LOC~~ SOLN
20.0000 [IU] | Freq: Every day | SUBCUTANEOUS | Status: DC
  Administered 2014-07-13: 20 [IU] via SUBCUTANEOUS
  Filled 2014-07-12 (×2): qty 0.2

## 2014-07-12 MED ORDER — INSULIN GLARGINE 100 UNIT/ML ~~LOC~~ SOLN: 20.0000 [IU] | Freq: Every day | SUBCUTANEOUS | Status: DC

## 2014-07-12 MED ORDER — DEXTROSE 50 % IV SOLN: 25.0000 mL | INTRAVENOUS | Status: DC | PRN

## 2014-07-12 MED ORDER — SPIRONOLACTONE 25 MG PO TABS
25.0000 mg | ORAL_TABLET | Freq: Every day | ORAL | Status: DC
  Administered 2014-07-14: 25 mg via ORAL
  Filled 2014-07-12 (×2): qty 1

## 2014-07-12 MED ORDER — DEXTROSE-NACL 5-0.45 % IV SOLN: INTRAVENOUS | Status: DC

## 2014-07-12 MED ORDER — SODIUM CHLORIDE 0.9 % IV BOLUS (SEPSIS)
500.0000 mL | Freq: Once | INTRAVENOUS | Status: AC
Start: 2014-07-12 — End: 2014-07-12
  Administered 2014-07-12: 500 mL via INTRAVENOUS

## 2014-07-12 MED ORDER — SODIUM CHLORIDE 0.9 % IJ SOLN
3.0000 mL | Freq: Two times a day (BID) | INTRAMUSCULAR | Status: DC
  Administered 2014-07-13 – 2014-07-14 (×2): 3 mL via INTRAVENOUS

## 2014-07-12 NOTE — ED Notes (Signed)
Pt given ice water per RN

## 2014-07-12 NOTE — ED Provider Notes (Signed)
CSN: [id number]     Arrival date & time 07/12/14  0921 History   First MD Initiated Contact with Patient 07/12/14 [phone]     Chief Complaint  Patient presents with  . Hyperglycemia     (Consider location/radiation/quality/duration/timing/severity/associated sxs/prior Treatment) Patient is a 54 y.o. male presenting with hyperglycemia. The history is provided by the patient.  Hyperglycemia Blood sugar level PTA:  700's Severity:  Moderate Onset quality:  Gradual Duration:  5 days Timing:  Constant Progression:  Worsening Chronicity:  Recurrent Diabetes status:  Controlled with oral medications and controlled with diet Current diabetic therapy:  Glyburide Relieved by:  Nothing Ineffective treatments:  None tried Associated symptoms: no abdominal pain, no chest pain, no dysuria, no fever, no nausea, no shortness of breath and no vomiting     Past Medical History  Diagnosis Date  . Second degree Mobitz II AV block     with syncope, s/p PPM  . Hypertension   . Bifascicular block   . Diabetes mellitus   . Pacemaker     02/12/11  . Lymphedema of lower extremity LEFT  >  RIGHT    USES PUMP AT HOME-- DOES NOT WEAR HOSE  . Short of breath on exertion   . History of cellulitis of skin with lymphangitis LEFT LEG  . Diastolic heart failure secondary to hypertrophic cardiomyopathy CARDIOLOGIST-- DR Daneen Schick  . Gout, arthritis 11/2011    bil feet. right worse  . Seasonal asthma NO INHALERS  . Fall 07/2013  . Edema 07/2013  . CHF (congestive heart failure) 2010  . Fabry's disease RENAL AND CARDIAC INVOVLEMENT    FOLLOWED DR COLADOANTO  . Chronic renal insufficiency    Past Surgical History  Procedure Laterality Date  . Pacemaker insertion  02/12/11    SJM implanted by Dr Rayann Heman for Mobitz II second degree AV block and syncope  . Cardiac catheterization  03-12-2011  DR Daneen Schick    HYPERTROPHIC CARDIOMYOPATHY WITH LV CAVITY  APPEARANCE CONSISTANT WITH SIGNIFICANT APICAL HYPERTROPHY/  NORMAL LVSF / EF 55%/ EVIDENCE OF DIASTOLIC DYSFUNCTION WITH EDP OF 23-7mmHg AFTER A-WAVE/ NORMAL CORONARY ARTERIES  . Umbilical hernia repair  03/22/2012    Procedure: HERNIA REPAIR UMBILICAL ADULT;  Surgeon: Leighton Ruff, MD;  Location: [facility];  Service: General;  Laterality: N/A;  Umbilical Hernia Repair   . Hernia repair  [id number]    Umbilical Hernia Repair  . Irrigation and debridement abscess N/A 07/27/2013    Procedure: IRRIGATION AND DEBRIDEMENT OF SKIN, SOFT TISSUE AND MUSCLES OF UPPER BACK (11X19X4cm) WITH 10 BLADE AND PULSATILE LAVAGE ;  Surgeon: Gayland Curry, MD;  Location: [facility];  Service: General;  Laterality: N/A;  . Cyst removed from back  07/2013   Family History  Problem Relation Age of Onset  . Colon cancer Neg Hx    History  Substance Use Topics  . Smoking status: Never Smoker   . Smokeless tobacco: Never Used  . Alcohol Use: Yes     Comment: OCCASIONAL    Review of Systems  Constitutional: Positive for appetite change. Negative for fever.  HENT: Negative for drooling and rhinorrhea.   Eyes: Negative for pain.  Respiratory: Negative for cough and shortness of breath.   Cardiovascular: Negative for chest pain and leg swelling.  Gastrointestinal: Negative for nausea, vomiting, abdominal pain and diarrhea.  Genitourinary: Negative for dysuria and hematuria.  Musculoskeletal: Negative for gait problem and neck pain.  Skin: Negative for color change.  Neurological: Positive for  weakness (generalized). Negative for numbness and headaches.  Hematological: Negative for adenopathy.  Psychiatric/Behavioral: Negative for behavioral problems.  All other systems reviewed and are negative.     Allergies  Shellfish allergy  Home Medications   Prior to Admission medications   Medication Sig Start Date End Date Taking? Authorizing Provider  acetaminophen (TYLENOL) 500 MG tablet Only taking 1 hour before infusion.  AS premed for Fabrazyme    Historical Provider, MD   Agalsidase beta (FABRAZYME IV) Inject into the vein every 14 (fourteen) days.     Historical Provider, MD  ALBUTEROL SULFATE HFA IN Inhale 1 puff into the lungs daily as needed (shortness of breath or wheezing).     Historical Provider, MD  allopurinol (ZYLOPRIM) 300 MG tablet Take 300 mg by mouth daily.    Historical Provider, MD  aspirin 81 MG tablet Take 81 mg by mouth daily.     Historical Provider, MD  colchicine 0.6 MG tablet Take 0.6 mg by mouth daily.    Historical Provider, MD  eplerenone (INSPRA) 50 MG tablet Take 50 mg by mouth daily.    Historical Provider, MD  fexofenadine (ALLEGRA) 180 MG tablet Take 180 mg by mouth every 14 (fourteen) days.     Historical Provider, MD  glyBURIDE (DIABETA) 5 MG tablet Take 5 mg by mouth daily with breakfast.    Historical Provider, MD  losartan-hydrochlorothiazide (HYZAAR) 50-12.5 MG per tablet Take 1 tablet by mouth daily.    Historical Provider, MD   BP 111/79 mmHg  Pulse 70  Temp(Src) 97.5 F (36.4 C) (Oral)  Resp 18  SpO2 94% Physical Exam  Constitutional: He is oriented to person, place, and time. He appears well-developed and well-nourished.  HENT:  Head: Normocephalic and atraumatic.  Right Ear: External ear normal.  Left Ear: External ear normal.  Nose: Nose normal.  Mouth/Throat: Oropharynx is clear and moist. No oropharyngeal exudate.  Eyes: Conjunctivae and EOM are normal. Pupils are equal, round, and reactive to light.  Neck: Normal range of motion. Neck supple.  Cardiovascular: Normal rate, regular rhythm, normal heart sounds and intact distal pulses.  Exam reveals no gallop and no friction rub.   No murmur heard. Pulmonary/Chest: Effort normal and breath sounds normal. No respiratory distress. He has no wheezes.  Abdominal: Soft. Bowel sounds are normal. He exhibits no distension. There is no tenderness. There is no rebound and no guarding.  Musculoskeletal: Normal range of motion. He exhibits no edema or tenderness.   Neurological: He is alert and oriented to person, place, and time.  Skin: Skin is warm and dry.  Psychiatric: He has a normal mood and affect. His behavior is normal.  Nursing note and vitals reviewed.   ED Course  Procedures (including critical care time) Labs Review Labs Reviewed  CBC - Abnormal; Notable for the following:    Hemoglobin 17.2 (*)    All other components within normal limits  COMPREHENSIVE METABOLIC PANEL - Abnormal; Notable for the following:    Sodium 125 (*)    Potassium 5.3 (*)    Chloride 92 (*)    Glucose, Bld 777 (*)    BUN 37 (*)    Creatinine, Ser 2.11 (*)    Albumin 3.4 (*)    GFR calc non Af Amer 34 (*)    GFR calc Af Amer 40 (*)    All other components within normal limits  URINALYSIS, ROUTINE W REFLEX MICROSCOPIC - Abnormal; Notable for the following:    APPearance CLOUDY (*)  Glucose, UA >1000 (*)    Protein, ur 30 (*)    All other components within normal limits  BLOOD GAS, VENOUS - Abnormal; Notable for the following:    pH, Ven 7.355 (*)    pCO2, Ven 41.3 (*)    pO2, Ven 56.8 (*)    Acid-base deficit 2.5 (*)    All other components within normal limits  GLUCOSE, CAPILLARY - Abnormal; Notable for the following:    Glucose-Capillary >600 (*)    All other components within normal limits  CBG MONITORING, ED - Abnormal; Notable for the following:    Glucose-Capillary >600 (*)    All other components within normal limits  CBG MONITORING, ED - Abnormal; Notable for the following:    Glucose-Capillary >600 (*)    All other components within normal limits  MRSA PCR SCREENING  URINE MICROSCOPIC-ADD ON  BASIC METABOLIC PANEL  BASIC METABOLIC PANEL  BASIC METABOLIC PANEL  BASIC METABOLIC PANEL    Imaging Review Dg Chest 2 View  07/12/2014   CLINICAL DATA:  Hyperglycemia, dizziness, nausea.  EXAM: CHEST  2 VIEW  COMPARISON:  07/30/2013  FINDINGS: Left pacer remains in place, unchanged. Heart is upper limits normal in size. Lungs are clear.  No effusions or pneumothorax. No acute bony abnormality.  IMPRESSION: No active cardiopulmonary disease.   Electronically Signed   By: Rolm Baptise M.D.   On: 07/12/2014 10:24     EKG Interpretation   Date/Time:  Friday July 12 2014 10:02:58 EST Ventricular Rate:  70 PR Interval:  225 QRS Duration: 169 QT Interval:  456 QTC Calculation: 492 R Axis:   -80 Text Interpretation:  Atrial-ventricular dual-paced rhythm No further  analysis attempted due to paced rhythm Confirmed by Mariaeduarda Defranco  MD, [place]  ([id number]) on 07/12/2014 10:25:16 AM      MDM   Final diagnoses:  Cough  Hyperglycemia    9:50 AM 54 y.o. male w hx of DM, CHF s/p pacemaker, Fabrys dis, chronic renal insuff who presents with hyperglycemia. The patient gets an infusion every 2 weeks for his Fabry's disease and today was found to have a blood sugar in the 700s. He did not get his infusion and was sent here for evaluation. He states that he has been under a lot of stress with his mother being in the hospital for stroke. He states that he has had decreased appetite and had generalized weakness. He has had a mild cough. He denies any chest pain. He is afebrile and vital signs are unremarkable here.  The patient was admitted in 2013 with hyperglycemia and was started on insulin. He states that he was on insulin only temporarily for several months and then transitioned back to just glyburide. He states he has done well on just clipped to ride since that time. He notes that he last checked his blood sugar 5 days ago and it was in the 200s.  Will admit to hospitalist.   Pamella Pert, MD 07/12/14 [phone]

## 2014-07-12 NOTE — Progress Notes (Signed)
Normal saline lock started on patient when labwork drawn. Awaiting results from lab for glucose as machine in short stay stated high (>600). Patient resting in recliner in short stay.

## 2014-07-12 NOTE — Progress Notes (Signed)
Patient here for every two week Fabrazyme infusion. States feels "drained" and "worn out". His mother has been in the hospital and he has been worried about her. Last checked his own CBG on Sunday at home and states is was 204. Taking glyburide 5mg  daily and last took this am. States has not had appetite much this week. VSS. States has had dry cough this week. RN took blood sugar on arrival to short stay and CBG machine would not read as states "high". Labs drew per order and awaiting to see what glucose reads and will alert MD at that time. Patient resting comfortably at present.

## 2014-07-12 NOTE — ED Notes (Signed)
Report given to [place], [place]

## 2014-07-12 NOTE — Progress Notes (Signed)
[name] from lab called with glucose of 774 from blood drawn. Rest of CMET was hemolyzed. Called and talked to Dr. Marval Regal and reported lab. He stated to not infuse Fabrazyme today and take patient to the ER now. Spoke to [employer] in ER and taking patient to room 14 now.

## 2014-07-12 NOTE — ED Notes (Signed)
Bed: [id number] Expected date:  Expected time:  Means of arrival:  Comments: Short stay-hyperglycemia

## 2014-07-12 NOTE — Progress Notes (Signed)
UR completed 

## 2014-07-12 NOTE — ED Notes (Signed)
Per md pt allowed to have water to drink

## 2014-07-12 NOTE — ED Notes (Signed)
Pt to xray

## 2014-07-12 NOTE — ED Notes (Signed)
md at bedside  Pt alert and oriented x4. Respirations even and unlabored, bilateral symmetrical rise and fall of chest. Skin warm and dry. In no acute distress. Denies needs.   

## 2014-07-12 NOTE — ED Notes (Signed)
Per susan collins, rn pt has fabreys disease/missing an enzyme affects renal and cardiac system, so pt gets enzyme replacement at short stay every 2 weeks. Dr Boykin Reaper is his renal doctor [phone] or [phone]). Pt arrived to short stay today feel lethargic, glucose 774, hx of diabetes. Pt last checked cbg on Sunday. Pt on PO diabetic med. Similar episode occurred 2 years ago.  phosporus blood work was ordered, cmet was hemolyzed. 22 G R hand.  Stable vitals.  Hx pacemaker, HTN, diabetes, and CHF.

## 2014-07-12 NOTE — H&P (Signed)
[facility] History and Physical  [name] [id number] DOB: Jul 02, 1960 DOA: 07/12/2014  Referring physician: Dr. Aline Brochure PCP: Elizabeth Palau, MD   Chief Complaint: weakness  HPI: [name] is a 54 y.o. male  With history Fabry's disease, hypertension Mobitz type II atrioventricular block, chronic renal failure, type II DM on oral glipizide, and paroxysmal atrial fibrillation. The patient presented to the hospital complaining of generalized weakness. Patient was found to be not his usual while obtaining infusion for treatment of his Fabry's disease and as such was sent to the ED for evaluation. Was found to have elevated blood sugars. Patient also endorses loss of appetite  We were subsequently consulted for medical management of hyperglycemia.   Review of Systems:  Constitutional:  No weight loss, night sweats, Fevers, chills, + fatigue.  HEENT:  No headaches, Difficulty swallowing,Tooth/dental problems,Sore throat,  No sneezing, itching, ear ache, nasal congestion, post nasal drip,  Cardio-vascular:  No chest pain, Orthopnea, PND, swelling in lower extremities, anasarca, dizziness, palpitations  GI:  No heartburn, indigestion, abdominal pain, nausea, vomiting, diarrhea, change in bowel habits, + loss of appetite  Resp:  No shortness of breath with exertion or at rest. No excess mucus, no productive cough, No non-productive cough, No coughing up of blood.No change in color of mucus.No wheezing.No chest wall deformity  Skin:  no rash or lesions.  GU:  no dysuria, change in color of urine, no urgency or frequency. No flank pain.  Musculoskeletal:  No joint pain or swelling. No decreased range of motion. No back pain.  Psych:  No change in mood or affect. No depression or anxiety. No memory loss.   Past Medical History  Diagnosis Date  . Second degree Mobitz II AV block     with syncope, s/p PPM  . Hypertension   . Bifascicular block   . Diabetes  mellitus   . Pacemaker     02/12/11  . Lymphedema of lower extremity LEFT  >  RIGHT    USES PUMP AT HOME-- DOES NOT WEAR HOSE  . Short of breath on exertion   . History of cellulitis of skin with lymphangitis LEFT LEG  . Diastolic heart failure secondary to hypertrophic cardiomyopathy CARDIOLOGIST-- DR Daneen Schick  . Gout, arthritis 11/2011    bil feet. right worse  . Seasonal asthma NO INHALERS  . Fall 07/2013  . Edema 07/2013  . CHF (congestive heart failure) 2010  . Fabry's disease RENAL AND CARDIAC INVOVLEMENT    FOLLOWED DR COLADOANTO  . Chronic renal insufficiency    Past Surgical History  Procedure Laterality Date  . Pacemaker insertion  02/12/11    SJM implanted by Dr Rayann Heman for Mobitz II second degree AV block and syncope  . Cardiac catheterization  03-12-2011  DR Daneen Schick    HYPERTROPHIC CARDIOMYOPATHY WITH LV CAVITY  APPEARANCE CONSISTANT WITH SIGNIFICANT APICAL HYPERTROPHY/ NORMAL LVSF / EF 55%/ EVIDENCE OF DIASTOLIC DYSFUNCTION WITH EDP OF 23-94mmHg AFTER A-WAVE/ NORMAL CORONARY ARTERIES  . Umbilical hernia repair  03/22/2012    Procedure: HERNIA REPAIR UMBILICAL ADULT;  Surgeon: Leighton Ruff, MD;  Location: [facility];  Service: General;  Laterality: N/A;  Umbilical Hernia Repair   . Hernia repair  0000000    Umbilical Hernia Repair  . Irrigation and debridement abscess N/A 07/27/2013    Procedure: IRRIGATION AND DEBRIDEMENT OF SKIN, SOFT TISSUE AND MUSCLES OF UPPER BACK (11X19X4cm) WITH 10 BLADE AND PULSATILE LAVAGE ;  Surgeon: Gayland Curry, MD;  Location:  [facility];  Service: General;  Laterality: N/A;  . Cyst removed from back  07/2013   Social History:  reports that he has never smoked. He has never used smokeless tobacco. He reports that he drinks alcohol. He reports that he does not use illicit drugs.  Allergies  Allergen Reactions  . Shellfish Allergy Hives and Swelling    Family History  Problem Relation Age of Onset  . Colon cancer Neg Hx     Prior to Admission  medications   Medication Sig Start Date End Date Taking? Authorizing Provider  acetaminophen (TYLENOL) 500 MG tablet Only taking 1 hour before infusion.  AS premed for Fabrazyme   Yes Historical Provider, MD  Agalsidase beta (FABRAZYME IV) Inject into the vein every 14 (fourteen) days.    Yes Historical Provider, MD  ALBUTEROL SULFATE HFA IN Inhale 1 puff into the lungs daily as needed (shortness of breath or wheezing).    Yes Historical Provider, MD  allopurinol (ZYLOPRIM) 300 MG tablet Take 300 mg by mouth daily.   Yes Historical Provider, MD  aspirin 81 MG tablet Take 81 mg by mouth daily.    Yes Historical Provider, MD  colchicine 0.6 MG tablet Take 0.6 mg by mouth daily.   Yes Historical Provider, MD  eplerenone (INSPRA) 50 MG tablet Take 50 mg by mouth daily.   Yes Historical Provider, MD  fexofenadine (ALLEGRA) 180 MG tablet Take 180 mg by mouth every 14 (fourteen) days.    Yes Historical Provider, MD  glyBURIDE (DIABETA) 5 MG tablet Take 5 mg by mouth daily with breakfast.   Yes Historical Provider, MD  losartan-hydrochlorothiazide (HYZAAR) 50-12.5 MG per tablet Take 1 tablet by mouth daily.   Yes Historical Provider, MD   Physical Exam: Filed Vitals:   07/12/14 0945 07/12/14 1005 07/12/14 1130 07/12/14 1215  BP: 133/71 122/73 113/85 112/76  Pulse: 70 70 70 69  Temp:    97.4 F (36.3 C)  TempSrc:    Oral  Resp: 16 15 18 20   SpO2: 93% 96% 97% 93%    Wt Readings from Last 3 Encounters:  06/28/14 122.018 kg (269 lb)  05/27/14 123.288 kg (271 lb 12.8 oz)  05/10/14 122.131 kg (269 lb 4 oz)    General:  Appears calm and comfortable Eyes: PERRL, normal lids, irises & conjunctiva ENT: grossly normal hearing, lips & tongue Neck: no LAD, masses or thyromegaly Cardiovascular: RRR, no m/r/g. No LE edema. Telemetry: SR, no arrhythmias  Respiratory: CTA bilaterally, no w/r/r. Normal respiratory effort. Abdomen: soft, nt, nd Skin: no rash or induration seen on limited  exam Musculoskeletal: grossly normal tone BUE/BLE Psychiatric: grossly normal mood and affect, speech fluent and appropriate Neurologic: No facial asymmetry, moves extremities equally, answers questions appropriately           Labs on Admission:  Basic Metabolic Panel:  Recent Labs Lab 07/12/14 0810 07/12/14 1000  NA  --  125*  K  --  5.3*  CL  --  92*  CO2  --  19  GLUCOSE 774* 777*  BUN  --  37*  CREATININE  --  2.11*  CALCIUM  --  9.1   Liver Function Tests:  Recent Labs Lab 07/12/14 1000  AST 31  ALT 38  ALKPHOS 117  BILITOT 1.1  PROT 7.8  ALBUMIN 3.4*   No results for input(s): LIPASE, AMYLASE in the last 168 hours. No results for input(s): AMMONIA in the last 168 hours. CBC:  Recent Labs Lab  07/12/14 1000  WBC 8.9  HGB 17.2*  HCT 49.8  MCV 87.1  PLT 216   Cardiac Enzymes: No results for input(s): CKTOTAL, CKMB, CKMBINDEX, TROPONINI in the last 168 hours.  BNP (last 3 results) No results for input(s): BNP in the last 8760 hours.  ProBNP (last 3 results) No results for input(s): PROBNP in the last 8760 hours.  CBG:  Recent Labs Lab 07/12/14 0759 07/12/14 0930 07/12/14 1207 07/12/14 1249  GLUCAP >600* >600* >600* >600*    Radiological Exams on Admission: Dg Chest 2 View  07/12/2014   CLINICAL DATA:  Hyperglycemia, dizziness, nausea.  EXAM: CHEST  2 VIEW  COMPARISON:  07/30/2013  FINDINGS: Left pacer remains in place, unchanged. Heart is upper limits normal in size. Lungs are clear. No effusions or pneumothorax. No acute bony abnormality.  IMPRESSION: No active cardiopulmonary disease.   Electronically Signed   By: Rolm Baptise M.D.   On: 07/12/2014 10:24    Assessment/Plan Active Problems:   Type 2 diabetes mellitus with hyperosmolar nonketotic hyperglycemia - We'll place on insulin drip - BMP q 4 hours while on insulin gtt - Stepdown monitoring  Fabry disease - Patient to continue his infusions and treatments when they are due. Had  recent infusion.  - Patient to continue on discharge  Presumed acute on chronic renal failure - With history of poor oral intake most likely prerenal. He assess and provide IV fluid rehydration. Given his underlying genetic disorder if no improvement in serum creatinine with IV fluid rehydration this may represent a progressive problem involving his kidneys from Fabry's disease  COPD - Compensated currently will continue albuterol as needed for shortness of breath or wheezing  CHF - Patient has history of diastolic heart failure. Currently compensated. We'll continue home regimen. Given history of CHF we will provide gentle IV fluid hydration  Code Status: Full DVT Prophylaxis: Heparin Family Communication: No family at bedside Disposition Plan: Pending improvement in condition  Time spent: More than 45 minutes  Velvet Bathe [facility] Pager [phone]

## 2014-07-13 DIAGNOSIS — E1101 Type 2 diabetes mellitus with hyperosmolarity with coma: Secondary | ICD-10-CM

## 2014-07-13 LAB — BASIC METABOLIC PANEL
Anion gap: 12 (ref 5–15)
BUN: 36 mg/dL — ABNORMAL HIGH (ref 6–23)
CO2: 24 mmol/L (ref 19–32)
Calcium: 9.1 mg/dL (ref 8.4–10.5)
Chloride: 102 mmol/L (ref 96–112)
Creatinine, Ser: 1.9 mg/dL — ABNORMAL HIGH (ref 0.50–1.35)
GFR calc Af Amer: 45 mL/min — ABNORMAL LOW (ref >90)
GFR calc non Af Amer: 39 mL/min — ABNORMAL LOW (ref >90)
Glucose, Bld: 280 mg/dL — ABNORMAL HIGH (ref 70–99)
Potassium: 3.9 mmol/L (ref 3.5–5.1)
Sodium: 138 mmol/L (ref 135–145)

## 2014-07-13 LAB — GLUCOSE, CAPILLARY
Glucose-Capillary: 142 mg/dL — ABNORMAL HIGH (ref 70–99)
Glucose-Capillary: 239 mg/dL — ABNORMAL HIGH (ref 70–99)
Glucose-Capillary: 300 mg/dL — ABNORMAL HIGH (ref 70–99)
Glucose-Capillary: 264 mg/dL — ABNORMAL HIGH (ref 70–99)
Glucose-Capillary: 230 mg/dL — ABNORMAL HIGH (ref 70–99)

## 2014-07-13 MED ORDER — INSULIN ASPART 100 UNIT/ML ~~LOC~~ SOLN
5.0000 [IU] | Freq: Once | SUBCUTANEOUS | Status: AC
  Administered 2014-07-13: 5 [IU] via SUBCUTANEOUS

## 2014-07-13 NOTE — Progress Notes (Signed)
[facility] PROGRESS NOTE  ZAEDIN BASHA [id number] DOB: 29-Nov-1960 DOA: 07/12/2014 PCP: Elizabeth Palau, MD  Assessment/Plan: Active Problems:  Type 2 diabetes mellitus with hyperosmolar nonketotic hyperglycemia - was successfully transitioned off of insulin gtt - routine cbg monitoring - transfer to med surg given improvement in condition.  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 Had recent infusion prior to admission.  - Patient to continue routine therapy on discharge  Presumed acute on chronic renal failure - With history of poor oral intake most likely prerenal. Improving with IV fluid rehydration. Given his underlying genetic disorder if no improvement in serum creatinine with IV fluid rehydration this may represent a progressive problem involving his kidneys from Fabry's disease  COPD - Compensated currently will continue albuterol as needed for shortness of breath or wheezing  CHF - Patient has history of diastolic heart failure. Currently compensated. We'll continue home regimen. - saline lock  Code Status: full Family Communication: no family at bedside Disposition Plan: transfer to med surg for further evaluation and recommendations.   Consultants:  none  Procedures:  Insulin gtt  Antibiotics:  none   HPI/Subjective: Pt has no new complaints.  Objective: Filed Vitals:   07/13/14 0400  BP: 138/75  Pulse: 71  Temp: 98.2 F (36.8 C)  Resp: 14    Intake/Output Summary (Last 24 hours) at 07/13/14 0909 Last data filed at 07/13/14 0600  Gross per 24 hour  Intake 1755.91 ml  Output   1100 ml  Net 655.91 ml   Filed Weights   07/12/14 1322 07/13/14 0600  Weight: 109.6 kg (241 lb 10 oz) 111 kg (244 lb 11.4 oz)    Exam:   General:  Pt in nad, alert and awake  Cardiovascular: rrr, no mrg  Respiratory: cta bl, no wheezes  Abdomen: soft, NT, ND  Musculoskeletal: no cyanosis or  clubbing   Data Reviewed: Basic Metabolic Panel:  Recent Labs Lab 07/12/14 0810 07/12/14 1000 07/12/14 1455 07/13/14 0335  NA  --  125* 134* 138  K  --  5.3* 4.0 3.9  CL  --  92* 98 102  CO2  --  19 23 24   GLUCOSE 774* 777* 503* 280*  BUN  --  37* 38* 36*  CREATININE  --  2.11* 2.08* 1.90*  CALCIUM  --  9.1 9.3 9.1   Liver Function Tests:  Recent Labs Lab 07/12/14 1000  AST 31  ALT 38  ALKPHOS 117  BILITOT 1.1  PROT 7.8  ALBUMIN 3.4*   No results for input(s): LIPASE, AMYLASE in the last 168 hours. No results for input(s): AMMONIA in the last 168 hours. CBC:  Recent Labs Lab 07/12/14 1000  WBC 8.9  HGB 17.2*  HCT 49.8  MCV 87.1  PLT 216   Cardiac Enzymes: No results for input(s): CKTOTAL, CKMB, CKMBINDEX, TROPONINI in the last 168 hours. BNP (last 3 results) No results for input(s): BNP in the last 8760 hours.  ProBNP (last 3 results) No results for input(s): PROBNP in the last 8760 hours.  CBG:  Recent Labs Lab 07/12/14 1804 07/12/14 1855 07/12/14 2007 07/12/14 2132 07/12/14 2245  GLUCAP 146* 139* 133* 142* 198*    Recent Results (from the past 240 hour(s))  MRSA PCR Screening     Status: None   Collection Time: 07/12/14  1:18 PM  Result Value Ref Range Status   MRSA by PCR NEGATIVE NEGATIVE Final    Comment:  The GeneXpert MRSA Assay (FDA approved for NASAL specimens only), is one component of a comprehensive MRSA colonization surveillance program. It is not intended to diagnose MRSA infection nor to guide or monitor treatment for MRSA infections.      Studies: Dg Chest 2 View  07/12/2014   CLINICAL DATA:  Hyperglycemia, dizziness, nausea.  EXAM: CHEST  2 VIEW  COMPARISON:  07/30/2013  FINDINGS: Left pacer remains in place, unchanged. Heart is upper limits normal in size. Lungs are clear. No effusions or pneumothorax. No acute bony abnormality.  IMPRESSION: No active cardiopulmonary disease.   Electronically Signed   By:  Rolm Baptise M.D.   On: 07/12/2014 10:24    Scheduled Meds: . allopurinol  300 mg Oral Daily  . aspirin  81 mg Oral Daily  . colchicine  0.6 mg Oral Daily  . heparin  5,000 Units Subcutaneous 3 times per day  . insulin aspart  0-20 Units Subcutaneous TID WC  . insulin aspart  0-5 Units Subcutaneous QHS  . insulin glargine  20 Units Subcutaneous QHS  . loratadine  10 mg Oral Daily  . sodium chloride  3 mL Intravenous Q12H  . spironolactone  25 mg Oral Daily   Continuous Infusions: . sodium chloride 75 mL/hr at 07/12/14 2254     Time spent: > 35 minutes    Velvet Bathe  [facility] Pager [phone]. If 7PM-7AM, please contact night-coverage at [email or web address], password [facility] 07/13/2014, 9:09 AM  LOS: 1 day

## 2014-07-14 LAB — GLUCOSE, CAPILLARY
Glucose-Capillary: 223 mg/dL — ABNORMAL HIGH (ref 70–99)
Glucose-Capillary: 328 mg/dL — ABNORMAL HIGH (ref 70–99)

## 2014-07-14 MED ORDER — INSULIN GLARGINE 100 UNIT/ML ~~LOC~~ SOLN: 22.0000 [IU] | Freq: Every day | SUBCUTANEOUS | Status: DC

## 2014-07-14 MED ORDER — INSULIN DETEMIR 100 UNIT/ML ~~LOC~~ SOLN: 22.0000 [IU] | Freq: Every day | SUBCUTANEOUS | Status: DC

## 2014-07-14 NOTE — Discharge Summary (Signed)
Physician Discharge Summary  [name] [id number] DOB: 26-Mar-1961 DOA: 07/12/2014  PCP: Elizabeth Palau, MD  Admit date: 07/12/2014 Discharge date: 07/14/2014  Time spent: >35 minutes  Recommendations for Outpatient Follow-up:  1. Please follow-up with blood sugars and adjust hypoglycemic agents as needed  Discharge Diagnoses:  Active Problems:   Type 2 diabetes mellitus with hyperosmolar nonketotic hyperglycemia   Hyperglycemia   Discharge Condition: stable  Diet recommendation: Carb modified  Filed Weights   07/12/14 1322 07/13/14 0600 07/14/14 0522  Weight: 109.6 kg (241 lb 10 oz) 111 kg (244 lb 11.4 oz) 113.626 kg (250 lb 8 oz)    History of present illness:   54 year old with history of Fabry's disease, hypertension, Mobitz type II atrioventricular block, chronic renal failure, type II DM who presented to the hospital with hyperosmolar nonketotic hyperglycemia.   Hospital Course  diabetes mellitus:  - Not well controlled on oral regimen, hemoglobin A1c pending. I suspect the patient's diabetes will be better controlled with insulin regimen. Discussed this with patient who is in agreement. We'll plan on discharging on Levemir 22 units subcutaneous daily. I have recommended patient monitor blood sugars regularly so he can review these with his primary care physician.  - Continue diabetic diet   Fabry's disease - Patient to continue routine therapy  Chronic kidney disease - Serum creatinine trending down, will hold ARB on discharge  Hypertension - Relatively well controlled off of HCTZ and ARB. Given elevation in serum creatinine we'll hold combination medication on discharge.   Consultations:  None   Discharge Exam: Filed Vitals:   07/14/14 0522  BP: 128/95  Pulse: 70  Temp: 98 F (36.7 C)  Resp: 20    General: Patient in no acute distress, alert and awake CV: s1 and s2 wnl, no rubs Respiratory: CTA BL, no wheezes  Discharge  Instructions   Discharge Instructions    Call MD for:  extreme fatigue    Complete by:  As directed      Call MD for:  temperature >100.4    Complete by:  As directed      Diet - low sodium heart healthy    Complete by:  As directed      Discharge instructions    Complete by:  As directed   We will hold your blood pressure medication given your recent elevation of Serum creatinine. Please have your primary care physician reassess your blood sugars. Check your blood sugars atleast 2 times per day in the morning fasting and post prandial.     Increase activity slowly    Complete by:  As directed           Current Discharge Medication List    START taking these medications   Details  insulin detemir (LEVEMIR) 100 UNIT/ML injection Inject 0.22 mLs (22 Units total) into the skin at bedtime. Qty: 10 mL, Refills: 0      CONTINUE these medications which have NOT CHANGED   Details  acetaminophen (TYLENOL) 500 MG tablet Only taking 1 hour before infusion.  AS premed for Fabrazyme    Agalsidase beta (FABRAZYME IV) Inject into the vein every 14 (fourteen) days.     ALBUTEROL SULFATE HFA IN Inhale 1 puff into the lungs daily as needed (shortness of breath or wheezing).     allopurinol (ZYLOPRIM) 300 MG tablet Take 300 mg by mouth daily.    aspirin 81 MG tablet Take 81 mg by mouth daily.     colchicine 0.6 MG  tablet Take 0.6 mg by mouth daily.    eplerenone (INSPRA) 50 MG tablet Take 50 mg by mouth daily.    fexofenadine (ALLEGRA) 180 MG tablet Take 180 mg by mouth every 14 (fourteen) days.       STOP taking these medications     glyBURIDE (DIABETA) 5 MG tablet      losartan-hydrochlorothiazide (HYZAAR) 50-12.5 MG per tablet        Allergies  Allergen Reactions  . Shellfish Allergy Hives and Swelling      The results of significant diagnostics from this hospitalization (including imaging, microbiology, ancillary and laboratory) are listed below for reference.     Significant Diagnostic Studies: Dg Chest 2 View  07/12/2014   CLINICAL DATA:  Hyperglycemia, dizziness, nausea.  EXAM: CHEST  2 VIEW  COMPARISON:  07/30/2013  FINDINGS: Left pacer remains in place, unchanged. Heart is upper limits normal in size. Lungs are clear. No effusions or pneumothorax. No acute bony abnormality.  IMPRESSION: No active cardiopulmonary disease.   Electronically Signed   By: Rolm Baptise M.D.   On: 07/12/2014 10:24    Microbiology: Recent Results (from the past 240 hour(s))  MRSA PCR Screening     Status: None   Collection Time: 07/12/14  1:18 PM  Result Value Ref Range Status   MRSA by PCR NEGATIVE NEGATIVE Final    Comment:        The GeneXpert MRSA Assay (FDA approved for NASAL specimens only), is one component of a comprehensive MRSA colonization surveillance program. It is not intended to diagnose MRSA infection nor to guide or monitor treatment for MRSA infections.      Labs: Basic Metabolic Panel:  Recent Labs Lab 07/12/14 0810 07/12/14 1000 07/12/14 1455 07/13/14 0335  NA  --  125* 134* 138  K  --  5.3* 4.0 3.9  CL  --  92* 98 102  CO2  --  19 23 24   GLUCOSE 774* 777* 503* 280*  BUN  --  37* 38* 36*  CREATININE  --  2.11* 2.08* 1.90*  CALCIUM  --  9.1 9.3 9.1   Liver Function Tests:  Recent Labs Lab 07/12/14 1000  AST 31  ALT 38  ALKPHOS 117  BILITOT 1.1  PROT 7.8  ALBUMIN 3.4*   No results for input(s): LIPASE, AMYLASE in the last 168 hours. No results for input(s): AMMONIA in the last 168 hours. CBC:  Recent Labs Lab 07/12/14 1000  WBC 8.9  HGB 17.2*  HCT 49.8  MCV 87.1  PLT 216   Cardiac Enzymes: No results for input(s): CKTOTAL, CKMB, CKMBINDEX, TROPONINI in the last 168 hours. BNP: BNP (last 3 results) No results for input(s): BNP in the last 8760 hours.  ProBNP (last 3 results) No results for input(s): PROBNP in the last 8760 hours.  CBG:  Recent Labs Lab 07/13/14 0812 07/13/14 1233 07/13/14 1637  07/13/14 2104 07/14/14 0749  GLUCAP 239* 300* 264* 230* 223*       Signed:  Velvet Bathe  [facility] 07/14/2014, 11:04 AM

## 2014-07-14 NOTE — Progress Notes (Signed)
Pt leaving at this time. Pt drove to hospital for "infusion" and will be driving himself home. Pt alert, oriented, and without c/o. Discharge instructions/prescription given/explained with pt verbalizing understanding. Followup appointments noted.

## 2014-07-15 LAB — HEMOGLOBIN A1C
Hgb A1c MFr Bld: 12.3 % — ABNORMAL HIGH (ref 4.8–5.6)
Mean Plasma Glucose: 306 mg/dL

## 2014-07-17 DIAGNOSIS — E118 Type 2 diabetes mellitus with unspecified complications: Secondary | ICD-10-CM | POA: Diagnosis not present

## 2014-07-17 DIAGNOSIS — E7521 Fabry (-Anderson) disease: Secondary | ICD-10-CM | POA: Diagnosis not present

## 2014-07-17 DIAGNOSIS — I129 Hypertensive chronic kidney disease with stage 1 through stage 4 chronic kidney disease, or unspecified chronic kidney disease: Secondary | ICD-10-CM | POA: Diagnosis not present

## 2014-07-17 DIAGNOSIS — N182 Chronic kidney disease, stage 2 (mild): Secondary | ICD-10-CM | POA: Diagnosis not present

## 2014-07-23 ENCOUNTER — Observation Stay (HOSPITAL_COMMUNITY)
Admission: EM | Admit: 2014-07-23 | Discharge: 2014-07-24 | Disposition: A | Discharge status: Home / Self Care | Payer: Medicare Other | Attending: Internal Medicine | Admitting: Internal Medicine

## 2014-07-23 ENCOUNTER — Encounter (HOSPITAL_COMMUNITY): Payer: Self-pay | Admitting: Emergency Medicine

## 2014-07-23 DIAGNOSIS — R739 Hyperglycemia, unspecified: Secondary | ICD-10-CM

## 2014-07-23 DIAGNOSIS — N183 Chronic kidney disease, stage 3 unspecified: Secondary | ICD-10-CM | POA: Insufficient documentation

## 2014-07-23 DIAGNOSIS — E1165 Type 2 diabetes mellitus with hyperglycemia: Secondary | ICD-10-CM | POA: Diagnosis not present

## 2014-07-23 DIAGNOSIS — K5900 Constipation, unspecified: Secondary | ICD-10-CM | POA: Diagnosis not present

## 2014-07-23 DIAGNOSIS — I129 Hypertensive chronic kidney disease with stage 1 through stage 4 chronic kidney disease, or unspecified chronic kidney disease: Secondary | ICD-10-CM | POA: Insufficient documentation

## 2014-07-23 DIAGNOSIS — Z950 Presence of cardiac pacemaker: Secondary | ICD-10-CM | POA: Diagnosis not present

## 2014-07-23 DIAGNOSIS — I890 Lymphedema, not elsewhere classified: Secondary | ICD-10-CM | POA: Insufficient documentation

## 2014-07-23 DIAGNOSIS — Z79899 Other long term (current) drug therapy: Secondary | ICD-10-CM | POA: Diagnosis not present

## 2014-07-23 DIAGNOSIS — E7521 Fabry (-Anderson) disease: Secondary | ICD-10-CM | POA: Insufficient documentation

## 2014-07-23 DIAGNOSIS — M109 Gout, unspecified: Secondary | ICD-10-CM | POA: Diagnosis not present

## 2014-07-23 DIAGNOSIS — IMO0001 Reserved for inherently not codable concepts without codable children: Secondary | ICD-10-CM | POA: Insufficient documentation

## 2014-07-23 DIAGNOSIS — E875 Hyperkalemia: Secondary | ICD-10-CM | POA: Diagnosis not present

## 2014-07-23 DIAGNOSIS — I5032 Chronic diastolic (congestive) heart failure: Secondary | ICD-10-CM | POA: Diagnosis not present

## 2014-07-23 HISTORY — DX: Pneumonia, unspecified organism: J18.9

## 2014-07-23 HISTORY — DX: Type 2 diabetes mellitus without complications: E11.9

## 2014-07-23 HISTORY — DX: Unspecified chronic bronchitis: J42

## 2014-07-23 HISTORY — DX: Chronic kidney disease, stage 2 (mild): N18.2

## 2014-07-23 LAB — URINALYSIS, ROUTINE W REFLEX MICROSCOPIC
Bilirubin Urine: NEGATIVE
Glucose, UA: >1000 mg/dL — AB
Hgb urine dipstick: NEGATIVE
Ketones, ur: NEGATIVE mg/dL
Leukocytes, UA: NEGATIVE
Nitrite: NEGATIVE
Protein, ur: 30 mg/dL — AB
Specific Gravity, Urine: 1.023 (ref 1.005–1.030)
Urobilinogen, UA: 0.2 mg/dL (ref 0.0–1.0)
pH: 6 (ref 5.0–8.0)

## 2014-07-23 LAB — COMPREHENSIVE METABOLIC PANEL
ALT: 29 U/L (ref 0–53)
ALT: 28 U/L (ref 0–53)
AST: 29 U/L (ref 0–37)
AST: 26 U/L (ref 0–37)
Albumin: 3.1 g/dL — ABNORMAL LOW (ref 3.5–5.2)
Albumin: 2.8 g/dL — ABNORMAL LOW (ref 3.5–5.2)
Alkaline Phosphatase: 131 U/L — ABNORMAL HIGH (ref 39–117)
Alkaline Phosphatase: 123 U/L — ABNORMAL HIGH (ref 39–117)
Anion gap: 9 (ref 5–15)
Anion gap: 11 (ref 5–15)
BUN: 17 mg/dL (ref 6–23)
BUN: 13 mg/dL (ref 6–23)
CO2: 26 mmol/L (ref 19–32)
CO2: 22 mmol/L (ref 19–32)
Calcium: 9.5 mg/dL (ref 8.4–10.5)
Calcium: 8.7 mg/dL (ref 8.4–10.5)
Chloride: 97 mmol/L (ref 96–112)
Chloride: 103 mmol/L (ref 96–112)
Creatinine, Ser: 1.78 mg/dL — ABNORMAL HIGH (ref 0.50–1.35)
Creatinine, Ser: 1.55 mg/dL — ABNORMAL HIGH (ref 0.50–1.35)
GFR calc Af Amer: 49 mL/min — ABNORMAL LOW (ref >90)
GFR calc Af Amer: 57 mL/min — ABNORMAL LOW (ref >90)
GFR calc non Af Amer: 42 mL/min — ABNORMAL LOW (ref >90)
GFR calc non Af Amer: 49 mL/min — ABNORMAL LOW (ref >90)
Glucose, Bld: 735 mg/dL (ref 70–99)
Glucose, Bld: 548 mg/dL — ABNORMAL HIGH (ref 70–99)
Potassium: 5.3 mmol/L — ABNORMAL HIGH (ref 3.5–5.1)
Potassium: 4.4 mmol/L (ref 3.5–5.1)
Sodium: 132 mmol/L — ABNORMAL LOW (ref 135–145)
Sodium: 136 mmol/L (ref 135–145)
Total Bilirubin: 0.6 mg/dL (ref 0.3–1.2)
Total Bilirubin: 0.6 mg/dL (ref 0.3–1.2)
Total Protein: 8 g/dL (ref 6.0–8.3)
Total Protein: 7.2 g/dL (ref 6.0–8.3)

## 2014-07-23 LAB — I-STAT ARTERIAL BLOOD GAS, ED
Acid-base deficit: 4 mmol/L — ABNORMAL HIGH (ref 0.0–2.0)
Bicarbonate: 20.5 mEq/L (ref 20.0–24.0)
O2 Saturation: 96 %
Patient temperature: 98.6
TCO2: 22 mmol/L (ref 0–100)
pCO2 arterial: 34.9 mmHg — ABNORMAL LOW (ref 35.0–45.0)
pH, Arterial: 7.377 (ref 7.350–7.450)
pO2, Arterial: 87 mmHg (ref 80.0–100.0)

## 2014-07-23 LAB — CBC WITH DIFFERENTIAL/PLATELET
Basophils Absolute: 0 10*3/uL (ref 0.0–0.1)
Basophils Relative: 0 % (ref 0–1)
Eosinophils Absolute: 0.1 10*3/uL (ref 0.0–0.7)
Eosinophils Relative: 1 % (ref 0–5)
HCT: 46.8 % (ref 39.0–52.0)
Hemoglobin: 16.2 g/dL (ref 13.0–17.0)
Lymphocytes Relative: 23 % (ref 12–46)
Lymphs Abs: 2.6 10*3/uL (ref 0.7–4.0)
MCH: 30.2 pg (ref 26.0–34.0)
MCHC: 34.6 g/dL (ref 30.0–36.0)
MCV: 87.3 fL (ref 78.0–100.0)
Monocytes Absolute: 1.3 10*3/uL — ABNORMAL HIGH (ref 0.1–1.0)
Monocytes Relative: 11 % (ref 3–12)
Neutro Abs: 7.4 10*3/uL (ref 1.7–7.7)
Neutrophils Relative %: 65 % (ref 43–77)
Platelets: 216 10*3/uL (ref 150–400)
RBC: 5.36 MIL/uL (ref 4.22–5.81)
RDW: 13 % (ref 11.5–15.5)
WBC: 11.4 10*3/uL — ABNORMAL HIGH (ref 4.0–10.5)

## 2014-07-23 LAB — CBG MONITORING, ED
Glucose-Capillary: >600 mg/dL (ref 70–99)
Glucose-Capillary: >600 mg/dL (ref 70–99)
Glucose-Capillary: 415 mg/dL — ABNORMAL HIGH (ref 70–99)

## 2014-07-23 LAB — URINE MICROSCOPIC-ADD ON

## 2014-07-23 LAB — GLUCOSE, CAPILLARY: Glucose-Capillary: 398 mg/dL — ABNORMAL HIGH (ref 70–99)

## 2014-07-23 MED ORDER — SODIUM CHLORIDE 0.9 % IV SOLN
INTRAVENOUS | Status: DC
  Administered 2014-07-23 – 2014-07-24 (×2): via INTRAVENOUS

## 2014-07-23 MED ORDER — ACETAMINOPHEN 325 MG PO TABS: 650.0000 mg | ORAL_TABLET | Freq: Four times a day (QID) | ORAL | Status: DC | PRN

## 2014-07-23 MED ORDER — ONDANSETRON HCL 4 MG/2ML IJ SOLN: 4.0000 mg | Freq: Four times a day (QID) | INTRAMUSCULAR | Status: DC | PRN

## 2014-07-23 MED ORDER — INSULIN ASPART 100 UNIT/ML ~~LOC~~ SOLN: 15.0000 [IU] | Freq: Once | SUBCUTANEOUS | Status: DC

## 2014-07-23 MED ORDER — GLYBURIDE 5 MG PO TABS: 5.0000 mg | ORAL_TABLET | Freq: Every day | ORAL | Status: DC

## 2014-07-23 MED ORDER — INSULIN NPH ISOPHANE & REGULAR (70-30) 100 UNIT/ML ~~LOC~~ SUSP: 20.0000 [IU] | Freq: Two times a day (BID) | SUBCUTANEOUS | Status: DC

## 2014-07-23 MED ORDER — HEPARIN SODIUM (PORCINE) 5000 UNIT/ML IJ SOLN
5000.0000 [IU] | Freq: Three times a day (TID) | INTRAMUSCULAR | Status: DC
  Administered 2014-07-23 – 2014-07-24 (×2): 5000 [IU] via SUBCUTANEOUS
  Filled 2014-07-23 (×2): qty 1

## 2014-07-23 MED ORDER — INSULIN ASPART 100 UNIT/ML ~~LOC~~ SOLN
0.0000 [IU] | Freq: Every day | SUBCUTANEOUS | Status: DC
  Administered 2014-07-23: 5 [IU] via SUBCUTANEOUS

## 2014-07-23 MED ORDER — INSULIN ASPART 100 UNIT/ML ~~LOC~~ SOLN
0.0000 [IU] | Freq: Three times a day (TID) | SUBCUTANEOUS | Status: DC
  Administered 2014-07-23: 15 [IU] via SUBCUTANEOUS
  Administered 2014-07-24: 3 [IU] via SUBCUTANEOUS

## 2014-07-23 MED ORDER — INSULIN DETEMIR 100 UNIT/ML ~~LOC~~ SOLN
40.0000 [IU] | Freq: Every day | SUBCUTANEOUS | Status: DC
  Administered 2014-07-23: 40 [IU] via SUBCUTANEOUS
  Filled 2014-07-23 (×2): qty 0.4

## 2014-07-23 MED ORDER — ACETAMINOPHEN 650 MG RE SUPP: 650.0000 mg | Freq: Four times a day (QID) | RECTAL | Status: DC | PRN

## 2014-07-23 MED ORDER — ONDANSETRON HCL 4 MG PO TABS: 4.0000 mg | ORAL_TABLET | Freq: Four times a day (QID) | ORAL | Status: DC | PRN

## 2014-07-23 MED ORDER — INSULIN ASPART 100 UNIT/ML ~~LOC~~ SOLN
4.0000 [IU] | Freq: Three times a day (TID) | SUBCUTANEOUS | Status: DC
  Administered 2014-07-23 – 2014-07-24 (×2): 4 [IU] via SUBCUTANEOUS

## 2014-07-23 MED ORDER — SODIUM CHLORIDE 0.9 % IV BOLUS (SEPSIS)
1000.0000 mL | Freq: Once | INTRAVENOUS | Status: AC
  Administered 2014-07-23: 1000 mL via INTRAVENOUS

## 2014-07-23 MED ORDER — SENNA 8.6 MG PO TABS: 2.0000 | ORAL_TABLET | Freq: Every evening | ORAL | Status: DC | PRN

## 2014-07-23 MED ORDER — INSULIN ASPART 100 UNIT/ML ~~LOC~~ SOLN
15.0000 [IU] | Freq: Once | SUBCUTANEOUS | Status: AC
  Administered 2014-07-23: 15 [IU] via SUBCUTANEOUS
  Filled 2014-07-23: qty 1

## 2014-07-23 NOTE — Progress Notes (Signed)
Patient initially planned to be discharged home from the ED with insulin prescriptions however after receiving 15 units of aspart and 2 L IV normal saline his blood glucose was still elevated at 570s. He will be admitted overnight for IV hydration and adequate blood glucose control and adequate outpatient regimen prior to discharge.  Patient will be placed on Levemir 40 units daily at bedtime with mealtime coverage with 5 units aspart 3 times a day. Check renal function and potassium in the morning.

## 2014-07-23 NOTE — ED Notes (Signed)
CBG is 415. Notified nurse Carmelina Paddock.

## 2014-07-23 NOTE — ED Notes (Signed)
CBG Exceeds Measure. Notified nurse Carmelina Paddock.

## 2014-07-23 NOTE — ED Notes (Signed)
Admitting MD notified of Blood sugar

## 2014-07-23 NOTE — ED Notes (Signed)
Pt sts hyperglycemia when checked at home; pt sts taking meds and prescribed

## 2014-07-23 NOTE — ED Provider Notes (Signed)
CSN: [id number]     Arrival date & time 07/23/14  1106 History   First MD Initiated Contact with Patient 07/23/14 1150     Chief Complaint  Patient presents with  . Hyperglycemia     (Consider location/radiation/quality/duration/timing/severity/associated sxs/prior Treatment) HPI [name] is a 54 y.o. male with a history of hypertension, CHF, Fabry's disease,  diabetes, recent discharge on 2/7 for hyperglycemia comes in from his PCPs office for evaluation of acute hyperglycemia. Patient states when he was discharged he was started on Levemir, 22 units before bed each night. He reports being compliant with this medication regimen but when he checked his blood sugar this morning it read "high". He reports feeling constipated and lightheaded at this time but denies any abdominal pain, nausea or vomiting. Denies fevers, chest pain, shortness of breath, cough. He does report bilateral leg swelling which is baseline for him. There are no other modifying factors for his current problem. He reports only taking Levemir for his diabetes at this time. He denies taking glyburide or metformin.  Past Medical History  Diagnosis Date  . Second degree Mobitz II AV block     with syncope, s/p PPM  . Hypertension   . Bifascicular block   . Diabetes mellitus   . Pacemaker     02/12/11  . Lymphedema of lower extremity LEFT  >  RIGHT    USES PUMP AT HOME-- DOES NOT WEAR HOSE  . Short of breath on exertion   . History of cellulitis of skin with lymphangitis LEFT LEG  . Diastolic heart failure secondary to hypertrophic cardiomyopathy CARDIOLOGIST-- DR Daneen Schick  . Gout, arthritis 11/2011    bil feet. right worse  . Seasonal asthma NO INHALERS  . Fall 07/2013  . Edema 07/2013  . CHF (congestive heart failure) 2010  . Fabry's disease RENAL AND CARDIAC INVOVLEMENT    FOLLOWED DR [place]  . Chronic renal insufficiency    Past Surgical History  Procedure Laterality Date  . Pacemaker insertion   02/12/11    SJM implanted by Dr Rayann Heman for Mobitz II second degree AV block and syncope  . Cardiac catheterization  03-12-2011  DR Daneen Schick    HYPERTROPHIC CARDIOMYOPATHY WITH LV CAVITY  APPEARANCE CONSISTANT WITH SIGNIFICANT APICAL HYPERTROPHY/ NORMAL LVSF / EF 55%/ EVIDENCE OF DIASTOLIC DYSFUNCTION WITH EDP OF 23-32mmHg AFTER A-WAVE/ NORMAL CORONARY ARTERIES  . Umbilical hernia repair  03/22/2012    Procedure: HERNIA REPAIR UMBILICAL ADULT;  Surgeon: Leighton Ruff, MD;  Location: [facility];  Service: General;  Laterality: N/A;  Umbilical Hernia Repair   . Hernia repair  0000000    Umbilical Hernia Repair  . Irrigation and debridement abscess N/A 07/27/2013    Procedure: IRRIGATION AND DEBRIDEMENT OF SKIN, SOFT TISSUE AND MUSCLES OF UPPER BACK ([id number]) WITH 10 BLADE AND PULSATILE LAVAGE ;  Surgeon: Gayland Curry, MD;  Location: [facility];  Service: General;  Laterality: N/A;  . Cyst removed from back  07/2013   Family History  Problem Relation Age of Onset  . Colon cancer Neg Hx    History  Substance Use Topics  . Smoking status: Never Smoker   . Smokeless tobacco: Never Used  . Alcohol Use: Yes     Comment: OCCASIONAL    Review of Systems A 10 point review of systems was completed and was negative except for pertinent positives and negatives as mentioned in the history of present illness     Allergies  Shellfish allergy  Home Medications   Prior to Admission medications   Medication Sig Start Date End Date Taking? Authorizing Provider  acetaminophen (TYLENOL) 500 MG tablet Only taking 1 hour before infusion.  AS premed for Fabrazyme    Historical Provider, MD  Agalsidase beta (FABRAZYME IV) Inject into the vein every 14 (fourteen) days.     Historical Provider, MD  ALBUTEROL SULFATE HFA IN Inhale 1 puff into the lungs daily as needed (shortness of breath or wheezing).     Historical Provider, MD  allopurinol (ZYLOPRIM) 300 MG tablet Take 300 mg by mouth daily.    Historical  Provider, MD  aspirin 81 MG tablet Take 81 mg by mouth daily.     Historical Provider, MD  colchicine 0.6 MG tablet Take 0.6 mg by mouth daily.    Historical Provider, MD  eplerenone (INSPRA) 50 MG tablet Take 50 mg by mouth daily.    Historical Provider, MD  fexofenadine (ALLEGRA) 180 MG tablet Take 180 mg by mouth every 14 (fourteen) days.     Historical Provider, MD  glyBURIDE (DIABETA) 5 MG tablet Take 1 tablet (5 mg total) by mouth daily with breakfast. 07/23/14   [name], MD  insulin detemir (LEVEMIR) 100 UNIT/ML injection Inject 0.22 mLs (22 Units total) into the skin at bedtime. 07/14/14   Velvet Bathe, MD  insulin NPH-regular Human (NOVOLIN 70/30) (70-30) 100 UNIT/ML injection Inject 20 Units into the skin 2 (two) times daily with a meal. 07/23/14   [name], MD  senna (SENOKOT) 8.6 MG TABS tablet Take 2 tablets (17.2 mg total) by mouth at bedtime as needed for mild constipation. 07/23/14   [name], MD   BP 135/83 mmHg  Pulse 71  Temp(Src) 98.5 F (36.9 C) (Oral)  Resp 20  SpO2 97% Physical Exam  Constitutional: He is oriented to person, place, and time. He appears well-developed and well-nourished.  HENT:  Head: Normocephalic and atraumatic.  Dry, cracked lips. Dry mucous membranes.  Eyes: Conjunctivae are normal. Pupils are equal, round, and reactive to light. Right eye exhibits no discharge. Left eye exhibits no discharge. No scleral icterus.  Neck: Neck supple.  Cardiovascular: Normal rate, regular rhythm and normal heart sounds.   Pulmonary/Chest: Effort normal and breath sounds normal. No respiratory distress. He has no wheezes. He has no rales.  Abdominal: Soft. There is no tenderness.  Musculoskeletal: He exhibits no tenderness.  Compression hose in place up to the tibia. Mild tibial edema.  Neurological: He is alert and oriented to person, place, and time.  Cranial Nerves II-XII grossly intact  Skin: Skin is warm and dry. No rash noted.  Psychiatric:  He has a normal mood and affect.  Nursing note and vitals reviewed.   ED Course  Procedures (including critical care time) Labs Review Labs Reviewed  COMPREHENSIVE METABOLIC PANEL - Abnormal; Notable for the following:    Sodium 132 (*)    Potassium 5.3 (*)    Glucose, Bld 735 (*)    Creatinine, Ser 1.78 (*)    Albumin 3.1 (*)    Alkaline Phosphatase 131 (*)    GFR calc non Af Amer 42 (*)    GFR calc Af Amer 49 (*)    All other components within normal limits  URINALYSIS, ROUTINE W REFLEX MICROSCOPIC - Abnormal; Notable for the following:    Glucose, UA >1000 (*)    Protein, ur 30 (*)    All other components within normal limits  CBC WITH DIFFERENTIAL/PLATELET - Abnormal; Notable  for the following:    WBC 11.4 (*)    Monocytes Absolute 1.3 (*)    All other components within normal limits  CBG MONITORING, ED - Abnormal; Notable for the following:    Glucose-Capillary >600 (*)    All other components within normal limits  I-STAT ARTERIAL BLOOD GAS, ED - Abnormal; Notable for the following:    pCO2 arterial 34.9 (*)    Acid-base deficit 4.0 (*)    All other components within normal limits  URINE MICROSCOPIC-ADD ON  BLOOD GAS, ARTERIAL  CBG MONITORING, ED    Imaging Review No results found.   EKG Interpretation None     Meds given in ED:  Medications  insulin aspart (novoLOG) injection 15 Units (15 Units Subcutaneous Given 07/23/14 1329)    Current Discharge Medication List    START taking these medications   Details  glyBURIDE (DIABETA) 5 MG tablet Take 1 tablet (5 mg total) by mouth daily with breakfast. Qty: 30 tablet, Refills: 3    insulin NPH-regular Human (NOVOLIN 70/30) (70-30) 100 UNIT/ML injection Inject 20 Units into the skin 2 (two) times daily with a meal. Qty: 10 mL, Refills: 11    senna (SENOKOT) 8.6 MG TABS tablet Take 2 tablets (17.2 mg total) by mouth at bedtime as needed for mild constipation. Qty: 15 each, Refills: 0       Filed Vitals:    07/23/14 1300 07/23/14 1315 07/23/14 1330 07/23/14 1350  BP: 126/77 135/74 143/77 135/83  Pulse: 72 69 71   Temp:      TempSrc:      Resp:      SpO2: 96% 100% 97%     MDM  Patient presents with hyperglycemia that is uncontrolled with current diabetes regimen of Levemir 20 units at night. Patient is alert and oriented at this time, no acidosis, no evidence of DKA. Patient was recently seen in ED and discharged on 2/7 for hyperglycemia. Patient started on IV fluids Consult IM, Dhungel will see in ED for Consult. Patient admitted by Dr. Cydney Ok for hyperglycemia. Final diagnoses:  None        Verl Dicker, PA-C 07/24/14 1743  Tanna Furry, MD 07/31/14 [phone]

## 2014-07-23 NOTE — Consult Note (Signed)
Requesting physician: Dr Jeneen Rinks  Reason for consultation: hyperglycemia    History of Present Illness: 54 year old African-American obese male with history of hypertension, CHF, Fabry's disease, history of Mobitz type II AV block with syncope status post PPM , chronic kidney disease stage II-III with baseline creatinine around 1.9. chronic lymphedema of bilateral lower extremities, uncontrolled type 2 diabetes mellitus was recently hospitalized for hyperosmolar nonketotic state and discharged on Levemir 22 units at bedtime. He reports taking his insulin regularly and when checked this morning it read "high"  and went to see his PCP. At the PCP office it again read as "high" and was sent to the ED. Patient reports some lightheadedness and polyuria. Denies tingling or numbness of his extremities. Denies dizziness or nausea, vomiting, polydipsia, headaches, blurred vision, chest pain, palpitations, shortness of breath, abdominal pain. Reports being constipated for the past 4 days. Patient was on glyburide at home prior to recent hospitalization which was discontinued. Patient reports that he has started being compliant with his diet.  In the ED patient's vitals were stable. Blood will done showed invasive 11.4, normal hemoglobin and platelets. Chemistry showed sodium of 132, potassium of 5.3, chloride of 97, CO2 26 with anion gap of 9, creatinine 1.78 (at baseline) and blood glucose of 735. Patient was given 1 L of IV normal saline bolus and hospitalist consulted. Patient reports feeling better after receiving IV fluid.   Allergies:   Allergies  Allergen Reactions  . Shellfish Allergy Hives and Swelling      Past Medical History  Diagnosis Date  . Second degree Mobitz II AV block     with syncope, s/p PPM  . Hypertension   . Bifascicular block   . Diabetes mellitus   . Pacemaker     02/12/11  . Lymphedema of lower extremity LEFT  >  RIGHT    USES PUMP AT HOME-- DOES NOT WEAR HOSE  . Short  of breath on exertion   . History of cellulitis of skin with lymphangitis LEFT LEG  . Diastolic heart failure secondary to hypertrophic cardiomyopathy CARDIOLOGIST-- DR Daneen Schick  . Gout, arthritis 11/2011    bil feet. right worse  . Seasonal asthma NO INHALERS  . Fall 07/2013  . Edema 07/2013  . CHF (congestive heart failure) 2010  . Fabry's disease RENAL AND CARDIAC INVOVLEMENT    FOLLOWED DR [place]  . Chronic renal insufficiency     Past Surgical History  Procedure Laterality Date  . Pacemaker insertion  02/12/11    SJM implanted by Dr Rayann Heman for Mobitz II second degree AV block and syncope  . Cardiac catheterization  03-12-2011  DR Daneen Schick    HYPERTROPHIC CARDIOMYOPATHY WITH LV CAVITY  APPEARANCE CONSISTANT WITH SIGNIFICANT APICAL HYPERTROPHY/ NORMAL LVSF / EF 55%/ EVIDENCE OF DIASTOLIC DYSFUNCTION WITH EDP OF 23-83mmHg AFTER A-WAVE/ NORMAL CORONARY ARTERIES  . Umbilical hernia repair  03/22/2012    Procedure: HERNIA REPAIR UMBILICAL ADULT;  Surgeon: Leighton Ruff, MD;  Location: [facility];  Service: General;  Laterality: N/A;  Umbilical Hernia Repair   . Hernia repair  0000000    Umbilical Hernia Repair  . Irrigation and debridement abscess N/A 07/27/2013    Procedure: IRRIGATION AND DEBRIDEMENT OF SKIN, SOFT TISSUE AND MUSCLES OF UPPER BACK (11X19X4cm) WITH 10 BLADE AND PULSATILE LAVAGE ;  Surgeon: Gayland Curry, MD;  Location: [facility];  Service: General;  Laterality: N/A;  . Cyst removed from back  07/2013    Medications:  Scheduled Meds: Continuous  Infusions: PRN Meds:.  Social History:  reports that he has never smoked. He has never used smokeless tobacco. He reports that he drinks alcohol. He reports that he does not use illicit drugs.  Family History  Problem Relation Age of Onset  . Colon cancer Neg Hx     Review of Systems:  Constitutional: Denies fever, chills, diaphoresis, appetite change and fatigue.  HEENT: Denies photophobia, eye pain, blurred vision,   hearing loss, ear pain, congestion, sore throat, , trouble swallowing, neck pain,  Respiratory: Denies SOB, DOE, cough, chest tightness,  and wheezing.   Cardiovascular: Denies chest pain, palpitations and leg swelling.  Gastrointestinal: constipation++,   Denies nausea, vomiting,  diarrhea,  blood in stool and abdominal distention.  Genitourinary: Denies dysuria, urgency, frequency, hematuria, flank pain and difficulty urinating.  Endocrine: Denies: hot or cold intolerance, polyuria+, denies polydipsia. Musculoskeletal: Denies myalgias, back pain, joint pain  Skin: Denies  rash and wound.  Neurological: Dizziness +, Denies  seizures, syncope, weakness, light-headedness, numbness and headaches.  Psychiatric/Behavioral: Denies confusion  Physical Exam:  Filed Vitals:   07/23/14 1125  BP: 123/80  Pulse: 86  Temp: 98.5 F (36.9 C)  TempSrc: Oral  Resp: 20  SpO2: 98%    No intake or output data in the 24 hours ending 07/23/14 1317  General: Middle aged obese male in no acute distress HEENT: Pallor, no icterus, moist oral mucosa, supple neck Heart: Regular rate and rhythm, without murmurs, rubs, gallops. Lungs: Clear to auscultation bilaterally. No added sounds GI: Soft, nontender, nondistended, positive bowel sounds. Extremities: Warm, chronic trace lymphedema EMS: Alert and oriented, nonfocal  Labs on Admission:  CBC:    Component Value Date/Time   WBC 11.4* 07/23/2014 1123   HGB 16.2 07/23/2014 1123   HCT 46.8 07/23/2014 1123   PLT 216 07/23/2014 1123   MCV 87.3 07/23/2014 1123   NEUTROABS 7.4 07/23/2014 1123   LYMPHSABS 2.6 07/23/2014 1123   MONOABS 1.3* 07/23/2014 1123   EOSABS 0.1 07/23/2014 1123   BASOSABS 0.0 07/23/2014 [id number]    Basic Metabolic Panel:    Component Value Date/Time   NA 132* 07/23/2014 1123   K 5.3* 07/23/2014 1123   CL 97 07/23/2014 1123   CO2 26 07/23/2014 1123   BUN 17 07/23/2014 1123   CREATININE 1.78* 07/23/2014 1123   GLUCOSE 735*  07/23/2014 1123   CALCIUM 9.5 07/23/2014 1123    Radiological Exams on Admission: No results found.  Assessment/Plan Uncontrolled type 2 diabetes mellitus with hyperglycemia Patient presented with elevated blood glucose. Was recently hospitalized for hyperosmolar nonketotic state and discharged on 22 units of Levemir insulin. He was only on glyburide 5 mg daily prior to hospitalization and was discontinued upon discharge. -Patient is on inadequate dose of insulin given his uncontrolled diabetes and hemoglobin A1c of 12.5. -Patient received IV normal saline in the ED. I have ordered for 15 units of aspart insulin and have his fingersticks checked in one hour. If blood glucose improves appropriately he can be discharged home. He has normal anion gap and UA negative for ketones.  - Patient reports that he will not be able to afford Levemir so I will discharge him on Novolin 70/30, 20 units twice daily. I would also give him a prescription for glyburide 5 mg daily. -I have instructed the patient to keep a log of his blood glucose monitoring twice a day before meals and show it to his PCP during outpt follow up. -He needs to follow-up with his  PCP in one week.  constipation  prescribed senna.  Hyperkalemia Follow up with repeat labs during outpt visit.  CKD stage II-III Renal function at baseline. Follows with renal as outpt  Remaining medical issues are stable.   Time Spent on Admission: [place], Zev Blue 07/23/2014, 1:17 PM

## 2014-07-23 NOTE — ED Notes (Signed)
Md notified of blood sugar.

## 2014-07-24 DIAGNOSIS — E1165 Type 2 diabetes mellitus with hyperglycemia: Secondary | ICD-10-CM | POA: Diagnosis not present

## 2014-07-24 LAB — URINALYSIS, ROUTINE W REFLEX MICROSCOPIC
Bilirubin Urine: NEGATIVE
Glucose, UA: 500 mg/dL — AB
Hgb urine dipstick: NEGATIVE
Ketones, ur: NEGATIVE mg/dL
Leukocytes, UA: NEGATIVE
Nitrite: NEGATIVE
Protein, ur: 100 mg/dL — AB
Specific Gravity, Urine: 1.014 (ref 1.005–1.030)
Urobilinogen, UA: 0.2 mg/dL (ref 0.0–1.0)
pH: 5.5 (ref 5.0–8.0)

## 2014-07-24 LAB — BASIC METABOLIC PANEL
Anion gap: 3 — ABNORMAL LOW (ref 5–15)
BUN: 11 mg/dL (ref 6–23)
CO2: 26 mmol/L (ref 19–32)
Calcium: 8.5 mg/dL (ref 8.4–10.5)
Chloride: 109 mmol/L (ref 96–112)
Creatinine, Ser: 1.32 mg/dL (ref 0.50–1.35)
GFR calc Af Amer: 70 mL/min — ABNORMAL LOW (ref >90)
GFR calc non Af Amer: 60 mL/min — ABNORMAL LOW (ref >90)
Glucose, Bld: 107 mg/dL — ABNORMAL HIGH (ref 70–99)
Potassium: 3.8 mmol/L (ref 3.5–5.1)
Sodium: 138 mmol/L (ref 135–145)

## 2014-07-24 LAB — GLUCOSE, CAPILLARY
Glucose-Capillary: 101 mg/dL — ABNORMAL HIGH (ref 70–99)
Glucose-Capillary: 194 mg/dL — ABNORMAL HIGH (ref 70–99)

## 2014-07-24 LAB — URINE MICROSCOPIC-ADD ON

## 2014-07-24 MED ORDER — INSULIN DETEMIR 100 UNIT/ML ~~LOC~~ SOLN: 40.0000 [IU] | Freq: Every day | SUBCUTANEOUS | Status: DC

## 2014-07-24 MED ORDER — INSULIN ASPART 100 UNIT/ML ~~LOC~~ SOLN: 0.0000 [IU] | Freq: Every day | SUBCUTANEOUS | Status: DC

## 2014-07-24 MED ORDER — "INSULIN SYRINGE-NEEDLE U-100 29G X 1/2"" 0.3 ML MISC": Status: DC

## 2014-07-24 MED ORDER — INSULIN NPH ISOPHANE & REGULAR (70-30) 100 UNIT/ML ~~LOC~~ SUSP: 20.0000 [IU] | Freq: Two times a day (BID) | SUBCUTANEOUS | Status: DC

## 2014-07-24 MED ORDER — INSULIN GLARGINE 100 UNIT/ML ~~LOC~~ SOLN: 40.0000 [IU] | Freq: Every day | SUBCUTANEOUS | Status: DC

## 2014-07-24 MED ORDER — INSULIN ASPART 100 UNIT/ML ~~LOC~~ SOLN: 0.0000 [IU] | Freq: Three times a day (TID) | SUBCUTANEOUS | Status: DC

## 2014-07-24 MED ORDER — INSULIN ASPART 100 UNIT/ML ~~LOC~~ SOLN: 4.0000 [IU] | Freq: Three times a day (TID) | SUBCUTANEOUS | Status: DC

## 2014-07-24 NOTE — Progress Notes (Signed)
DC home. Understood DC instructions no questions asked

## 2014-07-24 NOTE — Progress Notes (Signed)
UR completed 

## 2014-07-24 NOTE — Care Management Note (Signed)
  Page 1 of 1   07/24/2014     2:20:16 PM CARE MANAGEMENT NOTE 07/24/2014  Patient:  [name], [name]   Account Number:  [id number]  Date Initiated:  07/24/2014  Documentation initiated by:  Magdalen Spatz  Subjective/Objective Assessment:     Action/Plan:   Anticipated DC Date:     Anticipated DC Plan:           Choice offered to / List presented to:             Status of service:   Medicare Important Message given?   (If response is "NO", the following Medicare IM given date fields will be blank) Date Medicare IM given:   Medicare IM given by:   Date Additional Medicare IM given:   Additional Medicare IM given by:    Discharge Disposition:    Per UR Regulation:    If discussed at Long Length of Stay Meetings, dates discussed:    Comments:  07-24-14 Per note patient discharging on Novolin 70/30 ( $25 at [facility]) , and Novolog. Discussed with patient , gave patient, patient assistance applications ( MD has section to complete) and co pay discount card . Patient voiced understanding and stated he will send in applications when completed. Patient states he has prescription coverage. Magdalen Spatz RN BSN

## 2014-07-24 NOTE — Progress Notes (Signed)
Nutrition Brief Note  Patient identified on the Malnutrition Screening Tool (MST) Report  Wt Readings from Last 15 Encounters:  07/23/14 252 lb 3.3 oz (114.4 kg)  07/14/14 250 lb 8 oz (113.626 kg)  06/28/14 269 lb (122.018 kg)  05/27/14 271 lb 12.8 oz (123.288 kg)  05/10/14 269 lb 4 oz (122.131 kg)  04/12/14 265 lb 4 oz (120.317 kg)  04/08/14 263 lb (119.296 kg)  02/26/14 259 lb (117.482 kg)  02/08/14 260 lb 2 oz (117.992 kg)  01/22/14 254 lb (115.214 kg)  01/08/14 254 lb 12.8 oz (115.577 kg)  12/10/13 255 lb (115.667 kg)  11/23/13 257 lb 6 oz (116.745 kg)  11/09/13 253 lb (114.76 kg)  10/26/13 258 lb 6 oz (117.21 kg)   54 year old African-American obese male with history of hypertension, CHF, Fabry's disease, history of Mobitz type II AV block with syncope status post PPM , chronic kidney disease stage II-III with baseline creatinine around 1.9. chronic lymphedema of bilateral lower extremities, uncontrolled type 2 diabetes mellitus was recently hospitalized for hyperosmolar nonketotic state and discharged on Levemir 22 units at bedtime. He reports taking his insulin regularly and when checked this morning it read "high" and went to see his PCP.  UBW between 250-270#. He has a good appetite, consuming 100% of meals. Noted likely discharge today, once glucose is adequately controlled. He has been working toward diet compliance.   Body mass index is 36.19 kg/(m^2). Patient meets criteria for obesity, class II based on current BMI.   Current diet order is Carb modified, patient is consuming approximately 100% of meals at this time. Labs and medications reviewed.   No nutrition interventions warranted at this time. If nutrition issues arise, please consult RD.   Alea Ryer A. Jimmye Norman, RD, LDN, CDE Pager: [phone] After hours Pager: [phone]

## 2014-07-24 NOTE — Discharge Summary (Signed)
Physician Discharge Summary  [name] [id number] DOB: 31-Mar-1961 DOA: 07/23/2014  PCP: Elizabeth Palau, MD  Admit date: 07/23/2014 Discharge date: 07/24/2014  Time spent: 45 minutes  Recommendations for Outpatient Follow-up:  1. Needs referral to endocrinology   Discharge Condition: stable Diet recommendation: carb modified, heart healthy  Discharge Diagnoses:  Active Problems:   Hyperglycemia due to type 2 diabetes mellitus   History of present illness:  54 year old African-American obese male with history of hypertension, CHF, Fabry's disease, history of Mobitz type II AV block with syncope status post PPM , chronic kidney disease stage II-III with baseline creatinine around 1.9. chronic lymphedema of bilateral lower extremities, uncontrolled type 2 diabetes mellitus was recently hospitalized for hyperosmolar nonketotic state and discharged on Levemir 22 units at bedtime which he did not buy as it was more than $200 .  He checked this morning it read "high" and went to see his PCP. At the PCP office it again read as "high" and was sent to the ED. Patient reports some lightheadedness and polyuria. Denies tingling or numbness of his extremities. Denies dizziness or nausea, vomiting, polydipsia, headaches, blurred vision, chest pain, palpitations, shortness of breath, abdominal pain. Reports being constipated for the past 4 days. Patient was on glyburide at home prior to recent hospitalization which was discontinued. Patient reports that he has started being compliant with his diet.  In the ED patient's vitals were stable. Blood will done showed invasive 11.4, normal hemoglobin and platelets. Chemistry showed sodium of 132, potassium of 5.3, chloride of 97, CO2 26 with anion gap of 9, creatinine 1.78 (at baseline) and blood glucose of 735. Patient was given 1 L of IV normal saline bolus and hospitalist consulted. Patient reports feeling better after receiving IV fluid.     Hospital Course:  IDDM - previoulsy was unable to by the Levemir he was prescribed - HbA1c was 12.3 on 2/6 -hyperglycemia resolved on Levemir and Novolog with glucose of 101 and 194 today - I have personally checked with his pharmacy- both Lantus and Levemir are too expensive for him- Lantus is > $100 and Levemir > $200 - I have therefore asked the case managers to see if they can give him assistance- If he is not able to afford them, I have also given a prescription for Novolin 70/30.  - he is advised to check his sugar 3-4 x day and f/u with his PCP for further insulin adjustment    Discharge Exam: Filed Weights   07/23/14 1945  Weight: 114.4 kg (252 lb 3.3 oz)   Filed Vitals:   07/24/14 1420  BP: 124/78  Pulse: 71  Temp: 98.1 F (36.7 C)  Resp: 18    General: AAO x 3, no distress Cardiovascular: RRR, no murmurs  Respiratory: clear to auscultation bilaterally GI: soft, non-tender, non-distended, bowel sound positive  Discharge Instructions You were cared for by a hospitalist during your hospital stay. If you have any questions about your discharge medications or the care you received while you were in the hospital after you are discharged, you can call the unit and asked to speak with the hospitalist on call if the hospitalist that took care of you is not available. Once you are discharged, your primary care physician will handle any further medical issues. Please note that NO REFILLS for any discharge medications will be authorized once you are discharged, as it is imperative that you return to your primary care physician (or establish a relationship with a primary care  physician if you do not have one) for your aftercare needs so that they can reassess your need for medications and monitor your lab values.      Discharge Instructions    Diet - low sodium heart healthy    Complete by:  As directed   And diabetic diet     Increase activity slowly    Complete by:  As  directed             Medication List    STOP taking these medications        glyBURIDE 5 MG tablet  Commonly known as:  DIABETA      TAKE these medications        acetaminophen 500 MG tablet  Commonly known as:  TYLENOL  Only taking 1 hour before infusion.  AS premed for Fabrazyme     ALBUTEROL SULFATE HFA IN  Inhale 1 puff into the lungs daily as needed (shortness of breath or wheezing).     allopurinol 300 MG tablet  Commonly known as:  ZYLOPRIM  Take 300 mg by mouth daily.     aspirin 81 MG tablet  Take 81 mg by mouth daily.     colchicine 0.6 MG tablet  Take 0.6 mg by mouth daily.     eplerenone 50 MG tablet  Commonly known as:  INSPRA  Take 50 mg by mouth daily.     FABRAZYME IV  Inject into the vein every 14 (fourteen) days.     fexofenadine 180 MG tablet  Commonly known as:  ALLEGRA  Take 180 mg by mouth every 14 (fourteen) days.     insulin aspart 100 UNIT/ML injection  Commonly known as:  novoLOG  Inject 0-15 Units into the skin 3 (three) times daily with meals.     insulin aspart 100 UNIT/ML injection  Commonly known as:  novoLOG  Inject 0-5 Units into the skin at bedtime.     insulin aspart 100 UNIT/ML injection  Commonly known as:  novoLOG  Inject 4 Units into the skin 3 (three) times daily with meals.     insulin detemir 100 UNIT/ML injection  Commonly known as:  LEVEMIR  Inject 0.4 mLs (40 Units total) into the skin at bedtime.     insulin NPH-regular Human (70-30) 100 UNIT/ML injection  Commonly known as:  NOVOLIN 70/30  Inject 20 Units into the skin 2 (two) times daily with a meal. Take if Levimir is not approved     Insulin Syringe-Needle U-100 29G X 1/2" 0.3 ML Misc  Dispense syringes and needles as needed- any brand is OK     senna 8.6 MG Tabs tablet  Commonly known as:  SENOKOT  Take 2 tablets (17.2 mg total) by mouth at bedtime as needed for mild constipation.       Allergies  Allergen Reactions  . Shellfish Allergy Hives  and Swelling      The results of significant diagnostics from this hospitalization (including imaging, microbiology, ancillary and laboratory) are listed below for reference.    Significant Diagnostic Studies: Dg Chest 2 View  07/12/2014   CLINICAL DATA:  Hyperglycemia, dizziness, nausea.  EXAM: CHEST  2 VIEW  COMPARISON:  07/30/2013  FINDINGS: Left pacer remains in place, unchanged. Heart is upper limits normal in size. Lungs are clear. No effusions or pneumothorax. No acute bony abnormality.  IMPRESSION: No active cardiopulmonary disease.   Electronically Signed   By: Rolm Baptise M.D.   On: 07/12/2014 10:24  Microbiology: No results found for this or any previous visit (from the past 240 hour(s)).   Labs: Basic Metabolic Panel:  Recent Labs Lab 07/23/14 1123 07/23/14 1504 07/24/14 0502  NA 132* 136 138  K 5.3* 4.4 3.8  CL 97 103 109  CO2 26 22 26   GLUCOSE 735* 548* 107*  BUN 17 13 11   CREATININE 1.78* 1.55* 1.32  CALCIUM 9.5 8.7 8.5   Liver Function Tests:  Recent Labs Lab 07/23/14 1123 07/23/14 1504  AST 29 26  ALT 29 28  ALKPHOS 131* 123*  BILITOT 0.6 0.6  PROT 8.0 7.2  ALBUMIN 3.1* 2.8*   No results for input(s): LIPASE, AMYLASE in the last 168 hours. No results for input(s): AMMONIA in the last 168 hours. CBC:  Recent Labs Lab 07/23/14 1123  WBC 11.4*  NEUTROABS 7.4  HGB 16.2  HCT 46.8  MCV 87.3  PLT 216   Cardiac Enzymes: No results for input(s): CKTOTAL, CKMB, CKMBINDEX, TROPONINI in the last 168 hours. BNP: BNP (last 3 results) No results for input(s): BNP in the last 8760 hours.  ProBNP (last 3 results) No results for input(s): PROBNP in the last 8760 hours.  CBG:  Recent Labs Lab 07/23/14 1441 07/23/14 1717 07/23/14 2000 07/24/14 0742 07/24/14 1156  GLUCAP >600* 415* 398* 101* 194*       SignedDebbe Odea, MD [facility] 07/24/2014, 3:41 PM

## 2014-07-24 NOTE — Progress Notes (Signed)
Inpatient Diabetes Program Recommendations  AACE/ADA: New Consensus Statement on Inpatient Glycemic Control (2013)  Target Ranges:  Prepandial:   less than 140 mg/dL      Peak postprandial:   less than 180 mg/dL (1-2 hours)      Critically ill patients:  140 - 180 mg/dL    Results for [name], [name] (MRN [id number]) as of 07/24/2014 12:38  Ref. Range 07/24/2014 07:42 07/24/2014 11:56  Glucose-Capillary Latest Range: 70-99 mg/dL 101 (H) 194 (H)   Reason for Visit: HHNK  Diabetes history: DM 2 Current Inpatient Medications: Levemir 40 units QHS, Novolog 0-15 units TID, Novolog 0-5 units QHS, Novolog 4 units TID for meal coverage  Inpatient Diabetes Program Recommendations  Patient's trends look under control with Levemir 40 unit dose. Glucose 101 and 194 mg/dl.  Insulin - Basal: Noted patient concerned on the cost of Levemir at time of D/C. Per MD, Pt to be d/c'd on Novolin 70/30. The equivalant dose ot the 70/30 to the amount of Levemir they are currently recieving would be Novolin 70/30 25 units BID (35 units for basal and 15 units for Meal Coverage). This also takes into consideration the plans from the MD to discharge on Glyburide.   Thanks,  Tama Headings RN, MSN, [facility] Inpatient Diabetes Coordinator Team Pager [phone]

## 2014-07-26 ENCOUNTER — Encounter (HOSPITAL_COMMUNITY)
Admission: RE | Admit: 2014-07-26 | Discharge: 2014-07-26 | Disposition: A | Discharge status: Home / Self Care | Payer: Medicare Other | Source: Ambulatory Visit | Attending: Nephrology | Admitting: Nephrology

## 2014-07-26 ENCOUNTER — Other Ambulatory Visit (HOSPITAL_COMMUNITY): Payer: Self-pay | Admitting: Nephrology

## 2014-07-26 DIAGNOSIS — I422 Other hypertrophic cardiomyopathy: Secondary | ICD-10-CM | POA: Diagnosis not present

## 2014-07-26 DIAGNOSIS — E1100 Type 2 diabetes mellitus with hyperosmolarity without nonketotic hyperglycemic-hyperosmolar coma (NKHHC): Secondary | ICD-10-CM

## 2014-07-26 DIAGNOSIS — N183 Chronic kidney disease, stage 3 (moderate): Secondary | ICD-10-CM | POA: Diagnosis not present

## 2014-07-26 DIAGNOSIS — K611 Rectal abscess: Secondary | ICD-10-CM | POA: Diagnosis not present

## 2014-07-26 DIAGNOSIS — E7521 Fabry (-Anderson) disease: Secondary | ICD-10-CM | POA: Diagnosis not present

## 2014-07-26 DIAGNOSIS — N400 Enlarged prostate without lower urinary tract symptoms: Secondary | ICD-10-CM | POA: Diagnosis not present

## 2014-07-26 DIAGNOSIS — I5032 Chronic diastolic (congestive) heart failure: Secondary | ICD-10-CM | POA: Diagnosis not present

## 2014-07-26 DIAGNOSIS — I517 Cardiomegaly: Secondary | ICD-10-CM | POA: Diagnosis not present

## 2014-07-26 DIAGNOSIS — N179 Acute kidney failure, unspecified: Secondary | ICD-10-CM | POA: Diagnosis not present

## 2014-07-26 DIAGNOSIS — A419 Sepsis, unspecified organism: Secondary | ICD-10-CM | POA: Diagnosis not present

## 2014-07-26 DIAGNOSIS — N289 Disorder of kidney and ureter, unspecified: Secondary | ICD-10-CM | POA: Diagnosis not present

## 2014-07-26 DIAGNOSIS — L0231 Cutaneous abscess of buttock: Secondary | ICD-10-CM | POA: Diagnosis not present

## 2014-07-26 LAB — IRON AND TIBC
Iron: 20 ug/dL — ABNORMAL LOW (ref 42–165)
Saturation Ratios: 11 % — ABNORMAL LOW (ref 20–55)
TIBC: 187 ug/dL — ABNORMAL LOW (ref 215–435)
UIBC: 167 ug/dL (ref 125–400)

## 2014-07-26 LAB — FERRITIN: Ferritin: 427 ng/mL — ABNORMAL HIGH (ref 22–322)

## 2014-07-26 LAB — PHOSPHORUS: Phosphorus: 3.7 mg/dL (ref 2.3–4.6)

## 2014-07-26 LAB — GLUCOSE, CAPILLARY
Glucose-Capillary: 68 mg/dL — ABNORMAL LOW (ref 70–99)
Glucose-Capillary: 49 mg/dL — ABNORMAL LOW (ref 70–99)
Glucose-Capillary: 82 mg/dL (ref 70–99)

## 2014-07-26 MED ORDER — SODIUM CHLORIDE 0.9 % IV SOLN
INTRAVENOUS | Status: DC
  Administered 2014-07-26: 250 mL via INTRAVENOUS

## 2014-07-26 MED ORDER — SODIUM CHLORIDE 0.9 % IV SOLN
115.0000 mg | INTRAVENOUS | Status: DC
  Administered 2014-07-26: 115 mg via INTRAVENOUS
  Filled 2014-07-26: qty 23

## 2014-07-26 NOTE — Progress Notes (Signed)
Inpatient Diabetes Program Recommendations  AACE/ADA: New Consensus Statement on Inpatient Glycemic Control (2013)  Target Ranges:  Prepandial:   less than 140 mg/dL      Peak postprandial:   less than 180 mg/dL (1-2 hours)      Critically ill patients:  140 - 180 mg/dL   RN paged regarding pt's insulin orders at last hospital discharge on 07/24/2013.  Discharge orders for Novolin 70/30 20 units bid and Novolog moderate tidwc and hs.  Pt has been taking 70/30 20 units bid and checking blood sugars and recording in logbook. Pt states he will follow-up with PCP (Dr. Kennon Holter) early next week for any needed insulin adjustments. Pt will recheck with pharmacy about Novolog s/s prescription. Writer wrote moderate scale on discharge summary and reviewed.  Pt voiced understanding. Interested in OP Diabetes Education and order was placed. [place] will call to schedule appt. Answered questions and pt appears very motivated to get blood sugars under control. Has made dietary changes and we reviewed hypoglycemia s/s and treatment.  Results for [name], [name] (MRN [id number]) as of 07/26/2014 11:38  Ref. Range 07/26/2014 09:39  Glucose-Capillary Latest Range: 70-99 mg/dL 68 (L)   Discussed with RN. Thank you. Lorenda Peck, RD, LDN, CDE Inpatient Diabetes Coordinator [phone]

## 2014-07-26 NOTE — Progress Notes (Signed)
Pt arrived today for Fabrazyme infusion at 0945. States he" feels fine and his blood sugar this AM at home was "124" ". Pt had missed his last infusion 2 weeks ago due to his blood sugar being > 700 and being admitted. Today while reviewing patients medication list the patient expressed some confusion on how his insulin sliding scale is to be administered but states he has been checking his CBG 3x daily as directed . Pt also states he "went to his PCP (Dr [employer] office ) on Tuesday 07/23/14 to ask for a referral to an endocrinologist and because the doctor was not in the office the office staff told him to go to the ER". Pt states he" was admitted again overnight for his blood sugar" After this statement  I checked his CBG at this time and it was "68". Pt stated he took his Novolin 70/30 this AM and ate breakfast. Patient was given cheese and crackers and this nurse placed a call to Diabetes Educator to have her see patient . Morrison Old RD Diabetes Educator responded and spoke with patient (SEE HER NOTES)The patient did receive his Fabrazyme infusion over the usual 2 hour period but during the 2nd hour he became lethargic& cool . CBG was checked again and was "49" . Patient was given 240 ml of OJ and a meal of fish/roll and fruit. I discussed this with Morrison Old and she confirmed that a consult had been ordered for patient to go to a Diabetes Class and patient should receive a phone call setting up the time. After 30 minutes CBG was checked again and it was "89". Pt states he is feeling fine and eager to go home and that he had just received a phone call to set up his class. Pt was discharged ambulatory and verbalized understanding of instructions given to him by Morrison Old and will be calling his PCP on Monday to have a follow-up appointment.

## 2014-07-29 ENCOUNTER — Encounter (HOSPITAL_COMMUNITY): Payer: Self-pay | Admitting: *Deleted

## 2014-07-29 ENCOUNTER — Encounter (HOSPITAL_COMMUNITY)
Admission: EM | Disposition: A | Discharge status: Home Health | Payer: Self-pay | Source: Home / Self Care | Attending: Internal Medicine

## 2014-07-29 ENCOUNTER — Inpatient Hospital Stay (HOSPITAL_COMMUNITY): Payer: Medicare Other | Admitting: Certified Registered"

## 2014-07-29 ENCOUNTER — Inpatient Hospital Stay (HOSPITAL_COMMUNITY)
Admission: EM | Admit: 2014-07-29 | Discharge: 2014-08-02 | DRG: 872 | Disposition: A | Discharge status: Home Health | Payer: Medicare Other | Attending: Internal Medicine | Admitting: Internal Medicine

## 2014-07-29 ENCOUNTER — Emergency Department (HOSPITAL_COMMUNITY): Payer: Medicare Other

## 2014-07-29 DIAGNOSIS — E1129 Type 2 diabetes mellitus with other diabetic kidney complication: Secondary | ICD-10-CM | POA: Diagnosis not present

## 2014-07-29 DIAGNOSIS — I422 Other hypertrophic cardiomyopathy: Secondary | ICD-10-CM | POA: Diagnosis present

## 2014-07-29 DIAGNOSIS — E1165 Type 2 diabetes mellitus with hyperglycemia: Secondary | ICD-10-CM | POA: Diagnosis not present

## 2014-07-29 DIAGNOSIS — N183 Chronic kidney disease, stage 3 unspecified: Secondary | ICD-10-CM | POA: Diagnosis present

## 2014-07-29 DIAGNOSIS — K611 Rectal abscess: Secondary | ICD-10-CM | POA: Diagnosis not present

## 2014-07-29 DIAGNOSIS — L0231 Cutaneous abscess of buttock: Secondary | ICD-10-CM | POA: Diagnosis not present

## 2014-07-29 DIAGNOSIS — R600 Localized edema: Secondary | ICD-10-CM | POA: Diagnosis present

## 2014-07-29 DIAGNOSIS — J45909 Unspecified asthma, uncomplicated: Secondary | ICD-10-CM | POA: Diagnosis present

## 2014-07-29 DIAGNOSIS — I10 Essential (primary) hypertension: Secondary | ICD-10-CM | POA: Diagnosis present

## 2014-07-29 DIAGNOSIS — N189 Chronic kidney disease, unspecified: Secondary | ICD-10-CM | POA: Diagnosis not present

## 2014-07-29 DIAGNOSIS — J4489 Other specified chronic obstructive pulmonary disease: Secondary | ICD-10-CM | POA: Diagnosis present

## 2014-07-29 DIAGNOSIS — D631 Anemia in chronic kidney disease: Secondary | ICD-10-CM | POA: Diagnosis present

## 2014-07-29 DIAGNOSIS — I890 Lymphedema, not elsewhere classified: Secondary | ICD-10-CM | POA: Diagnosis present

## 2014-07-29 DIAGNOSIS — K604 Rectal fistula, unspecified: Secondary | ICD-10-CM | POA: Diagnosis present

## 2014-07-29 DIAGNOSIS — A419 Sepsis, unspecified organism: Principal | ICD-10-CM | POA: Diagnosis present

## 2014-07-29 DIAGNOSIS — N289 Disorder of kidney and ureter, unspecified: Secondary | ICD-10-CM | POA: Diagnosis not present

## 2014-07-29 DIAGNOSIS — I5032 Chronic diastolic (congestive) heart failure: Secondary | ICD-10-CM | POA: Diagnosis present

## 2014-07-29 DIAGNOSIS — Z950 Presence of cardiac pacemaker: Secondary | ICD-10-CM | POA: Diagnosis not present

## 2014-07-29 DIAGNOSIS — D509 Iron deficiency anemia, unspecified: Secondary | ICD-10-CM | POA: Diagnosis present

## 2014-07-29 DIAGNOSIS — E7521 Fabry (-Anderson) disease: Secondary | ICD-10-CM | POA: Diagnosis not present

## 2014-07-29 DIAGNOSIS — I129 Hypertensive chronic kidney disease with stage 1 through stage 4 chronic kidney disease, or unspecified chronic kidney disease: Secondary | ICD-10-CM | POA: Diagnosis present

## 2014-07-29 DIAGNOSIS — IMO0001 Reserved for inherently not codable concepts without codable children: Secondary | ICD-10-CM | POA: Diagnosis present

## 2014-07-29 DIAGNOSIS — N179 Acute kidney failure, unspecified: Secondary | ICD-10-CM | POA: Diagnosis present

## 2014-07-29 DIAGNOSIS — E1169 Type 2 diabetes mellitus with other specified complication: Secondary | ICD-10-CM | POA: Diagnosis present

## 2014-07-29 DIAGNOSIS — J449 Chronic obstructive pulmonary disease, unspecified: Secondary | ICD-10-CM

## 2014-07-29 DIAGNOSIS — R609 Edema, unspecified: Secondary | ICD-10-CM | POA: Diagnosis not present

## 2014-07-29 DIAGNOSIS — E669 Obesity, unspecified: Secondary | ICD-10-CM

## 2014-07-29 DIAGNOSIS — Z794 Long term (current) use of insulin: Secondary | ICD-10-CM

## 2014-07-29 DIAGNOSIS — N400 Enlarged prostate without lower urinary tract symptoms: Secondary | ICD-10-CM | POA: Diagnosis not present

## 2014-07-29 DIAGNOSIS — I517 Cardiomegaly: Secondary | ICD-10-CM | POA: Diagnosis not present

## 2014-07-29 DIAGNOSIS — N182 Chronic kidney disease, stage 2 (mild): Secondary | ICD-10-CM | POA: Diagnosis not present

## 2014-07-29 HISTORY — PX: INCISION AND DRAINAGE PERIRECTAL ABSCESS: SHX1804

## 2014-07-29 HISTORY — DX: Bell's palsy: G51.0

## 2014-07-29 LAB — BASIC METABOLIC PANEL
Anion gap: 8 (ref 5–15)
BUN: 17 mg/dL (ref 6–23)
CO2: 24 mmol/L (ref 19–32)
Calcium: 8.1 mg/dL — ABNORMAL LOW (ref 8.4–10.5)
Chloride: 106 mmol/L (ref 96–112)
Creatinine, Ser: 1.69 mg/dL — ABNORMAL HIGH (ref 0.50–1.35)
GFR calc Af Amer: 52 mL/min — ABNORMAL LOW (ref >90)
GFR calc non Af Amer: 45 mL/min — ABNORMAL LOW (ref >90)
Glucose, Bld: 134 mg/dL — ABNORMAL HIGH (ref 70–99)
Potassium: 5.1 mmol/L (ref 3.5–5.1)
Sodium: 138 mmol/L (ref 135–145)

## 2014-07-29 LAB — GLUCOSE, CAPILLARY
Glucose-Capillary: 87 mg/dL (ref 70–99)
Glucose-Capillary: 77 mg/dL (ref 70–99)
Glucose-Capillary: 90 mg/dL (ref 70–99)
Glucose-Capillary: 238 mg/dL — ABNORMAL HIGH (ref 70–99)

## 2014-07-29 LAB — CBC WITH DIFFERENTIAL/PLATELET
Basophils Absolute: 0 10*3/uL (ref 0.0–0.1)
Basophils Relative: 0 % (ref 0–1)
Eosinophils Absolute: 0 10*3/uL (ref 0.0–0.7)
Eosinophils Relative: 0 % (ref 0–5)
HCT: 39.4 % (ref 39.0–52.0)
Hemoglobin: 12.8 g/dL — ABNORMAL LOW (ref 13.0–17.0)
Lymphocytes Relative: 13 % (ref 12–46)
Lymphs Abs: 2.1 10*3/uL (ref 0.7–4.0)
MCH: 29.2 pg (ref 26.0–34.0)
MCHC: 32.5 g/dL (ref 30.0–36.0)
MCV: 89.7 fL (ref 78.0–100.0)
Monocytes Absolute: 2 10*3/uL — ABNORMAL HIGH (ref 0.1–1.0)
Monocytes Relative: 12 % (ref 3–12)
Neutro Abs: 12.4 10*3/uL — ABNORMAL HIGH (ref 1.7–7.7)
Neutrophils Relative %: 75 % (ref 43–77)
Platelets: 200 10*3/uL (ref 150–400)
RBC: 4.39 MIL/uL (ref 4.22–5.81)
RDW: 13.5 % (ref 11.5–15.5)
WBC: 16.5 10*3/uL — ABNORMAL HIGH (ref 4.0–10.5)

## 2014-07-29 LAB — MRSA PCR SCREENING: MRSA by PCR: NEGATIVE

## 2014-07-29 LAB — I-STAT CG4 LACTIC ACID, ED
Lactic Acid, Venous: 1.26 mmol/L (ref 0.5–2.0)
Lactic Acid, Venous: 1.34 mmol/L (ref 0.5–2.0)

## 2014-07-29 LAB — CBG MONITORING, ED: Glucose-Capillary: 121 mg/dL — ABNORMAL HIGH (ref 70–99)

## 2014-07-29 SURGERY — INCISION AND DRAINAGE, ABSCESS, PERIRECTAL
Anesthesia: General | Site: Buttocks | Laterality: Left

## 2014-07-29 MED ORDER — INSULIN ASPART 100 UNIT/ML ~~LOC~~ SOLN
0.0000 [IU] | SUBCUTANEOUS | Status: DC
  Administered 2014-07-29: 8 [IU] via SUBCUTANEOUS
  Administered 2014-07-30: 5 [IU] via SUBCUTANEOUS
  Administered 2014-07-30: 2 [IU] via SUBCUTANEOUS
  Administered 2014-07-30: 5 [IU] via SUBCUTANEOUS
  Administered 2014-07-31: 2 [IU] via SUBCUTANEOUS

## 2014-07-29 MED ORDER — MIDAZOLAM HCL 5 MG/5ML IJ SOLN
INTRAMUSCULAR | Status: DC | PRN
  Administered 2014-07-29: 2 mg via INTRAVENOUS

## 2014-07-29 MED ORDER — ASPIRIN 81 MG PO TABS: 81.0000 mg | ORAL_TABLET | Freq: Every day | ORAL | Status: DC

## 2014-07-29 MED ORDER — SODIUM CHLORIDE 0.9 % IV SOLN
INTRAVENOUS | Status: DC
  Administered 2014-07-29: 09:00:00 via INTRAVENOUS

## 2014-07-29 MED ORDER — VANCOMYCIN HCL IN DEXTROSE 1-5 GM/200ML-% IV SOLN
1000.0000 mg | Freq: Once | INTRAVENOUS | Status: AC
  Administered 2014-07-29: 1000 mg via INTRAVENOUS
  Filled 2014-07-29: qty 200

## 2014-07-29 MED ORDER — ONDANSETRON HCL 4 MG/2ML IJ SOLN
INTRAMUSCULAR | Status: DC | PRN
  Administered 2014-07-29: 4 mg via INTRAVENOUS

## 2014-07-29 MED ORDER — PIPERACILLIN-TAZOBACTAM 3.375 G IVPB 30 MIN
3.3750 g | Freq: Once | INTRAVENOUS | Status: AC
  Administered 2014-07-29: 3.375 g via INTRAVENOUS
  Filled 2014-07-29: qty 50

## 2014-07-29 MED ORDER — HYDROMORPHONE HCL 1 MG/ML IJ SOLN
1.0000 mg | INTRAMUSCULAR | Status: DC | PRN
  Administered 2014-07-29 – 2014-08-02 (×5): 1 mg via INTRAVENOUS
  Filled 2014-07-29 (×5): qty 1

## 2014-07-29 MED ORDER — PROPOFOL 10 MG/ML IV BOLUS
INTRAVENOUS | Status: DC | PRN
  Administered 2014-07-29: 130 mg via INTRAVENOUS

## 2014-07-29 MED ORDER — SODIUM CHLORIDE 0.9 % IV SOLN
2000.0000 mg | Freq: Once | INTRAVENOUS | Status: AC
  Administered 2014-07-29: 2000 mg via INTRAVENOUS
  Filled 2014-07-29: qty 2000

## 2014-07-29 MED ORDER — IOHEXOL 300 MG/ML  SOLN
25.0000 mL | INTRAMUSCULAR | Status: AC
  Administered 2014-07-29: 25 mL via ORAL

## 2014-07-29 MED ORDER — DEXTROSE 50 % IV SOLN
INTRAVENOUS | Status: AC
  Filled 2014-07-29: qty 50

## 2014-07-29 MED ORDER — INSULIN ASPART PROT & ASPART (70-30 MIX) 100 UNIT/ML ~~LOC~~ SUSP
20.0000 [IU] | Freq: Two times a day (BID) | SUBCUTANEOUS | Status: DC
  Administered 2014-07-29 – 2014-08-02 (×9): 20 [IU] via SUBCUTANEOUS
  Filled 2014-07-29 (×2): qty 10

## 2014-07-29 MED ORDER — FENTANYL CITRATE 0.05 MG/ML IJ SOLN
INTRAMUSCULAR | Status: AC
  Filled 2014-07-29: qty 5

## 2014-07-29 MED ORDER — VANCOMYCIN HCL IN DEXTROSE 750-5 MG/150ML-% IV SOLN
750.0000 mg | Freq: Two times a day (BID) | INTRAVENOUS | Status: DC
  Administered 2014-07-30 – 2014-07-31 (×3): 750 mg via INTRAVENOUS
  Filled 2014-07-29 (×5): qty 150

## 2014-07-29 MED ORDER — ONDANSETRON HCL 4 MG PO TABS: 4.0000 mg | ORAL_TABLET | Freq: Four times a day (QID) | ORAL | Status: DC | PRN

## 2014-07-29 MED ORDER — SODIUM CHLORIDE 0.9 % IV BOLUS (SEPSIS)
1000.0000 mL | Freq: Once | INTRAVENOUS | Status: AC
  Administered 2014-07-29: 1000 mL via INTRAVENOUS

## 2014-07-29 MED ORDER — IOHEXOL 300 MG/ML  SOLN
100.0000 mL | Freq: Once | INTRAMUSCULAR | Status: AC | PRN
  Administered 2014-07-29: 100 mL via INTRAVENOUS

## 2014-07-29 MED ORDER — HYDROMORPHONE HCL 1 MG/ML IJ SOLN: 0.2500 mg | INTRAMUSCULAR | Status: DC | PRN

## 2014-07-29 MED ORDER — ACETAMINOPHEN 160 MG/5ML PO SOLN: 325.0000 mg | ORAL | Status: DC | PRN

## 2014-07-29 MED ORDER — PIPERACILLIN-TAZOBACTAM 3.375 G IVPB
3.3750 g | Freq: Three times a day (TID) | INTRAVENOUS | Status: DC
  Administered 2014-07-29 – 2014-08-02 (×12): 3.375 g via INTRAVENOUS
  Filled 2014-07-29 (×16): qty 50

## 2014-07-29 MED ORDER — OXYCODONE HCL 5 MG/5ML PO SOLN: 5.0000 mg | Freq: Once | ORAL | Status: AC | PRN

## 2014-07-29 MED ORDER — ASPIRIN 81 MG PO CHEW
81.0000 mg | CHEWABLE_TABLET | Freq: Every day | ORAL | Status: DC
  Administered 2014-07-29 – 2014-08-02 (×5): 81 mg via ORAL
  Filled 2014-07-29 (×5): qty 1

## 2014-07-29 MED ORDER — PHENYLEPHRINE HCL 10 MG/ML IJ SOLN
INTRAMUSCULAR | Status: DC | PRN
  Administered 2014-07-29 (×2): 80 ug via INTRAVENOUS

## 2014-07-29 MED ORDER — SODIUM CHLORIDE 0.9 % IV BOLUS (SEPSIS)
500.0000 mL | Freq: Once | INTRAVENOUS | Status: AC
  Administered 2014-07-29: 500 mL via INTRAVENOUS

## 2014-07-29 MED ORDER — FENTANYL CITRATE 0.05 MG/ML IJ SOLN
INTRAMUSCULAR | Status: DC | PRN
  Administered 2014-07-29: 100 ug via INTRAVENOUS

## 2014-07-29 MED ORDER — PROPOFOL 10 MG/ML IV BOLUS
INTRAVENOUS | Status: AC
  Filled 2014-07-29: qty 20

## 2014-07-29 MED ORDER — SUCCINYLCHOLINE CHLORIDE 20 MG/ML IJ SOLN
INTRAMUSCULAR | Status: DC | PRN
  Administered 2014-07-29: 60 mg via INTRAVENOUS

## 2014-07-29 MED ORDER — ACETAMINOPHEN 325 MG PO TABS: 325.0000 mg | ORAL_TABLET | ORAL | Status: DC | PRN

## 2014-07-29 MED ORDER — POTASSIUM CHLORIDE IN NACL 20-0.9 MEQ/L-% IV SOLN
INTRAVENOUS | Status: DC
  Filled 2014-07-29 (×2): qty 1000

## 2014-07-29 MED ORDER — LIDOCAINE HCL (CARDIAC) 20 MG/ML IV SOLN
INTRAVENOUS | Status: DC | PRN
  Administered 2014-07-29: 80 mg via INTRAVENOUS

## 2014-07-29 MED ORDER — MIDAZOLAM HCL 2 MG/2ML IJ SOLN
INTRAMUSCULAR | Status: AC
  Filled 2014-07-29: qty 2

## 2014-07-29 MED ORDER — ALBUTEROL SULFATE (2.5 MG/3ML) 0.083% IN NEBU: 2.5000 mg | INHALATION_SOLUTION | RESPIRATORY_TRACT | Status: DC | PRN

## 2014-07-29 MED ORDER — ALBUTEROL SULFATE HFA 108 (90 BASE) MCG/ACT IN AERS: 2.0000 | INHALATION_SPRAY | RESPIRATORY_TRACT | Status: DC | PRN

## 2014-07-29 MED ORDER — 0.9 % SODIUM CHLORIDE (POUR BTL) OPTIME
TOPICAL | Status: DC | PRN
  Administered 2014-07-29: 1000 mL

## 2014-07-29 MED ORDER — HYDROMORPHONE HCL 1 MG/ML IJ SOLN
1.0000 mg | Freq: Once | INTRAMUSCULAR | Status: AC
  Administered 2014-07-29: 1 mg via INTRAVENOUS
  Filled 2014-07-29: qty 1

## 2014-07-29 MED ORDER — ONDANSETRON HCL 4 MG/2ML IJ SOLN: 4.0000 mg | Freq: Four times a day (QID) | INTRAMUSCULAR | Status: DC | PRN

## 2014-07-29 MED ORDER — OXYCODONE HCL 5 MG PO TABS: 5.0000 mg | ORAL_TABLET | Freq: Once | ORAL | Status: AC | PRN

## 2014-07-29 MED ORDER — ROCURONIUM BROMIDE 50 MG/5ML IV SOLN
INTRAVENOUS | Status: AC
  Filled 2014-07-29: qty 1

## 2014-07-29 SURGICAL SUPPLY — 33 items
CANISTER SUCTION 2500CC (MISCELLANEOUS) ×2 IMPLANT
CLEANER TIP ELECTROSURG 2X2 (MISCELLANEOUS) ×2 IMPLANT
COVER SURGICAL LIGHT HANDLE (MISCELLANEOUS) ×2 IMPLANT
DRSG PAD ABDOMINAL 8X10 ST (GAUZE/BANDAGES/DRESSINGS) ×2 IMPLANT
ELECT REM PT RETURN 9FT ADLT (ELECTROSURGICAL) ×2
ELECTRODE REM PT RTRN 9FT ADLT (ELECTROSURGICAL) ×1 IMPLANT
GAUZE PACKING IODOFORM 1X5 (MISCELLANEOUS) ×4 IMPLANT
GAUZE SPONGE 4X4 12PLY STRL (GAUZE/BANDAGES/DRESSINGS) ×2 IMPLANT
GAUZE SPONGE 4X4 16PLY XRAY LF (GAUZE/BANDAGES/DRESSINGS) ×2 IMPLANT
GLOVE BIOGEL PI IND STRL 7.0 (GLOVE) ×1 IMPLANT
GLOVE BIOGEL PI IND STRL 8 (GLOVE) ×1 IMPLANT
GLOVE BIOGEL PI INDICATOR 7.0 (GLOVE) ×1
GLOVE BIOGEL PI INDICATOR 8 (GLOVE) ×1
GLOVE ECLIPSE 7.5 STRL STRAW (GLOVE) ×2 IMPLANT
GOWN STRL REUS W/ TWL LRG LVL3 (GOWN DISPOSABLE) ×2 IMPLANT
GOWN STRL REUS W/TWL LRG LVL3 (GOWN DISPOSABLE) ×2
KIT BASIN OR (CUSTOM PROCEDURE TRAY) ×2 IMPLANT
KIT ROOM TURNOVER OR (KITS) ×2 IMPLANT
NS IRRIG 1000ML POUR BTL (IV SOLUTION) ×2 IMPLANT
PACK LITHOTOMY IV (CUSTOM PROCEDURE TRAY) ×2 IMPLANT
PAD ARMBOARD 7.5X6 YLW CONV (MISCELLANEOUS) ×2 IMPLANT
PENCIL BUTTON HOLSTER BLD 10FT (ELECTRODE) ×2 IMPLANT
SPONGE GAUZE 4X4 12PLY STER LF (GAUZE/BANDAGES/DRESSINGS) ×2 IMPLANT
SURGILUBE 2OZ TUBE FLIPTOP (MISCELLANEOUS) ×2 IMPLANT
SWAB COLLECTION DEVICE MRSA (MISCELLANEOUS) IMPLANT
SYR BULB 3OZ (MISCELLANEOUS) ×2 IMPLANT
TAPE CLOTH SURG 4X10 WHT LF (GAUZE/BANDAGES/DRESSINGS) ×2 IMPLANT
TOWEL OR 17X24 6PK STRL BLUE (TOWEL DISPOSABLE) ×2 IMPLANT
TOWEL OR 17X26 10 PK STRL BLUE (TOWEL DISPOSABLE) ×2 IMPLANT
TUBE ANAEROBIC SPECIMEN COL (MISCELLANEOUS) ×2 IMPLANT
TUBE CONNECTING 12X1/4 (SUCTIONS) ×2 IMPLANT
UNDERPAD 30X30 INCONTINENT (UNDERPADS AND DIAPERS) ×2 IMPLANT
YANKAUER SUCT BULB TIP NO VENT (SUCTIONS) ×2 IMPLANT

## 2014-07-29 NOTE — ED Notes (Signed)
Pt awaiting IV placement for CT scan

## 2014-07-29 NOTE — Care Management Note (Addendum)
    Page 1 of 1   08/02/2014     11:20:57 AM CARE MANAGEMENT NOTE 08/02/2014  Patient:  [name], [name]   Account Number:  [id number]  Date Initiated:  07/29/2014  Documentation initiated by:  Elissa Hefty  Subjective/Objective Assessment:   adm w sepsis     Action/Plan:   lives alone, pcp dr Ollen Gross [place]   Anticipated DC Date:  08/02/2014   Anticipated DC Plan:  [place]         Choice offered to / List presented to:  C-1 Patient        [place] arranged  HH-1 RN      [place].   Status of service:  Completed, signed off Medicare Important Message given?  YES (If response is "NO", the following Medicare IM given date fields will be blank) Date Medicare IM given:  08/02/2014 Medicare IM given by:  Magdalen Spatz Date Additional Medicare IM given:   Additional Medicare IM given by:    Discharge Disposition:  [place]  Per UR Regulation:  Reviewed for med. necessity/level of care/duration of stay  If discussed at [place] of Stay Meetings, dates discussed:    Comments:

## 2014-07-29 NOTE — ED Notes (Signed)
Attempted report 

## 2014-07-29 NOTE — Progress Notes (Signed)
Patient stable, but very sore.  No drainage currently.  Tried a lot of home remedies.  Not anticoagulated.  Has a dual chamber pacemaker.  To the OR soon.  Kathryne Eriksson. Dahlia Bailiff, MD, [place] [phone] [facility]

## 2014-07-29 NOTE — Anesthesia Preprocedure Evaluation (Addendum)
Anesthesia Evaluation  Patient identified by MRN, date of birth, ID band Patient awake    Reviewed: Allergy & Precautions, NPO status , Patient's Chart, lab work & pertinent test results  History of Anesthesia Complications Negative for: history of anesthetic complications  Airway Mallampati: I  TM Distance: >3 FB Neck ROM: Full    Dental  (+) Teeth Intact, Dental Advisory Given,    Pulmonary neg shortness of breath, sleep apnea , COPD COPD inhaler,  breath sounds clear to auscultation        Cardiovascular hypertension, Pt. on medications - angina+CHF - Past MI + dysrhythmias + pacemaker Rhythm:Irregular     Neuro/Psych negative neurological ROS  negative psych ROS   GI/Hepatic negative GI ROS, Neg liver ROS,   Endo/Other  diabetes, Well Controlled, Type 2  Renal/GU Renal InsufficiencyRenal disease     Musculoskeletal  (+) Arthritis -,   Abdominal (+)  Abdomen: soft. Bowel sounds: normal.  Peds  Hematology  (+) anemia ,   Anesthesia Other Findings   Reproductive/Obstetrics                            Anesthesia Physical Anesthesia Plan  ASA: III  Anesthesia Plan: General   Post-op Pain Management:    Induction: Intravenous  Airway Management Planned: Oral ETT  Additional Equipment: None  Intra-op Plan:   Post-operative Plan: Extubation in OR  Informed Consent: I have reviewed the patients History and Physical, chart, labs and discussed the procedure including the risks, benefits and alternatives for the proposed anesthesia with the patient or authorized representative who has indicated his/her understanding and acceptance.   Dental advisory given  Plan Discussed with: CRNA and Surgeon  Anesthesia Plan Comments:         Anesthesia Quick Evaluation

## 2014-07-29 NOTE — Progress Notes (Signed)
ANTIBIOTIC CONSULT NOTE - INITIAL  Pharmacy Consult for Vancomycin / Zosyn Indication: IRRIGATION AND DEBRIDEMENT PERIRECTAL ABSCESS   Allergies  Allergen Reactions  . Shellfish Allergy Hives and Swelling    Patient Measurements: Height: 5\' 10"  (177.8 cm) Weight: 256 lb (116.121 kg) IBW/kg (Calculated) : 73   Vital Signs: Temp: 98.8 F (37.1 C) (02/22 1215) Temp Source: Oral (02/22 1215) BP: 117/72 mmHg (02/22 1330) Pulse Rate: 71 (02/22 1330) Intake/Output from previous day:   Intake/Output from this shift: Total I/O In: 700 [P.O.:100; I.V.:600] Out: -   Labs:  Recent Labs  07/29/14 0308  WBC 16.5*  HGB 12.8*  PLT 200  CREATININE 1.69*   Estimated Creatinine Clearance: 64.5 mL/min (by C-G formula based on Cr of 1.69). No results for input(s): VANCOTROUGH, VANCOPEAK, VANCORANDOM, GENTTROUGH, GENTPEAK, GENTRANDOM, TOBRATROUGH, TOBRAPEAK, TOBRARND, AMIKACINPEAK, AMIKACINTROU, AMIKACIN in the last 72 hours.   Microbiology: Recent Results (from the past 720 hour(s))  MRSA PCR Screening     Status: None   Collection Time: 07/12/14  1:18 PM  Result Value Ref Range Status   MRSA by PCR NEGATIVE NEGATIVE Final    Comment:        The GeneXpert MRSA Assay (FDA approved for NASAL specimens only), is one component of a comprehensive MRSA colonization surveillance program. It is not intended to diagnose MRSA infection nor to guide or monitor treatment for MRSA infections.   MRSA PCR Screening     Status: None   Collection Time: 07/29/14 12:06 PM  Result Value Ref Range Status   MRSA by PCR NEGATIVE NEGATIVE Final    Comment:        The GeneXpert MRSA Assay (FDA approved for NASAL specimens only), is one component of a comprehensive MRSA colonization surveillance program. It is not intended to diagnose MRSA infection nor to guide or monitor treatment for MRSA infections.     Medical History: Past Medical History  Diagnosis Date  . Second degree Mobitz  II AV block     with syncope, s/p PPM  . Hypertension   . Bifascicular block   . Pacemaker     02/12/11  . Lymphedema of lower extremity LEFT  >  RIGHT    "using ankle high socks at home; pump doesn't work for me" (07/23/2014)  . Short of breath on exertion   . History of cellulitis of skin with lymphangitis LEFT LEG  . Diastolic heart failure secondary to hypertrophic cardiomyopathy CARDIOLOGIST-- DR Daneen Schick  . Seasonal asthma NO INHALERS  . Edema 07/2013  . CHF (congestive heart failure) 2010  . Chronic bronchitis     "seasonal; get it q yr"  . Gout, arthritis     bil feet. right worse  . Pneumonia 08/2011  . Fabry's disease RENAL AND CARDIAC INVOVLEMENT    FOLLOWED DR COLADOANTO  . Chronic kidney disease (CKD), stage II (mild)     stage II to III/notes 07/23/2014  . Type II diabetes mellitus     INSULIN DEPENDENT     Assessment: 53yom admitted with left buttock pain - s/p I&D of abcess.  WBC elevated 16.5, afebrile, Cr elevated 1.6 CrCl about 25ml/min.  Will begin empiric ABX with vancomycin, zosyn.    Goal of Therapy:  Vancomycin trough level 15-20 mcg/ml  Plan:  Zosyn 3.375GM IV q8 EI Vancomycin 2gm x1 then 750mg  IV q12  Bonnita Nasuti Pharm.D. CPP, BCPS Clinical Pharmacist [phone] 07/29/2014 4:06 PM

## 2014-07-29 NOTE — ED Notes (Signed)
CT notified pt done with contrast. 

## 2014-07-29 NOTE — ED Provider Notes (Signed)
CSN: [id number]     Arrival date & time 07/29/14  0151 History  This chart was scribed for [name] Cable, MD by Chester Holstein, ED Scribe. This patient was seen in room A08C/A08C and the patient's care was started at 2:29 AM.    Chief Complaint  Patient presents with  . Abscess    Patient is a 54 y.o. male presenting with abscess. The history is provided by the patient. No language interpreter was used.  Abscess Location:  Ano-genital Ano-genital abscess location:  L buttock Abscess quality: draining and painful   Duration:  3 days Progression:  Worsening Pain details:    Severity:  Moderate   Duration:  3 days   Timing:  Constant   Progression:  Worsening Chronicity:  New Context: diabetes   Relieved by:  Nothing Exacerbated by: palpation, movmenent. Associated symptoms: no fever and no headaches    HPI Comments: WOOD AXELSON is a 54 y.o. male with PMHx of HTN, CHF, gout, and CKD who presents to the Emergency Department complaining of boil to left buttock with onset 3 days ago. Pt notes associated pain to buttock, drainage and testicular pain. Pt with h/o NIDDM. Pt stats CBG has been 103. Pt denies headache, fever, abdominal pain, chest pain, and back pain.  Past Medical History  Diagnosis Date  . Second degree Mobitz II AV block     with syncope, s/p PPM  . Hypertension   . Bifascicular block   . Pacemaker     02/12/11  . Lymphedema of lower extremity LEFT  >  RIGHT    "using ankle high socks at home; pump doesn't work for me" (07/23/2014)  . Short of breath on exertion   . History of cellulitis of skin with lymphangitis LEFT LEG  . Diastolic heart failure secondary to hypertrophic cardiomyopathy CARDIOLOGIST-- DR Daneen Schick  . Seasonal asthma NO INHALERS  . Edema 07/2013  . CHF (congestive heart failure) 2010  . Type II diabetes mellitus   . Chronic bronchitis     "seasonal; get it q yr"  . Gout, arthritis     bil feet. right worse  . Pneumonia 08/2011  .  Fabry's disease RENAL AND CARDIAC INVOVLEMENT    FOLLOWED DR COLADOANTO  . Chronic kidney disease (CKD), stage II (mild)     stage II to III/notes 07/23/2014   Past Surgical History  Procedure Laterality Date  . Cardiac catheterization  03-12-2011  DR Daneen Schick    HYPERTROPHIC CARDIOMYOPATHY WITH LV CAVITY  APPEARANCE CONSISTANT WITH SIGNIFICANT APICAL HYPERTROPHY/ NORMAL LVSF / EF 55%/ EVIDENCE OF DIASTOLIC DYSFUNCTION WITH EDP OF 23-70mmHg AFTER A-WAVE/ NORMAL CORONARY ARTERIES  . Umbilical hernia repair  03/22/2012    Procedure: HERNIA REPAIR UMBILICAL ADULT;  Surgeon: Leighton Ruff, MD;  Location: [facility];  Service: General;  Laterality: N/A;  Umbilical Hernia Repair   . Hernia repair  0000000    Umbilical Hernia Repair  . Irrigation and debridement abscess N/A 07/27/2013    Procedure: IRRIGATION AND DEBRIDEMENT OF SKIN, SOFT TISSUE AND MUSCLES OF UPPER BACK (11X19X4cm) WITH 10 BLADE AND PULSATILE LAVAGE ;  Surgeon: Gayland Curry, MD;  Location: [place];  Service: General;  Laterality: N/A;  . Insert / replace / remove pacemaker  02/12/2011    SJM implanted by Dr Rayann Heman for Mobitz II second degree AV block and syncope   Family History  Problem Relation Age of Onset  . Colon cancer Neg Hx    History  Substance Use Topics  . Smoking status: Never Smoker   . Smokeless tobacco: Never Used  . Alcohol Use: Yes     Comment: 07/23/2014 "might have a few drinks on holidays or at cookouts"    Review of Systems  Constitutional: Negative for fever.  Cardiovascular: Negative for chest pain.  Gastrointestinal: Negative for abdominal pain.  Genitourinary: Positive for testicular pain.  Musculoskeletal: Positive for myalgias. Negative for back pain.  Skin:       Boil to L buttock  Neurological: Negative for headaches.  All other systems reviewed and are negative.     Allergies  Shellfish allergy  Home Medications   Prior to Admission medications   Medication Sig Start Date End Date  Taking? Authorizing Provider  acetaminophen (TYLENOL) 500 MG tablet Only taking 1 hour before infusion.  AS premed for Fabrazyme    Historical Provider, MD  Agalsidase beta (FABRAZYME IV) Inject into the vein every 14 (fourteen) days.     Historical Provider, MD  ALBUTEROL SULFATE HFA IN Inhale 1 puff into the lungs daily as needed (shortness of breath or wheezing).     Historical Provider, MD  allopurinol (ZYLOPRIM) 300 MG tablet Take 300 mg by mouth daily.    Historical Provider, MD  aspirin 81 MG tablet Take 81 mg by mouth daily.     Historical Provider, MD  colchicine 0.6 MG tablet Take 0.6 mg by mouth daily.    Historical Provider, MD  eplerenone (INSPRA) 50 MG tablet Take 50 mg by mouth daily.    Historical Provider, MD  fexofenadine (ALLEGRA) 180 MG tablet Take 180 mg by mouth every 14 (fourteen) days.     Historical Provider, MD  insulin aspart (NOVOLOG) 100 UNIT/ML injection Inject 0-15 Units into the skin 3 (three) times daily with meals. 07/24/14   Debbe Odea, MD  insulin aspart (NOVOLOG) 100 UNIT/ML injection Inject 0-5 Units into the skin at bedtime. 07/24/14   Debbe Odea, MD  insulin aspart (NOVOLOG) 100 UNIT/ML injection Inject 4 Units into the skin 3 (three) times daily with meals. 07/24/14   Debbe Odea, MD  insulin detemir (LEVEMIR) 100 UNIT/ML injection Inject 0.22 mLs (22 Units total) into the skin at bedtime. Patient taking differently: Inject 20 Units into the skin at bedtime.  07/14/14   Velvet Bathe, MD  insulin detemir (LEVEMIR) 100 UNIT/ML injection Inject 0.4 mLs (40 Units total) into the skin at bedtime. 07/24/14   Debbe Odea, MD  insulin NPH-regular Human (NOVOLIN 70/30) (70-30) 100 UNIT/ML injection Inject 20 Units into the skin 2 (two) times daily with a meal. Take if Levimir is not approved 07/24/14   Debbe Odea, MD  Insulin Syringe-Needle U-100 29G X 1/2" 0.3 ML MISC Dispense syringes and needles as needed- any brand is OK 07/24/14   Debbe Odea, MD  senna  (SENOKOT) 8.6 MG TABS tablet Take 2 tablets (17.2 mg total) by mouth at bedtime as needed for mild constipation. 07/23/14   [name], MD   BP 129/73 mmHg  Pulse 102  Temp(Src) 99.1 F (37.3 C) (Oral)  Resp 18  Ht 5\' 10"  (1.778 m)  Wt 257 lb (116.574 kg)  BMI 36.88 kg/m2  SpO2 99% Physical Exam  Nursing note and vitals reviewed.  CONSTITUTIONAL: Well developed/well nourished HEAD: Normocephalic/atraumatic EYES: EOMI ENMT: Mucous membranes moist NECK: supple no meningeal signs SPINE/BACK:large scar noted to upper back CV: S1/S2 noted LUNGS: Lungs are clear to auscultation bilaterally, no apparent distress ABDOMEN: soft, nontender, no rebound or  guarding, bowel sounds noted throughout abdomen GU:no cva tenderness Rectal - large abscess to left buttocks with rectal tenderness noted; no extension inTO the scrotum NEURO: Pt is awake/alert/appropriate, moves all extremitiesx4.  No facial droop.   EXTREMITIES: pulses normal/equal, full ROM SKIN: warm, color normal PSYCH: no abnormalities of mood noted, alert and oriented to situation  Examination chaperoned  ED Course  Procedures  CRITICAL CARE Performed by: [name] Total critical care time: 32 Critical care time was exclusive of separately billable procedures and treating other patients. Critical care was necessary to treat or prevent imminent or life-threatening deterioration. Critical care was time spent personally by me on the following activities: development of treatment plan with patient and/or surrogate as well as nursing, discussions with consultants, evaluation of patient's response to treatment, examination of patient, obtaining history from patient or surrogate, ordering and performing treatments and interventions, ordering and review of laboratory studies, ordering and review of radiographic studies, pulse oximetry and re-evaluation of patient's condition. PATIENT WITH SEPSIS (HR>100, WBC - 16,  GLUTEAL/PERIRECTAL ABSCESS) WITH EPISODE OF HYPOTENSION THAT REQUIRED IV FLUIDS BOLUSES AND ALSO TWO ANTIBIOTICS (VANCOMYCIN AND ZOSYN)  PT TO BE TAKE OPERATING ROOM LATER THIS MORNING PATIENT STABILIZED/IMPROVED WITH IV FLUIDS AND ANTIBIOTICS  DIAGNOSTIC STUDIES: Oxygen Saturation is 99% on room air, normal by my interpretation.    COORDINATION OF CARE: 2:36 AM Discussed treatment plan with patient at beside, the patient agrees with the plan and has no further questions at this time.  5:00 AM After Ct imaging, patient was found to have hypotension (initially no hypotension on arrival) He denies any new complaints SBP >80 IV fluids ordered Code sepsis called Abscess noted on CT imaging - gen surgery called Vancomycin already had been ordered, added on zosyn 5:18 AM D/w dr Donne Hazel with surgery He is aware of patient Will also involve medicine/Critical care due to co-morbidities 6:46 AM PT SEEN BY GEN SURGERY PLAN PER CHART IS TO TAKE TO OR LATER TODAY D/W DR Alcario Drought WITH [facility] WILL ADMIT TO STEPDOWN DUE TO EPISODES OF HYPOTENSION HIS BP IS IMPROVED (LAST CHECK SBP 97) HE IS AWAKE/ALERT.  LACTATE IS LESS THAN 2 Labs Review Labs Reviewed  BASIC METABOLIC PANEL - Abnormal; Notable for the following:    Glucose, Bld 134 (*)    Creatinine, Ser 1.69 (*)    Calcium 8.1 (*)    GFR calc non Af Amer 45 (*)    GFR calc Af Amer 52 (*)    All other components within normal limits  CBC WITH DIFFERENTIAL/PLATELET - Abnormal; Notable for the following:    WBC 16.5 (*)    Hemoglobin 12.8 (*)    Neutro Abs 12.4 (*)    Monocytes Absolute 2.0 (*)    All other components within normal limits  CBG MONITORING, ED - Abnormal; Notable for the following:    Glucose-Capillary 121 (*)    All other components within normal limits  CULTURE, BLOOD (ROUTINE X 2)  CULTURE, BLOOD (ROUTINE X 2)  I-STAT CG4 LACTIC ACID, ED    Imaging Review Ct Abdomen Pelvis W Contrast  07/29/2014   CLINICAL DATA:   Left buttock abscess.  EXAM: CT ABDOMEN AND PELVIS WITH CONTRAST  TECHNIQUE: Multidetector CT imaging of the abdomen and pelvis was performed using the standard protocol following bolus administration of intravenous contrast.  CONTRAST:  174mL OMNIPAQUE IOHEXOL 300 MG/ML  SOLN  COMPARISON:  CT 04/04/2009  FINDINGS: Minimal atelectasis in the dependent right lower lobe. Pacemaker wires are  partially included, the heart is at the upper limits of normal in size.  There is a perirectal/perianal fluid collection extending to the anorectal junction posteriorly. This is irregular in shape measuring at least 5.5 x 5.4 x 5.5 cm. Small foci of air seen within the fluid collection with peripheral enhanced. There is associated skin thickening and soft tissue edema of the left buttock. No associated deep pelvic fluid collection.  Enlarged left obturator lymph node measures 1.6 cm in short axis dimension. There is a 1.2 cm left external iliac lymph node. Additional smaller bilateral inguinal and iliac lymph nodes are seen.  Focal fatty infiltration adjacent to the falciform ligament, no focal hepatic lesion. The gallbladder is physiologically distended. The spleen, adrenal glands, and pancreas are normal. Kidneys demonstrate symmetric enhancement and excretion. No hydronephrosis or focal renal abnormality. There are no dilated or thickened bowel loops. The appendix is normal.  Abdominal aorta is normal in caliber. There is no retroperitoneal adenopathy. The urinary bladder is physiologically distended. The prostate gland is enlarged measuring 5.5 x 4.3 cm. No pelvic ascites.  Degenerative disc disease at L4-L5. Facet arthropathy in the lower lumbar spine. No acute osseous abnormality.  IMPRESSION: 1. Perianal/perirectal abscess posteriorly and left lateral. This measures approximately 5.5 cm. Prominent left obturator and iliac lymph nodes are likely reactive. 2. Enlarged prostate gland.   Electronically Signed   By: Jeb Levering M.D.   On: 07/29/2014 04:46   Medications  iohexol (OMNIPAQUE) 300 MG/ML solution 25 mL (0 mLs Oral Hold 07/29/14 0445)  piperacillin-tazobactam (ZOSYN) IVPB 3.375 g (3.375 g Intravenous New Bag/Given 07/29/14 [id number])  vancomycin (VANCOCIN) IVPB 1000 mg/200 mL premix (0 mg Intravenous Stopped 07/29/14 0602)  HYDROmorphone (DILAUDID) injection 1 mg (1 mg Intravenous Given 07/29/14 0348)  iohexol (OMNIPAQUE) 300 MG/ML solution 100 mL (100 mLs Intravenous Contrast Given 07/29/14 0400)  sodium chloride 0.9 % bolus 500 mL (0 mLs Intravenous Stopped 07/29/14 0553)  sodium chloride 0.9 % bolus 1,000 mL (1,000 mLs Intravenous New Bag/Given 07/29/14 0626)      MDM   Final diagnoses:  Perirectal abscess  Sepsis, due to unspecified organism  Renal insufficiency  Abscess, gluteal, left    Nursing notes including past medical history and social history reviewed and considered in documentation Labs/vital reviewed myself and considered during evaluation Previous records reviewed and considered   I personally performed the services described in this documentation, which was scribed in my presence. The recorded information has been reviewed and is accurate.      [name] Cable, MD 07/29/14 [phone]

## 2014-07-29 NOTE — Consult Note (Signed)
Reason for Consult:perirectal abscess Referring Physician: Dr Ruthell Rummage is an 54 y.o. male.  HPI: 71 yom with recent onset of left buttock pain resulting in hard to sit on that side.  He does not report fevers. Hard to have a bm.  No n/v. Some drainage he has noted.  Comes in due to pain that has been worsening. Underwent exam and ct that shows left buttock abscess. SBP<100 in er responsive to some volume.  Has history of skin infection on back with open wound last year.   Past Medical History  Diagnosis Date  . Second degree Mobitz II AV block     with syncope, s/p PPM  . Hypertension   . Bifascicular block   . Pacemaker     02/12/11  . Lymphedema of lower extremity LEFT  >  RIGHT    "using ankle high socks at home; pump doesn't work for me" (07/23/2014)  . Short of breath on exertion   . History of cellulitis of skin with lymphangitis LEFT LEG  . Diastolic heart failure secondary to hypertrophic cardiomyopathy CARDIOLOGIST-- DR Daneen Schick  . Seasonal asthma NO INHALERS  . Edema 07/2013  . CHF (congestive heart failure) 2010  . Type II diabetes mellitus   . Chronic bronchitis     "seasonal; get it q yr"  . Gout, arthritis     bil feet. right worse  . Pneumonia 08/2011  . Fabry's disease RENAL AND CARDIAC INVOVLEMENT    FOLLOWED DR [place]  . Chronic kidney disease (CKD), stage II (mild)     stage II to III/notes 07/23/2014    Past Surgical History  Procedure Laterality Date  . Cardiac catheterization  03-12-2011  DR Daneen Schick    HYPERTROPHIC CARDIOMYOPATHY WITH LV CAVITY  APPEARANCE CONSISTANT WITH SIGNIFICANT APICAL HYPERTROPHY/ NORMAL LVSF / EF 55%/ EVIDENCE OF DIASTOLIC DYSFUNCTION WITH EDP OF 23-36mmHg AFTER A-WAVE/ NORMAL CORONARY ARTERIES  . Umbilical hernia repair  03/22/2012    Procedure: HERNIA REPAIR UMBILICAL ADULT;  Surgeon: Leighton Ruff, MD;  Location: [facility];  Service: General;  Laterality: N/A;  Umbilical Hernia Repair   . Hernia repair  2263     Umbilical Hernia Repair  . Irrigation and debridement abscess N/A 07/27/2013    Procedure: IRRIGATION AND DEBRIDEMENT OF SKIN, SOFT TISSUE AND MUSCLES OF UPPER BACK (11X19X4cm) WITH 10 BLADE AND PULSATILE LAVAGE ;  Surgeon: Gayland Curry, MD;  Location: [place];  Service: General;  Laterality: N/A;  . Insert / replace / remove pacemaker  02/12/2011    SJM implanted by Dr Rayann Heman for Mobitz II second degree AV block and syncope    Family History  Problem Relation Age of Onset  . Colon cancer Neg Hx     Social History:  reports that he has never smoked. He has never used smokeless tobacco. He reports that he drinks alcohol. He reports that he does not use illicit drugs.  Allergies:  Allergies  Allergen Reactions  . Shellfish Allergy Hives and Swelling    Medications: I have reviewed the patient's current medications.  Results for orders placed or performed during the hospital encounter of 07/29/14 (from the past 48 hour(s))  CBG monitoring, ED     Status: Abnormal   Collection Time: 07/29/14  3:01 AM  Result Value Ref Range   Glucose-Capillary 121 (H) 70 - 99 mg/dL  Basic metabolic panel     Status: Abnormal   Collection Time: 07/29/14  3:08 AM  Result  Value Ref Range   Sodium 138 135 - 145 mmol/L   Potassium 5.1 3.5 - 5.1 mmol/L   Chloride 106 96 - 112 mmol/L   CO2 24 19 - 32 mmol/L   Glucose, Bld 134 (H) 70 - 99 mg/dL   BUN 17 6 - 23 mg/dL   Creatinine, Ser 1.69 (H) 0.50 - 1.35 mg/dL   Calcium 8.1 (L) 8.4 - 10.5 mg/dL   GFR calc non Af Amer 45 (L) >90 mL/min   GFR calc Af Amer 52 (L) >90 mL/min    Comment: (NOTE) The eGFR has been calculated using the CKD EPI equation. This calculation has not been validated in all clinical situations. eGFR's persistently <90 mL/min signify possible Chronic Kidney Disease.    Anion gap 8 5 - 15  CBC with Differential/Platelet     Status: Abnormal   Collection Time: 07/29/14  3:08 AM  Result Value Ref Range   WBC 16.5 (H) 4.0 - 10.5  K/uL   RBC 4.39 4.22 - 5.81 MIL/uL   Hemoglobin 12.8 (L) 13.0 - 17.0 g/dL   HCT 39.4 39.0 - 52.0 %   MCV 89.7 78.0 - 100.0 fL   MCH 29.2 26.0 - 34.0 pg   MCHC 32.5 30.0 - 36.0 g/dL   RDW 13.5 11.5 - 15.5 %   Platelets 200 150 - 400 K/uL   Neutrophils Relative % 75 43 - 77 %   Lymphocytes Relative 13 12 - 46 %   Monocytes Relative 12 3 - 12 %   Eosinophils Relative 0 0 - 5 %   Basophils Relative 0 0 - 1 %   Neutro Abs 12.4 (H) 1.7 - 7.7 K/uL   Lymphs Abs 2.1 0.7 - 4.0 K/uL   Monocytes Absolute 2.0 (H) 0.1 - 1.0 K/uL   Eosinophils Absolute 0.0 0.0 - 0.7 K/uL   Basophils Absolute 0.0 0.0 - 0.1 K/uL   RBC Morphology POLYCHROMASIA PRESENT    WBC Morphology TOXIC GRANULATION     Comment: DOHLE BODIES  I-Stat CG4 Lactic Acid, ED (not at [facility])     Status: None   Collection Time: 07/29/14  6:05 AM  Result Value Ref Range   Lactic Acid, Venous 1.26 0.5 - 2.0 mmol/L    Ct Abdomen Pelvis W Contrast  07/29/2014   CLINICAL DATA:  Left buttock abscess.  EXAM: CT ABDOMEN AND PELVIS WITH CONTRAST  TECHNIQUE: Multidetector CT imaging of the abdomen and pelvis was performed using the standard protocol following bolus administration of intravenous contrast.  CONTRAST:  126mL OMNIPAQUE IOHEXOL 300 MG/ML  SOLN  COMPARISON:  CT 04/04/2009  FINDINGS: Minimal atelectasis in the dependent right lower lobe. Pacemaker wires are partially included, the heart is at the upper limits of normal in size.  There is a perirectal/perianal fluid collection extending to the anorectal junction posteriorly. This is irregular in shape measuring at least 5.5 x 5.4 x 5.5 cm. Small foci of air seen within the fluid collection with peripheral enhanced. There is associated skin thickening and soft tissue edema of the left buttock. No associated deep pelvic fluid collection.  Enlarged left obturator lymph node measures 1.6 cm in short axis dimension. There is a 1.2 cm left external iliac lymph node. Additional smaller bilateral  inguinal and iliac lymph nodes are seen.  Focal fatty infiltration adjacent to the falciform ligament, no focal hepatic lesion. The gallbladder is physiologically distended. The spleen, adrenal glands, and pancreas are normal. Kidneys demonstrate symmetric enhancement and excretion. No hydronephrosis  or focal renal abnormality. There are no dilated or thickened bowel loops. The appendix is normal.  Abdominal aorta is normal in caliber. There is no retroperitoneal adenopathy. The urinary bladder is physiologically distended. The prostate gland is enlarged measuring 5.5 x 4.3 cm. No pelvic ascites.  Degenerative disc disease at L4-L5. Facet arthropathy in the lower lumbar spine. No acute osseous abnormality.  IMPRESSION: 1. Perianal/perirectal abscess posteriorly and left lateral. This measures approximately 5.5 cm. Prominent left obturator and iliac lymph nodes are likely reactive. 2. Enlarged prostate gland.   Electronically Signed   By: Jeb Levering M.D.   On: 07/29/2014 04:46    Review of Systems  Constitutional: Negative for fever and chills.  Respiratory: Negative for shortness of breath.   Cardiovascular: Negative for chest pain.  Gastrointestinal: Positive for constipation. Negative for nausea, vomiting and abdominal pain.   Blood pressure 94/54, pulse 75, temperature 99.1 F (37.3 C), temperature source Oral, resp. rate 18, height $RemoveBe'5\' 10"'bAWZpRShs$  (1.778 m), weight 257 lb (116.574 kg), SpO2 91 %. Physical Exam  Vitals reviewed. Constitutional: He appears well-developed and well-nourished.  Neck: Neck supple.  Cardiovascular: Normal rate and regular rhythm.   Respiratory: Effort normal and breath sounds normal.  GI: Soft. There is no tenderness.  Genitourinary:  Left buttock abscess with signficant surrounding induration     Assessment/Plan: Left gluteal abscess  Will need incision and drainage in the or this am. Discussed plan with patient  [facility] 07/29/2014, 6:10 AM

## 2014-07-29 NOTE — ED Notes (Signed)
Blood cultures attempted by this RN without success; phlebotomy aware to attempt cultures

## 2014-07-29 NOTE — ED Notes (Signed)
CODE SEPSIS CALLED @0502 

## 2014-07-29 NOTE — Transfer of Care (Signed)
Immediate Anesthesia Transfer of Care Note  [name]  Procedure(s) Performed: Procedure(s) with comments: IRRIGATION AND DEBRIDEMENT PERIRECTAL ABSCESS (Left) - Prone position  Patient Location: PACU  Anesthesia Type:General  Level of Consciousness: awake, alert  and oriented  Airway & Oxygen Therapy: Patient Spontanous Breathing  Post-op Assessment: Report given to RN and Post -op Vital signs reviewed and stable  Post vital signs: Reviewed and stable  Last Vitals:  Filed Vitals:   07/29/14 1050  BP:   Pulse: 87  Temp: 37.3 C  Resp:     Complications: No apparent anesthesia complications

## 2014-07-29 NOTE — ED Notes (Signed)
The pt ha shad an abscess for 4 days on his buttocks,  No previous history

## 2014-07-29 NOTE — Anesthesia Procedure Notes (Signed)
Procedure Name: Intubation Date/Time: 07/29/2014 9:43 AM Performed by: Manuela Schwartz B Pre-anesthesia Checklist: Patient identified, Emergency Drugs available, Suction available, Patient being monitored and Timeout performed Patient Re-evaluated:Patient Re-evaluated prior to inductionOxygen Delivery Method: Circle system utilized Preoxygenation: Pre-oxygenation with 100% oxygen Intubation Type: IV induction and Rapid sequence Laryngoscope Size: Mac and 4 Grade View: Grade I Tube type: Oral Tube size: 7.5 mm Number of attempts: 1 Airway Equipment and Method: Stylet Placement Confirmation: ETT inserted through vocal cords under direct vision,  positive ETCO2 and breath sounds checked- equal and bilateral Secured at: 22 cm Tube secured with: Tape Dental Injury: Teeth and Oropharynx as per pre-operative assessment

## 2014-07-29 NOTE — Anesthesia Postprocedure Evaluation (Signed)
  Anesthesia Post-op Note  [name]  Procedure(s) Performed: Procedure(s) with comments: IRRIGATION AND DEBRIDEMENT PERIRECTAL ABSCESS (Left) - Prone position  Patient Location: PACU  Anesthesia Type:General  Level of Consciousness: awake  Airway and Oxygen Therapy: Patient Spontanous Breathing  Post-op Pain: mild  Post-op Assessment: Post-op Vital signs reviewed, Patient's Cardiovascular Status Stable, Respiratory Function Stable, Patent Airway, No signs of Nausea or vomiting and Pain level controlled  Post-op Vital Signs: Reviewed and stable  Last Vitals:  Filed Vitals:   07/29/14 1330  BP: 117/72  Pulse: 71  Temp:   Resp: 17    Complications: No apparent anesthesia complications

## 2014-07-29 NOTE — Progress Notes (Signed)
Report given to robin roberts rn as caregiver 

## 2014-07-29 NOTE — ED Notes (Signed)
Christy Gentles, MD notified of pt's hypotension.

## 2014-07-29 NOTE — ED Notes (Addendum)
Dr. Hulen Skains at bedside, requests that pt remain in ED until transportation to OR at 8:30. Dreama Rice, RN has called report to the floor RNs and made them aware

## 2014-07-29 NOTE — Op Note (Signed)
OPERATIVE REPORT  DATE OF OPERATION: 07/29/2014  PATIENT:  [name]  54 y.o. male  PRE-OPERATIVE DIAGNOSIS:  Large perirectal abscess  POST-OPERATIVE DIAGNOSIS:  Large perirectal abscess with likely fistula   PROCEDURE:  Procedure(s): IRRIGATION AND DEBRIDEMENT PERIRECTAL ABSCESS  SURGEON:  Surgeon(s): Doreen Salvage, MD  ASSISTANT: None  ANESTHESIA:   general  EBL: <20 ml  BLOOD ADMINISTERED: none  DRAINS: 2 bottle of 1" iodoform Nugauze packed in the wound   SPECIMEN:  Source of Specimen:  fluid or aerobic and anaerobic cultures  COUNTS CORRECT:  YES  PROCEDURE DETAILS: The patient was taken to the operating room and initially placed on the table in the supine position. After an adequate general endotracheal anesthetic was administered he was prepped after being placed in the prone position exposing his gluteal area and perirectal area on the left side.  A proper timeout was performed identifying the patient and the procedure to be performed. Prior to making any incision leakage of purulent debris came from the anus had a fistula tract which appeared to be on the left side. We incised deep into the infected cavity on the left side using a #10 blade were approximately 50-75 mL of mucopurulent drainage was obtained. After initially suctioning out the pus using a Yankauer suction tip we irrigated the cavity and used to surgeon's finger to break up any loculations. The pocket appeared to cross the midline posteriorly however there was no induration on the right side.  We subsequently packed the entire wound using 2 full bottles of 1 inch iodoform Nu Gauze. A sterile dressing was applied. All needle counts, sponge counts, and instrument counts were correct.  PATIENT DISPOSITION:  PACU - hemodynamically stable.   Lavora Brisbon, JAY 2/22/201610:32 AM

## 2014-07-29 NOTE — H&P (Signed)
[facility] History and Physical                                                                                    [name], is a 54 y.o. male  MRN: [id number]   DOB - 19-Jan-1961  Admit Date - 07/29/2014  Outpatient Primary MD for the patient is Elizabeth Palau, MD  With History of -  Past Medical History  Diagnosis Date  . Second degree Mobitz II AV block     with syncope, s/p PPM  . Hypertension   . Bifascicular block   . Pacemaker     02/12/11  . Lymphedema of lower extremity LEFT  >  RIGHT    "using ankle high socks at home; pump doesn't work for me" (07/23/2014)  . Short of breath on exertion   . History of cellulitis of skin with lymphangitis LEFT LEG  . Diastolic heart failure secondary to hypertrophic cardiomyopathy CARDIOLOGIST-- DR Daneen Schick  . Seasonal asthma NO INHALERS  . Edema 07/2013  . CHF (congestive heart failure) 2010  . Type II diabetes mellitus   . Chronic bronchitis     "seasonal; get it q yr"  . Gout, arthritis     bil feet. right worse  . Pneumonia 08/2011  . Fabry's disease RENAL AND CARDIAC INVOVLEMENT    FOLLOWED DR [place]  . Chronic kidney disease (CKD), stage II (mild)     stage II to III/notes 07/23/2014      Past Surgical History  Procedure Laterality Date  . Cardiac catheterization  03-12-2011  DR Daneen Schick    HYPERTROPHIC CARDIOMYOPATHY WITH LV CAVITY  APPEARANCE CONSISTANT WITH SIGNIFICANT APICAL HYPERTROPHY/ NORMAL LVSF / EF 55%/ EVIDENCE OF DIASTOLIC DYSFUNCTION WITH EDP OF 23-82mmHg AFTER A-WAVE/ NORMAL CORONARY ARTERIES  . Umbilical hernia repair  03/22/2012    Procedure: HERNIA REPAIR UMBILICAL ADULT;  Surgeon: Leighton Ruff, MD;  Location: [facility];  Service: General;  Laterality: N/A;  Umbilical Hernia Repair   . Hernia repair  0000000    Umbilical Hernia Repair  . Irrigation and debridement abscess N/A 07/27/2013    Procedure: IRRIGATION AND DEBRIDEMENT OF SKIN, SOFT TISSUE AND MUSCLES OF UPPER BACK (11X19X4cm)  WITH 10 BLADE AND PULSATILE LAVAGE ;  Surgeon: Gayland Curry, MD;  Location: [place];  Service: General;  Laterality: N/A;  . Insert / replace / remove pacemaker  02/12/2011    SJM implanted by Dr Rayann Heman for Mobitz II second degree AV block and syncope    in for   Chief Complaint  Patient presents with  . Abscess     HPI [name]  is a 54 y.o. 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 Discharged 2/17 after being admitted for severe hypoglycemia precipitated by inability to purchase Levemir he was previously prescribed. He returns to the ER now with 3 days of perirectal pain radiating to the left gluteal area with firmness; this was associated with swelling and pain with wiping post bowel movement. He denied fevers or chills or any drainage.  In the ER  he was afebrile but his blood pressures were somewhat soft with systolic readings in the 0000000 that improved after IV fluid bolus. White count was 16,500. Exam was concerning for perirectal abscess. CT the abdomen and pelvis was obtained demonstrated a 5 cm fluid collection in the perirectal. He anal region extended to the anal renal junction there was an associated skin thickening and soft tissue edema of the left buttock. There was no deep pelvic fluid collection. General surgery was consulted by the EDP and requested internal medicine admit the patient. Answer to pursue surgical intervention today.  Review of Systems   In addition to the HPI above,  No Fever-chills, myalgias or other constitutional symptoms No Headache, changes with Vision or hearing, new weakness, tingling, numbness in any extremity, No problems swallowing food or Liquids, indigestion/reflux No Chest pain, Cough or Shortness of Breath, palpitations, orthopnea or DOE; no change in chronic lower extremity edema No Abdominal pain, N/V; no  melena or hematochezia, no dark tarry stools, Bowel movements are regular, No dysuria, hematuria or flank pain No new skin rashes, lesions, masses or bruises, No new joints pains-aches No recent weight gain or loss No polyuria, polydypsia or polyphagia,  *A full 10 point Review of Systems was done, except as stated above, all other Review of Systems were negative.  Social History History  Substance Use Topics  . Smoking status: Never Smoker   . Smokeless tobacco: Never Used  . Alcohol Use: Yes     Comment: 07/23/2014 "might have a few drinks on holidays or at cookouts"    Family History Family History  Problem Relation Age of Onset  . CVA  Diabetes  Tobacco abuse  HIV /AIDS Mother  Mother  Mother  Father  (deceased )     Prior to Admission medications   Medication Sig Start Date End Date Taking? Authorizing Provider  acetaminophen (TYLENOL) 500 MG tablet Only taking 1 hour before infusion.  AS premed for Fabrazyme   Yes Historical Provider, MD  Agalsidase beta (FABRAZYME IV) Inject into the vein every 14 (fourteen) days.    Yes Historical Provider, MD  ALBUTEROL SULFATE HFA IN Inhale 1 puff into the lungs daily as needed (shortness of breath or wheezing).    Yes Historical Provider, MD  allopurinol (ZYLOPRIM) 300 MG tablet Take 300 mg by mouth daily.   Yes Historical Provider, MD  aspirin 81 MG tablet Take 81 mg by mouth daily.    Yes Historical Provider, MD  colchicine 0.6 MG tablet Take 0.6 mg by mouth daily.   Yes Historical Provider, MD  eplerenone (INSPRA) 50 MG tablet Take 50 mg by mouth daily.   Yes Historical Provider, MD  fexofenadine (ALLEGRA) 180 MG tablet Take 180 mg by mouth every 14 (fourteen) days.    Yes Historical Provider, MD  insulin NPH-regular Human (NOVOLIN 70/30) (70-30) 100 UNIT/ML injection Inject 20 Units into the skin 2 (two) times daily with a meal. Take if Levimir is not approved 07/24/14  Yes Debbe Odea, MD  Insulin Syringe-Needle U-100 29G X  1/2" 0.3 ML MISC Dispense syringes and needles as needed- any brand is OK 07/24/14  Yes Debbe Odea, MD  senna (SENOKOT) 8.6 MG TABS tablet Take 2 tablets (17.2 mg total) by mouth at bedtime as needed for mild constipation. 07/23/14  Yes [name], MD  insulin aspart (NOVOLOG) 100 UNIT/ML injection Inject 0-15 Units into the skin 3 (three) times daily with meals. Patient not taking: Reported on 07/29/2014 07/24/14  Debbe Odea, MD  insulin aspart (NOVOLOG) 100 UNIT/ML injection Inject 0-5 Units into the skin at bedtime. Patient not taking: Reported on 07/29/2014 07/24/14   Debbe Odea, MD  insulin aspart (NOVOLOG) 100 UNIT/ML injection Inject 4 Units into the skin 3 (three) times daily with meals. Patient not taking: Reported on 07/29/2014 07/24/14   Debbe Odea, MD  insulin detemir (LEVEMIR) 100 UNIT/ML injection Inject 0.22 mLs (22 Units total) into the skin at bedtime. Patient not taking: Reported on 07/29/2014 07/14/14   Velvet Bathe, MD  insulin detemir (LEVEMIR) 100 UNIT/ML injection Inject 0.4 mLs (40 Units total) into the skin at bedtime. Patient not taking: Reported on 07/29/2014 07/24/14   Debbe Odea, MD    Allergies  Allergen Reactions  . Shellfish Allergy Hives and Swelling    Physical Exam  Vitals  Blood pressure 103/47, pulse 67, temperature 99.1 F (37.3 C), temperature source Oral, resp. rate 22, height 5\' 10"  (1.778 m), weight 256 lb (116.121 kg), SpO2 84 %.   General:  In no acute distress, appears healthy and well nourished  Psych:  Normal affect, Denies Suicidal or Homicidal ideations, Awake Alert, Oriented X 3. Speech and thought patterns are clear and appropriate, no apparent short term memory deficits  Neuro:   No focal neurological deficits, CN II through XII intact, Strength 5/5 all 4 extremities, Sensation intact all 4 extremities.  ENT:  Ears and Eyes appear Normal, Conjunctivae clear, PER. Moist oral mucosa without erythema or exudates.  Neck:  Supple,  No lymphadenopathy appreciated  Respiratory:  Symmetrical chest wall movement, Good air movement bilaterally, CTAB. Room Air  Cardiac:  RRR, No Murmurs, no LE edema noted, no JVD, No carotid bruits, peripheral pulses palpable at 2+  Abdomen:  Positive bowel sounds, Soft, Non tender, Non distended,  No masses appreciated, no obvious hepatosplenomegaly  Rectal: DRE not performed, Gen. external inspection revealed induration left gluteal fold and no obvious signs of drainage or blood and no foul odors  Skin:  No Cyanosis, Normal Skin Turgor, No Skin Rash or Bruise. Has large deep ulcerated scars central mid back measuring at least 20 cm in length and 3 cm in height  Extremities: Symmetrical without obvious trauma or injury,  no effusions.  Data Review  CBC  Recent Labs Lab 07/23/14 1123 07/29/14 0308  WBC 11.4* 16.5*  HGB 16.2 12.8*  HCT 46.8 39.4  PLT 216 200  MCV 87.3 89.7  MCH 30.2 29.2  MCHC 34.6 32.5  RDW 13.0 13.5  LYMPHSABS 2.6 2.1  MONOABS 1.3* 2.0*  EOSABS 0.1 0.0  BASOSABS 0.0 0.0    Chemistries   Recent Labs Lab 07/23/14 1123 07/23/14 1504 07/24/14 0502 07/29/14 0308  NA 132* 136 138 138  K 5.3* 4.4 3.8 5.1  CL 97 103 109 106  CO2 26 22 26 24   GLUCOSE 735* 548* 107* 134*  BUN 17 13 11 17   CREATININE 1.78* 1.55* 1.32 1.69*  CALCIUM 9.5 8.7 8.5 8.1*  AST 29 26  --   --   ALT 29 28  --   --   ALKPHOS 131* 123*  --   --   BILITOT 0.6 0.6  --   --     estimated creatinine clearance is 64.5 mL/min (by C-G formula based on Cr of 1.69).  No results for input(s): TSH, T4TOTAL, T3FREE, THYROIDAB in the last 72 hours.  Invalid input(s): FREET3  Coagulation profile No results for input(s): INR, PROTIME in the last 168 hours.  No results for input(s): DDIMER in the last 72 hours.  Cardiac Enzymes No results for input(s): CKMB, TROPONINI, MYOGLOBIN in the last 168 hours.  Invalid input(s): CK  Invalid input(s): POCBNP  Urinalysis    Component  Value Date/Time   COLORURINE YELLOW 07/24/2014 [place] 07/24/2014 1030   LABSPEC 1.014 07/24/2014 1030   PHURINE 5.5 07/24/2014 1030   GLUCOSEU 500* 07/24/2014 1030   HGBUR NEGATIVE 07/24/2014 1030   BILIRUBINUR NEGATIVE 07/24/2014 1030   KETONESUR NEGATIVE 07/24/2014 1030   PROTEINUR 100* 07/24/2014 1030   UROBILINOGEN 0.2 07/24/2014 1030   NITRITE NEGATIVE 07/24/2014 1030   LEUKOCYTESUR NEGATIVE 07/24/2014 1030    Imaging results:   Dg Chest 2 View  07/12/2014   CLINICAL DATA:  Hyperglycemia, dizziness, nausea.  EXAM: CHEST  2 VIEW  COMPARISON:  07/30/2013  FINDINGS: Left pacer remains in place, unchanged. Heart is upper limits normal in size. Lungs are clear. No effusions or pneumothorax. No acute bony abnormality.  IMPRESSION: No active cardiopulmonary disease.   Electronically Signed   By: Rolm Baptise M.D.   On: 07/12/2014 10:24   Ct Abdomen Pelvis W Contrast  07/29/2014   CLINICAL DATA:  Left buttock abscess.  EXAM: CT ABDOMEN AND PELVIS WITH CONTRAST  TECHNIQUE: Multidetector CT imaging of the abdomen and pelvis was performed using the standard protocol following bolus administration of intravenous contrast.  CONTRAST:  177mL OMNIPAQUE IOHEXOL 300 MG/ML  SOLN  COMPARISON:  CT 04/04/2009  FINDINGS: Minimal atelectasis in the dependent right lower lobe. Pacemaker wires are partially included, the heart is at the upper limits of normal in size.  There is a perirectal/perianal fluid collection extending to the anorectal junction posteriorly. This is irregular in shape measuring at least 5.5 x 5.4 x 5.5 cm. Small foci of air seen within the fluid collection with peripheral enhanced. There is associated skin thickening and soft tissue edema of the left buttock. No associated deep pelvic fluid collection.  Enlarged left obturator lymph node measures 1.6 cm in short axis dimension. There is a 1.2 cm left external iliac lymph node. Additional smaller bilateral inguinal and iliac  lymph nodes are seen.  Focal fatty infiltration adjacent to the falciform ligament, no focal hepatic lesion. The gallbladder is physiologically distended. The spleen, adrenal glands, and pancreas are normal. Kidneys demonstrate symmetric enhancement and excretion. No hydronephrosis or focal renal abnormality. There are no dilated or thickened bowel loops. The appendix is normal.  Abdominal aorta is normal in caliber. There is no retroperitoneal adenopathy. The urinary bladder is physiologically distended. The prostate gland is enlarged measuring 5.5 x 4.3 cm. No pelvic ascites.  Degenerative disc disease at L4-L5. Facet arthropathy in the lower lumbar spine. No acute osseous abnormality.  IMPRESSION: 1. Perianal/perirectal abscess posteriorly and left lateral. This measures approximately 5.5 cm. Prominent left obturator and iliac lymph nodes are likely reactive. 2. Enlarged prostate gland.   Electronically Signed   By: Jeb Levering M.D.   On: 07/29/2014 04:46   Dg Chest Portable 1 View  07/29/2014   CLINICAL DATA:  Sepsis.  Cough.  EXAM: PORTABLE CHEST - 1 VIEW  COMPARISON:  07/12/2014  FINDINGS: Dual lead left-sided pacemaker in place. The heart is enlarged. Pulmonary vasculature is normal for technique. There is no consolidation, pleural effusion, or pneumothorax. No acute osseous abnormalities are seen.  IMPRESSION: Cardiomegaly without localizing pulmonary process.   Electronically Signed   By: Jeb Levering M.D.   On: 07/29/2014 06:25  EKG:    Assessment & Plan  Principal Problem:   Abscess, gluteal, left -Does not meet SIRS criteria but because of soft blood pressures admit to stepdown unit -Appreciate surgery assistance; plan for intraoperative I/D later today -Continue empiric antibiotics therapy as directed by general surgery and follow-up on any intraoperative cultures -Wound care will be at discretion of surgical team -Provide appropriate IV analgesia -Routine HIV screening per  hospital protocol  Active Problems:   Chronic diastolic HF  -Currently compensated and stable on room air    CKD (chronic kidney disease), stage III -Creatinine slightly above baseline of 1.32 so we'll continue IV fluids for now    Fabry disease -Receives Fabrazyme every 14 days with last dose on 2/19    Hypertension -Blood pressure soft continue to hold home Eplerenone    COPD with asthma/suspected underlying sleep apnea -Currently stable without wheezing    Pacemaker-[employer]ude    Peripheral edema secondary to lymphedema -Stable and no worse than baseline    Iron deficiency anemia -previous admission anemia panel revealed iron level of 20 -Postoperative need to begin oral iron replacement    DVT Prophylaxis: SCDs preoperatively then subcutaneous heparin at discretion of surgical team  Family Communication:  No family at bedside   Code Status:  Full  Condition:  Stable  Time spent in minutes : 60   ELLIS,ALLISON L. ANP on 07/29/2014 at 8:44 AM  Between 7am to 7pm - Pager - [phone]  After 7pm go to [email or web address] - password [email or web address]  And look for the night coverage person covering me after hours  [facility]

## 2014-07-30 ENCOUNTER — Encounter (HOSPITAL_COMMUNITY): Payer: Self-pay | Admitting: General Surgery

## 2014-07-30 DIAGNOSIS — I10 Essential (primary) hypertension: Secondary | ICD-10-CM

## 2014-07-30 DIAGNOSIS — R609 Edema, unspecified: Secondary | ICD-10-CM

## 2014-07-30 DIAGNOSIS — E119 Type 2 diabetes mellitus without complications: Secondary | ICD-10-CM | POA: Diagnosis present

## 2014-07-30 DIAGNOSIS — N189 Chronic kidney disease, unspecified: Secondary | ICD-10-CM | POA: Diagnosis present

## 2014-07-30 DIAGNOSIS — IMO0001 Reserved for inherently not codable concepts without codable children: Secondary | ICD-10-CM | POA: Diagnosis present

## 2014-07-30 DIAGNOSIS — D509 Iron deficiency anemia, unspecified: Secondary | ICD-10-CM

## 2014-07-30 DIAGNOSIS — A419 Sepsis, unspecified organism: Principal | ICD-10-CM

## 2014-07-30 DIAGNOSIS — E1169 Type 2 diabetes mellitus with other specified complication: Secondary | ICD-10-CM | POA: Diagnosis present

## 2014-07-30 DIAGNOSIS — E669 Obesity, unspecified: Secondary | ICD-10-CM

## 2014-07-30 DIAGNOSIS — E7521 Fabry (-Anderson) disease: Secondary | ICD-10-CM

## 2014-07-30 DIAGNOSIS — K611 Rectal abscess: Secondary | ICD-10-CM

## 2014-07-30 DIAGNOSIS — I5032 Chronic diastolic (congestive) heart failure: Secondary | ICD-10-CM

## 2014-07-30 DIAGNOSIS — K604 Rectal fistula: Secondary | ICD-10-CM

## 2014-07-30 DIAGNOSIS — Z950 Presence of cardiac pacemaker: Secondary | ICD-10-CM

## 2014-07-30 DIAGNOSIS — L0231 Cutaneous abscess of buttock: Secondary | ICD-10-CM

## 2014-07-30 DIAGNOSIS — E1165 Type 2 diabetes mellitus with hyperglycemia: Secondary | ICD-10-CM

## 2014-07-30 DIAGNOSIS — N179 Acute kidney failure, unspecified: Secondary | ICD-10-CM

## 2014-07-30 LAB — PROTIME-INR
INR: 1.24 (ref 0.00–1.49)
Prothrombin Time: 15.7 seconds — ABNORMAL HIGH (ref 11.6–15.2)

## 2014-07-30 LAB — COMPREHENSIVE METABOLIC PANEL
ALT: 22 U/L (ref 0–53)
AST: 29 U/L (ref 0–37)
Albumin: 1.8 g/dL — ABNORMAL LOW (ref 3.5–5.2)
Alkaline Phosphatase: 121 U/L — ABNORMAL HIGH (ref 39–117)
Anion gap: 8 (ref 5–15)
BUN: 22 mg/dL (ref 6–23)
CO2: 23 mmol/L (ref 19–32)
Calcium: 8 mg/dL — ABNORMAL LOW (ref 8.4–10.5)
Chloride: 108 mmol/L (ref 96–112)
Creatinine, Ser: 1.95 mg/dL — ABNORMAL HIGH (ref 0.50–1.35)
GFR calc Af Amer: 43 mL/min — ABNORMAL LOW (ref >90)
GFR calc non Af Amer: 37 mL/min — ABNORMAL LOW (ref >90)
Glucose, Bld: 76 mg/dL (ref 70–99)
Potassium: 4.7 mmol/L (ref 3.5–5.1)
Sodium: 139 mmol/L (ref 135–145)
Total Bilirubin: 0.5 mg/dL (ref 0.3–1.2)
Total Protein: 6.2 g/dL (ref 6.0–8.3)

## 2014-07-30 LAB — HIV ANTIBODY (ROUTINE TESTING W REFLEX): HIV Screen 4th Generation wRfx: NONREACTIVE

## 2014-07-30 LAB — CBC
HCT: 35.7 % — ABNORMAL LOW (ref 39.0–52.0)
Hemoglobin: 11.4 g/dL — ABNORMAL LOW (ref 13.0–17.0)
MCH: 29.5 pg (ref 26.0–34.0)
MCHC: 31.9 g/dL (ref 30.0–36.0)
MCV: 92.2 fL (ref 78.0–100.0)
Platelets: 185 10*3/uL (ref 150–400)
RBC: 3.87 MIL/uL — ABNORMAL LOW (ref 4.22–5.81)
RDW: 13.9 % (ref 11.5–15.5)
WBC: 15.3 10*3/uL — ABNORMAL HIGH (ref 4.0–10.5)

## 2014-07-30 LAB — GLUCOSE, CAPILLARY
Glucose-Capillary: 141 mg/dL — ABNORMAL HIGH (ref 70–99)
Glucose-Capillary: 192 mg/dL — ABNORMAL HIGH (ref 70–99)
Glucose-Capillary: 212 mg/dL — ABNORMAL HIGH (ref 70–99)
Glucose-Capillary: 276 mg/dL — ABNORMAL HIGH (ref 70–99)
Glucose-Capillary: 70 mg/dL (ref 70–99)
Glucose-Capillary: 84 mg/dL (ref 70–99)
Glucose-Capillary: 87 mg/dL (ref 70–99)
Glucose-Capillary: 92 mg/dL (ref 70–99)

## 2014-07-30 LAB — APTT: aPTT: 38 seconds — ABNORMAL HIGH (ref 24–37)

## 2014-07-30 MED ORDER — VITAMIN C 500 MG PO TABS
500.0000 mg | ORAL_TABLET | Freq: Two times a day (BID) | ORAL | Status: DC
Start: 2014-07-30 — End: 2014-08-02
  Administered 2014-07-30 – 2014-08-02 (×6): 500 mg via ORAL
  Filled 2014-07-30 (×6): qty 1

## 2014-07-30 MED ORDER — OXYCODONE-ACETAMINOPHEN 5-325 MG PO TABS
1.0000 | ORAL_TABLET | ORAL | Status: DC | PRN
  Administered 2014-07-30: 2 via ORAL
  Filled 2014-07-30: qty 2

## 2014-07-30 MED ORDER — FERROUS SULFATE 325 (65 FE) MG PO TABS
325.0000 mg | ORAL_TABLET | Freq: Three times a day (TID) | ORAL | Status: DC
Start: 2014-07-31 — End: 2014-08-02
  Administered 2014-07-31 – 2014-08-02 (×9): 325 mg via ORAL
  Filled 2014-07-30 (×8): qty 1

## 2014-07-30 NOTE — Progress Notes (Signed)
[name], male   DOB: 16-Nov-1960, 54 y.o.   MRN: [id number]     [place]      [address] [place], [place] [address]    Phone: [phone] FAX: [phone]     Subjective: Pain is much better. WBC trending down.  Afebrile.  BP soft but stable. Passing flatus.  Hungry.    Objective:  Vital signs:  Filed Vitals:   07/30/14 0000 07/30/14 0001 07/30/14 0338 07/30/14 0500  BP: 99/66  90/58   Pulse: 72  71   Temp:  98.4 F (36.9 C) 98.1 F (36.7 C)   TempSrc:  Oral Oral   Resp: 9  15   Height:      Weight:    263 lb 7.2 oz (119.5 kg)  SpO2: 98%  97%        Intake/Output   Yesterday:  02/22 0701 - 02/23 0700 In: 1812.5 [P.O.:450; I.V.:600; IV Piggyback:762.5] Out: 450 [Urine:450] This shift:    I/O last 3 completed shifts: In: 1812.5 [P.O.:450; I.V.:600; IV Piggyback:762.5] Out: 450 [Urine:450]     Physical Exam: General: Pt awake/alert/oriented x4 in no acute distress Abdomen: Soft.  Nondistended.  Non tender. No evidence of peritonitis.  No incarcerated hernias. Skin: dressing c/d/i   Problem List:   Principal Problem:   Abscess, gluteal, left Active Problems:   Fabry disease   Hypertension   COPD with asthma   Pacemaker-St.Jude   Peripheral edema   Chronic diastolic HF (heart failure)   CKD (chronic kidney disease), stage III   Iron deficiency anemia   Perirectal abscess    Results:   Labs: Results for orders placed or performed during the hospital encounter of 07/29/14 (from the past 48 hour(s))  CBG monitoring, ED     Status: Abnormal   Collection Time: 07/29/14  3:01 AM  Result Value Ref Range   Glucose-Capillary 121 (H) 70 - 99 mg/dL  Basic metabolic panel     Status: Abnormal   Collection Time: 07/29/14  3:08 AM  Result Value Ref Range   Sodium 138 135 - 145 mmol/L   Potassium 5.1 3.5 - 5.1 mmol/L   Chloride 106 96 - 112 mmol/L   CO2 24 19 - 32 mmol/L   Glucose,  Bld 134 (H) 70 - 99 mg/dL   BUN 17 6 - 23 mg/dL   Creatinine, Ser 1.69 (H) 0.50 - 1.35 mg/dL   Calcium 8.1 (L) 8.4 - 10.5 mg/dL   GFR calc non Af Amer 45 (L) >90 mL/min   GFR calc Af Amer 52 (L) >90 mL/min    Comment: (NOTE) The eGFR has been calculated using the CKD EPI equation. This calculation has not been validated in all clinical situations. eGFR's persistently <90 mL/min signify possible Chronic Kidney Disease.    Anion gap 8 5 - 15  CBC with Differential/Platelet     Status: Abnormal   Collection Time: 07/29/14  3:08 AM  Result Value Ref Range   WBC 16.5 (H) 4.0 - 10.5 K/uL   RBC 4.39 4.22 - 5.81 MIL/uL   Hemoglobin 12.8 (L) 13.0 - 17.0 g/dL   HCT 39.4 39.0 - 52.0 %   MCV 89.7 78.0 - 100.0 fL   MCH 29.2 26.0 - 34.0 pg   MCHC 32.5 30.0 - 36.0 g/dL   RDW 13.5 11.5 - 15.5 %   Platelets 200 150 - 400 K/uL   Neutrophils Relative % 75 43 -  77 %   Lymphocytes Relative 13 12 - 46 %   Monocytes Relative 12 3 - 12 %   Eosinophils Relative 0 0 - 5 %   Basophils Relative 0 0 - 1 %   Neutro Abs 12.4 (H) 1.7 - 7.7 K/uL   Lymphs Abs 2.1 0.7 - 4.0 K/uL   Monocytes Absolute 2.0 (H) 0.1 - 1.0 K/uL   Eosinophils Absolute 0.0 0.0 - 0.7 K/uL   Basophils Absolute 0.0 0.0 - 0.1 K/uL   RBC Morphology POLYCHROMASIA PRESENT    WBC Morphology TOXIC GRANULATION     Comment: DOHLE BODIES  I-Stat CG4 Lactic Acid, ED (not at [facility])     Status: None   Collection Time: 07/29/14  6:05 AM  Result Value Ref Range   Lactic Acid, Venous 1.26 0.5 - 2.0 mmol/L  Glucose, capillary     Status: None   Collection Time: 07/29/14  8:21 AM  Result Value Ref Range   Glucose-Capillary 87 70 - 99 mg/dL  I-Stat CG4 Lactic Acid, ED (not at [facility])     Status: None   Collection Time: 07/29/14  8:27 AM  Result Value Ref Range   Lactic Acid, Venous 1.34 0.5 - 2.0 mmol/L  Glucose, capillary     Status: None   Collection Time: 07/29/14 10:56 AM  Result Value Ref Range   Glucose-Capillary 77 70 - 99 mg/dL   Comment 1  Notify RN    Comment 2 Documented in Char   Glucose, capillary     Status: None   Collection Time: 07/29/14 11:25 AM  Result Value Ref Range   Glucose-Capillary 90 70 - 99 mg/dL   Comment 1 Notify RN    Comment 2 Documented in Char   MRSA PCR Screening     Status: None   Collection Time: 07/29/14 12:06 PM  Result Value Ref Range   MRSA by PCR NEGATIVE NEGATIVE    Comment:        The GeneXpert MRSA Assay (FDA approved for NASAL specimens only), is one component of a comprehensive MRSA colonization surveillance program. It is not intended to diagnose MRSA infection nor to guide or monitor treatment for MRSA infections.   Glucose, capillary     Status: Abnormal   Collection Time: 07/29/14  4:09 PM  Result Value Ref Range   Glucose-Capillary 238 (H) 70 - 99 mg/dL   Comment 1 Notify RN   Glucose, capillary     Status: Abnormal   Collection Time: 07/29/14  7:52 PM  Result Value Ref Range   Glucose-Capillary 276 (H) 70 - 99 mg/dL   Comment 1 Capillary Specimen   Glucose, capillary     Status: Abnormal   Collection Time: 07/29/14 11:55 PM  Result Value Ref Range   Glucose-Capillary 141 (H) 70 - 99 mg/dL  Comprehensive metabolic panel     Status: Abnormal   Collection Time: 07/30/14  3:35 AM  Result Value Ref Range   Sodium 139 135 - 145 mmol/L   Potassium 4.7 3.5 - 5.1 mmol/L   Chloride 108 96 - 112 mmol/L   CO2 23 19 - 32 mmol/L   Glucose, Bld 76 70 - 99 mg/dL   BUN 22 6 - 23 mg/dL   Creatinine, Ser 1.95 (H) 0.50 - 1.35 mg/dL   Calcium 8.0 (L) 8.4 - 10.5 mg/dL   Total Protein 6.2 6.0 - 8.3 g/dL   Albumin 1.8 (L) 3.5 - 5.2 g/dL   AST 29 0 - 37  U/L   ALT 22 0 - 53 U/L   Alkaline Phosphatase 121 (H) 39 - 117 U/L   Total Bilirubin 0.5 0.3 - 1.2 mg/dL   GFR calc non Af Amer 37 (L) >90 mL/min   GFR calc Af Amer 43 (L) >90 mL/min    Comment: (NOTE) The eGFR has been calculated using the CKD EPI equation. This calculation has not been validated in all clinical  situations. eGFR's persistently <90 mL/min signify possible Chronic Kidney Disease.    Anion gap 8 5 - 15  CBC     Status: Abnormal   Collection Time: 07/30/14  3:35 AM  Result Value Ref Range   WBC 15.3 (H) 4.0 - 10.5 K/uL   RBC 3.87 (L) 4.22 - 5.81 MIL/uL   Hemoglobin 11.4 (L) 13.0 - 17.0 g/dL   HCT 35.7 (L) 39.0 - 52.0 %   MCV 92.2 78.0 - 100.0 fL   MCH 29.5 26.0 - 34.0 pg   MCHC 31.9 30.0 - 36.0 g/dL   RDW 13.9 11.5 - 15.5 %   Platelets 185 150 - 400 K/uL  Protime-INR     Status: Abnormal   Collection Time: 07/30/14  3:35 AM  Result Value Ref Range   Prothrombin Time 15.7 (H) 11.6 - 15.2 seconds   INR 1.24 0.00 - 1.49  APTT     Status: Abnormal   Collection Time: 07/30/14  3:35 AM  Result Value Ref Range   aPTT 38 (H) 24 - 37 seconds    Comment:        IF BASELINE aPTT IS ELEVATED, SUGGEST PATIENT RISK ASSESSMENT BE USED TO DETERMINE APPROPRIATE ANTICOAGULANT THERAPY.   Glucose, capillary     Status: None   Collection Time: 07/30/14  3:35 AM  Result Value Ref Range   Glucose-Capillary 84 70 - 99 mg/dL   Comment 1 Venous Specimen     Imaging / Studies: Ct Abdomen Pelvis W Contrast  07/29/2014   CLINICAL DATA:  Left buttock abscess.  EXAM: CT ABDOMEN AND PELVIS WITH CONTRAST  TECHNIQUE: Multidetector CT imaging of the abdomen and pelvis was performed using the standard protocol following bolus administration of intravenous contrast.  CONTRAST:  175mL OMNIPAQUE IOHEXOL 300 MG/ML  SOLN  COMPARISON:  CT 04/04/2009  FINDINGS: Minimal atelectasis in the dependent right lower lobe. Pacemaker wires are partially included, the heart is at the upper limits of normal in size.  There is a perirectal/perianal fluid collection extending to the anorectal junction posteriorly. This is irregular in shape measuring at least 5.5 x 5.4 x 5.5 cm. Small foci of air seen within the fluid collection with peripheral enhanced. There is associated skin thickening and soft tissue edema of the left  buttock. No associated deep pelvic fluid collection.  Enlarged left obturator lymph node measures 1.6 cm in short axis dimension. There is a 1.2 cm left external iliac lymph node. Additional smaller bilateral inguinal and iliac lymph nodes are seen.  Focal fatty infiltration adjacent to the falciform ligament, no focal hepatic lesion. The gallbladder is physiologically distended. The spleen, adrenal glands, and pancreas are normal. Kidneys demonstrate symmetric enhancement and excretion. No hydronephrosis or focal renal abnormality. There are no dilated or thickened bowel loops. The appendix is normal.  Abdominal aorta is normal in caliber. There is no retroperitoneal adenopathy. The urinary bladder is physiologically distended. The prostate gland is enlarged measuring 5.5 x 4.3 cm. No pelvic ascites.  Degenerative disc disease at L4-L5. Facet arthropathy in the lower lumbar  spine. No acute osseous abnormality.  IMPRESSION: 1. Perianal/perirectal abscess posteriorly and left lateral. This measures approximately 5.5 cm. Prominent left obturator and iliac lymph nodes are likely reactive. 2. Enlarged prostate gland.   Electronically Signed   By: Jeb Levering M.D.   On: 07/29/2014 04:46   Dg Chest Portable 1 View  07/29/2014   CLINICAL DATA:  Sepsis.  Cough.  EXAM: PORTABLE CHEST - 1 VIEW  COMPARISON:  07/12/2014  FINDINGS: Dual lead left-sided pacemaker in place. The heart is enlarged. Pulmonary vasculature is normal for technique. There is no consolidation, pleural effusion, or pneumothorax. No acute osseous abnormalities are seen.  IMPRESSION: Cardiomegaly without localizing pulmonary process.   Electronically Signed   By: Jeb Levering M.D.   On: 07/29/2014 06:25    Medications / Allergies:  Scheduled Meds: . aspirin  81 mg Oral Daily  . insulin aspart  0-15 Units Subcutaneous 6 times per day  . insulin aspart protamine- aspart  20 Units Subcutaneous BID WC  . piperacillin-tazobactam (ZOSYN)  IV   3.375 g Intravenous 3 times per day  . vancomycin  750 mg Intravenous Q12H   Continuous Infusions: . sodium chloride     PRN Meds:.acetaminophen **OR** acetaminophen (TYLENOL) oral liquid 160 mg/5 mL, albuterol, HYDROmorphone (DILAUDID) injection, HYDROmorphone (DILAUDID) injection, ondansetron **OR** ondansetron (ZOFRAN) IV, oxyCODONE-acetaminophen  Antibiotics: Anti-infectives    Start     Dose/Rate Route Frequency Ordered Stop   07/30/14 0400  vancomycin (VANCOCIN) IVPB 750 mg/150 ml premix     750 mg 150 mL/hr over 60 Minutes Intravenous Every 12 hours 07/29/14 1553     07/29/14 1600  piperacillin-tazobactam (ZOSYN) IVPB 3.375 g     3.375 g 12.5 mL/hr over 240 Minutes Intravenous 3 times per day 07/29/14 1550     07/29/14 1600  vancomycin (VANCOCIN) 2,000 mg in sodium chloride 0.9 % 500 mL IVPB     2,000 mg 250 mL/hr over 120 Minutes Intravenous  Once 07/29/14 1553 07/29/14 1929   07/29/14 0500  piperacillin-tazobactam (ZOSYN) IVPB 3.375 g     3.375 g 100 mL/hr over 30 Minutes Intravenous  Once 07/29/14 0458 07/29/14 0703   07/29/14 0245  vancomycin (VANCOCIN) IVPB 1000 mg/200 mL premix     1,000 mg 200 mL/hr over 60 Minutes Intravenous  Once 07/29/14 0234 07/29/14 0602        Assessment/Plan POD#1 I&D perirectal abscess -will do dressing change at 10am, pre-medicate with PO pain meds -mobilize -Vanc/zosyn -follow cultures -advance to carb modified diet -add percocet    Erby Pian, ANP-BC [facility] Pager [phone](7A-4:30P) For consults and floor pages call [phone](7A-4:30P)  07/30/2014 7:30 AM

## 2014-07-30 NOTE — Progress Notes (Signed)
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 Discharged 2/17 after being admitted for severe hypoglycemia precipitated by inability to purchase Levemir he was previously prescribed. He returns to the ER now with 3 days of perirectal pain radiating to the left gluteal area with firmness; this was associated with swelling and pain with wiping post bowel movement. He denied fevers or chills or any drainage.  In the ER he was afebrile but his blood pressures were somewhat soft with systolic readings in the 0000000 that improved after IV fluid bolus. White count was 16,500. Exam was concerning for perirectal abscess. CT the abdomen and pelvis was obtained demonstrated a 5 cm fluid collection in the perirectal. He anal region extended to the anal renal junction there was an associated skin thickening and soft tissue edema of the left buttock. There was no deep pelvic fluid collection. General surgery was consulted by the EDP and requested internal medicine admit the patient. Answer to pursue surgical intervention today.  HPI/Subjective: 2/23 A/O 4, NAD  Assessment/Plan: Left Large perirectal abscess with likely fistula  -Does not meet SIRS criteria but because of soft blood pressures admit to stepdown unit -2/23 S/P I&D of abscess -Continue empiric antibiotics; await cultures and sensitivities  -Wound care will be at discretion of surgical team -Provide appropriate IV analgesia   Chronic diastolic HF/LVH  -Currently compensated and stable on room air  Pacemaker-St.Jude  Pripheral edema secondary to lymphedema -Stable and no worse than baseline  Hypertension -Blood pressure  soft continue to hold home Eplerenone  Acute on chronic kidney failure (Bseline of 1.32 ) - Continue normal saline  15ml/hr   Fabry disease -Receives Fabrazyme every 14 days with last dose on 2/19   COPD with asthma/suspected underlying sleep apnea -Currently stable without wheezing  Iron deficiency anemia -previous admission anemia panel revealed iron level of 20 -Iron 325 mg TID -Vitamin C 500 mg BID  Diabetes type 2 uncontrolled -12/6 Hemoglobin A1c= 12.3 -NovoLog 70/30  20 units BID -Continue moderate SSI    Code Status: FULL Family Communication: no family present at time of exam Disposition Plan: Resolution abscess    Consultants: Dr.Jay Wyatt (surgery)  Procedure/Significant Events: 2/22 echocardiogram;- Left ventricle:  moderate LVH. Appearance myocardium is concerning for  infiltrative disease. -LVEF=60% to65%.  -(grade 1diastolic dysfunction). 2/23IRRIGATION AND DEBRIDEMENT PERIRECTAL ABSCESS   Culture 2/23 abscess pending 2/22 blood pending 2/22 HIV negative   Antibiotics: Zosyn 2/22>> Vancomycin 2/23>>   DVT prophylaxis: SCD   Devices NA   LINES / TUBES:  NA    Continuous Infusions: . sodium chloride      Objective: VITAL SIGNS: Temp: 98 F (36.7 C) (02/23 1644) Temp Source: Oral (02/23 1644) BP: 109/61 mmHg (02/23 1644) Pulse Rate: 72 (02/23 1644) SPO2; FIO2:   Intake/Output Summary (Last 24 hours) at 07/30/14 1647 Last data filed at 07/30/14 1500  Gross per 24 hour  Intake 1497.5 ml  Output    650 ml  Net  847.5 ml     Exam: General: A/O x4, NAD Lt Buttock covered and clean no pus expressed fm incision  No acute respiratory distress Lungs: Clear to auscultation bilaterally without wheezes or crackles Cardiovascular: Regular rate and rhythm without murmur gallop  or rub normal S1 and S2 Abdomen: Nontender, nondistended, soft, bowel sounds positive, no rebound, no ascites, no appreciable mass Extremities: No  significant cyanosis, clubbing, or edema bilateral lower extremities  Data Reviewed: Basic Metabolic Panel:  Recent Labs Lab 07/24/14 0502 07/26/14 0950 07/29/14 0308 07/30/14 0335  NA 138  --  138 139  K 3.8  --  5.1 4.7  CL 109  --  106 108  CO2 26  --  24 23  GLUCOSE 107*  --  134* 76  BUN 11  --  17 22  CREATININE 1.32  --  1.69* 1.95*  CALCIUM 8.5  --  8.1* 8.0*  PHOS  --  3.7  --   --    Liver Function Tests:  Recent Labs Lab 07/30/14 0335  AST 29  ALT 22  ALKPHOS 121*  BILITOT 0.5  PROT 6.2  ALBUMIN 1.8*   No results for input(s): LIPASE, AMYLASE in the last 168 hours. No results for input(s): AMMONIA in the last 168 hours. CBC:  Recent Labs Lab 07/29/14 0308 07/30/14 0335  WBC 16.5* 15.3*  NEUTROABS 12.4*  --   HGB 12.8* 11.4*  HCT 39.4 35.7*  MCV 89.7 92.2  PLT 200 185   Cardiac Enzymes: No results for input(s): CKTOTAL, CKMB, CKMBINDEX, TROPONINI in the last 168 hours. BNP (last 3 results) No results for input(s): BNP in the last 8760 hours.  ProBNP (last 3 results) No results for input(s): PROBNP in the last 8760 hours.  CBG:  Recent Labs Lab 07/29/14 1952 07/29/14 2355 07/30/14 0335 07/30/14 0820 07/30/14 1150  GLUCAP 276* 141* 84 87 192*    Recent Results (from the past 240 hour(s))  Blood Culture (routine x 2)     Status: None (Preliminary result)   Collection Time: 07/29/14  5:40 AM  Result Value Ref Range Status   Specimen Description BLOOD RIGHT HAND  Final   Special Requests BOTTLES DRAWN AEROBIC AND ANAEROBIC 10CC  Final   Culture   Final           BLOOD CULTURE RECEIVED NO GROWTH TO DATE CULTURE WILL BE HELD FOR 5 DAYS BEFORE ISSUING A FINAL NEGATIVE REPORT Note: Culture results may be compromised due to an excessive volume of blood received in culture bottles. Performed at [facility]    Report Status PENDING  Incomplete  Blood Culture (routine x 2)     Status: None (Preliminary result)   Collection Time:  07/29/14  6:00 AM  Result Value Ref Range Status   Specimen Description BLOOD LEFT HAND  Final   Special Requests BOTTLES DRAWN AEROBIC ONLY 10CC  Final   Culture   Final           BLOOD CULTURE RECEIVED NO GROWTH TO DATE CULTURE WILL BE HELD FOR 5 DAYS BEFORE ISSUING A FINAL NEGATIVE REPORT Performed at [facility]    Report Status PENDING  Incomplete  Anaerobic culture     Status: None (Preliminary result)   Collection Time: 07/29/14 10:15 AM  Result Value Ref Range Status   Specimen Description ABSCESS BUTTOCKS LEFT  Final   Special Requests PT ON VANCOMYCIN AND ZOSYN  Final   Gram Stain PENDING  Incomplete   Culture   Final    NO ANAEROBES ISOLATED; CULTURE IN PROGRESS FOR 5 DAYS Performed at [facility]    Report Status PENDING  Incomplete  Culture, routine-abscess     Status: None (Preliminary result)   Collection  Time: 07/29/14 10:15 AM  Result Value Ref Range Status   Specimen Description ABSCESS BUTTOCKS LEFT  Final   Special Requests PT ON VANCOMYCIN AND ZOSYN  Final   Gram Stain   Final    ABUNDANT WBC PRESENT,BOTH PMN AND MONONUCLEAR NO SQUAMOUS EPITHELIAL CELLS SEEN MODERATE GRAM VARIABLE ROD RARE GRAM POSITIVE COCCI IN PAIRS    Culture   Final    Culture reincubated for better growth Performed at [facility]    Report Status PENDING  Incomplete  MRSA PCR Screening     Status: None   Collection Time: 07/29/14 12:06 PM  Result Value Ref Range Status   MRSA by PCR NEGATIVE NEGATIVE Final    Comment:        The GeneXpert MRSA Assay (FDA approved for NASAL specimens only), is one component of a comprehensive MRSA colonization surveillance program. It is not intended to diagnose MRSA infection nor to guide or monitor treatment for MRSA infections.      Studies:  Recent x-ray studies have been reviewed in detail by the Attending Physician  Scheduled Meds:  Scheduled Meds: . aspirin  81 mg Oral Daily  . insulin aspart   0-15 Units Subcutaneous 6 times per day  . insulin aspart protamine- aspart  20 Units Subcutaneous BID WC  . piperacillin-tazobactam (ZOSYN)  IV  3.375 g Intravenous 3 times per day  . vancomycin  750 mg Intravenous Q12H    Time spent on care of this patient: 40 mins   Allie Bossier [facility]  [facility] Office  [phone] Pager - [phone]  On-Call/Text Page:      [email or web address]      password [email or web address]  If 7PM-7AM, please contact night-coverage [email or web address] Password [facility] 07/30/2014, 4:47 PM   LOS: 1 day   Care during the described time interval was provided by me .  I have reviewed this patient's available data, including medical history, events of note, physical examination, radiology studies and test results as part of my evaluation  Dia Crawford, MD [phone] Pager

## 2014-07-30 NOTE — Progress Notes (Signed)
Note: Looked at patient due to A1c level 12.3% on 07/13/14. Pt was seen on by a Diabetes Coordinator on  2/19 while receiving infusion of Fabrazyme. At that time Diabetes Coordinator answered all of the patients questions. Pt also has an appointment at the [facility] on March 17th for further education. Patients glucose levels were 121 mg/dl on admission and is doing well on current medication regimen. Will watch while inpatient.  Thanks,  Tama Headings RN, MSN, [facility] Inpatient Diabetes Coordinator Team Pager [phone]

## 2014-07-30 NOTE — Progress Notes (Signed)
[name], male   DOB: [birth year], 54 y.o.   MRN: [id number]  Wound is adequately draining, no surrounding induration of erythema.  Will start BID wet to dry dressing changes. Will need home health at DC Follow cultures Pre-medicate before dressing changes.  Hopefully we can transition him from IV to PO with dressing changes.  Chaynce Schafer, ANP-BC

## 2014-07-30 NOTE — Clinical Documentation Improvement (Signed)
Supporting Information: Sepsis due to unspecified organism per 02/22 progress notes.    Possible Clinical Condition? . Bacteremia (positive blood cultures only) . Sepsis with UTI, versus UTI only . Sepsis-specify causative organism if known . Sepsis due to: --Device --Implant --Graft . Severe sepsis-sepsis with organ dysfunction --Specify organ dysfunction Respiratory failure Encephalopathy Acute kidney failure Other (specify) . SIRS (Systemic Inflammatory Response Syndrome --With or without organ dysfunction . Document septic shock if present . Document any associated diagnoses/conditions     Thank Sherian Maroon Documentation Specialist [phone] [email or web address]

## 2014-07-31 ENCOUNTER — Encounter (HOSPITAL_COMMUNITY): Payer: Self-pay | Admitting: *Deleted

## 2014-07-31 LAB — GLUCOSE, CAPILLARY
Glucose-Capillary: 57 mg/dL — ABNORMAL LOW (ref 70–99)
Glucose-Capillary: 100 mg/dL — ABNORMAL HIGH (ref 70–99)
Glucose-Capillary: 53 mg/dL — ABNORMAL LOW (ref 70–99)
Glucose-Capillary: 138 mg/dL — ABNORMAL HIGH (ref 70–99)
Glucose-Capillary: 106 mg/dL — ABNORMAL HIGH (ref 70–99)
Glucose-Capillary: 116 mg/dL — ABNORMAL HIGH (ref 70–99)
Glucose-Capillary: 72 mg/dL (ref 70–99)

## 2014-07-31 LAB — CLOSTRIDIUM DIFFICILE BY PCR: Toxigenic C. Difficile by PCR: NEGATIVE

## 2014-07-31 MED ORDER — INSULIN ASPART 100 UNIT/ML ~~LOC~~ SOLN
0.0000 [IU] | Freq: Three times a day (TID) | SUBCUTANEOUS | Status: DC
  Administered 2014-08-01: 5 [IU] via SUBCUTANEOUS
  Administered 2014-08-01: 3 [IU] via SUBCUTANEOUS
  Administered 2014-08-02 (×2): 2 [IU] via SUBCUTANEOUS

## 2014-07-31 MED ORDER — SODIUM CHLORIDE 0.9 % IV SOLN
INTRAVENOUS | Status: DC
  Administered 2014-07-31 – 2014-08-02 (×4): via INTRAVENOUS

## 2014-07-31 NOTE — Progress Notes (Signed)
Inpatient Diabetes Program Recommendations  AACE/ADA: New Consensus Statement on Inpatient Glycemic Control (2013)  Target Ranges:  Prepandial:   less than 140 mg/dL      Peak postprandial:   less than 180 mg/dL (1-2 hours)      Critically ill patients:  140 - 180 mg/dL   Please change Novolog to TID instead of Q4 now that pt is eating.  Thank you  Raoul Pitch BSN, RN,CDE Inpatient Diabetes Coordinator [phone] (team pager)

## 2014-07-31 NOTE — Progress Notes (Signed)
Pt continues to be hypoglycemic. cbg 53.  Pt given graham crackers and peanut butter. Will continue to monitor.

## 2014-07-31 NOTE — Progress Notes (Signed)
[name], male   DOB: 16-Dec-1960, 54 y.o.   MRN: [id number]     [facility]      [address] [place], [place] [address]    Phone: [phone] FAX: [phone]     Subjective: Better pain.  Afebrile.  VSS.  BS low.  Tolerating PO.    Objective:  Vital signs:  Filed Vitals:   07/30/14 2000 07/30/14 2327 07/31/14 0336 07/31/14 0400  BP: 108/77 110/62 123/82 118/63  Pulse: 77 69 75 77  Temp:  98.3 F (36.8 C) 98 F (36.7 C)   TempSrc:  Oral Oral   Resp: $Remo'17 27 15 20  'EdhQt$ Height:      Weight:   269 lb 13.5 oz (122.4 kg)   SpO2: 94% 95% 95% 95%       Intake/Output   Yesterday:  02/23 0701 - 02/24 0700 In: 597.5 [P.O.:360; IV Piggyback:237.5] Out: 1950 [Urine:1950] This shift:  Total I/O In: 12.5 [IV Piggyback:12.5] Out: 300 [Urine:300]   Physical Exam: General: Pt awake/alert/oriented x4 in no acute distress Skin: left perirectal abscess- wound with purulent drainage.  There is an abscess inferior aspect of the wound towards the scrotum, mildly tender, fluctuant   Problem List:   Principal Problem:   Abscess, gluteal, left Active Problems:   Fabry disease   Hypertension   COPD with asthma   Pacemaker-St.Jude   Peripheral edema   Chronic diastolic HF (heart failure)   CKD (chronic kidney disease), stage III   Iron deficiency anemia   Perirectal abscess   Acute on chronic renal failure   Perirectal fistula   Blood poisoning   Diabetes type 2, uncontrolled    Results:   Labs: Results for orders placed or performed during the hospital encounter of 07/29/14 (from the past 48 hour(s))  I-Stat CG4 Lactic Acid, ED (not at [facility])     Status: None   Collection Time: 07/29/14  8:27 AM  Result Value Ref Range   Lactic Acid, Venous 1.34 0.5 - 2.0 mmol/L  Anaerobic culture     Status: None (Preliminary result)   Collection Time: 07/29/14 10:15 AM  Result Value Ref Range   Specimen Description  ABSCESS BUTTOCKS LEFT    Special Requests PT ON VANCOMYCIN AND ZOSYN    Gram Stain PENDING    Culture      NO ANAEROBES ISOLATED; CULTURE IN PROGRESS FOR 5 DAYS Performed at [facility]    Report Status PENDING   Culture, routine-abscess     Status: None (Preliminary result)   Collection Time: 07/29/14 10:15 AM  Result Value Ref Range   Specimen Description ABSCESS BUTTOCKS LEFT    Special Requests PT ON VANCOMYCIN AND ZOSYN    Gram Stain      ABUNDANT WBC PRESENT,BOTH PMN AND MONONUCLEAR NO SQUAMOUS EPITHELIAL CELLS SEEN MODERATE GRAM VARIABLE ROD RARE GRAM POSITIVE COCCI IN PAIRS    Culture      MULTIPLE ORGANISMS PRESENT, NONE PREDOMINANT Performed at [facility]    Report Status PENDING   Glucose, capillary     Status: None   Collection Time: 07/29/14 10:56 AM  Result Value Ref Range   Glucose-Capillary 77 70 - 99 mg/dL   Comment 1 Notify RN    Comment 2 Documented in Char   Glucose, capillary     Status: None   Collection Time: 07/29/14 11:25 AM  Result Value Ref Range   Glucose-Capillary 90 70 -  99 mg/dL   Comment 1 Notify RN    Comment 2 Documented in Char   MRSA PCR Screening     Status: None   Collection Time: 07/29/14 12:06 PM  Result Value Ref Range   MRSA by PCR NEGATIVE NEGATIVE    Comment:        The GeneXpert MRSA Assay (FDA approved for NASAL specimens only), is one component of a comprehensive MRSA colonization surveillance program. It is not intended to diagnose MRSA infection nor to guide or monitor treatment for MRSA infections.   Glucose, capillary     Status: Abnormal   Collection Time: 07/29/14  4:09 PM  Result Value Ref Range   Glucose-Capillary 238 (H) 70 - 99 mg/dL   Comment 1 Notify RN   Glucose, capillary     Status: Abnormal   Collection Time: 07/29/14  7:52 PM  Result Value Ref Range   Glucose-Capillary 276 (H) 70 - 99 mg/dL   Comment 1 Capillary Specimen   Glucose, capillary     Status: Abnormal    Collection Time: 07/29/14 11:55 PM  Result Value Ref Range   Glucose-Capillary 141 (H) 70 - 99 mg/dL  Comprehensive metabolic panel     Status: Abnormal   Collection Time: 07/30/14  3:35 AM  Result Value Ref Range   Sodium 139 135 - 145 mmol/L   Potassium 4.7 3.5 - 5.1 mmol/L   Chloride 108 96 - 112 mmol/L   CO2 23 19 - 32 mmol/L   Glucose, Bld 76 70 - 99 mg/dL   BUN 22 6 - 23 mg/dL   Creatinine, Ser 1.95 (H) 0.50 - 1.35 mg/dL   Calcium 8.0 (L) 8.4 - 10.5 mg/dL   Total Protein 6.2 6.0 - 8.3 g/dL   Albumin 1.8 (L) 3.5 - 5.2 g/dL   AST 29 0 - 37 U/L   ALT 22 0 - 53 U/L   Alkaline Phosphatase 121 (H) 39 - 117 U/L   Total Bilirubin 0.5 0.3 - 1.2 mg/dL   GFR calc non Af Amer 37 (L) >90 mL/min   GFR calc Af Amer 43 (L) >90 mL/min    Comment: (NOTE) The eGFR has been calculated using the CKD EPI equation. This calculation has not been validated in all clinical situations. eGFR's persistently <90 mL/min signify possible Chronic Kidney Disease.    Anion gap 8 5 - 15  CBC     Status: Abnormal   Collection Time: 07/30/14  3:35 AM  Result Value Ref Range   WBC 15.3 (H) 4.0 - 10.5 K/uL   RBC 3.87 (L) 4.22 - 5.81 MIL/uL   Hemoglobin 11.4 (L) 13.0 - 17.0 g/dL   HCT 35.7 (L) 39.0 - 52.0 %   MCV 92.2 78.0 - 100.0 fL   MCH 29.5 26.0 - 34.0 pg   MCHC 31.9 30.0 - 36.0 g/dL   RDW 13.9 11.5 - 15.5 %   Platelets 185 150 - 400 K/uL  Protime-INR     Status: Abnormal   Collection Time: 07/30/14  3:35 AM  Result Value Ref Range   Prothrombin Time 15.7 (H) 11.6 - 15.2 seconds   INR 1.24 0.00 - 1.49  APTT     Status: Abnormal   Collection Time: 07/30/14  3:35 AM  Result Value Ref Range   aPTT 38 (H) 24 - 37 seconds    Comment:        IF BASELINE aPTT IS ELEVATED, SUGGEST PATIENT RISK ASSESSMENT BE USED TO DETERMINE APPROPRIATE  ANTICOAGULANT THERAPY.   Glucose, capillary     Status: None   Collection Time: 07/30/14  3:35 AM  Result Value Ref Range   Glucose-Capillary 84 70 - 99 mg/dL    Comment 1 Venous Specimen   Glucose, capillary     Status: None   Collection Time: 07/30/14  8:20 AM  Result Value Ref Range   Glucose-Capillary 87 70 - 99 mg/dL   Comment 1 Notify RN   Glucose, capillary     Status: Abnormal   Collection Time: 07/30/14 11:50 AM  Result Value Ref Range   Glucose-Capillary 192 (H) 70 - 99 mg/dL   Comment 1 Notify RN   Glucose, capillary     Status: Abnormal   Collection Time: 07/30/14  4:42 PM  Result Value Ref Range   Glucose-Capillary 212 (H) 70 - 99 mg/dL   Comment 1 Notify RN   Glucose, capillary     Status: None   Collection Time: 07/30/14  7:48 PM  Result Value Ref Range   Glucose-Capillary 92 70 - 99 mg/dL   Comment 1 Capillary Specimen   Glucose, capillary     Status: None   Collection Time: 07/30/14 11:36 PM  Result Value Ref Range   Glucose-Capillary 70 70 - 99 mg/dL   Comment 1 Capillary Specimen   Glucose, capillary     Status: Abnormal   Collection Time: 07/31/14  3:10 AM  Result Value Ref Range   Glucose-Capillary 57 (L) 70 - 99 mg/dL   Comment 1 Capillary Specimen   Glucose, capillary     Status: Abnormal   Collection Time: 07/31/14  3:32 AM  Result Value Ref Range   Glucose-Capillary 53 (L) 70 - 99 mg/dL   Comment 1 Capillary Specimen   Glucose, capillary     Status: Abnormal   Collection Time: 07/31/14  3:54 AM  Result Value Ref Range   Glucose-Capillary 100 (H) 70 - 99 mg/dL    Imaging / Studies: No results found.  Medications / Allergies:  Scheduled Meds: . aspirin  81 mg Oral Daily  . ferrous sulfate  325 mg Oral TID WC  . insulin aspart  0-15 Units Subcutaneous 6 times per day  . insulin aspart protamine- aspart  20 Units Subcutaneous BID WC  . piperacillin-tazobactam (ZOSYN)  IV  3.375 g Intravenous 3 times per day  . vancomycin  750 mg Intravenous Q12H  . vitamin C  500 mg Oral BID   Continuous Infusions: . sodium chloride     PRN Meds:.acetaminophen **OR** acetaminophen (TYLENOL) oral liquid 160 mg/5  mL, albuterol, HYDROmorphone (DILAUDID) injection, HYDROmorphone (DILAUDID) injection, ondansetron **OR** ondansetron (ZOFRAN) IV, oxyCODONE-acetaminophen  Antibiotics: Anti-infectives    Start     Dose/Rate Route Frequency Ordered Stop   07/30/14 0400  vancomycin (VANCOCIN) IVPB 750 mg/150 ml premix     750 mg 150 mL/hr over 60 Minutes Intravenous Every 12 hours 07/29/14 1553     07/29/14 1600  piperacillin-tazobactam (ZOSYN) IVPB 3.375 g     3.375 g 12.5 mL/hr over 240 Minutes Intravenous 3 times per day 07/29/14 1550     07/29/14 1600  vancomycin (VANCOCIN) 2,000 mg in sodium chloride 0.9 % 500 mL IVPB     2,000 mg 250 mL/hr over 120 Minutes Intravenous  Once 07/29/14 1553 07/29/14 1929   07/29/14 0500  piperacillin-tazobactam (ZOSYN) IVPB 3.375 g     3.375 g 100 mL/hr over 30 Minutes Intravenous  Once 07/29/14 0458 07/29/14 0703   07/29/14 0245  vancomycin (VANCOCIN) IVPB 1000 mg/200 mL premix     1,000 mg 200 mL/hr over 60 Minutes Intravenous  Once 07/29/14 0234 07/29/14 0602        Assessment/Plan POD#2 I&D perirectal abscess -He has a residual pocket towards the scrotum, it is superficial and with adequate pack will drain and heal.  Right now, it is not being packed adequately. -will add hydrotherapy.  Would ideally like it done in the afternoon, we will change morning dressing and nursing to change dressing at HS. -cultures GPC, variable gram rods -mobilize -Vanc/zosyn -follow cultures -percocet for dressing changes -sitz baths -repeat CBC in AM   Epsie Walthall, ANP-BC [facility] Pager [phone](7A-4:30P) For consults and floor pages call [phone](7A-4:30P)  07/31/2014 8:24 AM

## 2014-07-31 NOTE — Progress Notes (Signed)
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 He returned to the ER with 3 days of perirectal pain.  In the ER he was afebrile but his blood pressures were somewhat soft with systolic readings in the 0000000 that improved after IV fluid bolus. White count was 16,500. Exam was concerning for perirectal abscess. CT the abdomen and pelvis demonstrated a fluid collection in the perirectal region.  General surgery was consulted by the EDP.  HPI/Subjective: Pt is resting comfortably at present.  He denies cp, n/v, abdom pain, or sob.    Assessment/Plan:  Sepsis due to perirectal abscess -admit WBC 16.5 w/ RR 22  -2/23 S/P I&D of abscess -ongoing care per Gen Surgery -will stop vanc today and cont zosyn alone   Chronic diastolic CHF / LVH  -Currently compensated and stable on room air  Pacemaker - St.Jude  Pripheral edema secondary to lymphedema -Stable and no worse than baseline  Hypertension -Blood pressure well controlled   Acute on chronic kidney failure (baseline of 1.32) -recheck in AM - keep hydrated   Fabry disease -Receives Fabrazyme every 14 days with last dose on 2/19  COPD with asthma / suspected underlying sleep apnea -Currently stable without wheezing  Iron deficiency anemia -previous admission anemia panel revealed iron level of 20 -Iron 325 mg TID -Vitamin C 500 mg BID  Diabetes type 2 uncontrolled -12/6 Hemoglobin A1c 12.3  Code Status: FULL Family Communication: no family present at time of exam Disposition Plan:  stable for transfer to surgical floor   Consultants: Gen Surgery   Procedure/Significant Events: 2/22 echocardiogram; Left ventricle:  moderate LVH. Appearance myocardium is concerning for infiltrative disease - LVEF=60% 0000000 - grade 1diastolic dysfunction  0000000 IRRIGATION AND DEBRIDEMENT PERIRECTAL ABSCESS  Antibiotics: Zosyn 2/21 > Vancomycin 2/21 > 2/24  DVT prophylaxis: SCD  Objective: Blood pressure 133/66, pulse 77, temperature 99.6 F (37.6 C), temperature source Oral, resp. rate 13, height 5\' 10"  (1.778 m), weight 122.4 kg (269 lb 13.5 oz), SpO2 98 %.  Intake/Output Summary (Last 24 hours) at 07/31/14 1625 Last data filed at 07/31/14 1300  Gross per 24 hour  Intake   1020 ml  Output   1750 ml  Net   -730 ml   Exam: General: No acute respiratory distress Lungs: Clear to auscultation bilaterally without wheezes or crackles Cardiovascular: Regular rate and rhythm without murmur gallop or rub Abdomen: Nontender, nondistended, soft, bowel sounds positive, no rebound, no ascites, no appreciable mass Extremities: No significant cyanosis, clubbing;  trace edema bilateral lower extremities  Data Reviewed: Basic Metabolic Panel:  Recent Labs Lab 07/26/14 0950 07/29/14 0308 07/30/14 0335  NA  --  138 139  K  --  5.1 4.7  CL  --  106 108  CO2  --  24 23  GLUCOSE  --  134* 76  BUN  --  17 22  CREATININE  --  1.69* 1.95*  CALCIUM  --  8.1* 8.0*  PHOS 3.7  --   --    Liver Function Tests:  Recent Labs Lab 07/30/14 0335  AST 29  ALT 22  ALKPHOS 121*  BILITOT  0.5  PROT 6.2  ALBUMIN 1.8*   CBC:  Recent Labs Lab 07/29/14 0308 07/30/14 0335  WBC 16.5* 15.3*  NEUTROABS 12.4*  --   HGB 12.8* 11.4*  HCT 39.4 35.7*  MCV 89.7 92.2  PLT 200 185   CBG:  Recent Labs Lab 07/31/14 0310 07/31/14 0332 07/31/14 0354 07/31/14 0822 07/31/14 1233  GLUCAP 57* 53* 100* 138* 106*    Recent Results (from the past 240 hour(s))  Blood Culture (routine x 2)      Status: None (Preliminary result)   Collection Time: 07/29/14  5:40 AM  Result Value Ref Range Status   Specimen Description BLOOD RIGHT HAND  Final   Special Requests BOTTLES DRAWN AEROBIC AND ANAEROBIC 10CC  Final   Culture   Final           BLOOD CULTURE RECEIVED NO GROWTH TO DATE CULTURE WILL BE HELD FOR 5 DAYS BEFORE ISSUING A FINAL NEGATIVE REPORT Note: Culture results may be compromised due to an excessive volume of blood received in culture bottles. Performed at [facility]    Report Status PENDING  Incomplete  Blood Culture (routine x 2)     Status: None (Preliminary result)   Collection Time: 07/29/14  6:00 AM  Result Value Ref Range Status   Specimen Description BLOOD LEFT HAND  Final   Special Requests BOTTLES DRAWN AEROBIC ONLY 10CC  Final   Culture   Final           BLOOD CULTURE RECEIVED NO GROWTH TO DATE CULTURE WILL BE HELD FOR 5 DAYS BEFORE ISSUING A FINAL NEGATIVE REPORT Performed at [facility]    Report Status PENDING  Incomplete  Anaerobic culture     Status: None (Preliminary result)   Collection Time: 07/29/14 10:15 AM  Result Value Ref Range Status   Specimen Description ABSCESS BUTTOCKS LEFT  Final   Special Requests PT ON VANCOMYCIN AND ZOSYN  Final   Gram Stain PENDING  Incomplete   Culture   Final    NO ANAEROBES ISOLATED; CULTURE IN PROGRESS FOR 5 DAYS Performed at [facility]    Report Status PENDING  Incomplete  Culture, routine-abscess     Status: None (Preliminary result)   Collection Time: 07/29/14 10:15 AM  Result Value Ref Range Status   Specimen Description ABSCESS BUTTOCKS LEFT  Final   Special Requests PT ON VANCOMYCIN AND ZOSYN  Final   Gram Stain   Final    ABUNDANT WBC PRESENT,BOTH PMN AND MONONUCLEAR NO SQUAMOUS EPITHELIAL CELLS SEEN MODERATE GRAM VARIABLE ROD RARE GRAM POSITIVE COCCI IN PAIRS    Culture   Final    MULTIPLE ORGANISMS PRESENT, NONE PREDOMINANT Performed at [facility]     Report Status PENDING  Incomplete  MRSA PCR Screening     Status: None   Collection Time: 07/29/14 12:06 PM  Result Value Ref Range Status   MRSA by PCR NEGATIVE NEGATIVE Final    Comment:        The GeneXpert MRSA Assay (FDA approved for NASAL specimens only), is one component of a comprehensive MRSA colonization surveillance program. It is not intended to diagnose MRSA infection nor to guide or monitor treatment for MRSA infections.      Studies:  Recent x-ray studies have been reviewed in detail by the Attending Physician  Scheduled Meds:  Scheduled Meds: . aspirin  81 mg Oral Daily  . ferrous sulfate  325 mg Oral TID WC  .  insulin aspart  0-15 Units Subcutaneous 6 times per day  . insulin aspart protamine- aspart  20 Units Subcutaneous BID WC  . piperacillin-tazobactam (ZOSYN)  IV  3.375 g Intravenous 3 times per day  . vancomycin  750 mg Intravenous Q12H  . vitamin C  500 mg Oral BID    Time spent on care of this patient: 35 mins  Cherene Altes, MD [facility] For Consults/Admissions - Flow Manager - [phone] Office  [phone]  Contact MD directly via text page:      [email or web address]      password [facility]  07/31/2014, 4:25 PM   LOS: 2 days

## 2014-07-31 NOTE — Progress Notes (Addendum)
pts CBG 57. Pt asymptomatic. Given 4oz of OJ.

## 2014-08-01 DIAGNOSIS — E1129 Type 2 diabetes mellitus with other diabetic kidney complication: Secondary | ICD-10-CM

## 2014-08-01 LAB — CBC
HCT: 36.8 % — ABNORMAL LOW (ref 39.0–52.0)
Hemoglobin: 11.9 g/dL — ABNORMAL LOW (ref 13.0–17.0)
MCH: 30 pg (ref 26.0–34.0)
MCHC: 32.3 g/dL (ref 30.0–36.0)
MCV: 92.7 fL (ref 78.0–100.0)
Platelets: 263 10*3/uL (ref 150–400)
RBC: 3.97 MIL/uL — ABNORMAL LOW (ref 4.22–5.81)
RDW: 13.7 % (ref 11.5–15.5)
WBC: 9.1 10*3/uL (ref 4.0–10.5)

## 2014-08-01 LAB — CULTURE, ROUTINE-ABSCESS

## 2014-08-01 LAB — BASIC METABOLIC PANEL
Anion gap: 11 (ref 5–15)
BUN: 15 mg/dL (ref 6–23)
CO2: 22 mmol/L (ref 19–32)
Calcium: 8.3 mg/dL — ABNORMAL LOW (ref 8.4–10.5)
Chloride: 109 mmol/L (ref 96–112)
Creatinine, Ser: 1.6 mg/dL — ABNORMAL HIGH (ref 0.50–1.35)
GFR calc Af Amer: 55 mL/min — ABNORMAL LOW (ref >90)
GFR calc non Af Amer: 48 mL/min — ABNORMAL LOW (ref >90)
Glucose, Bld: 78 mg/dL (ref 70–99)
Potassium: 4.2 mmol/L (ref 3.5–5.1)
Sodium: 142 mmol/L (ref 135–145)

## 2014-08-01 LAB — GLUCOSE, CAPILLARY
Glucose-Capillary: 71 mg/dL (ref 70–99)
Glucose-Capillary: 185 mg/dL — ABNORMAL HIGH (ref 70–99)
Glucose-Capillary: 219 mg/dL — ABNORMAL HIGH (ref 70–99)
Glucose-Capillary: 109 mg/dL — ABNORMAL HIGH (ref 70–99)

## 2014-08-01 NOTE — Progress Notes (Signed)
[name], male   DOB: [birth year], 54 y.o.   MRN: [id number] [facility] PROGRESS NOTE  CRISTINA HENSEY [id number] DOB: 1961-03-18 DOA: 07/29/2014 PCP: Elizabeth Palau, MD  Brief narrative:    54 y.o. 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 He presented to [facility] ED with perirectal pain. CT abdomen on admission demonstrated a fluid collection in the perirectal region.General surgery was consulted. Pt underwent irrigation and debridement of perirectal abscess 07/30/2014.  Assessment/Plan:    Principal Problem: Sepsis due to perirectal abscess / leukocytosis  - patient is status post I&D of perirectal abscess 07/30/2014 - pt was on broad spectrum antibiotics and currently only on zosyn - appreciate surgery following   Active Problems: Chronic diastolic CHF / LVH  - Compensated   Pacemaker - St.Jude  Pripheral edema secondary to lymphedema - Stable   Hypertension - BP 122/79, stable  Acute on chronic kidney failure - Baseline Cr 1.32 - Creatinine 1.95 --> 1.6 - Improving with IV fluids  Fabry disease - Receives Fabrazyme every 14 days with last dose on 2/19  COPD with asthma / suspected underlying sleep apnea - Stable   Iron deficiency anemia - Continue iron supplementation   Diabetes type 2 uncontrolled with renal manifestations - on 12/6 Hemoglobin A1c 12.3 indicating poor glycemic control - continue novolog 70/30 mix 20 units BID along with SSI - CBG's in past 24 hours: 72, 71, 185   DVT Prophylaxis  - SCD's bilaterally    Code Status: Full.  Family Communication:  plan of care discussed with the patient Disposition Plan: needs IV antibiotics for abscess treatment, not stable for discharge at this time   IV access:  Peripheral  IV  Procedures and diagnostic studies:    Ct Abdomen Pelvis W Contrast 07/29/2014 1. Perianal/perirectal abscess posteriorly and left lateral. This measures approximately 5.5 cm. Prominent left obturator and iliac lymph nodes are likely reactive. 2. Enlarged prostate gland.     Dg Chest Portable 1 View 07/29/2014 Cardiomegaly without localizing pulmonary process.     2/22 2 D ECHO Left ventricle: moderate LVH. Appearance myocardium is concerning for infiltrative disease - LVEF=60% 0000000 - grade 1diastolic dysfunction  0000000 IRRIGATION AND DEBRIDEMENT PERIRECTAL ABSCESS  Medical Consultants:  Surgery  Other Consultants:  Diabetic coordinator  IAnti-Infectives:   Zosyn 2/21 > Vancomycin 2/21 > 2/24   Leisa Lenz, MD  [facility] Pager [phone]  If 7PM-7AM, please contact night-coverage [email or web address] Password [facility] 08/01/2014, 3:40 PM   LOS: 3 days    HPI/Subjective: No acute overnight events.  Objective: Filed Vitals:   07/31/14 1700 07/31/14 2011 07/31/14 2114 08/01/14 0548  BP: 102/54 136/74 125/72 122/79  Pulse:   73 69  Temp: 98.3 F (36.8 C) 99.2 F (37.3 C) 98.2 F (36.8 C) 98.5 F (36.9 C)  TempSrc: Oral Oral Oral Oral  Resp: 15   18  Height:      Weight:      SpO2: 97% 97% 99% 99%    Intake/Output Summary (Last 24 hours) at 08/01/14 1540 Last data filed at 08/01/14 0526  Gross per 24 hour  Intake 1008.75 ml  Output    900 ml  Net 108.75 ml    Exam:   General:  Pt is alert, follows commands appropriately, not in acute distress  Cardiovascular: Regular rate and rhythm, S1/S2, no murmurs  Respiratory: Clear to auscultation bilaterally, no wheezing, no crackles, no rhonchi  Abdomen: Soft, non tender, non distended, bowel sounds present  Extremities: No edema, pulses DP and PT palpable bilaterally  Neuro: Grossly nonfocal  Data Reviewed: Basic Metabolic Panel:  Recent Labs Lab 07/26/14 0950 07/29/14 0308 07/30/14 0335  08/01/14 0515  NA  --  138 139 142  K  --  5.1 4.7 4.2  CL  --  106 108 109  CO2  --  24 23 22   GLUCOSE  --  134* 76 78  BUN  --  17 22 15   CREATININE  --  1.69* 1.95* 1.60*  CALCIUM  --  8.1* 8.0* 8.3*  PHOS 3.7  --   --   --    Liver Function Tests:  Recent Labs Lab 07/30/14 0335  AST 29  ALT 22  ALKPHOS 121*  BILITOT 0.5  PROT 6.2  ALBUMIN 1.8*   No results for input(s): LIPASE, AMYLASE in the last 168 hours. No results for input(s): AMMONIA in the last 168 hours. CBC:  Recent Labs Lab 07/29/14 0308 07/30/14 0335 08/01/14 0515  WBC 16.5* 15.3* 9.1  NEUTROABS 12.4*  --   --   HGB 12.8* 11.4* 11.9*  HCT 39.4 35.7* 36.8*  MCV 89.7 92.2 92.7  PLT 200 185 263   Cardiac Enzymes: No results for input(s): CKTOTAL, CKMB, CKMBINDEX, TROPONINI in the last 168 hours. BNP: Invalid input(s): POCBNP CBG:  Recent Labs Lab 07/31/14 1233 07/31/14 1705 07/31/14 2202 08/01/14 0807 08/01/14 1237  GLUCAP 106* 116* 72 71 185*    Blood Culture (routine x 2)     Status: None (Preliminary result)   Collection Time: 07/29/14  5:40 AM  Result Value Ref Range Status   Specimen Description BLOOD RIGHT HAND  Final   Special Requests BOTTLES DRAWN AEROBIC AND ANAEROBIC 10CC  Final   Culture   Final           BLOOD CULTURE RECEIVED NO GROWTH TO DATE     Report Status PENDING  Incomplete  Blood Culture (routine x 2)     Status: None (Preliminary result)   Collection Time: 07/29/14  6:00 AM  Result Value Ref Range Status   Specimen Description BLOOD LEFT HAND  Final   Special Requests BOTTLES DRAWN AEROBIC ONLY 10CC  Final   Culture   Final           BLOOD CULTURE RECEIVED NO GROWTH TO DATE    Report Status PENDING  Incomplete  Anaerobic culture     Status: None (Preliminary result)   Collection Time: 07/29/14 10:15 AM  Result Value Ref Range Status   Specimen Description ABSCESS BUTTOCKS LEFT  Final   Special Requests PT ON VANCOMYCIN AND ZOSYN  Final   Gram Stain  PENDING  Incomplete   Culture   Final    NO ANAEROBES ISOLATED; CULTURE IN PROGRESS FOR 5 DAYS Performed at [facility]    Report Status PENDING  Incomplete  Culture, routine-abscess     Status: None   Collection Time: 07/29/14 10:15 AM  Result Value Ref Range Status   Specimen Description ABSCESS BUTTOCKS LEFT  Final   Special Requests PT ON VANCOMYCIN AND ZOSYN  Final   Gram Stain   Final    ABUNDANT WBC PRESENT,BOTH PMN AND MONONUCLEAR NO SQUAMOUS EPITHELIAL CELLS SEEN MODERATE GRAM VARIABLE ROD RARE GRAM POSITIVE COCCI IN PAIRS    Culture   Final    MULTIPLE ORGANISMS  PRESENT, NONE PREDOMINANT Note: NO STAPHYLOCOCCUS AUREUS ISOLATED NO GROUP A STREP (S.PYOGENES) ISOLATED Performed at [facility]    Report Status 08/01/2014 FINAL  Final  MRSA PCR Screening     Status: None   Collection Time: 07/29/14 12:06 PM  Result Value Ref Range Status   MRSA by PCR NEGATIVE NEGATIVE Final  Clostridium Difficile by PCR     Status: None   Collection Time: 07/31/14  3:46 PM  Result Value Ref Range Status   C difficile by pcr NEGATIVE NEGATIVE Final     Scheduled Meds: . aspirin  81 mg Oral Daily  . ferrous sulfate  325 mg Oral TID WC  . insulin aspart  0-15 Units Subcutaneous TID WC  . insulin aspart protamine- aspart  20 Units Subcutaneous BID WC  . piperacillin-tazobactam (ZOSYN)  IV  3.375 g Intravenous 3 times per day  . vitamin C  500 mg Oral BID   Continuous Infusions: . sodium chloride 75 mL/hr at 08/01/14 1400

## 2014-08-01 NOTE — Progress Notes (Addendum)
Physical Therapy Wound Evaluation and Treatment Patient Details  Name: [name] MRN: [id number] Date of Birth: [birth year]  Today's Date: 08/01/2014 Time: 1410-1501 Time Calculation (min): 51 min  Subjective  Subjective: "as long as it gets me better" Patient and Family Stated Goals: to get this healed up Prior Treatments: s/p I&D 2/22  Pain Score:  Minimal pain reported.  Wound Assessment  Wound / Incision (Open or Dehisced) 07/26/13 Other (Comment) Back Medial covered (Active)     Wound / Incision (Open or Dehisced) 08/01/14 Other (Comment) Buttocks Left Left buttocks (Active)  Dressing Type Barrier Film (skin prep);Gauze (Comment) 08/01/2014  3:00 PM  Dressing Changed New 08/01/2014  3:00 PM  Dressing Status Clean;Dry;Intact 08/01/2014  3:00 PM  Dressing Change Frequency Twice a day 08/01/2014  3:00 PM  Site / Wound Assessment Granulation tissue;Yellow;Red;Pink 08/01/2014  3:00 PM  % Wound base Red or Granulating 90% 08/01/2014  3:00 PM  % Wound base Yellow 10% 08/01/2014  3:00 PM  Peri-wound Assessment Induration 08/01/2014  3:00 PM  Wound Length (cm) 3.5 cm 08/01/2014  3:00 PM  Wound Width (cm) 1.5 cm 08/01/2014  3:00 PM  Wound Depth (cm) 2.2 cm 08/01/2014  3:00 PM  Tunneling (cm) 4.0 cm at 6-7:00 08/01/2014  3:00 PM  Undermining (cm) 5.5 cm at 3:00-5:00 08/01/2014  3:00 PM  Margins Unattached edges (unapproximated) 08/01/2014  3:00 PM  Closure None 08/01/2014  3:00 PM  Drainage Amount Moderate 08/01/2014  3:00 PM  Drainage Description Serosanguineous 08/01/2014  3:00 PM  Treatment Cleansed;Debridement (Selective);Hydrotherapy (Pulse lavage) 08/01/2014  3:00 PM     Incision 03/22/12 Abdomen Other (Comment) (Active)     Incision (Closed) 07/27/13 Back Other (Comment) (Active)     Incision (Closed) 07/29/14 Buttocks Left (Active)  Dressing Type Gauze (Comment) 08/01/2014  8:51 AM  Dressing Changed 07/31/2014 11:00 PM  Dressing Change Frequency Twice a day 07/31/2014  9:41 PM  Site /  Wound Assessment Dressing in place / Unable to assess 07/31/2014  9:41 PM  Drainage Amount Moderate 07/30/2014 11:00 PM  Drainage Description Serosanguineous 07/30/2014 11:00 PM  Treatment Other (Comment) 07/31/2014  7:00 AM   Hydrotherapy Pulsed lavage therapy - wound location: left buttocks Pulsed Lavage with Suction (psi): 4 psi (-8 psi) Pulsed Lavage with Suction - Normal Saline Used: 1000 mL Pulsed Lavage Tip: Tunneling tip Selective Debridement Selective Debridement - Location: left buttocks  Selective Debridement - Tools Used: Scissors;Forceps (Q tip) Selective Debridement - Tissue Removed: Fibrin, slough. (Opened up tunnel to more distal wound on buttock) Second wound 1.3 cm X .9 Cm x 1.3 cm with underming 3.4 cm at 3:00.  Wound Assessment and Plan  Wound Therapy - Assess/Plan/Recommendations Wound Therapy - Clinical Statement: Pt would benefit from PLS to irrigate and cleanse tunneling/undermining portions of wound bed to allow for selective debridement, to decrease bacterial load and promote wound healing. Additional underming noted in distal wound on left buttock- 3.4 cm at 3:00 after debriding fibrin on surface.  Factors Delaying/Impairing Wound Healing: Infection - systemic/local;Diabetes Mellitus Hydrotherapy Plan: Debridement;Dressing change;Patient/family education;Pulsatile lavage with suction Wound Therapy - Frequency: 6X / week Wound Therapy - Current Recommendations: Surgery consult;PT;WOC nurse Wound Therapy - Follow Up Recommendations: W[facility] Center;Home health RN Wound Plan: see above  Wound Therapy Goals- Improve the function of patient's integumentary system by progressing the wound(s) through the phases of wound healing (inflammation - proliferation - remodeling) by: Decrease Necrotic Tissue to: <5% Decrease Necrotic Tissue - Progress: Goal set today Increase  Granulation Tissue to: >95% Increase Granulation Tissue - Progress: Goal set today Improve Drainage  Characteristics: Min Improve Drainage Characteristics - Progress: Goal set today Goals/treatment plan/discharge plan were made with and agreed upon by patient/family: Yes Time For Goal Achievement: 6 days Wound Therapy - Potential for Goals: Good  Goals will be updated until maximal potential achieved or discharge criteria met.  Discharge criteria: when goals achieved, discharge from hospital, MD decision/surgical intervention, no progress towards goals, refusal/missing three consecutive treatments without notification or medical reason.  GP     Candy Sledge A 08/01/2014, 3:19 PM Candy Sledge, [place], DPT [phone]

## 2014-08-01 NOTE — Progress Notes (Signed)
[name], male   DOB: [birth year], 54 y.o.   MRN: [id number] 3 Days Post-Op  Subjective: Pt c/o some diarrhea today.  Only 2x a day.  Otherwise no other complaints  Objective: Vital signs in last 24 hours: Temp:  [98.2 F (36.8 C)-99.6 F (37.6 C)] 98.5 F (36.9 C) (02/25 0548) Pulse Rate:  [69-73] 69 (02/25 0548) Resp:  [15-18] 18 (02/25 0548) BP: (102-136)/(54-79) 122/79 mmHg (02/25 0548) SpO2:  [97 %-99 %] 99 % (02/25 0548) Last BM Date: 08/01/14  Intake/Output from previous day: 02/24 0701 - 02/25 0700 In: 1791.3 [P.O.:720; I.V.:908.8; IV Piggyback:162.5] Out: 1200 [Urine:1200] Intake/Output this shift:    PE: Skin: wound is contaminated with stool currently.  No active purulent drainage with packing in place.  Will recheck wound with hydrotherapy today.  Lab Results:   Recent Labs  07/30/14 0335 08/01/14 0515  WBC 15.3* 9.1  HGB 11.4* 11.9*  HCT 35.7* 36.8*  PLT 185 263   BMET  Recent Labs  07/30/14 0335 08/01/14 0515  NA 139 142  K 4.7 4.2  CL 108 109  CO2 23 22  GLUCOSE 76 78  BUN 22 15  CREATININE 1.95* 1.60*  CALCIUM 8.0* 8.3*   PT/INR  Recent Labs  07/30/14 0335  LABPROT 15.7*  INR 1.24   CMP     Component Value Date/Time   NA 142 08/01/2014 0515   K 4.2 08/01/2014 0515   CL 109 08/01/2014 0515   CO2 22 08/01/2014 0515   GLUCOSE 78 08/01/2014 0515   BUN 15 08/01/2014 0515   CREATININE 1.60* 08/01/2014 0515   CALCIUM 8.3* 08/01/2014 0515   PROT 6.2 07/30/2014 0335   ALBUMIN 1.8* 07/30/2014 0335   AST 29 07/30/2014 0335   ALT 22 07/30/2014 0335   ALKPHOS 121* 07/30/2014 0335   BILITOT 0.5 07/30/2014 0335   GFRNONAA 48* 08/01/2014 0515   GFRAA 55* 08/01/2014 0515   Lipase  No results found for: LIPASE     Studies/Results: No results found.  Anti-infectives: Anti-infectives    Start     Dose/Rate Route Frequency Ordered Stop   07/30/14 0400  vancomycin (VANCOCIN) IVPB 750 mg/150 ml premix  Status:   Discontinued     750 mg 150 mL/hr over 60 Minutes Intravenous Every 12 hours 07/29/14 1553 07/31/14 1712   07/29/14 1600  piperacillin-tazobactam (ZOSYN) IVPB 3.375 g     3.375 g 12.5 mL/hr over 240 Minutes Intravenous 3 times per day 07/29/14 1550     07/29/14 1600  vancomycin (VANCOCIN) 2,000 mg in sodium chloride 0.9 % 500 mL IVPB     2,000 mg 250 mL/hr over 120 Minutes Intravenous  Once 07/29/14 1553 07/29/14 1929   07/29/14 0500  piperacillin-tazobactam (ZOSYN) IVPB 3.375 g     3.375 g 100 mL/hr over 30 Minutes Intravenous  Once 07/29/14 0458 07/29/14 0703   07/29/14 0245  vancomycin (VANCOCIN) IVPB 1000 mg/200 mL premix     1,000 mg 200 mL/hr over 60 Minutes Intravenous  Once 07/29/14 0234 07/29/14 0602       Assessment/Plan   POD#3 I&D perirectal abscess -will add hydrotherapy and evaluate wound better with them today. -cultures multiple organisms.  None predominant -mobilize -Vanc/zosyn -percocet for dressing changes -sitz baths Diarrhea -unclear cause.  ? Reactivity to abx thearpy  LOS: 3 days    Safire Gordin E 08/01/2014, 9:40 AM Pager: [phone]  ADDENDUM: Wound is fairly clean.  Small tunnel anterior with an area of fibrin.  I d/w PT hydrotherapy to work on this area as well as in the wound.  This does connect to his wound.  If this can be debrided off, it will make his wound even easier to pack.  Continue current care.   Minnie Shi E 2:17 PM

## 2014-08-01 NOTE — Progress Notes (Addendum)
ANTIBIOTIC CONSULT NOTE  Pharmacy Consult for Zosyn Indication: IRRIGATION AND DEBRIDEMENT PERIRECTAL ABSCESS   Allergies  Allergen Reactions  . Shellfish Allergy Hives and Swelling    Patient Measurements: Height: 5\' 10"  (177.8 cm) Weight: 269 lb 13.5 oz (122.4 kg) IBW/kg (Calculated) : 73   Vital Signs: Temp: 98.5 F (36.9 C) (02/25 0548) Temp Source: Oral (02/25 0548) BP: 122/79 mmHg (02/25 0548) Pulse Rate: 69 (02/25 0548) Intake/Output from previous day: 02/24 0701 - 02/25 0700 In: 1791.3 [P.O.:720; I.V.:908.8; IV Piggyback:162.5] Out: 1200 [Urine:1200] Intake/Output from this shift:    Labs:  Recent Labs  07/30/14 0335 08/01/14 0515  WBC 15.3* 9.1  HGB 11.4* 11.9*  PLT 185 263  CREATININE 1.95* 1.60*   Estimated Creatinine Clearance: 70.1 mL/min (by C-G formula based on Cr of 1.6). No results for input(s): VANCOTROUGH, VANCOPEAK, VANCORANDOM, GENTTROUGH, GENTPEAK, GENTRANDOM, TOBRATROUGH, TOBRAPEAK, TOBRARND, AMIKACINPEAK, AMIKACINTROU, AMIKACIN in the last 72 hours.   Microbiology: Recent Results (from the past 720 hour(s))  MRSA PCR Screening     Status: None   Collection Time: 07/12/14  1:18 PM  Result Value Ref Range Status   MRSA by PCR NEGATIVE NEGATIVE Final    Comment:        The GeneXpert MRSA Assay (FDA approved for NASAL specimens only), is one component of a comprehensive MRSA colonization surveillance program. It is not intended to diagnose MRSA infection nor to guide or monitor treatment for MRSA infections.   Blood Culture (routine x 2)     Status: None (Preliminary result)   Collection Time: 07/29/14  5:40 AM  Result Value Ref Range Status   Specimen Description BLOOD RIGHT HAND  Final   Special Requests BOTTLES DRAWN AEROBIC AND ANAEROBIC 10CC  Final   Culture   Final           BLOOD CULTURE RECEIVED NO GROWTH TO DATE CULTURE WILL BE HELD FOR 5 DAYS BEFORE ISSUING A FINAL NEGATIVE REPORT Note: Culture results may be compromised  due to an excessive volume of blood received in culture bottles. Performed at [facility]    Report Status PENDING  Incomplete  Blood Culture (routine x 2)     Status: None (Preliminary result)   Collection Time: 07/29/14  6:00 AM  Result Value Ref Range Status   Specimen Description BLOOD LEFT HAND  Final   Special Requests BOTTLES DRAWN AEROBIC ONLY 10CC  Final   Culture   Final           BLOOD CULTURE RECEIVED NO GROWTH TO DATE CULTURE WILL BE HELD FOR 5 DAYS BEFORE ISSUING A FINAL NEGATIVE REPORT Performed at [facility]    Report Status PENDING  Incomplete  Anaerobic culture     Status: None (Preliminary result)   Collection Time: 07/29/14 10:15 AM  Result Value Ref Range Status   Specimen Description ABSCESS BUTTOCKS LEFT  Final   Special Requests PT ON VANCOMYCIN AND ZOSYN  Final   Gram Stain PENDING  Incomplete   Culture   Final    NO ANAEROBES ISOLATED; CULTURE IN PROGRESS FOR 5 DAYS Performed at [facility]    Report Status PENDING  Incomplete  Culture, routine-abscess     Status: None   Collection Time: 07/29/14 10:15 AM  Result Value Ref Range Status   Specimen Description ABSCESS BUTTOCKS LEFT  Final   Special Requests PT ON VANCOMYCIN AND ZOSYN  Final   Gram Stain   Final    ABUNDANT WBC  PRESENT,BOTH PMN AND MONONUCLEAR NO SQUAMOUS EPITHELIAL CELLS SEEN MODERATE GRAM VARIABLE ROD RARE GRAM POSITIVE COCCI IN PAIRS    Culture   Final    MULTIPLE ORGANISMS PRESENT, NONE PREDOMINANT Note: NO STAPHYLOCOCCUS AUREUS ISOLATED NO GROUP A STREP (S.PYOGENES) ISOLATED Performed at [facility]    Report Status 08/01/2014 FINAL  Final  MRSA PCR Screening     Status: None   Collection Time: 07/29/14 12:06 PM  Result Value Ref Range Status   MRSA by PCR NEGATIVE NEGATIVE Final    Comment:        The GeneXpert MRSA Assay (FDA approved for NASAL specimens only), is one component of a comprehensive MRSA colonization surveillance  program. It is not intended to diagnose MRSA infection nor to guide or monitor treatment for MRSA infections.   Clostridium Difficile by PCR     Status: None   Collection Time: 07/31/14  3:46 PM  Result Value Ref Range Status   C difficile by pcr NEGATIVE NEGATIVE Final    Assessment: 53yom admitted with left buttock pain - s/p I&D of abcess.  Vancomycin stopped by IM yesterday, remains on Zosyn. WBC now WNL at 9.1, patient is afebrile. Having hydrotherapy and sitz baths as well.  SCr improved at 1.6. Normalized CrCl ~18mL/min.  Cultures did not grow anything predominantly. No S. Aureus or S. Pyrogenes isolated.  Goal of Therapy:  Proper dosing based on renal and hepatic function  Plan:  -Zosyn 3.375GM IV q8 EI -pharmacy to sign off as no dose adjustments anticipated. Recommend de-escalating antibiotic spectrum when able.    Mauri Temkin D. Ruble Pumphrey, PharmD, BCPS Clinical Pharmacist Pager: [phone] 08/01/2014 10:49 AM

## 2014-08-02 DIAGNOSIS — N183 Chronic kidney disease, stage 3 (moderate): Secondary | ICD-10-CM

## 2014-08-02 LAB — GLUCOSE, CAPILLARY
Glucose-Capillary: 103 mg/dL — ABNORMAL HIGH (ref 70–99)
Glucose-Capillary: 130 mg/dL — ABNORMAL HIGH (ref 70–99)
Glucose-Capillary: 148 mg/dL — ABNORMAL HIGH (ref 70–99)

## 2014-08-02 MED ORDER — AMOXICILLIN-POT CLAVULANATE 875-125 MG PO TABS: 1.0000 | ORAL_TABLET | Freq: Two times a day (BID) | ORAL | Status: DC

## 2014-08-02 MED ORDER — SACCHAROMYCES BOULARDII 250 MG PO CAPS
250.0000 mg | ORAL_CAPSULE | Freq: Two times a day (BID) | ORAL | Status: DC
  Administered 2014-08-02: 250 mg via ORAL
  Filled 2014-08-02 (×2): qty 1

## 2014-08-02 MED ORDER — INSULIN NPH ISOPHANE & REGULAR (70-30) 100 UNIT/ML ~~LOC~~ SUSP
20.0000 [IU] | Freq: Two times a day (BID) | SUBCUTANEOUS | Status: DC
Start: 2014-08-02 — End: 2014-08-30

## 2014-08-02 MED ORDER — SACCHAROMYCES BOULARDII 250 MG PO CAPS: 250.0000 mg | ORAL_CAPSULE | Freq: Two times a day (BID) | ORAL | Status: DC

## 2014-08-02 MED ORDER — ASCORBIC ACID 500 MG PO TABS: 500.0000 mg | ORAL_TABLET | Freq: Two times a day (BID) | ORAL | Status: DC

## 2014-08-02 MED ORDER — OXYCODONE-ACETAMINOPHEN 5-325 MG PO TABS: 1.0000 | ORAL_TABLET | ORAL | Status: DC | PRN

## 2014-08-02 NOTE — Progress Notes (Signed)
[facility] Progress Note  4 Days Post-Op  Subjective: Pt says pain is much improved, he feels much better.  Tolerating diet.  Ambulating well.  No complaints.  Has a lot of questions about his insulin, uncontrolled diabetes, abscess, and wound healing.  Objective: Vital signs in last 24 hours: Temp:  [98.5 F (36.9 C)-98.8 F (37.1 C)] 98.5 F (36.9 C) (02/26 0546) Pulse Rate:  [71-76] 76 (02/26 0546) Resp:  [16] 16 (02/26 0546) BP: (118-134)/(78-81) 134/78 mmHg (02/26 0546) SpO2:  [100 %] 100 % (02/26 0546) Weight:  [272 lb 4.3 oz (123.5 kg)] 272 lb 4.3 oz (123.5 kg) (02/26 0546) Last BM Date: 08/01/14  Intake/Output from previous day: 02/25 0701 - 02/26 0700 In: 2005 [P.O.:120; I.V.:1835; IV Piggyback:50] Out: 900 [Urine:900] Intake/Output this shift: Total I/O In: -  Out: 400 [Urine:400]  PE: Gen:  Alert, NAD, pleasant Peri-rectum:  Still indurated but with less erythema, less purulent drainage, less tenderness, no foul smell  Lab Results:   Recent Labs  08/01/14 0515  WBC 9.1  HGB 11.9*  HCT 36.8*  PLT 263   BMET  Recent Labs  08/01/14 0515  NA 142  K 4.2  CL 109  CO2 22  GLUCOSE 78  BUN 15  CREATININE 1.60*  CALCIUM 8.3*   PT/INR No results for input(s): LABPROT, INR in the last 72 hours. CMP     Component Value Date/Time   NA 142 08/01/2014 0515   K 4.2 08/01/2014 0515   CL 109 08/01/2014 0515   CO2 22 08/01/2014 0515   GLUCOSE 78 08/01/2014 0515   BUN 15 08/01/2014 0515   CREATININE 1.60* 08/01/2014 0515   CALCIUM 8.3* 08/01/2014 0515   PROT 6.2 07/30/2014 0335   ALBUMIN 1.8* 07/30/2014 0335   AST 29 07/30/2014 0335   ALT 22 07/30/2014 0335   ALKPHOS 121* 07/30/2014 0335   BILITOT 0.5 07/30/2014 0335   GFRNONAA 48* 08/01/2014 0515   GFRAA 55* 08/01/2014 0515   Lipase  No results found for: LIPASE     Studies/Results: No results found.  Anti-infectives: Anti-infectives    Start     Dose/Rate Route Frequency  Ordered Stop   07/30/14 0400  vancomycin (VANCOCIN) IVPB 750 mg/150 ml premix  Status:  Discontinued     750 mg 150 mL/hr over 60 Minutes Intravenous Every 12 hours 07/29/14 1553 07/31/14 1712   07/29/14 1600  piperacillin-tazobactam (ZOSYN) IVPB 3.375 g     3.375 g 12.5 mL/hr over 240 Minutes Intravenous 3 times per day 07/29/14 1550     07/29/14 1600  vancomycin (VANCOCIN) 2,000 mg in sodium chloride 0.9 % 500 mL IVPB     2,000 mg 250 mL/hr over 120 Minutes Intravenous  Once 07/29/14 1553 07/29/14 1929   07/29/14 0500  piperacillin-tazobactam (ZOSYN) IVPB 3.375 g     3.375 g 100 mL/hr over 30 Minutes Intravenous  Once 07/29/14 0458 07/29/14 0703   07/29/14 0245  vancomycin (VANCOCIN) IVPB 1000 mg/200 mL premix     1,000 mg 200 mL/hr over 60 Minutes Intravenous  Once 07/29/14 0234 07/29/14 0602       Assessment/Plan Perirectal abscess POD#4 I&D (07/29/14 - Dr. Hulen Skains) -culture shows  -mobilize, IS -Vanc/zosyn, home on Cipro or Augmentin for 7 days -percocet for dressing changes -sitz baths -hydrotherapy to work on tunneled area of the wound -reg diet -discussed consequences of uncontrolled diabetes and risks for infections/repeat abscesses/poor healing Diarrhea -unclear cause. ? Reactivity to abx thearpy -c.diff negative -  add probiotic Disp -discharge home when okay with medicine service    LOS: 4 days    DORT, [place] 08/02/2014, 7:45 AM Pager: [phone]

## 2014-08-02 NOTE — Discharge Summary (Signed)
Physician Discharge Summary  [name] [id number] DOB: 03/01/1961 DOA: 07/29/2014  PCP: Elizabeth Palau, MD  Admit date: 07/29/2014 Discharge date: 08/02/2014  Recommendations for Outpatient Follow-up:  1. Continue Augmentin for 7 days on discharge. 2. Please follow-up with primary care physician in about 1-2 weeks after discharge to make sure symptoms are controlled.  Discharge Diagnoses:  Principal Problem:   Abscess, gluteal, left Active Problems:   Fabry disease   Hypertension   COPD with asthma   Pacemaker-St.Jude   Peripheral edema   Chronic diastolic HF (heart failure)   CKD (chronic kidney disease), stage III   Iron deficiency anemia   Perirectal abscess   Acute on chronic renal failure   Perirectal fistula   Blood poisoning   Diabetes type 2, uncontrolled    Discharge Condition: stable   Diet recommendation: as tolerated   History of present illness:  54 y.o. 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 He presented to [facility] ED with perirectal pain. CT abdomen on admission demonstrated a fluid collection in the perirectal region.General surgery was consulted. Pt underwent irrigation and debridement of perirectal abscess 07/30/2014.  Assessment/Plan:    Principal Problem: Sepsis due to perirectal abscess / leukocytosis  - patient is status post I&D of perirectal abscess 07/30/2014 - pt was on broad spectrum antibiotics since admission which was then downgraded to zosyn only. - Per surgery patient will be discharged on Augmentin for 7 days. - Home health RN order placed for help with dressing changes   Active Problems: Chronic diastolic CHF / LVH  - Compensated    Pacemaker - St.Jude  Pripheral edema secondary to lymphedema - Stable   Hypertension -  Continue home meds  Acute on chronic kidney failure - Baseline Cr 1.32 - Creatinine 1.95 --> 1.6 - Improved with IV fluids  Fabry disease - Receives Fabrazyme every 14 days with last dose on 2/19  COPD with asthma / suspected underlying sleep apnea - Stable   Anemia of chronic disease  - Secondary to CKD. - hemoglobin stable, i spoke with pt to check with PCP if iron supplement required. Patient has chronic kidney disease which can contribute to anemia. Iron supplementation may cause patient to be more constipated which was not recommended at this time while he is healing from recent perirectal abscess infection and after I&D.  Diabetes type 2 uncontrolled with renal manifestations - on 12/6 Hemoglobin A1c 12.3 indicating poor glycemic control - pt refused to take insulin which was given in hospital. He will take insulin per prior home regimen and follow up with PCP to see if this needs adjustment    DVT Prophylaxis  - SCD's bilaterally while pt is in hospital    Code Status: Full.  Family Communication: plan of care discussed with the patient  IV access:  Peripheral IV  Procedures and diagnostic studies:   Ct Abdomen Pelvis W Contrast 07/29/2014 1. Perianal/perirectal abscess posteriorly and left lateral. This measures approximately 5.5 cm. Prominent left obturator and iliac lymph nodes are likely reactive. 2. Enlarged prostate gland.   Dg Chest Portable 1 View 07/29/2014 Cardiomegaly without localizing pulmonary process.   2/22 2 D ECHO Left ventricle: moderate LVH. Appearance myocardium is concerning for infiltrative disease - LVEF=60% 0000000 - grade 1diastolic dysfunction  0000000 IRRIGATION AND DEBRIDEMENT PERIRECTAL ABSCESS  Medical Consultants:  Surgery  Other Consultants:  Diabetic  coordinator  IAnti-Infectives:   Zosyn 2/21 > 2/26 Vancomycin 2/21 > 2/24 Augmentin on discharge for 7 days   Signed:  Leisa Lenz, MD  [facility] 08/02/2014, 10:18 AM  Pager #: [phone]   Discharge Exam: Filed Vitals:   08/02/14 0546  BP: 134/78  Pulse: 76  Temp: 98.5 F (36.9 C)  Resp: 16   Filed Vitals:   07/31/14 2114 08/01/14 0548 08/01/14 2108 08/02/14 0546  BP: 125/72 122/79 118/81 134/78  Pulse: 73 69 71 76  Temp: 98.2 F (36.8 C) 98.5 F (36.9 C) 98.8 F (37.1 C) 98.5 F (36.9 C)  TempSrc: Oral Oral Oral Oral  Resp:  18 16 16   Height:      Weight:    123.5 kg (272 lb 4.3 oz)  SpO2: 99% 99% 100% 100%    General: Pt is alert, follows commands appropriately, not in acute distress Cardiovascular: Regular rate and rhythm, S1/S2 +, no murmurs Respiratory: Clear to auscultation bilaterally, no wheezing, no crackles, no rhonchi Abdominal: Soft, non tender, non distended, bowel sounds +, no guarding Extremities: no edema, no cyanosis, pulses palpable bilaterally DP and PT Neuro: Grossly nonfocal  Discharge Instructions  Discharge Instructions    Call MD for:  difficulty breathing, headache or visual disturbances    Complete by:  As directed      Call MD for:  persistant nausea and vomiting    Complete by:  As directed      Call MD for:  severe uncontrolled pain    Complete by:  As directed      Diet - low sodium heart healthy    Complete by:  As directed      Discharge instructions    Complete by:  As directed   1. Continue Augmentin for 7 days on discharge. 2. Please follow-up with primary care physician in about 1-2 weeks after discharge to make sure symptoms are controlled.     Increase activity slowly    Complete by:  As directed             Medication List    STOP taking these medications        insulin aspart 100 UNIT/ML injection  Commonly known as:  novoLOG     insulin detemir 100 UNIT/ML injection  Commonly known as:  LEVEMIR      TAKE these medications        acetaminophen 500 MG tablet  Commonly known as:  TYLENOL  Only taking 1 hour before infusion.  AS premed  for Fabrazyme     ALBUTEROL SULFATE HFA IN  Inhale 1 puff into the lungs daily as needed (shortness of breath or wheezing).     allopurinol 300 MG tablet  Commonly known as:  ZYLOPRIM  Take 300 mg by mouth daily.     amoxicillin-clavulanate 875-125 MG per tablet  Commonly known as:  AUGMENTIN  Take 1 tablet by mouth 2 (two) times daily.     ascorbic acid 500 MG tablet  Commonly known as:  VITAMIN C  Take 1 tablet (500 mg total) by mouth 2 (two) times daily.     aspirin 81 MG tablet  Take 81 mg by mouth daily.     colchicine 0.6 MG tablet  Take 0.6 mg by mouth daily.     eplerenone 50 MG tablet  Commonly known as:  INSPRA  Take 50 mg by mouth daily.     FABRAZYME IV  Inject into the vein every 14 (  fourteen) days.     fexofenadine 180 MG tablet  Commonly known as:  ALLEGRA  Take 180 mg by mouth every 14 (fourteen) days.     insulin NPH-regular Human (70-30) 100 UNIT/ML injection  Commonly known as:  NOVOLIN 70/30  Inject 20 Units into the skin 2 (two) times daily with a meal. Take if Levimir is not approved     Insulin Syringe-Needle U-100 29G X 1/2" 0.3 ML Misc  Dispense syringes and needles as needed- any brand is OK     oxyCODONE-acetaminophen 5-325 MG per tablet  Commonly known as:  PERCOCET/ROXICET  Take 1-2 tablets by mouth every 4 (four) hours as needed for moderate pain.     saccharomyces boulardii 250 MG capsule  Commonly known as:  FLORASTOR  Take 1 capsule (250 mg total) by mouth 2 (two) times daily.     senna 8.6 MG Tabs tablet  Commonly known as:  SENOKOT  Take 2 tablets (17.2 mg total) by mouth at bedtime as needed for mild constipation.           Follow-up Information    Follow up with Elizabeth Palau, MD. Schedule an appointment as soon as possible for a visit in 1 week.   Specialty:  Family Medicine   Why:  Follow up appt after recent hospitalization   Contact information:   [place]  [id number] [phone]        The results of significant diagnostics from this hospitalization (including imaging, microbiology, ancillary and laboratory) are listed below for reference.    Significant Diagnostic Studies: Dg Chest 2 View  07/12/2014   CLINICAL DATA:  Hyperglycemia, dizziness, nausea.  EXAM: CHEST  2 VIEW  COMPARISON:  07/30/2013  FINDINGS: Left pacer remains in place, unchanged. Heart is upper limits normal in size. Lungs are clear. No effusions or pneumothorax. No acute bony abnormality.  IMPRESSION: No active cardiopulmonary disease.   Electronically Signed   By: Rolm Baptise M.D.   On: 07/12/2014 10:24   Ct Abdomen Pelvis W Contrast  07/29/2014   CLINICAL DATA:  Left buttock abscess.  EXAM: CT ABDOMEN AND PELVIS WITH CONTRAST  TECHNIQUE: Multidetector CT imaging of the abdomen and pelvis was performed using the standard protocol following bolus administration of intravenous contrast.  CONTRAST:  137mL OMNIPAQUE IOHEXOL 300 MG/ML  SOLN  COMPARISON:  CT 04/04/2009  FINDINGS: Minimal atelectasis in the dependent right lower lobe. Pacemaker wires are partially included, the heart is at the upper limits of normal in size.  There is a perirectal/perianal fluid collection extending to the anorectal junction posteriorly. This is irregular in shape measuring at least 5.5 x 5.4 x 5.5 cm. Small foci of air seen within the fluid collection with peripheral enhanced. There is associated skin thickening and soft tissue edema of the left buttock. No associated deep pelvic fluid collection.  Enlarged left obturator lymph node measures 1.6 cm in short axis dimension. There is a 1.2 cm left external iliac lymph node. Additional smaller bilateral inguinal and iliac lymph nodes are seen.  Focal fatty infiltration adjacent to the falciform ligament, no focal hepatic lesion. The gallbladder is physiologically distended. The spleen, adrenal glands, and pancreas are normal. Kidneys demonstrate symmetric  enhancement and excretion. No hydronephrosis or focal renal abnormality. There are no dilated or thickened bowel loops. The appendix is normal.  Abdominal aorta is normal in caliber. There is no retroperitoneal adenopathy. The urinary bladder is physiologically distended. The prostate gland  is enlarged measuring 5.5 x 4.3 cm. No pelvic ascites.  Degenerative disc disease at L4-L5. Facet arthropathy in the lower lumbar spine. No acute osseous abnormality.  IMPRESSION: 1. Perianal/perirectal abscess posteriorly and left lateral. This measures approximately 5.5 cm. Prominent left obturator and iliac lymph nodes are likely reactive. 2. Enlarged prostate gland.   Electronically Signed   By: Jeb Levering M.D.   On: 07/29/2014 04:46   Dg Chest Portable 1 View  07/29/2014   CLINICAL DATA:  Sepsis.  Cough.  EXAM: PORTABLE CHEST - 1 VIEW  COMPARISON:  07/12/2014  FINDINGS: Dual lead left-sided pacemaker in place. The heart is enlarged. Pulmonary vasculature is normal for technique. There is no consolidation, pleural effusion, or pneumothorax. No acute osseous abnormalities are seen.  IMPRESSION: Cardiomegaly without localizing pulmonary process.   Electronically Signed   By: Jeb Levering M.D.   On: 07/29/2014 06:25    Microbiology: Recent Results (from the past 240 hour(s))  Blood Culture (routine x 2)     Status: None (Preliminary result)   Collection Time: 07/29/14  5:40 AM  Result Value Ref Range Status   Specimen Description BLOOD RIGHT HAND  Final   Special Requests BOTTLES DRAWN AEROBIC AND ANAEROBIC 10CC  Final   Culture   Final           BLOOD CULTURE RECEIVED NO GROWTH TO DATE CULTURE WILL BE HELD FOR 5 DAYS BEFORE ISSUING A FINAL NEGATIVE REPORT Note: Culture results may be compromised due to an excessive volume of blood received in culture bottles. Performed at [facility]    Report Status PENDING  Incomplete  Blood Culture (routine x 2)     Status: None (Preliminary result)    Collection Time: 07/29/14  6:00 AM  Result Value Ref Range Status   Specimen Description BLOOD LEFT HAND  Final   Special Requests BOTTLES DRAWN AEROBIC ONLY 10CC  Final   Culture   Final           BLOOD CULTURE RECEIVED NO GROWTH TO DATE CULTURE WILL BE HELD FOR 5 DAYS BEFORE ISSUING A FINAL NEGATIVE REPORT Performed at [facility]    Report Status PENDING  Incomplete  Anaerobic culture     Status: None (Preliminary result)   Collection Time: 07/29/14 10:15 AM  Result Value Ref Range Status   Specimen Description ABSCESS BUTTOCKS LEFT  Final   Special Requests PT ON VANCOMYCIN AND ZOSYN  Final   Gram Stain PENDING  Incomplete   Culture   Final    NO ANAEROBES ISOLATED; CULTURE IN PROGRESS FOR 5 DAYS Performed at [facility]    Report Status PENDING  Incomplete  Culture, routine-abscess     Status: None   Collection Time: 07/29/14 10:15 AM  Result Value Ref Range Status   Specimen Description ABSCESS BUTTOCKS LEFT  Final   Special Requests PT ON VANCOMYCIN AND ZOSYN  Final   Gram Stain   Final    ABUNDANT WBC PRESENT,BOTH PMN AND MONONUCLEAR NO SQUAMOUS EPITHELIAL CELLS SEEN MODERATE GRAM VARIABLE ROD RARE GRAM POSITIVE COCCI IN PAIRS    Culture   Final    MULTIPLE ORGANISMS PRESENT, NONE PREDOMINANT Note: NO STAPHYLOCOCCUS AUREUS ISOLATED NO GROUP A STREP (S.PYOGENES) ISOLATED Performed at [facility]    Report Status 08/01/2014 FINAL  Final  MRSA PCR Screening     Status: None   Collection Time: 07/29/14 12:06 PM  Result Value Ref Range Status   MRSA by  PCR NEGATIVE NEGATIVE Final    Comment:        The GeneXpert MRSA Assay (FDA approved for NASAL specimens only), is one component of a comprehensive MRSA colonization surveillance program. It is not intended to diagnose MRSA infection nor to guide or monitor treatment for MRSA infections.   Clostridium Difficile by PCR     Status: None   Collection Time: 07/31/14  3:46 PM  Result  Value Ref Range Status   C difficile by pcr NEGATIVE NEGATIVE Final     Labs: Basic Metabolic Panel:  Recent Labs Lab 07/29/14 0308 07/30/14 0335 08/01/14 0515  NA 138 139 142  K 5.1 4.7 4.2  CL 106 108 109  CO2 24 23 22   GLUCOSE 134* 76 78  BUN 17 22 15   CREATININE 1.69* 1.95* 1.60*  CALCIUM 8.1* 8.0* 8.3*   Liver Function Tests:  Recent Labs Lab 07/30/14 0335  AST 29  ALT 22  ALKPHOS 121*  BILITOT 0.5  PROT 6.2  ALBUMIN 1.8*   No results for input(s): LIPASE, AMYLASE in the last 168 hours. No results for input(s): AMMONIA in the last 168 hours. CBC:  Recent Labs Lab 07/29/14 0308 07/30/14 0335 08/01/14 0515  WBC 16.5* 15.3* 9.1  NEUTROABS 12.4*  --   --   HGB 12.8* 11.4* 11.9*  HCT 39.4 35.7* 36.8*  MCV 89.7 92.2 92.7  PLT 200 185 263   Cardiac Enzymes: No results for input(s): CKTOTAL, CKMB, CKMBINDEX, TROPONINI in the last 168 hours. BNP: BNP (last 3 results) No results for input(s): BNP in the last 8760 hours.  ProBNP (last 3 results) No results for input(s): PROBNP in the last 8760 hours.  CBG:  Recent Labs Lab 08/01/14 0807 08/01/14 1237 08/01/14 1647 08/01/14 2106 08/02/14 0734  GLUCAP 71 185* 219* 109* 103*    Time coordinating discharge: Over 30 minutes

## 2014-08-02 NOTE — Progress Notes (Signed)
Physical Therapy Wound Treatment Patient Details  Name: [name] MRN: [id number] Date of Birth: 1960/06/16  Today's Date: 08/02/2014 Time: 3729-0211 Time Calculation (min): 34 min  Subjective  Subjective: "as long as it gets me better" Patient and Family Stated Goals: to get this healed up Prior Treatments: s/p I&D 2/23  Pain Score: Pain Score: 2   Wound Assessment  Wound / Incision (Open or Dehisced) 07/26/13 Other (Comment) Back Medial covered (Active)     Wound / Incision (Open or Dehisced) 08/01/14 Other (Comment) Buttocks Left Left buttocks (Active)  Dressing Type Barrier Film (skin prep);Gauze (Comment) 08/02/2014  2:09 PM  Dressing Changed New 08/02/2014  2:09 PM  Dressing Status Clean;Dry;Intact 08/02/2014  2:09 PM  Dressing Change Frequency Twice a day 08/02/2014  2:09 PM  Site / Wound Assessment Granulation tissue;Red;Pink 08/02/2014  2:09 PM  % Wound base Red or Granulating 95% 08/02/2014  2:09 PM  % Wound base Yellow 10% 08/01/2014  3:00 PM  Peri-wound Assessment Induration 08/02/2014  2:09 PM  Wound Length (cm) 3.5 cm 08/01/2014  3:00 PM  Wound Width (cm) 1.5 cm 08/01/2014  3:00 PM  Wound Depth (cm) 2.2 cm 08/01/2014  3:00 PM  Tunneling (cm) 4.0 cm at 6-7:00 08/01/2014  3:00 PM  Undermining (cm) 5.5 cm at 3:00-5:00 08/01/2014  3:00 PM  Margins Unattached edges (unapproximated) 08/02/2014  2:09 PM  Closure None 08/02/2014  2:09 PM  Drainage Amount Moderate 08/02/2014  2:09 PM  Drainage Description Serosanguineous 08/02/2014  2:09 PM  Non-staged Wound Description Not applicable 1/55/2080  2:23 PM  Treatment Cleansed;Debridement (Selective);Hydrotherapy (Pulse lavage) 08/02/2014  2:09 PM     Incision 03/22/12 Abdomen Other (Comment) (Active)     Incision (Closed) 07/27/13 Back Other (Comment) (Active)     Incision (Closed) 07/29/14 Buttocks Left (Active)  Dressing Type Gauze (Comment) 08/02/2014 11:09 AM  Dressing Clean;Dry;Intact 08/02/2014 11:09 AM  Dressing Change  Frequency Twice a day 08/02/2014 11:09 AM  Site / Wound Assessment Dressing in place / Unable to assess 07/31/2014  9:41 PM  Margins Unattached edges (unapproximated) 08/01/2014  8:01 PM  Drainage Amount Moderate 08/01/2014  8:01 PM  Drainage Description Serosanguineous 08/01/2014  8:01 PM  Treatment Other (Comment) 07/31/2014  7:00 AM   Hydrotherapy Pulsed lavage therapy - wound location: left buttocks Pulsed Lavage with Suction (psi): 4 psi (-8 psi) Pulsed Lavage with Suction - Normal Saline Used: 1000 mL Pulsed Lavage Tip: Tunneling tip Selective Debridement Selective Debridement - Location: left buttocks  Selective Debridement - Tools Used: Scissors;Forceps (Q tip) Selective Debridement - Tissue Removed: Fibrin, slough.   Wound Assessment and Plan  Wound Therapy - Assess/Plan/Recommendations Wound Therapy - Clinical Statement: Pt would benefit from PLS to irrigate and cleanse tunneling/undermining portions of wound bed to allow for selective debridement, to decrease bacterial load and promote wound healing. Additional underming noted in distal wound on left buttock- 3.4 cm at 3:00 after debriding fibrin on surface.  Factors Delaying/Impairing Wound Healing: Infection - systemic/local;Diabetes Mellitus Hydrotherapy Plan: Debridement;Dressing change;Patient/family education;Pulsatile lavage with suction Wound Therapy - Frequency: 6X / week Wound Therapy - Current Recommendations: Surgery consult;PT;WOC nurse Wound Therapy - Follow Up Recommendations: Wound Care Center;Home health RN Wound Plan: see above  Wound Therapy Goals- Improve the function of patient's integumentary system by progressing the wound(s) through the phases of wound healing (inflammation - proliferation - remodeling) by: Decrease Necrotic Tissue to: <5% Decrease Necrotic Tissue - Progress: Progressing toward goal Increase Granulation Tissue to: >95% Increase Granulation Tissue -  Progress: Progressing toward  goal Improve Drainage Characteristics: Min Improve Drainage Characteristics - Progress: Not progressing Goals/treatment plan/discharge plan were made with and agreed upon by patient/family: Yes Time For Goal Achievement: 6 days Wound Therapy - Potential for Goals: Good  Goals will be updated until maximal potential achieved or discharge criteria met.  Discharge criteria: when goals achieved, discharge from hospital, MD decision/surgical intervention, no progress towards goals, refusal/missing three consecutive treatments without notification or medical reason.  GP     Candy Sledge A 08/02/2014, 2:12 PM Candy Sledge, [place], DPT [phone]

## 2014-08-02 NOTE — Progress Notes (Signed)
Patient discharged home with instructions. 

## 2014-08-02 NOTE — Discharge Instructions (Signed)

## 2014-08-03 LAB — ANAEROBIC CULTURE

## 2014-08-04 ENCOUNTER — Emergency Department (HOSPITAL_COMMUNITY)
Admission: EM | Admit: 2014-08-04 | Discharge: 2014-08-04 | Disposition: A | Discharge status: Home / Self Care | Payer: Medicare Other | Attending: Emergency Medicine | Admitting: Emergency Medicine

## 2014-08-04 ENCOUNTER — Encounter (HOSPITAL_COMMUNITY): Payer: Self-pay | Admitting: Emergency Medicine

## 2014-08-04 DIAGNOSIS — N182 Chronic kidney disease, stage 2 (mild): Secondary | ICD-10-CM | POA: Insufficient documentation

## 2014-08-04 DIAGNOSIS — Z9889 Other specified postprocedural states: Secondary | ICD-10-CM | POA: Insufficient documentation

## 2014-08-04 DIAGNOSIS — Z09 Encounter for follow-up examination after completed treatment for conditions other than malignant neoplasm: Secondary | ICD-10-CM

## 2014-08-04 DIAGNOSIS — J45909 Unspecified asthma, uncomplicated: Secondary | ICD-10-CM | POA: Diagnosis not present

## 2014-08-04 DIAGNOSIS — Z794 Long term (current) use of insulin: Secondary | ICD-10-CM | POA: Diagnosis not present

## 2014-08-04 DIAGNOSIS — Z8669 Personal history of other diseases of the nervous system and sense organs: Secondary | ICD-10-CM | POA: Diagnosis not present

## 2014-08-04 DIAGNOSIS — E785 Hyperlipidemia, unspecified: Secondary | ICD-10-CM | POA: Insufficient documentation

## 2014-08-04 DIAGNOSIS — Z7982 Long term (current) use of aspirin: Secondary | ICD-10-CM | POA: Insufficient documentation

## 2014-08-04 DIAGNOSIS — Z79899 Other long term (current) drug therapy: Secondary | ICD-10-CM | POA: Diagnosis not present

## 2014-08-04 DIAGNOSIS — I5030 Unspecified diastolic (congestive) heart failure: Secondary | ICD-10-CM | POA: Diagnosis not present

## 2014-08-04 DIAGNOSIS — Z950 Presence of cardiac pacemaker: Secondary | ICD-10-CM | POA: Diagnosis not present

## 2014-08-04 DIAGNOSIS — Z8701 Personal history of pneumonia (recurrent): Secondary | ICD-10-CM | POA: Insufficient documentation

## 2014-08-04 DIAGNOSIS — Z4801 Encounter for change or removal of surgical wound dressing: Secondary | ICD-10-CM | POA: Insufficient documentation

## 2014-08-04 DIAGNOSIS — E119 Type 2 diabetes mellitus without complications: Secondary | ICD-10-CM | POA: Diagnosis not present

## 2014-08-04 DIAGNOSIS — Z792 Long term (current) use of antibiotics: Secondary | ICD-10-CM | POA: Insufficient documentation

## 2014-08-04 DIAGNOSIS — I129 Hypertensive chronic kidney disease with stage 1 through stage 4 chronic kidney disease, or unspecified chronic kidney disease: Secondary | ICD-10-CM | POA: Insufficient documentation

## 2014-08-04 LAB — CBG MONITORING, ED
Glucose-Capillary: 44 mg/dL — CL (ref 70–99)
Glucose-Capillary: 43 mg/dL — CL (ref 70–99)
Glucose-Capillary: 127 mg/dL — ABNORMAL HIGH (ref 70–99)

## 2014-08-04 LAB — CULTURE, BLOOD (ROUTINE X 2)
Culture: NO GROWTH
Culture: NO GROWTH

## 2014-08-04 NOTE — ED Notes (Signed)
Pt reports rectal abscess ongoing since Monday. Pt seen here at that time and had surgery done. Pt was suppose to get home care but was told since no one is at his house they were not going to come out and visit him to show him how to care for it.

## 2014-08-04 NOTE — Discharge Instructions (Signed)

## 2014-08-04 NOTE — ED Notes (Signed)
CBG 127 

## 2014-08-04 NOTE — ED Notes (Signed)
Pt. Left with all belongings and refused wheelchair 

## 2014-08-04 NOTE — ED Provider Notes (Signed)
CSN: [id number]     Arrival date & time 08/04/14  1352 History   First MD Initiated Contact with Patient 08/04/14 1700     Chief Complaint  Patient presents with  . Abscess     (Consider location/radiation/quality/duration/timing/severity/associated sxs/prior Treatment) Patient is a 54 y.o. male presenting with abscess. The history is provided by the patient. No language interpreter was used.  Abscess [name] is a diabetic with a history of perirectal abscess which he was hopsitalized and had surgery for 4 days ago. After being discharged on Friday he says he had a bowel movement which caused the packing to come out of the wound. He denies fever, chills, rectal bleeding, or drainage from the wound.  Past Medical History  Diagnosis Date  . Second degree Mobitz II AV block     with syncope, s/p PPM  . Hypertension   . Bifascicular block   . Pacemaker     02/12/11  . Lymphedema of lower extremity LEFT  >  RIGHT    "using ankle high socks at home; pump doesn't work for me" (07/23/2014)  . Short of breath on exertion   . History of cellulitis of skin with lymphangitis LEFT LEG  . Diastolic heart failure secondary to hypertrophic cardiomyopathy CARDIOLOGIST-- DR Daneen Schick  . Seasonal asthma NO INHALERS  . Edema 07/2013  . CHF (congestive heart failure) 2010  . Chronic bronchitis     "seasonal; get it q yr"  . Gout, arthritis     bil feet. right worse  . Pneumonia 08/2011  . Fabry's disease RENAL AND CARDIAC INVOVLEMENT    FOLLOWED DR [place]ADOANTO  . Chronic kidney disease (CKD), stage II (mild)     stage II to III/notes 07/23/2014  . Type II diabetes mellitus     INSULIN DEPENDENT  . Bell's palsy    Past Surgical History  Procedure Laterality Date  . Cardiac catheterization  03-12-2011  DR Daneen Schick    HYPERTROPHIC CARDIOMYOPATHY WITH LV CAVITY  APPEARANCE CONSISTANT WITH SIGNIFICANT APICAL HYPERTROPHY/ NORMAL LVSF / EF 55%/ EVIDENCE OF DIASTOLIC DYSFUNCTION WITH EDP OF  23-82mmHg AFTER A-WAVE/ NORMAL CORONARY ARTERIES  . Umbilical hernia repair  03/22/2012    Procedure: HERNIA REPAIR UMBILICAL ADULT;  Surgeon: Leighton Ruff, MD;  Location: [facility];  Service: General;  Laterality: N/A;  Umbilical Hernia Repair   . Hernia repair  [id number]    Umbilical Hernia Repair  . Irrigation and debridement abscess N/A 07/27/2013    Procedure: IRRIGATION AND DEBRIDEMENT OF SKIN, SOFT TISSUE AND MUSCLES OF UPPER BACK (11X19X4cm) WITH 10 BLADE AND PULSATILE LAVAGE ;  Surgeon: Gayland Curry, MD;  Location: [place];  Service: General;  Laterality: N/A;  . Insert / replace / remove pacemaker  02/12/2011    SJM implanted by Dr Rayann Heman for Mobitz II second degree AV block and syncope  . Incision and drainage perirectal abscess Left 07/29/2014    Procedure: IRRIGATION AND DEBRIDEMENT PERIRECTAL ABSCESS;  Surgeon: Doreen Salvage, MD;  Location: [place];  Service: General;  Laterality: Left;  Prone position   Family History  Problem Relation Age of Onset  . Colon cancer Neg Hx    History  Substance Use Topics  . Smoking status: Never Smoker   . Smokeless tobacco: Never Used  . Alcohol Use: Yes     Comment: 07/23/2014 "might have a few drinks on holidays or at cookouts"    Review of Systems  Constitutional: Negative for chills.  All other systems  reviewed and are negative.     Allergies  Shellfish allergy  Home Medications   Prior to Admission medications   Medication Sig Start Date End Date Taking? Authorizing Provider  ALBUTEROL SULFATE HFA IN Inhale 1 puff into the lungs daily as needed (shortness of breath or wheezing).    Yes Historical Provider, MD  allopurinol (ZYLOPRIM) 300 MG tablet Take 300 mg by mouth daily.   Yes Historical Provider, MD  amoxicillin-clavulanate (AUGMENTIN) 875-125 MG per tablet Take 1 tablet by mouth 2 (two) times daily. 08/02/14  Yes Robbie Lis, MD  aspirin 81 MG tablet Take 81 mg by mouth daily.    Yes Historical Provider, MD  colchicine 0.6 MG tablet  Take 0.6 mg by mouth daily.   Yes Historical Provider, MD  eplerenone (INSPRA) 50 MG tablet Take 50 mg by mouth daily.   Yes Historical Provider, MD  insulin NPH-regular Human (NOVOLIN 70/30) (70-30) 100 UNIT/ML injection Inject 20 Units into the skin 2 (two) times daily with a meal. Take if Levimir is not approved 08/02/14  Yes Robbie Lis, MD  Insulin Syringe-Needle U-100 29G X 1/2" 0.3 ML MISC Dispense syringes and needles as needed- any brand is OK 07/24/14  Yes Debbe Odea, MD  losartan-hydrochlorothiazide (HYZAAR) 50-12.5 MG per tablet Take 1 tablet by mouth daily. 07/18/14  Yes Historical Provider, MD  Pseudoeph-Doxylamine-DM-APAP (NYQUIL PO) Take 1 Dose by mouth as needed (cold and cough symptoms).   Yes Historical Provider, MD  vitamin C (VITAMIN C) 500 MG tablet Take 1 tablet (500 mg total) by mouth 2 (two) times daily. 08/02/14  Yes Robbie Lis, MD  acetaminophen (TYLENOL) 500 MG tablet Only taking 1 hour before infusion.  AS premed for Fabrazyme    Historical Provider, MD  Agalsidase beta (FABRAZYME IV) Inject into the vein every 14 (fourteen) days.     Historical Provider, MD  fexofenadine (ALLEGRA) 180 MG tablet Take 180 mg by mouth every 14 (fourteen) days.     Historical Provider, MD  glyBURIDE (DIABETA) 5 MG tablet Take 5 mg by mouth daily. 06/30/14   Historical Provider, MD  oxyCODONE-acetaminophen (PERCOCET/ROXICET) 5-325 MG per tablet Take 1-2 tablets by mouth every 4 (four) hours as needed for moderate pain. Patient not taking: Reported on 08/04/2014 08/02/14   Robbie Lis, MD  saccharomyces boulardii (FLORASTOR) 250 MG capsule Take 1 capsule (250 mg total) by mouth 2 (two) times daily. Patient not taking: Reported on 08/04/2014 08/02/14   Robbie Lis, MD  senna (SENOKOT) 8.6 MG TABS tablet Take 2 tablets (17.2 mg total) by mouth at bedtime as needed for mild constipation. Patient not taking: Reported on 08/04/2014 07/23/14   [name], MD   BP 128/76 mmHg  Pulse 72   Temp(Src) 97.8 F (36.6 C) (Oral)  Resp 20  Ht 5\' 10"  (1.778 m)  Wt 268 lb (121.564 kg)  BMI 38.45 kg/m2  SpO2 97% Physical Exam  Constitutional: He is oriented to person, place, and time. He appears well-developed and well-nourished.  HENT:  Head: Normocephalic and atraumatic.  Eyes: Conjunctivae are normal.  Neck: Normal range of motion. Neck supple.  Cardiovascular: Normal rate, regular rhythm and normal heart sounds.   Pulmonary/Chest: Effort normal and breath sounds normal.  Abdominal: Soft. There is no tenderness.  Musculoskeletal: Normal range of motion.  Neurological: He is alert and oriented to person, place, and time.  Skin: Skin is warm and dry.  There is no erythema or purulent or foul  smelling drainage from the abscess.  Nursing note and vitals reviewed.   ED Course  Procedures (including critical care time) Labs Review Labs Reviewed  CBG MONITORING, ED - Abnormal; Notable for the following:    Glucose-Capillary 44 (*)    All other components within normal limits  CBG MONITORING, ED - Abnormal; Notable for the following:    Glucose-Capillary 43 (*)    All other components within normal limits  CBG MONITORING, ED - Abnormal; Notable for the following:    Glucose-Capillary 127 (*)    All other components within normal limits    Imaging Review No results found.   EKG Interpretation None      MDM   Final diagnoses:  Encounter for recheck of abscess following incision and drainage   I repacked the wound with 1/2 inch packing strip and the patient tolerated the procedure well.  He was hypoglycemic on arrival but after eating it is within normal range.    Ottie Glazier, PA-C 08/05/14 1516  Johnna Acosta, MD 08/05/14 2055

## 2014-08-04 NOTE — ED Provider Notes (Signed)
The patient is a diabetic male 54 year old who presents for a wound check after having  packing placed after surgical treatment of a perirectal abscess on his buttock. He states that the packing male with a bowel movement,  he has minimal pain, no fever, normal appetite, has no other complaints. On exam the patient has absent packing, large open wound without foul-smelling drainage, redness or surrounding induration. I assisted in packing the wound, the patient is stable for discharge to follow up. He did have hypoglycemia which was treated with oral intake successfully, he had no symptoms with his low blood sugar. He was rechecked in a normal range.    Medical screening examination/treatment/procedure(s) were conducted as a shared visit with non-physician practitioner(s) and myself.  I personally evaluated the patient during the encounter.  Clinical Impression:   Final diagnoses:  Encounter for recheck of abscess following incision and drainage         Johnna Acosta, MD 08/05/14 2055

## 2014-08-05 DIAGNOSIS — K611 Rectal abscess: Secondary | ICD-10-CM | POA: Diagnosis not present

## 2014-08-08 ENCOUNTER — Emergency Department (HOSPITAL_COMMUNITY)
Admission: EM | Admit: 2014-08-08 | Discharge: 2014-08-08 | Disposition: A | Discharge status: Home / Self Care | Payer: Medicare Other | Attending: Emergency Medicine | Admitting: Emergency Medicine

## 2014-08-08 ENCOUNTER — Encounter (HOSPITAL_COMMUNITY): Payer: Self-pay | Admitting: *Deleted

## 2014-08-08 DIAGNOSIS — Z7982 Long term (current) use of aspirin: Secondary | ICD-10-CM | POA: Insufficient documentation

## 2014-08-08 DIAGNOSIS — Z5189 Encounter for other specified aftercare: Secondary | ICD-10-CM

## 2014-08-08 DIAGNOSIS — N182 Chronic kidney disease, stage 2 (mild): Secondary | ICD-10-CM | POA: Insufficient documentation

## 2014-08-08 DIAGNOSIS — Z950 Presence of cardiac pacemaker: Secondary | ICD-10-CM | POA: Insufficient documentation

## 2014-08-08 DIAGNOSIS — I129 Hypertensive chronic kidney disease with stage 1 through stage 4 chronic kidney disease, or unspecified chronic kidney disease: Secondary | ICD-10-CM | POA: Insufficient documentation

## 2014-08-08 DIAGNOSIS — Z794 Long term (current) use of insulin: Secondary | ICD-10-CM | POA: Diagnosis not present

## 2014-08-08 DIAGNOSIS — I5030 Unspecified diastolic (congestive) heart failure: Secondary | ICD-10-CM | POA: Insufficient documentation

## 2014-08-08 DIAGNOSIS — Z872 Personal history of diseases of the skin and subcutaneous tissue: Secondary | ICD-10-CM | POA: Diagnosis not present

## 2014-08-08 DIAGNOSIS — Z8669 Personal history of other diseases of the nervous system and sense organs: Secondary | ICD-10-CM | POA: Insufficient documentation

## 2014-08-08 DIAGNOSIS — Z4801 Encounter for change or removal of surgical wound dressing: Secondary | ICD-10-CM | POA: Insufficient documentation

## 2014-08-08 DIAGNOSIS — M109 Gout, unspecified: Secondary | ICD-10-CM | POA: Diagnosis not present

## 2014-08-08 DIAGNOSIS — M1990 Unspecified osteoarthritis, unspecified site: Secondary | ICD-10-CM | POA: Diagnosis not present

## 2014-08-08 DIAGNOSIS — E119 Type 2 diabetes mellitus without complications: Secondary | ICD-10-CM | POA: Insufficient documentation

## 2014-08-08 DIAGNOSIS — Z79899 Other long term (current) drug therapy: Secondary | ICD-10-CM | POA: Diagnosis not present

## 2014-08-08 DIAGNOSIS — Z9889 Other specified postprocedural states: Secondary | ICD-10-CM | POA: Insufficient documentation

## 2014-08-08 DIAGNOSIS — Z792 Long term (current) use of antibiotics: Secondary | ICD-10-CM | POA: Insufficient documentation

## 2014-08-08 DIAGNOSIS — J45909 Unspecified asthma, uncomplicated: Secondary | ICD-10-CM | POA: Diagnosis not present

## 2014-08-08 NOTE — Discharge Instructions (Signed)
Return to the emergency room with worsening of symptoms, new symptoms or with symptoms that are concerning, especially fevers, nausea, vomiting, redness, pus like drainage, swelling and increased pain. Please take all of your antibiotics until finished!   You may develop abdominal discomfort or diarrhea from the antibiotic.  You may help offset this with probiotics which you can buy or get in yogurt. Do not eat  or take the probiotics until 2 hours after your antibiotic.  Follow-up with wound care as scheduled above. Call your surgeon's office, Dr. Doreen Salvage for follow-up. Read below information and follow recommendations. Peri-Rectal Abscess Your caregiver has diagnosed you as having a peri-rectal abscess. This is an infected area near the rectum that is filled with pus. If the abscess is near the surface of the skin, your caregiver may open (incise) the area and drain the pus. HOME CARE INSTRUCTIONS   If your abscess was opened up and drained. A small piece of gauze may be placed in the opening so that it can drain. Do not remove the gauze unless directed by your caregiver.  A loose dressing may be placed over the abscess site. Change the dressing as often as necessary to keep it clean and dry.  After the drain is removed, the area may be washed with a gentle antiseptic (soap) four times per day.  A warm sitz bath, warm packs or heating pad may be used for pain relief, taking care not to burn yourself.  Return for a wound check in 1 day or as directed.  An "inflatable doughnut" may be used for sitting with added comfort. These can be purchased at a drugstore or medical supply house.  To reduce pain and straining with bowel movements, eat a high fiber diet with plenty of fruits and vegetables. Use stool softeners as recommended by your caregiver. This is especially important if narcotic type pain medications were prescribed as these may cause marked constipation.  Only take over-the-counter  or prescription medicines for pain, discomfort, or fever as directed by your caregiver. SEEK IMMEDIATE MEDICAL CARE IF:   You have increasing pain that is not controlled by medication.  There is increased inflammation (redness), swelling, bleeding, or drainage from the area.  An oral temperature above 102 F (38.9 C) develops.  You develop chills or generalized malaise (feel lethargic or feel "washed out").  You develop any new symptoms (problems) you feel may be related to your present problem. Document Released: 05/21/2000 Document Revised: 08/16/2011 Document Reviewed: 05/21/2008 [facility] Patient Information 2015 [place], [place]. This information is not intended to replace advice given to you by your health care provider. Make sure you discuss any questions you have with your health care provider.

## 2014-08-08 NOTE — ED Notes (Signed)
NAD noted. Pt given info and discharge instructions for follow up. Pt ambulatory on discharge.

## 2014-08-08 NOTE — Progress Notes (Signed)
Patient in need of wound care follow-up. Refereed to wound care department with appointment on August 26, 2014 at 8:00 am. This was the first available  appointment that was given to pt.   Virgie Dad Emmabelle Fear, BSW-Intern

## 2014-08-08 NOTE — ED Provider Notes (Signed)
CSN: [id number]     Arrival date & time 08/08/14  1153 History   First MD Initiated Contact with Patient 08/08/14 1223     Chief Complaint  Patient presents with  . Wound Check     (Consider location/radiation/quality/duration/timing/severity/associated sxs/prior Treatment) HPI  [name] is a 54 y.o. male with PMH of diabetes, heart failure, chronic kidney disease presenting with wound recheck for perirectal abscess performed Dr. Doreen Salvage 07/29/2014. Patient denies any fevers, chills, nausea, vomiting. He denies any purulent drainage. Last time his wound was checked was 2 days ago here in the emergency room. He was supposed to get home health but they would not see him because he is ambulatory and can drive.   Past Medical History  Diagnosis Date  . Second degree Mobitz II AV block     with syncope, s/p PPM  . Hypertension   . Bifascicular block   . Pacemaker     02/12/11  . Lymphedema of lower extremity LEFT  >  RIGHT    "using ankle high socks at home; pump doesn't work for me" (07/23/2014)  . Short of breath on exertion   . History of cellulitis of skin with lymphangitis LEFT LEG  . Diastolic heart failure secondary to hypertrophic cardiomyopathy CARDIOLOGIST-- DR Daneen Schick  . Seasonal asthma NO INHALERS  . Edema 07/2013  . CHF (congestive heart failure) 2010  . Chronic bronchitis     "seasonal; get it q yr"  . Gout, arthritis     bil feet. right worse  . Pneumonia 08/2011  . Fabry's disease RENAL AND CARDIAC INVOVLEMENT    FOLLOWED DR COLADOANTO  . Chronic kidney disease (CKD), stage II (mild)     stage II to III/notes 07/23/2014  . Type II diabetes mellitus     INSULIN DEPENDENT  . Bell's palsy    Past Surgical History  Procedure Laterality Date  . Cardiac catheterization  03-12-2011  DR Daneen Schick    HYPERTROPHIC CARDIOMYOPATHY WITH LV CAVITY  APPEARANCE CONSISTANT WITH SIGNIFICANT APICAL HYPERTROPHY/ NORMAL LVSF / EF 55%/ EVIDENCE OF DIASTOLIC DYSFUNCTION  WITH EDP OF 23-55mmHg AFTER A-WAVE/ NORMAL CORONARY ARTERIES  . Umbilical hernia repair  03/22/2012    Procedure: HERNIA REPAIR UMBILICAL ADULT;  Surgeon: Leighton Ruff, MD;  Location: [facility];  Service: General;  Laterality: N/A;  Umbilical Hernia Repair   . Hernia repair  [id number]    Umbilical Hernia Repair  . Irrigation and debridement abscess N/A 07/27/2013    Procedure: IRRIGATION AND DEBRIDEMENT OF SKIN, SOFT TISSUE AND MUSCLES OF UPPER BACK (11X19X4cm) WITH 10 BLADE AND PULSATILE LAVAGE ;  Surgeon: Gayland Curry, MD;  Location: [place];  Service: General;  Laterality: N/A;  . Insert / replace / remove pacemaker  02/12/2011    SJM implanted by Dr Rayann Heman for Mobitz II second degree AV block and syncope  . Incision and drainage perirectal abscess Left 07/29/2014    Procedure: IRRIGATION AND DEBRIDEMENT PERIRECTAL ABSCESS;  Surgeon: Doreen Salvage, MD;  Location: [place];  Service: General;  Laterality: Left;  Prone position   Family History  Problem Relation Age of Onset  . Colon cancer Neg Hx    History  Substance Use Topics  . Smoking status: Never Smoker   . Smokeless tobacco: Never Used  . Alcohol Use: Yes     Comment: 07/23/2014 "might have a few drinks on holidays or at cookouts"    Review of Systems  Constitutional: Negative for fever and chills.  Gastrointestinal: Negative for nausea and vomiting.  Skin: Positive for wound. Negative for color change.      Allergies  Shellfish allergy  Home Medications   Prior to Admission medications   Medication Sig Start Date End Date Taking? Authorizing Provider  acetaminophen (TYLENOL) 500 MG tablet Only taking 1 hour before infusion.  AS premed for Fabrazyme   Yes Historical Provider, MD  Agalsidase beta (FABRAZYME IV) Inject into the vein every 14 (fourteen) days.    Yes Historical Provider, MD  ALBUTEROL SULFATE HFA IN Inhale 1 puff into the lungs daily as needed (shortness of breath or wheezing).    Yes Historical Provider, MD  allopurinol  (ZYLOPRIM) 300 MG tablet Take 300 mg by mouth daily.   Yes Historical Provider, MD  amoxicillin-clavulanate (AUGMENTIN) 875-125 MG per tablet Take 1 tablet by mouth 2 (two) times daily. 08/02/14  Yes Robbie Lis, MD  aspirin 81 MG tablet Take 81 mg by mouth daily.    Yes Historical Provider, MD  colchicine 0.6 MG tablet Take 0.6 mg by mouth daily.   Yes Historical Provider, MD  eplerenone (INSPRA) 50 MG tablet Take 50 mg by mouth daily.   Yes Historical Provider, MD  fexofenadine (ALLEGRA) 180 MG tablet Take 180 mg by mouth every 14 (fourteen) days.    Yes Historical Provider, MD  glyBURIDE (DIABETA) 5 MG tablet Take 5 mg by mouth daily. 06/30/14  Yes Historical Provider, MD  insulin NPH-regular Human (NOVOLIN 70/30) (70-30) 100 UNIT/ML injection Inject 20 Units into the skin 2 (two) times daily with a meal. Take if Levimir is not approved 08/02/14  Yes Robbie Lis, MD  losartan-hydrochlorothiazide (HYZAAR) 50-12.5 MG per tablet Take 1 tablet by mouth daily. 07/18/14  Yes Historical Provider, MD  vitamin C (VITAMIN C) 500 MG tablet Take 1 tablet (500 mg total) by mouth 2 (two) times daily. 08/02/14  Yes Robbie Lis, MD  oxyCODONE-acetaminophen (PERCOCET/ROXICET) 5-325 MG per tablet Take 1-2 tablets by mouth every 4 (four) hours as needed for moderate pain. Patient not taking: Reported on 08/04/2014 08/02/14   Robbie Lis, MD  saccharomyces boulardii (FLORASTOR) 250 MG capsule Take 1 capsule (250 mg total) by mouth 2 (two) times daily. Patient not taking: Reported on 08/04/2014 08/02/14   Robbie Lis, MD  senna (SENOKOT) 8.6 MG TABS tablet Take 2 tablets (17.2 mg total) by mouth at bedtime as needed for mild constipation. Patient not taking: Reported on 08/04/2014 07/23/14   [name], MD   BP 118/72 mmHg  Pulse 70  Temp(Src) 97.9 F (36.6 C) (Oral)  Resp 22  Ht 5\' 10"  (1.778 m)  Wt 268 lb (121.564 kg)  BMI 38.45 kg/m2  SpO2 100% Physical Exam  Constitutional: He appears well-developed  and well-nourished. No distress.  HENT:  Head: Normocephalic and atraumatic.  Eyes: Conjunctivae are normal. Right eye exhibits no discharge. Left eye exhibits no discharge.  Pulmonary/Chest: Effort normal. No respiratory distress.  Neurological: He is alert. Coordination normal.  Skin: He is not diaphoretic.  Well appearing left buttocks wound without purulent drainage. No surrounding significant surrounding erythema. No induration or red streaks.   Psychiatric: He has a normal mood and affect. His behavior is normal.  Nursing note and vitals reviewed.   ED Course  Procedures (including critical care time) Labs Review Labs Reviewed - No data to display  Imaging Review No results found.   EKG Interpretation None      MDM   Final diagnoses:  Encounter for wound care  Encounter for wound re-check   Pt presenting for wound recheck and wound care. Pt unable to get home health. VSS. Well appearing healing wound without purulence. Wound irrigated and new packing placed. Consult Case management for patient to get wound care. March 21 follow up at the wound clinic. In the mean time pt to follow up with the wellness center on Monday, in 4 days for would recheck and dressing changes until patient can be established at the wound clinic. Patient is afebrile, nontoxic, and in no acute distress. Patient is appropriate for outpatient management and is stable for discharge.  Discussed return precautions with patient. Discussed all results and patient verbalizes understanding and agrees with plan.   Pura Spice, PA-C 08/08/14 2041  Maudry Diego, MD 08/09/14 [phone]

## 2014-08-08 NOTE — ED Notes (Signed)
EDP at bedside  

## 2014-08-08 NOTE — ED Notes (Signed)
Pt in stating that he had a perirectal abscess removed and has been coming here for follow up and wound rechecks, requesting a dressing change

## 2014-08-09 ENCOUNTER — Other Ambulatory Visit (HOSPITAL_COMMUNITY): Payer: Self-pay | Admitting: Nephrology

## 2014-08-09 ENCOUNTER — Encounter (HOSPITAL_COMMUNITY): Payer: Self-pay

## 2014-08-09 ENCOUNTER — Encounter (HOSPITAL_COMMUNITY)
Admission: RE | Admit: 2014-08-09 | Discharge: 2014-08-09 | Disposition: A | Discharge status: Home / Self Care | Payer: Medicare Other | Source: Ambulatory Visit | Attending: Nephrology | Admitting: Nephrology

## 2014-08-09 DIAGNOSIS — E1101 Type 2 diabetes mellitus with hyperosmolarity with coma: Secondary | ICD-10-CM | POA: Diagnosis not present

## 2014-08-09 DIAGNOSIS — E7521 Fabry (-Anderson) disease: Secondary | ICD-10-CM | POA: Diagnosis not present

## 2014-08-09 MED ORDER — SODIUM CHLORIDE 0.9 % IV SOLN
INTRAVENOUS | Status: DC
  Administered 2014-08-09: 08:00:00 via INTRAVENOUS

## 2014-08-09 MED ORDER — SODIUM CHLORIDE 0.9 % IV SOLN
115.0000 mg | INTRAVENOUS | Status: DC
  Administered 2014-08-09: 115 mg via INTRAVENOUS
  Filled 2014-08-09: qty 23

## 2014-08-12 ENCOUNTER — Ambulatory Visit: Payer: Medicare Other | Attending: Internal Medicine | Admitting: Family Medicine

## 2014-08-12 VITALS — BP 132/80 | HR 77 | Temp 98.7°F | Resp 16 | Ht 70.0 in | Wt 259.0 lb

## 2014-08-12 DIAGNOSIS — Z48817 Encounter for surgical aftercare following surgery on the skin and subcutaneous tissue: Secondary | ICD-10-CM | POA: Insufficient documentation

## 2014-08-12 DIAGNOSIS — Z5189 Encounter for other specified aftercare: Secondary | ICD-10-CM | POA: Diagnosis not present

## 2014-08-12 DIAGNOSIS — K611 Rectal abscess: Secondary | ICD-10-CM | POA: Insufficient documentation

## 2014-08-12 DIAGNOSIS — T148 Other injury of unspecified body region: Secondary | ICD-10-CM | POA: Diagnosis not present

## 2014-08-12 NOTE — Assessment & Plan Note (Signed)
Objectively: The would is open with some drainage on the current dressing. There is no evidence for ongoing infection  Plan:  Would was cleaned with wound cleaner and a new dressing applied.  I did not pack as it is open and drainage and a packing will not stay in place.

## 2014-08-12 NOTE — Progress Notes (Signed)
[name] is a 54 y.o. male with PMH of diabetes, heart failure, chronic kidney disease presenting with wound recheck for perirectal abscess performed Dr. Doreen Salvage 07/29/2014. Patient denies any fevers, chills, nausea, vomiting. He denies any purulent drainage. Last time his wound was checked was THursday in the emergency room. He was supposed to get home health but they would not see him because he is ambulatory and can drive.  Patient here for wound care

## 2014-08-12 NOTE — Patient Instructions (Signed)
Keep clean and dry and return in 3 days for dressing change.

## 2014-08-14 NOTE — Progress Notes (Addendum)
Patient presents for wound care post surgical for a perirectal abscess. Surgery was performed on February 22.  He is set up to see [facility] on March 17th.He needs dressing changes prior to that   Objectively there is a 2 X 3/4 inch open cavity near the rectum on the right inner cheek. There is no current drinage but the dressing is soiled.  Plan: I  Have cleaned the area and reapplied a dry dressing.  I did not pack the wound as it is in no danger of closing and a packing will not stay in place.

## 2014-08-15 ENCOUNTER — Encounter: Payer: Self-pay | Admitting: Physician Assistant

## 2014-08-15 ENCOUNTER — Ambulatory Visit: Payer: Medicare Other | Attending: Family Medicine | Admitting: Physician Assistant

## 2014-08-15 VITALS — BP 132/80 | HR 78 | Temp 98.0°F | Resp 16

## 2014-08-15 DIAGNOSIS — K611 Rectal abscess: Secondary | ICD-10-CM | POA: Diagnosis not present

## 2014-08-15 NOTE — Progress Notes (Signed)
Patient here for wound check and possible dressing change

## 2014-08-15 NOTE — Patient Instructions (Signed)
Return Monday for dressing change Keep appt with diabetes team on 3/17 Keep appt with wound clinic for 3/21

## 2014-08-15 NOTE — Progress Notes (Signed)
Chief Complaint: Wound check  Subjective: This is a 54 year old male who was hospitalized February 22 2 February 26. At that time he was diagnosed with a perirectal abscess by CT scanning. General surgery saw him and he underwent I and D on 07/30/2014. He was discharged home on Augmentin. He is completing his course of antibiotics. He was unable to get a home health nurse to come out to assist him with wound changes secondary to him being ambulatory. He is being seen for the third time today for dressing change only.  ROS:  GEN: denies fever or chills, denies change in weight    Objective:  Filed Vitals:   08/15/14 1138  BP: 132/80  Pulse: 78  Temp: 98 F (36.7 C)  Resp: 16  SpO2: 98%    Physical Exam: General: in no acute distress. SKIN:Skin: 2 x 0.75" open area in the perirectal space. No signs of drainage. Healing. No evidence of infection.  Medications: Prior to Admission medications   Medication Sig Start Date End Date Taking? Authorizing Provider  acetaminophen (TYLENOL) 500 MG tablet Only taking 1 hour before infusion.  AS premed for Fabrazyme    Historical Provider, MD  Agalsidase beta (FABRAZYME IV) Inject into the vein every 14 (fourteen) days.     Historical Provider, MD  ALBUTEROL SULFATE HFA IN Inhale 1 puff into the lungs daily as needed (shortness of breath or wheezing).     Historical Provider, MD  allopurinol (ZYLOPRIM) 300 MG tablet Take 300 mg by mouth daily.    Historical Provider, MD  amoxicillin-clavulanate (AUGMENTIN) 875-125 MG per tablet Take 1 tablet by mouth 2 (two) times daily. 08/02/14   Robbie Lis, MD  aspirin 81 MG tablet Take 81 mg by mouth daily.     Historical Provider, MD  colchicine 0.6 MG tablet Take 0.6 mg by mouth daily.    Historical Provider, MD  eplerenone (INSPRA) 50 MG tablet Take 50 mg by mouth daily.    Historical Provider, MD  fexofenadine (ALLEGRA) 180 MG tablet Take 180 mg by mouth every 14 (fourteen) days.     Historical  Provider, MD  glyBURIDE (DIABETA) 5 MG tablet Take 5 mg by mouth daily. 06/30/14   Historical Provider, MD  insulin NPH-regular Human (NOVOLIN 70/30) (70-30) 100 UNIT/ML injection Inject 20 Units into the skin 2 (two) times daily with a meal. Take if Levimir is not approved 08/02/14   Robbie Lis, MD  losartan-hydrochlorothiazide (HYZAAR) 50-12.5 MG per tablet Take 1 tablet by mouth daily. 07/18/14   Historical Provider, MD  saccharomyces boulardii (FLORASTOR) 250 MG capsule Take 1 capsule (250 mg total) by mouth 2 (two) times daily. 08/02/14   Robbie Lis, MD  senna (SENOKOT) 8.6 MG TABS tablet Take 2 tablets (17.2 mg total) by mouth at bedtime as needed for mild constipation. Patient not taking: Reported on 08/04/2014 07/23/14   Flonnie Overman Dhungel, MD  vitamin C (VITAMIN C) 500 MG tablet Take 1 tablet (500 mg total) by mouth 2 (two) times daily. 08/02/14   Robbie Lis, MD    Assessment: 1. Perirectal abscess status post I&D on 07/30/2014 by general surgery  Plan: Return here on Monday for a dressing change with nurse only  Keep appointment on 321 with wound care Continue all current medications as prescribed  Follow [id number]   The patient was given clear instructions to go to ER or return to medical center if symptoms don't improve, worsen or new problems develop. The patient verbalized  understanding. The patient was told to call to get lab results if they haven't heard anything in the next week.   This note has been created with Surveyor, quantity. Any transcriptional errors are unintentional.   Zettie Pho, PA-C 08/15/2014, 11:53 AM

## 2014-08-19 ENCOUNTER — Ambulatory Visit: Payer: Medicare Other | Attending: Internal Medicine | Admitting: Emergency Medicine

## 2014-08-19 NOTE — Progress Notes (Unsigned)
Patient presents for dressing change.  Wound clean, dry and healing well.  Wet to dry dressing applied and patient appointment made for this Friday, 08/23/14 at 1400 to change dressing.  Patient has wound clinic appointment on 3/21 for further care

## 2014-08-22 ENCOUNTER — Encounter: Payer: Medicare Other | Attending: Family Medicine | Admitting: *Deleted

## 2014-08-22 VITALS — Ht 70.0 in | Wt 254.3 lb

## 2014-08-22 DIAGNOSIS — Z713 Dietary counseling and surveillance: Secondary | ICD-10-CM | POA: Diagnosis not present

## 2014-08-22 DIAGNOSIS — E1165 Type 2 diabetes mellitus with hyperglycemia: Secondary | ICD-10-CM | POA: Diagnosis not present

## 2014-08-22 DIAGNOSIS — Z794 Long term (current) use of insulin: Secondary | ICD-10-CM | POA: Insufficient documentation

## 2014-08-22 DIAGNOSIS — IMO0002 Reserved for concepts with insufficient information to code with codable children: Secondary | ICD-10-CM

## 2014-08-22 NOTE — Patient Instructions (Signed)
Plan:  Aim for 4 Carb Choices per meal (60 grams) +/- 1 either way  Aim for 0-2 Carbs per snack if hungry  Include protein in moderation with your meals and snacks Consider reading food labels for Total Carbohydrate of foods Continue  increasing your activity level daily as tolerated Continue checking BG at alternate times per day as directed by MD  Consider taking medication as directed by MD

## 2014-08-23 ENCOUNTER — Ambulatory Visit: Payer: Medicare Other | Attending: Family Medicine

## 2014-08-23 ENCOUNTER — Encounter (HOSPITAL_COMMUNITY): Payer: Self-pay

## 2014-08-23 ENCOUNTER — Encounter (HOSPITAL_COMMUNITY)
Admission: RE | Admit: 2014-08-23 | Discharge: 2014-08-23 | Disposition: A | Discharge status: Home / Self Care | Payer: Medicare Other | Source: Ambulatory Visit | Attending: Nephrology | Admitting: Nephrology

## 2014-08-23 DIAGNOSIS — E7521 Fabry (-Anderson) disease: Secondary | ICD-10-CM | POA: Diagnosis not present

## 2014-08-23 DIAGNOSIS — E1101 Type 2 diabetes mellitus with hyperosmolarity with coma: Secondary | ICD-10-CM | POA: Diagnosis not present

## 2014-08-23 LAB — PHOSPHORUS: Phosphorus: 4.3 mg/dL (ref 2.3–4.6)

## 2014-08-23 LAB — COMPREHENSIVE METABOLIC PANEL
ALT: 23 U/L (ref 0–53)
AST: 28 U/L (ref 0–37)
Albumin: 3.3 g/dL — ABNORMAL LOW (ref 3.5–5.2)
Alkaline Phosphatase: 76 U/L (ref 39–117)
Anion gap: 6 (ref 5–15)
BUN: 23 mg/dL (ref 6–23)
CO2: 23 mmol/L (ref 19–32)
Calcium: 9 mg/dL (ref 8.4–10.5)
Chloride: 110 mmol/L (ref 96–112)
Creatinine, Ser: 1.27 mg/dL (ref 0.50–1.35)
GFR calc Af Amer: 73 mL/min — ABNORMAL LOW (ref >90)
GFR calc non Af Amer: 63 mL/min — ABNORMAL LOW (ref >90)
Glucose, Bld: 121 mg/dL — ABNORMAL HIGH (ref 70–99)
Potassium: 4.1 mmol/L (ref 3.5–5.1)
Sodium: 139 mmol/L (ref 135–145)
Total Bilirubin: 0.4 mg/dL (ref 0.3–1.2)
Total Protein: 8.1 g/dL (ref 6.0–8.3)

## 2014-08-23 LAB — URINALYSIS, ROUTINE W REFLEX MICROSCOPIC
Bilirubin Urine: NEGATIVE
Glucose, UA: NEGATIVE mg/dL
Hgb urine dipstick: NEGATIVE
Ketones, ur: NEGATIVE mg/dL
Leukocytes, UA: NEGATIVE
Nitrite: NEGATIVE
Protein, ur: 30 mg/dL — AB
Specific Gravity, Urine: 1.011 (ref 1.005–1.030)
Urobilinogen, UA: 0.2 mg/dL (ref 0.0–1.0)
pH: 5.5 (ref 5.0–8.0)

## 2014-08-23 LAB — URINE MICROSCOPIC-ADD ON

## 2014-08-23 MED ORDER — SODIUM CHLORIDE 0.9 % IV SOLN
INTRAVENOUS | Status: DC
  Administered 2014-08-23: 250 mL via INTRAVENOUS

## 2014-08-23 MED ORDER — AGALSIDASE BETA 5 MG IV SOLR
115.0000 mg | INTRAVENOUS | Status: DC
Start: 2014-08-23 — End: 2014-08-24
  Administered 2014-08-23: 115 mg via INTRAVENOUS
  Filled 2014-08-23: qty 23

## 2014-08-23 NOTE — Progress Notes (Unsigned)
Patient here for dressing change.  He has wound clinic appointment on Monday 3/21 at 0900.  Wound is clean dry and intact and appears to be healing nicely.  Wound measures 1.5cmx1.0cm and is 1.5 cm deep.  Wet to dry dressing applied and patient is knowledgeable about how to change dressings at home this weekend.

## 2014-08-23 NOTE — Progress Notes (Signed)
Pt arrived today for his FABRAZYME infusion. Monthly labs were drawn as ordered but since pt had eaten breakfast we discussed at his next infusion (09/06/14) he is to be fasting so we can draw his ordered LIPID and he will bring breakfast to eat after his lab drawn. Pt states "my blood sugar is 200 today and i have eaten breakfast and I took come tylenol and allegra. My perirectal abscess is healing and I have been to the [facility] for help with dressings"

## 2014-08-26 ENCOUNTER — Encounter (HOSPITAL_BASED_OUTPATIENT_CLINIC_OR_DEPARTMENT_OTHER): Payer: Medicare Other | Attending: Plastic Surgery

## 2014-08-26 ENCOUNTER — Ambulatory Visit (INDEPENDENT_AMBULATORY_CARE_PROVIDER_SITE_OTHER): Payer: Medicare Other | Admitting: *Deleted

## 2014-08-26 DIAGNOSIS — I441 Atrioventricular block, second degree: Secondary | ICD-10-CM | POA: Diagnosis not present

## 2014-08-26 DIAGNOSIS — N189 Chronic kidney disease, unspecified: Secondary | ICD-10-CM | POA: Insufficient documentation

## 2014-08-26 DIAGNOSIS — L98412 Non-pressure chronic ulcer of buttock with fat layer exposed: Secondary | ICD-10-CM | POA: Diagnosis not present

## 2014-08-26 DIAGNOSIS — I10 Essential (primary) hypertension: Secondary | ICD-10-CM | POA: Insufficient documentation

## 2014-08-26 DIAGNOSIS — I519 Heart disease, unspecified: Secondary | ICD-10-CM | POA: Diagnosis not present

## 2014-08-26 DIAGNOSIS — M109 Gout, unspecified: Secondary | ICD-10-CM | POA: Diagnosis not present

## 2014-08-26 DIAGNOSIS — E11621 Type 2 diabetes mellitus with foot ulcer: Secondary | ICD-10-CM | POA: Insufficient documentation

## 2014-08-26 DIAGNOSIS — E7521 Fabry (-Anderson) disease: Secondary | ICD-10-CM | POA: Diagnosis not present

## 2014-08-26 NOTE — Progress Notes (Signed)
Remote pacemaker transmission.   

## 2014-08-30 ENCOUNTER — Ambulatory Visit: Payer: Medicare Other | Attending: Family Medicine | Admitting: Family Medicine

## 2014-08-30 ENCOUNTER — Encounter: Payer: Self-pay | Admitting: Family Medicine

## 2014-08-30 VITALS — BP 117/79 | HR 72 | Temp 98.0°F | Resp 16 | Ht 70.0 in | Wt 257.0 lb

## 2014-08-30 DIAGNOSIS — I10 Essential (primary) hypertension: Secondary | ICD-10-CM | POA: Diagnosis not present

## 2014-08-30 DIAGNOSIS — E119 Type 2 diabetes mellitus without complications: Secondary | ICD-10-CM | POA: Diagnosis not present

## 2014-08-30 DIAGNOSIS — L0231 Cutaneous abscess of buttock: Secondary | ICD-10-CM

## 2014-08-30 DIAGNOSIS — K611 Rectal abscess: Secondary | ICD-10-CM | POA: Diagnosis not present

## 2014-08-30 DIAGNOSIS — IMO0002 Reserved for concepts with insufficient information to code with codable children: Secondary | ICD-10-CM

## 2014-08-30 DIAGNOSIS — E1165 Type 2 diabetes mellitus with hyperglycemia: Secondary | ICD-10-CM | POA: Diagnosis not present

## 2014-08-30 LAB — MDC_IDC_ENUM_SESS_TYPE_REMOTE
Battery Remaining Longevity: 61 mo
Battery Remaining Percentage: 59 %
Battery Voltage: 2.92 V
Brady Statistic AP VP Percent: 85 %
Brady Statistic AP VS Percent: 1 %
Brady Statistic AS VP Percent: 15 %
Brady Statistic AS VS Percent: 1 %
Brady Statistic RA Percent Paced: 84 %
Brady Statistic RV Percent Paced: 99 %
Date Time Interrogation Session: 20160321140815
Implantable Pulse Generator Model: 2210
Implantable Pulse Generator Serial Number: 7267950
Lead Channel Impedance Value: 350 Ohm
Lead Channel Impedance Value: 450 Ohm
Lead Channel Pacing Threshold Amplitude: 0.625 V
Lead Channel Pacing Threshold Amplitude: 1.375 V
Lead Channel Pacing Threshold Pulse Width: 0.5 ms
Lead Channel Pacing Threshold Pulse Width: 0.5 ms
Lead Channel Sensing Intrinsic Amplitude: 5 mV
Lead Channel Sensing Intrinsic Amplitude: 5.8 mV
Lead Channel Setting Pacing Amplitude: 1.625
Lead Channel Setting Pacing Amplitude: 1.625
Lead Channel Setting Pacing Pulse Width: 0.5 ms
Lead Channel Setting Sensing Sensitivity: 2 mV

## 2014-08-30 LAB — GLUCOSE, POCT (MANUAL RESULT ENTRY): POC Glucose: 115 mg/dl — AB (ref 70–99)

## 2014-08-30 LAB — POCT GLYCOSYLATED HEMOGLOBIN (HGB A1C): Hemoglobin A1C: 9.8

## 2014-08-30 MED ORDER — INSULIN ASPART PROT & ASPART (70-30 MIX) 100 UNIT/ML ~~LOC~~ SUSP: SUBCUTANEOUS | Status: DC

## 2014-08-30 NOTE — Progress Notes (Signed)
   Subjective:    [name], male    DOB: 12-05-1960, 54 y.o.   MRN: [id number] CC; DM2 f/u, perirectal abscess f/u   HPI  1. CHRONIC DIABETES  Disease Monitoring  Blood Sugar Ranges: 44-272  Polyuria: no   Visual problems: no   Medication Compliance: intermittently   Medication Side Effects  Hypoglycemia: yes 3 episodes of low sugars   Preventitive Health Care  Eye Exam: due   Foot Exam: due   Diet pattern: 2 meals per day   Exercise: does yard work   2. HTN: taking Hyzaar. No CP, cough, SOB. Eating a low salt diet.   3. Perirectal abscess: no pain, minimal drainage. Controlling sugars. Dressing change every other day. Apply collagen.   Soc Hx: non smoker  Review of Systems  Constitutional: Negative for fever.  Respiratory: Negative for shortness of breath.   Cardiovascular: Negative for chest pain.  Gastrointestinal: Negative for blood in stool, anal bleeding and rectal pain.      Objective:   Physical Exam BP 117/79 mmHg  Pulse 72  Temp(Src) 98 F (36.7 C)  Resp 16  Ht 5\' 10"  (1.778 m)  Wt 257 lb (116.574 kg)  BMI 36.88 kg/m2  SpO2 97% General appearance: alert, cooperative and no distress Lungs: normal WOB  Abdomen: obese, soft  Skin: L gluteal wound measures 3 mm deep, 0.7 mm wide x 1.3 mm long. Minimal serous dranaige. Non tender.  Ext: no rash      Assessment & Plan:

## 2014-08-30 NOTE — Assessment & Plan Note (Signed)
Gluteal wound: healing. Continue collagen and wet to dry dressings.

## 2014-08-30 NOTE — Progress Notes (Signed)
Patient here to establish care Patient here to follow up on rectal wound Follow up on DM-patient ran out of novolin he was given 2 vials of novolog in hospital and he thinks that it has been controlling sugars better Patient brought sugar log with him

## 2014-08-30 NOTE — Progress Notes (Signed)
Diabetes Self-Management Education  Visit Type:   Inital  Appt. Start Time: 0900 Appt. End Time: 1030  08/30/2014  Mr. Azahel Rish, identified by name and date of birth, is a 54 y.o. male with a diagnosis of Diabetes: Type 2.  Other people present during visit:  Patient   ASSESSMENT  Height 5\' 10"  (1.778 m), weight 254 lb 4.8 oz (115.35 kg). Body mass index is 36.49 kg/(m^2).  Initial Visit Information:  Are you currently following a meal plan?: Yes What type of meal plan do you follow?: tries to eat healthy from what he reads about Are you taking your medications as prescribed?: Yes Are you checking your feet?: Yes How many days per week are you checking your feet?: 7      Psychosocial:     Patient Belief/Attitude about Diabetes: Motivated to manage diabetes (family history of people who have lost sight and limbs from diabetes) Self-care barriers: None Other persons present: Patient Patient Concerns: Nutrition/Meal planning, Glycemic Control Preferred Learning Style: No preference indicated  Complications:   Last HgB A1C per patient/outside source: 12.3% How often do you check your blood sugar?: 3-4 times/day Fasting Blood glucose range (mg/dL): 180-200, 130-179 Postprandial Blood glucose range (mg/dL): >200 Number of hypoglycemic episodes per month: 0 Have you had a dilated eye exam in the past 12 months?: No Have you had a dental exam in the past 12 months?: No  Diet Intake:  Breakfast: eggs, bacon, 1 toast, 2% milk, 12 oz OJ Snack (morning): no Lunch: left overs OR sandwich with chips, small can of regular or diet soda Snack (afternoon): no Dinner: meat, vegetables and occasionally starch,small can of regular or diet soda Snack (evening): fresh fruit OR cheese and crackers Beverage(s): small can of regular or diet soda, OJ, water  Exercise:  Exercise: Light (walking / raking leaves) (yard work, house work, ) Light Exercise amount of time (min / week):  90  Individualized Plan for Diabetes Self-Management Training:   Learning Objective:  Patient will have a greater understanding of diabetes self-management.  Patient education plan per assessed needs and concerns is to attend individual sessions for     Education Topics Reviewed with Patient Today:  Definition of diabetes, type 1 and 2, and the diagnosis of diabetes Role of diet in the treatment of diabetes and the relationship between the three main macronutrients and blood glucose level, Food label reading, portion sizes and measuring food., Carbohydrate counting Role of exercise on diabetes management, blood pressure control and cardiac health. Reviewed patients medication for diabetes, action, purpose, timing of dose and side effects. Purpose and frequency of SMBG., Identified appropriate SMBG and/or A1C goals. Taught treatment of hypoglycemia - the 15 rule. Relationship between chronic complications and blood glucose control Role of stress on diabetes      PATIENTS GOALS/Plan (Developed by the patient):  Nutrition: Follow meal plan discussed Physical Activity: 15 minutes per day Medications: take my medication as prescribed Monitoring : test blood glucose pre and post meals as discussed  Plan:   Patient Instructions  Plan:  Aim for 4 Carb Choices per meal (60 grams) +/- 1 either way  Aim for 0-2 Carbs per snack if hungry  Include protein in moderation with your meals and snacks Consider reading food labels for Total Carbohydrate of foods Continue  increasing your activity level daily as tolerated Continue checking BG at alternate times per day as directed by MD  Consider taking medication as directed by MD  Expected Outcomes:     Education material provided: Living Well with Diabetes, A1C conversion sheet, Meal plan card and Carbohydrate counting sheet, Insulin Action Sheet  If problems or questions, patient to contact team via:  Phone and Email  Future DSME  appointment: PRN

## 2014-08-30 NOTE — Patient Instructions (Addendum)
Mr. Miedema,  1. DM2: A1c trending down. Great work. Continue novolog 7/30- 20 Units in AM with breakfast, 15 U in PM with dinner Regular exercise Try to lose about 10 #.   Check sugars- fasting, before insulin doses and 2 hrs after breakfast and dinner Diabetes  Goal fasting 100  Goal after eating < 160 Beware of hypoglycemia (low blood sugar) which is blood sugar < 70 with or without symptoms Beware of hypoglycemia which is blood sugar less than 70 with or without symptoms.  The common symptoms of hypoglycemia are: sweating, pale or dusty skin, excessive fatigue, nausea, jitteriness. If you experience these symptoms please check your blood sugar.  My blood sugar is low  (less than 70). What should I do?  If low 60- 70, with or without symptoms. Do not take insulin or oral medication,  eat or drink carbohydrates right away (juice, sweets, breads, fruit). Recheck blood sugar in 2 hrs. If still low call your doctor. If normal take medication.   If 60-40 without symptoms.  Same as above and call your doctor.   If 60-40 with symptoms. Same as above. If symptoms resolve within 30 minutes of eating or drinking carbohydrates call your doctor. If symptoms persist call 911.   If less than 40 with or without symptoms. Same as above. Do not wait 30 minutes, instead call 911.    2. Perirectal wound: healing. Continue collagen and wet to dry dressings.   F/u in 2 weeks with RN for gluteal wound check  F/u in 4 weeks with RN for CBG review.   F/u in 3 months with me for diabetes   Dr. Adrian Blackwater

## 2014-08-30 NOTE — Assessment & Plan Note (Signed)
A: BP at goal Med: compliant P: continue current regimen

## 2014-08-30 NOTE — Assessment & Plan Note (Signed)
DM2: uncontrolled. Improving  A1c trending down. Great work. Continue novolog 7/30- 20 Units in AM with breakfast, 15 U in PM with dinner Regular exercise Try to lose about 10 #.

## 2014-08-31 LAB — MICROALBUMIN / CREATININE URINE RATIO
Creatinine, Urine: 72.9 mg/dL
Microalb Creat Ratio: 246.9 mg/g — ABNORMAL HIGH (ref 0.0–30.0)
Microalb, Ur: 18 mg/dL — ABNORMAL HIGH (ref <2.0)

## 2014-09-06 ENCOUNTER — Encounter (HOSPITAL_COMMUNITY): Payer: Self-pay

## 2014-09-06 ENCOUNTER — Encounter (HOSPITAL_COMMUNITY)
Admission: RE | Admit: 2014-09-06 | Discharge: 2014-09-06 | Disposition: A | Discharge status: Home / Self Care | Payer: Medicare Other | Source: Ambulatory Visit | Attending: Nephrology | Admitting: Nephrology

## 2014-09-06 DIAGNOSIS — E1101 Type 2 diabetes mellitus with hyperosmolarity with coma: Secondary | ICD-10-CM | POA: Diagnosis not present

## 2014-09-06 DIAGNOSIS — E7521 Fabry (-Anderson) disease: Secondary | ICD-10-CM | POA: Insufficient documentation

## 2014-09-06 MED ORDER — SODIUM CHLORIDE 0.9 % IV SOLN
115.0000 mg | INTRAVENOUS | Status: DC
  Administered 2014-09-06: 115 mg via INTRAVENOUS
  Filled 2014-09-06: qty 23

## 2014-09-06 MED ORDER — SODIUM CHLORIDE 0.9 % IV SOLN
INTRAVENOUS | Status: DC
Start: 2014-09-06 — End: 2014-09-07
  Administered 2014-09-06: 09:00:00 via INTRAVENOUS

## 2014-09-06 NOTE — Progress Notes (Addendum)
States he took his tylenol and allegra at home prior to coming here for his Fabrazyme treatment. Continues w wound clinic for perirectal abcess, but is still draining Patient also stated CBG at home was 109

## 2014-09-12 ENCOUNTER — Ambulatory Visit: Payer: Medicare Other | Attending: Family Medicine | Admitting: *Deleted

## 2014-09-12 ENCOUNTER — Encounter: Payer: Self-pay | Admitting: Cardiology

## 2014-09-12 VITALS — Temp 98.1°F

## 2014-09-12 DIAGNOSIS — L0231 Cutaneous abscess of buttock: Secondary | ICD-10-CM | POA: Diagnosis not present

## 2014-09-12 NOTE — Progress Notes (Signed)
Patient presents for wound check of left gluteal abscess Patient denies pain, fever, chills, nausea, vomiting States dime size drainage noted on dressing when changing Patient applying dry dressing every other day  T 98.1 oral  Dressing is dry Wound appears closed with slight moisture at right end Patient denies pain or tenderness upon palpation at wound site No erythema or induration noted  Patient will continue to change dressing every other day until no drainage present  Patient has f/u scheduled with PCP in 2 weeks

## 2014-09-17 ENCOUNTER — Encounter: Payer: Self-pay | Admitting: Internal Medicine

## 2014-09-20 ENCOUNTER — Encounter (HOSPITAL_COMMUNITY)
Admission: RE | Admit: 2014-09-20 | Discharge: 2014-09-20 | Disposition: A | Discharge status: Home / Self Care | Payer: Medicare Other | Source: Ambulatory Visit | Attending: Nephrology | Admitting: Nephrology

## 2014-09-20 ENCOUNTER — Encounter (HOSPITAL_COMMUNITY): Payer: Self-pay

## 2014-09-20 DIAGNOSIS — E1101 Type 2 diabetes mellitus with hyperosmolarity with coma: Secondary | ICD-10-CM | POA: Diagnosis not present

## 2014-09-20 DIAGNOSIS — E7521 Fabry (-Anderson) disease: Secondary | ICD-10-CM | POA: Diagnosis not present

## 2014-09-20 LAB — COMPREHENSIVE METABOLIC PANEL
ALT: 26 U/L (ref 0–53)
AST: 33 U/L (ref 0–37)
Albumin: 3.6 g/dL (ref 3.5–5.2)
Alkaline Phosphatase: 62 U/L (ref 39–117)
Anion gap: 10 (ref 5–15)
BUN: 20 mg/dL (ref 6–23)
CO2: 24 mmol/L (ref 19–32)
Calcium: 9 mg/dL (ref 8.4–10.5)
Chloride: 106 mmol/L (ref 96–112)
Creatinine, Ser: 1.18 mg/dL (ref 0.50–1.35)
GFR calc Af Amer: 80 mL/min — ABNORMAL LOW (ref >90)
GFR calc non Af Amer: 69 mL/min — ABNORMAL LOW (ref >90)
Glucose, Bld: 120 mg/dL — ABNORMAL HIGH (ref 70–99)
Potassium: 4.4 mmol/L (ref 3.5–5.1)
Sodium: 140 mmol/L (ref 135–145)
Total Bilirubin: 0.7 mg/dL (ref 0.3–1.2)
Total Protein: 8.3 g/dL (ref 6.0–8.3)

## 2014-09-20 LAB — IRON AND TIBC
Iron: 89 ug/dL (ref 42–165)
Saturation Ratios: 42 % (ref 20–55)
TIBC: 214 ug/dL — ABNORMAL LOW (ref 215–435)
UIBC: 125 ug/dL (ref 125–400)

## 2014-09-20 LAB — URINE MICROSCOPIC-ADD ON

## 2014-09-20 LAB — LIPID PANEL
Cholesterol: 237 mg/dL — ABNORMAL HIGH (ref 0–200)
HDL: 58 mg/dL (ref >39)
LDL Cholesterol: 160 mg/dL — ABNORMAL HIGH (ref 0–99)
Total CHOL/HDL Ratio: 4.1 RATIO
Triglycerides: 96 mg/dL (ref <150)
VLDL: 19 mg/dL (ref 0–40)

## 2014-09-20 LAB — CBC WITH DIFFERENTIAL/PLATELET
Basophils Absolute: 0 10*3/uL (ref 0.0–0.1)
Basophils Relative: 0 % (ref 0–1)
Eosinophils Absolute: 0.3 10*3/uL (ref 0.0–0.7)
Eosinophils Relative: 5 % (ref 0–5)
HCT: 47.1 % (ref 39.0–52.0)
Hemoglobin: 15.4 g/dL (ref 13.0–17.0)
Lymphocytes Relative: 47 % — ABNORMAL HIGH (ref 12–46)
Lymphs Abs: 2.9 10*3/uL (ref 0.7–4.0)
MCH: 30.3 pg (ref 26.0–34.0)
MCHC: 32.7 g/dL (ref 30.0–36.0)
MCV: 92.5 fL (ref 78.0–100.0)
Monocytes Absolute: 0.4 10*3/uL (ref 0.1–1.0)
Monocytes Relative: 7 % (ref 3–12)
Neutro Abs: 2.5 10*3/uL (ref 1.7–7.7)
Neutrophils Relative %: 41 % — ABNORMAL LOW (ref 43–77)
Platelets: 211 10*3/uL (ref 150–400)
RBC: 5.09 MIL/uL (ref 4.22–5.81)
RDW: 13.1 % (ref 11.5–15.5)
WBC: 6.1 10*3/uL (ref 4.0–10.5)

## 2014-09-20 LAB — URINALYSIS, ROUTINE W REFLEX MICROSCOPIC
Bilirubin Urine: NEGATIVE
Glucose, UA: NEGATIVE mg/dL
Hgb urine dipstick: NEGATIVE
Ketones, ur: NEGATIVE mg/dL
Leukocytes, UA: NEGATIVE
Nitrite: NEGATIVE
Protein, ur: 30 mg/dL — AB
Specific Gravity, Urine: 1.011 (ref 1.005–1.030)
Urobilinogen, UA: 0.2 mg/dL (ref 0.0–1.0)
pH: 5.5 (ref 5.0–8.0)

## 2014-09-20 LAB — PHOSPHORUS: Phosphorus: 4.8 mg/dL — ABNORMAL HIGH (ref 2.3–4.6)

## 2014-09-20 LAB — FERRITIN: Ferritin: 216 ng/mL (ref 22–322)

## 2014-09-20 MED ORDER — AGALSIDASE BETA 5 MG IV SOLR
115.0000 mg | INTRAVENOUS | Status: DC
  Administered 2014-09-20: 115 mg via INTRAVENOUS
  Filled 2014-09-20: qty 23

## 2014-09-20 MED ORDER — SODIUM CHLORIDE 0.9 % IV SOLN
INTRAVENOUS | Status: DC
  Administered 2014-09-20: 250 mL via INTRAVENOUS

## 2014-09-20 NOTE — Progress Notes (Addendum)
Pt arrived today for his Fabrazyme infusion. He has been NPO since MN and we are drawing his fasting lipid along with his other scheduled labs and collecting a UA. Pt is now eating his breakfast. He states his CBG at home was 80 this AM and he did not take his AM insulin. He states he is still going to the [facility] for rectal abscess but it is no longer draining.Pt states he also" took some allegra and tylenol from home prior to his infusion of Fabrazyme"

## 2014-09-26 ENCOUNTER — Ambulatory Visit (HOSPITAL_BASED_OUTPATIENT_CLINIC_OR_DEPARTMENT_OTHER): Payer: Medicare Other | Admitting: Family Medicine

## 2014-09-26 ENCOUNTER — Encounter: Payer: Self-pay | Admitting: Family Medicine

## 2014-09-26 ENCOUNTER — Ambulatory Visit: Payer: Medicare Other | Attending: Family Medicine | Admitting: Family Medicine

## 2014-09-26 VITALS — BP 128/83 | HR 98 | Temp 98.1°F | Resp 18 | Ht 70.0 in | Wt 260.0 lb

## 2014-09-26 DIAGNOSIS — I10 Essential (primary) hypertension: Secondary | ICD-10-CM | POA: Insufficient documentation

## 2014-09-26 DIAGNOSIS — E1165 Type 2 diabetes mellitus with hyperglycemia: Secondary | ICD-10-CM | POA: Diagnosis not present

## 2014-09-26 DIAGNOSIS — B351 Tinea unguium: Secondary | ICD-10-CM | POA: Insufficient documentation

## 2014-09-26 DIAGNOSIS — L0231 Cutaneous abscess of buttock: Secondary | ICD-10-CM

## 2014-09-26 DIAGNOSIS — R609 Edema, unspecified: Secondary | ICD-10-CM | POA: Insufficient documentation

## 2014-09-26 DIAGNOSIS — E119 Type 2 diabetes mellitus without complications: Secondary | ICD-10-CM | POA: Insufficient documentation

## 2014-09-26 DIAGNOSIS — R600 Localized edema: Secondary | ICD-10-CM | POA: Diagnosis not present

## 2014-09-26 DIAGNOSIS — IMO0002 Reserved for concepts with insufficient information to code with codable children: Secondary | ICD-10-CM

## 2014-09-26 LAB — GLUCOSE, POCT (MANUAL RESULT ENTRY): POC Glucose: 113 mg/dl — AB (ref 70–99)

## 2014-09-26 MED ORDER — TERBINAFINE HCL 250 MG PO TABS: 250.0000 mg | ORAL_TABLET | Freq: Every day | ORAL | Status: DC

## 2014-09-26 NOTE — Progress Notes (Signed)
F/U wound Pri-rectal

## 2014-09-26 NOTE — Assessment & Plan Note (Signed)
A: abscess has healed. Patient with induration only P: no f/u needed for abscess

## 2014-09-26 NOTE — Assessment & Plan Note (Signed)
A: sugars are well controlled now Meds: complaint P: continue current regimen

## 2014-09-26 NOTE — Progress Notes (Signed)
   Subjective:    [name], male    DOB: 11/01/1960, 54 y.o.   MRN: [id number] CC: f/u L sided gluteal abscess, DM2 HPI  1. L sided gluteal abscess: no pain. No drainage. Some firmness  2. CHRONIC DIABETES  Disease Monitoring  Polyuria: no   Visual problems: no   Medication Compliance: yes  Medication Side Effects  Hypoglycemia: no   Preventitive Health Care  Eye Exam: due   Foot Exam: done today   Diet pattern: low carb   Exercise: minimal   3. HTN: compliant with medications. Has chronic b/l edema since late 90s. Not compliant with low salt diet.   Soc Hx:  Non smoker  Review of Systems  Constitutional: Negative for fever and chills.  Respiratory: Negative for shortness of breath.   Cardiovascular: Negative for chest pain.  Endocrine: Negative for polydipsia, polyphagia and polyuria.  Skin: Negative for rash and wound.       Objective:   Physical Exam BP 128/83 mmHg  Pulse 98  Temp(Src) 98.1 F (36.7 C) (Oral)  Resp 18  Ht 5\' 10"  (1.778 m)  Wt 260 lb (117.935 kg)  BMI 37.31 kg/m2  SpO2 98% General appearance: alert, cooperative and no distress Lungs: clear to auscultation bilaterally Heart: regular rate and rhythm, S1, S2 normal, no murmur, click, rub or gallop Extremities: edema 2+ b/l. thickened toenails with dry cracked skin  Skin: gluteal with L sided induration, no fluctuance, no drainage   Lab Results  Component Value Date   HGBA1C 9.80 08/30/2014   CBG 113    Assessment & Plan:

## 2014-09-26 NOTE — Patient Instructions (Addendum)
[name],  Thank you for coming in today.  1. Diabetes: well controlled  2. HTN: BP well controlled  3. Toenail fungus: lamisil for 12 weeks. F/u in 6 weeks for CMP, lab visit.   4. Chronic leg swelling: Continue eplerenone Low salt diet Good daily to twice daily emollient-eucerin, cerave, aquaphor   5. Gluteal wound-healed. The hardness is scarring called induration and will soften over time.   F/u in 6 weeks for labs-CMP F/u with me in 2 months   Dr. Adrian Blackwater      Low-Sodium Eating Plan Sodium raises blood pressure and causes water to be held in the body. Getting less sodium from food will help lower your blood pressure, reduce any swelling, and protect your heart, liver, and kidneys. We get sodium by adding salt (sodium chloride) to food. Most of our sodium comes from canned, boxed, and frozen foods. Restaurant foods, fast foods, and pizza are also very high in sodium. Even if you take medicine to lower your blood pressure or to reduce fluid in your body, getting less sodium from your food is important. WHAT IS MY PLAN? Most people should limit their sodium intake to 2,300 mg a day. Your health care provider recommends that you limit your sodium intake to __________ a day.  WHAT DO I NEED TO KNOW ABOUT THIS EATING PLAN? For the low-sodium eating plan, you will follow these general guidelines:  Choose foods with a % Daily Value for sodium of less than 5% (as listed on the food label).   Use salt-free seasonings or herbs instead of table salt or sea salt.   Check with your health care provider or pharmacist before using salt substitutes.   Eat fresh foods.  Eat more vegetables and fruits.  Limit canned vegetables. If you do use them, rinse them well to decrease the sodium.   Limit cheese to 1 oz (28 g) per day.   Eat lower-sodium products, often labeled as "lower sodium" or "no salt added."  Avoid foods that contain monosodium glutamate (MSG). MSG is  sometimes added to [place] food and some canned foods.  Check food labels (Nutrition Facts labels) on foods to learn how much sodium is in one serving.  Eat more home-cooked food and less restaurant, buffet, and fast food.  When eating at a restaurant, ask that your food be prepared with less salt or none, if possible.  HOW DO I READ FOOD LABELS FOR SODIUM INFORMATION? The Nutrition Facts label lists the amount of sodium in one serving of the food. If you eat more than one serving, you must multiply the listed amount of sodium by the number of servings. Food labels may also identify foods as:  Sodium free--Less than 5 mg in a serving.  Very low sodium--35 mg or less in a serving.  Low sodium--140 mg or less in a serving.  Light in sodium--50% less sodium in a serving. For example, if a food that usually has 300 mg of sodium is changed to become light in sodium, it will have 150 mg of sodium.  Reduced sodium--25% less sodium in a serving. For example, if a food that usually has 400 mg of sodium is changed to reduced sodium, it will have 300 mg of sodium. WHAT FOODS CAN I EAT? Grains Low-sodium cereals, including oats, puffed wheat and rice, and shredded wheat cereals. Low-sodium crackers. Unsalted rice and pasta. Lower-sodium bread.  Vegetables Frozen or fresh vegetables. Low-sodium or reduced-sodium canned vegetables. Low-sodium or reduced-sodium tomato sauce  and paste. Low-sodium or reduced-sodium tomato and vegetable juices.  Fruits Fresh, frozen, and canned fruit. Fruit juice.  Meat and Other Protein Products Low-sodium canned tuna and salmon. Fresh or frozen meat, poultry, seafood, and fish. Lamb. Unsalted nuts. Dried beans, peas, and lentils without added salt. Unsalted canned beans. Homemade soups without salt. Eggs.  Dairy Milk. Soy milk. Ricotta cheese. Low-sodium or reduced-sodium cheeses. Yogurt.  Condiments Fresh and dried herbs and spices. Salt-free seasonings.  Onion and garlic powders. Low-sodium varieties of mustard and ketchup. Lemon juice.  Fats and Oils Reduced-sodium salad dressings. Unsalted butter.  Other Unsalted popcorn and pretzels.  The items listed above may not be a complete list of recommended foods or beverages. Contact your dietitian for more options. WHAT FOODS ARE NOT RECOMMENDED? Grains Instant hot cereals. Bread stuffing, pancake, and biscuit mixes. Croutons. Seasoned rice or pasta mixes. Noodle soup cups. Boxed or frozen macaroni and cheese. Self-rising flour. Regular salted crackers. Vegetables Regular canned vegetables. Regular canned tomato sauce and paste. Regular tomato and vegetable juices. Frozen vegetables in sauces. Salted french fries. Olives. Angie Fava. Relishes. Sauerkraut. Salsa. Meat and Other Protein Products Salted, canned, smoked, spiced, or pickled meats, seafood, or fish. Bacon, ham, sausage, hot dogs, corned beef, chipped beef, and packaged luncheon meats. Salt pork. Jerky. Pickled herring. Anchovies, regular canned tuna, and sardines. Salted nuts. Dairy Processed cheese and cheese spreads. Cheese curds. Blue cheese and cottage cheese. Buttermilk.  Condiments Onion and garlic salt, seasoned salt, table salt, and sea salt. Canned and packaged gravies. Worcestershire sauce. Tartar sauce. Barbecue sauce. Teriyaki sauce. Soy sauce, including reduced sodium. Steak sauce. Fish sauce. Oyster sauce. Cocktail sauce. Horseradish. Regular ketchup and mustard. Meat flavorings and tenderizers. Bouillon cubes. Hot sauce. Tabasco sauce. Marinades. Taco seasonings. Relishes. Fats and Oils Regular salad dressings. Salted butter. Margarine. Ghee. Bacon fat.  Other Potato and tortilla chips. Corn chips and puffs. Salted popcorn and pretzels. Canned or dried soups. Pizza. Frozen entrees and pot pies.  The items listed above may not be a complete list of foods and beverages to avoid. Contact your dietitian for more  information. Document Released: 11/13/2001 Document Revised: 05/29/2013 Document Reviewed: 03/28/2013 [facility] Patient Information 2015 [place], [place]. This information is not intended to replace advice given to you by your health care provider. Make sure you discuss any questions you have with your health care provider.

## 2014-09-26 NOTE — Assessment & Plan Note (Addendum)
A: chronic, persistent. No venous stasis wounds. Skin is dry P: Continue eplerenone Low salt diet Good daily to twice daily emollient-eucerin, cerave, aquaphor

## 2014-09-27 NOTE — Progress Notes (Signed)
[name], male   DOB: [birth year], 54 y.o.   MRN: [id number] Error

## 2014-09-27 NOTE — Assessment & Plan Note (Signed)
HTN: BP well controlled P: continue current regimen

## 2014-10-04 ENCOUNTER — Encounter (HOSPITAL_COMMUNITY)
Admission: RE | Admit: 2014-10-04 | Discharge: 2014-10-04 | Disposition: A | Discharge status: Home / Self Care | Payer: Medicare Other | Source: Ambulatory Visit | Attending: Nephrology | Admitting: Nephrology

## 2014-10-04 ENCOUNTER — Encounter (HOSPITAL_COMMUNITY): Payer: Self-pay

## 2014-10-04 DIAGNOSIS — E7521 Fabry (-Anderson) disease: Secondary | ICD-10-CM | POA: Diagnosis not present

## 2014-10-04 DIAGNOSIS — E1101 Type 2 diabetes mellitus with hyperosmolarity with coma: Secondary | ICD-10-CM | POA: Diagnosis not present

## 2014-10-04 MED ORDER — SODIUM CHLORIDE 0.9 % IV SOLN
INTRAVENOUS | Status: DC
  Administered 2014-10-04: 250 mL via INTRAVENOUS

## 2014-10-04 MED ORDER — SODIUM CHLORIDE 0.9 % IV SOLN
115.0000 mg | INTRAVENOUS | Status: DC
  Administered 2014-10-04: 115 mg via INTRAVENOUS
  Filled 2014-10-04: qty 23

## 2014-10-04 NOTE — Discharge Instructions (Signed)
ATTENTION:  If you are going to be 15 or more minutes late for your appointment, please call [phone] to make other arrangements for your treatment.  If you arrive early for your schedule appointment, you may have to wait until your scheduled time.Agalsidase Beta injection What is this medicine? AGALSIDASE BETA is used to replace an enzyme that is missing in patients with Fabry disease. It is not a cure. This medicine may be used for other purposes; ask your health care provider or pharmacist if you have questions. COMMON BRAND NAME(S): Fabrazyme What should I tell my health care provider before I take this medicine? They need to know if you have any of these conditions: -heart disease -an unusual or allergic reaction to agalsidase beta, mannitol, other medicines, foods, dyes, or preservatives -pregnant or trying to get pregnant -breast-feeding How should I use this medicine? This medicine is for infusion into a vein. It is given by a health care professional in a hospital or clinic setting. Talk to your pediatrician regarding the use of this medicine in children. Special care may be needed. Overdosage: If you think you have taken too much of this medicine contact a poison control center or emergency room at once. NOTE: This medicine is only for you. Do not share this medicine with others. What if I miss a dose? It is important not to miss your dose. Call your doctor or health care professional if you are unable to keep an appointment. What may interact with this medicine? -amiodarone -chloroquine -gentamicin -hydroxychloroquine -monobenzone This list may not describe all possible interactions. Give your health care provider a list of all the medicines, herbs, non-prescription drugs, or dietary supplements you use. Also tell them if you smoke, drink alcohol, or use illegal drugs. Some items may interact with your medicine. What should I watch for while using this medicine? Visit your  doctor or health care professional for regular checks on your progress. Tell your doctor or healthcare professional if your symptoms do not start to get better or if they get worse. There is a registry for patients with Fabry disease. The registry is used to gather information about the disease and its effects. Talk to your health care provider if you would like to join the registry. What side effects may I notice from receiving this medicine? Side effects that you should report to your doctor or health care professional as soon as possible: -allergic reactions like skin rash, itching or hives, swelling of the face, lips, or tongue -breathing problems -chest pain, tightness -depression -dizziness -fast, irregular heart beat -swelling of the arms or legs Side effects that usually do not require medical attention (report to your doctor or health care professional if they continue or are bothersome): -aches or pains -anxiety -fever or chills at the time of injection -headache -nausea, vomiting -stomach pain, upset This list may not describe all possible side effects. Call your doctor for medical advice about side effects. You may report side effects to FDA at 1-800-FDA-1088. Where should I keep my medicine? This drug is given in a hospital or clinic and will not be stored at home. NOTE: This sheet is a summary. It may not cover all possible information. If you have questions about this medicine, talk to your doctor, pharmacist, or health care provider.  2015, Elsevier/Gold Standard. (2006-01-31 12:25:00)

## 2014-10-18 ENCOUNTER — Encounter (HOSPITAL_COMMUNITY): Payer: Self-pay

## 2014-10-18 ENCOUNTER — Encounter (HOSPITAL_COMMUNITY)
Admission: RE | Admit: 2014-10-18 | Discharge: 2014-10-18 | Disposition: A | Discharge status: Home / Self Care | Payer: Medicare Other | Source: Ambulatory Visit | Attending: Nephrology | Admitting: Nephrology

## 2014-10-18 DIAGNOSIS — E1101 Type 2 diabetes mellitus with hyperosmolarity with coma: Secondary | ICD-10-CM | POA: Diagnosis not present

## 2014-10-18 DIAGNOSIS — E7521 Fabry (-Anderson) disease: Secondary | ICD-10-CM | POA: Insufficient documentation

## 2014-10-18 LAB — URINALYSIS, ROUTINE W REFLEX MICROSCOPIC
Bilirubin Urine: NEGATIVE
Glucose, UA: NEGATIVE mg/dL
Hgb urine dipstick: NEGATIVE
Ketones, ur: NEGATIVE mg/dL
Leukocytes, UA: NEGATIVE
Nitrite: NEGATIVE
Protein, ur: 30 mg/dL — AB
Specific Gravity, Urine: 1.01 (ref 1.005–1.030)
Urobilinogen, UA: 0.2 mg/dL (ref 0.0–1.0)
pH: 5.5 (ref 5.0–8.0)

## 2014-10-18 LAB — COMPREHENSIVE METABOLIC PANEL
ALT: 23 U/L (ref 17–63)
AST: 28 U/L (ref 15–41)
Albumin: 3.4 g/dL — ABNORMAL LOW (ref 3.5–5.0)
Alkaline Phosphatase: 66 U/L (ref 38–126)
Anion gap: 10 (ref 5–15)
BUN: 22 mg/dL — ABNORMAL HIGH (ref 6–20)
CO2: 23 mmol/L (ref 22–32)
Calcium: 8.9 mg/dL (ref 8.9–10.3)
Chloride: 106 mmol/L (ref 101–111)
Creatinine, Ser: 1.35 mg/dL — ABNORMAL HIGH (ref 0.61–1.24)
GFR calc Af Amer: >60 mL/min (ref >60)
GFR calc non Af Amer: 58 mL/min — ABNORMAL LOW (ref >60)
Glucose, Bld: 155 mg/dL — ABNORMAL HIGH (ref 65–99)
Potassium: 4.1 mmol/L (ref 3.5–5.1)
Sodium: 139 mmol/L (ref 135–145)
Total Bilirubin: 0.4 mg/dL (ref 0.3–1.2)
Total Protein: 7.6 g/dL (ref 6.5–8.1)

## 2014-10-18 LAB — PHOSPHORUS: Phosphorus: 3.8 mg/dL (ref 2.5–4.6)

## 2014-10-18 LAB — URINE MICROSCOPIC-ADD ON

## 2014-10-18 MED ORDER — SODIUM CHLORIDE 0.9 % IV SOLN
115.0000 mg | INTRAVENOUS | Status: DC
  Administered 2014-10-18: 115 mg via INTRAVENOUS
  Filled 2014-10-18: qty 23

## 2014-10-18 MED ORDER — SODIUM CHLORIDE 0.9 % IV SOLN
INTRAVENOUS | Status: DC
  Administered 2014-10-18: 250 mL via INTRAVENOUS

## 2014-11-01 ENCOUNTER — Encounter (HOSPITAL_COMMUNITY): Payer: Self-pay

## 2014-11-01 ENCOUNTER — Encounter (HOSPITAL_COMMUNITY)
Admission: RE | Admit: 2014-11-01 | Discharge: 2014-11-01 | Disposition: A | Discharge status: Home / Self Care | Payer: Medicare Other | Source: Ambulatory Visit | Attending: Nephrology | Admitting: Nephrology

## 2014-11-01 DIAGNOSIS — E7521 Fabry (-Anderson) disease: Secondary | ICD-10-CM | POA: Diagnosis not present

## 2014-11-01 DIAGNOSIS — E1101 Type 2 diabetes mellitus with hyperosmolarity with coma: Secondary | ICD-10-CM | POA: Diagnosis not present

## 2014-11-01 MED ORDER — SODIUM CHLORIDE 0.9 % IV SOLN
INTRAVENOUS | Status: AC
  Administered 2014-11-01: 09:00:00 via INTRAVENOUS

## 2014-11-01 MED ORDER — SODIUM CHLORIDE 0.9 % IV SOLN
115.0000 mg | INTRAVENOUS | Status: AC
  Administered 2014-11-01: 115 mg via INTRAVENOUS
  Filled 2014-11-01: qty 23

## 2014-11-01 NOTE — Discharge Instructions (Signed)
Agalsidase Beta injection What is this medicine? AGALSIDASE BETA is used to replace an enzyme that is missing in patients with Fabry disease. It is not a cure. This medicine may be used for other purposes; ask your health care provider or pharmacist if you have questions. COMMON BRAND NAME(S): Fabrazyme What should I tell my health care provider before I take this medicine? They need to know if you have any of these conditions: -heart disease -an unusual or allergic reaction to agalsidase beta, mannitol, other medicines, foods, dyes, or preservatives -pregnant or trying to get pregnant -breast-feeding How should I use this medicine? This medicine is for infusion into a vein. It is given by a health care professional in a hospital or clinic setting. Talk to your pediatrician regarding the use of this medicine in children. Special care may be needed. Overdosage: If you think you have taken too much of this medicine contact a poison control center or emergency room at once. NOTE: This medicine is only for you. Do not share this medicine with others. What if I miss a dose? It is important not to miss your dose. Call your doctor or health care professional if you are unable to keep an appointment. What may interact with this medicine? -amiodarone -chloroquine -gentamicin -hydroxychloroquine -monobenzone This list may not describe all possible interactions. Give your health care provider a list of all the medicines, herbs, non-prescription drugs, or dietary supplements you use. Also tell them if you smoke, drink alcohol, or use illegal drugs. Some items may interact with your medicine. What should I watch for while using this medicine? Visit your doctor or health care professional for regular checks on your progress. Tell your doctor or healthcare professional if your symptoms do not start to get better or if they get worse. There is a registry for patients with Fabry disease. The registry is  used to gather information about the disease and its effects. Talk to your health care provider if you would like to join the registry. What side effects may I notice from receiving this medicine? Side effects that you should report to your doctor or health care professional as soon as possible: -allergic reactions like skin rash, itching or hives, swelling of the face, lips, or tongue -breathing problems -chest pain, tightness -depression -dizziness -fast, irregular heart beat -swelling of the arms or legs Side effects that usually do not require medical attention (report to your doctor or health care professional if they continue or are bothersome): -aches or pains -anxiety -fever or chills at the time of injection -headache -nausea, vomiting -stomach pain, upset This list may not describe all possible side effects. Call your doctor for medical advice about side effects. You may report side effects to FDA at 1-800-FDA-1088. Where should I keep my medicine? This drug is given in a hospital or clinic and will not be stored at home. NOTE: This sheet is a summary. It may not cover all possible information. If you have questions about this medicine, talk to your doctor, pharmacist, or health care provider.  2015, Elsevier/Gold Standard. (2006-01-31 12:25:00)

## 2014-11-07 ENCOUNTER — Ambulatory Visit: Payer: Medicare Other | Attending: Family Medicine

## 2014-11-07 DIAGNOSIS — B351 Tinea unguium: Secondary | ICD-10-CM | POA: Diagnosis not present

## 2014-11-07 LAB — COMPLETE METABOLIC PANEL WITH GFR
ALT: 21 U/L (ref 0–53)
AST: 25 U/L (ref 0–37)
Albumin: 3.5 g/dL (ref 3.5–5.2)
Alkaline Phosphatase: 71 U/L (ref 39–117)
BUN: 19 mg/dL (ref 6–23)
CO2: 28 mEq/L (ref 19–32)
Calcium: 9.2 mg/dL (ref 8.4–10.5)
Chloride: 105 mEq/L (ref 96–112)
Creat: 1.28 mg/dL (ref 0.50–1.35)
GFR, Est African American: 73 mL/min
GFR, Est Non African American: 63 mL/min
Glucose, Bld: 123 mg/dL — ABNORMAL HIGH (ref 70–99)
Potassium: 4.8 mEq/L (ref 3.5–5.3)
Sodium: 142 mEq/L (ref 135–145)
Total Bilirubin: 0.4 mg/dL (ref 0.2–1.2)
Total Protein: 7 g/dL (ref 6.0–8.3)

## 2014-11-14 DIAGNOSIS — E118 Type 2 diabetes mellitus with unspecified complications: Secondary | ICD-10-CM | POA: Diagnosis not present

## 2014-11-14 DIAGNOSIS — N182 Chronic kidney disease, stage 2 (mild): Secondary | ICD-10-CM | POA: Diagnosis not present

## 2014-11-14 DIAGNOSIS — E7521 Fabry (-Anderson) disease: Secondary | ICD-10-CM | POA: Diagnosis not present

## 2014-11-14 DIAGNOSIS — I129 Hypertensive chronic kidney disease with stage 1 through stage 4 chronic kidney disease, or unspecified chronic kidney disease: Secondary | ICD-10-CM | POA: Diagnosis not present

## 2014-11-15 ENCOUNTER — Encounter (HOSPITAL_COMMUNITY)
Admission: RE | Admit: 2014-11-15 | Discharge: 2014-11-15 | Disposition: A | Discharge status: Home / Self Care | Payer: Medicare Other | Source: Ambulatory Visit | Attending: Nephrology | Admitting: Nephrology

## 2014-11-15 ENCOUNTER — Other Ambulatory Visit (HOSPITAL_COMMUNITY): Payer: Self-pay | Admitting: Nephrology

## 2014-11-15 ENCOUNTER — Encounter (HOSPITAL_COMMUNITY): Payer: Self-pay

## 2014-11-15 DIAGNOSIS — E1101 Type 2 diabetes mellitus with hyperosmolarity with coma: Secondary | ICD-10-CM | POA: Insufficient documentation

## 2014-11-15 DIAGNOSIS — E7521 Fabry (-Anderson) disease: Secondary | ICD-10-CM | POA: Diagnosis not present

## 2014-11-15 LAB — COMPREHENSIVE METABOLIC PANEL
ALT: 25 U/L (ref 17–63)
AST: 28 U/L (ref 15–41)
Albumin: 3.5 g/dL (ref 3.5–5.0)
Alkaline Phosphatase: 77 U/L (ref 38–126)
Anion gap: 9 (ref 5–15)
BUN: 17 mg/dL (ref 6–20)
CO2: 24 mmol/L (ref 22–32)
Calcium: 8.8 mg/dL — ABNORMAL LOW (ref 8.9–10.3)
Chloride: 108 mmol/L (ref 101–111)
Creatinine, Ser: 1.18 mg/dL (ref 0.61–1.24)
GFR calc Af Amer: >60 mL/min (ref >60)
GFR calc non Af Amer: >60 mL/min (ref >60)
Glucose, Bld: 142 mg/dL — ABNORMAL HIGH (ref 65–99)
Potassium: 3.8 mmol/L (ref 3.5–5.1)
Sodium: 141 mmol/L (ref 135–145)
Total Bilirubin: 0.4 mg/dL (ref 0.3–1.2)
Total Protein: 7.6 g/dL (ref 6.5–8.1)

## 2014-11-15 LAB — URINALYSIS, ROUTINE W REFLEX MICROSCOPIC
Bilirubin Urine: NEGATIVE
Glucose, UA: NEGATIVE mg/dL
Hgb urine dipstick: NEGATIVE
Ketones, ur: NEGATIVE mg/dL
Leukocytes, UA: NEGATIVE
Nitrite: NEGATIVE
Protein, ur: 100 mg/dL — AB
Specific Gravity, Urine: 1.011 (ref 1.005–1.030)
Urobilinogen, UA: 0.2 mg/dL (ref 0.0–1.0)
pH: 5.5 (ref 5.0–8.0)

## 2014-11-15 LAB — PHOSPHORUS: Phosphorus: 3.7 mg/dL (ref 2.5–4.6)

## 2014-11-15 LAB — URINE MICROSCOPIC-ADD ON

## 2014-11-15 MED ORDER — SODIUM CHLORIDE 0.9 % IV SOLN
INTRAVENOUS | Status: DC
  Administered 2014-11-15: 250 mL via INTRAVENOUS

## 2014-11-15 MED ORDER — SODIUM CHLORIDE 0.9 % IV SOLN
115.0000 mg | INTRAVENOUS | Status: DC
  Administered 2014-11-15: 115 mg via INTRAVENOUS
  Filled 2014-11-15: qty 23

## 2014-11-18 ENCOUNTER — Telehealth: Payer: Self-pay | Admitting: *Deleted

## 2014-11-18 NOTE — Telephone Encounter (Signed)
LVM to return call.

## 2014-11-18 NOTE — Telephone Encounter (Signed)
-----   Message from Boykin Nearing, MD sent at 11/08/2014  8:36 AM EDT ----- Normal CMP, liver function normal

## 2014-11-19 NOTE — Telephone Encounter (Signed)
Pt aware of results 

## 2014-11-19 NOTE — Telephone Encounter (Signed)
Patient called returning nurse's phone call to review results. °

## 2014-11-20 ENCOUNTER — Ambulatory Visit: Payer: Medicare Other | Attending: Family Medicine | Admitting: Family Medicine

## 2014-11-20 ENCOUNTER — Encounter: Payer: Self-pay | Admitting: Family Medicine

## 2014-11-20 VITALS — BP 112/81 | HR 72 | Temp 99.4°F | Resp 16 | Ht 70.0 in | Wt 262.0 lb

## 2014-11-20 DIAGNOSIS — L988 Other specified disorders of the skin and subcutaneous tissue: Secondary | ICD-10-CM | POA: Diagnosis not present

## 2014-11-20 DIAGNOSIS — E119 Type 2 diabetes mellitus without complications: Secondary | ICD-10-CM | POA: Diagnosis not present

## 2014-11-20 LAB — GLUCOSE, POCT (MANUAL RESULT ENTRY): POC Glucose: 141 mg/dl — AB (ref 70–99)

## 2014-11-20 LAB — POCT GLYCOSYLATED HEMOGLOBIN (HGB A1C): Hemoglobin A1C: 6.7

## 2014-11-20 NOTE — Assessment & Plan Note (Signed)
Diabetes: Very well controlled, great job Continue current regimen  Referral made to ophthalmology for diabetic eye exam.

## 2014-11-20 NOTE — Progress Notes (Signed)
   Subjective:    [name], male    DOB: [birth year], 54 y.o.   MRN: [id number] CC: gluteal abscess. Diabetes  HPI 54 yo M with DM2, HTN, presents for f/u  1. R gluteal abscess: He had an I&D of a perirectal abscess done on 07/29/14. He had f/u wound care with healing by secondary intention. Since my last OV,  on 09/28/14 he noticed significant bloody drainage following mowing his lawn. He has not had pain, fever, swelling. He now notices scant drainage that is sometimes heavy when he does increased activity.   2. Diabetes: complaint with insulin regimen. No low CBGS. 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   Soc Hx: non smoker  Review of Systems  Constitutional: Negative for fever and chills.  Skin: Positive for wound.       Objective:   Physical Exam BP 112/81 mmHg  Pulse 72  Temp(Src) 99.4 F (37.4 C) (Oral)  Resp 16  Ht 5\' 10"  (1.778 m)  Wt 262 lb (118.842 kg)  BMI 37.59 kg/m2  SpO2 69% General appearance: alert, cooperative and no distress Extremities: edema 2 + b/l L > R  Skin: L medial gluteal lesion: 5x5 mm circular area of skin opening with scant serosanguineous discharge, there is surrounding induration, there is no fluctuance, skin erythema or dimpling   Lab Results  Component Value Date   HGBA1C 6.70 11/20/2014   CBG 141     Assessment & Plan:

## 2014-11-20 NOTE — Assessment & Plan Note (Signed)
Suspected sinus following R gluteal abscess: General surgery referral No evidence of abscess formation or skin infection

## 2014-11-20 NOTE — Patient Instructions (Signed)
Mr. Fok,  Thank you for coming in today.   1. Suspected sinus following R gluteal abscess: General surgery referral No evidence of abscess formation or skin infection   2. Diabetes: Very well controlled, great job Continue current regimen  Referral made to ophthalmology for diabetic eye exam.   F/u in 3 months for diabetes  Dr. Adrian Blackwater

## 2014-11-20 NOTE — Progress Notes (Signed)
F/U abscess Pt stated gluteal abscess was drain on February Worse this month bleeding, no pain

## 2014-11-27 ENCOUNTER — Encounter: Payer: Medicare Other | Admitting: *Deleted

## 2014-11-27 ENCOUNTER — Telehealth: Payer: Self-pay | Admitting: Cardiology

## 2014-11-27 NOTE — Telephone Encounter (Signed)
LMOVM reminding pt to send remote transmission.   

## 2014-11-28 ENCOUNTER — Encounter: Payer: Self-pay | Admitting: Cardiology

## 2014-11-29 ENCOUNTER — Encounter (HOSPITAL_COMMUNITY)
Admission: RE | Admit: 2014-11-29 | Discharge: 2014-11-29 | Disposition: A | Discharge status: Home / Self Care | Payer: Medicare Other | Source: Ambulatory Visit | Attending: Nephrology | Admitting: Nephrology

## 2014-11-29 ENCOUNTER — Encounter (HOSPITAL_COMMUNITY): Payer: Self-pay

## 2014-11-29 DIAGNOSIS — E1101 Type 2 diabetes mellitus with hyperosmolarity with coma: Secondary | ICD-10-CM | POA: Diagnosis not present

## 2014-11-29 DIAGNOSIS — E7521 Fabry (-Anderson) disease: Secondary | ICD-10-CM | POA: Diagnosis not present

## 2014-11-29 MED ORDER — SODIUM CHLORIDE 0.9 % IV SOLN
INTRAVENOUS | Status: DC
  Administered 2014-11-29: 09:00:00 via INTRAVENOUS

## 2014-11-29 MED ORDER — SODIUM CHLORIDE 0.9 % IV SOLN
115.0000 mg | INTRAVENOUS | Status: DC
  Administered 2014-11-29: 115 mg via INTRAVENOUS
  Filled 2014-11-29: qty 23

## 2014-11-29 NOTE — Discharge Instructions (Signed)
Agalsidase Beta injection What is this medicine? AGALSIDASE BETA is used to replace an enzyme that is missing in patients with Fabry disease. It is not a cure. This medicine may be used for other purposes; ask your health care provider or pharmacist if you have questions. COMMON BRAND NAME(S): Fabrazyme What should I tell my health care provider before I take this medicine? They need to know if you have any of these conditions: -heart disease -an unusual or allergic reaction to agalsidase beta, mannitol, other medicines, foods, dyes, or preservatives -pregnant or trying to get pregnant -breast-feeding How should I use this medicine? This medicine is for infusion into a vein. It is given by a health care professional in a hospital or clinic setting. Talk to your pediatrician regarding the use of this medicine in children. Special care may be needed. Overdosage: If you think you have taken too much of this medicine contact a poison control center or emergency room at once. NOTE: This medicine is only for you. Do not share this medicine with others. What if I miss a dose? It is important not to miss your dose. Call your doctor or health care professional if you are unable to keep an appointment. What may interact with this medicine? -amiodarone -chloroquine -gentamicin -hydroxychloroquine -monobenzone This list may not describe all possible interactions. Give your health care provider a list of all the medicines, herbs, non-prescription drugs, or dietary supplements you use. Also tell them if you smoke, drink alcohol, or use illegal drugs. Some items may interact with your medicine. What should I watch for while using this medicine? Visit your doctor or health care professional for regular checks on your progress. Tell your doctor or healthcare professional if your symptoms do not start to get better or if they get worse. There is a registry for patients with Fabry disease. The registry is  used to gather information about the disease and its effects. Talk to your health care provider if you would like to join the registry. What side effects may I notice from receiving this medicine? Side effects that you should report to your doctor or health care professional as soon as possible: -allergic reactions like skin rash, itching or hives, swelling of the face, lips, or tongue -breathing problems -chest pain, tightness -depression -dizziness -fast, irregular heart beat -swelling of the arms or legs Side effects that usually do not require medical attention (report to your doctor or health care professional if they continue or are bothersome): -aches or pains -anxiety -fever or chills at the time of injection -headache -nausea, vomiting -stomach pain, upset This list may not describe all possible side effects. Call your doctor for medical advice about side effects. You may report side effects to FDA at 1-800-FDA-1088. Where should I keep my medicine? This drug is given in a hospital or clinic and will not be stored at home. NOTE: This sheet is a summary. It may not cover all possible information. If you have questions about this medicine, talk to your doctor, pharmacist, or health care provider.  2015, Elsevier/Gold Standard. (2006-01-31 12:25:00)

## 2014-12-04 ENCOUNTER — Encounter (HOSPITAL_COMMUNITY): Payer: Medicare Other

## 2014-12-11 DIAGNOSIS — E7521 Fabry (-Anderson) disease: Secondary | ICD-10-CM | POA: Diagnosis not present

## 2014-12-11 DIAGNOSIS — E119 Type 2 diabetes mellitus without complications: Secondary | ICD-10-CM | POA: Diagnosis not present

## 2014-12-11 DIAGNOSIS — H2513 Age-related nuclear cataract, bilateral: Secondary | ICD-10-CM | POA: Diagnosis not present

## 2014-12-11 DIAGNOSIS — H35033 Hypertensive retinopathy, bilateral: Secondary | ICD-10-CM | POA: Diagnosis not present

## 2014-12-13 ENCOUNTER — Encounter (HOSPITAL_COMMUNITY)
Admission: RE | Admit: 2014-12-13 | Discharge: 2014-12-13 | Disposition: A | Discharge status: Home / Self Care | Payer: Medicare Other | Source: Ambulatory Visit | Attending: Nephrology | Admitting: Nephrology

## 2014-12-13 ENCOUNTER — Encounter (HOSPITAL_COMMUNITY): Payer: Self-pay

## 2014-12-13 DIAGNOSIS — E7521 Fabry (-Anderson) disease: Secondary | ICD-10-CM | POA: Diagnosis not present

## 2014-12-13 DIAGNOSIS — E1101 Type 2 diabetes mellitus with hyperosmolarity with coma: Secondary | ICD-10-CM | POA: Insufficient documentation

## 2014-12-13 LAB — CBC WITH DIFFERENTIAL/PLATELET
Basophils Absolute: 0 10*3/uL (ref 0.0–0.1)
Basophils Relative: 0 % (ref 0–1)
Eosinophils Absolute: 0.3 10*3/uL (ref 0.0–0.7)
Eosinophils Relative: 4 % (ref 0–5)
HCT: 43.9 % (ref 39.0–52.0)
Hemoglobin: 14.6 g/dL (ref 13.0–17.0)
Lymphocytes Relative: 42 % (ref 12–46)
Lymphs Abs: 2.6 10*3/uL (ref 0.7–4.0)
MCH: 29.5 pg (ref 26.0–34.0)
MCHC: 33.3 g/dL (ref 30.0–36.0)
MCV: 88.7 fL (ref 78.0–100.0)
Monocytes Absolute: 0.6 10*3/uL (ref 0.1–1.0)
Monocytes Relative: 10 % (ref 3–12)
Neutro Abs: 2.8 10*3/uL (ref 1.7–7.7)
Neutrophils Relative %: 44 % (ref 43–77)
Platelets: 211 10*3/uL (ref 150–400)
RBC: 4.95 MIL/uL (ref 4.22–5.81)
RDW: 12.7 % (ref 11.5–15.5)
WBC: 6.3 10*3/uL (ref 4.0–10.5)

## 2014-12-13 LAB — COMPREHENSIVE METABOLIC PANEL
ALT: 21 U/L (ref 17–63)
AST: 30 U/L (ref 15–41)
Albumin: 3.3 g/dL — ABNORMAL LOW (ref 3.5–5.0)
Alkaline Phosphatase: 69 U/L (ref 38–126)
Anion gap: 8 (ref 5–15)
BUN: 16 mg/dL (ref 6–20)
CO2: 23 mmol/L (ref 22–32)
Calcium: 8.7 mg/dL — ABNORMAL LOW (ref 8.9–10.3)
Chloride: 108 mmol/L (ref 101–111)
Creatinine, Ser: 1.22 mg/dL (ref 0.61–1.24)
GFR calc Af Amer: >60 mL/min (ref >60)
GFR calc non Af Amer: >60 mL/min (ref >60)
Glucose, Bld: 159 mg/dL — ABNORMAL HIGH (ref 65–99)
Potassium: 3.9 mmol/L (ref 3.5–5.1)
Sodium: 139 mmol/L (ref 135–145)
Total Bilirubin: 0.5 mg/dL (ref 0.3–1.2)
Total Protein: 7.6 g/dL (ref 6.5–8.1)

## 2014-12-13 LAB — URINALYSIS, ROUTINE W REFLEX MICROSCOPIC
Bilirubin Urine: NEGATIVE
Glucose, UA: NEGATIVE mg/dL
Hgb urine dipstick: NEGATIVE
Ketones, ur: NEGATIVE mg/dL
Leukocytes, UA: NEGATIVE
Nitrite: NEGATIVE
Protein, ur: 100 mg/dL — AB
Specific Gravity, Urine: 1.01 (ref 1.005–1.030)
Urobilinogen, UA: 0.2 mg/dL (ref 0.0–1.0)
pH: 5.5 (ref 5.0–8.0)

## 2014-12-13 LAB — IRON AND TIBC
Iron: 46 ug/dL (ref 45–182)
Saturation Ratios: 17 % — ABNORMAL LOW (ref 17.9–39.5)
TIBC: 276 ug/dL (ref 250–450)
UIBC: 230 ug/dL

## 2014-12-13 LAB — FERRITIN: Ferritin: 74 ng/mL (ref 24–336)

## 2014-12-13 LAB — LIPID PANEL
Cholesterol: 232 mg/dL — ABNORMAL HIGH (ref 0–200)
HDL: 42 mg/dL (ref >40)
LDL Cholesterol: 159 mg/dL — ABNORMAL HIGH (ref 0–99)
Total CHOL/HDL Ratio: 5.5 RATIO
Triglycerides: 157 mg/dL — ABNORMAL HIGH (ref <150)
VLDL: 31 mg/dL (ref 0–40)

## 2014-12-13 LAB — URINE MICROSCOPIC-ADD ON

## 2014-12-13 LAB — PHOSPHORUS: Phosphorus: 4.1 mg/dL (ref 2.5–4.6)

## 2014-12-13 MED ORDER — SODIUM CHLORIDE 0.9 % IV SOLN
INTRAVENOUS | Status: DC
  Administered 2014-12-13: 08:00:00 via INTRAVENOUS

## 2014-12-13 MED ORDER — SODIUM CHLORIDE 0.9 % IV SOLN
115.0000 mg | INTRAVENOUS | Status: DC
  Administered 2014-12-13: 115 mg via INTRAVENOUS
  Filled 2014-12-13: qty 23

## 2014-12-13 NOTE — Discharge Instructions (Signed)
Agalsidase Beta injection What is this medicine? AGALSIDASE BETA is used to replace an enzyme that is missing in patients with Fabry disease. It is not a cure. This medicine may be used for other purposes; ask your health care provider or pharmacist if you have questions. COMMON BRAND NAME(S): Fabrazyme What should I tell my health care provider before I take this medicine? They need to know if you have any of these conditions: -heart disease -an unusual or allergic reaction to agalsidase beta, mannitol, other medicines, foods, dyes, or preservatives -pregnant or trying to get pregnant -breast-feeding How should I use this medicine? This medicine is for infusion into a vein. It is given by a health care professional in a hospital or clinic setting. Talk to your pediatrician regarding the use of this medicine in children. Special care may be needed. Overdosage: If you think you have taken too much of this medicine contact a poison control center or emergency room at once. NOTE: This medicine is only for you. Do not share this medicine with others. What if I miss a dose? It is important not to miss your dose. Call your doctor or health care professional if you are unable to keep an appointment. What may interact with this medicine? -amiodarone -chloroquine -gentamicin -hydroxychloroquine -monobenzone This list may not describe all possible interactions. Give your health care provider a list of all the medicines, herbs, non-prescription drugs, or dietary supplements you use. Also tell them if you smoke, drink alcohol, or use illegal drugs. Some items may interact with your medicine. What should I watch for while using this medicine? Visit your doctor or health care professional for regular checks on your progress. Tell your doctor or healthcare professional if your symptoms do not start to get better or if they get worse. There is a registry for patients with Fabry disease. The registry is  used to gather information about the disease and its effects. Talk to your health care provider if you would like to join the registry. What side effects may I notice from receiving this medicine? Side effects that you should report to your doctor or health care professional as soon as possible: -allergic reactions like skin rash, itching or hives, swelling of the face, lips, or tongue -breathing problems -chest pain, tightness -depression -dizziness -fast, irregular heart beat -swelling of the arms or legs Side effects that usually do not require medical attention (report to your doctor or health care professional if they continue or are bothersome): -aches or pains -anxiety -fever or chills at the time of injection -headache -nausea, vomiting -stomach pain, upset This list may not describe all possible side effects. Call your doctor for medical advice about side effects. You may report side effects to FDA at 1-800-FDA-1088. Where should I keep my medicine? This drug is given in a hospital or clinic and will not be stored at home. NOTE: This sheet is a summary. It may not cover all possible information. If you have questions about this medicine, talk to your doctor, pharmacist, or health care provider.  2015, Elsevier/Gold Standard. (2006-01-31 12:25:00)

## 2014-12-27 ENCOUNTER — Encounter (HOSPITAL_COMMUNITY)
Admission: RE | Admit: 2014-12-27 | Discharge: 2014-12-27 | Disposition: A | Discharge status: Home / Self Care | Payer: Medicare Other | Source: Ambulatory Visit | Attending: Nephrology | Admitting: Nephrology

## 2014-12-27 ENCOUNTER — Encounter (HOSPITAL_COMMUNITY): Payer: Self-pay

## 2014-12-27 DIAGNOSIS — E1101 Type 2 diabetes mellitus with hyperosmolarity with coma: Secondary | ICD-10-CM | POA: Diagnosis not present

## 2014-12-27 DIAGNOSIS — E7521 Fabry (-Anderson) disease: Secondary | ICD-10-CM | POA: Diagnosis not present

## 2014-12-27 MED ORDER — SODIUM CHLORIDE 0.9 % IV SOLN
INTRAVENOUS | Status: DC
  Administered 2014-12-27: 250 mL via INTRAVENOUS

## 2014-12-27 MED ORDER — SODIUM CHLORIDE 0.9 % IV SOLN
115.0000 mg | INTRAVENOUS | Status: DC
  Administered 2014-12-27: 115 mg via INTRAVENOUS
  Filled 2014-12-27: qty 23

## 2015-01-10 ENCOUNTER — Encounter (HOSPITAL_COMMUNITY)
Admission: RE | Admit: 2015-01-10 | Discharge: 2015-01-10 | Disposition: A | Discharge status: Home / Self Care | Payer: Medicare Other | Source: Ambulatory Visit | Attending: Nephrology | Admitting: Nephrology

## 2015-01-10 ENCOUNTER — Encounter (HOSPITAL_COMMUNITY): Payer: Self-pay

## 2015-01-10 DIAGNOSIS — E7521 Fabry (-Anderson) disease: Secondary | ICD-10-CM | POA: Insufficient documentation

## 2015-01-10 DIAGNOSIS — E1101 Type 2 diabetes mellitus with hyperosmolarity with coma: Secondary | ICD-10-CM | POA: Diagnosis not present

## 2015-01-10 LAB — COMPREHENSIVE METABOLIC PANEL
ALT: 28 U/L (ref 17–63)
AST: 39 U/L (ref 15–41)
Albumin: 3.5 g/dL (ref 3.5–5.0)
Alkaline Phosphatase: 70 U/L (ref 38–126)
Anion gap: 10 (ref 5–15)
BUN: 17 mg/dL (ref 6–20)
CO2: 21 mmol/L — ABNORMAL LOW (ref 22–32)
Calcium: 8.9 mg/dL (ref 8.9–10.3)
Chloride: 107 mmol/L (ref 101–111)
Creatinine, Ser: 1.32 mg/dL — ABNORMAL HIGH (ref 0.61–1.24)
GFR calc Af Amer: >60 mL/min (ref >60)
GFR calc non Af Amer: >60 mL/min (ref >60)
Glucose, Bld: 212 mg/dL — ABNORMAL HIGH (ref 65–99)
Potassium: 4.1 mmol/L (ref 3.5–5.1)
Sodium: 138 mmol/L (ref 135–145)
Total Bilirubin: 0.8 mg/dL (ref 0.3–1.2)
Total Protein: 7.5 g/dL (ref 6.5–8.1)

## 2015-01-10 LAB — URINALYSIS W MICROSCOPIC (NOT AT ARMC)
Bilirubin Urine: NEGATIVE
Glucose, UA: NEGATIVE mg/dL
Hgb urine dipstick: NEGATIVE
Ketones, ur: NEGATIVE mg/dL
Leukocytes, UA: NEGATIVE
Nitrite: NEGATIVE
Protein, ur: 100 mg/dL — AB
Specific Gravity, Urine: 1.012 (ref 1.005–1.030)
Urobilinogen, UA: 0.2 mg/dL (ref 0.0–1.0)
pH: 5.5 (ref 5.0–8.0)

## 2015-01-10 LAB — PHOSPHORUS: Phosphorus: 3.9 mg/dL (ref 2.5–4.6)

## 2015-01-10 MED ORDER — SODIUM CHLORIDE 0.9 % IV SOLN
INTRAVENOUS | Status: DC
  Administered 2015-01-10: 10:00:00 via INTRAVENOUS

## 2015-01-10 MED ORDER — SODIUM CHLORIDE 0.9 % IV SOLN
115.0000 mg | INTRAVENOUS | Status: DC
  Administered 2015-01-10: 115 mg via INTRAVENOUS
  Filled 2015-01-10: qty 23

## 2015-01-10 NOTE — Discharge Instructions (Signed)
Agalsidase Beta injection What is this medicine? AGALSIDASE BETA is used to replace an enzyme that is missing in patients with Fabry disease. It is not a cure. This medicine may be used for other purposes; ask your health care provider or pharmacist if you have questions. COMMON BRAND NAME(S): Fabrazyme What should I tell my health care provider before I take this medicine? They need to know if you have any of these conditions: -heart disease -an unusual or allergic reaction to agalsidase beta, mannitol, other medicines, foods, dyes, or preservatives -pregnant or trying to get pregnant -breast-feeding How should I use this medicine? This medicine is for infusion into a vein. It is given by a health care professional in a hospital or clinic setting. Talk to your pediatrician regarding the use of this medicine in children. Special care may be needed. Overdosage: If you think you have taken too much of this medicine contact a poison control center or emergency room at once. NOTE: This medicine is only for you. Do not share this medicine with others. What if I miss a dose? It is important not to miss your dose. Call your doctor or health care professional if you are unable to keep an appointment. What may interact with this medicine? -amiodarone -chloroquine -gentamicin -hydroxychloroquine -monobenzone This list may not describe all possible interactions. Give your health care provider a list of all the medicines, herbs, non-prescription drugs, or dietary supplements you use. Also tell them if you smoke, drink alcohol, or use illegal drugs. Some items may interact with your medicine. What should I watch for while using this medicine? Visit your doctor or health care professional for regular checks on your progress. Tell your doctor or healthcare professional if your symptoms do not start to get better or if they get worse. There is a registry for patients with Fabry disease. The registry is  used to gather information about the disease and its effects. Talk to your health care provider if you would like to join the registry. What side effects may I notice from receiving this medicine? Side effects that you should report to your doctor or health care professional as soon as possible: -allergic reactions like skin rash, itching or hives, swelling of the face, lips, or tongue -breathing problems -chest pain, tightness -depression -dizziness -fast, irregular heart beat -swelling of the arms or legs Side effects that usually do not require medical attention (report to your doctor or health care professional if they continue or are bothersome): -aches or pains -anxiety -fever or chills at the time of injection -headache -nausea, vomiting -stomach pain, upset This list may not describe all possible side effects. Call your doctor for medical advice about side effects. You may report side effects to FDA at 1-800-FDA-1088. Where should I keep my medicine? This drug is given in a hospital or clinic and will not be stored at home. NOTE: This sheet is a summary. It may not cover all possible information. If you have questions about this medicine, talk to your doctor, pharmacist, or health care provider.  2015, Elsevier/Gold Standard. (2006-01-31 12:25:00)

## 2015-01-24 ENCOUNTER — Encounter (HOSPITAL_COMMUNITY)
Admission: RE | Admit: 2015-01-24 | Discharge: 2015-01-24 | Disposition: A | Discharge status: Home / Self Care | Payer: Medicare Other | Source: Ambulatory Visit | Attending: Nephrology | Admitting: Nephrology

## 2015-01-24 DIAGNOSIS — E1101 Type 2 diabetes mellitus with hyperosmolarity with coma: Secondary | ICD-10-CM | POA: Diagnosis not present

## 2015-01-24 DIAGNOSIS — E7521 Fabry (-Anderson) disease: Secondary | ICD-10-CM | POA: Diagnosis not present

## 2015-01-24 MED ORDER — SODIUM CHLORIDE 0.9 % IV SOLN
INTRAVENOUS | Status: DC
  Administered 2015-01-24: 10:00:00 via INTRAVENOUS

## 2015-01-24 MED ORDER — AGALSIDASE BETA 5 MG IV SOLR
115.0000 mg | INTRAVENOUS | Status: DC
  Administered 2015-01-24: 115 mg via INTRAVENOUS
  Filled 2015-01-24: qty 23

## 2015-01-24 NOTE — Progress Notes (Signed)
Patient states he took his tylenol and allegra at home prior to arrival for treatment.  Also said his CBG at home was 150s this am

## 2015-01-24 NOTE — Progress Notes (Addendum)
Noted that patient's left ankle and calf are much more edematous than has ever been. Right ankle is also but not as much as left. He says MDs are aware

## 2015-02-07 ENCOUNTER — Encounter (HOSPITAL_COMMUNITY): Payer: Self-pay

## 2015-02-07 ENCOUNTER — Encounter (HOSPITAL_COMMUNITY)
Admission: RE | Admit: 2015-02-07 | Discharge: 2015-02-07 | Disposition: A | Discharge status: Home / Self Care | Payer: Medicare Other | Source: Ambulatory Visit | Attending: Nephrology | Admitting: Nephrology

## 2015-02-07 DIAGNOSIS — E7521 Fabry (-Anderson) disease: Secondary | ICD-10-CM | POA: Diagnosis not present

## 2015-02-07 DIAGNOSIS — E1101 Type 2 diabetes mellitus with hyperosmolarity with coma: Secondary | ICD-10-CM | POA: Diagnosis not present

## 2015-02-07 LAB — URINALYSIS, ROUTINE W REFLEX MICROSCOPIC
Bilirubin Urine: NEGATIVE
Glucose, UA: NEGATIVE mg/dL
Hgb urine dipstick: NEGATIVE
Ketones, ur: NEGATIVE mg/dL
Leukocytes, UA: NEGATIVE
Nitrite: NEGATIVE
Protein, ur: 100 mg/dL — AB
Specific Gravity, Urine: 1.011 (ref 1.005–1.030)
Urobilinogen, UA: 0.2 mg/dL (ref 0.0–1.0)
pH: 5.5 (ref 5.0–8.0)

## 2015-02-07 LAB — COMPREHENSIVE METABOLIC PANEL
ALT: 22 U/L (ref 17–63)
AST: 28 U/L (ref 15–41)
Albumin: 3.2 g/dL — ABNORMAL LOW (ref 3.5–5.0)
Alkaline Phosphatase: 73 U/L (ref 38–126)
Anion gap: 8 (ref 5–15)
BUN: 19 mg/dL (ref 6–20)
CO2: 24 mmol/L (ref 22–32)
Calcium: 8.8 mg/dL — ABNORMAL LOW (ref 8.9–10.3)
Chloride: 108 mmol/L (ref 101–111)
Creatinine, Ser: 1.15 mg/dL (ref 0.61–1.24)
GFR calc Af Amer: >60 mL/min (ref >60)
GFR calc non Af Amer: >60 mL/min (ref >60)
Glucose, Bld: 182 mg/dL — ABNORMAL HIGH (ref 65–99)
Potassium: 3.9 mmol/L (ref 3.5–5.1)
Sodium: 140 mmol/L (ref 135–145)
Total Bilirubin: 0.5 mg/dL (ref 0.3–1.2)
Total Protein: 7.4 g/dL (ref 6.5–8.1)

## 2015-02-07 LAB — URINE MICROSCOPIC-ADD ON

## 2015-02-07 LAB — PHOSPHORUS: Phosphorus: 3.8 mg/dL (ref 2.5–4.6)

## 2015-02-07 MED ORDER — SODIUM CHLORIDE 0.9 % IV SOLN
INTRAVENOUS | Status: DC
  Administered 2015-02-07: 09:00:00 via INTRAVENOUS

## 2015-02-07 MED ORDER — SODIUM CHLORIDE 0.9 % IV SOLN
115.0000 mg | INTRAVENOUS | Status: DC
  Administered 2015-02-07: 115 mg via INTRAVENOUS
  Filled 2015-02-07: qty 23

## 2015-02-07 NOTE — Progress Notes (Signed)
Pt took his premeds before arriving today.  Pt states he took 500mg  tylenol and 1 allegra at 0800.

## 2015-02-07 NOTE — Discharge Instructions (Signed)
Agalsidase Beta injection What is this medicine? AGALSIDASE BETA is used to replace an enzyme that is missing in patients with Fabry disease. It is not a cure. This medicine may be used for other purposes; ask your health care provider or pharmacist if you have questions. COMMON BRAND NAME(S): Fabrazyme What should I tell my health care provider before I take this medicine? They need to know if you have any of these conditions: -heart disease -an unusual or allergic reaction to agalsidase beta, mannitol, other medicines, foods, dyes, or preservatives -pregnant or trying to get pregnant -breast-feeding How should I use this medicine? This medicine is for infusion into a vein. It is given by a health care professional in a hospital or clinic setting. Talk to your pediatrician regarding the use of this medicine in children. Special care may be needed. Overdosage: If you think you have taken too much of this medicine contact a poison control center or emergency room at once. NOTE: This medicine is only for you. Do not share this medicine with others. What if I miss a dose? It is important not to miss your dose. Call your doctor or health care professional if you are unable to keep an appointment. What may interact with this medicine? -amiodarone -chloroquine -gentamicin -hydroxychloroquine -monobenzone This list may not describe all possible interactions. Give your health care provider a list of all the medicines, herbs, non-prescription drugs, or dietary supplements you use. Also tell them if you smoke, drink alcohol, or use illegal drugs. Some items may interact with your medicine. What should I watch for while using this medicine? Visit your doctor or health care professional for regular checks on your progress. Tell your doctor or healthcare professional if your symptoms do not start to get better or if they get worse. There is a registry for patients with Fabry disease. The registry is  used to gather information about the disease and its effects. Talk to your health care provider if you would like to join the registry. What side effects may I notice from receiving this medicine? Side effects that you should report to your doctor or health care professional as soon as possible: -allergic reactions like skin rash, itching or hives, swelling of the face, lips, or tongue -breathing problems -chest pain, tightness -depression -dizziness -fast, irregular heart beat -swelling of the arms or legs Side effects that usually do not require medical attention (report to your doctor or health care professional if they continue or are bothersome): -aches or pains -anxiety -fever or chills at the time of injection -headache -nausea, vomiting -stomach pain, upset This list may not describe all possible side effects. Call your doctor for medical advice about side effects. You may report side effects to FDA at 1-800-FDA-1088. Where should I keep my medicine? This drug is given in a hospital or clinic and will not be stored at home. NOTE: This sheet is a summary. It may not cover all possible information. If you have questions about this medicine, talk to your doctor, pharmacist, or health care provider.  2015, Elsevier/Gold Standard. (2006-01-31 12:25:00)

## 2015-02-21 ENCOUNTER — Encounter (HOSPITAL_COMMUNITY): Payer: Self-pay

## 2015-02-21 ENCOUNTER — Encounter (HOSPITAL_COMMUNITY)
Admission: RE | Admit: 2015-02-21 | Discharge: 2015-02-21 | Disposition: A | Discharge status: Home / Self Care | Payer: Medicare Other | Source: Ambulatory Visit | Attending: Nephrology | Admitting: Nephrology

## 2015-02-21 DIAGNOSIS — E1101 Type 2 diabetes mellitus with hyperosmolarity with coma: Secondary | ICD-10-CM | POA: Diagnosis not present

## 2015-02-21 DIAGNOSIS — E7521 Fabry (-Anderson) disease: Secondary | ICD-10-CM | POA: Diagnosis not present

## 2015-02-21 MED ORDER — SODIUM CHLORIDE 0.9 % IV SOLN
INTRAVENOUS | Status: DC
  Administered 2015-02-21: 09:00:00 via INTRAVENOUS

## 2015-02-21 MED ORDER — SODIUM CHLORIDE 0.9 % IV SOLN
115.0000 mg | INTRAVENOUS | Status: DC
  Administered 2015-02-21: 115 mg via INTRAVENOUS
  Filled 2015-02-21: qty 21

## 2015-02-21 NOTE — Discharge Instructions (Signed)
Agalsidase Beta injection What is this medicine? AGALSIDASE BETA is used to replace an enzyme that is missing in patients with Fabry disease. It is not a cure. This medicine may be used for other purposes; ask your health care provider or pharmacist if you have questions. COMMON BRAND NAME(S): Fabrazyme What should I tell my health care provider before I take this medicine? They need to know if you have any of these conditions: -heart disease -an unusual or allergic reaction to agalsidase beta, mannitol, other medicines, foods, dyes, or preservatives -pregnant or trying to get pregnant -breast-feeding How should I use this medicine? This medicine is for infusion into a vein. It is given by a health care professional in a hospital or clinic setting. Talk to your pediatrician regarding the use of this medicine in children. Special care may be needed. Overdosage: If you think you have taken too much of this medicine contact a poison control center or emergency room at once. NOTE: This medicine is only for you. Do not share this medicine with others. What if I miss a dose? It is important not to miss your dose. Call your doctor or health care professional if you are unable to keep an appointment. What may interact with this medicine? -amiodarone -chloroquine -gentamicin -hydroxychloroquine -monobenzone This list may not describe all possible interactions. Give your health care provider a list of all the medicines, herbs, non-prescription drugs, or dietary supplements you use. Also tell them if you smoke, drink alcohol, or use illegal drugs. Some items may interact with your medicine. What should I watch for while using this medicine? Visit your doctor or health care professional for regular checks on your progress. Tell your doctor or healthcare professional if your symptoms do not start to get better or if they get worse. There is a registry for patients with Fabry disease. The registry is  used to gather information about the disease and its effects. Talk to your health care provider if you would like to join the registry. What side effects may I notice from receiving this medicine? Side effects that you should report to your doctor or health care professional as soon as possible: -allergic reactions like skin rash, itching or hives, swelling of the face, lips, or tongue -breathing problems -chest pain, tightness -depression -dizziness -fast, irregular heart beat -swelling of the arms or legs Side effects that usually do not require medical attention (report to your doctor or health care professional if they continue or are bothersome): -aches or pains -anxiety -fever or chills at the time of injection -headache -nausea, vomiting -stomach pain, upset This list may not describe all possible side effects. Call your doctor for medical advice about side effects. You may report side effects to FDA at 1-800-FDA-1088. Where should I keep my medicine? This drug is given in a hospital or clinic and will not be stored at home. NOTE: This sheet is a summary. It may not cover all possible information. If you have questions about this medicine, talk to your doctor, pharmacist, or health care provider.  2015, Elsevier/Gold Standard. (2006-01-31 12:25:00)

## 2015-03-04 ENCOUNTER — Encounter (HOSPITAL_COMMUNITY): Payer: Self-pay | Admitting: Emergency Medicine

## 2015-03-04 ENCOUNTER — Emergency Department (HOSPITAL_COMMUNITY)
Admission: EM | Admit: 2015-03-04 | Discharge: 2015-03-04 | Disposition: A | Discharge status: Home / Self Care | Payer: Medicare Other | Attending: Emergency Medicine | Admitting: Emergency Medicine

## 2015-03-04 DIAGNOSIS — Z79899 Other long term (current) drug therapy: Secondary | ICD-10-CM | POA: Diagnosis not present

## 2015-03-04 DIAGNOSIS — I5030 Unspecified diastolic (congestive) heart failure: Secondary | ICD-10-CM | POA: Diagnosis not present

## 2015-03-04 DIAGNOSIS — Z794 Long term (current) use of insulin: Secondary | ICD-10-CM | POA: Insufficient documentation

## 2015-03-04 DIAGNOSIS — M10071 Idiopathic gout, right ankle and foot: Secondary | ICD-10-CM | POA: Diagnosis not present

## 2015-03-04 DIAGNOSIS — Z872 Personal history of diseases of the skin and subcutaneous tissue: Secondary | ICD-10-CM | POA: Diagnosis not present

## 2015-03-04 DIAGNOSIS — R739 Hyperglycemia, unspecified: Secondary | ICD-10-CM | POA: Diagnosis not present

## 2015-03-04 DIAGNOSIS — I129 Hypertensive chronic kidney disease with stage 1 through stage 4 chronic kidney disease, or unspecified chronic kidney disease: Secondary | ICD-10-CM | POA: Diagnosis not present

## 2015-03-04 DIAGNOSIS — Z7982 Long term (current) use of aspirin: Secondary | ICD-10-CM | POA: Diagnosis not present

## 2015-03-04 DIAGNOSIS — M10072 Idiopathic gout, left ankle and foot: Secondary | ICD-10-CM | POA: Diagnosis not present

## 2015-03-04 DIAGNOSIS — Z9889 Other specified postprocedural states: Secondary | ICD-10-CM | POA: Diagnosis not present

## 2015-03-04 DIAGNOSIS — Z8669 Personal history of other diseases of the nervous system and sense organs: Secondary | ICD-10-CM | POA: Insufficient documentation

## 2015-03-04 DIAGNOSIS — Z950 Presence of cardiac pacemaker: Secondary | ICD-10-CM | POA: Insufficient documentation

## 2015-03-04 DIAGNOSIS — J45909 Unspecified asthma, uncomplicated: Secondary | ICD-10-CM | POA: Diagnosis not present

## 2015-03-04 DIAGNOSIS — E860 Dehydration: Secondary | ICD-10-CM | POA: Diagnosis not present

## 2015-03-04 DIAGNOSIS — N182 Chronic kidney disease, stage 2 (mild): Secondary | ICD-10-CM | POA: Insufficient documentation

## 2015-03-04 DIAGNOSIS — E1165 Type 2 diabetes mellitus with hyperglycemia: Secondary | ICD-10-CM | POA: Diagnosis not present

## 2015-03-04 DIAGNOSIS — Z8701 Personal history of pneumonia (recurrent): Secondary | ICD-10-CM | POA: Insufficient documentation

## 2015-03-04 DIAGNOSIS — R7309 Other abnormal glucose: Secondary | ICD-10-CM | POA: Diagnosis not present

## 2015-03-04 DIAGNOSIS — R112 Nausea with vomiting, unspecified: Secondary | ICD-10-CM

## 2015-03-04 LAB — BASIC METABOLIC PANEL
Anion gap: 9 (ref 5–15)
BUN: 19 mg/dL (ref 6–20)
CO2: 25 mmol/L (ref 22–32)
Calcium: 9.2 mg/dL (ref 8.9–10.3)
Chloride: 110 mmol/L (ref 101–111)
Creatinine, Ser: 1.48 mg/dL — ABNORMAL HIGH (ref 0.61–1.24)
GFR calc Af Amer: >60 mL/min (ref >60)
GFR calc non Af Amer: 52 mL/min — ABNORMAL LOW (ref >60)
Glucose, Bld: 172 mg/dL — ABNORMAL HIGH (ref 65–99)
Potassium: 3.7 mmol/L (ref 3.5–5.1)
Sodium: 144 mmol/L (ref 135–145)

## 2015-03-04 LAB — URINALYSIS, ROUTINE W REFLEX MICROSCOPIC
Bilirubin Urine: NEGATIVE
Glucose, UA: 500 mg/dL — AB
Ketones, ur: NEGATIVE mg/dL
Leukocytes, UA: NEGATIVE
Nitrite: NEGATIVE
Protein, ur: >300 mg/dL — AB
Specific Gravity, Urine: 1.015 (ref 1.005–1.030)
Urobilinogen, UA: 0.2 mg/dL (ref 0.0–1.0)
pH: 5.5 (ref 5.0–8.0)

## 2015-03-04 LAB — CBC WITH DIFFERENTIAL/PLATELET
Basophils Absolute: 0 10*3/uL (ref 0.0–0.1)
Basophils Relative: 0 %
Eosinophils Absolute: 0.1 10*3/uL (ref 0.0–0.7)
Eosinophils Relative: 1 %
HCT: 49 % (ref 39.0–52.0)
Hemoglobin: 16.7 g/dL (ref 13.0–17.0)
Lymphocytes Relative: 23 %
Lymphs Abs: 3 10*3/uL (ref 0.7–4.0)
MCH: 30.6 pg (ref 26.0–34.0)
MCHC: 34.1 g/dL (ref 30.0–36.0)
MCV: 89.9 fL (ref 78.0–100.0)
Monocytes Absolute: 1.3 10*3/uL — ABNORMAL HIGH (ref 0.1–1.0)
Monocytes Relative: 10 %
Neutro Abs: 8.7 10*3/uL — ABNORMAL HIGH (ref 1.7–7.7)
Neutrophils Relative %: 66 %
Platelets: 203 10*3/uL (ref 150–400)
RBC: 5.45 MIL/uL (ref 4.22–5.81)
RDW: 13 % (ref 11.5–15.5)
WBC: 13.2 10*3/uL — ABNORMAL HIGH (ref 4.0–10.5)

## 2015-03-04 LAB — URINE MICROSCOPIC-ADD ON

## 2015-03-04 LAB — CBG MONITORING, ED
Glucose-Capillary: 274 mg/dL — ABNORMAL HIGH (ref 65–99)
Glucose-Capillary: 161 mg/dL — ABNORMAL HIGH (ref 65–99)

## 2015-03-04 MED ORDER — SODIUM CHLORIDE 0.9 % IV BOLUS (SEPSIS)
1000.0000 mL | Freq: Once | INTRAVENOUS | Status: AC
  Administered 2015-03-04: 1000 mL via INTRAVENOUS

## 2015-03-04 NOTE — Discharge Instructions (Signed)

## 2015-03-04 NOTE — ED Notes (Signed)
Bed: [id number] Expected date:  Expected time:  Means of arrival:  Comments: EMS- 54yo M,  [place]

## 2015-03-04 NOTE — ED Provider Notes (Signed)
CSN: [id number]     Arrival date & time 03/04/15  1331 History   First MD Initiated Contact with Patient 03/04/15 1501     Chief Complaint  Patient presents with  . Hyperglycemia     (Consider location/radiation/quality/duration/timing/severity/associated sxs/prior Treatment) Patient is a 54 y.o. male presenting with vomiting. The history is provided by the patient.  Emesis Severity:  Moderate Duration:  2 days Timing:  Intermittent Number of daily episodes:  2 Quality:  Stomach contents Progression:  Unchanged Chronicity:  New Recent urination:  Normal Relieved by:  Nothing Worsened by:  Nothing tried Ineffective treatments:  None tried Associated symptoms: no fever   Risk factors: diabetes and suspect food intake (canned tomatoes in nachos)   Risk factors: no sick contacts     Past Medical History  Diagnosis Date  . Second degree Mobitz II AV block     with syncope, s/p PPM  . Bifascicular block   . Pacemaker     02/12/11  . Lymphedema of lower extremity LEFT  >  RIGHT    "using ankle high socks at home; pump doesn't work for me" (07/23/2014)  . Short of breath on exertion   . History of cellulitis of skin with lymphangitis LEFT LEG  . Diastolic heart failure secondary to hypertrophic cardiomyopathy CARDIOLOGIST-- DR Daneen Schick  . Edema 07/2013  . Chronic bronchitis     "seasonal; get it q yr"  . Pneumonia 08/2011  . Bell's palsy   . Type II diabetes mellitus 2012    INSULIN DEPENDENT  . Hypertension 20 years  . Fabry's disease RENAL AND CARDIAC INVOVLEMENT    FOLLOWED DR [place]  . Chronic kidney disease (CKD), stage II (mild)     stage II to III/notes 07/23/2014  . Gout, arthritis 2014    bil feet. right worse  . Seasonal asthma NO INHALERS    30 years  . CHF (congestive heart failure) 2010   Past Surgical History  Procedure Laterality Date  . Cardiac catheterization  03-12-2011  DR Daneen Schick    HYPERTROPHIC CARDIOMYOPATHY WITH LV CAVITY  APPEARANCE  CONSISTANT WITH SIGNIFICANT APICAL HYPERTROPHY/ NORMAL LVSF / EF 55%/ EVIDENCE OF DIASTOLIC DYSFUNCTION WITH EDP OF 23-49mmHg AFTER A-WAVE/ NORMAL CORONARY ARTERIES  . Umbilical hernia repair  03/22/2012    Procedure: HERNIA REPAIR UMBILICAL ADULT;  Surgeon: Leighton Ruff, MD;  Location: [facility];  Service: General;  Laterality: N/A;  Umbilical Hernia Repair   . Hernia repair  0000000    Umbilical Hernia Repair  . Irrigation and debridement abscess N/A 07/27/2013    Procedure: IRRIGATION AND DEBRIDEMENT OF SKIN, SOFT TISSUE AND MUSCLES OF UPPER BACK (11X19X4cm) WITH 10 BLADE AND PULSATILE LAVAGE ;  Surgeon: Gayland Curry, MD;  Location: [facility];  Service: General;  Laterality: N/A;  . Insert / replace / remove pacemaker  02/12/2011    SJM implanted by Dr Rayann Heman for Mobitz II second degree AV block and syncope  . Incision and drainage perirectal abscess Left 07/29/2014    Procedure: IRRIGATION AND DEBRIDEMENT PERIRECTAL ABSCESS;  Surgeon: Doreen Salvage, MD;  Location: [facility];  Service: General;  Laterality: Left;  Prone position   Family History  Problem Relation Age of Onset  . Colon cancer Neg Hx   . Stroke Mother    Social History  Substance Use Topics  . Smoking status: Never Smoker   . Smokeless tobacco: Never Used  . Alcohol Use: Yes     Comment: 07/23/2014 "might have  a few drinks on holidays or at cookouts"    Review of Systems  Gastrointestinal: Positive for vomiting.  All other systems reviewed and are negative.     Allergies  Shellfish allergy  Home Medications   Prior to Admission medications   Medication Sig Start Date End Date Taking? Authorizing Provider  acetaminophen (TYLENOL) 500 MG tablet Take 500 mg by mouth once. Only taking 1 hour before infusion.  AS premed for Fabrazyme   Yes Historical Provider, MD  Agalsidase beta (FABRAZYME IV) Inject 115 mg into the vein every 14 (fourteen) days.    Yes Historical Provider, MD  ALBUTEROL SULFATE HFA IN Inhale 1 puff into the lungs  daily as needed (shortness of breath or wheezing).    Yes Historical Provider, MD  allopurinol (ZYLOPRIM) 300 MG tablet Take 300 mg by mouth daily.   Yes Historical Provider, MD  aspirin 81 MG tablet Take 81 mg by mouth daily.    Yes Historical Provider, MD  colchicine 0.6 MG tablet Take 0.6 mg by mouth daily.   Yes Historical Provider, MD  eplerenone (INSPRA) 50 MG tablet Take 50 mg by mouth daily.   Yes Historical Provider, MD  fexofenadine (ALLEGRA) 180 MG tablet Take 180 mg by mouth every 14 (fourteen) days.    Yes Historical Provider, MD  insulin aspart protamine- aspart (NOVOLOG MIX 70/30) (70-30) 100 UNIT/ML injection 20 U in the morning with breakfast, 15 U in the evening with dinner Patient taking differently: Inject 1-28 Units into the skin 3 (three) times daily with meals.  08/30/14  Yes Josalyn Funches, MD  losartan-hydrochlorothiazide (HYZAAR) 50-12.5 MG per tablet Take 1 tablet by mouth daily. 07/18/14  Yes Historical Provider, MD  vitamin C (VITAMIN C) 500 MG tablet Take 1 tablet (500 mg total) by mouth 2 (two) times daily. 08/02/14  Yes Robbie Lis, MD   BP 133/83 mmHg  Pulse 78  Temp(Src) 98.2 F (36.8 C) (Oral)  Resp 16  SpO2 98% Physical Exam  Constitutional: He is oriented to person, place, and time. He appears well-developed and well-nourished. No distress.  HENT:  Head: Normocephalic and atraumatic.  Eyes: Conjunctivae are normal.  Neck: Neck supple. No tracheal deviation present.  Cardiovascular: Normal rate, regular rhythm and normal heart sounds.   Pulmonary/Chest: Effort normal and breath sounds normal. No respiratory distress. He has no wheezes.  Abdominal: Soft. He exhibits no distension. There is no tenderness.  Neurological: He is alert and oriented to person, place, and time.  Skin: Skin is warm and dry.  Psychiatric: He has a normal mood and affect.    ED Course  Procedures (including critical care time) Labs Review Labs Reviewed  BASIC METABOLIC  PANEL - Abnormal; Notable for the following:    Glucose, Bld 172 (*)    Creatinine, Ser 1.48 (*)    GFR calc non Af Amer 52 (*)    All other components within normal limits  CBC WITH DIFFERENTIAL/PLATELET - Abnormal; Notable for the following:    WBC 13.2 (*)    Neutro Abs 8.7 (*)    Monocytes Absolute 1.3 (*)    All other components within normal limits  URINALYSIS, ROUTINE W REFLEX MICROSCOPIC (NOT AT [facility]) - Abnormal; Notable for the following:    Glucose, UA 500 (*)    Hgb urine dipstick TRACE (*)    Protein, ur >300 (*)    All other components within normal limits  URINE MICROSCOPIC-ADD ON - Abnormal; Notable for the following:  Squamous Epithelial / LPF FEW (*)    Casts HYALINE CASTS (*)    All other components within normal limits  CBG MONITORING, ED - Abnormal; Notable for the following:    Glucose-Capillary 274 (*)    All other components within normal limits  CBG MONITORING, ED - Abnormal; Notable for the following:    Glucose-Capillary 161 (*)    All other components within normal limits    Imaging Review No results found. I have personally reviewed and evaluated these images and lab results as part of my medical decision-making.   EKG Interpretation None      MDM   Final diagnoses:  Non-intractable vomiting with nausea, vomiting of unspecified type  Mild dehydration   54 y.o.  male presents with intermittent vomiting since eating nachos watching football 2 days ago. He has noticed hyperglycemia and feeling unwell since that time. No evidence of DKA or HNK. Otherwise well appearing. No fevers or abdominal pain. Given fluids for losses and hyperglycemia, other supportive care measures with improvement of symptoms. Plan to follow up with PCP as needed and return precautions discussed for worsening or new concerning symptoms.     Leo Grosser, MD 03/05/15 [phone]

## 2015-03-04 NOTE — ED Notes (Addendum)
per [facility] pt from facility visiting family member . Pt reports feeling not well since Sunday, flu symptoms, nausea with emesis. CBG by EMS 299 , HX type I diabetes. Also reports dizziness. Pt received 4 mg IV zofran . Pt reports relief from nausea. Pt reports taking Novolog around 9 AM today. Pt denies SOB, nor chest pain. Pt alert and oriented x 4.

## 2015-03-07 ENCOUNTER — Encounter (HOSPITAL_COMMUNITY)
Admission: RE | Admit: 2015-03-07 | Discharge: 2015-03-07 | Disposition: A | Discharge status: Home / Self Care | Payer: Medicare Other | Source: Ambulatory Visit | Attending: Nephrology | Admitting: Nephrology

## 2015-03-07 ENCOUNTER — Encounter (HOSPITAL_COMMUNITY): Payer: Self-pay

## 2015-03-07 DIAGNOSIS — E7521 Fabry (-Anderson) disease: Secondary | ICD-10-CM | POA: Diagnosis not present

## 2015-03-07 DIAGNOSIS — E1101 Type 2 diabetes mellitus with hyperosmolarity with coma: Secondary | ICD-10-CM | POA: Diagnosis not present

## 2015-03-07 LAB — COMPREHENSIVE METABOLIC PANEL
ALT: 25 U/L (ref 17–63)
AST: 35 U/L (ref 15–41)
Albumin: 3.4 g/dL — ABNORMAL LOW (ref 3.5–5.0)
Alkaline Phosphatase: 77 U/L (ref 38–126)
Anion gap: 9 (ref 5–15)
BUN: 20 mg/dL (ref 6–20)
CO2: 22 mmol/L (ref 22–32)
Calcium: 9 mg/dL (ref 8.9–10.3)
Chloride: 106 mmol/L (ref 101–111)
Creatinine, Ser: 1.52 mg/dL — ABNORMAL HIGH (ref 0.61–1.24)
GFR calc Af Amer: 58 mL/min — ABNORMAL LOW (ref >60)
GFR calc non Af Amer: 50 mL/min — ABNORMAL LOW (ref >60)
Glucose, Bld: 236 mg/dL — ABNORMAL HIGH (ref 65–99)
Potassium: 3.4 mmol/L — ABNORMAL LOW (ref 3.5–5.1)
Sodium: 137 mmol/L (ref 135–145)
Total Bilirubin: 0.4 mg/dL (ref 0.3–1.2)
Total Protein: 7.7 g/dL (ref 6.5–8.1)

## 2015-03-07 LAB — PHOSPHORUS: Phosphorus: 3.7 mg/dL (ref 2.5–4.6)

## 2015-03-07 MED ORDER — SODIUM CHLORIDE 0.9 % IV SOLN
INTRAVENOUS | Status: DC
  Administered 2015-03-07: 08:00:00 via INTRAVENOUS

## 2015-03-07 MED ORDER — SODIUM CHLORIDE 0.9 % IV SOLN
115.0000 mg | INTRAVENOUS | Status: DC
  Administered 2015-03-07: 115 mg via INTRAVENOUS
  Filled 2015-03-07: qty 2

## 2015-03-07 NOTE — Discharge Instructions (Signed)
Agalsidase Beta injection What is this medicine? AGALSIDASE BETA is used to replace an enzyme that is missing in patients with Fabry disease. It is not a cure. This medicine may be used for other purposes; ask your health care provider or pharmacist if you have questions. COMMON BRAND NAME(S): Fabrazyme What should I tell my health care provider before I take this medicine? They need to know if you have any of these conditions: -heart disease -an unusual or allergic reaction to agalsidase beta, mannitol, other medicines, foods, dyes, or preservatives -pregnant or trying to get pregnant -breast-feeding How should I use this medicine? This medicine is for infusion into a vein. It is given by a health care professional in a hospital or clinic setting. Talk to your pediatrician regarding the use of this medicine in children. Special care may be needed. Overdosage: If you think you have taken too much of this medicine contact a poison control center or emergency room at once. NOTE: This medicine is only for you. Do not share this medicine with others. What if I miss a dose? It is important not to miss your dose. Call your doctor or health care professional if you are unable to keep an appointment. What may interact with this medicine? -amiodarone -chloroquine -gentamicin -hydroxychloroquine -monobenzone This list may not describe all possible interactions. Give your health care provider a list of all the medicines, herbs, non-prescription drugs, or dietary supplements you use. Also tell them if you smoke, drink alcohol, or use illegal drugs. Some items may interact with your medicine. What should I watch for while using this medicine? Visit your doctor or health care professional for regular checks on your progress. Tell your doctor or healthcare professional if your symptoms do not start to get better or if they get worse. There is a registry for patients with Fabry disease. The registry is  used to gather information about the disease and its effects. Talk to your health care provider if you would like to join the registry. What side effects may I notice from receiving this medicine? Side effects that you should report to your doctor or health care professional as soon as possible: -allergic reactions like skin rash, itching or hives, swelling of the face, lips, or tongue -breathing problems -chest pain, tightness -depression -dizziness -fast, irregular heart beat -swelling of the arms or legs Side effects that usually do not require medical attention (report to your doctor or health care professional if they continue or are bothersome): -aches or pains -anxiety -fever or chills at the time of injection -headache -nausea, vomiting -stomach pain, upset This list may not describe all possible side effects. Call your doctor for medical advice about side effects. You may report side effects to FDA at 1-800-FDA-1088. Where should I keep my medicine? This drug is given in a hospital or clinic and will not be stored at home. NOTE: This sheet is a summary. It may not cover all possible information. If you have questions about this medicine, talk to your doctor, pharmacist, or health care provider.  2015, Elsevier/Gold Standard. (2006-01-31 12:25:00)

## 2015-03-10 ENCOUNTER — Telehealth: Payer: Self-pay | Admitting: Family Medicine

## 2015-03-10 NOTE — Telephone Encounter (Signed)
Patient called requesting a med refill on eplerenone 50 mg. Please f/u with pt.

## 2015-03-17 NOTE — Telephone Encounter (Signed)
Patient called requesting a med refill on eplerenone 50 mg. Please f/u with pt

## 2015-03-19 NOTE — Telephone Encounter (Signed)
Patient called requesting a med refill on eplerenone 50 mg. Please f/u with pt

## 2015-03-21 ENCOUNTER — Encounter (HOSPITAL_COMMUNITY): Payer: Self-pay

## 2015-03-21 ENCOUNTER — Encounter (HOSPITAL_COMMUNITY)
Admission: RE | Admit: 2015-03-21 | Discharge: 2015-03-21 | Disposition: A | Discharge status: Home / Self Care | Payer: Medicare Other | Source: Ambulatory Visit | Attending: Nephrology | Admitting: Nephrology

## 2015-03-21 DIAGNOSIS — E1101 Type 2 diabetes mellitus with hyperosmolarity with coma: Secondary | ICD-10-CM | POA: Insufficient documentation

## 2015-03-21 DIAGNOSIS — E7521 Fabry (-Anderson) disease: Secondary | ICD-10-CM | POA: Diagnosis not present

## 2015-03-21 LAB — CBC WITH DIFFERENTIAL/PLATELET
Basophils Absolute: 0 10*3/uL (ref 0.0–0.1)
Basophils Relative: 0 %
Eosinophils Absolute: 0.4 10*3/uL (ref 0.0–0.7)
Eosinophils Relative: 6 %
HCT: 46.2 % (ref 39.0–52.0)
Hemoglobin: 15.4 g/dL (ref 13.0–17.0)
Lymphocytes Relative: 42 %
Lymphs Abs: 2.5 10*3/uL (ref 0.7–4.0)
MCH: 30.1 pg (ref 26.0–34.0)
MCHC: 33.3 g/dL (ref 30.0–36.0)
MCV: 90.2 fL (ref 78.0–100.0)
Monocytes Absolute: 0.5 10*3/uL (ref 0.1–1.0)
Monocytes Relative: 8 %
Neutro Abs: 2.7 10*3/uL (ref 1.7–7.7)
Neutrophils Relative %: 44 %
Platelets: 204 10*3/uL (ref 150–400)
RBC: 5.12 MIL/uL (ref 4.22–5.81)
RDW: 12.9 % (ref 11.5–15.5)
WBC: 6 10*3/uL (ref 4.0–10.5)

## 2015-03-21 LAB — LIPID PANEL
Cholesterol: 241 mg/dL — ABNORMAL HIGH (ref 0–200)
HDL: 62 mg/dL (ref >40)
LDL Cholesterol: 144 mg/dL — ABNORMAL HIGH (ref 0–99)
Total CHOL/HDL Ratio: 3.9 RATIO
Triglycerides: 177 mg/dL — ABNORMAL HIGH (ref <150)
VLDL: 35 mg/dL (ref 0–40)

## 2015-03-21 LAB — IRON AND TIBC
Iron: 112 ug/dL (ref 45–182)
Saturation Ratios: 38 % (ref 17.9–39.5)
TIBC: 294 ug/dL (ref 250–450)
UIBC: 182 ug/dL

## 2015-03-21 LAB — FERRITIN: Ferritin: 152 ng/mL (ref 24–336)

## 2015-03-21 MED ORDER — AGALSIDASE BETA 5 MG IV SOLR
115.0000 mg | INTRAVENOUS | Status: DC
  Administered 2015-03-21: 115 mg via INTRAVENOUS
  Filled 2015-03-21: qty 21

## 2015-03-21 MED ORDER — SODIUM CHLORIDE 0.9 % IV SOLN
INTRAVENOUS | Status: DC
  Administered 2015-03-21: 08:00:00 via INTRAVENOUS

## 2015-03-26 NOTE — Telephone Encounter (Signed)
Patient called and requested a med refill for on eplerenone 50mg . Please f/u

## 2015-04-01 ENCOUNTER — Other Ambulatory Visit: Payer: Self-pay | Admitting: Family Medicine

## 2015-04-01 NOTE — Telephone Encounter (Signed)
Doesn't seem like Dr.Funches ever prescribed that medication for him. He would need an office and proper medication reconciliation visit as he hasn't been seen in the last 4 months.

## 2015-04-01 NOTE — Telephone Encounter (Signed)
Pt. Called requesting a med refill on eplerenone (INSPRA) 50 MG tablet. Please f/u with pt.

## 2015-04-04 ENCOUNTER — Encounter (HOSPITAL_COMMUNITY): Payer: Self-pay

## 2015-04-04 ENCOUNTER — Encounter (HOSPITAL_COMMUNITY)
Admission: RE | Admit: 2015-04-04 | Discharge: 2015-04-04 | Disposition: A | Discharge status: Home / Self Care | Payer: Medicare Other | Source: Ambulatory Visit | Attending: Nephrology | Admitting: Nephrology

## 2015-04-04 DIAGNOSIS — E1101 Type 2 diabetes mellitus with hyperosmolarity with coma: Secondary | ICD-10-CM | POA: Diagnosis not present

## 2015-04-04 DIAGNOSIS — E7521 Fabry (-Anderson) disease: Secondary | ICD-10-CM | POA: Diagnosis not present

## 2015-04-04 LAB — COMPREHENSIVE METABOLIC PANEL
ALT: 24 U/L (ref 17–63)
AST: 33 U/L (ref 15–41)
Albumin: 3.1 g/dL — ABNORMAL LOW (ref 3.5–5.0)
Alkaline Phosphatase: 78 U/L (ref 38–126)
Anion gap: 6 (ref 5–15)
BUN: 17 mg/dL (ref 6–20)
CO2: 28 mmol/L (ref 22–32)
Calcium: 8.7 mg/dL — ABNORMAL LOW (ref 8.9–10.3)
Chloride: 107 mmol/L (ref 101–111)
Creatinine, Ser: 1.24 mg/dL (ref 0.61–1.24)
GFR calc Af Amer: >60 mL/min (ref >60)
GFR calc non Af Amer: >60 mL/min (ref >60)
Glucose, Bld: 211 mg/dL — ABNORMAL HIGH (ref 65–99)
Potassium: 4.1 mmol/L (ref 3.5–5.1)
Sodium: 141 mmol/L (ref 135–145)
Total Bilirubin: 0.4 mg/dL (ref 0.3–1.2)
Total Protein: 7.3 g/dL (ref 6.5–8.1)

## 2015-04-04 LAB — URINE MICROSCOPIC-ADD ON

## 2015-04-04 LAB — URINALYSIS, ROUTINE W REFLEX MICROSCOPIC
Bilirubin Urine: NEGATIVE
Glucose, UA: NEGATIVE mg/dL
Hgb urine dipstick: NEGATIVE
Ketones, ur: NEGATIVE mg/dL
Leukocytes, UA: NEGATIVE
Nitrite: NEGATIVE
Protein, ur: 100 mg/dL — AB
Specific Gravity, Urine: 1.01 (ref 1.005–1.030)
Urobilinogen, UA: 0.2 mg/dL (ref 0.0–1.0)
pH: 6 (ref 5.0–8.0)

## 2015-04-04 LAB — PHOSPHORUS: Phosphorus: 3.8 mg/dL (ref 2.5–4.6)

## 2015-04-04 MED ORDER — SODIUM CHLORIDE 0.9 % IV SOLN
115.0000 mg | INTRAVENOUS | Status: DC
  Administered 2015-04-04: 115 mg via INTRAVENOUS
  Filled 2015-04-04: qty 21

## 2015-04-04 MED ORDER — SODIUM CHLORIDE 0.9 % IV SOLN
INTRAVENOUS | Status: DC
  Administered 2015-04-04: 08:00:00 via INTRAVENOUS

## 2015-04-07 DIAGNOSIS — M109 Gout, unspecified: Secondary | ICD-10-CM | POA: Diagnosis not present

## 2015-04-07 DIAGNOSIS — E7521 Fabry (-Anderson) disease: Secondary | ICD-10-CM | POA: Diagnosis not present

## 2015-04-07 DIAGNOSIS — I129 Hypertensive chronic kidney disease with stage 1 through stage 4 chronic kidney disease, or unspecified chronic kidney disease: Secondary | ICD-10-CM | POA: Diagnosis not present

## 2015-04-07 DIAGNOSIS — S21209A Unspecified open wound of unspecified back wall of thorax without penetration into thoracic cavity, initial encounter: Secondary | ICD-10-CM | POA: Diagnosis not present

## 2015-04-07 DIAGNOSIS — E118 Type 2 diabetes mellitus with unspecified complications: Secondary | ICD-10-CM | POA: Diagnosis not present

## 2015-04-07 DIAGNOSIS — I890 Lymphedema, not elsewhere classified: Secondary | ICD-10-CM | POA: Diagnosis not present

## 2015-04-07 DIAGNOSIS — N182 Chronic kidney disease, stage 2 (mild): Secondary | ICD-10-CM | POA: Diagnosis not present

## 2015-04-07 DIAGNOSIS — I441 Atrioventricular block, second degree: Secondary | ICD-10-CM | POA: Diagnosis not present

## 2015-04-15 ENCOUNTER — Ambulatory Visit: Payer: Self-pay | Admitting: General Surgery

## 2015-04-15 DIAGNOSIS — K605 Anorectal fistula: Secondary | ICD-10-CM | POA: Diagnosis not present

## 2015-04-18 ENCOUNTER — Encounter (HOSPITAL_COMMUNITY)
Admission: RE | Admit: 2015-04-18 | Discharge: 2015-04-18 | Disposition: A | Discharge status: Home / Self Care | Payer: Medicare Other | Source: Ambulatory Visit | Attending: Nephrology | Admitting: Nephrology

## 2015-04-18 DIAGNOSIS — E7521 Fabry (-Anderson) disease: Secondary | ICD-10-CM | POA: Diagnosis not present

## 2015-04-18 DIAGNOSIS — E1101 Type 2 diabetes mellitus with hyperosmolarity with coma: Secondary | ICD-10-CM | POA: Insufficient documentation

## 2015-04-18 MED ORDER — SODIUM CHLORIDE 0.9 % IV SOLN
115.0000 mg | INTRAVENOUS | Status: DC
  Administered 2015-04-18: 115 mg via INTRAVENOUS
  Filled 2015-04-18: qty 21

## 2015-04-18 MED ORDER — SODIUM CHLORIDE 0.9 % IV SOLN
INTRAVENOUS | Status: DC
  Administered 2015-04-18: 250 mL via INTRAVENOUS

## 2015-04-18 NOTE — Progress Notes (Signed)
Pt arrives today for his Fabrazyme infusion. He states he took Tylenol 500mg  and Allegra 1 tablet at 0855 this AM prior to arrival

## 2015-04-21 ENCOUNTER — Telehealth: Payer: Self-pay | Admitting: Family Medicine

## 2015-04-21 ENCOUNTER — Encounter: Payer: Self-pay | Admitting: Family Medicine

## 2015-04-21 ENCOUNTER — Ambulatory Visit: Payer: Medicare Other | Attending: Family Medicine | Admitting: Family Medicine

## 2015-04-21 VITALS — BP 136/87 | HR 72 | Temp 98.6°F | Resp 10 | Ht 70.0 in | Wt 262.0 lb

## 2015-04-21 DIAGNOSIS — E1159 Type 2 diabetes mellitus with other circulatory complications: Secondary | ICD-10-CM | POA: Insufficient documentation

## 2015-04-21 DIAGNOSIS — E1165 Type 2 diabetes mellitus with hyperglycemia: Secondary | ICD-10-CM

## 2015-04-21 DIAGNOSIS — Z0000 Encounter for general adult medical examination without abnormal findings: Secondary | ICD-10-CM | POA: Diagnosis not present

## 2015-04-21 DIAGNOSIS — Z23 Encounter for immunization: Secondary | ICD-10-CM | POA: Insufficient documentation

## 2015-04-21 DIAGNOSIS — L97529 Non-pressure chronic ulcer of other part of left foot with unspecified severity: Secondary | ICD-10-CM | POA: Insufficient documentation

## 2015-04-21 DIAGNOSIS — Z794 Long term (current) use of insulin: Secondary | ICD-10-CM

## 2015-04-21 DIAGNOSIS — L97521 Non-pressure chronic ulcer of other part of left foot limited to breakdown of skin: Secondary | ICD-10-CM | POA: Diagnosis not present

## 2015-04-21 DIAGNOSIS — IMO0002 Reserved for concepts with insufficient information to code with codable children: Secondary | ICD-10-CM

## 2015-04-21 DIAGNOSIS — I10 Essential (primary) hypertension: Secondary | ICD-10-CM | POA: Diagnosis not present

## 2015-04-21 LAB — POCT URINALYSIS DIPSTICK
Bilirubin, UA: NEGATIVE
Glucose, UA: 500
Ketones, UA: NEGATIVE
Leukocytes, UA: NEGATIVE
Nitrite, UA: NEGATIVE
Protein, UA: >=300
Spec Grav, UA: 1.015
Urobilinogen, UA: 0.2
pH, UA: 6

## 2015-04-21 LAB — POCT GLYCOSYLATED HEMOGLOBIN (HGB A1C): Hemoglobin A1C: 10.5

## 2015-04-21 LAB — GLUCOSE, POCT (MANUAL RESULT ENTRY)
POC Glucose: 408 mg/dl — AB (ref 70–99)
POC Glucose: 419 mg/dl — AB (ref 70–99)

## 2015-04-21 MED ORDER — EPLERENONE 50 MG PO TABS
50.0000 mg | ORAL_TABLET | Freq: Every day | ORAL | Status: DC
Start: 2015-04-21 — End: 2015-11-11

## 2015-04-21 MED ORDER — INSULIN ASPART 100 UNIT/ML ~~LOC~~ SOLN
10.0000 [IU] | Freq: Once | SUBCUTANEOUS | Status: AC
  Administered 2015-04-21: 10 [IU] via SUBCUTANEOUS

## 2015-04-21 MED ORDER — INSULIN ASPART PROT & ASPART (70-30 MIX) 100 UNIT/ML ~~LOC~~ SUSP: 15.0000 [IU] | Freq: Two times a day (BID) | SUBCUTANEOUS | Status: DC

## 2015-04-21 NOTE — Assessment & Plan Note (Signed)
A: ulcer in setting of chronic swelling. No evidence of infection.  P: Dressed with hydrocolloid dressing  Compression

## 2015-04-21 NOTE — Progress Notes (Signed)
Subjective:  [name], male    DOB: 01/30/1961  Age: 54 y.o. MRN: [id number]  CC: Diabetes   HPI CRAMER MCNERNEY presents for   1. CHRONIC DIABETES  Disease Monitoring  Blood Sugar Ranges: 212- 300s   Polyuria: yes   Visual problems: no   Medication Compliance: yes  Medication Side Effects  Hypoglycemia: no   Preventitive Health Care  Eye Exam: due   Foot Exam: done today   Diet pattern: improving, had recent stomach well   Exercise: walking and yard work   2. HTN: needs eplerenone refilled. Taking BP medicine. No HA, CP or SOB. Has chronic leg swelling.  3. Ulcer: ulcer on dorusm of L foot for 2 + years. Painless. Scabs over a times. No drainage. Patient wears compression stockings.   Social History  Substance Use Topics  . Smoking status: Never Smoker   . Smokeless tobacco: Never Used  . Alcohol Use: Yes     Comment: 07/23/2014 "might have a few drinks on holidays or at cookouts"    Outpatient Prescriptions Prior to Visit  Medication Sig Dispense Refill  . acetaminophen (TYLENOL) 500 MG tablet Take 500 mg by mouth once. Only taking 1 hour before infusion.  AS premed for Fabrazyme    . Agalsidase beta (FABRAZYME IV) Inject 115 mg into the vein every 14 (fourteen) days.     . ALBUTEROL SULFATE HFA IN Inhale 1 puff into the lungs daily as needed (shortness of breath or wheezing).     [name] Kitchen allopurinol (ZYLOPRIM) 300 MG tablet Take 300 mg by mouth daily.    [name] Kitchen aspirin 81 MG tablet Take 81 mg by mouth daily.     . colchicine 0.6 MG tablet Take 0.6 mg by mouth daily.    [name] Kitchen eplerenone (INSPRA) 50 MG tablet Take 50 mg by mouth daily.    . fexofenadine (ALLEGRA) 180 MG tablet Take 180 mg by mouth every 14 (fourteen) days.     . insulin aspart protamine- aspart (NOVOLOG MIX 70/30) (70-30) 100 UNIT/ML injection 20 U in the morning with breakfast, 15 U in the evening with dinner (Patient taking differently: Inject 1-28 Units into the skin 3 (three) times daily  with meals. ) 10 mL 11  . losartan-hydrochlorothiazide (HYZAAR) 50-12.5 MG per tablet Take 1 tablet by mouth daily.  6  . meclizine (ANTIVERT) 12.5 MG tablet Take 12.5 mg by mouth 3 (three) times daily as needed for dizziness.    . vitamin C (VITAMIN C) 500 MG tablet Take 1 tablet (500 mg total) by mouth 2 (two) times daily. 60 tablet 0   No facility-administered medications prior to visit.    ROS Review of Systems  Constitutional: Negative for fever, chills, fatigue and unexpected weight change.  Eyes: Negative for visual disturbance.  Respiratory: Negative for cough and shortness of breath.   Cardiovascular: Positive for leg swelling. Negative for chest pain and palpitations.  Gastrointestinal: Negative for nausea, vomiting, abdominal pain, diarrhea, constipation and blood in stool.  Endocrine: Negative for polydipsia, polyphagia and polyuria.  Musculoskeletal: Negative for myalgias, back pain, arthralgias, gait problem and neck pain.  Skin: Positive for wound. Negative for rash.  Allergic/Immunologic: Negative for immunocompromised state.  Hematological: Negative for adenopathy. Does not bruise/bleed easily.  Psychiatric/Behavioral: Negative for suicidal ideas, sleep disturbance and dysphoric mood. The patient is not nervous/anxious.     Objective:  BP 136/87 mmHg  Pulse 72  Temp(Src) 98.6 F (37 C) (Oral)  Resp 10  Ht 5\' 10"  (1.778 m)  Wt 262 lb (118.842 kg)  BMI 37.59 kg/m2  SpO2 98%  BP/Weight 04/21/2015 04/18/2015 [id number]  Systolic BP [id number] [id number]  Diastolic BP 87 98 87  Wt. (Lbs) 262 261.5 264.2  BMI 37.59 37.52 37.91   Physical Exam  Constitutional: He appears well-developed and well-nourished. No distress.  HENT:  Head: Normocephalic and atraumatic.  Neck: Normal range of motion. Neck supple.  Cardiovascular: Normal rate, regular rhythm, normal heart sounds and intact distal pulses.   Pulmonary/Chest: Effort normal and breath sounds normal.  Musculoskeletal:  He exhibits edema.  Neurological: He is alert.  Skin: Skin is warm and dry. No rash noted. No erythema.     Psychiatric: He has a normal mood and affect.    Lab Results  Component Value Date   HGBA1C 6.70 11/20/2014   Lab Results  Component Value Date   HGBA1C 10.50 04/21/2015    CBG  408 UA:  Negative ketones   Treated with 10 U of novolog  Repeat CBG 419   Assessment & Plan:   Problem List Items Addressed This Visit    Chronic ulcer of left foot (HCC)   Hypertension (Chronic)   Relevant Medications   eplerenone (INSPRA) 50 MG tablet    Other Visit Diagnoses    Uncontrolled type 2 diabetes mellitus with other circulatory complication, with long-term current use of insulin (HCC)    -  Primary    Relevant Medications    insulin aspart (novoLOG) injection 10 Units (Completed)    insulin aspart protamine- aspart (NOVOLOG MIX 70/30) (70-30) 100 UNIT/ML injection    Other Relevant Orders    POCT glucose (manual entry) (Completed)    POCT glycosylated hemoglobin (Hb A1C) (Completed)    POCT urinalysis dipstick (Completed)    POCT glucose (manual entry) (Completed)    Healthcare maintenance        Relevant Orders    Flu Vaccine QUAD 36+ mos IM (Completed)       No orders of the defined types were placed in this encounter.    Follow-up: No Follow-up on file.   Boykin Nearing MD

## 2015-04-21 NOTE — Patient Instructions (Signed)
[name] was seen today for diabetes.  Diagnoses and all orders for this visit:  Uncontrolled type 2 diabetes mellitus with other circulatory complication, with long-term current use of insulin (HCC) -     POCT glucose (manual entry) -     POCT glycosylated hemoglobin (Hb A1C) -     insulin aspart (novoLOG) injection 10 Units; Inject 0.1 mLs (10 Units total) into the skin once. -     POCT urinalysis dipstick -     insulin aspart protamine- aspart (NOVOLOG MIX 70/30) (70-30) 100 UNIT/ML injection; Inject 0.15 mLs (15 Units total) into the skin 2 (two) times daily with a meal.  Healthcare maintenance -     Flu Vaccine QUAD 36+ mos IM  Chronic ulcer of left foot, limited to breakdown of skin (HCC)  Essential hypertension -     eplerenone (INSPRA) 50 MG tablet; Take 1 tablet (50 mg total) by mouth daily.    [name], unfortunately your diabetes has gone from controlled to uncontrolled since last visit.  Please take 70/30 twice daily 15 U to start with plan to increase slowly as needed to get sugars to goal  Diabetes blood sugar goals  Fasting (in AM before breakfast, 8 hrs of no eating or drinking (except water or unsweetened coffee or tea): 90-110 2 hrs after meals: < 160,   No low sugars: nothing < 70   Write dow sugars  F/u in 4 weeks with pharmacy for blood sugar check F/u with me in 3 months   Dr. Adrian Blackwater

## 2015-04-21 NOTE — Telephone Encounter (Signed)
Patient dropped off Paperwork to be completed and signed by doctor. Disability forms

## 2015-04-24 ENCOUNTER — Telehealth: Payer: Self-pay | Admitting: Family Medicine

## 2015-04-24 NOTE — Telephone Encounter (Signed)
Patient dropped off paperwork to be completed and filled by the doctor.  Patient states that they will pick up the paperwork. Please follow up with patient

## 2015-04-25 NOTE — Telephone Encounter (Signed)
Patient called and requested a med refill for syringes, please f/u with pt.

## 2015-04-28 ENCOUNTER — Other Ambulatory Visit: Payer: Self-pay | Admitting: *Deleted

## 2015-04-28 MED ORDER — "INSULIN SYRINGE-NEEDLE U-100 31G X 5/16"" 0.5 ML MISC": 1.0000 | Freq: Three times a day (TID) | Status: DC

## 2015-04-28 NOTE — Telephone Encounter (Signed)
Rx send to [facility]  

## 2015-04-28 NOTE — Telephone Encounter (Signed)
Rx syringes refill send to [facility]

## 2015-04-29 NOTE — Telephone Encounter (Signed)
Disability forms completed and ready for patient pick up

## 2015-05-01 ENCOUNTER — Encounter (HOSPITAL_COMMUNITY): Payer: Self-pay | Admitting: Emergency Medicine

## 2015-05-01 ENCOUNTER — Inpatient Hospital Stay (HOSPITAL_COMMUNITY)
Admission: EM | Admit: 2015-05-01 | Discharge: 2015-05-03 | DRG: 638 | Disposition: A | Discharge status: Home / Self Care | Payer: Medicare Other | Attending: Internal Medicine | Admitting: Internal Medicine

## 2015-05-01 DIAGNOSIS — I5032 Chronic diastolic (congestive) heart failure: Secondary | ICD-10-CM

## 2015-05-01 DIAGNOSIS — E1165 Type 2 diabetes mellitus with hyperglycemia: Secondary | ICD-10-CM

## 2015-05-01 DIAGNOSIS — Z9119 Patient's noncompliance with other medical treatment and regimen: Secondary | ICD-10-CM | POA: Diagnosis not present

## 2015-05-01 DIAGNOSIS — I421 Obstructive hypertrophic cardiomyopathy: Secondary | ICD-10-CM

## 2015-05-01 DIAGNOSIS — R739 Hyperglycemia, unspecified: Secondary | ICD-10-CM | POA: Diagnosis present

## 2015-05-01 DIAGNOSIS — I422 Other hypertrophic cardiomyopathy: Secondary | ICD-10-CM | POA: Diagnosis present

## 2015-05-01 DIAGNOSIS — Z794 Long term (current) use of insulin: Secondary | ICD-10-CM | POA: Diagnosis not present

## 2015-05-01 DIAGNOSIS — N179 Acute kidney failure, unspecified: Secondary | ICD-10-CM | POA: Diagnosis not present

## 2015-05-01 DIAGNOSIS — E860 Dehydration: Secondary | ICD-10-CM | POA: Diagnosis present

## 2015-05-01 DIAGNOSIS — E1100 Type 2 diabetes mellitus with hyperosmolarity without nonketotic hyperglycemic-hyperosmolar coma (NKHHC): Secondary | ICD-10-CM | POA: Diagnosis present

## 2015-05-01 DIAGNOSIS — I495 Sick sinus syndrome: Secondary | ICD-10-CM | POA: Diagnosis not present

## 2015-05-01 DIAGNOSIS — I130 Hypertensive heart and chronic kidney disease with heart failure and stage 1 through stage 4 chronic kidney disease, or unspecified chronic kidney disease: Secondary | ICD-10-CM | POA: Diagnosis present

## 2015-05-01 DIAGNOSIS — E118 Type 2 diabetes mellitus with unspecified complications: Secondary | ICD-10-CM

## 2015-05-01 DIAGNOSIS — E1122 Type 2 diabetes mellitus with diabetic chronic kidney disease: Secondary | ICD-10-CM | POA: Diagnosis present

## 2015-05-01 DIAGNOSIS — R7309 Other abnormal glucose: Secondary | ICD-10-CM | POA: Diagnosis not present

## 2015-05-01 DIAGNOSIS — Z950 Presence of cardiac pacemaker: Secondary | ICD-10-CM

## 2015-05-01 LAB — CBG MONITORING, ED
Glucose-Capillary: >600 mg/dL (ref 65–99)
Glucose-Capillary: >600 mg/dL (ref 65–99)

## 2015-05-01 LAB — BASIC METABOLIC PANEL
Anion gap: 12 (ref 5–15)
BUN: 29 mg/dL — ABNORMAL HIGH (ref 6–20)
CO2: 25 mmol/L (ref 22–32)
Calcium: 9.3 mg/dL (ref 8.9–10.3)
Chloride: 92 mmol/L — ABNORMAL LOW (ref 101–111)
Creatinine, Ser: 2.27 mg/dL — ABNORMAL HIGH (ref 0.61–1.24)
GFR calc Af Amer: 36 mL/min — ABNORMAL LOW (ref >60)
GFR calc non Af Amer: 31 mL/min — ABNORMAL LOW (ref >60)
Glucose, Bld: 984 mg/dL (ref 65–99)
Potassium: 4.9 mmol/L (ref 3.5–5.1)
Sodium: 129 mmol/L — ABNORMAL LOW (ref 135–145)

## 2015-05-01 LAB — CBC
HCT: 52.2 % — ABNORMAL HIGH (ref 39.0–52.0)
Hemoglobin: 18.6 g/dL — ABNORMAL HIGH (ref 13.0–17.0)
MCH: 30.8 pg (ref 26.0–34.0)
MCHC: 35.6 g/dL (ref 30.0–36.0)
MCV: 86.4 fL (ref 78.0–100.0)
Platelets: 229 10*3/uL (ref 150–400)
RBC: 6.04 MIL/uL — ABNORMAL HIGH (ref 4.22–5.81)
RDW: 12.3 % (ref 11.5–15.5)
WBC: 11.9 10*3/uL — ABNORMAL HIGH (ref 4.0–10.5)

## 2015-05-01 LAB — CREATININE, SERUM
Creatinine, Ser: 2.1 mg/dL — ABNORMAL HIGH (ref 0.61–1.24)
GFR calc Af Amer: 39 mL/min — ABNORMAL LOW (ref >60)
GFR calc non Af Amer: 34 mL/min — ABNORMAL LOW (ref >60)

## 2015-05-01 LAB — GLUCOSE, CAPILLARY
Glucose-Capillary: 472 mg/dL — ABNORMAL HIGH (ref 65–99)
Glucose-Capillary: 316 mg/dL — ABNORMAL HIGH (ref 65–99)
Glucose-Capillary: 169 mg/dL — ABNORMAL HIGH (ref 65–99)
Glucose-Capillary: 87 mg/dL (ref 65–99)

## 2015-05-01 LAB — URINE MICROSCOPIC-ADD ON

## 2015-05-01 LAB — URINALYSIS, ROUTINE W REFLEX MICROSCOPIC
Bilirubin Urine: NEGATIVE
Glucose, UA: >1000 mg/dL — AB
Ketones, ur: NEGATIVE mg/dL
Leukocytes, UA: NEGATIVE
Nitrite: NEGATIVE
Protein, ur: NEGATIVE mg/dL
Specific Gravity, Urine: 1.022 (ref 1.005–1.030)
pH: 6 (ref 5.0–8.0)

## 2015-05-01 MED ORDER — ACETAMINOPHEN 650 MG RE SUPP: 650.0000 mg | Freq: Four times a day (QID) | RECTAL | Status: DC | PRN

## 2015-05-01 MED ORDER — ONDANSETRON HCL 4 MG PO TABS: 4.0000 mg | ORAL_TABLET | Freq: Four times a day (QID) | ORAL | Status: DC | PRN

## 2015-05-01 MED ORDER — SODIUM CHLORIDE 0.9 % IV SOLN
INTRAVENOUS | Status: DC
  Filled 2015-05-01: qty 2.5

## 2015-05-01 MED ORDER — ONDANSETRON HCL 4 MG/2ML IJ SOLN: 4.0000 mg | Freq: Four times a day (QID) | INTRAMUSCULAR | Status: DC | PRN

## 2015-05-01 MED ORDER — LORATADINE 10 MG PO TABS
10.0000 mg | ORAL_TABLET | Freq: Every day | ORAL | Status: DC
  Administered 2015-05-01 – 2015-05-03 (×2): 10 mg via ORAL
  Filled 2015-05-01 (×3): qty 1

## 2015-05-01 MED ORDER — SODIUM CHLORIDE 0.9 % IV SOLN
INTRAVENOUS | Status: DC
  Administered 2015-05-01: 21:00:00 via INTRAVENOUS

## 2015-05-01 MED ORDER — SODIUM CHLORIDE 0.9 % IV BOLUS (SEPSIS)
1000.0000 mL | Freq: Once | INTRAVENOUS | Status: AC
  Administered 2015-05-01: 1000 mL via INTRAVENOUS

## 2015-05-01 MED ORDER — HEPARIN SODIUM (PORCINE) 5000 UNIT/ML IJ SOLN
5000.0000 [IU] | Freq: Three times a day (TID) | INTRAMUSCULAR | Status: DC
  Administered 2015-05-01 – 2015-05-03 (×5): 5000 [IU] via SUBCUTANEOUS
  Filled 2015-05-01 (×6): qty 1

## 2015-05-01 MED ORDER — ALBUTEROL SULFATE (2.5 MG/3ML) 0.083% IN NEBU: 3.0000 mL | INHALATION_SOLUTION | RESPIRATORY_TRACT | Status: DC | PRN

## 2015-05-01 MED ORDER — COLCHICINE 0.6 MG PO TABS
0.6000 mg | ORAL_TABLET | Freq: Every day | ORAL | Status: DC
  Filled 2015-05-01: qty 1

## 2015-05-01 MED ORDER — ZOLPIDEM TARTRATE 5 MG PO TABS
5.0000 mg | ORAL_TABLET | Freq: Every evening | ORAL | Status: DC | PRN
Start: 2015-05-01 — End: 2015-05-03

## 2015-05-01 MED ORDER — DEXTROSE-NACL 5-0.45 % IV SOLN
INTRAVENOUS | Status: DC
  Administered 2015-05-01: via INTRAVENOUS

## 2015-05-01 MED ORDER — INSULIN REGULAR BOLUS VIA INFUSION: 0.0000 [IU] | Freq: Three times a day (TID) | INTRAVENOUS | Status: DC

## 2015-05-01 MED ORDER — VITAMIN C 500 MG PO TABS: 500.0000 mg | ORAL_TABLET | Freq: Two times a day (BID) | ORAL | Status: DC

## 2015-05-01 MED ORDER — INSULIN REGULAR HUMAN 100 UNIT/ML IJ SOLN
INTRAMUSCULAR | Status: DC | PRN
  Administered 2015-05-01: 5.4 [IU]/h via INTRAVENOUS
  Filled 2015-05-01: qty 2.5

## 2015-05-01 MED ORDER — ACETAMINOPHEN 325 MG PO TABS: 650.0000 mg | ORAL_TABLET | Freq: Four times a day (QID) | ORAL | Status: DC | PRN

## 2015-05-01 MED ORDER — HYDROCODONE-ACETAMINOPHEN 5-325 MG PO TABS: 1.0000 | ORAL_TABLET | ORAL | Status: DC | PRN

## 2015-05-01 MED ORDER — MECLIZINE HCL 12.5 MG PO TABS
12.5000 mg | ORAL_TABLET | Freq: Three times a day (TID) | ORAL | Status: DC | PRN
  Filled 2015-05-01: qty 1

## 2015-05-01 MED ORDER — ACETAMINOPHEN 500 MG PO TABS: 500.0000 mg | ORAL_TABLET | Freq: Once | ORAL | Status: DC

## 2015-05-01 MED ORDER — ALLOPURINOL 300 MG PO TABS
300.0000 mg | ORAL_TABLET | Freq: Every day | ORAL | Status: DC
  Administered 2015-05-02 – 2015-05-03 (×2): 300 mg via ORAL
  Filled 2015-05-01 (×2): qty 1

## 2015-05-01 MED ORDER — MORPHINE SULFATE (PF) 2 MG/ML IV SOLN: 1.0000 mg | INTRAVENOUS | Status: DC | PRN

## 2015-05-01 MED ORDER — DEXTROSE 50 % IV SOLN: 25.0000 mL | INTRAVENOUS | Status: DC | PRN

## 2015-05-01 MED ORDER — ASPIRIN EC 81 MG PO TBEC
81.0000 mg | DELAYED_RELEASE_TABLET | Freq: Every day | ORAL | Status: DC
  Administered 2015-05-03: 81 mg via ORAL
  Filled 2015-05-01 (×2): qty 1

## 2015-05-01 MED ORDER — SODIUM CHLORIDE 0.9 % IV SOLN: INTRAVENOUS | Status: DC

## 2015-05-01 NOTE — H&P (Addendum)
[facility]                                                                                    Patient Demographics  [name], is a 54 y.o. male  CSN: [id number]  MRN: [id number]  DOB - 1960/09/28  Admit Date - 05/01/2015  Outpatient Primary MD for the patient is Minerva Ends, MD   With History of -  Past Medical History  Diagnosis Date  . Second degree Mobitz II AV block     with syncope, s/p PPM  . Bifascicular block   . Pacemaker     02/12/11  . Lymphedema of lower extremity LEFT  >  RIGHT    "using ankle high socks at home; pump doesn't work for me" (07/23/2014)  . Short of breath on exertion   . History of cellulitis of skin with lymphangitis LEFT LEG  . Diastolic heart failure secondary to hypertrophic cardiomyopathy ([place]) CARDIOLOGIST-- DR Daneen Schick  . Edema 07/2013  . Chronic bronchitis ([place])     "seasonal; get it q yr"  . Pneumonia 08/2011  . Bell's palsy   . Type II diabetes mellitus ([place]) 2012    INSULIN DEPENDENT  . Hypertension 20 years  . 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 Chronic kidney disease (CKD), stage II (mild)     stage II to III/notes 07/23/2014  . Gout, arthritis 2014    bil feet. right worse  . Seasonal asthma NO INHALERS    30 years  . CHF (congestive heart failure) ([place]) 2010      Past Surgical History  Procedure Laterality Date  . Cardiac catheterization  03-12-2011  DR Daneen Schick    HYPERTROPHIC CARDIOMYOPATHY WITH LV CAVITY  APPEARANCE CONSISTANT WITH SIGNIFICANT APICAL HYPERTROPHY/ NORMAL LVSF / EF 55%/ EVIDENCE OF DIASTOLIC DYSFUNCTION WITH EDP OF 23-62mmHg AFTER A-WAVE/ NORMAL CORONARY ARTERIES  . Umbilical hernia repair  03/22/2012    Procedure: HERNIA REPAIR UMBILICAL ADULT;  Surgeon: Leighton Ruff, MD;  Location: [facility];  Service: General;  Laterality: N/A;  Umbilical Hernia Repair   . Hernia repair  0000000    Umbilical Hernia Repair  . Irrigation and  debridement abscess N/A 07/27/2013    Procedure: IRRIGATION AND DEBRIDEMENT OF SKIN, SOFT TISSUE AND MUSCLES OF UPPER BACK (11X19X4cm) WITH 10 BLADE AND PULSATILE LAVAGE ;  Surgeon: Gayland Curry, MD;  Location: [place];  Service: General;  Laterality: N/A;  . Insert / replace / remove pacemaker  02/12/2011    SJM implanted by Dr Rayann Heman for Mobitz II second degree AV block and syncope  . Incision and drainage perirectal abscess Left 07/29/2014    Procedure: IRRIGATION AND DEBRIDEMENT PERIRECTAL ABSCESS;  Surgeon: Doreen Salvage, MD;  Location: [place];  Service: General;  Laterality: Left;  Prone position    in for   Chief Complaint  Patient presents with  . Hyperglycemia     HPI  [name]  is a 54 y.o. male, with past medical history significant for diabetes mellitus, history of second-degree AV block status post pacemaker placement history of diastolic cardiac dysfunction with  hypertrophic cardiomyopathy, presenting today with elevated blood sugar. His reading in the emergency room was in the 900s. He reports that he hasn't taken insulin for the last 1 week since he couldn't get the syringes from his primary medical doctor. Patient denies any nausea vomiting ,abdominal pain ,chest pains , or urinary symptoms .    Review of Systems    In addition to the HPI above,  No Fever-chills, No Headache, No changes with Vision or hearing, No problems swallowing food or Liquids, No Chest pain, Cough or Shortness of Breath, No Abdominal pain, No Nausea or Vommitting, Bowel movements are regular, No Blood in stool or Urine, No dysuria, No new skin rashes or bruises, No new joints pains-aches,  No new weakness, tingling, numbness in any extremity, No recent weight gain or loss, No polyuria, polydypsia or polyphagia, No significant Mental Stressors.  A full 10 point Review of Systems was done, except as stated above, all other Review of Systems were negative.   Social History Social History   Substance Use Topics  . Smoking status: Never Smoker   . Smokeless tobacco: Never Used  . Alcohol Use: Yes     Comment: 07/23/2014 "might have a few drinks on holidays or at cookouts"     Family History Family History  Problem Relation Age of Onset  . Colon cancer Neg Hx   . Stroke Mother      Prior to Admission medications   Medication Sig Start Date End Date Taking? Authorizing Provider  ACCU-CHEK FASTCLIX LANCETS MISC TEST 3 TIMES DAILY: BREAKFAST,LUNCH,AND DINNER E10.9 01/29/15  Yes Historical Provider, MD  acetaminophen (TYLENOL) 500 MG tablet Take 500 mg by mouth once. Only taking 1 hour before infusion.  AS premed for Fabrazyme   Yes Historical Provider, MD  Agalsidase beta (FABRAZYME IV) Inject 115 mg into the vein every 14 (fourteen) days.    Yes Historical Provider, MD  ALBUTEROL SULFATE HFA IN Inhale 1 puff into the lungs daily as needed (shortness of breath or wheezing).    Yes Historical Provider, MD  allopurinol (ZYLOPRIM) 300 MG tablet Take 300 mg by mouth daily.   Yes Historical Provider, MD  aspirin 81 MG tablet Take 81 mg by mouth daily.    Yes Historical Provider, MD  colchicine 0.6 MG tablet Take 0.6 mg by mouth daily.   Yes Historical Provider, MD  eplerenone (INSPRA) 50 MG tablet Take 1 tablet (50 mg total) by mouth daily. 04/21/15  Yes Josalyn Funches, MD  fexofenadine (ALLEGRA) 180 MG tablet Take 180 mg by mouth every 14 (fourteen) days.    Yes Historical Provider, MD  insulin aspart protamine- aspart (NOVOLOG MIX 70/30) (70-30) 100 UNIT/ML injection Inject 0.15 mLs (15 Units total) into the skin 2 (two) times daily with a meal. Patient taking differently: Inject 20 Units into the skin 2 (two) times daily with a meal.  04/21/15  Yes Josalyn Funches, MD  Insulin Syringe-Needle U-100 31G X 5/16" 0.5 ML MISC 1 each by Does not apply route 3 (three) times daily. 04/28/15  Yes Josalyn Funches, MD  losartan-hydrochlorothiazide (HYZAAR) 50-12.5 MG per tablet Take 1  tablet by mouth daily. 07/18/14  Yes Historical Provider, MD  meclizine (ANTIVERT) 25 MG tablet Take 25 mg by mouth every 6 (six) hours as needed. 04/09/15  Yes Historical Provider, MD  vitamin C (VITAMIN C) 500 MG tablet Take 1 tablet (500 mg total) by mouth 2 (two) times daily. Patient not taking: Reported on 05/01/2015 08/02/14  Robbie Lis, MD    Allergies  Allergen Reactions  . Shellfish Allergy Hives and Swelling    Physical Exam  Vitals  Blood pressure 113/63, pulse 76, temperature 97.5 F (36.4 C), temperature source Temporal, resp. rate 18, SpO2 97 %.   1. General well-developed, well-nourished male in no acute distress, very pleasant  2. Normal affect and insight, Not Suicidal or Homicidal, Awake Alert, Oriented X 3.  3. No F.N deficits, grossly   4. Ears and Eyes appear Normal, Conjunctivae clear, PERRLA. Moist Oral Mucosa.  5. Supple Neck, No JVD, No cervical lymphadenopathy appriciated, No Carotid Bruits.  6. Symmetrical Chest wall movement, Good air movement bilaterally, CTAB.  7. RRR, No Gallops, Rubs or Murmurs, No Parasternal Heave.  8. Positive Bowel Sounds, Abdomen Soft, obese Non tender, No organomegaly appriciated,No rebound -guarding or rigidity.  9.  No Cyanosis, Normal Skin Turgor, No Skin Rash or Bruise.  10. Good muscle tone,  joints appear normal , no effusions, Normal ROM.    Data Review  CBC No results for input(s): WBC, HGB, HCT, PLT, MCV, MCH, MCHC, RDW, LYMPHSABS, MONOABS, EOSABS, BASOSABS, BANDABS in the last 168 hours.  Invalid input(s): NEUTRABS, BANDSABD ------------------------------------------------------------------------------------------------------------------  Chemistries   Recent Labs Lab 05/01/15 1628  NA 129*  K 4.9  CL 92*  CO2 25  GLUCOSE 984*  BUN 29*  CREATININE 2.27*  CALCIUM 9.3    ------------------------------------------------------------------------------------------------------------------ estimated creatinine clearance is 48 mL/min (by C-G formula based on Cr of 2.27). ------------------------------------------------------------------------------------------------------------------ No results for input(s): TSH, T4TOTAL, T3FREE, THYROIDAB in the last 72 hours.  Invalid input(s): FREET3   Coagulation profile No results for input(s): INR, PROTIME in the last 168 hours. ------------------------------------------------------------------------------------------------------------------- No results for input(s): DDIMER in the last 72 hours. -------------------------------------------------------------------------------------------------------------------  Cardiac Enzymes No results for input(s): CKMB, TROPONINI, MYOGLOBIN in the last 168 hours.  Invalid input(s): CK ------------------------------------------------------------------------------------------------------------------ Invalid input(s): POCBNP   ---------------------------------------------------------------------------------------------------------------  Urinalysis    Component Value Date/Time   COLORURINE YELLOW 05/01/2015 1627   APPEARANCEUR CLEAR 05/01/2015 1627   LABSPEC 1.022 05/01/2015 1627   PHURINE 6.0 05/01/2015 1627   GLUCOSEU >1000* 05/01/2015 1627   HGBUR TRACE* 05/01/2015 [place] 05/01/2015 1627   BILIRUBINUR neg 04/21/2015 [place] 05/01/2015 1627   PROTEINUR NEGATIVE 05/01/2015 1627   PROTEINUR >=300 04/21/2015 1744   UROBILINOGEN 0.2 04/21/2015 1744   UROBILINOGEN 0.2 04/04/2015 1124   NITRITE NEGATIVE 05/01/2015 1627   NITRITE neg 04/21/2015 1744   LEUKOCYTESUR NEGATIVE 05/01/2015 1627    ----------------------------------------------------------------------------------------------------------------   Imaging results:   No  results found.    Assessment & Plan  1. Hyperglycemia/HONK  -due to noncompliance     IV fluids , normal saline     IV insulin for now and then switch to insulin sliding scale  2. History of diastolic congestive heart failure with hypertrophic cardiomyopathy status post pacemaker for Mobitz type II       3. Renal insufficiency , probably due to dehydration caused by hyperglycemia  DVT Prophylaxis Heparin  AM Labs Ordered, also please review Full Orders  Code Status full  Disposition Plan: Home  Time spent in minutes : 32 minutes  Condition GUARDED   @SIGNATURE @

## 2015-05-01 NOTE — ED Notes (Signed)
Called the charge and was told they they will call back because they need to talk to the A,C because of patients blood sugar issues and also told Apolonio Schneiders B to wait till after shift change.

## 2015-05-01 NOTE — ED Notes (Signed)
Pt was visiting a nursing facility to see family today and began feeling bad.  He states his CBG is usually around 220.  He read high today with EMS which means over 600.  Pt did take his Sliding scale insulin this am.  110/82 BP 76P 18RR 96 O2 RA.  Pt also c/o productive cough X 1 month with white/yellow sputum.

## 2015-05-01 NOTE — ED Notes (Signed)
Pt waiting for charge nurse to call back after talking to A.C.

## 2015-05-01 NOTE — ED Provider Notes (Signed)
CSN: [id number]     Arrival date & time 05/01/15  1528 History   First MD Initiated Contact with Patient 05/01/15 1545     Chief Complaint  Patient presents with  . Hyperglycemia   HPI   54 year old male presents today with complaints of nausea and hyperglycemia. Patient reports that he went to see his mom today for Thanksgiving had Thanksgiving dinner shortly after while leaving her facility felt "queasy" as if she was beginning to become nauseous, did not vomit or have any associated abdominal or chest pain. Patient reports he sat down, had his blood sugar checked by nursing staff at the facility and was found to be in the 400s. He reports EMS brought him here at that time. Patient reports a significant past medical history of hyperglycemia, reports that his blood sugar runs in the 2 to 300s normally, reports taking his 7030 insulin this morning as directed on a sliding scale. Patient reports associated fatigue, dehydration. Patient denies any fever, chills, nausea, vomiting, chest pain, shortness of breath, diaphoresis, change in the color clarity or characteristics of his urine. Patient reports that he was seen in September for a viral-like illness, at that time he had upper respiratory congestion that is persistent since that time. He reports the upper respiratory congestion that is resolved with 2-3 forceful coughs.  Past Medical History  Diagnosis Date  . Second degree Mobitz II AV block     with syncope, s/p PPM  . Bifascicular block   . Pacemaker     02/12/11  . Lymphedema of lower extremity LEFT  >  RIGHT    "using ankle high socks at home; pump doesn't work for me" (07/23/2014)  . Short of breath on exertion   . History of cellulitis of skin with lymphangitis LEFT LEG  . Diastolic heart failure secondary to hypertrophic cardiomyopathy ([place]) CARDIOLOGIST-- DR Daneen Schick  . Edema 07/2013  . Chronic bronchitis ([place])     "seasonal; get it q yr"  . Pneumonia 08/2011  . Bell's palsy   .  Type II diabetes mellitus ([place]) 2012    INSULIN DEPENDENT  . Hypertension 20 years  . 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 Chronic kidney disease (CKD), stage II (mild)     stage II to III/notes 07/23/2014  . Gout, arthritis 2014    bil feet. right worse  . Seasonal asthma NO INHALERS    30 years  . CHF (congestive heart failure) ([place]) 2010   Past Surgical History  Procedure Laterality Date  . Cardiac catheterization  03-12-2011  DR Daneen Schick    HYPERTROPHIC CARDIOMYOPATHY WITH LV CAVITY  APPEARANCE CONSISTANT WITH SIGNIFICANT APICAL HYPERTROPHY/ NORMAL LVSF / EF 55%/ EVIDENCE OF DIASTOLIC DYSFUNCTION WITH EDP OF 23-26mmHg AFTER A-WAVE/ NORMAL CORONARY ARTERIES  . Umbilical hernia repair  03/22/2012    Procedure: HERNIA REPAIR UMBILICAL ADULT;  Surgeon: Leighton Ruff, MD;  [facility];  Service: General;  Laterality: N/A;  Umbilical Hernia Repair   . Hernia repair  0000000    Umbilical Hernia Repair  . Irrigation and debridement abscess N/A 07/27/2013    Procedure: IRRIGATION AND DEBRIDEMENT OF SKIN, SOFT TISSUE AND MUSCLES OF UPPER BACK (11X19X4cm) WITH 10 BLADE AND PULSATILE LAVAGE ;  Surgeon: Gayland Curry, MD;  Location: [facility];  Service: General;  Laterality: N/A;  . Insert / replace / remove pacemaker  02/12/2011    SJM implanted by Dr Rayann Heman for Mobitz  II second degree AV block and syncope  . Incision and drainage perirectal abscess Left 07/29/2014    Procedure: IRRIGATION AND DEBRIDEMENT PERIRECTAL ABSCESS;  Surgeon: Doreen Salvage, MD;  Location: [place];  Service: General;  Laterality: Left;  Prone position   Family History  Problem Relation Age of Onset  . Colon cancer Neg Hx   . Stroke Mother    Social History  Substance Use Topics  . Smoking status: Never Smoker   . Smokeless tobacco: Never Used  . Alcohol Use: Yes     Comment: 07/23/2014 "might have a few drinks on holidays or at cookouts"    Review of Systems  All other  systems reviewed and are negative.   Allergies  Shellfish allergy  Home Medications   Prior to Admission medications   Medication Sig Start Date End Date Taking? Authorizing Provider  ACCU-CHEK FASTCLIX LANCETS MISC TEST 3 TIMES DAILY: BREAKFAST,LUNCH,AND DINNER E10.9 01/29/15  Yes Historical Provider, MD  acetaminophen (TYLENOL) 500 MG tablet Take 500 mg by mouth once. Only taking 1 hour before infusion.  AS premed for Fabrazyme   Yes Historical Provider, MD  Agalsidase beta (FABRAZYME IV) Inject 115 mg into the vein every 14 (fourteen) days.    Yes Historical Provider, MD  ALBUTEROL SULFATE HFA IN Inhale 1 puff into the lungs daily as needed (shortness of breath or wheezing).    Yes Historical Provider, MD  allopurinol (ZYLOPRIM) 300 MG tablet Take 300 mg by mouth daily.   Yes Historical Provider, MD  aspirin 81 MG tablet Take 81 mg by mouth daily.    Yes Historical Provider, MD  colchicine 0.6 MG tablet Take 0.6 mg by mouth daily.   Yes Historical Provider, MD  eplerenone (INSPRA) 50 MG tablet Take 1 tablet (50 mg total) by mouth daily. 04/21/15  Yes Josalyn Funches, MD  fexofenadine (ALLEGRA) 180 MG tablet Take 180 mg by mouth every 14 (fourteen) days.    Yes Historical Provider, MD  insulin aspart protamine- aspart (NOVOLOG MIX 70/30) (70-30) 100 UNIT/ML injection Inject 0.15 mLs (15 Units total) into the skin 2 (two) times daily with a meal. Patient taking differently: Inject 20 Units into the skin 2 (two) times daily with a meal.  04/21/15  Yes Josalyn Funches, MD  Insulin Syringe-Needle U-100 31G X 5/16" 0.5 ML MISC 1 each by Does not apply route 3 (three) times daily. 04/28/15  Yes Josalyn Funches, MD  losartan-hydrochlorothiazide (HYZAAR) 50-12.5 MG per tablet Take 1 tablet by mouth daily. 07/18/14  Yes Historical Provider, MD  meclizine (ANTIVERT) 25 MG tablet Take 25 mg by mouth every 6 (six) hours as needed. 04/09/15  Yes Historical Provider, MD  vitamin C (VITAMIN C) 500 MG tablet  Take 1 tablet (500 mg total) by mouth 2 (two) times daily. Patient not taking: Reported on 05/01/2015 08/02/14   Robbie Lis, MD   BP 114/67 mmHg  Pulse 76  Temp(Src) 98.3 F (36.8 C) (Oral)  Resp 18  Ht 5\' 10"  (1.778 m)  Wt 110.1 kg  BMI 34.83 kg/m2  SpO2 99%    Physical Exam  Constitutional: He is oriented to person, place, and time. He appears well-developed and well-nourished.  HENT:  Head: Normocephalic and atraumatic.  Eyes: Conjunctivae are normal. Pupils are equal, round, and reactive to light. Right eye exhibits no discharge. Left eye exhibits no discharge. No scleral icterus.  Neck: Normal range of motion. No JVD present. No tracheal deviation present.  Cardiovascular: Normal rate.  Pulmonary/Chest: Effort normal. No stridor.  Neurological: He is alert and oriented to person, place, and time. Coordination normal.  Psychiatric: He has a normal mood and affect. His behavior is normal. Judgment and thought content normal.  Nursing note and vitals reviewed.   ED Course  Procedures (including critical care time) Labs Review Labs Reviewed  BASIC METABOLIC PANEL - Abnormal; Notable for the following:    Sodium 129 (*)    Chloride 92 (*)    Glucose, Bld 984 (*)    BUN 29 (*)    Creatinine, Ser 2.27 (*)    GFR calc non Af Amer 31 (*)    GFR calc Af Amer 36 (*)    All other components within normal limits  URINALYSIS, ROUTINE W REFLEX MICROSCOPIC (NOT AT [facility]) - Abnormal; Notable for the following:    Glucose, UA >1000 (*)    Hgb urine dipstick TRACE (*)    All other components within normal limits  URINE MICROSCOPIC-ADD ON - Abnormal; Notable for the following:    Squamous Epithelial / LPF 0-5 (*)    Bacteria, UA RARE (*)    All other components within normal limits  CBC - Abnormal; Notable for the following:    WBC 11.9 (*)    RBC 6.04 (*)    Hemoglobin 18.6 (*)    HCT 52.2 (*)    All other components within normal limits  CREATININE, SERUM - Abnormal;  Notable for the following:    Creatinine, Ser 2.10 (*)    GFR calc non Af Amer 34 (*)    GFR calc Af Amer 39 (*)    All other components within normal limits  GLUCOSE, CAPILLARY - Abnormal; Notable for the following:    Glucose-Capillary 472 (*)    All other components within normal limits  GLUCOSE, CAPILLARY - Abnormal; Notable for the following:    Glucose-Capillary 316 (*)    All other components within normal limits  GLUCOSE, CAPILLARY - Abnormal; Notable for the following:    Glucose-Capillary 169 (*)    All other components within normal limits  CBG MONITORING, ED - Abnormal; Notable for the following:    Glucose-Capillary >600 (*)    All other components within normal limits  CBG MONITORING, ED - Abnormal; Notable for the following:    Glucose-Capillary >600 (*)    All other components within normal limits  GLUCOSE, CAPILLARY  BASIC METABOLIC PANEL  CBC    Imaging Review No results found. I have personally reviewed and evaluated these images and lab results as part of my medical decision-making.   EKG Interpretation None      MDM   Final diagnoses:  Hyperglycemia  Acute kidney injury (HCC)    Labs: CBG, BMP, urinalysis- glucose 9084, creatinine 2.27 bicarbonate of 25, negative ketones in the urine  Imaging:  Consults: Hospitalist service  Therapeutics: Regular insulin, normal saline  Discharge Meds:   Assessment/Plan: Patient presents with hyperglycemia, he has no signs or symptoms of hyperosmolar state, DKA. Bicarbonate of 25, negative ketones in the urine, no anion gap, osmolarity of 285. Due to patient's significant elevated blood sugar, patient's new AK he would be admitted to the hospital for further evaluation and management. Patient remained stable while here in the ED.        Okey Regal, PA-C 05/02/15 [id number]  Blanchie Dessert, MD 05/03/15 [id number]

## 2015-05-01 NOTE — ED Notes (Signed)
Bed: [id number] Expected date:  Expected time:  Means of arrival:  Comments: Ems-malaise

## 2015-05-02 DIAGNOSIS — R739 Hyperglycemia, unspecified: Secondary | ICD-10-CM

## 2015-05-02 LAB — BASIC METABOLIC PANEL
Anion gap: 11 (ref 5–15)
Anion gap: 11 (ref 5–15)
BUN: 28 mg/dL — ABNORMAL HIGH (ref 6–20)
BUN: 25 mg/dL — ABNORMAL HIGH (ref 6–20)
CO2: 27 mmol/L (ref 22–32)
CO2: 25 mmol/L (ref 22–32)
Calcium: 9.2 mg/dL (ref 8.9–10.3)
Calcium: 8.7 mg/dL — ABNORMAL LOW (ref 8.9–10.3)
Chloride: 105 mmol/L (ref 101–111)
Chloride: 104 mmol/L (ref 101–111)
Creatinine, Ser: 1.97 mg/dL — ABNORMAL HIGH (ref 0.61–1.24)
Creatinine, Ser: 1.84 mg/dL — ABNORMAL HIGH (ref 0.61–1.24)
GFR calc Af Amer: 43 mL/min — ABNORMAL LOW (ref >60)
GFR calc Af Amer: 46 mL/min — ABNORMAL LOW (ref >60)
GFR calc non Af Amer: 37 mL/min — ABNORMAL LOW (ref >60)
GFR calc non Af Amer: 40 mL/min — ABNORMAL LOW (ref >60)
Glucose, Bld: 110 mg/dL — ABNORMAL HIGH (ref 65–99)
Glucose, Bld: 186 mg/dL — ABNORMAL HIGH (ref 65–99)
Potassium: 4.1 mmol/L (ref 3.5–5.1)
Potassium: 5.1 mmol/L (ref 3.5–5.1)
Sodium: 143 mmol/L (ref 135–145)
Sodium: 140 mmol/L (ref 135–145)

## 2015-05-02 LAB — CBC
HCT: 45.9 % (ref 39.0–52.0)
Hemoglobin: 15.8 g/dL (ref 13.0–17.0)
MCH: 30.1 pg (ref 26.0–34.0)
MCHC: 34.4 g/dL (ref 30.0–36.0)
MCV: 87.4 fL (ref 78.0–100.0)
Platelets: 246 10*3/uL (ref 150–400)
RBC: 5.25 MIL/uL (ref 4.22–5.81)
RDW: 12.4 % (ref 11.5–15.5)
WBC: 11.8 10*3/uL — ABNORMAL HIGH (ref 4.0–10.5)

## 2015-05-02 LAB — GLUCOSE, CAPILLARY
Glucose-Capillary: 117 mg/dL — ABNORMAL HIGH (ref 65–99)
Glucose-Capillary: 122 mg/dL — ABNORMAL HIGH (ref 65–99)
Glucose-Capillary: 132 mg/dL — ABNORMAL HIGH (ref 65–99)
Glucose-Capillary: 219 mg/dL — ABNORMAL HIGH (ref 65–99)
Glucose-Capillary: 71 mg/dL (ref 65–99)
Glucose-Capillary: 93 mg/dL (ref 65–99)

## 2015-05-02 MED ORDER — INSULIN ASPART 100 UNIT/ML ~~LOC~~ SOLN: 0.0000 [IU] | Freq: Every day | SUBCUTANEOUS | Status: DC

## 2015-05-02 MED ORDER — INSULIN ASPART 100 UNIT/ML ~~LOC~~ SOLN
0.0000 [IU] | Freq: Three times a day (TID) | SUBCUTANEOUS | Status: DC
  Administered 2015-05-02: 2 [IU] via SUBCUTANEOUS
  Administered 2015-05-02: 5 [IU] via SUBCUTANEOUS

## 2015-05-02 MED ORDER — INSULIN ASPART PROT & ASPART (70-30 MIX) 100 UNIT/ML ~~LOC~~ SUSP
20.0000 [IU] | Freq: Two times a day (BID) | SUBCUTANEOUS | Status: DC
  Administered 2015-05-02 – 2015-05-03 (×4): 20 [IU] via SUBCUTANEOUS
  Filled 2015-05-02: qty 10

## 2015-05-02 MED ORDER — SODIUM CHLORIDE 0.9 % IV SOLN
INTRAVENOUS | Status: DC
  Administered 2015-05-02 (×2): via INTRAVENOUS

## 2015-05-02 NOTE — Care Management Note (Signed)
Case Management Note  Patient Details  Name: [name] MRN: [id number] Date of Birth: July 18, 1960  Subjective/Objective:   54 y/o m admitted w/hyperglycemia w/hyperglycemia.From home.                 Action/Plan:d/c plan home.   Expected Discharge Date:                  Expected Discharge Plan:  Home/Self Care  In-House Referral:     Discharge planning Services  CM Consult  Post Acute Care Choice:    Choice offered to:     DME Arranged:    DME Agency:     HH Arranged:    HH Agency:     Status of Service:  In process, will continue to follow  Medicare Important Message Given:    Date Medicare IM Given:    Medicare IM give by:    Date Additional Medicare IM Given:    Additional Medicare Important Message give by:     If discussed at [place] of Stay Meetings, dates discussed:    Additional Comments:  Dessa Phi, RN 05/02/2015, 3:15 PM

## 2015-05-02 NOTE — Progress Notes (Signed)
Inpatient Diabetes Program Recommendations  AACE/ADA: New Consensus Statement on Inpatient Glycemic Control (2015)  Target Ranges:  Prepandial:   less than 140 mg/dL      Peak postprandial:   less than 180 mg/dL (1-2 hours)      Critically ill patients:  140 - 180 mg/dL   Review of Glycemic Control  Inpatient Diabetes Program Recommendations:    Added carbohydrate modified to existing Heart Healthy orders with co-sign required.  Thank you Rosita Kea, RN, MSN, CDE  Diabetes Inpatient Program Office: [phone] Pager: [phone] 8:00 am to 5:00 pm

## 2015-05-02 NOTE — Progress Notes (Signed)
Report given per April, RN, Agree with previous nurse assessment

## 2015-05-02 NOTE — Progress Notes (Signed)
[facility] PROGRESS NOTE  [name] [id number] DOB: 15-Jan-1961 DOA: 05/01/2015 PCP: Minerva Ends, MD  Assessment/Plan: 1. HONK/ Hyperglycemia, with cbg's in upper 900's. -he was starte Research officer, political party and later on changed to sq insulin.  CBG (last 3)   Recent Labs  05/02/15 0719 05/02/15 1130 05/02/15 1639  GLUCAP 117* 132* 219*    Resume SSI.   2. AKI: Gentle hydration. Repeat in am.   Code Status: full code.  Family Communication: none at bedside,  Disposition Plan: d/cin am after renal parameters improve.    Consultants: none Procedures:  none  Antibiotics:  none  HPI/Subjective: Appears frustrated that he is in the hospital.   Objective: Filed Vitals:   05/02/15 0451 05/02/15 1358  BP: 109/66 138/83  Pulse: 70 73  Temp: 98 F (36.7 C) 98.3 F (36.8 C)  Resp: 12 16    Intake/Output Summary (Last 24 hours) at 05/02/15 1625 Last data filed at 05/02/15 1300  Gross per 24 hour  Intake 2477.91 ml  Output    800 ml  Net 1677.91 ml   Filed Weights   05/01/15 1933  Weight: 110.1 kg (242 lb 11.6 oz)    Exam:   General:  Alert comfortable.   Cardiovascular: s1s2  Respiratory: ctab  Abdomen: soft NT NDBS+  Musculoskeletal: no pedal edema.   Data Reviewed: Basic Metabolic Panel:  Recent Labs Lab 05/01/15 1628 05/01/15 2114 05/02/15 0445 05/02/15 1426  NA 129*  --  143 140  K 4.9  --  4.1 5.1  CL 92*  --  105 104  CO2 25  --  27 25  GLUCOSE 984*  --  110* 186*  BUN 29*  --  28* 25*  CREATININE 2.27* 2.10* 1.97* 1.84*  CALCIUM 9.3  --  9.2 8.7*   Liver Function Tests: No results for input(s): AST, ALT, ALKPHOS, BILITOT, PROT, ALBUMIN in the last 168 hours. No results for input(s): LIPASE, AMYLASE in the last 168 hours. No results for input(s): AMMONIA in the last 168 hours. CBC:  Recent Labs Lab 05/01/15 2114 05/02/15 0445  WBC 11.9* 11.8*  HGB 18.6* 15.8  HCT 52.2* 45.9  MCV 86.4 87.4  PLT  229 246   Cardiac Enzymes: No results for input(s): CKTOTAL, CKMB, CKMBINDEX, TROPONINI in the last 168 hours. BNP (last 3 results) No results for input(s): BNP in the last 8760 hours.  ProBNP (last 3 results) No results for input(s): PROBNP in the last 8760 hours.  CBG:  Recent Labs Lab 05/01/15 2251 05/01/15 2328 05/02/15 0052 05/02/15 0719 05/02/15 1130  GLUCAP 87 71 93 117* 132*    No results found for this or any previous visit (from the past 240 hour(s)).   Studies: No results found.  Scheduled Meds: . allopurinol  300 mg Oral Daily  . aspirin EC  81 mg Oral Daily  . heparin  5,000 Units Subcutaneous 3 times per day  . insulin aspart  0-15 Units Subcutaneous TID WC  . insulin aspart  0-5 Units Subcutaneous QHS  . insulin aspart protamine- aspart  20 Units Subcutaneous BID WC  . loratadine  10 mg Oral Daily   Continuous Infusions: . sodium chloride 75 mL/hr at 05/02/15 0130    Active Problems:   Hyperglycemia    Time spent: 25 minutes.     [facility] Pager [phone] . If 7PM-7AM, please contact night-coverage at [email or web address], password [facility] 05/02/2015, 4:25 PM  LOS: 1 day

## 2015-05-03 LAB — BASIC METABOLIC PANEL
Anion gap: 7 (ref 5–15)
BUN: 23 mg/dL — ABNORMAL HIGH (ref 6–20)
CO2: 23 mmol/L (ref 22–32)
Calcium: 8.4 mg/dL — ABNORMAL LOW (ref 8.9–10.3)
Chloride: 110 mmol/L (ref 101–111)
Creatinine, Ser: 1.43 mg/dL — ABNORMAL HIGH (ref 0.61–1.24)
GFR calc Af Amer: >60 mL/min (ref >60)
GFR calc non Af Amer: 54 mL/min — ABNORMAL LOW (ref >60)
Glucose, Bld: 233 mg/dL — ABNORMAL HIGH (ref 65–99)
Potassium: 3.7 mmol/L (ref 3.5–5.1)
Sodium: 140 mmol/L (ref 135–145)

## 2015-05-03 LAB — GLUCOSE, CAPILLARY: Glucose-Capillary: 72 mg/dL (ref 65–99)

## 2015-05-03 MED ORDER — "INSULIN SYRINGE-NEEDLE U-100 31G X 5/16"" 0.5 ML MISC": 1.0000 | Freq: Three times a day (TID) | Status: DC

## 2015-05-03 MED ORDER — LOSARTAN POTASSIUM-HCTZ 50-12.5 MG PO TABS: 1.0000 | ORAL_TABLET | Freq: Every day | ORAL | Status: DC

## 2015-05-03 MED ORDER — INSULIN ASPART 100 UNIT/ML ~~LOC~~ SOLN: 0.0000 [IU] | Freq: Three times a day (TID) | SUBCUTANEOUS | Status: DC

## 2015-05-03 MED ORDER — INSULIN ASPART PROT & ASPART (70-30 MIX) 100 UNIT/ML ~~LOC~~ SUSP
20.0000 [IU] | Freq: Two times a day (BID) | SUBCUTANEOUS | Status: DC
Start: 2015-05-03 — End: 2015-05-03

## 2015-05-03 MED ORDER — INSULIN ASPART PROT & ASPART (70-30 MIX) 100 UNIT/ML ~~LOC~~ SUSP: 20.0000 [IU] | Freq: Two times a day (BID) | SUBCUTANEOUS | Status: DC

## 2015-05-03 NOTE — Progress Notes (Signed)
Patient given discharge, medication and f/u instructions by this nurse, as well as medication instructions and prescriptions per attending MD, verbalized understanding, IV and telemetry removed, pt is supplying his own transportation home.

## 2015-05-04 NOTE — Progress Notes (Addendum)
Cardiology Office Note   Date:  05/05/2015   ID:  [name], DOB 10/19/1960, MRN [id number]   Patient Care Team: Boykin Nearing, MD as PCP - General (Family Medicine) Belva Crome, MD as Consulting Physician (Cardiology) Thompson Grayer, MD as Consulting Physician (Clinical Cardiac Electrophysiology) Donato Heinz, MD as Consulting Physician (Nephrology)    Chief Complaint  Patient presents with  . Surgical Clearance     History of Present Illness: [name] is a 54 y.o. 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   Last seen by Dr. Daneen Schick 11/15.  Last seen by Dr. Thompson Grayer in 12/15.  His pacemaker was adjusted at that time due to exertional SOB.    He presents today for surgical clearance.  He has an anorectal fistula and needs a fistulotomy under general anesthesia with Dr. Judeth Horn.  Date is TBD.  He has chronic edema without change.  He notes chronic DOE with mild to mod activities.  He feels like his DOE is overall stable.  He denies chest pain or syncope. Denies orthopnea, PND.  Denies coughing, wheezing.  Denies any bleeding problems.     Studies/Reports Reviewed Today:  Echo 07/29/13 Mod LVH, ? Infiltrative disease, LVOT gradient 22 mmHg, no SAM, EF 60-65%, no RWMA, Gr 2 DD, aortic sclerosis, MAC, mild LAE, normal RVSF, PASP 29 mmHg   Past Medical History  Diagnosis Date  . Second degree Mobitz II AV block     with syncope, s/p PPM  . Bifascicular block   . Pacemaker     02/12/11  . Lymphedema of lower extremity LEFT  >  RIGHT    "using ankle high socks at home; pump doesn't work for me" (07/23/2014)  . Short of breath on exertion   . History of cellulitis of skin with lymphangitis LEFT LEG  . Diastolic heart failure secondary to hypertrophic cardiomyopathy ([place]) CARDIOLOGIST-- DR Daneen Schick  . Edema 07/2013  . Chronic bronchitis ([place])    "seasonal; get it q yr"  . Pneumonia 08/2011  . Bell's palsy   . Type II diabetes mellitus ([place]) 2012    INSULIN DEPENDENT  . Hypertension 20 years  . Fabry's disease ([place]) RENAL AND CARDIAC INVOVLEMENT    FOLLOWED DR [place]O  . Chronic kidney disease (CKD), stage II (mild)     stage II to III/notes 07/23/2014  . Gout, arthritis 2014    bil feet. right worse  . Seasonal asthma NO INHALERS    30 years  . CHF (congestive heart failure) ([place]) 2010    Past Surgical History  Procedure Laterality Date  . Cardiac catheterization  03-12-2011  DR Daneen Schick    HYPERTROPHIC CARDIOMYOPATHY WITH LV CAVITY  APPEARANCE CONSISTANT WITH SIGNIFICANT APICAL HYPERTROPHY/ NORMAL LVSF / EF 55%/ EVIDENCE OF DIASTOLIC DYSFUNCTION WITH EDP OF 23-73mmHg AFTER A-WAVE/ NORMAL CORONARY ARTERIES  . Umbilical hernia repair  03/22/2012    Procedure: HERNIA REPAIR UMBILICAL ADULT;  Surgeon: Leighton Ruff, MD;  Location: [facility];  Service: General;  Laterality: N/A;  Umbilical Hernia Repair   . Hernia repair  0000000    Umbilical Hernia Repair  . Irrigation and debridement abscess N/A 07/27/2013    Procedure: IRRIGATION AND DEBRIDEMENT OF SKIN, SOFT TISSUE AND MUSCLES OF UPPER BACK (11X19X4cm) WITH 10 BLADE AND PULSATILE LAVAGE ;  Surgeon: Gayland Curry, MD;  Location: [place] [facility];  Service: General;  Laterality: N/A;  . Insert / replace / remove pacemaker  02/12/2011    SJM implanted by Dr Rayann Heman for Mobitz II second degree AV block and syncope  . Incision and drainage perirectal abscess Left 07/29/2014    Procedure: IRRIGATION AND DEBRIDEMENT PERIRECTAL ABSCESS;  Surgeon: Doreen Salvage, MD;  Location: [facility];  Service: General;  Laterality: Left;  Prone position     Current Outpatient Prescriptions  Medication Sig Dispense Refill  . ACCU-CHEK FASTCLIX LANCETS MISC TEST 3 TIMES DAILY: BREAKFAST,LUNCH,AND DINNER E10.9  4  . acetaminophen (TYLENOL) 500 MG tablet Take 500 mg by mouth once. Only taking 1 hour before infusion.  AS  premed for Fabrazyme    . Agalsidase beta (FABRAZYME IV) Inject 115 mg into the vein every 14 (fourteen) days.     . ALBUTEROL SULFATE HFA IN Inhale 1 puff into the lungs daily as needed (shortness of breath or wheezing).     [name] Kitchen allopurinol (ZYLOPRIM) 300 MG tablet Take 300 mg by mouth daily.    [name] Kitchen aspirin 81 MG tablet Take 81 mg by mouth daily.     . colchicine 0.6 MG tablet Take 0.6 mg by mouth daily.    [name] Kitchen eplerenone (INSPRA) 50 MG tablet Take 1 tablet (50 mg total) by mouth daily. 30 tablet 5  . fexofenadine (ALLEGRA) 180 MG tablet Take 180 mg by mouth every 14 (fourteen) days.     . insulin aspart (NOVOLOG) 100 UNIT/ML injection Inject 0-15 Units into the skin 3 (three) times daily with meals. 10 mL 11  . insulin aspart protamine- aspart (NOVOLOG MIX 70/30) (70-30) 100 UNIT/ML injection Inject 0.2 mLs (20 Units total) into the skin 2 (two) times daily with a meal. 10 mL 1  . Insulin Syringe-Needle U-100 31G X 5/16" 0.5 ML MISC 1 each by Does not apply route 3 (three) times daily. 100 each 11  . losartan-hydrochlorothiazide (HYZAAR) 50-12.5 MG tablet Take 1 tablet by mouth daily.  6  . meclizine (ANTIVERT) 25 MG tablet Take 25 mg by mouth every 6 (six) hours as needed for dizziness or nausea.   1  . [DISCONTINUED] furosemide (LASIX) 20 MG tablet Take 80 mg by mouth daily.      No current facility-administered medications for this visit.    Allergies:   Shellfish allergy    Social History:   Social History   Social History  . Marital Status: Divorced    Spouse Name: N/A  . Number of Children: N/A  . Years of Education: N/A   Social History Main Topics  . Smoking status: Never Smoker   . Smokeless tobacco: Never Used  . Alcohol Use: Yes     Comment: 07/23/2014 "might have a few drinks on holidays or at cookouts"  . Drug Use: No  . Sexual Activity: Yes   Other Topics Concern  . None   Social History Narrative   Coaches [employer]      Family History:   Family History   Problem Relation Age of Onset  . Colon cancer Neg Hx   . Stroke Mother       ROS:   Please see the history of present illness.   Review of Systems  Constitution: Positive for malaise/fatigue.  Cardiovascular: Positive for dyspnea on exertion and leg swelling.  Respiratory: Positive for shortness of breath and wheezing.   All other systems reviewed and are negative.     PHYSICAL EXAM: VS:  BP 110/84 mmHg  Pulse  70  Ht 5\' 10"  (1.778 m)  Wt 263 lb 6.4 oz (119.477 kg)  BMI 37.79 kg/m2    Wt Readings from Last 3 Encounters:  05/05/15 263 lb 6.4 oz (119.477 kg)  05/05/15 256 lb 3.2 oz (116.212 kg)  05/01/15 242 lb 11.6 oz (110.1 kg)     GEN: Well nourished, well developed, in no acute distress HEENT: normal Neck: no JVD,   no masses Cardiac:  Normal S1/S2, RRR; no murmur ,  no rubs or gallops, 1-2+ bilateral non-pitting LE edema   Respiratory:  Decreased breath sounds bilaterally, no wheezing, rhonchi or rales. GI: soft, nontender, nondistended, + BS MS: no deformity or atrophy Skin: warm and dry  Neuro:  CNs II-XII intact, Strength and sensation are intact Psych: Normal affect   EKG:  EKG is ordered today.  It demonstrates:   AV paced, HR 70   Recent Labs: 05/02/2015: Hemoglobin 15.8; Platelets 246 05/05/2015: ALT 32; BUN 16; Creatinine, Ser 1.29*; Potassium 3.7; Sodium 139    Lipid Panel    Component Value Date/Time   CHOL 241* 03/21/2015 0820   TRIG 177* 03/21/2015 0820   HDL 62 03/21/2015 0820   CHOLHDL 3.9 03/21/2015 0820   VLDL 35 03/21/2015 0820   LDLCALC 144* 03/21/2015 0820      ASSESSMENT AND PLAN:  1. Surgical Clearance:  He does not have any unstable cardiac conditions.  He apparently had a heart cath in 2012.  His chart indicates the heart cath demonstrated normal coronary arteries.  However, I cannot locate the official cath report in his chart. I reviewed his case with Dr. Dorris Carnes (DOD) today.  He does not require further ischemic testing.   However, given his hypertrophic cardiomyopathy with mod diastolic dysfunction and prior LVOT gradient of 22 mmHg, we will arrange a FU echo to reassess his LVOT gradient.  Close attention will need to be paid to his volume status at the time of his surgery.  Care should be taken to avoid hypotension, volume overload and over-diuresis.  Our service is available as necessary.  As far as Aspirin goes, it would be acceptable to hold his ASA if needed prior to surgery and resume it when felt to be safe.   I will follow up with his surgeon after his echo results are obtained.    2. Chronic Diastolic CHF:  Volume appears stable.  As noted above, he will need caution with his volume status at the time of his surgery.   3. HTN:  Controlled.   4. CKD:  Followed by Nephrology.   5. Fabry Disease:  He has evidence of renal and cardiac involvement.    6. S/p Pacemaker:  FU with EP as planned.   7. Shortness of Breath:  I suspect this is multifactorial and related to his HCM, diastolic HF, obesity.  Repeat echo as noted.  Keep FU with Dr. Daneen Schick in 06/2015.  Question if CPX testing would be beneficial.  Will leave this up to Dr.Smith.       Medication Changes: Current medicines are reviewed at length with the patient today.  Concerns regarding medicines are as outlined above.  The following changes have been made:   Discontinued Medications   INSULIN ASPART (NOVOLOG) 100 UNIT/ML INJECTION    Inject 15 Units into the skin 3 (three) times daily before meals.   VITAMIN C (VITAMIN C) 500 MG TABLET    Take 1 tablet (500 mg total) by mouth 2 (two) times daily.  Modified Medications   No medications on file   New Prescriptions   No medications on file   Labs/ tests ordered today include:   Orders Placed This Encounter  Procedures  . EKG 12-Lead  . Echocardiogram     Disposition:    FU with Dr. Daneen Schick 06/2015 as planned.     Signed, Versie Starks, MHS 05/05/2015 3:36 PM    [facility] [address], [place], [place]  [address] Phone: [phone]; Fax: [phone]

## 2015-05-05 ENCOUNTER — Encounter: Payer: Self-pay | Admitting: Physician Assistant

## 2015-05-05 ENCOUNTER — Encounter (HOSPITAL_COMMUNITY)
Admission: RE | Admit: 2015-05-05 | Discharge: 2015-05-05 | Disposition: A | Discharge status: Home / Self Care | Payer: Medicare Other | Source: Ambulatory Visit | Attending: Nephrology | Admitting: Nephrology

## 2015-05-05 ENCOUNTER — Ambulatory Visit (INDEPENDENT_AMBULATORY_CARE_PROVIDER_SITE_OTHER): Payer: Medicare Other | Admitting: Physician Assistant

## 2015-05-05 VITALS — BP 110/84 | HR 70 | Ht 70.0 in | Wt 263.4 lb

## 2015-05-05 DIAGNOSIS — Z950 Presence of cardiac pacemaker: Secondary | ICD-10-CM

## 2015-05-05 DIAGNOSIS — R0602 Shortness of breath: Secondary | ICD-10-CM

## 2015-05-05 DIAGNOSIS — I10 Essential (primary) hypertension: Secondary | ICD-10-CM | POA: Diagnosis not present

## 2015-05-05 DIAGNOSIS — Z01810 Encounter for preprocedural cardiovascular examination: Secondary | ICD-10-CM

## 2015-05-05 DIAGNOSIS — E1101 Type 2 diabetes mellitus with hyperosmolarity with coma: Secondary | ICD-10-CM | POA: Diagnosis not present

## 2015-05-05 DIAGNOSIS — N183 Chronic kidney disease, stage 3 (moderate): Secondary | ICD-10-CM

## 2015-05-05 DIAGNOSIS — E7521 Fabry (-Anderson) disease: Secondary | ICD-10-CM

## 2015-05-05 DIAGNOSIS — I5032 Chronic diastolic (congestive) heart failure: Secondary | ICD-10-CM | POA: Diagnosis not present

## 2015-05-05 DIAGNOSIS — I421 Obstructive hypertrophic cardiomyopathy: Secondary | ICD-10-CM

## 2015-05-05 LAB — URINALYSIS W MICROSCOPIC (NOT AT ARMC)
Bilirubin Urine: NEGATIVE
Glucose, UA: NEGATIVE mg/dL
Ketones, ur: NEGATIVE mg/dL
Leukocytes, UA: NEGATIVE
Nitrite: NEGATIVE
Protein, ur: 100 mg/dL — AB
Specific Gravity, Urine: 1.014 (ref 1.005–1.030)
pH: 5.5 (ref 5.0–8.0)

## 2015-05-05 LAB — COMPREHENSIVE METABOLIC PANEL
ALT: 32 U/L (ref 17–63)
AST: 41 U/L (ref 15–41)
Albumin: 3.1 g/dL — ABNORMAL LOW (ref 3.5–5.0)
Alkaline Phosphatase: 90 U/L (ref 38–126)
Anion gap: 5 (ref 5–15)
BUN: 16 mg/dL (ref 6–20)
CO2: 25 mmol/L (ref 22–32)
Calcium: 8.7 mg/dL — ABNORMAL LOW (ref 8.9–10.3)
Chloride: 109 mmol/L (ref 101–111)
Creatinine, Ser: 1.29 mg/dL — ABNORMAL HIGH (ref 0.61–1.24)
GFR calc Af Amer: >60 mL/min (ref >60)
GFR calc non Af Amer: >60 mL/min (ref >60)
Glucose, Bld: 240 mg/dL — ABNORMAL HIGH (ref 65–99)
Potassium: 3.7 mmol/L (ref 3.5–5.1)
Sodium: 139 mmol/L (ref 135–145)
Total Bilirubin: 0.3 mg/dL (ref 0.3–1.2)
Total Protein: 7.2 g/dL (ref 6.5–8.1)

## 2015-05-05 LAB — GLUCOSE, CAPILLARY: Glucose-Capillary: 210 mg/dL — ABNORMAL HIGH (ref 65–99)

## 2015-05-05 LAB — PHOSPHORUS: Phosphorus: 3.1 mg/dL (ref 2.5–4.6)

## 2015-05-05 MED ORDER — SODIUM CHLORIDE 0.9 % IV SOLN
115.0000 mg | INTRAVENOUS | Status: AC
  Administered 2015-05-05: 115 mg via INTRAVENOUS
  Filled 2015-05-05: qty 21

## 2015-05-05 MED ORDER — SODIUM CHLORIDE 0.9 % IV SOLN
INTRAVENOUS | Status: AC
  Administered 2015-05-05: 250 mL via INTRAVENOUS

## 2015-05-05 NOTE — Discharge Summary (Signed)
Physician Discharge Summary  [name] [id number] DOB: 06-Feb-1961 DOA: 05/01/2015  PCP: [name] Ends, MD  Admit date: 05/01/2015 Discharge date: 05/03/2015  Time spent: 25 minutes  Recommendations for Outpatient Follow-up:  1. Follow up with PCP in one week.    Discharge Diagnoses:  Active Problems:   Hyperglycemia uncontrolled DM.  HYPERTENSION.  Discharge Condition: improved  Diet recommendation: carb modified diet.   Filed Weights   05/01/15 1933  Weight: 110.1 kg (242 lb 11.6 oz)    History of present illness:  [name] is a 54 y.o. male, with past medical history significant for diabetes mellitus, history of second-degree AV block status post pacemaker placement history of diastolic cardiac dysfunction with hypertrophic cardiomyopathy, presenting today with elevated blood sugar. His reading in the emergency room was in the 900s.  He reports missing his insulin dose because of lack of syringes.  His CBG's ARE well controlled on discharge.   Hospital Course:  1. HONK/ Hyperglycemia, with cbg's in upper 900's. -he was started on glucostabilizer and later on changed to sq insulin. His hgba1c is 10 CBG (last 3)   Recent Labs  05/02/15 2047 05/03/15 0803 05/05/15 0920  GLUCAP 122* 72 210*   Resume home regimen.    2. AKI: Gentle hydration. Repeat in am shows much improvement. Recommended to hold hyzaar for a week before restarting.  Follow up with PCP in one week.        Procedures:  none  Consultations:  DM co ordinator.   Discharge Exam: Filed Vitals:   05/02/15 2048 05/03/15 0513  BP: 130/89 144/67  Pulse: 78 76  Temp: 99 F (37.2 C) 98.2 F (36.8 C)  Resp: 16 16    General: alert afebrile comfortable.  Cardiovascular: s1s2 Respiratory: ctab  Discharge Instructions   Discharge Instructions    Diet Carb Modified    Complete by:  As directed      Discharge instructions    Complete by:  As directed   Follow up  with PCP as needed.  Please hold hyzaar for one week, because of acute renal failure.          Discharge Medication List as of 05/05/2015  9:24 AM    START taking these medications   Details  insulin aspart (NOVOLOG) 100 UNIT/ML injection Inject 0-15 Units into the skin 3 (three) times daily with meals., Starting 05/03/2015, Until Discontinued, Print      CONTINUE these medications which have CHANGED   Details  insulin aspart protamine- aspart (NOVOLOG MIX 70/30) (70-30) 100 UNIT/ML injection Inject 0.2 mLs (20 Units total) into the skin 2 (two) times daily with a meal., Starting 05/03/2015, Until Discontinued, Print    Insulin Syringe-Needle U-100 31G X 5/16" 0.5 ML MISC 1 each by Does not apply route 3 (three) times daily., Starting 05/03/2015, Until Discontinued, Print    losartan-hydrochlorothiazide (HYZAAR) 50-12.5 MG tablet Take 1 tablet by mouth daily., Starting 05/03/2015, Until Discontinued, No Print      CONTINUE these medications which have NOT CHANGED   Details  ACCU-CHEK FASTCLIX LANCETS MISC TEST 3 TIMES DAILY: BREAKFAST,LUNCH,AND DINNER E10.9, Historical Med    acetaminophen (TYLENOL) 500 MG tablet Take 500 mg by mouth once. Only taking 1 hour before infusion.  AS premed for Fabrazyme, Historical Med    Agalsidase beta (FABRAZYME IV) Inject 115 mg into the vein every 14 (fourteen) days. , Until Discontinued, Historical Med    ALBUTEROL SULFATE HFA IN Inhale 1 puff into the  lungs daily as needed (shortness of breath or wheezing). , Until Discontinued, Historical Med    allopurinol (ZYLOPRIM) 300 MG tablet Take 300 mg by mouth daily., Until Discontinued, Historical Med    aspirin 81 MG tablet Take 81 mg by mouth daily. , Until Discontinued, Historical Med    colchicine 0.6 MG tablet Take 0.6 mg by mouth daily., Until Discontinued, Historical Med    eplerenone (INSPRA) 50 MG tablet Take 1 tablet (50 mg total) by mouth daily., Starting 04/21/2015, Until Discontinued,  Normal    fexofenadine (ALLEGRA) 180 MG tablet Take 180 mg by mouth every 14 (fourteen) days. , Until Discontinued, Historical Med    meclizine (ANTIVERT) 25 MG tablet Take 25 mg by mouth every 6 (six) hours as needed., Starting 04/09/2015, Until Discontinued, Historical Med    vitamin C (VITAMIN C) 500 MG tablet Take 1 tablet (500 mg total) by mouth 2 (two) times daily., Starting 08/02/2014, Until Discontinued, Print       Allergies  Allergen Reactions  . Shellfish Allergy Hives and Swelling   Follow-up Information    Follow up with [name] Ends, MD.   Specialty:  Family Medicine   Why:  As needed   Contact information:   [place] [address] [phone]        The results of significant diagnostics from this hospitalization (including imaging, microbiology, ancillary and laboratory) are listed below for reference.    Significant Diagnostic Studies: No results found.  Microbiology: No results found for this or any previous visit (from the past 240 hour(s)).   Labs: Basic Metabolic Panel:  Recent Labs Lab 05/01/15 1628 05/01/15 2114 05/02/15 0445 05/02/15 1426 05/03/15 1006 05/05/15 0915  NA 129*  --  143 140 140 139  K 4.9  --  4.1 5.1 3.7 3.7  CL 92*  --  105 104 110 109  CO2 25  --  27 25 23 25   GLUCOSE 984*  --  110* 186* 233* 240*  BUN 29*  --  28* 25* 23* 16  CREATININE 2.27* 2.10* 1.97* 1.84* 1.43* 1.29*  CALCIUM 9.3  --  9.2 8.7* 8.4* 8.7*  PHOS  --   --   --   --   --  3.1   Liver Function Tests:  Recent Labs Lab 05/05/15 0915  AST 41  ALT 32  ALKPHOS 90  BILITOT 0.3  PROT 7.2  ALBUMIN 3.1*   No results for input(s): LIPASE, AMYLASE in the last 168 hours. No results for input(s): AMMONIA in the last 168 hours. CBC:  Recent Labs Lab 05/01/15 2114 05/02/15 0445  WBC 11.9* 11.8*  HGB 18.6* 15.8  HCT 52.2* 45.9  MCV 86.4 87.4  PLT 229 246   Cardiac Enzymes: No results for input(s): CKTOTAL, CKMB, CKMBINDEX,  TROPONINI in the last 168 hours. BNP: BNP (last 3 results) No results for input(s): BNP in the last 8760 hours.  ProBNP (last 3 results) No results for input(s): PROBNP in the last 8760 hours.  CBG:  Recent Labs Lab 05/02/15 1130 05/02/15 1639 05/02/15 2047 05/03/15 0803 05/05/15 0920  GLUCAP 132* 219* 122* 72 210*       Signed:  Kentley Blyden  [facility] 05/05/2015, 9:58 AM

## 2015-05-05 NOTE — Telephone Encounter (Signed)
Left voice message  Forms ready to be pickup

## 2015-05-05 NOTE — Patient Instructions (Addendum)
  Medication Instructions:  Your physician recommends that you continue on your current medications as directed. Please refer to the Current Medication list given to you today.   Labwork: NONE  Testing/Procedures: Your physician has requested that you have an echocardiogram ASAP. Echocardiography is a painless test that uses sound waves to create images of your heart. It provides your doctor with information about the size and shape of your heart and how well your heart's chambers and valves are working. This procedure takes approximately one hour. There are no restrictions for this procedure.   Follow-Up: KEEP APPT WITH DR/ [name] IN 06/2015  Any Other Special Instructions Will Be Listed Below (If Applicable).   If you need a refill on your cardiac medications before your next appointment, please call your pharmacy.

## 2015-05-05 NOTE — Progress Notes (Signed)
Pt awakened from nap. Sleepy but able to ambuated in room and to bathroom for UA to be collected. Pt states he will go home check his CBG and administer his sliding scale insulin if needed then eat lunch prior to his scheduled appointment with Dr Hulen Skains at 1400 today. Pt discharged ambulatory steady gait  with cane to elevator.

## 2015-05-05 NOTE — Discharge Instructions (Signed)
Per Diabetes Coordinator Rozell Searing RN  :You can purchase syringes at your pharmacy as long as you have a prescription on file. You can also purchase them "out of pocket" if depending on your Medicare Coverage there is some question on purchase or you don't have a prescription on file. Take Insulin as ordered and check your blood sugars before each meal. Give additional insulin per sliding scale ( see medication attached to this form)

## 2015-05-05 NOTE — Progress Notes (Signed)
Pt arrived today for his Fabrazyme infusion and states my" blood sugar is 167 today". He also states he was admitted over the weekend for elevated Glucose. Called Dr Marval Regal 's office and received new orders for his Fabrazyme and labs. In reviewing medication and health history pt had some confusion over his discharge instructions related to his insulin. Spoke with Crissie Figures RN Diabetes Coordinator (see her notes)An After Visit Summary from that encounter was printed and given to the patient and pt verbalizing understanding. Pt also arrived today and requested diet ginger ale and states he is" for a procedure at Dr Murlean Caller today to correct his anal fissure". Pt was unsure as to whether he was to be NPO for this and stated he "only had toast this AM because he took his insulin". CBG is 210 at this time. Placed call to Dr  Murlean Caller office per patient request and pt is scheduled for a consult today at 1400 for a future procedure for his anal fissure  and there was no need for fasting.This information was relayed to patient.

## 2015-05-05 NOTE — Progress Notes (Signed)
Received call from Marcie Bal, [place] in [facility] about this patient.  Per RN, patient having confusion about his home insulin regimen.  Was admitted to hospital on 11/24 with hyperglycemia.  Discharged on 11/26 with instructions to continue 70/30 insulin- 20 units bid with meals.  Was also given a Rx to start Novolog SSI (0-15 units) tidac.  Patient was given Rxs for 70/30 insulin, Novolog SSI, and insulin syringes at time of d/c on 11/26.  Reviewed these d/c instructions with Marcie Bal, RN from [facility].  RN to review these insulin instructions with the patient before he goes home today from Short Stay after his infusion.  Asked RN to please print & review AVS from his hospital stay from 11/24-11/26.  Wyn Quaker RN, MSN, CDE Diabetes Coordinator Inpatient Glycemic Control Team Team Pager: [phone] (8a-5p)

## 2015-05-06 ENCOUNTER — Other Ambulatory Visit (HOSPITAL_COMMUNITY): Payer: Medicare Other

## 2015-05-07 ENCOUNTER — Ambulatory Visit (HOSPITAL_COMMUNITY)
Admission: RE | Admit: 2015-05-07 | Discharge: 2015-05-07 | Disposition: A | Discharge status: Home / Self Care | Payer: Medicare Other | Source: Ambulatory Visit | Attending: Physician Assistant | Admitting: Physician Assistant

## 2015-05-07 DIAGNOSIS — I10 Essential (primary) hypertension: Secondary | ICD-10-CM | POA: Diagnosis not present

## 2015-05-07 DIAGNOSIS — Z01810 Encounter for preprocedural cardiovascular examination: Secondary | ICD-10-CM | POA: Insufficient documentation

## 2015-05-07 DIAGNOSIS — E119 Type 2 diabetes mellitus without complications: Secondary | ICD-10-CM | POA: Diagnosis not present

## 2015-05-07 DIAGNOSIS — I421 Obstructive hypertrophic cardiomyopathy: Secondary | ICD-10-CM | POA: Diagnosis not present

## 2015-05-07 DIAGNOSIS — I517 Cardiomegaly: Secondary | ICD-10-CM | POA: Insufficient documentation

## 2015-05-07 NOTE — Progress Notes (Signed)
  Echocardiogram 2D Echocardiogram has been performed.  [name] 05/07/2015, 3:51 PM

## 2015-05-08 ENCOUNTER — Telehealth: Payer: Self-pay | Admitting: Family Medicine

## 2015-05-08 NOTE — Telephone Encounter (Signed)
Patient called and requested a new prescription for his diabetic test strips. Patient uses [facility] on [address]. Please f/u

## 2015-05-09 ENCOUNTER — Encounter: Payer: Self-pay | Admitting: Physician Assistant

## 2015-05-09 ENCOUNTER — Telehealth: Payer: Self-pay | Admitting: *Deleted

## 2015-05-09 NOTE — Telephone Encounter (Signed)
Pt notified of echo results by phone w/verbal understanding. Pt aware ok to proceed w/surgery. Per Brynda Rim PA fax addended note to Carlene Coria, [place] at fax # [phone].

## 2015-05-09 NOTE — Progress Notes (Signed)
Echo 05/07/15 Severe LVH EF 55-60% abnormal GLS consistent with HOCM, no SAM mild LAE  Reviewed with Dr. Daneen Schick. Patient overall stable. He is at mild to moderate (but acceptable) risk for complications during surgery. He may proceed as planned. As noted, close attention should be kept regarding his fluid status in the perioperative period. Hypotension should be avoided. Our service is available as necessary. Signed, Richardson Dopp, PA-C   05/09/2015 12:55 PM

## 2015-05-12 NOTE — Telephone Encounter (Signed)
Patient called requesting diabetic test strips for glucometer. Please follow up

## 2015-05-13 ENCOUNTER — Telehealth: Payer: Self-pay | Admitting: Family Medicine

## 2015-05-13 NOTE — Telephone Encounter (Signed)
ACCU-CHEK AVIVA PLUS TEST STRP

## 2015-05-14 ENCOUNTER — Other Ambulatory Visit: Payer: Self-pay | Admitting: *Deleted

## 2015-05-14 ENCOUNTER — Other Ambulatory Visit: Payer: Self-pay | Admitting: Family Medicine

## 2015-05-14 DIAGNOSIS — E118 Type 2 diabetes mellitus with unspecified complications: Principal | ICD-10-CM

## 2015-05-14 DIAGNOSIS — E1165 Type 2 diabetes mellitus with hyperglycemia: Secondary | ICD-10-CM

## 2015-05-14 MED ORDER — GLUCOSE BLOOD VI STRP: 1.0000 | ORAL_STRIP | Freq: Three times a day (TID) | Status: DC

## 2015-05-14 MED ORDER — GLUCOSE BLOOD VI STRP: ORAL_STRIP | Status: DC

## 2015-05-14 NOTE — Telephone Encounter (Signed)
Rx send to [facility]  

## 2015-05-20 ENCOUNTER — Ambulatory Visit: Payer: Medicare Other | Attending: Family Medicine | Admitting: Pharmacist

## 2015-05-20 DIAGNOSIS — E1159 Type 2 diabetes mellitus with other circulatory complications: Secondary | ICD-10-CM

## 2015-05-20 DIAGNOSIS — E1165 Type 2 diabetes mellitus with hyperglycemia: Secondary | ICD-10-CM

## 2015-05-20 DIAGNOSIS — E119 Type 2 diabetes mellitus without complications: Secondary | ICD-10-CM | POA: Insufficient documentation

## 2015-05-20 DIAGNOSIS — Z794 Long term (current) use of insulin: Secondary | ICD-10-CM | POA: Insufficient documentation

## 2015-05-20 DIAGNOSIS — IMO0002 Reserved for concepts with insufficient information to code with codable children: Secondary | ICD-10-CM

## 2015-05-20 MED ORDER — INSULIN ASPART PROT & ASPART (70-30 MIX) 100 UNIT/ML ~~LOC~~ SUSP: 20.0000 [IU] | Freq: Two times a day (BID) | SUBCUTANEOUS | Status: DC

## 2015-05-20 MED ORDER — INSULIN ASPART PROT & ASPART (70-30 MIX) 100 UNIT/ML ~~LOC~~ SUSP: 30.0000 [IU] | Freq: Two times a day (BID) | SUBCUTANEOUS | Status: DC

## 2015-05-20 NOTE — Patient Instructions (Addendum)
Thanks for coming to see me today!  Take the 70/30 insulin 20 units twice a day. Keep using the sliding scale as needed.  Come back and see me in 2-3 weeks

## 2015-05-20 NOTE — Progress Notes (Signed)
S:    Patient arrives in good spirits.  Presents for diabetes management.  Patient reports adherence with medications. Current diabetes medications include Novolog Mix 70/30 20 units TID and Novolog sliding scale. He reports that he was told to take the 70/30 TID during his last hospital visit though his discharge instructions show that it was prescribed as BID.  Patient denies hypoglycemic events.  Patient reported dietary habits: patient does not follow a particular diet  Patient reported exercise habits: none   Patient denies nocturia.  Patient denies neuropathy. Patient denies visual changes. Patient reports self foot exams.    O:  Lab Results  Component Value Date   HGBA1C 10.50 04/21/2015   Patient did not bring meter or log book with him Reported Home fasting CBG: 111 - 300 (mostly 200s)   A/P: Diabetes currently uncontrolled based on A1c of 10.5 and reported home CBGs.   Patient denies hypoglycemic events and is able to verbalize appropriate hypoglycemia management plan.  Patient reports adherence with medication. Control is suboptimal due to sedentary lifestyle and inadequate insulin dosing.  Instructed patient to take the Novolog Mix 70/30 20 units BID as prescribed. I do think he will require a higher dose but I want to see his meter or logbook to appropriately adjust because I am not sure if this patient is a good historian. Patient to continue Novolog sliding scale. Patient to bring logbook or meter to next visit. Reviewed blood glucose goals for fasting and post-prandial readings.  Next A1C anticipated February 2017.    Medication reconciliation completed. Written patient instructions provided.  Total time in face to face counseling 20 minutes.  Follow up in Pharmacist Clinic Visit in 2-3 weeks.

## 2015-05-23 ENCOUNTER — Encounter (HOSPITAL_COMMUNITY): Payer: Self-pay

## 2015-05-23 ENCOUNTER — Encounter (HOSPITAL_COMMUNITY)
Admission: RE | Admit: 2015-05-23 | Discharge: 2015-05-23 | Disposition: A | Discharge status: Home / Self Care | Payer: Medicare Other | Source: Ambulatory Visit | Attending: Nephrology | Admitting: Nephrology

## 2015-05-23 DIAGNOSIS — E1101 Type 2 diabetes mellitus with hyperosmolarity with coma: Secondary | ICD-10-CM | POA: Diagnosis not present

## 2015-05-23 DIAGNOSIS — E7521 Fabry (-Anderson) disease: Secondary | ICD-10-CM | POA: Diagnosis not present

## 2015-05-23 LAB — GLUCOSE, CAPILLARY: Glucose-Capillary: 183 mg/dL — ABNORMAL HIGH (ref 65–99)

## 2015-05-23 MED ORDER — SODIUM CHLORIDE 0.9 % IV SOLN
INTRAVENOUS | Status: DC
  Administered 2015-05-23: 10:00:00 via INTRAVENOUS

## 2015-05-23 MED ORDER — LORATADINE 10 MG PO TABS
10.0000 mg | ORAL_TABLET | Freq: Every day | ORAL | Status: DC
  Administered 2015-05-23: 10 mg via ORAL
  Filled 2015-05-23: qty 1

## 2015-05-23 MED ORDER — SODIUM CHLORIDE 0.9 % IV SOLN
115.0000 mg | INTRAVENOUS | Status: DC
  Administered 2015-05-23: 115 mg via INTRAVENOUS
  Filled 2015-05-23: qty 21

## 2015-05-23 MED ORDER — ACETAMINOPHEN 500 MG PO TABS
500.0000 mg | ORAL_TABLET | Freq: Once | ORAL | Status: AC
  Administered 2015-05-23: 500 mg via ORAL
  Filled 2015-05-23: qty 1

## 2015-05-27 NOTE — Progress Notes (Signed)
Electrophysiology Office Note Date: 05/28/2015  ID:  [name], DOB 1960-08-25, MRN [id number]  PCP: Minerva Ends, MD Primary Cardiologist: Tamala Julian Electrophysiologist: Allred  CC: Pacemaker follow-up  [name] is a 54 y.o. male seen today for Dr Rayann Heman.  He presents today for routine electrophysiology followup.  Since last being seen in our clinic, the patient reports doing reasonably well.  He has chronic dyspnea on exertion as well as LE edema.  He denies chest pain, palpitations, PND, orthopnea, nausea, vomiting, dizziness, syncope, weight gain, or early satiety.  Device History: STJ dual chamber PPM implanted 2012 for Mobitz II   Past Medical History  Diagnosis Date  . Second degree Mobitz II AV block     with syncope, s/p PPM  . Bifascicular block   . Pacemaker     02/12/11  . Lymphedema of lower extremity LEFT  >  RIGHT    "using ankle high socks at home; pump doesn't work for me" (07/23/2014)  . Short of breath on exertion   . History of cellulitis of skin with lymphangitis LEFT LEG  . Diastolic heart failure secondary to hypertrophic cardiomyopathy ([place]) CARDIOLOGIST-- DR Daneen Schick  . Edema 07/2013  . Chronic bronchitis ([place])     "seasonal; get it q yr"  . Pneumonia 08/2011  . Bell's palsy   . Type II diabetes mellitus ([place]) 2012    INSULIN DEPENDENT  . Hypertension 20 years  . 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 Chronic kidney disease (CKD), stage II (mild)     stage II to III/notes 07/23/2014  . Gout, arthritis 2014    bil feet. right worse  . Seasonal asthma NO INHALERS    30 years  . Chronic diastolic CHF (congestive heart failure) ([place]) 2010  . HOCM (hypertrophic obstructive cardiomyopathy) ([place])     a. Echo 11/16: Severe LVH, EF 55-60%, abnormal GLS consistent with HOCM, no SAM, mild LAE   Past Surgical History  Procedure Laterality Date  . Cardiac catheterization  03-12-2011  DR Daneen Schick    HYPERTROPHIC CARDIOMYOPATHY WITH LV CAVITY  APPEARANCE CONSISTANT WITH SIGNIFICANT APICAL HYPERTROPHY/ NORMAL LVSF / EF 55%/ EVIDENCE OF DIASTOLIC DYSFUNCTION WITH EDP OF 23-28mmHg AFTER A-WAVE/ NORMAL CORONARY ARTERIES  . Umbilical hernia repair  03/22/2012    Procedure: HERNIA REPAIR UMBILICAL ADULT;  Surgeon: Leighton Ruff, MD;  Location: [facility];  Service: General;  Laterality: N/A;  Umbilical Hernia Repair   . Hernia repair  0000000    Umbilical Hernia Repair  . Irrigation and debridement abscess N/A 07/27/2013    Procedure: IRRIGATION AND DEBRIDEMENT OF SKIN, SOFT TISSUE AND MUSCLES OF UPPER BACK (11X19X4cm) WITH 10 BLADE AND PULSATILE LAVAGE ;  Surgeon: Gayland Curry, MD;  Location: [facility];  Service: General;  Laterality: N/A;  . Insert / replace / remove pacemaker  02/12/2011    SJM implanted by Dr Rayann Heman for Mobitz II second degree AV block and syncope  . Incision and drainage perirectal abscess Left 07/29/2014    Procedure: IRRIGATION AND DEBRIDEMENT PERIRECTAL ABSCESS;  Surgeon: Doreen Salvage, MD;  Location: [facility];  Service: General;  Laterality: Left;  Prone position    Current Outpatient Prescriptions  Medication Sig Dispense Refill  . acetaminophen (TYLENOL) 500 MG tablet Take 500 mg by mouth once. Only taking 1 hour before infusion.  AS premed for Fabrazyme    . Agalsidase beta (FABRAZYME IV) Inject 115 mg  into the vein every 14 (fourteen) days.     . ALBUTEROL SULFATE HFA IN Inhale 1 puff into the lungs daily as needed (shortness of breath or wheezing).     [name] Kitchen allopurinol (ZYLOPRIM) 300 MG tablet Take 300 mg by mouth daily.    [name] Kitchen aspirin 81 MG tablet Take 81 mg by mouth daily.     [name] Kitchen eplerenone (INSPRA) 50 MG tablet Take 1 tablet (50 mg total) by mouth daily. 30 tablet 5  . fexofenadine (ALLEGRA) 180 MG tablet Take 180 mg by mouth every 14 (fourteen) days.     . insulin aspart (NOVOLOG) 100 UNIT/ML injection Inject 0-15 Units into the skin 3 (three) times daily with meals. 10 mL 11    . insulin aspart protamine- aspart (NOVOLOG MIX 70/30) (70-30) 100 UNIT/ML injection Inject 0.2 mLs (20 Units total) into the skin 2 (two) times daily with a meal. 10 mL 12  . losartan-hydrochlorothiazide (HYZAAR) 50-12.5 MG tablet Take 1 tablet by mouth daily.  6  . meclizine (ANTIVERT) 25 MG tablet Take 25 mg by mouth every 6 (six) hours as needed for dizziness or nausea.   1  . [DISCONTINUED] furosemide (LASIX) 20 MG tablet Take 80 mg by mouth daily.      No current facility-administered medications for this visit.    Allergies:   Shellfish allergy   Social History: Social History   Social History  . Marital Status: Divorced    Spouse Name: N/A  . Number of Children: N/A  . Years of Education: N/A   Occupational History  . Not on file.   Social History Main Topics  . Smoking status: Never Smoker   . Smokeless tobacco: Never Used  . Alcohol Use: Yes     Comment: 07/23/2014 "might have a few drinks on holidays or at cookouts"  . Drug Use: No  . Sexual Activity: Yes   Other Topics Concern  . Not on file   Social History Narrative   Coaches [employer]     Family History: Family History  Problem Relation Age of Onset  . Colon cancer Neg Hx   . Stroke Mother      Review of Systems: All other systems reviewed and are otherwise negative except as noted above.   Physical Exam: VS:  BP 126/90 mmHg  Pulse 66  Ht 5\' 10"  (1.778 m)  Wt 266 lb 9.6 oz (120.929 kg)  BMI 38.25 kg/m2 , BMI Body mass index is 38.25 kg/(m^2).  GEN- The patient is obese appearing, alert and oriented x 3 today.   HEENT: normocephalic, atraumatic; sclera clear, conjunctiva pink; hearing intact; oropharynx clear; neck supple Lungs- Clear to ausculation bilaterally, normal work of breathing.  No wheezes, rales, rhonchi Heart- Regular rate and rhythm (paced) GI- soft, non-tender, non-distended, bowel sounds present Extremities- no clubbing, cyanosis, 2+ BLE edema, compression hose MS- no  significant deformity or atrophy Skin- warm and dry, no rash or lesion; PPM pocket well healed Psych- euthymic mood, full affect Neuro- strength and sensation are intact  PPM Interrogation- reviewed in detail today,  See PACEART report  EKG:  EKG is not ordered today.  Recent Labs: 05/02/2015: Hemoglobin 15.8; Platelets 246 05/05/2015: ALT 32; BUN 16; Creatinine, Ser 1.29*; Potassium 3.7; Sodium 139   Wt Readings from Last 3 Encounters:  05/28/15 266 lb 9.6 oz (120.929 kg)  05/23/15 265 lb (120.203 kg)  05/05/15 263 lb 6.4 oz (119.477 kg)     Other studies Reviewed: Additional studies/ records  that were reviewed today include: Dr Rayann Heman and Trinidad Curet notes, echo  Assessment and Plan:  1.  Mobitz II heart block Normal PPM function - pt is dependent today See Pace Art report No changes today  2.  Shortness of breath Chronic Likely multifactorial, EF normal with recent echo Would recommend CPX at this time  3.  Chronic diastolic heart failure Euvolemic on exam Continue current medications  4.  HTN Stable No change required today     Current medicines are reviewed at length with the patient today.   The patient does not have concerns regarding his medicines.  The following changes were made today:  none  Labs/ tests ordered today include: CPX   Disposition:   Follow up with Merlin transmissions, Dr Rayann Heman in 1 year, Dr Tamala Julian as scheduled   Signed, Chanetta Marshall, NP 05/28/2015 1:12 PM  [place] [address] [address] [phone] (office) [phone] (fax)

## 2015-05-28 ENCOUNTER — Encounter: Payer: Self-pay | Admitting: Internal Medicine

## 2015-05-28 ENCOUNTER — Encounter: Payer: Self-pay | Admitting: Nurse Practitioner

## 2015-05-28 ENCOUNTER — Telehealth (HOSPITAL_COMMUNITY): Payer: Self-pay | Admitting: Vascular Surgery

## 2015-05-28 ENCOUNTER — Ambulatory Visit (INDEPENDENT_AMBULATORY_CARE_PROVIDER_SITE_OTHER): Payer: Medicare Other | Admitting: Nurse Practitioner

## 2015-05-28 VITALS — BP 126/90 | HR 66 | Ht 70.0 in | Wt 266.6 lb

## 2015-05-28 DIAGNOSIS — I5032 Chronic diastolic (congestive) heart failure: Secondary | ICD-10-CM | POA: Diagnosis not present

## 2015-05-28 DIAGNOSIS — I441 Atrioventricular block, second degree: Secondary | ICD-10-CM

## 2015-05-28 DIAGNOSIS — I10 Essential (primary) hypertension: Secondary | ICD-10-CM

## 2015-05-28 LAB — CUP PACEART INCLINIC DEVICE CHECK
Date Time Interrogation Session: 20161221132918
Implantable Lead Implant Date: 20120907
Implantable Lead Implant Date: 20120907
Implantable Lead Location: 753859
Implantable Lead Location: 753860
Lead Channel Setting Pacing Amplitude: 1.625
Lead Channel Setting Pacing Amplitude: 1.625
Lead Channel Setting Pacing Pulse Width: 0.5 ms
Lead Channel Setting Sensing Sensitivity: 2 mV
Pulse Gen Model: 2210
Pulse Gen Serial Number: 7267950

## 2015-05-28 NOTE — Patient Instructions (Signed)
Medication Instructions:   CONTINUE SAME MEDICATIONS   If you need a refill on your cardiac medications before your next appointment, please call your pharmacy.  Labwork: NONE ORDER TODAY    Testing/Procedures: Your physician has recommended that you have a cardiopulmonary stress test (CPX). CPX testing is a non-invasive measurement of heart and lung function. It replaces a traditional treadmill stress test. This type of test provides a tremendous amount of information that relates not only to your present condition but also for future outcomes. This test combines measurements of you ventilation, respiratory gas exchange in the lungs, electrocardiogram (EKG), blood pressure and physical response before, during, and following an exercise protocol.   Follow-Up:  Your physician wants you to follow-up in: [place] will receive a reminder letter in the mail two months in advance. If you don't receive a letter, please call our office to schedule the follow-up appointment.    Remote monitoring is used to monitor your Pacemaker of ICD from home. This monitoring reduces the number of office visits required to check your device to one time per year. It allows [place] to keep an eye on the functioning of your device to ensure it is working properly. You are scheduled for a device check from home on .08/26/15 You may send your transmission at any time that day. If you have a wireless device, the transmission will be sent automatically. After your physician reviews your transmission, you will receive a postcard with your next transmission date.     Any Other Special Instructions Will Be Listed Below (If Applicable).

## 2015-05-28 NOTE — Telephone Encounter (Signed)
Left pt message to make CPX APPT 

## 2015-06-03 ENCOUNTER — Ambulatory Visit: Payer: Medicare Other | Attending: Family Medicine | Admitting: Pharmacist

## 2015-06-03 DIAGNOSIS — IMO0002 Reserved for concepts with insufficient information to code with codable children: Secondary | ICD-10-CM

## 2015-06-03 DIAGNOSIS — Z794 Long term (current) use of insulin: Secondary | ICD-10-CM | POA: Diagnosis not present

## 2015-06-03 DIAGNOSIS — E1159 Type 2 diabetes mellitus with other circulatory complications: Secondary | ICD-10-CM | POA: Diagnosis not present

## 2015-06-03 DIAGNOSIS — E1165 Type 2 diabetes mellitus with hyperglycemia: Secondary | ICD-10-CM

## 2015-06-03 MED ORDER — GLUCOSE BLOOD VI STRP: ORAL_STRIP | Status: DC

## 2015-06-03 NOTE — Patient Instructions (Signed)
Thanks for coming to see me!  Don't make any changes to your insulin!  Try to cut out the regular soda and drink diet soda - I think it will really make a difference.  You are doing a great job overall - we are almost to goal!  Come back and see me in 3-4 weeks.

## 2015-06-03 NOTE — Progress Notes (Signed)
S:    Patient arrives in good spirits.  Presents for diabetes management.  Patient reports adherence with medications. Current diabetes medications include Novolog Mix 70/30 20 units BID and Novolog sliding scale.   Patient reports hypoglycemic events. They usually occurred after he did not eat enough for a meal. He had 2 episodes that occurred in the morning and 1 episode that occurred in the afternoon.  Patient reported dietary habits: patient does not follow a particular diet. He reports drinking orange juice or soda during the day.  Patient reported exercise habits: none - it is really hard with his heart condition.   Patient denies nocturia.  Patient denies neuropathy. Patient denies visual changes. Patient reports self foot exams.    O:  Lab Results  Component Value Date   HGBA1C 10.50 04/21/2015   Home fasting CBGs: 58 - 236 (average 132) Home post-prandial/random CBGs: 67 - 266 (average 160)  A/P: Diabetes currently uncontrolled based on A1c of 10.5 but improving based on home CBGs.  Patient reports hypoglycemic events and is able to verbalize appropriate hypoglycemia management plan.  Patient reports adherence with medication. Control is suboptimal due to sedentary lifestyle and dietary indiscretion.  Instructed patient to continue to take the Novolog Mix 70/30 20 units BID. Patient to continue Novolog sliding scale. I do not want to adjust it due to the hypoglycemia. Encouraged patient to eat enough for meals to avoid hypoglycemia. If patient has continued hypoglycemia at next visit, will decrease the dose of insulin slightly. Also encouraged patient to switch to diet soda to avoid the few spikes in blood glucose that he had. Overall, patient is doing well based on average numbers so I congratulated him on his progress.   Next A1C anticipated February 2017.    Medication reconciliation completed. Written patient instructions provided.  Total time in face to face counseling  20 minutes.  Follow up in Pharmacist Clinic Visit in 3-4 weeks.

## 2015-06-06 ENCOUNTER — Encounter (HOSPITAL_COMMUNITY)
Admission: RE | Admit: 2015-06-06 | Discharge: 2015-06-06 | Disposition: A | Discharge status: Home / Self Care | Payer: Medicare Other | Source: Ambulatory Visit | Attending: Nephrology | Admitting: Nephrology

## 2015-06-06 ENCOUNTER — Encounter (HOSPITAL_COMMUNITY): Payer: Self-pay

## 2015-06-06 ENCOUNTER — Telehealth (HOSPITAL_COMMUNITY): Payer: Self-pay | Admitting: Vascular Surgery

## 2015-06-06 DIAGNOSIS — E7521 Fabry (-Anderson) disease: Secondary | ICD-10-CM | POA: Diagnosis not present

## 2015-06-06 DIAGNOSIS — E1101 Type 2 diabetes mellitus with hyperosmolarity with coma: Secondary | ICD-10-CM | POA: Diagnosis not present

## 2015-06-06 LAB — URINALYSIS W MICROSCOPIC (NOT AT ARMC)
Bacteria, UA: NONE SEEN
Bilirubin Urine: NEGATIVE
Glucose, UA: NEGATIVE mg/dL
Hgb urine dipstick: NEGATIVE
Ketones, ur: NEGATIVE mg/dL
Leukocytes, UA: NEGATIVE
Nitrite: NEGATIVE
Protein, ur: 100 mg/dL — AB
RBC / HPF: NONE SEEN RBC/hpf (ref 0–5)
Specific Gravity, Urine: 1.008 (ref 1.005–1.030)
Squamous Epithelial / LPF: NONE SEEN
WBC, UA: NONE SEEN WBC/hpf (ref 0–5)
pH: 6 (ref 5.0–8.0)

## 2015-06-06 LAB — COMPREHENSIVE METABOLIC PANEL
ALT: 25 U/L (ref 17–63)
AST: 31 U/L (ref 15–41)
Albumin: 3.2 g/dL — ABNORMAL LOW (ref 3.5–5.0)
Alkaline Phosphatase: 79 U/L (ref 38–126)
Anion gap: 11 (ref 5–15)
BUN: 21 mg/dL — ABNORMAL HIGH (ref 6–20)
CO2: 26 mmol/L (ref 22–32)
Calcium: 9.6 mg/dL (ref 8.9–10.3)
Chloride: 108 mmol/L (ref 101–111)
Creatinine, Ser: 1.4 mg/dL — ABNORMAL HIGH (ref 0.61–1.24)
GFR calc Af Amer: >60 mL/min (ref >60)
GFR calc non Af Amer: 56 mL/min — ABNORMAL LOW (ref >60)
Glucose, Bld: 138 mg/dL — ABNORMAL HIGH (ref 65–99)
Potassium: 4.3 mmol/L (ref 3.5–5.1)
Sodium: 145 mmol/L (ref 135–145)
Total Bilirubin: 0.4 mg/dL (ref 0.3–1.2)
Total Protein: 8 g/dL (ref 6.5–8.1)

## 2015-06-06 LAB — PHOSPHORUS: Phosphorus: 3.9 mg/dL (ref 2.5–4.6)

## 2015-06-06 MED ORDER — AGALSIDASE BETA 5 MG IV SOLR
115.0000 mg | INTRAVENOUS | Status: DC
  Administered 2015-06-06: 115 mg via INTRAVENOUS
  Filled 2015-06-06: qty 21

## 2015-06-06 MED ORDER — SODIUM CHLORIDE 0.9 % IV SOLN
INTRAVENOUS | Status: DC
  Administered 2015-06-06: 250 mL via INTRAVENOUS

## 2015-06-06 NOTE — Telephone Encounter (Signed)
Left pt detailed message stating that CPX machine is down , pt will need to reschedule appt

## 2015-06-11 ENCOUNTER — Encounter (HOSPITAL_COMMUNITY): Payer: Medicare Other

## 2015-06-11 NOTE — Progress Notes (Signed)
Cardiology Office Note   Date:  06/12/2015   ID:  [name], DOB [birth year], MRN [id number]  PCP:  Minerva Ends, MD  Cardiologist:  Sinclair Grooms, MD   Chief Complaint  Patient presents with  . Congestive Heart Failure      History of Present Illness: [name] is a 55 y.o. male who presents for cardiomyopathy, Fabry's disease, hypertension, AV conduction system disease related to Fabry's, and permanent pacemaker. He has bilateral lymphedema.  Overall doing well. Having exertional dyspnea. No significant change compared to prior. Has not had syncope or palpitations. Denies chest discomfort. No orthopnea or PND has been noted. He does have occasional wheezing.    Past Medical History  Diagnosis Date  . Second degree Mobitz II AV block     with syncope, s/p PPM  . Bifascicular block   . Pacemaker     02/12/11  . Lymphedema of lower extremity LEFT  >  RIGHT    "using ankle high socks at home; pump doesn't work for me" (07/23/2014)  . Short of breath on exertion   . History of cellulitis of skin with lymphangitis LEFT LEG  . Diastolic heart failure secondary to hypertrophic cardiomyopathy ([place]) CARDIOLOGIST-- DR Daneen Schick  . Edema 07/2013  . Chronic bronchitis ([place])     "seasonal; get it q yr"  . Pneumonia 08/2011  . Bell's palsy   . Type II diabetes mellitus ([place]) 2012    INSULIN DEPENDENT  . Hypertension 20 years  . 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 Chronic kidney disease (CKD), stage II (mild)     stage II to III/notes 07/23/2014  . Gout, arthritis 2014    bil feet. right worse  . Seasonal asthma NO INHALERS    30 years  . Chronic diastolic CHF (congestive heart failure) ([place]) 2010  . HOCM (hypertrophic obstructive cardiomyopathy) ([place])     a. Echo 11/16: Severe LVH, EF 55-60%, abnormal GLS consistent with HOCM, no SAM, mild LAE    Past Surgical History  Procedure Laterality Date  . Cardiac  catheterization  03-12-2011  DR Daneen Schick    HYPERTROPHIC CARDIOMYOPATHY WITH LV CAVITY  APPEARANCE CONSISTANT WITH SIGNIFICANT APICAL HYPERTROPHY/ NORMAL LVSF / EF 55%/ EVIDENCE OF DIASTOLIC DYSFUNCTION WITH EDP OF 23-49mmHg AFTER A-WAVE/ NORMAL CORONARY ARTERIES  . Umbilical hernia repair  03/22/2012    Procedure: HERNIA REPAIR UMBILICAL ADULT;  Surgeon: Leighton Ruff, MD;  [facility];  Service: General;  Laterality: N/A;  Umbilical Hernia Repair   . Hernia repair  0000000    Umbilical Hernia Repair  . Irrigation and debridement abscess N/A 07/27/2013    Procedure: IRRIGATION AND DEBRIDEMENT OF SKIN, SOFT TISSUE AND MUSCLES OF UPPER BACK (11X19X4cm) WITH 10 BLADE AND PULSATILE LAVAGE ;  Surgeon: Gayland Curry, MD;  Location: [facility];  Service: General;  Laterality: N/A;  . Insert / replace / remove pacemaker  02/12/2011    SJM implanted by Dr Rayann Heman for Mobitz II second degree AV block and syncope  . Incision and drainage perirectal abscess Left 07/29/2014    Procedure: IRRIGATION AND DEBRIDEMENT PERIRECTAL ABSCESS;  Surgeon: Doreen Salvage, MD;  Location: [facility];  Service: General;  Laterality: Left;  Prone position     Current Outpatient Prescriptions  Medication Sig Dispense Refill  . acetaminophen (TYLENOL) 500 MG tablet Take 500 mg by mouth once. Only taking 1 hour before infusion.  AS  premed for Fabrazyme    . Agalsidase beta (FABRAZYME IV) Inject 115 mg into the vein every 14 (fourteen) days.     . ALBUTEROL SULFATE HFA IN Inhale 1 puff into the lungs daily as needed (shortness of breath or wheezing).     [name] Kitchen allopurinol (ZYLOPRIM) 300 MG tablet Take 300 mg by mouth daily.    [name] Kitchen aspirin 81 MG tablet Take 81 mg by mouth daily.     [name] Kitchen eplerenone (INSPRA) 50 MG tablet Take 1 tablet (50 mg total) by mouth daily. 30 tablet 5  . fexofenadine (ALLEGRA) 180 MG tablet Take 180 mg by mouth every 14 (fourteen) days.     [name] Kitchen glucose blood (ACCU-CHEK AVIVA) test strip Use as instructed 100 each 12  . insulin  aspart (NOVOLOG) 100 UNIT/ML injection Inject 0-15 Units into the skin 3 (three) times daily with meals. 10 mL 11  . insulin aspart protamine- aspart (NOVOLOG MIX 70/30) (70-30) 100 UNIT/ML injection Inject 0.2 mLs (20 Units total) into the skin 2 (two) times daily with a meal. 10 mL 12  . losartan-hydrochlorothiazide (HYZAAR) 50-12.5 MG tablet Take 1 tablet by mouth daily.  6  . meclizine (ANTIVERT) 25 MG tablet Take 25 mg by mouth every 6 (six) hours as needed for dizziness or nausea.   1  . [DISCONTINUED] furosemide (LASIX) 20 MG tablet Take 80 mg by mouth daily.      No current facility-administered medications for this visit.    Allergies:   Shellfish allergy    Social History:  The patient  reports that he has never smoked. He has never used smokeless tobacco. He reports that he drinks alcohol. He reports that he does not use illicit drugs.   Family History:  The patient's family history includes Stroke in his mother. There is no history of Colon cancer.    ROS:  Please see the history of present illness.   Otherwise, review of systems are positive for has had unexpected weight gain. Chronic lower extremity swelling related to lymphedema..   All other systems are reviewed and negative.    PHYSICAL EXAM: VS:  BP 120/84 mmHg  Pulse 71  Ht 5\' 10"  (1.778 m)  Wt 269 lb 6.4 oz (122.199 kg)  BMI 38.65 kg/m2  SpO2 95% , BMI Body mass index is 38.65 kg/(m^2). GEN: Well nourished, well developed, in no acute distress HEENT: normal Neck: no JVD, carotid bruits, or masses Cardiac: RRR.  There is no murmur, rub, or gallop. There is 2-3+ bilateral lower extremity edema. Respiratory:  clear to auscultation bilaterally, normal work of breathing. GI: soft, nontender, nondistended, + BS MS: no deformity or atrophy Skin: warm and dry, no rash Neuro:  Strength and sensation are intact Psych: euthymic mood, full affect   EKG:  EKG is not ordered today   Recent Labs: 05/02/2015: Hemoglobin  15.8; Platelets 246 06/06/2015: ALT 25; BUN 21*; Creatinine, Ser 1.40*; Potassium 4.3; Sodium 145    Lipid Panel    Component Value Date/Time   CHOL 241* 03/21/2015 0820   TRIG 177* 03/21/2015 0820   HDL 62 03/21/2015 0820   CHOLHDL 3.9 03/21/2015 0820   VLDL 35 03/21/2015 0820   LDLCALC 144* 03/21/2015 0820      Wt Readings from Last 3 Encounters:  06/12/15 269 lb 6.4 oz (122.199 kg)  06/06/15 265 lb 6 oz (120.373 kg)  05/28/15 266 lb 9.6 oz (120.929 kg)      Other studies Reviewed: Additional studies/ records that were  reviewed today include: review of recent laboratory data. The findings include which includes a creatinine of 1.4, potassium 4.3.. Recent pacemaker check in mid December 2016 was also normal.    ASSESSMENT AND PLAN:  1. Chronic diastolic HF (heart failure) (HCC) Volume overload related to LYMPHEDEMA. There is no pulmonary congestion.  2. Fabry disease [facility]) He receives Phazyme therapy  3. Essential hypertension controlled  4. Mobitz type II atrioventricular block NORMAL pacemaker function  5. Pacemaker-St.Jude See above  6. CKD (chronic kidney disease), stage III See above. Creatinine 1.4  7. Obstructive sleep apnea, suspected On  therapy    Current medicines are reviewed at length with the patient today.  The patient has the following concerns regarding medicines: none.  The following changes/actions have been instituted:    No specific workup is necessary at this time  Labs/ tests ordered today include:  No orders of the defined types were placed in this encounter.     Disposition:   FU with HS in 1 year  Signed, Sinclair Grooms, MD  06/12/2015 8:50 AM    [facility] [address], [place], [place]  [address] Phone: [phone]; Fax: [phone]

## 2015-06-12 ENCOUNTER — Encounter: Payer: Self-pay | Admitting: Interventional Cardiology

## 2015-06-12 ENCOUNTER — Ambulatory Visit (INDEPENDENT_AMBULATORY_CARE_PROVIDER_SITE_OTHER): Payer: Medicare Other | Admitting: Interventional Cardiology

## 2015-06-12 VITALS — BP 120/84 | HR 71 | Ht 70.0 in | Wt 269.4 lb

## 2015-06-12 DIAGNOSIS — I10 Essential (primary) hypertension: Secondary | ICD-10-CM | POA: Diagnosis not present

## 2015-06-12 DIAGNOSIS — N183 Chronic kidney disease, stage 3 unspecified: Secondary | ICD-10-CM

## 2015-06-12 DIAGNOSIS — I441 Atrioventricular block, second degree: Secondary | ICD-10-CM

## 2015-06-12 DIAGNOSIS — E7521 Fabry (-Anderson) disease: Secondary | ICD-10-CM

## 2015-06-12 DIAGNOSIS — I5032 Chronic diastolic (congestive) heart failure: Secondary | ICD-10-CM | POA: Diagnosis not present

## 2015-06-12 DIAGNOSIS — G4733 Obstructive sleep apnea (adult) (pediatric): Secondary | ICD-10-CM

## 2015-06-12 DIAGNOSIS — Z950 Presence of cardiac pacemaker: Secondary | ICD-10-CM

## 2015-06-12 NOTE — Patient Instructions (Signed)

## 2015-06-20 ENCOUNTER — Encounter (HOSPITAL_COMMUNITY): Payer: Self-pay

## 2015-06-20 ENCOUNTER — Encounter (HOSPITAL_COMMUNITY)
Admission: RE | Admit: 2015-06-20 | Discharge: 2015-06-20 | Disposition: A | Discharge status: Home / Self Care | Payer: Medicare Other | Source: Ambulatory Visit | Attending: Nephrology | Admitting: Nephrology

## 2015-06-20 DIAGNOSIS — E7521 Fabry (-Anderson) disease: Secondary | ICD-10-CM | POA: Diagnosis not present

## 2015-06-20 DIAGNOSIS — E1101 Type 2 diabetes mellitus with hyperosmolarity with coma: Secondary | ICD-10-CM | POA: Insufficient documentation

## 2015-06-20 LAB — CBC WITH DIFFERENTIAL/PLATELET
Basophils Absolute: 0 10*3/uL (ref 0.0–0.1)
Basophils Relative: 0 %
Eosinophils Absolute: 0.4 10*3/uL (ref 0.0–0.7)
Eosinophils Relative: 5 %
HCT: 46.3 % (ref 39.0–52.0)
Hemoglobin: 15.6 g/dL (ref 13.0–17.0)
Lymphocytes Relative: 39 %
Lymphs Abs: 2.8 10*3/uL (ref 0.7–4.0)
MCH: 30.4 pg (ref 26.0–34.0)
MCHC: 33.7 g/dL (ref 30.0–36.0)
MCV: 90.1 fL (ref 78.0–100.0)
Monocytes Absolute: 0.7 10*3/uL (ref 0.1–1.0)
Monocytes Relative: 10 %
Neutro Abs: 3.4 10*3/uL (ref 1.7–7.7)
Neutrophils Relative %: 46 %
Platelets: 200 10*3/uL (ref 150–400)
RBC: 5.14 MIL/uL (ref 4.22–5.81)
RDW: 12.7 % (ref 11.5–15.5)
WBC: 7.3 10*3/uL (ref 4.0–10.5)

## 2015-06-20 LAB — LIPID PANEL
Cholesterol: 255 mg/dL — ABNORMAL HIGH (ref 0–200)
HDL: 58 mg/dL (ref >40)
LDL Cholesterol: 175 mg/dL — ABNORMAL HIGH (ref 0–99)
Total CHOL/HDL Ratio: 4.4 RATIO
Triglycerides: 111 mg/dL (ref <150)
VLDL: 22 mg/dL (ref 0–40)

## 2015-06-20 LAB — FERRITIN: Ferritin: 105 ng/mL (ref 24–336)

## 2015-06-20 LAB — IRON AND TIBC
Iron: 123 ug/dL (ref 45–182)
Saturation Ratios: 41 % — ABNORMAL HIGH (ref 17.9–39.5)
TIBC: 298 ug/dL (ref 250–450)
UIBC: 175 ug/dL

## 2015-06-20 MED ORDER — SODIUM CHLORIDE 0.9 % IV SOLN
115.0000 mg | INTRAVENOUS | Status: DC
  Administered 2015-06-20: 115 mg via INTRAVENOUS
  Filled 2015-06-20: qty 2

## 2015-06-20 MED ORDER — SODIUM CHLORIDE 0.9 % IV SOLN
INTRAVENOUS | Status: DC
  Administered 2015-06-20: 250 mL via INTRAVENOUS

## 2015-06-23 ENCOUNTER — Encounter (HOSPITAL_COMMUNITY): Payer: Self-pay | Admitting: *Deleted

## 2015-06-23 ENCOUNTER — Encounter (HOSPITAL_COMMUNITY)
Admission: RE | Disposition: A | Discharge status: Home / Self Care | Payer: Self-pay | Source: Ambulatory Visit | Attending: General Surgery

## 2015-06-23 ENCOUNTER — Ambulatory Visit (HOSPITAL_COMMUNITY): Payer: Medicare Other | Admitting: Anesthesiology

## 2015-06-23 ENCOUNTER — Encounter (HOSPITAL_COMMUNITY): Payer: Medicare Other

## 2015-06-23 ENCOUNTER — Observation Stay (HOSPITAL_COMMUNITY)
Admission: RE | Admit: 2015-06-23 | Discharge: 2015-06-24 | Disposition: A | Discharge status: Home / Self Care | Payer: Medicare Other | Source: Ambulatory Visit | Attending: General Surgery | Admitting: General Surgery

## 2015-06-23 DIAGNOSIS — I10 Essential (primary) hypertension: Secondary | ICD-10-CM | POA: Diagnosis not present

## 2015-06-23 DIAGNOSIS — J45909 Unspecified asthma, uncomplicated: Secondary | ICD-10-CM | POA: Insufficient documentation

## 2015-06-23 DIAGNOSIS — K611 Rectal abscess: Principal | ICD-10-CM | POA: Insufficient documentation

## 2015-06-23 DIAGNOSIS — J449 Chronic obstructive pulmonary disease, unspecified: Secondary | ICD-10-CM | POA: Diagnosis not present

## 2015-06-23 DIAGNOSIS — Z794 Long term (current) use of insulin: Secondary | ICD-10-CM | POA: Insufficient documentation

## 2015-06-23 DIAGNOSIS — K605 Anorectal fistula, unspecified: Secondary | ICD-10-CM | POA: Diagnosis present

## 2015-06-23 DIAGNOSIS — G4730 Sleep apnea, unspecified: Secondary | ICD-10-CM | POA: Diagnosis not present

## 2015-06-23 DIAGNOSIS — E119 Type 2 diabetes mellitus without complications: Secondary | ICD-10-CM | POA: Diagnosis not present

## 2015-06-23 DIAGNOSIS — M1990 Unspecified osteoarthritis, unspecified site: Secondary | ICD-10-CM | POA: Diagnosis not present

## 2015-06-23 DIAGNOSIS — G8918 Other acute postprocedural pain: Secondary | ICD-10-CM | POA: Diagnosis not present

## 2015-06-23 DIAGNOSIS — Z79899 Other long term (current) drug therapy: Secondary | ICD-10-CM | POA: Insufficient documentation

## 2015-06-23 HISTORY — PX: EVALUATION UNDER ANESTHESIA WITH FISTULECTOMY: SHX5623

## 2015-06-23 LAB — CBC WITH DIFFERENTIAL/PLATELET
Basophils Absolute: 0 10*3/uL (ref 0.0–0.1)
Basophils Relative: 0 %
Eosinophils Absolute: 0.3 10*3/uL (ref 0.0–0.7)
Eosinophils Relative: 5 %
HCT: 45.9 % (ref 39.0–52.0)
Hemoglobin: 15.2 g/dL (ref 13.0–17.0)
Lymphocytes Relative: 45 %
Lymphs Abs: 3.2 10*3/uL (ref 0.7–4.0)
MCH: 30.2 pg (ref 26.0–34.0)
MCHC: 33.1 g/dL (ref 30.0–36.0)
MCV: 91.1 fL (ref 78.0–100.0)
Monocytes Absolute: 0.7 10*3/uL (ref 0.1–1.0)
Monocytes Relative: 9 %
Neutro Abs: 2.9 10*3/uL (ref 1.7–7.7)
Neutrophils Relative %: 41 %
Platelets: 180 10*3/uL (ref 150–400)
RBC: 5.04 MIL/uL (ref 4.22–5.81)
RDW: 13 % (ref 11.5–15.5)
WBC: 7.1 10*3/uL (ref 4.0–10.5)

## 2015-06-23 LAB — GLUCOSE, CAPILLARY
Glucose-Capillary: 118 mg/dL — ABNORMAL HIGH (ref 65–99)
Glucose-Capillary: 133 mg/dL — ABNORMAL HIGH (ref 65–99)
Glucose-Capillary: 117 mg/dL — ABNORMAL HIGH (ref 65–99)
Glucose-Capillary: 128 mg/dL — ABNORMAL HIGH (ref 65–99)
Glucose-Capillary: 159 mg/dL — ABNORMAL HIGH (ref 65–99)
Glucose-Capillary: 253 mg/dL — ABNORMAL HIGH (ref 65–99)
Glucose-Capillary: 386 mg/dL — ABNORMAL HIGH (ref 65–99)

## 2015-06-23 LAB — BASIC METABOLIC PANEL
Anion gap: 12 (ref 5–15)
BUN: 17 mg/dL (ref 6–20)
CO2: 24 mmol/L (ref 22–32)
Calcium: 8.9 mg/dL (ref 8.9–10.3)
Chloride: 107 mmol/L (ref 101–111)
Creatinine, Ser: 1.27 mg/dL — ABNORMAL HIGH (ref 0.61–1.24)
GFR calc Af Amer: >60 mL/min (ref >60)
GFR calc non Af Amer: >60 mL/min (ref >60)
Glucose, Bld: 147 mg/dL — ABNORMAL HIGH (ref 65–99)
Potassium: 4 mmol/L (ref 3.5–5.1)
Sodium: 143 mmol/L (ref 135–145)

## 2015-06-23 SURGERY — EXAM UNDER ANESTHESIA WITH FISTULECTOMY
Anesthesia: General | Site: Rectum

## 2015-06-23 MED ORDER — HYDROCODONE-ACETAMINOPHEN 5-325 MG PO TABS: 1.0000 | ORAL_TABLET | ORAL | Status: DC | PRN

## 2015-06-23 MED ORDER — PHENYLEPHRINE HCL 10 MG/ML IJ SOLN
INTRAMUSCULAR | Status: DC | PRN
  Administered 2015-06-23 (×2): 40 ug via INTRAVENOUS

## 2015-06-23 MED ORDER — DEXAMETHASONE SODIUM PHOSPHATE 10 MG/ML IJ SOLN
INTRAMUSCULAR | Status: DC | PRN
  Administered 2015-06-23: 10 mg via INTRAVENOUS

## 2015-06-23 MED ORDER — ONDANSETRON HCL 4 MG/2ML IJ SOLN
INTRAMUSCULAR | Status: DC | PRN
  Administered 2015-06-23: 4 mg via INTRAVENOUS

## 2015-06-23 MED ORDER — FENTANYL CITRATE (PF) 100 MCG/2ML IJ SOLN
INTRAMUSCULAR | Status: DC | PRN
  Administered 2015-06-23 (×3): 50 ug via INTRAVENOUS

## 2015-06-23 MED ORDER — GLYCOPYRROLATE 0.2 MG/ML IJ SOLN
INTRAMUSCULAR | Status: DC | PRN
  Administered 2015-06-23: 0.6 mg via INTRAVENOUS

## 2015-06-23 MED ORDER — CEFOTETAN DISODIUM-DEXTROSE 2-2.08 GM-% IV SOLR
2.0000 g | INTRAVENOUS | Status: AC
  Administered 2015-06-23: 2 g via INTRAVENOUS
  Filled 2015-06-23: qty 50

## 2015-06-23 MED ORDER — PROMETHAZINE HCL 25 MG/ML IJ SOLN: 6.2500 mg | INTRAMUSCULAR | Status: DC | PRN

## 2015-06-23 MED ORDER — GLYCOPYRROLATE 0.2 MG/ML IJ SOLN
INTRAMUSCULAR | Status: AC
  Filled 2015-06-23: qty 3

## 2015-06-23 MED ORDER — FLEET ENEMA 7-19 GM/118ML RE ENEM: 1.0000 | ENEMA | Freq: Once | RECTAL | Status: DC

## 2015-06-23 MED ORDER — MIDAZOLAM HCL 5 MG/5ML IJ SOLN
INTRAMUSCULAR | Status: DC | PRN
  Administered 2015-06-23: 2 mg via INTRAVENOUS

## 2015-06-23 MED ORDER — SODIUM CHLORIDE 0.9 % IR SOLN
Status: DC | PRN
  Administered 2015-06-23: 1000 mL

## 2015-06-23 MED ORDER — HYDROMORPHONE HCL 1 MG/ML IJ SOLN: 0.2500 mg | INTRAMUSCULAR | Status: DC | PRN

## 2015-06-23 MED ORDER — NEOSTIGMINE METHYLSULFATE 10 MG/10ML IV SOLN
INTRAVENOUS | Status: AC
  Filled 2015-06-23: qty 1

## 2015-06-23 MED ORDER — ALBUTEROL SULFATE HFA 108 (90 BASE) MCG/ACT IN AERS
INHALATION_SPRAY | RESPIRATORY_TRACT | Status: AC
  Filled 2015-06-23: qty 6.7

## 2015-06-23 MED ORDER — PROPOFOL 10 MG/ML IV BOLUS
INTRAVENOUS | Status: DC | PRN
  Administered 2015-06-23 (×2): 100 mg via INTRAVENOUS

## 2015-06-23 MED ORDER — LIDOCAINE HCL (CARDIAC) 20 MG/ML IV SOLN
INTRAVENOUS | Status: AC
  Filled 2015-06-23: qty 5

## 2015-06-23 MED ORDER — LACTATED RINGERS IV SOLN
INTRAVENOUS | Status: DC
  Administered 2015-06-23: 12:00:00 via INTRAVENOUS

## 2015-06-23 MED ORDER — PHENYLEPHRINE 40 MCG/ML (10ML) SYRINGE FOR IV PUSH (FOR BLOOD PRESSURE SUPPORT)
PREFILLED_SYRINGE | INTRAVENOUS | Status: AC
  Filled 2015-06-23: qty 10

## 2015-06-23 MED ORDER — INSULIN ASPART PROT & ASPART (70-30 MIX) 100 UNIT/ML ~~LOC~~ SUSP
10.0000 [IU] | Freq: Once | SUBCUTANEOUS | Status: AC
  Administered 2015-06-23: 10 [IU] via SUBCUTANEOUS
  Filled 2015-06-23: qty 10

## 2015-06-23 MED ORDER — DIBUCAINE 1 % RE OINT
TOPICAL_OINTMENT | RECTAL | Status: AC
  Filled 2015-06-23: qty 28

## 2015-06-23 MED ORDER — MEPERIDINE HCL 25 MG/ML IJ SOLN: 6.2500 mg | INTRAMUSCULAR | Status: DC | PRN

## 2015-06-23 MED ORDER — FENTANYL CITRATE (PF) 250 MCG/5ML IJ SOLN
INTRAMUSCULAR | Status: AC
Start: 2015-06-23 — End: 2015-06-23
  Filled 2015-06-23: qty 5

## 2015-06-23 MED ORDER — MIDAZOLAM HCL 2 MG/2ML IJ SOLN
INTRAMUSCULAR | Status: AC
  Filled 2015-06-23: qty 2

## 2015-06-23 MED ORDER — INSULIN ASPART 100 UNIT/ML ~~LOC~~ SOLN: 2.0000 [IU] | Freq: Once | SUBCUTANEOUS | Status: DC

## 2015-06-23 MED ORDER — ONDANSETRON HCL 4 MG/2ML IJ SOLN
INTRAMUSCULAR | Status: AC
  Filled 2015-06-23: qty 2

## 2015-06-23 MED ORDER — ROCURONIUM BROMIDE 100 MG/10ML IV SOLN
INTRAVENOUS | Status: DC | PRN
  Administered 2015-06-23: 30 mg via INTRAVENOUS

## 2015-06-23 MED ORDER — ALBUTEROL SULFATE HFA 108 (90 BASE) MCG/ACT IN AERS
INHALATION_SPRAY | RESPIRATORY_TRACT | Status: DC | PRN
  Administered 2015-06-23: 5 via RESPIRATORY_TRACT

## 2015-06-23 MED ORDER — LIDOCAINE HCL (CARDIAC) 20 MG/ML IV SOLN
INTRAVENOUS | Status: DC | PRN
  Administered 2015-06-23: 100 mg via INTRAVENOUS

## 2015-06-23 MED ORDER — ROCURONIUM BROMIDE 50 MG/5ML IV SOLN
INTRAVENOUS | Status: AC
  Filled 2015-06-23: qty 1

## 2015-06-23 MED ORDER — NEOSTIGMINE METHYLSULFATE 10 MG/10ML IV SOLN
INTRAVENOUS | Status: DC | PRN
  Administered 2015-06-23: 4 mg via INTRAVENOUS

## 2015-06-23 MED ORDER — SURGIFOAM 100 EX MISC
CUTANEOUS | Status: DC | PRN
Start: 2015-06-23 — End: 2015-06-23
  Administered 2015-06-23: 1 via TOPICAL

## 2015-06-23 MED ORDER — DIBUCAINE 1 % RE OINT
TOPICAL_OINTMENT | RECTAL | Status: DC | PRN
  Administered 2015-06-23: 1 via RECTAL

## 2015-06-23 SURGICAL SUPPLY — 44 items
BENZOIN TINCTURE PRP APPL 2/3 (GAUZE/BANDAGES/DRESSINGS) ×2 IMPLANT
BLADE SURG 15 STRL LF DISP TIS (BLADE) ×1 IMPLANT
BLADE SURG 15 STRL SS (BLADE) ×1
CANISTER SUCTION 2500CC (MISCELLANEOUS) ×2 IMPLANT
CLEANER TIP ELECTROSURG 2X2 (MISCELLANEOUS) ×2 IMPLANT
CLIP TI LARGE 6 (CLIP) ×4 IMPLANT
COVER MAYO STAND STRL (DRAPES) ×2 IMPLANT
COVER SURGICAL LIGHT HANDLE (MISCELLANEOUS) ×2 IMPLANT
DRAPE LAPAROTOMY T 102X78X121 (DRAPES) ×2 IMPLANT
DRAPE UTILITY XL STRL (DRAPES) ×2 IMPLANT
DRSG PAD ABDOMINAL 8X10 ST (GAUZE/BANDAGES/DRESSINGS) ×4 IMPLANT
ELECT REM PT RETURN 9FT ADLT (ELECTROSURGICAL) ×2
ELECTRODE REM PT RTRN 9FT ADLT (ELECTROSURGICAL) ×1 IMPLANT
GAUZE SPONGE 4X4 12PLY STRL (GAUZE/BANDAGES/DRESSINGS) ×4 IMPLANT
GAUZE SPONGE 4X4 16PLY XRAY LF (GAUZE/BANDAGES/DRESSINGS) ×2 IMPLANT
GLOVE BIO SURGEON STRL SZ7 (GLOVE) ×2 IMPLANT
GLOVE BIOGEL PI IND STRL 8 (GLOVE) ×1 IMPLANT
GLOVE BIOGEL PI IND STRL 8.5 (GLOVE) ×1 IMPLANT
GLOVE BIOGEL PI INDICATOR 8 (GLOVE) ×1
GLOVE BIOGEL PI INDICATOR 8.5 (GLOVE) ×1
GLOVE ECLIPSE 7.5 STRL STRAW (GLOVE) ×4 IMPLANT
GOWN STRL REUS W/ TWL LRG LVL3 (GOWN DISPOSABLE) ×2 IMPLANT
GOWN STRL REUS W/ TWL XL LVL3 (GOWN DISPOSABLE) ×1 IMPLANT
GOWN STRL REUS W/TWL LRG LVL3 (GOWN DISPOSABLE) ×2
GOWN STRL REUS W/TWL XL LVL3 (GOWN DISPOSABLE) ×1
KIT BASIN OR (CUSTOM PROCEDURE TRAY) ×2 IMPLANT
KIT ROOM TURNOVER OR (KITS) ×2 IMPLANT
LOOP VESSEL MAXI BLUE (MISCELLANEOUS) ×2 IMPLANT
NS IRRIG 1000ML POUR BTL (IV SOLUTION) ×2 IMPLANT
PACK LITHOTOMY IV (CUSTOM PROCEDURE TRAY) ×2 IMPLANT
PAD ARMBOARD 7.5X6 YLW CONV (MISCELLANEOUS) ×4 IMPLANT
PENCIL BUTTON HOLSTER BLD 10FT (ELECTRODE) ×2 IMPLANT
SPECIMEN JAR SMALL (MISCELLANEOUS) ×2 IMPLANT
SPONGE HEMORRHOID 8X3CM (HEMOSTASIS) ×2 IMPLANT
SURGILUBE 2OZ TUBE FLIPTOP (MISCELLANEOUS) ×2 IMPLANT
SUT CHROMIC 3 0 SH 27 (SUTURE) ×2 IMPLANT
SYR BULB 3OZ (MISCELLANEOUS) ×2 IMPLANT
TOWEL OR 17X24 6PK STRL BLUE (TOWEL DISPOSABLE) ×2 IMPLANT
TOWEL OR 17X26 10 PK STRL BLUE (TOWEL DISPOSABLE) ×2 IMPLANT
TRAY PROCTOSCOPIC FIBER OPTIC (SET/KITS/TRAYS/PACK) IMPLANT
TUBE CONNECTING 12X1/4 (SUCTIONS) ×2 IMPLANT
UNDERPAD 30X30 INCONTINENT (UNDERPADS AND DIAPERS) ×2 IMPLANT
WATER STERILE IRR 1000ML POUR (IV SOLUTION) IMPLANT
YANKAUER SUCT BULB TIP NO VENT (SUCTIONS) ×2 IMPLANT

## 2015-06-23 NOTE — Anesthesia Postprocedure Evaluation (Signed)
Anesthesia Post Note  [name]  Procedure(s) Performed: Procedure(s) (LRB): EXAM UNDER ANESTHESIA WITH POSSIBLE FISTULOTOMY (N/A)  Patient location during evaluation: PACU Anesthesia Type: General Level of consciousness: sedated and patient cooperative Pain management: pain level controlled Vital Signs Assessment: post-procedure vital signs reviewed and stable Respiratory status: spontaneous breathing Cardiovascular status: stable Anesthetic complications: no    Last Vitals:  Filed Vitals:   06/23/15 1701 06/23/15 1947  BP: 143/96 142/80  Pulse: 74 72  Temp:  36.8 C  Resp: 18 20    Last Pain: There were no vitals filed for this visit.               Nolon Nations

## 2015-06-23 NOTE — H&P (Signed)
[name] 04/15/2015 8:59 AM Location: [facility] Patient #: [id number] DOB: 04/15/1961 Single / Language: Cleophus Molt / Race: Black or African American Male   History of Present Illness [name]. [name] Skains MD; 04/15/2015 9:18 AM) The patient is a 55 year old male.  Note:Patient has chronic drainage from previously drained perirectal abbscess site since April 2016. seen by primary care but no improvement.  Other Problems Elbert Ewings, CMA; 04/15/2015 8:59 AM) Asthma Diabetes Mellitus High blood pressure Umbilical Hernia Repair  Past Surgical History Elbert Ewings, CMA; 04/15/2015 8:59 AM) Ventral / Umbilical Hernia Surgery Bilateral.  Diagnostic Studies History Elbert Ewings, CMA; 04/15/2015 8:59 AM) Colonoscopy within last year  Allergies Elbert Ewings, CMA; 04/15/2015 9:00 AM) Shellfish Hives, Swelling.  Medication History Elbert Ewings, CMA; 04/15/2015 9:02 AM) Allopurinol (300MG  Tablet, Oral) Active. BD Insulin Syringe Ultrafine (30G X 1/2"0.3 ML Misc,) Active. Accu-Chek Aviva Plus (In Vitro) Active. Accu-Chek FastClix Lancets Active. NovoLOG Mix 70/30 ((70-30) 100UNIT/ML Suspension, Subcutaneous) Active. Meclizine HCl (25MG  Tablet, Oral) Active. Losartan Potassium-HCTZ (50-12.5MG  Tablet, Oral) Active. Eplerenone (50MG  Tablet, Oral) Active. Allegra (180MG  Tablet, Oral) Active. Vitamin C (500MG  Tablet, Oral) Active. Colchicine (0.6MG  Capsule, Oral) Active. Medications Reconciled  Social History Elbert Ewings, [place]; 04/15/2015 8:59 AM) Alcohol use Occasional alcohol use. Caffeine use Carbonated beverages. No drug use Tobacco use Never smoker.  Family History Elbert Ewings, [place]; 04/15/2015 8:59 AM) Diabetes Mellitus Mother.    Review of Systems Elbert Ewings CMA; 04/15/2015 8:59 AM) Skin Not Present- Change in Wart/Mole, Dryness, Hives, Jaundice, New Lesions, Non-Healing Wounds, Rash and Ulcer. HEENT Present- Ringing in the Ears and Seasonal  Allergies. Not Present- Earache, Hearing Loss, Hoarseness, Nose Bleed, Oral Ulcers, Sinus Pain, Sore Throat, Visual Disturbances, Wears glasses/contact lenses and Yellow Eyes. Respiratory Present- Wheezing. Not Present- Bloody sputum, Chronic Cough, Difficulty Breathing and Snoring. Breast Not Present- Breast Mass, Breast Pain, Nipple Discharge and Skin Changes. Cardiovascular Present- Difficulty Breathing Lying Down, Shortness of Breath and Swelling of Extremities. Not Present- Chest Pain, Leg Cramps, Palpitations and Rapid Heart Rate. Gastrointestinal Present- Bloating and Excessive gas. Not Present- Abdominal Pain, Bloody Stool, Change in Bowel Habits, Chronic diarrhea, Constipation, Difficulty Swallowing, Gets full quickly at meals, Hemorrhoids, Indigestion, Nausea, Rectal Pain and Vomiting. Male Genitourinary Present- Frequency, Urgency and Urine Leakage. Not Present- Blood in Urine, Change in Urinary Stream, Impotence, Nocturia and Painful Urination. Musculoskeletal Present- Swelling of Extremities. Not Present- Back Pain, Joint Pain, Joint Stiffness, Muscle Pain and Muscle Weakness. Neurological Not Present- Decreased Memory, Fainting, Headaches, Numbness, Seizures, Tingling, Tremor, Trouble walking and Weakness. Psychiatric Not Present- Anxiety, Bipolar, Change in Sleep Pattern, Depression, Fearful and Frequent crying. Endocrine Not Present- Cold Intolerance, Excessive Hunger, Hair Changes, Heat Intolerance, Hot flashes and New Diabetes. Hematology Not Present- Easy Bruising, Excessive bleeding, Gland problems, HIV and Persistent Infections.  Vitals Elbert Ewings CMA; 04/15/2015 9:02 AM) 04/15/2015 9:02 AM Weight: 268.4 lb Height: 70in Body Surface Area: 2.37 m Body Mass Index: 38.51 kg/m  Pulse: 100 (Regular)  BP: 142/82 (Sitting, Left Arm, Standard) Physical Exam (Donley O. Quirino Kakos MD; 04/15/2015 9:23 AM) Rectal Anorectal Exam External - anorectal fistula(As described on the right  side extending up towards the anus). Note: Patient has drainage site about 4 cm from the anus, right side. no induration or erythema. pus expresses and able to get hemostat in the wound towards the anus Assessment & Plan Kathryne Eriksson. [name] Skains MD; 04/15/2015 9:36 AM) ANORECTAL FISTULA (K60.5) Impression: Chronic anorectal fistula. Needs EUA and fistulotomy. Needs also cardiac clearance.  Only anti-platelet medication is ASA Current Plans:  EUA with possible fistulotomy Risks and benefits explained to the patient.  There was a mixup in his scheduled time, and the patient showed up today, ready for surgery, having taken his enema  The patient does not have a ride for home and will need to either get a taxi once fully awake or stay overnight.  Kathryne Eriksson. Dahlia Bailiff, MD, [place] [phone] [facility]

## 2015-06-23 NOTE — Op Note (Signed)
OPERATIVE REPORT  DATE OF OPERATION: 06/23/2015  PATIENT:  [name]  55 y.o. male  PRE-OPERATIVE DIAGNOSIS:  Recurrent perirectal abscess  POST-OPERATIVE DIAGNOSIS:  perirectal abscess with anorectal fistula  PROCEDURE:  Procedure(s): EXAM UNDER ANESTHESIA WITH FISTULOTOMY and placement of seton  SURGEON:  Surgeon(s): Judeth Horn, MD  ASSISTANT: None  ANESTHESIA:   general  EBL: <20 ml  BLOOD ADMINISTERED: none  DRAINS: Blue vascular tapes placed as Setons   SPECIMEN:  No Specimen  COUNTS CORRECT:  YES  PROCEDURE DETAILS: The patient was taken to the operating room and placed on the table in supine position. After an adequate general endotracheal anesthetic was administered he was placed in the lithotomy position and prepped and draped in the usual sterile manner.  A proper timeout was performed identifying the patient and procedure to be performed. An anal speculum was passed into the anus through the sphincter. An endorectal probe was passed through the opening on the left anterior medial gluteal cheek approximately 4 cm from the anal verge. We were able to easily pass it into the anorectal mucosal posteriorly. We subsequently replaced the probe with a hemostat clamp which easily passed through the fistulous tract into the posterior rectal vault. We passed 2 blue colored vascular tapes through the fistulous tract and tied them off as setons and secured them in place with large hemoclips 3 on each of the 2 seton vascular tapes.  We irrigated the wound with saline solution. A Gelfoam covered with Debbie cane ointment was passed into the rectum and then sterile dressing applied to the wounds.  All counts were correct.  PATIENT DISPOSITION:  PACU - hemodynamically stable.   Doroteo Milbern Doescher 1/16/20171:28 PM

## 2015-06-23 NOTE — Anesthesia Procedure Notes (Addendum)
Procedure Name: Intubation Performed by: Judeth Cornfield T Pre-anesthesia Checklist: Patient identified, Emergency Drugs available, Patient being monitored, Suction available and Timeout performed Patient Re-evaluated:Patient Re-evaluated prior to inductionOxygen Delivery Method: Circle system utilized Preoxygenation: Pre-oxygenation with 100% oxygen Intubation Type: IV induction Ventilation: Mask ventilation without difficulty and Oral airway inserted - appropriate to patient size Laryngoscope Size: Mac and 4 Grade View: Grade II Tube type: Oral Tube size: 7.5 mm Number of attempts: 1 Airway Equipment and Method: Stylet Placement Confirmation: ETT inserted through vocal cords under direct vision,  positive ETCO2,  CO2 detector and breath sounds checked- equal and bilateral Secured at: 22 cm Tube secured with: Tape Dental Injury: Teeth and Oropharynx as per pre-operative assessment    Anesthesia Regional Block:  Axillary brachial plexus block  Pre-Anesthetic Checklist: ,, timeout performed, Correct Patient, Correct Site, Correct Laterality, Correct Procedure, Correct Position, site marked, Risks and benefits discussed, Surgical consent,  Pre-op evaluation,  Post-op pain management  Laterality: Right  Prep: chloraprep       Needles:  Injection technique: Single-shot  Needle Type: Stimulator Needle - 40     Needle Length: 4cm 4 cm Needle Gauge: 22 and 22 G    Additional Needles:  Procedures: ultrasound guided (picture in chart) Axillary brachial plexus block Narrative:  Injection made incrementally with aspirations every 5 mL.  Performed by: Personally  Anesthesiologist: Nolon Nations  Additional Notes: BP cuff, EKG monitors applied. Sedation begun. Nerve location verified with U/S. Anesthetic injected incrementally, slowly , and after neg aspirations under direct u/s guidance. Good perineural spread. Tolerated well.

## 2015-06-23 NOTE — Discharge Instructions (Signed)
Surgical Procedures for Fistulotomy, Care After Refer to this sheet in the next few weeks. These instructions provide you with information about caring for yourself after your procedure. Your health care provider may also give you more specific instructions. Your treatment has been planned according to current medical practices, but problems sometimes occur. Call your health care provider if you have any problems or questions after your procedure. WHAT TO EXPECT AFTER THE PROCEDURE After your procedure, it is common to have:  Rectal pain.  Pain when you are having a bowel movement.  Rectal bleeding.  There are two blue rubber strings attached to you anal area--do not try to remove these or cut them  Your dressing will be soiled and can be changed as needed. HOME CARE INSTRUCTIONS Medicines  Take over-the-counter and prescription medicines only as told by your health care provider.  Do not drive or operate heavy machinery while taking prescription pain medicine.  Use a stool softener or a bulk laxative as told by your health care provider. Activities  Rest at home. Return to your normal activities as told by your health care provider.  Do not lift anything that is heavier than 10 lb (4.5 kg).  Do not sit for long periods of time. Take a walk every day or as told by your health care provider.  Do not strain to have a bowel movement. Do not spend a long time sitting on the toilet. Eating and Drinking  Eat foods that contain fiber, such as whole grains, beans, nuts, fruits, and vegetables.  Drink enough fluid to keep your urine clear or pale yellow. General Instructions  Sit in a warm bath 2-3 times per day to relieve soreness or itching, and after every bowel movement.  Keep all follow-up visits as told by your health care provider. This is important. SEEK MEDICAL CARE IF:  Your pain medicine is not helping.  You have a fever or chills.  You become constipated.  You have  trouble passing urine. SEEK IMMEDIATE MEDICAL CARE IF:  You have very bad rectal pain.  You have heavy bleeding from your rectum.   This information is not intended to replace advice given to you by your health care provider. Make sure you discuss any questions you have with your health care provider.   Document Released: 08/14/2003 Document Revised: 02/12/2015 Document Reviewed: 08/19/2014 Elsevier Interactive Patient Education [employer].

## 2015-06-23 NOTE — Transfer of Care (Signed)
Immediate Anesthesia Transfer of Care Note  [name]  Procedure(s) Performed: Procedure(s): EXAM UNDER ANESTHESIA WITH POSSIBLE FISTULOTOMY (N/A)  Patient Location: PACU  Anesthesia Type:General  Level of Consciousness: awake, patient cooperative and responds to stimulation  Airway & Oxygen Therapy: Patient Spontanous Breathing and Patient connected to face mask oxygen  Post-op Assessment: Report given to RN and Post -op Vital signs reviewed and stable  Post vital signs: Reviewed and stable  Last Vitals:  Filed Vitals:   06/23/15 0651  BP: 147/100  Temp: 36.8 C  Resp: 18    Complications: No apparent anesthesia complications

## 2015-06-23 NOTE — Progress Notes (Signed)
Aaron Edelman from [facility] called and states a magnet is ok to use if needed, and that he is in the hospital if needed.

## 2015-06-23 NOTE — Progress Notes (Signed)
[facility] called and Aaron Edelman to return call.

## 2015-06-23 NOTE — Anesthesia Preprocedure Evaluation (Addendum)
Anesthesia Evaluation  Patient identified by MRN, date of birth, ID band Patient awake    Reviewed: Allergy & Precautions, NPO status , Patient's Chart, lab work & pertinent test results  History of Anesthesia Complications Negative for: history of anesthetic complications  Airway Mallampati: I  TM Distance: >3 FB Neck ROM: Full    Dental  (+) Teeth Intact, Dental Advisory Given,    Pulmonary neg shortness of breath, asthma , sleep apnea , pneumonia, COPD,  COPD inhaler,    breath sounds clear to auscultation       Cardiovascular hypertension, Pt. on medications (-) angina+CHF  (-) Past MI + dysrhythmias + pacemaker  Rhythm:Irregular  Echo 05/07/2015 - Left ventricle: Severe LVH with some septal predominance Thecavity size was normal. There was severe concentric hypertrophy.Systolic function was normal. The estimated ejection fraction wasin the range of 55% to 60%. - Mitral valve: No SAM. - Left atrium: The atrium was mildly dilated. - Impressions: Abnormal GLS with septal sparing consistant withHOCM.   Neuro/Psych negative neurological ROS  negative psych ROS   GI/Hepatic negative GI ROS, Neg liver ROS,   Endo/Other  diabetes, Well Controlled, Type 2  Renal/GU Renal InsufficiencyRenal disease     Musculoskeletal  (+) Arthritis ,   Abdominal (+)  Abdomen: soft. Bowel sounds: normal.  Peds  Hematology  (+) anemia ,   Anesthesia Other Findings   Reproductive/Obstetrics                           Anesthesia Physical  Anesthesia Plan  ASA: IV  Anesthesia Plan: General   Post-op Pain Management:    Induction: Intravenous  Airway Management Planned: Oral ETT  Additional Equipment: None  Intra-op Plan:   Post-operative Plan: Extubation in OR  Informed Consent: I have reviewed the patients History and Physical, chart, labs and discussed the procedure including the risks,  benefits and alternatives for the proposed anesthesia with the patient or authorized representative who has indicated his/her understanding and acceptance.   Dental advisory given  Plan Discussed with: CRNA  Anesthesia Plan Comments:        Anesthesia Quick Evaluation

## 2015-06-24 ENCOUNTER — Encounter (HOSPITAL_COMMUNITY): Payer: Self-pay | Admitting: General Surgery

## 2015-06-24 DIAGNOSIS — I10 Essential (primary) hypertension: Secondary | ICD-10-CM | POA: Diagnosis not present

## 2015-06-24 DIAGNOSIS — Z794 Long term (current) use of insulin: Secondary | ICD-10-CM | POA: Diagnosis not present

## 2015-06-24 DIAGNOSIS — M1990 Unspecified osteoarthritis, unspecified site: Secondary | ICD-10-CM | POA: Diagnosis not present

## 2015-06-24 DIAGNOSIS — J449 Chronic obstructive pulmonary disease, unspecified: Secondary | ICD-10-CM | POA: Diagnosis not present

## 2015-06-24 DIAGNOSIS — K611 Rectal abscess: Secondary | ICD-10-CM | POA: Diagnosis not present

## 2015-06-24 DIAGNOSIS — E119 Type 2 diabetes mellitus without complications: Secondary | ICD-10-CM | POA: Diagnosis not present

## 2015-06-24 DIAGNOSIS — J45909 Unspecified asthma, uncomplicated: Secondary | ICD-10-CM | POA: Diagnosis not present

## 2015-06-24 DIAGNOSIS — Z79899 Other long term (current) drug therapy: Secondary | ICD-10-CM | POA: Diagnosis not present

## 2015-06-24 DIAGNOSIS — G4730 Sleep apnea, unspecified: Secondary | ICD-10-CM | POA: Diagnosis not present

## 2015-06-24 LAB — GLUCOSE, CAPILLARY
Glucose-Capillary: 276 mg/dL — ABNORMAL HIGH (ref 65–99)
Glucose-Capillary: 197 mg/dL — ABNORMAL HIGH (ref 65–99)

## 2015-06-24 NOTE — Progress Notes (Signed)
Pt doing well. Pt given D/C instructions with Rx, verbal understanding was provided. Pt's rectal dressing was clean and dry. Pt's IV was removed prior to D/C. Pt D/C'd home via wheelchair @ 1030 per MD order. Pt is stable @ D/C and has no other needs at this time. Holli Humbles, RN

## 2015-06-24 NOTE — Progress Notes (Signed)
Inpatient Diabetes Program Recommendations  AACE/ADA: New Consensus Statement on Inpatient Glycemic Control (2015)  Target Ranges:  Prepandial:   less than 140 mg/dL      Peak postprandial:   less than 180 mg/dL (1-2 hours)      Critically ill patients:  140 - 180 mg/dL   Review of Glycemic Control:  Results for NEILSON, MELDE (MRN [id number]) as of 06/24/2015 09:03  Ref. Range 06/23/2015 16:59 06/23/2015 18:02 06/23/2015 21:28 06/24/2015 00:07 06/24/2015 08:19  Glucose-Capillary Latest Ref Range: 65-99 mg/dL 159 (H) 253 (H) 386 (H) 276 (H) 197 (H)   Diabetes history: Type 2 diabetes Outpatient Diabetes medications: Novolog 70/30-20 units bid  Inpatient Diabetes Program Recommendations:   Please restart Novolog 70/30 20 units bid.  Also please add Novolog moderate tid with meals and HS.  Thanks, Adah Perl, RN, BC-ADM Inpatient Diabetes Coordinator Pager [phone] (8a-5p)

## 2015-07-01 ENCOUNTER — Telehealth: Payer: Self-pay | Admitting: Family Medicine

## 2015-07-01 NOTE — Telephone Encounter (Signed)
Patient called requesting to speak to nurse regarding wound check, please f/u

## 2015-07-02 NOTE — Telephone Encounter (Signed)
Pt has appointment schedule with PCP

## 2015-07-04 ENCOUNTER — Encounter (HOSPITAL_COMMUNITY)
Admission: RE | Admit: 2015-07-04 | Discharge: 2015-07-04 | Disposition: A | Discharge status: Home / Self Care | Payer: Medicare Other | Source: Ambulatory Visit | Attending: Nephrology | Admitting: Nephrology

## 2015-07-04 ENCOUNTER — Encounter (HOSPITAL_COMMUNITY): Payer: Self-pay

## 2015-07-04 DIAGNOSIS — E1101 Type 2 diabetes mellitus with hyperosmolarity with coma: Secondary | ICD-10-CM | POA: Diagnosis not present

## 2015-07-04 DIAGNOSIS — E7521 Fabry (-Anderson) disease: Secondary | ICD-10-CM | POA: Diagnosis not present

## 2015-07-04 LAB — COMPREHENSIVE METABOLIC PANEL
ALT: 21 U/L (ref 17–63)
AST: 28 U/L (ref 15–41)
Albumin: 3 g/dL — ABNORMAL LOW (ref 3.5–5.0)
Alkaline Phosphatase: 78 U/L (ref 38–126)
Anion gap: 11 (ref 5–15)
BUN: 26 mg/dL — ABNORMAL HIGH (ref 6–20)
CO2: 23 mmol/L (ref 22–32)
Calcium: 8.8 mg/dL — ABNORMAL LOW (ref 8.9–10.3)
Chloride: 105 mmol/L (ref 101–111)
Creatinine, Ser: 1.43 mg/dL — ABNORMAL HIGH (ref 0.61–1.24)
GFR calc Af Amer: >60 mL/min (ref >60)
GFR calc non Af Amer: 54 mL/min — ABNORMAL LOW (ref >60)
Glucose, Bld: 267 mg/dL — ABNORMAL HIGH (ref 65–99)
Potassium: 4.2 mmol/L (ref 3.5–5.1)
Sodium: 139 mmol/L (ref 135–145)
Total Bilirubin: 0.4 mg/dL (ref 0.3–1.2)
Total Protein: 7.2 g/dL (ref 6.5–8.1)

## 2015-07-04 LAB — URINALYSIS, ROUTINE W REFLEX MICROSCOPIC
Bilirubin Urine: NEGATIVE
Glucose, UA: NEGATIVE mg/dL
Hgb urine dipstick: NEGATIVE
Ketones, ur: NEGATIVE mg/dL
Leukocytes, UA: NEGATIVE
Nitrite: NEGATIVE
Protein, ur: 100 mg/dL — AB
Specific Gravity, Urine: 1.011 (ref 1.005–1.030)
pH: 6 (ref 5.0–8.0)

## 2015-07-04 LAB — URINE MICROSCOPIC-ADD ON
Bacteria, UA: NONE SEEN
RBC / HPF: NONE SEEN RBC/hpf (ref 0–5)
Squamous Epithelial / LPF: NONE SEEN

## 2015-07-04 LAB — PHOSPHORUS: Phosphorus: 4 mg/dL (ref 2.5–4.6)

## 2015-07-04 MED ORDER — AGALSIDASE BETA 5 MG IV SOLR
115.0000 mg | INTRAVENOUS | Status: DC
  Administered 2015-07-04: 115 mg via INTRAVENOUS
  Filled 2015-07-04: qty 21

## 2015-07-04 MED ORDER — SODIUM CHLORIDE 0.9 % IV SOLN
INTRAVENOUS | Status: DC
  Administered 2015-07-04: 09:00:00 via INTRAVENOUS

## 2015-07-11 ENCOUNTER — Encounter: Payer: Self-pay | Admitting: Family Medicine

## 2015-07-11 ENCOUNTER — Ambulatory Visit: Payer: Medicare Other | Attending: Family Medicine | Admitting: Family Medicine

## 2015-07-11 ENCOUNTER — Other Ambulatory Visit: Payer: Self-pay | Admitting: Family Medicine

## 2015-07-11 VITALS — BP 115/75 | HR 73 | Temp 98.4°F | Resp 16 | Ht 70.0 in | Wt 268.0 lb

## 2015-07-11 DIAGNOSIS — M1009 Idiopathic gout, multiple sites: Secondary | ICD-10-CM

## 2015-07-11 DIAGNOSIS — E1165 Type 2 diabetes mellitus with hyperglycemia: Secondary | ICD-10-CM

## 2015-07-11 DIAGNOSIS — E118 Type 2 diabetes mellitus with unspecified complications: Secondary | ICD-10-CM

## 2015-07-11 DIAGNOSIS — E7521 Fabry (-Anderson) disease: Secondary | ICD-10-CM | POA: Diagnosis not present

## 2015-07-11 DIAGNOSIS — Z794 Long term (current) use of insulin: Secondary | ICD-10-CM | POA: Diagnosis not present

## 2015-07-11 DIAGNOSIS — Z79899 Other long term (current) drug therapy: Secondary | ICD-10-CM | POA: Insufficient documentation

## 2015-07-11 DIAGNOSIS — J449 Chronic obstructive pulmonary disease, unspecified: Secondary | ICD-10-CM | POA: Diagnosis not present

## 2015-07-11 DIAGNOSIS — M109 Gout, unspecified: Secondary | ICD-10-CM

## 2015-07-11 DIAGNOSIS — E119 Type 2 diabetes mellitus without complications: Secondary | ICD-10-CM | POA: Insufficient documentation

## 2015-07-11 DIAGNOSIS — K605 Anorectal fistula, unspecified: Secondary | ICD-10-CM

## 2015-07-11 DIAGNOSIS — Z9889 Other specified postprocedural states: Secondary | ICD-10-CM | POA: Insufficient documentation

## 2015-07-11 DIAGNOSIS — Z1159 Encounter for screening for other viral diseases: Secondary | ICD-10-CM

## 2015-07-11 DIAGNOSIS — Z7982 Long term (current) use of aspirin: Secondary | ICD-10-CM | POA: Insufficient documentation

## 2015-07-11 DIAGNOSIS — G8918 Other acute postprocedural pain: Secondary | ICD-10-CM

## 2015-07-11 DIAGNOSIS — K6289 Other specified diseases of anus and rectum: Secondary | ICD-10-CM | POA: Insufficient documentation

## 2015-07-11 DIAGNOSIS — IMO0002 Reserved for concepts with insufficient information to code with codable children: Secondary | ICD-10-CM

## 2015-07-11 LAB — GLUCOSE, POCT (MANUAL RESULT ENTRY): POC Glucose: 160 mg/dl — AB (ref 70–99)

## 2015-07-11 MED ORDER — LIDOCAINE 5 % EX OINT: 1.0000 "application " | TOPICAL_OINTMENT | Freq: Every day | CUTANEOUS | Status: DC | PRN

## 2015-07-11 MED ORDER — ALLOPURINOL 300 MG PO TABS: 300.0000 mg | ORAL_TABLET | Freq: Every day | ORAL | Status: DC

## 2015-07-11 NOTE — Progress Notes (Signed)
Subjective:  Patient ID: DEMITRUS [name], male    DOB: May 26, 1961  Age: 55 y.o. MRN: [id number]  CC: Cyst   HPI MILUS RAHLF presents has diabetes, Fabry disease and COPD he present for:  1.  Perirectal pain: he has chronic perirectal fistula. He is three weeks s/p fistulotomy. He has not followed up with his surgeon. He is having rectal pain with sitting, bowel movements, coughing and sneezing. No fever or chills. No swelling.    Past Surgical History  Procedure Laterality Date  . Cardiac catheterization  03-12-2011  DR Daneen Schick    HYPERTROPHIC CARDIOMYOPATHY WITH LV CAVITY  APPEARANCE CONSISTANT WITH SIGNIFICANT APICAL HYPERTROPHY/ NORMAL LVSF / EF 55%/ EVIDENCE OF DIASTOLIC DYSFUNCTION WITH EDP OF 23-72mmHg AFTER A-WAVE/ NORMAL CORONARY ARTERIES  . Umbilical hernia repair  03/22/2012    Procedure: HERNIA REPAIR UMBILICAL ADULT;  Surgeon: Leighton Ruff, MD;  Location: [facility];  Service: General;  Laterality: N/A;  Umbilical Hernia Repair   . Hernia repair  0000000    Umbilical Hernia Repair  . Irrigation and debridement abscess N/A 07/27/2013    Procedure: IRRIGATION AND DEBRIDEMENT OF SKIN, SOFT TISSUE AND MUSCLES OF UPPER BACK (11X19X4cm) WITH 10 BLADE AND PULSATILE LAVAGE ;  Surgeon: Gayland Curry, MD;  Location: [facility];  Service: General;  Laterality: N/A;  . Insert / replace / remove pacemaker  02/12/2011    SJM implanted by Dr Rayann Heman for Mobitz II second degree AV block and syncope  . Incision and drainage perirectal abscess Left 07/29/2014    Procedure: IRRIGATION AND DEBRIDEMENT PERIRECTAL ABSCESS;  Surgeon: Doreen Salvage, MD;  Location: [place];  Service: General;  Laterality: Left;  Prone position  . Evaluation under anesthesia with fistulectomy N/A 06/23/2015    Procedure: EXAM UNDER ANESTHESIA WITH POSSIBLE FISTULOTOMY;  Surgeon: Judeth Horn, MD;  Location: [place];  Service: General;  Laterality: N/A;   Outpatient Prescriptions Prior to Visit  Medication Sig Dispense Refill  .  acetaminophen (TYLENOL) 500 MG tablet Take 500 mg by mouth every 14 (fourteen) days. Only taking 1 hour before infusion.  AS premed for Fabrazyme    . Agalsidase beta (FABRAZYME IV) Inject 115 mg into the vein every 14 (fourteen) days.     . ALBUTEROL SULFATE HFA IN Inhale 1 puff into the lungs daily as needed (shortness of breath or wheezing). For shortness of breath    . allopurinol (ZYLOPRIM) 300 MG tablet Take 300 mg by mouth daily.    [name] Kitchen aspirin 81 MG tablet Take 81 mg by mouth daily.     [name] Kitchen eplerenone (INSPRA) 50 MG tablet Take 1 tablet (50 mg total) by mouth daily. 30 tablet 5  . fexofenadine (ALLEGRA) 180 MG tablet Take 180 mg by mouth every 14 (fourteen) days.     [name] Kitchen glucose blood (ACCU-CHEK AVIVA) test strip Use as instructed 100 each 12  . insulin aspart (NOVOLOG) 100 UNIT/ML injection Inject 0-15 Units into the skin 3 (three) times daily with meals. (Patient taking differently: Inject 0-15 Units into the skin 2 (two) times daily. ) 10 mL 11  . insulin aspart protamine- aspart (NOVOLOG MIX 70/30) (70-30) 100 UNIT/ML injection Inject 0.2 mLs (20 Units total) into the skin 2 (two) times daily with a meal. (Patient taking differently: Inject 20 Units into the skin 2 (two) times daily with a meal. ) 10 mL 12  . losartan-hydrochlorothiazide (HYZAAR) 50-12.5 MG tablet Take 1 tablet by mouth daily.  6  . meclizine (ANTIVERT)  25 MG tablet Take 25 mg by mouth every 6 (six) hours as needed for dizziness or nausea.   1  . HYDROcodone-acetaminophen (NORCO/VICODIN) 5-325 MG tablet Take 1-2 tablets by mouth every 4 (four) hours as needed for moderate pain or severe pain. 40 tablet 0   No facility-administered medications prior to visit.    ROS Review of Systems  Constitutional: Negative for fever, chills, fatigue and unexpected weight change.  Eyes: Negative for visual disturbance.  Respiratory: Negative for cough and shortness of breath.   Cardiovascular: Negative for chest pain, palpitations and  leg swelling.  Gastrointestinal: Positive for rectal pain. Negative for nausea, vomiting, abdominal pain, diarrhea, constipation and blood in stool.  Endocrine: Negative for polydipsia, polyphagia and polyuria.  Musculoskeletal: Negative for myalgias, back pain, arthralgias, gait problem and neck pain.  Skin: Negative for rash.  Allergic/Immunologic: Negative for immunocompromised state.  Hematological: Negative for adenopathy. Does not bruise/bleed easily.  Psychiatric/Behavioral: Negative for suicidal ideas, sleep disturbance and dysphoric mood. The patient is not nervous/anxious.     Objective:  BP 115/75 mmHg  Pulse 73  Temp(Src) 98.4 F (36.9 C) (Oral)  Resp 16  Ht 5\' 10"  (1.778 m)  Wt 268 lb (121.564 kg)  BMI 38.45 kg/m2  SpO2 99%  BP/Weight 07/11/2015 07/04/2015 [id number]  Systolic BP [id number]  Diastolic BP 75 88 79  Wt. (Lbs) 268 266 -  BMI 38.45 38.17 -   Physical Exam  Constitutional: He appears well-developed and well-nourished. No distress.  HENT:  Head: Normocephalic and atraumatic.  Neck: Normal range of motion. Neck supple.  Cardiovascular: Normal rate, regular rhythm, normal heart sounds and intact distal pulses.   Pulmonary/Chest: Effort normal.  Genitourinary:     Musculoskeletal: He exhibits no edema.  Neurological: He is alert.  Skin: Skin is warm and dry. No rash noted. No erythema.  Psychiatric: He has a normal mood and affect.    Lab Results  Component Value Date   HGBA1C 10.50 04/21/2015   CBG 160  Assessment & Plan:    Follow-up: No Follow-up on file.   Boykin Nearing MD   S/p

## 2015-07-11 NOTE — Assessment & Plan Note (Addendum)
A: anorectal fistula s/p fistulectomy. He has 2 blue setons each with 3 metal clips. He is having post op pain. No evidence of infection.  P:  F/u appt scheduled with his surgeon  Avoid straining Pain control with topical lidocaine gel Oral pain medicine with stool softener as needed Rx for donut provided

## 2015-07-11 NOTE — Patient Instructions (Addendum)
Becket was seen today for cyst.  Diagnoses and all orders for this visit:  Uncontrolled type 2 diabetes mellitus with complication, with long-term current use of insulin (HCC) -     Glucose (CBG)  Need for hepatitis C screening test -     Hepatitis C antibody, reflex  Anorectal fistula -     lidocaine (XYLOCAINE) 5 % ointment; Apply 1 application topically daily as needed.  Gout, arthritis -     allopurinol (ZYLOPRIM) 300 MG tablet; Take 1 tablet (300 mg total) by mouth daily.  Post-op pain   General surgery post-op follow up with Dr. Hulen Skains  07/22/15 at 8:55 AM     Dr. Adrian Blackwater

## 2015-07-11 NOTE — Assessment & Plan Note (Addendum)
Post op rectal pain w/o abscess or evidence of infection Op note reviewed   F/u appt scheduled with patient's surgeon  Topical lidocaine Oral pain medicine with stool softener Donut to sit on ordered

## 2015-07-11 NOTE — Progress Notes (Signed)
F/U surgery, cyst removal  Pain scale #5 No tobacco user  No suicidal thoughts in the past two weeks

## 2015-07-12 LAB — HEPATITIS C ANTIBODY: HCV Ab: NEGATIVE

## 2015-07-16 ENCOUNTER — Telehealth: Payer: Self-pay | Admitting: *Deleted

## 2015-07-16 NOTE — Telephone Encounter (Signed)
-----   Message from Boykin Nearing, MD sent at 07/13/2015  9:37 AM EST ----- Screening hep C negative

## 2015-07-16 NOTE — Telephone Encounter (Signed)
Patient returning call to nurse...please follow up

## 2015-07-16 NOTE — Telephone Encounter (Signed)
LVM to return call.

## 2015-07-18 ENCOUNTER — Encounter (HOSPITAL_COMMUNITY)
Admission: RE | Admit: 2015-07-18 | Discharge: 2015-07-18 | Disposition: A | Discharge status: Home / Self Care | Payer: Medicare Other | Source: Ambulatory Visit | Attending: Nephrology | Admitting: Nephrology

## 2015-07-18 ENCOUNTER — Encounter (HOSPITAL_COMMUNITY): Payer: Self-pay

## 2015-07-18 DIAGNOSIS — E7521 Fabry (-Anderson) disease: Secondary | ICD-10-CM | POA: Diagnosis not present

## 2015-07-18 DIAGNOSIS — E1101 Type 2 diabetes mellitus with hyperosmolarity with coma: Secondary | ICD-10-CM | POA: Insufficient documentation

## 2015-07-18 MED ORDER — SODIUM CHLORIDE 0.9 % IV SOLN
INTRAVENOUS | Status: DC
  Administered 2015-07-18: 09:00:00 via INTRAVENOUS

## 2015-07-18 MED ORDER — SODIUM CHLORIDE 0.9 % IV SOLN
115.0000 mg | INTRAVENOUS | Status: DC
  Administered 2015-07-18: 115 mg via INTRAVENOUS
  Filled 2015-07-18: qty 21

## 2015-07-21 NOTE — Telephone Encounter (Signed)
Date of birth verified by pt  Lab results given Pt verbalized understanding 

## 2015-07-28 ENCOUNTER — Telehealth: Payer: Self-pay | Admitting: Family Medicine

## 2015-07-28 NOTE — Telephone Encounter (Signed)
Pt. Called stating that he went to his [facility] to get his medications and he was told that a code was missing for them to be able to fill his insurance. Please f/u with pt.

## 2015-07-30 NOTE — Telephone Encounter (Signed)
Called [facility] strips ready to be pick up  No code needed at this time  Pt notified

## 2015-08-01 ENCOUNTER — Ambulatory Visit (HOSPITAL_COMMUNITY)
Admission: RE | Admit: 2015-08-01 | Discharge: 2015-08-01 | Disposition: A | Discharge status: Home / Self Care | Payer: Medicare Other | Source: Ambulatory Visit | Attending: Nephrology | Admitting: Nephrology

## 2015-08-01 ENCOUNTER — Other Ambulatory Visit (HOSPITAL_COMMUNITY): Payer: Self-pay | Admitting: Nephrology

## 2015-08-01 DIAGNOSIS — E7521 Fabry (-Anderson) disease: Secondary | ICD-10-CM | POA: Diagnosis not present

## 2015-08-01 LAB — URINALYSIS, ROUTINE W REFLEX MICROSCOPIC
Bilirubin Urine: NEGATIVE
Glucose, UA: NEGATIVE mg/dL
Hgb urine dipstick: NEGATIVE
Ketones, ur: NEGATIVE mg/dL
Leukocytes, UA: NEGATIVE
Nitrite: NEGATIVE
Protein, ur: 30 mg/dL — AB
Specific Gravity, Urine: 1.01 (ref 1.005–1.030)
pH: 6 (ref 5.0–8.0)

## 2015-08-01 LAB — COMPREHENSIVE METABOLIC PANEL
ALT: 25 U/L (ref 17–63)
AST: 34 U/L (ref 15–41)
Albumin: 3.1 g/dL — ABNORMAL LOW (ref 3.5–5.0)
Alkaline Phosphatase: 65 U/L (ref 38–126)
Anion gap: 13 (ref 5–15)
BUN: 23 mg/dL — ABNORMAL HIGH (ref 6–20)
CO2: 20 mmol/L — ABNORMAL LOW (ref 22–32)
Calcium: 9 mg/dL (ref 8.9–10.3)
Chloride: 108 mmol/L (ref 101–111)
Creatinine, Ser: 1.53 mg/dL — ABNORMAL HIGH (ref 0.61–1.24)
GFR calc Af Amer: 58 mL/min — ABNORMAL LOW (ref >60)
GFR calc non Af Amer: 50 mL/min — ABNORMAL LOW (ref >60)
Glucose, Bld: 182 mg/dL — ABNORMAL HIGH (ref 65–99)
Potassium: 4.4 mmol/L (ref 3.5–5.1)
Sodium: 141 mmol/L (ref 135–145)
Total Bilirubin: 0.2 mg/dL — ABNORMAL LOW (ref 0.3–1.2)
Total Protein: 7 g/dL (ref 6.5–8.1)

## 2015-08-01 LAB — URINE MICROSCOPIC-ADD ON: RBC / HPF: NONE SEEN RBC/hpf (ref 0–5)

## 2015-08-01 LAB — PHOSPHORUS: Phosphorus: 4.4 mg/dL (ref 2.5–4.6)

## 2015-08-01 MED ORDER — SODIUM CHLORIDE 0.9 % IV SOLN
INTRAVENOUS | Status: DC
  Administered 2015-08-01: 08:00:00 via INTRAVENOUS

## 2015-08-01 MED ORDER — SODIUM CHLORIDE 0.9 % IV SOLN
115.0000 mg | INTRAVENOUS | Status: DC
  Administered 2015-08-01: 115 mg via INTRAVENOUS
  Filled 2015-08-01: qty 21

## 2015-08-08 NOTE — Discharge Summary (Signed)
Physician Discharge Summary  [name] MRN: [id number] DOB/AGE: [birth year] 55 y.o.  Admit date: 06/23/2015 Discharge date: 06/24/2015  Admission Diagnoses:  Anorectal fistula   Discharge Diagnoses:  Active Problems:   Anorectal fistula   Discharged Condition: good  Hospital Course: The patient was admitted postoperatively primarily because he had not made arrangements for someone to drive him home after surgery which was supposed to be an outpatient procedure. We kept him overnight for observation.  Consults: None  Significant Diagnostic Studies: None  Treatments: IV hydration and analgesia: Vicodin  Discharge Exam: Blood pressure 148/79, pulse 71, temperature 98.5 F (36.9 C), temperature source Oral, resp. rate 20, height 5\' 10"  (1.778 m), weight 119.75 kg (264 lb), SpO2 99 %. General appearance: alert, cooperative, appears stated age and mild distress  Disposition: 01-Home or Self Care  Discharge Instructions    Call MD for:  difficulty breathing, headache or visual disturbances    Complete by:  As directed      Call MD for:  extreme fatigue    Complete by:  As directed      Call MD for:  hives    Complete by:  As directed      Call MD for:  persistant dizziness or light-headedness    Complete by:  As directed      Call MD for:  persistant nausea and vomiting    Complete by:  As directed      Call MD for:  redness, tenderness, or signs of infection (pain, swelling, redness, odor or green/yellow discharge around incision site)    Complete by:  As directed      Call MD for:  severe uncontrolled pain    Complete by:  As directed      Call MD for:  temperature >100.4    Complete by:  As directed      Change dressing (specify)    Complete by:  As directed   Remove outer dressing when the wound gets soiled.  Start Sitz bath twice daily with warm water after firt bowel movement.     Diet - low sodium heart healthy    Complete by:  As directed      Increase activity slowly    Complete by:  As directed             Medication List    STOP taking these medications        allopurinol 300 MG tablet  Commonly known as:  ZYLOPRIM      TAKE these medications        acetaminophen 500 MG tablet  Commonly known as:  TYLENOL  Take 500 mg by mouth every 14 (fourteen) days. Only taking 1 hour before infusion.  AS premed for Fabrazyme     ALBUTEROL SULFATE HFA IN  Inhale 1 puff into the lungs daily as needed (shortness of breath or wheezing). For shortness of breath     aspirin 81 MG tablet  Take 81 mg by mouth daily.     eplerenone 50 MG tablet  Commonly known as:  INSPRA  Take 1 tablet (50 mg total) by mouth daily.     FABRAZYME IV  Inject 115 mg into the vein every 14 (fourteen) days.     fexofenadine 180 MG tablet  Commonly known as:  ALLEGRA  Take 180 mg by mouth every 14 (fourteen) days.     glucose blood test strip  Commonly known as:  ACCU-CHEK AVIVA  Use  as instructed     insulin aspart 100 UNIT/ML injection  Commonly known as:  novoLOG  Inject 0-15 Units into the skin 3 (three) times daily with meals.     insulin aspart protamine- aspart (70-30) 100 UNIT/ML injection  Commonly known as:  NOVOLOG MIX 70/30  Inject 0.2 mLs (20 Units total) into the skin 2 (two) times daily with a meal.     losartan-hydrochlorothiazide 50-12.5 MG tablet  Commonly known as:  HYZAAR  Take 1 tablet by mouth daily.     meclizine 25 MG tablet  Commonly known as:  ANTIVERT  Take 25 mg by mouth every 6 (six) hours as needed for dizziness or nausea.           Follow-up Information    Follow up with Judeth Horn, MD On 07/01/2015.   Specialty:  General Surgery   Why:  For wound re-check, management of [facility] information:   [place] [address] [phone]       Signed: ESTUS RITO 08/08/2015, 5:34 PM

## 2015-08-15 ENCOUNTER — Encounter (HOSPITAL_COMMUNITY): Payer: Self-pay

## 2015-08-15 ENCOUNTER — Other Ambulatory Visit (HOSPITAL_COMMUNITY): Payer: Self-pay | Admitting: Nephrology

## 2015-08-15 ENCOUNTER — Encounter (HOSPITAL_COMMUNITY)
Admission: RE | Admit: 2015-08-15 | Discharge: 2015-08-15 | Disposition: A | Discharge status: Home / Self Care | Payer: Medicare Other | Source: Ambulatory Visit | Attending: Nephrology | Admitting: Nephrology

## 2015-08-15 DIAGNOSIS — E1101 Type 2 diabetes mellitus with hyperosmolarity with coma: Secondary | ICD-10-CM | POA: Diagnosis not present

## 2015-08-15 DIAGNOSIS — E7521 Fabry (-Anderson) disease: Secondary | ICD-10-CM | POA: Diagnosis not present

## 2015-08-15 MED ORDER — SODIUM CHLORIDE 0.9 % IV SOLN
115.0000 mg | INTRAVENOUS | Status: DC
  Administered 2015-08-15: 115 mg via INTRAVENOUS
  Filled 2015-08-15: qty 21

## 2015-08-15 MED ORDER — SODIUM CHLORIDE 0.9 % IV SOLN
INTRAVENOUS | Status: DC
  Administered 2015-08-15: 08:00:00 via INTRAVENOUS

## 2015-08-29 ENCOUNTER — Encounter (HOSPITAL_COMMUNITY): Payer: Self-pay

## 2015-08-29 ENCOUNTER — Encounter (HOSPITAL_COMMUNITY)
Admission: RE | Admit: 2015-08-29 | Discharge: 2015-08-29 | Disposition: A | Discharge status: Home / Self Care | Payer: Medicare Other | Source: Ambulatory Visit | Attending: Nephrology | Admitting: Nephrology

## 2015-08-29 DIAGNOSIS — E1101 Type 2 diabetes mellitus with hyperosmolarity with coma: Secondary | ICD-10-CM | POA: Diagnosis not present

## 2015-08-29 DIAGNOSIS — E7521 Fabry (-Anderson) disease: Secondary | ICD-10-CM | POA: Diagnosis not present

## 2015-08-29 LAB — URINE MICROSCOPIC-ADD ON

## 2015-08-29 LAB — COMPREHENSIVE METABOLIC PANEL
ALT: 27 U/L (ref 17–63)
AST: 26 U/L (ref 15–41)
Albumin: 3.1 g/dL — ABNORMAL LOW (ref 3.5–5.0)
Alkaline Phosphatase: 76 U/L (ref 38–126)
Anion gap: 9 (ref 5–15)
BUN: 17 mg/dL (ref 6–20)
CO2: 26 mmol/L (ref 22–32)
Calcium: 9 mg/dL (ref 8.9–10.3)
Chloride: 109 mmol/L (ref 101–111)
Creatinine, Ser: 1.4 mg/dL — ABNORMAL HIGH (ref 0.61–1.24)
GFR calc Af Amer: >60 mL/min (ref >60)
GFR calc non Af Amer: 56 mL/min — ABNORMAL LOW (ref >60)
Glucose, Bld: 190 mg/dL — ABNORMAL HIGH (ref 65–99)
Potassium: 4.2 mmol/L (ref 3.5–5.1)
Sodium: 144 mmol/L (ref 135–145)
Total Bilirubin: 0.4 mg/dL (ref 0.3–1.2)
Total Protein: 7.2 g/dL (ref 6.5–8.1)

## 2015-08-29 LAB — URINALYSIS, ROUTINE W REFLEX MICROSCOPIC
Bilirubin Urine: NEGATIVE
Glucose, UA: NEGATIVE mg/dL
Hgb urine dipstick: NEGATIVE
Ketones, ur: NEGATIVE mg/dL
Leukocytes, UA: NEGATIVE
Nitrite: NEGATIVE
Protein, ur: 100 mg/dL — AB
Specific Gravity, Urine: 1.012 (ref 1.005–1.030)
pH: 5.5 (ref 5.0–8.0)

## 2015-08-29 LAB — PHOSPHORUS: Phosphorus: 3.5 mg/dL (ref 2.5–4.6)

## 2015-08-29 MED ORDER — SODIUM CHLORIDE 0.9 % IV SOLN
115.0000 mg | INTRAVENOUS | Status: DC
  Administered 2015-08-29: 115 mg via INTRAVENOUS
  Filled 2015-08-29: qty 21

## 2015-08-29 MED ORDER — SODIUM CHLORIDE 0.9 % IV SOLN
INTRAVENOUS | Status: DC
  Administered 2015-08-29: 09:00:00 via INTRAVENOUS

## 2015-08-29 NOTE — Discharge Instructions (Signed)
Agalsidase Beta injection °What is this medicine? °AGALSIDASE BETA is used to replace an enzyme that is missing in patients with Fabry disease. It is not a cure. °This medicine may be used for other purposes; ask your health care provider or pharmacist if you have questions. °What should I tell my health care provider before I take this medicine? °They need to know if you have any of these conditions: °-heart disease °-an unusual or allergic reaction to agalsidase beta, mannitol, other medicines, foods, dyes, or preservatives °-pregnant or trying to get pregnant °-breast-feeding °How should I use this medicine? °This medicine is for infusion into a vein. It is given by a health care professional in a hospital or clinic setting. °Talk to your pediatrician regarding the use of this medicine in children. Special care may be needed. °Overdosage: If you think you have taken too much of this medicine contact a poison control center or emergency room at once. °NOTE: This medicine is only for you. Do not share this medicine with others. °What if I miss a dose? °It is important not to miss your dose. Call your doctor or health care professional if you are unable to keep an appointment. °What may interact with this medicine? °-amiodarone °-chloroquine °-gentamicin °-hydroxychloroquine °-monobenzone °This list may not describe all possible interactions. Give your health care provider a list of all the medicines, herbs, non-prescription drugs, or dietary supplements you use. Also tell them if you smoke, drink alcohol, or use illegal drugs. Some items may interact with your medicine. °What should I watch for while using this medicine? °Visit your doctor or health care professional for regular checks on your progress. Tell your doctor or healthcare professional if your symptoms do not start to get better or if they get worse. °There is a registry for patients with Fabry disease. The registry is used to gather information about  the disease and its effects. Talk to your health care provider if you would like to join the registry. °What side effects may I notice from receiving this medicine? °Side effects that you should report to your doctor or health care professional as soon as possible: °-allergic reactions like skin rash, itching or hives, swelling of the face, lips, or tongue °-breathing problems °-chest pain, tightness °-depression °-dizziness °-fast, irregular heart beat °-swelling of the arms or legs °Side effects that usually do not require medical attention (report to your doctor or health care professional if they continue or are bothersome): °-aches or pains °-anxiety °-fever or chills at the time of injection °-headache °-nausea, vomiting °-stomach pain, upset °This list may not describe all possible side effects. Call your doctor for medical advice about side effects. You may report side effects to FDA at 1-800-FDA-1088. °Where should I keep my medicine? °This drug is given in a hospital or clinic and will not be stored at home. °NOTE: This sheet is a summary. It may not cover all possible information. If you have questions about this medicine, talk to your doctor, pharmacist, or health care provider. °  °© 2016, Elsevier/Gold Standard. (2006-01-31 12:25:00) ° °

## 2015-09-05 ENCOUNTER — Encounter (HOSPITAL_COMMUNITY): Payer: Medicare Other

## 2015-09-11 ENCOUNTER — Other Ambulatory Visit: Payer: Self-pay | Admitting: *Deleted

## 2015-09-11 MED ORDER — ACCU-CHEK FASTCLIX LANCETS MISC: 1.0000 | Freq: Three times a day (TID) | Status: DC

## 2015-09-12 ENCOUNTER — Encounter (HOSPITAL_COMMUNITY): Payer: Self-pay

## 2015-09-12 ENCOUNTER — Encounter (HOSPITAL_COMMUNITY)
Admission: RE | Admit: 2015-09-12 | Discharge: 2015-09-12 | Disposition: A | Discharge status: Home / Self Care | Payer: Medicare Other | Source: Ambulatory Visit | Attending: Nephrology | Admitting: Nephrology

## 2015-09-12 DIAGNOSIS — E7521 Fabry (-Anderson) disease: Secondary | ICD-10-CM | POA: Insufficient documentation

## 2015-09-12 DIAGNOSIS — E1101 Type 2 diabetes mellitus with hyperosmolarity with coma: Secondary | ICD-10-CM | POA: Insufficient documentation

## 2015-09-12 MED ORDER — SODIUM CHLORIDE 0.9 % IV SOLN
INTRAVENOUS | Status: DC
  Administered 2015-09-12: 11:00:00 via INTRAVENOUS

## 2015-09-12 MED ORDER — SODIUM CHLORIDE 0.9 % IV SOLN
115.0000 mg | INTRAVENOUS | Status: DC
  Administered 2015-09-12: 115 mg via INTRAVENOUS
  Filled 2015-09-12: qty 2

## 2015-09-18 ENCOUNTER — Encounter: Payer: Self-pay | Admitting: Family Medicine

## 2015-09-18 ENCOUNTER — Ambulatory Visit: Payer: Medicare Other | Attending: Family Medicine | Admitting: Family Medicine

## 2015-09-18 ENCOUNTER — Encounter: Payer: Self-pay | Admitting: Clinical

## 2015-09-18 VITALS — BP 122/84 | HR 73 | Temp 98.6°F | Resp 16 | Ht 70.0 in | Wt 275.0 lb

## 2015-09-18 DIAGNOSIS — E118 Type 2 diabetes mellitus with unspecified complications: Secondary | ICD-10-CM | POA: Diagnosis not present

## 2015-09-18 DIAGNOSIS — J449 Chronic obstructive pulmonary disease, unspecified: Secondary | ICD-10-CM | POA: Insufficient documentation

## 2015-09-18 DIAGNOSIS — E1165 Type 2 diabetes mellitus with hyperglycemia: Secondary | ICD-10-CM

## 2015-09-18 DIAGNOSIS — Z7982 Long term (current) use of aspirin: Secondary | ICD-10-CM | POA: Insufficient documentation

## 2015-09-18 DIAGNOSIS — E7521 Fabry (-Anderson) disease: Secondary | ICD-10-CM | POA: Diagnosis not present

## 2015-09-18 DIAGNOSIS — IMO0002 Reserved for concepts with insufficient information to code with codable children: Secondary | ICD-10-CM

## 2015-09-18 DIAGNOSIS — K6289 Other specified diseases of anus and rectum: Secondary | ICD-10-CM | POA: Diagnosis not present

## 2015-09-18 DIAGNOSIS — E119 Type 2 diabetes mellitus without complications: Secondary | ICD-10-CM | POA: Diagnosis present

## 2015-09-18 DIAGNOSIS — Z794 Long term (current) use of insulin: Secondary | ICD-10-CM

## 2015-09-18 DIAGNOSIS — K605 Anorectal fistula: Secondary | ICD-10-CM

## 2015-09-18 DIAGNOSIS — Z79899 Other long term (current) drug therapy: Secondary | ICD-10-CM | POA: Diagnosis not present

## 2015-09-18 LAB — POCT GLYCOSYLATED HEMOGLOBIN (HGB A1C): Hemoglobin A1C: 8

## 2015-09-18 LAB — GLUCOSE, POCT (MANUAL RESULT ENTRY): POC Glucose: 134 mg/dl — AB (ref 70–99)

## 2015-09-18 NOTE — Patient Instructions (Addendum)
[name] was seen today for post-op problem.  Diagnoses and all orders for this visit:  Uncontrolled type 2 diabetes mellitus with complication, with long-term current use of insulin (HCC) -     POCT glycosylated hemoglobin (Hb A1C) -     POCT glucose (manual entry)   Improved diabetes. Well done.  Peri-rectal fistula is healing post-surgery.  There is still some drainage where the skin has not healed completely. You have delay in healing from diabetes and Fabry's so it it will take a little more time. Avoid straining with bowel movements.   F/u in 3 months for diabetes  Dr. Adrian Blackwater

## 2015-09-18 NOTE — Assessment & Plan Note (Signed)
Improved with declining A1c  Plan to continue current regimen

## 2015-09-18 NOTE — Assessment & Plan Note (Signed)
Fistula s/p fistulotomy. 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   P: Reassurance Avoid constipation and straining

## 2015-09-18 NOTE — Progress Notes (Signed)
F/U perirectal cyst  No pain today  No tobacco user  No suicidal thoughts in the past two weeks

## 2015-09-18 NOTE — Progress Notes (Signed)
Depression screen [facility] 2/9 09/18/2015 09/18/2015 09/18/2015 04/21/2015 11/20/2014  Decreased Interest 0 0 0 0 0  Down, Depressed, Hopeless 0 0 0 0 0  PHQ - 2 Score 0 0 0 0 0  Altered sleeping 1 1 - - -  Tired, decreased energy 1 1 - - -  Change in appetite 0 0 - - -  Feeling bad or failure about yourself  0 0 - - -  Trouble concentrating 0 0 - - -  Moving slowly or fidgety/restless 0 0 - - -  Suicidal thoughts 0 0 - - -  PHQ-9 Score 2 2 - - -    GAD 7 : Generalized Anxiety Score 09/18/2015 09/18/2015  Nervous, Anxious, on Edge 0 0  Control/stop worrying 0 0  Worry too much - different things 1 1  Trouble relaxing 0 0  Restless 0 0  Easily annoyed or irritable 0 0  Afraid - awful might happen 0 0  Total GAD 7 Score 1 1

## 2015-09-18 NOTE — Progress Notes (Signed)
Subjective:  [name], male    DOB: [birth year]  Age: 55 y.o. 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  Perirectal pain: he had chronic perirectal fistula s/p fistulotomy. He has followed up with his surgeon who removed the settons.  He denies rectal pain and itching. He admits to ongoing perirectal drainage of clear/yellow tinged fluid.    2. CHRONIC DIABETES  Disease Monitoring  Blood Sugar Ranges: 95-397  Polyuria: no   Visual problems: no   Medication Compliance: yes  Medication Side Effects  Hypoglycemia: no   Social History  Substance Use Topics  . Smoking status: Never Smoker   . Smokeless tobacco: Never Used  . Alcohol Use: Yes     Comment: 07/23/2014 "might have a few drinks on holidays or at cookouts"   Past Surgical History  Procedure Laterality Date  . Cardiac catheterization  03-12-2011  DR Daneen Schick    HYPERTROPHIC CARDIOMYOPATHY WITH LV CAVITY  APPEARANCE CONSISTANT WITH SIGNIFICANT APICAL HYPERTROPHY/ NORMAL LVSF / EF 55%/ EVIDENCE OF DIASTOLIC DYSFUNCTION WITH EDP OF 23-50mmHg AFTER A-WAVE/ NORMAL CORONARY ARTERIES  . Umbilical hernia repair  03/22/2012    Procedure: HERNIA REPAIR UMBILICAL ADULT;  Surgeon: Leighton Ruff, MD;  Location: [facility];  Service: General;  Laterality: N/A;  Umbilical Hernia Repair   . Hernia repair  0000000    Umbilical Hernia Repair  . Irrigation and debridement abscess N/A 07/27/2013    Procedure: IRRIGATION AND DEBRIDEMENT OF SKIN, SOFT TISSUE AND MUSCLES OF UPPER BACK (11X19X4cm) WITH 10 BLADE AND PULSATILE LAVAGE ;  Surgeon: Gayland Curry, MD;  Location: [place];  Service: General;  Laterality: N/A;  . Insert / replace / remove pacemaker  02/12/2011    SJM implanted by Dr Rayann Heman for Mobitz II second degree AV block and syncope  . Incision and drainage perirectal abscess Left 07/29/2014    Procedure: IRRIGATION AND DEBRIDEMENT PERIRECTAL  ABSCESS;  Surgeon: Doreen Salvage, MD;  Location: [place];  Service: General;  Laterality: Left;  Prone position  . Evaluation under anesthesia with fistulectomy N/A 06/23/2015    Procedure: EXAM UNDER ANESTHESIA WITH POSSIBLE FISTULOTOMY;  Surgeon: Judeth Horn, MD;  Location: [place];  Service: General;  Laterality: N/A;   Outpatient Prescriptions Prior to Visit  Medication Sig Dispense Refill  . ACCU-CHEK FASTCLIX LANCETS MISC 1 each by Other route 3 (three) times daily. E 11.59 - insulin dependent, needs testing for 3-4 times daily. 102 each 11  . acetaminophen (TYLENOL) 500 MG tablet Take 500 mg by mouth every 14 (fourteen) days. Only taking 1 hour before infusion.  AS premed for Fabrazyme    . Agalsidase beta (FABRAZYME IV) Inject 115 mg into the vein every 14 (fourteen) days.     . ALBUTEROL SULFATE HFA IN Inhale 1 puff into the lungs daily as needed (shortness of breath or wheezing). For shortness of breath    . allopurinol (ZYLOPRIM) 300 MG tablet Take 1 tablet (300 mg total) by mouth daily. 30 tablet 5  . aspirin 81 MG tablet Take 81 mg by mouth daily.     [name] Kitchen eplerenone (INSPRA) 50 MG tablet Take 1 tablet (50 mg total) by mouth daily. 30 tablet 5  . fexofenadine (ALLEGRA) 180 MG tablet Take 180 mg by mouth every 14 (fourteen) days.     [name] Kitchen glucose blood (ACCU-CHEK AVIVA) test strip Use as instructed 100 each  12  . insulin aspart (NOVOLOG) 100 UNIT/ML injection Inject 0-15 Units into the skin 3 (three) times daily with meals. (Patient taking differently: Inject 0-15 Units into the skin 2 (two) times daily. ) 10 mL 11  . insulin aspart protamine- aspart (NOVOLOG MIX 70/30) (70-30) 100 UNIT/ML injection Inject 0.2 mLs (20 Units total) into the skin 2 (two) times daily with a meal. (Patient taking differently: Inject 20 Units into the skin 2 (two) times daily with a meal. ) 10 mL 12  . losartan-hydrochlorothiazide (HYZAAR) 50-12.5 MG tablet Take 1 tablet by mouth daily.  6  . meclizine (ANTIVERT) 25 MG  tablet Take 25 mg by mouth every 6 (six) hours as needed for dizziness or nausea.   1  . lidocaine (XYLOCAINE) 5 % ointment Apply 1 application topically daily as needed. (Patient not taking: Reported on 09/18/2015) 35.44 g 0   Facility-Administered Medications Prior to Visit  Medication Dose Route Frequency Provider Last Rate Last Dose  . 0.9 %  sodium chloride infusion   Intravenous Q14 Days Donato Heinz, MD   Stopped at 09/12/15 1048  . agalsidase beta (FABRAZYME) 115 mg in sodium chloride 0.9 % 500 mL IVPB  115 mg Intravenous Q14 Days Donato Heinz, MD   115 mg at 09/12/15 1048    ROS Review of Systems  Constitutional: Negative for fever, chills, fatigue and unexpected weight change.  Eyes: Negative for visual disturbance.  Respiratory: Negative for cough and shortness of breath.   Cardiovascular: Positive for leg swelling. Negative for chest pain and palpitations.  Gastrointestinal: Negative for nausea, vomiting, abdominal pain, diarrhea, constipation, blood in stool and rectal pain.       Perirectal discharge   Endocrine: Negative for polydipsia, polyphagia and polyuria.  Musculoskeletal: Negative for myalgias, back pain, arthralgias, gait problem and neck pain.  Skin: Negative for rash.  Allergic/Immunologic: Negative for immunocompromised state.  Hematological: Negative for adenopathy. Does not bruise/bleed easily.  Psychiatric/Behavioral: Negative for suicidal ideas, sleep disturbance and dysphoric mood. The patient is not nervous/anxious.     Objective:  BP 122/84 mmHg  Pulse 73  Temp(Src) 98.6 F (37 C) (Oral)  Resp 16  Ht 5\' 10"  (1.778 m)  Wt 275 lb (124.739 kg)  BMI 39.46 kg/m2  SpO2 97%  BP/Weight 09/18/2015 09/12/2015 123456  Systolic BP 123XX123 [id number] 123456  Diastolic BP 84 99 86  Wt. (Lbs) 275 277 272.6  BMI 39.46 39.75 39.11   Physical Exam  Constitutional: He appears well-developed and well-nourished. No distress.  HENT:  Head: Normocephalic and  atraumatic.  Neck: Normal range of motion. Neck supple.  Cardiovascular: Normal rate, regular rhythm, normal heart sounds and intact distal pulses.   Pulmonary/Chest: Effort normal.  Genitourinary:     Musculoskeletal: He exhibits no edema.  Neurological: He is alert.  Skin: Skin is warm and dry. No rash noted. No erythema.  Psychiatric: He has a normal mood and affect.    Lab Results  Component Value Date   HGBA1C 8.0 09/18/2015   CBG 134 Assessment & Plan:   Haston was seen today for post-op problem.  Diagnoses and all orders for this visit:  Uncontrolled type 2 diabetes mellitus with complication, with long-term current use of insulin (HCC) -     POCT glycosylated hemoglobin (Hb A1C) -     POCT glucose (manual entry)   Follow-up: No Follow-up on file.   Boykin Nearing MD

## 2015-09-26 ENCOUNTER — Encounter (HOSPITAL_COMMUNITY)
Admission: RE | Admit: 2015-09-26 | Discharge: 2015-09-26 | Disposition: A | Discharge status: Home / Self Care | Payer: Medicare Other | Source: Ambulatory Visit | Attending: Nephrology | Admitting: Nephrology

## 2015-09-26 ENCOUNTER — Encounter (HOSPITAL_COMMUNITY): Payer: Self-pay

## 2015-09-26 DIAGNOSIS — E1101 Type 2 diabetes mellitus with hyperosmolarity with coma: Secondary | ICD-10-CM | POA: Diagnosis not present

## 2015-09-26 DIAGNOSIS — E7521 Fabry (-Anderson) disease: Secondary | ICD-10-CM | POA: Diagnosis not present

## 2015-09-26 LAB — COMPREHENSIVE METABOLIC PANEL
ALT: 21 U/L (ref 17–63)
AST: 25 U/L (ref 15–41)
Albumin: 3.2 g/dL — ABNORMAL LOW (ref 3.5–5.0)
Alkaline Phosphatase: 59 U/L (ref 38–126)
Anion gap: 7 (ref 5–15)
BUN: 16 mg/dL (ref 6–20)
CO2: 25 mmol/L (ref 22–32)
Calcium: 8.7 mg/dL — ABNORMAL LOW (ref 8.9–10.3)
Chloride: 111 mmol/L (ref 101–111)
Creatinine, Ser: 1.3 mg/dL — ABNORMAL HIGH (ref 0.61–1.24)
GFR calc Af Amer: >60 mL/min (ref >60)
GFR calc non Af Amer: >60 mL/min (ref >60)
Glucose, Bld: 139 mg/dL — ABNORMAL HIGH (ref 65–99)
Potassium: 4.1 mmol/L (ref 3.5–5.1)
Sodium: 143 mmol/L (ref 135–145)
Total Bilirubin: 0.8 mg/dL (ref 0.3–1.2)
Total Protein: 7 g/dL (ref 6.5–8.1)

## 2015-09-26 LAB — URINALYSIS W MICROSCOPIC (NOT AT ARMC)
Bacteria, UA: NONE SEEN
Bilirubin Urine: NEGATIVE
Glucose, UA: NEGATIVE mg/dL
Hgb urine dipstick: NEGATIVE
Ketones, ur: NEGATIVE mg/dL
Leukocytes, UA: NEGATIVE
Nitrite: NEGATIVE
Protein, ur: 100 mg/dL — AB
RBC / HPF: NONE SEEN RBC/hpf (ref 0–5)
Specific Gravity, Urine: 1.012 (ref 1.005–1.030)
Squamous Epithelial / LPF: NONE SEEN
WBC, UA: NONE SEEN WBC/hpf (ref 0–5)
pH: 5.5 (ref 5.0–8.0)

## 2015-09-26 LAB — CBC WITH DIFFERENTIAL/PLATELET
Basophils Absolute: 0 10*3/uL (ref 0.0–0.1)
Basophils Relative: 0 %
Eosinophils Absolute: 0.3 10*3/uL (ref 0.0–0.7)
Eosinophils Relative: 4 %
HCT: 44.8 % (ref 39.0–52.0)
Hemoglobin: 15 g/dL (ref 13.0–17.0)
Lymphocytes Relative: 35 %
Lymphs Abs: 2.4 10*3/uL (ref 0.7–4.0)
MCH: 29.8 pg (ref 26.0–34.0)
MCHC: 33.5 g/dL (ref 30.0–36.0)
MCV: 89.1 fL (ref 78.0–100.0)
Monocytes Absolute: 0.7 10*3/uL (ref 0.1–1.0)
Monocytes Relative: 11 %
Neutro Abs: 3.4 10*3/uL (ref 1.7–7.7)
Neutrophils Relative %: 50 %
Platelets: 194 10*3/uL (ref 150–400)
RBC: 5.03 MIL/uL (ref 4.22–5.81)
RDW: 12.8 % (ref 11.5–15.5)
WBC: 6.8 10*3/uL (ref 4.0–10.5)

## 2015-09-26 LAB — LIPID PANEL
Cholesterol: 238 mg/dL — ABNORMAL HIGH (ref 0–200)
HDL: 55 mg/dL (ref >40)
LDL Cholesterol: 163 mg/dL — ABNORMAL HIGH (ref 0–99)
Total CHOL/HDL Ratio: 4.3 RATIO
Triglycerides: 100 mg/dL (ref <150)
VLDL: 20 mg/dL (ref 0–40)

## 2015-09-26 LAB — IRON AND TIBC
Iron: 89 ug/dL (ref 45–182)
Saturation Ratios: 30 % (ref 17.9–39.5)
TIBC: 300 ug/dL (ref 250–450)
UIBC: 211 ug/dL

## 2015-09-26 LAB — PHOSPHORUS: Phosphorus: 3.4 mg/dL (ref 2.5–4.6)

## 2015-09-26 LAB — FERRITIN: Ferritin: 87 ng/mL (ref 24–336)

## 2015-09-26 MED ORDER — SODIUM CHLORIDE 0.9 % IV SOLN
115.0000 mg | INTRAVENOUS | Status: DC
  Administered 2015-09-26: 115 mg via INTRAVENOUS
  Filled 2015-09-26: qty 21

## 2015-09-26 MED ORDER — SODIUM CHLORIDE 0.9 % IV SOLN
INTRAVENOUS | Status: DC
  Administered 2015-09-26: 09:00:00 via INTRAVENOUS

## 2015-09-29 ENCOUNTER — Telehealth: Payer: Self-pay | Admitting: Family Medicine

## 2015-09-29 NOTE — Telephone Encounter (Signed)
Pt. Called stating that he went to [facility] to pick up his Novolog and he was told that his [facility] insurance will no longer pay for the medication. Pt. Would like a new Rx sent to [facility] that will be covered by his insurance. Please f/u with pt.

## 2015-10-06 ENCOUNTER — Other Ambulatory Visit: Payer: Self-pay | Admitting: Family Medicine

## 2015-10-06 DIAGNOSIS — E1165 Type 2 diabetes mellitus with hyperglycemia: Secondary | ICD-10-CM

## 2015-10-06 DIAGNOSIS — Z794 Long term (current) use of insulin: Principal | ICD-10-CM

## 2015-10-06 DIAGNOSIS — E118 Type 2 diabetes mellitus with unspecified complications: Principal | ICD-10-CM

## 2015-10-06 DIAGNOSIS — IMO0002 Reserved for concepts with insufficient information to code with codable children: Secondary | ICD-10-CM

## 2015-10-06 MED ORDER — INSULIN PEN NEEDLE 31G X 8 MM MISC: 1.0000 | Freq: Two times a day (BID) | Status: DC

## 2015-10-06 MED ORDER — INSULIN LISPRO PROT & LISPRO (75-25 MIX) 100 UNIT/ML KWIKPEN: 20.0000 [IU] | PEN_INJECTOR | Freq: Two times a day (BID) | SUBCUTANEOUS | Status: DC

## 2015-10-06 NOTE — Telephone Encounter (Signed)
Received notification from patient's [facility] that insurance will no longer pay for novolog 70/30 Patient has [employer] medicare  Sent in humalog 75/25

## 2015-10-09 DIAGNOSIS — I890 Lymphedema, not elsewhere classified: Secondary | ICD-10-CM | POA: Diagnosis not present

## 2015-10-09 DIAGNOSIS — I441 Atrioventricular block, second degree: Secondary | ICD-10-CM | POA: Diagnosis not present

## 2015-10-09 DIAGNOSIS — N182 Chronic kidney disease, stage 2 (mild): Secondary | ICD-10-CM | POA: Diagnosis not present

## 2015-10-09 DIAGNOSIS — E118 Type 2 diabetes mellitus with unspecified complications: Secondary | ICD-10-CM | POA: Diagnosis not present

## 2015-10-09 DIAGNOSIS — I129 Hypertensive chronic kidney disease with stage 1 through stage 4 chronic kidney disease, or unspecified chronic kidney disease: Secondary | ICD-10-CM | POA: Diagnosis not present

## 2015-10-09 DIAGNOSIS — M109 Gout, unspecified: Secondary | ICD-10-CM | POA: Diagnosis not present

## 2015-10-09 DIAGNOSIS — E7521 Fabry (-Anderson) disease: Secondary | ICD-10-CM | POA: Diagnosis not present

## 2015-10-09 DIAGNOSIS — S21209A Unspecified open wound of unspecified back wall of thorax without penetration into thoracic cavity, initial encounter: Secondary | ICD-10-CM | POA: Diagnosis not present

## 2015-10-10 ENCOUNTER — Encounter (HOSPITAL_COMMUNITY)
Admission: RE | Admit: 2015-10-10 | Discharge: 2015-10-10 | Disposition: A | Discharge status: Home / Self Care | Payer: Medicare Other | Source: Ambulatory Visit | Attending: Nephrology | Admitting: Nephrology

## 2015-10-10 ENCOUNTER — Encounter (HOSPITAL_COMMUNITY): Payer: Self-pay

## 2015-10-10 DIAGNOSIS — E1101 Type 2 diabetes mellitus with hyperosmolarity with coma: Secondary | ICD-10-CM | POA: Insufficient documentation

## 2015-10-10 DIAGNOSIS — E7521 Fabry (-Anderson) disease: Secondary | ICD-10-CM | POA: Diagnosis not present

## 2015-10-10 MED ORDER — SODIUM CHLORIDE 0.9 % IV SOLN
INTRAVENOUS | Status: DC
  Administered 2015-10-10: 09:00:00 via INTRAVENOUS

## 2015-10-10 MED ORDER — SODIUM CHLORIDE 0.9 % IV SOLN
115.0000 mg | INTRAVENOUS | Status: DC
  Administered 2015-10-10: 115 mg via INTRAVENOUS
  Filled 2015-10-10: qty 21

## 2015-10-10 NOTE — Discharge Instructions (Signed)
Fabrazyme Agalsidase Beta injection What is this medicine? AGALSIDASE BETA is used to replace an enzyme that is missing in patients with Fabry disease. It is not a cure. This medicine may be used for other purposes; ask your health care provider or pharmacist if you have questions. What should I tell my health care provider before I take this medicine? They need to know if you have any of these conditions: -heart disease -an unusual or allergic reaction to agalsidase beta, mannitol, other medicines, foods, dyes, or preservatives -pregnant or trying to get pregnant -breast-feeding How should I use this medicine? This medicine is for infusion into a vein. It is given by a health care professional in a hospital or clinic setting. Talk to your pediatrician regarding the use of this medicine in children. Special care may be needed. Overdosage: If you think you have taken too much of this medicine contact a poison control center or emergency room at once. NOTE: This medicine is only for you. Do not share this medicine with others. What if I miss a dose? It is important not to miss your dose. Call your doctor or health care professional if you are unable to keep an appointment. What may interact with this medicine? -amiodarone -chloroquine -gentamicin -hydroxychloroquine -monobenzone This list may not describe all possible interactions. Give your health care provider a list of all the medicines, herbs, non-prescription drugs, or dietary supplements you use. Also tell them if you smoke, drink alcohol, or use illegal drugs. Some items may interact with your medicine. What should I watch for while using this medicine? Visit your doctor or health care professional for regular checks on your progress. Tell your doctor or healthcare professional if your symptoms do not start to get better or if they get worse. There is a registry for patients with Fabry disease. The registry is used to gather  information about the disease and its effects. Talk to your health care provider if you would like to join the registry. What side effects may I notice from receiving this medicine? Side effects that you should report to your doctor or health care professional as soon as possible: -allergic reactions like skin rash, itching or hives, swelling of the face, lips, or tongue -breathing problems -chest pain, tightness -depression -dizziness -fast, irregular heart beat -swelling of the arms or legs Side effects that usually do not require medical attention (report to your doctor or health care professional if they continue or are bothersome): -aches or pains -anxiety -fever or chills at the time of injection -headache -nausea, vomiting -stomach pain, upset This list may not describe all possible side effects. Call your doctor for medical advice about side effects. You may report side effects to FDA at 1-800-FDA-1088. Where should I keep my medicine? This drug is given in a hospital or clinic and will not be stored at home. NOTE: This sheet is a summary. It may not cover all possible information. If you have questions about this medicine, talk to your doctor, pharmacist, or health care provider.    2016, Elsevier/Gold Standard. (2006-01-31 12:25:00)

## 2015-10-24 ENCOUNTER — Encounter (HOSPITAL_COMMUNITY)
Admission: RE | Admit: 2015-10-24 | Discharge: 2015-10-24 | Disposition: A | Discharge status: Home / Self Care | Payer: Medicare Other | Source: Ambulatory Visit | Attending: Nephrology | Admitting: Nephrology

## 2015-10-24 ENCOUNTER — Encounter (HOSPITAL_COMMUNITY): Payer: Self-pay

## 2015-10-24 DIAGNOSIS — E7521 Fabry (-Anderson) disease: Secondary | ICD-10-CM | POA: Diagnosis not present

## 2015-10-24 DIAGNOSIS — E1101 Type 2 diabetes mellitus with hyperosmolarity with coma: Secondary | ICD-10-CM | POA: Diagnosis not present

## 2015-10-24 LAB — URINE MICROSCOPIC-ADD ON: RBC / HPF: NONE SEEN RBC/hpf (ref 0–5)

## 2015-10-24 LAB — COMPREHENSIVE METABOLIC PANEL
ALT: 24 U/L (ref 17–63)
AST: 32 U/L (ref 15–41)
Albumin: 3.2 g/dL — ABNORMAL LOW (ref 3.5–5.0)
Alkaline Phosphatase: 80 U/L (ref 38–126)
Anion gap: 9 (ref 5–15)
BUN: 28 mg/dL — ABNORMAL HIGH (ref 6–20)
CO2: 23 mmol/L (ref 22–32)
Calcium: 8.9 mg/dL (ref 8.9–10.3)
Chloride: 105 mmol/L (ref 101–111)
Creatinine, Ser: 1.82 mg/dL — ABNORMAL HIGH (ref 0.61–1.24)
GFR calc Af Amer: 47 mL/min — ABNORMAL LOW (ref >60)
GFR calc non Af Amer: 40 mL/min — ABNORMAL LOW (ref >60)
Glucose, Bld: 362 mg/dL — ABNORMAL HIGH (ref 65–99)
Potassium: 4.2 mmol/L (ref 3.5–5.1)
Sodium: 137 mmol/L (ref 135–145)
Total Bilirubin: 0.6 mg/dL (ref 0.3–1.2)
Total Protein: 7.3 g/dL (ref 6.5–8.1)

## 2015-10-24 LAB — URINALYSIS, ROUTINE W REFLEX MICROSCOPIC
Bilirubin Urine: NEGATIVE
Glucose, UA: 500 mg/dL — AB
Hgb urine dipstick: NEGATIVE
Ketones, ur: NEGATIVE mg/dL
Leukocytes, UA: NEGATIVE
Nitrite: NEGATIVE
Protein, ur: 100 mg/dL — AB
Specific Gravity, Urine: 1.012 (ref 1.005–1.030)
pH: 5.5 (ref 5.0–8.0)

## 2015-10-24 LAB — PHOSPHORUS: Phosphorus: 4.3 mg/dL (ref 2.5–4.6)

## 2015-10-24 MED ORDER — SODIUM CHLORIDE 0.9 % IV SOLN
115.0000 mg | INTRAVENOUS | Status: DC
  Administered 2015-10-24: 115 mg via INTRAVENOUS
  Filled 2015-10-24: qty 21

## 2015-10-24 MED ORDER — SODIUM CHLORIDE 0.9 % IV SOLN
INTRAVENOUS | Status: DC
  Administered 2015-10-24: 09:00:00 via INTRAVENOUS

## 2015-10-24 NOTE — Progress Notes (Signed)
Diabetes educator Suanne Marker)  in to see patient prior to d/c home. Patient verbalized understanding.

## 2015-10-24 NOTE — Progress Notes (Signed)
Patient here for Fabrazyme infusion. Monthly labs drawn per order. BUN and Creatinine had increased since last month's lab work. Called Dr. Elissa Hefty office and spoke to [place] and informed her of labs. She was able to pull it up in EPIC and will inform MD. Patient stated he has had a hard time controlling blood sugar and was going to call medical doctor who manages. Will inform patient glucose in labs came back at 362.

## 2015-10-24 NOTE — Progress Notes (Signed)
Call placed to in house diabetes coordinator as patient stated he has been having a hard time controlling his sugar. Currently 362 via lab work. Patient is currently in short stay receiving his Fabrazyme infusion. Spoke to [place], diabetes coordinator, who will come to see patient.

## 2015-10-24 NOTE — Progress Notes (Signed)
In addition to monthly lab work ordered and done today u/a sent (per order).

## 2015-11-07 ENCOUNTER — Encounter (HOSPITAL_COMMUNITY): Payer: Self-pay

## 2015-11-07 ENCOUNTER — Encounter (HOSPITAL_COMMUNITY)
Admission: RE | Admit: 2015-11-07 | Discharge: 2015-11-07 | Disposition: A | Discharge status: Home / Self Care | Payer: Medicare Other | Source: Ambulatory Visit | Attending: Nephrology | Admitting: Nephrology

## 2015-11-07 DIAGNOSIS — E7521 Fabry (-Anderson) disease: Secondary | ICD-10-CM | POA: Insufficient documentation

## 2015-11-07 DIAGNOSIS — E1101 Type 2 diabetes mellitus with hyperosmolarity with coma: Secondary | ICD-10-CM | POA: Diagnosis not present

## 2015-11-07 MED ORDER — SODIUM CHLORIDE 0.9 % IV SOLN
INTRAVENOUS | Status: AC
  Administered 2015-11-07: 09:00:00 via INTRAVENOUS

## 2015-11-07 MED ORDER — SODIUM CHLORIDE 0.9 % IV SOLN
115.0000 mg | INTRAVENOUS | Status: AC
  Administered 2015-11-07: 115 mg via INTRAVENOUS
  Filled 2015-11-07: qty 2

## 2015-11-11 ENCOUNTER — Telehealth: Payer: Self-pay | Admitting: Family Medicine

## 2015-11-11 DIAGNOSIS — I10 Essential (primary) hypertension: Secondary | ICD-10-CM

## 2015-11-11 MED ORDER — EPLERENONE 50 MG PO TABS: 50.0000 mg | ORAL_TABLET | Freq: Every day | ORAL | Status: DC

## 2015-11-11 NOTE — Telephone Encounter (Signed)
Pt. Called requesting a refill on the following medication:  eplerenone (INSPRA) 50 MG tablet   Please f/u with pt.

## 2015-11-11 NOTE — Telephone Encounter (Signed)
Eplerenone was refilled.

## 2015-11-19 DIAGNOSIS — N182 Chronic kidney disease, stage 2 (mild): Secondary | ICD-10-CM | POA: Diagnosis not present

## 2015-11-21 ENCOUNTER — Encounter (HOSPITAL_COMMUNITY): Payer: Self-pay

## 2015-11-21 ENCOUNTER — Encounter (HOSPITAL_COMMUNITY)
Admission: RE | Admit: 2015-11-21 | Discharge: 2015-11-21 | Disposition: A | Discharge status: Home / Self Care | Payer: Medicare Other | Source: Ambulatory Visit | Attending: Nephrology | Admitting: Nephrology

## 2015-11-21 DIAGNOSIS — E1101 Type 2 diabetes mellitus with hyperosmolarity with coma: Secondary | ICD-10-CM | POA: Diagnosis not present

## 2015-11-21 DIAGNOSIS — E7521 Fabry (-Anderson) disease: Secondary | ICD-10-CM | POA: Diagnosis not present

## 2015-11-21 LAB — URINE MICROSCOPIC-ADD ON

## 2015-11-21 LAB — COMPREHENSIVE METABOLIC PANEL
ALT: 25 U/L (ref 17–63)
AST: 30 U/L (ref 15–41)
Albumin: 3.1 g/dL — ABNORMAL LOW (ref 3.5–5.0)
Alkaline Phosphatase: 67 U/L (ref 38–126)
Anion gap: 7 (ref 5–15)
BUN: 17 mg/dL (ref 6–20)
CO2: 24 mmol/L (ref 22–32)
Calcium: 8.5 mg/dL — ABNORMAL LOW (ref 8.9–10.3)
Chloride: 108 mmol/L (ref 101–111)
Creatinine, Ser: 1.48 mg/dL — ABNORMAL HIGH (ref 0.61–1.24)
GFR calc Af Amer: >60 mL/min (ref >60)
GFR calc non Af Amer: 52 mL/min — ABNORMAL LOW (ref >60)
Glucose, Bld: 246 mg/dL — ABNORMAL HIGH (ref 65–99)
Potassium: 4.1 mmol/L (ref 3.5–5.1)
Sodium: 139 mmol/L (ref 135–145)
Total Bilirubin: 0.4 mg/dL (ref 0.3–1.2)
Total Protein: 6.9 g/dL (ref 6.5–8.1)

## 2015-11-21 LAB — URINALYSIS, ROUTINE W REFLEX MICROSCOPIC
Bilirubin Urine: NEGATIVE
Glucose, UA: NEGATIVE mg/dL
Hgb urine dipstick: NEGATIVE
Ketones, ur: NEGATIVE mg/dL
Leukocytes, UA: NEGATIVE
Nitrite: NEGATIVE
Protein, ur: 100 mg/dL — AB
Specific Gravity, Urine: 1.012 (ref 1.005–1.030)
pH: 5.5 (ref 5.0–8.0)

## 2015-11-21 LAB — PHOSPHORUS: Phosphorus: 3.3 mg/dL (ref 2.5–4.6)

## 2015-11-21 MED ORDER — SODIUM CHLORIDE 0.9 % IV SOLN
115.0000 mg | INTRAVENOUS | Status: DC
  Administered 2015-11-21: 115 mg via INTRAVENOUS
  Filled 2015-11-21: qty 21

## 2015-11-21 MED ORDER — SODIUM CHLORIDE 0.9 % IV SOLN
INTRAVENOUS | Status: DC
  Administered 2015-11-21: 09:00:00 via INTRAVENOUS

## 2015-11-21 NOTE — Discharge Instructions (Signed)
Agalsidase Beta injection °What is this medicine? °AGALSIDASE BETA is used to replace an enzyme that is missing in patients with Fabry disease. It is not a cure. °This medicine may be used for other purposes; ask your health care provider or pharmacist if you have questions. °What should I tell my health care provider before I take this medicine? °They need to know if you have any of these conditions: °-heart disease °-an unusual or allergic reaction to agalsidase beta, mannitol, other medicines, foods, dyes, or preservatives °-pregnant or trying to get pregnant °-breast-feeding °How should I use this medicine? °This medicine is for infusion into a vein. It is given by a health care professional in a hospital or clinic setting. °Talk to your pediatrician regarding the use of this medicine in children. Special care may be needed. °Overdosage: If you think you have taken too much of this medicine contact a poison control center or emergency room at once. °NOTE: This medicine is only for you. Do not share this medicine with others. °What if I miss a dose? °It is important not to miss your dose. Call your doctor or health care professional if you are unable to keep an appointment. °What may interact with this medicine? °-amiodarone °-chloroquine °-gentamicin °-hydroxychloroquine °-monobenzone °This list may not describe all possible interactions. Give your health care provider a list of all the medicines, herbs, non-prescription drugs, or dietary supplements you use. Also tell them if you smoke, drink alcohol, or use illegal drugs. Some items may interact with your medicine. °What should I watch for while using this medicine? °Visit your doctor or health care professional for regular checks on your progress. Tell your doctor or healthcare professional if your symptoms do not start to get better or if they get worse. °There is a registry for patients with Fabry disease. The registry is used to gather information about  the disease and its effects. Talk to your health care provider if you would like to join the registry. °What side effects may I notice from receiving this medicine? °Side effects that you should report to your doctor or health care professional as soon as possible: °-allergic reactions like skin rash, itching or hives, swelling of the face, lips, or tongue °-breathing problems °-chest pain, tightness °-depression °-dizziness °-fast, irregular heart beat °-swelling of the arms or legs °Side effects that usually do not require medical attention (report to your doctor or health care professional if they continue or are bothersome): °-aches or pains °-anxiety °-fever or chills at the time of injection °-headache °-nausea, vomiting °-stomach pain, upset °This list may not describe all possible side effects. Call your doctor for medical advice about side effects. You may report side effects to FDA at 1-800-FDA-1088. °Where should I keep my medicine? °This drug is given in a hospital or clinic and will not be stored at home. °NOTE: This sheet is a summary. It may not cover all possible information. If you have questions about this medicine, talk to your doctor, pharmacist, or health care provider. °  °© 2016, Elsevier/Gold Standard. (2006-01-31 12:25:00) ° °

## 2015-12-05 ENCOUNTER — Encounter (HOSPITAL_COMMUNITY)
Admission: RE | Admit: 2015-12-05 | Discharge: 2015-12-05 | Disposition: A | Discharge status: Home / Self Care | Payer: Medicare Other | Source: Ambulatory Visit | Attending: Nephrology | Admitting: Nephrology

## 2015-12-05 ENCOUNTER — Encounter (HOSPITAL_COMMUNITY): Payer: Self-pay

## 2015-12-05 DIAGNOSIS — E7521 Fabry (-Anderson) disease: Secondary | ICD-10-CM | POA: Diagnosis not present

## 2015-12-05 DIAGNOSIS — E1101 Type 2 diabetes mellitus with hyperosmolarity with coma: Secondary | ICD-10-CM | POA: Diagnosis not present

## 2015-12-05 MED ORDER — SODIUM CHLORIDE 0.9 % IV SOLN
INTRAVENOUS | Status: DC
  Administered 2015-12-05: 09:00:00 via INTRAVENOUS

## 2015-12-05 MED ORDER — SODIUM CHLORIDE 0.9 % IV SOLN
115.0000 mg | INTRAVENOUS | Status: DC
  Administered 2015-12-05: 115 mg via INTRAVENOUS
  Filled 2015-12-05: qty 21

## 2015-12-19 ENCOUNTER — Encounter (HOSPITAL_COMMUNITY): Payer: Self-pay

## 2015-12-19 ENCOUNTER — Encounter (HOSPITAL_COMMUNITY)
Admission: RE | Admit: 2015-12-19 | Discharge: 2015-12-19 | Disposition: A | Discharge status: Home / Self Care | Payer: Medicare Other | Source: Ambulatory Visit | Attending: Nephrology | Admitting: Nephrology

## 2015-12-19 DIAGNOSIS — E7521 Fabry (-Anderson) disease: Secondary | ICD-10-CM | POA: Insufficient documentation

## 2015-12-19 DIAGNOSIS — E1101 Type 2 diabetes mellitus with hyperosmolarity with coma: Secondary | ICD-10-CM | POA: Insufficient documentation

## 2015-12-19 LAB — COMPREHENSIVE METABOLIC PANEL
ALT: 17 U/L (ref 17–63)
AST: 26 U/L (ref 15–41)
Albumin: 3.2 g/dL — ABNORMAL LOW (ref 3.5–5.0)
Alkaline Phosphatase: 62 U/L (ref 38–126)
Anion gap: 8 (ref 5–15)
BUN: 16 mg/dL (ref 6–20)
CO2: 24 mmol/L (ref 22–32)
Calcium: 8.7 mg/dL — ABNORMAL LOW (ref 8.9–10.3)
Chloride: 108 mmol/L (ref 101–111)
Creatinine, Ser: 1.35 mg/dL — ABNORMAL HIGH (ref 0.61–1.24)
GFR calc Af Amer: >60 mL/min (ref >60)
GFR calc non Af Amer: 58 mL/min — ABNORMAL LOW (ref >60)
Glucose, Bld: 164 mg/dL — ABNORMAL HIGH (ref 65–99)
Potassium: 4.1 mmol/L (ref 3.5–5.1)
Sodium: 140 mmol/L (ref 135–145)
Total Bilirubin: 0.3 mg/dL (ref 0.3–1.2)
Total Protein: 7.4 g/dL (ref 6.5–8.1)

## 2015-12-19 LAB — URINE MICROSCOPIC-ADD ON
Bacteria, UA: NONE SEEN
RBC / HPF: NONE SEEN RBC/hpf (ref 0–5)

## 2015-12-19 LAB — URINALYSIS, ROUTINE W REFLEX MICROSCOPIC
Bilirubin Urine: NEGATIVE
Glucose, UA: NEGATIVE mg/dL
Hgb urine dipstick: NEGATIVE
Ketones, ur: NEGATIVE mg/dL
Leukocytes, UA: NEGATIVE
Nitrite: NEGATIVE
Protein, ur: 100 mg/dL — AB
Specific Gravity, Urine: 1.011 (ref 1.005–1.030)
pH: 6 (ref 5.0–8.0)

## 2015-12-19 LAB — PHOSPHORUS: Phosphorus: 3.4 mg/dL (ref 2.5–4.6)

## 2015-12-19 MED ORDER — SODIUM CHLORIDE 0.9 % IV SOLN
INTRAVENOUS | Status: DC
  Administered 2015-12-19: 09:00:00 via INTRAVENOUS

## 2015-12-19 MED ORDER — SODIUM CHLORIDE 0.9 % IV SOLN
115.0000 mg | INTRAVENOUS | Status: DC
  Administered 2015-12-19: 115 mg via INTRAVENOUS
  Filled 2015-12-19: qty 21

## 2015-12-19 NOTE — Discharge Instructions (Signed)
Agalsidase Beta injection °What is this medicine? °AGALSIDASE BETA is used to replace an enzyme that is missing in patients with Fabry disease. It is not a cure. °This medicine may be used for other purposes; ask your health care provider or pharmacist if you have questions. °What should I tell my health care provider before I take this medicine? °They need to know if you have any of these conditions: °-heart disease °-an unusual or allergic reaction to agalsidase beta, mannitol, other medicines, foods, dyes, or preservatives °-pregnant or trying to get pregnant °-breast-feeding °How should I use this medicine? °This medicine is for infusion into a vein. It is given by a health care professional in a hospital or clinic setting. °Talk to your pediatrician regarding the use of this medicine in children. Special care may be needed. °Overdosage: If you think you have taken too much of this medicine contact a poison control center or emergency room at once. °NOTE: This medicine is only for you. Do not share this medicine with others. °What if I miss a dose? °It is important not to miss your dose. Call your doctor or health care professional if you are unable to keep an appointment. °What may interact with this medicine? °-amiodarone °-chloroquine °-gentamicin °-hydroxychloroquine °-monobenzone °This list may not describe all possible interactions. Give your health care provider a list of all the medicines, herbs, non-prescription drugs, or dietary supplements you use. Also tell them if you smoke, drink alcohol, or use illegal drugs. Some items may interact with your medicine. °What should I watch for while using this medicine? °Visit your doctor or health care professional for regular checks on your progress. Tell your doctor or healthcare professional if your symptoms do not start to get better or if they get worse. °There is a registry for patients with Fabry disease. The registry is used to gather information about  the disease and its effects. Talk to your health care provider if you would like to join the registry. °What side effects may I notice from receiving this medicine? °Side effects that you should report to your doctor or health care professional as soon as possible: °-allergic reactions like skin rash, itching or hives, swelling of the face, lips, or tongue °-breathing problems °-chest pain, tightness °-depression °-dizziness °-fast, irregular heart beat °-swelling of the arms or legs °Side effects that usually do not require medical attention (report to your doctor or health care professional if they continue or are bothersome): °-aches or pains °-anxiety °-fever or chills at the time of injection °-headache °-nausea, vomiting °-stomach pain, upset °This list may not describe all possible side effects. Call your doctor for medical advice about side effects. You may report side effects to FDA at 1-800-FDA-1088. °Where should I keep my medicine? °This drug is given in a hospital or clinic and will not be stored at home. °NOTE: This sheet is a summary. It may not cover all possible information. If you have questions about this medicine, talk to your doctor, pharmacist, or health care provider. °  °© 2016, Elsevier/Gold Standard. (2006-01-31 12:25:00) ° °

## 2015-12-23 DIAGNOSIS — H35033 Hypertensive retinopathy, bilateral: Secondary | ICD-10-CM | POA: Diagnosis not present

## 2015-12-23 DIAGNOSIS — E119 Type 2 diabetes mellitus without complications: Secondary | ICD-10-CM | POA: Diagnosis not present

## 2015-12-23 DIAGNOSIS — E7521 Fabry (-Anderson) disease: Secondary | ICD-10-CM | POA: Diagnosis not present

## 2015-12-23 DIAGNOSIS — H2513 Age-related nuclear cataract, bilateral: Secondary | ICD-10-CM | POA: Diagnosis not present

## 2016-01-02 ENCOUNTER — Encounter (HOSPITAL_COMMUNITY)
Admission: RE | Admit: 2016-01-02 | Discharge: 2016-01-02 | Disposition: A | Discharge status: Home / Self Care | Payer: Medicare Other | Source: Ambulatory Visit | Attending: Nephrology | Admitting: Nephrology

## 2016-01-02 ENCOUNTER — Encounter (HOSPITAL_COMMUNITY): Payer: Self-pay

## 2016-01-02 DIAGNOSIS — E1101 Type 2 diabetes mellitus with hyperosmolarity with coma: Secondary | ICD-10-CM | POA: Diagnosis not present

## 2016-01-02 DIAGNOSIS — E7521 Fabry (-Anderson) disease: Secondary | ICD-10-CM | POA: Diagnosis not present

## 2016-01-02 LAB — CBC WITH DIFFERENTIAL/PLATELET
Basophils Absolute: 0 10*3/uL (ref 0.0–0.1)
Basophils Relative: 0 %
Eosinophils Absolute: 0.4 10*3/uL (ref 0.0–0.7)
Eosinophils Relative: 6 %
HCT: 46 % (ref 39.0–52.0)
Hemoglobin: 15.5 g/dL (ref 13.0–17.0)
Lymphocytes Relative: 45 %
Lymphs Abs: 3.2 10*3/uL (ref 0.7–4.0)
MCH: 30 pg (ref 26.0–34.0)
MCHC: 33.7 g/dL (ref 30.0–36.0)
MCV: 89 fL (ref 78.0–100.0)
Monocytes Absolute: 0.8 10*3/uL (ref 0.1–1.0)
Monocytes Relative: 11 %
Neutro Abs: 2.7 10*3/uL (ref 1.7–7.7)
Neutrophils Relative %: 38 %
Platelets: 197 10*3/uL (ref 150–400)
RBC: 5.17 MIL/uL (ref 4.22–5.81)
RDW: 12.8 % (ref 11.5–15.5)
WBC: 7.1 10*3/uL (ref 4.0–10.5)

## 2016-01-02 LAB — LIPID PANEL
Cholesterol: 270 mg/dL — ABNORMAL HIGH (ref 0–200)
HDL: 46 mg/dL (ref >40)
LDL Cholesterol: 193 mg/dL — ABNORMAL HIGH (ref 0–99)
Total CHOL/HDL Ratio: 5.9 RATIO
Triglycerides: 153 mg/dL — ABNORMAL HIGH (ref <150)
VLDL: 31 mg/dL (ref 0–40)

## 2016-01-02 LAB — IRON AND TIBC
Iron: 78 ug/dL (ref 45–182)
Saturation Ratios: 27 % (ref 17.9–39.5)
TIBC: 293 ug/dL (ref 250–450)
UIBC: 215 ug/dL

## 2016-01-02 LAB — FERRITIN: Ferritin: 167 ng/mL (ref 24–336)

## 2016-01-02 MED ORDER — SODIUM CHLORIDE 0.9 % IV SOLN
115.0000 mg | INTRAVENOUS | Status: DC
  Administered 2016-01-02: 115 mg via INTRAVENOUS
  Filled 2016-01-02: qty 21

## 2016-01-02 MED ORDER — SODIUM CHLORIDE 0.9 % IV SOLN
INTRAVENOUS | Status: DC
  Administered 2016-01-02: 09:00:00 via INTRAVENOUS

## 2016-01-16 ENCOUNTER — Encounter (HOSPITAL_COMMUNITY): Admission: RE | Admit: 2016-01-16 | Payer: Medicare Other | Source: Ambulatory Visit

## 2016-01-30 ENCOUNTER — Encounter (HOSPITAL_COMMUNITY): Payer: Self-pay

## 2016-01-30 ENCOUNTER — Encounter (HOSPITAL_COMMUNITY)
Admission: RE | Admit: 2016-01-30 | Discharge: 2016-01-30 | Disposition: A | Discharge status: Home / Self Care | Payer: Medicare Other | Source: Ambulatory Visit | Attending: Nephrology | Admitting: Nephrology

## 2016-01-30 DIAGNOSIS — E7521 Fabry (-Anderson) disease: Secondary | ICD-10-CM | POA: Insufficient documentation

## 2016-01-30 DIAGNOSIS — E1101 Type 2 diabetes mellitus with hyperosmolarity with coma: Secondary | ICD-10-CM | POA: Insufficient documentation

## 2016-01-30 LAB — COMPREHENSIVE METABOLIC PANEL
ALT: 29 U/L (ref 17–63)
AST: 30 U/L (ref 15–41)
Albumin: 3.5 g/dL (ref 3.5–5.0)
Alkaline Phosphatase: 73 U/L (ref 38–126)
Anion gap: 6 (ref 5–15)
BUN: 21 mg/dL — ABNORMAL HIGH (ref 6–20)
CO2: 26 mmol/L (ref 22–32)
Calcium: 9.5 mg/dL (ref 8.9–10.3)
Chloride: 108 mmol/L (ref 101–111)
Creatinine, Ser: 1.38 mg/dL — ABNORMAL HIGH (ref 0.61–1.24)
GFR calc Af Amer: >60 mL/min (ref >60)
GFR calc non Af Amer: 56 mL/min — ABNORMAL LOW (ref >60)
Glucose, Bld: 191 mg/dL — ABNORMAL HIGH (ref 65–99)
Potassium: 4.3 mmol/L (ref 3.5–5.1)
Sodium: 140 mmol/L (ref 135–145)
Total Bilirubin: 0.4 mg/dL (ref 0.3–1.2)
Total Protein: 7.8 g/dL (ref 6.5–8.1)

## 2016-01-30 LAB — URINALYSIS, ROUTINE W REFLEX MICROSCOPIC
Bilirubin Urine: NEGATIVE
Glucose, UA: NEGATIVE mg/dL
Hgb urine dipstick: NEGATIVE
Ketones, ur: NEGATIVE mg/dL
Leukocytes, UA: NEGATIVE
Nitrite: NEGATIVE
Protein, ur: 100 mg/dL — AB
Specific Gravity, Urine: 1.01 (ref 1.005–1.030)
pH: 5.5 (ref 5.0–8.0)

## 2016-01-30 LAB — URINE MICROSCOPIC-ADD ON
RBC / HPF: NONE SEEN RBC/hpf (ref 0–5)
Squamous Epithelial / LPF: NONE SEEN

## 2016-01-30 LAB — PHOSPHORUS: Phosphorus: 4.2 mg/dL (ref 2.5–4.6)

## 2016-01-30 MED ORDER — SODIUM CHLORIDE 0.9 % IV SOLN
115.0000 mg | INTRAVENOUS | Status: DC
  Administered 2016-01-30: 115 mg via INTRAVENOUS
  Filled 2016-01-30: qty 21

## 2016-01-30 MED ORDER — SODIUM CHLORIDE 0.9 % IV SOLN
INTRAVENOUS | Status: DC
  Administered 2016-01-30: 10:00:00 via INTRAVENOUS

## 2016-01-30 NOTE — Progress Notes (Signed)
Cmp, phosphorus drawn and sent to lab.  Pt also voided in cup and u/a sent to lab as well.

## 2016-02-18 ENCOUNTER — Encounter (HOSPITAL_COMMUNITY)
Admission: RE | Admit: 2016-02-18 | Discharge: 2016-02-18 | Disposition: A | Discharge status: Home / Self Care | Payer: Medicare Other | Source: Ambulatory Visit | Attending: Nephrology | Admitting: Nephrology

## 2016-02-18 ENCOUNTER — Encounter (HOSPITAL_COMMUNITY): Payer: Self-pay

## 2016-02-18 DIAGNOSIS — E7521 Fabry (-Anderson) disease: Secondary | ICD-10-CM | POA: Diagnosis not present

## 2016-02-18 DIAGNOSIS — E1101 Type 2 diabetes mellitus with hyperosmolarity with coma: Secondary | ICD-10-CM | POA: Insufficient documentation

## 2016-02-18 MED ORDER — ACETAMINOPHEN 500 MG PO TABS
500.0000 mg | ORAL_TABLET | Freq: Once | ORAL | Status: AC
  Administered 2016-02-18: 500 mg via ORAL
  Filled 2016-02-18: qty 1

## 2016-02-18 MED ORDER — SODIUM CHLORIDE 0.9 % IV SOLN
INTRAVENOUS | Status: DC
  Administered 2016-02-18: 11:00:00 via INTRAVENOUS

## 2016-02-18 MED ORDER — SODIUM CHLORIDE 0.9 % IV SOLN
115.0000 mg | INTRAVENOUS | Status: DC
  Administered 2016-02-18: 115 mg via INTRAVENOUS
  Filled 2016-02-18: qty 21

## 2016-02-18 MED ORDER — LORATADINE 10 MG PO TABS
10.0000 mg | ORAL_TABLET | Freq: Once | ORAL | Status: AC
  Administered 2016-02-18: 10 mg via ORAL
  Filled 2016-02-18: qty 1

## 2016-02-18 NOTE — Discharge Instructions (Signed)
Agalsidase Beta injection What is this medicine? AGALSIDASE BETA is used to replace an enzyme that is missing in patients with Fabry disease. It is not a cure. This medicine may be used for other purposes; ask your health care provider or pharmacist if you have questions. What should I tell my health care provider before I take this medicine? They need to know if you have any of these conditions: -heart disease -an unusual or allergic reaction to agalsidase beta, mannitol, other medicines, foods, dyes, or preservatives -pregnant or trying to get pregnant -breast-feeding How should I use this medicine? This medicine is for infusion into a vein. It is given by a health care professional in a hospital or clinic setting. Talk to your pediatrician regarding the use of this medicine in children. Special care may be needed. Overdosage: If you think you have taken too much of this medicine contact a poison control center or emergency room at once. NOTE: This medicine is only for you. Do not share this medicine with others. What if I miss a dose? It is important not to miss your dose. Call your doctor or health care professional if you are unable to keep an appointment. What may interact with this medicine? -amiodarone -chloroquine -gentamicin -hydroxychloroquine -monobenzone This list may not describe all possible interactions. Give your health care provider a list of all the medicines, herbs, non-prescription drugs, or dietary supplements you use. Also tell them if you smoke, drink alcohol, or use illegal drugs. Some items may interact with your medicine. What should I watch for while using this medicine? Visit your doctor or health care professional for regular checks on your progress. Tell your doctor or healthcare professional if your symptoms do not start to get better or if they get worse. There is a registry for patients with Fabry disease. The registry is used to gather information about  the disease and its effects. Talk to your health care provider if you would like to join the registry. What side effects may I notice from receiving this medicine? Side effects that you should report to your doctor or health care professional as soon as possible: -allergic reactions like skin rash, itching or hives, swelling of the face, lips, or tongue -breathing problems -chest pain, tightness -depression -dizziness -fast, irregular heart beat -swelling of the arms or legs Side effects that usually do not require medical attention (report to your doctor or health care professional if they continue or are bothersome): -aches or pains -anxiety -fever or chills at the time of injection -headache -nausea, vomiting -stomach pain, upset This list may not describe all possible side effects. Call your doctor for medical advice about side effects. You may report side effects to FDA at 1-800-FDA-1088. Where should I keep my medicine? This drug is given in a hospital or clinic and will not be stored at home. NOTE: This sheet is a summary. It may not cover all possible information. If you have questions about this medicine, talk to your doctor, pharmacist, or health care provider.    2016, Elsevier/Gold Standard. (2006-01-31 12:25:00)

## 2016-02-25 DIAGNOSIS — I890 Lymphedema, not elsewhere classified: Secondary | ICD-10-CM | POA: Diagnosis not present

## 2016-02-25 DIAGNOSIS — N182 Chronic kidney disease, stage 2 (mild): Secondary | ICD-10-CM | POA: Diagnosis not present

## 2016-02-25 DIAGNOSIS — I129 Hypertensive chronic kidney disease with stage 1 through stage 4 chronic kidney disease, or unspecified chronic kidney disease: Secondary | ICD-10-CM | POA: Diagnosis not present

## 2016-02-25 DIAGNOSIS — E7521 Fabry (-Anderson) disease: Secondary | ICD-10-CM | POA: Diagnosis not present

## 2016-02-25 DIAGNOSIS — E118 Type 2 diabetes mellitus with unspecified complications: Secondary | ICD-10-CM | POA: Diagnosis not present

## 2016-03-05 ENCOUNTER — Encounter (HOSPITAL_COMMUNITY): Payer: Self-pay

## 2016-03-05 ENCOUNTER — Encounter (HOSPITAL_COMMUNITY)
Admission: RE | Admit: 2016-03-05 | Discharge: 2016-03-05 | Disposition: A | Discharge status: Home / Self Care | Payer: Medicare Other | Source: Ambulatory Visit | Attending: Nephrology | Admitting: Nephrology

## 2016-03-05 DIAGNOSIS — E7521 Fabry (-Anderson) disease: Secondary | ICD-10-CM | POA: Diagnosis not present

## 2016-03-05 DIAGNOSIS — E1101 Type 2 diabetes mellitus with hyperosmolarity with coma: Secondary | ICD-10-CM | POA: Diagnosis not present

## 2016-03-05 LAB — COMPREHENSIVE METABOLIC PANEL
ALT: 27 U/L (ref 17–63)
AST: 32 U/L (ref 15–41)
Albumin: 3.4 g/dL — ABNORMAL LOW (ref 3.5–5.0)
Alkaline Phosphatase: 76 U/L (ref 38–126)
Anion gap: 9 (ref 5–15)
BUN: 16 mg/dL (ref 6–20)
CO2: 23 mmol/L (ref 22–32)
Calcium: 8.8 mg/dL — ABNORMAL LOW (ref 8.9–10.3)
Chloride: 109 mmol/L (ref 101–111)
Creatinine, Ser: 1.39 mg/dL — ABNORMAL HIGH (ref 0.61–1.24)
GFR calc Af Amer: >60 mL/min (ref >60)
GFR calc non Af Amer: 56 mL/min — ABNORMAL LOW (ref >60)
Glucose, Bld: 176 mg/dL — ABNORMAL HIGH (ref 65–99)
Potassium: 3.9 mmol/L (ref 3.5–5.1)
Sodium: 141 mmol/L (ref 135–145)
Total Bilirubin: 0.7 mg/dL (ref 0.3–1.2)
Total Protein: 7.9 g/dL (ref 6.5–8.1)

## 2016-03-05 LAB — URINALYSIS, ROUTINE W REFLEX MICROSCOPIC
Bilirubin Urine: NEGATIVE
Glucose, UA: NEGATIVE mg/dL
Hgb urine dipstick: NEGATIVE
Ketones, ur: NEGATIVE mg/dL
Leukocytes, UA: NEGATIVE
Nitrite: NEGATIVE
Protein, ur: 100 mg/dL — AB
Specific Gravity, Urine: 1.014 (ref 1.005–1.030)
pH: 5.5 (ref 5.0–8.0)

## 2016-03-05 LAB — URINE MICROSCOPIC-ADD ON: Squamous Epithelial / LPF: NONE SEEN

## 2016-03-05 LAB — PHOSPHORUS: Phosphorus: 2.9 mg/dL (ref 2.5–4.6)

## 2016-03-05 MED ORDER — SODIUM CHLORIDE 0.9 % IV SOLN
INTRAVENOUS | Status: DC
  Administered 2016-03-05: 10:00:00 via INTRAVENOUS

## 2016-03-05 MED ORDER — SODIUM CHLORIDE 0.9 % IV SOLN
115.0000 mg | INTRAVENOUS | Status: DC
  Administered 2016-03-05: 115 mg via INTRAVENOUS
  Filled 2016-03-05: qty 21

## 2016-03-05 NOTE — Discharge Instructions (Signed)
Agalsidase Beta injection What is this medicine? AGALSIDASE BETA is used to replace an enzyme that is missing in patients with Fabry disease. It is not a cure. This medicine may be used for other purposes; ask your health care provider or pharmacist if you have questions. What should I tell my health care provider before I take this medicine? They need to know if you have any of these conditions: -heart disease -an unusual or allergic reaction to agalsidase beta, mannitol, other medicines, foods, dyes, or preservatives -pregnant or trying to get pregnant -breast-feeding How should I use this medicine? This medicine is for infusion into a vein. It is given by a health care professional in a hospital or clinic setting. Talk to your pediatrician regarding the use of this medicine in children. Special care may be needed. Overdosage: If you think you have taken too much of this medicine contact a poison control center or emergency room at once. NOTE: This medicine is only for you. Do not share this medicine with others. What if I miss a dose? It is important not to miss your dose. Call your doctor or health care professional if you are unable to keep an appointment. What may interact with this medicine? -amiodarone -chloroquine -gentamicin -hydroxychloroquine -monobenzone This list may not describe all possible interactions. Give your health care provider a list of all the medicines, herbs, non-prescription drugs, or dietary supplements you use. Also tell them if you smoke, drink alcohol, or use illegal drugs. Some items may interact with your medicine. What should I watch for while using this medicine? Visit your doctor or health care professional for regular checks on your progress. Tell your doctor or healthcare professional if your symptoms do not start to get better or if they get worse. There is a registry for patients with Fabry disease. The registry is used to gather information about  the disease and its effects. Talk to your health care provider if you would like to join the registry. What side effects may I notice from receiving this medicine? Side effects that you should report to your doctor or health care professional as soon as possible: -allergic reactions like skin rash, itching or hives, swelling of the face, lips, or tongue -breathing problems -chest pain, tightness -depression -dizziness -fast, irregular heart beat -swelling of the arms or legs Side effects that usually do not require medical attention (report to your doctor or health care professional if they continue or are bothersome): -aches or pains -anxiety -fever or chills at the time of injection -headache -nausea, vomiting -stomach pain, upset This list may not describe all possible side effects. Call your doctor for medical advice about side effects. You may report side effects to FDA at 1-800-FDA-1088. Where should I keep my medicine? This drug is given in a hospital or clinic and will not be stored at home. NOTE: This sheet is a summary. It may not cover all possible information. If you have questions about this medicine, talk to your doctor, pharmacist, or health care provider.    2016, Elsevier/Gold Standard. (2006-01-31 12:25:00)

## 2016-03-19 ENCOUNTER — Encounter (HOSPITAL_COMMUNITY): Payer: Self-pay

## 2016-03-19 ENCOUNTER — Encounter (HOSPITAL_COMMUNITY)
Admission: RE | Admit: 2016-03-19 | Discharge: 2016-03-19 | Disposition: A | Discharge status: Home / Self Care | Payer: Medicare Other | Source: Ambulatory Visit | Attending: Nephrology | Admitting: Nephrology

## 2016-03-19 DIAGNOSIS — E7521 Fabry (-Anderson) disease: Secondary | ICD-10-CM | POA: Insufficient documentation

## 2016-03-19 DIAGNOSIS — E1101 Type 2 diabetes mellitus with hyperosmolarity with coma: Secondary | ICD-10-CM | POA: Diagnosis not present

## 2016-03-19 MED ORDER — SODIUM CHLORIDE 0.9 % IV SOLN
115.0000 mg | INTRAVENOUS | Status: DC
  Administered 2016-03-19: 115 mg via INTRAVENOUS
  Filled 2016-03-19: qty 21

## 2016-03-19 MED ORDER — SODIUM CHLORIDE 0.9 % IV SOLN
INTRAVENOUS | Status: DC
  Administered 2016-03-19: 08:00:00 via INTRAVENOUS

## 2016-03-19 NOTE — Discharge Instructions (Signed)
Agalsidase Beta injection What is this medicine? AGALSIDASE BETA is used to replace an enzyme that is missing in patients with Fabry disease. It is not a cure. This medicine may be used for other purposes; ask your health care provider or pharmacist if you have questions. What should I tell my health care provider before I take this medicine? They need to know if you have any of these conditions: -heart disease -an unusual or allergic reaction to agalsidase beta, mannitol, other medicines, foods, dyes, or preservatives -pregnant or trying to get pregnant -breast-feeding How should I use this medicine? This medicine is for infusion into a vein. It is given by a health care professional in a hospital or clinic setting. Talk to your pediatrician regarding the use of this medicine in children. Special care may be needed. Overdosage: If you think you have taken too much of this medicine contact a poison control center or emergency room at once. NOTE: This medicine is only for you. Do not share this medicine with others. What if I miss a dose? It is important not to miss your dose. Call your doctor or health care professional if you are unable to keep an appointment. What may interact with this medicine? -amiodarone -chloroquine -gentamicin -hydroxychloroquine -monobenzone This list may not describe all possible interactions. Give your health care provider a list of all the medicines, herbs, non-prescription drugs, or dietary supplements you use. Also tell them if you smoke, drink alcohol, or use illegal drugs. Some items may interact with your medicine. What should I watch for while using this medicine? Visit your doctor or health care professional for regular checks on your progress. Tell your doctor or healthcare professional if your symptoms do not start to get better or if they get worse. There is a registry for patients with Fabry disease. The registry is used to gather information about  the disease and its effects. Talk to your health care provider if you would like to join the registry. What side effects may I notice from receiving this medicine? Side effects that you should report to your doctor or health care professional as soon as possible: -allergic reactions like skin rash, itching or hives, swelling of the face, lips, or tongue -breathing problems -chest pain, tightness -depression -dizziness -fast, irregular heart beat -swelling of the arms or legs Side effects that usually do not require medical attention (report to your doctor or health care professional if they continue or are bothersome): -aches or pains -anxiety -fever or chills at the time of injection -headache -nausea, vomiting -stomach pain, upset This list may not describe all possible side effects. Call your doctor for medical advice about side effects. You may report side effects to FDA at 1-800-FDA-1088. Where should I keep my medicine? This drug is given in a hospital or clinic and will not be stored at home. NOTE: This sheet is a summary. It may not cover all possible information. If you have questions about this medicine, talk to your doctor, pharmacist, or health care provider.    2016, Elsevier/Gold Standard. (2006-01-31 12:25:00)

## 2016-03-30 ENCOUNTER — Telehealth: Payer: Self-pay | Admitting: Family Medicine

## 2016-03-30 NOTE — Telephone Encounter (Signed)
Patient came by the office to drop off documents that he needs PCP to fill out for medical benefits from previous employer. Please fax once its been completed and call patient to get documents. Pt was here on 4/13. Leaving documents on PCP's documents. Please follow up.  Thank you

## 2016-03-31 NOTE — Telephone Encounter (Signed)
Paperwork has been received, once filled out paperwork will be faxed over.

## 2016-04-02 ENCOUNTER — Encounter (HOSPITAL_COMMUNITY): Payer: Self-pay

## 2016-04-02 ENCOUNTER — Encounter (HOSPITAL_COMMUNITY)
Admission: RE | Admit: 2016-04-02 | Discharge: 2016-04-02 | Disposition: A | Discharge status: Home / Self Care | Payer: Medicare Other | Source: Ambulatory Visit | Attending: Nephrology | Admitting: Nephrology

## 2016-04-02 DIAGNOSIS — E7521 Fabry (-Anderson) disease: Secondary | ICD-10-CM | POA: Diagnosis not present

## 2016-04-02 DIAGNOSIS — E1101 Type 2 diabetes mellitus with hyperosmolarity with coma: Secondary | ICD-10-CM | POA: Diagnosis not present

## 2016-04-02 LAB — CBC WITH DIFFERENTIAL/PLATELET
Basophils Absolute: 0 10*3/uL (ref 0.0–0.1)
Basophils Relative: 0 %
Eosinophils Absolute: 0.4 10*3/uL (ref 0.0–0.7)
Eosinophils Relative: 5 %
HCT: 48.3 % (ref 39.0–52.0)
Hemoglobin: 16.2 g/dL (ref 13.0–17.0)
Lymphocytes Relative: 42 %
Lymphs Abs: 3 10*3/uL (ref 0.7–4.0)
MCH: 30.6 pg (ref 26.0–34.0)
MCHC: 33.5 g/dL (ref 30.0–36.0)
MCV: 91.3 fL (ref 78.0–100.0)
Monocytes Absolute: 0.7 10*3/uL (ref 0.1–1.0)
Monocytes Relative: 10 %
Neutro Abs: 3.1 10*3/uL (ref 1.7–7.7)
Neutrophils Relative %: 43 %
Platelets: 203 10*3/uL (ref 150–400)
RBC: 5.29 MIL/uL (ref 4.22–5.81)
RDW: 12.9 % (ref 11.5–15.5)
WBC: 7.1 10*3/uL (ref 4.0–10.5)

## 2016-04-02 LAB — URINE MICROSCOPIC-ADD ON: RBC / HPF: NONE SEEN RBC/hpf (ref 0–5)

## 2016-04-02 LAB — COMPREHENSIVE METABOLIC PANEL
ALT: 22 U/L (ref 17–63)
AST: 28 U/L (ref 15–41)
Albumin: 3 g/dL — ABNORMAL LOW (ref 3.5–5.0)
Alkaline Phosphatase: 62 U/L (ref 38–126)
Anion gap: 7 (ref 5–15)
BUN: 17 mg/dL (ref 6–20)
CO2: 28 mmol/L (ref 22–32)
Calcium: 8.6 mg/dL — ABNORMAL LOW (ref 8.9–10.3)
Chloride: 109 mmol/L (ref 101–111)
Creatinine, Ser: 1.32 mg/dL — ABNORMAL HIGH (ref 0.61–1.24)
GFR calc Af Amer: >60 mL/min (ref >60)
GFR calc non Af Amer: 59 mL/min — ABNORMAL LOW (ref >60)
Glucose, Bld: 162 mg/dL — ABNORMAL HIGH (ref 65–99)
Potassium: 4.1 mmol/L (ref 3.5–5.1)
Sodium: 144 mmol/L (ref 135–145)
Total Bilirubin: 0.6 mg/dL (ref 0.3–1.2)
Total Protein: 7 g/dL (ref 6.5–8.1)

## 2016-04-02 LAB — URINALYSIS, ROUTINE W REFLEX MICROSCOPIC
Bilirubin Urine: NEGATIVE
Glucose, UA: NEGATIVE mg/dL
Hgb urine dipstick: NEGATIVE
Ketones, ur: NEGATIVE mg/dL
Leukocytes, UA: NEGATIVE
Nitrite: NEGATIVE
Protein, ur: 100 mg/dL — AB
Specific Gravity, Urine: 1.012 (ref 1.005–1.030)
pH: 6 (ref 5.0–8.0)

## 2016-04-02 LAB — LIPID PANEL
Cholesterol: 242 mg/dL — ABNORMAL HIGH (ref 0–200)
HDL: 52 mg/dL (ref >40)
LDL Cholesterol: 164 mg/dL — ABNORMAL HIGH (ref 0–99)
Total CHOL/HDL Ratio: 4.7 RATIO
Triglycerides: 132 mg/dL (ref <150)
VLDL: 26 mg/dL (ref 0–40)

## 2016-04-02 LAB — FERRITIN

## 2016-04-02 LAB — PHOSPHORUS: Phosphorus: 4.1 mg/dL (ref 2.5–4.6)

## 2016-04-02 LAB — IRON AND TIBC

## 2016-04-02 MED ORDER — SODIUM CHLORIDE 0.9 % IV SOLN
115.0000 mg | INTRAVENOUS | Status: DC
  Administered 2016-04-02: 115 mg via INTRAVENOUS
  Filled 2016-04-02: qty 21

## 2016-04-02 MED ORDER — SODIUM CHLORIDE 0.9 % IV SOLN
INTRAVENOUS | Status: DC
  Administered 2016-04-02: 08:00:00 via INTRAVENOUS

## 2016-04-02 NOTE — Progress Notes (Signed)
Unable to draw ferritin and iron & TIBC.  Pt stuck 4 times for lab work, because tubes kept hemolysing. Nurse stuck pt 3 times (2 unsuccessful) and phlebotomy tried x1.   Pt was fasting for his fasting lipid and was very dry.  All other labs were drawn that were due and U/A obtained also.  Will draw the iron/TIBC and Ferritin at next visit in 2 weeks.

## 2016-04-09 NOTE — Telephone Encounter (Signed)
Paperwork completed Please fax then placed in to be scanned

## 2016-04-12 NOTE — Telephone Encounter (Signed)
Paperwork has been faxed over on 11/06.

## 2016-04-16 ENCOUNTER — Encounter (HOSPITAL_COMMUNITY)
Admission: RE | Admit: 2016-04-16 | Discharge: 2016-04-16 | Disposition: A | Discharge status: Home / Self Care | Payer: Medicare Other | Source: Ambulatory Visit | Attending: Nephrology | Admitting: Nephrology

## 2016-04-16 ENCOUNTER — Encounter (HOSPITAL_COMMUNITY): Payer: Self-pay

## 2016-04-16 DIAGNOSIS — E1101 Type 2 diabetes mellitus with hyperosmolarity with coma: Secondary | ICD-10-CM | POA: Diagnosis not present

## 2016-04-16 DIAGNOSIS — E7521 Fabry (-Anderson) disease: Secondary | ICD-10-CM | POA: Diagnosis not present

## 2016-04-16 LAB — IRON AND TIBC
Iron: 56 ug/dL (ref 45–182)
Saturation Ratios: 19 % (ref 17.9–39.5)
TIBC: 298 ug/dL (ref 250–450)
UIBC: 242 ug/dL

## 2016-04-16 LAB — FERRITIN: Ferritin: 130 ng/mL (ref 24–336)

## 2016-04-16 MED ORDER — SODIUM CHLORIDE 0.9 % IV SOLN
115.0000 mg | INTRAVENOUS | Status: DC
  Administered 2016-04-16: 115 mg via INTRAVENOUS
  Filled 2016-04-16: qty 21

## 2016-04-16 MED ORDER — SODIUM CHLORIDE 0.9 % IV SOLN
INTRAVENOUS | Status: DC
  Administered 2016-04-16: 08:00:00 via INTRAVENOUS

## 2016-05-04 ENCOUNTER — Encounter (HOSPITAL_COMMUNITY)
Admission: RE | Admit: 2016-05-04 | Discharge: 2016-05-04 | Disposition: A | Discharge status: Home / Self Care | Payer: Medicare Other | Source: Ambulatory Visit | Attending: Nephrology | Admitting: Nephrology

## 2016-05-13 ENCOUNTER — Encounter (HOSPITAL_COMMUNITY)
Admission: RE | Admit: 2016-05-13 | Discharge: 2016-05-13 | Disposition: A | Discharge status: Home / Self Care | Payer: Medicare Other | Source: Ambulatory Visit | Attending: Nephrology | Admitting: Nephrology

## 2016-05-13 ENCOUNTER — Encounter (HOSPITAL_COMMUNITY): Payer: Self-pay

## 2016-05-13 DIAGNOSIS — E7521 Fabry (-Anderson) disease: Secondary | ICD-10-CM | POA: Diagnosis not present

## 2016-05-13 DIAGNOSIS — E1101 Type 2 diabetes mellitus with hyperosmolarity with coma: Secondary | ICD-10-CM | POA: Diagnosis not present

## 2016-05-13 LAB — COMPREHENSIVE METABOLIC PANEL
ALT: 23 U/L (ref 17–63)
AST: 33 U/L (ref 15–41)
Albumin: 3.1 g/dL — ABNORMAL LOW (ref 3.5–5.0)
Alkaline Phosphatase: 65 U/L (ref 38–126)
Anion gap: 8 (ref 5–15)
BUN: 17 mg/dL (ref 6–20)
CO2: 27 mmol/L (ref 22–32)
Calcium: 8.6 mg/dL — ABNORMAL LOW (ref 8.9–10.3)
Chloride: 107 mmol/L (ref 101–111)
Creatinine, Ser: 1.32 mg/dL — ABNORMAL HIGH (ref 0.61–1.24)
GFR calc Af Amer: >60 mL/min (ref >60)
GFR calc non Af Amer: 59 mL/min — ABNORMAL LOW (ref >60)
Glucose, Bld: 167 mg/dL — ABNORMAL HIGH (ref 65–99)
Potassium: 4 mmol/L (ref 3.5–5.1)
Sodium: 142 mmol/L (ref 135–145)
Total Bilirubin: 0.8 mg/dL (ref 0.3–1.2)
Total Protein: 7.2 g/dL (ref 6.5–8.1)

## 2016-05-13 LAB — URINALYSIS, ROUTINE W REFLEX MICROSCOPIC
Bacteria, UA: NONE SEEN
Bilirubin Urine: NEGATIVE
Glucose, UA: NEGATIVE mg/dL
Hgb urine dipstick: NEGATIVE
Ketones, ur: NEGATIVE mg/dL
Leukocytes, UA: NEGATIVE
Nitrite: NEGATIVE
Protein, ur: 100 mg/dL — AB
Specific Gravity, Urine: 1.01 (ref 1.005–1.030)
WBC, UA: NONE SEEN WBC/hpf (ref 0–5)
pH: 6 (ref 5.0–8.0)

## 2016-05-13 LAB — PHOSPHORUS: Phosphorus: 3.8 mg/dL (ref 2.5–4.6)

## 2016-05-13 MED ORDER — SODIUM CHLORIDE 0.9 % IV SOLN
INTRAVENOUS | Status: DC
  Administered 2016-05-13: 08:00:00 via INTRAVENOUS

## 2016-05-13 MED ORDER — SODIUM CHLORIDE 0.9 % IV SOLN
115.0000 mg | INTRAVENOUS | Status: DC
  Administered 2016-05-13: 115 mg via INTRAVENOUS
  Filled 2016-05-13: qty 23

## 2016-05-18 ENCOUNTER — Encounter (HOSPITAL_COMMUNITY): Payer: Medicare Other

## 2016-05-19 ENCOUNTER — Other Ambulatory Visit: Payer: Self-pay | Admitting: Family Medicine

## 2016-05-19 DIAGNOSIS — E118 Type 2 diabetes mellitus with unspecified complications: Principal | ICD-10-CM

## 2016-05-19 DIAGNOSIS — E1165 Type 2 diabetes mellitus with hyperglycemia: Secondary | ICD-10-CM

## 2016-05-27 ENCOUNTER — Encounter (HOSPITAL_COMMUNITY)
Admission: RE | Admit: 2016-05-27 | Discharge: 2016-05-27 | Disposition: A | Discharge status: Home / Self Care | Payer: Medicare Other | Source: Ambulatory Visit | Attending: Nephrology | Admitting: Nephrology

## 2016-05-27 ENCOUNTER — Encounter (HOSPITAL_COMMUNITY): Payer: Self-pay

## 2016-05-27 DIAGNOSIS — E1101 Type 2 diabetes mellitus with hyperosmolarity with coma: Secondary | ICD-10-CM | POA: Diagnosis not present

## 2016-05-27 DIAGNOSIS — E7521 Fabry (-Anderson) disease: Secondary | ICD-10-CM | POA: Diagnosis not present

## 2016-05-27 MED ORDER — SODIUM CHLORIDE 0.9 % IV SOLN
INTRAVENOUS | Status: DC
  Administered 2016-05-27: 08:00:00 via INTRAVENOUS

## 2016-05-27 MED ORDER — SODIUM CHLORIDE 0.9 % IV SOLN
115.0000 mg | INTRAVENOUS | Status: DC
  Administered 2016-05-27: 115 mg via INTRAVENOUS
  Filled 2016-05-27: qty 21

## 2016-06-02 ENCOUNTER — Encounter (HOSPITAL_COMMUNITY): Payer: Medicare Other

## 2016-06-02 ENCOUNTER — Ambulatory Visit (INDEPENDENT_AMBULATORY_CARE_PROVIDER_SITE_OTHER): Payer: Medicare Other | Admitting: Internal Medicine

## 2016-06-02 ENCOUNTER — Encounter: Payer: Self-pay | Admitting: Internal Medicine

## 2016-06-02 VITALS — BP 112/80 | HR 77 | Ht 70.0 in | Wt 287.8 lb

## 2016-06-02 DIAGNOSIS — I421 Obstructive hypertrophic cardiomyopathy: Secondary | ICD-10-CM | POA: Diagnosis not present

## 2016-06-02 DIAGNOSIS — I441 Atrioventricular block, second degree: Secondary | ICD-10-CM

## 2016-06-02 DIAGNOSIS — E7521 Fabry (-Anderson) disease: Secondary | ICD-10-CM

## 2016-06-02 DIAGNOSIS — I10 Essential (primary) hypertension: Secondary | ICD-10-CM

## 2016-06-02 DIAGNOSIS — Z950 Presence of cardiac pacemaker: Secondary | ICD-10-CM

## 2016-06-02 LAB — CUP PACEART INCLINIC DEVICE CHECK
Battery Voltage: 2.9 V
Brady Statistic RA Percent Paced: 93 %
Brady Statistic RV Percent Paced: 99.88 %
Date Time Interrogation Session: 20171227153535
Implantable Lead Implant Date: 20120907
Implantable Lead Implant Date: 20120907
Implantable Lead Location: 753859
Implantable Lead Location: 753860
Implantable Pulse Generator Implant Date: 20120907
Lead Channel Impedance Value: 337.5 Ohm
Lead Channel Impedance Value: 462.5 Ohm
Lead Channel Pacing Threshold Amplitude: 0.75 V
Lead Channel Pacing Threshold Amplitude: 0.75 V
Lead Channel Pacing Threshold Amplitude: 1.75 V
Lead Channel Pacing Threshold Amplitude: 1.75 V
Lead Channel Pacing Threshold Pulse Width: 0.5 ms
Lead Channel Pacing Threshold Pulse Width: 0.5 ms
Lead Channel Pacing Threshold Pulse Width: 0.5 ms
Lead Channel Pacing Threshold Pulse Width: 0.5 ms
Lead Channel Sensing Intrinsic Amplitude: 5 mV
Lead Channel Sensing Intrinsic Amplitude: 8.5 mV
Lead Channel Setting Pacing Amplitude: 1.625
Lead Channel Setting Pacing Amplitude: 1.875
Lead Channel Setting Pacing Pulse Width: 0.5 ms
Lead Channel Setting Sensing Sensitivity: 2 mV
Pulse Gen Model: 2210
Pulse Gen Serial Number: 7267950

## 2016-06-02 NOTE — Patient Instructions (Signed)
Medication Instructions:  Your physician recommends that you continue on your current medications as directed. Please refer to the Current Medication list given to you today.   Labwork: None ordered   Testing/Procedures: None ordered   Follow-Up: Your physician wants you to follow-up in: 12 months with Chanetta Marshall, NP You will receive a reminder letter in the mail two months in advance. If you don't receive a letter, please call our office to schedule the follow-up appointment.   Remote monitoring is used to monitor your Pacemaker from home. This monitoring reduces the number of office visits required to check your device to one time per year. It allows [place] to keep an eye on the functioning of your device to ensure it is working properly. You are scheduled for a device check from home on 09/01/16. You may send your transmission at any time that day. If you have a wireless device, the transmission will be sent automatically. After your physician reviews your transmission, you will receive a postcard with your next transmission date.    Any Other Special Instructions Will Be Listed Below (If Applicable).     If you need a refill on your cardiac medications before your next appointment, please call your pharmacy.

## 2016-06-02 NOTE — Progress Notes (Signed)
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 male who presents today for routine electrophysiology followup.  Since last being seen in our clinic, the patient reports doing very well.  He has chronic and stable exertional SOB. Today, he denies symptoms of palpitations, chest pain, dizziness, presyncope, or syncope.  His edema is unchanged.  The patient is otherwise without complaint today.   Past Medical History:  Diagnosis Date  . Bell's palsy   . Bifascicular block   . Chronic bronchitis ([place])    "seasonal; get it q yr"  . Chronic diastolic CHF (congestive heart failure) ([place]) 2010  . Chronic kidney disease (CKD), stage II (mild)    stage II to III/notes 07/23/2014  . Diastolic heart failure secondary to hypertrophic cardiomyopathy ([place]) CARDIOLOGIST-- DR Daneen Schick  . Edema 07/2013  . 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 Gout, arthritis 2014   bil feet. right worse  . History of cellulitis of skin with lymphangitis LEFT LEG  . HOCM (hypertrophic obstructive cardiomyopathy) ([place])    a. Echo 11/16: Severe LVH, EF 55-60%, abnormal GLS consistent with HOCM, no SAM, mild LAE  . Hypertension 20 years  . Lymphedema of lower extremity LEFT  >  RIGHT   "using ankle high socks at home; pump doesn't work for me" (07/23/2014)  . Pacemaker    02/12/11  . Pneumonia 08/2011  . Seasonal asthma NO INHALERS   30 years  . Second degree Mobitz II AV block    with syncope, s/p PPM  . Short of breath on exertion   . Type II diabetes mellitus ([place]) 2012   INSULIN DEPENDENT   Past Surgical History:  Procedure Laterality Date  . CARDIAC CATHETERIZATION  03-12-2011  DR Daneen Schick   HYPERTROPHIC CARDIOMYOPATHY WITH LV CAVITY  APPEARANCE CONSISTANT WITH SIGNIFICANT APICAL HYPERTROPHY/ NORMAL LVSF / EF 55%/ EVIDENCE OF DIASTOLIC DYSFUNCTION WITH EDP OF 23-73mmHg AFTER A-WAVE/ NORMAL CORONARY ARTERIES  . EVALUATION  UNDER ANESTHESIA WITH FISTULECTOMY N/A 06/23/2015   Procedure: EXAM UNDER ANESTHESIA WITH POSSIBLE FISTULOTOMY;  Surgeon: Judeth Horn, MD;  Location: [facility];  Service: General;  Laterality: N/A;  . HERNIA REPAIR  [id number]   Umbilical Hernia Repair  . INCISION AND DRAINAGE PERIRECTAL ABSCESS Left 07/29/2014   Procedure: IRRIGATION AND DEBRIDEMENT PERIRECTAL ABSCESS;  Surgeon: Doreen Salvage, MD;  Location: [facility];  Service: General;  Laterality: Left;  Prone position  . INSERT / REPLACE / REMOVE PACEMAKER  02/12/2011   SJM implanted by Dr Rayann Heman for Mobitz II second degree AV block and syncope  . IRRIGATION AND DEBRIDEMENT ABSCESS N/A 07/27/2013   Procedure: IRRIGATION AND DEBRIDEMENT OF SKIN, SOFT TISSUE AND MUSCLES OF UPPER BACK (11X19X4cm) WITH 10 BLADE AND PULSATILE LAVAGE ;  Surgeon: Gayland Curry, MD;  Location: [facility];  Service: General;  Laterality: N/A;  . UMBILICAL HERNIA REPAIR  03/22/2012   Procedure: HERNIA REPAIR UMBILICAL ADULT;  Surgeon: Leighton Ruff, MD;  Location: [facility];  Service: General;  Laterality: N/A;  Umbilical Hernia Repair     Current Outpatient Prescriptions  Medication Sig Dispense Refill  . ACCU-CHEK AVIVA PLUS test strip TEST 3 TIMES DAILY. ICD 10 E11.8 100 each 1  . ACCU-CHEK FASTCLIX LANCETS MISC 1 each by Other route 3 (three) times daily. E 11.59 - insulin dependent, needs testing for 3-4 times daily. 102 each 11  . acetaminophen (TYLENOL) 500 MG tablet  Take 500 mg by mouth every 14 (fourteen) days. Only taking 1 hour before infusion.  AS premed for Fabrazyme    . Agalsidase beta (FABRAZYME IV) Inject 115 mg into the vein every 14 (fourteen) days.     . ALBUTEROL SULFATE HFA IN Inhale 1 puff into the lungs daily as needed (shortness of breath or wheezing). For shortness of breath    . allopurinol (ZYLOPRIM) 300 MG tablet Take 1 tablet (300 mg total) by mouth daily. 30 tablet 5  . aspirin 81 MG tablet Take 81 mg by mouth daily.     [name] Kitchen eplerenone (INSPRA) 50 MG tablet Take 1  tablet (50 mg total) by mouth daily. 30 tablet 2  . fexofenadine (ALLEGRA) 180 MG tablet Take 180 mg by mouth every 14 (fourteen) days.     [name] Kitchen glucose blood (ACCU-CHEK AVIVA) test strip Use as instructed 100 each 12  . insulin aspart (NOVOLOG) 100 UNIT/ML injection Inject 0-15 Units into the skin 3 (three) times daily with meals. (Patient taking differently: Inject 0-15 Units into the skin 2 (two) times daily. ) 10 mL 11  . Insulin Lispro Prot & Lispro (HUMALOG 75/25 MIX) (75-25) 100 UNIT/ML Kwikpen Inject 20 Units into the skin 2 (two) times daily. 15 mL 11  . Insulin Pen Needle (PX SHORTLENGTH PEN NEEDLES) 31G X 8 MM MISC 1 each by Does not apply route 2 (two) times daily before a meal. 100 each 8  . losartan-hydrochlorothiazide (HYZAAR) 50-12.5 MG tablet Take 1 tablet by mouth daily.  6   No current facility-administered medications for this visit.    ROS- all systems are reviewed and negative except as per HPI above  Physical Exam: Vitals:   06/02/16 1427  BP: 112/80  Pulse: 77  SpO2: 98%  Weight: 287 lb 12.8 oz (130.5 kg)  Height: 5\' 10"  (1.778 m)    GEN- The patient is well appearing, alert and oriented x 3 today.   Head- normocephalic, atraumatic Eyes-  Sclera clear, conjunctiva pink Ears- hearing intact Oropharynx- clear Lungs- Clear to ausculation bilaterally, normal work of breathing Chest- pacemaker pocket is well healed Heart- Regular rate and rhythm, no murmurs, rubs or gallops, PMI not laterally displaced GI- soft, NT, ND, + BS Extremities- no clubbing, cyanosis, +2 stable edema  Pacemaker interrogation- reviewed in detail today,  See PACEART report  Assessment and Plan:  Mobitz (type) II atrioventricular block  Normal pacemaker function  See Pace Art report  Previously increased lower pacing rate to 70 bpm without improvement.  I have therefore returned to 60 bpm today.  Hypertension   Stable  No change required today   Dyspnea   He continues to have  chronic exertional SOB.  CPX has been ordered previously however he did not have the study Regular exercise and weight loss encouraged Body mass index is 41.3 kg/m.  [facility]  Return in 1 year to see EP NP  Thompson Grayer MD, [name] 06/02/2016 3:04 PM

## 2016-06-10 ENCOUNTER — Encounter (HOSPITAL_COMMUNITY): Payer: Self-pay

## 2016-06-10 ENCOUNTER — Encounter (HOSPITAL_COMMUNITY)
Admission: RE | Admit: 2016-06-10 | Discharge: 2016-06-10 | Disposition: A | Discharge status: Home / Self Care | Payer: Medicare Other | Source: Ambulatory Visit | Attending: Nephrology | Admitting: Nephrology

## 2016-06-10 DIAGNOSIS — E7521 Fabry (-Anderson) disease: Secondary | ICD-10-CM | POA: Insufficient documentation

## 2016-06-10 DIAGNOSIS — E1101 Type 2 diabetes mellitus with hyperosmolarity with coma: Secondary | ICD-10-CM | POA: Diagnosis not present

## 2016-06-10 LAB — URINALYSIS, ROUTINE W REFLEX MICROSCOPIC
Bacteria, UA: NONE SEEN
Bilirubin Urine: NEGATIVE
Glucose, UA: NEGATIVE mg/dL
Hgb urine dipstick: NEGATIVE
Ketones, ur: NEGATIVE mg/dL
Leukocytes, UA: NEGATIVE
Nitrite: NEGATIVE
Protein, ur: 100 mg/dL — AB
Specific Gravity, Urine: 1.009 (ref 1.005–1.030)
Squamous Epithelial / LPF: NONE SEEN
pH: 5 (ref 5.0–8.0)

## 2016-06-10 LAB — COMPREHENSIVE METABOLIC PANEL
ALT: 27 U/L (ref 17–63)
AST: 45 U/L — ABNORMAL HIGH (ref 15–41)
Albumin: 3.1 g/dL — ABNORMAL LOW (ref 3.5–5.0)
Alkaline Phosphatase: 68 U/L (ref 38–126)
Anion gap: 8 (ref 5–15)
BUN: 22 mg/dL — ABNORMAL HIGH (ref 6–20)
CO2: 25 mmol/L (ref 22–32)
Calcium: 9.1 mg/dL (ref 8.9–10.3)
Chloride: 108 mmol/L (ref 101–111)
Creatinine, Ser: 1.38 mg/dL — ABNORMAL HIGH (ref 0.61–1.24)
GFR calc Af Amer: >60 mL/min (ref >60)
GFR calc non Af Amer: 56 mL/min — ABNORMAL LOW (ref >60)
Glucose, Bld: 91 mg/dL (ref 65–99)
Potassium: 5.1 mmol/L (ref 3.5–5.1)
Sodium: 141 mmol/L (ref 135–145)
Total Bilirubin: 0.8 mg/dL (ref 0.3–1.2)
Total Protein: 7.7 g/dL (ref 6.5–8.1)

## 2016-06-10 LAB — PHOSPHORUS: Phosphorus: 4.7 mg/dL — ABNORMAL HIGH (ref 2.5–4.6)

## 2016-06-10 MED ORDER — SODIUM CHLORIDE 0.9 % IV SOLN
INTRAVENOUS | Status: DC
  Administered 2016-06-10: 07:00:00 via INTRAVENOUS

## 2016-06-10 MED ORDER — SODIUM CHLORIDE 0.9 % IV SOLN
115.0000 mg | INTRAVENOUS | Status: DC
  Administered 2016-06-10: 115 mg via INTRAVENOUS
  Filled 2016-06-10: qty 23

## 2016-06-25 ENCOUNTER — Encounter (HOSPITAL_COMMUNITY): Payer: Self-pay

## 2016-06-25 ENCOUNTER — Encounter (HOSPITAL_COMMUNITY)
Admission: RE | Admit: 2016-06-25 | Discharge: 2016-06-25 | Disposition: A | Discharge status: Home / Self Care | Payer: Medicare Other | Source: Ambulatory Visit | Attending: Nephrology | Admitting: Nephrology

## 2016-06-25 DIAGNOSIS — E1101 Type 2 diabetes mellitus with hyperosmolarity with coma: Secondary | ICD-10-CM | POA: Diagnosis not present

## 2016-06-25 DIAGNOSIS — E7521 Fabry (-Anderson) disease: Secondary | ICD-10-CM | POA: Diagnosis not present

## 2016-06-25 LAB — CBC WITH DIFFERENTIAL/PLATELET
Basophils Absolute: 0 10*3/uL (ref 0.0–0.1)
Basophils Relative: 0 %
Eosinophils Absolute: 0.2 10*3/uL (ref 0.0–0.7)
Eosinophils Relative: 3 %
HCT: 44.5 % (ref 39.0–52.0)
Hemoglobin: 14.8 g/dL (ref 13.0–17.0)
Lymphocytes Relative: 34 %
Lymphs Abs: 2.6 10*3/uL (ref 0.7–4.0)
MCH: 29.9 pg (ref 26.0–34.0)
MCHC: 33.3 g/dL (ref 30.0–36.0)
MCV: 89.9 fL (ref 78.0–100.0)
Monocytes Absolute: 0.8 10*3/uL (ref 0.1–1.0)
Monocytes Relative: 10 %
Neutro Abs: 4 10*3/uL (ref 1.7–7.7)
Neutrophils Relative %: 53 %
Platelets: 233 10*3/uL (ref 150–400)
RBC: 4.95 MIL/uL (ref 4.22–5.81)
RDW: 12.9 % (ref 11.5–15.5)
WBC: 7.7 10*3/uL (ref 4.0–10.5)

## 2016-06-25 LAB — FERRITIN: Ferritin: 139 ng/mL (ref 24–336)

## 2016-06-25 LAB — IRON AND TIBC
Iron: 67 ug/dL (ref 45–182)
Saturation Ratios: 25 % (ref 17.9–39.5)
TIBC: 269 ug/dL (ref 250–450)
UIBC: 202 ug/dL

## 2016-06-25 LAB — LIPID PANEL
Cholesterol: 238 mg/dL — ABNORMAL HIGH (ref 0–200)
HDL: 56 mg/dL (ref >40)
LDL Cholesterol: 162 mg/dL — ABNORMAL HIGH (ref 0–99)
Total CHOL/HDL Ratio: 4.3 RATIO
Triglycerides: 99 mg/dL (ref <150)
VLDL: 20 mg/dL (ref 0–40)

## 2016-06-25 MED ORDER — SODIUM CHLORIDE 0.9 % IV SOLN
115.0000 mg | INTRAVENOUS | Status: DC
  Administered 2016-06-25: 115 mg via INTRAVENOUS
  Filled 2016-06-25: qty 21

## 2016-06-25 MED ORDER — SODIUM CHLORIDE 0.9 % IV SOLN
INTRAVENOUS | Status: DC
  Administered 2016-06-25: 08:00:00 via INTRAVENOUS

## 2016-06-25 NOTE — Discharge Instructions (Signed)
Fabry Disease Introduction Fabry disease is a rare genetic disorder. A gene defect (mutation) alters a protein, called an enzyme, that normally breaks down a particular fat (globotriaosylceramide). The mutation keeps the enzyme from breaking down the fat. This causes excess fat to build up in the blood vessels (vasculature) of many organs of the body. Over time, this results in less blood flow and fewer nutrients getting to the body's organs. The buildup can also wear away at nerve fibers. That causes pain and other symptoms. The areas most often affected are the hands, feet, skin, eyes, and digestive system. Eventually the fatty deposits can affect your cardiovascular system, nervous system, and kidneys. This can cause a heart attack, stroke, kidney damage, or other life-threatening complications. What are the causes? Fabry disease is caused by a genetic mutation. This mutation is passed down through families (inherited). What increases the risk? You may be more likely to have Fabry disease if:  You are male.  You have a family history of the disease. What are the signs or symptoms? Symptoms of Fabry disease include:  A rash of small, raised, reddish-purple dots on the skin (angiokeratomas).  Cloudy eyes or other eye problems.  Chronic pain, especially in the hands or feet.  Burning sensations in the hands and feet.  Gastrointestinal problems, such as bloating, diarrhea, and frequent bowel movements.  Ringing in the ears (tinnitus).  Hearing loss.  Decreased sweat production (hypohidrosis) or no sweat production (anhidrosis). Over time, untreated Fabry disease can cause:  Severe back pain in the area of the kidneys.  Heart attack.  Stroke. How is this diagnosed? Your health care provider may suspect Fabry disease based on your symptoms and family history. Your health care provider will also perform a physical exam. This may include tests, such as:  A blood test to measure  enzyme activity in your blood (enzyme assay).  A blood test to look for mutations in the gene that causes Fabry disease.  Urine tests to check how well your kidneys are working and to check for protein in your urine.  An ultrasound of your heart (echocardiogram) to create an image of your heart and check for heart disease. If you or your partner has Fabry disease and you are having a child, your child can be screened for the condition at birth. If the results of that test are out of the normal range, the child may need follow-up blood and urine tests. These would help confirm the diagnosis and help determine treatment. Prenatal testing for Fabry disease also can be done. How is this treated? Medicines are used to manage Fabry disease. This may include:  Enzyme replacement therapy. This medicine performs the functions of the enzyme that is missing in Fabry disease.  Medicines to treat the pain and discomfort in your hands and feet. You may also need treatment for complications caused by Fabry Disease. These complications may be related to your kidneys or heart. Treatments may include:  Angioplasty to repair or unblock an artery.  Heart surgery.  Dialysis to filter your kidneys.  Kidney transplant. Follow these instructions at home:  Avoid stressful situations when possible. Stress can worsen the pain related to Fabry disease.  Stay indoors when the temperatures are very hot or very cold.  Exercise carefully, as directed by your health care provider. Physical exertion can make Fabry disease worse.  Practice good hygiene to avoid illnesses that make Fabry disease worse.  Have a good support system in place. Contact a health  care provider if:  You have worsening pain.  You have worsening vision.  You have a noticeable decrease in urination.  You have swelling of your hands, feet, or lower legs that is getting worse.  You feel increasingly weak or tired. Get help right away  if:  You have trouble breathing, chest pain, or a feeling of pressure in your chest.  You have stroke symptoms. These include:  Numbness or weakness on one side of your body.  Droopiness on one side of your face.  Slurred speech.  Confusion.  You have sharp pain in the area of your kidneys. This information is not intended to replace advice given to you by your health care provider. Make sure you discuss any questions you have with your health care provider. Document Released: 05/14/2002 Document Revised: 10/30/2015 Document Reviewed: 01/16/2014  2017 Elsevier

## 2016-07-09 ENCOUNTER — Encounter (HOSPITAL_COMMUNITY): Payer: Self-pay

## 2016-07-09 ENCOUNTER — Encounter (HOSPITAL_COMMUNITY)
Admission: RE | Admit: 2016-07-09 | Discharge: 2016-07-09 | Disposition: A | Discharge status: Home / Self Care | Payer: Medicare Other | Source: Ambulatory Visit | Attending: Nephrology | Admitting: Nephrology

## 2016-07-09 DIAGNOSIS — E7521 Fabry (-Anderson) disease: Secondary | ICD-10-CM | POA: Diagnosis not present

## 2016-07-09 DIAGNOSIS — E1101 Type 2 diabetes mellitus with hyperosmolarity with coma: Secondary | ICD-10-CM | POA: Diagnosis not present

## 2016-07-09 LAB — COMPREHENSIVE METABOLIC PANEL
ALT: 20 U/L (ref 17–63)
AST: 32 U/L (ref 15–41)
Albumin: 3 g/dL — ABNORMAL LOW (ref 3.5–5.0)
Alkaline Phosphatase: 72 U/L (ref 38–126)
Anion gap: 9 (ref 5–15)
BUN: 21 mg/dL — ABNORMAL HIGH (ref 6–20)
CO2: 25 mmol/L (ref 22–32)
Calcium: 8.7 mg/dL — ABNORMAL LOW (ref 8.9–10.3)
Chloride: 104 mmol/L (ref 101–111)
Creatinine, Ser: 1.49 mg/dL — ABNORMAL HIGH (ref 0.61–1.24)
GFR calc Af Amer: 59 mL/min — ABNORMAL LOW (ref >60)
GFR calc non Af Amer: 51 mL/min — ABNORMAL LOW (ref >60)
Glucose, Bld: 207 mg/dL — ABNORMAL HIGH (ref 65–99)
Potassium: 4.6 mmol/L (ref 3.5–5.1)
Sodium: 138 mmol/L (ref 135–145)
Total Bilirubin: 1 mg/dL (ref 0.3–1.2)
Total Protein: 7.2 g/dL (ref 6.5–8.1)

## 2016-07-09 LAB — URINALYSIS, ROUTINE W REFLEX MICROSCOPIC
Bilirubin Urine: NEGATIVE
Glucose, UA: 50 mg/dL — AB
Hgb urine dipstick: NEGATIVE
Ketones, ur: NEGATIVE mg/dL
Nitrite: NEGATIVE
Protein, ur: 100 mg/dL — AB
Specific Gravity, Urine: 1.01 (ref 1.005–1.030)
pH: 5 (ref 5.0–8.0)

## 2016-07-09 LAB — PHOSPHORUS: Phosphorus: 3.8 mg/dL (ref 2.5–4.6)

## 2016-07-09 MED ORDER — SODIUM CHLORIDE 0.9 % IV SOLN
115.0000 mg | INTRAVENOUS | Status: DC
  Administered 2016-07-09: 115 mg via INTRAVENOUS
  Filled 2016-07-09: qty 21

## 2016-07-09 MED ORDER — SODIUM CHLORIDE 0.9 % IV SOLN
INTRAVENOUS | Status: DC
  Administered 2016-07-09: 07:00:00 via INTRAVENOUS

## 2016-07-09 NOTE — Discharge Instructions (Signed)
Fabry Disease Introduction Fabry disease is a rare genetic disorder. A gene defect (mutation) alters a protein, called an enzyme, that normally breaks down a particular fat (globotriaosylceramide). The mutation keeps the enzyme from breaking down the fat. This causes excess fat to build up in the blood vessels (vasculature) of many organs of the body. Over time, this results in less blood flow and fewer nutrients getting to the body's organs. The buildup can also wear away at nerve fibers. That causes pain and other symptoms. The areas most often affected are the hands, feet, skin, eyes, and digestive system. Eventually the fatty deposits can affect your cardiovascular system, nervous system, and kidneys. This can cause a heart attack, stroke, kidney damage, or other life-threatening complications. What are the causes? Fabry disease is caused by a genetic mutation. This mutation is passed down through families (inherited). What increases the risk? You may be more likely to have Fabry disease if:  You are male.  You have a family history of the disease. What are the signs or symptoms? Symptoms of Fabry disease include:  A rash of small, raised, reddish-purple dots on the skin (angiokeratomas).  Cloudy eyes or other eye problems.  Chronic pain, especially in the hands or feet.  Burning sensations in the hands and feet.  Gastrointestinal problems, such as bloating, diarrhea, and frequent bowel movements.  Ringing in the ears (tinnitus).  Hearing loss.  Decreased sweat production (hypohidrosis) or no sweat production (anhidrosis). Over time, untreated Fabry disease can cause:  Severe back pain in the area of the kidneys.  Heart attack.  Stroke. How is this diagnosed? Your health care provider may suspect Fabry disease based on your symptoms and family history. Your health care provider will also perform a physical exam. This may include tests, such as:  A blood test to measure  enzyme activity in your blood (enzyme assay).  A blood test to look for mutations in the gene that causes Fabry disease.  Urine tests to check how well your kidneys are working and to check for protein in your urine.  An ultrasound of your heart (echocardiogram) to create an image of your heart and check for heart disease. If you or your partner has Fabry disease and you are having a child, your child can be screened for the condition at birth. If the results of that test are out of the normal range, the child may need follow-up blood and urine tests. These would help confirm the diagnosis and help determine treatment. Prenatal testing for Fabry disease also can be done. How is this treated? Medicines are used to manage Fabry disease. This may include:  Enzyme replacement therapy. This medicine performs the functions of the enzyme that is missing in Fabry disease.  Medicines to treat the pain and discomfort in your hands and feet. You may also need treatment for complications caused by Fabry Disease. These complications may be related to your kidneys or heart. Treatments may include:  Angioplasty to repair or unblock an artery.  Heart surgery.  Dialysis to filter your kidneys.  Kidney transplant. Follow these instructions at home:  Avoid stressful situations when possible. Stress can worsen the pain related to Fabry disease.  Stay indoors when the temperatures are very hot or very cold.  Exercise carefully, as directed by your health care provider. Physical exertion can make Fabry disease worse.  Practice good hygiene to avoid illnesses that make Fabry disease worse.  Have a good support system in place. Contact a health  care provider if:  You have worsening pain.  You have worsening vision.  You have a noticeable decrease in urination.  You have swelling of your hands, feet, or lower legs that is getting worse.  You feel increasingly weak or tired. Get help right away  if:  You have trouble breathing, chest pain, or a feeling of pressure in your chest.  You have stroke symptoms. These include:  Numbness or weakness on one side of your body.  Droopiness on one side of your face.  Slurred speech.  Confusion.  You have sharp pain in the area of your kidneys. This information is not intended to replace advice given to you by your health care provider. Make sure you discuss any questions you have with your health care provider. Document Released: 05/14/2002 Document Revised: 10/30/2015 Document Reviewed: 01/16/2014  2017 Elsevier

## 2016-07-17 ENCOUNTER — Other Ambulatory Visit: Payer: Self-pay | Admitting: Family Medicine

## 2016-07-17 DIAGNOSIS — M109 Gout, unspecified: Secondary | ICD-10-CM

## 2016-07-23 ENCOUNTER — Encounter (HOSPITAL_COMMUNITY): Admission: RE | Admit: 2016-07-23 | Payer: Medicare Other | Source: Ambulatory Visit

## 2016-07-27 ENCOUNTER — Other Ambulatory Visit: Payer: Self-pay | Admitting: Family Medicine

## 2016-07-27 DIAGNOSIS — E1165 Type 2 diabetes mellitus with hyperglycemia: Secondary | ICD-10-CM

## 2016-07-27 DIAGNOSIS — E118 Type 2 diabetes mellitus with unspecified complications: Principal | ICD-10-CM

## 2016-08-02 DIAGNOSIS — E7521 Fabry (-Anderson) disease: Secondary | ICD-10-CM | POA: Diagnosis not present

## 2016-08-02 DIAGNOSIS — N182 Chronic kidney disease, stage 2 (mild): Secondary | ICD-10-CM | POA: Diagnosis not present

## 2016-08-02 DIAGNOSIS — E118 Type 2 diabetes mellitus with unspecified complications: Secondary | ICD-10-CM | POA: Diagnosis not present

## 2016-08-02 DIAGNOSIS — I890 Lymphedema, not elsewhere classified: Secondary | ICD-10-CM | POA: Diagnosis not present

## 2016-08-02 DIAGNOSIS — I129 Hypertensive chronic kidney disease with stage 1 through stage 4 chronic kidney disease, or unspecified chronic kidney disease: Secondary | ICD-10-CM | POA: Diagnosis not present

## 2016-08-06 ENCOUNTER — Encounter (HOSPITAL_COMMUNITY): Payer: Self-pay

## 2016-08-06 ENCOUNTER — Encounter (HOSPITAL_COMMUNITY)
Admission: RE | Admit: 2016-08-06 | Discharge: 2016-08-06 | Disposition: A | Discharge status: Home / Self Care | Payer: Medicare Other | Source: Ambulatory Visit | Attending: Nephrology | Admitting: Nephrology

## 2016-08-06 DIAGNOSIS — E1101 Type 2 diabetes mellitus with hyperosmolarity with coma: Secondary | ICD-10-CM | POA: Diagnosis not present

## 2016-08-06 DIAGNOSIS — E7521 Fabry (-Anderson) disease: Secondary | ICD-10-CM | POA: Diagnosis not present

## 2016-08-06 LAB — URINALYSIS, ROUTINE W REFLEX MICROSCOPIC
Bilirubin Urine: NEGATIVE
Glucose, UA: NEGATIVE mg/dL
Hgb urine dipstick: NEGATIVE
Ketones, ur: NEGATIVE mg/dL
Nitrite: POSITIVE — AB
Protein, ur: 100 mg/dL — AB
Specific Gravity, Urine: 1.01 (ref 1.005–1.030)
pH: 5 (ref 5.0–8.0)

## 2016-08-06 LAB — COMPREHENSIVE METABOLIC PANEL
ALT: 20 U/L (ref 17–63)
AST: 33 U/L (ref 15–41)
Albumin: 2.9 g/dL — ABNORMAL LOW (ref 3.5–5.0)
Alkaline Phosphatase: 62 U/L (ref 38–126)
Anion gap: 6 (ref 5–15)
BUN: 14 mg/dL (ref 6–20)
CO2: 25 mmol/L (ref 22–32)
Calcium: 8.5 mg/dL — ABNORMAL LOW (ref 8.9–10.3)
Chloride: 108 mmol/L (ref 101–111)
Creatinine, Ser: 1.51 mg/dL — ABNORMAL HIGH (ref 0.61–1.24)
GFR calc Af Amer: 58 mL/min — ABNORMAL LOW (ref >60)
GFR calc non Af Amer: 50 mL/min — ABNORMAL LOW (ref >60)
Glucose, Bld: 212 mg/dL — ABNORMAL HIGH (ref 65–99)
Potassium: 4.5 mmol/L (ref 3.5–5.1)
Sodium: 139 mmol/L (ref 135–145)
Total Bilirubin: 0.8 mg/dL (ref 0.3–1.2)
Total Protein: 7 g/dL (ref 6.5–8.1)

## 2016-08-06 LAB — PHOSPHORUS: Phosphorus: 3.2 mg/dL (ref 2.5–4.6)

## 2016-08-06 MED ORDER — SODIUM CHLORIDE 0.9 % IV SOLN
115.0000 mg | INTRAVENOUS | Status: DC
  Administered 2016-08-06: 115 mg via INTRAVENOUS
  Filled 2016-08-06: qty 21

## 2016-08-06 MED ORDER — SODIUM CHLORIDE 0.9 % IV SOLN
INTRAVENOUS | Status: DC
  Administered 2016-08-06: 11:00:00 via INTRAVENOUS

## 2016-08-06 NOTE — Discharge Instructions (Signed)
Agalsidase Beta injection What is this medicine? AGALSIDASE BETA is used to replace an enzyme that is missing in patients with Fabry disease. It is not a cure. This medicine may be used for other purposes; ask your health care provider or pharmacist if you have questions. COMMON BRAND NAME(S): Fabrazyme What should I tell my health care provider before I take this medicine? They need to know if you have any of these conditions: -heart disease -an unusual or allergic reaction to agalsidase beta, mannitol, other medicines, foods, dyes, or preservatives -pregnant or trying to get pregnant -breast-feeding How should I use this medicine? This medicine is for infusion into a vein. It is given by a health care professional in a hospital or clinic setting. Talk to your pediatrician regarding the use of this medicine in children. Special care may be needed. Overdosage: If you think you have taken too much of this medicine contact a poison control center or emergency room at once. NOTE: This medicine is only for you. Do not share this medicine with others. What if I miss a dose? It is important not to miss your dose. Call your doctor or health care professional if you are unable to keep an appointment. What may interact with this medicine? -amiodarone -chloroquine -gentamicin -hydroxychloroquine -monobenzone This list may not describe all possible interactions. Give your health care provider a list of all the medicines, herbs, non-prescription drugs, or dietary supplements you use. Also tell them if you smoke, drink alcohol, or use illegal drugs. Some items may interact with your medicine. What should I watch for while using this medicine? Visit your doctor or health care professional for regular checks on your progress. Tell your doctor or healthcare professional if your symptoms do not start to get better or if they get worse. There is a registry for patients with Fabry disease. The registry is  used to gather information about the disease and its effects. Talk to your health care provider if you would like to join the registry. What side effects may I notice from receiving this medicine? Side effects that you should report to your doctor or health care professional as soon as possible: -allergic reactions like skin rash, itching or hives, swelling of the face, lips, or tongue -breathing problems -chest pain, tightness -depression -dizziness -fast, irregular heart beat -swelling of the arms or legs Side effects that usually do not require medical attention (report to your doctor or health care professional if they continue or are bothersome): -aches or pains -anxiety -fever or chills at the time of injection -headache -nausea, vomiting -stomach pain, upset This list may not describe all possible side effects. Call your doctor for medical advice about side effects. You may report side effects to FDA at 1-800-FDA-1088. Where should I keep my medicine? This drug is given in a hospital or clinic and will not be stored at home. NOTE: This sheet is a summary. It may not cover all possible information. If you have questions about this medicine, talk to your doctor, pharmacist, or health care provider.  2018 Elsevier/Gold Standard (2006-01-31 12:25:00)  

## 2016-08-15 ENCOUNTER — Other Ambulatory Visit: Payer: Self-pay | Admitting: Internal Medicine

## 2016-08-15 DIAGNOSIS — M109 Gout, unspecified: Secondary | ICD-10-CM

## 2016-08-20 ENCOUNTER — Telehealth: Payer: Self-pay

## 2016-08-20 ENCOUNTER — Encounter (HOSPITAL_COMMUNITY): Payer: Self-pay

## 2016-08-20 ENCOUNTER — Encounter (HOSPITAL_COMMUNITY)
Admission: RE | Admit: 2016-08-20 | Discharge: 2016-08-20 | Disposition: A | Discharge status: Home / Self Care | Payer: Medicare Other | Source: Ambulatory Visit | Attending: Nephrology | Admitting: Nephrology

## 2016-08-20 DIAGNOSIS — E7521 Fabry (-Anderson) disease: Secondary | ICD-10-CM | POA: Diagnosis not present

## 2016-08-20 DIAGNOSIS — E1101 Type 2 diabetes mellitus with hyperosmolarity with coma: Secondary | ICD-10-CM | POA: Diagnosis not present

## 2016-08-20 HISTORY — DX: Dyspnea, unspecified: R06.00

## 2016-08-20 MED ORDER — SODIUM CHLORIDE 0.9 % IV SOLN
INTRAVENOUS | Status: DC
  Administered 2016-08-20: 08:00:00 via INTRAVENOUS

## 2016-08-20 MED ORDER — SODIUM CHLORIDE 0.9 % IV SOLN
115.0000 mg | INTRAVENOUS | Status: DC
  Administered 2016-08-20: 115 mg via INTRAVENOUS
  Filled 2016-08-20: qty 21

## 2016-08-20 NOTE — Discharge Instructions (Signed)
Agalsidase Beta injection/Fabrzyme Infusion  What is this medicine? AGALSIDASE BETA is used to replace an enzyme that is missing in patients with Fabry disease. It is not a cure. This medicine may be used for other purposes; ask your health care provider or pharmacist if you have questions. COMMON BRAND NAME(S): Fabrazyme What should I tell my health care provider before I take this medicine? They need to know if you have any of these conditions: -heart disease -an unusual or allergic reaction to agalsidase beta, mannitol, other medicines, foods, dyes, or preservatives -pregnant or trying to get pregnant -breast-feeding How should I use this medicine? This medicine is for infusion into a vein. It is given by a health care professional in a hospital or clinic setting. Talk to your pediatrician regarding the use of this medicine in children. Special care may be needed. Overdosage: If you think you have taken too much of this medicine contact a poison control center or emergency room at once. NOTE: This medicine is only for you. Do not share this medicine with others. What if I miss a dose? It is important not to miss your dose. Call your doctor or health care professional if you are unable to keep an appointment. What may interact with this medicine? -amiodarone -chloroquine -gentamicin -hydroxychloroquine -monobenzone This list may not describe all possible interactions. Give your health care provider a list of all the medicines, herbs, non-prescription drugs, or dietary supplements you use. Also tell them if you smoke, drink alcohol, or use illegal drugs. Some items may interact with your medicine. What should I watch for while using this medicine? Visit your doctor or health care professional for regular checks on your progress. Tell your doctor or healthcare professional if your symptoms do not start to get better or if they get worse. There is a registry for patients with Fabry disease.  The registry is used to gather information about the disease and its effects. Talk to your health care provider if you would like to join the registry. What side effects may I notice from receiving this medicine? Side effects that you should report to your doctor or health care professional as soon as possible: -allergic reactions like skin rash, itching or hives, swelling of the face, lips, or tongue -breathing problems -chest pain, tightness -depression -dizziness -fast, irregular heart beat -swelling of the arms or legs Side effects that usually do not require medical attention (report to your doctor or health care professional if they continue or are bothersome): -aches or pains -anxiety -fever or chills at the time of injection -headache -nausea, vomiting -stomach pain, upset This list may not describe all possible side effects. Call your doctor for medical advice about side effects. You may report side effects to FDA at 1-800-FDA-1088. Where should I keep my medicine? This drug is given in a hospital or clinic and will not be stored at home. NOTE: This sheet is a summary. It may not cover all possible information. If you have questions about this medicine, talk to your doctor, pharmacist, or health care provider.  2018 Elsevier/Gold Standard (2006-01-31 12:25:00)

## 2016-08-20 NOTE — Telephone Encounter (Signed)
LVM to discuss pt home monitor update.

## 2016-09-01 ENCOUNTER — Telehealth: Payer: Self-pay | Admitting: Cardiology

## 2016-09-01 ENCOUNTER — Encounter: Payer: Medicare Other | Admitting: *Deleted

## 2016-09-01 NOTE — Telephone Encounter (Signed)
LMOVM reminding pt to send remote transmission.   

## 2016-09-03 ENCOUNTER — Encounter (HOSPITAL_COMMUNITY)
Admission: RE | Admit: 2016-09-03 | Discharge: 2016-09-03 | Disposition: A | Discharge status: Home / Self Care | Payer: Medicare Other | Source: Ambulatory Visit | Attending: Nephrology | Admitting: Nephrology

## 2016-09-03 ENCOUNTER — Encounter: Payer: Self-pay | Admitting: Cardiology

## 2016-09-03 ENCOUNTER — Encounter (HOSPITAL_COMMUNITY): Payer: Self-pay

## 2016-09-03 DIAGNOSIS — E7521 Fabry (-Anderson) disease: Secondary | ICD-10-CM | POA: Diagnosis not present

## 2016-09-03 DIAGNOSIS — E1101 Type 2 diabetes mellitus with hyperosmolarity with coma: Secondary | ICD-10-CM | POA: Diagnosis not present

## 2016-09-03 LAB — URINALYSIS, ROUTINE W REFLEX MICROSCOPIC
Bilirubin Urine: NEGATIVE
Glucose, UA: NEGATIVE mg/dL
Ketones, ur: NEGATIVE mg/dL
Nitrite: POSITIVE — AB
Protein, ur: 100 mg/dL — AB
Specific Gravity, Urine: 1.011 (ref 1.005–1.030)
Squamous Epithelial / LPF: NONE SEEN
pH: 5 (ref 5.0–8.0)

## 2016-09-03 LAB — COMPREHENSIVE METABOLIC PANEL
ALT: 27 U/L (ref 17–63)
AST: 28 U/L (ref 15–41)
Albumin: 3 g/dL — ABNORMAL LOW (ref 3.5–5.0)
Alkaline Phosphatase: 68 U/L (ref 38–126)
Anion gap: 7 (ref 5–15)
BUN: 19 mg/dL (ref 6–20)
CO2: 26 mmol/L (ref 22–32)
Calcium: 8.7 mg/dL — ABNORMAL LOW (ref 8.9–10.3)
Chloride: 108 mmol/L (ref 101–111)
Creatinine, Ser: 1.52 mg/dL — ABNORMAL HIGH (ref 0.61–1.24)
GFR calc Af Amer: 58 mL/min — ABNORMAL LOW (ref >60)
GFR calc non Af Amer: 50 mL/min — ABNORMAL LOW (ref >60)
Glucose, Bld: 146 mg/dL — ABNORMAL HIGH (ref 65–99)
Potassium: 3.6 mmol/L (ref 3.5–5.1)
Sodium: 141 mmol/L (ref 135–145)
Total Bilirubin: 0.5 mg/dL (ref 0.3–1.2)
Total Protein: 7.2 g/dL (ref 6.5–8.1)

## 2016-09-03 LAB — PHOSPHORUS: Phosphorus: 4 mg/dL (ref 2.5–4.6)

## 2016-09-03 MED ORDER — SODIUM CHLORIDE 0.9 % IV SOLN
INTRAVENOUS | Status: DC
  Administered 2016-09-03: 08:00:00 via INTRAVENOUS

## 2016-09-03 MED ORDER — SODIUM CHLORIDE 0.9 % IV SOLN
115.0000 mg | INTRAVENOUS | Status: DC
  Administered 2016-09-03: 115 mg via INTRAVENOUS
  Filled 2016-09-03: qty 21

## 2016-09-03 NOTE — Discharge Instructions (Signed)
Agalsidase Beta injection What is this medicine? AGALSIDASE BETA is used to replace an enzyme that is missing in patients with Fabry disease. It is not a cure. This medicine may be used for other purposes; ask your health care provider or pharmacist if you have questions. COMMON BRAND NAME(S): Fabrazyme What should I tell my health care provider before I take this medicine? They need to know if you have any of these conditions: -heart disease -an unusual or allergic reaction to agalsidase beta, mannitol, other medicines, foods, dyes, or preservatives -pregnant or trying to get pregnant -breast-feeding How should I use this medicine? This medicine is for infusion into a vein. It is given by a health care professional in a hospital or clinic setting. Talk to your pediatrician regarding the use of this medicine in children. Special care may be needed. Overdosage: If you think you have taken too much of this medicine contact a poison control center or emergency room at once. NOTE: This medicine is only for you. Do not share this medicine with others. What if I miss a dose? It is important not to miss your dose. Call your doctor or health care professional if you are unable to keep an appointment. What may interact with this medicine? -amiodarone -chloroquine -gentamicin -hydroxychloroquine -monobenzone This list may not describe all possible interactions. Give your health care provider a list of all the medicines, herbs, non-prescription drugs, or dietary supplements you use. Also tell them if you smoke, drink alcohol, or use illegal drugs. Some items may interact with your medicine. What should I watch for while using this medicine? Visit your doctor or health care professional for regular checks on your progress. Tell your doctor or healthcare professional if your symptoms do not start to get better or if they get worse. There is a registry for patients with Fabry disease. The registry is  used to gather information about the disease and its effects. Talk to your health care provider if you would like to join the registry. What side effects may I notice from receiving this medicine? Side effects that you should report to your doctor or health care professional as soon as possible: -allergic reactions like skin rash, itching or hives, swelling of the face, lips, or tongue -breathing problems -chest pain, tightness -depression -dizziness -fast, irregular heart beat -swelling of the arms or legs Side effects that usually do not require medical attention (report to your doctor or health care professional if they continue or are bothersome): -aches or pains -anxiety -fever or chills at the time of injection -headache -nausea, vomiting -stomach pain, upset This list may not describe all possible side effects. Call your doctor for medical advice about side effects. You may report side effects to FDA at 1-800-FDA-1088. Where should I keep my medicine? This drug is given in a hospital or clinic and will not be stored at home. NOTE: This sheet is a summary. It may not cover all possible information. If you have questions about this medicine, talk to your doctor, pharmacist, or health care provider.  2018 Elsevier/Gold Standard (2006-01-31 12:25:00)  

## 2016-09-14 ENCOUNTER — Ambulatory Visit (INDEPENDENT_AMBULATORY_CARE_PROVIDER_SITE_OTHER): Payer: Medicare Other | Admitting: *Deleted

## 2016-09-14 DIAGNOSIS — I441 Atrioventricular block, second degree: Secondary | ICD-10-CM | POA: Diagnosis not present

## 2016-09-15 LAB — CUP PACEART INCLINIC DEVICE CHECK
Battery Remaining Longevity: 57 mo
Battery Remaining Percentage: 57 %
Battery Voltage: 2.89 V
Brady Statistic RA Percent Paced: 79 %
Brady Statistic RV Percent Paced: 99 %
Date Time Interrogation Session: 20180411175232
Implantable Lead Implant Date: 20120907
Implantable Lead Implant Date: 20120907
Implantable Lead Location: 753859
Implantable Lead Location: 753860
Implantable Pulse Generator Implant Date: 20120907
Lead Channel Impedance Value: 350 Ohm
Lead Channel Impedance Value: 460 Ohm
Lead Channel Pacing Threshold Amplitude: 0.5 V
Lead Channel Pacing Threshold Amplitude: 1.5 V
Lead Channel Pacing Threshold Pulse Width: 0.5 ms
Lead Channel Pacing Threshold Pulse Width: 0.5 ms
Lead Channel Sensing Intrinsic Amplitude: 5 mV
Lead Channel Setting Pacing Amplitude: 1.5 V
Lead Channel Setting Pacing Amplitude: 1.75 V
Lead Channel Setting Pacing Pulse Width: 0.5 ms
Lead Channel Setting Sensing Sensitivity: 2 mV
Pulse Gen Model: 2210
Pulse Gen Serial Number: 7267950

## 2016-09-15 NOTE — Progress Notes (Signed)
Pacemaker check in clinic. Normal device function. Thresholds, sensing, impedances consistent with previous measurements. Device programmed to maximize longevity. <1% AT/AF burden, max dur. 74mins--sensor. (4) high ventricular rates noted--last 10/06/15. Device programmed at appropriate safety margins. Histogram distribution appropriate for patient activity level. Device programmed to optimize intrinsic conduction. Estimated longevity 4.9-5.4 years. Patient will follow up via [facility] on 7/10 and with AS in 05-2017.

## 2016-09-17 ENCOUNTER — Encounter (HOSPITAL_COMMUNITY)
Admission: RE | Admit: 2016-09-17 | Discharge: 2016-09-17 | Disposition: A | Discharge status: Home / Self Care | Payer: Medicare Other | Source: Ambulatory Visit | Attending: Nephrology | Admitting: Nephrology

## 2016-09-17 ENCOUNTER — Encounter (HOSPITAL_COMMUNITY): Payer: Self-pay

## 2016-09-17 DIAGNOSIS — E7521 Fabry (-Anderson) disease: Secondary | ICD-10-CM | POA: Diagnosis not present

## 2016-09-17 DIAGNOSIS — E1101 Type 2 diabetes mellitus with hyperosmolarity with coma: Secondary | ICD-10-CM | POA: Diagnosis not present

## 2016-09-17 MED ORDER — SODIUM CHLORIDE 0.9 % IV SOLN
115.0000 mg | INTRAVENOUS | Status: DC
  Administered 2016-09-17: 115 mg via INTRAVENOUS
  Filled 2016-09-17: qty 23

## 2016-09-17 MED ORDER — SODIUM CHLORIDE 0.9 % IV SOLN
INTRAVENOUS | Status: DC
  Administered 2016-09-17: 07:00:00 via INTRAVENOUS

## 2016-10-01 ENCOUNTER — Encounter (HOSPITAL_COMMUNITY): Payer: Self-pay

## 2016-10-01 ENCOUNTER — Encounter (HOSPITAL_COMMUNITY)
Admission: RE | Admit: 2016-10-01 | Discharge: 2016-10-01 | Disposition: A | Discharge status: Home / Self Care | Payer: Medicare Other | Source: Ambulatory Visit | Attending: Nephrology | Admitting: Nephrology

## 2016-10-01 DIAGNOSIS — E7521 Fabry (-Anderson) disease: Secondary | ICD-10-CM | POA: Diagnosis not present

## 2016-10-01 DIAGNOSIS — E1101 Type 2 diabetes mellitus with hyperosmolarity with coma: Secondary | ICD-10-CM | POA: Diagnosis not present

## 2016-10-01 LAB — LIPID PANEL
Cholesterol: 236 mg/dL — ABNORMAL HIGH (ref 0–200)
HDL: 47 mg/dL (ref >40)
LDL Cholesterol: 165 mg/dL — ABNORMAL HIGH (ref 0–99)
Total CHOL/HDL Ratio: 5 RATIO
Triglycerides: 122 mg/dL (ref <150)
VLDL: 24 mg/dL (ref 0–40)

## 2016-10-01 LAB — PHOSPHORUS: Phosphorus: 4.6 mg/dL (ref 2.5–4.6)

## 2016-10-01 LAB — URINALYSIS, ROUTINE W REFLEX MICROSCOPIC
Bacteria, UA: NONE SEEN
Bilirubin Urine: NEGATIVE
Glucose, UA: NEGATIVE mg/dL
Hgb urine dipstick: NEGATIVE
Ketones, ur: NEGATIVE mg/dL
Nitrite: NEGATIVE
Protein, ur: 100 mg/dL — AB
Specific Gravity, Urine: 1.009 (ref 1.005–1.030)
pH: 5 (ref 5.0–8.0)

## 2016-10-01 LAB — COMPREHENSIVE METABOLIC PANEL
ALT: 19 U/L (ref 17–63)
AST: 27 U/L (ref 15–41)
Albumin: 3.1 g/dL — ABNORMAL LOW (ref 3.5–5.0)
Alkaline Phosphatase: 61 U/L (ref 38–126)
Anion gap: 8 (ref 5–15)
BUN: 26 mg/dL — ABNORMAL HIGH (ref 6–20)
CO2: 24 mmol/L (ref 22–32)
Calcium: 8.9 mg/dL (ref 8.9–10.3)
Chloride: 108 mmol/L (ref 101–111)
Creatinine, Ser: 1.63 mg/dL — ABNORMAL HIGH (ref 0.61–1.24)
GFR calc Af Amer: 53 mL/min — ABNORMAL LOW (ref >60)
GFR calc non Af Amer: 46 mL/min — ABNORMAL LOW (ref >60)
Glucose, Bld: 151 mg/dL — ABNORMAL HIGH (ref 65–99)
Potassium: 4 mmol/L (ref 3.5–5.1)
Sodium: 140 mmol/L (ref 135–145)
Total Bilirubin: 0.3 mg/dL (ref 0.3–1.2)
Total Protein: 7.3 g/dL (ref 6.5–8.1)

## 2016-10-01 LAB — CBC WITH DIFFERENTIAL/PLATELET
Basophils Absolute: 0 10*3/uL (ref 0.0–0.1)
Basophils Relative: 0 %
Eosinophils Absolute: 0.3 10*3/uL (ref 0.0–0.7)
Eosinophils Relative: 4 %
HCT: 44.9 % (ref 39.0–52.0)
Hemoglobin: 15.2 g/dL (ref 13.0–17.0)
Lymphocytes Relative: 48 %
Lymphs Abs: 3.6 10*3/uL (ref 0.7–4.0)
MCH: 30.2 pg (ref 26.0–34.0)
MCHC: 33.9 g/dL (ref 30.0–36.0)
MCV: 89.3 fL (ref 78.0–100.0)
Monocytes Absolute: 0.6 10*3/uL (ref 0.1–1.0)
Monocytes Relative: 8 %
Neutro Abs: 3 10*3/uL (ref 1.7–7.7)
Neutrophils Relative %: 40 %
Platelets: 195 10*3/uL (ref 150–400)
RBC: 5.03 MIL/uL (ref 4.22–5.81)
RDW: 12.9 % (ref 11.5–15.5)
WBC: 7.5 10*3/uL (ref 4.0–10.5)

## 2016-10-01 MED ORDER — SODIUM CHLORIDE 0.9 % IV SOLN
115.0000 mg | INTRAVENOUS | Status: DC
  Administered 2016-10-01: 115 mg via INTRAVENOUS
  Filled 2016-10-01: qty 2

## 2016-10-01 MED ORDER — SODIUM CHLORIDE 0.9 % IV SOLN
INTRAVENOUS | Status: DC
  Administered 2016-10-01: 08:00:00 via INTRAVENOUS

## 2016-10-06 ENCOUNTER — Encounter: Payer: Self-pay | Admitting: Family Medicine

## 2016-10-06 ENCOUNTER — Ambulatory Visit: Payer: Medicare Other | Attending: Family Medicine | Admitting: Family Medicine

## 2016-10-06 VITALS — BP 129/89 | HR 61 | Temp 98.0°F | Resp 18 | Ht 70.0 in | Wt 289.0 lb

## 2016-10-06 DIAGNOSIS — Z7982 Long term (current) use of aspirin: Secondary | ICD-10-CM | POA: Diagnosis not present

## 2016-10-06 DIAGNOSIS — I890 Lymphedema, not elsewhere classified: Secondary | ICD-10-CM

## 2016-10-06 DIAGNOSIS — Z79899 Other long term (current) drug therapy: Secondary | ICD-10-CM | POA: Diagnosis not present

## 2016-10-06 DIAGNOSIS — Z794 Long term (current) use of insulin: Secondary | ICD-10-CM | POA: Diagnosis not present

## 2016-10-06 DIAGNOSIS — I5032 Chronic diastolic (congestive) heart failure: Secondary | ICD-10-CM | POA: Insufficient documentation

## 2016-10-06 DIAGNOSIS — I10 Essential (primary) hypertension: Secondary | ICD-10-CM | POA: Diagnosis not present

## 2016-10-06 DIAGNOSIS — E118 Type 2 diabetes mellitus with unspecified complications: Secondary | ICD-10-CM

## 2016-10-06 DIAGNOSIS — M1990 Unspecified osteoarthritis, unspecified site: Secondary | ICD-10-CM | POA: Insufficient documentation

## 2016-10-06 DIAGNOSIS — N183 Chronic kidney disease, stage 3 unspecified: Secondary | ICD-10-CM

## 2016-10-06 DIAGNOSIS — E1165 Type 2 diabetes mellitus with hyperglycemia: Secondary | ICD-10-CM

## 2016-10-06 DIAGNOSIS — I130 Hypertensive heart and chronic kidney disease with heart failure and stage 1 through stage 4 chronic kidney disease, or unspecified chronic kidney disease: Secondary | ICD-10-CM | POA: Diagnosis not present

## 2016-10-06 DIAGNOSIS — Z950 Presence of cardiac pacemaker: Secondary | ICD-10-CM | POA: Diagnosis not present

## 2016-10-06 DIAGNOSIS — M109 Gout, unspecified: Secondary | ICD-10-CM | POA: Diagnosis not present

## 2016-10-06 DIAGNOSIS — E1122 Type 2 diabetes mellitus with diabetic chronic kidney disease: Secondary | ICD-10-CM | POA: Insufficient documentation

## 2016-10-06 DIAGNOSIS — IMO0002 Reserved for concepts with insufficient information to code with codable children: Secondary | ICD-10-CM

## 2016-10-06 LAB — POCT GLYCOSYLATED HEMOGLOBIN (HGB A1C): Hemoglobin A1C: 8

## 2016-10-06 LAB — GLUCOSE, POCT (MANUAL RESULT ENTRY): POC Glucose: 115 mg/dl — AB (ref 70–99)

## 2016-10-06 MED ORDER — INSULIN LISPRO PROT & LISPRO (75-25 MIX) 100 UNIT/ML KWIKPEN: 20.0000 [IU] | PEN_INJECTOR | Freq: Two times a day (BID) | SUBCUTANEOUS | 11 refills | Status: DC

## 2016-10-06 MED ORDER — ATORVASTATIN CALCIUM 20 MG PO TABS: 20.0000 mg | ORAL_TABLET | Freq: Every day | ORAL | 5 refills | Status: DC

## 2016-10-06 MED ORDER — ALLOPURINOL 300 MG PO TABS: 300.0000 mg | ORAL_TABLET | Freq: Every day | ORAL | 5 refills | Status: DC

## 2016-10-06 MED ORDER — LOSARTAN POTASSIUM-HCTZ 50-12.5 MG PO TABS: 1.0000 | ORAL_TABLET | Freq: Every day | ORAL | 5 refills | Status: DC

## 2016-10-06 NOTE — Patient Instructions (Signed)
Lymphedema Lymphedema is swelling that is caused by the abnormal collection of lymph under the skin. Lymph is fluid from the tissues in your body that travels in the lymphatic system. This system is part of the immune system and includes lymph nodes and lymph vessels. The lymph vessels collect and carry the excess fluid, fats, proteins, and wastes from the tissues of the body to the bloodstream. This system also works to clean and remove bacteria and waste products from the body. Lymphedema occurs when the lymphatic system is blocked. When the lymph vessels or lymph nodes are blocked or damaged, lymph does not drain properly, causing an abnormal buildup of lymph. This leads to swelling in the arms or legs. Lymphedema cannot be cured by medicines, but various methods can be used to help reduce the swelling. What are the causes? There are two types of lymphedema. Primary lymphedema is caused by the absence or abnormality of the lymph vessel at birth. Secondary lymphedema is more common. It occurs when the lymph vessel is damaged or blocked. Common causes of lymph vessel blockage include:  Skin infection, such as cellulitis.  Infection by parasites (filariasis).  Injury.  Cancer.  Radiation therapy.  Formation of scar tissue.  Surgery. What are the signs or symptoms? Symptoms of this condition include:  Swelling of the arm or leg.  A heavy or tight feeling in the arm or leg.  Swelling of the feet, toes, or fingers. Shoes or rings may fit more tightly than before.  Redness of the skin over the affected area.  Limited movement of the affected limb.  Sensitivity to touch or discomfort in the affected limb. How is this diagnosed? This condition may be diagnosed with:  A physical exam.  Medical history.  Bioimpedance spectroscopy. In this test, painless electrical currents are used to measure fluid levels in your body.  Imaging tests, such as:  Lymphoscintigraphy. In this test, a  low dose of a radioactive substance is injected to trace the flow of lymph through the lymph vessels.  MRI.  CT scan.  Duplex ultrasound. This test uses sound waves to produce images of the vessels and the blood flow on a screen.  Lymphangiography. In this test, a contrast dye is injected into the lymph vessel to help show blockages. How is this treated? Treatment for this condition may depend on the cause. Treatment may include:  Exercise. Certain exercises can help fluid move out of the affected limb.  Massage. Gentle massage of the affected limb can help move the fluid out of the area.  Compression. Various methods may be used to apply pressure to the affected limb in order to reduce the swelling.  Wearing compression stockings or sleeves on the affected limb.  Bandaging the affected limb.  Using an external pump that is attached to a sleeve that alternates between applying pressure and releasing pressure.  Surgery. This is usually only done for severe cases. For example, surgery may be done if you have trouble moving the limb or if the swelling does not get better with other treatments. If an underlying condition is causing the lymphedema, treatment for that condition is needed. For example, antibiotic medicines may be used to treat an infection. Follow these instructions at home: Activities   Exercise regularly as directed by your health care provider.  Do not sit with your legs crossed.  When possible, keep the affected limb raised (elevated) above the level of your heart.  Avoid carrying things with an arm that is   affected by lymphedema.  Remember that the affected area is more likely to become injured or infected.  Take these steps to help prevent infection:  Keep the affected area clean and dry.  Protect your skin from cuts. For example, you should use gloves while cooking or gardening. Do not walk barefoot. If you shave the affected area, use an electric  razor. General instructions   Take medicines only as directed by your health care provider.  Eat a healthy diet that includes a lot of fruits and vegetables.  Do not wear tight clothes, shoes, or jewelry.  Do not use heating pads over the affected area.  Avoid having blood pressure checked on the affected limb.  Keep all follow-up visits as directed by your health care provider. This is important. Contact a health care provider if:  You continue to have swelling in your limb.  You have a fever.  You have a cut that does not heal.  You have redness or pain in the affected area.  You have new swelling in your limb that comes on suddenly.  You develop purplish spots or sores (lesions) on your limb. Get help right away if:  You have a skin rash.  You have chills or sweats.  You have shortness of breath. This information is not intended to replace advice given to you by your health care provider. Make sure you discuss any questions you have with your health care provider. Document Released: 03/21/2007 Document Revised: 01/29/2016 Document Reviewed: 05/01/2014 Elsevier Interactive Patient Education  2017 Elsevier Inc.  

## 2016-10-06 NOTE — Progress Notes (Signed)
F/U DM  Glucose running 102-150 Taking medication as prescribed  No tobacco user  No pain today

## 2016-10-06 NOTE — Progress Notes (Signed)
Subjective:  [name], male    DOB: [birth year]  Age: 56 y.o. MRN: [id number]  CC: Diabetes   HPI [name] is a 56 year old male with a history of type 2 diabetes mellitus (A1c 8.0), hypertension, stage III chronic kidney disease, lymphedema, gout who presents today to establish care with me. Previously followed by Dr. Adrian Blackwater - last visit was 1 year ago.  He takes Humalog 75/25 informs me he takes this on a sliding scale rather than 20 units twice daily which was prescribed. Denies hypoglycemia and fasting sugars have ranged between 102 and 154. Does not exercise much and tries to adhere to a diabetic diet when he can.  Denies any gout flares. Also sees nephrology where he receives his Lasix and Eplenrenone.  Has questions about his lymphedema; wondering when this will completely resolve. Previously saw vascular specialists and was unable to tolerate frequent wrapping and Unna boots. Denies chest pains, shortness of breath.  Past Medical History:  Diagnosis Date  . Bell's palsy   . Bifascicular block   . Chronic bronchitis ([place])    "seasonal; get it q yr"  . Chronic diastolic CHF (congestive heart failure) ([place]) 2010  . Chronic kidney disease (CKD), stage II (mild)    stage II to III/notes 07/23/2014  . Diastolic heart failure secondary to hypertrophic cardiomyopathy ([place]) CARDIOLOGIST-- DR Daneen Schick  . Dyspnea    increased exertion   . Edema 07/2013  . 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 Gout, arthritis 2014   bil feet. right worse  . History of cellulitis of skin with lymphangitis LEFT LEG  . HOCM (hypertrophic obstructive cardiomyopathy) ([place])    a. Echo 11/16: Severe LVH, EF 55-60%, abnormal GLS consistent with HOCM, no SAM, mild LAE  . Hypertension 20 years  . Lymphedema of lower extremity LEFT  >  RIGHT   "using ankle high socks at home; pump doesn't work for me" (07/23/2014)  . Pacemaker    02/12/11  . Pneumonia 08/2011  . Seasonal asthma NO INHALERS   30 years  . Second degree Mobitz II AV block    with syncope, s/p PPM  . Short of breath on exertion   . Type II diabetes mellitus ([place]) 2012   INSULIN DEPENDENT    Past Surgical History:  Procedure Laterality Date  . CARDIAC CATHETERIZATION  03-12-2011  DR Daneen Schick   HYPERTROPHIC CARDIOMYOPATHY WITH LV CAVITY  APPEARANCE CONSISTANT WITH SIGNIFICANT APICAL HYPERTROPHY/ NORMAL LVSF / EF 55%/ EVIDENCE OF DIASTOLIC DYSFUNCTION WITH EDP OF 23-81mHg AFTER A-WAVE/ NORMAL CORONARY ARTERIES  . EVALUATION UNDER ANESTHESIA WITH FISTULECTOMY N/A 06/23/2015   Procedure: EXAM UNDER ANESTHESIA WITH POSSIBLE FISTULOTOMY;  Surgeon: JJudeth Horn MD;  [facility]  Service: General;  Laterality: N/A;  . HERNIA REPAIR  [id number]  Umbilical Hernia Repair  . INCISION AND DRAINAGE PERIRECTAL ABSCESS Left 07/29/2014   Procedure: IRRIGATION AND DEBRIDEMENT PERIRECTAL ABSCESS;  Surgeon: JDoreen Salvage MD;  [facility]  Service: General;  Laterality: Left;  Prone position  . INSERT / REPLACE / REMOVE PACEMAKER  02/12/2011   SJM implanted by Dr ARayann Hemanfor Mobitz II second degree AV block and syncope  . IRRIGATION AND DEBRIDEMENT ABSCESS N/A 07/27/2013   Procedure: IRRIGATION AND DEBRIDEMENT OF SKIN, SOFT TISSUE AND MUSCLES OF UPPER BACK (11X19X4cm) WITH 10 BLADE AND PULSATILE LAVAGE ;  Surgeon: EGayland Curry MD;  [facility]  Service: General;  Laterality: N/A;  . UMBILICAL HERNIA REPAIR  03/22/2012   Procedure: HERNIA REPAIR UMBILICAL ADULT;  Surgeon: Leighton Ruff, MD;  Location: [facility];  Service: General;  Laterality: N/A;  Umbilical Hernia Repair     Allergies  Allergen Reactions  . Shellfish Allergy Hives and Swelling    Outpatient Medications Prior to Visit  Medication Sig Dispense Refill  . ACCU-CHEK AVIVA PLUS test strip TEST 3 TIMES DAILY. ICD 10 E11.8 100 each 12  . ACCU-CHEK FASTCLIX LANCETS MISC 1 each by Other route 3 (three)  times daily. E 11.59 - insulin dependent, needs testing for 3-4 times daily. 102 each 11  . acetaminophen (TYLENOL) 500 MG tablet Take 500 mg by mouth every 14 (fourteen) days. Only taking 1 hour before infusion.  AS premed for Fabrazyme    . ALBUTEROL SULFATE HFA IN Inhale 1 puff into the lungs daily as needed (shortness of breath or wheezing). For shortness of breath    . aspirin 81 MG tablet Take 81 mg by mouth daily.     [name] Kitchen eplerenone (INSPRA) 50 MG tablet Take 1 tablet (50 mg total) by mouth daily. 30 tablet 2  . fexofenadine (ALLEGRA) 180 MG tablet Take 180 mg by mouth every 14 (fourteen) days.     . furosemide (LASIX) 20 MG tablet Take 20 mg by mouth as needed. Lower extremiity edema    . Insulin Pen Needle (PX SHORTLENGTH PEN NEEDLES) 31G X 8 MM MISC 1 each by Does not apply route 2 (two) times daily before a meal. 100 each 8  . allopurinol (ZYLOPRIM) 300 MG tablet TAKE 1 TABLET BY MOUTH EVERY DAY 30 tablet 2  . Insulin Lispro Prot & Lispro (HUMALOG 75/25 MIX) (75-25) 100 UNIT/ML Kwikpen Inject 20 Units into the skin 2 (two) times daily. (Patient taking differently: Inject 0-28 Units into the skin 2 (two) times daily. ) 15 mL 11  . losartan-hydrochlorothiazide (HYZAAR) 50-12.5 MG tablet Take 1 tablet by mouth daily.  6  . Agalsidase beta (FABRAZYME IV) Inject 115 mg into the vein every 14 (fourteen) days.     . insulin aspart (NOVOLOG) 100 UNIT/ML injection Inject 0-15 Units into the skin 3 (three) times daily with meals. (Patient not taking: Reported on 10/06/2016) 10 mL 11   No facility-administered medications prior to visit.     ROS Review of Systems  Constitutional: Negative for activity change and appetite change.  HENT: Negative for sinus pressure and sore throat.   Eyes: Negative for visual disturbance.  Respiratory: Negative for cough, chest tightness and shortness of breath.   Cardiovascular: Negative for chest pain and leg swelling.  Gastrointestinal: Negative for abdominal  distention, abdominal pain, constipation and diarrhea.  Endocrine: Negative.   Genitourinary: Negative for dysuria.  Musculoskeletal: Negative for joint swelling and myalgias.  Skin: Negative for rash.  Allergic/Immunologic: Negative.   Neurological: Negative for weakness, light-headedness and numbness.  Psychiatric/Behavioral: Negative for dysphoric mood and suicidal ideas.    Objective:  BP 129/89 (BP Location: Left Arm, Patient Position: Sitting, Cuff Size: Large)   Pulse 61   Temp 98 F (36.7 C) (Oral)   Resp 18   Ht _0  (1.778 m)   Wt 289 lb (131.1 kg)   SpO2 98%   BMI 41.47 kg/m   BP/Weight 10/06/2016 10/01/2016 12/30/2033  Systolic BP 597 416 384  Diastolic BP 89 78 86  Wt. (Lbs) 289 293 295.2  BMI 41.47 42.04 42.36      Physical Exam  Constitutional:  He is oriented to person, place, and time. He appears well-developed and well-nourished.  Cardiovascular: Normal rate and normal heart sounds.   No murmur heard. Unable to palpate pulses due to lymphedema  Pulmonary/Chest: Effort normal and breath sounds normal. He has no wheezes. He has no rales. He exhibits no tenderness.  Abdominal: Soft. Bowel sounds are normal. He exhibits no distension and no mass. There is no tenderness.  Musculoskeletal: He exhibits edema (lymphedema of both lower extremities).  Neurological: He is alert and oriented to person, place, and time.  Skin: Skin is warm and dry.  Psychiatric: He has a normal mood and affect.     Lab Results  Component Value Date   HGBA1C 8.0 10/06/2016    Assessment & Plan:   1. Uncontrolled type 2 diabetes mellitus with complication, with long-term current use of insulin (HCC) Uncontrolled with A1c of 8.0 Advised against using Humalog 75/25 sliding scale-encouraged to take 20 units twice daily Patient to reduce by 2 units in the event of hypoglycemia - POCT glucose (manual entry) - atorvastatin (LIPITOR) 20 MG tablet; Take 1 tablet (20 mg total) by mouth  daily.  Dispense: 30 tablet; Refill: 5 - CMP14+EGFR - Lipid panel - Microalbumin/Creatinine Ratio, Urine - POCT A1C - Insulin Lispro Prot & Lispro (HUMALOG 75/25 MIX) (75-25) 100 UNIT/ML Kwikpen; Inject 20 Units into the skin 2 (two) times daily.  Dispense: 15 mL; Refill: 11  2. Gout, arthritis No acute flare - allopurinol (ZYLOPRIM) 300 MG tablet; Take 1 tablet (300 mg total) by mouth daily.  Dispense: 30 tablet; Refill: 5 - Uric acid  3. Essential hypertension Controlled - losartan-hydrochlorothiazide (HYZAAR) 50-12.5 MG tablet; Take 1 tablet by mouth daily.  Dispense: 30 tablet; Refill: 5  4. Lymphedema Advised that this is chronic Will benefit from compression stockings and on her but which he is unable to tolerate. Elevate feet Continue Lasix  5. CKD (chronic kidney disease), stage III Avoid nephrotoxins Keep appointment with nephrology   Meds ordered this encounter  Medications  . losartan-hydrochlorothiazide (HYZAAR) 50-12.5 MG tablet    Sig: Take 1 tablet by mouth daily.    Dispense:  30 tablet    Refill:  5  . allopurinol (ZYLOPRIM) 300 MG tablet    Sig: Take 1 tablet (300 mg total) by mouth daily.    Dispense:  30 tablet    Refill:  5  . atorvastatin (LIPITOR) 20 MG tablet    Sig: Take 1 tablet (20 mg total) by mouth daily.    Dispense:  30 tablet    Refill:  5  . Insulin Lispro Prot & Lispro (HUMALOG 75/25 MIX) (75-25) 100 UNIT/ML Kwikpen    Sig: Inject 20 Units into the skin 2 (two) times daily.    Dispense:  15 mL    Refill:  11    DC novolog 70/30 since insurance will no longer cover    Follow-up: Return in about 3 months (around 01/06/2017) for follow up on Diabetes.   Arnoldo Morale MD

## 2016-10-07 LAB — CMP14+EGFR
ALT: 23 IU/L (ref 0–44)
AST: 33 IU/L (ref 0–40)
Albumin/Globulin Ratio: 0.9 — ABNORMAL LOW (ref 1.2–2.2)
Albumin: 3.5 g/dL (ref 3.5–5.5)
Alkaline Phosphatase: 72 IU/L (ref 39–117)
BUN/Creatinine Ratio: 13 (ref 9–20)
BUN: 23 mg/dL (ref 6–24)
Bilirubin Total: 0.4 mg/dL (ref 0.0–1.2)
CO2: 25 mmol/L (ref 18–29)
Calcium: 9.5 mg/dL (ref 8.7–10.2)
Chloride: 102 mmol/L (ref 96–106)
Creatinine, Ser: 1.77 mg/dL — ABNORMAL HIGH (ref 0.76–1.27)
GFR calc Af Amer: 49 mL/min/{1.73_m2} — ABNORMAL LOW (ref >59)
GFR calc non Af Amer: 42 mL/min/{1.73_m2} — ABNORMAL LOW (ref >59)
Globulin, Total: 3.9 g/dL (ref 1.5–4.5)
Glucose: 94 mg/dL (ref 65–99)
Potassium: 4.3 mmol/L (ref 3.5–5.2)
Sodium: 144 mmol/L (ref 134–144)
Total Protein: 7.4 g/dL (ref 6.0–8.5)

## 2016-10-07 LAB — MICROALBUMIN / CREATININE URINE RATIO
Creatinine, Urine: 32.9 mg/dL
Microalb/Creat Ratio: 841.3 mg/g creat — ABNORMAL HIGH (ref 0.0–30.0)
Microalbumin, Urine: 276.8 ug/mL

## 2016-10-07 LAB — LIPID PANEL
Chol/HDL Ratio: 4.7 ratio (ref 0.0–5.0)
Cholesterol, Total: 280 mg/dL — ABNORMAL HIGH (ref 100–199)
HDL: 59 mg/dL (ref >39)
LDL Calculated: 201 mg/dL — ABNORMAL HIGH (ref 0–99)
Triglycerides: 100 mg/dL (ref 0–149)
VLDL Cholesterol Cal: 20 mg/dL (ref 5–40)

## 2016-10-07 LAB — URIC ACID: Uric Acid: 8.1 mg/dL (ref 3.7–8.6)

## 2016-10-08 ENCOUNTER — Telehealth: Payer: Self-pay | Admitting: *Deleted

## 2016-10-08 NOTE — Telephone Encounter (Signed)
Date of birth verified by pt Lab result given. Pt verbalized understaniding  Labs reveal declining kidney function; please advise him to keep appointment with his nephrologist. He also has elevated cholesterol and I have placed him on atorvastatin yesterday at his visit.  Please He has microalbuminuria which indicates early signs of diabetes affecting the kidney. Please encourage compliance with diabetic medications and diabetic dietencourage low-cholesterol diet and exercise Pt stated today Glucose was 80. Advised to continue with PCP health plan. Pt repeated plan advised by pcp

## 2016-10-13 ENCOUNTER — Other Ambulatory Visit: Payer: Self-pay | Admitting: Family Medicine

## 2016-10-13 DIAGNOSIS — Z794 Long term (current) use of insulin: Principal | ICD-10-CM

## 2016-10-13 DIAGNOSIS — E118 Type 2 diabetes mellitus with unspecified complications: Principal | ICD-10-CM

## 2016-10-13 DIAGNOSIS — IMO0002 Reserved for concepts with insufficient information to code with codable children: Secondary | ICD-10-CM

## 2016-10-13 DIAGNOSIS — E1165 Type 2 diabetes mellitus with hyperglycemia: Secondary | ICD-10-CM

## 2016-10-15 ENCOUNTER — Encounter (HOSPITAL_COMMUNITY): Payer: Self-pay

## 2016-10-15 ENCOUNTER — Encounter (HOSPITAL_COMMUNITY)
Admission: RE | Admit: 2016-10-15 | Discharge: 2016-10-15 | Disposition: A | Discharge status: Home / Self Care | Payer: Medicare Other | Source: Ambulatory Visit | Attending: Nephrology | Admitting: Nephrology

## 2016-10-15 DIAGNOSIS — E1101 Type 2 diabetes mellitus with hyperosmolarity with coma: Secondary | ICD-10-CM | POA: Diagnosis not present

## 2016-10-15 DIAGNOSIS — E7521 Fabry (-Anderson) disease: Secondary | ICD-10-CM | POA: Insufficient documentation

## 2016-10-15 LAB — FERRITIN: Ferritin: 97 ng/mL (ref 24–336)

## 2016-10-15 LAB — IRON AND TIBC
Iron: 47 ug/dL (ref 45–182)
Saturation Ratios: 17 % — ABNORMAL LOW (ref 17.9–39.5)
TIBC: 280 ug/dL (ref 250–450)
UIBC: 233 ug/dL

## 2016-10-15 MED ORDER — SODIUM CHLORIDE 0.9 % IV SOLN
115.0000 mg | INTRAVENOUS | Status: DC
  Administered 2016-10-15: 115 mg via INTRAVENOUS
  Filled 2016-10-15: qty 21

## 2016-10-15 MED ORDER — SODIUM CHLORIDE 0.9 % IV SOLN
INTRAVENOUS | Status: DC
  Administered 2016-10-15: 07:00:00 via INTRAVENOUS

## 2016-10-15 NOTE — Discharge Instructions (Signed)
Agalsidase Beta injection What is this medicine? AGALSIDASE BETA is used to replace an enzyme that is missing in patients with Fabry disease. It is not a cure. This medicine may be used for other purposes; ask your health care provider or pharmacist if you have questions. COMMON BRAND NAME(S): Fabrazyme What should I tell my health care provider before I take this medicine? They need to know if you have any of these conditions: -heart disease -an unusual or allergic reaction to agalsidase beta, mannitol, other medicines, foods, dyes, or preservatives -pregnant or trying to get pregnant -breast-feeding How should I use this medicine? This medicine is for infusion into a vein. It is given by a health care professional in a hospital or clinic setting. Talk to your pediatrician regarding the use of this medicine in children. Special care may be needed. Overdosage: If you think you have taken too much of this medicine contact a poison control center or emergency room at once. NOTE: This medicine is only for you. Do not share this medicine with others. What if I miss a dose? It is important not to miss your dose. Call your doctor or health care professional if you are unable to keep an appointment. What may interact with this medicine? -amiodarone -chloroquine -gentamicin -hydroxychloroquine -monobenzone This list may not describe all possible interactions. Give your health care provider a list of all the medicines, herbs, non-prescription drugs, or dietary supplements you use. Also tell them if you smoke, drink alcohol, or use illegal drugs. Some items may interact with your medicine. What should I watch for while using this medicine? Visit your doctor or health care professional for regular checks on your progress. Tell your doctor or healthcare professional if your symptoms do not start to get better or if they get worse. There is a registry for patients with Fabry disease. The registry is  used to gather information about the disease and its effects. Talk to your health care provider if you would like to join the registry. What side effects may I notice from receiving this medicine? Side effects that you should report to your doctor or health care professional as soon as possible: -allergic reactions like skin rash, itching or hives, swelling of the face, lips, or tongue -breathing problems -chest pain, tightness -depression -dizziness -fast, irregular heart beat -swelling of the arms or legs Side effects that usually do not require medical attention (report to your doctor or health care professional if they continue or are bothersome): -aches or pains -anxiety -fever or chills at the time of injection -headache -nausea, vomiting -stomach pain, upset This list may not describe all possible side effects. Call your doctor for medical advice about side effects. You may report side effects to FDA at 1-800-FDA-1088. Where should I keep my medicine? This drug is given in a hospital or clinic and will not be stored at home. NOTE: This sheet is a summary. It may not cover all possible information. If you have questions about this medicine, talk to your doctor, pharmacist, or health care provider.  2018 Elsevier/Gold Standard (2006-01-31 12:25:00)  

## 2016-10-20 ENCOUNTER — Encounter: Payer: Self-pay | Admitting: Family Medicine

## 2016-10-29 ENCOUNTER — Encounter (HOSPITAL_COMMUNITY)
Admission: RE | Admit: 2016-10-29 | Discharge: 2016-10-29 | Disposition: A | Discharge status: Home / Self Care | Payer: Medicare Other | Source: Ambulatory Visit | Attending: Nephrology | Admitting: Nephrology

## 2016-10-29 ENCOUNTER — Encounter (HOSPITAL_COMMUNITY): Payer: Self-pay

## 2016-10-29 DIAGNOSIS — E7521 Fabry (-Anderson) disease: Secondary | ICD-10-CM | POA: Diagnosis not present

## 2016-10-29 DIAGNOSIS — E1101 Type 2 diabetes mellitus with hyperosmolarity with coma: Secondary | ICD-10-CM | POA: Diagnosis not present

## 2016-10-29 LAB — URINALYSIS, ROUTINE W REFLEX MICROSCOPIC
Bilirubin Urine: NEGATIVE
Glucose, UA: NEGATIVE mg/dL
Hgb urine dipstick: NEGATIVE
Ketones, ur: NEGATIVE mg/dL
Nitrite: NEGATIVE
Protein, ur: 100 mg/dL — AB
Specific Gravity, Urine: 1.009 (ref 1.005–1.030)
Squamous Epithelial / LPF: NONE SEEN
pH: 5 (ref 5.0–8.0)

## 2016-10-29 LAB — COMPREHENSIVE METABOLIC PANEL
ALT: 22 U/L (ref 17–63)
AST: 34 U/L (ref 15–41)
Albumin: 3.2 g/dL — ABNORMAL LOW (ref 3.5–5.0)
Alkaline Phosphatase: 60 U/L (ref 38–126)
Anion gap: 10 (ref 5–15)
BUN: 24 mg/dL — ABNORMAL HIGH (ref 6–20)
CO2: 25 mmol/L (ref 22–32)
Calcium: 8.7 mg/dL — ABNORMAL LOW (ref 8.9–10.3)
Chloride: 106 mmol/L (ref 101–111)
Creatinine, Ser: 1.94 mg/dL — ABNORMAL HIGH (ref 0.61–1.24)
GFR calc Af Amer: 43 mL/min — ABNORMAL LOW (ref >60)
GFR calc non Af Amer: 37 mL/min — ABNORMAL LOW (ref >60)
Glucose, Bld: 83 mg/dL (ref 65–99)
Potassium: 3.8 mmol/L (ref 3.5–5.1)
Sodium: 141 mmol/L (ref 135–145)
Total Bilirubin: 0.4 mg/dL (ref 0.3–1.2)
Total Protein: 7.6 g/dL (ref 6.5–8.1)

## 2016-10-29 LAB — PHOSPHORUS: Phosphorus: 4.3 mg/dL (ref 2.5–4.6)

## 2016-10-29 MED ORDER — SODIUM CHLORIDE 0.9 % IV SOLN
INTRAVENOUS | Status: DC
  Administered 2016-10-29: 08:00:00 via INTRAVENOUS

## 2016-10-29 MED ORDER — SODIUM CHLORIDE 0.9 % IV SOLN
115.0000 mg | INTRAVENOUS | Status: DC
  Administered 2016-10-29: 115 mg via INTRAVENOUS
  Filled 2016-10-29: qty 21

## 2016-11-12 ENCOUNTER — Encounter (HOSPITAL_COMMUNITY)
Admission: RE | Admit: 2016-11-12 | Discharge: 2016-11-12 | Disposition: A | Discharge status: Home / Self Care | Payer: Medicare Other | Source: Ambulatory Visit | Attending: Nephrology | Admitting: Nephrology

## 2016-11-12 ENCOUNTER — Encounter (HOSPITAL_COMMUNITY): Payer: Self-pay

## 2016-11-12 DIAGNOSIS — E1101 Type 2 diabetes mellitus with hyperosmolarity with coma: Secondary | ICD-10-CM | POA: Insufficient documentation

## 2016-11-12 DIAGNOSIS — E7521 Fabry (-Anderson) disease: Secondary | ICD-10-CM | POA: Diagnosis not present

## 2016-11-12 MED ORDER — SODIUM CHLORIDE 0.9 % IV SOLN
115.0000 mg | INTRAVENOUS | Status: DC
  Administered 2016-11-12: 115 mg via INTRAVENOUS
  Filled 2016-11-12: qty 21

## 2016-11-12 MED ORDER — SODIUM CHLORIDE 0.9 % IV SOLN
INTRAVENOUS | Status: DC
  Administered 2016-11-12: 09:00:00 via INTRAVENOUS

## 2016-11-18 ENCOUNTER — Other Ambulatory Visit: Payer: Self-pay | Admitting: Family Medicine

## 2016-11-26 ENCOUNTER — Encounter (HOSPITAL_COMMUNITY): Payer: Self-pay

## 2016-11-26 ENCOUNTER — Encounter (HOSPITAL_COMMUNITY)
Admission: RE | Admit: 2016-11-26 | Discharge: 2016-11-26 | Disposition: A | Discharge status: Home / Self Care | Payer: Medicare Other | Source: Ambulatory Visit | Attending: Nephrology | Admitting: Nephrology

## 2016-11-26 DIAGNOSIS — E7521 Fabry (-Anderson) disease: Secondary | ICD-10-CM | POA: Diagnosis not present

## 2016-11-26 DIAGNOSIS — E1101 Type 2 diabetes mellitus with hyperosmolarity with coma: Secondary | ICD-10-CM | POA: Diagnosis not present

## 2016-11-26 LAB — COMPREHENSIVE METABOLIC PANEL
ALT: 18 U/L (ref 17–63)
AST: 29 U/L (ref 15–41)
Albumin: 3.3 g/dL — ABNORMAL LOW (ref 3.5–5.0)
Alkaline Phosphatase: 60 U/L (ref 38–126)
Anion gap: 7 (ref 5–15)
BUN: 24 mg/dL — ABNORMAL HIGH (ref 6–20)
CO2: 25 mmol/L (ref 22–32)
Calcium: 8.7 mg/dL — ABNORMAL LOW (ref 8.9–10.3)
Chloride: 108 mmol/L (ref 101–111)
Creatinine, Ser: 1.73 mg/dL — ABNORMAL HIGH (ref 0.61–1.24)
GFR calc Af Amer: 49 mL/min — ABNORMAL LOW (ref >60)
GFR calc non Af Amer: 43 mL/min — ABNORMAL LOW (ref >60)
Glucose, Bld: 119 mg/dL — ABNORMAL HIGH (ref 65–99)
Potassium: 3.9 mmol/L (ref 3.5–5.1)
Sodium: 140 mmol/L (ref 135–145)
Total Bilirubin: 0.5 mg/dL (ref 0.3–1.2)
Total Protein: 7.5 g/dL (ref 6.5–8.1)

## 2016-11-26 LAB — URINALYSIS, COMPLETE (UACMP) WITH MICROSCOPIC
Bilirubin Urine: NEGATIVE
Glucose, UA: NEGATIVE mg/dL
Hgb urine dipstick: NEGATIVE
Ketones, ur: NEGATIVE mg/dL
Nitrite: NEGATIVE
Protein, ur: 100 mg/dL — AB
Specific Gravity, Urine: 1.01 (ref 1.005–1.030)
pH: 5 (ref 5.0–8.0)

## 2016-11-26 LAB — PHOSPHORUS: Phosphorus: 4.2 mg/dL (ref 2.5–4.6)

## 2016-11-26 MED ORDER — SODIUM CHLORIDE 0.9 % IV SOLN
115.0000 mg | INTRAVENOUS | Status: DC
  Administered 2016-11-26: 115 mg via INTRAVENOUS
  Filled 2016-11-26: qty 21

## 2016-11-26 MED ORDER — SODIUM CHLORIDE 0.9 % IV SOLN
INTRAVENOUS | Status: DC
  Administered 2016-11-26: 08:00:00 via INTRAVENOUS

## 2016-12-10 ENCOUNTER — Encounter (HOSPITAL_COMMUNITY): Payer: Self-pay

## 2016-12-10 ENCOUNTER — Encounter (HOSPITAL_COMMUNITY)
Admission: RE | Admit: 2016-12-10 | Discharge: 2016-12-10 | Disposition: A | Discharge status: Home / Self Care | Payer: Medicare Other | Source: Ambulatory Visit | Attending: Nephrology | Admitting: Nephrology

## 2016-12-10 DIAGNOSIS — E1101 Type 2 diabetes mellitus with hyperosmolarity with coma: Secondary | ICD-10-CM | POA: Diagnosis not present

## 2016-12-10 DIAGNOSIS — E7521 Fabry (-Anderson) disease: Secondary | ICD-10-CM | POA: Diagnosis not present

## 2016-12-10 MED ORDER — SODIUM CHLORIDE 0.9 % IV SOLN
INTRAVENOUS | Status: DC
  Administered 2016-12-10: 09:00:00 via INTRAVENOUS

## 2016-12-10 MED ORDER — SODIUM CHLORIDE 0.9 % IV SOLN
115.0000 mg | INTRAVENOUS | Status: DC
  Administered 2016-12-10: 115 mg via INTRAVENOUS
  Filled 2016-12-10: qty 23

## 2016-12-10 NOTE — Discharge Instructions (Signed)
°  Fabrazyme ° °Agalsidase Beta injection °What is this medicine? °AGALSIDASE BETA is used to replace an enzyme that is missing in patients with Fabry disease. It is not a cure. °This medicine may be used for other purposes; ask your health care provider or pharmacist if you have questions. °COMMON BRAND NAME(S): Fabrazyme °What should I tell my health care provider before I take this medicine? °They need to know if you have any of these conditions: °-heart disease °-an unusual or allergic reaction to agalsidase beta, mannitol, other medicines, foods, dyes, or preservatives °-pregnant or trying to get pregnant °-breast-feeding °How should I use this medicine? °This medicine is for infusion into a vein. It is given by a health care professional in a hospital or clinic setting. °Talk to your pediatrician regarding the use of this medicine in children. Special care may be needed. °Overdosage: If you think you have taken too much of this medicine contact a poison control center or emergency room at once. °NOTE: This medicine is only for you. Do not share this medicine with others. °What if I miss a dose? °It is important not to miss your dose. Call your doctor or health care professional if you are unable to keep an appointment. °What may interact with this medicine? °-amiodarone °-chloroquine °-gentamicin °-hydroxychloroquine °-monobenzone °This list may not describe all possible interactions. Give your health care provider a list of all the medicines, herbs, non-prescription drugs, or dietary supplements you use. Also tell them if you smoke, drink alcohol, or use illegal drugs. Some items may interact with your medicine. °What should I watch for while using this medicine? °Visit your doctor or health care professional for regular checks on your progress. Tell your doctor or healthcare professional if your symptoms do not start to get better or if they get worse. °There is a registry for patients with Fabry disease. The  registry is used to gather information about the disease and its effects. Talk to your health care provider if you would like to join the registry. °What side effects may I notice from receiving this medicine? °Side effects that you should report to your doctor or health care professional as soon as possible: °-allergic reactions like skin rash, itching or hives, swelling of the face, lips, or tongue °-breathing problems °-chest pain, tightness °-depression °-dizziness °-fast, irregular heart beat °-swelling of the arms or legs °Side effects that usually do not require medical attention (report to your doctor or health care professional if they continue or are bothersome): °-aches or pains °-anxiety °-fever or chills at the time of injection °-headache °-nausea, vomiting °-stomach pain, upset °This list may not describe all possible side effects. Call your doctor for medical advice about side effects. You may report side effects to FDA at 1-800-FDA-1088. °Where should I keep my medicine? °This drug is given in a hospital or clinic and will not be stored at home. °NOTE: This sheet is a summary. It may not cover all possible information. If you have questions about this medicine, talk to your doctor, pharmacist, or health care provider. °© 2018 Elsevier/Gold Standard (2006-01-31 12:25:00) ° °

## 2016-12-14 ENCOUNTER — Telehealth: Payer: Self-pay | Admitting: Cardiology

## 2016-12-14 ENCOUNTER — Encounter: Payer: Medicare Other | Admitting: *Deleted

## 2016-12-14 NOTE — Telephone Encounter (Signed)
Spoke with pt and reminded pt of remote transmission that is due today. Pt verbalized understanding.   

## 2016-12-21 ENCOUNTER — Encounter: Payer: Self-pay | Admitting: Cardiology

## 2016-12-22 DIAGNOSIS — H2513 Age-related nuclear cataract, bilateral: Secondary | ICD-10-CM | POA: Diagnosis not present

## 2016-12-22 DIAGNOSIS — E7521 Fabry (-Anderson) disease: Secondary | ICD-10-CM | POA: Diagnosis not present

## 2016-12-22 DIAGNOSIS — H35033 Hypertensive retinopathy, bilateral: Secondary | ICD-10-CM | POA: Diagnosis not present

## 2016-12-22 DIAGNOSIS — E119 Type 2 diabetes mellitus without complications: Secondary | ICD-10-CM | POA: Diagnosis not present

## 2016-12-22 LAB — HM DIABETES EYE EXAM

## 2016-12-24 ENCOUNTER — Encounter (HOSPITAL_COMMUNITY)
Admission: RE | Admit: 2016-12-24 | Discharge: 2016-12-24 | Disposition: A | Discharge status: Home / Self Care | Payer: Medicare Other | Source: Ambulatory Visit | Attending: Nephrology | Admitting: Nephrology

## 2016-12-24 ENCOUNTER — Encounter (HOSPITAL_COMMUNITY): Payer: Self-pay

## 2016-12-24 DIAGNOSIS — I890 Lymphedema, not elsewhere classified: Secondary | ICD-10-CM | POA: Diagnosis not present

## 2016-12-24 DIAGNOSIS — N182 Chronic kidney disease, stage 2 (mild): Secondary | ICD-10-CM | POA: Diagnosis not present

## 2016-12-24 DIAGNOSIS — E118 Type 2 diabetes mellitus with unspecified complications: Secondary | ICD-10-CM | POA: Diagnosis not present

## 2016-12-24 DIAGNOSIS — E7521 Fabry (-Anderson) disease: Secondary | ICD-10-CM | POA: Diagnosis not present

## 2016-12-24 DIAGNOSIS — E1101 Type 2 diabetes mellitus with hyperosmolarity with coma: Secondary | ICD-10-CM | POA: Diagnosis not present

## 2016-12-24 DIAGNOSIS — I129 Hypertensive chronic kidney disease with stage 1 through stage 4 chronic kidney disease, or unspecified chronic kidney disease: Secondary | ICD-10-CM | POA: Diagnosis not present

## 2016-12-24 LAB — COMPREHENSIVE METABOLIC PANEL
ALT: 18 U/L (ref 17–63)
AST: 35 U/L (ref 15–41)
Albumin: 3.2 g/dL — ABNORMAL LOW (ref 3.5–5.0)
Alkaline Phosphatase: 56 U/L (ref 38–126)
Anion gap: 9 (ref 5–15)
BUN: 16 mg/dL (ref 6–20)
CO2: 24 mmol/L (ref 22–32)
Calcium: 8.8 mg/dL — ABNORMAL LOW (ref 8.9–10.3)
Chloride: 108 mmol/L (ref 101–111)
Creatinine, Ser: 1.6 mg/dL — ABNORMAL HIGH (ref 0.61–1.24)
GFR calc Af Amer: 54 mL/min — ABNORMAL LOW (ref >60)
GFR calc non Af Amer: 47 mL/min — ABNORMAL LOW (ref >60)
Glucose, Bld: 150 mg/dL — ABNORMAL HIGH (ref 65–99)
Potassium: 3.8 mmol/L (ref 3.5–5.1)
Sodium: 141 mmol/L (ref 135–145)
Total Bilirubin: 0.7 mg/dL (ref 0.3–1.2)
Total Protein: 7.4 g/dL (ref 6.5–8.1)

## 2016-12-24 LAB — URINALYSIS, ROUTINE W REFLEX MICROSCOPIC
Bilirubin Urine: NEGATIVE
Glucose, UA: NEGATIVE mg/dL
Hgb urine dipstick: NEGATIVE
Ketones, ur: NEGATIVE mg/dL
Nitrite: NEGATIVE
Protein, ur: 100 mg/dL — AB
Specific Gravity, Urine: 1.011 (ref 1.005–1.030)
Squamous Epithelial / LPF: NONE SEEN
pH: 5 (ref 5.0–8.0)

## 2016-12-24 LAB — PHOSPHORUS: Phosphorus: 3.8 mg/dL (ref 2.5–4.6)

## 2016-12-24 MED ORDER — SODIUM CHLORIDE 0.9 % IV SOLN
115.0000 mg | INTRAVENOUS | Status: DC
  Administered 2016-12-24: 115 mg via INTRAVENOUS
  Filled 2016-12-24: qty 21

## 2016-12-24 MED ORDER — SODIUM CHLORIDE 0.9 % IV SOLN
INTRAVENOUS | Status: DC
  Administered 2016-12-24: 12:00:00 via INTRAVENOUS

## 2016-12-24 NOTE — Progress Notes (Signed)
Pt was informed today that fasting lipid profile will be done at the 01/21/17 0800 appointment.  Pt is to fast that morning.  Pt voiced understanding.  Wrote on pt's appointment sheet also.

## 2017-01-07 ENCOUNTER — Encounter (HOSPITAL_COMMUNITY)
Admission: RE | Admit: 2017-01-07 | Discharge: 2017-01-07 | Disposition: A | Discharge status: Home / Self Care | Payer: Medicare Other | Source: Ambulatory Visit | Attending: Nephrology | Admitting: Nephrology

## 2017-01-07 ENCOUNTER — Encounter (HOSPITAL_COMMUNITY): Payer: Self-pay

## 2017-01-07 DIAGNOSIS — E1101 Type 2 diabetes mellitus with hyperosmolarity with coma: Secondary | ICD-10-CM | POA: Diagnosis not present

## 2017-01-07 DIAGNOSIS — E7521 Fabry (-Anderson) disease: Secondary | ICD-10-CM | POA: Insufficient documentation

## 2017-01-07 MED ORDER — SODIUM CHLORIDE 0.9 % IV SOLN
115.0000 mg | INTRAVENOUS | Status: DC
  Administered 2017-01-07: 115 mg via INTRAVENOUS
  Filled 2017-01-07: qty 23

## 2017-01-07 MED ORDER — SODIUM CHLORIDE 0.9 % IV SOLN
INTRAVENOUS | Status: DC
  Administered 2017-01-07: 07:00:00 via INTRAVENOUS

## 2017-01-07 NOTE — Discharge Instructions (Signed)
Fabry Disease °Fabry disease is a rare genetic disorder. A gene defect (mutation) alters a protein, called an enzyme, that normally breaks down a particular fat (globotriaosylceramide). The mutation keeps the enzyme from breaking down the fat. This causes excess fat to build up in the blood vessels (vasculature) of many organs of the body. Over time, this results in less blood flow and fewer nutrients getting to the body's organs. The buildup can also wear away at nerve fibers. That causes pain and other symptoms. The areas most often affected are the hands, feet, skin, eyes, and digestive system. Eventually the fatty deposits can affect your cardiovascular system, nervous system, and kidneys. This can cause a heart attack, stroke, kidney damage, or other life-threatening complications. °What are the causes? °Fabry disease is caused by a genetic mutation. This mutation is passed down through families (inherited). °What increases the risk? °You may be more likely to have Fabry disease if: °· You are male. °· You have a family history of the disease. ° °What are the signs or symptoms? °Symptoms of Fabry disease include: °· A rash of small, raised, reddish-purple dots on the skin (angiokeratomas). °· Cloudy eyes or other eye problems. °· Chronic pain, especially in the hands or feet. °· Burning sensations in the hands and feet. °· Gastrointestinal problems, such as bloating, diarrhea, and frequent bowel movements. °· Ringing in the ears (tinnitus). °· Hearing loss. °· Decreased sweat production (hypohidrosis) or no sweat production (anhidrosis). ° °Over time, untreated Fabry disease can cause: °· Severe back pain in the area of the kidneys. °· Heart attack. °· Stroke. ° °How is this diagnosed? °Your health care provider may suspect Fabry disease based on your symptoms and family history. Your health care provider will also perform a physical exam. This may include tests, such as: °· A blood test to measure enzyme  activity in your blood (enzyme assay). °· A blood test to look for mutations in the gene that causes Fabry disease. °· Urine tests to check how well your kidneys are working and to check for protein in your urine. °· An ultrasound of your heart (echocardiogram) to create an image of your heart and check for heart disease. ° °If you or your partner has Fabry disease and you are having a child, your child can be screened for the condition at birth. If the results of that test are out of the normal range, the child may need follow-up blood and urine tests. These would help confirm the diagnosis and help determine treatment. Prenatal testing for Fabry disease also can be done. °How is this treated? °Medicines are used to manage Fabry disease. This may include: °· Enzyme replacement therapy. This medicine performs the functions of the enzyme that is missing in Fabry disease. °· Medicines to treat the pain and discomfort in your hands and feet. ° °You may also need treatment for complications caused by Fabry Disease. These complications may be related to your kidneys or heart. Treatments may include: °· Angioplasty to repair or unblock an artery. °· Heart surgery. °· Dialysis to filter your kidneys. °· Kidney transplant. ° °Follow these instructions at home: °· Avoid stressful situations when possible. Stress can worsen the pain related to Fabry disease. °· Stay indoors when the temperatures are very hot or very cold. °· Exercise carefully, as directed by your health care provider. Physical exertion can make Fabry disease worse. °· Practice good hygiene to avoid illnesses that make Fabry disease worse. °· Have a good support system   in place. °Contact a health care provider if: °· You have worsening pain. °· You have worsening vision. °· You have a noticeable decrease in urination. °· You have swelling of your hands, feet, or lower legs that is getting worse. °· You feel increasingly weak or tired. °Get help right away  if: °· You have trouble breathing, chest pain, or a feeling of pressure in your chest. °· You have stroke symptoms. These include: °? Numbness or weakness on one side of your body. °? Droopiness on one side of your face. °? Slurred speech. °? Confusion. °· You have sharp pain in the area of your kidneys. °This information is not intended to replace advice given to you by your health care provider. Make sure you discuss any questions you have with your health care provider. °Document Released: 05/14/2002 Document Revised: 10/30/2015 Document Reviewed: 01/16/2014 °Elsevier Interactive Patient Education © 2018 Elsevier Inc. ° °

## 2017-01-21 ENCOUNTER — Encounter (HOSPITAL_COMMUNITY): Payer: Self-pay

## 2017-01-21 ENCOUNTER — Encounter (HOSPITAL_COMMUNITY)
Admission: RE | Admit: 2017-01-21 | Discharge: 2017-01-21 | Disposition: A | Discharge status: Home / Self Care | Payer: Medicare Other | Source: Ambulatory Visit | Attending: Nephrology | Admitting: Nephrology

## 2017-01-21 DIAGNOSIS — E7521 Fabry (-Anderson) disease: Secondary | ICD-10-CM | POA: Diagnosis not present

## 2017-01-21 DIAGNOSIS — E1101 Type 2 diabetes mellitus with hyperosmolarity with coma: Secondary | ICD-10-CM | POA: Diagnosis not present

## 2017-01-21 LAB — PHOSPHORUS: Phosphorus: 4.7 mg/dL — ABNORMAL HIGH (ref 2.5–4.6)

## 2017-01-21 LAB — URINALYSIS, COMPLETE (UACMP) WITH MICROSCOPIC
Bilirubin Urine: NEGATIVE
Glucose, UA: NEGATIVE mg/dL
Hgb urine dipstick: NEGATIVE
Ketones, ur: NEGATIVE mg/dL
Nitrite: NEGATIVE
Protein, ur: 100 mg/dL — AB
Specific Gravity, Urine: 1.009 (ref 1.005–1.030)
Squamous Epithelial / LPF: NONE SEEN
pH: 6 (ref 5.0–8.0)

## 2017-01-21 LAB — CBC WITH DIFFERENTIAL/PLATELET
Basophils Absolute: 0 10*3/uL (ref 0.0–0.1)
Basophils Relative: 0 %
Eosinophils Absolute: 0.4 10*3/uL (ref 0.0–0.7)
Eosinophils Relative: 6 %
HCT: 42.9 % (ref 39.0–52.0)
Hemoglobin: 14.5 g/dL (ref 13.0–17.0)
Lymphocytes Relative: 48 %
Lymphs Abs: 3.5 10*3/uL (ref 0.7–4.0)
MCH: 30.2 pg (ref 26.0–34.0)
MCHC: 33.8 g/dL (ref 30.0–36.0)
MCV: 89.4 fL (ref 78.0–100.0)
Monocytes Absolute: 0.7 10*3/uL (ref 0.1–1.0)
Monocytes Relative: 10 %
Neutro Abs: 2.7 10*3/uL (ref 1.7–7.7)
Neutrophils Relative %: 36 %
Platelets: 214 10*3/uL (ref 150–400)
RBC: 4.8 MIL/uL (ref 4.22–5.81)
RDW: 13.2 % (ref 11.5–15.5)
WBC: 7.3 10*3/uL (ref 4.0–10.5)

## 2017-01-21 LAB — COMPREHENSIVE METABOLIC PANEL
ALT: 19 U/L (ref 17–63)
AST: 35 U/L (ref 15–41)
Albumin: 3.3 g/dL — ABNORMAL LOW (ref 3.5–5.0)
Alkaline Phosphatase: 60 U/L (ref 38–126)
Anion gap: 9 (ref 5–15)
BUN: 22 mg/dL — ABNORMAL HIGH (ref 6–20)
CO2: 27 mmol/L (ref 22–32)
Calcium: 9 mg/dL (ref 8.9–10.3)
Chloride: 105 mmol/L (ref 101–111)
Creatinine, Ser: 2 mg/dL — ABNORMAL HIGH (ref 0.61–1.24)
GFR calc Af Amer: 42 mL/min — ABNORMAL LOW (ref >60)
GFR calc non Af Amer: 36 mL/min — ABNORMAL LOW (ref >60)
Glucose, Bld: 94 mg/dL (ref 65–99)
Potassium: 4.2 mmol/L (ref 3.5–5.1)
Sodium: 141 mmol/L (ref 135–145)
Total Bilirubin: 0.6 mg/dL (ref 0.3–1.2)
Total Protein: 7.6 g/dL (ref 6.5–8.1)

## 2017-01-21 LAB — IRON AND TIBC
Iron: 70 ug/dL (ref 45–182)
Saturation Ratios: 24 % (ref 17.9–39.5)
TIBC: 297 ug/dL (ref 250–450)
UIBC: 227 ug/dL

## 2017-01-21 LAB — LIPID PANEL
Cholesterol: 220 mg/dL — ABNORMAL HIGH (ref 0–200)
HDL: 50 mg/dL (ref >40)
LDL Cholesterol: 152 mg/dL — ABNORMAL HIGH (ref 0–99)
Total CHOL/HDL Ratio: 4.4 RATIO
Triglycerides: 89 mg/dL (ref <150)
VLDL: 18 mg/dL (ref 0–40)

## 2017-01-21 LAB — FERRITIN: Ferritin: 128 ng/mL (ref 24–336)

## 2017-01-21 MED ORDER — SODIUM CHLORIDE 0.9 % IV SOLN
115.0000 mg | INTRAVENOUS | Status: DC
  Administered 2017-01-21: 115 mg via INTRAVENOUS
  Filled 2017-01-21: qty 21

## 2017-01-21 MED ORDER — SODIUM CHLORIDE 0.9 % IV SOLN
INTRAVENOUS | Status: DC
  Administered 2017-01-21: 08:00:00 via INTRAVENOUS

## 2017-01-25 ENCOUNTER — Other Ambulatory Visit: Payer: Self-pay | Admitting: Family Medicine

## 2017-01-25 DIAGNOSIS — I10 Essential (primary) hypertension: Secondary | ICD-10-CM

## 2017-02-04 ENCOUNTER — Ambulatory Visit (HOSPITAL_COMMUNITY)
Admission: RE | Admit: 2017-02-04 | Discharge: 2017-02-04 | Disposition: A | Discharge status: Home / Self Care | Payer: Medicare Other | Source: Ambulatory Visit | Attending: Nephrology | Admitting: Nephrology

## 2017-02-04 ENCOUNTER — Encounter (HOSPITAL_COMMUNITY): Payer: Self-pay

## 2017-02-04 DIAGNOSIS — E7521 Fabry (-Anderson) disease: Secondary | ICD-10-CM | POA: Insufficient documentation

## 2017-02-04 MED ORDER — SODIUM CHLORIDE 0.9 % IV SOLN
115.0000 mg | INTRAVENOUS | Status: DC
  Administered 2017-02-04: 115 mg via INTRAVENOUS
  Filled 2017-02-04: qty 21

## 2017-02-04 MED ORDER — SODIUM CHLORIDE 0.9 % IV SOLN
INTRAVENOUS | Status: DC
  Administered 2017-02-04: 09:00:00 via INTRAVENOUS

## 2017-02-18 ENCOUNTER — Encounter (HOSPITAL_COMMUNITY): Payer: Self-pay

## 2017-02-18 ENCOUNTER — Encounter (HOSPITAL_COMMUNITY)
Admission: RE | Admit: 2017-02-18 | Discharge: 2017-02-18 | Disposition: A | Discharge status: Home / Self Care | Payer: Medicare Other | Source: Ambulatory Visit | Attending: Nephrology | Admitting: Nephrology

## 2017-02-18 DIAGNOSIS — E7521 Fabry (-Anderson) disease: Secondary | ICD-10-CM | POA: Diagnosis not present

## 2017-02-18 DIAGNOSIS — E1101 Type 2 diabetes mellitus with hyperosmolarity with coma: Secondary | ICD-10-CM | POA: Diagnosis not present

## 2017-02-18 LAB — COMPREHENSIVE METABOLIC PANEL
ALT: 18 U/L (ref 17–63)
AST: 27 U/L (ref 15–41)
Albumin: 3.1 g/dL — ABNORMAL LOW (ref 3.5–5.0)
Alkaline Phosphatase: 64 U/L (ref 38–126)
Anion gap: 9 (ref 5–15)
BUN: 19 mg/dL (ref 6–20)
CO2: 25 mmol/L (ref 22–32)
Calcium: 8.8 mg/dL — ABNORMAL LOW (ref 8.9–10.3)
Chloride: 106 mmol/L (ref 101–111)
Creatinine, Ser: 1.48 mg/dL — ABNORMAL HIGH (ref 0.61–1.24)
GFR calc Af Amer: 59 mL/min — ABNORMAL LOW (ref >60)
GFR calc non Af Amer: 51 mL/min — ABNORMAL LOW (ref >60)
Glucose, Bld: 145 mg/dL — ABNORMAL HIGH (ref 65–99)
Potassium: 3.3 mmol/L — ABNORMAL LOW (ref 3.5–5.1)
Sodium: 140 mmol/L (ref 135–145)
Total Bilirubin: 0.6 mg/dL (ref 0.3–1.2)
Total Protein: 7.2 g/dL (ref 6.5–8.1)

## 2017-02-18 LAB — URINALYSIS, COMPLETE (UACMP) WITH MICROSCOPIC
Bilirubin Urine: NEGATIVE
Glucose, UA: NEGATIVE mg/dL
Hgb urine dipstick: NEGATIVE
Ketones, ur: NEGATIVE mg/dL
Nitrite: POSITIVE — AB
Protein, ur: 100 mg/dL — AB
Specific Gravity, Urine: 1.01 (ref 1.005–1.030)
pH: 5 (ref 5.0–8.0)

## 2017-02-18 LAB — PHOSPHORUS: Phosphorus: 3.9 mg/dL (ref 2.5–4.6)

## 2017-02-18 MED ORDER — SODIUM CHLORIDE 0.9 % IV SOLN
INTRAVENOUS | Status: DC
  Administered 2017-02-18: 09:00:00 via INTRAVENOUS

## 2017-02-18 MED ORDER — AGALSIDASE BETA 5 MG IV SOLR
115.0000 mg | INTRAVENOUS | Status: DC
  Administered 2017-02-18: 115 mg via INTRAVENOUS
  Filled 2017-02-18: qty 21

## 2017-02-18 NOTE — Discharge Instructions (Signed)
Agalsidase Beta injection/Fabrazyme  What is this medicine? AGALSIDASE BETA is used to replace an enzyme that is missing in patients with Fabry disease. It is not a cure. This medicine may be used for other purposes; ask your health care provider or pharmacist if you have questions. COMMON BRAND NAME(S): Fabrazyme What should I tell my health care provider before I take this medicine? They need to know if you have any of these conditions: -heart disease -an unusual or allergic reaction to agalsidase beta, mannitol, other medicines, foods, dyes, or preservatives -pregnant or trying to get pregnant -breast-feeding How should I use this medicine? This medicine is for infusion into a vein. It is given by a health care professional in a hospital or clinic setting. Talk to your pediatrician regarding the use of this medicine in children. Special care may be needed. Overdosage: If you think you have taken too much of this medicine contact a poison control center or emergency room at once. NOTE: This medicine is only for you. Do not share this medicine with others. What if I miss a dose? It is important not to miss your dose. Call your doctor or health care professional if you are unable to keep an appointment. What may interact with this medicine? -amiodarone -chloroquine -gentamicin -hydroxychloroquine -monobenzone This list may not describe all possible interactions. Give your health care provider a list of all the medicines, herbs, non-prescription drugs, or dietary supplements you use. Also tell them if you smoke, drink alcohol, or use illegal drugs. Some items may interact with your medicine. What should I watch for while using this medicine? Visit your doctor or health care professional for regular checks on your progress. Tell your doctor or healthcare professional if your symptoms do not start to get better or if they get worse. There is a registry for patients with Fabry disease. The  registry is used to gather information about the disease and its effects. Talk to your health care provider if you would like to join the registry. What side effects may I notice from receiving this medicine? Side effects that you should report to your doctor or health care professional as soon as possible: -allergic reactions like skin rash, itching or hives, swelling of the face, lips, or tongue -breathing problems -chest pain, tightness -depression -dizziness -fast, irregular heart beat -swelling of the arms or legs Side effects that usually do not require medical attention (report to your doctor or health care professional if they continue or are bothersome): -aches or pains -anxiety -fever or chills at the time of injection -headache -nausea, vomiting -stomach pain, upset This list may not describe all possible side effects. Call your doctor for medical advice about side effects. You may report side effects to FDA at 1-800-FDA-1088. Where should I keep my medicine? This drug is given in a hospital or clinic and will not be stored at home. NOTE: This sheet is a summary. It may not cover all possible information. If you have questions about this medicine, talk to your doctor, pharmacist, or health care provider.  2018 Elsevier/Gold Standard (2006-01-31 12:25:00)

## 2017-03-02 DIAGNOSIS — R350 Frequency of micturition: Secondary | ICD-10-CM | POA: Diagnosis not present

## 2017-03-04 ENCOUNTER — Encounter (HOSPITAL_COMMUNITY)
Admission: RE | Admit: 2017-03-04 | Discharge: 2017-03-04 | Disposition: A | Discharge status: Home / Self Care | Payer: Medicare Other | Source: Ambulatory Visit | Attending: Nephrology | Admitting: Nephrology

## 2017-03-04 ENCOUNTER — Encounter (HOSPITAL_COMMUNITY): Payer: Self-pay

## 2017-03-04 DIAGNOSIS — E7521 Fabry (-Anderson) disease: Secondary | ICD-10-CM | POA: Diagnosis not present

## 2017-03-04 DIAGNOSIS — E1101 Type 2 diabetes mellitus with hyperosmolarity with coma: Secondary | ICD-10-CM | POA: Diagnosis not present

## 2017-03-04 MED ORDER — SODIUM CHLORIDE 0.9 % IV SOLN
INTRAVENOUS | Status: DC
  Administered 2017-03-04: 09:00:00 via INTRAVENOUS

## 2017-03-04 MED ORDER — SODIUM CHLORIDE 0.9 % IV SOLN
115.0000 mg | INTRAVENOUS | Status: DC
  Administered 2017-03-04: 115 mg via INTRAVENOUS
  Filled 2017-03-04: qty 21

## 2017-03-18 ENCOUNTER — Encounter (HOSPITAL_COMMUNITY)
Admission: RE | Admit: 2017-03-18 | Discharge: 2017-03-18 | Disposition: A | Discharge status: Home / Self Care | Payer: Medicare Other | Source: Ambulatory Visit | Attending: Nephrology | Admitting: Nephrology

## 2017-03-18 ENCOUNTER — Encounter (HOSPITAL_COMMUNITY): Payer: Self-pay

## 2017-03-18 DIAGNOSIS — E1101 Type 2 diabetes mellitus with hyperosmolarity with coma: Secondary | ICD-10-CM | POA: Diagnosis not present

## 2017-03-18 DIAGNOSIS — E7521 Fabry (-Anderson) disease: Secondary | ICD-10-CM | POA: Insufficient documentation

## 2017-03-18 LAB — COMPREHENSIVE METABOLIC PANEL
ALT: 18 U/L (ref 17–63)
AST: 31 U/L (ref 15–41)
Albumin: 3.2 g/dL — ABNORMAL LOW (ref 3.5–5.0)
Alkaline Phosphatase: 71 U/L (ref 38–126)
Anion gap: 11 (ref 5–15)
BUN: 19 mg/dL (ref 6–20)
CO2: 25 mmol/L (ref 22–32)
Calcium: 8.7 mg/dL — ABNORMAL LOW (ref 8.9–10.3)
Chloride: 105 mmol/L (ref 101–111)
Creatinine, Ser: 1.52 mg/dL — ABNORMAL HIGH (ref 0.61–1.24)
GFR calc Af Amer: 57 mL/min — ABNORMAL LOW (ref >60)
GFR calc non Af Amer: 50 mL/min — ABNORMAL LOW (ref >60)
Glucose, Bld: 172 mg/dL — ABNORMAL HIGH (ref 65–99)
Potassium: 3.8 mmol/L (ref 3.5–5.1)
Sodium: 141 mmol/L (ref 135–145)
Total Bilirubin: 0.5 mg/dL (ref 0.3–1.2)
Total Protein: 7.5 g/dL (ref 6.5–8.1)

## 2017-03-18 LAB — URINALYSIS, COMPLETE (UACMP) WITH MICROSCOPIC
Bilirubin Urine: NEGATIVE
Glucose, UA: NEGATIVE mg/dL
Hgb urine dipstick: NEGATIVE
Ketones, ur: NEGATIVE mg/dL
Nitrite: POSITIVE — AB
Protein, ur: 100 mg/dL — AB
Specific Gravity, Urine: 1.01 (ref 1.005–1.030)
pH: 5 (ref 5.0–8.0)

## 2017-03-18 LAB — PHOSPHORUS: Phosphorus: 3.4 mg/dL (ref 2.5–4.6)

## 2017-03-18 MED ORDER — SODIUM CHLORIDE 0.9 % IV SOLN
115.0000 mg | INTRAVENOUS | Status: DC
  Administered 2017-03-18: 115 mg via INTRAVENOUS
  Filled 2017-03-18: qty 2

## 2017-03-18 MED ORDER — SODIUM CHLORIDE 0.9 % IV SOLN
INTRAVENOUS | Status: DC
  Administered 2017-03-18: 09:00:00 via INTRAVENOUS

## 2017-03-18 NOTE — Discharge Instructions (Signed)
Agalsidase Beta injection What is this medicine? AGALSIDASE BETA is used to replace an enzyme that is missing in patients with Fabry disease. It is not a cure. This medicine may be used for other purposes; ask your health care provider or pharmacist if you have questions. COMMON BRAND NAME(S): Fabrazyme What should I tell my health care provider before I take this medicine? They need to know if you have any of these conditions: -heart disease -an unusual or allergic reaction to agalsidase beta, mannitol, other medicines, foods, dyes, or preservatives -pregnant or trying to get pregnant -breast-feeding How should I use this medicine? This medicine is for infusion into a vein. It is given by a health care professional in a hospital or clinic setting. Talk to your pediatrician regarding the use of this medicine in children. Special care may be needed. Overdosage: If you think you have taken too much of this medicine contact a poison control center or emergency room at once. NOTE: This medicine is only for you. Do not share this medicine with others. What if I miss a dose? It is important not to miss your dose. Call your doctor or health care professional if you are unable to keep an appointment. What may interact with this medicine? -amiodarone -chloroquine -gentamicin -hydroxychloroquine -monobenzone This list may not describe all possible interactions. Give your health care provider a list of all the medicines, herbs, non-prescription drugs, or dietary supplements you use. Also tell them if you smoke, drink alcohol, or use illegal drugs. Some items may interact with your medicine. What should I watch for while using this medicine? Visit your doctor or health care professional for regular checks on your progress. Tell your doctor or healthcare professional if your symptoms do not start to get better or if they get worse. There is a registry for patients with Fabry disease. The registry is  used to gather information about the disease and its effects. Talk to your health care provider if you would like to join the registry. What side effects may I notice from receiving this medicine? Side effects that you should report to your doctor or health care professional as soon as possible: -allergic reactions like skin rash, itching or hives, swelling of the face, lips, or tongue -breathing problems -chest pain, tightness -depression -dizziness -fast, irregular heart beat -swelling of the arms or legs Side effects that usually do not require medical attention (report to your doctor or health care professional if they continue or are bothersome): -aches or pains -anxiety -fever or chills at the time of injection -headache -nausea, vomiting -stomach pain, upset This list may not describe all possible side effects. Call your doctor for medical advice about side effects. You may report side effects to FDA at 1-800-FDA-1088. Where should I keep my medicine? This drug is given in a hospital or clinic and will not be stored at home. NOTE: This sheet is a summary. It may not cover all possible information. If you have questions about this medicine, talk to your doctor, pharmacist, or health care provider.  2018 Elsevier/Gold Standard (2006-01-31 12:25:00)  

## 2017-04-01 ENCOUNTER — Encounter (HOSPITAL_COMMUNITY)
Admission: RE | Admit: 2017-04-01 | Discharge: 2017-04-01 | Disposition: A | Discharge status: Home / Self Care | Payer: Medicare Other | Source: Ambulatory Visit | Attending: Nephrology | Admitting: Nephrology

## 2017-04-01 ENCOUNTER — Encounter (HOSPITAL_COMMUNITY): Payer: Self-pay

## 2017-04-01 DIAGNOSIS — E7521 Fabry (-Anderson) disease: Secondary | ICD-10-CM | POA: Diagnosis not present

## 2017-04-01 DIAGNOSIS — E1101 Type 2 diabetes mellitus with hyperosmolarity with coma: Secondary | ICD-10-CM | POA: Diagnosis not present

## 2017-04-01 MED ORDER — SODIUM CHLORIDE 0.9 % IV SOLN
INTRAVENOUS | Status: DC
  Administered 2017-04-01: 08:00:00 via INTRAVENOUS

## 2017-04-01 MED ORDER — SODIUM CHLORIDE 0.9 % IV SOLN
115.0000 mg | INTRAVENOUS | Status: DC
  Administered 2017-04-01: 115 mg via INTRAVENOUS
  Filled 2017-04-01: qty 21

## 2017-04-01 NOTE — Discharge Instructions (Signed)
Agalsidase Beta injection/Fabrazyme Infusion  What is this medicine? AGALSIDASE BETA is used to replace an enzyme that is missing in patients with Fabry disease. It is not a cure. This medicine may be used for other purposes; ask your health care provider or pharmacist if you have questions. COMMON BRAND NAME(S): Fabrazyme What should I tell my health care provider before I take this medicine? They need to know if you have any of these conditions: -heart disease -an unusual or allergic reaction to agalsidase beta, mannitol, other medicines, foods, dyes, or preservatives -pregnant or trying to get pregnant -breast-feeding How should I use this medicine? This medicine is for infusion into a vein. It is given by a health care professional in a hospital or clinic setting. Talk to your pediatrician regarding the use of this medicine in children. Special care may be needed. Overdosage: If you think you have taken too much of this medicine contact a poison control center or emergency room at once. NOTE: This medicine is only for you. Do not share this medicine with others. What if I miss a dose? It is important not to miss your dose. Call your doctor or health care professional if you are unable to keep an appointment. What may interact with this medicine? -amiodarone -chloroquine -gentamicin -hydroxychloroquine -monobenzone This list may not describe all possible interactions. Give your health care provider a list of all the medicines, herbs, non-prescription drugs, or dietary supplements you use. Also tell them if you smoke, drink alcohol, or use illegal drugs. Some items may interact with your medicine. What should I watch for while using this medicine? Visit your doctor or health care professional for regular checks on your progress. Tell your doctor or healthcare professional if your symptoms do not start to get better or if they get worse. There is a registry for patients with Fabry  disease. The registry is used to gather information about the disease and its effects. Talk to your health care provider if you would like to join the registry. What side effects may I notice from receiving this medicine? Side effects that you should report to your doctor or health care professional as soon as possible: -allergic reactions like skin rash, itching or hives, swelling of the face, lips, or tongue -breathing problems -chest pain, tightness -depression -dizziness -fast, irregular heart beat -swelling of the arms or legs Side effects that usually do not require medical attention (report to your doctor or health care professional if they continue or are bothersome): -aches or pains -anxiety -fever or chills at the time of injection -headache -nausea, vomiting -stomach pain, upset This list may not describe all possible side effects. Call your doctor for medical advice about side effects. You may report side effects to FDA at 1-800-FDA-1088. Where should I keep my medicine? This drug is given in a hospital or clinic and will not be stored at home. NOTE: This sheet is a summary. It may not cover all possible information. If you have questions about this medicine, talk to your doctor, pharmacist, or health care provider.  2018 Elsevier/Gold Standard (2006-01-31 12:25:00)

## 2017-04-15 ENCOUNTER — Encounter (HOSPITAL_COMMUNITY)
Admission: RE | Admit: 2017-04-15 | Discharge: 2017-04-15 | Disposition: A | Discharge status: Home / Self Care | Payer: Medicare Other | Source: Ambulatory Visit | Attending: Nephrology | Admitting: Nephrology

## 2017-04-15 DIAGNOSIS — E1101 Type 2 diabetes mellitus with hyperosmolarity with coma: Secondary | ICD-10-CM | POA: Insufficient documentation

## 2017-04-15 DIAGNOSIS — E7521 Fabry (-Anderson) disease: Secondary | ICD-10-CM | POA: Diagnosis not present

## 2017-04-15 LAB — URINALYSIS, COMPLETE (UACMP) WITH MICROSCOPIC
Bilirubin Urine: NEGATIVE
Glucose, UA: NEGATIVE mg/dL
Hgb urine dipstick: NEGATIVE
Ketones, ur: NEGATIVE mg/dL
Nitrite: POSITIVE — AB
Protein, ur: 100 mg/dL — AB
Specific Gravity, Urine: 1.01 (ref 1.005–1.030)
pH: 5 (ref 5.0–8.0)

## 2017-04-15 LAB — LIPID PANEL
Cholesterol: 228 mg/dL — ABNORMAL HIGH (ref 0–200)
HDL: 57 mg/dL (ref >40)
LDL Cholesterol: 145 mg/dL — ABNORMAL HIGH (ref 0–99)
Total CHOL/HDL Ratio: 4 RATIO
Triglycerides: 128 mg/dL (ref <150)
VLDL: 26 mg/dL (ref 0–40)

## 2017-04-15 LAB — COMPREHENSIVE METABOLIC PANEL
ALT: 23 U/L (ref 17–63)
AST: 34 U/L (ref 15–41)
Albumin: 3 g/dL — ABNORMAL LOW (ref 3.5–5.0)
Alkaline Phosphatase: 88 U/L (ref 38–126)
Anion gap: 9 (ref 5–15)
BUN: 17 mg/dL (ref 6–20)
CO2: 26 mmol/L (ref 22–32)
Calcium: 8.7 mg/dL — ABNORMAL LOW (ref 8.9–10.3)
Chloride: 109 mmol/L (ref 101–111)
Creatinine, Ser: 1.66 mg/dL — ABNORMAL HIGH (ref 0.61–1.24)
GFR calc Af Amer: 52 mL/min — ABNORMAL LOW (ref >60)
GFR calc non Af Amer: 45 mL/min — ABNORMAL LOW (ref >60)
Glucose, Bld: 157 mg/dL — ABNORMAL HIGH (ref 65–99)
Potassium: 3.6 mmol/L (ref 3.5–5.1)
Sodium: 144 mmol/L (ref 135–145)
Total Bilirubin: 0.3 mg/dL (ref 0.3–1.2)
Total Protein: 7.2 g/dL (ref 6.5–8.1)

## 2017-04-15 LAB — CBC WITH DIFFERENTIAL/PLATELET
Basophils Absolute: 0 10*3/uL (ref 0.0–0.1)
Basophils Relative: 0 %
Eosinophils Absolute: 0.4 10*3/uL (ref 0.0–0.7)
Eosinophils Relative: 4 %
HCT: 44.1 % (ref 39.0–52.0)
Hemoglobin: 14.8 g/dL (ref 13.0–17.0)
Lymphocytes Relative: 34 %
Lymphs Abs: 2.9 10*3/uL (ref 0.7–4.0)
MCH: 30.4 pg (ref 26.0–34.0)
MCHC: 33.6 g/dL (ref 30.0–36.0)
MCV: 90.6 fL (ref 78.0–100.0)
Monocytes Absolute: 0.8 10*3/uL (ref 0.1–1.0)
Monocytes Relative: 10 %
Neutro Abs: 4.5 10*3/uL (ref 1.7–7.7)
Neutrophils Relative %: 52 %
Platelets: 211 10*3/uL (ref 150–400)
RBC: 4.87 MIL/uL (ref 4.22–5.81)
RDW: 13.1 % (ref 11.5–15.5)
WBC: 8.6 10*3/uL (ref 4.0–10.5)

## 2017-04-15 LAB — IRON AND TIBC
Iron: 47 ug/dL (ref 45–182)
Saturation Ratios: 16 % — ABNORMAL LOW (ref 17.9–39.5)
TIBC: 291 ug/dL (ref 250–450)
UIBC: 244 ug/dL

## 2017-04-15 LAB — PHOSPHORUS: Phosphorus: 3.5 mg/dL (ref 2.5–4.6)

## 2017-04-15 LAB — FERRITIN: Ferritin: 78 ng/mL (ref 24–336)

## 2017-04-15 MED ORDER — SODIUM CHLORIDE 0.9 % IV SOLN
115.0000 mg | INTRAVENOUS | Status: DC
  Administered 2017-04-15: 115 mg via INTRAVENOUS
  Filled 2017-04-15: qty 21

## 2017-04-15 MED ORDER — SODIUM CHLORIDE 0.9 % IV SOLN
INTRAVENOUS | Status: DC
  Administered 2017-04-15: 08:00:00 via INTRAVENOUS

## 2017-04-27 ENCOUNTER — Encounter: Payer: Self-pay | Admitting: Interventional Cardiology

## 2017-05-02 ENCOUNTER — Encounter (HOSPITAL_COMMUNITY)
Admission: RE | Admit: 2017-05-02 | Discharge: 2017-05-02 | Disposition: A | Discharge status: Home / Self Care | Payer: Medicare Other | Source: Ambulatory Visit | Attending: Nephrology | Admitting: Nephrology

## 2017-05-02 ENCOUNTER — Encounter (HOSPITAL_COMMUNITY): Payer: Self-pay

## 2017-05-02 DIAGNOSIS — E7521 Fabry (-Anderson) disease: Secondary | ICD-10-CM | POA: Diagnosis not present

## 2017-05-02 DIAGNOSIS — E1101 Type 2 diabetes mellitus with hyperosmolarity with coma: Secondary | ICD-10-CM | POA: Diagnosis not present

## 2017-05-02 MED ORDER — SODIUM CHLORIDE 0.9 % IV SOLN
INTRAVENOUS | Status: DC
  Administered 2017-05-02: 08:00:00 via INTRAVENOUS

## 2017-05-02 MED ORDER — SODIUM CHLORIDE 0.9 % IV SOLN
115.0000 mg | INTRAVENOUS | Status: DC
  Administered 2017-05-02: 115 mg via INTRAVENOUS
  Filled 2017-05-02: qty 2

## 2017-05-09 ENCOUNTER — Encounter: Payer: Self-pay | Admitting: Interventional Cardiology

## 2017-05-09 ENCOUNTER — Ambulatory Visit: Payer: Medicare Other | Admitting: Interventional Cardiology

## 2017-05-09 VITALS — BP 130/82 | HR 61 | Ht 70.0 in | Wt 298.0 lb

## 2017-05-09 DIAGNOSIS — I442 Atrioventricular block, complete: Secondary | ICD-10-CM | POA: Diagnosis not present

## 2017-05-09 DIAGNOSIS — N183 Chronic kidney disease, stage 3 unspecified: Secondary | ICD-10-CM

## 2017-05-09 DIAGNOSIS — I5032 Chronic diastolic (congestive) heart failure: Secondary | ICD-10-CM

## 2017-05-09 DIAGNOSIS — Z950 Presence of cardiac pacemaker: Secondary | ICD-10-CM | POA: Diagnosis not present

## 2017-05-09 NOTE — Progress Notes (Signed)
Cardiology Office Note    Date:  05/09/2017   ID:  [name], DOB 1961-05-22, MRN [id number]  PCP:  Ladell Pier, MD  Cardiologist: Sinclair Grooms, MD   No chief complaint on file.   History of Present Illness:  [name] is a 56 y.o. male who presents for cardiomyopathy, Fabry's disease, hypertension, AV conduction system disease related to Fabry's, and permanent pacemaker. He has bilateral lymphedema.   He is doing relatively well. Lilia Argue swelling continues. He denies orthopnea, PND, and chest pain.   Past Medical History:  Diagnosis Date  . Bell's palsy   . Bifascicular block   . Chronic bronchitis ([place])    "seasonal; get it q yr"  . Chronic diastolic CHF (congestive heart failure) ([place]) 2010  . Chronic kidney disease (CKD), stage II (mild)    stage II to III/notes 07/23/2014  . Diastolic heart failure secondary to hypertrophic cardiomyopathy ([place]) CARDIOLOGIST-- DR Daneen Schick  . Dyspnea    increased exertion   . Edema 07/2013  . 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 Gout, arthritis 2014   bil feet. right worse  . History of cellulitis of skin with lymphangitis LEFT LEG  . HOCM (hypertrophic obstructive cardiomyopathy) ([place])    a. Echo 11/16: Severe LVH, EF 55-60%, abnormal GLS consistent with HOCM, no SAM, mild LAE  . Hypertension 20 years  . Lymphedema of lower extremity LEFT  >  RIGHT   "using ankle high socks at home; pump doesn't work for me" (07/23/2014)  . Pacemaker    02/12/11  . Pneumonia 08/2011  . Seasonal asthma NO INHALERS   30 years  . Second degree Mobitz II AV block    with syncope, s/p PPM  . Short of breath on exertion   . Type II diabetes mellitus ([place]) 2012   INSULIN DEPENDENT    Past Surgical History:  Procedure Laterality Date  . CARDIAC CATHETERIZATION  03-12-2011  DR Daneen Schick   HYPERTROPHIC CARDIOMYOPATHY WITH LV CAVITY  APPEARANCE CONSISTANT WITH SIGNIFICANT  APICAL HYPERTROPHY/ NORMAL LVSF / EF 55%/ EVIDENCE OF DIASTOLIC DYSFUNCTION WITH EDP OF 23-69mmHg AFTER A-WAVE/ NORMAL CORONARY ARTERIES  . EVALUATION UNDER ANESTHESIA WITH FISTULECTOMY N/A 06/23/2015   Procedure: EXAM UNDER ANESTHESIA WITH POSSIBLE FISTULOTOMY;  Surgeon: Judeth Horn, MD;  Location: [facility];  Service: General;  Laterality: N/A;  . HERNIA REPAIR  [id number]   Umbilical Hernia Repair  . INCISION AND DRAINAGE PERIRECTAL ABSCESS Left 07/29/2014   Procedure: IRRIGATION AND DEBRIDEMENT PERIRECTAL ABSCESS;  Surgeon: Doreen Salvage, MD;  Location: [facility];  Service: General;  Laterality: Left;  Prone position  . INSERT / REPLACE / REMOVE PACEMAKER  02/12/2011   SJM implanted by Dr Rayann Heman for Mobitz II second degree AV block and syncope  . IRRIGATION AND DEBRIDEMENT ABSCESS N/A 07/27/2013   Procedure: IRRIGATION AND DEBRIDEMENT OF SKIN, SOFT TISSUE AND MUSCLES OF UPPER BACK (11X19X4cm) WITH 10 BLADE AND PULSATILE LAVAGE ;  Surgeon: Gayland Curry, MD;  Location: [facility];  Service: General;  Laterality: N/A;  . UMBILICAL HERNIA REPAIR  03/22/2012   Procedure: HERNIA REPAIR UMBILICAL ADULT;  Surgeon: Leighton Ruff, MD;  Location: [facility];  Service: General;  Laterality: N/A;  Umbilical Hernia Repair     Current Medications: Outpatient Medications Prior to Visit  Medication Sig Dispense Refill  . ACCU-CHEK AVIVA PLUS test strip TEST 3 TIMES DAILY. ICD 10 E11.8 100 each 12  .  ACCU-CHEK FASTCLIX LANCETS MISC USE TO TEST BLOOD SUGAR 3 TO 4 TIMES DAILY 102 each 5  . acetaminophen (TYLENOL) 500 MG tablet Take 500 mg by mouth every 14 (fourteen) days. Only taking 1 hour before infusion.  AS premed for Fabrazyme    . Agalsidase beta (FABRAZYME IV) Inject 115 mg into the vein every 14 (fourteen) days.     . ALBUTEROL SULFATE HFA IN Inhale 1 puff into the lungs daily as needed (shortness of breath or wheezing). For shortness of breath    . allopurinol (ZYLOPRIM) 300 MG tablet Take 1 tablet (300 mg total) by mouth daily.  30 tablet 5  . aspirin 81 MG tablet Take 81 mg by mouth daily.     [name] Kitchen atorvastatin (LIPITOR) 20 MG tablet Take 1 tablet (20 mg total) by mouth daily. 30 tablet 5  . fexofenadine (ALLEGRA) 180 MG tablet Take 180 mg by mouth every 14 (fourteen) days.     . furosemide (LASIX) 20 MG tablet Take 20 mg by mouth as needed. Lower extremiity edema    . Insulin Lispro Prot & Lispro (HUMALOG 75/25 MIX) (75-25) 100 UNIT/ML Kwikpen Inject 20 Units into the skin 2 (two) times daily. 15 mL 11  . Insulin Pen Needle (PX SHORTLENGTH PEN NEEDLES) 31G X 8 MM MISC 1 each by Does not apply route 2 (two) times daily before a meal. 100 each 8  . losartan-hydrochlorothiazide (HYZAAR) 50-12.5 MG tablet Take 1 tablet by mouth daily. 30 tablet 5  . OLIVE LEAF PO Take 1 tablet by mouth 2 (two) times daily.      No facility-administered medications prior to visit.      Allergies:   Shellfish allergy   Social History   Socioeconomic History  . Marital status: Divorced    Spouse name: None  . Number of children: None  . Years of education: None  . Highest education level: None  Social Needs  . Financial resource strain: None  . Food insecurity - worry: None  . Food insecurity - inability: None  . Transportation needs - medical: None  . Transportation needs - non-medical: None  Occupational History  . None  Tobacco Use  . Smoking status: Never Smoker  . Smokeless tobacco: Never Used  Substance and Sexual Activity  . Alcohol use: Yes    Comment: 07/23/2014 "might have a few drinks on holidays or at cookouts"  . Drug use: No  . Sexual activity: Yes  Other Topics Concern  . None  Social History Narrative   Coaches [employer]      Family History:  The patient's family history includes Stroke in his mother.   ROS:   Please see the history of present illness.    Chronic bilateral lower extremity swelling, dyspnea on exertion but unchanged from prior.  All other systems reviewed and are  negative.   PHYSICAL EXAM:   VS:  BP 130/82   Pulse 61   Ht 5\' 10"  (1.778 m)   Wt 298 lb (135.2 kg)   BMI 42.76 kg/m    GEN: Well nourished, well developed, in no acute distress  HEENT: normal  Neck: no JVD, carotid bruits, or masses Cardiac: RRR; no murmurs, rubs, or gallops . Marked tense bilateral lower extremity edema. Unchanged from previous. Respiratory:  clear to auscultation bilaterally, normal work of breathing GI: soft, nontender, nondistended, + BS MS: no deformity or atrophy  Skin: warm and dry, no rash Neuro:  Alert and Oriented x 3, Strength  and sensation are intact Psych: euthymic mood, full affect  Wt Readings from Last 3 Encounters:  05/09/17 298 lb (135.2 kg)  05/02/17 293 lb 9.6 oz (133.2 kg)  04/15/17 293 lb 12.8 oz (133.3 kg)      Studies/Labs Reviewed:   EKG:  EKG  ECG demonstrates sinus rhythm at 60 bpm, atrial pacing and ventricular pacing. No change compared to prior. Rate is 60 bpm.  Recent Labs: 04/15/2017: ALT 23; BUN 17; Creatinine, Ser 1.66; Hemoglobin 14.8; Platelets 211; Potassium 3.6; Sodium 144   Lipid Panel    Component Value Date/Time   CHOL 228 (H) 04/15/2017 0741   CHOL 280 (H) 10/06/2016 1038   TRIG 128 04/15/2017 0741   HDL 57 04/15/2017 0741   HDL 59 10/06/2016 1038   CHOLHDL 4.0 04/15/2017 0741   VLDL 26 04/15/2017 0741   LDLCALC 145 (H) 04/15/2017 0741   LDLCALC 201 (H) 10/06/2016 1038    Additional studies/ records that were reviewed today include:  No new data    ASSESSMENT:    1. Complete heart block ([place])   2. Pacemaker-[employer]   3. Chronic diastolic HF (heart failure) ([place])   4. CKD (chronic kidney disease), stage III ([place])      PLAN:  In order of problems listed above:  1. High-grade AV block due to Fabry's disease is being treated with permanent pacemaker. 2. Normal function is best I can tell in [place] at recent data over the past 12 months. 3. No evidence of volume overload related to diastolic  heart failure. 4. Chronic kidney disease stage III, stable and combined etiology including Fabricius and diabetes.   Plan clinical follow-up in 9-12 months or earlier if problems.    Medication Adjustments/Labs and Tests Ordered: Current medicines are reviewed at length with the patient today.  Concerns regarding medicines are outlined above.  Medication changes, Labs and Tests ordered today are listed in the Patient Instructions below. There are no Patient Instructions on file for this visit.   Signed, Sinclair Grooms, MD  05/09/2017 3:27 PM    [facility] [address], [place], [place]  [address] Phone: [phone]; Fax: [phone]

## 2017-05-09 NOTE — Patient Instructions (Signed)
Medication Instructions:  Your physician recommends that you continue on your current medications as directed. Please refer to the Current Medication list given to you today.  Labwork: None  Testing/Procedures: None  Follow-Up: Your physician wants you to follow-up in: 9-12 months with Dr. Smith.  You will receive a reminder letter in the mail two months in advance. If you don't receive a letter, please call our office to schedule the follow-up appointment.   Any Other Special Instructions Will Be Listed Below (If Applicable).     If you need a refill on your cardiac medications before your next appointment, please call your pharmacy.   

## 2017-05-12 ENCOUNTER — Ambulatory Visit: Payer: Medicare Other | Attending: Internal Medicine | Admitting: Internal Medicine

## 2017-05-12 ENCOUNTER — Encounter: Payer: Self-pay | Admitting: Internal Medicine

## 2017-05-12 VITALS — BP 145/92 | HR 87 | Temp 98.8°F | Ht 71.0 in | Wt 299.0 lb

## 2017-05-12 DIAGNOSIS — E1122 Type 2 diabetes mellitus with diabetic chronic kidney disease: Secondary | ICD-10-CM | POA: Diagnosis not present

## 2017-05-12 DIAGNOSIS — I421 Obstructive hypertrophic cardiomyopathy: Secondary | ICD-10-CM | POA: Insufficient documentation

## 2017-05-12 DIAGNOSIS — E7521 Fabry (-Anderson) disease: Secondary | ICD-10-CM | POA: Diagnosis not present

## 2017-05-12 DIAGNOSIS — E785 Hyperlipidemia, unspecified: Secondary | ICD-10-CM

## 2017-05-12 DIAGNOSIS — I441 Atrioventricular block, second degree: Secondary | ICD-10-CM | POA: Diagnosis not present

## 2017-05-12 DIAGNOSIS — I890 Lymphedema, not elsewhere classified: Secondary | ICD-10-CM

## 2017-05-12 DIAGNOSIS — Z79899 Other long term (current) drug therapy: Secondary | ICD-10-CM | POA: Insufficient documentation

## 2017-05-12 DIAGNOSIS — R0981 Nasal congestion: Secondary | ICD-10-CM | POA: Diagnosis not present

## 2017-05-12 DIAGNOSIS — Z7982 Long term (current) use of aspirin: Secondary | ICD-10-CM | POA: Insufficient documentation

## 2017-05-12 DIAGNOSIS — I5032 Chronic diastolic (congestive) heart failure: Secondary | ICD-10-CM | POA: Insufficient documentation

## 2017-05-12 DIAGNOSIS — N183 Chronic kidney disease, stage 3 unspecified: Secondary | ICD-10-CM

## 2017-05-12 DIAGNOSIS — J449 Chronic obstructive pulmonary disease, unspecified: Secondary | ICD-10-CM | POA: Diagnosis not present

## 2017-05-12 DIAGNOSIS — Z23 Encounter for immunization: Secondary | ICD-10-CM

## 2017-05-12 DIAGNOSIS — I10 Essential (primary) hypertension: Secondary | ICD-10-CM | POA: Diagnosis not present

## 2017-05-12 DIAGNOSIS — Z794 Long term (current) use of insulin: Secondary | ICD-10-CM

## 2017-05-12 DIAGNOSIS — M109 Gout, unspecified: Secondary | ICD-10-CM | POA: Insufficient documentation

## 2017-05-12 DIAGNOSIS — I130 Hypertensive heart and chronic kidney disease with heart failure and stage 1 through stage 4 chronic kidney disease, or unspecified chronic kidney disease: Secondary | ICD-10-CM | POA: Diagnosis not present

## 2017-05-12 DIAGNOSIS — Z950 Presence of cardiac pacemaker: Secondary | ICD-10-CM | POA: Insufficient documentation

## 2017-05-12 DIAGNOSIS — E118 Type 2 diabetes mellitus with unspecified complications: Secondary | ICD-10-CM | POA: Diagnosis not present

## 2017-05-12 LAB — POCT GLYCOSYLATED HEMOGLOBIN (HGB A1C): Hemoglobin A1C: 7.1

## 2017-05-12 LAB — GLUCOSE, POCT (MANUAL RESULT ENTRY): POC Glucose: 132 mg/dl — AB (ref 70–99)

## 2017-05-12 MED ORDER — FLUTICASONE PROPIONATE 50 MCG/ACT NA SUSP: 1.0000 | Freq: Every day | NASAL | 1 refills | Status: DC

## 2017-05-12 MED ORDER — LOSARTAN POTASSIUM-HCTZ 50-12.5 MG PO TABS: 1.0000 | ORAL_TABLET | Freq: Every day | ORAL | 5 refills | Status: DC

## 2017-05-12 MED ORDER — ATORVASTATIN CALCIUM 20 MG PO TABS: 20.0000 mg | ORAL_TABLET | Freq: Every day | ORAL | 5 refills | Status: DC

## 2017-05-12 NOTE — Progress Notes (Signed)
Patient is here to establish care and start paper work on handicap parking permit.

## 2017-05-12 NOTE — Patient Instructions (Addendum)
Take the Atorvastatin every day not as needed.   Use the Flonase nasal spray daily for one week then as needed.   You are due for Pneumovax (Pneumonia vaccine).  Please stop at the pharmacy to get this.

## 2017-05-12 NOTE — Progress Notes (Signed)
[name], male    DOB: 1960/07/08  MRN: [id number]  CC: Establish Care   Subjective: [name] is a 56 y.o. male who presents for chronic disease management.  Last seen by Dr. Jarold Song 5/201 8 His concerns today include:  Patient with history of diabetes type 2 with microalbuminuria, diastolic CHF with pacemaker (secconary to AV block with syncopy), CKD stage III, HOCM, HTN, HL, lymphedema. 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 Fabry's Ds: gets an enzyme infusion Q 2 wks at [facility] ordered by nephrologist. -c/o swelling in legs despite taking Furosemide -use to go to [facility] lymphedema clinic for leg wraps. However wrapings come loose easily once he starts ambulating. Uses compression device at nights and elevates legs.   2. CKD 3: has f/u appt later this mth Dr. Blanche East with [place] [facility] -makes good urine with Furosemide.   3. DM:  Checking BS in a.m. Range 94- 138. Highest was 221 as an outlier  Med: compliant with Humalog 75/25 20 units BID but some days he takes less if BS low Eating habits: cooking more rather "than [employer] or the frozen food section.  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 Has a Silver Sneakers card but he has not joined a gym as yet -gained 10 lbs since 02/2017.  4. HTN/CHF: Compliant with med but did not take Coz/HCTZ as yet for the day No CP. Some SOB with ambulation which is stable for him  5. Request handicap sticker form to be completed. He will expire in 05/2018  6. Gets feeling of rushing wind past his face intermittently for past several wks. Associated with sinus congestion. Always clearing throat  to clear phlegm. No tinnitis. No fever   Patient Active Problem List   Diagnosis Date Noted  . Lymphedema 10/06/2016  . Gout, arthritis   . 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 Chronic ulcer of left foot ([place])  04/21/2015  . Onychomycosis of toenail 09/26/2014  . Encounter for wound care 08/12/2014  . Iron deficiency anemia 07/29/2014  . CKD (chronic kidney disease), stage III ([place])   . Chronic diastolic HF (heart failure) ([place]) 03/27/2013  . Peripheral edema 07/20/2012  . Obstructive sleep apnea, suspected 06/21/2012  . Pacemaker-St.Jude 02/23/2012  . Hypertension 08/09/2011  . COPD with asthma ([place]) 08/09/2011  . Mobitz type II atrioventricular block 05/19/2011  . 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[place]) 05/19/2011     Current Outpatient Medications on File Prior to Visit  Medication Sig Dispense Refill  . ACCU-CHEK AVIVA PLUS test strip TEST 3 TIMES DAILY. ICD 10 E11.8 100 each 12  . ACCU-CHEK FASTCLIX LANCETS MISC USE TO TEST BLOOD SUGAR 3 TO 4 TIMES DAILY 102 each 5  . acetaminophen (TYLENOL) 500 MG tablet Take 500 mg by mouth every 14 (fourteen) days. Only taking 1 hour before infusion.  AS premed for Fabrazyme    . Agalsidase beta (FABRAZYME IV) Inject 115 mg into the vein every 14 (fourteen) days.     . ALBUTEROL SULFATE HFA IN Inhale 1 puff into the lungs daily as needed (shortness of breath or wheezing). For shortness of breath    . allopurinol (ZYLOPRIM) 300 MG tablet Take 1 tablet (300 mg total) by mouth daily. 30 tablet 5  . aspirin 81 MG tablet Take 81 mg by mouth daily.     [name] Kitchen atorvastatin (LIPITOR) 20 MG tablet Take  1 tablet (20 mg total) by mouth daily. 30 tablet 5  . furosemide (LASIX) 20 MG tablet Take 20 mg by mouth as needed. Lower extremiity edema    . Insulin Lispro Prot & Lispro (HUMALOG 75/25 MIX) (75-25) 100 UNIT/ML Kwikpen Inject 20 Units into the skin 2 (two) times daily. 15 mL 11  . Insulin Pen Needle (PX SHORTLENGTH PEN NEEDLES) 31G X 8 MM MISC 1 each by Does not apply route 2 (two) times daily before a meal. 100 each 8  . losartan-hydrochlorothiazide (HYZAAR) 50-12.5 MG tablet Take 1 tablet by mouth daily. 30 tablet 5  . OLIVE LEAF PO Take 1 tablet by mouth 2 (two) times daily.     .  fexofenadine (ALLEGRA) 180 MG tablet Take 180 mg by mouth every 14 (fourteen) days.      No current facility-administered medications on file prior to visit.     Allergies  Allergen Reactions  . Shellfish Allergy Hives and Swelling    Social History   Socioeconomic History  . Marital status: Divorced    Spouse name: Not on file  . Number of children: Not on file  . Years of education: Not on file  . Highest education level: Not on file  Social Needs  . Financial resource strain: Not on file  . Food insecurity - worry: Not on file  . Food insecurity - inability: Not on file  . Transportation needs - medical: Not on file  . Transportation needs - non-medical: Not on file  Occupational History  . Not on file  Tobacco Use  . Smoking status: Never Smoker  . Smokeless tobacco: Never Used  Substance and Sexual Activity  . Alcohol use: Yes    Comment: 07/23/2014 "might have a few drinks on holidays or at cookouts"  . Drug use: No  . Sexual activity: Yes  Other Topics Concern  . 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 Stroke Mother   . Colon cancer Neg Hx     Past Surgical History:  Procedure Laterality Date  . CARDIAC CATHETERIZATION  03-12-2011  DR Daneen Schick   HYPERTROPHIC CARDIOMYOPATHY WITH LV CAVITY  APPEARANCE CONSISTANT WITH SIGNIFICANT APICAL HYPERTROPHY/ NORMAL LVSF / EF 55%/ EVIDENCE OF DIASTOLIC DYSFUNCTION WITH EDP OF 23-6mmHg AFTER A-WAVE/ NORMAL CORONARY ARTERIES  . EVALUATION UNDER ANESTHESIA WITH FISTULECTOMY N/A 06/23/2015   Procedure: EXAM UNDER ANESTHESIA WITH POSSIBLE FISTULOTOMY;  Surgeon: Judeth Horn, MD;  Location: [place];  Service: General;  Laterality: N/A;  . HERNIA REPAIR  [id number]   Umbilical Hernia Repair  . INCISION AND DRAINAGE PERIRECTAL ABSCESS Left 07/29/2014   Procedure: IRRIGATION AND DEBRIDEMENT PERIRECTAL ABSCESS;  Surgeon: Doreen Salvage, MD;  Location: [place];   Service: General;  Laterality: Left;  Prone position  . INSERT / REPLACE / REMOVE PACEMAKER  02/12/2011   SJM implanted by Dr Rayann Heman for Mobitz II second degree AV block and syncope  . IRRIGATION AND DEBRIDEMENT ABSCESS N/A 07/27/2013   Procedure: IRRIGATION AND DEBRIDEMENT OF SKIN, SOFT TISSUE AND MUSCLES OF UPPER BACK (11X19X4cm) WITH 10 BLADE AND PULSATILE LAVAGE ;  Surgeon: Gayland Curry, MD;  Location: [place];  Service: General;  Laterality: N/A;  . UMBILICAL HERNIA REPAIR  03/22/2012   Procedure: HERNIA REPAIR UMBILICAL ADULT;  Surgeon: Leighton Ruff, MD;  Location: [facility];  Service: General;  Laterality: N/A;  Umbilical Hernia Repair  ROS: Review of Systems Neg except as above  PHYSICAL EXAM: BP (!) 176/83 (BP Location: Left Arm, Patient Position: Sitting, Cuff Size: Normal)   Pulse 87   Temp 98.8 F (37.1 C) (Oral)   Ht 5\' 11"  (1.803 m)   Wt 299 lb (135.6 kg)   SpO2 100%   BMI 41.70 kg/m    Wt Readings from Last 3 Encounters:  05/12/17 299 lb (135.6 kg)  05/09/17 298 lb (135.2 kg)  05/02/17 293 lb 9.6 oz (133.2 kg)  BP 145/92  Physical Exam General appearance - alert, well appearing, middle-aged male and in no distress Mental status - alert, oriented to person, place, and time, normal mood, behavior, speech, dress, motor activity, and thought processes Neck - supple, no significant adenopathy Chest - clear to auscultation, no wheezes, rales or rhonchi, symmetric air entry Heart - normal rate, regular rhythm, normal S1, S2, no murmurs, rubs, clicks or gallops Extremities -significant lymphedema of both lower extremities left greater than right    Chemistry      Component Value Date/Time   NA 144 04/15/2017 0741   NA 144 10/06/2016 1038   K 3.6 04/15/2017 0741   CL 109 04/15/2017 0741   CO2 26 04/15/2017 0741   BUN 17 04/15/2017 0741   BUN 23 10/06/2016 1038   CREATININE 1.66 (H) 04/15/2017 0741   CREATININE 1.28 11/07/2014 0910      Component Value Date/Time    CALCIUM 8.7 (L) 04/15/2017 0741   ALKPHOS 88 04/15/2017 0741   AST 34 04/15/2017 0741   ALT 23 04/15/2017 0741   BILITOT 0.3 04/15/2017 0741   BILITOT 0.4 10/06/2016 1038     Lab Results  Component Value Date   WBC 8.6 04/15/2017   HGB 14.8 04/15/2017   HCT 44.1 04/15/2017   MCV 90.6 04/15/2017   PLT 211 04/15/2017   Lab Results  Component Value Date   CHOL 228 (H) 04/15/2017   HDL 57 04/15/2017   LDLCALC 145 (H) 04/15/2017   TRIG 128 04/15/2017   CHOLHDL 4.0 04/15/2017   Results for orders placed or performed in visit on 05/12/17  Glucose (CBG)  Result Value Ref Range   POC Glucose 132 (A) 70 - 99 mg/dl  HgB A1c  Result Value Ref Range   Hemoglobin A1C 7.1      ASSESSMENT AND PLAN: 1. Controlled type 2 diabetes mellitus with complication, with long-term current use of insulin (HCC) A1c improved.  Recorded blood sugar readings are good.  Continue current dose of Humulin 75/25 Commended him on improve eating habits and encouraged him to continue Continue trying to stay active - Glucose (CBG) - HgB A1c  2. Essential hypertension Not at goal patient did not take medicine as yet for the morning.  Continue current dose. - losartan-hydrochlorothiazide (HYZAAR) 50-12.5 MG tablet; Take 1 tablet by mouth daily.  Dispense: 30 tablet; Refill: 5  3. CKD (chronic kidney disease), stage III (HCC) Stable.  Followed by nephrology.  4. Lymphedema Patient not interested in going back to the lymphedema clinic.  He will continue to use his compression device at night times.  Lymphedema is a part of Febry's disease  5. Hyperlipidemia, unspecified hyperlipidemia type -Patient taking Lipitor as needed.  Last lipid profile not at goal.  Advised him to take the Lipitor every day - atorvastatin (LIPITOR) 20 MG tablet; Take 1 tablet (20 mg total) by mouth daily.  Dispense: 30 tablet; Refill: 5  6. Fabry's disease ([place])   7.  Sinus congestion - fluticasone (FLONASE) 50 MCG/ACT nasal  spray; Place 1 spray into both nostrils daily.  Dispense: 16 g; Refill: 1  8. Need for influenza vaccination - Flu Vaccine QUAD 6+ mos PF IM (Fluarix Quad PF)  Patient advised that we can fill out a renewal form for handicap sticker once he is due. Patient was given the opportunity to ask questions.  Patient verbalized understanding of the plan and was able to repeat key elements of the plan.   Orders Placed This Encounter  Procedures  . Glucose (CBG)  . HgB A1c     Requested Prescriptions    No prescriptions requested or ordered in this encounter    Follow-up in 3 months.   Karle Plumber, MD, FACP

## 2017-05-19 DIAGNOSIS — E7521 Fabry (-Anderson) disease: Secondary | ICD-10-CM | POA: Diagnosis not present

## 2017-05-19 DIAGNOSIS — I890 Lymphedema, not elsewhere classified: Secondary | ICD-10-CM | POA: Diagnosis not present

## 2017-05-19 DIAGNOSIS — E118 Type 2 diabetes mellitus with unspecified complications: Secondary | ICD-10-CM | POA: Diagnosis not present

## 2017-05-19 DIAGNOSIS — I129 Hypertensive chronic kidney disease with stage 1 through stage 4 chronic kidney disease, or unspecified chronic kidney disease: Secondary | ICD-10-CM | POA: Diagnosis not present

## 2017-05-19 DIAGNOSIS — N182 Chronic kidney disease, stage 2 (mild): Secondary | ICD-10-CM | POA: Diagnosis not present

## 2017-05-20 ENCOUNTER — Encounter (HOSPITAL_COMMUNITY)
Admission: RE | Admit: 2017-05-20 | Discharge: 2017-05-20 | Disposition: A | Discharge status: Home / Self Care | Payer: Medicare Other | Source: Ambulatory Visit | Attending: Nephrology | Admitting: Nephrology

## 2017-05-20 ENCOUNTER — Encounter (HOSPITAL_COMMUNITY): Payer: Self-pay

## 2017-05-20 DIAGNOSIS — E7521 Fabry (-Anderson) disease: Secondary | ICD-10-CM | POA: Insufficient documentation

## 2017-05-20 DIAGNOSIS — E1101 Type 2 diabetes mellitus with hyperosmolarity with coma: Secondary | ICD-10-CM | POA: Diagnosis not present

## 2017-05-20 LAB — URINALYSIS, ROUTINE W REFLEX MICROSCOPIC
Bacteria, UA: NONE SEEN
Bilirubin Urine: NEGATIVE
Glucose, UA: NEGATIVE mg/dL
Hgb urine dipstick: NEGATIVE
Ketones, ur: NEGATIVE mg/dL
Nitrite: NEGATIVE
Protein, ur: 30 mg/dL — AB
Specific Gravity, Urine: 1.008 (ref 1.005–1.030)
Squamous Epithelial / HPF: NONE SEEN
pH: 6 (ref 5.0–8.0)

## 2017-05-20 LAB — COMPREHENSIVE METABOLIC PANEL WITH GFR
ALT: 17 U/L (ref 17–63)
AST: 31 U/L (ref 15–41)
Albumin: 3 g/dL — ABNORMAL LOW (ref 3.5–5.0)
Alkaline Phosphatase: 72 U/L (ref 38–126)
Anion gap: 10 (ref 5–15)
BUN: 21 mg/dL — ABNORMAL HIGH (ref 6–20)
CO2: 26 mmol/L (ref 22–32)
Calcium: 8.8 mg/dL — ABNORMAL LOW (ref 8.9–10.3)
Chloride: 104 mmol/L (ref 101–111)
Creatinine, Ser: 1.51 mg/dL — ABNORMAL HIGH (ref 0.61–1.24)
GFR calc Af Amer: 58 mL/min — ABNORMAL LOW (ref >60)
GFR calc non Af Amer: 50 mL/min — ABNORMAL LOW (ref >60)
Glucose, Bld: 118 mg/dL — ABNORMAL HIGH (ref 65–99)
Potassium: 4 mmol/L (ref 3.5–5.1)
Sodium: 140 mmol/L (ref 135–145)
Total Bilirubin: 0.7 mg/dL (ref 0.3–1.2)
Total Protein: 7.5 g/dL (ref 6.5–8.1)

## 2017-05-20 LAB — PHOSPHORUS: Phosphorus: 4.3 mg/dL (ref 2.5–4.6)

## 2017-05-20 MED ORDER — SODIUM CHLORIDE 0.9 % IV SOLN
INTRAVENOUS | Status: DC
  Administered 2017-05-20: 08:00:00 via INTRAVENOUS

## 2017-05-20 MED ORDER — SODIUM CHLORIDE 0.9 % IV SOLN
115.0000 mg | INTRAVENOUS | Status: DC
  Administered 2017-05-20: 115 mg via INTRAVENOUS
  Filled 2017-05-20: qty 21

## 2017-05-20 NOTE — Discharge Instructions (Signed)
Fabry Disease °Fabry disease is a rare genetic disorder. A gene defect (mutation) alters a protein, called an enzyme, that normally breaks down a particular fat (globotriaosylceramide). The mutation keeps the enzyme from breaking down the fat. This causes excess fat to build up in the blood vessels (vasculature) of many organs of the body. Over time, this results in less blood flow and fewer nutrients getting to the body's organs. The buildup can also wear away at nerve fibers. That causes pain and other symptoms. The areas most often affected are the hands, feet, skin, eyes, and digestive system. Eventually the fatty deposits can affect your cardiovascular system, nervous system, and kidneys. This can cause a heart attack, stroke, kidney damage, or other life-threatening complications. °What are the causes? °Fabry disease is caused by a genetic mutation. This mutation is passed down through families (inherited). °What increases the risk? °You may be more likely to have Fabry disease if: °· You are male. °· You have a family history of the disease. ° °What are the signs or symptoms? °Symptoms of Fabry disease include: °· A rash of small, raised, reddish-purple dots on the skin (angiokeratomas). °· Cloudy eyes or other eye problems. °· Chronic pain, especially in the hands or feet. °· Burning sensations in the hands and feet. °· Gastrointestinal problems, such as bloating, diarrhea, and frequent bowel movements. °· Ringing in the ears (tinnitus). °· Hearing loss. °· Decreased sweat production (hypohidrosis) or no sweat production (anhidrosis). ° °Over time, untreated Fabry disease can cause: °· Severe back pain in the area of the kidneys. °· Heart attack. °· Stroke. ° °How is this diagnosed? °Your health care provider may suspect Fabry disease based on your symptoms and family history. Your health care provider will also perform a physical exam. This may include tests, such as: °· A blood test to measure enzyme  activity in your blood (enzyme assay). °· A blood test to look for mutations in the gene that causes Fabry disease. °· Urine tests to check how well your kidneys are working and to check for protein in your urine. °· An ultrasound of your heart (echocardiogram) to create an image of your heart and check for heart disease. ° °If you or your partner has Fabry disease and you are having a child, your child can be screened for the condition at birth. If the results of that test are out of the normal range, the child may need follow-up blood and urine tests. These would help confirm the diagnosis and help determine treatment. Prenatal testing for Fabry disease also can be done. °How is this treated? °Medicines are used to manage Fabry disease. This may include: °· Enzyme replacement therapy. This medicine performs the functions of the enzyme that is missing in Fabry disease. °· Medicines to treat the pain and discomfort in your hands and feet. ° °You may also need treatment for complications caused by Fabry Disease. These complications may be related to your kidneys or heart. Treatments may include: °· Angioplasty to repair or unblock an artery. °· Heart surgery. °· Dialysis to filter your kidneys. °· Kidney transplant. ° °Follow these instructions at home: °· Avoid stressful situations when possible. Stress can worsen the pain related to Fabry disease. °· Stay indoors when the temperatures are very hot or very cold. °· Exercise carefully, as directed by your health care provider. Physical exertion can make Fabry disease worse. °· Practice good hygiene to avoid illnesses that make Fabry disease worse. °· Have a good support system   in place. °Contact a health care provider if: °· You have worsening pain. °· You have worsening vision. °· You have a noticeable decrease in urination. °· You have swelling of your hands, feet, or lower legs that is getting worse. °· You feel increasingly weak or tired. °Get help right away  if: °· You have trouble breathing, chest pain, or a feeling of pressure in your chest. °· You have stroke symptoms. These include: °? Numbness or weakness on one side of your body. °? Droopiness on one side of your face. °? Slurred speech. °? Confusion. °· You have sharp pain in the area of your kidneys. °This information is not intended to replace advice given to you by your health care provider. Make sure you discuss any questions you have with your health care provider. °Document Released: 05/14/2002 Document Revised: 10/30/2015 Document Reviewed: 01/16/2014 °Elsevier Interactive Patient Education © 2018 Elsevier Inc. ° °

## 2017-06-03 ENCOUNTER — Encounter (HOSPITAL_COMMUNITY): Payer: Self-pay

## 2017-06-03 ENCOUNTER — Encounter (HOSPITAL_COMMUNITY)
Admission: RE | Admit: 2017-06-03 | Discharge: 2017-06-03 | Disposition: A | Discharge status: Home / Self Care | Payer: Medicare Other | Source: Ambulatory Visit | Attending: Nephrology | Admitting: Nephrology

## 2017-06-03 DIAGNOSIS — E1101 Type 2 diabetes mellitus with hyperosmolarity with coma: Secondary | ICD-10-CM | POA: Diagnosis not present

## 2017-06-03 DIAGNOSIS — E7521 Fabry (-Anderson) disease: Secondary | ICD-10-CM | POA: Diagnosis not present

## 2017-06-03 MED ORDER — SODIUM CHLORIDE 0.9 % IV SOLN
INTRAVENOUS | Status: DC
  Administered 2017-06-03: 08:00:00 via INTRAVENOUS

## 2017-06-03 MED ORDER — SODIUM CHLORIDE 0.9 % IV SOLN
115.0000 mg | INTRAVENOUS | Status: DC
  Administered 2017-06-03: 115 mg via INTRAVENOUS
  Filled 2017-06-03: qty 21

## 2017-06-03 NOTE — Discharge Instructions (Signed)
Fabry Disease °Fabry disease is a rare genetic disorder. A gene defect (mutation) alters a protein, called an enzyme, that normally breaks down a particular fat (globotriaosylceramide). The mutation keeps the enzyme from breaking down the fat. This causes excess fat to build up in the blood vessels (vasculature) of many organs of the body. Over time, this results in less blood flow and fewer nutrients getting to the body's organs. The buildup can also wear away at nerve fibers. That causes pain and other symptoms. The areas most often affected are the hands, feet, skin, eyes, and digestive system. Eventually the fatty deposits can affect your cardiovascular system, nervous system, and kidneys. This can cause a heart attack, stroke, kidney damage, or other life-threatening complications. °What are the causes? °Fabry disease is caused by a genetic mutation. This mutation is passed down through families (inherited). °What increases the risk? °You may be more likely to have Fabry disease if: °· You are male. °· You have a family history of the disease. ° °What are the signs or symptoms? °Symptoms of Fabry disease include: °· A rash of small, raised, reddish-purple dots on the skin (angiokeratomas). °· Cloudy eyes or other eye problems. °· Chronic pain, especially in the hands or feet. °· Burning sensations in the hands and feet. °· Gastrointestinal problems, such as bloating, diarrhea, and frequent bowel movements. °· Ringing in the ears (tinnitus). °· Hearing loss. °· Decreased sweat production (hypohidrosis) or no sweat production (anhidrosis). ° °Over time, untreated Fabry disease can cause: °· Severe back pain in the area of the kidneys. °· Heart attack. °· Stroke. ° °How is this diagnosed? °Your health care provider may suspect Fabry disease based on your symptoms and family history. Your health care provider will also perform a physical exam. This may include tests, such as: °· A blood test to measure enzyme  activity in your blood (enzyme assay). °· A blood test to look for mutations in the gene that causes Fabry disease. °· Urine tests to check how well your kidneys are working and to check for protein in your urine. °· An ultrasound of your heart (echocardiogram) to create an image of your heart and check for heart disease. ° °If you or your partner has Fabry disease and you are having a child, your child can be screened for the condition at birth. If the results of that test are out of the normal range, the child may need follow-up blood and urine tests. These would help confirm the diagnosis and help determine treatment. Prenatal testing for Fabry disease also can be done. °How is this treated? °Medicines are used to manage Fabry disease. This may include: °· Enzyme replacement therapy. This medicine performs the functions of the enzyme that is missing in Fabry disease. °· Medicines to treat the pain and discomfort in your hands and feet. ° °You may also need treatment for complications caused by Fabry Disease. These complications may be related to your kidneys or heart. Treatments may include: °· Angioplasty to repair or unblock an artery. °· Heart surgery. °· Dialysis to filter your kidneys. °· Kidney transplant. ° °Follow these instructions at home: °· Avoid stressful situations when possible. Stress can worsen the pain related to Fabry disease. °· Stay indoors when the temperatures are very hot or very cold. °· Exercise carefully, as directed by your health care provider. Physical exertion can make Fabry disease worse. °· Practice good hygiene to avoid illnesses that make Fabry disease worse. °· Have a good support system   in place. °Contact a health care provider if: °· You have worsening pain. °· You have worsening vision. °· You have a noticeable decrease in urination. °· You have swelling of your hands, feet, or lower legs that is getting worse. °· You feel increasingly weak or tired. °Get help right away  if: °· You have trouble breathing, chest pain, or a feeling of pressure in your chest. °· You have stroke symptoms. These include: °? Numbness or weakness on one side of your body. °? Droopiness on one side of your face. °? Slurred speech. °? Confusion. °· You have sharp pain in the area of your kidneys. °This information is not intended to replace advice given to you by your health care provider. Make sure you discuss any questions you have with your health care provider. °Document Released: 05/14/2002 Document Revised: 10/30/2015 Document Reviewed: 01/16/2014 °Elsevier Interactive Patient Education © 2018 Elsevier Inc. ° °

## 2017-06-13 NOTE — Progress Notes (Signed)
Electrophysiology Office Note Date: 06/15/2017  ID:  [name], DOB 06/21/1960, MRN [id number]  PCP: Ladell Pier, MD Primary Cardiologist: Tamala Julian Electrophysiologist: Allred  CC: Pacemaker follow-up  [name] is a 57 y.o. male seen today for Dr Rayann Heman.  He presents today for routine electrophysiology followup.  Since last being seen in our clinic, the patient reports doing reasonably well.  He has chronic dyspnea on exertion as well as LE edema. He has not noticed significant change recently.  He has had some wheezing lately that he thinks is related to not having the filters changed in his house.  He denies chest pain, palpitations, PND, orthopnea, nausea, vomiting, dizziness, syncope, weight gain, or early satiety.  Device History: STJ dual chamber PPM implanted 2012 for Mobitz II   Past Medical History:  Diagnosis Date  . Bell's palsy   . Bifascicular block   . Chronic bronchitis ([place])    "seasonal; get it q yr"  . Chronic diastolic CHF (congestive heart failure) ([place]) 2010  . Chronic kidney disease (CKD), stage II (mild)    stage II to III/notes 07/23/2014  . Diastolic heart failure secondary to hypertrophic cardiomyopathy ([place]) CARDIOLOGIST-- DR Daneen Schick  . Dyspnea    increased exertion   . Edema 07/2013  . Fabry's disease ([place]) RENAL AND CARDIAC INVOVLEMENT   FOLLOWED DR [place]  . Gout, arthritis 2014   bil feet. right worse  . History of cellulitis of skin with lymphangitis LEFT LEG  . HOCM (hypertrophic obstructive cardiomyopathy) ([place])    a. Echo 11/16: Severe LVH, EF 55-60%, abnormal GLS consistent with HOCM, no SAM, mild LAE  . Hypertension 20 years  . Lymphedema of lower extremity LEFT  >  RIGHT   "using ankle high socks at home; pump doesn't work for me" (07/23/2014)  . Pacemaker    02/12/11  . Pneumonia 08/2011  . Seasonal asthma NO INHALERS   30 years  . Second degree Mobitz II AV block    with syncope, s/p PPM  . Short of breath  on exertion   . Type II diabetes mellitus ([place]) 2012   INSULIN DEPENDENT   Past Surgical History:  Procedure Laterality Date  . CARDIAC CATHETERIZATION  03-12-2011  DR Daneen Schick   HYPERTROPHIC CARDIOMYOPATHY WITH LV CAVITY  APPEARANCE CONSISTANT WITH SIGNIFICANT APICAL HYPERTROPHY/ NORMAL LVSF / EF 55%/ EVIDENCE OF DIASTOLIC DYSFUNCTION WITH EDP OF 23-37mmHg AFTER A-WAVE/ NORMAL CORONARY ARTERIES  . EVALUATION UNDER ANESTHESIA WITH FISTULECTOMY N/A 06/23/2015   Procedure: EXAM UNDER ANESTHESIA WITH POSSIBLE FISTULOTOMY;  Surgeon: Judeth Horn, MD;  Location: [place];  Service: General;  Laterality: N/A;  . HERNIA REPAIR  [id number]   Umbilical Hernia Repair  . INCISION AND DRAINAGE PERIRECTAL ABSCESS Left 07/29/2014   Procedure: IRRIGATION AND DEBRIDEMENT PERIRECTAL ABSCESS;  Surgeon: Doreen Salvage, MD;  Location: [place];  Service: General;  Laterality: Left;  Prone position  . INSERT / REPLACE / REMOVE PACEMAKER  02/12/2011   SJM implanted by Dr Rayann Heman for Mobitz II second degree AV block and syncope  . IRRIGATION AND DEBRIDEMENT ABSCESS N/A 07/27/2013   Procedure: IRRIGATION AND DEBRIDEMENT OF SKIN, SOFT TISSUE AND MUSCLES OF UPPER BACK (11X19X4cm) WITH 10 BLADE AND PULSATILE LAVAGE ;  Surgeon: Gayland Curry, MD;  Location: [place];  Service: General;  Laterality: N/A;  . UMBILICAL HERNIA REPAIR  03/22/2012   Procedure: HERNIA REPAIR UMBILICAL ADULT;  Surgeon: Leighton Ruff, MD;  [facility];  Service: General;  Laterality: N/A;  Umbilical Hernia Repair     Current Outpatient Medications  Medication Sig Dispense Refill  . ACCU-CHEK AVIVA PLUS test strip TEST 3 TIMES DAILY. ICD 10 E11.8 100 each 12  . ACCU-CHEK FASTCLIX LANCETS MISC USE TO TEST BLOOD SUGAR 3 TO 4 TIMES DAILY 102 each 5  . acetaminophen (TYLENOL) 500 MG tablet Take 500 mg by mouth every 14 (fourteen) days. Only taking 1 hour before infusion.  AS premed for Fabrazyme    . Agalsidase beta (FABRAZYME IV) Inject 115 mg into the vein every  14 (fourteen) days.     . ALBUTEROL SULFATE HFA IN Inhale 1 puff into the lungs daily as needed (shortness of breath or wheezing). For shortness of breath    . allopurinol (ZYLOPRIM) 300 MG tablet Take 1 tablet (300 mg total) by mouth daily. 30 tablet 5  . aspirin 81 MG tablet Take 81 mg by mouth daily.     [name] Kitchen atorvastatin (LIPITOR) 20 MG tablet Take 1 tablet (20 mg total) by mouth daily. 30 tablet 5  . fexofenadine (ALLEGRA) 180 MG tablet Take 180 mg by mouth every 14 (fourteen) days.     . fluticasone (FLONASE) 50 MCG/ACT nasal spray Place 1 spray into both nostrils daily. 16 g 1  . furosemide (LASIX) 20 MG tablet Take 20 mg by mouth as needed. Lower extremiity edema    . Insulin Lispro Prot & Lispro (HUMALOG 75/25 MIX) (75-25) 100 UNIT/ML Kwikpen Inject 20 Units into the skin 2 (two) times daily. 15 mL 11  . Insulin Pen Needle (PX SHORTLENGTH PEN NEEDLES) 31G X 8 MM MISC 1 each by Does not apply route 2 (two) times daily before a meal. 100 each 8  . losartan-hydrochlorothiazide (HYZAAR) 50-12.5 MG tablet Take 1 tablet by mouth daily. 30 tablet 5  . OLIVE LEAF PO Take 1 tablet by mouth 2 (two) times daily.      No current facility-administered medications for this visit.     Allergies:   Shellfish allergy   Social History: Social History   Socioeconomic History  . Marital status: Divorced    Spouse name: Not on file  . Number of children: Not on file  . Years of education: Not on file  . Highest education level: Not on file  Social Needs  . Financial resource strain: Not on file  . Food insecurity - worry: Not on file  . Food insecurity - inability: Not on file  . Transportation needs - medical: Not on file  . Transportation needs - non-medical: Not on file  Occupational History  . Not on file  Tobacco Use  . Smoking status: Never Smoker  . Smokeless tobacco: Never Used  Substance and Sexual Activity  . Alcohol use: Yes    Comment: 07/23/2014 "might have a few drinks on  holidays or at cookouts"  . Drug use: No  . Sexual activity: Yes  Other Topics Concern  . 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 Stroke Mother   . Colon cancer Neg Hx      Review of Systems: All other systems reviewed and are otherwise negative except as noted above.   Physical Exam: VS:  BP (!) 142/100   Pulse 79   Ht 5\' 10"  (1.778 m)   Wt 294 lb (133.4 kg)   BMI 42.18 kg/m  , BMI Body mass  index is 42.18 kg/m.  GEN- The patient is obese appearing, alert and oriented x 3 today.   HEENT: normocephalic, atraumatic; sclera clear, conjunctiva pink; hearing intact; oropharynx clear; neck supple Lungs- Clear to ausculation bilaterally, normal work of breathing.  No wheezes, rales, rhonchi Heart- Regular rate and rhythm (paced) GI- soft, non-tender, non-distended, bowel sounds present Extremities- no clubbing, cyanosis, +lymphedema MS- no significant deformity or atrophy Skin- warm and dry, no rash or lesion; PPM pocket well healed Psych- euthymic mood, full affect Neuro- strength and sensation are intact  PPM Interrogation- reviewed in detail today,  See PACEART report  EKG:  EKG is not ordered today.  Recent Labs: 04/15/2017: Hemoglobin 14.8; Platelets 211 05/20/2017: ALT 17; BUN 21; Creatinine, Ser 1.51; Potassium 4.0; Sodium 140   Wt Readings from Last 3 Encounters:  06/15/17 294 lb (133.4 kg)  06/03/17 292 lb (132.5 kg)  05/20/17 295 lb (133.8 kg)     Other studies Reviewed: Additional studies/ records that were reviewed today include: Dr Rayann Heman and Trinidad Curet notes, echo  Assessment and Plan:  1.  Mobitz II heart block Normal PPM function - pt is dependent today See Pace Art report No changes today Merlin box not working - will discuss with local STJ rep to see if we can help troubleshoot  2.  Shortness of breath Stable No change required today  3.   Chronic diastolic heart failure Euvolemic on exam Continue current medications  4.  HTN BP elevated today He thinks it is because he is at the doctor's office I have advised to get a blood pressure cuff at home to monitor pressure and take log to PCP at appt in a couple of weeks    Current medicines are reviewed at length with the patient today.   The patient does not have concerns regarding his medicines.  The following changes were made today:  none  Labs/ tests ordered today include: none   Disposition:   Follow up with Merlin transmissions, Dr Rayann Heman in 1 year, Dr Tamala Julian as scheduled   Signed, Chanetta Marshall, NP 06/15/2017 6:14 PM  [place] [address] [phone] (office) [phone] (fax)

## 2017-06-15 ENCOUNTER — Ambulatory Visit: Payer: Medicare Other | Admitting: Nurse Practitioner

## 2017-06-15 VITALS — BP 142/100 | HR 79 | Ht 70.0 in | Wt 294.0 lb

## 2017-06-15 DIAGNOSIS — I441 Atrioventricular block, second degree: Secondary | ICD-10-CM | POA: Diagnosis not present

## 2017-06-15 DIAGNOSIS — I5032 Chronic diastolic (congestive) heart failure: Secondary | ICD-10-CM | POA: Diagnosis not present

## 2017-06-15 DIAGNOSIS — I10 Essential (primary) hypertension: Secondary | ICD-10-CM

## 2017-06-15 NOTE — Patient Instructions (Addendum)
Medication Instructions:   Your physician recommends that you continue on your current medications as directed. Please refer to the Current Medication list given to you today.  If you need a refill on your cardiac medications before your next appointment, please call your pharmacy.  Labwork: NONE ORDERED  TODAY   Testing/Procedures:  NONE ORDERED  TODAY   Follow-Up:  Your physician wants you to follow-up in: [place] will receive a reminder letter in the mail two months in advance. If you don't receive a letter, please call our office to schedule the follow-up appointment.  Remote monitoring is used to monitor your Pacemaker of ICD from home. This monitoring reduces the number of office visits required to check your device to one time per year. It allows [place] to keep an eye on the functioning of your device to ensure it is working properly. You are scheduled for a device check from home on . 09-14-17  You may send your transmission at any time that day. If you have a wireless device, the transmission will be sent automatically. After your physician reviews your transmission, you will receive a postcard with your next transmission date.  AS SCHEDULED WITH DR Tamala Julian IN December.  REMINDER LETTER WILL BE SENT OUT.   Any Other Special Instructions Will Be Listed Below (If Applicable).

## 2017-06-17 ENCOUNTER — Encounter (HOSPITAL_COMMUNITY)
Admission: RE | Admit: 2017-06-17 | Discharge: 2017-06-17 | Disposition: A | Discharge status: Home / Self Care | Payer: Medicare Other | Source: Ambulatory Visit | Attending: Nephrology | Admitting: Nephrology

## 2017-06-17 DIAGNOSIS — E1101 Type 2 diabetes mellitus with hyperosmolarity with coma: Secondary | ICD-10-CM | POA: Diagnosis not present

## 2017-06-17 DIAGNOSIS — E7521 Fabry (-Anderson) disease: Secondary | ICD-10-CM | POA: Insufficient documentation

## 2017-06-17 LAB — COMPREHENSIVE METABOLIC PANEL
ALT: 22 U/L (ref 17–63)
AST: 43 U/L — ABNORMAL HIGH (ref 15–41)
Albumin: 3 g/dL — ABNORMAL LOW (ref 3.5–5.0)
Alkaline Phosphatase: 73 U/L (ref 38–126)
Anion gap: 7 (ref 5–15)
BUN: 22 mg/dL — ABNORMAL HIGH (ref 6–20)
CO2: 24 mmol/L (ref 22–32)
Calcium: 8.8 mg/dL — ABNORMAL LOW (ref 8.9–10.3)
Chloride: 108 mmol/L (ref 101–111)
Creatinine, Ser: 1.58 mg/dL — ABNORMAL HIGH (ref 0.61–1.24)
GFR calc Af Amer: 55 mL/min — ABNORMAL LOW (ref >60)
GFR calc non Af Amer: 47 mL/min — ABNORMAL LOW (ref >60)
Glucose, Bld: 218 mg/dL — ABNORMAL HIGH (ref 65–99)
Potassium: 5.9 mmol/L — ABNORMAL HIGH (ref 3.5–5.1)
Sodium: 139 mmol/L (ref 135–145)
Total Bilirubin: 1.3 mg/dL — ABNORMAL HIGH (ref 0.3–1.2)
Total Protein: 7.7 g/dL (ref 6.5–8.1)

## 2017-06-17 LAB — URINALYSIS, ROUTINE W REFLEX MICROSCOPIC
Bacteria, UA: NONE SEEN
Bilirubin Urine: NEGATIVE
Glucose, UA: NEGATIVE mg/dL
Hgb urine dipstick: NEGATIVE
Ketones, ur: NEGATIVE mg/dL
Nitrite: POSITIVE — AB
Protein, ur: 100 mg/dL — AB
Specific Gravity, Urine: 1.01 (ref 1.005–1.030)
Squamous Epithelial / LPF: NONE SEEN
pH: 6 (ref 5.0–8.0)

## 2017-06-17 LAB — PHOSPHORUS: Phosphorus: 4 mg/dL (ref 2.5–4.6)

## 2017-06-17 MED ORDER — SODIUM CHLORIDE 0.9 % IV SOLN
INTRAVENOUS | Status: DC
  Administered 2017-06-17: 10:00:00 via INTRAVENOUS

## 2017-06-17 MED ORDER — AGALSIDASE BETA 5 MG IV SOLR
115.0000 mg | INTRAVENOUS | Status: DC
  Administered 2017-06-17: 115 mg via INTRAVENOUS
  Filled 2017-06-17: qty 21

## 2017-06-29 LAB — CUP PACEART INCLINIC DEVICE CHECK
Date Time Interrogation Session: 20190123084155
Implantable Lead Implant Date: 20120907
Implantable Lead Implant Date: 20120907
Implantable Lead Location: 753859
Implantable Lead Location: 753860
Implantable Pulse Generator Implant Date: 20120907
Pulse Gen Model: 2210
Pulse Gen Serial Number: 7267950

## 2017-07-01 ENCOUNTER — Encounter (HOSPITAL_COMMUNITY): Payer: Self-pay

## 2017-07-01 ENCOUNTER — Encounter (HOSPITAL_COMMUNITY)
Admission: RE | Admit: 2017-07-01 | Discharge: 2017-07-01 | Disposition: A | Discharge status: Home / Self Care | Payer: Medicare Other | Source: Ambulatory Visit | Attending: Nephrology | Admitting: Nephrology

## 2017-07-01 DIAGNOSIS — E1101 Type 2 diabetes mellitus with hyperosmolarity with coma: Secondary | ICD-10-CM | POA: Diagnosis not present

## 2017-07-01 DIAGNOSIS — E7521 Fabry (-Anderson) disease: Secondary | ICD-10-CM | POA: Diagnosis not present

## 2017-07-01 MED ORDER — SODIUM CHLORIDE 0.9 % IV SOLN
115.0000 mg | INTRAVENOUS | Status: DC
  Administered 2017-07-01: 115 mg via INTRAVENOUS
  Filled 2017-07-01: qty 21

## 2017-07-01 MED ORDER — SODIUM CHLORIDE 0.9 % IV SOLN
INTRAVENOUS | Status: DC
  Administered 2017-07-01: 08:00:00 via INTRAVENOUS

## 2017-07-01 NOTE — Discharge Instructions (Signed)
Fabry Disease °Fabry disease is a rare genetic disorder. A gene defect (mutation) alters a protein, called an enzyme, that normally breaks down a particular fat (globotriaosylceramide). The mutation keeps the enzyme from breaking down the fat. This causes excess fat to build up in the blood vessels (vasculature) of many organs of the body. Over time, this results in less blood flow and fewer nutrients getting to the body's organs. The buildup can also wear away at nerve fibers. That causes pain and other symptoms. The areas most often affected are the hands, feet, skin, eyes, and digestive system. Eventually the fatty deposits can affect your cardiovascular system, nervous system, and kidneys. This can cause a heart attack, stroke, kidney damage, or other life-threatening complications. °What are the causes? °Fabry disease is caused by a genetic mutation. This mutation is passed down through families (inherited). °What increases the risk? °You may be more likely to have Fabry disease if: °· You are male. °· You have a family history of the disease. ° °What are the signs or symptoms? °Symptoms of Fabry disease include: °· A rash of small, raised, reddish-purple dots on the skin (angiokeratomas). °· Cloudy eyes or other eye problems. °· Chronic pain, especially in the hands or feet. °· Burning sensations in the hands and feet. °· Gastrointestinal problems, such as bloating, diarrhea, and frequent bowel movements. °· Ringing in the ears (tinnitus). °· Hearing loss. °· Decreased sweat production (hypohidrosis) or no sweat production (anhidrosis). ° °Over time, untreated Fabry disease can cause: °· Severe back pain in the area of the kidneys. °· Heart attack. °· Stroke. ° °How is this diagnosed? °Your health care provider may suspect Fabry disease based on your symptoms and family history. Your health care provider will also perform a physical exam. This may include tests, such as: °· A blood test to measure enzyme  activity in your blood (enzyme assay). °· A blood test to look for mutations in the gene that causes Fabry disease. °· Urine tests to check how well your kidneys are working and to check for protein in your urine. °· An ultrasound of your heart (echocardiogram) to create an image of your heart and check for heart disease. ° °If you or your partner has Fabry disease and you are having a child, your child can be screened for the condition at birth. If the results of that test are out of the normal range, the child may need follow-up blood and urine tests. These would help confirm the diagnosis and help determine treatment. Prenatal testing for Fabry disease also can be done. °How is this treated? °Medicines are used to manage Fabry disease. This may include: °· Enzyme replacement therapy. This medicine performs the functions of the enzyme that is missing in Fabry disease. °· Medicines to treat the pain and discomfort in your hands and feet. ° °You may also need treatment for complications caused by Fabry Disease. These complications may be related to your kidneys or heart. Treatments may include: °· Angioplasty to repair or unblock an artery. °· Heart surgery. °· Dialysis to filter your kidneys. °· Kidney transplant. ° °Follow these instructions at home: °· Avoid stressful situations when possible. Stress can worsen the pain related to Fabry disease. °· Stay indoors when the temperatures are very hot or very cold. °· Exercise carefully, as directed by your health care provider. Physical exertion can make Fabry disease worse. °· Practice good hygiene to avoid illnesses that make Fabry disease worse. °· Have a good support system   in place. °Contact a health care provider if: °· You have worsening pain. °· You have worsening vision. °· You have a noticeable decrease in urination. °· You have swelling of your hands, feet, or lower legs that is getting worse. °· You feel increasingly weak or tired. °Get help right away  if: °· You have trouble breathing, chest pain, or a feeling of pressure in your chest. °· You have stroke symptoms. These include: °? Numbness or weakness on one side of your body. °? Droopiness on one side of your face. °? Slurred speech. °? Confusion. °· You have sharp pain in the area of your kidneys. °This information is not intended to replace advice given to you by your health care provider. Make sure you discuss any questions you have with your health care provider. °Document Released: 05/14/2002 Document Revised: 10/30/2015 Document Reviewed: 01/16/2014 °Elsevier Interactive Patient Education © 2018 Elsevier Inc. ° °

## 2017-07-02 ENCOUNTER — Other Ambulatory Visit: Payer: Self-pay | Admitting: Nephrology

## 2017-07-02 DIAGNOSIS — E7521 Fabry (-Anderson) disease: Secondary | ICD-10-CM

## 2017-07-15 ENCOUNTER — Encounter (HOSPITAL_COMMUNITY)
Admission: RE | Admit: 2017-07-15 | Discharge: 2017-07-15 | Disposition: A | Discharge status: Home / Self Care | Payer: Medicare Other | Source: Ambulatory Visit | Attending: Nephrology | Admitting: Nephrology

## 2017-07-15 ENCOUNTER — Encounter (HOSPITAL_COMMUNITY): Payer: Self-pay

## 2017-07-15 DIAGNOSIS — E1101 Type 2 diabetes mellitus with hyperosmolarity with coma: Secondary | ICD-10-CM | POA: Insufficient documentation

## 2017-07-15 DIAGNOSIS — E7521 Fabry (-Anderson) disease: Secondary | ICD-10-CM | POA: Diagnosis not present

## 2017-07-15 LAB — URINALYSIS, ROUTINE W REFLEX MICROSCOPIC
Bilirubin Urine: NEGATIVE
Glucose, UA: NEGATIVE mg/dL
Hgb urine dipstick: NEGATIVE
Ketones, ur: NEGATIVE mg/dL
Nitrite: POSITIVE — AB
Protein, ur: 100 mg/dL — AB
Specific Gravity, Urine: 1.009 (ref 1.005–1.030)
Squamous Epithelial / LPF: NONE SEEN
pH: 6 (ref 5.0–8.0)

## 2017-07-15 LAB — COMPREHENSIVE METABOLIC PANEL
ALT: 20 U/L (ref 17–63)
AST: 29 U/L (ref 15–41)
Albumin: 3 g/dL — ABNORMAL LOW (ref 3.5–5.0)
Alkaline Phosphatase: 65 U/L (ref 38–126)
Anion gap: 9 (ref 5–15)
BUN: 26 mg/dL — ABNORMAL HIGH (ref 6–20)
CO2: 25 mmol/L (ref 22–32)
Calcium: 9.1 mg/dL (ref 8.9–10.3)
Chloride: 107 mmol/L (ref 101–111)
Creatinine, Ser: 1.65 mg/dL — ABNORMAL HIGH (ref 0.61–1.24)
GFR calc Af Amer: 52 mL/min — ABNORMAL LOW (ref >60)
GFR calc non Af Amer: 45 mL/min — ABNORMAL LOW (ref >60)
Glucose, Bld: 97 mg/dL (ref 65–99)
Potassium: 3.8 mmol/L (ref 3.5–5.1)
Sodium: 141 mmol/L (ref 135–145)
Total Bilirubin: 0.5 mg/dL (ref 0.3–1.2)
Total Protein: 7.2 g/dL (ref 6.5–8.1)

## 2017-07-15 LAB — LIPID PANEL
Cholesterol: 263 mg/dL — ABNORMAL HIGH (ref 0–200)
HDL: 52 mg/dL (ref >40)
LDL Cholesterol: 189 mg/dL — ABNORMAL HIGH (ref 0–99)
Total CHOL/HDL Ratio: 5.1 RATIO
Triglycerides: 110 mg/dL (ref <150)
VLDL: 22 mg/dL (ref 0–40)

## 2017-07-15 LAB — CBC WITH DIFFERENTIAL/PLATELET
Basophils Absolute: 0 10*3/uL (ref 0.0–0.1)
Basophils Relative: 0 %
Eosinophils Absolute: 0.3 10*3/uL (ref 0.0–0.7)
Eosinophils Relative: 4 %
HCT: 44 % (ref 39.0–52.0)
Hemoglobin: 14.9 g/dL (ref 13.0–17.0)
Lymphocytes Relative: 42 %
Lymphs Abs: 3.1 10*3/uL (ref 0.7–4.0)
MCH: 30.3 pg (ref 26.0–34.0)
MCHC: 33.9 g/dL (ref 30.0–36.0)
MCV: 89.4 fL (ref 78.0–100.0)
Monocytes Absolute: 0.6 10*3/uL (ref 0.1–1.0)
Monocytes Relative: 9 %
Neutro Abs: 3.3 10*3/uL (ref 1.7–7.7)
Neutrophils Relative %: 45 %
Platelets: 210 10*3/uL (ref 150–400)
RBC: 4.92 MIL/uL (ref 4.22–5.81)
RDW: 13.1 % (ref 11.5–15.5)
WBC: 7.4 10*3/uL (ref 4.0–10.5)

## 2017-07-15 LAB — IRON AND TIBC
Iron: 73 ug/dL (ref 45–182)
Saturation Ratios: 25 % (ref 17.9–39.5)
TIBC: 294 ug/dL (ref 250–450)
UIBC: 221 ug/dL

## 2017-07-15 LAB — FERRITIN: Ferritin: 112 ng/mL (ref 24–336)

## 2017-07-15 LAB — PHOSPHORUS: Phosphorus: 4.3 mg/dL (ref 2.5–4.6)

## 2017-07-15 MED ORDER — SODIUM CHLORIDE 0.9 % IV SOLN
INTRAVENOUS | Status: DC
  Administered 2017-07-15: 08:00:00 via INTRAVENOUS

## 2017-07-15 MED ORDER — AGALSIDASE BETA 5 MG IV SOLR
115.0000 mg | INTRAVENOUS | Status: DC
  Administered 2017-07-15: 115 mg via INTRAVENOUS
  Filled 2017-07-15: qty 21

## 2017-07-15 NOTE — Discharge Instructions (Signed)
Fabry Disease °Fabry disease is a rare genetic disorder. A gene defect (mutation) alters a protein, called an enzyme, that normally breaks down a particular fat (globotriaosylceramide). The mutation keeps the enzyme from breaking down the fat. This causes excess fat to build up in the blood vessels (vasculature) of many organs of the body. Over time, this results in less blood flow and fewer nutrients getting to the body's organs. The buildup can also wear away at nerve fibers. That causes pain and other symptoms. The areas most often affected are the hands, feet, skin, eyes, and digestive system. Eventually the fatty deposits can affect your cardiovascular system, nervous system, and kidneys. This can cause a heart attack, stroke, kidney damage, or other life-threatening complications. °What are the causes? °Fabry disease is caused by a genetic mutation. This mutation is passed down through families (inherited). °What increases the risk? °You may be more likely to have Fabry disease if: °· You are male. °· You have a family history of the disease. ° °What are the signs or symptoms? °Symptoms of Fabry disease include: °· A rash of small, raised, reddish-purple dots on the skin (angiokeratomas). °· Cloudy eyes or other eye problems. °· Chronic pain, especially in the hands or feet. °· Burning sensations in the hands and feet. °· Gastrointestinal problems, such as bloating, diarrhea, and frequent bowel movements. °· Ringing in the ears (tinnitus). °· Hearing loss. °· Decreased sweat production (hypohidrosis) or no sweat production (anhidrosis). ° °Over time, untreated Fabry disease can cause: °· Severe back pain in the area of the kidneys. °· Heart attack. °· Stroke. ° °How is this diagnosed? °Your health care provider may suspect Fabry disease based on your symptoms and family history. Your health care provider will also perform a physical exam. This may include tests, such as: °· A blood test to measure enzyme  activity in your blood (enzyme assay). °· A blood test to look for mutations in the gene that causes Fabry disease. °· Urine tests to check how well your kidneys are working and to check for protein in your urine. °· An ultrasound of your heart (echocardiogram) to create an image of your heart and check for heart disease. ° °If you or your partner has Fabry disease and you are having a child, your child can be screened for the condition at birth. If the results of that test are out of the normal range, the child may need follow-up blood and urine tests. These would help confirm the diagnosis and help determine treatment. Prenatal testing for Fabry disease also can be done. °How is this treated? °Medicines are used to manage Fabry disease. This may include: °· Enzyme replacement therapy. This medicine performs the functions of the enzyme that is missing in Fabry disease. °· Medicines to treat the pain and discomfort in your hands and feet. ° °You may also need treatment for complications caused by Fabry Disease. These complications may be related to your kidneys or heart. Treatments may include: °· Angioplasty to repair or unblock an artery. °· Heart surgery. °· Dialysis to filter your kidneys. °· Kidney transplant. ° °Follow these instructions at home: °· Avoid stressful situations when possible. Stress can worsen the pain related to Fabry disease. °· Stay indoors when the temperatures are very hot or very cold. °· Exercise carefully, as directed by your health care provider. Physical exertion can make Fabry disease worse. °· Practice good hygiene to avoid illnesses that make Fabry disease worse. °· Have a good support system   in place. °Contact a health care provider if: °· You have worsening pain. °· You have worsening vision. °· You have a noticeable decrease in urination. °· You have swelling of your hands, feet, or lower legs that is getting worse. °· You feel increasingly weak or tired. °Get help right away  if: °· You have trouble breathing, chest pain, or a feeling of pressure in your chest. °· You have stroke symptoms. These include: °? Numbness or weakness on one side of your body. °? Droopiness on one side of your face. °? Slurred speech. °? Confusion. °· You have sharp pain in the area of your kidneys. °This information is not intended to replace advice given to you by your health care provider. Make sure you discuss any questions you have with your health care provider. °Document Released: 05/14/2002 Document Revised: 10/30/2015 Document Reviewed: 01/16/2014 °Elsevier Interactive Patient Education © 2018 Elsevier Inc. ° °

## 2017-07-29 ENCOUNTER — Encounter (HOSPITAL_COMMUNITY)
Admission: RE | Admit: 2017-07-29 | Discharge: 2017-07-29 | Disposition: A | Discharge status: Home / Self Care | Payer: Medicare Other | Source: Ambulatory Visit | Attending: Nephrology | Admitting: Nephrology

## 2017-07-29 ENCOUNTER — Encounter (HOSPITAL_COMMUNITY): Payer: Self-pay

## 2017-07-29 DIAGNOSIS — E1101 Type 2 diabetes mellitus with hyperosmolarity with coma: Secondary | ICD-10-CM | POA: Diagnosis not present

## 2017-07-29 DIAGNOSIS — E7521 Fabry (-Anderson) disease: Secondary | ICD-10-CM | POA: Diagnosis not present

## 2017-07-29 MED ORDER — SODIUM CHLORIDE 0.9 % IV SOLN
115.0000 mg | INTRAVENOUS | Status: DC
  Administered 2017-07-29: 115 mg via INTRAVENOUS
  Filled 2017-07-29: qty 2

## 2017-07-29 MED ORDER — SODIUM CHLORIDE 0.9 % IV SOLN
INTRAVENOUS | Status: DC
  Administered 2017-07-29: 07:00:00 via INTRAVENOUS

## 2017-07-29 NOTE — Discharge Instructions (Signed)
Fabry Disease °Fabry disease is a rare genetic disorder. A gene defect (mutation) alters a protein, called an enzyme, that normally breaks down a particular fat (globotriaosylceramide). The mutation keeps the enzyme from breaking down the fat. This causes excess fat to build up in the blood vessels (vasculature) of many organs of the body. Over time, this results in less blood flow and fewer nutrients getting to the body's organs. The buildup can also wear away at nerve fibers. That causes pain and other symptoms. The areas most often affected are the hands, feet, skin, eyes, and digestive system. Eventually the fatty deposits can affect your cardiovascular system, nervous system, and kidneys. This can cause a heart attack, stroke, kidney damage, or other life-threatening complications. °What are the causes? °Fabry disease is caused by a genetic mutation. This mutation is passed down through families (inherited). °What increases the risk? °You may be more likely to have Fabry disease if: °· You are male. °· You have a family history of the disease. ° °What are the signs or symptoms? °Symptoms of Fabry disease include: °· A rash of small, raised, reddish-purple dots on the skin (angiokeratomas). °· Cloudy eyes or other eye problems. °· Chronic pain, especially in the hands or feet. °· Burning sensations in the hands and feet. °· Gastrointestinal problems, such as bloating, diarrhea, and frequent bowel movements. °· Ringing in the ears (tinnitus). °· Hearing loss. °· Decreased sweat production (hypohidrosis) or no sweat production (anhidrosis). ° °Over time, untreated Fabry disease can cause: °· Severe back pain in the area of the kidneys. °· Heart attack. °· Stroke. ° °How is this diagnosed? °Your health care provider may suspect Fabry disease based on your symptoms and family history. Your health care provider will also perform a physical exam. This may include tests, such as: °· A blood test to measure enzyme  activity in your blood (enzyme assay). °· A blood test to look for mutations in the gene that causes Fabry disease. °· Urine tests to check how well your kidneys are working and to check for protein in your urine. °· An ultrasound of your heart (echocardiogram) to create an image of your heart and check for heart disease. ° °If you or your partner has Fabry disease and you are having a child, your child can be screened for the condition at birth. If the results of that test are out of the normal range, the child may need follow-up blood and urine tests. These would help confirm the diagnosis and help determine treatment. Prenatal testing for Fabry disease also can be done. °How is this treated? °Medicines are used to manage Fabry disease. This may include: °· Enzyme replacement therapy. This medicine performs the functions of the enzyme that is missing in Fabry disease. °· Medicines to treat the pain and discomfort in your hands and feet. ° °You may also need treatment for complications caused by Fabry Disease. These complications may be related to your kidneys or heart. Treatments may include: °· Angioplasty to repair or unblock an artery. °· Heart surgery. °· Dialysis to filter your kidneys. °· Kidney transplant. ° °Follow these instructions at home: °· Avoid stressful situations when possible. Stress can worsen the pain related to Fabry disease. °· Stay indoors when the temperatures are very hot or very cold. °· Exercise carefully, as directed by your health care provider. Physical exertion can make Fabry disease worse. °· Practice good hygiene to avoid illnesses that make Fabry disease worse. °· Have a good support system   in place. °Contact a health care provider if: °· You have worsening pain. °· You have worsening vision. °· You have a noticeable decrease in urination. °· You have swelling of your hands, feet, or lower legs that is getting worse. °· You feel increasingly weak or tired. °Get help right away  if: °· You have trouble breathing, chest pain, or a feeling of pressure in your chest. °· You have stroke symptoms. These include: °? Numbness or weakness on one side of your body. °? Droopiness on one side of your face. °? Slurred speech. °? Confusion. °· You have sharp pain in the area of your kidneys. °This information is not intended to replace advice given to you by your health care provider. Make sure you discuss any questions you have with your health care provider. °Document Released: 05/14/2002 Document Revised: 10/30/2015 Document Reviewed: 01/16/2014 °Elsevier Interactive Patient Education © 2018 Elsevier Inc. ° °

## 2017-08-11 ENCOUNTER — Other Ambulatory Visit (HOSPITAL_COMMUNITY): Payer: Self-pay | Admitting: *Deleted

## 2017-08-11 ENCOUNTER — Encounter: Payer: Self-pay | Admitting: Internal Medicine

## 2017-08-11 ENCOUNTER — Ambulatory Visit: Payer: Medicare Other | Attending: Internal Medicine | Admitting: Internal Medicine

## 2017-08-11 VITALS — BP 150/90 | HR 62 | Temp 98.3°F | Resp 16 | Ht 70.0 in | Wt 297.0 lb

## 2017-08-11 DIAGNOSIS — Z79899 Other long term (current) drug therapy: Secondary | ICD-10-CM | POA: Insufficient documentation

## 2017-08-11 DIAGNOSIS — M1990 Unspecified osteoarthritis, unspecified site: Secondary | ICD-10-CM | POA: Insufficient documentation

## 2017-08-11 DIAGNOSIS — I10 Essential (primary) hypertension: Secondary | ICD-10-CM | POA: Diagnosis not present

## 2017-08-11 DIAGNOSIS — Z823 Family history of stroke: Secondary | ICD-10-CM | POA: Diagnosis not present

## 2017-08-11 DIAGNOSIS — I421 Obstructive hypertrophic cardiomyopathy: Secondary | ICD-10-CM | POA: Diagnosis not present

## 2017-08-11 DIAGNOSIS — J449 Chronic obstructive pulmonary disease, unspecified: Secondary | ICD-10-CM | POA: Insufficient documentation

## 2017-08-11 DIAGNOSIS — E1122 Type 2 diabetes mellitus with diabetic chronic kidney disease: Secondary | ICD-10-CM | POA: Insufficient documentation

## 2017-08-11 DIAGNOSIS — D509 Iron deficiency anemia, unspecified: Secondary | ICD-10-CM | POA: Insufficient documentation

## 2017-08-11 DIAGNOSIS — I5032 Chronic diastolic (congestive) heart failure: Secondary | ICD-10-CM | POA: Insufficient documentation

## 2017-08-11 DIAGNOSIS — Z91013 Allergy to seafood: Secondary | ICD-10-CM | POA: Diagnosis not present

## 2017-08-11 DIAGNOSIS — L309 Dermatitis, unspecified: Secondary | ICD-10-CM | POA: Diagnosis not present

## 2017-08-11 DIAGNOSIS — I890 Lymphedema, not elsewhere classified: Secondary | ICD-10-CM | POA: Insufficient documentation

## 2017-08-11 DIAGNOSIS — I130 Hypertensive heart and chronic kidney disease with heart failure and stage 1 through stage 4 chronic kidney disease, or unspecified chronic kidney disease: Secondary | ICD-10-CM | POA: Diagnosis not present

## 2017-08-11 DIAGNOSIS — E118 Type 2 diabetes mellitus with unspecified complications: Secondary | ICD-10-CM

## 2017-08-11 DIAGNOSIS — Z794 Long term (current) use of insulin: Secondary | ICD-10-CM | POA: Diagnosis not present

## 2017-08-11 DIAGNOSIS — Z9889 Other specified postprocedural states: Secondary | ICD-10-CM | POA: Insufficient documentation

## 2017-08-11 DIAGNOSIS — Z888 Allergy status to other drugs, medicaments and biological substances status: Secondary | ICD-10-CM | POA: Diagnosis not present

## 2017-08-11 DIAGNOSIS — I441 Atrioventricular block, second degree: Secondary | ICD-10-CM | POA: Insufficient documentation

## 2017-08-11 DIAGNOSIS — N183 Chronic kidney disease, stage 3 unspecified: Secondary | ICD-10-CM

## 2017-08-11 DIAGNOSIS — L299 Pruritus, unspecified: Secondary | ICD-10-CM | POA: Diagnosis not present

## 2017-08-11 DIAGNOSIS — M109 Gout, unspecified: Secondary | ICD-10-CM | POA: Diagnosis not present

## 2017-08-11 DIAGNOSIS — Z7982 Long term (current) use of aspirin: Secondary | ICD-10-CM | POA: Diagnosis not present

## 2017-08-11 DIAGNOSIS — Z950 Presence of cardiac pacemaker: Secondary | ICD-10-CM | POA: Insufficient documentation

## 2017-08-11 DIAGNOSIS — E11621 Type 2 diabetes mellitus with foot ulcer: Secondary | ICD-10-CM | POA: Diagnosis present

## 2017-08-11 LAB — GLUCOSE, POCT (MANUAL RESULT ENTRY): POC Glucose: 102 mg/dl — AB (ref 70–99)

## 2017-08-11 LAB — POCT GLYCOSYLATED HEMOGLOBIN (HGB A1C): Hemoglobin A1C: 7.5

## 2017-08-11 MED ORDER — FUROSEMIDE 20 MG PO TABS: 20.0000 mg | ORAL_TABLET | Freq: Every day | ORAL | 3 refills | Status: DC

## 2017-08-11 MED ORDER — AMLODIPINE BESYLATE 5 MG PO TABS: 5.0000 mg | ORAL_TABLET | Freq: Every day | ORAL | 3 refills | Status: DC

## 2017-08-11 MED ORDER — HYDROCORTISONE VALERATE 0.2 % EX CREA: TOPICAL_CREAM | CUTANEOUS | 0 refills | Status: DC

## 2017-08-11 NOTE — Patient Instructions (Signed)
Blood pressure is not controlled.  We have added a new blood pressure medication called amlodipine 5 mg daily.  Continue to limit salt in the foods.  I have given a prescription for some hydrocortisone cream to use daily as needed to your back.

## 2017-08-11 NOTE — Progress Notes (Signed)
Pt states he would like his back looked out. Pt states he an operation 4 years ago and it is itching bad

## 2017-08-11 NOTE — Progress Notes (Signed)
[name], male    DOB: [birth year]  MRN: [id number]  CC: Diabetes   Subjective: [name] is a 57 y.o. male who presents for chronic disease management.  Last seen 3 months ago. His concerns today include:  Patient with history of diabetes type 2 with microalbuminuria, diastolic CHF with pacemaker (secconary to AV block with syncopy), CKD stage III, HOCM, HTN, HL, lymphedema. 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  C/o itching over posterior thorax x 1 mth.  Feels hot. Had large furuncle lanced 4 yrs ago which left a large significant scar and he wonders if the infection is recurring because of the itching that he is having  2.  DM: checks BS QD in a.m.  Occasional 60s but usually in low 100s-130.  Eating habits:  Cooking more; eating more salads Exercise: doing more gardening.  Plans to check out [employer] and perhaps joint Meds: Compliant with insulin.  3.  HTN/d-CHF:  No device to check BP No table salt -no CP/SOB.  Chronic lymphedema in the legs  4.  CKD 3:  Saw neph in Dec or January.  No changes made in medicines.  He is making good urine. Continues to get Fabrazyme infusion every 2 weeks  Patient Active Problem List   Diagnosis Date Noted  . Lymphedema 10/06/2016  . Gout, arthritis   . 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 Chronic ulcer of left foot ([place]) 04/21/2015  . Onychomycosis of toenail 09/26/2014  . Encounter for wound care 08/12/2014  . Iron deficiency anemia 07/29/2014  . CKD (chronic kidney disease), stage III ([place])   . Chronic diastolic HF (heart failure) ([place]) 03/27/2013  . Peripheral edema 07/20/2012  . Obstructive sleep apnea, suspected 06/21/2012  . Pacemaker-St.Jude 02/23/2012  . Hypertension 08/09/2011  . COPD with asthma ([place]) 08/09/2011  . Mobitz type II atrioventricular block 05/19/2011  . 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[place]) 05/19/2011     Current Outpatient Medications on File Prior to Visit  Medication  Sig Dispense Refill  . ACCU-CHEK AVIVA PLUS test strip TEST 3 TIMES DAILY. ICD 10 E11.8 100 each 12  . ACCU-CHEK FASTCLIX LANCETS MISC USE TO TEST BLOOD SUGAR 3 TO 4 TIMES DAILY 102 each 5  . acetaminophen (TYLENOL) 500 MG tablet Take 500 mg by mouth every 14 (fourteen) days. Only taking 1 hour before infusion.  AS premed for Fabrazyme    . Agalsidase beta (FABRAZYME IV) Inject 115 mg into the vein every 14 (fourteen) days.     . ALBUTEROL SULFATE HFA IN Inhale 1 puff into the lungs daily as needed (shortness of breath or wheezing). For shortness of breath    . allopurinol (ZYLOPRIM) 300 MG tablet Take 1 tablet (300 mg total) by mouth daily. 30 tablet 5  . aspirin 81 MG tablet Take 81 mg by mouth daily.     [name] Kitchen atorvastatin (LIPITOR) 20 MG tablet Take 1 tablet (20 mg total) by mouth daily. 30 tablet 5  . fexofenadine (ALLEGRA) 180 MG tablet Take 180 mg by mouth every 14 (fourteen) days.     . fluticasone (FLONASE) 50 MCG/ACT nasal spray Place 1 spray into both nostrils daily. 16 g 1  . Insulin Lispro Prot & Lispro (HUMALOG 75/25 MIX) (75-25) 100 UNIT/ML Kwikpen Inject 20 Units into the skin 2 (two) times daily. 15 mL 11  . Insulin Pen Needle (PX SHORTLENGTH PEN NEEDLES) 31G X 8 MM MISC 1 each by Does not apply  route 2 (two) times daily before a meal. 100 each 8  . losartan-hydrochlorothiazide (HYZAAR) 50-12.5 MG tablet Take 1 tablet by mouth daily. 30 tablet 5  . OLIVE LEAF PO Take 1 tablet by mouth 2 (two) times daily.      No current facility-administered medications on file prior to visit.     Allergies  Allergen Reactions  . Shellfish Allergy Hives and Swelling  . Lisinopril     Social History   Socioeconomic History  . Marital status: Divorced    Spouse name: Not on file  . Number of children: Not on file  . Years of education: Not on file  . Highest education level: Not on file  Social Needs  . Financial resource strain: Not on file  . Food insecurity - worry: Not on file  .  Food insecurity - inability: Not on file  . Transportation needs - medical: Not on file  . Transportation needs - non-medical: Not on file  Occupational History  . Not on file  Tobacco Use  . Smoking status: Never Smoker  . Smokeless tobacco: Never Used  Substance and Sexual Activity  . Alcohol use: Yes    Comment: 07/23/2014 "might have a few drinks on holidays or at cookouts"  . Drug use: No  . Sexual activity: Yes  Other Topics Concern  . 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 Stroke Mother   . Colon cancer Neg Hx     Past Surgical History:  Procedure Laterality Date  . CARDIAC CATHETERIZATION  03-12-2011  DR Daneen Schick   HYPERTROPHIC CARDIOMYOPATHY WITH LV CAVITY  APPEARANCE CONSISTANT WITH SIGNIFICANT APICAL HYPERTROPHY/ NORMAL LVSF / EF 55%/ EVIDENCE OF DIASTOLIC DYSFUNCTION WITH EDP OF 23-32mmHg AFTER A-WAVE/ NORMAL CORONARY ARTERIES  . EVALUATION UNDER ANESTHESIA WITH FISTULECTOMY N/A 06/23/2015   Procedure: EXAM UNDER ANESTHESIA WITH POSSIBLE FISTULOTOMY;  Surgeon: Judeth Horn, MD;  Location: [facility];  Service: General;  Laterality: N/A;  . HERNIA REPAIR  [id number]   Umbilical Hernia Repair  . INCISION AND DRAINAGE PERIRECTAL ABSCESS Left 07/29/2014   Procedure: IRRIGATION AND DEBRIDEMENT PERIRECTAL ABSCESS;  Surgeon: Doreen Salvage, MD;  Location: [facility];  Service: General;  Laterality: Left;  Prone position  . INSERT / REPLACE / REMOVE PACEMAKER  02/12/2011   SJM implanted by Dr Rayann Heman for Mobitz II second degree AV block and syncope  . IRRIGATION AND DEBRIDEMENT ABSCESS N/A 07/27/2013   Procedure: IRRIGATION AND DEBRIDEMENT OF SKIN, SOFT TISSUE AND MUSCLES OF UPPER BACK (11X19X4cm) WITH 10 BLADE AND PULSATILE LAVAGE ;  Surgeon: Gayland Curry, MD;  Location: [facility];  Service: General;  Laterality: N/A;  . UMBILICAL HERNIA REPAIR  03/22/2012   Procedure: HERNIA REPAIR UMBILICAL ADULT;  Surgeon: Leighton Ruff, MD;  Location[facility];  Service: General;  Laterality: N/A;  Umbilical Hernia Repair     ROS: Review of Systems Negative except as stated above PHYSICAL EXAM: BP (!) 150/90   Pulse 62   Temp 98.3 F (36.8 C) (Oral)   Resp 16   Ht 5\' 10"  (1.778 m)   Wt 297 lb (134.7 kg)   SpO2 99%   BMI 42.62 kg/m   Wt Readings from Last 3 Encounters:  08/11/17 297 lb (134.7 kg)  07/29/17 296 lb 2 oz (134.3 kg)  07/15/17 295 lb 9.6 oz (134.1 kg)   Repeat blood pressure 150/90 Physical  Exam  General appearance - alert, well appearing, obese African-American male and in no distress Mental status - alert, oriented to person, place, and time, normal mood, behavior, speech, dress, motor activity, and thought processes Neck - supple, no significant adenopathy Chest - clear to auscultation, no wheezes, rales or rhonchi, symmetric air entry Heart - normal rate, regular rhythm, normal S1, S2, no murmurs, rubs, clicks or gallops Extremities -lymphedema in the legs   Area on posterior thorax showing significant scar from where he had previous extensive skin infection.  The skin and the scar is very dry and peeling  BS 102/A1C 7.5    Chemistry      Component Value Date/Time   NA 141 07/15/2017 0747   NA 144 10/06/2016 1038   K 3.8 07/15/2017 0747   CL 107 07/15/2017 0747   CO2 25 07/15/2017 0747   BUN 26 (H) 07/15/2017 0747   BUN 23 10/06/2016 1038   CREATININE 1.65 (H) 07/15/2017 0747   CREATININE 1.28 11/07/2014 0910      Component Value Date/Time   CALCIUM 9.1 07/15/2017 0747   ALKPHOS 65 07/15/2017 0747   AST 29 07/15/2017 0747   ALT 20 07/15/2017 0747   BILITOT 0.5 07/15/2017 0747   BILITOT 0.4 10/06/2016 1038     Lab Results  Component Value Date   WBC 7.4 07/15/2017   HGB 14.9 07/15/2017   HCT 44.0 07/15/2017   MCV 89.4 07/15/2017   PLT 210 07/15/2017   ASSESSMENT AND PLAN: 1. Controlled type 2 diabetes mellitus with complication, with long-term current use of  insulin (HCC) A1c is not at goal but reported blood sugars are.  Continue current dose of Humalog 75/25. Encourage him to get in some form of aerobic exercise at least 3-4 days a week as tolerated - POCT glucose (manual entry) - POCT glycosylated hemoglobin (Hb A1C)  2. Essential hypertension Not at goal.  Add Norvasc.  His blood pressure is checked at his infusions.  I told patient that the goal is 130/80 - furosemide (LASIX) 20 MG tablet; Take 1 tablet (20 mg total) by mouth daily. Lower extremiity edema  Dispense: 30 tablet; Refill: 3 - amLODipine (NORVASC) 5 MG tablet; Take 1 tablet (5 mg total) by mouth daily.  Dispense: 90 tablet; Refill: 3  3. CKD (chronic kidney disease), stage III (HCC) Stable.  Followed by [facility]  4. Dermatitis - hydrocortisone valerate cream (WESTCORT) 0.2 %; Apply to affected area daily PRN  Dispense: 45 g; Refill: 0  Patient was given the opportunity to ask questions.  Patient verbalized understanding of the plan and was able to repeat key elements of the plan.   Orders Placed This Encounter  Procedures  . POCT glucose (manual entry)  . POCT glycosylated hemoglobin (Hb A1C)     Requested Prescriptions   Signed Prescriptions Disp Refills  . furosemide (LASIX) 20 MG tablet 30 tablet 3    Sig: Take 1 tablet (20 mg total) by mouth daily. Lower extremiity edema  . amLODipine (NORVASC) 5 MG tablet 90 tablet 3    Sig: Take 1 tablet (5 mg total) by mouth daily.  . hydrocortisone valerate cream (WESTCORT) 0.2 % 45 g 0    Sig: Apply to affected area daily PRN    Return in about 3 months (around 11/11/2017).  Karle Plumber, MD, FACP

## 2017-08-12 ENCOUNTER — Encounter (HOSPITAL_COMMUNITY): Payer: Self-pay

## 2017-08-12 ENCOUNTER — Encounter (HOSPITAL_COMMUNITY)
Admission: RE | Admit: 2017-08-12 | Discharge: 2017-08-12 | Disposition: A | Discharge status: Home / Self Care | Payer: Medicare Other | Source: Ambulatory Visit | Attending: Nephrology | Admitting: Nephrology

## 2017-08-12 DIAGNOSIS — E1101 Type 2 diabetes mellitus with hyperosmolarity with coma: Secondary | ICD-10-CM | POA: Diagnosis not present

## 2017-08-12 DIAGNOSIS — E7521 Fabry (-Anderson) disease: Secondary | ICD-10-CM | POA: Diagnosis not present

## 2017-08-12 LAB — URINALYSIS, ROUTINE W REFLEX MICROSCOPIC
Bilirubin Urine: NEGATIVE
Glucose, UA: NEGATIVE mg/dL
Hgb urine dipstick: NEGATIVE
Ketones, ur: NEGATIVE mg/dL
Nitrite: POSITIVE — AB
Protein, ur: 100 mg/dL — AB
Specific Gravity, Urine: 1.01 (ref 1.005–1.030)
Squamous Epithelial / LPF: NONE SEEN
pH: 5 (ref 5.0–8.0)

## 2017-08-12 LAB — COMPREHENSIVE METABOLIC PANEL
ALT: 19 U/L (ref 17–63)
AST: 31 U/L (ref 15–41)
Albumin: 3 g/dL — ABNORMAL LOW (ref 3.5–5.0)
Alkaline Phosphatase: 66 U/L (ref 38–126)
Anion gap: 9 (ref 5–15)
BUN: 20 mg/dL (ref 6–20)
CO2: 27 mmol/L (ref 22–32)
Calcium: 9 mg/dL (ref 8.9–10.3)
Chloride: 107 mmol/L (ref 101–111)
Creatinine, Ser: 1.56 mg/dL — ABNORMAL HIGH (ref 0.61–1.24)
GFR calc Af Amer: 56 mL/min — ABNORMAL LOW (ref >60)
GFR calc non Af Amer: 48 mL/min — ABNORMAL LOW (ref >60)
Glucose, Bld: 148 mg/dL — ABNORMAL HIGH (ref 65–99)
Potassium: 4.1 mmol/L (ref 3.5–5.1)
Sodium: 143 mmol/L (ref 135–145)
Total Bilirubin: 0.4 mg/dL (ref 0.3–1.2)
Total Protein: 7.3 g/dL (ref 6.5–8.1)

## 2017-08-12 LAB — PHOSPHORUS: Phosphorus: 4.2 mg/dL (ref 2.5–4.6)

## 2017-08-12 MED ORDER — SODIUM CHLORIDE 0.9 % IV SOLN
INTRAVENOUS | Status: DC
  Administered 2017-08-12: 08:00:00 via INTRAVENOUS

## 2017-08-12 MED ORDER — SODIUM CHLORIDE 0.9 % IV SOLN
115.0000 mg | INTRAVENOUS | Status: DC
  Administered 2017-08-12: 115 mg via INTRAVENOUS
  Filled 2017-08-12: qty 21

## 2017-08-12 NOTE — Discharge Instructions (Signed)
Agalsidase Beta injection What is this medicine? AGALSIDASE BETA is used to replace an enzyme that is missing in patients with Fabry disease. It is not a cure. This medicine may be used for other purposes; ask your health care provider or pharmacist if you have questions. COMMON BRAND NAME(S): Fabrazyme What should I tell my health care provider before I take this medicine? They need to know if you have any of these conditions: -heart disease -an unusual or allergic reaction to agalsidase beta, mannitol, other medicines, foods, dyes, or preservatives -pregnant or trying to get pregnant -breast-feeding How should I use this medicine? This medicine is for infusion into a vein. It is given by a health care professional in a hospital or clinic setting. Talk to your pediatrician regarding the use of this medicine in children. Special care may be needed. Overdosage: If you think you have taken too much of this medicine contact a poison control center or emergency room at once. NOTE: This medicine is only for you. Do not share this medicine with others. What if I miss a dose? It is important not to miss your dose. Call your doctor or health care professional if you are unable to keep an appointment. What may interact with this medicine? -amiodarone -chloroquine -gentamicin -hydroxychloroquine -monobenzone This list may not describe all possible interactions. Give your health care provider a list of all the medicines, herbs, non-prescription drugs, or dietary supplements you use. Also tell them if you smoke, drink alcohol, or use illegal drugs. Some items may interact with your medicine. What should I watch for while using this medicine? Visit your doctor or health care professional for regular checks on your progress. Tell your doctor or healthcare professional if your symptoms do not start to get better or if they get worse. There is a registry for patients with Fabry disease. The registry is  used to gather information about the disease and its effects. Talk to your health care provider if you would like to join the registry. What side effects may I notice from receiving this medicine? Side effects that you should report to your doctor or health care professional as soon as possible: -allergic reactions like skin rash, itching or hives, swelling of the face, lips, or tongue -breathing problems -chest pain, tightness -depression -dizziness -fast, irregular heart beat -swelling of the arms or legs Side effects that usually do not require medical attention (report to your doctor or health care professional if they continue or are bothersome): -aches or pains -anxiety -fever or chills at the time of injection -headache -nausea, vomiting -stomach pain, upset This list may not describe all possible side effects. Call your doctor for medical advice about side effects. You may report side effects to FDA at 1-800-FDA-1088. Where should I keep my medicine? This drug is given in a hospital or clinic and will not be stored at home. NOTE: This sheet is a summary. It may not cover all possible information. If you have questions about this medicine, talk to your doctor, pharmacist, or health care provider.  2018 Elsevier/Gold Standard (2006-01-31 12:25:00)  

## 2017-08-18 ENCOUNTER — Encounter: Payer: Self-pay | Admitting: Neurology

## 2017-08-18 ENCOUNTER — Ambulatory Visit: Payer: Medicare Other | Admitting: Neurology

## 2017-08-18 VITALS — BP 148/87 | HR 62 | Ht 70.0 in | Wt 297.0 lb

## 2017-08-18 DIAGNOSIS — E7521 Fabry (-Anderson) disease: Secondary | ICD-10-CM

## 2017-08-18 NOTE — Progress Notes (Signed)
[name] DOB: 04-Aug-1960  Chief Complaint  Patient presents with  . New Patient (Initial Visit)     HISTORICAL  [name] is a 57 year old male, seen in refer by his nephrologist Dr. Donato Heinz for evaluation of Fabry disease, potential cerebrovascular risk, initial evaluation was on August 18, 2017.  I reviewed and summarized the referring note, he has history of Fabry's disease, hypertension, insulin-dependent diabetes, obstructive sleep apnea, Mobitz 2, status post pacemaker placement in September 2012, chronic lympha edema of bilateral lower extremity, gout.  His mother and one younger brother was diagnosed with Fabry's disease around 2010, at that time, he presented with progressive worsening bilateral lower extremity infra edema, he was referred to [facility] for genetic testing, which confirmed a diagnosis of Fabry disease.  He has been treated with Fabrazyme IV infusion every 2 weeks since then.  His brother did suffered painful bilateral lower extremity peripheral neuropathy, he denied significant pain, since he started Fabrazyme IV infusion, his bilateral lower extremity lymphedema has been fairly stable, his kidney function fluctuate, but overall creatinine was stabilized around 1.6 in 05/20/17,  His mother died of stroke at age 62, brother died of stroke at age 37,   He is taking aspirin 81 mg, not MRI candidate due to pacemaker, denies history of strokelike symptoms, he does exercise couple times each week.  Reported previous sleep study showed mild abnormality, but not using CPAP machine, he is active during the day, tends to nod off to sleep after sitting for a while, deny snoring, choking episode.  REVIEW OF SYSTEMS: Full 14 system review of systems performed and notable only for shortness of breath, wheezing, swelling in legs, ringing the ears, allergy  ALLERGIES: Allergies  Allergen Reactions  . Shellfish Allergy Hives and Swelling  .  Lisinopril     HOME MEDICATIONS: Current Outpatient Medications  Medication Sig Dispense Refill  . ACCU-CHEK AVIVA PLUS test strip TEST 3 TIMES DAILY. ICD 10 E11.8 100 each 12  . ACCU-CHEK FASTCLIX LANCETS MISC USE TO TEST BLOOD SUGAR 3 TO 4 TIMES DAILY 102 each 5  . acetaminophen (TYLENOL) 500 MG tablet Take 500 mg by mouth every 14 (fourteen) days. Only taking 1 hour before infusion.  AS premed for Fabrazyme    . Agalsidase beta (FABRAZYME IV) Inject 115 mg into the vein every 14 (fourteen) days.     . ALBUTEROL SULFATE HFA IN Inhale 1 puff into the lungs daily as needed (shortness of breath or wheezing). For shortness of breath    . allopurinol (ZYLOPRIM) 300 MG tablet Take 1 tablet (300 mg total) by mouth daily. 30 tablet 5  . aspirin 81 MG tablet Take 81 mg by mouth daily.     [name] Kitchen atorvastatin (LIPITOR) 20 MG tablet Take 1 tablet (20 mg total) by mouth daily. 30 tablet 5  . fexofenadine (ALLEGRA) 180 MG tablet Take 180 mg by mouth every 14 (fourteen) days.     . fluticasone (FLONASE) 50 MCG/ACT nasal spray Place 1 spray into both nostrils daily. 16 g 1  . furosemide (LASIX) 20 MG tablet Take 1 tablet (20 mg total) by mouth daily. Lower extremiity edema 30 tablet 3  . hydrocortisone valerate cream (WESTCORT) 0.2 % Apply to affected area daily PRN 45 g 0  . Insulin Lispro Prot & Lispro (HUMALOG 75/25 MIX) (75-25) 100 UNIT/ML Kwikpen Inject 20 Units into the skin 2 (two) times daily. 15 mL 11  . Insulin Pen Needle (PX  [place] PEN NEEDLES) 31G X 8 MM MISC 1 each by Does not apply route 2 (two) times daily before a meal. 100 each 8  . losartan-hydrochlorothiazide (HYZAAR) 50-12.5 MG tablet Take 1 tablet by mouth daily. 30 tablet 5  . OLIVE LEAF PO Take 1 tablet by mouth 2 (two) times daily.      No current facility-administered medications for this visit.     PAST MEDICAL HISTORY: Past Medical History:  Diagnosis Date  . Bell's palsy   . Bifascicular block   . Chronic bronchitis  ([place])    "seasonal; get it q yr"  . Chronic diastolic CHF (congestive heart failure) ([place]) 2010  . Chronic kidney disease (CKD), stage II (mild)    stage II to III/notes 07/23/2014  . Diastolic heart failure secondary to hypertrophic cardiomyopathy ([place]) CARDIOLOGIST-- DR Daneen Schick  . Dyspnea    increased exertion   . Edema 07/2013  . Fabry's disease ([place]) RENAL AND CARDIAC INVOVLEMENT   FOLLOWED DR [place]  . Gout, arthritis 2014   bil feet. right worse  . History of cellulitis of skin with lymphangitis LEFT LEG  . HOCM (hypertrophic obstructive cardiomyopathy) ([place])    a. Echo 11/16: Severe LVH, EF 55-60%, abnormal GLS consistent with HOCM, no SAM, mild LAE  . Hypertension 20 years  . Lymphedema of lower extremity LEFT  >  RIGHT   "using ankle high socks at home; pump doesn't work for me" (07/23/2014)  . Pacemaker    02/12/11  . Pneumonia 08/2011  . Seasonal asthma NO INHALERS   30 years  . Second degree Mobitz II AV block    with syncope, s/p PPM  . Short of breath on exertion   . Type II diabetes mellitus ([place]) 2012   INSULIN DEPENDENT    PAST SURGICAL HISTORY: Past Surgical History:  Procedure Laterality Date  . CARDIAC CATHETERIZATION  03-12-2011  DR Daneen Schick   HYPERTROPHIC CARDIOMYOPATHY WITH LV CAVITY  APPEARANCE CONSISTANT WITH SIGNIFICANT APICAL HYPERTROPHY/ NORMAL LVSF / EF 55%/ EVIDENCE OF DIASTOLIC DYSFUNCTION WITH EDP OF 23-3mmHg AFTER A-WAVE/ NORMAL CORONARY ARTERIES  . EVALUATION UNDER ANESTHESIA WITH FISTULECTOMY N/A 06/23/2015   Procedure: EXAM UNDER ANESTHESIA WITH POSSIBLE FISTULOTOMY;  Surgeon: Judeth Horn, MD;  Location: [facility];  Service: General;  Laterality: N/A;  . HERNIA REPAIR  [id number]   Umbilical Hernia Repair  . INCISION AND DRAINAGE PERIRECTAL ABSCESS Left 07/29/2014   Procedure: IRRIGATION AND DEBRIDEMENT PERIRECTAL ABSCESS;  Surgeon: Doreen Salvage, MD;  Location: [facility];  Service: General;  Laterality: Left;  Prone position  . INSERT / REPLACE /  REMOVE PACEMAKER  02/12/2011   SJM implanted by Dr Rayann Heman for Mobitz II second degree AV block and syncope  . IRRIGATION AND DEBRIDEMENT ABSCESS N/A 07/27/2013   Procedure: IRRIGATION AND DEBRIDEMENT OF SKIN, SOFT TISSUE AND MUSCLES OF UPPER BACK (11X19X4cm) WITH 10 BLADE AND PULSATILE LAVAGE ;  Surgeon: Gayland Curry, MD;  Location: [facility];  Service: General;  Laterality: N/A;  . UMBILICAL HERNIA REPAIR  03/22/2012   Procedure: HERNIA REPAIR UMBILICAL ADULT;  Surgeon: Leighton Ruff, MD;  [facility];  Service: General;  Laterality: N/A;  Umbilical Hernia Repair     FAMILY HISTORY: Family History  Problem Relation Age of Onset  . Stroke Mother   . Colon cancer Neg Hx     SOCIAL HISTORY:  Social History   Socioeconomic History  . Marital status: Divorced    Spouse name: Not on file  .  Number of children: Not on file  . Years of education: Not on file  . Highest education level: Not on file  Social Needs  . Financial resource strain: Not on file  . Food insecurity - worry: Not on file  . Food insecurity - inability: Not on file  . Transportation needs - medical: Not on file  . Transportation needs - non-medical: Not on file  Occupational History  . Not on file  Tobacco Use  . Smoking status: Never Smoker  . Smokeless tobacco: Never Used  Substance and Sexual Activity  . Alcohol use: Yes    Comment: 07/23/2014 "might have a few drinks on holidays or at cookouts"  . Drug use: No  . Sexual activity: Yes  Other Topics Concern  . 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  PHYSICAL EXAMNIATION:  Gen: NAD, conversant, well nourised, obese, well groomed                     Cardiovascular: Regular rate rhythm, no peripheral edema, warm, nontender. Eyes:  Conjunctivae clear without exudates or hemorrhage Neck: Supple, no carotid bruits. Pulmonary: Clear to auscultation bilaterally   NEUROLOGICAL EXAM:  MENTAL STATUS: Speech:    Speech is normal; fluent and spontaneous with normal comprehension.  Cognition:     Orientation to time, place and person     Normal recent and remote memory     Normal Attention span and concentration     Normal Language, naming, repeating,spontaneous speech     Fund of knowledge   CRANIAL NERVES: CN II: Visual fields are full to confrontation. Fundoscopic exam is normal with sharp discs and no vascular changes. Pupils are round equal and briskly reactive to light. CN III, IV, VI: extraocular movement are normal. No ptosis. CN V: Facial sensation is intact to pinprick in all 3 divisions bilaterally. Corneal responses are intact.  CN VII: Face is symmetric with normal eye closure and smile. CN VIII: Hearing is normal to rubbing fingers CN IX, X: Palate elevates symmetrically. Phonation is normal. CN XI: Head turning and shoulder shrug are intact CN XII: Tongue is midline with normal movements and no atrophy.  MOTOR: He has bilateral lower extremity lymphedema, no significant weakness,.  REFLEXES: Reflexes are areflexia at limb plantar responses are flexor.  SENSORY: Intact to light touch, pinprick, positional sensation and vibratory sensation are intact in fingers and toes.  COORDINATION: Rapid alternating movements and fine finger movements are intact. There is no dysmetria on finger-to-nose and heel-knee-shin.    GAIT/STANCE: Posture is normal. Gait is steady with normal steps, base, arm swing, and turning. Heel and toe walking are normal. Tandem gait is mildly unsteady  Romberg is absent.   DIAGNOSTIC DATA (LABS, IMAGING, TESTING) - I reviewed patient records, labs, notes, testing and imaging myself where available.   ASSESSMENT AND PLAN  [name] is a 57 y.o. male   Fabry's  Disease.  Multiple vascular risk factors, HTN, HLD, DM, obesity  Keep ASA 81mg    Increase water intake,   Moderate exercise,   ECHO, US Carotid artery.   Marcial Pacas, M.D. Ph.D.  [facility] [address], [address]  [place], [place] [address] Ph: [phone] Fax: [phone]  [id number], Broadus John, MD

## 2017-08-25 ENCOUNTER — Ambulatory Visit (HOSPITAL_COMMUNITY)
Admission: RE | Admit: 2017-08-25 | Discharge: 2017-08-25 | Disposition: A | Discharge status: Home / Self Care | Payer: Medicare Other | Source: Ambulatory Visit | Attending: Neurology | Admitting: Neurology

## 2017-08-25 ENCOUNTER — Ambulatory Visit (HOSPITAL_BASED_OUTPATIENT_CLINIC_OR_DEPARTMENT_OTHER)
Admission: RE | Admit: 2017-08-25 | Discharge: 2017-08-25 | Disposition: A | Discharge status: Home / Self Care | Payer: Medicare Other | Source: Ambulatory Visit | Attending: Neurology | Admitting: Neurology

## 2017-08-25 DIAGNOSIS — I509 Heart failure, unspecified: Secondary | ICD-10-CM | POA: Insufficient documentation

## 2017-08-25 DIAGNOSIS — E7521 Fabry (-Anderson) disease: Secondary | ICD-10-CM

## 2017-08-25 DIAGNOSIS — I130 Hypertensive heart and chronic kidney disease with heart failure and stage 1 through stage 4 chronic kidney disease, or unspecified chronic kidney disease: Secondary | ICD-10-CM | POA: Diagnosis not present

## 2017-08-25 DIAGNOSIS — E1122 Type 2 diabetes mellitus with diabetic chronic kidney disease: Secondary | ICD-10-CM | POA: Diagnosis not present

## 2017-08-25 DIAGNOSIS — N189 Chronic kidney disease, unspecified: Secondary | ICD-10-CM | POA: Insufficient documentation

## 2017-08-25 NOTE — Progress Notes (Signed)
*  PRELIMINARY RESULTS* Vascular Ultrasound Carotid Duplex (Doppler) has been completed.   Findings suggest 1-39% internal carotid artery stenosis bilaterally. Vertebral arteries are patent with antegrade flow.  08/25/2017 2:27 PM Maudry Mayhew, BS, RVT, RDCS, RDMS

## 2017-08-25 NOTE — Progress Notes (Signed)
  Echocardiogram 2D Echocardiogram has been performed.  [name] [name] 08/25/2017, 1:43 PM

## 2017-08-26 ENCOUNTER — Telehealth: Payer: Self-pay | Admitting: *Deleted

## 2017-08-26 NOTE — Telephone Encounter (Signed)
Left patient a detailed message, with results, on voicemail (ok per DPR).  Provided our number to call back with any questions.  

## 2017-08-26 NOTE — Telephone Encounter (Signed)
-----   Message from Marcial Pacas, MD sent at 08/26/2017 10:24 AM EDT ----- Please call patient: There was no significant abnormality on echocardiogram.

## 2017-08-26 NOTE — Telephone Encounter (Signed)
-----   Message from Marcial Pacas, MD sent at 08/26/2017 10:32 AM EDT ----- Please call patient: Ultrasound of carotid arteries showed less than 39% stenosis at bilateral internal carotid artery, bilateral vertebral artery with antegrade flow.  He should continue his aspirin 81 mg daily

## 2017-08-31 ENCOUNTER — Encounter (HOSPITAL_COMMUNITY): Payer: Self-pay

## 2017-08-31 ENCOUNTER — Encounter (HOSPITAL_COMMUNITY)
Admission: RE | Admit: 2017-08-31 | Discharge: 2017-08-31 | Disposition: A | Discharge status: Home / Self Care | Payer: Medicare Other | Source: Ambulatory Visit | Attending: Nephrology | Admitting: Nephrology

## 2017-08-31 DIAGNOSIS — E7521 Fabry (-Anderson) disease: Secondary | ICD-10-CM | POA: Diagnosis not present

## 2017-08-31 DIAGNOSIS — E1101 Type 2 diabetes mellitus with hyperosmolarity with coma: Secondary | ICD-10-CM | POA: Diagnosis not present

## 2017-08-31 MED ORDER — SODIUM CHLORIDE 0.9 % IV SOLN
INTRAVENOUS | Status: DC
  Administered 2017-08-31: 09:00:00 via INTRAVENOUS

## 2017-08-31 MED ORDER — SODIUM CHLORIDE 0.9 % IV SOLN
115.0000 mg | INTRAVENOUS | Status: DC
  Administered 2017-08-31: 115 mg via INTRAVENOUS
  Filled 2017-08-31: qty 21

## 2017-09-14 ENCOUNTER — Telehealth: Payer: Self-pay | Admitting: Cardiology

## 2017-09-14 ENCOUNTER — Encounter: Payer: Medicare Other | Admitting: *Deleted

## 2017-09-14 NOTE — Telephone Encounter (Signed)
LMOVM reminding pt to send remote transmission.   

## 2017-09-15 ENCOUNTER — Encounter: Payer: Self-pay | Admitting: Cardiology

## 2017-09-20 ENCOUNTER — Encounter (HOSPITAL_COMMUNITY)
Admission: RE | Admit: 2017-09-20 | Discharge: 2017-09-20 | Disposition: A | Discharge status: Home / Self Care | Payer: Medicare Other | Source: Ambulatory Visit | Attending: Nephrology | Admitting: Nephrology

## 2017-09-20 ENCOUNTER — Encounter (HOSPITAL_COMMUNITY): Payer: Self-pay

## 2017-09-20 DIAGNOSIS — E1101 Type 2 diabetes mellitus with hyperosmolarity with coma: Secondary | ICD-10-CM | POA: Insufficient documentation

## 2017-09-20 DIAGNOSIS — E7521 Fabry (-Anderson) disease: Secondary | ICD-10-CM | POA: Insufficient documentation

## 2017-09-20 LAB — URINALYSIS, ROUTINE W REFLEX MICROSCOPIC
Bacteria, UA: NONE SEEN
Bilirubin Urine: NEGATIVE
Glucose, UA: NEGATIVE mg/dL
Hgb urine dipstick: NEGATIVE
Ketones, ur: NEGATIVE mg/dL
Nitrite: POSITIVE — AB
Protein, ur: 30 mg/dL — AB
RBC / HPF: NONE SEEN RBC/hpf (ref 0–5)
Specific Gravity, Urine: 1.008 (ref 1.005–1.030)
Squamous Epithelial / LPF: NONE SEEN
pH: 6 (ref 5.0–8.0)

## 2017-09-20 LAB — COMPREHENSIVE METABOLIC PANEL
ALT: 20 U/L (ref 17–63)
AST: 28 U/L (ref 15–41)
Albumin: 3.1 g/dL — ABNORMAL LOW (ref 3.5–5.0)
Alkaline Phosphatase: 75 U/L (ref 38–126)
Anion gap: 11 (ref 5–15)
BUN: 20 mg/dL (ref 6–20)
CO2: 26 mmol/L (ref 22–32)
Calcium: 9.1 mg/dL (ref 8.9–10.3)
Chloride: 106 mmol/L (ref 101–111)
Creatinine, Ser: 1.77 mg/dL — ABNORMAL HIGH (ref 0.61–1.24)
GFR calc Af Amer: 48 mL/min — ABNORMAL LOW (ref >60)
GFR calc non Af Amer: 41 mL/min — ABNORMAL LOW (ref >60)
Glucose, Bld: 169 mg/dL — ABNORMAL HIGH (ref 65–99)
Potassium: 3.8 mmol/L (ref 3.5–5.1)
Sodium: 143 mmol/L (ref 135–145)
Total Bilirubin: 0.7 mg/dL (ref 0.3–1.2)
Total Protein: 8.2 g/dL — ABNORMAL HIGH (ref 6.5–8.1)

## 2017-09-20 LAB — PHOSPHORUS: Phosphorus: 3.8 mg/dL (ref 2.5–4.6)

## 2017-09-20 MED ORDER — SODIUM CHLORIDE 0.9 % IV SOLN
INTRAVENOUS | Status: DC
  Administered 2017-09-20: 08:00:00 via INTRAVENOUS

## 2017-09-20 MED ORDER — SODIUM CHLORIDE 0.9 % IV SOLN
115.0000 mg | INTRAVENOUS | Status: DC
  Administered 2017-09-20: 115 mg via INTRAVENOUS
  Filled 2017-09-20: qty 21
  Filled 2017-09-20: qty 23

## 2017-09-20 NOTE — Discharge Instructions (Signed)
Agalsidase Beta injection What is this medicine? AGALSIDASE BETA is used to replace an enzyme that is missing in patients with Fabry disease. It is not a cure. This medicine may be used for other purposes; ask your health care provider or pharmacist if you have questions. COMMON BRAND NAME(S): Fabrazyme What should I tell my health care provider before I take this medicine? They need to know if you have any of these conditions: -heart disease -an unusual or allergic reaction to agalsidase beta, mannitol, other medicines, foods, dyes, or preservatives -pregnant or trying to get pregnant -breast-feeding How should I use this medicine? This medicine is for infusion into a vein. It is given by a health care professional in a hospital or clinic setting. Talk to your pediatrician regarding the use of this medicine in children. Special care may be needed. Overdosage: If you think you have taken too much of this medicine contact a poison control center or emergency room at once. NOTE: This medicine is only for you. Do not share this medicine with others. What if I miss a dose? It is important not to miss your dose. Call your doctor or health care professional if you are unable to keep an appointment. What may interact with this medicine? -amiodarone -chloroquine -gentamicin -hydroxychloroquine -monobenzone This list may not describe all possible interactions. Give your health care provider a list of all the medicines, herbs, non-prescription drugs, or dietary supplements you use. Also tell them if you smoke, drink alcohol, or use illegal drugs. Some items may interact with your medicine. What should I watch for while using this medicine? Visit your doctor or health care professional for regular checks on your progress. Tell your doctor or healthcare professional if your symptoms do not start to get better or if they get worse. There is a registry for patients with Fabry disease. The registry is  used to gather information about the disease and its effects. Talk to your health care provider if you would like to join the registry. What side effects may I notice from receiving this medicine? Side effects that you should report to your doctor or health care professional as soon as possible: -allergic reactions like skin rash, itching or hives, swelling of the face, lips, or tongue -breathing problems -chest pain, tightness -depression -dizziness -fast, irregular heart beat -swelling of the arms or legs Side effects that usually do not require medical attention (report to your doctor or health care professional if they continue or are bothersome): -aches or pains -anxiety -fever or chills at the time of injection -headache -nausea, vomiting -stomach pain, upset This list may not describe all possible side effects. Call your doctor for medical advice about side effects. You may report side effects to FDA at 1-800-FDA-1088. Where should I keep my medicine? This drug is given in a hospital or clinic and will not be stored at home. NOTE: This sheet is a summary. It may not cover all possible information. If you have questions about this medicine, talk to your doctor, pharmacist, or health care provider.  2018 Elsevier/Gold Standard (2006-01-31 12:25:00)  

## 2017-09-23 ENCOUNTER — Ambulatory Visit (HOSPITAL_COMMUNITY)
Admission: EM | Admit: 2017-09-23 | Discharge: 2017-09-23 | Disposition: A | Discharge status: Home / Self Care | Payer: Medicare Other | Attending: Family Medicine | Admitting: Family Medicine

## 2017-09-23 ENCOUNTER — Encounter (HOSPITAL_COMMUNITY): Payer: Self-pay

## 2017-09-23 ENCOUNTER — Other Ambulatory Visit: Payer: Self-pay

## 2017-09-23 DIAGNOSIS — M7989 Other specified soft tissue disorders: Secondary | ICD-10-CM | POA: Diagnosis not present

## 2017-09-23 DIAGNOSIS — M10041 Idiopathic gout, right hand: Secondary | ICD-10-CM

## 2017-09-23 DIAGNOSIS — M25441 Effusion, right hand: Secondary | ICD-10-CM

## 2017-09-23 DIAGNOSIS — M109 Gout, unspecified: Secondary | ICD-10-CM

## 2017-09-23 MED ORDER — METHYLPREDNISOLONE SODIUM SUCC 125 MG IJ SOLR
125.0000 mg | Freq: Once | INTRAMUSCULAR | Status: AC
  Administered 2017-09-23: 125 mg via INTRAMUSCULAR

## 2017-09-23 MED ORDER — METHYLPREDNISOLONE SODIUM SUCC 125 MG IJ SOLR
INTRAMUSCULAR | Status: AC
  Filled 2017-09-23: qty 2

## 2017-09-23 NOTE — Discharge Instructions (Addendum)
Steroid shot given in office Continue allopurinol as prescribed Follow up with PCP if symptoms persist Return here or go to ER if you have any new or worsening symptoms

## 2017-09-23 NOTE — ED Provider Notes (Signed)
[place]    CSN: [id number] Arrival date & time: 09/23/17  1110     History   Chief Complaint Chief Complaint  Patient presents with  . hand swelling    HPI [name] is a 57 y.o. male right fifth finger swelling.  It began two weeks ago.  He denies a precipitating event, but attributes it to a bug bite.  He has tried ice and benadryl every 4-6 hours for the past 72 hours without relief.  His symptoms are made worse with range of motion.  He denies a similar symptoms in the past.    HPI  Past Medical History:  Diagnosis Date  . Bell's palsy   . Bifascicular block   . Chronic bronchitis ([place])    "seasonal; get it q yr"  . Chronic diastolic CHF (congestive heart failure) ([place]) 2010  . Chronic kidney disease (CKD), stage II (mild)    stage II to III/notes 07/23/2014  . Diastolic heart failure secondary to hypertrophic cardiomyopathy ([place]) CARDIOLOGIST-- DR Daneen Schick  . Dyspnea    increased exertion   . Edema 07/2013  . 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 Gout, arthritis 2014   bil feet. right worse  . History of cellulitis of skin with lymphangitis LEFT LEG  . HOCM (hypertrophic obstructive cardiomyopathy) ([place])    a. Echo 11/16: Severe LVH, EF 55-60%, abnormal GLS consistent with HOCM, no SAM, mild LAE  . Hypertension 20 years  . Lymphedema of lower extremity LEFT  >  RIGHT   "using ankle high socks at home; pump doesn't work for me" (07/23/2014)  . Pacemaker    02/12/11  . Pneumonia 08/2011  . Seasonal asthma NO INHALERS   30 years  . Second degree Mobitz II AV block    with syncope, s/p PPM  . Short of breath on exertion   . Type II diabetes mellitus ([place]) 2012   INSULIN DEPENDENT    Patient Active Problem List   Diagnosis Date Noted  . Lymphedema 10/06/2016  . Gout, arthritis   . 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 Chronic ulcer of left foot ([place]) 04/21/2015  . Onychomycosis of toenail 09/26/2014    . Encounter for wound care 08/12/2014  . Iron deficiency anemia 07/29/2014  . CKD (chronic kidney disease), stage III ([place])   . Chronic diastolic HF (heart failure) ([place]) 03/27/2013  . Peripheral edema 07/20/2012  . Obstructive sleep apnea, suspected 06/21/2012  . Pacemaker-St.[employer]ude 02/23/2012  . Hypertension 08/09/2011  . COPD with asthma ([place]) 08/09/2011  . Mobitz type II atrioventricular block 05/19/2011  . 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[place]) 05/19/2011    Past Surgical History:  Procedure Laterality Date  . CARDIAC CATHETERIZATION  03-12-2011  DR Daneen Schick   HYPERTROPHIC CARDIOMYOPATHY WITH LV CAVITY  APPEARANCE CONSISTANT WITH SIGNIFICANT APICAL HYPERTROPHY/ NORMAL LVSF / EF 55%/ EVIDENCE OF DIASTOLIC DYSFUNCTION WITH EDP OF 23-57mmHg AFTER A-WAVE/ NORMAL CORONARY ARTERIES  . EVALUATION UNDER ANESTHESIA WITH FISTULECTOMY N/A 06/23/2015   Procedure: EXAM UNDER ANESTHESIA WITH POSSIBLE FISTULOTOMY;  Surgeon: Judeth Horn, MD;  Location: [facility];  Service: General;  Laterality: N/A;  . HERNIA REPAIR  [id number]   Umbilical Hernia Repair  . INCISION AND DRAINAGE PERIRECTAL ABSCESS Left 07/29/2014   Procedure: IRRIGATION AND DEBRIDEMENT PERIRECTAL ABSCESS;  Surgeon: Doreen Salvage, MD;  Location: [facility];  Service: General;  Laterality: Left;  Prone position  . INSERT / REPLACE / REMOVE PACEMAKER  02/12/2011  SJM implanted by Dr Rayann Heman for Mobitz II second degree AV block and syncope  . IRRIGATION AND DEBRIDEMENT ABSCESS N/A 07/27/2013   Procedure: IRRIGATION AND DEBRIDEMENT OF SKIN, SOFT TISSUE AND MUSCLES OF UPPER BACK (11X19X4cm) WITH 10 BLADE AND PULSATILE LAVAGE ;  Surgeon: Gayland Curry, MD;  Location: [facility];  Service: General;  Laterality: N/A;  . UMBILICAL HERNIA REPAIR  03/22/2012   Procedure: HERNIA REPAIR UMBILICAL ADULT;  Surgeon: Leighton Ruff, MD;  Location: [facility];  Service: General;  Laterality: N/A;  Umbilical Hernia Repair        Home Medications    Prior to Admission medications   Medication  Sig Start Date End Date Taking? Authorizing Provider  ACCU-CHEK AVIVA PLUS test strip TEST 3 TIMES DAILY. ICD 10 E11.8 07/27/16  Yes Funches, Josalyn, MD  ACCU-CHEK FASTCLIX LANCETS MISC USE TO TEST BLOOD SUGAR 3 TO 4 TIMES DAILY 11/18/16  Yes Funches, Josalyn, MD  acetaminophen (TYLENOL) 500 MG tablet Take 500 mg by mouth every 14 (fourteen) days. Only taking 1 hour before infusion.  AS premed for Fabrazyme   Yes [provider]  Agalsidase beta (FABRAZYME IV) Inject 115 mg into the vein every 14 (fourteen) days.    Yes [provider]  ALBUTEROL SULFATE HFA IN Inhale 1 puff into the lungs daily as needed (shortness of breath or wheezing). For shortness of breath   Yes [provider]  allopurinol (ZYLOPRIM) 300 MG tablet Take 1 tablet (300 mg total) by mouth daily. 10/06/16  Yes Newlin, Charlane Ferretti, MD  amLODipine (NORVASC) 5 MG tablet Take 5 mg by mouth daily.   Yes [provider]  aspirin 81 MG tablet Take 81 mg by mouth daily.    Yes [provider]  atorvastatin (LIPITOR) 20 MG tablet Take 1 tablet (20 mg total) by mouth daily. 05/12/17  Yes Ladell Pier, MD  fexofenadine (ALLEGRA) 180 MG tablet Take 180 mg by mouth every 14 (fourteen) days.    Yes [provider]  fluticasone (FLONASE) 50 MCG/ACT nasal spray Place 1 spray into both nostrils daily. 05/12/17  Yes Ladell Pier, MD  furosemide (LASIX) 20 MG tablet Take 1 tablet (20 mg total) by mouth daily. Lower extremiity edema 08/11/17  Yes Ladell Pier, MD  hydrocortisone valerate cream (WESTCORT) 0.2 % Apply to affected area daily PRN 08/11/17  Yes Ladell Pier, MD  Insulin Lispro Prot & Lispro (HUMALOG 75/25 MIX) (75-25) 100 UNIT/ML Kwikpen Inject 20 Units into the skin 2 (two) times daily. 10/06/16  Yes Charlott Rakes, MD  Insulin Pen Needle (PX [place] PEN NEEDLES) 31G X 8 MM MISC 1 each by Does not apply route 2 (two) times daily before a meal. 10/06/15  Yes Funches,  Josalyn, MD  losartan-hydrochlorothiazide (HYZAAR) 50-12.5 MG tablet Take 1 tablet by mouth daily. 05/12/17  Yes Ladell Pier, MD  OLIVE LEAF PO Take 1 tablet by mouth 2 (two) times daily.    Yes [provider]    Family History Family History  Problem Relation Age of Onset  . Stroke Mother   . Colon cancer Neg Hx     Social History Social History   Tobacco Use  . Smoking status: Never Smoker  . Smokeless tobacco: Never Used  Substance Use Topics  . Alcohol use: Yes    Comment: 07/23/2014 "might have a few drinks on holidays or at cookouts"  . Drug use: No     Allergies   Shellfish allergy  and Lisinopril   Review of Systems Review of Systems  Constitutional: Negative for chills, fatigue and fever.  Respiratory: Negative for shortness of breath.   Cardiovascular: Negative for chest pain.  Gastrointestinal: Negative for nausea and vomiting.  Musculoskeletal: Positive for joint swelling. Negative for arthralgias.     Physical Exam Triage Vital Signs ED Triage Vitals [09/23/17 1256]  Enc Vitals Group     BP (!) 141/76     Pulse Rate (!) 55     Resp 18     Temp 98.2 F (36.8 C)     Temp src      SpO2 100 %     Weight 300 lb (136.1 kg)     Height      Head Circumference      Peak Flow      Pain Score 5     Pain Loc      Pain Edu?      Excl. in [place]?    No data found.  Updated Vital Signs BP (!) 141/76   Pulse (!) 55   Temp 98.2 F (36.8 C)   Resp 18   Wt 300 lb (136.1 kg)   SpO2 100%   BMI 43.05 kg/m   Visual Acuity Right Eye Distance:   Left Eye Distance:   Bilateral Distance:    Right Eye Near:   Left Eye Near:    Bilateral Near:     Physical Exam  Constitutional: He is oriented to person, place, and time. He appears well-developed and well-nourished. No distress.  HENT:  Head: Normocephalic and atraumatic.  Right Ear: External ear normal.  Left Ear: External ear normal.  Nose: Nose normal.  Eyes: EOM are normal. No  scleral icterus.  Neck: Normal range of motion.  Cardiovascular: Normal rate, regular rhythm and normal heart sounds. Exam reveals no friction rub.  No murmur heard. Pulmonary/Chest: Effort normal and breath sounds normal. No stridor. No respiratory distress. He has no wheezes. He has no rales.  Musculoskeletal: He exhibits edema (Right fifth phalange). He exhibits no tenderness.  Radial pulse 2+ bilaterally Right hand: Inspection: Skin overlying fifth phalange tense with erythema and swelling about the fifth phalange. Skin warm and dry to the touch (see pictures below) Palpation: Nontender to palpation ROM: LROM about the fifth phalange Sensation intact Radial pulse 2+  Neurological: He is alert and oriented to person, place, and time.  Skin: Skin is warm and dry. Capillary refill takes 2 to 3 seconds. He is not diaphoretic.  Psychiatric: He has a normal mood and affect. His behavior is normal. Judgment and thought content normal.           UC Treatments / Results  Labs (all labs ordered are listed, but only abnormal results are displayed) Labs Reviewed - No data to display  EKG None Radiology No results found.  Procedures Procedures (including critical care time)  Medications Ordered in UC Medications  methylPREDNISolone sodium succinate (SOLU-MEDROL) 125 mg/2 mL injection 125 mg (125 mg Intramuscular Given 09/23/17 1406)     Initial Impression / Assessment and Plan / UC Course  I have reviewed the triage vital signs and the nursing notes.  Pertinent labs & imaging results that were available during my care of the patient were reviewed by me and considered in my medical decision making (see chart for details).     Patient reports fifth finger swelling for past 2 weeks.  Denies a specific injury.  Hx of symptoms and PE  consistent with gout flare.  Pt has a hx of gout. Offered course of naprosyn or steroid shot.  Request steroid shot.  Given steroid 125 mg  salu-medrol in office.   Will follow up with PCP if symptoms persist.  He will return here or go to ER if he has any new or worsening symptoms.    Final Clinical Impressions(s) / UC Diagnoses   Final diagnoses:  Acute gout of right hand, unspecified cause  Finger joint swelling, right    ED Discharge Orders    None       Controlled Substance Prescriptions  Controlled Substance Registry consulted? No   Lestine Box, [place] 09/23/17 1408

## 2017-09-23 NOTE — ED Triage Notes (Signed)
Pt presents with complaints of right pinky swelling, using benadryl and ice with no relief

## 2017-10-05 ENCOUNTER — Encounter (HOSPITAL_COMMUNITY): Payer: Self-pay

## 2017-10-05 ENCOUNTER — Encounter (HOSPITAL_COMMUNITY)
Admission: RE | Admit: 2017-10-05 | Discharge: 2017-10-05 | Disposition: A | Discharge status: Home / Self Care | Payer: Medicare Other | Source: Ambulatory Visit | Attending: Nephrology | Admitting: Nephrology

## 2017-10-05 DIAGNOSIS — E1101 Type 2 diabetes mellitus with hyperosmolarity with coma: Secondary | ICD-10-CM | POA: Diagnosis not present

## 2017-10-05 DIAGNOSIS — E7521 Fabry (-Anderson) disease: Secondary | ICD-10-CM | POA: Insufficient documentation

## 2017-10-05 MED ORDER — SODIUM CHLORIDE 0.9 % IV SOLN
115.0000 mg | INTRAVENOUS | Status: DC
  Administered 2017-10-05: 115 mg via INTRAVENOUS
  Filled 2017-10-05: qty 21

## 2017-10-05 MED ORDER — SODIUM CHLORIDE 0.9 % IV SOLN
INTRAVENOUS | Status: DC
  Administered 2017-10-05 (×2): via INTRAVENOUS

## 2017-10-10 ENCOUNTER — Other Ambulatory Visit: Payer: Self-pay | Admitting: Family Medicine

## 2017-10-10 DIAGNOSIS — E1165 Type 2 diabetes mellitus with hyperglycemia: Secondary | ICD-10-CM

## 2017-10-10 DIAGNOSIS — Z794 Long term (current) use of insulin: Principal | ICD-10-CM

## 2017-10-10 DIAGNOSIS — IMO0002 Reserved for concepts with insufficient information to code with codable children: Secondary | ICD-10-CM

## 2017-10-10 DIAGNOSIS — E118 Type 2 diabetes mellitus with unspecified complications: Principal | ICD-10-CM

## 2017-10-19 ENCOUNTER — Encounter (HOSPITAL_COMMUNITY)
Admission: RE | Admit: 2017-10-19 | Discharge: 2017-10-19 | Disposition: A | Discharge status: Home / Self Care | Payer: Medicare Other | Source: Ambulatory Visit | Attending: Nephrology | Admitting: Nephrology

## 2017-10-19 ENCOUNTER — Encounter (HOSPITAL_COMMUNITY): Payer: Self-pay

## 2017-10-19 DIAGNOSIS — E7521 Fabry (-Anderson) disease: Secondary | ICD-10-CM | POA: Diagnosis not present

## 2017-10-19 DIAGNOSIS — E1101 Type 2 diabetes mellitus with hyperosmolarity with coma: Secondary | ICD-10-CM | POA: Diagnosis not present

## 2017-10-19 LAB — URINALYSIS, ROUTINE W REFLEX MICROSCOPIC
Bacteria, UA: NONE SEEN
Bilirubin Urine: NEGATIVE
Glucose, UA: NEGATIVE mg/dL
Ketones, ur: NEGATIVE mg/dL
Nitrite: POSITIVE — AB
Protein, ur: 100 mg/dL — AB
Specific Gravity, Urine: 1.01 (ref 1.005–1.030)
WBC, UA: >50 WBC/hpf — ABNORMAL HIGH (ref 0–5)
pH: 5 (ref 5.0–8.0)

## 2017-10-19 LAB — IRON AND TIBC
Iron: 58 ug/dL (ref 45–182)
Saturation Ratios: 20 % (ref 17.9–39.5)
TIBC: 297 ug/dL (ref 250–450)
UIBC: 239 ug/dL

## 2017-10-19 LAB — CBC WITH DIFFERENTIAL/PLATELET
Basophils Absolute: 0 10*3/uL (ref 0.0–0.1)
Basophils Relative: 1 %
Eosinophils Absolute: 0.3 10*3/uL (ref 0.0–0.7)
Eosinophils Relative: 6 %
HCT: 46.1 % (ref 39.0–52.0)
Hemoglobin: 15.2 g/dL (ref 13.0–17.0)
Lymphocytes Relative: 38 %
Lymphs Abs: 2.3 10*3/uL (ref 0.7–4.0)
MCH: 30 pg (ref 26.0–34.0)
MCHC: 33 g/dL (ref 30.0–36.0)
MCV: 90.9 fL (ref 78.0–100.0)
Monocytes Absolute: 0.6 10*3/uL (ref 0.1–1.0)
Monocytes Relative: 10 %
Neutro Abs: 2.7 10*3/uL (ref 1.7–7.7)
Neutrophils Relative %: 45 %
Platelets: 199 10*3/uL (ref 150–400)
RBC: 5.07 MIL/uL (ref 4.22–5.81)
RDW: 13.4 % (ref 11.5–15.5)
WBC: 6 10*3/uL (ref 4.0–10.5)

## 2017-10-19 LAB — COMPREHENSIVE METABOLIC PANEL
ALT: 20 U/L (ref 17–63)
AST: 28 U/L (ref 15–41)
Albumin: 3.2 g/dL — ABNORMAL LOW (ref 3.5–5.0)
Alkaline Phosphatase: 70 U/L (ref 38–126)
Anion gap: 10 (ref 5–15)
BUN: 22 mg/dL — ABNORMAL HIGH (ref 6–20)
CO2: 24 mmol/L (ref 22–32)
Calcium: 9.1 mg/dL (ref 8.9–10.3)
Chloride: 109 mmol/L (ref 101–111)
Creatinine, Ser: 1.5 mg/dL — ABNORMAL HIGH (ref 0.61–1.24)
GFR calc Af Amer: 58 mL/min — ABNORMAL LOW (ref >60)
GFR calc non Af Amer: 50 mL/min — ABNORMAL LOW (ref >60)
Glucose, Bld: 137 mg/dL — ABNORMAL HIGH (ref 65–99)
Potassium: 4 mmol/L (ref 3.5–5.1)
Sodium: 143 mmol/L (ref 135–145)
Total Bilirubin: 0.5 mg/dL (ref 0.3–1.2)
Total Protein: 7.3 g/dL (ref 6.5–8.1)

## 2017-10-19 LAB — LIPID PANEL
Cholesterol: 233 mg/dL — ABNORMAL HIGH (ref 0–200)
HDL: 52 mg/dL (ref >40)
LDL Cholesterol: 162 mg/dL — ABNORMAL HIGH (ref 0–99)
Total CHOL/HDL Ratio: 4.5 RATIO
Triglycerides: 94 mg/dL (ref <150)
VLDL: 19 mg/dL (ref 0–40)

## 2017-10-19 LAB — FERRITIN: Ferritin: 137 ng/mL (ref 24–336)

## 2017-10-19 LAB — PHOSPHORUS: Phosphorus: 3.5 mg/dL (ref 2.5–4.6)

## 2017-10-19 MED ORDER — SODIUM CHLORIDE 0.9 % IV SOLN
115.0000 mg | INTRAVENOUS | Status: DC
  Administered 2017-10-19: 115 mg via INTRAVENOUS
  Filled 2017-10-19: qty 21

## 2017-10-19 MED ORDER — SODIUM CHLORIDE 0.9 % IV SOLN
INTRAVENOUS | Status: DC
  Administered 2017-10-19: 09:00:00 via INTRAVENOUS

## 2017-10-19 NOTE — Discharge Instructions (Signed)
Fabry Disease °Fabry disease is a rare genetic disorder. A gene defect (mutation) alters a protein, called an enzyme, that normally breaks down a particular fat (globotriaosylceramide). The mutation keeps the enzyme from breaking down the fat. This causes excess fat to build up in the blood vessels (vasculature) of many organs of the body. Over time, this results in less blood flow and fewer nutrients getting to the body's organs. The buildup can also wear away at nerve fibers. That causes pain and other symptoms. The areas most often affected are the hands, feet, skin, eyes, and digestive system. Eventually the fatty deposits can affect your cardiovascular system, nervous system, and kidneys. This can cause a heart attack, stroke, kidney damage, or other life-threatening complications. °What are the causes? °Fabry disease is caused by a genetic mutation. This mutation is passed down through families (inherited). °What increases the risk? °You may be more likely to have Fabry disease if: °· You are male. °· You have a family history of the disease. ° °What are the signs or symptoms? °Symptoms of Fabry disease include: °· A rash of small, raised, reddish-purple dots on the skin (angiokeratomas). °· Cloudy eyes or other eye problems. °· Chronic pain, especially in the hands or feet. °· Burning sensations in the hands and feet. °· Gastrointestinal problems, such as bloating, diarrhea, and frequent bowel movements. °· Ringing in the ears (tinnitus). °· Hearing loss. °· Decreased sweat production (hypohidrosis) or no sweat production (anhidrosis). ° °Over time, untreated Fabry disease can cause: °· Severe back pain in the area of the kidneys. °· Heart attack. °· Stroke. ° °How is this diagnosed? °Your health care provider may suspect Fabry disease based on your symptoms and family history. Your health care provider will also perform a physical exam. This may include tests, such as: °· A blood test to measure enzyme  activity in your blood (enzyme assay). °· A blood test to look for mutations in the gene that causes Fabry disease. °· Urine tests to check how well your kidneys are working and to check for protein in your urine. °· An ultrasound of your heart (echocardiogram) to create an image of your heart and check for heart disease. ° °If you or your partner has Fabry disease and you are having a child, your child can be screened for the condition at birth. If the results of that test are out of the normal range, the child may need follow-up blood and urine tests. These would help confirm the diagnosis and help determine treatment. Prenatal testing for Fabry disease also can be done. °How is this treated? °Medicines are used to manage Fabry disease. This may include: °· Enzyme replacement therapy. This medicine performs the functions of the enzyme that is missing in Fabry disease. °· Medicines to treat the pain and discomfort in your hands and feet. ° °You may also need treatment for complications caused by Fabry Disease. These complications may be related to your kidneys or heart. Treatments may include: °· Angioplasty to repair or unblock an artery. °· Heart surgery. °· Dialysis to filter your kidneys. °· Kidney transplant. ° °Follow these instructions at home: °· Avoid stressful situations when possible. Stress can worsen the pain related to Fabry disease. °· Stay indoors when the temperatures are very hot or very cold. °· Exercise carefully, as directed by your health care provider. Physical exertion can make Fabry disease worse. °· Practice good hygiene to avoid illnesses that make Fabry disease worse. °· Have a good support system   in place. °Contact a health care provider if: °· You have worsening pain. °· You have worsening vision. °· You have a noticeable decrease in urination. °· You have swelling of your hands, feet, or lower legs that is getting worse. °· You feel increasingly weak or tired. °Get help right away  if: °· You have trouble breathing, chest pain, or a feeling of pressure in your chest. °· You have stroke symptoms. These include: °? Numbness or weakness on one side of your body. °? Droopiness on one side of your face. °? Slurred speech. °? Confusion. °· You have sharp pain in the area of your kidneys. °This information is not intended to replace advice given to you by your health care provider. Make sure you discuss any questions you have with your health care provider. °Document Released: 05/14/2002 Document Revised: 10/30/2015 Document Reviewed: 01/16/2014 °Elsevier Interactive Patient Education © 2018 Elsevier Inc. ° °

## 2017-11-03 DIAGNOSIS — I890 Lymphedema, not elsewhere classified: Secondary | ICD-10-CM | POA: Diagnosis not present

## 2017-11-03 DIAGNOSIS — N182 Chronic kidney disease, stage 2 (mild): Secondary | ICD-10-CM | POA: Diagnosis not present

## 2017-11-03 DIAGNOSIS — E7521 Fabry (-Anderson) disease: Secondary | ICD-10-CM | POA: Diagnosis not present

## 2017-11-03 DIAGNOSIS — E118 Type 2 diabetes mellitus with unspecified complications: Secondary | ICD-10-CM | POA: Diagnosis not present

## 2017-11-03 DIAGNOSIS — I129 Hypertensive chronic kidney disease with stage 1 through stage 4 chronic kidney disease, or unspecified chronic kidney disease: Secondary | ICD-10-CM | POA: Diagnosis not present

## 2017-11-04 ENCOUNTER — Encounter (HOSPITAL_COMMUNITY)
Admission: RE | Admit: 2017-11-04 | Discharge: 2017-11-04 | Disposition: A | Discharge status: Home / Self Care | Payer: Medicare Other | Source: Ambulatory Visit | Attending: Nephrology | Admitting: Nephrology

## 2017-11-04 ENCOUNTER — Encounter (HOSPITAL_COMMUNITY): Payer: Self-pay

## 2017-11-04 DIAGNOSIS — E7521 Fabry (-Anderson) disease: Secondary | ICD-10-CM | POA: Diagnosis not present

## 2017-11-04 DIAGNOSIS — E1101 Type 2 diabetes mellitus with hyperosmolarity with coma: Secondary | ICD-10-CM | POA: Diagnosis not present

## 2017-11-04 MED ORDER — SODIUM CHLORIDE 0.9 % IV SOLN
115.0000 mg | INTRAVENOUS | Status: DC
  Administered 2017-11-04: 115 mg via INTRAVENOUS
  Filled 2017-11-04: qty 2

## 2017-11-04 MED ORDER — SODIUM CHLORIDE 0.9 % IV SOLN
Freq: Once | INTRAVENOUS | Status: AC
  Administered 2017-11-04: 08:00:00 via INTRAVENOUS

## 2017-11-04 NOTE — Discharge Instructions (Signed)
Agalsidase Beta injection What is this medicine? AGALSIDASE BETA is used to replace an enzyme that is missing in patients with Fabry disease. It is not a cure. This medicine may be used for other purposes; ask your health care provider or pharmacist if you have questions. COMMON BRAND NAME(S): Fabrazyme What should I tell my health care provider before I take this medicine? They need to know if you have any of these conditions: -heart disease -an unusual or allergic reaction to agalsidase beta, mannitol, other medicines, foods, dyes, or preservatives -pregnant or trying to get pregnant -breast-feeding How should I use this medicine? This medicine is for infusion into a vein. It is given by a health care professional in a hospital or clinic setting. Talk to your pediatrician regarding the use of this medicine in children. Special care may be needed. Overdosage: If you think you have taken too much of this medicine contact a poison control center or emergency room at once. NOTE: This medicine is only for you. Do not share this medicine with others. What if I miss a dose? It is important not to miss your dose. Call your doctor or health care professional if you are unable to keep an appointment. What may interact with this medicine? -amiodarone -chloroquine -gentamicin -hydroxychloroquine -monobenzone This list may not describe all possible interactions. Give your health care provider a list of all the medicines, herbs, non-prescription drugs, or dietary supplements you use. Also tell them if you smoke, drink alcohol, or use illegal drugs. Some items may interact with your medicine. What should I watch for while using this medicine? Visit your doctor or health care professional for regular checks on your progress. Tell your doctor or healthcare professional if your symptoms do not start to get better or if they get worse. There is a registry for patients with Fabry disease. The registry is  used to gather information about the disease and its effects. Talk to your health care provider if you would like to join the registry. What side effects may I notice from receiving this medicine? Side effects that you should report to your doctor or health care professional as soon as possible: -allergic reactions like skin rash, itching or hives, swelling of the face, lips, or tongue -breathing problems -chest pain, tightness -depression -dizziness -fast, irregular heart beat -swelling of the arms or legs Side effects that usually do not require medical attention (report to your doctor or health care professional if they continue or are bothersome): -aches or pains -anxiety -fever or chills at the time of injection -headache -nausea, vomiting -stomach pain, upset This list may not describe all possible side effects. Call your doctor for medical advice about side effects. You may report side effects to FDA at 1-800-FDA-1088. Where should I keep my medicine? This drug is given in a hospital or clinic and will not be stored at home. NOTE: This sheet is a summary. It may not cover all possible information. If you have questions about this medicine, talk to your doctor, pharmacist, or health care provider.  2018 Elsevier/Gold Standard (2006-01-31 12:25:00)  

## 2017-11-14 ENCOUNTER — Other Ambulatory Visit: Payer: Self-pay

## 2017-11-14 DIAGNOSIS — R609 Edema, unspecified: Secondary | ICD-10-CM

## 2017-11-15 ENCOUNTER — Encounter: Payer: Self-pay | Admitting: Cardiology

## 2017-11-17 ENCOUNTER — Other Ambulatory Visit: Payer: Self-pay

## 2017-11-17 NOTE — Patient Outreach (Signed)
[facility]) Care Management  11/17/2017  VASILIS LUHMAN 12-07-1960 [id number]   Medication Adherence call left a message for patient to call back patient is due on Losartan/Hctz 50/12.5 and Atorvastatin 20 mg.Mr. Pasquarelli is showing past due under [place].   [facility] Management Direct Dial [phone]  Fax [phone] [email or web address]

## 2017-11-22 ENCOUNTER — Ambulatory Visit (HOSPITAL_COMMUNITY)
Admission: RE | Admit: 2017-11-22 | Discharge: 2017-11-22 | Disposition: A | Discharge status: Home / Self Care | Payer: Medicare Other | Source: Ambulatory Visit | Attending: Vascular Surgery | Admitting: Vascular Surgery

## 2017-11-22 DIAGNOSIS — I872 Venous insufficiency (chronic) (peripheral): Secondary | ICD-10-CM | POA: Insufficient documentation

## 2017-11-22 DIAGNOSIS — R609 Edema, unspecified: Secondary | ICD-10-CM | POA: Insufficient documentation

## 2017-11-23 ENCOUNTER — Encounter (HOSPITAL_COMMUNITY)
Admission: RE | Admit: 2017-11-23 | Discharge: 2017-11-23 | Disposition: A | Discharge status: Home / Self Care | Payer: Medicare Other | Source: Ambulatory Visit | Attending: Nephrology | Admitting: Nephrology

## 2017-11-23 ENCOUNTER — Encounter (HOSPITAL_COMMUNITY): Payer: Self-pay

## 2017-11-23 DIAGNOSIS — E1101 Type 2 diabetes mellitus with hyperosmolarity with coma: Secondary | ICD-10-CM | POA: Insufficient documentation

## 2017-11-23 DIAGNOSIS — E7521 Fabry (-Anderson) disease: Secondary | ICD-10-CM | POA: Insufficient documentation

## 2017-11-23 LAB — URINALYSIS, ROUTINE W REFLEX MICROSCOPIC
Bacteria, UA: NONE SEEN
Bilirubin Urine: NEGATIVE
Glucose, UA: NEGATIVE mg/dL
Hgb urine dipstick: NEGATIVE
Ketones, ur: NEGATIVE mg/dL
Nitrite: POSITIVE — AB
Protein, ur: 100 mg/dL — AB
Specific Gravity, Urine: 1.009 (ref 1.005–1.030)
WBC, UA: >50 WBC/hpf — ABNORMAL HIGH (ref 0–5)
pH: 5 (ref 5.0–8.0)

## 2017-11-23 LAB — COMPREHENSIVE METABOLIC PANEL
ALT: 22 U/L (ref 17–63)
AST: 29 U/L (ref 15–41)
Albumin: 3.2 g/dL — ABNORMAL LOW (ref 3.5–5.0)
Alkaline Phosphatase: 71 U/L (ref 38–126)
Anion gap: 7 (ref 5–15)
BUN: 24 mg/dL — ABNORMAL HIGH (ref 6–20)
CO2: 26 mmol/L (ref 22–32)
Calcium: 9 mg/dL (ref 8.9–10.3)
Chloride: 109 mmol/L (ref 101–111)
Creatinine, Ser: 1.79 mg/dL — ABNORMAL HIGH (ref 0.61–1.24)
GFR calc Af Amer: 47 mL/min — ABNORMAL LOW (ref >60)
GFR calc non Af Amer: 41 mL/min — ABNORMAL LOW (ref >60)
Glucose, Bld: 160 mg/dL — ABNORMAL HIGH (ref 65–99)
Potassium: 3.8 mmol/L (ref 3.5–5.1)
Sodium: 142 mmol/L (ref 135–145)
Total Bilirubin: 0.5 mg/dL (ref 0.3–1.2)
Total Protein: 7.3 g/dL (ref 6.5–8.1)

## 2017-11-23 LAB — PHOSPHORUS: Phosphorus: 4.3 mg/dL (ref 2.5–4.6)

## 2017-11-23 MED ORDER — SODIUM CHLORIDE 0.9 % IV SOLN
115.0000 mg | INTRAVENOUS | Status: DC
  Administered 2017-11-23: 115 mg via INTRAVENOUS
  Filled 2017-11-23: qty 21

## 2017-11-23 MED ORDER — SODIUM CHLORIDE 0.9 % IV SOLN
Freq: Once | INTRAVENOUS | Status: AC
  Administered 2017-11-23: 11:00:00 via INTRAVENOUS

## 2017-11-23 NOTE — Discharge Instructions (Signed)
°  Fabrazyme ° °Agalsidase Beta injection °What is this medicine? °AGALSIDASE BETA is used to replace an enzyme that is missing in patients with Fabry disease. It is not a cure. °This medicine may be used for other purposes; ask your health care provider or pharmacist if you have questions. °COMMON BRAND NAME(S): Fabrazyme °What should I tell my health care provider before I take this medicine? °They need to know if you have any of these conditions: °-heart disease °-an unusual or allergic reaction to agalsidase beta, mannitol, other medicines, foods, dyes, or preservatives °-pregnant or trying to get pregnant °-breast-feeding °How should I use this medicine? °This medicine is for infusion into a vein. It is given by a health care professional in a hospital or clinic setting. °Talk to your pediatrician regarding the use of this medicine in children. Special care may be needed. °Overdosage: If you think you have taken too much of this medicine contact a poison control center or emergency room at once. °NOTE: This medicine is only for you. Do not share this medicine with others. °What if I miss a dose? °It is important not to miss your dose. Call your doctor or health care professional if you are unable to keep an appointment. °What may interact with this medicine? °-amiodarone °-chloroquine °-gentamicin °-hydroxychloroquine °-monobenzone °This list may not describe all possible interactions. Give your health care provider a list of all the medicines, herbs, non-prescription drugs, or dietary supplements you use. Also tell them if you smoke, drink alcohol, or use illegal drugs. Some items may interact with your medicine. °What should I watch for while using this medicine? °Visit your doctor or health care professional for regular checks on your progress. Tell your doctor or healthcare professional if your symptoms do not start to get better or if they get worse. °There is a registry for patients with Fabry disease. The  registry is used to gather information about the disease and its effects. Talk to your health care provider if you would like to join the registry. °What side effects may I notice from receiving this medicine? °Side effects that you should report to your doctor or health care professional as soon as possible: °-allergic reactions like skin rash, itching or hives, swelling of the face, lips, or tongue °-breathing problems °-chest pain, tightness °-depression °-dizziness °-fast, irregular heart beat °-swelling of the arms or legs °Side effects that usually do not require medical attention (report to your doctor or health care professional if they continue or are bothersome): °-aches or pains °-anxiety °-fever or chills at the time of injection °-headache °-nausea, vomiting °-stomach pain, upset °This list may not describe all possible side effects. Call your doctor for medical advice about side effects. You may report side effects to FDA at 1-800-FDA-1088. °Where should I keep my medicine? °This drug is given in a hospital or clinic and will not be stored at home. °NOTE: This sheet is a summary. It may not cover all possible information. If you have questions about this medicine, talk to your doctor, pharmacist, or health care provider. °© 2018 Elsevier/Gold Standard (2006-01-31 12:25:00) ° °

## 2017-11-25 ENCOUNTER — Encounter: Payer: Self-pay | Admitting: Vascular Surgery

## 2017-11-25 ENCOUNTER — Ambulatory Visit: Payer: Medicare Other | Admitting: Vascular Surgery

## 2017-11-25 ENCOUNTER — Encounter: Payer: Medicare Other | Admitting: Vascular Surgery

## 2017-11-25 VITALS — BP 147/85 | HR 64 | Resp 16 | Ht 70.0 in | Wt 296.0 lb

## 2017-11-25 DIAGNOSIS — R609 Edema, unspecified: Secondary | ICD-10-CM | POA: Diagnosis not present

## 2017-11-25 NOTE — Progress Notes (Signed)
[name], male   DOB: [birth year], 57 y.o.   MRN: [id number]  Reason for Consult: Leg Swelling   Referred by Ladell Pier, MD  Subjective:     HPI:  [name] is a 57 y.o. male with history of bilateral lower extremity swelling that has been going on for many years.  He states that he initially noted left lower extremity swelling after injuring his ankle multiple years ago walking in [place] where he worked.  He now has chronic kidney disease and is on Lasix as well.  He has a history of diabetes and hypertension.  He does not have any history of blood clots for which she is taking blood thinners currently he only takes aspirin antiplatelet medicine.  He has not had other leg trauma.  He does have leg problems which do help the swelling but only for short time.  He is not a candidate for compression stockings he has been told secondary to the size of his legs.  He is able to walk attempts to remain active.  He began working out using the treadmill in April but he has not seen much weight loss since this time.  States that he is also attempting to eat better and avoid sodium in his diet.  Past Medical History:  Diagnosis Date  . Bell's palsy   . Bifascicular block   . Chronic bronchitis ([place])    "seasonal; get it q yr"  . Chronic diastolic CHF (congestive heart failure) ([place]) 2010  . Chronic kidney disease (CKD), stage II (mild)    stage II to III/notes 07/23/2014  . Diastolic heart failure secondary to hypertrophic cardiomyopathy ([place]) CARDIOLOGIST-- DR Daneen Schick  . Dyspnea    increased exertion   . Edema 07/2013  . 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 Gout, arthritis 2014   bil feet. right worse  . History of cellulitis of skin with lymphangitis LEFT LEG  . HOCM (hypertrophic obstructive cardiomyopathy) ([place])    a. Echo 11/16: Severe LVH, EF 55-60%, abnormal GLS consistent with HOCM, no SAM, mild LAE    . Hypertension 20 years  . Lymphedema of lower extremity LEFT  >  RIGHT   "using ankle high socks at home; pump doesn't work for me" (07/23/2014)  . Pacemaker    02/12/11  . Pneumonia 08/2011  . Seasonal asthma NO INHALERS   30 years  . Second degree Mobitz II AV block    with syncope, s/p PPM  . Short of breath on exertion   . Type II diabetes mellitus ([place]) 2012   INSULIN DEPENDENT   Family History  Problem Relation Age of Onset  . Stroke Mother   . Colon cancer Neg Hx    Past Surgical History:  Procedure Laterality Date  . CARDIAC CATHETERIZATION  03-12-2011  DR Daneen Schick   HYPERTROPHIC CARDIOMYOPATHY WITH LV CAVITY  APPEARANCE CONSISTANT WITH SIGNIFICANT APICAL HYPERTROPHY/ NORMAL LVSF / EF 55%/ EVIDENCE OF DIASTOLIC DYSFUNCTION WITH EDP OF 23-63mmHg AFTER A-WAVE/ NORMAL CORONARY ARTERIES  . EVALUATION UNDER ANESTHESIA WITH FISTULECTOMY N/A 06/23/2015   Procedure: EXAM UNDER ANESTHESIA WITH POSSIBLE FISTULOTOMY;  Surgeon: Judeth Horn, MD;  Location: [facility];  Service: General;  Laterality: N/A;  . HERNIA REPAIR  [id number]   Umbilical Hernia Repair  . INCISION AND DRAINAGE PERIRECTAL ABSCESS Left 07/29/2014   Procedure: IRRIGATION AND DEBRIDEMENT PERIRECTAL ABSCESS;  Surgeon: Doreen Salvage, MD;  [facility];  Service: General;  Laterality: Left;  Prone position  . INSERT / REPLACE / REMOVE PACEMAKER  02/12/2011   SJM implanted by Dr Rayann Heman for Mobitz II second degree AV block and syncope  . IRRIGATION AND DEBRIDEMENT ABSCESS N/A 07/27/2013   Procedure: IRRIGATION AND DEBRIDEMENT OF SKIN, SOFT TISSUE AND MUSCLES OF UPPER BACK (11X19X4cm) WITH 10 BLADE AND PULSATILE LAVAGE ;  Surgeon: Gayland Curry, MD;  Location: [facility];  Service: General;  Laterality: N/A;  . UMBILICAL HERNIA REPAIR  03/22/2012   Procedure: HERNIA REPAIR UMBILICAL ADULT;  Surgeon: Leighton Ruff, MD;  [facility];  Service: General;  Laterality: N/A;  Umbilical Hernia Repair     Short Social History:  Social History    Tobacco Use  . Smoking status: Never Smoker  . Smokeless tobacco: Never Used  Substance Use Topics  . Alcohol use: Yes    Comment: 07/23/2014 "might have a few drinks on holidays or at cookouts"    Allergies  Allergen Reactions  . Shellfish Allergy Hives and Swelling  . Lisinopril     Current Outpatient Medications  Medication Sig Dispense Refill  . ACCU-CHEK AVIVA PLUS test strip TEST 3 TIMES DAILY. ICD 10 E11.8 100 each 12  . ACCU-CHEK FASTCLIX LANCETS MISC USE TO TEST BLOOD SUGAR 3 TO 4 TIMES DAILY 102 each 5  . acetaminophen (TYLENOL) 500 MG tablet Take 500 mg by mouth every 14 (fourteen) days. Only taking 1 hour before infusion.  AS premed for Fabrazyme    . Agalsidase beta (FABRAZYME IV) Inject 115 mg into the vein every 14 (fourteen) days.     . ALBUTEROL SULFATE HFA IN Inhale 1 puff into the lungs daily as needed (shortness of breath or wheezing). For shortness of breath    . allopurinol (ZYLOPRIM) 100 MG tablet Take 100 mg by mouth 3 (three) times daily.     [name] Kitchen amLODipine (NORVASC) 5 MG tablet Take 5 mg by mouth daily.    [name] Kitchen aspirin 81 MG tablet Take 81 mg by mouth daily.     [name] Kitchen atorvastatin (LIPITOR) 20 MG tablet Take 1 tablet (20 mg total) by mouth daily. 30 tablet 5  . fexofenadine (ALLEGRA) 180 MG tablet Take 180 mg by mouth every 14 (fourteen) days.     . fluticasone (FLONASE) 50 MCG/ACT nasal spray Place 1 spray into both nostrils daily. 16 g 1  . furosemide (LASIX) 20 MG tablet Take 1 tablet (20 mg total) by mouth daily. Lower extremiity edema 30 tablet 3  . HUMALOG MIX 75/25 KWIKPEN (75-25) 100 UNIT/ML Kwikpen INJECT 20 UNITS INTO THE SKIN 2 (TWO) TIMES DAILY. 15 pen 2  . hydrocortisone valerate cream (WESTCORT) 0.2 % Apply to affected area daily PRN 45 g 0  . Insulin Pen Needle (PX SHORTLENGTH PEN NEEDLES) 31G X 8 MM MISC 1 each by Does not apply route 2 (two) times daily before a meal. 100 each 8  . losartan-hydrochlorothiazide (HYZAAR) 50-12.5 MG tablet Take 1  tablet by mouth daily. 30 tablet 5  . OLIVE LEAF PO Take 1 tablet by mouth 2 (two) times daily.      No current facility-administered medications for this visit.     Review of Systems  Constitutional:  Constitutional negative. HENT: HENT negative.  Eyes: Eyes negative.  Cardiovascular: Positive for leg swelling.  GI: Gastrointestinal negative.  Musculoskeletal: Positive for leg pain.  Skin: Skin negative.  Neurological: Neurological negative. Hematologic: Hematologic/lymphatic negative.  Psychiatric: Psychiatric negative.  Objective:  Objective   Vitals:   11/25/17 0846  BP: (!) 147/85  Pulse: 64  Resp: 16  SpO2: 96%  Weight: 296 lb (134.3 kg)  Height: 5\' 10"  (1.778 m)   Body mass index is 42.47 kg/m.  Physical Exam  Constitutional: He is oriented to person, place, and time. He appears well-developed.  HENT:  Head: Normocephalic.  Eyes: Pupils are equal, round, and reactive to light.  Neck: Normal range of motion.  Cardiovascular: Normal rate.  Pulses:      Carotid pulses are 2+ on the right side, and 2+ on the left side.      Radial pulses are 2+ on the right side, and 2+ on the left side.  Pulmonary/Chest: Effort normal.  Abdominal: Soft.  Musculoskeletal: He exhibits edema.  Neurological: He is alert and oriented to person, place, and time.  Psychiatric: He has a normal mood and affect. His behavior is normal. Judgment and thought content normal.    Data: I have reviewed his lower extremity venous reflux study which demonstrates deep venous reflux in the common femoral and femoral veins bilaterally as well as the popliteal on the right.  His greater saphenous vein on the right has reflux from the saphenofemoral junction to the knee and on the left at the saphenofemoral junction only.  The size on the right is 0.76 cm and on the left is 0.67 cm but they get more diminutive throughout the leg from there.     Assessment/Plan:     57 year old male who  has apparent lymphedema of his bilateral lower extremities which is actually quite impressive.  He does use pump therapy at home but this only helps transiently and he has not been able to find compression stockings at work.  I discussed with him the need to first of all continue activity we need to get him in compression stockings and I have given him information for elastic therapy where hopefully they can get him fitted.  He also needs to consider weight loss and he is begun to work out and I have encouraged him that this will give him more relief than nearly anything else that he could do.  It is possible he cannot lose weight we will need to refer him to a bariatric surgeon for consideration given his BMI of 42 with significant comorbidities.  His saphenous veins are unlikely the cause of his significant swelling and we discussed this at this time I would not think any procedures would necessarily help.  He does demonstrate good understanding and follow up with me on a as needed basis.     Waynetta Sandy MD [name]

## 2017-11-28 ENCOUNTER — Ambulatory Visit: Payer: Medicare Other | Admitting: Neurology

## 2017-11-28 ENCOUNTER — Encounter: Payer: Self-pay | Admitting: Neurology

## 2017-11-28 VITALS — BP 140/80 | HR 88 | Ht 70.0 in | Wt 293.0 lb

## 2017-11-28 DIAGNOSIS — E7521 Fabry (-Anderson) disease: Secondary | ICD-10-CM | POA: Diagnosis not present

## 2017-11-28 NOTE — Progress Notes (Signed)
[name] DOB: 1961/03/17  Chief Complaint  Patient presents with  . Fabry disease    He would like to further review his test results and discuss treatment options for his symptoms.     HISTORICAL  [name] is a 57 year old male, seen in refer by his nephrologist Dr. Donato Heinz for evaluation of Fabry disease, potential cerebrovascular risk, initial evaluation was on August 18, 2017.  I reviewed and summarized the referring note, he has history of Fabry's disease, hypertension, insulin-dependent diabetes, obstructive sleep apnea, Mobitz 2, status post pacemaker placement in September 2012, chronic lympha edema of bilateral lower extremity, gout.  His mother and one younger brother was diagnosed with Fabry's disease around 2010, at that time, he presented with progressive worsening bilateral lower extremity lympha edema, he was referred to [facility] for genetic testing, which confirmed a diagnosis of Fabry disease.  He has been treated with Fabrazyme IV infusion every 2 weeks since then.  His brother did suffered painful bilateral lower extremity peripheral neuropathy, he denied significant pain, since he started Fabrazyme IV infusion, his bilateral lower extremity lymphedema has been fairly stable, his kidney function fluctuate, but overall creatinine was stabilized around 1.6 in Apr 29, 2017,  His mother died of stroke at age 73, brother died of stroke at age 70,   He is taking aspirin 81 mg, not MRI candidate due to pacemaker, denies history of strokelike symptoms, he does exercise couple times each week.  Reported previous sleep study showed mild abnormality, but not using CPAP machine, he is active during the day, tends to nod off to sleep after sitting for a while, deny snoring, choking episode.  UPDATE November 28 2017: Follow-up for fabric disease, He enjoyed the gym, he is receiving enzyme infusion at [facility] every 2 weeks,  Echocardiogram  on August 25, 2017 showed ejection fraction 55 to 60%, wall motion was normal  [place] of carotid artery showed less than 39% stenosis bilaterally.   REVIEW OF SYSTEMS: Full 14 system review of systems performed and notable only for shortness of breath, wheezing, swelling in legs, ringing the ears, allergy  ALLERGIES: Allergies  Allergen Reactions  . Shellfish Allergy Hives and Swelling  . Lisinopril     HOME MEDICATIONS: Current Outpatient Medications  Medication Sig Dispense Refill  . ACCU-CHEK AVIVA PLUS test strip TEST 3 TIMES DAILY. ICD 10 E11.8 100 each 12  . ACCU-CHEK FASTCLIX LANCETS MISC USE TO TEST BLOOD SUGAR 3 TO 4 TIMES DAILY 102 each 5  . acetaminophen (TYLENOL) 500 MG tablet Take 500 mg by mouth every 14 (fourteen) days. Only taking 1 hour before infusion.  AS premed for Fabrazyme    . Agalsidase beta (FABRAZYME IV) Inject 115 mg into the vein every 14 (fourteen) days.     . ALBUTEROL SULFATE HFA IN Inhale 1 puff into the lungs daily as needed (shortness of breath or wheezing). For shortness of breath    . allopurinol (ZYLOPRIM) 100 MG tablet Take 100 mg by mouth 3 (three) times daily.     [name] Kitchen amLODipine (NORVASC) 5 MG tablet Take 5 mg by mouth daily.    [name] Kitchen aspirin 81 MG tablet Take 81 mg by mouth daily.     [name] Kitchen atorvastatin (LIPITOR) 20 MG tablet Take 1 tablet (20 mg total) by mouth daily. 30 tablet 5  . fexofenadine (ALLEGRA) 180 MG tablet Take 180 mg by mouth every 14 (fourteen) days.     . fluticasone (FLONASE) 50  MCG/ACT nasal spray Place 1 spray into both nostrils daily. 16 g 1  . furosemide (LASIX) 20 MG tablet Take 1 tablet (20 mg total) by mouth daily. Lower extremiity edema 30 tablet 3  . HUMALOG MIX 75/25 KWIKPEN (75-25) 100 UNIT/ML Kwikpen INJECT 20 UNITS INTO THE SKIN 2 (TWO) TIMES DAILY. 15 pen 2  . hydrocortisone valerate cream (WESTCORT) 0.2 % Apply to affected area daily PRN 45 g 0  . Insulin Pen Needle (PX SHORTLENGTH PEN NEEDLES) 31G X 8 MM MISC 1 each by Does  not apply route 2 (two) times daily before a meal. 100 each 8  . losartan-hydrochlorothiazide (HYZAAR) 50-12.5 MG tablet Take 1 tablet by mouth daily. 30 tablet 5  . OLIVE LEAF PO Take 1 tablet by mouth 2 (two) times daily.      No current facility-administered medications for this visit.     PAST MEDICAL HISTORY: Past Medical History:  Diagnosis Date  . Bell's palsy   . Bifascicular block   . Chronic bronchitis ([place])    "seasonal; get it q yr"  . Chronic diastolic CHF (congestive heart failure) ([place]) 2010  . Chronic kidney disease (CKD), stage II (mild)    stage II to III/notes 07/23/2014  . Diastolic heart failure secondary to hypertrophic cardiomyopathy ([place]) CARDIOLOGIST-- DR Daneen Schick  . Dyspnea    increased exertion   . Edema 07/2013  . Fabry's disease ([employer]) RENAL AND CARDIAC INVOVLEMENT   FOLLOWED DR [place]  . Gout, arthritis 2014   bil feet. right worse  . History of cellulitis of skin with lymphangitis LEFT LEG  . HOCM (hypertrophic obstructive cardiomyopathy) ([place])    a. Echo 11/16: Severe LVH, EF 55-60%, abnormal GLS consistent with HOCM, no SAM, mild LAE  . Hypertension 20 years  . Lymphedema of lower extremity LEFT  >  RIGHT   "using ankle high socks at home; pump doesn't work for me" (07/23/2014)  . Pacemaker    02/12/11  . Pneumonia 08/2011  . Seasonal asthma NO INHALERS   30 years  . Second degree Mobitz II AV block    with syncope, s/p PPM  . Short of breath on exertion   . Type II diabetes mellitus ([place]) 2012   INSULIN DEPENDENT    PAST SURGICAL HISTORY: Past Surgical History:  Procedure Laterality Date  . CARDIAC CATHETERIZATION  03-12-2011  DR Daneen Schick   HYPERTROPHIC CARDIOMYOPATHY WITH LV CAVITY  APPEARANCE CONSISTANT WITH SIGNIFICANT APICAL HYPERTROPHY/ NORMAL LVSF / EF 55%/ EVIDENCE OF DIASTOLIC DYSFUNCTION WITH EDP OF 23-77mmHg AFTER A-WAVE/ NORMAL CORONARY ARTERIES  . EVALUATION UNDER ANESTHESIA WITH FISTULECTOMY N/A 06/23/2015   Procedure:  EXAM UNDER ANESTHESIA WITH POSSIBLE FISTULOTOMY;  Surgeon: Judeth Horn, MD;  Location: [place];  Service: General;  Laterality: N/A;  . HERNIA REPAIR  [id number]   Umbilical Hernia Repair  . INCISION AND DRAINAGE PERIRECTAL ABSCESS Left 07/29/2014   Procedure: IRRIGATION AND DEBRIDEMENT PERIRECTAL ABSCESS;  Surgeon: Doreen Salvage, MD;  Location: [place];  Service: General;  Laterality: Left;  Prone position  . INSERT / REPLACE / REMOVE PACEMAKER  02/12/2011   SJM implanted by Dr Rayann Heman for Mobitz II second degree AV block and syncope  . IRRIGATION AND DEBRIDEMENT ABSCESS N/A 07/27/2013   Procedure: IRRIGATION AND DEBRIDEMENT OF SKIN, SOFT TISSUE AND MUSCLES OF UPPER BACK (11X19X4cm) WITH 10 BLADE AND PULSATILE LAVAGE ;  Surgeon: Gayland Curry, MD;  Location: [place];  Service: General;  Laterality: N/A;  . UMBILICAL HERNIA REPAIR  03/22/2012   Procedure: HERNIA REPAIR UMBILICAL ADULT;  Surgeon: Leighton Ruff, MD;  Location: [facility];  Service: General;  Laterality: N/A;  Umbilical Hernia Repair     FAMILY HISTORY: Family History  Problem Relation Age of Onset  . Stroke Mother   . Colon cancer Neg Hx     SOCIAL HISTORY:  Social History   Socioeconomic History  . Marital status: Divorced    Spouse name: Not on file  . Number of children: Not on file  . Years of education: Not on file  . Highest education level: Not on file  Occupational History  . Not on file  Social Needs  . Financial resource strain: Not on file  . Food insecurity:    Worry: Not on file    Inability: Not on file  . Transportation needs:    Medical: Not on file    Non-medical: Not on file  Tobacco Use  . Smoking status: Never Smoker  . Smokeless tobacco: Never Used  Substance and Sexual Activity  . Alcohol use: Yes    Comment: 07/23/2014 "might have a few drinks on holidays or at cookouts"  . Drug use: No  . Sexual activity: Yes  Lifestyle  . Physical activity:    Days per week: Not on file    Minutes per session: Not on  file  . Stress: Not on file  Relationships  . Social connections:    Talks on phone: Not on file    Gets together: Not on file    Attends religious service: Not on file    Active member of club or organization: Not on file    Attends meetings of clubs or organizations: Not on file    Relationship status: Not on file  . Intimate partner violence:    Fear of current or ex partner: Not on file    Emotionally abused: Not on file    Physically abused: Not on file    Forced sexual activity: Not on file  Other Topics Concern  . Not on file  Social History Narrative   Coaches [employer]      PHYSICAL EXAM   Vitals:   11/28/17 0933  BP: 140/80  Pulse: 88  Weight: 293 lb (132.9 kg)  Height: 5\' 10"  (1.778 m)    Not recorded      Body mass index is 42.04 kg/m.  PHYSICAL EXAMNIATION:  Gen: NAD, conversant, well nourised, obese, well groomed                     Cardiovascular: Regular rate rhythm, no peripheral edema, warm, nontender. Eyes: Conjunctivae clear without exudates or hemorrhage Neck: Supple, no carotid bruits. Pulmonary: Clear to auscultation bilaterally   NEUROLOGICAL EXAM:  MENTAL STATUS: Speech:    Speech is normal; fluent and spontaneous with normal comprehension.  Cognition:     Orientation to time, place and person     Normal recent and remote memory     Normal Attention span and concentration     Normal Language, naming, repeating,spontaneous speech     Fund of knowledge   CRANIAL NERVES: CN II: Visual fields are full to confrontation. Fundoscopic exam is normal with sharp discs and no vascular changes. Pupils are round equal and briskly reactive to light. CN III, IV, VI: extraocular movement are normal. No ptosis. CN V: Facial sensation is intact to pinprick in all 3 divisions bilaterally. Corneal responses are intact.  CN VII: Face is symmetric with  normal eye closure and smile. CN VIII: Hearing is normal to rubbing fingers CN IX, X: Palate  elevates symmetrically. Phonation is normal. CN XI: Head turning and shoulder shrug are intact CN XII: Tongue is midline with normal movements and no atrophy.  MOTOR: He has bilateral lower extremity lymphedema, thick skin, no significant weakness,.  REFLEXES: Reflexes are areflexia at limb plantar responses are flexor.  SENSORY: Intact to light touch, pinprick, positional sensation and vibratory sensation are intact in fingers and toes.  COORDINATION: Rapid alternating movements and fine finger movements are intact. There is no dysmetria on finger-to-nose and heel-knee-shin.    GAIT/STANCE: Posture is normal. Gait is steady with normal steps,   DIAGNOSTIC DATA (LABS, IMAGING, TESTING) - I reviewed patient records, labs, notes, testing and imaging myself where available.   ASSESSMENT AND PLAN  [name] is a 57 y.o. male   Fabry's Disease.  Multiple vascular risk factors, HTN, HLD, DM, obesity  Keep ASA 81mg    Increase water intake,   Moderate exercise,      [name], M.D. Ph.D.  [facility] [address] [place], [place] [address] Ph: [phone] Fax: [phone]  [place]:[email or web address], Broadus John, MD

## 2017-12-07 ENCOUNTER — Encounter (HOSPITAL_COMMUNITY): Payer: Self-pay

## 2017-12-07 ENCOUNTER — Encounter (HOSPITAL_COMMUNITY)
Admission: RE | Admit: 2017-12-07 | Discharge: 2017-12-07 | Disposition: A | Discharge status: Home / Self Care | Payer: Medicare Other | Source: Ambulatory Visit | Attending: Nephrology | Admitting: Nephrology

## 2017-12-07 DIAGNOSIS — E7521 Fabry (-Anderson) disease: Secondary | ICD-10-CM | POA: Diagnosis not present

## 2017-12-07 DIAGNOSIS — E1101 Type 2 diabetes mellitus with hyperosmolarity with coma: Secondary | ICD-10-CM | POA: Diagnosis not present

## 2017-12-07 MED ORDER — SODIUM CHLORIDE 0.9 % IV SOLN
115.0000 mg | INTRAVENOUS | Status: DC
  Administered 2017-12-07: 115 mg via INTRAVENOUS
  Filled 2017-12-07: qty 21

## 2017-12-07 MED ORDER — SODIUM CHLORIDE 0.9 % IV SOLN
INTRAVENOUS | Status: AC
  Administered 2017-12-07: 09:00:00 via INTRAVENOUS

## 2017-12-07 NOTE — Discharge Instructions (Signed)
°  Fabrazyme  Agalsidase Beta injection What is this medicine? AGALSIDASE BETA is used to replace an enzyme that is missing in patients with Fabry disease. It is not a cure. This medicine may be used for other purposes; ask your health care provider or pharmacist if you have questions. COMMON BRAND NAME(S): Fabrazyme What should I tell my health care provider before I take this medicine? They need to know if you have any of these conditions: -heart disease -an unusual or allergic reaction to agalsidase beta, mannitol, other medicines, foods, dyes, or preservatives -pregnant or trying to get pregnant -breast-feeding How should I use this medicine? This medicine is for infusion into a vein. It is given by a health care professional in a hospital or clinic setting. Talk to your pediatrician regarding the use of this medicine in children. Special care may be needed. Overdosage: If you think you have taken too much of this medicine contact a poison control center or emergency room at once. NOTE: This medicine is only for you. Do not share this medicine with others. What if I miss a dose? It is important not to miss your dose. Call your doctor or health care professional if you are unable to keep an appointment. What may interact with this medicine? -amiodarone -chloroquine -gentamicin -hydroxychloroquine -monobenzone This list may not describe all possible interactions. Give your health care provider a list of all the medicines, herbs, non-prescription drugs, or dietary supplements you use. Also tell them if you smoke, drink alcohol, or use illegal drugs. Some items may interact with your medicine. What should I watch for while using this medicine? Visit your doctor or health care professional for regular checks on your progress. Tell your doctor or healthcare professional if your symptoms do not start to get better or if they get worse. There is a registry for patients with Fabry disease. The  registry is used to gather information about the disease and its effects. Talk to your health care provider if you would like to join the registry. What side effects may I notice from receiving this medicine? Side effects that you should report to your doctor or health care professional as soon as possible: -allergic reactions like skin rash, itching or hives, swelling of the face, lips, or tongue -breathing problems -chest pain, tightness -depression -dizziness -fast, irregular heart beat -swelling of the arms or legs Side effects that usually do not require medical attention (report to your doctor or health care professional if they continue or are bothersome): -aches or pains -anxiety -fever or chills at the time of injection -headache -nausea, vomiting -stomach pain, upset This list may not describe all possible side effects. Call your doctor for medical advice about side effects. You may report side effects to FDA at 1-800-FDA-1088. Where should I keep my medicine? This drug is given in a hospital or clinic and will not be stored at home. NOTE: This sheet is a summary. It may not cover all possible information. If you have questions about this medicine, talk to your doctor, pharmacist, or health care provider.  2018 Elsevier/Gold Standard (2006-01-31 12:25:00)

## 2017-12-13 ENCOUNTER — Other Ambulatory Visit: Payer: Self-pay

## 2017-12-13 NOTE — Patient Outreach (Signed)
[facility]) Care Management  12/13/2017  [name] 03-14-1961 [id number]   Medication Adherence call to [name] left a message for patient to call back patient is due on Atorvastatin 20 mg and Losartan /Hctz 50/12.5 mg. [name] is showing past due under [place].  [place] Management Direct Dial [phone]  Fax [phone] [email or web address]

## 2017-12-18 ENCOUNTER — Other Ambulatory Visit: Payer: Self-pay | Admitting: Family Medicine

## 2017-12-23 ENCOUNTER — Encounter (HOSPITAL_COMMUNITY): Payer: Medicare Other

## 2018-01-02 ENCOUNTER — Telehealth: Payer: Self-pay | Admitting: Pharmacist

## 2018-01-02 MED ORDER — ACCU-CHEK FASTCLIX LANCETS MISC: 5 refills | Status: DC

## 2018-01-02 NOTE — Telephone Encounter (Signed)
[facility] [address] for accu-chek fastclix lancets. Rx sent in.

## 2018-01-06 ENCOUNTER — Encounter (HOSPITAL_COMMUNITY)
Admission: RE | Admit: 2018-01-06 | Discharge: 2018-01-06 | Disposition: A | Discharge status: Home / Self Care | Payer: Medicare Other | Source: Ambulatory Visit | Attending: Nephrology | Admitting: Nephrology

## 2018-01-06 ENCOUNTER — Encounter (HOSPITAL_COMMUNITY): Payer: Self-pay

## 2018-01-06 DIAGNOSIS — E1101 Type 2 diabetes mellitus with hyperosmolarity with coma: Secondary | ICD-10-CM | POA: Insufficient documentation

## 2018-01-06 DIAGNOSIS — E7521 Fabry (-Anderson) disease: Secondary | ICD-10-CM | POA: Diagnosis not present

## 2018-01-06 LAB — COMPREHENSIVE METABOLIC PANEL
ALT: 15 U/L (ref 0–44)
AST: 28 U/L (ref 15–41)
Albumin: 3.1 g/dL — ABNORMAL LOW (ref 3.5–5.0)
Alkaline Phosphatase: 62 U/L (ref 38–126)
Anion gap: 8 (ref 5–15)
BUN: 22 mg/dL — ABNORMAL HIGH (ref 6–20)
CO2: 26 mmol/L (ref 22–32)
Calcium: 8.8 mg/dL — ABNORMAL LOW (ref 8.9–10.3)
Chloride: 108 mmol/L (ref 98–111)
Creatinine, Ser: 1.84 mg/dL — ABNORMAL HIGH (ref 0.61–1.24)
GFR calc Af Amer: 46 mL/min — ABNORMAL LOW (ref >60)
GFR calc non Af Amer: 39 mL/min — ABNORMAL LOW (ref >60)
Glucose, Bld: 115 mg/dL — ABNORMAL HIGH (ref 70–99)
Potassium: 4.6 mmol/L (ref 3.5–5.1)
Sodium: 142 mmol/L (ref 135–145)
Total Bilirubin: 0.7 mg/dL (ref 0.3–1.2)
Total Protein: 7 g/dL (ref 6.5–8.1)

## 2018-01-06 LAB — URINALYSIS, ROUTINE W REFLEX MICROSCOPIC
Bilirubin Urine: NEGATIVE
Glucose, UA: NEGATIVE mg/dL
Hgb urine dipstick: NEGATIVE
Ketones, ur: NEGATIVE mg/dL
Nitrite: POSITIVE — AB
Protein, ur: 100 mg/dL — AB
Specific Gravity, Urine: 1.009 (ref 1.005–1.030)
WBC, UA: >50 WBC/hpf — ABNORMAL HIGH (ref 0–5)
pH: 6 (ref 5.0–8.0)

## 2018-01-06 LAB — PHOSPHORUS: Phosphorus: 3.9 mg/dL (ref 2.5–4.6)

## 2018-01-06 MED ORDER — SODIUM CHLORIDE 0.9 % IV SOLN
115.0000 mg | INTRAVENOUS | Status: DC
  Administered 2018-01-06: 115 mg via INTRAVENOUS
  Filled 2018-01-06: qty 21

## 2018-01-06 MED ORDER — SODIUM CHLORIDE 0.9 % IV SOLN
INTRAVENOUS | Status: AC
  Administered 2018-01-06: 08:00:00 via INTRAVENOUS

## 2018-01-06 NOTE — Discharge Instructions (Signed)
Fabry Disease Fabry disease is a rare genetic disorder. A gene defect (mutation) alters a protein, called an enzyme, that normally breaks down a particular fat (globotriaosylceramide). The mutation keeps the enzyme from breaking down the fat. This causes excess fat to build up in the blood vessels (vasculature) of many organs of the body. Over time, this results in less blood flow and fewer nutrients getting to the body's organs. The buildup can also wear away at nerve fibers. That causes pain and other symptoms. The areas most often affected are the hands, feet, skin, eyes, and digestive system. Eventually the fatty deposits can affect your cardiovascular system, nervous system, and kidneys. This can cause a heart attack, stroke, kidney damage, or other life-threatening complications. What are the causes? Fabry disease is caused by a genetic mutation. This mutation is passed down through families (inherited). What increases the risk? You may be more likely to have Fabry disease if:  You are male.  You have a family history of the disease.  What are the signs or symptoms? Symptoms of Fabry disease include:  A rash of small, raised, reddish-purple dots on the skin (angiokeratomas).  Cloudy eyes or other eye problems.  Chronic pain, especially in the hands or feet.  Burning sensations in the hands and feet.  Gastrointestinal problems, such as bloating, diarrhea, and frequent bowel movements.  Ringing in the ears (tinnitus).  Hearing loss.  Decreased sweat production (hypohidrosis) or no sweat production (anhidrosis).  Over time, untreated Fabry disease can cause:  Severe back pain in the area of the kidneys.  Heart attack.  Stroke.  How is this diagnosed? Your health care provider may suspect Fabry disease based on your symptoms and family history. Your health care provider will also perform a physical exam. This may include tests, such as:  A blood test to measure enzyme  activity in your blood (enzyme assay).  A blood test to look for mutations in the gene that causes Fabry disease.  Urine tests to check how well your kidneys are working and to check for protein in your urine.  An ultrasound of your heart (echocardiogram) to create an image of your heart and check for heart disease.  If you or your partner has Fabry disease and you are having a child, your child can be screened for the condition at birth. If the results of that test are out of the normal range, the child may need follow-up blood and urine tests. These would help confirm the diagnosis and help determine treatment. Prenatal testing for Fabry disease also can be done. How is this treated? Medicines are used to manage Fabry disease. This may include:  Enzyme replacement therapy. This medicine performs the functions of the enzyme that is missing in Fabry disease.  Medicines to treat the pain and discomfort in your hands and feet.  You may also need treatment for complications caused by Fabry Disease. These complications may be related to your kidneys or heart. Treatments may include:  Angioplasty to repair or unblock an artery.  Heart surgery.  Dialysis to filter your kidneys.  Kidney transplant.  Follow these instructions at home:  Avoid stressful situations when possible. Stress can worsen the pain related to Fabry disease.  Stay indoors when the temperatures are very hot or very cold.  Exercise carefully, as directed by your health care provider. Physical exertion can make Fabry disease worse.  Practice good hygiene to avoid illnesses that make Fabry disease worse.  Have a good support system  in place. Contact a health care provider if:  You have worsening pain.  You have worsening vision.  You have a noticeable decrease in urination.  You have swelling of your hands, feet, or lower legs that is getting worse.  You feel increasingly weak or tired. Get help right away  if:  You have trouble breathing, chest pain, or a feeling of pressure in your chest.  You have stroke symptoms. These include: ? Numbness or weakness on one side of your body. ? Droopiness on one side of your face. ? Slurred speech. ? Confusion.  You have sharp pain in the area of your kidneys. This information is not intended to replace advice given to you by your health care provider. Make sure you discuss any questions you have with your health care provider. Document Released: 05/14/2002 Document Revised: 10/30/2015 Document Reviewed: 01/16/2014 Elsevier Interactive Patient Education  [employer].

## 2018-01-17 ENCOUNTER — Other Ambulatory Visit: Payer: Self-pay | Admitting: Internal Medicine

## 2018-01-17 DIAGNOSIS — E785 Hyperlipidemia, unspecified: Secondary | ICD-10-CM

## 2018-01-20 ENCOUNTER — Encounter (HOSPITAL_COMMUNITY): Payer: Self-pay

## 2018-01-20 ENCOUNTER — Encounter (HOSPITAL_COMMUNITY)
Admission: RE | Admit: 2018-01-20 | Discharge: 2018-01-20 | Disposition: A | Discharge status: Home / Self Care | Payer: Medicare Other | Source: Ambulatory Visit | Attending: Nephrology | Admitting: Nephrology

## 2018-01-20 DIAGNOSIS — E7521 Fabry (-Anderson) disease: Secondary | ICD-10-CM | POA: Diagnosis not present

## 2018-01-20 DIAGNOSIS — E1101 Type 2 diabetes mellitus with hyperosmolarity with coma: Secondary | ICD-10-CM | POA: Diagnosis not present

## 2018-01-20 LAB — IRON AND TIBC
Iron: 65 ug/dL (ref 45–182)
Saturation Ratios: 22 % (ref 17.9–39.5)
TIBC: 295 ug/dL (ref 250–450)
UIBC: 230 ug/dL

## 2018-01-20 LAB — CBC WITH DIFFERENTIAL/PLATELET
Basophils Absolute: 0 10*3/uL (ref 0.0–0.1)
Basophils Relative: 0 %
Eosinophils Absolute: 0.6 10*3/uL (ref 0.0–0.7)
Eosinophils Relative: 8 %
HCT: 45.1 % (ref 39.0–52.0)
Hemoglobin: 15.1 g/dL (ref 13.0–17.0)
Lymphocytes Relative: 42 %
Lymphs Abs: 3.1 10*3/uL (ref 0.7–4.0)
MCH: 30.3 pg (ref 26.0–34.0)
MCHC: 33.5 g/dL (ref 30.0–36.0)
MCV: 90.6 fL (ref 78.0–100.0)
Monocytes Absolute: 0.7 10*3/uL (ref 0.1–1.0)
Monocytes Relative: 9 %
Neutro Abs: 3 10*3/uL (ref 1.7–7.7)
Neutrophils Relative %: 41 %
Platelets: 208 10*3/uL (ref 150–400)
RBC: 4.98 MIL/uL (ref 4.22–5.81)
RDW: 13 % (ref 11.5–15.5)
WBC: 7.3 10*3/uL (ref 4.0–10.5)

## 2018-01-20 LAB — FERRITIN: Ferritin: 80 ng/mL (ref 24–336)

## 2018-01-20 MED ORDER — SODIUM CHLORIDE 0.9 % IV SOLN
INTRAVENOUS | Status: AC
  Administered 2018-01-20: 09:00:00 via INTRAVENOUS

## 2018-01-20 MED ORDER — SODIUM CHLORIDE 0.9 % IV SOLN
115.0000 mg | INTRAVENOUS | Status: DC
  Administered 2018-01-20: 115 mg via INTRAVENOUS
  Filled 2018-01-20: qty 21

## 2018-01-24 ENCOUNTER — Telehealth: Payer: Self-pay | Admitting: Internal Medicine

## 2018-01-24 NOTE — Telephone Encounter (Signed)
Patient came in to drop off handicap placard (renewal form).

## 2018-02-03 ENCOUNTER — Encounter (HOSPITAL_COMMUNITY): Payer: Self-pay

## 2018-02-03 ENCOUNTER — Ambulatory Visit (HOSPITAL_COMMUNITY)
Admission: RE | Admit: 2018-02-03 | Discharge: 2018-02-03 | Disposition: A | Discharge status: Home / Self Care | Payer: Medicare Other | Source: Ambulatory Visit | Attending: Nephrology | Admitting: Nephrology

## 2018-02-03 DIAGNOSIS — E7521 Fabry (-Anderson) disease: Secondary | ICD-10-CM | POA: Diagnosis not present

## 2018-02-03 MED ORDER — SODIUM CHLORIDE 0.9 % IV SOLN
115.0000 mg | INTRAVENOUS | Status: DC
  Administered 2018-02-03: 115 mg via INTRAVENOUS
  Filled 2018-02-03: qty 21

## 2018-02-03 MED ORDER — SODIUM CHLORIDE 0.9 % IV SOLN
INTRAVENOUS | Status: AC
  Administered 2018-02-03: 11:00:00 via INTRAVENOUS

## 2018-02-17 ENCOUNTER — Encounter (HOSPITAL_COMMUNITY)
Admission: RE | Admit: 2018-02-17 | Discharge: 2018-02-17 | Disposition: A | Discharge status: Home / Self Care | Payer: Medicare Other | Source: Ambulatory Visit | Attending: Nephrology | Admitting: Nephrology

## 2018-02-17 ENCOUNTER — Encounter (HOSPITAL_COMMUNITY): Payer: Self-pay

## 2018-02-17 DIAGNOSIS — E1101 Type 2 diabetes mellitus with hyperosmolarity with coma: Secondary | ICD-10-CM | POA: Insufficient documentation

## 2018-02-17 DIAGNOSIS — E7521 Fabry (-Anderson) disease: Secondary | ICD-10-CM | POA: Diagnosis not present

## 2018-02-17 LAB — COMPREHENSIVE METABOLIC PANEL
ALT: 17 U/L (ref 0–44)
AST: 27 U/L (ref 15–41)
Albumin: 3.4 g/dL — ABNORMAL LOW (ref 3.5–5.0)
Alkaline Phosphatase: 77 U/L (ref 38–126)
Anion gap: 9 (ref 5–15)
BUN: 24 mg/dL — ABNORMAL HIGH (ref 6–20)
CO2: 27 mmol/L (ref 22–32)
Calcium: 9.3 mg/dL (ref 8.9–10.3)
Chloride: 112 mmol/L — ABNORMAL HIGH (ref 98–111)
Creatinine, Ser: 1.69 mg/dL — ABNORMAL HIGH (ref 0.61–1.24)
GFR calc Af Amer: 50 mL/min — ABNORMAL LOW (ref >60)
GFR calc non Af Amer: 43 mL/min — ABNORMAL LOW (ref >60)
Glucose, Bld: 118 mg/dL — ABNORMAL HIGH (ref 70–99)
Potassium: 4.5 mmol/L (ref 3.5–5.1)
Sodium: 148 mmol/L — ABNORMAL HIGH (ref 135–145)
Total Bilirubin: 0.4 mg/dL (ref 0.3–1.2)
Total Protein: 7.5 g/dL (ref 6.5–8.1)

## 2018-02-17 LAB — URINALYSIS, ROUTINE W REFLEX MICROSCOPIC
Bilirubin Urine: NEGATIVE
Glucose, UA: NEGATIVE mg/dL
Hgb urine dipstick: NEGATIVE
Ketones, ur: NEGATIVE mg/dL
Nitrite: POSITIVE — AB
Protein, ur: 100 mg/dL — AB
Specific Gravity, Urine: 1.009 (ref 1.005–1.030)
pH: 5 (ref 5.0–8.0)

## 2018-02-17 LAB — PHOSPHORUS: Phosphorus: 3.9 mg/dL (ref 2.5–4.6)

## 2018-02-17 MED ORDER — SODIUM CHLORIDE 0.9 % IV SOLN
115.0000 mg | INTRAVENOUS | Status: DC
  Administered 2018-02-17: 115 mg via INTRAVENOUS
  Filled 2018-02-17: qty 21

## 2018-02-17 MED ORDER — SODIUM CHLORIDE 0.9 % IV SOLN
INTRAVENOUS | Status: AC
  Administered 2018-02-17: 08:00:00 via INTRAVENOUS

## 2018-02-17 NOTE — Discharge Instructions (Signed)
Fabry Disease Fabry disease is a rare genetic disorder. A gene defect (mutation) alters a protein, called an enzyme, that normally breaks down a particular fat (globotriaosylceramide). The mutation keeps the enzyme from breaking down the fat. This causes excess fat to build up in the blood vessels (vasculature) of many organs of the body. Over time, this results in less blood flow and fewer nutrients getting to the body's organs. The buildup can also wear away at nerve fibers. That causes pain and other symptoms. The areas most often affected are the hands, feet, skin, eyes, and digestive system. Eventually the fatty deposits can affect your cardiovascular system, nervous system, and kidneys. This can cause a heart attack, stroke, kidney damage, or other life-threatening complications. What are the causes? Fabry disease is caused by a genetic mutation. This mutation is passed down through families (inherited). What increases the risk? You may be more likely to have Fabry disease if:  You are male.  You have a family history of the disease.  What are the signs or symptoms? Symptoms of Fabry disease include:  A rash of small, raised, reddish-purple dots on the skin (angiokeratomas).  Cloudy eyes or other eye problems.  Chronic pain, especially in the hands or feet.  Burning sensations in the hands and feet.  Gastrointestinal problems, such as bloating, diarrhea, and frequent bowel movements.  Ringing in the ears (tinnitus).  Hearing loss.  Decreased sweat production (hypohidrosis) or no sweat production (anhidrosis).  Over time, untreated Fabry disease can cause:  Severe back pain in the area of the kidneys.  Heart attack.  Stroke.  How is this diagnosed? Your health care provider may suspect Fabry disease based on your symptoms and family history. Your health care provider will also perform a physical exam. This may include tests, such as:  A blood test to measure enzyme  activity in your blood (enzyme assay).  A blood test to look for mutations in the gene that causes Fabry disease.  Urine tests to check how well your kidneys are working and to check for protein in your urine.  An ultrasound of your heart (echocardiogram) to create an image of your heart and check for heart disease.  If you or your partner has Fabry disease and you are having a child, your child can be screened for the condition at birth. If the results of that test are out of the normal range, the child may need follow-up blood and urine tests. These would help confirm the diagnosis and help determine treatment. Prenatal testing for Fabry disease also can be done. How is this treated? Medicines are used to manage Fabry disease. This may include:  Enzyme replacement therapy. This medicine performs the functions of the enzyme that is missing in Fabry disease.  Medicines to treat the pain and discomfort in your hands and feet.  You may also need treatment for complications caused by Fabry Disease. These complications may be related to your kidneys or heart. Treatments may include:  Angioplasty to repair or unblock an artery.  Heart surgery.  Dialysis to filter your kidneys.  Kidney transplant.  Follow these instructions at home:  Avoid stressful situations when possible. Stress can worsen the pain related to Fabry disease.  Stay indoors when the temperatures are very hot or very cold.  Exercise carefully, as directed by your health care provider. Physical exertion can make Fabry disease worse.  Practice good hygiene to avoid illnesses that make Fabry disease worse.  Have a good support system  in place. Contact a health care provider if:  You have worsening pain.  You have worsening vision.  You have a noticeable decrease in urination.  You have swelling of your hands, feet, or lower legs that is getting worse.  You feel increasingly weak or tired. Get help right away  if:  You have trouble breathing, chest pain, or a feeling of pressure in your chest.  You have stroke symptoms. These include: ? Numbness or weakness on one side of your body. ? Droopiness on one side of your face. ? Slurred speech. ? Confusion.  You have sharp pain in the area of your kidneys. This information is not intended to replace advice given to you by your health care provider. Make sure you discuss any questions you have with your health care provider. Document Released: 05/14/2002 Document Revised: 10/30/2015 Document Reviewed: 01/16/2014 Elsevier Interactive Patient Education  [employer].

## 2018-03-03 ENCOUNTER — Encounter (HOSPITAL_COMMUNITY): Payer: Self-pay

## 2018-03-03 ENCOUNTER — Encounter (HOSPITAL_COMMUNITY)
Admission: RE | Admit: 2018-03-03 | Discharge: 2018-03-03 | Disposition: A | Discharge status: Home / Self Care | Payer: Medicare Other | Source: Ambulatory Visit | Attending: Nephrology | Admitting: Nephrology

## 2018-03-03 DIAGNOSIS — E1101 Type 2 diabetes mellitus with hyperosmolarity with coma: Secondary | ICD-10-CM | POA: Diagnosis not present

## 2018-03-03 MED ORDER — SODIUM CHLORIDE 0.9 % IV SOLN
INTRAVENOUS | Status: AC
  Administered 2018-03-03: 08:00:00 via INTRAVENOUS

## 2018-03-03 MED ORDER — SODIUM CHLORIDE 0.9 % IV SOLN
115.0000 mg | INTRAVENOUS | Status: DC
  Administered 2018-03-03: 115 mg via INTRAVENOUS
  Filled 2018-03-03: qty 21

## 2018-03-14 ENCOUNTER — Other Ambulatory Visit: Payer: Self-pay | Admitting: Internal Medicine

## 2018-03-14 NOTE — Telephone Encounter (Signed)
Patient called requesting a refill on ALBUTEROL SULFATE HFA IN [facility] [address] Please f/u.

## 2018-03-15 ENCOUNTER — Other Ambulatory Visit: Payer: Self-pay

## 2018-03-15 NOTE — Patient Outreach (Signed)
[facility]) Care Management  03/15/2018  [name] September 04, 1960 [id number]   Medication Adherence call to [name] left a message for patient to call back patient is due on Atorvastatin 20 mg under [place].   [place] Management Direct Dial [phone]  Fax [phone] [email or web address]

## 2018-03-16 ENCOUNTER — Other Ambulatory Visit: Payer: Self-pay

## 2018-03-16 DIAGNOSIS — IMO0002 Reserved for concepts with insufficient information to code with codable children: Secondary | ICD-10-CM

## 2018-03-16 DIAGNOSIS — Z794 Long term (current) use of insulin: Principal | ICD-10-CM

## 2018-03-16 DIAGNOSIS — E1165 Type 2 diabetes mellitus with hyperglycemia: Secondary | ICD-10-CM

## 2018-03-16 DIAGNOSIS — E118 Type 2 diabetes mellitus with unspecified complications: Principal | ICD-10-CM

## 2018-03-16 MED ORDER — INSULIN LISPRO PROT & LISPRO (75-25 MIX) 100 UNIT/ML KWIKPEN: PEN_INJECTOR | SUBCUTANEOUS | 0 refills | Status: DC

## 2018-03-17 ENCOUNTER — Encounter (HOSPITAL_COMMUNITY): Payer: Self-pay

## 2018-03-17 ENCOUNTER — Ambulatory Visit (HOSPITAL_COMMUNITY)
Admission: RE | Admit: 2018-03-17 | Discharge: 2018-03-17 | Disposition: A | Discharge status: Home / Self Care | Payer: Medicare Other | Source: Ambulatory Visit | Attending: Nephrology | Admitting: Nephrology

## 2018-03-17 DIAGNOSIS — E7521 Fabry (-Anderson) disease: Secondary | ICD-10-CM | POA: Insufficient documentation

## 2018-03-17 LAB — URINALYSIS, ROUTINE W REFLEX MICROSCOPIC
Bilirubin Urine: NEGATIVE
Glucose, UA: NEGATIVE mg/dL
Ketones, ur: NEGATIVE mg/dL
Nitrite: POSITIVE — AB
Protein, ur: 100 mg/dL — AB
Specific Gravity, Urine: 1.01 (ref 1.005–1.030)
WBC, UA: >50 WBC/hpf — ABNORMAL HIGH (ref 0–5)
pH: 6 (ref 5.0–8.0)

## 2018-03-17 LAB — COMPREHENSIVE METABOLIC PANEL
ALT: 16 U/L (ref 0–44)
AST: 26 U/L (ref 15–41)
Albumin: 3.1 g/dL — ABNORMAL LOW (ref 3.5–5.0)
Alkaline Phosphatase: 68 U/L (ref 38–126)
Anion gap: 11 (ref 5–15)
BUN: 22 mg/dL — ABNORMAL HIGH (ref 6–20)
CO2: 27 mmol/L (ref 22–32)
Calcium: 8.7 mg/dL — ABNORMAL LOW (ref 8.9–10.3)
Chloride: 105 mmol/L (ref 98–111)
Creatinine, Ser: 1.6 mg/dL — ABNORMAL HIGH (ref 0.61–1.24)
GFR calc Af Amer: 54 mL/min — ABNORMAL LOW (ref >60)
GFR calc non Af Amer: 46 mL/min — ABNORMAL LOW (ref >60)
Glucose, Bld: 106 mg/dL — ABNORMAL HIGH (ref 70–99)
Potassium: 3.5 mmol/L (ref 3.5–5.1)
Sodium: 143 mmol/L (ref 135–145)
Total Bilirubin: 0.4 mg/dL (ref 0.3–1.2)
Total Protein: 7.1 g/dL (ref 6.5–8.1)

## 2018-03-17 LAB — PHOSPHORUS: Phosphorus: 4.1 mg/dL (ref 2.5–4.6)

## 2018-03-17 MED ORDER — SODIUM CHLORIDE 0.9 % IV SOLN
INTRAVENOUS | Status: AC
  Administered 2018-03-17: 08:00:00 via INTRAVENOUS

## 2018-03-17 MED ORDER — ALBUTEROL SULFATE HFA 108 (90 BASE) MCG/ACT IN AERS: 1.0000 | INHALATION_SPRAY | Freq: Four times a day (QID) | RESPIRATORY_TRACT | 0 refills | Status: DC | PRN

## 2018-03-17 MED ORDER — SODIUM CHLORIDE 0.9 % IV SOLN
115.0000 mg | INTRAVENOUS | Status: DC
  Administered 2018-03-17: 115 mg via INTRAVENOUS
  Filled 2018-03-17: qty 21

## 2018-03-28 ENCOUNTER — Encounter: Payer: Self-pay | Admitting: Cardiology

## 2018-03-31 ENCOUNTER — Encounter (HOSPITAL_COMMUNITY)
Admission: RE | Admit: 2018-03-31 | Discharge: 2018-03-31 | Disposition: A | Discharge status: Home / Self Care | Payer: Medicare Other | Source: Ambulatory Visit | Attending: Nephrology | Admitting: Nephrology

## 2018-03-31 ENCOUNTER — Encounter (HOSPITAL_COMMUNITY): Payer: Self-pay

## 2018-03-31 DIAGNOSIS — E1101 Type 2 diabetes mellitus with hyperosmolarity with coma: Secondary | ICD-10-CM | POA: Diagnosis not present

## 2018-03-31 DIAGNOSIS — E7521 Fabry (-Anderson) disease: Secondary | ICD-10-CM | POA: Insufficient documentation

## 2018-03-31 MED ORDER — SODIUM CHLORIDE 0.9 % IV SOLN
INTRAVENOUS | Status: AC
  Administered 2018-03-31: 09:00:00 via INTRAVENOUS

## 2018-03-31 MED ORDER — SODIUM CHLORIDE 0.9 % IV SOLN
115.0000 mg | INTRAVENOUS | Status: DC
  Administered 2018-03-31: 115 mg via INTRAVENOUS
  Filled 2018-03-31: qty 21

## 2018-04-05 ENCOUNTER — Other Ambulatory Visit: Payer: Self-pay | Admitting: Internal Medicine

## 2018-04-06 NOTE — Telephone Encounter (Signed)
[name] could you schedule pt an appointment and contact them

## 2018-04-14 ENCOUNTER — Encounter (HOSPITAL_COMMUNITY): Payer: Self-pay

## 2018-04-14 ENCOUNTER — Encounter (HOSPITAL_COMMUNITY)
Admission: RE | Admit: 2018-04-14 | Discharge: 2018-04-14 | Disposition: A | Discharge status: Home / Self Care | Payer: Medicare Other | Source: Ambulatory Visit | Attending: Nephrology | Admitting: Nephrology

## 2018-04-14 DIAGNOSIS — E1101 Type 2 diabetes mellitus with hyperosmolarity with coma: Secondary | ICD-10-CM | POA: Diagnosis not present

## 2018-04-14 DIAGNOSIS — E7521 Fabry (-Anderson) disease: Secondary | ICD-10-CM | POA: Diagnosis not present

## 2018-04-14 LAB — URINALYSIS, ROUTINE W REFLEX MICROSCOPIC
Bilirubin Urine: NEGATIVE
Glucose, UA: NEGATIVE mg/dL
Ketones, ur: NEGATIVE mg/dL
Nitrite: POSITIVE — AB
Protein, ur: 100 mg/dL — AB
Specific Gravity, Urine: 1.01 (ref 1.005–1.030)
WBC, UA: >50 WBC/hpf — ABNORMAL HIGH (ref 0–5)
pH: 5 (ref 5.0–8.0)

## 2018-04-14 LAB — CBC WITH DIFFERENTIAL/PLATELET
Abs Immature Granulocytes: 0.02 10*3/uL (ref 0.00–0.07)
Basophils Absolute: 0.1 10*3/uL (ref 0.0–0.1)
Basophils Relative: 1 %
Eosinophils Absolute: 0.4 10*3/uL (ref 0.0–0.5)
Eosinophils Relative: 6 %
HCT: 49 % (ref 39.0–52.0)
Hemoglobin: 15.2 g/dL (ref 13.0–17.0)
Immature Granulocytes: 0 %
Lymphocytes Relative: 37 %
Lymphs Abs: 2.6 10*3/uL (ref 0.7–4.0)
MCH: 29.5 pg (ref 26.0–34.0)
MCHC: 31 g/dL (ref 30.0–36.0)
MCV: 95.1 fL (ref 80.0–100.0)
Monocytes Absolute: 0.6 10*3/uL (ref 0.1–1.0)
Monocytes Relative: 9 %
Neutro Abs: 3.3 10*3/uL (ref 1.7–7.7)
Neutrophils Relative %: 47 %
Platelets: 165 10*3/uL (ref 150–400)
RBC: 5.15 MIL/uL (ref 4.22–5.81)
RDW: 13 % (ref 11.5–15.5)
WBC: 7 10*3/uL (ref 4.0–10.5)
nRBC: 0 % (ref 0.0–0.2)

## 2018-04-14 MED ORDER — SODIUM CHLORIDE 0.9 % IV SOLN
INTRAVENOUS | Status: AC
  Administered 2018-04-14: 10:00:00 via INTRAVENOUS

## 2018-04-14 MED ORDER — SODIUM CHLORIDE 0.9 % IV SOLN
115.0000 mg | INTRAVENOUS | Status: DC
  Administered 2018-04-14: 115 mg via INTRAVENOUS
  Filled 2018-04-14: qty 2

## 2018-04-14 NOTE — Progress Notes (Signed)
Drew multiple labs today. CBC drawn and resulted.  But CMP, Ferritin and TIBC were drawn twice each and both times they clotted.  Pt has been stuck several times to obtain labs and his IV site for his infusion.  Will attempt CMP/ferritin/TIBC at next appointment.

## 2018-04-14 NOTE — Discharge Instructions (Signed)
Fabry Disease Fabry disease is a rare genetic disorder. A gene defect (mutation) alters a protein, called an enzyme, that normally breaks down a particular fat (globotriaosylceramide). The mutation keeps the enzyme from breaking down the fat. This causes excess fat to build up in the blood vessels (vasculature) of many organs of the body. Over time, this results in less blood flow and fewer nutrients getting to the body's organs. The buildup can also wear away at nerve fibers. That causes pain and other symptoms. The areas most often affected are the hands, feet, skin, eyes, and digestive system. Eventually the fatty deposits can affect your cardiovascular system, nervous system, and kidneys. This can cause a heart attack, stroke, kidney damage, or other life-threatening complications. What are the causes? Fabry disease is caused by a genetic mutation. This mutation is passed down through families (inherited). What increases the risk? You may be more likely to have Fabry disease if:  You are male.  You have a family history of the disease.  What are the signs or symptoms? Symptoms of Fabry disease include:  A rash of small, raised, reddish-purple dots on the skin (angiokeratomas).  Cloudy eyes or other eye problems.  Chronic pain, especially in the hands or feet.  Burning sensations in the hands and feet.  Gastrointestinal problems, such as bloating, diarrhea, and frequent bowel movements.  Ringing in the ears (tinnitus).  Hearing loss.  Decreased sweat production (hypohidrosis) or no sweat production (anhidrosis).  Over time, untreated Fabry disease can cause:  Severe back pain in the area of the kidneys.  Heart attack.  Stroke.  How is this diagnosed? Your health care provider may suspect Fabry disease based on your symptoms and family history. Your health care provider will also perform a physical exam. This may include tests, such as:  A blood test to measure enzyme  activity in your blood (enzyme assay).  A blood test to look for mutations in the gene that causes Fabry disease.  Urine tests to check how well your kidneys are working and to check for protein in your urine.  An ultrasound of your heart (echocardiogram) to create an image of your heart and check for heart disease.  If you or your partner has Fabry disease and you are having a child, your child can be screened for the condition at birth. If the results of that test are out of the normal range, the child may need follow-up blood and urine tests. These would help confirm the diagnosis and help determine treatment. Prenatal testing for Fabry disease also can be done. How is this treated? Medicines are used to manage Fabry disease. This may include:  Enzyme replacement therapy. This medicine performs the functions of the enzyme that is missing in Fabry disease.  Medicines to treat the pain and discomfort in your hands and feet.  You may also need treatment for complications caused by Fabry Disease. These complications may be related to your kidneys or heart. Treatments may include:  Angioplasty to repair or unblock an artery.  Heart surgery.  Dialysis to filter your kidneys.  Kidney transplant.  Follow these instructions at home:  Avoid stressful situations when possible. Stress can worsen the pain related to Fabry disease.  Stay indoors when the temperatures are very hot or very cold.  Exercise carefully, as directed by your health care provider. Physical exertion can make Fabry disease worse.  Practice good hygiene to avoid illnesses that make Fabry disease worse.  Have a good support system  in place. Contact a health care provider if:  You have worsening pain.  You have worsening vision.  You have a noticeable decrease in urination.  You have swelling of your hands, feet, or lower legs that is getting worse.  You feel increasingly weak or tired. Get help right away  if:  You have trouble breathing, chest pain, or a feeling of pressure in your chest.  You have stroke symptoms. These include: ? Numbness or weakness on one side of your body. ? Droopiness on one side of your face. ? Slurred speech. ? Confusion.  You have sharp pain in the area of your kidneys. This information is not intended to replace advice given to you by your health care provider. Make sure you discuss any questions you have with your health care provider. Document Released: 05/14/2002 Document Revised: 10/30/2015 Document Reviewed: 01/16/2014 Elsevier Interactive Patient Education  [employer].

## 2018-04-16 ENCOUNTER — Other Ambulatory Visit: Payer: Self-pay | Admitting: Internal Medicine

## 2018-04-19 ENCOUNTER — Other Ambulatory Visit: Payer: Self-pay | Admitting: Internal Medicine

## 2018-04-20 ENCOUNTER — Other Ambulatory Visit: Payer: Self-pay | Admitting: Internal Medicine

## 2018-05-02 ENCOUNTER — Encounter (HOSPITAL_COMMUNITY)
Admission: RE | Admit: 2018-05-02 | Discharge: 2018-05-02 | Disposition: A | Discharge status: Home / Self Care | Payer: Medicare Other | Source: Ambulatory Visit | Attending: Nephrology | Admitting: Nephrology

## 2018-05-02 ENCOUNTER — Other Ambulatory Visit: Payer: Self-pay

## 2018-05-02 ENCOUNTER — Encounter (HOSPITAL_COMMUNITY): Payer: Self-pay

## 2018-05-02 DIAGNOSIS — E1101 Type 2 diabetes mellitus with hyperosmolarity with coma: Secondary | ICD-10-CM | POA: Diagnosis not present

## 2018-05-02 LAB — COMPREHENSIVE METABOLIC PANEL
ALT: 23 U/L (ref 0–44)
AST: 36 U/L (ref 15–41)
Albumin: 3.2 g/dL — ABNORMAL LOW (ref 3.5–5.0)
Alkaline Phosphatase: 71 U/L (ref 38–126)
Anion gap: 7 (ref 5–15)
BUN: 23 mg/dL — ABNORMAL HIGH (ref 6–20)
CO2: 25 mmol/L (ref 22–32)
Calcium: 8.5 mg/dL — ABNORMAL LOW (ref 8.9–10.3)
Chloride: 110 mmol/L (ref 98–111)
Creatinine, Ser: 1.62 mg/dL — ABNORMAL HIGH (ref 0.61–1.24)
GFR calc Af Amer: 54 mL/min — ABNORMAL LOW (ref >60)
GFR calc non Af Amer: 46 mL/min — ABNORMAL LOW (ref >60)
Glucose, Bld: 144 mg/dL — ABNORMAL HIGH (ref 70–99)
Potassium: 4.5 mmol/L (ref 3.5–5.1)
Sodium: 142 mmol/L (ref 135–145)
Total Bilirubin: 0.5 mg/dL (ref 0.3–1.2)
Total Protein: 7.2 g/dL (ref 6.5–8.1)

## 2018-05-02 LAB — FERRITIN: Ferritin: 122 ng/mL (ref 24–336)

## 2018-05-02 LAB — PHOSPHORUS: Phosphorus: 3.8 mg/dL (ref 2.5–4.6)

## 2018-05-02 LAB — IRON AND TIBC
Iron: 45 ug/dL (ref 45–182)
Saturation Ratios: 17 % — ABNORMAL LOW (ref 17.9–39.5)
TIBC: 271 ug/dL (ref 250–450)
UIBC: 226 ug/dL

## 2018-05-02 MED ORDER — SODIUM CHLORIDE 0.9 % IV SOLN
INTRAVENOUS | Status: AC
  Administered 2018-05-02: 08:00:00 via INTRAVENOUS

## 2018-05-02 MED ORDER — SODIUM CHLORIDE 0.9 % IV SOLN
115.0000 mg | INTRAVENOUS | Status: DC
  Administered 2018-05-02: 115 mg via INTRAVENOUS
  Filled 2018-05-02: qty 21

## 2018-05-02 MED ORDER — ALLOPURINOL 100 MG PO TABS: 300.0000 mg | ORAL_TABLET | Freq: Every day | ORAL | 1 refills | Status: DC

## 2018-05-02 NOTE — Discharge Instructions (Signed)
Fabry Disease Fabry disease is a rare genetic disorder. A gene defect (mutation) alters a protein, called an enzyme, that normally breaks down a particular fat (globotriaosylceramide). The mutation keeps the enzyme from breaking down the fat. This causes excess fat to build up in the blood vessels (vasculature) of many organs of the body. Over time, this results in less blood flow and fewer nutrients getting to the body's organs. The buildup can also wear away at nerve fibers. That causes pain and other symptoms. The areas most often affected are the hands, feet, skin, eyes, and digestive system. Eventually the fatty deposits can affect your cardiovascular system, nervous system, and kidneys. This can cause a heart attack, stroke, kidney damage, or other life-threatening complications. What are the causes? Fabry disease is caused by a genetic mutation. This mutation is passed down through families (inherited). What increases the risk? You may be more likely to have Fabry disease if:  You are male.  You have a family history of the disease.  What are the signs or symptoms? Symptoms of Fabry disease include:  A rash of small, raised, reddish-purple dots on the skin (angiokeratomas).  Cloudy eyes or other eye problems.  Chronic pain, especially in the hands or feet.  Burning sensations in the hands and feet.  Gastrointestinal problems, such as bloating, diarrhea, and frequent bowel movements.  Ringing in the ears (tinnitus).  Hearing loss.  Decreased sweat production (hypohidrosis) or no sweat production (anhidrosis).  Over time, untreated Fabry disease can cause:  Severe back pain in the area of the kidneys.  Heart attack.  Stroke.  How is this diagnosed? Your health care provider may suspect Fabry disease based on your symptoms and family history. Your health care provider will also perform a physical exam. This may include tests, such as:  A blood test to measure enzyme  activity in your blood (enzyme assay).  A blood test to look for mutations in the gene that causes Fabry disease.  Urine tests to check how well your kidneys are working and to check for protein in your urine.  An ultrasound of your heart (echocardiogram) to create an image of your heart and check for heart disease.  If you or your partner has Fabry disease and you are having a child, your child can be screened for the condition at birth. If the results of that test are out of the normal range, the child may need follow-up blood and urine tests. These would help confirm the diagnosis and help determine treatment. Prenatal testing for Fabry disease also can be done. How is this treated? Medicines are used to manage Fabry disease. This may include:  Enzyme replacement therapy. This medicine performs the functions of the enzyme that is missing in Fabry disease.  Medicines to treat the pain and discomfort in your hands and feet.  You may also need treatment for complications caused by Fabry Disease. These complications may be related to your kidneys or heart. Treatments may include:  Angioplasty to repair or unblock an artery.  Heart surgery.  Dialysis to filter your kidneys.  Kidney transplant.  Follow these instructions at home:  Avoid stressful situations when possible. Stress can worsen the pain related to Fabry disease.  Stay indoors when the temperatures are very hot or very cold.  Exercise carefully, as directed by your health care provider. Physical exertion can make Fabry disease worse.  Practice good hygiene to avoid illnesses that make Fabry disease worse.  Have a good support system  in place. Contact a health care provider if:  You have worsening pain.  You have worsening vision.  You have a noticeable decrease in urination.  You have swelling of your hands, feet, or lower legs that is getting worse.  You feel increasingly weak or tired. Get help right away  if:  You have trouble breathing, chest pain, or a feeling of pressure in your chest.  You have stroke symptoms. These include: ? Numbness or weakness on one side of your body. ? Droopiness on one side of your face. ? Slurred speech. ? Confusion.  You have sharp pain in the area of your kidneys. This information is not intended to replace advice given to you by your health care provider. Make sure you discuss any questions you have with your health care provider. Document Released: 05/14/2002 Document Revised: 10/30/2015 Document Reviewed: 01/16/2014 Elsevier Interactive Patient Education  [employer].

## 2018-05-15 LAB — HM DIABETES EYE EXAM

## 2018-05-16 ENCOUNTER — Other Ambulatory Visit: Payer: Self-pay | Admitting: Internal Medicine

## 2018-05-17 ENCOUNTER — Other Ambulatory Visit: Payer: Self-pay | Admitting: Internal Medicine

## 2018-05-19 ENCOUNTER — Encounter (HOSPITAL_COMMUNITY): Payer: Self-pay

## 2018-05-19 ENCOUNTER — Encounter (HOSPITAL_COMMUNITY)
Admission: RE | Admit: 2018-05-19 | Discharge: 2018-05-19 | Disposition: A | Discharge status: Home / Self Care | Payer: Medicare Other | Source: Ambulatory Visit | Attending: Nephrology | Admitting: Nephrology

## 2018-05-19 DIAGNOSIS — E7521 Fabry (-Anderson) disease: Secondary | ICD-10-CM | POA: Diagnosis not present

## 2018-05-19 DIAGNOSIS — E1101 Type 2 diabetes mellitus with hyperosmolarity with coma: Secondary | ICD-10-CM | POA: Diagnosis not present

## 2018-05-19 LAB — URINALYSIS, ROUTINE W REFLEX MICROSCOPIC
Bilirubin Urine: NEGATIVE
Glucose, UA: NEGATIVE mg/dL
Hgb urine dipstick: NEGATIVE
Ketones, ur: NEGATIVE mg/dL
Nitrite: POSITIVE — AB
Protein, ur: 100 mg/dL — AB
Specific Gravity, Urine: 1.008 (ref 1.005–1.030)
WBC, UA: >50 WBC/hpf — ABNORMAL HIGH (ref 0–5)
pH: 6 (ref 5.0–8.0)

## 2018-05-19 MED ORDER — SODIUM CHLORIDE 0.9 % IV SOLN
INTRAVENOUS | Status: AC
  Administered 2018-05-19: 09:00:00 via INTRAVENOUS

## 2018-05-19 MED ORDER — SODIUM CHLORIDE 0.9 % IV SOLN
115.0000 mg | INTRAVENOUS | Status: AC
  Administered 2018-05-19: 115 mg via INTRAVENOUS
  Filled 2018-05-19: qty 21

## 2018-05-29 ENCOUNTER — Other Ambulatory Visit: Payer: Self-pay | Admitting: Internal Medicine

## 2018-05-29 ENCOUNTER — Ambulatory Visit: Payer: Medicare Other | Attending: Critical Care Medicine | Admitting: Critical Care Medicine

## 2018-05-29 ENCOUNTER — Encounter: Payer: Self-pay | Admitting: Critical Care Medicine

## 2018-05-29 VITALS — BP 144/79 | HR 108 | Temp 98.4°F | Resp 16 | Ht 70.0 in | Wt 291.8 lb

## 2018-05-29 DIAGNOSIS — E785 Hyperlipidemia, unspecified: Secondary | ICD-10-CM | POA: Diagnosis not present

## 2018-05-29 DIAGNOSIS — I421 Obstructive hypertrophic cardiomyopathy: Secondary | ICD-10-CM | POA: Insufficient documentation

## 2018-05-29 DIAGNOSIS — E669 Obesity, unspecified: Secondary | ICD-10-CM

## 2018-05-29 DIAGNOSIS — Z794 Long term (current) use of insulin: Secondary | ICD-10-CM | POA: Diagnosis not present

## 2018-05-29 DIAGNOSIS — E118 Type 2 diabetes mellitus with unspecified complications: Principal | ICD-10-CM

## 2018-05-29 DIAGNOSIS — I422 Other hypertrophic cardiomyopathy: Secondary | ICD-10-CM | POA: Insufficient documentation

## 2018-05-29 DIAGNOSIS — I5032 Chronic diastolic (congestive) heart failure: Secondary | ICD-10-CM | POA: Insufficient documentation

## 2018-05-29 DIAGNOSIS — E1165 Type 2 diabetes mellitus with hyperglycemia: Secondary | ICD-10-CM

## 2018-05-29 DIAGNOSIS — Z6841 Body Mass Index (BMI) 40.0 and over, adult: Secondary | ICD-10-CM | POA: Insufficient documentation

## 2018-05-29 DIAGNOSIS — N183 Chronic kidney disease, stage 3 unspecified: Secondary | ICD-10-CM

## 2018-05-29 DIAGNOSIS — Z23 Encounter for immunization: Secondary | ICD-10-CM | POA: Insufficient documentation

## 2018-05-29 DIAGNOSIS — IMO0002 Reserved for concepts with insufficient information to code with codable children: Secondary | ICD-10-CM

## 2018-05-29 DIAGNOSIS — I130 Hypertensive heart and chronic kidney disease with heart failure and stage 1 through stage 4 chronic kidney disease, or unspecified chronic kidney disease: Secondary | ICD-10-CM | POA: Insufficient documentation

## 2018-05-29 DIAGNOSIS — I10 Essential (primary) hypertension: Secondary | ICD-10-CM | POA: Diagnosis not present

## 2018-05-29 DIAGNOSIS — Z888 Allergy status to other drugs, medicaments and biological substances status: Secondary | ICD-10-CM | POA: Insufficient documentation

## 2018-05-29 DIAGNOSIS — I890 Lymphedema, not elsewhere classified: Secondary | ICD-10-CM | POA: Insufficient documentation

## 2018-05-29 DIAGNOSIS — E1169 Type 2 diabetes mellitus with other specified complication: Secondary | ICD-10-CM

## 2018-05-29 DIAGNOSIS — Z7982 Long term (current) use of aspirin: Secondary | ICD-10-CM | POA: Insufficient documentation

## 2018-05-29 DIAGNOSIS — E1122 Type 2 diabetes mellitus with diabetic chronic kidney disease: Secondary | ICD-10-CM | POA: Insufficient documentation

## 2018-05-29 LAB — POCT GLYCOSYLATED HEMOGLOBIN (HGB A1C): HbA1c, POC (controlled diabetic range): 7.8 % — AB (ref 0.0–7.0)

## 2018-05-29 LAB — GLUCOSE, POCT (MANUAL RESULT ENTRY): POC Glucose: 187 mg/dl — AB (ref 70–99)

## 2018-05-29 MED ORDER — AMLODIPINE BESYLATE 5 MG PO TABS: 5.0000 mg | ORAL_TABLET | Freq: Every day | ORAL | 6 refills | Status: DC

## 2018-05-29 MED ORDER — ALBUTEROL SULFATE HFA 108 (90 BASE) MCG/ACT IN AERS: 1.0000 | INHALATION_SPRAY | Freq: Four times a day (QID) | RESPIRATORY_TRACT | 1 refills | Status: DC | PRN

## 2018-05-29 MED ORDER — FUROSEMIDE 20 MG PO TABS: 40.0000 mg | ORAL_TABLET | Freq: Every day | ORAL | 6 refills | Status: DC

## 2018-05-29 MED ORDER — INSULIN PEN NEEDLE 31G X 8 MM MISC: 1.0000 | Freq: Two times a day (BID) | 8 refills | Status: DC

## 2018-05-29 MED ORDER — ATORVASTATIN CALCIUM 20 MG PO TABS: 20.0000 mg | ORAL_TABLET | Freq: Every day | ORAL | 5 refills | Status: DC

## 2018-05-29 MED ORDER — INSULIN LISPRO PROT & LISPRO (75-25 MIX) 100 UNIT/ML KWIKPEN: PEN_INJECTOR | SUBCUTANEOUS | 0 refills | Status: DC

## 2018-05-29 MED ORDER — LOSARTAN POTASSIUM-HCTZ 50-12.5 MG PO TABS: 1.0000 | ORAL_TABLET | Freq: Every day | ORAL | 5 refills | Status: DC

## 2018-05-29 MED ORDER — ALLOPURINOL 100 MG PO TABS: 300.0000 mg | ORAL_TABLET | Freq: Every day | ORAL | 1 refills | Status: DC

## 2018-05-29 NOTE — Assessment & Plan Note (Signed)
Lymphedema with diastolic heart failure Plan The patient will continue current dose of Lasix as prescribed by nephrology

## 2018-05-29 NOTE — Progress Notes (Signed)
Subjective:    [name], male    DOB: 1960/07/29, 57 y.o.   MRN: [id number]  57 y.o.M here for DM and HTN as a work in Needed insulin filled  The patient went to the pharmacy today to obtain his insulin and was told he had to make an office visit for this  The patient states his CBGs have been running anywhere from 80-100 however here in the office his CBG was higher than this and his hemoglobin A1c was 7.8    Diabetes  He presents for his follow-up diabetic visit. He has type 2 diabetes mellitus. The initial diagnosis of diabetes was made 9 years ago. His disease course has been stable. There are no hypoglycemic associated symptoms. Pertinent negatives for hypoglycemia include no dizziness or sweats. (Some AM CBGs 69) Pertinent negatives for diabetes include no blurred vision, no chest pain, no foot paresthesias, no foot ulcerations, no polydipsia, no polyphagia, no polyuria and no weakness. There are no hypoglycemic complications. Symptoms are stable. Risk factors for coronary artery disease include hypertension and diabetes mellitus.  Hypertension  This is a chronic problem. The current episode started more than 1 month ago. The problem is unchanged. The problem is uncontrolled. Associated symptoms include peripheral edema. Pertinent negatives include no anxiety, blurred vision, chest pain, malaise/fatigue, orthopnea, palpitations, PND, shortness of breath or sweats.   Past Medical History:  Diagnosis Date  . Bell's palsy   . Bifascicular block   . Chronic bronchitis ([place])    "seasonal; get it q yr"  . Chronic diastolic CHF (congestive heart failure) ([place]) 2010  . Chronic kidney disease (CKD), stage II (mild)    stage II to III/notes 07/23/2014  . Diastolic heart failure secondary to hypertrophic cardiomyopathy ([place]) CARDIOLOGIST-- DR Daneen Schick  . Dyspnea    increased exertion   . Edema 07/2013  . 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 Gout, arthritis 2014   bil feet. right worse  . History of cellulitis of skin with lymphangitis LEFT LEG  . HOCM (hypertrophic obstructive cardiomyopathy) ([place])    a. Echo 11/16: Severe LVH, EF 55-60%, abnormal GLS consistent with HOCM, no SAM, mild LAE  . Hypertension 20 years  . Lymphedema of lower extremity LEFT  >  RIGHT   "using ankle high socks at home; pump doesn't work for me" (07/23/2014)  . Pacemaker    02/12/11  . Pneumonia 08/2011  . Seasonal asthma NO INHALERS   30 years  . Second degree Mobitz II AV block    with syncope, s/p PPM  . Short of breath on exertion   . Type II diabetes mellitus ([place]) 2012   INSULIN DEPENDENT     Family History  Problem Relation Age of Onset  . Stroke Mother   . Colon cancer Neg Hx      Social History   Socioeconomic History  . Marital status: Divorced    Spouse name: Not on file  . Number of children: Not on file  . Years of education: Not on file  . Highest education level: Not on file  Occupational History  . Not on file  Social Needs  . Financial resource strain: Not on file  . Food insecurity:    Worry: Not on file    Inability: Not on file  . Transportation needs:    Medical: Not on file    Non-medical: Not on file  Tobacco Use  .  Smoking status: Never Smoker  . Smokeless tobacco: Never Used  Substance and Sexual Activity  . Alcohol use: Yes    Comment: 07/23/2014 "might have a few drinks on holidays or at cookouts"  . Drug use: No  . Sexual activity: Yes  Lifestyle  . Physical activity:    Days per week: Not on file    Minutes per session: Not on file  . Stress: Not on file  Relationships  . Social connections:    Talks on phone: Not on file    Gets together: Not on file    Attends religious service: Not on file    Active member of club or organization: Not on file    Attends meetings of clubs or organizations: Not on file    Relationship status: Not on file  . Intimate partner violence:     Fear of current or ex partner: Not on file    Emotionally abused: Not on file    Physically abused: Not on file    Forced sexual activity: Not on file  Other Topics Concern  . Not on file  Social History Narrative   Coaches [employer]      Allergies  Allergen Reactions  . Shellfish Allergy Hives and Swelling  . Lisinopril      Outpatient Medications Prior to Visit  Medication Sig Dispense Refill  . ACCU-CHEK AVIVA PLUS test strip TEST 3 TIMES DAILY. ICD 10 E11.8 100 each 12  . ACCU-CHEK FASTCLIX LANCETS MISC Use to test blood sugar 3 to 4 times daily. 102 each 5  . acetaminophen (TYLENOL) 500 MG tablet Take 500 mg by mouth every 14 (fourteen) days. Only taking 1 hour before infusion.  AS premed for Fabrazyme    . Agalsidase beta (FABRAZYME IV) Inject 115 mg into the vein every 14 (fourteen) days.     [name] Kitchen aspirin 81 MG tablet Take 81 mg by mouth daily.     . fexofenadine (ALLEGRA) 180 MG tablet Take 180 mg by mouth every 14 (fourteen) days.     . fluticasone (FLONASE) 50 MCG/ACT nasal spray Place 1 spray into both nostrils daily. 16 g 1  . OLIVE LEAF PO Take 1 tablet by mouth 2 (two) times daily.     [name] Kitchen albuterol (PROAIR HFA) 108 (90 Base) MCG/ACT inhaler Inhale 1 puff into the lungs every 6 (six) hours as needed for wheezing or shortness of breath. MUST MAKE APPT FOR FURTHER REFILLS 8.5 Inhaler 0  . allopurinol (ZYLOPRIM) 100 MG tablet Take 3 tablets (300 mg total) by mouth daily. 90 tablet 1  . amLODipine (NORVASC) 5 MG tablet Take 5 mg by mouth daily.    [name] Kitchen atorvastatin (LIPITOR) 20 MG tablet Take 1 tablet (20 mg total) by mouth daily. 30 tablet 5  . furosemide (LASIX) 20 MG tablet Take 1 tablet (20 mg total) by mouth daily. Lower extremiity edema (Patient taking differently: Take 40 mg by mouth daily. Lower extremiity edema ) 30 tablet 3  . Insulin Lispro Prot & Lispro (HUMALOG MIX 75/25 KWIKPEN) (75-25) 100 UNIT/ML Kwikpen INJECT 20 UNITS INTO THE SKIN 2 (TWO) TIMES DAILY. MUST  MAKE APPT FOR FURTHER REFILLS 15 mL 0  . Insulin Pen Needle (PX SHORTLENGTH PEN NEEDLES) 31G X 8 MM MISC 1 each by Does not apply route 2 (two) times daily before a meal. 100 each 8  . losartan-hydrochlorothiazide (HYZAAR) 50-12.5 MG tablet Take 1 tablet by mouth daily. 30 tablet 5  . hydrocortisone valerate cream (WESTCORT)  0.2 % Apply to affected area daily PRN 45 g 0   No facility-administered medications prior to visit.       Review of Systems  Constitutional: Negative for malaise/fatigue.  Eyes: Negative for blurred vision.  Respiratory: Negative for shortness of breath.   Cardiovascular: Negative for chest pain, palpitations, orthopnea and PND.  Endocrine: Negative for polydipsia, polyphagia and polyuria.  Neurological: Negative for dizziness and weakness.       Objective:   Physical Exam Vitals:   05/29/18 1454  BP: (!) 144/79  Pulse: (!) 108  Resp: 16  Temp: 98.4 F (36.9 C)  TempSrc: Oral  SpO2: 98%  Weight: 291 lb 12.8 oz (132.4 kg)  Height: 5\' 10"  (1.778 m)    Gen: Pleasant, well-nourished, in no distress,  normal affect  ENT: No lesions,  mouth clear,  oropharynx clear, no postnasal drip  Neck: No JVD, no TMG, no carotid bruits  Lungs: No use of accessory muscles, no dullness to percussion, clear without rales or rhonchi  Cardiovascular: RRR, heart sounds normal, no murmur or gallops, 4+ peripheral edema  Abdomen: soft and NT, no HSM,  BS normal  Musculoskeletal: No deformities, no cyanosis or clubbing  Neuro: alert, non focal  Skin: Warm, no lesions or rashes  Feet: normal sensation, no ulceration  No results found.  HgA1c 7.8    CBG 187   BMP Latest Ref Rng & Units 05/02/2018 03/17/2018 02/17/2018  Glucose 70 - 99 mg/dL 144(H) 106(H) 118(H)  BUN 6 - 20 mg/dL 23(H) 22(H) 24(H)  Creatinine 0.61 - 1.24 mg/dL 1.62(H) 1.60(H) 1.69(H)  BUN/Creat Ratio 9 - 20 - - -  Sodium 135 - 145 mmol/L 142 143 148(H)  Potassium 3.5 - 5.1 mmol/L 4.5 3.5 4.5    Chloride 98 - 111 mmol/L 110 105 112(H)  CO2 22 - 32 mmol/L 25 27 27   Calcium 8.9 - 10.3 mg/dL 8.5(L) 8.7(L) 9.3      Assessment & Plan:  I personally reviewed all images and lab data in the [facility] system as well as any outside material available during this office visit and agree with the  radiology impressions.   Diabetes mellitus type 2 in obese ([place]) Diabetes type 2 with previous complications including kidney insufficiency and previous foot ulcer which is now healed  Note hemoglobin A1c today was 7.1 and the patient was out of his insulin supply  Plan Insulin dosing was renewed including Humalog 75/25 20 units twice daily  A flu vaccine was given at this visit  Lymphedema Lymphedema with diastolic heart failure Plan The patient will continue current dose of Lasix as prescribed by nephrology  CKD (chronic kidney disease), stage III Chronic kidney disease stage III The patient will continue to follow with nephrology   Jeneen Rinks was seen today for diabetes and hypertension.  Diagnoses and all orders for this visit:  Controlled type 2 diabetes mellitus with complication, with long-term current use of insulin (HCC) -     POCT glucose (manual entry) -     POCT glycosylated hemoglobin (Hb A1C) -     Microalbumin / creatinine urine ratio  Essential hypertension -     losartan-hydrochlorothiazide (HYZAAR) 50-12.5 MG tablet; Take 1 tablet by mouth daily. -     furosemide (LASIX) 20 MG tablet; Take 2 tablets (40 mg total) by mouth daily. Lower extremiity edema  Uncontrolled type 2 diabetes mellitus with complication, with long-term current use of insulin (HCC) -     Insulin Pen Needle (PX  [place] PEN NEEDLES) 31G X 8 MM MISC; 1 each by Does not apply route 2 (two) times daily before a meal. -     Insulin Lispro Prot & Lispro (HUMALOG MIX 75/25 KWIKPEN) (75-25) 100 UNIT/ML Kwikpen; INJECT 20 UNITS INTO THE SKIN 2 (TWO) TIMES DAILY.  Hyperlipidemia, unspecified hyperlipidemia  type -     atorvastatin (LIPITOR) 20 MG tablet; Take 1 tablet (20 mg total) by mouth daily.  Need for immunization against influenza -     Flu Vaccine QUAD 36+ mos IM  Diabetes mellitus type 2 in obese (HCC)  Lymphedema  CKD (chronic kidney disease), stage III ([place])  Other orders -     amLODipine (NORVASC) 5 MG tablet; Take 1 tablet (5 mg total) by mouth daily. -     allopurinol (ZYLOPRIM) 100 MG tablet; Take 3 tablets (300 mg total) by mouth daily. -     albuterol (PROAIR HFA) 108 (90 Base) MCG/ACT inhaler; Inhale 1 puff into the lungs every 6 (six) hours as needed for wheezing or shortness of breath.

## 2018-05-29 NOTE — Patient Instructions (Signed)
Refills on all your medications were sent to the local pharmacy  A flu vaccine will be given  Return to see Dr. Wynetta Emery in 2 months  Please review diabetic diet below   Diabetes Mellitus and Nutrition, Adult When you have diabetes (diabetes mellitus), it is very important to have healthy eating habits because your blood sugar (glucose) levels are greatly affected by what you eat and drink. Eating healthy foods in the appropriate amounts, at about the same times every day, can help you:  Control your blood glucose.  Lower your risk of heart disease.  Improve your blood pressure.  Reach or maintain a healthy weight. Every person with diabetes is different, and each person has different needs for a meal plan. Your health care provider may recommend that you work with a diet and nutrition specialist (dietitian) to make a meal plan that is best for you. Your meal plan may vary depending on factors such as:  The calories you need.  The medicines you take.  Your weight.  Your blood glucose, blood pressure, and cholesterol levels.  Your activity level.  Other health conditions you have, such as heart or kidney disease. How do carbohydrates affect me? Carbohydrates, also called carbs, affect your blood glucose level more than any other type of food. Eating carbs naturally raises the amount of glucose in your blood. Carb counting is a method for keeping track of how many carbs you eat. Counting carbs is important to keep your blood glucose at a healthy level, especially if you use insulin or take certain oral diabetes medicines. It is important to know how many carbs you can safely have in each meal. This is different for every person. Your dietitian can help you calculate how many carbs you should have at each meal and for each snack. Foods that contain carbs include:  Bread, cereal, rice, pasta, and crackers.  Potatoes and corn.  Peas, beans, and lentils.  Milk and yogurt.  Fruit  and juice.  Desserts, such as cakes, cookies, ice cream, and candy. How does alcohol affect me? Alcohol can cause a sudden decrease in blood glucose (hypoglycemia), especially if you use insulin or take certain oral diabetes medicines. Hypoglycemia can be a life-threatening condition. Symptoms of hypoglycemia (sleepiness, dizziness, and confusion) are similar to symptoms of having too much alcohol. If your health care provider says that alcohol is safe for you, follow these guidelines:  Limit alcohol intake to no more than 1 drink per day for nonpregnant women and 2 drinks per day for men. One drink equals 12 oz of beer, 5 oz of wine, or 1 oz of hard liquor.  Do not drink on an empty stomach.  Keep yourself hydrated with water, diet soda, or unsweetened iced tea.  Keep in mind that regular soda, juice, and other mixers may contain a lot of sugar and must be counted as carbs. What are tips for following this plan?  Reading food labels  Start by checking the serving size on the "Nutrition Facts" label of packaged foods and drinks. The amount of calories, carbs, fats, and other nutrients listed on the label is based on one serving of the item. Many items contain more than one serving per package.  Check the total grams (g) of carbs in one serving. You can calculate the number of servings of carbs in one serving by dividing the total carbs by 15. For example, if a food has 30 g of total carbs, it would be equal to  2 servings of carbs.  Check the number of grams (g) of saturated and trans fats in one serving. Choose foods that have low or no amount of these fats.  Check the number of milligrams (mg) of salt (sodium) in one serving. Most people should limit total sodium intake to less than 2,300 mg per day.  Always check the nutrition information of foods labeled as "low-fat" or "nonfat". These foods may be higher in added sugar or refined carbs and should be avoided.  Talk to your dietitian  to identify your daily goals for nutrients listed on the label. Shopping  Avoid buying canned, premade, or processed foods. These foods tend to be high in fat, sodium, and added sugar.  Shop around the outside edge of the grocery store. This includes fresh fruits and vegetables, bulk grains, fresh meats, and fresh dairy. Cooking  Use low-heat cooking methods, such as baking, instead of high-heat cooking methods like deep frying.  Cook using healthy oils, such as olive, canola, or sunflower oil.  Avoid cooking with butter, cream, or high-fat meats. Meal planning  Eat meals and snacks regularly, preferably at the same times every day. Avoid going long periods of time without eating.  Eat foods high in fiber, such as fresh fruits, vegetables, beans, and whole grains. Talk to your dietitian about how many servings of carbs you can eat at each meal.  Eat 4-6 ounces (oz) of lean protein each day, such as lean meat, chicken, fish, eggs, or tofu. One oz of lean protein is equal to: ? 1 oz of meat, chicken, or fish. ? 1 egg. ?  cup of tofu.  Eat some foods each day that contain healthy fats, such as avocado, nuts, seeds, and fish. Lifestyle  Check your blood glucose regularly.  Exercise regularly as told by your health care provider. This may include: ? 150 minutes of moderate-intensity or vigorous-intensity exercise each week. This could be brisk walking, biking, or water aerobics. ? Stretching and doing strength exercises, such as yoga or weightlifting, at least 2 times a week.  Take medicines as told by your health care provider.  Do not use any products that contain nicotine or tobacco, such as cigarettes and e-cigarettes. If you need help quitting, ask your health care provider.  Work with a Social worker or diabetes educator to identify strategies to manage stress and any emotional and social challenges. Questions to ask a health care provider  Do I need to meet with a diabetes  educator?  Do I need to meet with a dietitian?  What number can I call if I have questions?  When are the best times to check my blood glucose? Where to find more information:  American Diabetes Association: diabetes.org  Academy of Nutrition and Dietetics: www.eatright.[employer] of Diabetes and Digestive and Kidney Diseases (NIH): [employer] Summary  A healthy meal plan will help you control your blood glucose and maintain a healthy lifestyle.  Working with a diet and nutrition specialist (dietitian) can help you make a meal plan that is best for you.  Keep in mind that carbohydrates (carbs) and alcohol have immediate effects on your blood glucose levels. It is important to count carbs and to use alcohol carefully. This information is not intended to replace advice given to you by your health care provider. Make sure you discuss any questions you have with your health care provider. Document Released: 02/18/2005 Document Revised: 12/22/2016 Document Reviewed: 06/28/2016 Elsevier Interactive Patient Education  2019 [employer].

## 2018-05-29 NOTE — Assessment & Plan Note (Signed)
Chronic kidney disease stage III The patient will continue to follow with nephrology

## 2018-05-29 NOTE — Assessment & Plan Note (Addendum)
Diabetes type 2 with previous complications including kidney insufficiency and previous foot ulcer which is now healed  Note hemoglobin A1c today was 7.1 and the patient was out of his insulin supply  Plan Insulin dosing was renewed including Humalog 75/25 20 units twice daily  A flu vaccine was given at this visit

## 2018-05-30 LAB — MICROALBUMIN / CREATININE URINE RATIO
Creatinine, Urine: 46 mg/dL
Microalb/Creat Ratio: 710 mg/g creat — ABNORMAL HIGH (ref 0.0–30.0)
Microalbumin, Urine: 326.6 ug/mL

## 2018-06-02 ENCOUNTER — Telehealth: Payer: Self-pay | Admitting: *Deleted

## 2018-06-02 ENCOUNTER — Encounter (HOSPITAL_COMMUNITY): Payer: Self-pay

## 2018-06-02 ENCOUNTER — Encounter (HOSPITAL_COMMUNITY)
Admission: RE | Admit: 2018-06-02 | Discharge: 2018-06-02 | Disposition: A | Discharge status: Home / Self Care | Payer: Medicare Other | Source: Ambulatory Visit | Attending: Nephrology | Admitting: Nephrology

## 2018-06-02 DIAGNOSIS — E1101 Type 2 diabetes mellitus with hyperosmolarity with coma: Secondary | ICD-10-CM | POA: Diagnosis not present

## 2018-06-02 LAB — COMPREHENSIVE METABOLIC PANEL
ALT: 24 U/L (ref 0–44)
AST: 28 U/L (ref 15–41)
Albumin: 3.3 g/dL — ABNORMAL LOW (ref 3.5–5.0)
Alkaline Phosphatase: 77 U/L (ref 38–126)
Anion gap: 9 (ref 5–15)
BUN: 29 mg/dL — ABNORMAL HIGH (ref 6–20)
CO2: 26 mmol/L (ref 22–32)
Calcium: 8.8 mg/dL — ABNORMAL LOW (ref 8.9–10.3)
Chloride: 106 mmol/L (ref 98–111)
Creatinine, Ser: 1.64 mg/dL — ABNORMAL HIGH (ref 0.61–1.24)
GFR calc Af Amer: 53 mL/min — ABNORMAL LOW (ref >60)
GFR calc non Af Amer: 46 mL/min — ABNORMAL LOW (ref >60)
Glucose, Bld: 215 mg/dL — ABNORMAL HIGH (ref 70–99)
Potassium: 3.6 mmol/L (ref 3.5–5.1)
Sodium: 141 mmol/L (ref 135–145)
Total Bilirubin: 0.6 mg/dL (ref 0.3–1.2)
Total Protein: 7.5 g/dL (ref 6.5–8.1)

## 2018-06-02 LAB — PHOSPHORUS: Phosphorus: 4.4 mg/dL (ref 2.5–4.6)

## 2018-06-02 MED ORDER — SODIUM CHLORIDE 0.9 % IV SOLN
115.0000 mg | INTRAVENOUS | Status: DC
  Administered 2018-06-02: 115 mg via INTRAVENOUS
  Filled 2018-06-02: qty 21

## 2018-06-02 MED ORDER — SODIUM CHLORIDE 0.9 % IV SOLN
INTRAVENOUS | Status: DC
  Administered 2018-06-02: 08:00:00 via INTRAVENOUS

## 2018-06-02 NOTE — Progress Notes (Signed)
Lab results/CMP and Phophorus faxed to Dr Arty Baumgartner.

## 2018-06-02 NOTE — Telephone Encounter (Signed)
Medical Assistant left message on patient's home and cell voicemail. Voicemail states to give a call back to [place] with [facility] at [phone]. Patient is aware of needing to adhere to DM regimen to address elevated kidney function level.

## 2018-06-02 NOTE — Telephone Encounter (Signed)
-----   Message from Elsie Stain, MD sent at 06/02/2018  8:33 AM EST ----- [place]: microalbumin levels elevated and noted.  Please let patient know result and he needs to stay on his insulin as prescribed

## 2018-06-16 ENCOUNTER — Encounter (HOSPITAL_COMMUNITY)
Admission: RE | Admit: 2018-06-16 | Discharge: 2018-06-16 | Disposition: A | Discharge status: Home / Self Care | Payer: Medicare Other | Source: Ambulatory Visit | Attending: Nephrology | Admitting: Nephrology

## 2018-06-16 ENCOUNTER — Encounter (HOSPITAL_COMMUNITY): Payer: Self-pay

## 2018-06-16 DIAGNOSIS — E1101 Type 2 diabetes mellitus with hyperosmolarity with coma: Secondary | ICD-10-CM | POA: Insufficient documentation

## 2018-06-16 DIAGNOSIS — E7521 Fabry (-Anderson) disease: Secondary | ICD-10-CM | POA: Insufficient documentation

## 2018-06-16 LAB — URINALYSIS, ROUTINE W REFLEX MICROSCOPIC
Bacteria, UA: NONE SEEN
Bilirubin Urine: NEGATIVE
Glucose, UA: NEGATIVE mg/dL
Hgb urine dipstick: NEGATIVE
Ketones, ur: NEGATIVE mg/dL
Nitrite: POSITIVE — AB
Protein, ur: 100 mg/dL — AB
Specific Gravity, Urine: 1.009 (ref 1.005–1.030)
WBC, UA: >50 WBC/hpf — ABNORMAL HIGH (ref 0–5)
pH: 6 (ref 5.0–8.0)

## 2018-06-16 MED ORDER — SODIUM CHLORIDE 0.9 % IV SOLN
115.0000 mg | INTRAVENOUS | Status: DC
  Administered 2018-06-16: 115 mg via INTRAVENOUS
  Filled 2018-06-16: qty 2

## 2018-06-16 MED ORDER — SODIUM CHLORIDE 0.9 % IV SOLN
INTRAVENOUS | Status: DC
  Administered 2018-06-16: 11:00:00 via INTRAVENOUS

## 2018-06-30 ENCOUNTER — Encounter (HOSPITAL_COMMUNITY): Payer: Self-pay

## 2018-06-30 ENCOUNTER — Encounter (HOSPITAL_COMMUNITY)
Admission: RE | Admit: 2018-06-30 | Discharge: 2018-06-30 | Disposition: A | Discharge status: Home / Self Care | Payer: Medicare Other | Source: Ambulatory Visit | Attending: Nephrology | Admitting: Nephrology

## 2018-06-30 DIAGNOSIS — E1101 Type 2 diabetes mellitus with hyperosmolarity with coma: Secondary | ICD-10-CM | POA: Diagnosis not present

## 2018-06-30 LAB — COMPREHENSIVE METABOLIC PANEL
ALT: 22 U/L (ref 0–44)
AST: 30 U/L (ref 15–41)
Albumin: 3.6 g/dL (ref 3.5–5.0)
Alkaline Phosphatase: 80 U/L (ref 38–126)
Anion gap: 10 (ref 5–15)
BUN: 30 mg/dL — ABNORMAL HIGH (ref 6–20)
CO2: 25 mmol/L (ref 22–32)
Calcium: 9.1 mg/dL (ref 8.9–10.3)
Chloride: 107 mmol/L (ref 98–111)
Creatinine, Ser: 1.8 mg/dL — ABNORMAL HIGH (ref 0.61–1.24)
GFR calc Af Amer: 47 mL/min — ABNORMAL LOW (ref >60)
GFR calc non Af Amer: 41 mL/min — ABNORMAL LOW (ref >60)
Glucose, Bld: 133 mg/dL — ABNORMAL HIGH (ref 70–99)
Potassium: 3.8 mmol/L (ref 3.5–5.1)
Sodium: 142 mmol/L (ref 135–145)
Total Bilirubin: 0.4 mg/dL (ref 0.3–1.2)
Total Protein: 7.9 g/dL (ref 6.5–8.1)

## 2018-06-30 LAB — PHOSPHORUS: Phosphorus: 3.8 mg/dL (ref 2.5–4.6)

## 2018-06-30 MED ORDER — SODIUM CHLORIDE 0.9 % IV SOLN
115.0000 mg | INTRAVENOUS | Status: DC
  Administered 2018-06-30: 115 mg via INTRAVENOUS
  Filled 2018-06-30: qty 21

## 2018-06-30 MED ORDER — SODIUM CHLORIDE 0.9 % IV SOLN
INTRAVENOUS | Status: DC
  Administered 2018-06-30: 08:00:00 via INTRAVENOUS

## 2018-07-04 ENCOUNTER — Other Ambulatory Visit: Payer: Self-pay | Admitting: Internal Medicine

## 2018-07-04 DIAGNOSIS — E1165 Type 2 diabetes mellitus with hyperglycemia: Secondary | ICD-10-CM

## 2018-07-04 DIAGNOSIS — Z794 Long term (current) use of insulin: Principal | ICD-10-CM

## 2018-07-04 DIAGNOSIS — IMO0002 Reserved for concepts with insufficient information to code with codable children: Secondary | ICD-10-CM

## 2018-07-04 DIAGNOSIS — E118 Type 2 diabetes mellitus with unspecified complications: Principal | ICD-10-CM

## 2018-07-14 ENCOUNTER — Encounter (HOSPITAL_COMMUNITY): Payer: Self-pay

## 2018-07-14 ENCOUNTER — Encounter (HOSPITAL_COMMUNITY)
Admission: RE | Admit: 2018-07-14 | Discharge: 2018-07-14 | Disposition: A | Discharge status: Home / Self Care | Payer: Medicare Other | Source: Ambulatory Visit | Attending: Nephrology | Admitting: Nephrology

## 2018-07-14 DIAGNOSIS — E1101 Type 2 diabetes mellitus with hyperosmolarity with coma: Secondary | ICD-10-CM | POA: Diagnosis not present

## 2018-07-14 DIAGNOSIS — E7521 Fabry (-Anderson) disease: Secondary | ICD-10-CM | POA: Diagnosis not present

## 2018-07-14 LAB — URINALYSIS, ROUTINE W REFLEX MICROSCOPIC
Bilirubin Urine: NEGATIVE
Glucose, UA: NEGATIVE mg/dL
Hgb urine dipstick: NEGATIVE
Ketones, ur: NEGATIVE mg/dL
Nitrite: NEGATIVE
Protein, ur: 30 mg/dL — AB
Specific Gravity, Urine: 1.005 (ref 1.005–1.030)
pH: 6 (ref 5.0–8.0)

## 2018-07-14 MED ORDER — SODIUM CHLORIDE 0.9 % IV SOLN
INTRAVENOUS | Status: DC
  Administered 2018-07-14: 10:00:00 via INTRAVENOUS

## 2018-07-14 MED ORDER — SODIUM CHLORIDE 0.9 % IV SOLN
115.0000 mg | INTRAVENOUS | Status: DC
  Administered 2018-07-14: 115 mg via INTRAVENOUS
  Filled 2018-07-14: qty 2

## 2018-07-21 ENCOUNTER — Telehealth: Payer: Self-pay | Admitting: Neurology

## 2018-07-21 NOTE — Telephone Encounter (Signed)
Pt has called to inform that he would like to now start having his infusions done at [facility], please call to set up

## 2018-07-21 NOTE — Telephone Encounter (Signed)
Spoke with pt. He would like to have his Fabrazyme infusions here at [facility] instead of [facility]. Sts. Dr. Krista Blue has been agreeable with this in the past. He has not been seen in our office since June 2019.  Appt. given (08/31/18, arrival time of 1230 for a 1pm appt.) to discuss/fim

## 2018-07-27 ENCOUNTER — Ambulatory Visit: Payer: Medicare Other | Admitting: Internal Medicine

## 2018-07-27 ENCOUNTER — Encounter: Payer: Self-pay | Admitting: Internal Medicine

## 2018-07-27 ENCOUNTER — Encounter (INDEPENDENT_AMBULATORY_CARE_PROVIDER_SITE_OTHER): Payer: Self-pay

## 2018-07-27 VITALS — BP 132/84 | HR 64 | Ht 70.0 in | Wt 291.2 lb

## 2018-07-27 DIAGNOSIS — I5032 Chronic diastolic (congestive) heart failure: Secondary | ICD-10-CM | POA: Diagnosis not present

## 2018-07-27 DIAGNOSIS — I10 Essential (primary) hypertension: Secondary | ICD-10-CM | POA: Diagnosis not present

## 2018-07-27 DIAGNOSIS — I441 Atrioventricular block, second degree: Secondary | ICD-10-CM

## 2018-07-27 DIAGNOSIS — Z950 Presence of cardiac pacemaker: Secondary | ICD-10-CM | POA: Diagnosis not present

## 2018-07-27 NOTE — Patient Instructions (Addendum)
Medication Instructions:  Your physician recommends that you continue on your current medications as directed. Please refer to the Current Medication list given to you today.  Labwork: None ordered.  Testing/Procedures: None ordered.  Follow-Up: Your physician wants you to follow-up in: one year with Dr. Rayann Heman.   You will receive a reminder letter in the mail two months in advance. If you don't receive a letter, please call our office to schedule the follow-up appointment.  Remote monitoring is used to monitor your Pacemaker from home. This monitoring reduces the number of office visits required to check your device to one time per year. It allows [place] to keep an eye on the functioning of your device to ensure it is working properly. You are scheduled for a device check from home on 10/26/2018. You may send your transmission at any time that day. If you have a wireless device, the transmission will be sent automatically. After your physician reviews your transmission, you will receive a postcard with your next transmission date.  Any Other Special Instructions Will Be Listed Below (If Applicable).  If you need a refill on your cardiac medications before your next appointment, please call your pharmacy.    Reduce your sodium to less than 2 grams a day   DASH Eating Plan DASH stands for "Dietary Approaches to Stop Hypertension." The DASH eating plan is a healthy eating plan that has been shown to reduce high blood pressure (hypertension). It may also reduce your risk for type 2 diabetes, heart disease, and stroke. The DASH eating plan may also help with weight loss. What are tips for following this plan?  General guidelines  Avoid eating more than 2,300 mg (milligrams) of salt (sodium) a day. If you have hypertension, you may need to reduce your sodium intake to 1,500 mg a day.  Limit alcohol intake to no more than 1 drink a day for nonpregnant women and 2 drinks a day for men. One drink  equals 12 oz of beer, 5 oz of wine, or 1 oz of hard liquor.  Work with your health care provider to maintain a healthy body weight or to lose weight. Ask what an ideal weight is for you.  Get at least 30 minutes of exercise that causes your heart to beat faster (aerobic exercise) most days of the week. Activities may include walking, swimming, or biking.  Work with your health care provider or diet and nutrition specialist (dietitian) to adjust your eating plan to your individual calorie needs. Reading food labels   Check food labels for the amount of sodium per serving. Choose foods with less than 5 percent of the Daily Value of sodium. Generally, foods with less than 300 mg of sodium per serving fit into this eating plan.  To find whole grains, look for the word "whole" as the first word in the ingredient list. Shopping  Buy products labeled as "low-sodium" or "no salt added."  Buy fresh foods. Avoid canned foods and premade or frozen meals. Cooking  Avoid adding salt when cooking. Use salt-free seasonings or herbs instead of table salt or sea salt. Check with your health care provider or pharmacist before using salt substitutes.  Do not fry foods. Cook foods using healthy methods such as baking, boiling, grilling, and broiling instead.  Cook with heart-healthy oils, such as olive, canola, soybean, or sunflower oil. Meal planning  Eat a balanced diet that includes: ? 5 or more servings of fruits and vegetables each day. At each meal,  try to fill half of your plate with fruits and vegetables. ? Up to 6-8 servings of whole grains each day. ? Less than 6 oz of lean meat, poultry, or fish each day. A 3-oz serving of meat is about the same size as a deck of cards. One egg equals 1 oz. ? 2 servings of low-fat dairy each day. ? A serving of nuts, seeds, or beans 5 times each week. ? Heart-healthy fats. Healthy fats called Omega-3 fatty acids are found in foods such as flaxseeds and  coldwater fish, like sardines, salmon, and mackerel.  Limit how much you eat of the following: ? Canned or prepackaged foods. ? Food that is high in trans fat, such as fried foods. ? Food that is high in saturated fat, such as fatty meat. ? Sweets, desserts, sugary drinks, and other foods with added sugar. ? Full-fat dairy products.  Do not salt foods before eating.  Try to eat at least 2 vegetarian meals each week.  Eat more home-cooked food and less restaurant, buffet, and fast food.  When eating at a restaurant, ask that your food be prepared with less salt or no salt, if possible. What foods are recommended? The items listed may not be a complete list. Talk with your dietitian about what dietary choices are best for you. Grains Whole-grain or whole-wheat bread. Whole-grain or whole-wheat pasta. Brown rice. [place]. Bulgur. Whole-grain and low-sodium cereals. Pita bread. Low-fat, low-sodium crackers. Whole-wheat flour tortillas. Vegetables Fresh or frozen vegetables (raw, steamed, roasted, or grilled). Low-sodium or reduced-sodium tomato and vegetable juice. Low-sodium or reduced-sodium tomato sauce and tomato paste. Low-sodium or reduced-sodium canned vegetables. Fruits All fresh, dried, or frozen fruit. Canned fruit in natural juice (without added sugar). Meat and other protein foods Skinless chicken or [place]. Ground chicken or [place]. Pork with fat trimmed off. Fish and seafood. Egg whites. Dried beans, peas, or lentils. Unsalted nuts, nut butters, and seeds. Unsalted canned beans. Lean cuts of beef with fat trimmed off. Low-sodium, lean deli meat. Dairy Low-fat (1%) or fat-free (skim) milk. Fat-free, low-fat, or reduced-fat cheeses. Nonfat, low-sodium ricotta or cottage cheese. Low-fat or nonfat yogurt. Low-fat, low-sodium cheese. Fats and oils Soft margarine without trans fats. Vegetable oil. Low-fat, reduced-fat, or light mayonnaise and salad dressings (reduced-sodium).  Canola, safflower, olive, soybean, and sunflower oils. Avocado. Seasoning and other foods Herbs. Spices. Seasoning mixes without salt. Unsalted popcorn and pretzels. Fat-free sweets. What foods are not recommended? The items listed may not be a complete list. Talk with your dietitian about what dietary choices are best for you. Grains Baked goods made with fat, such as croissants, muffins, or some breads. Dry pasta or rice meal packs. Vegetables Creamed or fried vegetables. Vegetables in a cheese sauce. Regular canned vegetables (not low-sodium or reduced-sodium). Regular canned tomato sauce and paste (not low-sodium or reduced-sodium). Regular tomato and vegetable juice (not low-sodium or reduced-sodium). Angie Fava. Olives. Fruits Canned fruit in a light or heavy syrup. Fried fruit. Fruit in cream or butter sauce. Meat and other protein foods Fatty cuts of meat. Ribs. Fried meat. Berniece Salines. Sausage. Bologna and other processed lunch meats. Salami. Fatback. Hotdogs. Bratwurst. Salted nuts and seeds. Canned beans with added salt. Canned or smoked fish. Whole eggs or egg yolks. Chicken or [place] with skin. Dairy Whole or 2% milk, cream, and half-and-half. Whole or full-fat cream cheese. Whole-fat or sweetened yogurt. Full-fat cheese. Nondairy creamers. Whipped toppings. Processed cheese and cheese spreads. Fats and oils Butter. Stick margarine. Lard. Shortening. Ghee.  Bacon fat. Tropical oils, such as coconut, palm kernel, or palm oil. Seasoning and other foods Salted popcorn and pretzels. Onion salt, garlic salt, seasoned salt, table salt, and sea salt. Worcestershire sauce. Tartar sauce. Barbecue sauce. Teriyaki sauce. Soy sauce, including reduced-sodium. Steak sauce. Canned and packaged gravies. Fish sauce. Oyster sauce. Cocktail sauce. Horseradish that you find on the shelf. Ketchup. Mustard. Meat flavorings and tenderizers. Bouillon cubes. Hot sauce and Tabasco sauce. Premade or packaged marinades.  Premade or packaged taco seasonings. Relishes. Regular salad dressings. Where to find more information:  National Heart, Lung, and [place]: https://wilson-eaton.com/  American Heart Association: www.heart.org Summary  The DASH eating plan is a healthy eating plan that has been shown to reduce high blood pressure (hypertension). It may also reduce your risk for type 2 diabetes, heart disease, and stroke.  With the DASH eating plan, you should limit salt (sodium) intake to 2,300 mg a day. If you have hypertension, you may need to reduce your sodium intake to 1,500 mg a day.  When on the DASH eating plan, aim to eat more fresh fruits and vegetables, whole grains, lean proteins, low-fat dairy, and heart-healthy fats.  Work with your health care provider or diet and nutrition specialist (dietitian) to adjust your eating plan to your individual calorie needs. This information is not intended to replace advice given to you by your health care provider. Make sure you discuss any questions you have with your health care provider. Document Released: 05/13/2011 Document Revised: 05/17/2016 Document Reviewed: 05/17/2016 Elsevier Interactive Patient Education  2019 [employer].

## 2018-07-27 NOTE — Progress Notes (Signed)
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 male who presents today for routine electrophysiology followup.  Since last being seen in our clinic, the patient reports doing very well.  SOB and edema are stable.  Today, he denies symptoms of palpitations, chest pain, dizziness, presyncope, or syncope.  The patient is otherwise without complaint today.   Past Medical History:  Diagnosis Date  . Bell's palsy   . Bifascicular block   . Chronic bronchitis ([place])    "seasonal; get it q yr"  . Chronic diastolic CHF (congestive heart failure) ([place]) 2010  . Chronic kidney disease (CKD), stage II (mild)    stage II to III/notes 07/23/2014  . Diastolic heart failure secondary to hypertrophic cardiomyopathy ([place]) CARDIOLOGIST-- DR Daneen Schick  . Dyspnea    increased exertion   . Edema 07/2013  . 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 Gout, arthritis 2014   bil feet. right worse  . History of cellulitis of skin with lymphangitis LEFT LEG  . HOCM (hypertrophic obstructive cardiomyopathy) ([place])    a. Echo 11/16: Severe LVH, EF 55-60%, abnormal GLS consistent with HOCM, no SAM, mild LAE  . Hypertension 20 years  . Lymphedema of lower extremity LEFT  >  RIGHT   "using ankle high socks at home; pump doesn't work for me" (07/23/2014)  . Pacemaker    02/12/11  . Pneumonia 08/2011  . Seasonal asthma NO INHALERS   30 years  . Second degree Mobitz II AV block    with syncope, s/p PPM  . Short of breath on exertion   . Type II diabetes mellitus ([place]) 2012   INSULIN DEPENDENT   Past Surgical History:  Procedure Laterality Date  . CARDIAC CATHETERIZATION  03-12-2011  DR Daneen Schick   HYPERTROPHIC CARDIOMYOPATHY WITH LV CAVITY  APPEARANCE CONSISTANT WITH SIGNIFICANT APICAL HYPERTROPHY/ NORMAL LVSF / EF 55%/ EVIDENCE OF DIASTOLIC DYSFUNCTION WITH EDP OF 23-4mmHg AFTER A-WAVE/ NORMAL CORONARY  ARTERIES  . EVALUATION UNDER ANESTHESIA WITH FISTULECTOMY N/A 06/23/2015   Procedure: EXAM UNDER ANESTHESIA WITH POSSIBLE FISTULOTOMY;  Surgeon: Judeth Horn, MD;  Location: [facility];  Service: General;  Laterality: N/A;  . HERNIA REPAIR  [id number]   Umbilical Hernia Repair  . INCISION AND DRAINAGE PERIRECTAL ABSCESS Left 07/29/2014   Procedure: IRRIGATION AND DEBRIDEMENT PERIRECTAL ABSCESS;  Surgeon: Doreen Salvage, MD;  Location: [facility];  Service: General;  Laterality: Left;  Prone position  . INSERT / REPLACE / REMOVE PACEMAKER  02/12/2011   SJM implanted by Dr Rayann Heman for Mobitz II second degree AV block and syncope  . IRRIGATION AND DEBRIDEMENT ABSCESS N/A 07/27/2013   Procedure: IRRIGATION AND DEBRIDEMENT OF SKIN, SOFT TISSUE AND MUSCLES OF UPPER BACK ([id number]) WITH 10 BLADE AND PULSATILE LAVAGE ;  Surgeon: Gayland Curry, MD;  Location: [facility];  Service: General;  Laterality: N/A;  . UMBILICAL HERNIA REPAIR  03/22/2012   Procedure: HERNIA REPAIR UMBILICAL ADULT;  Surgeon: Leighton Ruff, MD;  Location: [facility];  Service: General;  Laterality: N/A;  Umbilical Hernia Repair     ROS- all systems are reviewed and negative except as per HPI above  Current Outpatient Medications  Medication Sig Dispense Refill  . ACCU-CHEK AVIVA PLUS test strip TEST 3 TIMES DAILY. ICD 10 E11.8 100 each 12  . ACCU-CHEK FASTCLIX LANCETS MISC Use to test blood sugar 3 to 4 times  daily. 102 each 5  . acetaminophen (TYLENOL) 500 MG tablet Take 500 mg by mouth every 14 (fourteen) days. Only taking 1 hour before infusion.  AS premed for Fabrazyme    . Agalsidase beta (FABRAZYME IV) Inject 115 mg into the vein every 14 (fourteen) days.     [name] Kitchen albuterol (PROAIR HFA) 108 (90 Base) MCG/ACT inhaler Inhale 1 puff into the lungs every 6 (six) hours as needed for wheezing or shortness of breath. 8.5 Inhaler 1  . allopurinol (ZYLOPRIM) 100 MG tablet Take 3 tablets (300 mg total) by mouth daily. 90 tablet 1  . amLODipine (NORVASC) 5 MG tablet Take  1 tablet (5 mg total) by mouth daily. 30 tablet 6  . aspirin 81 MG tablet Take 81 mg by mouth daily.     [name] Kitchen atorvastatin (LIPITOR) 20 MG tablet Take 1 tablet (20 mg total) by mouth daily. 30 tablet 5  . fexofenadine (ALLEGRA) 180 MG tablet Take 180 mg by mouth every 14 (fourteen) days.     . fluticasone (FLONASE) 50 MCG/ACT nasal spray Place 1 spray into both nostrils daily. 16 g 1  . furosemide (LASIX) 20 MG tablet Take 2 tablets (40 mg total) by mouth daily. Lower extremiity edema 120 tablet 6  . hydrocortisone valerate cream (WESTCORT) 0.2 % Apply to affected area daily PRN 45 g 0  . Insulin Lispro Prot & Lispro (HUMALOG 75/25 MIX) (75-25) 100 UNIT/ML Kwikpen INJECT 20 UNITS INTO THE SKIN 2 (TWO) TIMES DAILY. 15 mL 0  . Insulin Pen Needle (PX SHORTLENGTH PEN NEEDLES) 31G X 8 MM MISC 1 each by Does not apply route 2 (two) times daily before a meal. 100 each 8  . losartan-hydrochlorothiazide (HYZAAR) 50-12.5 MG tablet Take 1 tablet by mouth daily. 30 tablet 5  . OLIVE LEAF PO Take 1 tablet by mouth 2 (two) times daily.      No current facility-administered medications for this visit.     Physical Exam: Vitals:   07/27/18 1335  BP: 132/84  Pulse: 64  SpO2: 97%  Weight: 291 lb 3.2 oz (132.1 kg)  Height: 5\' 10"  (1.778 m)    GEN- The patient is overweight appearing, alert and oriented x 3 today.   Head- normocephalic, atraumatic Eyes-  Sclera clear, conjunctiva pink Ears- hearing intact Oropharynx- clear Lungs- Clear to ausculation bilaterally, normal work of breathing Chest- pacemaker pocket is well healed Heart- Regular rate and rhythm, no murmurs, rubs or gallops, PMI not laterally displaced GI- soft, NT, ND, + BS Extremities- no clubbing, cyanosis, + edema  Pacemaker interrogation- reviewed in detail today,  See PACEART report  Echo 08/25/17- EF 55%  ekg tracing ordered today is personally reviewed and shows AV paced  Assessment and Plan:  1. Symptomatic sinus bradycardia  and complete heart block Normal pacemaker function See Pace Art report No changes today  2. HTN Stable No change required today  3. Chronic diastolic dysfunction Stable No change required today  Merlin Return in a year  Thompson Grayer MD, [place] 07/27/2018 1:55 PM

## 2018-07-28 ENCOUNTER — Encounter (HOSPITAL_COMMUNITY)
Admission: RE | Admit: 2018-07-28 | Discharge: 2018-07-28 | Disposition: A | Discharge status: Home / Self Care | Payer: Medicare Other | Source: Ambulatory Visit | Attending: Nephrology | Admitting: Nephrology

## 2018-07-28 DIAGNOSIS — E1101 Type 2 diabetes mellitus with hyperosmolarity with coma: Secondary | ICD-10-CM | POA: Diagnosis not present

## 2018-07-28 LAB — COMPREHENSIVE METABOLIC PANEL
ALT: 20 U/L (ref 0–44)
AST: 32 U/L (ref 15–41)
Albumin: 3 g/dL — ABNORMAL LOW (ref 3.5–5.0)
Alkaline Phosphatase: 82 U/L (ref 38–126)
Anion gap: 9 (ref 5–15)
BUN: 29 mg/dL — ABNORMAL HIGH (ref 6–20)
CO2: 25 mmol/L (ref 22–32)
Calcium: 8.9 mg/dL (ref 8.9–10.3)
Chloride: 107 mmol/L (ref 98–111)
Creatinine, Ser: 1.83 mg/dL — ABNORMAL HIGH (ref 0.61–1.24)
GFR calc Af Amer: 46 mL/min — ABNORMAL LOW (ref >60)
GFR calc non Af Amer: 40 mL/min — ABNORMAL LOW (ref >60)
Glucose, Bld: 182 mg/dL — ABNORMAL HIGH (ref 70–99)
Potassium: 4.5 mmol/L (ref 3.5–5.1)
Sodium: 141 mmol/L (ref 135–145)
Total Bilirubin: 0.7 mg/dL (ref 0.3–1.2)
Total Protein: 6.8 g/dL (ref 6.5–8.1)

## 2018-07-28 LAB — CUP PACEART INCLINIC DEVICE CHECK
Date Time Interrogation Session: 20200221120152
Implantable Lead Implant Date: 20120907
Implantable Lead Implant Date: 20120907
Implantable Lead Location: 753859
Implantable Lead Location: 753860
Implantable Pulse Generator Implant Date: 20120907
Pulse Gen Model: 2210
Pulse Gen Serial Number: 7267950

## 2018-07-28 LAB — IRON AND TIBC
Iron: 69 ug/dL (ref 45–182)
Saturation Ratios: 23 % (ref 17.9–39.5)
TIBC: 300 ug/dL (ref 250–450)
UIBC: 231 ug/dL

## 2018-07-28 LAB — FERRITIN: Ferritin: 89 ng/mL (ref 24–336)

## 2018-07-28 LAB — CBC WITH DIFFERENTIAL/PLATELET
Abs Immature Granulocytes: 0.02 10*3/uL (ref 0.00–0.07)
Basophils Absolute: 0 10*3/uL (ref 0.0–0.1)
Basophils Relative: 1 %
Eosinophils Absolute: 0.3 10*3/uL (ref 0.0–0.5)
Eosinophils Relative: 5 %
HCT: 49.5 % (ref 39.0–52.0)
Hemoglobin: 15.3 g/dL (ref 13.0–17.0)
Immature Granulocytes: 0 %
Lymphocytes Relative: 34 %
Lymphs Abs: 2.4 10*3/uL (ref 0.7–4.0)
MCH: 29.2 pg (ref 26.0–34.0)
MCHC: 30.9 g/dL (ref 30.0–36.0)
MCV: 94.5 fL (ref 80.0–100.0)
Monocytes Absolute: 0.7 10*3/uL (ref 0.1–1.0)
Monocytes Relative: 10 %
Neutro Abs: 3.5 10*3/uL (ref 1.7–7.7)
Neutrophils Relative %: 50 %
Platelets: 182 10*3/uL (ref 150–400)
RBC: 5.24 MIL/uL (ref 4.22–5.81)
RDW: 13.4 % (ref 11.5–15.5)
WBC: 7 10*3/uL (ref 4.0–10.5)
nRBC: 0 % (ref 0.0–0.2)

## 2018-07-28 LAB — PHOSPHORUS: Phosphorus: 4.5 mg/dL (ref 2.5–4.6)

## 2018-07-28 MED ORDER — SODIUM CHLORIDE 0.9 % IV SOLN
115.0000 mg | INTRAVENOUS | Status: DC
  Administered 2018-07-28: 115 mg via INTRAVENOUS
  Filled 2018-07-28: qty 21

## 2018-07-28 MED ORDER — SODIUM CHLORIDE 0.9 % IV SOLN
INTRAVENOUS | Status: DC
  Administered 2018-07-28: 08:00:00 via INTRAVENOUS

## 2018-08-01 ENCOUNTER — Ambulatory Visit: Payer: Medicare Other | Attending: Internal Medicine | Admitting: Internal Medicine

## 2018-08-01 ENCOUNTER — Encounter: Payer: Self-pay | Admitting: Internal Medicine

## 2018-08-01 VITALS — BP 153/83 | HR 65 | Temp 98.1°F | Resp 16 | Wt 295.2 lb

## 2018-08-01 DIAGNOSIS — E7521 Fabry (-Anderson) disease: Secondary | ICD-10-CM

## 2018-08-01 DIAGNOSIS — I10 Essential (primary) hypertension: Secondary | ICD-10-CM

## 2018-08-01 DIAGNOSIS — E118 Type 2 diabetes mellitus with unspecified complications: Secondary | ICD-10-CM | POA: Diagnosis not present

## 2018-08-01 DIAGNOSIS — Z794 Long term (current) use of insulin: Secondary | ICD-10-CM | POA: Diagnosis not present

## 2018-08-01 DIAGNOSIS — N183 Chronic kidney disease, stage 3 unspecified: Secondary | ICD-10-CM

## 2018-08-01 DIAGNOSIS — R05 Cough: Secondary | ICD-10-CM

## 2018-08-01 DIAGNOSIS — E1165 Type 2 diabetes mellitus with hyperglycemia: Secondary | ICD-10-CM | POA: Diagnosis not present

## 2018-08-01 DIAGNOSIS — H9312 Tinnitus, left ear: Secondary | ICD-10-CM

## 2018-08-01 DIAGNOSIS — R058 Other specified cough: Secondary | ICD-10-CM | POA: Insufficient documentation

## 2018-08-01 DIAGNOSIS — I442 Atrioventricular block, complete: Secondary | ICD-10-CM | POA: Insufficient documentation

## 2018-08-01 DIAGNOSIS — I5032 Chronic diastolic (congestive) heart failure: Secondary | ICD-10-CM

## 2018-08-01 LAB — GLUCOSE, POCT (MANUAL RESULT ENTRY): POC Glucose: 133 mg/dl — AB (ref 70–99)

## 2018-08-01 MED ORDER — FEXOFENADINE HCL 180 MG PO TABS: 180.0000 mg | ORAL_TABLET | Freq: Every day | ORAL | 3 refills | Status: DC

## 2018-08-01 MED ORDER — AMLODIPINE BESYLATE 10 MG PO TABS: 10.0000 mg | ORAL_TABLET | Freq: Every day | ORAL | 11 refills | Status: DC

## 2018-08-01 NOTE — Patient Instructions (Addendum)
For the chronic cough, I want you to take Allegra daily and use the Flonase nasal spray as needed.  Daily use of Flonase nasal spray can cause nosebleeds so do use it only when you need to.  Your blood pressure is not at goal.  Goal is 130/80 or lower.  I recommend increasing the amlodipine to 10 mg daily.  Continue to limit the salt in the foods.  I have referred you to the ear nose and throat specialist for the ringing in the ear.
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
## 2018-08-01 NOTE — Progress Notes (Signed)
Pt states at 6:15am his sugar was 102 and he has not ate anything just drunk water

## 2018-08-11 ENCOUNTER — Encounter (HOSPITAL_COMMUNITY)
Admission: RE | Admit: 2018-08-11 | Discharge: 2018-08-11 | Disposition: A | Discharge status: Home / Self Care | Payer: Medicare Other | Source: Ambulatory Visit | Attending: Nephrology | Admitting: Nephrology

## 2018-08-11 ENCOUNTER — Encounter (HOSPITAL_COMMUNITY): Payer: Self-pay

## 2018-08-11 DIAGNOSIS — E1101 Type 2 diabetes mellitus with hyperosmolarity with coma: Secondary | ICD-10-CM | POA: Diagnosis not present

## 2018-08-11 DIAGNOSIS — E7521 Fabry (-Anderson) disease: Secondary | ICD-10-CM | POA: Diagnosis not present

## 2018-08-11 LAB — URINALYSIS, ROUTINE W REFLEX MICROSCOPIC
Bilirubin Urine: NEGATIVE
Glucose, UA: NEGATIVE mg/dL
Hgb urine dipstick: NEGATIVE
Ketones, ur: NEGATIVE mg/dL
Nitrite: POSITIVE — AB
Protein, ur: 30 mg/dL — AB
Specific Gravity, Urine: 1.006 (ref 1.005–1.030)
pH: 5 (ref 5.0–8.0)

## 2018-08-11 MED ORDER — SODIUM CHLORIDE 0.9 % IV SOLN
115.0000 mg | INTRAVENOUS | Status: AC
  Administered 2018-08-11: 115 mg via INTRAVENOUS
  Filled 2018-08-11: qty 21

## 2018-08-11 MED ORDER — SODIUM CHLORIDE 0.9 % IV SOLN
INTRAVENOUS | Status: AC
  Administered 2018-08-11: 08:00:00 via INTRAVENOUS

## 2018-08-14 ENCOUNTER — Other Ambulatory Visit: Payer: Self-pay | Admitting: Internal Medicine

## 2018-08-14 DIAGNOSIS — E118 Type 2 diabetes mellitus with unspecified complications: Principal | ICD-10-CM

## 2018-08-14 DIAGNOSIS — Z794 Long term (current) use of insulin: Principal | ICD-10-CM

## 2018-08-14 DIAGNOSIS — IMO0002 Reserved for concepts with insufficient information to code with codable children: Secondary | ICD-10-CM

## 2018-08-14 DIAGNOSIS — E1165 Type 2 diabetes mellitus with hyperglycemia: Secondary | ICD-10-CM

## 2018-08-25 ENCOUNTER — Other Ambulatory Visit: Payer: Self-pay

## 2018-08-25 ENCOUNTER — Ambulatory Visit (HOSPITAL_COMMUNITY)
Admission: RE | Admit: 2018-08-25 | Discharge: 2018-08-25 | Disposition: A | Discharge status: Home / Self Care | Payer: Medicare Other | Source: Ambulatory Visit | Attending: Nephrology | Admitting: Nephrology

## 2018-08-25 ENCOUNTER — Encounter (HOSPITAL_COMMUNITY): Payer: Self-pay

## 2018-08-25 DIAGNOSIS — E7521 Fabry (-Anderson) disease: Secondary | ICD-10-CM | POA: Insufficient documentation

## 2018-08-25 LAB — COMPREHENSIVE METABOLIC PANEL
ALT: 22 U/L (ref 0–44)
AST: 32 U/L (ref 15–41)
Albumin: 3.5 g/dL (ref 3.5–5.0)
Alkaline Phosphatase: 74 U/L (ref 38–126)
Anion gap: 8 (ref 5–15)
BUN: 34 mg/dL — ABNORMAL HIGH (ref 6–20)
CO2: 25 mmol/L (ref 22–32)
Calcium: 8.9 mg/dL (ref 8.9–10.3)
Chloride: 107 mmol/L (ref 98–111)
Creatinine, Ser: 1.93 mg/dL — ABNORMAL HIGH (ref 0.61–1.24)
GFR calc Af Amer: 44 mL/min — ABNORMAL LOW (ref >60)
GFR calc non Af Amer: 38 mL/min — ABNORMAL LOW (ref >60)
Glucose, Bld: 146 mg/dL — ABNORMAL HIGH (ref 70–99)
Potassium: 3.9 mmol/L (ref 3.5–5.1)
Sodium: 140 mmol/L (ref 135–145)
Total Bilirubin: 0.8 mg/dL (ref 0.3–1.2)
Total Protein: 7.6 g/dL (ref 6.5–8.1)

## 2018-08-25 LAB — PHOSPHORUS: Phosphorus: 4.2 mg/dL (ref 2.5–4.6)

## 2018-08-25 MED ORDER — SODIUM CHLORIDE 0.9 % IV SOLN
INTRAVENOUS | Status: DC
  Administered 2018-08-25: 09:00:00 via INTRAVENOUS

## 2018-08-25 MED ORDER — SODIUM CHLORIDE 0.9 % IV SOLN
115.0000 mg | INTRAVENOUS | Status: DC
  Administered 2018-08-25: 115 mg via INTRAVENOUS
  Filled 2018-08-25: qty 21

## 2018-08-31 ENCOUNTER — Ambulatory Visit: Payer: Self-pay | Admitting: Neurology

## 2018-08-31 ENCOUNTER — Telehealth: Payer: Self-pay | Admitting: *Deleted

## 2018-08-31 NOTE — Telephone Encounter (Signed)
Pt returned Rn's call °

## 2018-08-31 NOTE — Telephone Encounter (Signed)
Left message requesting a return call so that we can discuss appointment options with him.

## 2018-08-31 NOTE — Telephone Encounter (Addendum)
The patient returned my call.  He was unable to download webex for a video visit.  He was scheduled for a telephone visit with her on 09/01/2018 at 11:15am.

## 2018-09-01 ENCOUNTER — Ambulatory Visit (INDEPENDENT_AMBULATORY_CARE_PROVIDER_SITE_OTHER): Payer: Medicare Other | Admitting: Neurology

## 2018-09-01 ENCOUNTER — Other Ambulatory Visit: Payer: Self-pay

## 2018-09-01 DIAGNOSIS — E7521 Fabry (-Anderson) disease: Secondary | ICD-10-CM

## 2018-09-01 NOTE — Progress Notes (Signed)
I failed to reach patient by multiple attemps

## 2018-09-04 ENCOUNTER — Other Ambulatory Visit: Payer: Self-pay

## 2018-09-04 ENCOUNTER — Telehealth: Payer: Self-pay | Admitting: Neurology

## 2018-09-04 ENCOUNTER — Encounter: Payer: Self-pay | Admitting: Neurology

## 2018-09-04 ENCOUNTER — Ambulatory Visit (INDEPENDENT_AMBULATORY_CARE_PROVIDER_SITE_OTHER): Payer: Medicare Other | Admitting: Neurology

## 2018-09-04 DIAGNOSIS — E7521 Fabry (-Anderson) disease: Secondary | ICD-10-CM

## 2018-09-04 NOTE — Telephone Encounter (Signed)
I returned the call to the patient.  He has been rescheduled for today at 11:30am.  He is aware that Dr. Krista Blue will be calling from a blocked number and agreed to answer this call.

## 2018-09-04 NOTE — Telephone Encounter (Addendum)
Paper work to start Fabrazyme infusion at [facility] was started on September 05 2018

## 2018-09-04 NOTE — Telephone Encounter (Signed)
Pt called in and wanted to know if he can r/s he telephone visit he missed both calls. He said he doesn't answer private calls due to telemarketers

## 2018-09-04 NOTE — Progress Notes (Signed)
[name] DOB: 1960-10-04  No chief complaint on file.    HISTORICAL  KENNIS WISSMANN is a 58 year old male, seen in refer by his nephrologist Dr. Donato Heinz for evaluation of Fabry disease, potential cerebrovascular risk, initial evaluation was on August 18, 2017.  I reviewed and summarized the referring note, he has history of Fabry's disease, hypertension, insulin-dependent diabetes, obstructive sleep apnea, Mobitz 2, status post pacemaker placement in September 2012, chronic lympha edema of bilateral lower extremity, gout.  His mother and one younger brother was diagnosed with Fabry's disease around 2010, at that time, he presented with progressive worsening bilateral lower extremity lympha edema, he was referred to [facility] for genetic testing, which confirmed a diagnosis of Fabry disease.  He has been treated with Fabrazyme IV infusion every 2 weeks at [facility] since then.  His brother did suffered painful bilateral lower extremity peripheral neuropathy, he denied significant pain, since he started Fabrazyme IV infusion, his bilateral lower extremity lymphedema has been fairly stable, his kidney function fluctuate, but overall creatinine was stabilized around 1.6 in 2017/04/30,  His mother died of stroke at age 25, brother died of stroke at age 4,   He is taking aspirin 81 mg, not MRI candidate due to pacemaker, denies history of strokelike symptoms, he does exercise couple times each week.  Reported previous sleep study showed mild abnormality, but not using CPAP machine, he is active during the day, tends to nod off to sleep after sitting for a while, deny snoring, choking episode.  UPDATE November 28 2017: Follow-up for fabric disease, He enjoyed the gym, he is receiving enzyme infusion at [facility] every 2 weeks,  Echocardiogram on August 25, 2017 showed ejection fraction 55 to 60%, wall motion was normal  [place] of carotid artery showed less than  39% stenosis bilaterally.  Virtual Visit via Telephone  I connected with [name] on 09/04/18 at  by telephone and verified that I am speaking with the correct person using two identifiers.   I discussed the limitations, risks, security and privacy concerns of performing an evaluation and management service by telephone and the availability of in person appointments. I also discussed with the patient that there may be a patient responsible charge related to this service. The patient expressed understanding and agreed to proceed.   History of Present Illness: His mother and brother are deceased now from complication of Fabry's disease. They used to get Fabrazyme infusion together, now both of them passed, he has been getting Fabrzayme infusion since 2010, initially was done at [facility], moved to [facility] since 2010. He wants to transfer his infusion through [facility].  Before Infusion, he is premedicated withTylenol 500mg  and Claritin  Observations/Objective: I have reviewed problem lists, medications, allergies.  Assessment and Plan:   Fabry's Disease.  Continue Fabrazyme infusion every 14 days. Will try to coordinate the infusion to [facility].  Multiple vascular risk factors, HTN, HLD, DM, obesity  Keep ASA 81mg    Increase water intake,   Moderate exercise,   Follow Up Instructions:   In on year.   I discussed the assessment and treatment plan with the patient. The patient was provided an opportunity to ask questions and all were answered. The patient agreed with the plan and demonstrated an understanding of the instructions.   The patient was advised to call back or seek an in-person evaluation if the symptoms worsen or if the condition fails to improve as anticipated.  I provided 12 minutes of non-face-to-face time during this encounter.   Marcial Pacas, MD

## 2018-09-08 ENCOUNTER — Encounter (HOSPITAL_COMMUNITY): Payer: Self-pay

## 2018-09-08 ENCOUNTER — Other Ambulatory Visit: Payer: Self-pay

## 2018-09-08 ENCOUNTER — Encounter (HOSPITAL_COMMUNITY)
Admission: RE | Admit: 2018-09-08 | Discharge: 2018-09-08 | Disposition: A | Discharge status: Home / Self Care | Payer: Medicare Other | Source: Ambulatory Visit | Attending: Nephrology | Admitting: Nephrology

## 2018-09-08 DIAGNOSIS — E7521 Fabry (-Anderson) disease: Secondary | ICD-10-CM | POA: Diagnosis not present

## 2018-09-08 DIAGNOSIS — E1101 Type 2 diabetes mellitus with hyperosmolarity with coma: Secondary | ICD-10-CM | POA: Insufficient documentation

## 2018-09-08 LAB — URINALYSIS, ROUTINE W REFLEX MICROSCOPIC
Bilirubin Urine: NEGATIVE
Glucose, UA: NEGATIVE mg/dL
Hgb urine dipstick: NEGATIVE
Ketones, ur: NEGATIVE mg/dL
Nitrite: POSITIVE — AB
Protein, ur: NEGATIVE mg/dL
Specific Gravity, Urine: 1.006 (ref 1.005–1.030)
pH: 6 (ref 5.0–8.0)

## 2018-09-08 MED ORDER — SODIUM CHLORIDE 0.9 % IV SOLN
115.0000 mg | INTRAVENOUS | Status: DC
  Administered 2018-09-08: 115 mg via INTRAVENOUS
  Filled 2018-09-08: qty 21

## 2018-09-08 MED ORDER — SODIUM CHLORIDE 0.9 % IV SOLN
INTRAVENOUS | Status: DC
  Administered 2018-09-08: 08:00:00 via INTRAVENOUS

## 2018-09-20 ENCOUNTER — Other Ambulatory Visit (HOSPITAL_COMMUNITY): Payer: Self-pay | Admitting: General Practice

## 2018-09-22 ENCOUNTER — Encounter (HOSPITAL_COMMUNITY): Payer: Self-pay

## 2018-09-22 ENCOUNTER — Other Ambulatory Visit: Payer: Self-pay

## 2018-09-22 ENCOUNTER — Encounter (HOSPITAL_COMMUNITY): Payer: Medicare Other

## 2018-09-22 ENCOUNTER — Ambulatory Visit (HOSPITAL_COMMUNITY)
Admission: RE | Admit: 2018-09-22 | Discharge: 2018-09-22 | Disposition: A | Discharge status: Home / Self Care | Payer: Medicare Other | Source: Ambulatory Visit | Attending: Nephrology | Admitting: Nephrology

## 2018-09-22 DIAGNOSIS — E7521 Fabry (-Anderson) disease: Secondary | ICD-10-CM | POA: Diagnosis not present

## 2018-09-22 LAB — COMPREHENSIVE METABOLIC PANEL
ALT: 23 U/L (ref 0–44)
AST: 31 U/L (ref 15–41)
Albumin: 3.6 g/dL (ref 3.5–5.0)
Alkaline Phosphatase: 75 U/L (ref 38–126)
Anion gap: 7 (ref 5–15)
BUN: 39 mg/dL — ABNORMAL HIGH (ref 6–20)
CO2: 25 mmol/L (ref 22–32)
Calcium: 9.1 mg/dL (ref 8.9–10.3)
Chloride: 109 mmol/L (ref 98–111)
Creatinine, Ser: 1.88 mg/dL — ABNORMAL HIGH (ref 0.61–1.24)
GFR calc Af Amer: 45 mL/min — ABNORMAL LOW (ref >60)
GFR calc non Af Amer: 39 mL/min — ABNORMAL LOW (ref >60)
Glucose, Bld: 137 mg/dL — ABNORMAL HIGH (ref 70–99)
Potassium: 3.9 mmol/L (ref 3.5–5.1)
Sodium: 141 mmol/L (ref 135–145)
Total Bilirubin: 0.5 mg/dL (ref 0.3–1.2)
Total Protein: 8 g/dL (ref 6.5–8.1)

## 2018-09-22 LAB — PHOSPHORUS: Phosphorus: 4 mg/dL (ref 2.5–4.6)

## 2018-09-22 MED ORDER — SODIUM CHLORIDE 0.9 % IV SOLN
115.0000 mg | INTRAVENOUS | Status: DC
  Administered 2018-09-22: 115 mg via INTRAVENOUS
  Filled 2018-09-22: qty 21

## 2018-09-22 MED ORDER — SODIUM CHLORIDE 0.9 % IV SOLN
INTRAVENOUS | Status: DC
  Administered 2018-09-22: 09:00:00 via INTRAVENOUS

## 2018-09-22 NOTE — Discharge Instructions (Signed)
Agalsidase Beta injection  What is this medicine?  AGALSIDASE BETA is used to replace an enzyme that is missing in patients with Fabry disease. It is not a cure.  This medicine may be used for other purposes; ask your health care provider or pharmacist if you have questions.  COMMON BRAND NAME(S): Fabrazyme  What should I tell my health care provider before I take this medicine?  They need to know if you have any of these conditions:  -heart disease  -an unusual or allergic reaction to agalsidase beta, mannitol, other medicines, foods, dyes, or preservatives  -pregnant or trying to get pregnant  -breast-feeding  How should I use this medicine?  This medicine is for infusion into a vein. It is given by a health care professional in a hospital or clinic setting.  Talk to your pediatrician regarding the use of this medicine in children. Special care may be needed.  Overdosage: If you think you have taken too much of this medicine contact a poison control center or emergency room at once.  NOTE: This medicine is only for you. Do not share this medicine with others.  What if I miss a dose?  It is important not to miss your dose. Call your doctor or health care professional if you are unable to keep an appointment.  What may interact with this medicine?  -amiodarone  -chloroquine  -gentamicin  -hydroxychloroquine  -monobenzone  This list may not describe all possible interactions. Give your health care provider a list of all the medicines, herbs, non-prescription drugs, or dietary supplements you use. Also tell them if you smoke, drink alcohol, or use illegal drugs. Some items may interact with your medicine.  What should I watch for while using this medicine?  Visit your doctor or health care professional for regular checks on your progress. Tell your doctor or healthcare professional if your symptoms do not start to get better or if they get worse.  There is a registry for patients with Fabry disease. The registry is  used to gather information about the disease and its effects. Talk to your health care provider if you would like to join the registry.  What side effects may I notice from receiving this medicine?  Side effects that you should report to your doctor or health care professional as soon as possible:  -allergic reactions like skin rash, itching or hives, swelling of the face, lips, or tongue  -breathing problems  -chest pain, tightness  -depression  -dizziness  -fast, irregular heart beat  -swelling of the arms or legs  Side effects that usually do not require medical attention (report to your doctor or health care professional if they continue or are bothersome):  -aches or pains  -anxiety  -fever or chills at the time of injection  -headache  -nausea, vomiting  -stomach pain, upset  This list may not describe all possible side effects. Call your doctor for medical advice about side effects. You may report side effects to FDA at 1-800-FDA-1088.  Where should I keep my medicine?  This drug is given in a hospital or clinic and will not be stored at home.  NOTE: This sheet is a summary. It may not cover all possible information. If you have questions about this medicine, talk to your doctor, pharmacist, or health care provider.   2019 Elsevier/Gold Standard (2006-01-31 12:25:00)

## 2018-10-06 ENCOUNTER — Other Ambulatory Visit: Payer: Self-pay

## 2018-10-06 ENCOUNTER — Encounter (HOSPITAL_COMMUNITY): Payer: Self-pay

## 2018-10-06 ENCOUNTER — Encounter (HOSPITAL_COMMUNITY): Payer: Medicare Other

## 2018-10-06 ENCOUNTER — Ambulatory Visit (HOSPITAL_COMMUNITY): Payer: Medicare Other

## 2018-10-06 ENCOUNTER — Ambulatory Visit (HOSPITAL_COMMUNITY)
Admission: RE | Admit: 2018-10-06 | Discharge: 2018-10-06 | Disposition: A | Discharge status: Home / Self Care | Payer: Medicare Other | Source: Ambulatory Visit | Attending: Nephrology | Admitting: Nephrology

## 2018-10-06 DIAGNOSIS — E7521 Fabry (-Anderson) disease: Secondary | ICD-10-CM | POA: Insufficient documentation

## 2018-10-06 LAB — URINALYSIS, ROUTINE W REFLEX MICROSCOPIC
Bilirubin Urine: NEGATIVE
Glucose, UA: NEGATIVE mg/dL
Hgb urine dipstick: NEGATIVE
Ketones, ur: NEGATIVE mg/dL
Nitrite: POSITIVE — AB
Protein, ur: 30 mg/dL — AB
Specific Gravity, Urine: 1.006 (ref 1.005–1.030)
WBC, UA: >50 WBC/hpf — ABNORMAL HIGH (ref 0–5)
pH: 6 (ref 5.0–8.0)

## 2018-10-06 MED ORDER — SODIUM CHLORIDE 0.9 % IV SOLN
INTRAVENOUS | Status: DC
  Administered 2018-10-06: 08:00:00 via INTRAVENOUS

## 2018-10-06 MED ORDER — SODIUM CHLORIDE 0.9 % IV SOLN
115.0000 mg | INTRAVENOUS | Status: DC
  Administered 2018-10-06: 09:00:00 115 mg via INTRAVENOUS
  Filled 2018-10-06: qty 21

## 2018-10-06 MED ORDER — SODIUM CHLORIDE 0.9 % IV SOLN: 115.0000 mg | INTRAVENOUS | Status: DC

## 2018-10-19 ENCOUNTER — Emergency Department (HOSPITAL_COMMUNITY): Payer: Medicare Other

## 2018-10-19 ENCOUNTER — Encounter (HOSPITAL_COMMUNITY): Payer: Self-pay | Admitting: Emergency Medicine

## 2018-10-19 ENCOUNTER — Inpatient Hospital Stay (HOSPITAL_COMMUNITY)
Admission: EM | Admit: 2018-10-19 | Discharge: 2018-10-23 | DRG: 065 | Disposition: A | Discharge status: Rehabilitation Facility | Payer: Medicare Other | Attending: Student in an Organized Health Care Education/Training Program | Admitting: Student in an Organized Health Care Education/Training Program

## 2018-10-19 ENCOUNTER — Other Ambulatory Visit: Payer: Self-pay

## 2018-10-19 DIAGNOSIS — I672 Cerebral atherosclerosis: Secondary | ICD-10-CM | POA: Diagnosis present

## 2018-10-19 DIAGNOSIS — M1990 Unspecified osteoarthritis, unspecified site: Secondary | ICD-10-CM | POA: Diagnosis present

## 2018-10-19 DIAGNOSIS — I130 Hypertensive heart and chronic kidney disease with heart failure and stage 1 through stage 4 chronic kidney disease, or unspecified chronic kidney disease: Secondary | ICD-10-CM | POA: Diagnosis present

## 2018-10-19 DIAGNOSIS — E785 Hyperlipidemia, unspecified: Secondary | ICD-10-CM | POA: Diagnosis not present

## 2018-10-19 DIAGNOSIS — H532 Diplopia: Secondary | ICD-10-CM | POA: Diagnosis present

## 2018-10-19 DIAGNOSIS — N183 Chronic kidney disease, stage 3 unspecified: Secondary | ICD-10-CM | POA: Diagnosis present

## 2018-10-19 DIAGNOSIS — I5032 Chronic diastolic (congestive) heart failure: Secondary | ICD-10-CM | POA: Diagnosis present

## 2018-10-19 DIAGNOSIS — I6302 Cerebral infarction due to thrombosis of basilar artery: Secondary | ICD-10-CM

## 2018-10-19 DIAGNOSIS — I442 Atrioventricular block, complete: Secondary | ICD-10-CM | POA: Diagnosis present

## 2018-10-19 DIAGNOSIS — E1159 Type 2 diabetes mellitus with other circulatory complications: Secondary | ICD-10-CM | POA: Diagnosis not present

## 2018-10-19 DIAGNOSIS — I639 Cerebral infarction, unspecified: Secondary | ICD-10-CM | POA: Diagnosis not present

## 2018-10-19 DIAGNOSIS — J449 Chronic obstructive pulmonary disease, unspecified: Secondary | ICD-10-CM | POA: Diagnosis present

## 2018-10-19 DIAGNOSIS — I69398 Other sequelae of cerebral infarction: Secondary | ICD-10-CM | POA: Diagnosis not present

## 2018-10-19 DIAGNOSIS — Z8701 Personal history of pneumonia (recurrent): Secondary | ICD-10-CM

## 2018-10-19 DIAGNOSIS — Z6841 Body Mass Index (BMI) 40.0 and over, adult: Secondary | ICD-10-CM

## 2018-10-19 DIAGNOSIS — Z794 Long term (current) use of insulin: Secondary | ICD-10-CM

## 2018-10-19 DIAGNOSIS — R29810 Facial weakness: Secondary | ICD-10-CM | POA: Diagnosis not present

## 2018-10-19 DIAGNOSIS — H5500 Unspecified nystagmus: Secondary | ICD-10-CM | POA: Diagnosis present

## 2018-10-19 DIAGNOSIS — E1169 Type 2 diabetes mellitus with other specified complication: Secondary | ICD-10-CM | POA: Diagnosis not present

## 2018-10-19 DIAGNOSIS — R531 Weakness: Secondary | ICD-10-CM

## 2018-10-19 DIAGNOSIS — R260 Ataxic gait: Secondary | ICD-10-CM | POA: Diagnosis present

## 2018-10-19 DIAGNOSIS — I421 Obstructive hypertrophic cardiomyopathy: Secondary | ICD-10-CM | POA: Diagnosis present

## 2018-10-19 DIAGNOSIS — R8271 Bacteriuria: Secondary | ICD-10-CM | POA: Diagnosis not present

## 2018-10-19 DIAGNOSIS — I6329 Cerebral infarction due to unspecified occlusion or stenosis of other precerebral arteries: Secondary | ICD-10-CM | POA: Diagnosis not present

## 2018-10-19 DIAGNOSIS — E7521 Fabry (-Anderson) disease: Secondary | ICD-10-CM | POA: Diagnosis present

## 2018-10-19 DIAGNOSIS — E1122 Type 2 diabetes mellitus with diabetic chronic kidney disease: Secondary | ICD-10-CM | POA: Diagnosis present

## 2018-10-19 DIAGNOSIS — R29700 NIHSS score 0: Secondary | ICD-10-CM | POA: Diagnosis present

## 2018-10-19 DIAGNOSIS — G4733 Obstructive sleep apnea (adult) (pediatric): Secondary | ICD-10-CM | POA: Diagnosis present

## 2018-10-19 DIAGNOSIS — Z1159 Encounter for screening for other viral diseases: Secondary | ICD-10-CM | POA: Diagnosis not present

## 2018-10-19 DIAGNOSIS — I6381 Other cerebral infarction due to occlusion or stenosis of small artery: Principal | ICD-10-CM | POA: Diagnosis present

## 2018-10-19 DIAGNOSIS — I890 Lymphedema, not elsewhere classified: Secondary | ICD-10-CM | POA: Diagnosis present

## 2018-10-19 DIAGNOSIS — M109 Gout, unspecified: Secondary | ICD-10-CM | POA: Diagnosis present

## 2018-10-19 DIAGNOSIS — Z823 Family history of stroke: Secondary | ICD-10-CM

## 2018-10-19 DIAGNOSIS — Z950 Presence of cardiac pacemaker: Secondary | ICD-10-CM

## 2018-10-19 DIAGNOSIS — I69393 Ataxia following cerebral infarction: Secondary | ICD-10-CM | POA: Diagnosis not present

## 2018-10-19 DIAGNOSIS — R262 Difficulty in walking, not elsewhere classified: Secondary | ICD-10-CM | POA: Diagnosis present

## 2018-10-19 DIAGNOSIS — Z79899 Other long term (current) drug therapy: Secondary | ICD-10-CM

## 2018-10-19 DIAGNOSIS — B957 Other staphylococcus as the cause of diseases classified elsewhere: Secondary | ICD-10-CM | POA: Diagnosis present

## 2018-10-19 DIAGNOSIS — N390 Urinary tract infection, site not specified: Secondary | ICD-10-CM | POA: Diagnosis present

## 2018-10-19 DIAGNOSIS — R739 Hyperglycemia, unspecified: Secondary | ICD-10-CM | POA: Diagnosis not present

## 2018-10-19 DIAGNOSIS — N3000 Acute cystitis without hematuria: Secondary | ICD-10-CM | POA: Diagnosis not present

## 2018-10-19 DIAGNOSIS — M25462 Effusion, left knee: Secondary | ICD-10-CM | POA: Diagnosis not present

## 2018-10-19 DIAGNOSIS — Z66 Do not resuscitate: Secondary | ICD-10-CM | POA: Diagnosis present

## 2018-10-19 DIAGNOSIS — R2689 Other abnormalities of gait and mobility: Secondary | ICD-10-CM | POA: Diagnosis not present

## 2018-10-19 DIAGNOSIS — Z7982 Long term (current) use of aspirin: Secondary | ICD-10-CM

## 2018-10-19 DIAGNOSIS — I5030 Unspecified diastolic (congestive) heart failure: Secondary | ICD-10-CM | POA: Diagnosis not present

## 2018-10-19 DIAGNOSIS — N179 Acute kidney failure, unspecified: Secondary | ICD-10-CM | POA: Diagnosis present

## 2018-10-19 DIAGNOSIS — R200 Anesthesia of skin: Secondary | ICD-10-CM | POA: Diagnosis present

## 2018-10-19 DIAGNOSIS — E1165 Type 2 diabetes mellitus with hyperglycemia: Secondary | ICD-10-CM | POA: Diagnosis present

## 2018-10-19 DIAGNOSIS — R350 Frequency of micturition: Secondary | ICD-10-CM | POA: Diagnosis not present

## 2018-10-19 DIAGNOSIS — Z888 Allergy status to other drugs, medicaments and biological substances status: Secondary | ICD-10-CM

## 2018-10-19 DIAGNOSIS — E1151 Type 2 diabetes mellitus with diabetic peripheral angiopathy without gangrene: Secondary | ICD-10-CM | POA: Diagnosis present

## 2018-10-19 DIAGNOSIS — Z91013 Allergy to seafood: Secondary | ICD-10-CM

## 2018-10-19 DIAGNOSIS — R269 Unspecified abnormalities of gait and mobility: Secondary | ICD-10-CM

## 2018-10-19 DIAGNOSIS — R8281 Pyuria: Secondary | ICD-10-CM | POA: Diagnosis not present

## 2018-10-19 DIAGNOSIS — G463 Brain stem stroke syndrome: Secondary | ICD-10-CM | POA: Diagnosis not present

## 2018-10-19 DIAGNOSIS — I10 Essential (primary) hypertension: Secondary | ICD-10-CM | POA: Diagnosis not present

## 2018-10-19 DIAGNOSIS — E669 Obesity, unspecified: Secondary | ICD-10-CM | POA: Diagnosis not present

## 2018-10-19 LAB — HEMOGLOBIN A1C
Hgb A1c MFr Bld: 7.4 % — ABNORMAL HIGH (ref 4.8–5.6)
Mean Plasma Glucose: 165.68 mg/dL

## 2018-10-19 LAB — RAPID URINE DRUG SCREEN, HOSP PERFORMED
Amphetamines: NOT DETECTED
Barbiturates: NOT DETECTED
Benzodiazepines: NOT DETECTED
Cocaine: NOT DETECTED
Opiates: NOT DETECTED
Tetrahydrocannabinol: NOT DETECTED

## 2018-10-19 LAB — CBC WITH DIFFERENTIAL/PLATELET
Abs Immature Granulocytes: 0.02 10*3/uL (ref 0.00–0.07)
Basophils Absolute: 0 10*3/uL (ref 0.0–0.1)
Basophils Relative: 1 %
Eosinophils Absolute: 0.2 10*3/uL (ref 0.0–0.5)
Eosinophils Relative: 2 %
HCT: 48.9 % (ref 39.0–52.0)
Hemoglobin: 15.9 g/dL (ref 13.0–17.0)
Immature Granulocytes: 0 %
Lymphocytes Relative: 26 %
Lymphs Abs: 1.9 10*3/uL (ref 0.7–4.0)
MCH: 29.6 pg (ref 26.0–34.0)
MCHC: 32.5 g/dL (ref 30.0–36.0)
MCV: 91.1 fL (ref 80.0–100.0)
Monocytes Absolute: 0.7 10*3/uL (ref 0.1–1.0)
Monocytes Relative: 10 %
Neutro Abs: 4.4 10*3/uL (ref 1.7–7.7)
Neutrophils Relative %: 61 %
Platelets: 229 10*3/uL (ref 150–400)
RBC: 5.37 MIL/uL (ref 4.22–5.81)
RDW: 13.3 % (ref 11.5–15.5)
WBC: 7.2 10*3/uL (ref 4.0–10.5)
nRBC: 0 % (ref 0.0–0.2)

## 2018-10-19 LAB — BASIC METABOLIC PANEL
Anion gap: 14 (ref 5–15)
BUN: 28 mg/dL — ABNORMAL HIGH (ref 6–20)
CO2: 25 mmol/L (ref 22–32)
Calcium: 9.8 mg/dL (ref 8.9–10.3)
Chloride: 105 mmol/L (ref 98–111)
Creatinine, Ser: 2.1 mg/dL — ABNORMAL HIGH (ref 0.61–1.24)
GFR calc Af Amer: 39 mL/min — ABNORMAL LOW (ref >60)
GFR calc non Af Amer: 34 mL/min — ABNORMAL LOW (ref >60)
Glucose, Bld: 130 mg/dL — ABNORMAL HIGH (ref 70–99)
Potassium: 3.4 mmol/L — ABNORMAL LOW (ref 3.5–5.1)
Sodium: 144 mmol/L (ref 135–145)

## 2018-10-19 LAB — LIPID PANEL
Cholesterol: 222 mg/dL — ABNORMAL HIGH (ref 0–200)
HDL: 53 mg/dL (ref >40)
LDL Cholesterol: 146 mg/dL — ABNORMAL HIGH (ref 0–99)
Total CHOL/HDL Ratio: 4.2 RATIO
Triglycerides: 117 mg/dL (ref <150)
VLDL: 23 mg/dL (ref 0–40)

## 2018-10-19 LAB — URINALYSIS, ROUTINE W REFLEX MICROSCOPIC
Bilirubin Urine: NEGATIVE
Glucose, UA: NEGATIVE mg/dL
Hgb urine dipstick: NEGATIVE
Ketones, ur: NEGATIVE mg/dL
Nitrite: POSITIVE — AB
Protein, ur: 100 mg/dL — AB
Specific Gravity, Urine: 1.009 (ref 1.005–1.030)
WBC, UA: >50 WBC/hpf — ABNORMAL HIGH (ref 0–5)
pH: 6 (ref 5.0–8.0)

## 2018-10-19 LAB — IRON AND TIBC
Iron: 93 ug/dL (ref 45–182)
Saturation Ratios: 29 % (ref 17.9–39.5)
TIBC: 322 ug/dL (ref 250–450)
UIBC: 229 ug/dL

## 2018-10-19 LAB — CBG MONITORING, ED: Glucose-Capillary: 122 mg/dL — ABNORMAL HIGH (ref 70–99)

## 2018-10-19 LAB — RETICULOCYTES
Immature Retic Fract: 11.7 % (ref 2.3–15.9)
RBC.: 5.24 MIL/uL (ref 4.22–5.81)
Retic Count, Absolute: 89.1 10*3/uL (ref 19.0–186.0)
Retic Ct Pct: 1.7 % (ref 0.4–3.1)

## 2018-10-19 LAB — SARS CORONAVIRUS 2 BY RT PCR (HOSPITAL ORDER, PERFORMED IN ~~LOC~~ HOSPITAL LAB): SARS Coronavirus 2: NEGATIVE

## 2018-10-19 LAB — GLUCOSE, CAPILLARY
Glucose-Capillary: 139 mg/dL — ABNORMAL HIGH (ref 70–99)
Glucose-Capillary: 131 mg/dL — ABNORMAL HIGH (ref 70–99)
Glucose-Capillary: 164 mg/dL — ABNORMAL HIGH (ref 70–99)

## 2018-10-19 LAB — TROPONIN I: Troponin I: 0.21 ng/mL (ref <0.03)

## 2018-10-19 LAB — VITAMIN B12: Vitamin B-12: 367 pg/mL (ref 180–914)

## 2018-10-19 LAB — FERRITIN: Ferritin: 105 ng/mL (ref 24–336)

## 2018-10-19 LAB — FOLATE: Folate: 17.5 ng/mL (ref >5.9)

## 2018-10-19 MED ORDER — SODIUM CHLORIDE 0.9 % IV SOLN: 115.0000 mg | INTRAVENOUS | Status: DC

## 2018-10-19 MED ORDER — AMLODIPINE BESYLATE 10 MG PO TABS
10.0000 mg | ORAL_TABLET | Freq: Every day | ORAL | Status: DC
  Administered 2018-10-19 – 2018-10-20 (×2): 10 mg via ORAL
  Filled 2018-10-19 (×2): qty 1

## 2018-10-19 MED ORDER — SODIUM CHLORIDE 0.9 % IV SOLN: INTRAVENOUS | Status: DC

## 2018-10-19 MED ORDER — MECLIZINE HCL 25 MG PO TABS
12.5000 mg | ORAL_TABLET | Freq: Once | ORAL | Status: AC
  Administered 2018-10-19: 12.5 mg via ORAL
  Filled 2018-10-19: qty 1

## 2018-10-19 MED ORDER — SODIUM CHLORIDE 0.9 % IV SOLN
1.0000 g | Freq: Once | INTRAVENOUS | Status: AC
  Administered 2018-10-19: 1 g via INTRAVENOUS
  Filled 2018-10-19: qty 10

## 2018-10-19 MED ORDER — STROKE: EARLY STAGES OF RECOVERY BOOK
Freq: Once | Status: DC
  Filled 2018-10-19: qty 1

## 2018-10-19 MED ORDER — INSULIN ASPART PROT & ASPART (70-30 MIX) 100 UNIT/ML ~~LOC~~ SUSP
20.0000 [IU] | Freq: Two times a day (BID) | SUBCUTANEOUS | Status: DC
  Administered 2018-10-19 – 2018-10-20 (×2): 20 [IU] via SUBCUTANEOUS
  Filled 2018-10-19: qty 10

## 2018-10-19 MED ORDER — ONDANSETRON HCL 4 MG/2ML IJ SOLN: 4.0000 mg | Freq: Four times a day (QID) | INTRAMUSCULAR | Status: DC | PRN

## 2018-10-19 MED ORDER — ENOXAPARIN SODIUM 40 MG/0.4ML ~~LOC~~ SOLN
40.0000 mg | SUBCUTANEOUS | Status: DC
  Administered 2018-10-19 – 2018-10-23 (×5): 40 mg via SUBCUTANEOUS
  Filled 2018-10-19 (×5): qty 0.4

## 2018-10-19 MED ORDER — HYDROCHLOROTHIAZIDE 12.5 MG PO CAPS
12.5000 mg | ORAL_CAPSULE | Freq: Every day | ORAL | Status: DC
  Administered 2018-10-19: 12.5 mg via ORAL
  Filled 2018-10-19 (×2): qty 1

## 2018-10-19 MED ORDER — SODIUM CHLORIDE 0.9 % IV SOLN
1.0000 g | INTRAVENOUS | Status: DC
  Administered 2018-10-20: 1 g via INTRAVENOUS
  Filled 2018-10-19: qty 10

## 2018-10-19 MED ORDER — ONDANSETRON 4 MG PO TBDP
4.0000 mg | ORAL_TABLET | Freq: Four times a day (QID) | ORAL | Status: DC | PRN
  Administered 2018-10-19: 4 mg via ORAL
  Filled 2018-10-19: qty 1

## 2018-10-19 MED ORDER — LOSARTAN POTASSIUM 50 MG PO TABS
50.0000 mg | ORAL_TABLET | Freq: Every day | ORAL | Status: DC
  Administered 2018-10-19: 50 mg via ORAL
  Filled 2018-10-19: qty 1

## 2018-10-19 MED ORDER — ALLOPURINOL 100 MG PO TABS
300.0000 mg | ORAL_TABLET | Freq: Every day | ORAL | Status: DC
  Administered 2018-10-19 – 2018-10-23 (×5): 300 mg via ORAL
  Filled 2018-10-19 (×5): qty 3

## 2018-10-19 MED ORDER — ASPIRIN EC 81 MG PO TBEC
81.0000 mg | DELAYED_RELEASE_TABLET | Freq: Every day | ORAL | Status: DC
  Administered 2018-10-19 – 2018-10-23 (×5): 81 mg via ORAL
  Filled 2018-10-19 (×5): qty 1

## 2018-10-19 MED ORDER — ACETAMINOPHEN 325 MG PO TABS: 650.0000 mg | ORAL_TABLET | Freq: Four times a day (QID) | ORAL | Status: DC | PRN

## 2018-10-19 MED ORDER — ACETAMINOPHEN 650 MG RE SUPP: 650.0000 mg | Freq: Four times a day (QID) | RECTAL | Status: DC | PRN

## 2018-10-19 MED ORDER — FUROSEMIDE 40 MG PO TABS
40.0000 mg | ORAL_TABLET | Freq: Every day | ORAL | Status: DC
  Administered 2018-10-19: 40 mg via ORAL
  Filled 2018-10-19: qty 1

## 2018-10-19 MED ORDER — LOSARTAN POTASSIUM-HCTZ 50-12.5 MG PO TABS: 1.0000 | ORAL_TABLET | Freq: Every day | ORAL | Status: DC

## 2018-10-19 NOTE — ED Notes (Signed)
Dr pickering aware of trop

## 2018-10-19 NOTE — ED Notes (Signed)
Attempted IV x 2 w/o success pt [id number]

## 2018-10-19 NOTE — Progress Notes (Signed)
Orthopedic Tech Progress Note Patient Details:  [name] 1960-11-07 [id number]  Ortho Devices Type of Ortho Device: Louretta Parma boot Ortho Device/Splint Location: bilateral Ortho Device/Splint Interventions: Adjustment, Application, Ordered   Post Interventions Patient Tolerated: Well Instructions Provided: Care of device, Adjustment of device   [name] 10/19/2018, 2:57 PM

## 2018-10-19 NOTE — ED Provider Notes (Addendum)
[facility] EMERGENCY DEPARTMENT Provider Note   CSN: [id number] Arrival date & time: 10/19/18  0556    History   Chief Complaint Chief Complaint  Patient presents with   Weakness    HPI [name] is a 58 y.o. male.     The history is provided by the patient.  Weakness  Severity:  Moderate Onset quality:  Gradual Duration:  2 days Timing:  Constant Progression:  Unchanged Chronicity:  New Context comment:  Refrigerator broke and he had to put his medicines in a cooler and thought he had food poisoning, feels like he was in a "[employer] fight" Relieved by:  Nothing Worsened by:  Nothing Ineffective treatments:  None tried Associated symptoms: difficulty walking   Associated symptoms: no abdominal pain, no anorexia, no aphasia, no arthralgias, no chest pain, no cough, no diarrhea, no dizziness, no drooling, no dysphagia, no dysuria, no numbness in extremities, no falls, no fever, no foul-smelling urine, no frequency, no headaches, no hematochezia, no lethargy, no loss of consciousness, no melena, no myalgias, no nausea, no near-syncope, no seizures, no sensory-motor deficit, no shortness of breath, no stroke symptoms, no syncope, no urgency, no vision change and no vomiting   Risk factors: no anemia   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  He states he came in because symptoms did not improve.  He denies or that the room is spinning to me.  He says he feels like he is going to fall when he walks.  Head movement does not affect symptoms, only standing and attempting to walk. No changes in vision or speech.  No f/c/r.  No cough, no SOB no CP.     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 right worse   History of cellulitis of skin with lymphangitis LEFT LEG   HOCM (hypertrophic obstructive cardiomyopathy) ([place])    a. Echo 11/16: Severe LVH, EF 55-60%, abnormal GLS consistent with HOCM, no SAM, mild LAE   Hypertension 20 years   Lymphedema of lower extremity LEFT  >  RIGHT   "using ankle high socks at home; pump doesn't work for me" (07/23/2014)   Pacemaker    02/12/11   Pneumonia 08/2011   Seasonal asthma NO INHALERS   30 years   Second degree Mobitz II AV block    with syncope, s/p PPM   Short of breath on exertion    Type II diabetes mellitus ([place]) 2012   INSULIN DEPENDENT    Patient Active Problem List   Diagnosis Date Noted   Complete heart block ([place]) 08/01/2018   Upper airway cough syndrome 08/01/2018   Lymphedema 10/06/2016   Gout, arthritis    Anorectal fistula 06/23/2015  Onychomycosis of toenail 09/26/2014   Diabetes mellitus type 2 in obese [facility])    Iron deficiency anemia 07/29/2014   CKD (chronic kidney disease), stage III (HCC)    Chronic diastolic HF (heart failure) ([place]) 03/27/2013   Peripheral edema 07/20/2012   Obstructive sleep apnea, suspected 06/21/2012   Pacemaker-[employer] 02/23/2012   Hypertension 08/09/2011   COPD with asthma ([place]) 08/09/2011   Mobitz type II atrioventricular block 05/19/2011   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[place]) 05/19/2011    Past Surgical History:  Procedure Laterality Date   CARDIAC CATHETERIZATION  03-12-2011  DR Daneen Schick   HYPERTROPHIC CARDIOMYOPATHY WITH LV  CAVITY  APPEARANCE CONSISTANT WITH SIGNIFICANT APICAL HYPERTROPHY/ NORMAL LVSF / EF 55%/ EVIDENCE OF DIASTOLIC DYSFUNCTION WITH EDP OF 23-24mmHg AFTER A-WAVE/ NORMAL CORONARY ARTERIES   EVALUATION UNDER ANESTHESIA WITH FISTULECTOMY N/A 06/23/2015   Procedure: EXAM UNDER ANESTHESIA WITH POSSIBLE FISTULOTOMY;  Surgeon: Judeth Horn, MD;  Location: [place];  Service: General;  Laterality: N/A;   HERNIA REPAIR  [id number]   Umbilical Hernia Repair   INCISION AND DRAINAGE PERIRECTAL ABSCESS Left 07/29/2014   Procedure: IRRIGATION AND DEBRIDEMENT PERIRECTAL ABSCESS;  Surgeon: Doreen Salvage, MD;  Location: [place];  Service: General;  Laterality: Left;  Prone position   INSERT / REPLACE / REMOVE PACEMAKER  02/12/2011   SJM implanted by Dr Rayann Heman for Mobitz II second degree AV block and syncope   IRRIGATION AND DEBRIDEMENT ABSCESS N/A 07/27/2013   Procedure: IRRIGATION AND DEBRIDEMENT OF SKIN, SOFT TISSUE AND MUSCLES OF UPPER BACK (11X19X4cm) WITH 10 BLADE AND PULSATILE LAVAGE ;  Surgeon: Gayland Curry, MD;  Location: [place];  Service: General;  Laterality: N/A;   UMBILICAL HERNIA REPAIR  03/22/2012   Procedure: HERNIA REPAIR UMBILICAL ADULT;  Surgeon: Leighton Ruff, MD;  Location: [facility];  Service: General;  Laterality: N/A;  Umbilical Hernia Repair         Home Medications    Prior to Admission medications   Medication Sig Start Date End Date Taking? Authorizing Provider  ACCU-CHEK AVIVA PLUS test strip TEST 3 TIMES DAILY. ICD 10 E11.8 07/27/16   Boykin Nearing, MD  ACCU-CHEK FASTCLIX LANCETS MISC Use to test blood sugar 3 to 4 times daily. 01/02/18   Ladell Pier, MD  acetaminophen (TYLENOL) 500 MG tablet Take 500 mg by mouth every 14 (fourteen) days. Only taking 1 hour before infusion.  AS premed for [employer]razyme    [provider]  Agalsidase beta (FABRAZYME IV) Inject 115 mg into the vein every 14 (fourteen) days.     [provider]  albuterol (PROAIR HFA) 108 (90 Base) MCG/ACT inhaler  Inhale 1 puff into the lungs every 6 (six) hours as needed for wheezing or shortness of breath. 05/29/18   Elsie Stain, MD  allopurinol (ZYLOPRIM) 100 MG tablet Take 3 tablets (300 mg total) by mouth daily. 05/29/18   Elsie Stain, MD  amLODipine (NORVASC) 10 MG tablet Take 1 tablet (10 mg total) by mouth daily. 08/01/18   Ladell Pier, MD  aspirin 81 MG tablet Take 81 mg by mouth daily.     [provider]  atorvastatin (LIPITOR) 20 MG tablet Take 1 tablet (20 mg total) by mouth daily. 05/29/18   Elsie Stain, MD  fexofenadine (ALLEGRA) 180 MG tablet Take 1 tablet (180 mg total) by mouth daily. 08/01/18   Ladell Pier, MD  fluticasone (FLONASE) 50 MCG/ACT nasal spray Place 1 spray into both nostrils daily. 05/12/17   Wynetta Emery,  Dalbert Batman, MD  furosemide (LASIX) 20 MG tablet Take 2 tablets (40 mg total) by mouth daily. Lower extremiity edema 05/29/18   Elsie Stain, MD  hydrocortisone valerate cream (WESTCORT) 0.2 % Apply to affected area daily PRN 08/11/17   Ladell Pier, MD  Insulin Lispro Prot & Lispro (HUMALOG 75/25 MIX) (75-25) 100 UNIT/ML Kwikpen INJECT 20 UNITS INTO THE SKIN 2 TIMES DAILY 08/14/18   Ladell Pier, MD  Insulin Pen Needle ([place] SHORTLENGTH PEN NEEDLES) 31G X 8 MM MISC 1 each by Does not apply route 2 (two) times daily before a meal. 05/29/18   Elsie Stain, MD  losartan-hydrochlorothiazide (HYZAAR) 50-12.5 MG tablet Take 1 tablet by mouth daily. 05/29/18   Elsie Stain, MD  OLIVE LEAF PO Take 1 tablet by mouth 2 (two) times daily.     [provider]    Family History Family History  Problem Relation Age of Onset   Stroke Mother    Colon cancer Neg Hx     Social History Social History   Tobacco Use   Smoking status: Never Smoker   Smokeless tobacco: Never Used  Substance Use Topics   Alcohol use: Yes    Comment: 07/23/2014 "might have a few drinks on holidays or at cookouts"   Drug use: No      Allergies   Shellfish allergy and Lisinopril   Review of Systems Review of Systems  Constitutional: Negative for fever.  HENT: Negative for drooling.   Eyes: Negative for visual disturbance.  Respiratory: Negative for cough and shortness of breath.   Cardiovascular: Negative for chest pain, syncope and near-syncope.  Gastrointestinal: Negative for abdominal pain, anorexia, diarrhea, dysphagia, hematochezia, melena, nausea and vomiting.  Genitourinary: Negative for dysuria, frequency and urgency.  Musculoskeletal: Negative for arthralgias, falls and myalgias.  Neurological: Positive for weakness. Negative for dizziness, seizures, loss of consciousness, syncope, speech difficulty, numbness and headaches.  All other systems reviewed and are negative.    Physical Exam Updated Vital Signs BP (!) 155/90 (BP Location: Right Arm)    Pulse 63    Temp 98 F (36.7 C) (Oral)    Resp 14    Ht 5\' 10"  (1.778 m)    Wt 132.9 kg    SpO2 98%    BMI 42.04 kg/m   Physical Exam Vitals signs and nursing note reviewed.  Constitutional:      Appearance: He is obese. He is not ill-appearing.  HENT:     Head: Normocephalic and atraumatic.     Nose: Nose normal.  Eyes:     Extraocular Movements: Extraocular movements intact.     Conjunctiva/sclera: Conjunctivae normal.     Pupils: Pupils are equal, round, and reactive to light.  Neck:     Musculoskeletal: Normal range of motion and neck supple.  Cardiovascular:     Rate and Rhythm: Normal rate and regular rhythm.     Pulses: Normal pulses.     Heart sounds: Normal heart sounds.  Pulmonary:     Effort: Pulmonary effort is normal.     Breath sounds: Normal breath sounds.  Abdominal:     General: Abdomen is flat. Bowel sounds are normal.     Tenderness: There is no abdominal tenderness. There is no guarding or rebound.  Musculoskeletal: Normal range of motion.        General: No tenderness.  Skin:    General: Skin is warm and dry.      Capillary Refill: Capillary  refill takes less than 2 seconds.  Neurological:     General: No focal deficit present.     Mental Status: He is alert and oriented to person, place, and time.     Deep Tendon Reflexes: Reflexes normal.  Psychiatric:        Mood and Affect: Mood normal.        Behavior: Behavior normal.      ED Treatments / Results  Labs (all labs ordered are listed, but only abnormal results are displayed)  Pending labs   EKG  EKG Interpretation  Date/Time:  Thursday Oct 19 2018 06:42:17 EDT Ventricular Rate:  68 PR Interval:    QRS Duration: 181 QT Interval:  494 QTC Calculation: 526 R Axis:   -79 Text Interpretation:  A-V dual-paced rhythm with some inhibition No further analysis attempted due to paced rhythm Confirmed by Randal Buba, Ryan Palermo ([id number]) on 10/19/2018 6:46:12 AM       Radiology Results for orders placed or performed during the hospital encounter of 10/19/18  POC CBG, ED  Result Value Ref Range   Glucose-Capillary 122 (H) 70 - 99 mg/dL   Dg Chest 2 View  Result Date: 10/19/2018 CLINICAL DATA:  58 year old male with weakness for 2 days. EXAM: CHEST - 2 VIEW COMPARISON:  Portable chest 07/29/2014 and earlier. FINDINGS: Stable left chest dual lead cardiac pacemaker. Stable mild cardiomegaly. Other mediastinal contours are within normal limits. Visualized tracheal air column is within normal limits. Mildly lower lung volumes compared to 2016. No pneumothorax, pulmonary edema, pleural effusion or confluent pulmonary opacity. No acute osseous abnormality identified. Negative visible bowel gas pattern. IMPRESSION: No acute cardiopulmonary abnormality. Electronically Signed   By: Genevie Ann M.D.   On: 10/19/2018 06:53   Ct Head Wo Contrast  Result Date: 10/19/2018 CLINICAL DATA:  58 year old male with weakness for 2 days. Unsteady on feet. EXAM: CT HEAD WITHOUT CONTRAST TECHNIQUE: Contiguous axial images were obtained from the base of the skull through the vertex  without intravenous contrast. COMPARISON:  Head CT without contrast 02/11/2011. FINDINGS: Brain: Cerebral volume remains within normal limits. No midline shift, ventriculomegaly, mass effect, evidence of mass lesion, intracranial hemorrhage or evidence of cortically based acute infarction. New hypodensity in the left paracentral pons since 2012. Patchy bilateral periventricular white matter hypodensity has increased. No cortical encephalomalacia identified. Vascular: Calcified atherosclerosis at the skull base. Chronic intracranial artery dolichoectasia with prominent calcification of the basilar artery now (series 3, image 14). No suspicious intracranial vascular hyperdensity. Skull: No acute osseous abnormality identified. Sinuses/Orbits: Paranasal sinuses and mastoids are stable and well pneumatized. Other: Stable orbit and scalp soft tissues. IMPRESSION: 1. Chronic intracranial artery dolichoectasia with progressed calcified atherosclerosis since 2012. A lacune or infarct of the left pons is new since 2012 but probably chronic. Still, consider Acute Small Vessel Ischemia in this clinical setting. 2. No intracranial hemorrhage or cortically based infarct identified. Electronically Signed   By: Genevie Ann M.D.   On: 10/19/2018 06:57    Procedures Procedures (including critical care time)  Medications Ordered in ED Medications  meclizine (ANTIVERT) tablet 12.5 mg (has no administration in time range)    Passed swallow screen, failed ambulation.  Will not be able to have MRI secondary to pacemaker   Neuro hospitalist consult   Final Clinical Impressions(s) / ED Diagnoses   Signed out to Dr. Alvino Chapel.  Will need admission he he cannot walk with equivocal head CT in the setting of Fabry's disease .     Leavy Heatherly,  Cailah Reach, MD 10/19/18 [id number]    Randal Buba, Kailey Esquilin, MD 10/19/18 8466

## 2018-10-19 NOTE — Evaluation (Signed)
Occupational Therapy Evaluation Patient Details Name: [name] MRN: [id number] DOB: [birth year] Today's Date: 10/19/2018    History of Present Illness Pt is a 58 y/o male admitted secondary to gait instability. CT of the brain was negative for any acute findings. MRI unable to be obtained due to pacemaker. Further Neuro work-up pending. PMH including but not limited to CHF, DM, HTN, CKD and Fabry's disease.   Clinical Impression   This 58 y/o male presents with the above. PTA pt reports he was living alone, performing ADL, iADL and functional mobility independently. Pt currently requiring minA for short distance mobility in room using RW. Pt with consistent L lateral lean and often with mild LOB with mobility and standing ADL tasks requiring minA to correct. Pt requiring minA for standing grooming and seated UB ADL, min-modA for LB ADL. Pt also with reports of diplopia since admission. He will benefit from continued acute OT services and recommend follow up therapy services at CIR level to maximize his safety and independence with ADL and mobility. Will follow.     Follow Up Recommendations  CIR;Supervision/Assistance - 24 hour    Equipment Recommendations  3 in 1 bedside commode;Other (comment)(TBA)           Precautions / Restrictions Precautions Precautions: Fall Precaution Comments: consistent L lean in standing and with ambulation Restrictions Weight Bearing Restrictions: No      Mobility Bed Mobility Overal bed mobility: Needs Assistance Bed Mobility: Supine to Sit;Sit to Supine     Supine to sit: HOB elevated;Min guard Sit to supine: Min guard   General bed mobility comments: increased time and effort, min guard for safety  Transfers Overall transfer level: Needs assistance Equipment used: Rolling walker (2 wheeled) Transfers: Sit to/from Stand Sit to Stand: Min assist         General transfer comment: cueing for safe hand placement, assistance needed  for stability with transition into standing    Balance Overall balance assessment: Needs assistance Sitting-balance support: Feet supported Sitting balance-Leahy Scale: Good     Standing balance support: During functional activity;Bilateral upper extremity supported Standing balance-Leahy Scale: Poor Standing balance comment: L lateral lean; standing balance activity of lateral weight shifting with cueing and min A                           ADL either performed or assessed with clinical judgement   ADL Overall ADL's : Needs assistance/impaired Eating/Feeding: Modified independent;Sitting   Grooming: Wash/dry face;Minimal assistance;Standing Grooming Details (indicate cue type and reason): minA for balance Upper Body Bathing: Minimal assistance;Sitting   Lower Body Bathing: Minimal assistance;Moderate assistance;Sit to/from stand   Upper Body Dressing : Minimal assistance;Sitting   Lower Body Dressing: Moderate assistance;Sit to/from stand   Toilet Transfer: Minimal assistance;Ambulation;RW Toilet Transfer Details (indicate cue type and reason): simulated via transfer to/from EOB Toileting- Clothing Manipulation and Hygiene: Moderate assistance;Sit to/from stand       Functional mobility during ADLs: Minimal assistance;Rolling walker General ADL Comments: pt with decreased balance, weakness, reports of double vision     Vision Patient Visual Report: Diplopia Vision Assessment?: Yes Eye Alignment: Within Functional Limits Ocular Range of Motion: Within Functional Limits Alignment/Gaze Preference: Within Defined Limits Diplopia Assessment: Objects split side to side;Present in far gaze(increased presence with far gaze)     Perception     Praxis      Pertinent Vitals/Pain Pain Assessment: No/denies pain     Hand Dominance  Extremity/Trunk Assessment Upper Extremity Assessment Upper Extremity Assessment: Overall WFL for tasks assessed   Lower  Extremity Assessment Lower Extremity Assessment: Defer to PT evaluation       Communication Communication Communication: No difficulties   Cognition Arousal/Alertness: Awake/alert Behavior During Therapy: WFL for tasks assessed/performed Overall Cognitive Status: Impaired/Different from baseline Area of Impairment: Problem solving;Safety/judgement                         Safety/Judgement: Decreased awareness of deficits   Problem Solving: Difficulty sequencing;Requires verbal cues     General Comments       Exercises     Shoulder Instructions      Home Living Family/patient expects to be discharged to:: Private residence Living Arrangements: Alone Available Help at Discharge: Friend(s);Available PRN/intermittently Type of Home: House Home Access: Stairs to enter [employer] of Steps: 2 Entrance Stairs-Rails: Right;Left;Can reach both Home Layout: One level     Bathroom Shower/Tub: Occupational psychologist: Standard     Home Equipment: Environmental consultant - 2 wheels          Prior Functioning/Environment Level of Independence: Independent        Comments: driving, retired        OT Problem List: Decreased strength;Decreased range of motion;Decreased activity tolerance;Impaired balance (sitting and/or standing);Impaired vision/perception;Decreased safety awareness;Decreased knowledge of use of DME or AE;Obesity      OT Treatment/Interventions: Self-care/ADL training;Therapeutic exercise;Neuromuscular education;DME and/or AE instruction;Therapeutic activities;Cognitive remediation/compensation;Visual/perceptual remediation/compensation;Patient/family education;Balance training    OT Goals(Current goals can be found in the care plan section) Acute Rehab OT Goals Patient Stated Goal: work on balance OT Goal Formulation: With patient Time For Goal Achievement: 11/02/18 Potential to Achieve Goals: Good ADL Goals Pt Will Perform Grooming:  with modified independence;standing Pt Will Perform Lower Body Bathing: with modified independence;sit to/from stand Pt Will Perform Lower Body Dressing: with modified independence;sit to/from stand Pt Will Transfer to Toilet: with modified independence;ambulating Pt Will Perform Toileting - Clothing Manipulation and hygiene: with modified independence;sit to/from stand Pt/caregiver will Perform Home Exercise Program: Increased strength;Both right and left upper extremity;With written HEP provided Additional ADL Goal #1: Pt will independently demonstrate visual compensatory strategies during functional task.  OT Frequency: Min 2X/week   Barriers to D/C:            Co-evaluation              AM-PAC OT "6 Clicks" Daily Activity     Outcome Measure Help from another person eating meals?: None Help from another person taking care of personal grooming?: A Little Help from another person toileting, which includes using toliet, bedpan, or urinal?: A Lot Help from another person bathing (including washing, rinsing, drying)?: A Lot Help from another person to put on and taking off regular upper body clothing?: A Little Help from another person to put on and taking off regular lower body clothing?: A Lot 6 Click Score: 16   End of Session Equipment Utilized During Treatment: Rolling walker Nurse Communication: Mobility status  Activity Tolerance: Patient tolerated treatment well Patient left: in bed;with call bell/phone within reach;with bed alarm set  OT Visit Diagnosis: Other abnormalities of gait and mobility (R26.89);Unsteadiness on feet (R26.81)                Time: 9678-9381 OT Time Calculation (min): 19 min Charges:  OT General Charges $OT Visit: 1 Visit OT Evaluation $OT Eval Moderate Complexity: 1 Mod  Lou Cal,  Craig Pager [phone] Office [place] 10/19/2018, 4:08 PM

## 2018-10-19 NOTE — Evaluation (Signed)
Physical Therapy Evaluation Patient Details Name: [name] MRN: [id number] DOB: 05-26-1961 Today's Date: 10/19/2018   History of Present Illness  Pt is a 58 y/o male admitted secondary to gait instability. CT of the brain was negative for any acute findings. MRI unable to be obtained due to pacemaker. Further Neuro work-up pending. PMH including but not limited to CHF, DM, HTN, CKD and Fabry's disease.    Clinical Impression  Pt presented supine in bed with HOB elevated, awake and willing to participate in therapy session. Prior to admission, pt reported that he was independent with all functional mobility and ADLs. Pt lives in a single level home with two steps to enter. At the time of evaluation, pt required min A for transfers and mod A to ambulate a short distance with RW in his room. Pt with consistent significant L lateral lean with standing and ambulation, requiring mod A to maintain balance. Pt would continue to benefit from skilled physical therapy services at this time while admitted and after d/c to address the below listed limitations in order to improve overall safety and independence with functional mobility.     Follow Up Recommendations CIR;Supervision/Assistance - 24 hour    Equipment Recommendations  None recommended by PT    Recommendations for Other Services       Precautions / Restrictions Precautions Precautions: Fall Precaution Comments: consistent L lean in standing and with ambulation Restrictions Weight Bearing Restrictions: No      Mobility  Bed Mobility Overal bed mobility: Needs Assistance Bed Mobility: Supine to Sit;Sit to Supine     Supine to sit: HOB elevated;Min guard Sit to supine: Min guard   General bed mobility comments: increased time and effort, min guard for safety  Transfers Overall transfer level: Needs assistance Equipment used: Rolling walker (2 wheeled) Transfers: Sit to/from Stand Sit to Stand: Min assist          General transfer comment: cueing for safe hand placement, assistance needed for stability with transition into standing  Ambulation/Gait Ambulation/Gait assistance: Mod assist Gait Distance (Feet): 20 Feet Assistive device: Rolling walker (2 wheeled) Gait Pattern/deviations: Step-to pattern;Decreased step length - right;Decreased step length - left;Decreased stride length;Decreased weight shift to right;Decreased stance time - right;Ataxic Gait velocity: decreased   General Gait Details: pt with modest instability and significant L lateral lean and LOB laterally towards L; required consistant mod A to maintain upright standing position; pt also with bias towards L with more weight shift towards his L side and decreased weight shift/stance time on R  Stairs            Wheelchair Mobility    Modified Rankin (Stroke Patients Only)       Balance Overall balance assessment: Needs assistance Sitting-balance support: Feet supported Sitting balance-Leahy Scale: Good     Standing balance support: During functional activity;Bilateral upper extremity supported Standing balance-Leahy Scale: Poor Standing balance comment: L lateral lean; standing balance activity of lateral weight shifting with cueing and min A                             Pertinent Vitals/Pain Pain Assessment: No/denies pain    Home Living Family/patient expects to be discharged to:: Private residence Living Arrangements: Alone Available Help at Discharge: Friend(s);Available PRN/intermittently Type of Home: House Home Access: Stairs to enter Entrance Stairs-Rails: Right;Left;Can reach both Entrance Stairs-Number of Steps: 2 Home Layout: One level Home Equipment: Walker - 2 wheels  Prior Function Level of Independence: Independent         Comments: driving, retired     Journalist, newspaper        Extremity/Trunk Assessment   Upper Extremity Assessment Upper Extremity Assessment: Defer  to OT evaluation;Overall [facility] for tasks assessed    Lower Extremity Assessment Lower Extremity Assessment: Overall WFL for tasks assessed       Communication   Communication: No difficulties  Cognition Arousal/Alertness: Awake/alert Behavior During Therapy: WFL for tasks assessed/performed Overall Cognitive Status: Impaired/Different from baseline Area of Impairment: Problem solving;Safety/judgement                         Safety/Judgement: Decreased awareness of deficits   Problem Solving: Difficulty sequencing;Requires verbal cues        General Comments      Exercises     Assessment/Plan    PT Assessment Patient needs continued PT services  PT Problem List Decreased balance;Decreased mobility;Decreased coordination;Decreased knowledge of use of DME;Decreased safety awareness;Decreased knowledge of precautions       PT Treatment Interventions DME instruction;Stair training;Gait training;Functional mobility training;Therapeutic activities;Therapeutic exercise;Balance training;Neuromuscular re-education;Patient/family education    PT Goals (Current goals can be found in the Care Plan section)  Acute Rehab PT Goals Patient Stated Goal: work on balance PT Goal Formulation: With patient Time For Goal Achievement: 11/02/18 Potential to Achieve Goals: Good    Frequency Min 5X/week   Barriers to discharge        Co-evaluation               AM-PAC PT "6 Clicks" Mobility  Outcome Measure Help needed turning from your back to your side while in a flat bed without using bedrails?: None Help needed moving from lying on your back to sitting on the side of a flat bed without using bedrails?: None Help needed moving to and from a bed to a chair (including a wheelchair)?: A Lot Help needed standing up from a chair using your arms (e.g., wheelchair or bedside chair)?: A Lot Help needed to walk in hospital room?: A Lot Help needed climbing 3-5 steps with a  railing? : Total 6 Click Score: 15    End of Session Equipment Utilized During Treatment: Gait belt Activity Tolerance: Patient limited by fatigue Patient left: in bed;with call bell/phone within reach;with bed alarm set Nurse Communication: Mobility status PT Visit Diagnosis: Other abnormalities of gait and mobility (R26.89);Ataxic gait (R26.0)    Time: 2694-8546 PT Time Calculation (min) (ACUTE ONLY): 23 min   Charges:   PT Evaluation $PT Eval Moderate Complexity: 1 Mod PT Treatments $Gait Training: 8-22 mins        Sherie Don, [place], DPT  Acute Rehabilitation Services Pager [phone] Office [place] 10/19/2018, 3:53 PM

## 2018-10-19 NOTE — ED Notes (Signed)
Patient transported to X-ray 

## 2018-10-19 NOTE — Progress Notes (Signed)
Rehab Admissions Coordinator Note:  Patient was screened by Cleatrice Burke for appropriateness for an Inpatient Acute Rehab Consult per PT and OT recs. Noted Neurology consult likely related to UTI. History of CVA, unable to obtain MRI.  At this time, we are recommending await further progress with therapy for admitted today and to also make definitive DX. UTI would not be DX appropriate for an inpt rehab admission. I will follow.Cleatrice Burke RN MSN 10/19/2018, 4:31 PM  I can be reached at [phone].

## 2018-10-19 NOTE — ED Triage Notes (Signed)
BIB [facility] from home with c/o of "decreased motor fx," pt states he just seems off since Tuesday. Neuro intact. Pt ambulatory on arrival.

## 2018-10-19 NOTE — H&P (Addendum)
Date: 10/19/2018               Patient Name:  [name] MRN: [id number]  DOB: 08-Apr-1961 Age / Sex: 58 y.o., male   PCP: Ladell Pier, MD         Medical Service: Internal Medicine Teaching Service         Attending Physician: Dr. Rebeca Alert Raynaldo Opitz, MD    First Contact: Dr. Annie Paras  Pager: [phone]  Second Contact: Dr. 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 He was in his normal state of health until Tuesday when he was standing in his kitchen and suddenly felt imbalanced like he might fall. He denies focal weakness but endorses generalized weakness and feeling "disoriented" when he walks. Initial, he was concerned that he may have eaten spoiled food, because his refrigerate broke sometime on Monday but he transferred all of his food to ice in a cooler within 24 hours. He endorsed some transient nausea, but no vomiting, diarrhea, or abdominal pain. After his balance did not improve, he came to the ED for evaluation. He lives alone and was scared he might fall at home. ROS negative for fever, chills, cough, and recent sick contacts. He denies dysuria, retention, weak stream, and difficulty initiating stream; however does endorse urinary frequency. No chest pain or SOB. Patient is retired and has been self isolating at home since the coronavirus shutdown.   On arrival to the ED he was afebrile T 98.8 and hemodynamically stable 128/81 with HR 68, RR 18, and oxygen 99% on RA. EKG demonstrated A-V dual paced rhythm. CBC was unremarkable. BMP significant for renal dysfunction Scr 2.10 GFR 34-39  up from baseline Scr of 1.8, troponin elevated 0.21, and UA with positive nitrites, large leukocytes, >50 wbc, and few bacteria. Rapid COVID-19 PCR was negative. CXR was unremarkable. CT head demonstrated chronic intracranial artery dolichoectasia with progressed calcified atherosclerosis since 2012. A lacune or infarct of the left pons also noted to be new since 2012, but likely chronic. Otherwise no acute intracranial abnormality.   Meds:  Current Meds  Medication Sig  . acetaminophen (TYLENOL) 500 MG tablet Take 500 mg by mouth every 14 (fourteen) days. Only taking 1 hour before infusion.  AS premed for Fabrazyme  . Agalsidase beta (FABRAZYME IV) Inject 115 mg into the vein every 14 (fourteen) days.   [name] Kitchen albuterol (PROAIR HFA) 108 (90 Base) MCG/ACT inhaler Inhale 1 puff into the lungs every 6 (six) hours as needed for wheezing or shortness of breath.  . allopurinol (ZYLOPRIM) 100 MG tablet Take 3 tablets (300 mg total) by mouth daily.  [name] Kitchen amLODipine (NORVASC) 10 MG tablet Take 1 tablet (10 mg total) by mouth daily.  [name] Kitchen aspirin 81 MG tablet Take 81 mg by mouth daily.   [name] Kitchen atorvastatin (LIPITOR) 20 MG tablet Take 1 tablet (20 mg total) by mouth daily. (Patient taking differently: Take 20 mg by mouth as needed (for cholesterol). )  . fexofenadine (ALLEGRA) 180 MG tablet Take 1 tablet (180 mg total) by mouth daily.  [name] Kitchen  fluticasone (FLONASE) 50 MCG/ACT nasal spray Place 1 spray into both nostrils daily. (Patient taking differently: Place 1 spray into both nostrils as needed for allergies. )  . furosemide (LASIX) 20 MG tablet Take 2 tablets (40 mg total) by mouth daily. Lower extremiity edema  . hydrocortisone valerate cream (WESTCORT) 0.2 % Apply to affected area daily PRN (Patient taking differently: Apply 1 application topically daily as needed (on affected areas of skin). )  . Insulin Lispro Prot & Lispro (HUMALOG 75/25 MIX) (75-25) 100 UNIT/ML Kwikpen INJECT 20 UNITS INTO THE SKIN 2 TIMES DAILY (Patient  taking differently: Inject 20 Units into the skin 2 (two) times a day. INJECT 20 UNITS INTO THE SKIN 2 TIMES DAILY)  . losartan-hydrochlorothiazide (HYZAAR) 50-12.5 MG tablet Take 1 tablet by mouth daily.  [name] Kitchen OLIVE LEAF PO Take 1 tablet by mouth 2 (two) times daily.     Allergies: Allergies as of 10/19/2018 - Review Complete 10/19/2018  Allergen Reaction Noted  . Lisinopril Anaphylaxis 08/11/2017  . Shellfish allergy Hives and Swelling 08/09/2011   Past Medical History:  Diagnosis Date  . Bell's palsy   . Bifascicular block   . Chronic bronchitis ([place])    "seasonal; get it q yr"  . Chronic diastolic CHF (congestive heart failure) ([place]) 2010  . Chronic kidney disease (CKD), stage II (mild)    stage II to III/notes 07/23/2014  . Diastolic heart failure secondary to hypertrophic cardiomyopathy ([place]) CARDIOLOGIST-- DR Daneen Schick  . Dyspnea    increased exertion   . Edema 07/2013  . 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 Gout, arthritis 2014   bil feet. right worse  . History of cellulitis of skin with lymphangitis LEFT LEG  . HOCM (hypertrophic obstructive cardiomyopathy) ([place])    a. Echo 11/16: Severe LVH, EF 55-60%, abnormal GLS consistent with HOCM, no SAM, mild LAE  . Hypertension 20 years  . Lymphedema of lower extremity LEFT  >  RIGHT   "using ankle high socks at home; pump doesn't work for me" (07/23/2014)  . Pacemaker    02/12/11  . Pneumonia 08/2011  . Seasonal asthma NO INHALERS   30 years  . Second degree Mobitz II AV block    with syncope, s/p PPM  . Short of breath on exertion   . Type II diabetes mellitus ([place]) 2012   INSULIN DEPENDENT    Family History: Brother deceased from 2 disease, mom was a carrier   Social History: Never smoker, drinks only on special occasions, denies illicit drug use. 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 Lives alone and has been self isolating since coronavirus  shut down.   Review of Systems: A complete ROS was negative except as per HPI.   Physical Exam: Blood pressure 137/89, pulse 60, temperature 98 F (36.7 C), temperature source Oral, resp. rate 16, height 5\' 10"  (1.778 m), weight 132.9 kg, SpO2 97 %. Constitutional: NAD, appears comfortable HEENT: Atraumatic, normocephalic. PERRL, anicteric sclera.  Neck: Supple, trachea midline.  Cardiovascular: RRR, no murmurs, rubs, or gallops.  Pulmonary/Chest: CTAB, no wheezes, rales, or rhonchi. Large healed skin defect / scar over mid upper back Abdominal: Soft, non tender, non distended. +BS.  Extremities: Warm and well perfused. Significant bilateral non pitting edema with some overlying chronic skin changes and keloid formation  Neurological: A&Ox3, CN II - XII grossly intact.  Skin: No rashes or erythema  Psychiatric: Normal mood and affect   EKG:  personally reviewed my interpretation is: A-V dual paced rhythm   CXR: personally reviewed my interpretation is: Mild cardiomegaly, pacemaker with atrial and ventricular leads in place, no consolidation or infiltrate. Overall normal CXR.   10/19/2018 CT Head: IMPRESSION: 1. Chronic intracranial artery dolichoectasia with progressed calcified atherosclerosis since 2012. A lacune or infarct of the left pons is new since 2012 but probably chronic. Still, consider Acute Small Vessel Ischemia in this clinical setting. 2. No intracranial hemorrhage or cortically based infarct identified.  Assessment & Plan by Problem: Active Problems:   UTI (urinary tract infection)  Generalized weakness / Ataxia: No focal deficits on exam. CT head demonstrated chronic intracranial artery dolichoectasia consistent with his diagnosis of Fabry's disease as well as a likely chronic lacunar infarct of the left pons. Unable to obtain MRI due to pacemaker. Neurology has been consulted, appreciate assistance.  -- Continue ASA 81  -- Reports only taking his statin "as  needed" ?  -- Check lipid panel  -- Hgb A1c -- Telemetry  -- PT/OT  Asymptomatic Bacteriuria: No symptoms aside from urinary frequency. No fevers, chills or leukocytosis. However in the setting of his new ataxia, it is definitely reasonable to treat. Unclear reason for UTI. He has no history of UTIs and denies symptoms of BPH. No hematuria and he denies history of nephrolithiasis.  -- Continue ceftriaxone -- F/u UCx  -- Strict I&Os, monitor for retention   AKI: CKD III with baseline Scr ~1.8 -- Hold losartan-HCTZ & lasix -- AM BMP  Type II DM:  -- CBG monitoring TID  -- Continue home humalog 75/25 20 units BID  HFpEF Hx Complete heart block with pacemaker HTN -- Continue home amlodipine -- Holding losartan-HCTZ & lasix  -- Telemetry   Hx Gout -- Continue home allopurinol   Hx COPD with asthma: -- Continue albuterol prn   Lymphedema: Chronic  -- Holding lasix with AKI -- Unna wrappings   FEN: No fluids, replete lytes prn, HH/Carb mod VTE ppx: Lovenox  Code Status: DNR  Dispo: Admit patient to Inpatient with expected length of stay greater than 2 midnights.  SignedVelna Ochs, MD 10/19/2018, 10:26 AM  Pager: [phone]

## 2018-10-19 NOTE — Consult Note (Addendum)
Neurology Consultation  Reason for Consult: Generalized weakness Referring Physician: Alvino Chapel  CC: Feeling off balance for 3 days  History is obtained from: Patient  HPI: [name] is a 58 y.o. male with past medical history of diabetes, second-degree Mobitz with AV block requiring a pacemaker which is not MRI compatible, lymphedema in the lower extremities, HOCM, Fabry's disease, diastolic heart failure, chronic kidney disease, heart failure, Bell's palsy.  Patient states that the weakness northbound started on Tuesday of this week.  He states he was washing dishes when his knees buckled and he felt off balance.  Patient sat down and drank some milk and gave himself insulin.  He realized that he was drinking milk from a broken fridge rater which he was worried he might of had food poisoning and also he had taken the insulin which had not been cooled by the fridge rater.  He did call his PCP which said that is probably not the best however his glucose was only 131.  The next day on Wednesday the symptoms did not abate and he called his PCP again however they did not have any clinic.  Thus Thursday he called 911.  On Thursday he also noted that he had a headache, left facial paresthesias along with pain behind his eyes.  Patient came to the hospital today.  Labs showed a creatinine of 2.10, along with a troponin that was elevated at 0.21.  Patient was also noted to have a urinary tract infection.  Due to the weakness neurology was consulted.    ROS: A 14 point ROS was performed and is negative except as noted in the HPI.   Past Medical History:  Diagnosis Date  . Bell's palsy   . Bifascicular block   . Chronic bronchitis ([place])    "seasonal; get it q yr"  . Chronic diastolic CHF (congestive heart failure) ([place]) 2010  . Chronic kidney disease (CKD), stage II (mild)    stage II to III/notes 07/23/2014  . Diastolic heart failure secondary to hypertrophic cardiomyopathy ([place]) CARDIOLOGIST--  DR Daneen Schick  . Dyspnea    increased exertion   . Edema 07/2013  . 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 Gout, arthritis 2014   bil feet. right worse  . History of cellulitis of skin with lymphangitis LEFT LEG  . HOCM (hypertrophic obstructive cardiomyopathy) ([place])    a. Echo 11/16: Severe LVH, EF 55-60%, abnormal GLS consistent with HOCM, no SAM, mild LAE  . Hypertension 20 years  . Lymphedema of lower extremity LEFT  >  RIGHT   "using ankle high socks at home; pump doesn't work for me" (07/23/2014)  . Pacemaker    02/12/11  . Pneumonia 08/2011  . Seasonal asthma NO INHALERS   30 years  . Second degree Mobitz II AV block    with syncope, s/p PPM  . Short of breath on exertion   . Type II diabetes mellitus ([place]) 2012   INSULIN DEPENDENT     Family History  Problem Relation Age of Onset  . Stroke Mother   . Colon cancer Neg Hx     Social History:   reports that he has never smoked. He has never used smokeless tobacco. He reports current alcohol use. He reports that he does not use drugs.  Medications  Current Facility-Administered Medications:  .  cefTRIAXone (ROCEPHIN) 1 g in sodium chloride 0.9 % 100 mL IVPB, 1 g, Intravenous, Once, Davonna Belling,  MD  Current Outpatient Medications:  .  ACCU-CHEK AVIVA PLUS test strip, TEST 3 TIMES DAILY. ICD 10 E11.8, Disp: 100 each, Rfl: 12 .  ACCU-CHEK FASTCLIX LANCETS MISC, Use to test blood sugar 3 to 4 times daily., Disp: 102 each, Rfl: 5 .  acetaminophen (TYLENOL) 500 MG tablet, Take 500 mg by mouth every 14 (fourteen) days. Only taking 1 hour before infusion.  AS premed for Fabrazyme, Disp: , Rfl:  .  Agalsidase beta (FABRAZYME IV), Inject 115 mg into the vein every 14 (fourteen) days. , Disp: , Rfl:  .  albuterol (PROAIR HFA) 108 (90 Base) MCG/ACT inhaler, Inhale 1 puff into the lungs every 6 (six) hours as needed for wheezing or shortness of breath., Disp: 8.5 Inhaler, Rfl: 1 .   allopurinol (ZYLOPRIM) 100 MG tablet, Take 3 tablets (300 mg total) by mouth daily., Disp: 90 tablet, Rfl: 1 .  amLODipine (NORVASC) 10 MG tablet, Take 1 tablet (10 mg total) by mouth daily., Disp: 30 tablet, Rfl: 11 .  aspirin 81 MG tablet, Take 81 mg by mouth daily. , Disp: , Rfl:  .  atorvastatin (LIPITOR) 20 MG tablet, Take 1 tablet (20 mg total) by mouth daily., Disp: 30 tablet, Rfl: 5 .  fexofenadine (ALLEGRA) 180 MG tablet, Take 1 tablet (180 mg total) by mouth daily., Disp: 30 tablet, Rfl: 3 .  fluticasone (FLONASE) 50 MCG/ACT nasal spray, Place 1 spray into both nostrils daily., Disp: 16 g, Rfl: 1 .  furosemide (LASIX) 20 MG tablet, Take 2 tablets (40 mg total) by mouth daily. Lower extremiity edema, Disp: 120 tablet, Rfl: 6 .  hydrocortisone valerate cream (WESTCORT) 0.2 %, Apply to affected area daily PRN, Disp: 45 g, Rfl: 0 .  Insulin Lispro Prot & Lispro (HUMALOG 75/25 MIX) (75-25) 100 UNIT/ML Kwikpen, INJECT 20 UNITS INTO THE SKIN 2 TIMES DAILY, Disp: 15 mL, Rfl: 2 .  Insulin Pen Needle (PX SHORTLENGTH PEN NEEDLES) 31G X 8 MM MISC, 1 each by Does not apply route 2 (two) times daily before a meal., Disp: 100 each, Rfl: 8 .  losartan-hydrochlorothiazide (HYZAAR) 50-12.5 MG tablet, Take 1 tablet by mouth daily., Disp: 30 tablet, Rfl: 5 .  OLIVE LEAF PO, Take 1 tablet by mouth 2 (two) times daily. , Disp: , Rfl:    Exam: Current vital signs: BP (!) 151/79   Pulse 63   Temp 98 F (36.7 C) (Oral)   Resp 16   Ht 5\' 10"  (1.778 m)   Wt 132.9 kg   SpO2 99%   BMI 42.04 kg/m  Vital signs in last 24 hours: Temp:  [98 F (36.7 C)] 98 F (36.7 C) (05/14 0605) Pulse Rate:  [63] 63 (05/14 0630) Resp:  [14-16] 16 (05/14 0630) BP: (151-155)/(79-90) 151/79 (05/14 0630) SpO2:  [98 %-99 %] 99 % (05/14 0630) Weight:  [132.9 kg] 132.9 kg (05/14 0605)  Physical Exam  Constitutional: Appears well-developed and well-nourished.  Psych: Affect appropriate to situation Eyes: No scleral  injection, periocular edema which is normal for him HENT: No OP obstrucion Head: Normocephalic.  Cardiovascular: Paced Respiratory: Effort normal, non-labored breathing GI: Soft.  No distension. There is no tenderness.  Skin: WDI  Neuro: Mental Status: Patient is awake, alert, oriented to person, place, month, year, and situation. Patient is able to give a clear and coherent history. No signs of aphasia or neglect Cranial Nerves: II: Visual Fields are full.  III,IV, VI: EOMI without ptosis or diploplia. Pupils equal, round and reactive to  light V: Facial sensation is symmetric to temperature VII: Facial movement is symmetric.  VIII: hearing is intact to voice X: Palat elevates symmetrically XI: Shoulder shrug is symmetric. XII: tongue is midline without atrophy or fasciculations.  Motor: Tone is normal. Bulk is normal. 5/5 strength was present in all four extremities.  Sensory: Sensation is symmetric to light touch and temperature in upper extremities however decreased in lower extremities secondary to significant lymphedema Deep Tendon Reflexes: 1+ in upper extremities symmetrically with no knee jerk and no Achilles Plantars: Toes are downgoing bilaterally. Cerebellar: Finger-nose and heel-to-shin were within normal limits with no dysmetria Gait Patient was very off balance with shuffling steps with his gait he needed to use a walker to maintain stability   Labs I have reviewed labs in epic and the results pertinent to this consultation are:   CBC    Component Value Date/Time   WBC 7.2 10/19/2018 0606   RBC 5.37 10/19/2018 0606   HGB 15.9 10/19/2018 0606   HCT 48.9 10/19/2018 0606   PLT 229 10/19/2018 0606   MCV 91.1 10/19/2018 0606   MCH 29.6 10/19/2018 0606   MCHC 32.5 10/19/2018 0606   RDW 13.3 10/19/2018 0606   LYMPHSABS 1.9 10/19/2018 0606   MONOABS 0.7 10/19/2018 0606   EOSABS 0.2 10/19/2018 0606   BASOSABS 0.0 10/19/2018 0606    CMP     Component  Value Date/Time   NA 144 10/19/2018 0606   NA 144 10/06/2016 1038   K 3.4 (L) 10/19/2018 0606   CL 105 10/19/2018 0606   CO2 25 10/19/2018 0606   GLUCOSE 130 (H) 10/19/2018 0606   BUN 28 (H) 10/19/2018 0606   BUN 23 10/06/2016 1038   CREATININE 2.10 (H) 10/19/2018 0606   CREATININE 1.28 11/07/2014 0910   CALCIUM 9.8 10/19/2018 0606   PROT 8.0 09/22/2018 0924   PROT 7.4 10/06/2016 1038   ALBUMIN 3.6 09/22/2018 0924   ALBUMIN 3.5 10/06/2016 1038   AST 31 09/22/2018 0924   ALT 23 09/22/2018 0924   ALKPHOS 75 09/22/2018 0924   BILITOT 0.5 09/22/2018 0924   BILITOT 0.4 10/06/2016 1038   GFRNONAA 34 (L) 10/19/2018 0606   GFRNONAA 63 11/07/2014 0910   GFRAA 39 (L) 10/19/2018 0606   GFRAA 73 11/07/2014 0910    Lipid Panel     Component Value Date/Time   CHOL 233 (H) 10/19/2017 0822   CHOL 280 (H) 10/06/2016 1038   TRIG 94 10/19/2017 0822   HDL 52 10/19/2017 0822   HDL 59 10/06/2016 1038   CHOLHDL 4.5 10/19/2017 0822   VLDL 19 10/19/2017 0822   LDLCALC 162 (H) 10/19/2017 0822   LDLCALC 201 (H) 10/06/2016 1038     Imaging I have reviewed the images obtained:  CT-scan of the brain- no intracranial hemorrhage or cortically based infarct identified.  Chronic intracranial artery dolichoectasia with progressive calcification since 2012.  Lucrin or infarct of the left pons is new since 2012 but probably chronic.  MRI examination of the brain-has a pacemaker which is noncompatible with MRI  Etta Quill PA-C [facility] [phone]  M-F  (9:00 am- 5:00 PM)  10/19/2018, 9:08 AM   I have seen the patient and reviewed the above note.  He describes unsteady gait, side-by-side diplopia, some nausea vomiting.  On my exam, he has ataxia of the left arm, a beating nystagmus with upgaze right beating nystagmus with right gaze, worsened diplopia on left lateral gaze with no diplopia on rightward gaze, though  clear disconjugate eyes were not seen.  Assessment:  58 year old  male with diplopia, multidirectional nystagmus, left-sided ataxia consistent with a small brainstem infarct.  He cannot get an MRI, but would treat as a small brainstem infarct.  Recommendations: - HgbA1c, fasting lipid panel - Frequent neuro checks - Echocardiogram - Carotid dopplers - Prophylactic therapy-Antiplatelet med: Aspirin  - Risk factor modification - Telemetry monitoring - PT consult, OT consult, Speech consult - Stroke team to follow  Roland Rack, MD [facility] [phone]  If 7pm- 7am, please page neurology on call as listed in [employer].

## 2018-10-19 NOTE — ED Notes (Signed)
Neuro into see pt and eval. They spoke to dr Alvino Chapel

## 2018-10-19 NOTE — ED Provider Notes (Signed)
  Physical Exam  BP (!) 151/79   Pulse 63   Temp 98 F (36.7 C) (Oral)   Resp 16   Ht 5\' 10"  (1.778 m)   Wt 132.9 kg   SpO2 99%   BMI 42.04 kg/m   Physical Exam  ED Course/Procedures     Procedures  MDM  Received patient in signout.  Weakness and difficulty walking.  Does have Fabry's disease.  However does also have urinary tract infection.  Head CT reassuring.  Troponin mildly elevated but EKG paced and no chest pain.  Will admit to unassigned medicine.  Has been seen by neurology.  Has pacemaker an cannot get MRI.       Davonna Belling, MD 10/19/18 [phone]

## 2018-10-20 ENCOUNTER — Encounter (HOSPITAL_COMMUNITY): Payer: Medicare Other

## 2018-10-20 ENCOUNTER — Inpatient Hospital Stay (HOSPITAL_COMMUNITY): Payer: Medicare Other

## 2018-10-20 DIAGNOSIS — I6381 Other cerebral infarction due to occlusion or stenosis of small artery: Principal | ICD-10-CM

## 2018-10-20 DIAGNOSIS — I639 Cerebral infarction, unspecified: Secondary | ICD-10-CM

## 2018-10-20 DIAGNOSIS — Z950 Presence of cardiac pacemaker: Secondary | ICD-10-CM

## 2018-10-20 DIAGNOSIS — E1122 Type 2 diabetes mellitus with diabetic chronic kidney disease: Secondary | ICD-10-CM

## 2018-10-20 DIAGNOSIS — R531 Weakness: Secondary | ICD-10-CM

## 2018-10-20 DIAGNOSIS — R350 Frequency of micturition: Secondary | ICD-10-CM

## 2018-10-20 DIAGNOSIS — H532 Diplopia: Secondary | ICD-10-CM

## 2018-10-20 DIAGNOSIS — I130 Hypertensive heart and chronic kidney disease with heart failure and stage 1 through stage 4 chronic kidney disease, or unspecified chronic kidney disease: Secondary | ICD-10-CM

## 2018-10-20 DIAGNOSIS — I6329 Cerebral infarction due to unspecified occlusion or stenosis of other precerebral arteries: Secondary | ICD-10-CM

## 2018-10-20 DIAGNOSIS — I5030 Unspecified diastolic (congestive) heart failure: Secondary | ICD-10-CM

## 2018-10-20 DIAGNOSIS — R2689 Other abnormalities of gait and mobility: Secondary | ICD-10-CM

## 2018-10-20 DIAGNOSIS — Z79899 Other long term (current) drug therapy: Secondary | ICD-10-CM

## 2018-10-20 DIAGNOSIS — N179 Acute kidney failure, unspecified: Secondary | ICD-10-CM

## 2018-10-20 DIAGNOSIS — I442 Atrioventricular block, complete: Secondary | ICD-10-CM

## 2018-10-20 DIAGNOSIS — R8281 Pyuria: Secondary | ICD-10-CM

## 2018-10-20 DIAGNOSIS — R29810 Facial weakness: Secondary | ICD-10-CM

## 2018-10-20 DIAGNOSIS — Z8673 Personal history of transient ischemic attack (TIA), and cerebral infarction without residual deficits: Secondary | ICD-10-CM

## 2018-10-20 DIAGNOSIS — J449 Chronic obstructive pulmonary disease, unspecified: Secondary | ICD-10-CM

## 2018-10-20 LAB — GLUCOSE, CAPILLARY
Glucose-Capillary: 76 mg/dL (ref 70–99)
Glucose-Capillary: 70 mg/dL (ref 70–99)
Glucose-Capillary: 175 mg/dL — ABNORMAL HIGH (ref 70–99)
Glucose-Capillary: 135 mg/dL — ABNORMAL HIGH (ref 70–99)

## 2018-10-20 LAB — LIPID PANEL
Cholesterol: 232 mg/dL — ABNORMAL HIGH (ref 0–200)
HDL: 53 mg/dL (ref >40)
LDL Cholesterol: 161 mg/dL — ABNORMAL HIGH (ref 0–99)
Total CHOL/HDL Ratio: 4.4 RATIO
Triglycerides: 89 mg/dL (ref <150)
VLDL: 18 mg/dL (ref 0–40)

## 2018-10-20 LAB — HEMOGLOBIN A1C
Hgb A1c MFr Bld: 7.4 % — ABNORMAL HIGH (ref 4.8–5.6)
Mean Plasma Glucose: 165.68 mg/dL

## 2018-10-20 LAB — BASIC METABOLIC PANEL
Anion gap: 10 (ref 5–15)
BUN: 29 mg/dL — ABNORMAL HIGH (ref 6–20)
CO2: 25 mmol/L (ref 22–32)
Calcium: 8.9 mg/dL (ref 8.9–10.3)
Chloride: 107 mmol/L (ref 98–111)
Creatinine, Ser: 2.3 mg/dL — ABNORMAL HIGH (ref 0.61–1.24)
GFR calc Af Amer: 35 mL/min — ABNORMAL LOW (ref >60)
GFR calc non Af Amer: 30 mL/min — ABNORMAL LOW (ref >60)
Glucose, Bld: 87 mg/dL (ref 70–99)
Potassium: 3.8 mmol/L (ref 3.5–5.1)
Sodium: 142 mmol/L (ref 135–145)

## 2018-10-20 LAB — ECHOCARDIOGRAM COMPLETE
Height: 70 in
Weight: 4688 oz

## 2018-10-20 LAB — HIV ANTIBODY (ROUTINE TESTING W REFLEX): HIV Screen 4th Generation wRfx: NONREACTIVE

## 2018-10-20 MED ORDER — ATORVASTATIN CALCIUM 80 MG PO TABS
80.0000 mg | ORAL_TABLET | Freq: Every day | ORAL | Status: DC
  Administered 2018-10-20 – 2018-10-22 (×3): 80 mg via ORAL
  Filled 2018-10-20 (×3): qty 1

## 2018-10-20 MED ORDER — CLOPIDOGREL BISULFATE 75 MG PO TABS
75.0000 mg | ORAL_TABLET | Freq: Every day | ORAL | Status: DC
  Administered 2018-10-20 – 2018-10-23 (×4): 75 mg via ORAL
  Filled 2018-10-20 (×4): qty 1

## 2018-10-20 MED ORDER — INSULIN ASPART PROT & ASPART (70-30 MIX) 100 UNIT/ML ~~LOC~~ SUSP
17.0000 [IU] | Freq: Two times a day (BID) | SUBCUTANEOUS | Status: DC
  Administered 2018-10-20: 17 [IU] via SUBCUTANEOUS
  Filled 2018-10-20: qty 10

## 2018-10-20 NOTE — Progress Notes (Signed)
Physical Therapy Treatment Patient Details Name: [name] MRN: [id number] DOB: [birth year] Today's Date: 10/20/2018    History of Present Illness Pt is a 58 y/o male admitted secondary to gait instability. CT of the brain was negative for any acute findings. MRI unable to be obtained due to pacemaker. Further Neuro work-up pending. PMH including but not limited to CHF, DM, HTN, CKD and Fabry's disease.    PT Comments    Session focused on gait and balance, he is very unsteady to his Lt requiring max A +2 on several occasions. PT recommending CIR as he shows good effort with PT, he lives alone and due to his unsteadiness PT does not feel he can safely discharge home at this point. PT will continue to work with patient on his deficits.    Follow Up Recommendations  CIR;Supervision/Assistance - 24 hour     Equipment Recommendations  None recommended by PT    Recommendations for Other Services       Precautions / Restrictions Precautions Precautions: Fall Precaution Comments: consistent L lean in standing and with ambulation Restrictions Weight Bearing Restrictions: No    Mobility  Bed Mobility Overal bed mobility: Needs Assistance Bed Mobility: Supine to Sit;Sit to Supine     Supine to sit: HOB elevated;Min guard Sit to supine: Min guard   General bed mobility comments: increased time and effort, min guard for safety  Transfers Overall transfer level: Needs assistance Equipment used: Rolling walker (2 wheeled) Transfers: Sit to/from Stand Sit to Stand: Min assist         General transfer comment: min A for balance only upon standing  Ambulation/Gait Ambulation/Gait assistance: Max assist;+2 physical assistance;+2 safety/equipment Gait Distance (Feet): 40 Feet(X 2 with one seated rest break (chair follow)) Assistive device: Rolling walker (2 wheeled) Gait Pattern/deviations: Step-to pattern;Decreased step length - right;Decreased step length - left;Decreased  stride length;Decreased weight shift to right;Decreased stance time - right;Ataxic;Scissoring Gait velocity: decreased   General Gait Details: pt requires max A to keep from falling every 5 steps or so to his Lt. Other than that he is mostly mod A for ambulation. He continues to have significant L lateral lean and LOB laterally towards L; He also can not get his shoes fully on due to swelling in his feet making balance more difficult for him. He also scissors at times and tries to step to fast before having regained his balance.        Balance Overall balance assessment: Needs assistance Sitting-balance support: Feet supported Sitting balance-Leahy Scale: Good     Standing balance support: During functional activity;Bilateral upper extremity supported Standing balance-Leahy Scale: Poor Standing balance comment: L lateral lean to Lt and he had decreased arareness of this as well as disorientation to midline                            Cognition Arousal/Alertness: Awake/alert Behavior During Therapy: WFL for tasks assessed/performed Overall Cognitive Status: Impaired/Different from baseline Area of Impairment: Problem solving;Safety/judgement                         Safety/Judgement: Decreased awareness of deficits   Problem Solving: Difficulty sequencing;Requires verbal cues               Pertinent Vitals/Pain Pain Assessment: No/denies pain    Home Living Family/patient expects to be discharged to:: Private residence Living Arrangements: Alone Available Help at Discharge:  Friend(s);Available PRN/intermittently Type of Home: House Home Access: Stairs to enter Entrance Stairs-Rails: Right;Left;Can reach both Home Layout: One level Home Equipment: Environmental consultant - 2 wheels Additional Comments: rw is left over from diabetic event with balance deficits afterward/ 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    Prior Function Level of Independence: Independent       Comments: driving, retired   PT Goals (current goals can now be found in the care plan section) Acute Rehab PT Goals Patient Stated Goal: work on balance PT Goal Formulation: With patient Time For Goal Achievement: 11/02/18 Potential to Achieve Goals: Good    Frequency    Min 5X/week           Co-evaluation PT/OT/SLP Co-Evaluation/Treatment: Yes Reason for Co-Treatment: Complexity of the patient's impairments (multi-system involvement);For patient/therapist safety;To address functional/ADL transfers PT goals addressed during session: Mobility/safety with mobility;Balance;Proper use of DME        AM-PAC PT "6 Clicks" Mobility   Outcome Measure  Help needed turning from your back to your side while in a flat bed without using bedrails?: None Help needed moving from lying on your back to sitting on the side of a flat bed without using bedrails?: None Help needed moving to and from a bed to a chair (including a wheelchair)?: A Lot Help needed standing up from a chair using your arms (e.g., wheelchair or bedside chair)?: A Lot Help needed to walk in hospital room?: A Lot Help needed climbing 3-5 steps with a railing? : Total 6 Click Score: 15    End of Session Equipment Utilized During Treatment: Gait belt Activity Tolerance: Patient limited by fatigue Patient left: in bed;with call bell/phone within reach;with bed alarm set Nurse Communication: Mobility status PT Visit Diagnosis: Other abnormalities of gait and mobility (R26.89);Ataxic gait (R26.0)     Time: 0127-0137 PT Time Calculation (min) (ACUTE ONLY): 10 min  Charges:  $Gait Training: 8-22 mins                     Elsie Ra, PT,DPT [place] [facility] [phone]    Debbe Odea 10/20/2018, 1:51 PM

## 2018-10-20 NOTE — Progress Notes (Signed)
  Date: 10/20/2018  Patient name: [name]  Medical record number: [id number]  Date of birth: [birth year]   I have seen and evaluated this patient and I have discussed the plan of care with the house staff. Please see their note for complete details. I concur with their findings with the following additions/corrections:   58 year old man with Fabry's disease, stroke, T2DM, HTN, COPD/asthma, HFpEF, complete heart block with pacemaker, and CKD stage III presenting with unsteadiness.  2 days before admission he developed weakness and unsteady gait as well as double vision.  For his Fabry's disease, he follows with a nephrologist and gets biweekly Fabrazyme infusions.  He has a personal history of stroke and a family history of Fabry's disease and strokes at a young age.  On exam, he was resting comfortably in bed.  He has diminished sensation on the left side of his face.  He reports double vision but is able to track reasonably well with EOM testing with intermittent slight nystagmus.  Strength and sensation is equal and intact in all 4 extremities.  Finger-to-nose testing demonstrates some inaccuracy and discoordination.  Rapid alternating movements are normal at a slow pace but deteriorate when he tries to speed up.  Cardiac, lung, and abdominal exams are normal.  Significant test results include: Creatinine 2.1, 2.3 (baseline 1.6-1.9) LDL 146 A1c 7.4% UA >50 WBCs, positive nitrites Urine culture pending U tox negative  CXR with no acute findings CT head with small infarct in the left pons, possibly chronic  In summary, this is a 58 year old man with Fabry's disease and prior stroke as well as T2DM, HTN, HLD, COPD/asthma, HFpEF, and CKD stage III presenting with weakness, unsteady gait, and double vision.  1.  Diplopia, imbalance, weakness: Exam and CT scan are concerning for acute punctate infarct in the left pons versus prior stroke with recrudescence in the context of UTI.   Appreciate neurology consult. -Planning repeat CT head to evaluate for progression of the left pontine infarct, MRI not possible with his pacemaker -We will work on risk factor modification.  He admits to not taking his statin recently because he had cut out eggs and thought he was only supposed to be taking it when he ate eggs would benefit from improved A1c as well.  We will reach out to his PCP  2.  Pyuria: With urinary frequency, concerning for acute cystitis. -Continue empiric treatment with ceftriaxone -Follow-up urine culture  3.  Fabry's disease: We will coordinate Fabrazyme infusion  Lenice Pressman, M.D., Ph.D. 10/20/2018, 1:52 PM

## 2018-10-20 NOTE — Evaluation (Signed)
Speech Language Pathology Evaluation Patient Details Name: [name] MRN: [id number] DOB: 09/19/1960 Today's Date: 10/20/2018 Time: 4098-1191 SLP Time Calculation (min) (ACUTE ONLY): 19 min  Problem List:  Patient Active Problem List   Diagnosis Date Noted  . UTI (urinary tract infection) 10/19/2018  . Complete heart block ([place]) 08/01/2018  . Upper airway cough syndrome 08/01/2018  . Lymphedema 10/06/2016  . Gout, arthritis   . 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 Onychomycosis of toenail 09/26/2014  . Diabetes mellitus type 2 in obese ([place])   . Iron deficiency anemia 07/29/2014  . CKD (chronic kidney disease), stage III ([place])   . Chronic diastolic HF (heart failure) ([place]) 03/27/2013  . Peripheral edema 07/20/2012  . Obstructive sleep apnea, suspected 06/21/2012  . Pacemaker-St.[employer]ude 02/23/2012  . Hypertension 08/09/2011  . COPD with asthma ([place]) 08/09/2011  . Mobitz type II atrioventricular block 05/19/2011  . 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 Bell's palsy   . Bifascicular block   . Chronic bronchitis ([place])    "seasonal; get it q yr"  . Chronic diastolic CHF (congestive heart failure) ([place]) 2010  . Chronic kidney disease (CKD), stage II (mild)    stage II to III/notes 07/23/2014  . Diastolic heart failure secondary to hypertrophic cardiomyopathy ([place]) CARDIOLOGIST-- DR Daneen Schick  . Dyspnea    increased exertion   . Edema 07/2013  . 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 Gout, arthritis 2014   bil feet. right worse  . History of cellulitis of skin with lymphangitis LEFT LEG  . HOCM (hypertrophic obstructive cardiomyopathy) ([place])    a. Echo 11/16: Severe LVH, EF 55-60%, abnormal GLS consistent with HOCM, no SAM, mild LAE  . Hypertension 20 years  . Lymphedema of lower extremity LEFT  >  RIGHT   "using ankle high socks at home; pump doesn't work for me" (07/23/2014)   . Pacemaker    02/12/11  . Pneumonia 08/2011  . Seasonal asthma NO INHALERS   30 years  . Second degree Mobitz II AV block    with syncope, s/p PPM  . Short of breath on exertion   . Type II diabetes mellitus ([place]) 2012   INSULIN DEPENDENT   Past Surgical History:  Past Surgical History:  Procedure Laterality Date  . CARDIAC CATHETERIZATION  03-12-2011  DR Daneen Schick   HYPERTROPHIC CARDIOMYOPATHY WITH LV CAVITY  APPEARANCE CONSISTANT WITH SIGNIFICANT APICAL HYPERTROPHY/ NORMAL LVSF / EF 55%/ EVIDENCE OF DIASTOLIC DYSFUNCTION WITH EDP OF 23-68mmHg AFTER A-WAVE/ NORMAL CORONARY ARTERIES  . EVALUATION UNDER ANESTHESIA WITH FISTULECTOMY N/A 06/23/2015   Procedure: EXAM UNDER ANESTHESIA WITH POSSIBLE FISTULOTOMY;  Surgeon: Judeth Horn, MD;  Location: [facility];  Service: General;  Laterality: N/A;  . HERNIA REPAIR  [id number]   Umbilical Hernia Repair  . INCISION AND DRAINAGE PERIRECTAL ABSCESS Left 07/29/2014   Procedure: IRRIGATION AND DEBRIDEMENT PERIRECTAL ABSCESS;  Surgeon: Doreen Salvage, MD;  Location: [facility];  Service: General;  Laterality: Left;  Prone position  . INSERT / REPLACE / REMOVE PACEMAKER  02/12/2011   SJM implanted by Dr Rayann Heman for Mobitz II second degree AV block and syncope  . IRRIGATION AND DEBRIDEMENT ABSCESS N/A 07/27/2013   Procedure: IRRIGATION AND DEBRIDEMENT OF SKIN, SOFT TISSUE AND MUSCLES OF UPPER BACK (11X19X4cm) WITH 10 BLADE AND PULSATILE LAVAGE ;  Surgeon: Gayland Curry, MD;  Location: [facility];  Service: General;  Laterality: N/A;  . UMBILICAL HERNIA REPAIR  03/22/2012   Procedure: HERNIA REPAIR UMBILICAL ADULT;  Surgeon: Leighton Ruff, MD;  Location: [facility];  Service: General;  Laterality: N/A;  Umbilical Hernia Repair    HPI:  Pt is a 58 y.o. 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 CT of the head showed  chronic intracranial artery dolichoectasia with progressed calcified atherosclerosis since 2012. A lacunar infarct of the left pons is new since 2012 but probably chronic.    Assessment / Plan / Recommendation Clinical Impression  Pt denied any baseline or new deficits in speech, language, or cognition. [facility] Cognitive Assessment 8.1 was completed to evaluate the pt's cognitive-linguistic skills. He achieved a score of 28/30 which is within the normal limits of 26 or more out of 30 and no speech/language deficits were demonstrated. Further skilled SLP services are not clinically indicated at this time. Pt and nursing were educated regarding this and both parties verbalized understanding as well as agreement with plan of care.    SLP Assessment  SLP Recommendation/Assessment: Patient does not need any further Speech Lanaguage Pathology Services SLP Visit Diagnosis: Cognitive communication deficit (R41.841)    Follow Up Recommendations  None    Frequency and Duration           SLP Evaluation Cognition  Overall Cognitive Status: Within Functional Limits for tasks assessed Arousal/Alertness: Awake/alert Orientation Level: Oriented X4 Attention: Focused;Sustained Focused Attention: Appears intact(Vigilance WNL: 1/1) Sustained Attention: Impaired Sustained Attention Impairment: Verbal complex(Serial 7s: 1/3) Memory: Appears intact(Immediate: 5/5; Delayed: 5/5) Awareness: Appears intact Problem Solving: Appears intact(5/5) Executive Function: Reasoning;Sequencing;Organizing Reasoning: Appears intact(Abstraction: 2/2) Sequencing: Impaired Sequencing Impairment: Verbal complex(Clock drawing: 2/3) Organizing: Appears intact(Backward digit span: 1/1)       Comprehension  Auditory Comprehension Overall Auditory Comprehension: Appears within functional limits for tasks assessed Yes/No Questions: Within Functional Limits Commands: Within Functional Limits(Complex- trail completion:  1/1) Conversation: Complex Reading Comprehension Reading Status: Not tested    Expression Expression Primary Mode of Expression: Verbal Verbal Expression Overall Verbal Expression: Appears within functional limits for tasks assessed Initiation: No impairment Level of Generative/Spontaneous Verbalization: Conversation Repetition: No impairment Naming: No impairment Responsive: Not tested Confrontation: Within functional limits(3/3) Divergent: (1/1) Verbal Errors: (None) Pragmatics: No impairment Written Expression Dominant Hand: Right Written Expression: (Copying cube: 1/1)   Oral / Motor  Oral Motor/Sensory Function Overall Oral Motor/Sensory Function: Within functional limits Motor Speech Overall Motor Speech: Appears within functional limits for tasks assessed Respiration: Within functional limits Phonation: Normal Resonance: Within functional limits Articulation: Within functional limitis Intelligibility: Intelligible Motor Planning: Witnin functional limits Motor Speech Errors: Not applicable   Mckaylee Dimalanta I. Hardin Negus, [place], [place] Office number [phone] Pager [phone]                   Horton Marshall 10/20/2018, 12:43 PM

## 2018-10-20 NOTE — Progress Notes (Signed)
STROKE TEAM PROGRESS NOTE   INTERVAL HISTORY I have personally reviewed history of presenting illness with the patient in detail.  He has strong family history of Fabry's disease but personally has not had any neurological symptoms of stroke until recently.  He developed gait ataxia for 2 days followed by double vision which has persisted.  CT scan shows age-indeterminate pontine infarct and MRI cannot be performed due to pacemaker.  His elevated creatinine will not permit CT angiogram /his cholesterol and sugars are both poorly controlled Vitals:   10/19/18 1948 10/19/18 2335 10/20/18 0323 10/20/18 0742  BP: (!) 142/77 134/82 (!) 159/90 (!) 148/78  Pulse: 60 60 61 (!) 59  Resp: 17 18 14 20   Temp: 98 F (36.7 C) 98.6 F (37 C) 98.5 F (36.9 C) 98.8 F (37.1 C)  TempSrc: Oral Oral Oral Oral  SpO2: 95% 100% 100% 98%  Weight:      Height:        CBC:  Recent Labs  Lab 10/19/18 0606  WBC 7.2  NEUTROABS 4.4  HGB 15.9  HCT 48.9  MCV 91.1  PLT 628    Basic Metabolic Panel:  Recent Labs  Lab 10/19/18 0606 10/20/18 0324  NA 144 142  K 3.4* 3.8  CL 105 107  CO2 25 25  GLUCOSE 130* 87  BUN 28* 29*  CREATININE 2.10* 2.30*  CALCIUM 9.8 8.9   Lipid Panel:     Component Value Date/Time   CHOL 232 (H) 10/20/2018 0324   CHOL 280 (H) 10/06/2016 1038   TRIG 89 10/20/2018 0324   HDL 53 10/20/2018 0324   HDL 59 10/06/2016 1038   CHOLHDL 4.4 10/20/2018 0324   VLDL 18 10/20/2018 0324   LDLCALC 161 (H) 10/20/2018 0324   LDLCALC 201 (H) 10/06/2016 1038   HgbA1c:  Lab Results  Component Value Date   HGBA1C 7.4 (H) 10/20/2018   Urine Drug Screen:     Component Value Date/Time   LABOPIA NONE DETECTED 10/19/2018 0641   COCAINSCRNUR NONE DETECTED 10/19/2018 0641   LABBENZ NONE DETECTED 10/19/2018 0641   AMPHETMU NONE DETECTED 10/19/2018 0641   THCU NONE DETECTED 10/19/2018 0641   LABBARB NONE DETECTED 10/19/2018 0641    Alcohol Level No results found for: [place] Obese middle-aged African-American male currently not in distress. . Afebrile. Head is nontraumatic. Neck is supple without bruit.    Cardiac exam no murmur or gallop. Lungs are clear to auscultation. Distal pulses are well felt.  He has chronic bilateral lower extremity edema due to lymphedema Neurological Exam :  Awake alert oriented to time place and person.  No aphasia apraxia or dysarthria.  Extraocular movements appears full range but has subjective vertical diplopia on looking in front as well as upward gaze.  There is saccadic dysmetria on lateral gaze right greater than left with few beats of end gaze nystagmus.  Mild left lower facial asymmetry when he smiles.  Tongue midline.  Motor system exam reveals no upper or lower extremity drift but slight diminished fine finger movements on the left.  Slight impaired finger-to-nose coordination on the left. ASSESSMENT/PLAN [name] is a 58 y.o. male with history of diabetes, second-degree Mobitz with AV block requiring a pacemaker which is not MRI compatible, lymphedema in the lower extremities, HOCM, Fabry's disease, diastolic heart failure, chronic kidney disease, heart failure, Bell's palsy presenting with headache, left facial paresthesias and pain behind his eyes.   Stroke:  Likely brainstem lacunar infarct secondary to small vessel disease source (too small to be seen on CT).  He has family history of Fabry's disease  CT head old L pontine lacune new since 2012. No hmg. Chronic dolichoectasia progressed since 2012  Repeat CT to confirm stroke  TCD pending  Carotid Doppler  B ICA 1-39% stenosis, VAs antegrade   2D Echo pending  HIV neg  LDL 161  UDS negative  HgbA1c 7.4  Lovenox 40 mg sq daily for VTE prophylaxis  aspirin 81 mg daily prior to admission, now on aspirin 81 mg daily. Given mild stroke, recommend aspirin 81 mg and plavix 75 mg daily x 3 weeks, then PLAVIX alone. Orders adjusted.   Therapy  recommendations:  CIR  Disposition:  pending   Hypertension  Stable . Permissive hypertension (OK if < 220/120) but gradually normalize in 5-7 days . Long-term BP goal normotensive  Hyperlipidemia  Home meds: Lipitor 20  Now on Lipitor 80 mg daily  LDL 161, goal < 70  Continue statin at discharge  Diabetes type II Uncontrolled  HgbA1c 7.4, goal < 7.0  Other Stroke Risk Factors  ETOH use, advised to drink no more than 2 drink(s) a day  Morbid obesity, Body mass index is 42.04 kg/m., recommend weight loss, diet and exercise as appropriate   Family hx stroke (mother)  Chronic diastolic congestive heart failure   Hypertrophic obstructive cardiomyopathy with EF 55 to 60%   Fabry's disease  Other Active Problems  second-degree Mobitz with AV block requiring a pacemaker which is not MRI compatible  lymphedema in the lower extremities  Hx Bell's Palsy  AKI  Hx gout  Hx COPD w/ asthma  UTI  Hospital day # 1  I have personally obtained history,examined this patient, reviewed notes, independently viewed imaging studies, participated in medical decision making and plan of care.ROS completed by me personally and pertinent positives fully documented  I have made any additions or clarifications directly to the above note.  He presented with gait ataxia and diplopia secondary to small brainstem infarct not visualized on CT scan likely from small vessel disease related to uncontrolled diabetes, hypertension, hyperlipidemia and obesity.  Recommend dual antiplatelet therapy of aspirin and Plavix for 3 weeks followed by Plavix alone.  He does have Fabry's disease but has been taking regular infusions of Fabrazyme and I doubt if that is contributing to his stroke.  Continue ongoing stroke work-up and physical occupational therapy and rehab consults.  Long discussion with Dr. Rebeca Alert and internal medicine teaching team and answered questions.  Greater than 50% time during this  35-minute visit were spent on counseling and coordination of care about his lacunar stroke and discussion about stroke prevention and treatment and answering questions.  Antony Contras, MD Medical Director [id number] [facility] Pager: [phone] 10/20/2018 1:05 PM   To contact Stroke Continuity provider, please refer to [email or web address] After hours, contact General Neurology

## 2018-10-20 NOTE — Progress Notes (Signed)
Inpatient Rehabilitation Admissions Coordinator  Noted Dr. Clydene Fake recommendations. Would recommend CIR consult. Please place order.  Danne Baxter, RN, MSN Rehab Admissions Coordinator [phone] 10/20/2018 1:38 PM

## 2018-10-20 NOTE — Progress Notes (Signed)
Occupational Therapy Treatment Patient Details Name: [name] MRN: [id number] DOB: 1960/09/23 Today's Date: 10/20/2018    History of present illness Pt is a 58 y/o male admitted secondary to gait instability. CT of the brain was negative for any acute findings. MRI unable to be obtained due to pacemaker. Further Neuro work-up pending. PMH including but not limited to CHF, DM, HTN, CKD and Fabry's disease.   OT comments  Pt needs to wear glasses with all transfers to allow for single vision at this time. Pt with multiple LOB to the L during session with poor safety awareness. Pt progressing to total +2 mod (A) at times in session but demonstrates scissor gait and lifting RW.   Follow Up Recommendations  CIR;Supervision/Assistance - 24 hour    Equipment Recommendations  3 in 1 bedside commode;Other (comment)    Recommendations for Other Services      Precautions / Restrictions Precautions Precautions: Fall Precaution Comments: consistent L lean in standing and with ambulation Restrictions Weight Bearing Restrictions: No       Mobility Bed Mobility Overal bed mobility: Needs Assistance Bed Mobility: Supine to Sit;Sit to Supine     Supine to sit: HOB elevated;Min guard Sit to supine: Min guard   General bed mobility comments: increased time and effort, min guard for safety  Transfers Overall transfer level: Needs assistance Equipment used: Rolling walker (2 wheeled) Transfers: Sit to/from Stand Sit to Stand: Min assist         General transfer comment: min A for balance only upon standing    Balance Overall balance assessment: Needs assistance Sitting-balance support: Feet supported Sitting balance-Leahy Scale: Good     Standing balance support: During functional activity;Bilateral upper extremity supported Standing balance-Leahy Scale: Poor Standing balance comment: L lateral lean to Lt and he had decreased arareness of this as well as disorientation to  midline                           ADL either performed or assessed with clinical judgement   ADL Overall ADL's : Needs assistance/impaired     Grooming: Minimal assistance;Standing(mod (A0 for balance)               Lower Body Dressing: Moderate assistance Lower Body Dressing Details (indicate cue type and reason): pt don shoes with the back folded down due to the edema in bil LE. pt reports searching for 2 years for shoes to fit his feet                     Vision   Vision Assessment?: Yes Eye Alignment: Impaired (comment)(R eye esotropia) Diplopia Assessment: Objects split on top of one another;Present in primary gaze;Present in near gaze Additional Comments: pt provided glasses with R eye tape on nasal side. pt reports 1 in all directions now.    Perception     Praxis      Cognition Arousal/Alertness: Awake/alert Behavior During Therapy: WFL for tasks assessed/performed Overall Cognitive Status: Impaired/Different from baseline Area of Impairment: Problem solving;Safety/judgement                         Safety/Judgement: Decreased awareness of deficits   Problem Solving: Difficulty sequencing;Requires verbal cues          Exercises     Shoulder Instructions       General Comments edema bil LE with poor fitting shoes  Pertinent Vitals/ Pain       Pain Assessment: No/denies pain  Home Living Family/patient expects to be discharged to:: Private residence Living Arrangements: Alone Available Help at Discharge: Friend(s);Available PRN/intermittently Type of Home: House Home Access: Stairs to enter [employer] of Steps: 2 Entrance Stairs-Rails: Right;Left;Can reach both Home Layout: One level     Bathroom Shower/Tub: Occupational psychologist: Standard     Home Equipment: Environmental consultant - 2 wheels   Additional Comments: rw is left over from diabetic event with balance deficits afterward/ fish tank that he  takes care of      Prior Functioning/Environment Level of Independence: Independent        Comments: driving, retired   Frequency  Min 2X/week        Progress Toward Goals  OT Goals(current goals can now be found in the care plan section)  Progress towards OT goals: Progressing toward goals  Acute Rehab OT Goals Patient Stated Goal: work on balance OT Goal Formulation: With patient Time For Goal Achievement: 11/02/18 Potential to Achieve Goals: Good ADL Goals Pt Will Perform Grooming: with modified independence;standing Pt Will Perform Lower Body Bathing: with modified independence;sit to/from stand Pt Will Perform Lower Body Dressing: with modified independence;sit to/from stand Pt Will Transfer to Toilet: with modified independence;ambulating Pt Will Perform Toileting - Clothing Manipulation and hygiene: with modified independence;sit to/from stand Pt/caregiver will Perform Home Exercise Program: Increased strength;Both right and left upper extremity;With written HEP provided Additional ADL Goal #1: Pt will independently demonstrate visual compensatory strategies during functional task.  Plan Discharge plan remains appropriate    Co-evaluation    PT/OT/SLP Co-Evaluation/Treatment: Yes Reason for Co-Treatment: Complexity of the patient's impairments (multi-system involvement);Necessary to address cognition/behavior during functional activity;For patient/therapist safety;To address functional/ADL transfers PT goals addressed during session: Mobility/safety with mobility;Balance;Proper use of DME OT goals addressed during session: ADL's and self-care;Proper use of Adaptive equipment and DME;Strengthening/ROM      AM-PAC OT "6 Clicks" Daily Activity     Outcome Measure   Help from another person eating meals?: None Help from another person taking care of personal grooming?: A Little Help from another person toileting, which includes using toliet, bedpan, or urinal?:  A Lot Help from another person bathing (including washing, rinsing, drying)?: A Lot Help from another person to put on and taking off regular upper body clothing?: A Little Help from another person to put on and taking off regular lower body clothing?: A Lot 6 Click Score: 16    End of Session Equipment Utilized During Treatment: Rolling walker  OT Visit Diagnosis: Other abnormalities of gait and mobility (R26.89);Unsteadiness on feet (R26.81)   Activity Tolerance Patient tolerated treatment well   Patient Left in chair;with call bell/phone within reach   Nurse Communication Mobility status;Precautions        Time: 0383-3383 OT Time Calculation (min): 31 min  Charges: OT General Charges $OT Visit: 1 Visit OT Treatments $Self Care/Home Management : 8-22 mins   Jeri Modena, OTR/L  Acute Rehabilitation Services Pager: [phone] Office: [phone] .    Jeri Modena 10/20/2018, 3:20 PM

## 2018-10-20 NOTE — Progress Notes (Signed)
Carotid artery duplex and transcranial dopplers have been completed. Preliminary results can be found in CV Proc through chart review.   10/20/18 12:03 PM [name] RVT

## 2018-10-20 NOTE — Progress Notes (Signed)
   Subjective: Mr. Santistevan was feeling somewhat improved today. Still endorses double vision, altered taste and decreased sensation to the left side of his face. Denies any fevers, chills, urinary symptoms, n/v, or abdominal pain. We explained that his symptoms were caused by a stroke and what medications he will need to be on going forward. Discussed plan for possible inpatient rehab. All other questions and concerns were addressed.   Objective:  Vital signs in last 24 hours: Vitals:   10/19/18 2335 10/20/18 0323 10/20/18 0742 10/20/18 1217  BP: 134/82 (!) 159/90 (!) 148/78 129/83  Pulse: 60 61 (!) 59 61  Resp: 18 14 20 20   Temp: 98.6 F (37 C) 98.5 F (36.9 C) 98.8 F (37.1 C) 98.9 F (37.2 C)  TempSrc: Oral Oral Oral Oral  SpO2: 100% 100% 98% 99%  Weight:      Height:       General: awake, alert, sitting up in bed in NAD CV: RRR Pulm: normal work of breathing on room air; lungs CTAB Abd: BS+; abdomen soft, non-tender, non-distended Neuro: A&Ox3; speech fluent and coherent, answers questions appropriately. Cranial nerve exam reveals altered sensation to all branches of trigeminal nerve on left and subjective diplopia. Strength intact in bilateral upper and lower extremities. Mild dysmetria with finger-to-nose. Rapid alternating movements intact.    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   1. Acute Pontine CVA - initial CT head reveals left pontine lacunar infarct of indeterminate chronicity; unable to get MRI due to pacemaker. Repeating CT head to confirm acute infarct - TCDs consistent with diffuse intracranial  atherosclerosis - TTE and unremarkable and no significant stenosis on carotid dopplers  - LDL 161; adjusted home statin to high intensity - A1C 7.4; on home Humalog - given that he was on daily aspirin on presentation, will treat with DAPT x3 weeks followed by Plavix alone - appreciate neurology following - PT/OT recommending CIR; will place consult for evaluation    2. HTN - allowing permissive HTN in setting of above - holding home Amlodipine, Losartan-HCTZ  3. Asymptomatic Bacteriuria - urine cx pending - treating empirically with Rocephin  4. HFpEF    Complete heart block s/p pacemaker - euvolemic on exam - holding home Lasix due to AKI  5. AKI on CKD III - slight bump in creatinine from yesterday - holding home Lasix - encouraged PO intake - will continue to monitor  6. Fabry disease - nephrologist recommended holding dose of Fabrazyme infusion in the setting of acute illness with plan to resume normal schedule in 2 weeks     Dispo: Anticipated discharge pending CIR evaluation.   Modena Nunnery D, DO 10/20/2018, 2:23 PM Pager: [phone]

## 2018-10-20 NOTE — [place] FL2 (Signed)
[place] LEVEL OF CARE SCREENING TOOL     IDENTIFICATION  Patient Name: [name] Birthdate: 20-Jun-1960 Sex: male Admission Date (Current Location): 10/19/2018  [facility] and [place] Number:  Herbalist and Address:  [facility], [place] [address] [place], [place] [address]      Provider Number: [id number]  Attending Physician Name and Address:  Oda Kilts, MD  Relative Name and Phone Number:       Current Level of Care: Hospital Recommended Level of Care: [place] Prior Approval Number:    Date Approved/Denied:   PASRR Number: [id number]  Discharge Plan: SNF    Current Diagnoses: Patient Active Problem List   Diagnosis Date Noted  . UTI (urinary tract infection) 10/19/2018  . Complete heart block ([place]) 08/01/2018  . Upper airway cough syndrome 08/01/2018  . Lymphedema 10/06/2016  . Gout, arthritis   . 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 Onychomycosis of toenail 09/26/2014  . Diabetes mellitus type 2 in obese ([place])   . Iron deficiency anemia 07/29/2014  . CKD (chronic kidney disease), stage III ([place])   . Chronic diastolic HF (heart failure) ([place]) 03/27/2013  . Peripheral edema 07/20/2012  . Obstructive sleep apnea, suspected 06/21/2012  . Pacemaker-St.Jude 02/23/2012  . Hypertension 08/09/2011  . COPD with asthma ([place]) 08/09/2011  . Mobitz type II atrioventricular block 05/19/2011  . 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    Orientation RESPIRATION BLADDER Height & Weight     Self, Time, Situation, Place  Normal Continent Weight: 132.9 kg Height:  5\' 10"  (177.8 cm)  BEHAVIORAL SYMPTOMS/MOOD NEUROLOGICAL BOWEL NUTRITION STATUS      Continent Diet(heart healthy/ Carb modified diet)  AMBULATORY STATUS COMMUNICATION OF NEEDS Skin   Limited Assist Verbally Other (Comment)(Unna Boots)                       Personal Care Assistance Level of Assistance  Bathing, Feeding, Dressing Bathing  Assistance: Limited assistance Feeding assistance: Independent Dressing Assistance: Limited assistance     Functional Limitations Info  Sight, Hearing, Speech Sight Info: Impaired(having issues with his vision) Hearing Info: Adequate Speech Info: Adequate    SPECIAL CARE FACTORS FREQUENCY  PT (By licensed PT), OT (By licensed OT)     PT Frequency: 5x/ wk OT Frequency: 5x/ wk            Contractures Contractures Info: Not present    Additional Factors Info  Code Status, Allergies, Insulin Sliding Scale Code Status Info: Full Allergies Info: Lisinopril and Shell fish   Insulin Sliding Scale Info: Novolog 70/30 insulin 17 units SQ twice a day       Current Medications (10/20/2018):  This is the current hospital active medication list Current Facility-Administered Medications  Medication Dose Route Frequency Provider Last Rate Last Dose  .  stroke: mapping our early stages of recovery book   Does not apply Once Greta Doom, MD      . 0.9 %  sodium chloride infusion   Intravenous Q14 Days Donato Heinz, MD      . acetaminophen (TYLENOL) tablet 650 mg  650 mg Oral Q6H PRN Velna Ochs, MD       Or  . acetaminophen (TYLENOL) suppository 650 mg  650 mg Rectal Q6H PRN Velna Ochs, MD      . allopurinol (ZYLOPRIM) tablet 300 mg  300 mg Oral Daily Velna Ochs, MD   300 mg at 10/20/18 0857  .  amLODipine (NORVASC) tablet 10 mg  10 mg Oral Daily Velna Ochs, MD   10 mg at 10/20/18 0800  . aspirin EC tablet 81 mg  81 mg Oral Daily Velna Ochs, MD   81 mg at 10/20/18 0858  . atorvastatin (LIPITOR) tablet 80 mg  80 mg Oral q1800 Katherine Roan, MD      . cefTRIAXone (ROCEPHIN) 1 g in sodium chloride 0.9 % 100 mL IVPB  1 g Intravenous Q24H Dorrell, Andree Elk, MD      . enoxaparin (LOVENOX) injection 40 mg  40 mg Subcutaneous Q24H Velna Ochs, MD   40 mg at 10/19/18 1216  . insulin aspart protamine- aspart (NOVOLOG MIX 70/30) injection  17 Units  17 Units Subcutaneous BID AC Winfrey, William B, MD      . ondansetron [facility]) injection 4 mg  4 mg Intravenous Q6H PRN Isabelle Course, MD       Or  . ondansetron (ZOFRAN-ODT) disintegrating tablet 4 mg  4 mg Oral Q6H PRN Isabelle Course, MD   4 mg at 10/19/18 1424     Discharge Medications: Please see discharge summary for a list of discharge medications.  Relevant Imaging Results:  Relevant Lab Results:   Additional Information SS#: [id number]  Pollie Friar, RN

## 2018-10-20 NOTE — TOC Initial Note (Signed)
Transition of Care [facility]) - Initial/Assessment Note    Patient Details  Name: [name] MRN: [id number] Date of Birth: 11/15/1960  [facility] CM/SW Contact:    Pollie Friar, RN Phone Number: 10/20/2018, 11:41 AM  Clinical Narrative:                 Recommendations are for CIR. Pt may not qualify d/t diagnosis. CM met with the patient and he is agreeable to SNF rehab if CIR is not an option. CM completed FL2 and faxed him out in [facility] per his request.  CM will also provide him with SCAT information for after a rehab stay.  Pt has a friend Arbie Cookey, that can provide some supervision and assistance but not 24 hour.  Pt was driving prior to d/c.  He denies issues with his meds.  Pt has on [employer]. States hasn't been able to have [facility] services in the past for these.   Expected Discharge Plan: IP Rehab Facility Barriers to Discharge: Continued Medical Work up   Patient Goals and CMS Choice Patient states their goals for this hospitalization and ongoing recovery are:: to have his vision better CMS Medicare.gov Compare Post Acute Care list provided to:: Patient Choice offered to / list presented to : Patient  Expected Discharge Plan and Services Expected Discharge Plan: [place]       Living arrangements for the past 2 months: Single Family Home(house one level)                                      Prior Living Arrangements/Services Living arrangements for the past 2 months: Single Family Home(house one level) Lives with:: Self Patient language and need for interpreter reviewed:: Yes(no needs) Do you feel safe going back to the place where you live?: Yes      Need for Family Participation in Patient Care: Yes (Comment) Care giver support system in place?: Yes (comment)(has some support but no 24 hour supervison)   Criminal Activity/Legal Involvement Pertinent to Current Situation/Hospitalization: No - Comment as needed  Activities  of Daily Living Home Assistive Devices/Equipment: None ADL Screening (condition at time of admission) Patient's cognitive ability adequate to safely complete daily activities?: Yes Is the patient deaf or have difficulty hearing?: No Does the patient have difficulty seeing, even when wearing glasses/contacts?: No Does the patient have difficulty concentrating, remembering, or making decisions?: No Patient able to express need for assistance with ADLs?: No Does the patient have difficulty dressing or bathing?: No Independently performs ADLs?: Yes (appropriate for developmental age) Does the patient have difficulty walking or climbing stairs?: Yes Weakness of Legs: Both Weakness of Arms/Hands: None  Permission Sought/Granted                  Emotional Assessment Appearance:: Appears stated age Attitude/Demeanor/Rapport: Engaged Affect (typically observed): Accepting, Appropriate, Pleasant Orientation: : Oriented to Self, Oriented to Place, Oriented to  Time, Oriented to Situation   Psych Involvement: No (comment)  Admission diagnosis:  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 UTI (urinary tract infection) 10/19/2018  . Complete heart block ([place]) 08/01/2018  . Upper airway cough syndrome 08/01/2018  . Lymphedema 10/06/2016  . Gout, arthritis   . 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 Onychomycosis  of toenail 09/26/2014  . Diabetes mellitus type 2 in obese ([place])   . Iron deficiency anemia 07/29/2014  . CKD (chronic kidney disease), stage III ([place])   . Chronic diastolic HF (heart failure) ([place]) 03/27/2013  . Peripheral edema 07/20/2012  . Obstructive sleep apnea, suspected 06/21/2012  . Pacemaker-St.Jude 02/23/2012  . Hypertension 08/09/2011  . COPD with asthma ([place]) 08/09/2011  . Mobitz type II atrioventricular  block 05/19/2011  . 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

## 2018-10-20 NOTE — Progress Notes (Signed)
  Echocardiogram 2D Echocardiogram has been performed.  [name] 10/20/2018, 11:11 AM

## 2018-10-20 NOTE — Progress Notes (Signed)
SLP Cancellation Note  Patient Details Name: ZEBEDIAH BEEZLEY MRN: [id number] DOB: [birth year]   Cancelled treatment:       Reason Eval/Treat Not Completed: Patient at procedure or test/unavailable(Pt just received lunch. It was agreed that SLP will return. )  Tobie Poet I. Hardin Negus, [place], [place] Office number [phone] Pager [place] 10/20/2018, 12:20 PM

## 2018-10-21 DIAGNOSIS — G4733 Obstructive sleep apnea (adult) (pediatric): Secondary | ICD-10-CM

## 2018-10-21 DIAGNOSIS — E7521 Fabry (-Anderson) disease: Secondary | ICD-10-CM

## 2018-10-21 DIAGNOSIS — E1159 Type 2 diabetes mellitus with other circulatory complications: Secondary | ICD-10-CM

## 2018-10-21 DIAGNOSIS — I10 Essential (primary) hypertension: Secondary | ICD-10-CM

## 2018-10-21 DIAGNOSIS — N183 Chronic kidney disease, stage 3 (moderate): Secondary | ICD-10-CM

## 2018-10-21 DIAGNOSIS — E785 Hyperlipidemia, unspecified: Secondary | ICD-10-CM

## 2018-10-21 DIAGNOSIS — N3000 Acute cystitis without hematuria: Secondary | ICD-10-CM

## 2018-10-21 DIAGNOSIS — Z794 Long term (current) use of insulin: Secondary | ICD-10-CM

## 2018-10-21 DIAGNOSIS — I6302 Cerebral infarction due to thrombosis of basilar artery: Secondary | ICD-10-CM

## 2018-10-21 DIAGNOSIS — R739 Hyperglycemia, unspecified: Secondary | ICD-10-CM

## 2018-10-21 DIAGNOSIS — R8271 Bacteriuria: Secondary | ICD-10-CM

## 2018-10-21 LAB — BASIC METABOLIC PANEL
Anion gap: 11 (ref 5–15)
BUN: 35 mg/dL — ABNORMAL HIGH (ref 6–20)
CO2: 24 mmol/L (ref 22–32)
Calcium: 8.7 mg/dL — ABNORMAL LOW (ref 8.9–10.3)
Chloride: 107 mmol/L (ref 98–111)
Creatinine, Ser: 2.21 mg/dL — ABNORMAL HIGH (ref 0.61–1.24)
GFR calc Af Amer: 37 mL/min — ABNORMAL LOW (ref >60)
GFR calc non Af Amer: 32 mL/min — ABNORMAL LOW (ref >60)
Glucose, Bld: 77 mg/dL (ref 70–99)
Potassium: 3.8 mmol/L (ref 3.5–5.1)
Sodium: 142 mmol/L (ref 135–145)

## 2018-10-21 LAB — GLUCOSE, CAPILLARY
Glucose-Capillary: 71 mg/dL (ref 70–99)
Glucose-Capillary: 160 mg/dL — ABNORMAL HIGH (ref 70–99)
Glucose-Capillary: 148 mg/dL — ABNORMAL HIGH (ref 70–99)
Glucose-Capillary: 145 mg/dL — ABNORMAL HIGH (ref 70–99)

## 2018-10-21 MED ORDER — INSULIN ASPART PROT & ASPART (70-30 MIX) 100 UNIT/ML ~~LOC~~ SUSP
16.0000 [IU] | Freq: Two times a day (BID) | SUBCUTANEOUS | Status: DC
  Administered 2018-10-21 – 2018-10-23 (×4): 16 [IU] via SUBCUTANEOUS
  Filled 2018-10-21: qty 10

## 2018-10-21 NOTE — Progress Notes (Signed)
Internal Medicine Teaching Service Attending:   I saw and examined the patient. I reviewed the resident's note and I agree with the resident's findings and plan as documented in the resident's note.  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  He is doing well today with some improvement of his neurologic deficits, though he still does have some numbness at his face and diplopia.  Greatly appreciate stroke team consultation.  We are medically managing with aspirin and Plavix for 3 weeks, as well as high intensity statin.  PT and OT are recommending CIR which we are trying to arrange.  Lalla Brothers, MD FACP

## 2018-10-21 NOTE — Progress Notes (Signed)
   Subjective: No acute events overnight. [name] was feeling well today and requested to get out of bed so that he could freshen up some. He worked well with therapy yesterday with improvement in his dizziness. Still having double vision which improved with taped glasses that OT has provided him, as well as altered sensation and taste on the left side of his face.   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   1. Acute Pont[place] CVA - initial CT head reveals left pontine lacunar infarct of indeterminate chronicity; unable to get MRI due to pacemaker. Repeat CT head did not show any new or evolving acute infarct - TCDs consistent with diffuse intracranial atherosclerosis - TTE unremarkable and no significant stenosis on carotid dopplers  - LDL 161; adjusted home statin to high intensity - A1C 7.4; on home Humalog - given that he was on daily aspirin on presentation, will treat with DAPT x3 weeks followed by Plavix alone - appreciate neurology following - PT/OT recommending CIR;  awaiting physician evaluation   2. HTN - allowing permissive HTN in setting of above - holding home Amlodipine, Losartan-HCTZ  3. Asymptomatic Bacteriuria - urine cx negative thus far; will d/c antibiotics since he is asymptomatic and weakness more related to CVA   4. HFpEF    Complete heart block s/p pacemaker - euvolemic on exam - holding home Lasix due to AKI  5. AKI on CKD III - creatinine remains stable - holding home Lasix - will continue to monitor  6. Fabry disease - nephrologist recommended holding dose of Fabrazyme infusion in the setting of acute illness with plan to resume normal schedule in 2 weeks   Dispo: Anticipated discharge pending evaluation for CIR.   Modena Nunnery D, DO 10/21/2018, 6:29 AM Pager: [phone]

## 2018-10-21 NOTE — Progress Notes (Signed)
Physical Therapy Treatment Patient Details Name: [name] MRN: [id number] DOB: [birth year] Today's Date: 10/21/2018    History of Present Illness Pt is a 58 y/o male admitted secondary to gait instability. CT of the brain was negative for any acute findings. MRI unable to be obtained due to pacemaker. Further Neuro work-up pending. PMH including but not limited to CHF, DM, HTN, CKD and Fabry's disease.    PT Comments    Today's skilled session focused on gait and balance reactions. Pt continues to have left lateral balance issues with mobility. Continue to feel pt would benefit from CIR stay prior to d/c home. Acute PT to continue during pt's hospital stay.    Follow Up Recommendations  CIR;Supervision/Assistance - 24 hour     Equipment Recommendations  None recommended by PT    Precautions / Restrictions Precautions Precautions: Fall Precaution Comments: tends to have left lateral balance losses, improved with todays session vs with previous sessions Restrictions Weight Bearing Restrictions: No    Mobility  Bed Mobility   Bed Mobility: Supine to Sit;Sit to Supine     Supine to sit: Supervision Sit to supine: Supervision   General bed mobility comments: bed flat with no rails used. increased time/effort needed.  Transfers Overall transfer level: Needs assistance Equipment used: Rolling walker (2 wheeled) Transfers: Sit to/from Stand Sit to Stand: Min assist;Total assist         General transfer comment: needs assist to stabilize with standing both times. total assist with sitting to bed after completing 180* turn from gait due to left lateral balance loss. min assist with second sit down after balance activities.   Ambulation/Gait Ambulation/Gait assistance: Mod assist Gait Distance (Feet): 30 Feet Assistive device: Rolling walker (2 wheeled) Gait Pattern/deviations: Step-to pattern;Narrow base of support;Ataxic;Decreased stride length;Decreased weight shift  to right;Decreased stance time - right Gait velocity: decreased   General Gait Details: mod assist consistenly throughout gait today. cues to bring left foot out from center for wider base of support and improved balacne. cues/facilitation for weight shifting with gait. one loss of balance toward left after turning to sit on bed needing total assist to sit safety on bed surface.     Modified Rankin (Stroke Patients Only) Modified Rankin (Stroke Patients Only) Pre-Morbid Rankin Score: No significant disability Modified Rankin: Moderately severe disability     Balance Overall balance assessment: Needs assistance;Modified Independent Sitting-balance support: No upper extremity supported;Feet supported Sitting balance-Leahy Scale: Good     Standing balance support: During functional activity;Bilateral upper extremity supported Standing balance-Leahy Scale: Poor Standing balance comment: standing next to bed with UE support on RW: with wide stance (hip width apart) worked on lateral weight shifting with emphasis on return to midline, progressed to static standing with equal weight bearing for EC 30 sec's x3. then progressed to upper trunk rotation with reaching behind him (same side) x 5 reps each way with cues for slow, controlled movements to assist with maintaining balance.           Cognition Arousal/Alertness: Awake/alert Behavior During Therapy: WFL for tasks assessed/performed Overall Cognitive Status: Impaired/Different from baseline Area of Impairment: Problem solving;Safety/judgement               Safety/Judgement: Decreased awareness of deficits   Problem Solving: Difficulty sequencing;Requires verbal cues        General Comments General comments (skin integrity, edema, etc.): pt with lymphadema wraps present on bil LE's which prevent shoes from being fully donned. wears them as  slip on with heels folded in.       Pertinent Vitals/Pain Pain Assessment: No/denies  pain     PT Goals (current goals can now be found in the care plan section) Acute Rehab PT Goals Patient Stated Goal: work on balance PT Goal Formulation: With patient Time For Goal Achievement: 11/02/18 Potential to Achieve Goals: Good Progress towards PT goals: Progressing toward goals    Frequency    Min 5X/week      PT Plan Current plan remains appropriate    AM-PAC PT "6 Clicks" Mobility   Outcome Measure  Help needed turning from your back to your side while in a flat bed without using bedrails?: None Help needed moving from lying on your back to sitting on the side of a flat bed without using bedrails?: None Help needed moving to and from a bed to a chair (including a wheelchair)?: A Lot Help needed standing up from a chair using your arms (e.g., wheelchair or bedside chair)?: A Lot Help needed to walk in hospital room?: A Lot Help needed climbing 3-5 steps with a railing? : Total 6 Click Score: 15    End of Session Equipment Utilized During Treatment: Gait belt Activity Tolerance: Patient tolerated treatment well Patient left: in bed;with call bell/phone within reach;with bed alarm set Nurse Communication: Mobility status PT Visit Diagnosis: Unsteadiness on feet (R26.81);Other abnormalities of gait and mobility (R26.89);Ataxic gait (R26.0);History of falling (Z91.81)     Time: 3267-1245 PT Time Calculation (min) (ACUTE ONLY): 17 min  Charges:  $Gait Training: 8-22 mins                    Willow Ora, PTA, [facility] [employer] [phone] 10/21/18, 12:51 PM  Willow Ora 10/21/2018, 12:49 PM

## 2018-10-21 NOTE — Progress Notes (Signed)
STROKE TEAM PROGRESS NOTE   INTERVAL HISTORY Patient lying in bed, no new complaints, still has vertical diplopia in the central visual field.  OT recommended occluder glasses, patient felt they have helped.  Still has left facial numbness but denies any arm or leg weakness or numbness.  Vitals:   10/20/18 2335 10/21/18 0421 10/21/18 0700 10/21/18 1124  BP: (!) 142/78 (!) 145/83 (!) 156/81 (!) 153/85  Pulse: (!) 59 60 60 61  Resp: 16 16 17 16   Temp: 98.7 F (37.1 C) 98.6 F (37 C) 99 F (37.2 C) 98.7 F (37.1 C)  TempSrc: Oral Oral Oral Axillary  SpO2: 98% 98% 99% 94%  Weight:      Height:        CBC:  Recent Labs  Lab 10/19/18 0606  WBC 7.2  NEUTROABS 4.4  HGB 15.9  HCT 48.9  MCV 91.1  PLT 263    Basic Metabolic Panel:  Recent Labs  Lab 10/20/18 0324 10/21/18 0435  NA 142 142  K 3.8 3.8  CL 107 107  CO2 25 24  GLUCOSE 87 77  BUN 29* 35*  CREATININE 2.30* 2.21*  CALCIUM 8.9 8.7*   Lipid Panel:     Component Value Date/Time   CHOL 232 (H) 10/20/2018 0324   CHOL 280 (H) 10/06/2016 1038   TRIG 89 10/20/2018 0324   HDL 53 10/20/2018 0324   HDL 59 10/06/2016 1038   CHOLHDL 4.4 10/20/2018 0324   VLDL 18 10/20/2018 0324   LDLCALC 161 (H) 10/20/2018 0324   LDLCALC 201 (H) 10/06/2016 1038   HgbA1c:  Lab Results  Component Value Date   HGBA1C 7.4 (H) 10/20/2018   Urine Drug Screen:     Component Value Date/Time   LABOPIA NONE DETECTED 10/19/2018 0641   COCAINSCRNUR NONE DETECTED 10/19/2018 0641   LABBENZ NONE DETECTED 10/19/2018 0641   AMPHETMU NONE DETECTED 10/19/2018 0641   THCU NONE DETECTED 10/19/2018 0641   LABBARB NONE DETECTED 10/19/2018 0641    Alcohol Level No results found for: [facility]   Transthoracic Echocardiogram 10/20/2018 IMPRESSIONS  1. The left ventricle has normal systolic function, with an ejection fraction of 55-60%. The cavity size was normal. There is moderately increased left ventricular wall thickness. Left ventricular diastolic  Doppler parameters are consistent with  impaired relaxation. Elevated left ventricular end-diastolic pressure The E/e' is >15. There is abnormal septal motion consistent with left bundle branch block. No evidence of left ventricular regional wall motion abnormalities.  2. The mitral valve is abnormal. Mild thickening of the mitral valve leaflet. There is moderate mitral annular calcification present.  3. The tricuspid valve is grossly normal.  4. The aortic valve is tricuspid. Mild sclerosis of the aortic valve. No stenosis of the aortic valve.   PHYSICAL EXAM Obese middle-aged African-American male currently not in distress. . Afebrile. Head is nontraumatic. Neck is supple without bruit.    Cardiac exam no murmur or gallop. Lungs are clear to auscultation. Distal pulses are well felt.  He has chronic bilateral lower extremity edema due to lymphedema Neurological Exam :  Awake alert oriented to time place and person.  No aphasia apraxia or dysarthria.  Extraocular movements appears full range but has subjective vertical diplopia on looking in front as well as upward gaze.  There is saccadic dysmetria on lateral gaze right greater than left with few beats of end gaze nystagmus.  Facial symmetrical, left facial decreased light touch sensation, 10% of the right.  Tongue midline.  Motor system exam reveals no upper or lower extremity drift, finger-to-nose coordination intact bilaterally.  Sensation in arms and legs symmetrical, gait not tested.  ASSESSMENT/PLAN Mr. MAKSYM PFIFFNER is a 58 y.o. male with history of diabetes, second-degree Mobitz with AV block requiring a pacemaker which is not MRI compatible, lymphedema in the lower extremities, HOCM, Fabry's disease, diastolic heart failure, chronic kidney disease, heart failure, and Bell's palsy presenting with headache, left facial paresthesias and pain behind his eyes.   Stroke:   Likely brainstem lacunar infarct secondary to small vessel disease  source (too small to be seen on CT), could be related to his Fabry's disease  CT head old L pontine lacune new since 2012. No hmg.   Repeat CT no acute stroke  TCD diffuse intracranial atherosclerosis  Carotid Doppler unremarkable  2D Echo - EF 55-60%. No cardiac source of emboli identified.   HIV neg  LDL 161  UDS negative  HgbA1c 7.4  Lovenox 40 mg sq daily for VTE prophylaxis  aspirin 81 mg daily prior to admission, now on aspirin 81 mg and plavix 75 mg daily x 3 weeks, then PLAVIX alone.   Therapy recommendations:  CIR  Disposition:  pending   Febry's disease  Has been following with Dr. Krista Blue at [facility]  On febrazyme infusion every 14 days  On aspirin 81 at home  Now on DAPT  Continue follow-up with Dr. Krista Blue  Hypertension  Stable . Permissive hypertension (OK if < 220/120) but gradually normalize in 2-3 days . Long-term BP goal normotensive  Hyperlipidemia  Home meds: Lipitor 20  Now on Lipitor 80 mg daily  LDL 161, goal < 70  Continue statin at discharge  Diabetes type II Uncontrolled  HgbA1c 7.4, goal < 7.0  Uncontrolled  SSI  CBG monitoring  Close PCP follow-up and better DM control  UTI  UA WBC > 50  On Rocephin  Treatment per primary team  Other Stroke Risk Factors  ETOH use, advised to drink no more than 2 drink(s) a day  Morbid obesity, Body mass index is 42.04 kg/m., recommend weight loss, diet and exercise as appropriate   Family hx stroke (mother)  Chronic diastolic congestive heart failure   Hypertrophic obstructive cardiomyopathy with EF 55 to 60%   Other Active Problems  second-degree Mobitz with AV block requiring a pacemaker which is not MRI compatible  lymphedema in the lower extremities  Hx Bell's Palsy  CKD - Creatininbe - 2.30->2.21  Hx gout  Hx COPD w/ asthma  Hospital day # 2  Neurology will sign off. Please call with questions. Pt will follow up with Dr. Krista Blue at [facility] in about 4 weeks. Thanks for  the consult.  Rosalin Hawking, MD PhD Stroke Neurology 10/21/2018 3:59 PM   To contact Stroke Continuity provider, please refer to [email or web address] After hours, contact General Neurology

## 2018-10-22 LAB — BASIC METABOLIC PANEL
Anion gap: 8 (ref 5–15)
BUN: 29 mg/dL — ABNORMAL HIGH (ref 6–20)
CO2: 26 mmol/L (ref 22–32)
Calcium: 8.7 mg/dL — ABNORMAL LOW (ref 8.9–10.3)
Chloride: 108 mmol/L (ref 98–111)
Creatinine, Ser: 2.05 mg/dL — ABNORMAL HIGH (ref 0.61–1.24)
GFR calc Af Amer: 40 mL/min — ABNORMAL LOW (ref >60)
GFR calc non Af Amer: 35 mL/min — ABNORMAL LOW (ref >60)
Glucose, Bld: 97 mg/dL (ref 70–99)
Potassium: 3.6 mmol/L (ref 3.5–5.1)
Sodium: 142 mmol/L (ref 135–145)

## 2018-10-22 LAB — GLUCOSE, CAPILLARY
Glucose-Capillary: 124 mg/dL — ABNORMAL HIGH (ref 70–99)
Glucose-Capillary: 117 mg/dL — ABNORMAL HIGH (ref 70–99)
Glucose-Capillary: 177 mg/dL — ABNORMAL HIGH (ref 70–99)
Glucose-Capillary: 111 mg/dL — ABNORMAL HIGH (ref 70–99)

## 2018-10-22 LAB — URINE CULTURE: Culture: >=100000 — AB

## 2018-10-22 MED ORDER — AMLODIPINE BESYLATE 10 MG PO TABS
10.0000 mg | ORAL_TABLET | Freq: Every day | ORAL | Status: DC
  Administered 2018-10-22 – 2018-10-23 (×2): 10 mg via ORAL
  Filled 2018-10-22 (×2): qty 1

## 2018-10-22 NOTE — Progress Notes (Signed)
   Subjective: No acute events overnight. No changes in symptoms.  We discussed how he would like to change his code status to full code.  He is also eager to be evaluated by CIR and admitted to inpatient rehab.    Objective:  Vital signs in last 24 hours: Vitals:   10/21/18 1952 10/21/18 2359 10/22/18 0409 10/22/18 0751  BP: 135/70 (!) 164/81 (!) 151/87 132/86  Pulse: 63 60 60 65  Resp: 18 18 17 20   Temp: 98.7 F (37.1 C) 98.7 F (37.1 C) 98.3 F (36.8 C) 98.9 F (37.2 C)  TempSrc: Oral Oral Oral Oral  SpO2: 96% 99% 100% 98%  Weight:      Height:       Cardiac: normal rate and rhythm, clear s1 and s2, no murmurs, rubs or gallops Pulmonary: CTAB, not in distress Abdominal: non distended abdomen, soft and nontender Extremities: bilateral lower extremity 3+ edema, legs wrapped in unna boots Psych: Alert, conversant, in good spirits Neuro: diplopia which improves with occluder glasses.  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   1. Acute Pontine CVA: initial CT head reveals left pontine lacunar infarct of indeterminate chronicity; unable to get MRI due to pacemaker. Repeat CT head did not show any new or evolving acute infarct. TCDs consistent with diffuse intracranial atherosclerosis.  Medical optimization for atherosclerotic disease. TTE unremarkable and no significant stenosis on carotid dopplers  - increased  lipitor to 80mg  daily - given that he was on daily aspirin on presentation, will treat with DAPT x3 weeks followed by Plavix alone - appreciate neurology following - PT/OT recommending CIR; awaiting physician evaluation  - reintegrate bp meds today  2. HTN: on amlodipine, losartan/HCTZ at home -permissive HTN over, start back amlodipine today  3. Asymptomatic Bacteriuria - urine cx negative thus far; will d/c antibiotics since he is asymptomatic and weakness more related to CVA   4. HFpEF    Complete heart block s/p pacemaker - euvolemic on exam - holding home Lasix due to AKI and it is a low dose used for his lymphedema  5. AKI on CKD III - creatinine improving - holding home Lasix as this is for lymphedema which has improved with unna boots - will continue to monitor  6. Fabry disease - nephrologist recommended holding dose of Fabrazyme infusion in the setting of acute illness with plan to resume normal schedule in 2 weeks   Dispo: Anticipated discharge pending evaluation for CIR.   Katherine Roan, MD 10/22/2018, 8:59 AM

## 2018-10-23 ENCOUNTER — Encounter (HOSPITAL_COMMUNITY): Payer: Self-pay | Admitting: Physical Medicine and Rehabilitation

## 2018-10-23 ENCOUNTER — Other Ambulatory Visit: Payer: Self-pay

## 2018-10-23 ENCOUNTER — Encounter (HOSPITAL_COMMUNITY): Payer: Self-pay

## 2018-10-23 ENCOUNTER — Inpatient Hospital Stay (HOSPITAL_COMMUNITY)
Admission: RE | Admit: 2018-10-23 | Discharge: 2018-11-02 | DRG: 057 | Disposition: A | Discharge status: Home Health | Payer: Medicare Other | Source: Intra-hospital | Attending: Physical Medicine & Rehabilitation | Admitting: Physical Medicine & Rehabilitation

## 2018-10-23 DIAGNOSIS — I10 Essential (primary) hypertension: Secondary | ICD-10-CM | POA: Diagnosis present

## 2018-10-23 DIAGNOSIS — M25462 Effusion, left knee: Secondary | ICD-10-CM | POA: Diagnosis present

## 2018-10-23 DIAGNOSIS — J4489 Other specified chronic obstructive pulmonary disease: Secondary | ICD-10-CM | POA: Diagnosis present

## 2018-10-23 DIAGNOSIS — H532 Diplopia: Secondary | ICD-10-CM | POA: Diagnosis present

## 2018-10-23 DIAGNOSIS — B958 Unspecified staphylococcus as the cause of diseases classified elsewhere: Secondary | ICD-10-CM | POA: Diagnosis present

## 2018-10-23 DIAGNOSIS — G463 Brain stem stroke syndrome: Secondary | ICD-10-CM | POA: Diagnosis not present

## 2018-10-23 DIAGNOSIS — N390 Urinary tract infection, site not specified: Secondary | ICD-10-CM | POA: Diagnosis present

## 2018-10-23 DIAGNOSIS — E1169 Type 2 diabetes mellitus with other specified complication: Secondary | ICD-10-CM

## 2018-10-23 DIAGNOSIS — I441 Atrioventricular block, second degree: Secondary | ICD-10-CM | POA: Diagnosis present

## 2018-10-23 DIAGNOSIS — M109 Gout, unspecified: Secondary | ICD-10-CM | POA: Diagnosis present

## 2018-10-23 DIAGNOSIS — J449 Chronic obstructive pulmonary disease, unspecified: Secondary | ICD-10-CM | POA: Diagnosis present

## 2018-10-23 DIAGNOSIS — E785 Hyperlipidemia, unspecified: Secondary | ICD-10-CM | POA: Diagnosis present

## 2018-10-23 DIAGNOSIS — I69398 Other sequelae of cerebral infarction: Secondary | ICD-10-CM | POA: Diagnosis not present

## 2018-10-23 DIAGNOSIS — R600 Localized edema: Secondary | ICD-10-CM | POA: Diagnosis present

## 2018-10-23 DIAGNOSIS — Z950 Presence of cardiac pacemaker: Secondary | ICD-10-CM | POA: Diagnosis not present

## 2018-10-23 DIAGNOSIS — R269 Unspecified abnormalities of gait and mobility: Secondary | ICD-10-CM | POA: Diagnosis not present

## 2018-10-23 DIAGNOSIS — N183 Chronic kidney disease, stage 3 unspecified: Secondary | ICD-10-CM | POA: Diagnosis present

## 2018-10-23 DIAGNOSIS — Z823 Family history of stroke: Secondary | ICD-10-CM | POA: Diagnosis not present

## 2018-10-23 DIAGNOSIS — I69331 Monoplegia of upper limb following cerebral infarction affecting right dominant side: Secondary | ICD-10-CM

## 2018-10-23 DIAGNOSIS — Z6841 Body Mass Index (BMI) 40.0 and over, adult: Secondary | ICD-10-CM | POA: Diagnosis not present

## 2018-10-23 DIAGNOSIS — IMO0002 Reserved for concepts with insufficient information to code with codable children: Secondary | ICD-10-CM

## 2018-10-23 DIAGNOSIS — E1165 Type 2 diabetes mellitus with hyperglycemia: Secondary | ICD-10-CM

## 2018-10-23 DIAGNOSIS — Z7982 Long term (current) use of aspirin: Secondary | ICD-10-CM

## 2018-10-23 DIAGNOSIS — E7521 Fabry (-Anderson) disease: Secondary | ICD-10-CM | POA: Diagnosis present

## 2018-10-23 DIAGNOSIS — I130 Hypertensive heart and chronic kidney disease with heart failure and stage 1 through stage 4 chronic kidney disease, or unspecified chronic kidney disease: Secondary | ICD-10-CM | POA: Diagnosis present

## 2018-10-23 DIAGNOSIS — I69393 Ataxia following cerebral infarction: Secondary | ICD-10-CM | POA: Diagnosis present

## 2018-10-23 DIAGNOSIS — Z794 Long term (current) use of insulin: Secondary | ICD-10-CM

## 2018-10-23 DIAGNOSIS — E1122 Type 2 diabetes mellitus with diabetic chronic kidney disease: Secondary | ICD-10-CM | POA: Diagnosis present

## 2018-10-23 DIAGNOSIS — I5032 Chronic diastolic (congestive) heart failure: Secondary | ICD-10-CM | POA: Diagnosis present

## 2018-10-23 DIAGNOSIS — E669 Obesity, unspecified: Secondary | ICD-10-CM

## 2018-10-23 DIAGNOSIS — R609 Edema, unspecified: Secondary | ICD-10-CM | POA: Diagnosis present

## 2018-10-23 LAB — GLUCOSE, CAPILLARY
Glucose-Capillary: 102 mg/dL — ABNORMAL HIGH (ref 70–99)
Glucose-Capillary: 97 mg/dL (ref 70–99)
Glucose-Capillary: 182 mg/dL — ABNORMAL HIGH (ref 70–99)
Glucose-Capillary: 103 mg/dL — ABNORMAL HIGH (ref 70–99)

## 2018-10-23 MED ORDER — CLOPIDOGREL BISULFATE 75 MG PO TABS
75.0000 mg | ORAL_TABLET | Freq: Every day | ORAL | Status: DC
  Administered 2018-10-24 – 2018-11-02 (×10): 75 mg via ORAL
  Filled 2018-10-23 (×10): qty 1

## 2018-10-23 MED ORDER — INSULIN ASPART 100 UNIT/ML ~~LOC~~ SOLN
0.0000 [IU] | Freq: Every day | SUBCUTANEOUS | Status: DC
  Administered 2018-10-26: 22:00:00 2 [IU] via SUBCUTANEOUS

## 2018-10-23 MED ORDER — DIPHENHYDRAMINE HCL 12.5 MG/5ML PO ELIX: 12.5000 mg | ORAL_SOLUTION | Freq: Four times a day (QID) | ORAL | Status: DC | PRN

## 2018-10-23 MED ORDER — LORATADINE 10 MG PO TABS
10.0000 mg | ORAL_TABLET | Freq: Every day | ORAL | Status: DC
  Administered 2018-10-24 – 2018-11-01 (×9): 10 mg via ORAL
  Filled 2018-10-23 (×10): qty 1

## 2018-10-23 MED ORDER — PROCHLORPERAZINE EDISYLATE 10 MG/2ML IJ SOLN: 5.0000 mg | Freq: Four times a day (QID) | INTRAMUSCULAR | Status: DC | PRN

## 2018-10-23 MED ORDER — ATORVASTATIN CALCIUM 80 MG PO TABS: 80.0000 mg | ORAL_TABLET | Freq: Every day | ORAL | 0 refills | Status: DC

## 2018-10-23 MED ORDER — AMLODIPINE BESYLATE 10 MG PO TABS
10.0000 mg | ORAL_TABLET | Freq: Every day | ORAL | Status: DC
  Administered 2018-10-24 – 2018-11-02 (×10): 10 mg via ORAL
  Filled 2018-10-23 (×10): qty 1

## 2018-10-23 MED ORDER — POLYETHYLENE GLYCOL 3350 17 G PO PACK: 17.0000 g | PACK | Freq: Every day | ORAL | Status: DC | PRN

## 2018-10-23 MED ORDER — GUAIFENESIN-DM 100-10 MG/5ML PO SYRP: 5.0000 mL | ORAL_SOLUTION | Freq: Four times a day (QID) | ORAL | Status: DC | PRN

## 2018-10-23 MED ORDER — ATORVASTATIN CALCIUM 80 MG PO TABS
80.0000 mg | ORAL_TABLET | Freq: Every day | ORAL | Status: DC
  Administered 2018-10-23 – 2018-11-01 (×10): 80 mg via ORAL
  Filled 2018-10-23 (×10): qty 1

## 2018-10-23 MED ORDER — PROCHLORPERAZINE MALEATE 5 MG PO TABS: 5.0000 mg | ORAL_TABLET | Freq: Four times a day (QID) | ORAL | Status: DC | PRN

## 2018-10-23 MED ORDER — FLEET ENEMA 7-19 GM/118ML RE ENEM: 1.0000 | ENEMA | Freq: Once | RECTAL | Status: DC | PRN

## 2018-10-23 MED ORDER — ALUM & MAG HYDROXIDE-SIMETH 200-200-20 MG/5ML PO SUSP: 30.0000 mL | ORAL | Status: DC | PRN

## 2018-10-23 MED ORDER — BISACODYL 10 MG RE SUPP: 10.0000 mg | Freq: Every day | RECTAL | Status: DC | PRN

## 2018-10-23 MED ORDER — ASPIRIN EC 81 MG PO TBEC
81.0000 mg | DELAYED_RELEASE_TABLET | Freq: Every day | ORAL | Status: DC
  Administered 2018-10-24 – 2018-11-02 (×10): 81 mg via ORAL
  Filled 2018-10-23 (×8): qty 1

## 2018-10-23 MED ORDER — CLOPIDOGREL BISULFATE 75 MG PO TABS: 75.0000 mg | ORAL_TABLET | Freq: Every day | ORAL | 0 refills | Status: DC

## 2018-10-23 MED ORDER — TRAZODONE HCL 50 MG PO TABS: 25.0000 mg | ORAL_TABLET | Freq: Every evening | ORAL | Status: DC | PRN

## 2018-10-23 MED ORDER — INSULIN ASPART PROT & ASPART (70-30 MIX) 100 UNIT/ML ~~LOC~~ SUSP
16.0000 [IU] | Freq: Two times a day (BID) | SUBCUTANEOUS | Status: DC
  Administered 2018-10-23 – 2018-10-27 (×7): 16 [IU] via SUBCUTANEOUS
  Filled 2018-10-23: qty 10

## 2018-10-23 MED ORDER — SODIUM CHLORIDE 0.9 % IV SOLN: INTRAVENOUS | Status: DC

## 2018-10-23 MED ORDER — ALLOPURINOL 300 MG PO TABS
300.0000 mg | ORAL_TABLET | Freq: Every day | ORAL | Status: DC
  Administered 2018-10-24 – 2018-11-02 (×10): 300 mg via ORAL
  Filled 2018-10-23 (×10): qty 1

## 2018-10-23 MED ORDER — ACETAMINOPHEN 325 MG PO TABS
325.0000 mg | ORAL_TABLET | ORAL | Status: DC | PRN
  Administered 2018-10-23: 325 mg via ORAL
  Administered 2018-10-23 – 2018-10-25 (×4): 650 mg via ORAL
  Administered 2018-10-26: 325 mg via ORAL
  Filled 2018-10-23 (×2): qty 2
  Filled 2018-10-23: qty 1
  Filled 2018-10-23: qty 2
  Filled 2018-10-23: qty 1
  Filled 2018-10-23: qty 2

## 2018-10-23 MED ORDER — INSULIN ASPART 100 UNIT/ML ~~LOC~~ SOLN
0.0000 [IU] | Freq: Three times a day (TID) | SUBCUTANEOUS | Status: DC
  Administered 2018-10-23: 17:00:00 2 [IU] via SUBCUTANEOUS
  Administered 2018-10-24: 18:00:00 1 [IU] via SUBCUTANEOUS
  Administered 2018-10-24: 13:00:00 2 [IU] via SUBCUTANEOUS
  Administered 2018-10-25 – 2018-10-26 (×3): 3 [IU] via SUBCUTANEOUS
  Administered 2018-10-26: 08:00:00 2 [IU] via SUBCUTANEOUS
  Administered 2018-10-27 (×2): 3 [IU] via SUBCUTANEOUS
  Administered 2018-10-27: 12:00:00 2 [IU] via SUBCUTANEOUS
  Administered 2018-10-28 (×3): 1 [IU] via SUBCUTANEOUS
  Administered 2018-10-29: 17:00:00 2 [IU] via SUBCUTANEOUS
  Administered 2018-10-29 – 2018-10-30 (×2): 1 [IU] via SUBCUTANEOUS
  Administered 2018-10-30 – 2018-10-31 (×2): 3 [IU] via SUBCUTANEOUS
  Administered 2018-11-01 – 2018-11-02 (×2): 2 [IU] via SUBCUTANEOUS

## 2018-10-23 MED ORDER — ENOXAPARIN SODIUM 40 MG/0.4ML ~~LOC~~ SOLN
40.0000 mg | SUBCUTANEOUS | Status: DC
  Administered 2018-10-24 – 2018-11-02 (×10): 40 mg via SUBCUTANEOUS
  Filled 2018-10-23 (×10): qty 0.4

## 2018-10-23 MED ORDER — PROCHLORPERAZINE 25 MG RE SUPP: 12.5000 mg | Freq: Four times a day (QID) | RECTAL | Status: DC | PRN

## 2018-10-23 MED ORDER — ALBUTEROL SULFATE (2.5 MG/3ML) 0.083% IN NEBU: 3.0000 mL | INHALATION_SOLUTION | Freq: Four times a day (QID) | RESPIRATORY_TRACT | Status: DC | PRN

## 2018-10-23 NOTE — Progress Notes (Signed)
Inpatient Rehabilitation Admissions Coordinator  Inpatient Rehab Consult received. I met with patient at the bedside for rehabilitation assessment. We discussed goals and expectations of an inpatient rehab admission.  He is in agreement to admit to inpt rehab today. I have received insurance approval . I contacted Dr. Koleen Distance and will arrange admission today. I have notified RN CM and SW.  Danne Baxter, RN, MSN Rehab Admissions Coordinator [phone] 10/23/2018 11:20 AM

## 2018-10-23 NOTE — PMR Pre-admission (Signed)
PMR Admission Coordinator Pre-Admission Assessment  [name] is an 58 y.o., male MRN: [id number] DOB: 02-26-1961 Height: '5\' 10"'$  (177.8 cm) Weight: 132.9 kg  Insurance Information HMO:     PPO: yes     PCP:      IPA:      80/20:      OTHER: medicare advantage plan PRIMARY: U[employer] Medicare      Policy#: [id number]      Subscriber: pt CM Name: Page Spiro      Phone#: [phone] ext [phone]     Fax#: [phone] Pre-Cert#: [id number] approved for 3 days with f/u Sharee Pimple      Employer: n/a Benefits:  Phone #: [phone]     Name: 5/18 Eff. Date: 06/07/2016     Deduct: none      Out of Pocket Max: $4500/met      Life Max: none CIR: $325 co pay per day days 1 until 5      SNF: no co pay days 1 until 20; $160 co pay per day days 21 until 49; no co pay days 50 until 100 Outpatient: $35 per visit     Co-Pay: visits per medical necessity Home Health: 100%      Co-Pay: visits per medical neccesity DME: 80%     Co-Pay: 20% Providers: in network  SECONDARY: none        Medicaid Application Date:       Case Manager:  Disability Application Date:       Case Worker:   The "Data Collection Information Summary" for patients in Inpatient Rehabilitation Facilities with attached "Privacy Act Friona Records" was provided and verbally reviewed with: Patient  Emergency Contact Information Contact Information    Name Relation Home Work Mobile   Norva Pavlov [phone]  [phone]      Current Medical History  Patient Admitting Diagnosis: none  History of Present Illness:58 year old male with 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 Presented 5/14 with two days of unsteadiness on his feet.   Neuro consulted and felt likely brain stem lacunar infarct secondary to small vessel disease which was too small to be seen on CT scan. Gelt related  to his Fabry's disease. CT old left pontine CVA and repeat CT scan with no acute CVA. TCD diffuse intracranial atherosclerosis, Carotid Doppler unremarkable, 2 d echo EF 55 - 60 % with no cardiac source for emboli., LDL 161, Hgb A1c 7.4. Lovenox for DVT prophylaxis. Placed on ASA 81 mg and plavix 75 mg daily for 3 weeks then plavix alone.   Followed by Dr. Krista Blue at [facility] on Fabrazyme infusion every 14 days.   Stable HTN and to allow permissive HTN and gradually normalize in 2 to 3 days.   Hyperlipidemia with Lipitor increased to 80 mg per day. To follow up with PCP for better DM control.   Complete NIHSS TOTAL: 3  Patient's medical record from [facility]  has been reviewed by the rehabilitation admission coordinator and physician.  Past Medical History  Past Medical History:  Diagnosis Date  . Bell's palsy   . Bifascicular block   . Chronic bronchitis ([place])    "seasonal; get it q yr"  . Chronic diastolic CHF (congestive heart failure) ([place]) 2010  . Chronic kidney disease (CKD), stage II (mild)    stage II to III/notes 07/23/2014  .  Diastolic heart failure secondary to hypertrophic cardiomyopathy ([place]) CARDIOLOGIST-- DR Daneen Schick  . Dyspnea    increased exertion   . Edema 07/2013  . Fabry's disease ([place]) RENAL AND CARDIAC INVOVLEMENT   FOLLOWED DR [place]  . Gout, arthritis 2014   bil feet. right worse  . History of cellulitis of skin with lymphangitis LEFT LEG  . HOCM (hypertrophic obstructive cardiomyopathy) ([place])    a. Echo 11/16: Severe LVH, EF 55-60%, abnormal GLS consistent with HOCM, no SAM, mild LAE  . Hypertension 20 years  . Lymphedema of lower extremity LEFT  >  RIGHT   "using ankle high socks at home; pump doesn't work for me" (07/23/2014)  . Pacemaker    02/12/11  . Pneumonia 08/2011  . Seasonal asthma NO INHALERS   30 years  . Second degree Mobitz II AV block    with syncope, s/p PPM  . Short of breath on exertion   . Type II diabetes mellitus ([place]) 2012    INSULIN DEPENDENT    Family History   family history includes Stroke in his mother.  Prior Rehab/Hospitalizations Has the patient had prior rehab or hospitalizations prior to admission? Yes  Has the patient had major surgery during 100 days prior to admission? No   Current Medications  Current Facility-Administered Medications:  .   stroke: mapping our early stages of recovery book, , Does not apply, Once, Greta Doom, MD .  0.9 %  sodium chloride infusion, , Intravenous, Q14 Days, Donato Heinz, MD .  acetaminophen (TYLENOL) tablet 650 mg, 650 mg, Oral, Q6H PRN **OR** acetaminophen (TYLENOL) suppository 650 mg, 650 mg, Rectal, Q6H PRN, Velna Ochs, MD .  allopurinol (ZYLOPRIM) tablet 300 mg, 300 mg, Oral, Daily, Velna Ochs, MD, 300 mg at 10/23/18 0915 .  amLODipine (NORVASC) tablet 10 mg, 10 mg, Oral, Daily, Katherine Roan, MD, 10 mg at 10/23/18 0915 .  aspirin EC tablet 81 mg, 81 mg, Oral, Daily, Velna Ochs, MD, 81 mg at 10/23/18 0915 .  atorvastatin (LIPITOR) tablet 80 mg, 80 mg, Oral, q1800, Katherine Roan, MD, 80 mg at 10/22/18 1719 .  clopidogrel (PLAVIX) tablet 75 mg, 75 mg, Oral, Daily, Biby, Sharon L, NP, 75 mg at 10/23/18 0915 .  enoxaparin (LOVENOX) injection 40 mg, 40 mg, Subcutaneous, Q24H, Velna Ochs, MD, 40 mg at 10/22/18 1024 .  insulin aspart protamine- aspart (NOVOLOG MIX 70/30) injection 16 Units, 16 Units, Subcutaneous, BID AC, Katherine Roan, MD, 16 Units at 10/23/18 0750 .  ondansetron (ZOFRAN) injection 4 mg, 4 mg, Intravenous, Q6H PRN **OR** ondansetron (ZOFRAN-ODT) disintegrating tablet 4 mg, 4 mg, Oral, Q6H PRN, Isabelle Course, MD, 4 mg at 10/19/18 1424  Patients Current Diet:  Diet Order            Diet heart healthy/carb modified Room service appropriate? Yes with Assist; Fluid consistency: Thin  Diet effective now              Precautions / Restrictions Precautions Precautions:  Fall Precaution Comments: tends to have left lateral balance losses, improved with todays session vs with previous sessions Restrictions Weight Bearing Restrictions: No   Has the patient had 2 or more falls or a fall with injury in the past year? No  Prior Activity Level Limited Community (1-2x/wk): Independent without AD; drove; disabled due to lymphaedema  Prior Functional Level Self Care: Did the patient need help bathing, dressing, using the toilet or eating? Independent  Indoor Mobility: Did the patient  need assistance with walking from room to room (with or without device)? Independent  Stairs: Did the patient need assistance with internal or external stairs (with or without device)? Independent  Functional Cognition: Did the patient need help planning regular tasks such as shopping or remembering to take medications? Independent  Home Assistive Devices / Equipment Home Assistive Devices/Equipment: None Home Equipment: Walker - 2 wheels  Prior Device Use: Indicate devices/aids used by the patient prior to current illness, exacerbation or injury? None of the above  Current Functional Level Cognition  Arousal/Alertness: Awake/alert Overall Cognitive Status: Impaired/Different from baseline Orientation Level: Oriented X4 Safety/Judgement: Decreased awareness of deficits Attention: Focused, Sustained Focused Attention: Appears intact(Vigilance WNL: 1/1) Sustained Attention: Impaired Sustained Attention Impairment: Verbal complex(Serial 7s: 1/3) Memory: Appears intact(Immediate: 5/5; Delayed: 5/5) Awareness: Appears intact Problem Solving: Appears intact(5/5) Executive Function: Reasoning, Sequencing, Organizing Reasoning: Appears intact(Abstraction: 2/2) Sequencing: Impaired Sequencing Impairment: Verbal complex(Clock drawing: 2/3) Organizing: Appears intact(Backward digit span: 1/1)    Extremity Assessment (includes Sensation/Coordination)  Upper Extremity Assessment:  Overall WFL for tasks assessed  Lower Extremity Assessment: Defer to PT evaluation    ADLs  Overall ADL's : Needs assistance/impaired Eating/Feeding: Modified independent, Sitting Grooming: Minimal assistance, Standing(mod (A0 for balance) Grooming Details (indicate cue type and reason): minA for balance Upper Body Bathing: Minimal assistance, Sitting Lower Body Bathing: Minimal assistance, Moderate assistance, Sit to/from stand Upper Body Dressing : Minimal assistance, Sitting Lower Body Dressing: Moderate assistance Lower Body Dressing Details (indicate cue type and reason): pt don shoes with the back folded down due to the edema in bil LE. pt reports searching for 2 years for shoes to fit his feet Toilet Transfer: Minimal assistance, Ambulation, RW Toilet Transfer Details (indicate cue type and reason): simulated via transfer to/from EOB Toileting- Clothing Manipulation and Hygiene: Moderate assistance, Sit to/from stand Functional mobility during ADLs: Minimal assistance, Rolling walker General ADL Comments: pt with decreased balance, weakness, reports of double vision    Mobility  Overal bed mobility: Needs Assistance Bed Mobility: Supine to Sit, Sit to Supine Supine to sit: Supervision Sit to supine: Supervision General bed mobility comments: bed flat with no rails used. increased time/effort needed.    Transfers  Overall transfer level: Needs assistance Equipment used: Rolling walker (2 wheeled) Transfers: Sit to/from Stand Sit to Stand: Min assist, Total assist General transfer comment: needs assist to stabilize with standing both times. total assist with sitting to bed after completing 180* turn from gait due to left lateral balance loss. min assist with second sit down after balance activities.     Ambulation / Gait / Stairs / Wheelchair Mobility  Ambulation/Gait Ambulation/Gait assistance: Mod assist Gait Distance (Feet): 30 Feet Assistive device: Rolling walker (2  wheeled) Gait Pattern/deviations: Step-to pattern, Narrow base of support, Ataxic, Decreased stride length, Decreased weight shift to right, Decreased stance time - right General Gait Details: mod assist consistenly throughout gait today. cues to bring left foot out from center for wider base of support and improved balacne. cues/facilitation for weight shifting with gait. one loss of balance toward left after turning to sit on bed needing total assist to sit safety on bed surface.  Gait velocity: decreased    Posture / Balance Balance Overall balance assessment: Needs assistance, Modified Independent Sitting-balance support: No upper extremity supported, Feet supported Sitting balance-Leahy Scale: Good Standing balance support: During functional activity, Bilateral upper extremity supported Standing balance-Leahy Scale: Poor Standing balance comment: standing next to bed with UE support on RW: with wide  stance (hip width apart) worked on lateral weight shifting with emphasis on return to midline, progressed to static standing with equal weight bearing for EC 30 sec's x3. then progressed to upper trunk rotation with reaching behind him (same side) x 5 reps each way with cues for slow, controlled movements to assist with maintaining balance.     Special needs/care consideration BiPAP/CPAP  N/a CPM  N/a Continuous Drip IV  N/a Dialysis n/a Life Vest  N/a Oxygen  N/a Special Bed  N/a Trach Size  N/a Wound Vac n/a Skin  BLE wraps for lymphedema management Bowel mgmt:  Continent LBM 5/18 Bladder mgmt:  continent Diabetic mgmt:  Hgb A1c 7.4 Behavioral consideration  N/a Chemo/radiation  N/a   Previous Home Environment  Living Arrangements: Alone  Lives With: Alone Available Help at Discharge: Friend(s), Available PRN/intermittently Type of Home: House Home Layout: One level Home Access: Stairs to enter Entrance Stairs-Rails: Right, Left, Can reach both Entrance Stairs-Number of Steps:  2 Bathroom Shower/Tub: Multimedia programmer: Standard Bathroom Accessibility: Yes How Accessible: Accessible via walker Home Care Services: No Additional Comments: rw is left over from diabetic event with balance deficits afterward/ 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  Discharge Living Setting Plans for Discharge Living Setting: Alone Type of Home at Discharge: House Discharge Home Layout: One level Discharge Home Access: Stairs to enter Entrance Stairs-Rails: Right, Left, Can reach both Entrance Stairs-Number of Steps: 2 Discharge Bathroom Shower/Tub: Walk-in shower Discharge Bathroom Toilet: Standard Discharge Bathroom Accessibility: Yes How Accessible: Accessible via walker Does the patient have any problems obtaining your medications?: No  Social/Family/Support Systems Contact Information: family friend Music therapist; sister is in [place] Anticipated Caregiver: friend prn Anticipated Caregiver's Contact Information: see above Caregiver Availability: Intermittent Discharge Plan Discussed with Primary Caregiver: Yes Is Caregiver In Agreement with Plan?: Yes Does Caregiver/Family have Issues with Lodging/Transportation while Pt is in Rehab?: No   Patient's refrigerator was broken for 2 days pta. Pt had placed insulin on ice which is why he felt he was sick initially. New frig to be delivered the 23 rd  Goals/Additional Needs Patient/Family Goal for Rehab: Mod I with PT and OT Expected length of stay: ELOS 10 to 12 days Pt/Family Agrees to Admission and willing to participate: Yes Program Orientation Provided & Reviewed with Pt/Caregiver Including Roles  & Responsibilities: Yes  Decrease burden of Care through IP rehab admission: n/a  Possible need for SNF placement upon discharge:  N/a  Patient Condition: I have reviewed medical records from [facility] , spoken with CSW, and patient. I met with patient at the bedside for inpatient rehabilitation assessment.   Patient will benefit from ongoing PT and OT, can actively participate in 3 hours of therapy a day 5 days of the week, and can make measurable gains during the admission.  Patient will also benefit from the coordinated team approach during an Inpatient Acute Rehabilitation admission.  The patient will receive intensive therapy as well as Rehabilitation physician, nursing, social worker, and care management interventions.  Due to bladder management, bowel management, safety, skin/wound care, disease management, medication administration, pain management and patient education the patient requires 24 hour a day rehabilitation nursing.  The patient is currently  Mod assist with mobility and basic ADLs.  Discharge setting and therapy post discharge at home with home health is anticipated.  Patient has agreed to participate in the Acute Inpatient Rehabilitation Program and will admit today.  Preadmission Screen Completed By:  Julious Payer,  Audelia Acton RN MSN 10/23/2018 11:25 AM ______________________________________________________________________   Discussed status with Dr. Naaman Plummer  on  10/23/2018 at  1139 and received approval for admission today.  Admission Coordinator:  Cleatrice Burke, RN MSN, time  4451 Date  10/23/2018   Assessment/Plan: Diagnosis: brainstem CVA 1. Does the need for close, 24 hr/day Medical supervision in concert with the patient's rehab needs make it unreasonable for this patient to be served in a less intensive setting? Yes 2. Co-Morbidities requiring supervision/potential complications: Fabry disease, HTN, DM, COPD, OSA, CKD 3. Due to bladder management, bowel management, safety, skin/wound care, disease management, medication administration, pain management and patient education, does the patient require 24 hr/day rehab nursing? Yes 4. Does the patient require coordinated care of a physician, rehab nurse, PT (1-2 hrs/day, 5 days/week) and OT (1-2 hrs/day, 5 days/week) to address  physical and functional deficits in the context of the above medical diagnosis(es)? Yes Addressing deficits in the following areas: balance, endurance, locomotion, strength, transferring, bowel/bladder control, bathing, dressing, feeding, grooming, toileting and psychosocial support 5. Can the patient actively participate in an intensive therapy program of at least 3 hrs of therapy 5 days a week? Yes 6. The potential for patient to make measurable gains while on inpatient rehab is excellent 7. Anticipated functional outcomes upon discharge from inpatients are: modified independent PT, modified independent OT, n/a SLP 8. Estimated rehab length of stay to reach the above functional goals is: 10-12 days 9. Anticipated D/C setting: Home 10. Anticipated post D/C treatments: [facility] therapy 11. Overall Rehab/Functional Prognosis: excellent  MD Signature: [name], MD, [facility] 10/23/2018

## 2018-10-23 NOTE — Care Management Important Message (Signed)
Important Message  Patient Details  Name: [name] MRN: [id number] Date of Birth: 1960-08-26   Medicare Important Message Given:  Yes    Orbie Pyo 10/23/2018, 3:08 PM

## 2018-10-23 NOTE — Progress Notes (Signed)
   Subjective: No acute events overnight. Persistent diplopia, but glasses help and he has been able to work well with therapy. Also continues to have left sided facial numbness/tingling which makes eating on the side more of a challenge. I encouraged him that his swallow study with the speech therapists did not show any abnormalities. He looks forward to continuing rehab.   Objective:  Vital signs in last 24 hours: Vitals:   10/22/18 1635 10/22/18 1943 10/23/18 0000 10/23/18 0415  BP: (!) 159/75 (!) 156/72 (!) 149/77 (!) 161/77  Pulse: 60 60 (!) 59 66  Resp: 20 18 18 18   Temp: 98.7 F (37.1 C) 98.5 F (36.9 C) 98.8 F (37.1 C) 98.8 F (37.1 C)  TempSrc: Oral Oral Oral Oral  SpO2: 96%  97% 98%  Weight:      Height:       General: awake, alert, pleasant gentleman, sitting up in bed in NAD Neuro: subjective diplopia; dysesthesia all branches of trigeminal nerve. 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   1. Acute Pontine CVA: initial CT head reveals left pontine lacunar infarct of indeterminate chronicity; unable to get MRI due to pacemaker. Repeat CT head did not show any new or evolving acute infarct. TCDs consistent with diffuse intracranial atherosclerosis.  Medical optimization for atherosclerotic disease. TTE unremarkable and no significant stenosis on carotid dopplers  - continue lipitor to 80mg  daily - given that he was on daily aspirin on presentation, will treat with DAPT x3 weeks followed by Plavix alone - appreciate  neurology following - PT/OT recommending CIR; he has been approved for placement today   2. HTN: on amlodipine, losartan/HCTZ at home - resumed amlodipine yesterday; will resume losartan/HCTZ at discharge   3. HFpEF Complete heart block s/p pacemaker - euvolemic on exam - holding home Lasix due to AKI and it is a low dose used for his lymphedema  4. AKI on CKD III - creatinine improved to 2  - holding home Lasix as this is for lymphedema which has improved with unna boots - can resume as needed outpatient if creatinine is stable   5. Fabry disease - nephrologist recommended holding dose of Fabrazyme infusion in the setting of acute illness with plan to resume normal schedule in 2 weeks  Dispo: Anticipated discharge to CIR today.   Modena Nunnery D, DO 10/23/2018, 6:56 AM Pager: [phone]

## 2018-10-23 NOTE — H&P (Signed)
Physical Medicine and Rehabilitation Admission H&P    Chief Complaint  Patient presents with  . Functional deficits due to stroke     HPI: [name] is a 58 year old male with  History of 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bimonthly Fabrazyme infusions), COPD with asthma, T2DM, HTN, CHB s/p PPM; who was admitted on 10/19/2018 with difficulty walking, headache and left facial paresthesias.  Work-up revealed staph UTI and CT head done for work-up and was negative.  Neurology consulted for input and exam revealed ataxia with nystagmus and diplopia felt to be likely due to small brainstem infarct.  2D echo showed EF of 55 to 60% with moderate LVH and negative bubble study for PFO.  Carotid Dopplers showed no significant ICA stenosis.  Dr. Erlinda Hong recommends DAPT X 3 weeks followed by Plavix alone for stroke due to small vessel disease in setting of Fabry[s disease.  Urine cultures positive for staph and patient with frequency--treated with ceftriaxone. Fabrazyme treatment to be held in setting of acute illness and to resume schedule in 2 weeks per input from nephrology.  Therapy ongoing and patient continues to be limited by ataxia with diplopia and balance deficits affecting mobility and ADLs.  CIR recommended due to functional decline.    Review of Systems  Constitutional: Negative for chills and fever.  HENT: Negative for hearing loss.   Eyes: Positive for double vision. Negative for blurred vision.  Respiratory: Positive for shortness of breath and wheezing. Negative for cough.   Cardiovascular: Negative for chest pain and palpitations.  Gastrointestinal: Negative for heartburn and nausea.  Genitourinary: Negative for dysuria and urgency.  Musculoskeletal: Negative for back pain, myalgias and neck pain.  Skin: Negative for itching and rash.  Neurological: Positive for sensory change (left facial numbness). Negative for dizziness and headaches (better with glasses).  Psychiatric/Behavioral: Negative for  depression. The patient has insomnia. The patient is not nervous/anxious.      Past Medical History:  Diagnosis Date  . Bell's palsy   . Bifascicular block   . Chronic bronchitis ([place])    "seasonal; get it q yr"  . Chronic diastolic CHF (congestive heart failure) ([place]) 2010  . Chronic kidney disease (CKD), stage II (mild)    stage II to III/notes 07/23/2014  . Diastolic heart failure secondary to hypertrophic cardiomyopathy ([place]) CARDIOLOGIST-- DR Daneen Schick  . Dyspnea    increased exertion   . Edema 07/2013  . Fabry's disease ([place]) RENAL AND CARDIAC INVOVLEMENT   FOLLOWED DR [place]  . Gout, arthritis 2014   bil feet. right worse  . History of cellulitis of skin with lymphangitis LEFT LEG  . HOCM (hypertrophic obstructive cardiomyopathy) ([place])    a. Echo 11/16: Severe LVH, EF 55-60%, abnormal GLS consistent with HOCM, no SAM, mild LAE  . Hypertension 20 years  . Lymphedema of lower extremity LEFT  >  RIGHT   "using ankle high socks at home; pump doesn't work for me" (07/23/2014)  . Pacemaker    02/12/11  . Pneumonia 08/2011  . Seasonal asthma NO INHALERS   30 years  . Second degree Mobitz II AV block    with syncope, s/p PPM  . Short of breath on exertion   . Type II diabetes mellitus ([place]) 2012   INSULIN DEPENDENT    Past Surgical History:  Procedure Laterality Date  . CARDIAC CATHETERIZATION  03-12-2011  DR Daneen Schick   HYPERTROPHIC CARDIOMYOPATHY WITH LV CAVITY  APPEARANCE CONSISTANT WITH SIGNIFICANT APICAL HYPERTROPHY/ NORMAL LVSF /  EF 55%/ EVIDENCE OF DIASTOLIC DYSFUNCTION WITH EDP OF 23-40mmHg AFTER A-WAVE/ NORMAL CORONARY ARTERIES  . EVALUATION UNDER ANESTHESIA WITH FISTULECTOMY N/A 06/23/2015   Procedure: EXAM UNDER ANESTHESIA WITH POSSIBLE FISTULOTOMY;  Surgeon: Judeth Horn, MD;  Location: [facility];  Service: General;  Laterality: N/A;  . HERNIA REPAIR  [id number]   Umbilical Hernia Repair  . INCISION AND DRAINAGE PERIRECTAL ABSCESS Left 07/29/2014   Procedure: IRRIGATION  AND DEBRIDEMENT PERIRECTAL ABSCESS;  Surgeon: Doreen Salvage, MD;  Location: [facility];  Service: General;  Laterality: Left;  Prone position  . INSERT / REPLACE / REMOVE PACEMAKER  02/12/2011   SJM implanted by Dr Rayann Heman for Mobitz II second degree AV block and syncope  . IRRIGATION AND DEBRIDEMENT ABSCESS N/A 07/27/2013   Procedure: IRRIGATION AND DEBRIDEMENT OF SKIN, SOFT TISSUE AND MUSCLES OF UPPER BACK (11X19X4cm) WITH 10 BLADE AND PULSATILE LAVAGE ;  Surgeon: Gayland Curry, MD;  Location: [facility];  Service: General;  Laterality: N/A;  . UMBILICAL HERNIA REPAIR  03/22/2012   Procedure: HERNIA REPAIR UMBILICAL ADULT;  Surgeon: Leighton Ruff, MD;  Location: [facility];  Service: General;  Laterality: N/A;  Umbilical Hernia Repair     Family History  Problem Relation Age of Onset  . Stroke Mother   . Fabry's disease Mother   . Fabry's disease Brother   . Colon cancer Neg Hx     Social History:   Lives alone. 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 He reports that he has never smoked. He has never used smokeless tobacco. He reports current alcohol use. He reports that he does not use drugs.    Allergies  Allergen Reactions  . Lisinopril Anaphylaxis  . Shellfish Allergy Hives and Swelling    Medications Prior to Admission  Medication Sig Dispense Refill  . acetaminophen (TYLENOL) 500 MG tablet Take 500 mg by mouth every 14 (fourteen) days. Only taking 1 hour before infusion.  AS premed for Fabrazyme    . Agalsidase beta (FABRAZYME IV) Inject 115 mg into the vein every 14 (fourteen) days.     [name] Kitchen albuterol (PROAIR HFA) 108 (90 Base) MCG/ACT inhaler Inhale 1 puff into the lungs every 6 (six) hours as needed for wheezing or shortness of breath. 8.5 Inhaler 1  . allopurinol (ZYLOPRIM) 100 MG tablet Take 3 tablets (300 mg total) by mouth daily. 90 tablet 1  . amLODipine (NORVASC) 10 MG tablet Take 1 tablet (10 mg total) by mouth daily. 30 tablet 11  . aspirin 81 MG tablet Take 81 mg by mouth  daily.     [name] Kitchen atorvastatin (LIPITOR) 20 MG tablet Take 1 tablet (20 mg total) by mouth daily. (Patient taking differently: Take 20 mg by mouth as needed (for cholesterol). ) 30 tablet 5  . fexofenadine (ALLEGRA) 180 MG tablet Take 1 tablet (180 mg total) by mouth daily. 30 tablet 3  . fluticasone (FLONASE) 50 MCG/ACT nasal spray Place 1 spray into both nostrils daily. (Patient taking differently: Place 1 spray into both nostrils as needed for allergies. ) 16 g 1  . furosemide (LASIX) 20 MG tablet Take 2 tablets (40 mg total) by mouth daily. Lower extremiity edema 120 tablet 6  . hydrocortisone valerate cream (WESTCORT) 0.2 % Apply to affected area daily PRN (Patient taking differently: Apply 1 application topically daily as needed (on affected areas of skin). ) 45 g 0  . Insulin Lispro Prot & Lispro (HUMALOG 75/25 MIX) (75-25) 100 UNIT/ML Kwikpen INJECT 20 UNITS INTO  THE SKIN 2 TIMES DAILY (Patient taking differently: Inject 20 Units into the skin 2 (two) times a day. INJECT 20 UNITS INTO THE SKIN 2 TIMES DAILY) 15 mL 2  . losartan-hydrochlorothiazide (HYZAAR) 50-12.5 MG tablet Take 1 tablet by mouth daily. 30 tablet 5  . OLIVE LEAF PO Take 1 tablet by mouth 2 (two) times daily.     [name] Kitchen ACCU-CHEK AVIVA PLUS test strip TEST 3 TIMES DAILY. ICD 10 E11.8 100 each 12  . ACCU-CHEK FASTCLIX LANCETS MISC Use to test blood sugar 3 to 4 times daily. 102 each 5  . Insulin Pen Needle (PX SHORTLENGTH PEN NEEDLES) 31G X 8 MM MISC 1 each by Does not apply route 2 (two) times daily before a meal. 100 each 8    Drug Regimen Review  Drug regimen was reviewed and remains appropriate with no significant issues identified  Home: Home Living Family/patient expects to be discharged to:: Private residence Living Arrangements: Alone Available Help at Discharge: Friend(s), Available PRN/intermittently Type of Home: House Home Access: Stairs to enter [employer] of Steps: 2 Entrance Stairs-Rails: Right, Left,  Can reach both Home Layout: One level Bathroom Shower/Tub: Multimedia programmer: Standard Bathroom Accessibility: Yes Home Equipment: Environmental consultant - 2 wheels Additional Comments: rw is left over from diabetic event with balance deficits afterward/ 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  Lives With: Alone   Functional History: Prior Function Level of Independence: Independent Comments: driving, retired  Functional Status:  Mobility: Bed Mobility Overal bed mobility: Needs Assistance Bed Mobility: Supine to Sit, Sit to Supine Supine to sit: Supervision Sit to supine: Supervision General bed mobility comments: bed flat with no rails used. increased time/effort needed. Transfers Overall transfer level: Needs assistance Equipment used: Rolling walker (2 wheeled) Transfers: Sit to/from Stand Sit to Stand: Min assist, Total assist General transfer comment: needs assist to stabilize with standing both times. total assist with sitting to bed after completing 180* turn from gait due to left lateral balance loss. min assist with second sit down after balance activities.  Ambulation/Gait Ambulation/Gait assistance: Mod assist Gait Distance (Feet): 30 Feet Assistive device: Rolling walker (2 wheeled) Gait Pattern/deviations: Step-to pattern, Narrow base of support, Ataxic, Decreased stride length, Decreased weight shift to right, Decreased stance time - right General Gait Details: mod assist consistenly throughout gait today. cues to bring left foot out from center for wider base of support and improved balacne. cues/facilitation for weight shifting with gait. one loss of balance toward left after turning to sit on bed needing total assist to sit safety on bed surface.  Gait velocity: decreased    ADL: ADL Overall ADL's : Needs assistance/impaired Eating/Feeding: Modified independent, Sitting Grooming: Minimal assistance, Standing(mod (A0 for balance) Grooming Details (indicate cue type and  reason): minA for balance Upper Body Bathing: Minimal assistance, Sitting Lower Body Bathing: Minimal assistance, Moderate assistance, Sit to/from stand Upper Body Dressing : Minimal assistance, Sitting Lower Body Dressing: Moderate assistance Lower Body Dressing Details (indicate cue type and reason): pt don shoes with the back folded down due to the edema in bil LE. pt reports searching for 2 years for shoes to fit his feet Toilet Transfer: Minimal assistance, Ambulation, RW Toilet Transfer Details (indicate cue type and reason): simulated via transfer to/from EOB Toileting- Clothing Manipulation and Hygiene: Moderate assistance, Sit to/from stand Functional mobility during ADLs: Minimal assistance, Rolling walker General ADL Comments: pt with decreased balance, weakness, reports of double vision  Cognition: Cognition Overall Cognitive Status: Impaired/Different  from baseline Arousal/Alertness: Awake/alert Orientation Level: Oriented X4 Attention: Focused, Sustained Focused Attention: Appears intact(Vigilance WNL: 1/1) Sustained Attention: Impaired Sustained Attention Impairment: Verbal complex(Serial 7s: 1/3) Memory: Appears intact(Immediate: 5/5; Delayed: 5/5) Awareness: Appears intact Problem Solving: Appears intact(5/5) Executive Function: Reasoning, Sequencing, Organizing Reasoning: Appears intact(Abstraction: 2/2) Sequencing: Impaired Sequencing Impairment: Verbal complex(Clock drawing: 2/3) Organizing: Appears intact(Backward digit span: 1/1) Cognition Arousal/Alertness: Awake/alert Behavior During Therapy: WFL for tasks assessed/performed Overall Cognitive Status: Impaired/Different from baseline Area of Impairment: Problem solving, Safety/judgement Safety/Judgement: Decreased awareness of deficits Problem Solving: Difficulty sequencing, Requires verbal cues   Blood pressure (!) 143/88, pulse 60, temperature 98.4 F (36.9 C), temperature source Oral, resp. rate 16,  height 5\' 10"  (1.778 m), weight 132.9 kg, SpO2 97 %. Physical Exam  Nursing note and vitals reviewed. Constitutional: He is oriented to person, place, and time. He appears well-developed and well-nourished. No distress.  HENT:  Head: Normocephalic and atraumatic.  Eyes: EOM are normal. Right eye exhibits no discharge.  Neck: Normal range of motion. No tracheal deviation present. No thyromegaly present.  Cardiovascular: Normal rate. Exam reveals no friction rub.  No murmur heard. Respiratory: Effort normal and breath sounds normal. No respiratory distress. He has no wheezes.  GI: Soft. Bowel sounds are normal. He exhibits no distension. There is no abdominal tenderness.  Musculoskeletal:        General: Tenderness (left knee especially along lateral joint line) and edema present.     Comments: BLE with 3+ edema--unna boots in place, left leg more swollen than right.  Minimal compression however.  Neurological: He is alert and oriented to person, place, and time.  Speech clear. Normal language. Intact memory and reasonable insight and awareness. Reports left facial numbness with diplopia bilateral lateral fields. Right lens on eye glasses taped. UE 4/5 bilaterally. RLE 3/5. LLE 2/5 but limited by discomfort and swelling. Decreased sensation to LT in bilateral feet.   Skin: Skin is warm. He is not diaphoretic.  Skin on legs not visualized due to wraps. Visile skin appears dry.   Psychiatric: He has a normal mood and affect. His behavior is normal. Judgment and thought content normal.    Results for orders placed or performed during the hospital encounter of 10/19/18 (from the past 48 hour(s))  Glucose, capillary     Status: Abnormal   Collection Time: 10/21/18  4:57 PM  Result Value Ref Range   Glucose-Capillary 148 (H) 70 - 99 mg/dL   Comment 1 Notify RN    Comment 2 Document in Chart   Glucose, capillary     Status: Abnormal   Collection Time: 10/21/18  9:12 PM  Result Value Ref Range    Glucose-Capillary 145 (H) 70 - 99 mg/dL   Comment 1 Notify RN    Comment 2 Document in Chart   Basic metabolic panel     Status: Abnormal   Collection Time: 10/22/18  3:55 AM  Result Value Ref Range   Sodium 142 135 - 145 mmol/L   Potassium 3.6 3.5 - 5.1 mmol/L   Chloride 108 98 - 111 mmol/L   CO2 26 22 - 32 mmol/L   Glucose, Bld 97 70 - 99 mg/dL   BUN 29 (H) 6 - 20 mg/dL   Creatinine, Ser 2.05 (H) 0.61 - 1.24 mg/dL   Calcium 8.7 (L) 8.9 - 10.3 mg/dL   GFR calc non Af Amer 35 (L) >60 mL/min   GFR calc Af Amer 40 (L) >60 mL/min   Anion gap 8  5 - 15    Comment: Performed at [facility], [place] [address], [place], [place] [address]  Glucose, capillary     Status: Abnormal   Collection Time: 10/22/18  6:33 AM  Result Value Ref Range   Glucose-Capillary 124 (H) 70 - 99 mg/dL   Comment 1 Notify RN    Comment 2 Document in Chart   Glucose, capillary     Status: Abnormal   Collection Time: 10/22/18 11:04 AM  Result Value Ref Range   Glucose-Capillary 117 (H) 70 - 99 mg/dL   Comment 1 Notify RN    Comment 2 Document in Chart   Glucose, capillary     Status: Abnormal   Collection Time: 10/22/18  4:32 PM  Result Value Ref Range   Glucose-Capillary 177 (H) 70 - 99 mg/dL   Comment 1 Notify RN    Comment 2 Document in Chart   Glucose, capillary     Status: Abnormal   Collection Time: 10/22/18  9:30 PM  Result Value Ref Range   Glucose-Capillary 111 (H) 70 - 99 mg/dL   Comment 1 Notify RN    Comment 2 Document in Chart   Glucose, capillary     Status: Abnormal   Collection Time: 10/23/18  6:56 AM  Result Value Ref Range   Glucose-Capillary 102 (H) 70 - 99 mg/dL   Comment 1 Notify RN    Comment 2 Document in Chart   Glucose, capillary     Status: None   Collection Time: 10/23/18 12:12 PM  Result Value Ref Range   Glucose-Capillary 97 70 - 99 mg/dL   No results found.     Medical Problem List and Plan: 1.  Functional and mobility deficits secondary to presumptive  brainstem infarct  -admit to inpatient rehab 2.  Antithrombotics: -DVT/anticoagulation:  Pharmaceutical: Lovenox  -antiplatelet therapy: ASA/Plavix X 3 weeks followed by Plavix alone.  3. Pain Management: tylenol prn.  4. Mood: LCSW to follow for evaluation and support.   -antipsychotic agents: N/A 5. Neuropsych: This patient is capable of making decisions on his own behalf. 6. Skin/Wound Care: Routine pressure relief measures. Continue unna boots for edema control--change weekly. These need to be re-applied to apply more compression. Contact [facility] RN for follow up tomorrow.  7. Fluids/Electrolytes/Nutrition: Monitor I/O. Check lytes in am.  8. T2DM: Will monitor BS achs. Continue 70/30 insulin bid and titrate  as indicated.   9.Fabry's disease/ acute on CKD: Monitor with serial checks. Follow up labs---infusion ordered for 5/29 per Dr. Marval Regal.   10. Diastolic HF/DOE: Monitor weights daily. Albuterol prn SOB. Unna boots  11. CHB s/p PPM: Continue to monitor HR bid. Will monitor weights daily as lasix on hold. 12.  HTN: Monitor BP bid. On Norvasc daily. Lasix on hold at this time.  13. Dyslipidemia: On Lipitor 80 mg daily.         Bary Leriche, PA-C 10/23/2018

## 2018-10-23 NOTE — Progress Notes (Signed)
Patient arrived to unit and oriented to unit activities. No questions at this time. Resting comfortably with call bell in place.

## 2018-10-23 NOTE — Discharge Summary (Addendum)
Name: [name] MRN: [id number] DOB: Jan 14, 1961 58 y.o. PCP: Ladell Pier, MD  Date of Admission: 10/19/2018  5:56 AM Date of Discharge: 10/23/2018 Attending Physician: Axel Filler, *  Discharge Diagnosis: 1. [place] CVA  Discharge Medications: Allergies as of 10/23/2018      Reactions   Lisinopril Anaphylaxis   Shellfish Allergy Hives, Swelling      Medication List    STOP taking these medications   furosemide 20 MG tablet Commonly known as:  LASIX     TAKE these medications   Accu-Chek Aviva Plus test strip Generic drug:  glucose blood TEST 3 TIMES DAILY. ICD 10 E11.8   Accu-Chek FastClix Lancets Misc Use to test blood sugar 3 to 4 times daily.   acetaminophen 500 MG tablet Commonly known as:  TYLENOL Take 500 mg by mouth every 14 (fourteen) days. Only taking 1 hour before infusion.  AS premed for Fabrazyme   albuterol 108 (90 Base) MCG/ACT inhaler Commonly known as:  ProAir HFA Inhale 1 puff into the lungs every 6 (six) hours as needed for wheezing or shortness of breath.   allopurinol 100 MG tablet Commonly known as:  ZYLOPRIM Take 3 tablets (300 mg total) by mouth daily.   amLODipine 10 MG tablet Commonly known as:  NORVASC Take 1 tablet (10 mg total) by mouth daily.   aspirin 81 MG tablet Take 81 mg by mouth daily.   atorvastatin 80 MG tablet Commonly known as:  LIPITOR Take 1 tablet (80 mg total) by mouth daily at 6 PM. What changed:    medication strength  how much to take  when to take this   clopidogrel 75 MG tablet Commonly known as:  PLAVIX Take 1 tablet (75 mg total) by mouth daily for 30 days. Start taking on:  Oct 24, 2018   FABRAZYME IV Inject 115 mg into the vein every 14 (fourteen) days.   fexofenadine 180 MG tablet Commonly known as:  ALLEGRA Take 1 tablet (180 mg total) by mouth daily.   fluticasone 50 MCG/ACT nasal spray Commonly known as:  FLONASE Place 1 spray into both nostrils daily. What  changed:    when to take this  reasons to take this   hydrocortisone valerate cream 0.2 % Commonly known as:  WESTCORT Apply to affected area daily PRN What changed:    how much to take  how to take this  when to take this  reasons to take this  additional instructions   Insulin Lispro Prot & Lispro (75-25) 100 UNIT/ML Kwikpen Commonly known as:  HUMALOG 75/25 MIX INJECT 20 UNITS INTO THE SKIN 2 TIMES DAILY What changed:    how much to take  how to take this  when to take this   Insulin Pen Needle 31G X 8 MM Misc Commonly known as:  PX Shortlength Pen Needles 1 each by Does not apply route 2 (two) times daily before a meal.   losartan-hydrochlorothiazide 50-12.5 MG tablet Commonly known as:  HYZAAR Take 1 tablet by mouth daily.   OLIVE LEAF PO Take 1 tablet by mouth 2 (two) times daily.       Disposition and follow-up:   Mr.[name] was discharged from [facility] in Stable condition.  At the hospital follow up visit please address:  1.  Brainstem Lacunar Infarct: Presumed, unable to obtain MRI due to pacemaker. Likely small vessel disease, possibly related to his Fabry's disease. Discharged on lipitor 80 mg and  DAPT (ASA and plavix) x 3 weeks, then plavix alone.    2. AKI: On CKD III, lasix held on discharge. Prescribed 40 mg daily for lymphedema. Losartan-HCTZ resumed on discharge. Baseline creatinine ~ 1.18, improved to 2.0 on discharge.   3.  Labs / imaging needed at time of follow-up: BMP  4.  Pending labs/ test needing follow-up: None  Follow-up Appointments: Follow-up Information    Marcial Pacas, MD. Schedule an appointment as soon as possible for a visit in 4 week(s).   Specialty:  Neurology Contact information: [place] [address] [phone]           Hospital Course by problem list:  1. Acute [place] CVA: 58 yo M with pmhx of Fabry's disease, HTN, IDDM, COPD with asthma, HFpEF,  complete heart block s/p pacemaker, and CKD III who presented with ataxia, diplopia, and decreased sensation on the left side of his face. CT head demonstrated old atherosclerotic degenerative changes, but no acute infarct. Unable to obtain MRI due to pacemaker. Neurology was consulted who felt his symptoms were most consistent with a small vessel lacunar infarct, too small to be picked up on CT. He was admitted for stroke work up and medical optimization. Discharged on lipitor 80 mg and DAPT with aspirin and plavix. Plan to continue plavix alone after 3 weeks.     2. AKI: on CKD III. Serum creatinine peaked at 2.3, from baseline of ~1.8. Lasix held, losartan-HCTZ resumed at discharge. Please repeat BMP at follow up.   3. Lymphedema: Lasix held, unna wrappings applied.   Discharge Vitals:   BP 139/84 (BP Location: Right Arm)   Pulse 66   Temp 98.9 F (37.2 C) (Oral)   Resp 20   Ht 5\' 10"  (1.778 m)   Wt 132.9 kg   SpO2 97%   BMI 42.04 kg/m   Pertinent Labs, Studies, and Procedures:   10/19/2018 CT Head Wo Contrast: IMPRESSION: 1. Chronic intracranial artery dolichoectasia with progressed calcified atherosclerosis since 2012. A lacune or infarct of the left pons is new since 2012 but probably chronic. Still, consider Acute Small Vessel Ischemia in this clinical setting. 2. No intracranial hemorrhage or cortically based infarct identified.  Discharge Instructions: Discharge Instructions    Ambulatory referral to Neurology   Complete by:  As directed    An appointment is requested in approximately: 4-6 weeks   Diet - low sodium heart healthy   Complete by:  As directed    Discharge instructions   Complete by:  As directed    [name], It was a pleasure taking care of you. I wish you the best as you continue rehab.  We have adjusted your cholesterol medication which you will need to take daily. You will also take aspirin and Plavix for 3 weeks, followed by Plavix alone.  You  will follow-up with neurology after you are discharged from rehab.   We have stopped your Lasix for now, as I think the leg wrappings will help your swelling more than anything. Your primary doctor can re-start this if indicated after you have your kidney function re-checked.  Take care!   Increase activity slowly   Complete by:  As directed       Signed: Velna Ochs, MD 10/23/2018, 11:42 AM   Pager: [phone]

## 2018-10-23 NOTE — Progress Notes (Signed)
Orthopedic Tech Progress Note Patient Details:  [name] 14-Apr-1961 [id number]  Ortho Devices Type of Ortho Device: Louretta Parma boot Ortho Device/Splint Interventions: Application, Ordered   Post Interventions Patient Tolerated: Well Instructions Provided: Care of device, Adjustment of device   Melony Overly T 10/23/2018, 4:22 PM

## 2018-10-23 NOTE — Progress Notes (Signed)
[name] Staggers, MD  Physician  Physical Medicine and Rehabilitation  PMR Pre-admission  Signed  Date of Service:  10/23/2018 11:25 AM       Related encounter: ED to Hosp-Admission (Discharged) from 10/19/2018 in [facility]      Signed         Show:Clear all '[x]'$ Manual'[x]'$ Template'[]'$ Copied  Added by: '[x]'$ [name] Gong, RN'[x]'$ [name] Staggers, MD  '[]'$ Hover for details PMR Admission Coordinator Pre-Admission Assessment  [name] is an 58 y.o., male MRN: [id number] DOB: December 18, 1960 Height: '5\' 10"'$  (177.8 cm) Weight: 132.9 kg  Insurance Information HMO:     PPO: yes     PCP:      IPA:      80/20:      OTHER: medicare advantage plan PRIMARY: U[employer] Medicare      Policy#: [id number]      Subscriber: pt CM Name: [name]      Phone#: [phone] ext [phone]     Fax#: [phone] Pre-Cert#: [id number] approved for 3 days with f/u [name]      Employer: n/a Benefits:  Phone #: [phone]     Name: 5/18 Eff. Date: 06/07/2016     Deduct: none      Out of Pocket Max: $4500/met      Life Max: none CIR: $325 co pay per day days 1 until 5      SNF: no co pay days 1 until 20; $160 co pay per day days 21 until 49; no co pay days 50 until 100 Outpatient: $35 per visit     Co-Pay: visits per medical necessity Home Health: 100%      Co-Pay: visits per medical neccesity DME: 80%     Co-Pay: 20% Providers: in network  SECONDARY: none        Medicaid Application Date:       Case Manager:  Disability Application Date:       Case Worker:   The "Data Collection Information Summary" for patients in Inpatient Rehabilitation Facilities with attached "Privacy Act Bland Records" was provided and verbally reviewed with: Patient  Emergency Contact Information         Contact Information    Name Relation Home Work Mobile   [name] [phone]  [phone]      Current Medical History  Patient Admitting Diagnosis:  none  History of Present Illness:58 year old male with 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 Presented 5/14 with two days of unsteadiness on his feet.   Neuro consulted and felt likely brain stem lacunar infarct secondary to small vessel disease which was too small to be seen on CT scan. Gelt related to his Fabry's disease. CT old left pontine CVA and repeat CT scan with no acute CVA. TCD diffuse intracranial atherosclerosis, Carotid Doppler unremarkable, 2 d echo EF 55 - 60 % with no cardiac source for emboli., LDL 161, Hgb A1c 7.4. Lovenox for DVT prophylaxis. Placed on ASA 81 mg and plavix 75 mg daily for 3 weeks then plavix alone.   Followed by Dr. Krista Blue at [facility] on Fabrazyme infusion every 14 days.   Stable HTN and to allow permissive HTN and gradually normalize in 2 to 3 days.   Hyperlipidemia with Lipitor increased to 80 mg per day. To follow up with PCP for better DM control.   Complete NIHSS TOTAL:  3  Patient's medical record from [facility]  has been reviewed by the rehabilitation admission coordinator and physician.  Past Medical History      Past Medical History:  Diagnosis Date  . Bell's palsy   . Bifascicular block   . Chronic bronchitis ([place])    "seasonal; get it q yr"  . Chronic diastolic CHF (congestive heart failure) ([place]) 2010  . Chronic kidney disease (CKD), stage II (mild)    stage II to III/notes 07/23/2014  . Diastolic heart failure secondary to hypertrophic cardiomyopathy ([place]) CARDIOLOGIST-- DR Daneen Schick  . Dyspnea    increased exertion   . Edema 07/2013  . Fabry's disease ([place]) RENAL AND CARDIAC INVOVLEMENT   FOLLOWED DR [place]  . Gout, arthritis 2014   bil feet. right worse  . History of cellulitis of skin with lymphangitis LEFT LEG  . HOCM (hypertrophic obstructive cardiomyopathy) ([place])    a.  Echo 11/16: Severe LVH, EF 55-60%, abnormal GLS consistent with HOCM, no SAM, mild LAE  . Hypertension 20 years  . Lymphedema of lower extremity LEFT  >  RIGHT   "using ankle high socks at home; pump doesn't work for me" (07/23/2014)  . Pacemaker    02/12/11  . Pneumonia 08/2011  . Seasonal asthma NO INHALERS   30 years  . Second degree Mobitz II AV block    with syncope, s/p PPM  . Short of breath on exertion   . Type II diabetes mellitus ([place]) 2012   INSULIN DEPENDENT    Family History   family history includes Stroke in his mother.  Prior Rehab/Hospitalizations Has the patient had prior rehab or hospitalizations prior to admission? Yes  Has the patient had major surgery during 100 days prior to admission? No              Current Medications  Current Facility-Administered Medications:  .   stroke: mapping our early stages of recovery book, , Does not apply, Once, Greta Doom, MD .  0.9 %  sodium chloride infusion, , Intravenous, Q14 Days, Donato Heinz, MD .  acetaminophen (TYLENOL) tablet 650 mg, 650 mg, Oral, Q6H PRN **OR** acetaminophen (TYLENOL) suppository 650 mg, 650 mg, Rectal, Q6H PRN, Velna Ochs, MD .  allopurinol (ZYLOPRIM) tablet 300 mg, 300 mg, Oral, Daily, Velna Ochs, MD, 300 mg at 10/23/18 0915 .  amLODipine (NORVASC) tablet 10 mg, 10 mg, Oral, Daily, Katherine Roan, MD, 10 mg at 10/23/18 0915 .  aspirin EC tablet 81 mg, 81 mg, Oral, Daily, Velna Ochs, MD, 81 mg at 10/23/18 0915 .  atorvastatin (LIPITOR) tablet 80 mg, 80 mg, Oral, q1800, Katherine Roan, MD, 80 mg at 10/22/18 1719 .  clopidogrel (PLAVIX) tablet 75 mg, 75 mg, Oral, Daily, Biby, Sharon L, NP, 75 mg at 10/23/18 0915 .  enoxaparin (LOVENOX) injection 40 mg, 40 mg, Subcutaneous, Q24H, Velna Ochs, MD, 40 mg at 10/22/18 1024 .  insulin aspart protamine- aspart (NOVOLOG MIX 70/30) injection 16 Units, 16 Units, Subcutaneous, BID AC, Katherine Roan, MD, 16 Units at 10/23/18 0750 .  ondansetron (ZOFRAN) injection 4 mg, 4 mg, Intravenous, Q6H PRN **OR** ondansetron (ZOFRAN-ODT) disintegrating tablet 4 mg, 4 mg, Oral, Q6H PRN, Isabelle Course, MD, 4 mg at 10/19/18 1424  Patients Current Diet:     Diet Order                  Diet heart healthy/carb modified Room service appropriate? Yes with Assist; Fluid  consistency: Thin  Diet effective now               Precautions / Restrictions Precautions Precautions: Fall Precaution Comments: tends to have left lateral balance losses, improved with todays session vs with previous sessions Restrictions Weight Bearing Restrictions: No   Has the patient had 2 or more falls or a fall with injury in the past year? No  Prior Activity Level Limited Community (1-2x/wk): Independent without AD; drove; disabled due to lymphaedema  Prior Functional Level Self Care: Did the patient need help bathing, dressing, using the toilet or eating? Independent  Indoor Mobility: Did the patient need assistance with walking from room to room (with or without device)? Independent  Stairs: Did the patient need assistance with internal or external stairs (with or without device)? Independent  Functional Cognition: Did the patient need help planning regular tasks such as shopping or remembering to take medications? Independent  Home Assistive Devices / Equipment Home Assistive Devices/Equipment: None Home Equipment: Walker - 2 wheels  Prior Device Use: Indicate devices/aids used by the patient prior to current illness, exacerbation or injury? None of the above  Current Functional Level Cognition  Arousal/Alertness: Awake/alert Overall Cognitive Status: Impaired/Different from baseline Orientation Level: Oriented X4 Safety/Judgement: Decreased awareness of deficits Attention: Focused, Sustained Focused Attention: Appears intact(Vigilance WNL: 1/1) Sustained Attention:  Impaired Sustained Attention Impairment: Verbal complex(Serial 7s: 1/3) Memory: Appears intact(Immediate: 5/5; Delayed: 5/5) Awareness: Appears intact Problem Solving: Appears intact(5/5) Executive Function: Reasoning, Sequencing, Organizing Reasoning: Appears intact(Abstraction: 2/2) Sequencing: Impaired Sequencing Impairment: Verbal complex(Clock drawing: 2/3) Organizing: Appears intact(Backward digit span: 1/1)    Extremity Assessment (includes Sensation/Coordination)  Upper Extremity Assessment: Overall WFL for tasks assessed  Lower Extremity Assessment: Defer to PT evaluation    ADLs  Overall ADL's : Needs assistance/impaired Eating/Feeding: Modified independent, Sitting Grooming: Minimal assistance, Standing(mod (A0 for balance) Grooming Details (indicate cue type and reason): minA for balance Upper Body Bathing: Minimal assistance, Sitting Lower Body Bathing: Minimal assistance, Moderate assistance, Sit to/from stand Upper Body Dressing : Minimal assistance, Sitting Lower Body Dressing: Moderate assistance Lower Body Dressing Details (indicate cue type and reason): pt don shoes with the back folded down due to the edema in bil LE. pt reports searching for 2 years for shoes to fit his feet Toilet Transfer: Minimal assistance, Ambulation, RW Toilet Transfer Details (indicate cue type and reason): simulated via transfer to/from EOB Toileting- Clothing Manipulation and Hygiene: Moderate assistance, Sit to/from stand Functional mobility during ADLs: Minimal assistance, Rolling walker General ADL Comments: pt with decreased balance, weakness, reports of double vision    Mobility  Overal bed mobility: Needs Assistance Bed Mobility: Supine to Sit, Sit to Supine Supine to sit: Supervision Sit to supine: Supervision General bed mobility comments: bed flat with no rails used. increased time/effort needed.    Transfers  Overall transfer level: Needs assistance Equipment  used: Rolling walker (2 wheeled) Transfers: Sit to/from Stand Sit to Stand: Min assist, Total assist General transfer comment: needs assist to stabilize with standing both times. total assist with sitting to bed after completing 180* turn from gait due to left lateral balance loss. min assist with second sit down after balance activities.     Ambulation / Gait / Stairs / Wheelchair Mobility  Ambulation/Gait Ambulation/Gait assistance: Mod assist Gait Distance (Feet): 30 Feet Assistive device: Rolling walker (2 wheeled) Gait Pattern/deviations: Step-to pattern, Narrow base of support, Ataxic, Decreased stride length, Decreased weight shift to right, Decreased stance time - right General Gait  Details: mod assist consistenly throughout gait today. cues to bring left foot out from center for wider base of support and improved balacne. cues/facilitation for weight shifting with gait. one loss of balance toward left after turning to sit on bed needing total assist to sit safety on bed surface.  Gait velocity: decreased    Posture / Balance Balance Overall balance assessment: Needs assistance, Modified Independent Sitting-balance support: No upper extremity supported, Feet supported Sitting balance-Leahy Scale: Good Standing balance support: During functional activity, Bilateral upper extremity supported Standing balance-Leahy Scale: Poor Standing balance comment: standing next to bed with UE support on RW: with wide stance (hip width apart) worked on lateral weight shifting with emphasis on return to midline, progressed to static standing with equal weight bearing for EC 30 sec's x3. then progressed to upper trunk rotation with reaching behind him (same side) x 5 reps each way with cues for slow, controlled movements to assist with maintaining balance.     Special needs/care consideration BiPAP/CPAP  N/a CPM  N/a Continuous Drip IV  N/a Dialysis n/a Life Vest  N/a Oxygen  N/a Special Bed   N/a Trach Size  N/a Wound Vac n/a Skin  BLE wraps for lymphedema management Bowel mgmt:  Continent LBM 5/18 Bladder mgmt:  continent Diabetic mgmt:  Hgb A1c 7.4 Behavioral consideration  N/a Chemo/radiation  N/a   Previous Home Environment  Living Arrangements: Alone  Lives With: Alone Available Help at Discharge: Friend(s), Available PRN/intermittently Type of Home: House Home Layout: One level Home Access: Stairs to enter Entrance Stairs-Rails: Right, Left, Can reach both Entrance Stairs-Number of Steps: 2 Bathroom Shower/Tub: Multimedia programmer: Standard Bathroom Accessibility: Yes How Accessible: Accessible via walker Home Care Services: No Additional Comments: rw is left over from diabetic event with balance deficits afterward/ 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  Discharge Living Setting Plans for Discharge Living Setting: Alone Type of Home at Discharge: House Discharge Home Layout: One level Discharge Home Access: Stairs to enter Entrance Stairs-Rails: Right, Left, Can reach both Entrance Stairs-Number of Steps: 2 Discharge Bathroom Shower/Tub: Walk-in shower Discharge Bathroom Toilet: Standard Discharge Bathroom Accessibility: Yes How Accessible: Accessible via walker Does the patient have any problems obtaining your medications?: No  Social/Family/Support Systems Contact Information: family friend Music therapist; sister is in [place] Anticipated Caregiver: friend prn Anticipated Caregiver's Contact Information: see above Caregiver Availability: Intermittent Discharge Plan Discussed with Primary Caregiver: Yes Is Caregiver In Agreement with Plan?: Yes Does Caregiver/Family have Issues with Lodging/Transportation while Pt is in Rehab?: No   Patient's refrigerator was broken for 2 days pta. Pt had placed insulin on ice which is why he felt he was sick initially. New frig to be delivered the 23 rd  Goals/Additional Needs Patient/Family Goal for  Rehab: Mod I with PT and OT Expected length of stay: ELOS 10 to 12 days Pt/Family Agrees to Admission and willing to participate: Yes Program Orientation Provided & Reviewed with Pt/Caregiver Including Roles  & Responsibilities: Yes  Decrease burden of Care through IP rehab admission: n/a  Possible need for SNF placement upon discharge:  N/a  Patient Condition: I have reviewed medical records from [facility] , spoken with CSW, and patient. I met with patient at the bedside for inpatient rehabilitation assessment.  Patient will benefit from ongoing PT and OT, can actively participate in 3 hours of therapy a day 5 days of the week, and can make measurable gains during the admission.  Patient will also benefit  from the coordinated team approach during an Inpatient Acute Rehabilitation admission.  The patient will receive intensive therapy as well as Rehabilitation physician, nursing, social worker, and care management interventions.  Due to bladder management, bowel management, safety, skin/wound care, disease management, medication administration, pain management and patient education the patient requires 24 hour a day rehabilitation nursing.  The patient is currently  Mod assist with mobility and basic ADLs.  Discharge setting and therapy post discharge at home with home health is anticipated.  Patient has agreed to participate in the Acute Inpatient Rehabilitation Program and will admit today.  Preadmission Screen Completed By:  Cleatrice Burke RN MSN 10/23/2018 11:25 AM ______________________________________________________________________   Discussed status with Dr. Naaman Plummer  on  10/23/2018 at  1139 and received approval for admission today.  Admission Coordinator:  Cleatrice Burke, RN MSN, time  [id number] Date  10/23/2018   Assessment/Plan: Diagnosis: brainstem CVA 1. Does the need for close, 24 hr/day Medical supervision in concert with the patient's rehab needs make it  unreasonable for this patient to be served in a less intensive setting? Yes 2. Co-Morbidities requiring supervision/potential complications: Fabry disease, HTN, DM, COPD, OSA, CKD 3. Due to bladder management, bowel management, safety, skin/wound care, disease management, medication administration, pain management and patient education, does the patient require 24 hr/day rehab nursing? Yes 4. Does the patient require coordinated care of a physician, rehab nurse, PT (1-2 hrs/day, 5 days/week) and OT (1-2 hrs/day, 5 days/week) to address physical and functional deficits in the context of the above medical diagnosis(es)? Yes Addressing deficits in the following areas: balance, endurance, locomotion, strength, transferring, bowel/bladder control, bathing, dressing, feeding, grooming, toileting and psychosocial support 5. Can the patient actively participate in an intensive therapy program of at least 3 hrs of therapy 5 days a week? Yes 6. The potential for patient to make measurable gains while on inpatient rehab is excellent 7. Anticipated functional outcomes upon discharge from inpatients are: modified independent PT, modified independent OT, n/a SLP 8. Estimated rehab length of stay to reach the above functional goals is: 10-12 days 9. Anticipated D/C setting: Home 10. Anticipated post D/C treatments: [place] therapy 11. Overall Rehab/Functional Prognosis: excellent  MD Signature: [name] Staggers, MD, [facility] 10/23/2018         Revision History

## 2018-10-23 NOTE — H&P (Signed)
Physical Medicine and Rehabilitation Admission H&P        Chief Complaint  Patient presents with  . Functional deficits due to stroke       HPI: [name] is a 58 year old male with  History of 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bimonthly Fabrazyme infusions), COPD with asthma, T2DM, HTN, CHB s/p PPM; who was admitted on 10/19/2018 with difficulty walking, headache and left facial paresthesias.  Work-up revealed staph UTI and CT head done for work-up and was negative.  Neurology consulted for input and exam revealed ataxia with nystagmus and diplopia felt to be likely due to small brainstem infarct.  2D echo showed EF of 55 to 60% with moderate LVH and negative bubble study for PFO.  Carotid Dopplers showed no significant ICA stenosis.  Dr. Erlinda Hong recommends DAPT X 3 weeks followed by Plavix alone for stroke due to small vessel disease in setting of Fabry[s disease.  Urine cultures positive for staph and patient with frequency--treated with ceftriaxone. Fabrazyme treatment to be held in setting of acute illness and to resume schedule in 2 weeks per input from nephrology.  Therapy ongoing and patient continues to be limited by ataxia with diplopia and balance deficits affecting mobility and ADLs.  CIR recommended due to functional decline.      Review of Systems  Constitutional: Negative for chills and fever.  HENT: Negative for hearing loss.   Eyes: Positive for double vision. Negative for blurred vision.  Respiratory: Positive for shortness of breath and wheezing. Negative for cough.   Cardiovascular: Negative for chest pain and palpitations.  Gastrointestinal: Negative for heartburn and nausea.  Genitourinary: Negative for dysuria and urgency.  Musculoskeletal: Negative for back pain, myalgias and neck pain.  Skin: Negative for itching and rash.  Neurological: Positive for sensory change (left facial numbness). Negative for dizziness and headaches (better with glasses).  Psychiatric/Behavioral:  Negative for depression. The patient has insomnia. The patient is not nervous/anxious.           Past Medical History:  Diagnosis Date  . Bell's palsy    . Bifascicular block    . Chronic bronchitis ([place])      "seasonal; get it q yr"  . Chronic diastolic CHF (congestive heart failure) ([place]) 2010  . Chronic kidney disease (CKD), stage II (mild)      stage II to III/notes 07/23/2014  . Diastolic heart failure secondary to hypertrophic cardiomyopathy ([place]) CARDIOLOGIST-- DR Daneen Schick  . Dyspnea      increased exertion   . Edema 07/2013  . Fabry's disease ([place]) RENAL AND CARDIAC INVOVLEMENT    FOLLOWED DR [place]  . Gout, arthritis 2014    bil feet. right worse  . History of cellulitis of skin with lymphangitis LEFT LEG  . HOCM (hypertrophic obstructive cardiomyopathy) ([place])      a. Echo 11/16: Severe LVH, EF 55-60%, abnormal GLS consistent with HOCM, no SAM, mild LAE  . Hypertension 20 years  . Lymphedema of lower extremity LEFT  >  RIGHT    "using ankle high socks at home; pump doesn't work for me" (07/23/2014)  . Pacemaker      02/12/11  . Pneumonia 08/2011  . Seasonal asthma NO INHALERS    30 years  . Second degree Mobitz II AV block      with syncope, s/p PPM  . Short of breath on exertion    . Type II diabetes mellitus ([place]) 2012    INSULIN DEPENDENT  Past Surgical History:  Procedure Laterality Date  . CARDIAC CATHETERIZATION   03-12-2011  DR Daneen Schick    HYPERTROPHIC CARDIOMYOPATHY WITH LV CAVITY  APPEARANCE CONSISTANT WITH SIGNIFICANT APICAL HYPERTROPHY/ NORMAL LVSF / EF 55%/ EVIDENCE OF DIASTOLIC DYSFUNCTION WITH EDP OF 23-85mmHg AFTER A-WAVE/ NORMAL CORONARY ARTERIES  . EVALUATION UNDER ANESTHESIA WITH FISTULECTOMY N/A 06/23/2015    Procedure: EXAM UNDER ANESTHESIA WITH POSSIBLE FISTULOTOMY;  Surgeon: Judeth Horn, MD;  Location: [facility];  Service: General;  Laterality: N/A;  . HERNIA REPAIR   [id number]    Umbilical Hernia Repair  . INCISION AND DRAINAGE  PERIRECTAL ABSCESS Left 07/29/2014    Procedure: IRRIGATION AND DEBRIDEMENT PERIRECTAL ABSCESS;  Surgeon: Doreen Salvage, MD;  Location: [facility];  Service: General;  Laterality: Left;  Prone position  . INSERT / REPLACE / REMOVE PACEMAKER   02/12/2011    SJM implanted by Dr Rayann Heman for Mobitz II second degree AV block and syncope  . IRRIGATION AND DEBRIDEMENT ABSCESS N/A 07/27/2013    Procedure: IRRIGATION AND DEBRIDEMENT OF SKIN, SOFT TISSUE AND MUSCLES OF UPPER BACK (11X19X4cm) WITH 10 BLADE AND PULSATILE LAVAGE ;  Surgeon: Gayland Curry, MD;  Location: [facility];  Service: General;  Laterality: N/A;  . UMBILICAL HERNIA REPAIR   03/22/2012    Procedure: HERNIA REPAIR UMBILICAL ADULT;  Surgeon: Leighton Ruff, MD;  Location: [facility];  Service: General;  Laterality: N/A;  Umbilical Hernia Repair            Family History  Problem Relation Age of Onset  . Stroke Mother    . Fabry's disease Mother    . Fabry's disease Brother    . Colon cancer Neg Hx        Social History:   Lives alone. 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 He reports that he has never smoked. He has never used smokeless tobacco. He reports current alcohol use. He reports that he does not use drugs.           Allergies  Allergen Reactions  . Lisinopril Anaphylaxis  . Shellfish Allergy Hives and Swelling            Medications Prior to Admission  Medication Sig Dispense Refill  . acetaminophen (TYLENOL) 500 MG tablet Take 500 mg by mouth every 14 (fourteen) days. Only taking 1 hour before infusion.  AS premed for Fabrazyme      . Agalsidase beta (FABRAZYME IV) Inject 115 mg into the vein every 14 (fourteen) days.       [name] Kitchen albuterol (PROAIR HFA) 108 (90 Base) MCG/ACT inhaler Inhale 1 puff into the lungs every 6 (six) hours as needed for wheezing or shortness of breath. 8.5 Inhaler 1  . allopurinol (ZYLOPRIM) 100 MG tablet Take 3 tablets (300 mg total) by mouth daily. 90 tablet 1  . amLODipine (NORVASC) 10 MG tablet Take  1 tablet (10 mg total) by mouth daily. 30 tablet 11  . aspirin 81 MG tablet Take 81 mg by mouth daily.       [name] Kitchen atorvastatin (LIPITOR) 20 MG tablet Take 1 tablet (20 mg total) by mouth daily. (Patient taking differently: Take 20 mg by mouth as needed (for cholesterol). ) 30 tablet 5  . fexofenadine (ALLEGRA) 180 MG tablet Take 1 tablet (180 mg total) by mouth daily. 30 tablet 3  . fluticasone (FLONASE) 50 MCG/ACT nasal spray Place 1 spray into both nostrils daily. (Patient taking differently: Place 1 spray into both nostrils as needed  for allergies. ) 16 g 1  . furosemide (LASIX) 20 MG tablet Take 2 tablets (40 mg total) by mouth daily. Lower extremiity edema 120 tablet 6  . hydrocortisone valerate cream (WESTCORT) 0.2 % Apply to affected area daily PRN (Patient taking differently: Apply 1 application topically daily as needed (on affected areas of skin). ) 45 g 0  . Insulin Lispro Prot & Lispro (HUMALOG 75/25 MIX) (75-25) 100 UNIT/ML Kwikpen INJECT 20 UNITS INTO THE SKIN 2 TIMES DAILY (Patient taking differently: Inject 20 Units into the skin 2 (two) times a day. INJECT 20 UNITS INTO THE SKIN 2 TIMES DAILY) 15 mL 2  . losartan-hydrochlorothiazide (HYZAAR) 50-12.5 MG tablet Take 1 tablet by mouth daily. 30 tablet 5  . OLIVE LEAF PO Take 1 tablet by mouth 2 (two) times daily.       [name] Kitchen ACCU-CHEK AVIVA PLUS test strip TEST 3 TIMES DAILY. ICD 10 E11.8 100 each 12  . ACCU-CHEK FASTCLIX LANCETS MISC Use to test blood sugar 3 to 4 times daily. 102 each 5  . Insulin Pen Needle (PX SHORTLENGTH PEN NEEDLES) 31G X 8 MM MISC 1 each by Does not apply route 2 (two) times daily before a meal. 100 each 8      Drug Regimen Review  Drug regimen was reviewed and remains appropriate with no significant issues identified   Home: Home Living Family/patient expects to be discharged to:: Private residence Living Arrangements: Alone Available Help at Discharge: Friend(s), Available PRN/intermittently Type of Home:  House Home Access: Stairs to enter [employer] of Steps: 2 Entrance Stairs-Rails: Right, Left, Can reach both Home Layout: One level Bathroom Shower/Tub: Multimedia programmer: Standard Bathroom Accessibility: Yes Home Equipment: Environmental consultant - 2 wheels Additional Comments: rw is left over from diabetic event with balance deficits afterward/ 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  Lives With: Alone   Functional History: Prior Function Level of Independence: Independent Comments: driving, retired   Functional Status:  Mobility: Bed Mobility Overal bed mobility: Needs Assistance Bed Mobility: Supine to Sit, Sit to Supine Supine to sit: Supervision Sit to supine: Supervision General bed mobility comments: bed flat with no rails used. increased time/effort needed. Transfers Overall transfer level: Needs assistance Equipment used: Rolling walker (2 wheeled) Transfers: Sit to/from Stand Sit to Stand: Min assist, Total assist General transfer comment: needs assist to stabilize with standing both times. total assist with sitting to bed after completing 180* turn from gait due to left lateral balance loss. min assist with second sit down after balance activities.  Ambulation/Gait Ambulation/Gait assistance: Mod assist Gait Distance (Feet): 30 Feet Assistive device: Rolling walker (2 wheeled) Gait Pattern/deviations: Step-to pattern, Narrow base of support, Ataxic, Decreased stride length, Decreased weight shift to right, Decreased stance time - right General Gait Details: mod assist consistenly throughout gait today. cues to bring left foot out from center for wider base of support and improved balacne. cues/facilitation for weight shifting with gait. one loss of balance toward left after turning to sit on bed needing total assist to sit safety on bed surface.  Gait velocity: decreased   ADL: ADL Overall ADL's : Needs assistance/impaired Eating/Feeding: Modified independent,  Sitting Grooming: Minimal assistance, Standing(mod (A0 for balance) Grooming Details (indicate cue type and reason): minA for balance Upper Body Bathing: Minimal assistance, Sitting Lower Body Bathing: Minimal assistance, Moderate assistance, Sit to/from stand Upper Body Dressing : Minimal assistance, Sitting Lower Body Dressing: Moderate assistance Lower Body Dressing Details (indicate cue type and  reason): pt don shoes with the back folded down due to the edema in bil LE. pt reports searching for 2 years for shoes to fit his feet Toilet Transfer: Minimal assistance, Ambulation, RW Toilet Transfer Details (indicate cue type and reason): simulated via transfer to/from EOB Toileting- Clothing Manipulation and Hygiene: Moderate assistance, Sit to/from stand Functional mobility during ADLs: Minimal assistance, Rolling walker General ADL Comments: pt with decreased balance, weakness, reports of double vision   Cognition: Cognition Overall Cognitive Status: Impaired/Different from baseline Arousal/Alertness: Awake/alert Orientation Level: Oriented X4 Attention: Focused, Sustained Focused Attention: Appears intact(Vigilance WNL: 1/1) Sustained Attention: Impaired Sustained Attention Impairment: Verbal complex(Serial 7s: 1/3) Memory: Appears intact(Immediate: 5/5; Delayed: 5/5) Awareness: Appears intact Problem Solving: Appears intact(5/5) Executive Function: Reasoning, Sequencing, Organizing Reasoning: Appears intact(Abstraction: 2/2) Sequencing: Impaired Sequencing Impairment: Verbal complex(Clock drawing: 2/3) Organizing: Appears intact(Backward digit span: 1/1) Cognition Arousal/Alertness: Awake/alert Behavior During Therapy: WFL for tasks assessed/performed Overall Cognitive Status: Impaired/Different from baseline Area of Impairment: Problem solving, Safety/judgement Safety/Judgement: Decreased awareness of deficits Problem Solving: Difficulty sequencing, Requires verbal cues      Blood pressure (!) 143/88, pulse 60, temperature 98.4 F (36.9 C), temperature source Oral, resp. rate 16, height 5\' 10"  (1.778 m), weight 132.9 kg, SpO2 97 %. Physical Exam  Nursing note and vitals reviewed. Constitutional: He is oriented to person, place, and time. He appears well-developed and well-nourished. No distress.  HENT:  Head: Normocephalic and atraumatic.  Eyes: EOM are normal. Right eye exhibits no discharge.  Neck: Normal range of motion. No tracheal deviation present. No thyromegaly present.  Cardiovascular: Normal rate. Exam reveals no friction rub.  No murmur heard. Respiratory: Effort normal and breath sounds normal. No respiratory distress. He has no wheezes.  GI: Soft. Bowel sounds are normal. He exhibits no distension. There is no abdominal tenderness.  Musculoskeletal:        General: Tenderness (left knee especially along lateral joint line) and edema present.     Comments: BLE with 3+ edema--unna boots in place, left leg more swollen than right.  Minimal compression however.  Neurological: He is alert and oriented to person, place, and time.  Speech clear. Normal language. Intact memory and reasonable insight and awareness. Reports left facial numbness with diplopia bilateral lateral fields. Right lens on eye glasses taped. UE 4/5 bilaterally. RLE 3/5. LLE 2/5 but limited by discomfort and swelling. Decreased sensation to LT in bilateral feet.   Skin: Skin is warm. He is not diaphoretic.  Skin on legs not visualized due to wraps. Visile skin appears dry.   Psychiatric: He has a normal mood and affect. His behavior is normal. Judgment and thought content normal.      Lab Results Last 48 Hours        Results for orders placed or performed during the hospital encounter of 10/19/18 (from the past 48 hour(s))  Glucose, capillary     Status: Abnormal    Collection Time: 10/21/18  4:57 PM  Result Value Ref Range    Glucose-Capillary 148 (H) 70 - 99 mg/dL    Comment 1  Notify RN      Comment 2 Document in Chart    Glucose, capillary     Status: Abnormal    Collection Time: 10/21/18  9:12 PM  Result Value Ref Range    Glucose-Capillary 145 (H) 70 - 99 mg/dL    Comment 1 Notify RN      Comment 2 Document in Chart    Basic metabolic panel  Status: Abnormal    Collection Time: 10/22/18  3:55 AM  Result Value Ref Range    Sodium 142 135 - 145 mmol/L    Potassium 3.6 3.5 - 5.1 mmol/L    Chloride 108 98 - 111 mmol/L    CO2 26 22 - 32 mmol/L    Glucose, Bld 97 70 - 99 mg/dL    BUN 29 (H) 6 - 20 mg/dL    Creatinine, Ser 2.05 (H) 0.61 - 1.24 mg/dL    Calcium 8.7 (L) 8.9 - 10.3 mg/dL    GFR calc non Af Amer 35 (L) >60 mL/min    GFR calc Af Amer 40 (L) >60 mL/min    Anion gap 8 5 - 15      Comment: Performed at [facility] [address], [place], [place] [address]  Glucose, capillary     Status: Abnormal    Collection Time: 10/22/18  6:33 AM  Result Value Ref Range    Glucose-Capillary 124 (H) 70 - 99 mg/dL    Comment 1 Notify RN      Comment 2 Document in Chart    Glucose, capillary     Status: Abnormal    Collection Time: 10/22/18 11:04 AM  Result Value Ref Range    Glucose-Capillary 117 (H) 70 - 99 mg/dL    Comment 1 Notify RN      Comment 2 Document in Chart    Glucose, capillary     Status: Abnormal    Collection Time: 10/22/18  4:32 PM  Result Value Ref Range    Glucose-Capillary 177 (H) 70 - 99 mg/dL    Comment 1 Notify RN      Comment 2 Document in Chart    Glucose, capillary     Status: Abnormal    Collection Time: 10/22/18  9:30 PM  Result Value Ref Range    Glucose-Capillary 111 (H) 70 - 99 mg/dL    Comment 1 Notify RN      Comment 2 Document in Chart    Glucose, capillary     Status: Abnormal    Collection Time: 10/23/18  6:56 AM  Result Value Ref Range    Glucose-Capillary 102 (H) 70 - 99 mg/dL    Comment 1 Notify RN      Comment 2 Document in Chart    Glucose, capillary     Status: None    Collection Time:  10/23/18 12:12 PM  Result Value Ref Range    Glucose-Capillary 97 70 - 99 mg/dL      Imaging Results (Last 48 hours)  No results found.           Medical Problem List and Plan: 1.  Functional and mobility deficits secondary to presumptive brainstem infarct             -admit to inpatient rehab 2.  Antithrombotics: -DVT/anticoagulation:  Pharmaceutical: Lovenox             -antiplatelet therapy: ASA/Plavix X 3 weeks followed by Plavix alone.  3. Pain Management: tylenol prn.  4. Mood: LCSW to follow for evaluation and support.              -antipsychotic agents: N/A 5. Neuropsych: This patient is capable of making decisions on his own behalf. 6. Skin/Wound Care: Routine pressure relief measures. Continue unna boots for edema control--change weekly. These need to be re-applied to provide more compression. Contact WOC RN for follow up tomorrow.  7. Fluids/Electrolytes/Nutrition: Monitor I/O.  Check lytes in am.  8. T2DM: Will monitor BS achs. Continue 70/30 insulin bid and titrate  as indicated.   9.Fabry's disease/ acute on CKD: Monitor with serial checks. Follow up labs---infusion ordered for 5/29 per Dr. Marval Regal.   10. Diastolic HF/DOE: Monitor weights daily. Albuterol prn SOB. Unna boots  11. CHB s/p PPM: Continue to monitor HR bid. Will monitor weights daily as lasix on hold. 12.  HTN: Monitor BP bid. On Norvasc daily. Lasix on hold at this time.  13. Dyslipidemia: On Lipitor 80 mg daily.         Post Admission Physician Evaluation: 1. Functional deficits secondary  to brainstem infarct. 2. Patient is admitted to receive collaborative, interdisciplinary care between the physiatrist, rehab nursing staff, and therapy team. 3. Patient's level of medical complexity and substantial therapy needs in context of that medical necessity cannot be provided at a lesser intensity of care such as a SNF. 4. Patient has experienced substantial functional loss from his/her baseline which was  documented above under the "Functional History" and "Functional Status" headings.  Judging by the patient's diagnosis, physical exam, and functional history, the patient has potential for functional progress which will result in measurable gains while on inpatient rehab.  These gains will be of substantial and practical use upon discharge  in facilitating mobility and self-care at the household level. 5. Physiatrist will provide 24 hour management of medical needs as well as oversight of the therapy plan/treatment and provide guidance as appropriate regarding the interaction of the two. 6. The Preadmission Screening has been reviewed and patient status is unchanged unless otherwise stated above. 7. 24 hour rehab nursing will assist with bladder management, bowel management, safety, skin/wound care, disease management, medication administration, pain management and patient education  and help integrate therapy concepts, techniques,education, etc. 8. PT will assess and treat for/with: Lower extremity strength, range of motion, stamina, balance, functional mobility, safety, adaptive techniques and equipment, NMR.   Goals are: mod I. 9. OT will assess and treat for/with: ADL's, functional mobility, safety, upper extremity strength, adaptive techniques and equipment, NMR.   Goals are: mod I. Therapy may proceed with showering this patient. 10. Case Management and Social Worker will assess and treat for psychological issues and discharge planning. 11. Team conference will be held weekly to assess progress toward goals and to determine barriers to discharge. 12. Patient will receive at least 3 hours of therapy per day at least 5 days per week. 13. ELOS: 10-12 days       14. Prognosis:  excellent   I have personally performed a face to face diagnostic evaluation of this patient and formulated the key components of the plan.  Additionally, I have personally reviewed laboratory data, imaging studies, as well as  relevant notes and concur with the physician assistant's documentation above.  [name], MD, [place], PA-C 10/23/2018

## 2018-10-24 ENCOUNTER — Inpatient Hospital Stay (HOSPITAL_COMMUNITY): Payer: Medicare Other | Admitting: Physical Therapy

## 2018-10-24 ENCOUNTER — Inpatient Hospital Stay (HOSPITAL_COMMUNITY): Payer: Medicare Other

## 2018-10-24 ENCOUNTER — Inpatient Hospital Stay (HOSPITAL_COMMUNITY): Payer: Medicare Other | Admitting: Occupational Therapy

## 2018-10-24 DIAGNOSIS — H532 Diplopia: Secondary | ICD-10-CM

## 2018-10-24 DIAGNOSIS — I69393 Ataxia following cerebral infarction: Principal | ICD-10-CM

## 2018-10-24 DIAGNOSIS — M25462 Effusion, left knee: Secondary | ICD-10-CM

## 2018-10-24 LAB — COMPREHENSIVE METABOLIC PANEL
ALT: 19 U/L (ref 0–44)
AST: 25 U/L (ref 15–41)
Albumin: 2.7 g/dL — ABNORMAL LOW (ref 3.5–5.0)
Alkaline Phosphatase: 55 U/L (ref 38–126)
Anion gap: 13 (ref 5–15)
BUN: 29 mg/dL — ABNORMAL HIGH (ref 6–20)
CO2: 22 mmol/L (ref 22–32)
Calcium: 9 mg/dL (ref 8.9–10.3)
Chloride: 107 mmol/L (ref 98–111)
Creatinine, Ser: 1.87 mg/dL — ABNORMAL HIGH (ref 0.61–1.24)
GFR calc Af Amer: 45 mL/min — ABNORMAL LOW (ref >60)
GFR calc non Af Amer: 39 mL/min — ABNORMAL LOW (ref >60)
Glucose, Bld: 99 mg/dL (ref 70–99)
Potassium: 4.2 mmol/L (ref 3.5–5.1)
Sodium: 142 mmol/L (ref 135–145)
Total Bilirubin: 0.9 mg/dL (ref 0.3–1.2)
Total Protein: 7 g/dL (ref 6.5–8.1)

## 2018-10-24 LAB — CBC WITH DIFFERENTIAL/PLATELET
Abs Immature Granulocytes: 0.03 10*3/uL (ref 0.00–0.07)
Basophils Absolute: 0 10*3/uL (ref 0.0–0.1)
Basophils Relative: 0 %
Eosinophils Absolute: 0.3 10*3/uL (ref 0.0–0.5)
Eosinophils Relative: 3 %
HCT: 43.1 % (ref 39.0–52.0)
Hemoglobin: 14.3 g/dL (ref 13.0–17.0)
Immature Granulocytes: 0 %
Lymphocytes Relative: 23 %
Lymphs Abs: 2.3 10*3/uL (ref 0.7–4.0)
MCH: 29.7 pg (ref 26.0–34.0)
MCHC: 33.2 g/dL (ref 30.0–36.0)
MCV: 89.6 fL (ref 80.0–100.0)
Monocytes Absolute: 1.3 10*3/uL — ABNORMAL HIGH (ref 0.1–1.0)
Monocytes Relative: 13 %
Neutro Abs: 6.1 10*3/uL (ref 1.7–7.7)
Neutrophils Relative %: 61 %
Platelets: 175 10*3/uL (ref 150–400)
RBC: 4.81 MIL/uL (ref 4.22–5.81)
RDW: 13.3 % (ref 11.5–15.5)
WBC: 10 10*3/uL (ref 4.0–10.5)
nRBC: 0 % (ref 0.0–0.2)

## 2018-10-24 LAB — GLUCOSE, CAPILLARY
Glucose-Capillary: 104 mg/dL — ABNORMAL HIGH (ref 70–99)
Glucose-Capillary: 170 mg/dL — ABNORMAL HIGH (ref 70–99)
Glucose-Capillary: 142 mg/dL — ABNORMAL HIGH (ref 70–99)
Glucose-Capillary: 124 mg/dL — ABNORMAL HIGH (ref 70–99)

## 2018-10-24 MED ORDER — BLOOD PRESSURE CONTROL BOOK
Freq: Once | Status: AC
  Administered 2018-10-24: 13:00:00
  Filled 2018-10-24: qty 1

## 2018-10-24 NOTE — Progress Notes (Signed)
Physical Therapy Session Note  Patient Details  Name: [name] MRN: [id number] Date of Birth: 09-Oct-1960  Today's Date: 10/24/2018 PT Individual Time: 5102-5852 PT Individual Time Calculation (min): 64 min   Short Term Goals: Week 1:  PT Short Term Goal 1 (Week 1): Pt will ambulate at least 37ft using RW with no more than CGA PT Short Term Goal 2 (Week 1): Pt will perform bed<>chair transfers with no more than CGA PT Short Term Goal 3 (Week 1): Pt will ascend/descend 4 steps using bilateral handrails with no more than CGA PT Short Term Goal 4 (Week 1): Pt will performed bed mobility independently without using bed features  Skilled Therapeutic Interventions/Progress Updates:    Pt received supine in bed and agreeable to therapy session. Supine>sit with supervision. Stand pivot EOB>w/c with min/mod assist for balance, no AD. Transported to/from therapy gym in w/c. Therapist provided 20x18 w/c and w/c cushion for improved pt fit and improved pressure relief to increase upright activity tolerance. Ambulate 69ft x2 using RW with primarily min assist and x3 instances of L lateral LOB requiring max assist to maintain upright. Ambulated X41ft up/down ramp using RW x2 with min/mod assist for balance with mod assist primarily on descent. Performed pre-gait balance training stepping forwards/backwards  towards external target with L HHA and min/mod assist for balance - mirror for visual feedback - cuing for decreased use of L hand support. Transported back to room and ambulated ~81ft to/from bathroom using RW with min assist. Pt continent of bladder. Pt left sitting in w/c with needs in reach, chair alarm on, and meal tray setup.   Therapy Documentation Precautions:  Precautions Precautions: Fall, Other (comment) Precaution Comments: left lateral LOB; vital sign precautions: maintain HR between 55-120, SBP 100-180, and DBP 60-100 Restrictions Weight Bearing Restrictions: No  Pain:  Reports  continued pain in L knee rating of 5/10 - provided seated rest breaks as needed throughout session.    Therapy/Group: Individual Therapy  Tawana Scale, PT, DPT 10/24/2018, 7:58 AM

## 2018-10-24 NOTE — Progress Notes (Addendum)
[facility] PHYSICAL MEDICINE & REHABILITATION PROGRESS NOTE   Subjective/Complaints:  The patient is complaining of left knee pain.  He states it occurred after manual muscle testing yesterday before he came to rehab.  He denies any prior history of left knee pain but does have a history of gout.  In addition he denies any history of surgery to the left knee.  No recent falls or trauma to that area.  Review of systems occasional cough, no chest pain or shortness of breath, PT notes some wheezing, using albuterol at home  Objective:   No results found. Recent Labs    10/24/18 0515  WBC 10.0  HGB 14.3  HCT 43.1  PLT 175   Recent Labs    10/22/18 0355 10/24/18 0515  NA 142 142  K 3.6 4.2  CL 108 107  CO2 26 22  GLUCOSE 97 99  BUN 29* 29*  CREATININE 2.05* 1.87*  CALCIUM 8.7* 9.0    Intake/Output Summary (Last 24 hours) at 10/24/2018 0829 Last data filed at 10/24/2018 0725 Gross per 24 hour  Intake 578 ml  Output 2000 ml  Net -1422 ml     Physical Exam: Vital Signs Blood pressure (!) 148/90, pulse 60, temperature 98.2 F (36.8 C), temperature source Oral, resp. rate 18, height 5\' 10"  (1.778 m), weight 133.4 kg, SpO2 100 %.   General: No acute distress Mood and affect are appropriate Heart: Regular rate and rhythm no rubs murmurs or extra sounds Lungs: Clear to auscultation, breathing unlabored, no rales or wheezes Abdomen: Positive bowel sounds, soft nontender to palpation, nondistended Extremities: No clubbing, cyanosis, or edema Skin: No evidence of breakdown, no evidence of rash Neurologic: Cranial nerves II through XII intact, motor strength is4/5 RUE and  5/5 in LUEdeltoid, bicep, tricep, grip, 5/5 RLE, 4/5 LLE hip flexor, knee extensors, ankle dorsiflexor and plantar flexor  Cerebellar exam normal finger to nose to finger in left upper, mild dysmetria right upper musculoskeletal: Reduced left knee flexion and extension, suprapatellar bursa effusion noted,  minimal tenderness, no erythema Assessment/Plan: 1. 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  Physiatrist is providing close team supervision and 24 hour management of active medical problems listed below.  Physiatrist and rehab team continue to assess barriers to discharge/monitor patient progress toward functional and medical goals  Care Tool:  Bathing  Bathing activity did not occur: Refused           Bathing assist       Upper Body Dressing/Undressing Upper body dressing Upper body dressing/undressing activity did not occur (including orthotics): Refused What is the patient wearing?: Hospital gown only    Upper body assist      Lower Body Dressing/Undressing Lower body dressing    Lower body dressing activity did not occur: Refused What is the patient wearing?: Hospital gown only     Lower body assist       Toileting Toileting    Toileting assist Assist for toileting: Independent     Transfers Chair/bed transfer  Transfers assist  Chair/bed transfer activity did not occur: Refused        Locomotion Ambulation   Ambulation assist   Ambulation activity did not occur: Refused          Walk 10 feet activity   Assist           Walk 50 feet activity  Assist           Walk 150 feet activity   Assist           Walk 10 feet on uneven surface  activity   Assist           Wheelchair     Assist Will patient use wheelchair at discharge?: No             Wheelchair 50 feet with 2 turns activity    Assist            Wheelchair 150 feet activity     Assist            Medical Problem List and Plan: 1.Functional and mobility deficitssecondary to presumptive brainstem infarct, RUE weakness and ataxia,diplopia, likely left pontine  infarct -CIR PT OT evaluation, team conference in a.m. 2. Antithrombotics: -DVT/anticoagulation:Pharmaceutical:Lovenox -antiplatelet therapy: ASA/Plavix X 3 weeks followed by Plavix alone. 3. Pain Management:tylenol prn. Knee pain will check x-rays, does not appear to be gout given lack of erythema and significant tenderness 4. Mood:LCSW to follow for evaluation and support. -antipsychotic agents: N/A 5. Neuropsych: This patientiscapable of making decisions on hisown behalf. 6. Skin/Wound Care:Routine pressure relief measures. Continue unna boots for edema control--change weekly. These need to be re-applied to provide more compression. Contact WOC RN change weekly 7. Fluids/Electrolytes/Nutrition:Monitor I/O. Check lytes in am. 8. T2DM:Will monitor BS achs. Continue 70/30 insulin bid and titrate as indicated.  CBG (last 3)  Recent Labs    10/23/18 1644 10/23/18 2110 10/24/18 0602  GLUCAP 182* 103* 104*  Controlled 9.Fabry's disease/ acute on CKD:Monitor with serial checks. Follow up labs---infusion ordered for 5/29 per Dr. Marval Regal. 10.Diastolic HF/DOE: Monitor weights daily. Albuterol prnSOB.Unna boots 11.CHB s/p PPM: Continue to monitor HR bid. Will monitor weights daily as lasix on hold. 12. HTN: Monitor BP bid. On Norvasc daily. Lasix on hold at this time. Vitals:   10/23/18 2015 10/24/18 0535  BP: (!) 143/82 (!) 148/90  Pulse: 60 60  Resp: 20 18  Temp: 99.1 F (37.3 C) 98.2 F (36.8 C)  SpO2: 98% 100%  Controlled continue current medications 13. Dyslipidemia:On Lipitor 80 mg daily. 14.  Question COPD using inhalers at home non-smoker Morbid obesity- BMI 42 diet , exercise, some of this weight is fluid related as well LOS: 1 days A FACE TO FACE EVALUATION WAS PERFORMED  Charlett Blake 10/24/2018, 8:29 AM

## 2018-10-24 NOTE — Plan of Care (Signed)
  Problem: RH BLADDER ELIMINATION Goal: RH STG MANAGE BLADDER WITH ASSISTANCE Description STG Manage Bladder With Assistance. Mod I  Outcome: Progressing   Problem: RH SAFETY Goal: RH STG ADHERE TO SAFETY PRECAUTIONS W/ASSISTANCE/DEVICE Description STG Adhere to Safety Precautions With Assistance/Device. Mod I  Outcome: Progressing   Problem: RH PAIN MANAGEMENT Goal: RH STG PAIN MANAGED AT OR BELOW PT'S PAIN GOAL Description Less than 3  Outcome: Progressing   Problem: RH KNOWLEDGE DEFICIT Goal: RH STG INCREASE KNOWLEDGE OF DIABETES Description Patient will be able to describe target blood sugar goals and medicine/diet to control diabetes with cues/handouts  Outcome: Progressing Goal: RH STG INCREASE KNOWLEDGE OF HYPERTENSION Description Patient will be able to describe target blood pressure and medications for hypertension with cues/handouts  Outcome: Progressing

## 2018-10-24 NOTE — Evaluation (Signed)
Occupational Therapy Assessment and Plan  Patient Details  Name: [name] MRN: [id number] Date of Birth: 30-Dec-1960  OT Diagnosis: abnormal posture, muscle weakness (generalized), visual deficits, and coordination disorder Rehab Potential: Rehab Potential (ACUTE ONLY): Good ELOS: 7-10 days   Today's Date: 10/24/2018 OT Individual Time: 1400-1500 OT Individual Time Calculation (min): 60 min     Problem List:  Patient Active Problem List   Diagnosis Date Noted  . 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 CVA (cerebral vascular accident) ([place]) 10/20/2018  . UTI (urinary tract infection) 10/19/2018  . Complete heart block ([place]) 08/01/2018  . Upper airway cough syndrome 08/01/2018  . Lymphedema 10/06/2016  . Gout, arthritis   . 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 Onychomycosis of toenail 09/26/2014  . Diabetes mellitus type 2 in obese ([place])   . Iron deficiency anemia 07/29/2014  . CKD (chronic kidney disease), stage III ([place])   . Chronic diastolic HF (heart failure) ([place]) 03/27/2013  . Peripheral edema 07/20/2012  . Obstructive sleep apnea, suspected 06/21/2012  . Pacemaker-St.Jude 02/23/2012  . Hypertension 08/09/2011  . COPD with asthma ([place]) 08/09/2011  . Mobitz type II atrioventricular block 05/19/2011  . 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[place]) 05/19/2011    Past Medical History:  Past Medical History:  Diagnosis Date  . Bell's palsy   . Bifascicular block   . Chronic bronchitis ([place])    "seasonal; get it q yr"  . Chronic diastolic CHF (congestive heart failure) ([place]) 2010  . Chronic kidney disease (CKD), stage II (mild)    stage II to III/notes 07/23/2014  . Diastolic heart failure secondary to hypertrophic cardiomyopathy ([place]) CARDIOLOGIST-- DR Daneen Schick  . Dyspnea    increased exertion   . Edema 07/2013  . Fabry's disease ([place]) RENAL AND CARDIAC INVOVLEMENT   FOLLOWED DR [place]  . Gout, arthritis 2014   bil feet. right worse  . History of cellulitis of skin with  lymphangitis LEFT LEG  . HOCM (hypertrophic obstructive cardiomyopathy) ([place])    a. Echo 11/16: Severe LVH, EF 55-60%, abnormal GLS consistent with HOCM, no SAM, mild LAE  . Hypertension 20 years  . Lymphedema of lower extremity LEFT  >  RIGHT   "using ankle high socks at home; pump doesn't work for me" (07/23/2014)  . Pacemaker    02/12/11  . Pneumonia 08/2011  . Seasonal asthma NO INHALERS   30 years  . Second degree Mobitz II AV block    with syncope, s/p PPM  . Short of breath on exertion   . Type II diabetes mellitus ([place]) 2012   INSULIN DEPENDENT   Past Surgical History:  Past Surgical History:  Procedure Laterality Date  . CARDIAC CATHETERIZATION  03-12-2011  DR Daneen Schick   HYPERTROPHIC CARDIOMYOPATHY WITH LV CAVITY  APPEARANCE CONSISTANT WITH SIGNIFICANT APICAL HYPERTROPHY/ NORMAL LVSF / EF 55%/ EVIDENCE OF DIASTOLIC DYSFUNCTION WITH EDP OF 23-34mHg AFTER A-WAVE/ NORMAL CORONARY ARTERIES  . EVALUATION UNDER ANESTHESIA WITH FISTULECTOMY N/A 06/23/2015   Procedure: EXAM UNDER ANESTHESIA WITH POSSIBLE FISTULOTOMY;  Surgeon: JJudeth Horn MD;  [facility]  Service: General;  Laterality: N/A;  . HERNIA REPAIR  [id number]  Umbilical Hernia Repair  . INCISION AND DRAINAGE PERIRECTAL ABSCESS Left 07/29/2014   Procedure: IRRIGATION AND DEBRIDEMENT PERIRECTAL ABSCESS;  Surgeon: JDoreen Salvage MD;  [facility]  Service: General;  Laterality: Left;  Prone position  . INSERT / REPLACE / REMOVE PACEMAKER  02/12/2011   SJM implanted by Dr ARayann Hemanfor Mobitz II second  degree AV block and syncope  . IRRIGATION AND DEBRIDEMENT ABSCESS N/A 07/27/2013   Procedure: IRRIGATION AND DEBRIDEMENT OF SKIN, SOFT TISSUE AND MUSCLES OF UPPER BACK (11X19X4cm) WITH 10 BLADE AND PULSATILE LAVAGE ;  Surgeon: Gayland Curry, MD;  Location: [facility];  Service: General;  Laterality: N/A;  . UMBILICAL HERNIA REPAIR  03/22/2012   Procedure: HERNIA REPAIR UMBILICAL ADULT;  Surgeon: [place] Ruff, MD;  Location: [facility];   Service: General;  Laterality: N/A;  Umbilical Hernia Repair     Assessment & Plan Clinical Impression: Patient is a 58 y.o. year old male with History of 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bimonthly Fabrazyme infusions), COPD with asthma, T2DM, HTN, CHB s/p PPM; who was admitted on 10/19/2018 with difficulty walking,headache and left facial paresthesias.Work-up revealed staph UTI and CT head done for work-up and was negative. Neurology consulted for input and exam revealed ataxia with nystagmus and diplopia felt to be likely due to small brainstem infarct. 2D echo showed EF of 55 to 60% with moderate LVH and negative bubble study for PFO.Carotid Dopplers showed no significant ICA stenosis.Dr. Erlinda Hong recommends DAPT X 3 weeks followed by Plavix alone for stroke due to small vessel disease in setting of Fabry[s disease. Urine cultures positive for staph and patient with frequency--treated with ceftriaxone. Fabrazyme treatment to be held in setting of acute illness and to resume schedule in 2 weeks per input from nephrology. Therapy ongoing and patient continues to be limited by ataxia with diplopia and balance deficits affecting mobility and ADLs .  Patient transferred to CIR on 10/23/2018 .    Patient currently requires mod with basic self-care skills and IADL secondary to muscle weakness, decreased cardiorespiratoy endurance, decreased coordination, decreased visual acuity, decreased midline orientation and decreased sitting balance, decreased standing balance, decreased postural control and decreased balance strategies.  Prior to hospitalization, patient could complete ADLs and IADLs with modified independent .  Patient will benefit from skilled intervention to increase independence with basic self-care skills prior to discharge home independently.  Anticipate patient will require no supervision and follow up home health.  OT - End of Session Activity Tolerance: Decreased this session Endurance Deficit:  Yes Endurance Deficit Description: multiple rest breaks secondary to fatigue OT Assessment Rehab Potential (ACUTE ONLY): Good OT Barriers to Discharge: Decreased caregiver support OT Patient demonstrates impairments in the following area(s): Balance;Behavior;Endurance;Pain;Safety OT Basic ADL's Functional Problem(s): Grooming;Bathing;Dressing;Toileting OT Advanced ADL's Functional Problem(s): Simple Meal Preparation;Laundry OT Transfers Functional Problem(s): Toilet;Tub/Shower OT Additional Impairment(s): None OT Plan OT Intensity: Minimum of 1-2 x/day, 45 to 90 minutes OT Frequency: 5 out of 7 days OT Duration/Estimated Length of Stay: 7-10 days OT Treatment/Interventions: Balance/vestibular training;Neuromuscular re-education;Self Care/advanced ADL retraining;Therapeutic Exercise;DME/adaptive equipment instruction;Pain management;UE/LE Strength taining/ROM;Community reintegration;Patient/family education;UE/LE Coordination activities;Discharge planning;Functional mobility training;Psychosocial support;Therapeutic Activities OT Self Feeding Anticipated Outcome(s): n/a OT Basic Self-Care Anticipated Outcome(s): mod I  OT Toileting Anticipated Outcome(s): mod I OT Bathroom Transfers Anticipated Outcome(s): mod I  OT Recommendation Recommendations for Other Services: Neuropsych consult Patient destination: Home Follow Up Recommendations: Home health OT Equipment Recommended: Tub/shower bench;3 in 1 bedside comode   Skilled Therapeutic Intervention Upon entering the room, pt sleeping soundly but easily awakened and agreeable to OT intervention. Pt declined bathing and dressing this session. Supine >sit with supervision and pt verbalized need to go to bathroom. However, pt incontinent on bed and linens needing to be changed. Pt ambulating with RW and min A into bathroom with 2 bouts of L lateral lean in which pt was unable to  self correct. Pt standing to void with min A for standing balance  while pt holding onto RW as well for balance. Pt standing at sink for hand hygiene with min A and then taking seated rest break. Pt's vitals WFL of parameters set in orders by physician. Pt ambulating with RW to change fitted sheet with min A and increased time. Pt appeared to be very fatigued and returning to bed. Sit >supine with close supervision and OT repositioning pt for comfort. OT educated pt on OT purpose, POC, and goals with pt verbalizing understanding and agreement. Call bell and all needed items within reach upon exiting the room.   OT Evaluation Precautions/Restrictions  Precautions Precautions: Fall;Other (comment) Precaution Comments: left lateral LOB; vital sign precautions: maintain HR between 55-120, SBP 100-180, and DBP 60-100 Restrictions Weight Bearing Restrictions: No  Pain Pain Assessment Pain Scale: 0-10 Pain Score: 4  Pain Type: Acute pain Pain Location: Knee Pain Orientation: Left Pain Descriptors / Indicators: Aching Pain Onset: On-going Patients Stated Pain Goal: 2 Pain Intervention(s): RN made aware;Repositioned Home Living/Prior Functioning Home Living Family/patient expects to be discharged to:: Private residence Living Arrangements: Alone Available Help at Discharge: Friend(s), Available PRN/intermittently Type of Home: House Home Access: Stairs to enter Technical brewer of Steps: 2 Entrance Stairs-Rails: Right, Left, Can reach both Home Layout: One level Bathroom Shower/Tub: Tub/shower unit, Multimedia programmer: Associate Professor Accessibility: Yes  Lives With: Alone Prior Function Level of Independence: Independent with homemaking with ambulation, Independent with gait, Independent with transfers, Independent with basic ADLs  Able to Take Stairs?: Yes Driving: Yes 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 Leisure: Hobbies-yes (Comment) Comments: 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 ADL   Vision Baseline Vision/History: No visual deficits Patient Visual Report: Diplopia;Other (comment) Vision Assessment?: Yes Eye Alignment: Impaired (comment)(R eye esotropia) Perception    Praxis   Cognition Overall Cognitive Status: Within Functional Limits for tasks assessed Arousal/Alertness: Awake/alert Orientation Level: Person;Place;Situation Person: Oriented Place: Oriented Situation: Oriented Year: 2020 Month: May Day of Week: Correct Memory: Appears intact Immediate Memory Recall: Sock;Blue;Bed Memory Recall: Sock;Blue;Bed Memory Recall Sock: Without Cue Memory Recall Blue: Without Cue Memory Recall Bed: Without Cue Sensation Sensation Light Touch: Appears Intact Hot/Cold: Not tested Proprioception: Appears Intact Stereognosis: Not tested Coordination Gross Motor Movements are Fluid and Coordinated: No Fine Motor Movements are Fluid and Coordinated: Yes Coordination and Movement Description: gross motor movements impaired due to impaired strength and L knee pain Motor  Motor Motor: Other (comment) Motor - Skilled Clinical Observations: generalized weakness Mobility  Bed Mobility Bed Mobility: Supine to Sit;Sit to Supine Supine to Sit: Supervision/Verbal cueing Transfers Sit to Stand: Minimal Assistance - Patient > 75% Stand to Sit: Minimal Assistance - Patient > 75%  Trunk/Postural Assessment  Cervical Assessment Cervical Assessment: Exceptions to WFL(forward head) Thoracic Assessment Thoracic Assessment: Exceptions to WFL(kyphotic) Lumbar Assessment Lumbar Assessment: Exceptions to WFL(posterior pelvic tilt) Postural Control Postural Control: Deficits on evaluation  Balance Balance Balance Assessed: Yes Dynamic Sitting Balance Dynamic Sitting - Balance Support: During functional activity;Feet supported Dynamic Sitting - Level of Assistance: 5: Stand by assistance Dynamic Standing  Balance Dynamic Standing - Balance Support: During functional activity Dynamic Standing - Level of Assistance: 4: Min assist;3: Mod assist Extremity/Trunk Assessment RUE Assessment RUE Assessment: Within Functional Limits LUE Assessment LUE Assessment: Within Functional Limits     Refer to Care Plan for Long Term Goals  Recommendations for  other services: Neuropsych   Discharge Criteria: Patient will be discharged from OT if patient refuses treatment 3 consecutive times without medical reason, if treatment goals not met, if there is a change in medical status, if patient makes no progress towards goals or if patient is discharged from hospital.  The above assessment, treatment plan, treatment alternatives and goals were discussed and mutually agreed upon: by patient  Gypsy Decant 10/24/2018, 4:36 PM

## 2018-10-24 NOTE — Progress Notes (Signed)
Inpatient Rehabilitation  Patient information reviewed and entered into eRehab system by Nkosi Cortright M. Moishy Laday, M.A., CCC/SLP, PPS Coordinator.  Information including medical coding, functional ability and quality indicators will be reviewed and updated through discharge.    

## 2018-10-24 NOTE — Progress Notes (Signed)
Social Work  Social Work Assessment and Plan  Patient Details  Name: [name] MRN: [id number] Date of Birth: 11-03-1960  Today's Date: 10/24/2018  Problem List:  Patient Active Problem List   Diagnosis Date Noted  . 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 CVA (cerebral vascular accident) ([place]) 10/20/2018  . UTI (urinary tract infection) 10/19/2018  . Complete heart block ([place]) 08/01/2018  . Upper airway cough syndrome 08/01/2018  . Lymphedema 10/06/2016  . Gout, arthritis   . 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 Onychomycosis of toenail 09/26/2014  . Diabetes mellitus type 2 in obese ([place])   . Iron deficiency anemia 07/29/2014  . CKD (chronic kidney disease), stage III ([place])   . Chronic diastolic HF (heart failure) ([place]) 03/27/2013  . Peripheral edema 07/20/2012  . Obstructive sleep apnea, suspected 06/21/2012  . Pacemaker-St.Jude 02/23/2012  . Hypertension 08/09/2011  . COPD with asthma ([place]) 08/09/2011  . Mobitz type II atrioventricular block 05/19/2011  . 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 Bell's palsy   . Bifascicular block   . Chronic bronchitis ([place])    "seasonal; get it q yr"  . Chronic diastolic CHF (congestive heart failure) ([place]) 2010  . Chronic kidney disease (CKD), stage II (mild)    stage II to III/notes 07/23/2014  . Diastolic heart failure secondary to hypertrophic cardiomyopathy ([place]) CARDIOLOGIST-- DR Daneen Schick  . Dyspnea    increased exertion   . Edema 07/2013  . 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 Gout, arthritis 2014   bil feet. right worse  . History of cellulitis of skin with lymphangitis LEFT LEG  . HOCM (hypertrophic obstructive cardiomyopathy) ([place])    a. Echo 11/16: Severe LVH, EF 55-60%, abnormal GLS consistent with HOCM, no SAM, mild LAE  . Hypertension 20 years  . Lymphedema of lower extremity LEFT  >  RIGHT   "using ankle  high socks at home; pump doesn't work for me" (07/23/2014)  . Pacemaker    02/12/11  . Pneumonia 08/2011  . Seasonal asthma NO INHALERS   30 years  . Second degree Mobitz II AV block    with syncope, s/p PPM  . Short of breath on exertion   . Type II diabetes mellitus ([place]) 2012   INSULIN DEPENDENT   Past Surgical History:  Past Surgical History:  Procedure Laterality Date  . CARDIAC CATHETERIZATION  03-12-2011  DR Daneen Schick   HYPERTROPHIC CARDIOMYOPATHY WITH LV CAVITY  APPEARANCE CONSISTANT WITH SIGNIFICANT APICAL HYPERTROPHY/ NORMAL LVSF / EF 55%/ EVIDENCE OF DIASTOLIC DYSFUNCTION WITH EDP OF 23-32mmHg AFTER A-WAVE/ NORMAL CORONARY ARTERIES  . EVALUATION UNDER ANESTHESIA WITH FISTULECTOMY N/A 06/23/2015   Procedure: EXAM UNDER ANESTHESIA WITH POSSIBLE FISTULOTOMY;  Surgeon: Judeth Horn, MD;  Location: [facility];  Service: General;  Laterality: N/A;  . HERNIA REPAIR  [id number]   Umbilical Hernia Repair  . INCISION AND DRAINAGE PERIRECTAL ABSCESS Left 07/29/2014   Procedure: IRRIGATION AND DEBRIDEMENT PERIRECTAL ABSCESS;  Surgeon: Doreen Salvage, MD;  Location: [facility];  Service: General;  Laterality: Left;  Prone position  . INSERT / REPLACE / REMOVE PACEMAKER  02/12/2011   SJM implanted by Dr Rayann Heman for Mobitz II second degree AV block and syncope  . IRRIGATION AND DEBRIDEMENT ABSCESS N/A 07/27/2013   Procedure: IRRIGATION AND DEBRIDEMENT OF SKIN, SOFT TISSUE AND MUSCLES OF UPPER BACK (11X19X4cm) WITH 10 BLADE AND PULSATILE LAVAGE ;  Surgeon: Gayland Curry, MD;  Location: [facility];  Service: General;  Laterality: N/A;  . UMBILICAL HERNIA REPAIR  03/22/2012   Procedure: HERNIA REPAIR UMBILICAL ADULT;  Surgeon: Leighton Ruff, MD;  Location: [facility];  Service: General;  Laterality: N/A;  Umbilical Hernia Repair    Social History:  reports that he has never smoked. He has never used smokeless tobacco. He reports current alcohol use. He reports that he does not use drugs.  Family / Support Systems Marital Status:  Divorced Patient Roles: Other (Comment)(Friend & sibling) Other Supports: Darlene volenberg [phone] in [place] Anticipated Caregiver: Arbie Cookey Ability/Limitations of Caregiver: Only will be checking on him Caregiver Availability: Intermittent Family Dynamics: Close with family up in [place] all work and have families. He has a family friend-Carol who will check on him and may help him some. He does not want to burden her with anything.  Social History Preferred language: English Religion: None Cultural Background: No issues Education: HS Read: Yes Write: Yes Employment Status: Disabled Date Retired/Disabled/Unemployed: 2014 Legal History/Current Legal Issues: No issues Guardian/Conservator: None-according to MD pt is capable of making his own decisions while here.    Abuse/Neglect Abuse/Neglect Assessment Can Be Completed: Yes Physical Abuse: Denies Verbal Abuse: Denies Sexual Abuse: Denies Exploitation of patient/patient's resources: Denies Self-Neglect: Denies  Emotional Status Pt's affect, behavior and adjustment status: Pt is motivated to do well and recover from this stroke. He is making progress daily with his balance. He needs to be mod/i with his function before going home alone. He has always been independent and taken care of himself. Recent Psychosocial Issues: other health issues-gets infusions bimonthly for his Fabry. He drove himself to these Psychiatric History: No history deferred depression screen due to adjusting to the new unit and his stroke. Do feel he would benefit from seeing neuro-psych while here. Substance Abuse History: No issues  Patient / Family Perceptions, Expectations & Goals Pt/Family understanding of illness & functional limitations: Pt can explain his stroke and deficits, his main issue is his knee issues and he has spoken with the MD regarding this. He is improving and pleased with this. He feels he can get to the levle he needs  to be to go home Premorbid pt/family roles/activities: Sibling, friend, retiree, home owner, etc Anticipated changes in roles/activities/participation: resume Pt/family expectations/goals: Pt states: " I need to get to the level I can take care of myself I don't have anyone to do this for me."    [employer]: Other (Comment)([facility] Premorbid Home Care/DME Agencies: Other (Comment)(has rw and cane for previous back surgery) Transportation available at discharge: self Resource referrals recommended: Neuropsychology, Support group (specify)  Discharge Planning Living Arrangements: Alone Support Systems: Other relatives, Friends/neighbors Type of Residence: Private residence Insurance Resources: Multimedia programmer (specify)(UHC-Medicare) Financial Resources: SSD Financial Screen Referred: No Living Expenses: Medical laboratory scientific officer Management: Patient Does the patient have any problems obtaining your medications?: No Home Management: Self Patient/Family Preliminary Plans: Return home at a mod/i level if this can not be reache dwill ned to go to a NH from rehab. He has family friend who can check on him but that is it. Will need to work on transportation issue since will not be able to driv ewhen discharged from rehab. Sw Barriers to Discharge: Decreased caregiver support, Lack of/limited family support Sw Barriers to Discharge Comments: Does not have 24 hr care  Social Work Anticipated Follow Up Needs: HH/OP, Support Group  Clinical  Impression Pleasant gentleman who is motivated to do well and recover from this stroke. He needs to be mod/i to be able to go home safely alone from rehab. Will await therapy team evaluations and have neuro-psych see while here.  Elease Hashimoto 10/24/2018, 10:27 AM

## 2018-10-24 NOTE — Care Management Note (Signed)
Inpatient [facility] Individual Statement of Services  Patient Name:  [name]  Date:  10/24/2018  Welcome to the [facility].  Our goal is to provide you with an individualized program based on your diagnosis and situation, designed to meet your specific needs.  With this comprehensive rehabilitation program, you will be expected to participate in at least 3 hours of rehabilitation therapies Monday-Friday, with modified therapy programming on the weekends.  Your rehabilitation program will include the following services:  Physical Therapy (PT), Occupational Therapy (OT), 24 hour per day rehabilitation nursing, Neuropsychology, Case Management (Social Worker), Rehabilitation Medicine, Nutrition Services and Pharmacy Services  Weekly team conferences will be held on Wednesday to discuss your progress.  Your Social Worker will talk with you frequently to get your input and to update you on team discussions.  Team conferences with you and your family in attendance may also be held.  Expected length of stay: 9-11 days  Overall anticipated outcome: independent with device  Depending on your progress and recovery, your program may change. Your Social Worker will coordinate services and will keep you informed of any changes. Your Social Worker's name and contact numbers are listed  below.  The following services may also be recommended but are not provided by the [facility] will be made to provide these services after discharge if needed.  Arrangements include referral to agencies that provide these services.  Your insurance has been verified to be:  UHC-Medicare Your primary doctor is:  Karle Plumber  Pertinent information will be shared with your doctor and your insurance company.  Social Worker:  Ovidio Kin, [place] or ([phone]  Information discussed with and copy given to patient by: Elease Hashimoto, 10/24/2018, 10:32 AM

## 2018-10-24 NOTE — Evaluation (Signed)
Physical Therapy Assessment and Plan  Patient Details  Name: [name] MRN: [id number] Date of Birth: 1961/01/24  PT Diagnosis: Abnormality of gait, Coordination disorder, Difficulty walking, Edema, Muscle weakness and Pain in L knee Rehab Potential: Good ELOS: 9-11days   Today's Date: 10/24/2018 PT Individual Time: 4656-8127 PT Individual Time Calculation (min): 77 min    Problem List:  Patient Active Problem List   Diagnosis Date Noted  . 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 CVA (cerebral vascular accident) ([place]) 10/20/2018  . UTI (urinary tract infection) 10/19/2018  . Complete heart block ([place]) 08/01/2018  . Upper airway cough syndrome 08/01/2018  . Lymphedema 10/06/2016  . Gout, arthritis   . 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 Onychomycosis of toenail 09/26/2014  . Diabetes mellitus type 2 in obese ([place])   . Iron deficiency anemia 07/29/2014  . CKD (chronic kidney disease), stage III ([place])   . Chronic diastolic HF (heart failure) ([place]) 03/27/2013  . Peripheral edema 07/20/2012  . Obstructive sleep apnea, suspected 06/21/2012  . Pacemaker-St.Jude 02/23/2012  . Hypertension 08/09/2011  . COPD with asthma ([place]) 08/09/2011  . Mobitz type II atrioventricular block 05/19/2011  . 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 Bell's palsy   . Bifascicular block   . Chronic bronchitis ([place])    "seasonal; get it q yr"  . Chronic diastolic CHF (congestive heart failure) ([place]) 2010  . Chronic kidney disease (CKD), stage II (mild)    stage II to III/notes 07/23/2014  . Diastolic heart failure secondary to hypertrophic cardiomyopathy ([place]) CARDIOLOGIST-- DR Daneen Schick  . Dyspnea    increased exertion   . Edema 07/2013  . 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 Gout, arthritis 2014   bil feet. right worse  . History of cellulitis of skin with lymphangitis LEFT LEG  . HOCM  (hypertrophic obstructive cardiomyopathy) ([place])    a. Echo 11/16: Severe LVH, EF 55-60%, abnormal GLS consistent with HOCM, no SAM, mild LAE  . Hypertension 20 years  . Lymphedema of lower extremity LEFT  >  RIGHT   "using ankle high socks at home; pump doesn't work for me" (07/23/2014)  . Pacemaker    02/12/11  . Pneumonia 08/2011  . Seasonal asthma NO INHALERS   30 years  . Second degree Mobitz II AV block    with syncope, s/p PPM  . Short of breath on exertion   . Type II diabetes mellitus ([place]) 2012   INSULIN DEPENDENT   Past Surgical History:  Past Surgical History:  Procedure Laterality Date  . CARDIAC CATHETERIZATION  03-12-2011  DR Daneen Schick   HYPERTROPHIC CARDIOMYOPATHY WITH LV CAVITY  APPEARANCE CONSISTANT WITH SIGNIFICANT APICAL HYPERTROPHY/ NORMAL LVSF / EF 55%/ EVIDENCE OF DIASTOLIC DYSFUNCTION WITH EDP OF 23-78mHg AFTER A-WAVE/ NORMAL CORONARY ARTERIES  . EVALUATION UNDER ANESTHESIA WITH FISTULECTOMY N/A 06/23/2015   Procedure: EXAM UNDER ANESTHESIA WITH POSSIBLE FISTULOTOMY;  Surgeon: JJudeth Horn MD;  Location: [facility]  Service: General;  Laterality: N/A;  . HERNIA REPAIR  [id number]  Umbilical Hernia Repair  . INCISION AND DRAINAGE PERIRECTAL ABSCESS Left 07/29/2014   Procedure: IRRIGATION AND DEBRIDEMENT PERIRECTAL ABSCESS;  Surgeon: JDoreen Salvage MD;  Location[facility]  Service: General;  Laterality: Left;  Prone position  . INSERT / REPLACE / REMOVE PACEMAKER  02/12/2011   SJM implanted by Dr ARayann Hemanfor Mobitz II second degree  AV block and syncope  . IRRIGATION AND DEBRIDEMENT ABSCESS N/A 07/27/2013   Procedure: IRRIGATION AND DEBRIDEMENT OF SKIN, SOFT TISSUE AND MUSCLES OF UPPER BACK (11X19X4cm) WITH 10 BLADE AND PULSATILE LAVAGE ;  Surgeon: Gayland Curry, MD;  Location: [facility];  Service: General;  Laterality: N/A;  . UMBILICAL HERNIA REPAIR  03/22/2012   Procedure: HERNIA REPAIR UMBILICAL ADULT;  Surgeon: Leighton Ruff, MD;  Location: [facility];  Service: General;  Laterality: N/A;   Umbilical Hernia Repair     Assessment & Plan Clinical Impression: Patient is a 58 y.o. year old male with History of 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bimonthly Fabrazyme infusions), COPD with asthma, T2DM, HTN, CHB s/p PPM; who was admitted on 10/19/2018 with difficulty walking,headache and left facial paresthesias.Work-up revealed staph UTI and CT head done for work-up and was negative. Neurology consulted for input and exam revealed ataxia with nystagmus and diplopia felt to be likely due to small brainstem infarct. 2D echo showed EF of 55 to 60% with moderate LVH and negative bubble study for PFO.Carotid Dopplers showed no significant ICA stenosis.Dr. Erlinda Hong recommends DAPT X 3 weeks followed by Plavix alone for stroke due to small vessel disease in setting of Fabrys disease. Urine cultures positive for staph and patient with frequency--treated with ceftriaxone. Fabrazyme treatment to be held in setting of acute illness and to resume schedule in 2 weeks per input from nephrology. Therapy ongoing and patient continues to be limited by ataxia with diplopia and balance deficits affecting mobility and ADLs. CIR recommended due to functional decline. Patient transferred to CIR on 10/23/2018 .   Patient currently requires mod with mobility secondary to muscle weakness, decreased cardiorespiratoy endurance, unbalanced muscle activation and decreased coordination, decreased midline orientation,   and decreased sitting balance, decreased standing balance, decreased postural control and decreased balance strategies.  Prior to hospitalization, patient was independent  with mobility and lived with Alone in a House home.  Home access is 2Stairs to enter.  Patient will benefit from skilled PT intervention to maximize safe functional mobility, minimize fall risk and decrease caregiver burden for planned discharge home with intermittent assist.  Anticipate patient will benefit from follow up [facility] at discharge.  PT - End of  Session Activity Tolerance: Tolerates 30+ min activity with multiple rests Endurance Deficit: Yes Endurance Deficit Description: multiple rest breaks secondary to fatigue PT Assessment Rehab Potential (ACUTE/IP ONLY): Good PT Barriers to Discharge: Decreased caregiver support;Inaccessible home environment;Lack of/limited family support PT Patient demonstrates impairments in the following area(s): Balance;Behavior;Pain;Edema;Endurance;Motor PT Transfers Functional Problem(s): Bed Mobility;Bed to Chair;Car;Furniture PT Locomotion Functional Problem(s): Ambulation;Stairs PT Plan PT Intensity: Minimum of 1-2 x/day ,45 to 90 minutes PT Frequency: 5 out of 7 days PT Duration Estimated Length of Stay: 9-11days PT Treatment/Interventions: Ambulation/gait training;Cognitive remediation/compensation;Discharge planning;DME/adaptive equipment instruction;Functional mobility training;Pain management;Psychosocial support;Splinting/orthotics;Therapeutic Activities;UE/LE Strength taining/ROM;Visual/perceptual remediation/compensation;Balance/vestibular training;Community reintegration;Disease management/prevention;Functional electrical stimulation;Neuromuscular re-education;Patient/family education;Skin care/wound management;Stair training;Therapeutic Exercise;UE/LE Coordination activities PT Transfers Anticipated Outcome(s): mod-I transfers PT Locomotion Anticipated Outcome(s): mod-I ambulating with LRAD PT Recommendation Recommendations for Other Services: Therapeutic Recreation consult Therapeutic Recreation Interventions: Pet therapy;Kitchen group;Stress management;Outing/community reintergration Follow Up Recommendations: Home health PT Patient destination: Home Equipment Recommended: To be determined;Other (comment) Equipment Details: has RW and cane  Skilled Therapeutic Intervention Evaluation completed (see details above and below) with education on PT POC and goals and individual treatment  initiated with focus on bed mobility, transfers, gait, stair navigation, car transfers, as well as pt education on daily therapy schedule, weekly team meetings, purpose of PT  evaluation, and other CIR information. Pt received supine in bed and agreeable to therapy session. Pt wearing glasses with R eye medial visual field taped throughout session as pt reports this decreases his diplopia. Supine>sit without bed features with supervision for safety. Stand pivot transfer EOB>w/c with mod assist for balance. Transported to/from gym in w/c. Sit<>stand w/c<>HHA/RW with min assist for balance throughout session. Ambulated 54f with L HHA and mod assist for balance due to L lateral lean. Ascended/descended 4 steps using bilateral handrails and step-to pattern with min assist for balance - therapist demonstrating and providing cuing for proper sequencing due to L knee pain. Performed stand pivot car transfer at SUV height to simulate pt's vehicle with min assist for balance and increased pt effort for placing B LEs into/out of car. Pt requesting to use bathroom. Transported back to room in w/c and ambulated ~129fx2 in/out of bathroom using RW with min assist for balance. Pt continent of urine while standing with CGA for steadying. Ambulated ~3f56fsing RW with min assist for balance and left sitting in recliner with needs in reach, chair alarm on, B LEs elevated, and ice applied to L knee for pain management at end of session.  PT Evaluation Precautions/Restrictions Precautions Precautions: Fall;Other (comment) Precaution Comments: left lateral LOB; vital sign precautions: maintain HR between 55-120, SBP 100-180, and DBP 60-100 Restrictions Weight Bearing Restrictions: No Pain Pain Assessment Pain Scale: 0-10 Pain Score: 5  Pain Location: Knee Pain Orientation: Left Pain Descriptors / Indicators: Aching;Throbbing Pain Intervention(s): Repositioned;Rest;RN made aware Home Living/Prior Functioning Home  Living Available Help at Discharge: Friend(s);Available PRN/intermittently(family friend lives 5 minutes away) Type of Home: House Home Access: Stairs to enter EntTechnical brewer Steps: 2 Entrance Stairs-Rails: Right;Left;Can reach both Home Layout: One level  Lives With: Alone Prior Function Level of Independence: Independent with homemaking with ambulation;Independent with gait;Independent with transfers  Able to Take Stairs?: Yes Driving: Yes 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 Leisure: Hobbies-yes (Comment) Comments: coaching peewee football Vision/Perception  Perception Perception: Within Functional Limits Praxis Praxis: Intact  Cognition Overall Cognitive Status: No family/caregiver present to determine baseline cognitive functioning Arousal/Alertness: Awake/alert Orientation Level: Oriented to person;Oriented to place;Oriented to time;Oriented to situation Attention: Focused;Sustained Focused Attention: Appears intact Sustained Attention: Appears intact Memory: Appears intact Sensation Sensation Light Touch: Appears Intact(difficult to assess due to bilateral unna boots) Coordination Gross Motor Movements are Fluid and Coordinated: No Coordination and Movement Description: gross motor movements impaired due to impaired strength and L knee pain Motor  Motor Motor: Other (comment) Motor - Skilled Clinical Observations: generalized weakness  Mobility Bed Mobility Bed Mobility: Supine to Sit;Sit to Supine Supine to Sit: Supervision/Verbal cueing Transfers Transfers: Sit to Stand;Stand to Sit;Stand Pivot Transfers Sit to Stand: Minimal Assistance - Patient > 75% Stand to Sit: Minimal Assistance - Patient > 75% Stand Pivot Transfers: Moderate Assistance - Patient 50 - 74% Stand Pivot Transfer Details: Verbal cues for technique;Verbal cues for sequencing;Tactile cues for posture;Visual  cues/gestures for precautions/safety;Tactile cues for sequencing;Tactile cues for initiation Transfer (Assistive device): 1 person hand held assist Locomotion  Gait Ambulation: Yes Gait Assistance: Moderate Assistance - Patient 50-74% Gait Distance (Feet): 39 Feet Assistive device: 1 person hand held assist Gait Assistance Details: Tactile cues for initiation;Tactile cues for sequencing;Verbal cues for sequencing;Verbal cues for technique;Verbal cues for gait pattern;Manual facilitation for weight shifting Gait Gait: Yes Gait Pattern: Impaired Gait Pattern: Decreased step length - right;Decreased step length -  left;Decreased stance time - right;Decreased weight shift to right;Wide base of support Gait velocity: decreased Stairs / Additional Locomotion Stairs: Yes Stairs Assistance: Minimal Assistance - Patient > 75% Stair Management Technique: Two rails Number of Stairs: 4 Height of Stairs: 6 Wheelchair Mobility Wheelchair Mobility: No  Trunk/Postural Assessment  Cervical Assessment Cervical Assessment: Within Functional Limits Thoracic Assessment Thoracic Assessment: Within Functional Limits Lumbar Assessment Lumbar Assessment: Within Functional Limits Postural Control Postural Control: Deficits on evaluation Righting Reactions: delayed and inadequate Protective Responses: delayed and inadequte Postural Limitations: decreased  Balance Balance Balance Assessed: Yes Static Sitting Balance Static Sitting - Balance Support: Feet supported Static Sitting - Level of Assistance: 5: Stand by assistance Dynamic Sitting Balance Dynamic Sitting - Balance Support: During functional activity;Feet supported Dynamic Sitting - Level of Assistance: 5: Stand by assistance Static Standing Balance Static Standing - Balance Support: During functional activity;Bilateral upper extremity supported Static Standing - Level of Assistance: 4: Min assist Dynamic Standing Balance Dynamic Standing -  Balance Support: During functional activity;No upper extremity supported Dynamic Standing - Level of Assistance: 3: Mod assist Extremity Assessment      RLE Assessment RLE Assessment: Exceptions to WFL(Grossly 4/5 ) LLE Assessment LLE Assessment: Exceptions to WFL(Grossly 3/5 to 4/5 with limitations due to pain)    Refer to Care Plan for Long Term Goals  Recommendations for other services: Therapeutic Recreation  Pet therapy, Kitchen group, Stress management and Outing/community reintegration  Discharge Criteria: Patient will be discharged from PT if patient refuses treatment 3 consecutive times without medical reason, if treatment goals not met, if there is a change in medical status, if patient makes no progress towards goals or if patient is discharged from hospital.  The above assessment, treatment plan, treatment alternatives and goals were discussed and mutually agreed upon: by patient  Tawana Scale, PT, DPT 10/24/2018, 9:03 AM

## 2018-10-25 ENCOUNTER — Inpatient Hospital Stay (HOSPITAL_COMMUNITY): Payer: Medicare Other | Admitting: Occupational Therapy

## 2018-10-25 ENCOUNTER — Inpatient Hospital Stay (HOSPITAL_COMMUNITY): Payer: Medicare Other | Admitting: Physical Therapy

## 2018-10-25 LAB — GLUCOSE, CAPILLARY
Glucose-Capillary: 80 mg/dL (ref 70–99)
Glucose-Capillary: 137 mg/dL — ABNORMAL HIGH (ref 70–99)
Glucose-Capillary: 104 mg/dL — ABNORMAL HIGH (ref 70–99)
Glucose-Capillary: 211 mg/dL — ABNORMAL HIGH (ref 70–99)
Glucose-Capillary: 150 mg/dL — ABNORMAL HIGH (ref 70–99)

## 2018-10-25 LAB — SYNOVIAL CELL COUNT + DIFF, W/ CRYSTALS
Eosinophils-Synovial: 0 % (ref 0–1)
Lymphocytes-Synovial Fld: 1 % (ref 0–20)
Monocyte-Macrophage-Synovial Fluid: 8 % — ABNORMAL LOW (ref 50–90)
Neutrophil, Synovial: 91 % — ABNORMAL HIGH (ref 0–25)
WBC, Synovial: 73000 /mm3 — ABNORMAL HIGH (ref 0–200)

## 2018-10-25 MED ORDER — METHYLPREDNISOLONE ACETATE 80 MG/ML IJ SUSP
80.0000 mg | Freq: Once | INTRAMUSCULAR | Status: AC
  Administered 2018-10-25: 15:00:00 80 mg via INTRA_ARTICULAR
  Filled 2018-10-25: qty 1

## 2018-10-25 MED ORDER — BUPIVACAINE HCL (PF) 0.5 % IJ SOLN
10.0000 mL | Freq: Once | INTRAMUSCULAR | Status: AC
  Administered 2018-10-25: 15:00:00 10 mL
  Filled 2018-10-25: qty 10

## 2018-10-25 NOTE — Progress Notes (Signed)
Occupational Therapy Session Note  Patient Details  Name: [name] MRN: [id number] Date of Birth: August 07, 1960  Today's Date: 10/25/2018 OT Individual Time: 1000-1027 and 1445- 1514 OT Individual Time Calculation (min): 27 min and 29 minutes   Short Term Goals: Week 1:  OT Short Term Goal 1 (Week 1): STGs=LTGs secondary to estimated short LOS  Skilled Therapeutic Interventions/Progress Updates:   Session 1:Upon entering the room, pt supine in bed and reports increased pain in L knee. MD arrives for assessment and discussed possible procedure to drain fluids. Pt is agreeable. Pt also reporting "choking on breakfast" and screen places for SLP for safety with meal. OT placing ice onto L knee for pain management and dicussed plans for afternoon session. Call bell and all needed items within reach upon exiting the room.   Session 2: Upon entering the room, pt supine in bed and reports having fluid drawn out of and an injection in L knee prior to arrival. Pt is agreeable to OT intervention with encouragement this session. Pt performed supine >sit with supervision and stand pivot transfer into wheelchair with min A and use of RW. OT assisted pt to gym for standing balance and coordination activity at dynavision board. Pt reports not dizziness with visual scanning of board while wearing glasses. Pt standing for two minutes will engaging board with 4 LOB anteriorly toward board surface and pt sticking hands out to stop himself. Pt standing second time on air ex pad with 2 LOB to the R requiring min - mod A to self correct. Pt does appear to over and undershoot targets on board. On second attempt, average reaction time on L was 2.2 seconds and average reaction time on the R is 1.7 seconds. OT assisted pt back to room for time management. Pt standing and transferred back into bed with min guard and use of RW. Sit >supine with supervision. Bed alarm activated and call bell within reach.   Therapy  Documentation Precautions:  Precautions Precautions: Fall, Other (comment) Precaution Comments: left lateral LOB; vital sign precautions: maintain HR between 55-120, SBP 100-180, and DBP 60-100 Restrictions Weight Bearing Restrictions: No General:   Vital Signs: Therapy Vitals Pulse Rate: 63 BP: (!) 146/78 Pain: Pain Assessment Pain Scale: 0-10 Pain Score: 4  Pain Type: Acute pain Pain Location: Knee Pain Orientation: Left Pain Descriptors / Indicators: Aching Pain Frequency: Intermittent Pain Onset: On-going Pain Intervention(s): Relaxation   Therapy/Group: Individual Therapy  Gypsy Decant 10/25/2018, 11:48 AM

## 2018-10-25 NOTE — Consult Note (Signed)
Reason for Consult:Left knee pain Referring Physician: A Kirsteins  [name] is an 58 y.o. male.  HPI: [name] is in CIR 2/2 CVA. When undergoing therapy last Thursday he was doing a SLR and had some knee pain. Since then it has gotten more swollen and painful to the point that it's inhibiting his progress. He says this happened once before in 2013 and he had fluid taken from the knee but he doesn't remember if there was a diagnosis involved. He denies fevers, chills, sweats, N/V. Does have hx/o gout, always in feet.  Past Medical History:  Diagnosis Date  . Bell's palsy   . Bifascicular block   . Chronic bronchitis ([place])    "seasonal; get it q yr"  . Chronic diastolic CHF (congestive heart failure) ([place]) 2010  . Chronic kidney disease (CKD), stage II (mild)    stage II to III/notes 07/23/2014  . Diastolic heart failure secondary to hypertrophic cardiomyopathy ([place]) CARDIOLOGIST-- DR Daneen Schick  . Dyspnea    increased exertion   . Edema 07/2013  . 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 Gout, arthritis 2014   bil feet. right worse  . History of cellulitis of skin with lymphangitis LEFT LEG  . HOCM (hypertrophic obstructive cardiomyopathy) ([place])    a. Echo 11/16: Severe LVH, EF 55-60%, abnormal GLS consistent with HOCM, no SAM, mild LAE  . Hypertension 20 years  . Lymphedema of lower extremity LEFT  >  RIGHT   "using ankle high socks at home; pump doesn't work for me" (07/23/2014)  . Pacemaker    02/12/11  . Pneumonia 08/2011  . Seasonal asthma NO INHALERS   30 years  . Second degree Mobitz II AV block    with syncope, s/p PPM  . Short of breath on exertion   . Type II diabetes mellitus ([place]) 2012   INSULIN DEPENDENT    Past Surgical History:  Procedure Laterality Date  . CARDIAC CATHETERIZATION  03-12-2011  DR Daneen Schick   HYPERTROPHIC CARDIOMYOPATHY WITH LV CAVITY  APPEARANCE CONSISTANT WITH SIGNIFICANT APICAL HYPERTROPHY/ NORMAL  LVSF / EF 55%/ EVIDENCE OF DIASTOLIC DYSFUNCTION WITH EDP OF 23-10mmHg AFTER A-WAVE/ NORMAL CORONARY ARTERIES  . EVALUATION UNDER ANESTHESIA WITH FISTULECTOMY N/A 06/23/2015   Procedure: EXAM UNDER ANESTHESIA WITH POSSIBLE FISTULOTOMY;  Surgeon: Judeth Horn, MD;  Location: [facility];  Service: General;  Laterality: N/A;  . HERNIA REPAIR  2130   Umbilical Hernia Repair  . INCISION AND DRAINAGE PERIRECTAL ABSCESS Left 07/29/2014   Procedure: IRRIGATION AND DEBRIDEMENT PERIRECTAL ABSCESS;  Surgeon: Doreen Salvage, MD;  Location: [facility];  Service: General;  Laterality: Left;  Prone position  . INSERT / REPLACE / REMOVE PACEMAKER  02/12/2011   SJM implanted by Dr Rayann Heman for Mobitz II second degree AV block and syncope  . IRRIGATION AND DEBRIDEMENT ABSCESS N/A 07/27/2013   Procedure: IRRIGATION AND DEBRIDEMENT OF SKIN, SOFT TISSUE AND MUSCLES OF UPPER BACK (11X19X4cm) WITH 10 BLADE AND PULSATILE LAVAGE ;  Surgeon: Gayland Curry, MD;  Location: [facility];  Service: General;  Laterality: N/A;  . UMBILICAL HERNIA REPAIR  03/22/2012   Procedure: HERNIA REPAIR UMBILICAL ADULT;  Surgeon: Leighton Ruff, MD;  Location: [facility];  Service: General;  Laterality: N/A;  Umbilical Hernia Repair     Family History  Problem Relation Age of Onset  . Stroke Mother   . Fabry's disease Mother   . Fabry's disease Brother   . Colon cancer  Neg Hx     Social History:  reports that he has never smoked. He has never used smokeless tobacco. He reports current alcohol use. He reports that he does not use drugs.  Allergies:  Allergies  Allergen Reactions  . Lisinopril Anaphylaxis  . Shellfish Allergy Hives and Swelling    Medications: I have reviewed the patient's current medications.  Results for orders placed or performed during the hospital encounter of 10/23/18 (from the past 48 hour(s))  Glucose, capillary     Status: Abnormal   Collection Time: 10/23/18  4:44 PM  Result Value Ref Range   Glucose-Capillary 182 (H) 70 - 99 mg/dL   Glucose, capillary     Status: Abnormal   Collection Time: 10/23/18  9:10 PM  Result Value Ref Range   Glucose-Capillary 103 (H) 70 - 99 mg/dL  CBC WITH DIFFERENTIAL     Status: Abnormal   Collection Time: 10/24/18  5:15 AM  Result Value Ref Range   WBC 10.0 4.0 - 10.5 K/uL   RBC 4.81 4.22 - 5.81 MIL/uL   Hemoglobin 14.3 13.0 - 17.0 g/dL   HCT 43.1 39.0 - 52.0 %   MCV 89.6 80.0 - 100.0 fL   MCH 29.7 26.0 - 34.0 pg   MCHC 33.2 30.0 - 36.0 g/dL   RDW 13.3 11.5 - 15.5 %   Platelets 175 150 - 400 K/uL   nRBC 0.0 0.0 - 0.2 %   Neutrophils Relative % 61 %   Neutro Abs 6.1 1.7 - 7.7 K/uL   Lymphocytes Relative 23 %   Lymphs Abs 2.3 0.7 - 4.0 K/uL   Monocytes Relative 13 %   Monocytes Absolute 1.3 (H) 0.1 - 1.0 K/uL   Eosinophils Relative 3 %   Eosinophils Absolute 0.3 0.0 - 0.5 K/uL   Basophils Relative 0 %   Basophils Absolute 0.0 0.0 - 0.1 K/uL   Immature Granulocytes 0 %   Abs Immature Granulocytes 0.03 0.00 - 0.07 K/uL    Comment: Performed at [facility], [address] [place], [place] [address]  Comprehensive metabolic panel     Status: Abnormal   Collection Time: 10/24/18  5:15 AM  Result Value Ref Range   Sodium 142 135 - 145 mmol/L   Potassium 4.2 3.5 - 5.1 mmol/L   Chloride 107 98 - 111 mmol/L   CO2 22 22 - 32 mmol/L   Glucose, Bld 99 70 - 99 mg/dL   BUN 29 (H) 6 - 20 mg/dL   Creatinine, Ser 1.87 (H) 0.61 - 1.24 mg/dL   Calcium 9.0 8.9 - 10.3 mg/dL   Total Protein 7.0 6.5 - 8.1 g/dL   Albumin 2.7 (L) 3.5 - 5.0 g/dL   AST 25 15 - 41 U/L   ALT 19 0 - 44 U/L   Alkaline Phosphatase 55 38 - 126 U/L   Total Bilirubin 0.9 0.3 - 1.2 mg/dL   GFR calc non Af Amer 39 (L) >60 mL/min   GFR calc Af Amer 45 (L) >60 mL/min   Anion gap 13 5 - 15    Comment: Performed at [facility] [address], [place], [place] [address]  Glucose, capillary     Status: Abnormal   Collection Time: 10/24/18  6:02 AM  Result Value Ref Range   Glucose-Capillary 104 (H) 70 -  99 mg/dL  Glucose, capillary     Status: Abnormal   Collection Time: 10/24/18 11:26 AM  Result Value Ref Range  Glucose-Capillary 170 (H) 70 - 99 mg/dL  Glucose, capillary     Status: Abnormal   Collection Time: 10/24/18  5:01 PM  Result Value Ref Range   Glucose-Capillary 142 (H) 70 - 99 mg/dL  Glucose, capillary     Status: Abnormal   Collection Time: 10/24/18  9:10 PM  Result Value Ref Range   Glucose-Capillary 124 (H) 70 - 99 mg/dL  Glucose, capillary     Status: None   Collection Time: 10/25/18  6:17 AM  Result Value Ref Range   Glucose-Capillary 80 70 - 99 mg/dL    Dg Knee 1-2 Views Left  Result Date: 10/24/2018 CLINICAL DATA:  Persistent pain in the left knee. EXAM: LEFT KNEE - 1-2 VIEW COMPARISON:  02/16/2011 FINDINGS: Two-view exam shows no evidence of fracture or dislocation. Joint effusion noted in the suprapatellar bursa. No worrisome lytic or sclerotic osseous abnormality. IMPRESSION: Joint effusion without acute bony findings. Electronically Signed   By: Misty Stanley M.D.   On: 10/24/2018 12:31    Review of Systems  Constitutional: Negative for chills, fever and weight loss.  HENT: Negative for ear discharge, ear pain, hearing loss and tinnitus.   Eyes: Negative for blurred vision, double vision, photophobia and pain.  Respiratory: Negative for cough, sputum production and shortness of breath.   Cardiovascular: Negative for chest pain.  Gastrointestinal: Negative for abdominal pain, nausea and vomiting.  Genitourinary: Negative for dysuria, flank pain, frequency and urgency.  Musculoskeletal: Positive for joint pain (Left knee). Negative for back pain, falls, myalgias and neck pain.  Neurological: Negative for dizziness, tingling, sensory change, focal weakness, loss of consciousness and headaches.  Endo/Heme/Allergies: Does not bruise/bleed easily.  Psychiatric/Behavioral: Negative for depression, memory loss and substance abuse. The patient is not nervous/anxious.     Blood pressure (!) 146/78, pulse 63, temperature 97.9 F (36.6 C), temperature source Oral, resp. rate 17, height 5\' 10"  (1.778 m), weight (P) 133.7 kg, SpO2 97 %. Physical Exam  Constitutional: He appears well-developed and well-nourished. No distress.  HENT:  Head: Normocephalic and atraumatic.  Eyes: Conjunctivae are normal. Right eye exhibits no discharge. Left eye exhibits no discharge. No scleral icterus.  Neck: Normal range of motion.  Cardiovascular: Normal rate and regular rhythm.  Respiratory: Effort normal. No respiratory distress.  Musculoskeletal:     Comments: LLE No traumatic wounds, ecchymosis, or rash  Mild knee effusion, mod pain with AROM, no pain with PROM of knee  No ankle effusion  Knee stable to varus/ valgus and anterior/posterior stress  Sens DPN, SPN, TN intact  Motor EHL, ext, flex, evers 5/5  DP 1+, PT 1+, Unna boot in place  Neurological: He is alert.  Skin: Skin is warm and dry. He is not diaphoretic.  Psychiatric: He has a normal mood and affect. His behavior is normal.    Assessment/Plan: Left knee pain/effusion -- Will aspirate and inject. Fluid for GS, C&S, and cell count.    Lisette Abu, PA-C Orthopedic Surgery [phone] 10/25/2018, 1:09 PM

## 2018-10-25 NOTE — Patient Care Conference (Addendum)
Inpatient RehabilitationTeam Conference and Plan of Care Update Date: 10/25/2018   Time: 10:45 AM    Patient Name: [name]      Medical Record Number: [id number]  Date of Birth: [birth year] Sex: Male         Room/Bed: 4W23C/4W23C-01 Payor Info: Payor: [employer] MEDICARE / Plan: [employer] MEDICARE / Product Type: *No Product type* /    Admitting Diagnosis: CVA 1 Team  CVA no paresis   Admit Date/Time:  10/23/2018  1:56 PM Admission Comments: No comment available   Primary Diagnosis:  <principal problem not specified> Principal Problem: <principal problem not specified>  Patient Active Problem List   Diagnosis Date Noted  . 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 CVA (cerebral vascular accident) ([place]) 10/20/2018  . UTI (urinary tract infection) 10/19/2018  . Complete heart block ([place]) 08/01/2018  . Upper airway cough syndrome 08/01/2018  . Lymphedema 10/06/2016  . Gout, arthritis   . 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 Onychomycosis of toenail 09/26/2014  . Diabetes mellitus type 2 in obese ([place])   . Iron deficiency anemia 07/29/2014  . CKD (chronic kidney disease), stage III ([place])   . Chronic diastolic HF (heart failure) ([place]) 03/27/2013  . Peripheral edema 07/20/2012  . Obstructive sleep apnea, suspected 06/21/2012  . Pacemaker-[employer] 02/23/2012  . Hypertension 08/09/2011  . COPD with asthma ([place]) 08/09/2011  . Mobitz type II atrioventricular block 05/19/2011  . 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 Alysia Penna Social Worker Present: Ovidio Kin, LCSW Nurse Present: Benjie Karvonen, RN PT Present: Leavy Cella, PT OT Present: Darleen Crocker, OT SLP Present: Charolett Bumpers, SLP PPS Coordinator present : Gunnar Fusi     Current Status/Progress Goal Weekly Team Focus  Medical   Bladder urgency , Left knee effusion, coughing with meals, min/mod A  ADL  reduce Left knee pain and improve effusion, diplopia  ortho consult   Bowel/Bladder   continent of bowel and bladder, urgency with urination         Swallow/Nutrition/ Hydration             ADL's   min - mod A overall with use of RW  mod I overall  ADL retraining, balance, functional mobility/transfers, strength, and endurance   Mobility   supine to sit supervision, min/mod assist stand pivot, amb 45 ft with rw + min assist.   mod I overall  gait, mobility, safety, education, balance   Communication             Safety/Cognition/ Behavioral Observations            Pain   c/o left knee pain, medicated with tylenol for pain 3 times in 24 hours         Skin   dry, intact            *See Care Plan and progress notes for long and short-term goals.     Barriers to Discharge  Current Status/Progress Possible Resolutions Date Resolved   Physician    Medical stability;Decreased caregiver support     hampered by knee pain,   ortho consult, compensate for diplopia      Nursing  Decreased caregiver support               PT  Decreased caregiver support;Inaccessible home environment;Lack of/limited family support  OT Decreased caregiver support                SLP                SW Decreased caregiver support;Lack of/limited family support Does not have 24 hr care             Discharge Planning/Teaching Needs:  Home alone with intermittent checks from family friend. Needs to be mod/i to be able to go home.      Team Discussion:  Goals mod/i level-evaluated yesterday. Needing SP eval and ordered via MD for today. Currently min-mod level with rolling walker. Left knee pain issue and limitation. Urgency with urine not new had at home. Bimonthly infusion held til 6/12. Double vision better with glasses taped.   Revisions to Treatment Plan:  DC 5/28    Continued Need for Acute Rehabilitation Level of Care: The patient requires daily medical management by a  physician with specialized training in physical medicine and rehabilitation for the following conditions: Daily direction of a multidisciplinary physical rehabilitation program to ensure safe treatment while eliciting the highest outcome that is of practical value to the patient.: Yes Daily medical management of patient stability for increased activity during participation in an intensive rehabilitation regime.: Yes Daily analysis of laboratory values and/or radiology reports with any subsequent need for medication adjustment of medical intervention for : Neurological problems;Other   I attest that I was present, lead the team conference, and concur with the assessment and plan of the team. Teleconference held due to [place], Gardiner Rhyme 10/25/2018, 3:53 PM

## 2018-10-25 NOTE — Progress Notes (Signed)
Physical Therapy Session Note  Patient Details  Name: [name] MRN: [id number] Date of Birth: [birth year]  Today's Date: 10/25/2018 PT Individual Time: 0803-0900 and 1300-1345  PT Individual Time Calculation (min): 57 min and 45 min  Short Term Goals: Week 1:  PT Short Term Goal 1 (Week 1): Pt will ambulate at least 44f using RW with no more than CGA PT Short Term Goal 2 (Week 1): Pt will perform bed<>chair transfers with no more than CGA PT Short Term Goal 3 (Week 1): Pt will ascend/descend 4 steps using bilateral handrails with no more than CGA PT Short Term Goal 4 (Week 1): Pt will performed bed mobility independently without using bed features  Skilled Therapeutic Interventions/Progress Updates: Pt presented in bed agreeable to therapy. Pt expressed frustration due to increased coughing episode while eating breakfast this am with sensation of food being "stuck in throat". Pt also expressed frustration with increased pain in knee, pt ok with requesting pain meds, advised nsg. With increased time pt performed supine to sit at EOB with CGA and use of bed features. Pt then transported to w/c with CGA via stand pivot. Pt requesting to brush teeth and was performed with set up. Pt then noted need for use of bathroom. Pt performed STS with RW and ambulated approx 123fto toilet. Pt then performed urinary void in standing demonstrating no lateral leans. Pt was able to safety take steps backwards away from toilet CGA and return to w/c in same manner. Once completed hand hygiene in sitting pt propelled approx 12021fn hallway for general conditioning performed at supervision level. Pt transported remaining distance to rehab gym and participated in gentle seated and standing BLE therex: hip flexion seated 2 x5, LAQ RLE x 15, standing hip abd/add and standing SLR 2 x 5 bilaterally. Pt required seated breaks between standing activities due to increased pain in L knee. Pt then ambulated approx 55 ft with  RW and CGA with w/c follow for safety. Pt transported back to room remaining distance and transferred to bed via ambulatory transfer CGA. Pt returned to bed and left with bed alarm on, call bell within reach and needs met.   Tx2: Pt presented in bed agreeable to therapy. Pt stating continues to have pain in L knee but expecting ortho MD for edema aspiration and steroid injection. Pt transported part way to rehab gym and pt ambulated remaining 48f32f rehab gym with CGA. Pt participated in balance activities WLP including horseshoe toss using BUE reaching across midline without AD. Pt then placed horseshoes onto basketball rim, pt noted to have x 1 LOB with L anteriorlateral lean requiring minA from PTA. Pt then performed same activity while placing LLE on 4in step with no LOB. Pt required HHA to place LLE onto step. Pt also required frequent seated rest breaks due to increased pain. Pt then performed ambulatory transfer to w/c and transported to day room. Pt participated in NUStep L3 x 5 min for global conditioning with no increase in pain. Pt returned to w/c in same manner as prior. Pt then transported to room hallway and ambulated approx 50ft29froom. Pt returned to bed and returned to supine supervision level. Pt left in bed with bed alarm on, call bell within reach and MD arriving to perform procedure.      Therapy Documentation Precautions:  Precautions Precautions: Fall, Other (comment) Precaution Comments: left lateral LOB; vital sign precautions: maintain HR between 55-120, SBP 100-180, and DBP 60-100 Restrictions Weight  Bearing Restrictions: No General:   Vital Signs: Therapy Vitals Temp: 98.9 F (37.2 C) Temp Source: Oral Pulse Rate: 60 Resp: 14 BP: 140/86 Patient Position (if appropriate): Lying Oxygen Therapy SpO2: 97 % O2 Device: Room Air   Therapy/Group: Individual Therapy  Windy Dudek  Aubery Date, PTA  10/25/2018, 4:48 PM

## 2018-10-25 NOTE — Progress Notes (Signed)
Social Work [name], male   DOB: 06/08/1960, 58 y.o.   MRN: [id number] Met with pt to discuss team conference goals mod/i level and target discharge 5/28. He is having more trouble with his knee than his stroke. His double vision is better with his glasses taped. He is concerned about the refrig to be delivered 5/23 from [place]. Gave pt the [employer] number and he is to follow up with. Aware of speech consult to evaluate swallow today with his coughing. Work on discharge needs. Still looking for a charger for his phone.

## 2018-10-25 NOTE — Procedures (Signed)
Procedure: Left knee aspiration and injection  Indication: Left knee effusion(s)  Surgeon: Silvestre Gunner, PA-C  Assist: None  Anesthesia: None  EBL: None  Complications: None  Findings: After risks/benefits explained patient desires to undergo procedure. Consent obtained and time out performed. The left knee was sterilely prepped and aspirated. 52ml clear yellow fluid obtained. 79ml 0.5% Marcaine and 80mg  depo-medrol instilled. Pt tolerated the procedure well.    Lisette Abu, PA-C Orthopedic Surgery [phone]

## 2018-10-26 ENCOUNTER — Inpatient Hospital Stay (HOSPITAL_COMMUNITY): Payer: Medicare Other | Admitting: Speech Pathology

## 2018-10-26 ENCOUNTER — Inpatient Hospital Stay (HOSPITAL_COMMUNITY): Payer: Medicare Other | Admitting: Occupational Therapy

## 2018-10-26 ENCOUNTER — Encounter: Payer: Medicare Other | Admitting: *Deleted

## 2018-10-26 ENCOUNTER — Inpatient Hospital Stay (HOSPITAL_COMMUNITY): Payer: Medicare Other | Admitting: Physical Therapy

## 2018-10-26 LAB — GLUCOSE, CAPILLARY
Glucose-Capillary: 165 mg/dL — ABNORMAL HIGH (ref 70–99)
Glucose-Capillary: 235 mg/dL — ABNORMAL HIGH (ref 70–99)
Glucose-Capillary: 232 mg/dL — ABNORMAL HIGH (ref 70–99)
Glucose-Capillary: 236 mg/dL — ABNORMAL HIGH (ref 70–99)

## 2018-10-26 NOTE — Progress Notes (Signed)
Occupational Therapy Session Note  Patient Details  Name: [name] MRN: [id number] Date of Birth: February 14, 1961  Today's Date: 10/26/2018 OT Individual Time: 7425-9563 OT Individual Time Calculation (min): 44 min    Short Term Goals: Week 1:  OT Short Term Goal 1 (Week 1): STGs=LTGs secondary to estimated short LOS  Skilled Therapeutic Interventions/Progress Updates:    Upon entering the room, pt seated on toileting with RN present. Pt with no c/o pain and agreeable to OT intervention. Pt standing from toilet with min A and use of RW. Pt needing assistance with hygiene for thoroughness. Pt transferred onto TTB with min A and use of RW. OT covered B LEs and R forearm that currently has IV. Pt bathing self while seated on TTB with min A overall for safety. Pt drying self and ambulating back to bed with min A and donning hospital gown. Total A to don B no slip socks. Sit >supine with supervision overall. Pt in bed with call bell and all needed items.   Therapy Documentation Precautions:  Precautions Precautions: Fall, Other (comment) Precaution Comments: left lateral LOB; vital sign precautions: maintain HR between 55-120, SBP 100-180, and DBP 60-100 Restrictions Weight Bearing Restrictions: No General:   Vital Signs: Therapy Vitals Pulse Rate: 80 BP: (!) 140/94 Pain: Pain Assessment Pain Scale: 0-10 Pain Score: 0-No pain   Therapy/Group: Individual Therapy  Gypsy Decant 10/26/2018, 11:55 AM

## 2018-10-26 NOTE — Progress Notes (Signed)
[place] PHYSICAL MEDICINE & REHABILITATION PROGRESS NOTE   Subjective/Complaints:  Left knee pain has improved after arthrocentesis, reviewed prelim synovial fluid cell count and gm stain, appreciate ortho assistance  Review of systems occasional cough, no chest pain or shortness of breath,  Objective:   No results found. Recent Labs    10/24/18 0515  WBC 10.0  HGB 14.3  HCT 43.1  PLT 175   Recent Labs    10/24/18 0515  NA 142  K 4.2  CL 107  CO2 22  GLUCOSE 99  BUN 29*  CREATININE 1.87*  CALCIUM 9.0    Intake/Output Summary (Last 24 hours) at 10/26/2018 1229 Last data filed at 10/26/2018 4098 Gross per 24 hour  Intake 700 ml  Output 1675 ml  Net -975 ml     Physical Exam: Vital Signs Blood pressure (!) 140/94, pulse 80, temperature 99.1 F (37.3 C), temperature source Oral, resp. rate 18, height 5\' 10"  (1.778 m), weight (P) 133.7 kg, SpO2 98 %.   General: No acute distress Mood and affect are appropriate Heart: Regular rate and rhythm no rubs murmurs or extra sounds Lungs: Clear to auscultation, breathing unlabored, no rales or wheezes Abdomen: Positive bowel sounds, soft nontender to palpation, nondistended Extremities: No clubbing, cyanosis, or edema Skin: No evidence of breakdown, no evidence of rash Neurologic: Cranial nerves II through XII intact, motor strength is4/5 RUE and  5/5 in LUEdeltoid, bicep, tricep, grip, 5/5 RLE, 4/5 LLE hip flexor, knee extensors, ankle dorsiflexor and plantar flexor  Cerebellar exam normal finger to nose to finger in left upper, mild dysmetria right upper musculoskeletal: Reduced left knee flexion and extension, suprapatellar bursa effusion noted, minimal tenderness, no erythema Assessment/Plan: 1. 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  Physiatrist is providing close team  supervision and 24 hour management of active medical problems listed below.  Physiatrist and rehab team continue to assess barriers to discharge/monitor patient progress toward functional and medical goals  Care Tool:  Bathing  Bathing activity did not occur: Refused Body parts bathed by patient: Right arm, Left arm, Chest, Abdomen, Front perineal area, Right upper leg, Left upper leg, Face   Body parts bathed by helper: Buttocks Body parts n/a: Right lower leg, Left lower leg   Bathing assist Assist Level: Minimal Assistance - Patient > 75%     Upper Body Dressing/Undressing Upper body dressing Upper body dressing/undressing activity did not occur (including orthotics): Refused What is the patient wearing?: Hospital gown only    Upper body assist Assist Level: Set up assist    Lower Body Dressing/Undressing Lower body dressing    Lower body dressing activity did not occur: Refused What is the patient wearing?: Hospital gown only     Lower body assist       Toileting Toileting    Toileting assist Assist for toileting: Minimal Assistance - Patient > 75%     Transfers Chair/bed transfer  Transfers assist  Chair/bed transfer activity did not occur: N/A  Chair/bed transfer assist level: Contact Guard/Touching assist     Locomotion Ambulation   Ambulation assist   Ambulation activity did not occur: Refused  Assist level: Maximal Assistance - Patient 25 - 49%(due to L LOB) Assistive device: Walker-rolling Max distance: 48ft   Walk 10 feet activity   Assist     Assist level: Maximal Assistance - Patient 25 - 49% Assistive device:  Walker-rolling   Walk 50 feet activity   Assist Walk 50 feet with 2 turns activity did not occur: Safety/medical concerns         Walk 150 feet activity   Assist Walk 150 feet activity did not occur: Safety/medical concerns         Walk 10 feet on uneven surface  activity   Assist Walk 10 feet on uneven  surfaces activity did not occur: (up/down ramp)   Assist level: Moderate Assistance - Patient - 50 - 74% Assistive device: Aeronautical engineer Will patient use wheelchair at discharge?: No             Wheelchair 50 feet with 2 turns activity    Assist            Wheelchair 150 feet activity     Assist            Medical Problem List and Plan: 1.Functional and mobility deficitssecondary to presumptive brainstem infarct, RUE weakness and ataxia,diplopia, likely left pontine infarct -CIR PT OT evaluation, team conference in a.m. 2. Antithrombotics: -DVT/anticoagulation:Pharmaceutical:Lovenox -antiplatelet therapy: ASA/Plavix X 3 weeks followed by Plavix alone. 3. Pain Management:tylenol prn. Knee pain will check x-rays, does not appear to be gout given lack of erythema and significant tenderness 4. Mood:LCSW to follow for evaluation and support. -antipsychotic agents: N/A 5. Neuropsych: This patientiscapable of making decisions on hisown behalf. 6. Skin/Wound Care:Routine pressure relief measures. Continue unna boots for edema control--change weekly. These need to be re-applied to provide more compression. Contact WOC RN change weekly 7. Fluids/Electrolytes/Nutrition:Monitor I/O. Check lytes in am. 8. T2DM:Will monitor BS achs. Continue 70/30 insulin bid and titrate as indicated.  CBG (last 3)  Recent Labs    10/25/18 2103 10/26/18 0640 10/26/18 1203  GLUCAP 150* 165* 235*  Controlled 5/21 9.Fabry's disease/ acute on CKD:Monitor with serial checks. Follow up labs---infusion ordered for 5/29 per Dr. Marval Regal. 10.Diastolic HF/DOE: Monitor weights daily. Albuterol prnSOB.Unna boots 11.CHB s/p PPM: Continue to monitor HR bid. Will monitor weights daily as lasix on hold. 12. HTN: Monitor BP bid. On Norvasc daily. Lasix on hold at this time. Vitals:   10/26/18 0813  10/26/18 0814  BP: (!) 140/94 (!) 140/94  Pulse: 80   Resp:    Temp:    SpO2:    Controlled continue current medications 13. Dyslipidemia:On Lipitor 80 mg daily. 14.  Question COPD using inhalers at home non-smoker Morbid obesity- BMI 42 diet , exercise, some of this weight is fluid related as well 15.  Left knee effusion , symptomatically improved after arthrocentesis, no fever, CBC without neutrophilia, await ortho f/u LOS: 3 days A FACE TO FACE EVALUATION WAS PERFORMED  Charlett Blake 10/26/2018, 12:29 PM

## 2018-10-26 NOTE — Evaluation (Signed)
Speech Language Pathology Assessment and Plan  Patient Details  Name: [name] MRN: [id number] Date of Birth: [birth year]  SLP Diagnosis: Dysphagia  Rehab Potential: Good ELOS: 1 week    Today's Date: 10/26/2018 SLP Individual Time: 0800-0900 SLP Individual Time Calculation (min): 60 min   Problem List:  Patient Active Problem List   Diagnosis Date Noted  . 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 CVA (cerebral vascular accident) ([place]) 10/20/2018  . UTI (urinary tract infection) 10/19/2018  . Complete heart block ([place]) 08/01/2018  . Upper airway cough syndrome 08/01/2018  . Lymphedema 10/06/2016  . Gout, arthritis   . 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 Onychomycosis of toenail 09/26/2014  . Diabetes mellitus type 2 in obese ([place])   . Iron deficiency anemia 07/29/2014  . CKD (chronic kidney disease), stage III ([place])   . Chronic diastolic HF (heart failure) ([place]) 03/27/2013  . Peripheral edema 07/20/2012  . Obstructive sleep apnea, suspected 06/21/2012  . Pacemaker-St.Jude 02/23/2012  . Hypertension 08/09/2011  . COPD with asthma ([place]) 08/09/2011  . Mobitz type II atrioventricular block 05/19/2011  . 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[place]) 05/19/2011   Past Medical History:  Past Medical History:  Diagnosis Date  . Bell's palsy   . Bifascicular block   . Chronic bronchitis ([place])    "seasonal; get it q yr"  . Chronic diastolic CHF (congestive heart failure) ([place]) 2010  . Chronic kidney disease (CKD), stage II (mild)    stage II to III/notes 07/23/2014  . Diastolic heart failure secondary to hypertrophic cardiomyopathy ([place]) CARDIOLOGIST-- DR Daneen Schick  . Dyspnea    increased exertion   . Edema 07/2013  . Fabry's disease ([place]) RENAL AND CARDIAC INVOVLEMENT   FOLLOWED DR [place]  . Gout, arthritis 2014   bil feet. right worse  . History of cellulitis of skin with lymphangitis LEFT LEG  . HOCM (hypertrophic obstructive cardiomyopathy) ([place])    a. Echo 11/16: Severe LVH, EF  55-60%, abnormal GLS consistent with HOCM, no SAM, mild LAE  . Hypertension 20 years  . Lymphedema of lower extremity LEFT  >  RIGHT   "using ankle high socks at home; pump doesn't work for me" (07/23/2014)  . Pacemaker    02/12/11  . Pneumonia 08/2011  . Seasonal asthma NO INHALERS   30 years  . Second degree Mobitz II AV block    with syncope, s/p PPM  . Short of breath on exertion   . Type II diabetes mellitus ([place]) 2012   INSULIN DEPENDENT   Past Surgical History:  Past Surgical History:  Procedure Laterality Date  . CARDIAC CATHETERIZATION  03-12-2011  DR Daneen Schick   HYPERTROPHIC CARDIOMYOPATHY WITH LV CAVITY  APPEARANCE CONSISTANT WITH SIGNIFICANT APICAL HYPERTROPHY/ NORMAL LVSF / EF 55%/ EVIDENCE OF DIASTOLIC DYSFUNCTION WITH EDP OF 23-37mHg AFTER A-WAVE/ NORMAL CORONARY ARTERIES  . EVALUATION UNDER ANESTHESIA WITH FISTULECTOMY N/A 06/23/2015   Procedure: EXAM UNDER ANESTHESIA WITH POSSIBLE FISTULOTOMY;  Surgeon: JJudeth Horn MD;  [facility]  Service: General;  Laterality: N/A;  . HERNIA REPAIR  [id number]  Umbilical Hernia Repair  . INCISION AND DRAINAGE PERIRECTAL ABSCESS Left 07/29/2014   Procedure: IRRIGATION AND DEBRIDEMENT PERIRECTAL ABSCESS;  Surgeon: JDoreen Salvage MD;  [facility]  Service: General;  Laterality: Left;  Prone position  . INSERT / REPLACE / REMOVE PACEMAKER  02/12/2011   SJM implanted by Dr ARayann Hemanfor Mobitz II second degree AV block and syncope  . IRRIGATION AND DEBRIDEMENT ABSCESS N/A 07/27/2013  Procedure: IRRIGATION AND DEBRIDEMENT OF SKIN, SOFT TISSUE AND MUSCLES OF UPPER BACK (11X19X4cm) WITH 10 BLADE AND PULSATILE LAVAGE ;  Surgeon: Gayland Curry, MD;  Location: [facility];  Service: General;  Laterality: N/A;  . UMBILICAL HERNIA REPAIR  03/22/2012   Procedure: HERNIA REPAIR UMBILICAL ADULT;  Surgeon: Leighton Ruff, MD;  Location: [facility];  Service: General;  Laterality: N/A;  Umbilical Hernia Repair     Assessment / Plan / Recommendation Clinical  Impression [name] is a 58 year old male with  History of 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bimonthly Fabrazyme infusions), COPD with asthma, T2DM, HTN, CHB s/p PPM; who was admitted on 10/19/2018 with difficulty walking, headache and left facial paresthesias.  Work-up revealed staph UTI and CT head done for work-up and was negative.  Neurology consulted for input and exam revealed ataxia with nystagmus and diplopia felt to be likely due to age indeterment pontinue stroke. Urine cultures positive for staph and patient with frequency--treated with ceftriaxone. Fabrazyme treatment to be held in setting of acute illness and to resume schedule in 2 weeks per input from nephrology.  Therapy ongoing and patient continues to be limited by ataxia with diplopia and balance deficits affecting mobility and ADLs.  CIR recommended due to functional decline.   Pt admited on 10/23/18 with BSE ordered on 10/25/18 d/t report of difficulty masticating regular diet. Pt downgraded to dysphagia 2 by nursing. BSE completed on 10/26/18. Pt presents with mild left facial weakness and history of decreased ability to taste food. Pt currently reports that he is regaining his ability to taste food as well as comfort when swallowing food. During the evaluation, pt consumed graham crackers, diced peaches and thin liquids via cup and straws. Pt with appropriate oral phase as evidenced by timely oral phase and no residue. Pt's swallow initiation appeared swift. Overall pt displays mild intermittent throat clears throughout consumption that isn't directly correlated to thin liquids or food textures. Chart review reveals that pt passed [place] and that he has been consuming regular diet with thin liquids without any respiratory issues. Given this and pt's report of improved taste and "feeling" in his mouth, recommend defer any instrumental swallow studies and ST services to monitor and assess for possibility of further diet upgrade. Pt is  agreeable to this plan and he voices understanding of concepts introduced.    Skilled Therapeutic Interventions          Skilled treatment session focused on completion of bedside swallow evaluation, see above. Session also included education regarding location of stroke, implications on swallow function, respiratory function and current diet. All questions answered to pt's satisfaction.    SLP Assessment  Patient will need skilled Speech Lanaguage Pathology Services during CIR admission    Recommendations  SLP Diet Recommendations: Dysphagia 2 (Fine chop);Thin Liquid Administration via: Cup;Straw Medication Administration: Whole meds with liquid Supervision: Patient able to self feed Compensations: Minimize environmental distractions;Slow rate;Small sips/bites Postural Changes and/or Swallow Maneuvers: Seated upright 90 degrees Patient destination: Home Follow up Recommendations: None Equipment Recommended: None recommended by SLP    SLP Frequency 3 to 5 out of 7 days   SLP Duration  SLP Intensity  SLP Treatment/Interventions 1 week  Minumum of 1-2 x/day, 30 to 90 minutes  Dysphagia/aspiration precaution training;Patient/family education    Pain Pain Assessment Pain Scale: 0-10 Pain Score: 0-No pain  Prior Functioning Cognitive/Linguistic Baseline: Within functional limits Type of Home: House  Lives With: Alone Available Help at Discharge: Friend(s);Available PRN/intermittently  Vocation: Retired  Industrial/product designer Term Goals: Week 1: SLP Short Term Goal 1 (Week 1): Pt will consume current diet with minimal overt s/s of aspiration or dysphagia.  SLP Short Term Goal 2 (Week 1): Pt will consume trials of dysphagia 3 with no increase of overt s/s of aspiration or dysphagia.  Refer to Care Plan for Long Term Goals  Recommendations for other services: None   Discharge Criteria: Patient will be discharged from SLP if patient refuses treatment 3 consecutive times without medical reason,  if treatment goals not met, if there is a change in medical status, if patient makes no progress towards goals or if patient is discharged from hospital.  The above assessment, treatment plan, treatment alternatives and goals were discussed and mutually agreed upon: by patient  Marvine Encalade 10/26/2018, 8:48 AM

## 2018-10-26 NOTE — Progress Notes (Signed)
Orthopedic Tech Progress Note Patient Details:  [name] Jan 03, 1961 [id number]  Ortho Devices Type of Ortho Device: Louretta Parma boot Ortho Device/Splint Location: Bilateral unna boots Ortho Device/Splint Interventions: Application   Post Interventions Patient Tolerated: Well Instructions Provided: Care of device   [place] Pink 10/26/2018, 5:07 PM

## 2018-10-26 NOTE — IPOC Note (Signed)
Overall Plan of Care [facility]) Patient Details Name: [name] MRN: [id number] DOB: Jun 20, 1960  Admitting Diagnosis: <principal problem not specified>  Hospital Problems: Active Problems:   Brain stem stroke syndrome     Functional Problem List: Nursing Bladder, Behavior, Bowel, Medication Management, Edema, Endurance, Pain, Safety  PT Balance, Behavior, Pain, Edema, Endurance, Motor  OT Balance, Behavior, Endurance, Pain, Safety  SLP    TR         Basic ADL's: OT Grooming, Bathing, Dressing, Toileting     Advanced  ADL's: OT Simple Meal Preparation, Laundry     Transfers: PT Bed Mobility, Bed to Chair, Car, Manufacturing systems engineer, Metallurgist: PT Ambulation, Stairs     Additional Impairments: OT None  SLP Swallowing      TR      Anticipated Outcomes Item Anticipated Outcome  Self Feeding n/a  Swallowing  Mod I   Basic self-care  mod I   Toileting  mod I   Bathroom Transfers mod I   Bowel/Bladder  maintain regular pattern of emptying bowel and bladder. Eliminate incontinence  Transfers  mod-I transfers  Locomotion  mod-I ambulating with LRAD  Communication     Cognition     Pain  less  than 3  Safety/Judgment  Remain free of falls, infection, skin breakdown   Therapy Plan: PT Intensity: Minimum of 1-2 x/day ,45 to 90 minutes PT Frequency: 5 out of 7 days PT Duration Estimated Length of Stay: 9-11days OT Intensity: Minimum of 1-2 x/day, 45 to 90 minutes OT Frequency: 5 out of 7 days OT Duration/Estimated Length of Stay: 7-10 days SLP Intensity: Minumum of 1-2 x/day, 30 to 90 minutes SLP Frequency: 3 to 5 out of 7 days SLP Duration/Estimated Length of Stay: 1 week   Due to the current state of emergency, patients may not be receiving their 3-hours of Medicare-mandated therapy.   Team Interventions: Nursing Interventions Patient/Family Education, Bladder Management, Bowel Management, Pain Management, Dysphagia/Aspiration  Precaution Training, Discharge Planning  PT interventions Ambulation/gait training, Cognitive remediation/compensation, Discharge planning, DME/adaptive equipment instruction, Functional mobility training, Pain management, Psychosocial support, Splinting/orthotics, Therapeutic Activities, UE/LE Strength taining/ROM, Visual/perceptual remediation/compensation, Training and development officer, Community reintegration, Disease management/prevention, Functional electrical stimulation, Neuromuscular re-education, Patient/family education, Skin care/wound management, Stair training, Therapeutic Exercise, UE/LE Coordination activities  OT Interventions Balance/vestibular training, Neuromuscular re-education, Self Care/advanced ADL retraining, Therapeutic Exercise, DME/adaptive equipment instruction, Pain management, UE/LE Strength taining/ROM, Community reintegration, Barrister's clerk education, UE/LE Coordination activities, Discharge planning, Functional mobility training, Psychosocial support, Therapeutic Activities  SLP Interventions Dysphagia/aspiration precaution training, Patient/family education  TR Interventions    SW/CM Interventions Discharge Planning, Psychosocial Support, Patient/Family Education   Barriers to Discharge MD  Medical stability and Incontinence  Nursing Decreased caregiver support    PT Decreased caregiver support, Inaccessible home environment, Lack of/limited family support    OT Decreased caregiver support    SLP      SW Decreased caregiver support, Lack of/limited family support Does not have 24 hr care    Team Discharge Planning: Destination: PT-Home ,OT- Home , SLP-Home Projected Follow-up: PT-Home health PT, OT-  Home health OT, SLP-None Projected Equipment Needs: PT-To be determined, Other (comment), OT- Tub/shower bench, 3 in 1 bedside comode, SLP-None recommended by SLP Equipment Details: PT-has RW and cane, OT-  Patient/family involved in discharge planning: PT-  Patient,  OT-Patient, SLP-Patient  MD ELOS: 10-12d Medical Rehab Prognosis:  Good Assessment:  58 year old male with History of 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bimonthly  Fabrazyme infusions), COPD with asthma, T2DM, HTN, CHB s/p PPM; who was admitted on 10/19/2018 with difficulty walking,headache and left facial paresthesias.Work-up revealed staph UTI and CT head done for work-up and was negative. Neurology consulted for input and exam revealed ataxia with nystagmus and diplopia felt to be likely due to small brainstem infarct. 2D echo showed EF of 55 to 60% with moderate LVH and negative bubble study for PFO.Carotid Dopplers showed no significant ICA stenosis.Dr. Erlinda Hong recommends DAPT X 3 weeks followed by Plavix alone for stroke due to small vessel disease in setting of Fabry[s disease. Urine cultures positive for staph and patient with frequency--treated with ceftriaxone. Fabrazyme treatment to be held in setting of acute illness and to resume schedule in 2 weeks per input from nephrology. Therapy ongoing and patient continues to be limited by ataxia with diplopia and balance deficits affecting mobility and ADLs. CIR recommended due to functional decline   Now requiring 24/7 Rehab RN,MD, as well as CIR level PT, OT and SLP.  Treatment team will focus on ADLs and mobility with goals set at Mod I See Team Conference Notes for weekly updates to the plan of care

## 2018-10-26 NOTE — Progress Notes (Signed)
Discussed tap with [name]. Gram stain without organism and he felt that WBC likely reactive due to inflammatory changes. Await cultures and he will follow up after discussing  results with Dr. Marlou Sa.

## 2018-10-26 NOTE — Progress Notes (Addendum)
Physical Therapy Session Note  Patient Details  Name: [name] MRN: [id number] Date of Birth: Dec 07, 1960  Today's Date: 10/26/2018 PT Individual Time: 1300-1400 PT Individual Time Calculation (min): 60 min   Short Term Goals: Week 1:  PT Short Term Goal 1 (Week 1): Pt will ambulate at least 91ft using RW with no more than CGA PT Short Term Goal 2 (Week 1): Pt will perform bed<>chair transfers with no more than CGA PT Short Term Goal 3 (Week 1): Pt will ascend/descend 4 steps using bilateral handrails with no more than CGA PT Short Term Goal 4 (Week 1): Pt will performed bed mobility independently without using bed features  Skilled Therapeutic Interventions/Progress Updates:  Pt reporting significant improvement in knee pain.  Pt demonstrating improved stability and balance when performing stand pivot transfer bed > w/c without use of AD and min A from therapist.  During gait training with RW on level surfaces x 50' + 25' pt cued to focus gaze on one steady object for stability and balance.  Performed BERG balance to assess falls risk and specific balance impairments.  Pt is at high falls risk and demonstrates increased LOB with narrow BOS, weight shifting and single limb stance.  Rest of session focused on NMR to address orientation to midline and partial SLS with R then L foot on 6" block and slowly decreasing amount of UE support with EO and head still, EC and head still and EO with head turns and head nods.  Also performed wide side stepping in // bars to L and R x 2 reps with decreased UE support and min A.  At end of session pt left in bed with alarm set and all items within reach.  Therapy Documentation Precautions:  Precautions Precautions: Fall, Other (comment) Precaution Comments: left lateral LOB; vital sign precautions: maintain HR between 55-120, SBP 100-180, and DBP 60-100 Restrictions Weight Bearing Restrictions: No Vital Signs: Therapy Vitals Temp: 98.1 F (36.7  C) Temp Source: Oral Pulse Rate: 80 Resp: 18 BP: (!) 141/77 Patient Position (if appropriate): Sitting Oxygen Therapy SpO2: 96 % O2 Device: Room Air Pain: Pain Assessment Pain Scale: 0-10 Pain Score: 0-No pain  Balance: Standardized Balance Assessment Standardized Balance Assessment: Berg Balance Test Berg Balance Test Sit to Stand: Able to stand  independently using hands Standing Unsupported: Able to stand safely 2 minutes Sitting with Back Unsupported but Feet Supported on Floor or Stool: Able to sit safely and securely 2 minutes Stand to Sit: Controls descent by using hands Transfers: Able to transfer safely, minor use of hands Standing Unsupported with Eyes Closed: Able to stand 10 seconds with supervision Standing Ubsupported with Feet Together: Able to place feet together independently and stand for 1 minute with supervision From Standing, Reach Forward with Outstretched Arm: Reaches forward but needs supervision From Standing Position, Pick up Object from Floor: Able to pick up shoe, needs supervision From Standing Position, Turn to Look Behind Over each Shoulder: Needs supervision when turning Turn 360 Degrees: Needs close supervision or verbal cueing Standing Unsupported, Alternately Place Feet on Step/Stool: Needs assistance to keep from falling or unable to try Standing Unsupported, One Foot in Front: Able to take small step independently and hold 30 seconds Standing on One Leg: Unable to try or needs assist to prevent fall Total Score: 32  Patient demonstrates increased fall risk as noted by score of 32/56 on Berg Balance Scale.  (<36= high risk for falls, close to 100%; 37-45 significant >80%; 46-51  moderate >50%; 52-55 lower >25%)    Other Treatments: Treatments Neuromuscular Facilitation: Activity to increase lateral weight shifting;Left;Right;Lower Extremity;Activity to increase sustained activation    Therapy/Group: Individual Therapy  Rico Junker, PT,  DPT 10/26/18    4:01 PM    10/26/2018, 4:01 PM

## 2018-10-27 ENCOUNTER — Telehealth: Payer: Self-pay

## 2018-10-27 ENCOUNTER — Inpatient Hospital Stay (HOSPITAL_COMMUNITY): Payer: Medicare Other | Admitting: Physical Therapy

## 2018-10-27 ENCOUNTER — Inpatient Hospital Stay (HOSPITAL_COMMUNITY): Payer: Medicare Other | Admitting: Occupational Therapy

## 2018-10-27 ENCOUNTER — Inpatient Hospital Stay (HOSPITAL_COMMUNITY): Payer: Medicare Other | Admitting: Speech Pathology

## 2018-10-27 LAB — GLUCOSE, CAPILLARY
Glucose-Capillary: 216 mg/dL — ABNORMAL HIGH (ref 70–99)
Glucose-Capillary: 179 mg/dL — ABNORMAL HIGH (ref 70–99)
Glucose-Capillary: 232 mg/dL — ABNORMAL HIGH (ref 70–99)

## 2018-10-27 MED ORDER — INSULIN ASPART PROT & ASPART (70-30 MIX) 100 UNIT/ML ~~LOC~~ SUSP
18.0000 [IU] | Freq: Two times a day (BID) | SUBCUTANEOUS | Status: DC
  Administered 2018-10-27 – 2018-11-02 (×12): 18 [IU] via SUBCUTANEOUS
  Filled 2018-10-27: qty 10

## 2018-10-27 NOTE — Progress Notes (Signed)
Physical Therapy Session Note  Patient Details  Name: [name] MRN: [id number] Date of Birth: 1960/09/09  Today's Date: 10/27/2018 PT Individual Time: 4580-9983 PT Individual Time Calculation (min): 60 min   Short Term Goals: Week 1:  PT Short Term Goal 1 (Week 1): Pt will ambulate at least 12f using RW with no more than CGA PT Short Term Goal 2 (Week 1): Pt will perform bed<>chair transfers with no more than CGA PT Short Term Goal 3 (Week 1): Pt will ascend/descend 4 steps using bilateral handrails with no more than CGA PT Short Term Goal 4 (Week 1): Pt will performed bed mobility independently without using bed features  Skilled Therapeutic Interventions/Progress Updates:   Pt received supine in bed and agreeable to PT. Supine>sit transfer with supervision assist and min cues for safety cues. Pt reports need for urination. Ambulatory transfer to toilet with CGA and RW. Returned to EOB with RW and CGA from PT. supervision assist to doff dirty gown and don clean one.   Gait training through hall  [id number]+ [id number] CGA-supervision assist from PT for safety. One short standing rest break in each bout of gait training to re-orient to midline and prevent L LOB. Min cues for safety in turns and AD management through doorway. .  Gertie ExonR seated reciprocal marches 3 x 1.5 minutes 30cm/sec. Standing 2 x 1 minutes with intermittient 1-0 UE support at 35cm/sec to improve equal WB through BLE. Standing marches 3 x 1 min with min assist from PT and BUE support on rails. Min cues for use of ankle strategy to prevent LOB and improved pelvic rotation and thoracic posture. Rest break between bout for energy conservation.   Patient returned to room and left sitting in WC [facility] call bell in reach and all needs met.          Therapy Documentation Precautions:  Precautions Precautions: Fall, Other (comment) Precaution Comments: left lateral LOB; vital sign precautions: maintain HR between  55-120, SBP 100-180, and DBP 60-100 Restrictions Weight Bearing Restrictions: No Pain:   denies   Therapy/Group: Individual Therapy  AusLorie Phenix22/2020, 5:30 PM

## 2018-10-27 NOTE — Telephone Encounter (Signed)
Left message for patient to remind of missed remote transmission.  

## 2018-10-27 NOTE — Progress Notes (Signed)
Speech Language Pathology Daily Session Note  Patient Details  Name: TAMARA KENYON MRN: [id number] Date of Birth: 11/05/1960  Today's Date: 10/27/2018 SLP Individual Time: 0800-0830 SLP Individual Time Calculation (min): 30 min  Short Term Goals: Week 1: SLP Short Term Goal 1 (Week 1): Pt will consume current diet with minimal overt s/s of aspiration or dysphagia.  SLP Short Term Goal 2 (Week 1): Pt will consume trials of dysphagia 3 with no increase of overt s/s of aspiration or dysphagia.   Skilled Therapeutic Interventions:  Skilled treatment session focused on dysphagia goals. SLP facilitated session by providing skilled observation of pt consuming dysphagia 2 breakfast tray. Pt with no evidence of aspiration or dysphagia. Plan made for SLP to provide observation of regular tray at lunch. Education provided on appropriate positioning to prevent choking while consuming PO intake.      Pain    Therapy/Group: Individual Therapy  Idy Rawling 10/27/2018, 10:33 AM

## 2018-10-27 NOTE — Progress Notes (Signed)
[facility] PHYSICAL MEDICINE & REHABILITATION PROGRESS NOTE   Subjective/Complaints:  Left knee overall much better had depomedrol plus marcaine injected ~2 d ago  Review of systems occasional cough, no chest pain or shortness of breath,  Objective:   No results found. No results for input(s): WBC, HGB, HCT, PLT in the last 72 hours. No results for input(s): NA, K, CL, CO2, GLUCOSE, BUN, CREATININE, CALCIUM in the last 72 hours.  Intake/Output Summary (Last 24 hours) at 10/27/2018 0839 Last data filed at 10/27/2018 0539 Gross per 24 hour  Intake 720 ml  Output 1450 ml  Net -730 ml     Physical Exam: Vital Signs Blood pressure (!) 143/82, pulse 60, temperature 98.2 F (36.8 C), temperature source Oral, resp. rate 18, height 5\' 10"  (1.778 m), weight 127.4 kg, SpO2 98 %.   General: No acute distress Mood and affect are appropriate Heart: Regular rate and rhythm no rubs murmurs or extra sounds Lungs: Clear to auscultation, breathing unlabored, no rales or wheezes Abdomen: Positive bowel sounds, soft nontender to palpation, nondistended Extremities: No clubbing, cyanosis, or edema Skin: No evidence of breakdown, no evidence of rash Neurologic: Cranial nerves II through XII intact, motor strength is4/5 RUE and  5/5 in LUEdeltoid, bicep, tricep, grip, 5/5 RLE, 4/5 LLE hip flexor, knee extensors, ankle dorsiflexor and plantar flexor  Cerebellar exam normal finger to nose to finger in left upper, mild dysmetria right upper musculoskeletal: Reduced left knee flexion and extension, suprapatellar bursa effusion noted, minimal tenderness, no erythema Assessment/Plan: 1. 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  Physiatrist is providing close team supervision and 24 hour management of active medical problems listed below.  Physiatrist and rehab  team continue to assess barriers to discharge/monitor patient progress toward functional and medical goals  Care Tool:  Bathing  Bathing activity did not occur: Refused Body parts bathed by patient: Right arm, Left arm, Chest, Abdomen, Front perineal area, Right upper leg, Left upper leg, Face   Body parts bathed by helper: Buttocks Body parts n/a: Right lower leg, Left lower leg   Bathing assist Assist Level: Minimal Assistance - Patient > 75%     Upper Body Dressing/Undressing Upper body dressing Upper body dressing/undressing activity did not occur (including orthotics): Refused What is the patient wearing?: Hospital gown only    Upper body assist Assist Level: Set up assist    Lower Body Dressing/Undressing Lower body dressing    Lower body dressing activity did not occur: Refused What is the patient wearing?: Hospital gown only     Lower body assist       Toileting Toileting    Toileting assist Assist for toileting: Minimal Assistance - Patient > 75%     Transfers Chair/bed transfer  Transfers assist  Chair/bed transfer activity did not occur: N/A  Chair/bed transfer assist level: Contact Guard/Touching assist     Locomotion Ambulation   Ambulation assist   Ambulation activity did not occur: Refused  Assist level: Minimal Assistance - Patient > 75% Assistive device: Walker-rolling Max distance: 50'   Walk 10 feet activity   Assist     Assist level: Minimal Assistance - Patient > 75% Assistive device: Walker-rolling   Walk 50 feet activity   Assist Walk 50 feet with 2 turns activity did not occur: Safety/medical concerns  Assist level: Minimal Assistance - Patient > 75% Assistive device: Walker-rolling  Walk 150 feet activity   Assist Walk 150 feet activity did not occur: Safety/medical concerns         Walk 10 feet on uneven surface  activity   Assist Walk 10 feet on uneven surfaces activity did not occur: (up/down  ramp)   Assist level: Moderate Assistance - Patient - 50 - 74% Assistive device: Aeronautical engineer Will patient use wheelchair at discharge?: No             Wheelchair 50 feet with 2 turns activity    Assist            Wheelchair 150 feet activity     Assist            Medical Problem List and Plan: 1.Functional and mobility deficitssecondary to presumptive brainstem infarct, RUE weakness and ataxia,diplopia, likely left pontine infarct -CIR PT OT  2. Antithrombotics: -DVT/anticoagulation:Pharmaceutical:Lovenox -antiplatelet therapy: ASA/Plavix X 3 weeks followed by Plavix alone. 3. Pain Management:tylenol prn. Left knee effusion gm stain neg, elevated WBC in synovial fluid, +urate crystals c/w gout avoid NSAIDs and colchicine due to CKD, expect steroid injection to be effective    4. Mood:LCSW to follow for evaluation and support. -antipsychotic agents: N/A 5. Neuropsych: This patientiscapable of making decisions on hisown behalf. 6. Skin/Wound Care:Routine pressure relief measures. Continue unna boots for edema control--change weekly. These need to be re-applied to provide more compression. Contact WOC RN change weekly 7. Fluids/Electrolytes/Nutrition:Monitor I/O. Check lytes in am. 8. T2DM:Will monitor BS achs. Continue 70/30 insulin bid and titrate as indicated.  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 Follow up labs---infusion ordered for 5/29 per Dr. Marval Regal. 10.Diastolic HF/DOE: Monitor weights daily. Albuterol prnSOB.Unna boots 11.CHB s/p PPM: Continue to monitor HR bid. Will monitor weights daily as lasix on hold. 12. HTN: Monitor BP bid. On Norvasc daily. Lasix on hold at this time. Vitals:   10/26/18  1943 10/27/18 0539  BP: 140/83 (!) 143/82  Pulse: 64 60  Resp: 18 18  Temp: 98.2 F (36.8 C) 98.2 F (36.8 C)  SpO2: 93% 98%  Controlled continue current medications 13. Dyslipidemia:On Lipitor 80 mg daily. 14.  Question COPD using inhalers at home non-smoker Morbid obesity- BMI 42 diet , exercise, some of this weight is fluid related as well 15.  Left knee effusion , symptomatically improved after arthrocentesis, gout  LOS: 4 days A FACE TO Edgar E  10/27/2018, 8:39 AM

## 2018-10-27 NOTE — Progress Notes (Signed)
Speech Language Pathology Daily Session Note  Patient Details  Name: [name] MRN: [id number] Date of Birth: Jun 23, 1960  Today's Date: 10/27/2018 SLP Individual Time: 1200-1230 SLP Individual Time Calculation (min): 30 min  Short Term Goals: Week 1: SLP Short Term Goal 1 (Week 1): Pt will consume current diet with minimal overt s/s of aspiration or dysphagia.  SLP Short Term Goal 2 (Week 1): Pt will consume trials of dysphagia 3 with no increase of overt s/s of aspiration or dysphagia.  Skilled Therapeutic Interventions:  Skilled treatment session focused on dysphagia goals. SLP facilitated session by providing skilled observation of pt consuming regular lunch tray with thin liquids. Pt with appropriate bolus size, rate and oral clearing. Pt with no overt s/s of aspiration. General aspiration precautions reviewed with good recall from pt. Recommend continuing regular diet with brief follow up from [facility]. Pt in agreement. Nursing aware.      Pain Pain Assessment Pain Scale: 0-10 Pain Score: 0-No pain  Therapy/Group: Individual Therapy  [name] 10/27/2018, 12:26 PM

## 2018-10-27 NOTE — Progress Notes (Signed)
Occupational Therapy Session Note  Patient Details  Name: [name] MRN: [id number] Date of Birth: February 28, 1961  Today's Date: 10/27/2018 OT Individual Time: 1046-1130 OT Individual Time Calculation (min): 44 min   Short Term Goals: Week 1:  OT Short Term Goal 1 (Week 1): STGs=LTGs secondary to estimated short LOS  Skilled Therapeutic Interventions/Progress Updates:    Pt greeted in bed with no c/o pain. Declining toileting and grooming task participation. Therefore tx focus was placed on functional transfers, d/c planning, and functional ambulation with device. Supine<sit from flat bed without bedrails completed with supervision. He then ambulated 295 ft with RW to tub shower room with steady assist. Pt with 1 LOB, requiring Mod A to correct. He required vcs for gaze stabilization to help mgt diplopia, slowing pace, and taking standing rest breaks when needed. We discussed bathroom access at home. He states that both bathrooms are walker vs w/c accessible. Pt practiced TTB transfers using RW with steady assist and vcs for technique. Educated pt on bathroom modifications to implement to maximize safety, including adding nonslip surfaces to the shower floor and installing grab bars. Advised pt to complete perihygiene using lateral leans for increased safety as well. He appeared receptive to education, openly collaborating with OT during education. Afterwards he ambulated back to room in manner as written above, however no LOBs and noted improvement in overall stability. Pt washed his hands, and then returned to bed. Sit<supine transfer to flat bed without bedrails completed with supervision assist once again. Pt was left in bed with all needs within reach and bed alarm set.      Therapy Documentation Precautions:  Precautions Precautions: Fall, Other (comment) Precaution Comments: left lateral LOB; vital sign precautions: maintain HR between 55-120, SBP 100-180, and DBP  60-100 Restrictions Weight Bearing Restrictions: No Vital Signs: Therapy Vitals Temp: 97.7 F (36.5 C) Temp Source: Oral Pulse Rate: 67 Resp: 20 BP: (!) 152/120 Patient Position (if appropriate): Lying Oxygen Therapy SpO2: 98 % ADL:       Therapy/Group: Individual Therapy  Daniah Zaldivar A Jazzlyn Huizenga 10/27/2018, 2:51 PM

## 2018-10-28 ENCOUNTER — Inpatient Hospital Stay (HOSPITAL_COMMUNITY): Payer: Medicare Other | Admitting: Physical Therapy

## 2018-10-28 ENCOUNTER — Inpatient Hospital Stay (HOSPITAL_COMMUNITY): Payer: Medicare Other | Admitting: Occupational Therapy

## 2018-10-28 ENCOUNTER — Inpatient Hospital Stay (HOSPITAL_COMMUNITY): Payer: Medicare Other | Admitting: Speech Pathology

## 2018-10-28 LAB — GLUCOSE, CAPILLARY
Glucose-Capillary: 155 mg/dL — ABNORMAL HIGH (ref 70–99)
Glucose-Capillary: 135 mg/dL — ABNORMAL HIGH (ref 70–99)
Glucose-Capillary: 123 mg/dL — ABNORMAL HIGH (ref 70–99)
Glucose-Capillary: 136 mg/dL — ABNORMAL HIGH (ref 70–99)
Glucose-Capillary: 170 mg/dL — ABNORMAL HIGH (ref 70–99)

## 2018-10-28 LAB — BODY FLUID CULTURE: Culture: NO GROWTH

## 2018-10-28 MED ORDER — BLOOD PRESSURE CONTROL BOOK
Freq: Once | Status: AC
  Administered 2018-10-28: 12:00:00
  Filled 2018-10-28: qty 1

## 2018-10-28 MED ORDER — LIVING WELL WITH DIABETES BOOK
Freq: Once | Status: AC
  Administered 2018-10-28: 12:00:00
  Filled 2018-10-28: qty 1

## 2018-10-28 NOTE — Progress Notes (Signed)
Occupational Therapy Session Note  Patient Details  Name: [name] MRN: [id number] Date of Birth: 02/12/1961  Today's Date: 10/28/2018 OT Individual Time: 3704-8889 OT Individual Time Calculation (min): 90 min   Short Term Goals: Week 1:  OT Short Term Goal 1 (Week 1): STGs=LTGs secondary to estimated short LOS  Skilled Therapeutic Interventions/Progress Updates:    Pt greeted in bed with no c/o pain. Requesting to shower. He completed toileting (using standard toilet), bathing (sitting on TTB), dressing (sit<stand from EOB using device), shaving + handwashing (standing at sink), and bedmaking during session. All functional transfers completed at ambulatory level using device with supervision-steady assist. Steady assist provided at times when he lost balance to the Lt side, often when rushing or when tired. Unna boots covered in shower, and he required supervision overall. Steady assist for UB dressing when standing due to small LOBs. Close supervision for balance when elevating pants over hips. He stood for 5 minutes at the sink to complete self care tasks, removing one or both UEs to meet demands. When engaging in IADL task of bedmaking, he ambulated around bed, side-stepped with device, and often removed B UE support from walker. Pt required vcs for safe walker positioning, energy conservation, and problem solving. He had a tough time figuring out the fitted sheet and required assist for orientation. He doffed/donned pillowcases while sitting. At end of tx pt returned to bed (elevated to level he has at home) without bedrails, with supervision. Pt was left in bed with all needs within reach and bed alarm set. Tx focus placed on dynamic balance, functional ambulation, safety awareness, and ADL retraining.     Therapy Documentation Precautions:  Precautions Precautions: Fall, Other (comment) Precaution Comments: left lateral LOB; vital sign precautions: maintain HR between 55-120, SBP  100-180, and DBP 60-100 Restrictions Weight Bearing Restrictions: No   ADL:       Therapy/Group: Individual Therapy  [name] [name] 10/28/2018, 4:14 PM

## 2018-10-28 NOTE — Progress Notes (Signed)
Physical Therapy Session Note  Patient Details  Name: [name] MRN: [id number] Date of Birth: [birth year]  Today's Date: 10/28/2018 PT Individual Time: 2536-6440 PT Individual Time Calculation (min): 45 min   Short Term Goals: Week 1:  PT Short Term Goal 1 (Week 1): Pt will ambulate at least 78f using RW with no more than CGA PT Short Term Goal 2 (Week 1): Pt will perform bed<>chair transfers with no more than CGA PT Short Term Goal 3 (Week 1): Pt will ascend/descend 4 steps using bilateral handrails with no more than CGA PT Short Term Goal 4 (Week 1): Pt will performed bed mobility independently without using bed features  Skilled Therapeutic Interventions/Progress Updates:   Pt received supine in bed and agreeable to PT. Supine>sit transfer without assist or cues. PT assessed BP sitting EOB . 152/85 HR 74.  Gait training through hall with RW x 1884fand CGA assist from PT. BP assessed following gait 133/106, HR 84. Pt reports no new s/s. PT instructed pt in gait training without AD 2 x 4070fin assist from PT. Min cues for improved R lateral step width. PT obtained wide RW gait training with wide RW, 4f4f2 (BP taken following gait with wide RW 149/93) and [id number]-supervision assist from PT with moderate cues to increased step width and R weight shift to prevent L lateral LOB.  Pt returned to room and performed ambulatory transfer to bed with RW and supervision assist. BP taken in sitting 170/99, aspytomatic.   Sit>supine completed with supervision assist, and left supine in bed with call bell in reach and all needs met.          Therapy Documentation Precautions:  Precautions Precautions: Fall, Other (comment) Precaution Comments: left lateral LOB; vital sign precautions: maintain HR between 55-120, SBP 100-180, and DBP 60-100 Restrictions Weight Bearing Restrictions: No Vital Signs: Therapy Vitals Pulse Rate: 90 BP: (!) 170/99 Patient Position (if appropriate):  Sitting Oxygen Therapy Patient Activity (if Appropriate): Ambulating(following ambulation) Pain: denies   Therapy/Group: Individual Therapy  AustiLorie Phenix/2020, 5:12 PM

## 2018-10-28 NOTE — Progress Notes (Signed)
[place] PHYSICAL MEDICINE & REHABILITATION PROGRESS NOTE   Subjective/Complaints:  Patient without new complaints today Review of systems occasional cough, no chest pain or shortness of breath,  Objective:   No results found. No results for input(s): WBC, HGB, HCT, PLT in the last 72 hours. No results for input(s): NA, K, CL, CO2, GLUCOSE, BUN, CREATININE, CALCIUM in the last 72 hours.  Intake/Output Summary (Last 24 hours) at 10/28/2018 1120 Last data filed at 10/28/2018 0727 Gross per 24 hour  Intake 476 ml  Output -  Net 476 ml     Physical Exam: Vital Signs Blood pressure 138/82, pulse (!) 58, temperature 97.7 F (36.5 C), temperature source Oral, resp. rate 16, height 5\' 10"  (1.778 m), weight 133 kg, SpO2 98 %.   General: No acute distress Mood and affect are appropriate Heart: Regular rate and rhythm no rubs murmurs or extra sounds Lungs: Clear to auscultation, breathing unlabored, no rales or wheezes Abdomen: Positive bowel sounds, soft nontender to palpation, nondistended Extremities: No clubbing, cyanosis, or edema Skin: No evidence of breakdown, no evidence of rash Neurologic: Cranial nerves II through XII intact, motor strength is4/5 RUE and  5/5 in LUEdeltoid, bicep, tricep, grip, 5/5 RLE, 4/5 LLE hip flexor, knee extensors, ankle dorsiflexor and plantar flexor  Cerebellar exam normal finger to nose to finger in left upper, mild dysmetria right upper musculoskeletal: Reduced left knee flexion and extension, suprapatellar bursa effusion noted, minimal tenderness, no erythema Assessment/Plan: 1. 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  Physiatrist is providing close team supervision and 24 hour management of active medical problems listed below.  Physiatrist and rehab team continue to assess barriers to discharge/monitor  patient progress toward functional and medical goals  Care Tool:  Bathing  Bathing activity did not occur: Refused Body parts bathed by patient: Right arm, Left arm, Chest, Abdomen, Front perineal area, Right upper leg, Left upper leg, Face   Body parts bathed by helper: Buttocks Body parts n/a: Right lower leg, Left lower leg   Bathing assist Assist Level: Minimal Assistance - Patient > 75%     Upper Body Dressing/Undressing Upper body dressing Upper body dressing/undressing activity did not occur (including orthotics): Refused What is the patient wearing?: Hospital gown only    Upper body assist Assist Level: Set up assist    Lower Body Dressing/Undressing Lower body dressing    Lower body dressing activity did not occur: Refused What is the patient wearing?: Hospital gown only     Lower body assist       Toileting Toileting Toileting Activity did not occur Landscape architect and hygiene only): Refused  Toileting assist Assist for toileting: Minimal Assistance - Patient > 75%     Transfers Chair/bed transfer  Transfers assist  Chair/bed transfer activity did not occur: N/A  Chair/bed transfer assist level: Contact Guard/Touching assist     Locomotion Ambulation   Ambulation assist   Ambulation activity did not occur: Refused  Assist level: Contact Guard/Touching assist Assistive device: Walker-rolling Max distance: 180   Walk 10 feet activity   Assist     Assist level: Contact Guard/Touching assist Assistive device: Walker-rolling   Walk 50 feet activity   Assist Walk 50 feet with 2 turns activity did not occur: Safety/medical concerns  Assist level: Contact Guard/Touching assist Assistive device: Walker-rolling    Walk 150 feet activity   Assist Walk 150  feet activity did not occur: Safety/medical concerns  Assist level: Contact Guard/Touching assist Assistive device: Walker-rolling    Walk 10 feet on uneven surface   activity   Assist Walk 10 feet on uneven surfaces activity did not occur: (up/down ramp)   Assist level: Moderate Assistance - Patient - 50 - 74% Assistive device: Aeronautical engineer Will patient use wheelchair at discharge?: No             Wheelchair 50 feet with 2 turns activity    Assist            Wheelchair 150 feet activity     Assist            Medical Problem List and Plan: 1.Functional and mobility deficitssecondary to presumptive brainstem infarct, RUE weakness and ataxia,diplopia, likely left pontine infarct -CIR PT OT, continue rehab 2. Antithrombotics: -DVT/anticoagulation:Pharmaceutical:Lovenox -antiplatelet therapy: ASA/Plavix X 3 weeks followed by Plavix alone. 3. Pain Management:tylenol prn. Left knee effusion gm stain neg, elevated WBC in synovial fluid, +urate crystals c/w gout avoid NSAIDs and colchicine due to CKD, expect steroid injection to be effective    4. Mood:LCSW to follow for evaluation and support. -antipsychotic agents: N/A 5. Neuropsych: This patientiscapable of making decisions on hisown behalf. 6. Skin/Wound Care:Routine pressure relief measures. Continue unna boots for edema control--change weekly. These need to be re-applied to provide more compression. Contact WOC RN change weekly 7. Fluids/Electrolytes/Nutrition:Monitor I/O. Check lytes in am. 8. T2DM:Will monitor BS achs. Continue 70/30 insulin bid and titrate as indicated.  CBG (last 3)  Recent Labs    10/27/18 1807 10/27/18 2115 10/28/18 0629  GLUCAP 232* 155* 135*  elevated  5/22, increase 70/30 dose, p.m. 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 Follow up labs---infusion ordered for 5/29 per Dr. Marval Regal. 10.Diastolic HF/DOE: Monitor weights daily. Albuterol prnSOB.Unna boots 11.CHB s/p PPM: Continue to monitor HR bid.  Will monitor weights daily as lasix on hold. 12. HTN: Monitor BP bid. On Norvasc daily. Lasix on hold at this time. Vitals:   10/27/18 1933 10/28/18 0501  BP: 136/86 138/82  Pulse: 73 (!) 58  Resp: 18 16  Temp: 97.7 F (36.5 C) 97.7 F (36.5 C)  SpO2: 94% 98%  Controlled continue current medications 13. Dyslipidemia:On Lipitor 80 mg daily. 14.  Question COPD using inhalers at home non-smoker Morbid obesity- BMI 42 diet , exercise, some of this weight is fluid related as well 15.  Left knee effusion , symptomatically improved after arthrocentesis, gout, Ortho on consult LOS: 5 days A FACE TO FACE EVALUATION WAS PERFORMED  Charlett Blake 10/28/2018, 11:20 AM

## 2018-10-28 NOTE — Progress Notes (Signed)
Speech Language Pathology Daily Session Note  Patient Details  Name: [name] MRN: [id number] Date of Birth: [birth year]  Today's Date: 10/28/2018 SLP Individual Time: 1314-3888 SLP Individual Time Calculation (min): 43 min  Short Term Goals: Week 1: SLP Short Term Goal 1 (Week 1): Pt will consume current diet with minimal overt s/s of aspiration or dysphagia.  SLP Short Term Goal 2 (Week 1): Pt will consume trials of dysphagia 3 with no increase of overt s/s of aspiration or dysphagia.  Skilled Therapeutic Interventions: Patient received skilled SLP services to address dysphagia. Upon entry to room patient had just finished breakfast tray. Patient reports he is tolerating diet advancement to regular solids without issue. Patient demonstrated functional mastication of regular solids with no oral residues. No overt s/sx of aspiration were observed with 4oz of thin liquids or regular solid consistency. Patient had questions regarding swallow mechanism. Education was provided to patient on swallow mechanism and s/sx of aspiration. Patient was shown images of modified barium swallow studies to further educate on the swallow mechanism. Recommend continue regular diet and thin liquids.   Pain Pain Assessment Pain Scale: 0-10 Pain Score: 0-No pain  Therapy/Group: Individual Therapy  Cristy Folks, [place] 10/28/2018, 8:32 AM

## 2018-10-29 LAB — GLUCOSE, CAPILLARY
Glucose-Capillary: 117 mg/dL — ABNORMAL HIGH (ref 70–99)
Glucose-Capillary: 122 mg/dL — ABNORMAL HIGH (ref 70–99)
Glucose-Capillary: 160 mg/dL — ABNORMAL HIGH (ref 70–99)
Glucose-Capillary: 159 mg/dL — ABNORMAL HIGH (ref 70–99)

## 2018-10-29 NOTE — Progress Notes (Signed)
[place] PHYSICAL MEDICINE & REHABILITATION PROGRESS NOTE   Subjective/Complaints:  Complaints of facial pain with numbness, this is not severe mainly irritating on the left side of his face.  Continues to experience stacked diplopia with straightahead gaze Review of systems occasional cough, no chest pain or shortness of breath,  Objective:   No results found. No results for input(s): WBC, HGB, HCT, PLT in the last 72 hours. No results for input(s): NA, K, CL, CO2, GLUCOSE, BUN, CREATININE, CALCIUM in the last 72 hours.  Intake/Output Summary (Last 24 hours) at 10/29/2018 1018 Last data filed at 10/29/2018 0850 Gross per 24 hour  Intake 798 ml  Output 2000 ml  Net -1202 ml     Physical Exam: Vital Signs Blood pressure (!) 159/95, pulse 66, temperature 98.5 F (36.9 C), temperature source Oral, resp. rate 15, height 5\' 10"  (1.778 m), weight 133.4 kg, SpO2 99 %.   General: No acute distress Mood and affect are appropriate Heart: Regular rate and rhythm no rubs murmurs or extra sounds Lungs: Clear to auscultation, breathing unlabored, no rales or wheezes Abdomen: Positive bowel sounds, soft nontender to palpation, nondistended Extremities: No clubbing, cyanosis, or edema Skin: No evidence of breakdown, no evidence of rash Neurologic: Cranial nerves II through XII intact, motor strength is4/5 RUE and  5/5 in LUEdeltoid, bicep, tricep, grip, 5/5 RLE, 4/5 LLE hip flexor, knee extensors, ankle dorsiflexor and plantar flexor There is no evidence of horizontal nystagmus, no diplopia on lateral gaze. Cerebellar exam normal finger to nose to finger in left upper, mild dysmetria right upper musculoskeletal: Reduced left knee flexion and extension, suprapatellar bursa effusion noted, minimal tenderness, no erythema Assessment/Plan: 1. 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  Physiatrist is providing close team supervision and 24 hour management of active medical problems listed below.  Physiatrist and rehab team continue to assess barriers to discharge/monitor patient progress toward functional and medical goals  Care Tool:  Bathing  Bathing activity did not occur: Refused Body parts bathed by patient: Right arm, Left arm, Chest, Abdomen, Front perineal area, Right upper leg, Left upper leg, Face, Buttocks   Body parts bathed by helper: Buttocks Body parts n/a: Right lower leg, Left lower leg   Bathing assist Assist Level: Supervision/Verbal cueing     Upper Body Dressing/Undressing Upper body dressing Upper body dressing/undressing activity did not occur (including orthotics): Refused What is the patient wearing?: Pull over shirt    Upper body assist Assist Level: Contact Guard/Touching assist(standing)    Lower Body Dressing/Undressing Lower body dressing    Lower body dressing activity did not occur: Refused What is the patient wearing?: Pants     Lower body assist Assist for lower body dressing: Supervision/Verbal cueing     Toileting Toileting Toileting Activity did not occur Landscape architect and hygiene only): Refused  Toileting assist Assist for toileting: Contact Guard/Touching assist     Transfers Chair/bed transfer  Transfers assist  Chair/bed transfer activity did not occur: N/A  Chair/bed transfer assist level: Contact Guard/Touching assist     Locomotion Ambulation   Ambulation assist   Ambulation activity did not occur: Refused  Assist level: Contact Guard/Touching assist Assistive device: Walker-rolling Max distance: 180   Walk 10 feet activity   Assist     Assist level: Contact Guard/Touching assist Assistive device: Walker-rolling   Walk 50 feet activity   Assist Walk  50 feet with 2 turns activity did not occur: Safety/medical concerns  Assist level:  Contact Guard/Touching assist Assistive device: Walker-rolling    Walk 150 feet activity   Assist Walk 150 feet activity did not occur: Safety/medical concerns  Assist level: Contact Guard/Touching assist Assistive device: Walker-rolling    Walk 10 feet on uneven surface  activity   Assist Walk 10 feet on uneven surfaces activity did not occur: (up/down ramp)   Assist level: Moderate Assistance - Patient - 50 - 74% Assistive device: Aeronautical engineer Will patient use wheelchair at discharge?: No             Wheelchair 50 feet with 2 turns activity    Assist            Wheelchair 150 feet activity     Assist            Medical Problem List and Plan: 1.Functional and mobility deficitssecondary to presumptive brainstem infarct, RUE weakness and ataxia,diplopia, likely left pontine infarct -CIR PT OT, continue rehab 2. Antithrombotics: -DVT/anticoagulation:Pharmaceutical:Lovenox -antiplatelet therapy: ASA/Plavix X 3 weeks followed by Plavix alone. 3. Pain Management:tylenol prn. Left knee effusion gm stain neg, elevated WBC in synovial fluid, +urate crystals c/w gout avoid NSAIDs and colchicine due to CKD, expect steroid injection to be effective, culture no growth at 3 days    4. Mood:LCSW to follow for evaluation and support. -antipsychotic agents: N/A 5. Neuropsych: This patientiscapable of making decisions on hisown behalf. 6. Skin/Wound Care:Routine pressure relief measures. Continue unna boots for edema control--change weekly. These need to be re-applied to provide more compression. Contact WOC RN change weekly 7. Fluids/Electrolytes/Nutrition:Monitor I/O. Check lytes in am. 8. T2DM:Will monitor BS achs. Continue 70/30 insulin bid and titrate as indicated.  CBG (last 3)  Recent Labs    10/28/18 1636 10/28/18 2120 10/29/18 0612  GLUCAP 136* 170* 117*  elevated   5/24, increase 70/30 dose, p.m. 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 Follow up labs---infusion ordered for 5/29 per Dr. Marval Regal. 10.Diastolic HF/DOE: Monitor weights daily. Albuterol prnSOB.Unna boots 11.CHB s/p PPM: Continue to monitor HR bid. Will monitor weights daily as lasix on hold. 12. HTN: Monitor BP bid. On Norvasc daily. Lasix on hold at this time. Vitals:   10/28/18 1941 10/29/18 0432  BP: (!) 152/80 (!) 159/95  Pulse: 62 66  Resp: 16 15  Temp: 98 F (36.7 C) 98.5 F (36.9 C)  SpO2: 99% 99%  Controlled continue current medications 13. Dyslipidemia:On Lipitor 80 mg daily. 14.  Question COPD using inhalers at home non-smoker Morbid obesity- BMI 42 diet , exercise, some of this weight is fluid related as well 15.  Left knee effusion , symptomatically improved after arthrocentesis, gout, Ortho on consult LOS: 6 days A FACE TO FACE EVALUATION WAS PERFORMED  Charlett Blake 10/29/2018, 10:18 AM

## 2018-10-30 ENCOUNTER — Inpatient Hospital Stay (HOSPITAL_COMMUNITY): Payer: Medicare Other | Admitting: Occupational Therapy

## 2018-10-30 ENCOUNTER — Inpatient Hospital Stay (HOSPITAL_COMMUNITY): Payer: Medicare Other | Admitting: Speech Pathology

## 2018-10-30 ENCOUNTER — Inpatient Hospital Stay (HOSPITAL_COMMUNITY): Payer: Medicare Other | Admitting: Physical Therapy

## 2018-10-30 DIAGNOSIS — I10 Essential (primary) hypertension: Secondary | ICD-10-CM

## 2018-10-30 LAB — BASIC METABOLIC PANEL
Anion gap: 9 (ref 5–15)
BUN: 34 mg/dL — ABNORMAL HIGH (ref 6–20)
CO2: 25 mmol/L (ref 22–32)
Calcium: 8.6 mg/dL — ABNORMAL LOW (ref 8.9–10.3)
Chloride: 108 mmol/L (ref 98–111)
Creatinine, Ser: 1.78 mg/dL — ABNORMAL HIGH (ref 0.61–1.24)
GFR calc Af Amer: 48 mL/min — ABNORMAL LOW (ref >60)
GFR calc non Af Amer: 41 mL/min — ABNORMAL LOW (ref >60)
Glucose, Bld: 137 mg/dL — ABNORMAL HIGH (ref 70–99)
Potassium: 4.6 mmol/L (ref 3.5–5.1)
Sodium: 142 mmol/L (ref 135–145)

## 2018-10-30 LAB — CBC
HCT: 47.7 % (ref 39.0–52.0)
Hemoglobin: 15.4 g/dL (ref 13.0–17.0)
MCH: 29.4 pg (ref 26.0–34.0)
MCHC: 32.3 g/dL (ref 30.0–36.0)
MCV: 91 fL (ref 80.0–100.0)
Platelets: 270 10*3/uL (ref 150–400)
RBC: 5.24 MIL/uL (ref 4.22–5.81)
RDW: 13.2 % (ref 11.5–15.5)
WBC: 9.5 10*3/uL (ref 4.0–10.5)
nRBC: 0.2 % (ref 0.0–0.2)

## 2018-10-30 LAB — GLUCOSE, CAPILLARY
Glucose-Capillary: 122 mg/dL — ABNORMAL HIGH (ref 70–99)
Glucose-Capillary: 234 mg/dL — ABNORMAL HIGH (ref 70–99)
Glucose-Capillary: 172 mg/dL — ABNORMAL HIGH (ref 70–99)

## 2018-10-30 MED ORDER — FUROSEMIDE 20 MG PO TABS
20.0000 mg | ORAL_TABLET | Freq: Every day | ORAL | Status: DC
  Administered 2018-10-30 – 2018-11-02 (×4): 20 mg via ORAL
  Filled 2018-10-30 (×4): qty 1

## 2018-10-30 NOTE — Progress Notes (Addendum)
PHYSICAL MEDICINE & REHABILITATION PROGRESS NOTE   Subjective/Complaints:  No new complaints today.  ROS: Patient denies fever, rash, sore throat, blurred vision, nausea, vomiting, diarrhea, cough, shortness of breath or chest pain, joint or back pain, headache, or mood change.    Objective:   No results found. Recent Labs    10/30/18 0634  WBC 9.5  HGB 15.4  HCT 47.7  PLT 270   Recent Labs    10/30/18 0634  NA 142  K 4.6  CL 108  CO2 25  GLUCOSE 137*  BUN 34*  CREATININE 1.78*  CALCIUM 8.6*    Intake/Output Summary (Last 24 hours) at 10/30/2018 0932 Last data filed at 10/30/2018 0400 Gross per 24 hour  Intake 660 ml  Output 1975 ml  Net -1315 ml     Physical Exam: Vital Signs Blood pressure (!) 148/78, pulse 63, temperature 98 F (36.7 C), temperature source Oral, resp. rate 16, height 5\' 10"  (1.778 m), weight 133.3 kg, SpO2 98 %.   Constitutional: No distress . Vital signs reviewed. obese HEENT: EOMI, oral membranes moist Neck: supple Cardiovascular: RRR without murmur. No JVD    Respiratory: CTA Bilaterally without wheezes or rales. Normal effort    GI: BS +, non-tender, non-distended  Extremities: No clubbing, cyanosis, or edema Skin: No evidence of breakdown, no evidence of rash Neurologic: Cranial nerves II through XII intact, motor strength is4/5 RUE and  5/5 in LUEdeltoid, bicep, tricep, grip, 5/5 RLE, 4/5 LLE hip flexor, knee extensors, ankle dorsiflexor and plantar flexor--stable exam Right UE dysmetria MUSCl: Reduced left knee flexion and extension, suprapatellar bursa effusion noted, minimal tenderness, no erythema   Assessment/Plan: 1. 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  Physiatrist is providing close team supervision and 24 hour management of active medical problems listed  below.  Physiatrist and rehab team continue to assess barriers to discharge/monitor patient progress toward functional and medical goals  Care Tool:  Bathing  Bathing activity did not occur: Refused Body parts bathed by patient: Right arm, Left arm, Chest, Abdomen, Front perineal area, Right upper leg, Left upper leg, Face, Buttocks   Body parts bathed by helper: Buttocks Body parts n/a: Right lower leg, Left lower leg   Bathing assist Assist Level: Supervision/Verbal cueing     Upper Body Dressing/Undressing Upper body dressing Upper body dressing/undressing activity did not occur (including orthotics): Refused What is the patient wearing?: Pull over shirt    Upper body assist Assist Level: Contact Guard/Touching assist(standing)    Lower Body Dressing/Undressing Lower body dressing    Lower body dressing activity did not occur: Refused What is the patient wearing?: Pants     Lower body assist Assist for lower body dressing: Supervision/Verbal cueing     Toileting Toileting Toileting Activity did not occur Landscape architect and hygiene only): Refused  Toileting assist Assist for toileting: Contact Guard/Touching assist     Transfers Chair/bed transfer  Transfers assist  Chair/bed transfer activity did not occur: N/A  Chair/bed transfer assist level: Contact Guard/Touching assist     Locomotion Ambulation   Ambulation assist   Ambulation activity did not occur: Refused  Assist level: Contact Guard/Touching assist Assistive device: Walker-rolling Max distance: 180   Walk 10 feet activity   Assist     Assist level: Contact Guard/Touching assist Assistive device: Walker-rolling   Walk 50 feet activity   Assist Walk  50 feet with 2 turns activity did not occur: Safety/medical concerns  Assist level: Contact Guard/Touching assist Assistive device: Walker-rolling    Walk 150 feet activity   Assist Walk 150 feet activity did not occur:  Safety/medical concerns  Assist level: Contact Guard/Touching assist Assistive device: Walker-rolling    Walk 10 feet on uneven surface  activity   Assist Walk 10 feet on uneven surfaces activity did not occur: (up/down ramp)   Assist level: Moderate Assistance - Patient - 50 - 74% Assistive device: Aeronautical engineer Will patient use wheelchair at discharge?: No             Wheelchair 50 feet with 2 turns activity    Assist            Wheelchair 150 feet activity     Assist            Medical Problem List and Plan: 1.Functional and mobility deficitssecondary to presumptive brainstem infarct, RUE weakness and ataxia,diplopia, likely left pontine infarct -CIR PT OT, continue rehab 2. Antithrombotics: -DVT/anticoagulation:Pharmaceutical:Lovenox -antiplatelet therapy: ASA/Plavix X 3 weeks followed by Plavix alone. 3. Pain Management:tylenol prn. Left knee effusion gm stain neg, elevated WBC in synovial fluid, +urate crystals c/w gout avoid NSAIDs and colchicine due to CKD,  expect steroid injection to be effective, culture no growth at 3 days    4. Mood:LCSW to follow for evaluation and support. -antipsychotic agents: N/A 5. Neuropsych: This patientiscapable of making decisions on hisown behalf. 6. Skin/Wound Care:Routine pressure relief measures. Continue unna boots for edema control--change weekly. These need to be re-applied to provide more compression. Contact WOC RN change weekly, tomorrow 7. Fluids/Electrolytes/Nutrition:Monitor I/O.   -I personally reviewed the patient's labs today.   8. T2DM:Will monitor BS achs. Continue 70/30 insulin bid and titrate as indicated.  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 Follow up labs reviewed--infusion ordered for 5/29 per Dr. Marval Regal. 10.Diastolic HF/DOE: Monitor weights daily. Albuterol prnSOB.Unna boots  -resume lasix 11.CHB s/p PPM: Continue to monitor HR bid. Will monitor weights daily as lasix on hold. 12. HTN: Monitor BP bid. On Norvasc daily. Lasix on hold at this time. Vitals:   10/30/18 0359 10/30/18 0810  BP: (!) 143/77 (!) 148/78  Pulse: 63   Resp: 16   Temp: 98 F (36.7 C)   SpO2: 98%   sl elevation, given CHF will resume home dose lasix 20mg  daily 13. Dyslipidemia:On Lipitor 80 mg daily. 14.  Question COPD using inhalers at home non-smoker Morbid obesity- BMI 42 diet , exercise, some of this weight is fluid related as well 15.  Left knee effusion , symptomatically improved after arthrocentesis, gout, Ortho on consult  LOS: 7 days A FACE TO FACE EVALUATION WAS PERFORMED  [name] 10/30/2018, 9:32 AM

## 2018-10-30 NOTE — Progress Notes (Signed)
Occupational Therapy Session Note  Patient Details  Name: [name] MRN: [id number] Date of Birth: [birth year]  Today's Date: 10/30/2018 OT Individual Time: 2956-2130 OT Individual Time Calculation (min): 46 min   Short Term Goals: Week 1:  OT Short Term Goal 1 (Week 1): STGs=LTGs secondary to estimated short LOS  Skilled Therapeutic Interventions/Progress Updates:    Pt greeted in bed with no c/o pain. ADL needs met. Therefore tx focus placed on IADL retraining. He ambulated with RW and close supervision to therapy apartment. There, he engaged in simulated breakfast prep using device. Educated pt to purchase a walker bag for increasing ease of item transport. There are currently no bags in supply. Discussed placing ADL items within easy reach at home. Also advised placing a small table beside counter, as well as a chair so he can eat his meal close to prep location. He ambulated in kitchen to make "bacon and eggs" at the stovetop. Pt did well with proper positioning of device when retrieving items from fridge and preparing items while standing at counter. Also reviewed w/c level meal prep with education emphasis placed on locking w/c brakes during dynamic sitting activity. He loaded the dishwasher w/c level and retrieved some items from the fridge. Educated pt that having one CVA increases his risk for 2nd CVA. Discussed including extra servings of fruits/veggies at meals for CVA prevention. Pt also transferred to recliner and couch during session at supervision level. He then ambulated back to room with min vcs for widening BOS with L LE. No LOBs during session! Pt transferred back to bed without bedrails, raised to height per setup at home, with supervision. At end of tx pt was left in bed with all needs within reach and bed alarm set.   Therapy Documentation Precautions:  Precautions Precautions: Fall, Other (comment) Precaution Comments: left lateral LOB; vital sign precautions: maintain  HR between 55-120, SBP 100-180, and DBP 60-100 Restrictions Weight Bearing Restrictions: No Vital Signs: Therapy Vitals BP: (!) 148/78 Pain: Pain Assessment Pain Scale: 0-10 Pain Score: 0-No pain ADL:       Therapy/Group: Individual Therapy  Ajmal Kathan A Fronnie Urton 10/30/2018, 12:07 PM

## 2018-10-30 NOTE — Progress Notes (Signed)
Physical Therapy Session Note  Patient Details  Name: [name] MRN: [id number] Date of Birth: September 22, 1960  Today's Date: 10/30/2018 PT Individual Time: 1130-1230  PT Individual Time Calculation (min): 60 min   Short Term Goals: Week 1:  PT Short Term Goal 1 (Week 1): Pt will ambulate at least 67f using RW with no more than CGA PT Short Term Goal 2 (Week 1): Pt will perform bed<>chair transfers with no more than CGA PT Short Term Goal 3 (Week 1): Pt will ascend/descend 4 steps using bilateral handrails with no more than CGA PT Short Term Goal 4 (Week 1): Pt will performed bed mobility independently without using bed features  Skilled Therapeutic Interventions/Progress Updates:   Pt received supine in bed and agreeable to PT. Supine>sit transfer without assist and or cues. Gait training instructed by PT x 204fto rehab gym with RW and supervision assist. BP assessed after short rest break  153/86.  [id number]  Dynamic standing balance training  Toss ball off trampoline 2 x 1 min  Tap ball on target R and L 2 x 30 sec.  diagonals 2 x 15 Bil with BUE holding ball.  CGA for safety from PT with cues for midline orientation and improved trunk rotation.     dynamic gait training with RW to weave through 3 cones  And step over 1 SPC x 4 with CGA-supervision assist from PT with cues for AD management, gait pattern and safety awareness. Weave through 6 cones x 2 with no AD and min assist from PT. Multiple short break after mild LOB to the L in turns.  Gait training without AD in hall of rehab unit x 17531fith min assist overall and mod assist x 2 to prevent LOB in turns. Cues for posture, step width, and improved R lateral weight shift to prevent L/anterior LOB.  Reciprocal toe taps on 6 inch styep x 5 and x 8 with BUE support on rails.  Ascend/descend 6inch steps x 8 with min-CGA overall and mod assist x 1 when attempting without UE support.   Pt returned to room and performed ambulatory  transfer to bed with supervision assist. Sit>supine completed without assist, and left supine in bed with call bell in reach and all needs met.       Therapy Documentation Precautions:  Precautions Precautions: Fall, Other (comment) Precaution Comments: left lateral LOB; vital sign precautions: maintain HR between 55-120, SBP 100-180, and DBP 60-100 Restrictions Weight Bearing Restrictions: No Pain: Pain Assessment Pain Scale: 0-10 Pain Score: 0-No pain    Therapy/Group: Individual Therapy  AusLorie Phenix25/2020, 12:32 PM

## 2018-10-30 NOTE — Progress Notes (Signed)
Speech Language Pathology Discharge Summary  Patient Details  Name: [name] MRN: [id number] Date of Birth: [birth year]  Today's Date: 10/30/2018 SLP Individual Time: 0830-0930 SLP Individual Time Calculation (min): 60 min   Skilled Therapeutic Interventions:  Skilled treatment session focused on dysphagia goals as well as completing education. Pt currently consumes regular diet with thin liquids without overt s/s of aspiration. He is very insightful and reports that he has improved sense and taste of food which results in better oral management of boluses. Education completed on general aspiration precautions, pt able to retain information and demonstrate knowledge/insight. At this time no further skilled ST is indicated.     Patient has met 3 of 3 long term goals.  Patient to discharge at overall Independent level.     Clinical Impression/Discharge Summary:   Pt has made progress and is currently consuming regular diet with thin liquids over the course of multiple days with no adverse s/s of aspiration or dysphagia. All education has been completed. ST to sign off with no further need for ST services.  Care Partner:        Recommendation:  None      Equipment:     Reasons for discharge: Treatment goals met   Patient/Family Agrees with Progress Made and Goals Achieved: Yes    Jariah Jarmon 10/30/2018, 8:50 AM

## 2018-10-31 ENCOUNTER — Inpatient Hospital Stay (HOSPITAL_COMMUNITY): Payer: Medicare Other | Admitting: Occupational Therapy

## 2018-10-31 ENCOUNTER — Inpatient Hospital Stay (HOSPITAL_COMMUNITY): Payer: Medicare Other | Admitting: Physical Therapy

## 2018-10-31 ENCOUNTER — Inpatient Hospital Stay (HOSPITAL_COMMUNITY): Payer: Medicare Other

## 2018-10-31 LAB — GLUCOSE, CAPILLARY
Glucose-Capillary: 112 mg/dL — ABNORMAL HIGH (ref 70–99)
Glucose-Capillary: 122 mg/dL — ABNORMAL HIGH (ref 70–99)
Glucose-Capillary: 202 mg/dL — ABNORMAL HIGH (ref 70–99)
Glucose-Capillary: 177 mg/dL — ABNORMAL HIGH (ref 70–99)

## 2018-10-31 NOTE — Progress Notes (Signed)
Occupational Therapy Session Note  Patient Details  Name: DEMERIUS PODOLAK MRN: [id number] Date of Birth: 1961/03/14  Today's Date: 10/31/2018 OT Individual Time: 0350-0938 OT Individual Time Calculation (min): 54 min    Short Term Goals: Week 1:  OT Short Term Goal 1 (Week 1): STGs=LTGs secondary to estimated short LOS  Skilled Therapeutic Interventions/Progress Updates:    Upon entering the room, pt supine in bed but agreeable to OT intervention. Pt performed bed mobility with supervision. Pt ambulating with RW 200' towards ADL apartment with close supervision. Pt begins to "sway" more towards the L with cuing to self correct. Pt seated on love seat in ADL apartment. OT demonstrated floor transfer with pt returning demonstrations with min guard for balance and pt practicing getting up on both L and then R LE. OT also discussed pt's fatigue towards the end of day and recommendations for safety such as keeping cell phone on at all times and use of urinal at night to decrease fall risk trying to get to bathroom. Pt verbalized understanding and agreement. Pt ambulating back to room in same manner and returning to bed level. Bed alarm activated and call bell within reach.   Therapy Documentation Precautions:  Precautions Precautions: Fall, Other (comment) Precaution Comments: left lateral LOB; vital sign precautions: maintain HR between 55-120, SBP 100-180, and DBP 60-100 Restrictions Weight Bearing Restrictions: No General:   Vital Signs: Therapy Vitals Temp: 98.9 F (37.2 C) Temp Source: Oral Pulse Rate: 86 Resp: 18 BP: (!) 134/95 Patient Position (if appropriate): Lying Oxygen Therapy SpO2: (!) 88 % O2 Device: Room Air   Therapy/Group: Individual Therapy  Gypsy Decant 10/31/2018, 4:20 PM

## 2018-10-31 NOTE — Progress Notes (Signed)
Physical Therapy Session Note  Patient Details  Name: [name] MRN: [id number] Date of Birth: [birth year]  Today's Date: 10/31/2018 PT Individual Time: 0800-0900 PT Individual Time Calculation (min): 60 min   Short Term Goals: Week 1:  PT Short Term Goal 1 (Week 1): Pt will ambulate at least 39ft using RW with no more than CGA PT Short Term Goal 2 (Week 1): Pt will perform bed<>chair transfers with no more than CGA PT Short Term Goal 3 (Week 1): Pt will ascend/descend 4 steps using bilateral handrails with no more than CGA PT Short Term Goal 4 (Week 1): Pt will performed bed mobility independently without using bed features  Skilled Therapeutic Interventions/Progress Updates:    Pt received semi-reclined in bed, agreeable to PT session. No complaints of pain. Bed mobility mod I with use of bedrail. Sit to stand with Supervision to RW. Pt requesting to shower this AM. Ambulation with RW to bathroom with Supervision. Plastic-wrapped BLE for shower. Pt is able to complete showering of entire body (except for BLE) with Supervision assist for sitting on shower chair and for standing in shower with use of grab bar for safety. Pt is able to dry himself off with Supervision assist. Standing balance with Supervision and RW while urinating in toilet. Pt is Supervision to don a new gown. Ambulation 2 x 200 ft with RW and Supervision. Seated BP following gait 170/94, HR 80. Seated rest break due to high BP. Discussion with patient about safety at home upon d/c. Seated BP 135/97 after seated rest break. Pt left seated in w/c in room with needs in reach at end of session.  Therapy Documentation Precautions:  Precautions Precautions: Fall, Other (comment) Precaution Comments: left lateral LOB; vital sign precautions: maintain HR between 55-120, SBP 100-180, and DBP 60-100 Restrictions Weight Bearing Restrictions: No    Therapy/Group: Individual Therapy   Excell Seltzer, PT, DPT  10/31/2018,  10:39 AM

## 2018-10-31 NOTE — Progress Notes (Signed)
Social Work [name], male   DOB: Nov 03, 1960, 58 y.o.   MRN: [id number] Met with pt to discuss discharge needs. Has a rolling walker and has had home health in the past. Is ok with the cost of the tub bench. Have ordered tub bench and see if [facility] will pick up for follow up. He will need another prescription for insulin due to has been out of the refrigerator since he has ben here due to broken refrigerator

## 2018-10-31 NOTE — Progress Notes (Signed)
Occupational Therapy Session Note  Patient Details  Name: [name] MRN: [id number] Date of Birth: 10/22/1960  Today's Date: 10/31/2018 OT Individual Time: 1300-1345 OT Individual Time Calculation (min): 45 min    Short Term Goals: Week 1:  OT Short Term Goal 1 (Week 1): STGs=LTGs secondary to estimated short LOS  Skilled Therapeutic Interventions/Progress Updates:    1:1 Therapeutic activities with focus on endurance and activity tolerance and dynamic balance. Pt ambulated to bathroom and then to gym with contact guard to min A without RW. Pt with frequent LOB but improving on postural adjustments to self correct. In the fym alternating working on UEs and LEs. Sit to stands while pushup weight bar up above head, zoom ball, rowing with weighted bar (3 lbs) and then tricep pulls, and top taps on 3 inch step and then stepping up onto step with both feet with a surface for UE support and then standing statically on compliant surface. Ambulated back to room without AD with min A.   Therapy Documentation Precautions:  Precautions Precautions: Fall, Other (comment) Precaution Comments: left lateral LOB; vital sign precautions: maintain HR between 55-120, SBP 100-180, and DBP 60-100 Restrictions Weight Bearing Restrictions: No General:   Vital Signs: Therapy Vitals Temp: 98.9 F (37.2 C) Temp Source: Oral Pulse Rate: 86 Resp: 18 BP: (!) 134/95 Patient Position (if appropriate): Lying Oxygen Therapy SpO2: (!) 88 % O2 Device: Room Air Pain:  no c/o pain    Therapy/Group: Individual Therapy  Willeen Cass [facility] 10/31/2018, 3:42 PM

## 2018-10-31 NOTE — Progress Notes (Signed)
[facility] PHYSICAL MEDICINE & REHABILITATION PROGRESS NOTE   Subjective/Complaints:  No issues overnite   ROS: Patient denies CP, SOB, N/V/D  Objective:   No results found. Recent Labs    10/30/18 0634  WBC 9.5  HGB 15.4  HCT 47.7  PLT 270   Recent Labs    10/30/18 0634  NA 142  K 4.6  CL 108  CO2 25  GLUCOSE 137*  BUN 34*  CREATININE 1.78*  CALCIUM 8.6*    Intake/Output Summary (Last 24 hours) at 10/31/2018 0900 Last data filed at 10/31/2018 0755 Gross per 24 hour  Intake 1200 ml  Output 3150 ml  Net -1950 ml     Physical Exam: Vital Signs Blood pressure (!) 151/84, pulse (!) 59, temperature 98 F (36.7 C), temperature source Oral, resp. rate 16, height 5\' 10"  (1.778 m), weight 133.4 kg, SpO2 100 %.   Constitutional: No distress . Vital signs reviewed. obese HEENT: EOMI, oral membranes moist Neck: supple Cardiovascular: RRR without murmur. No JVD    Respiratory: CTA Bilaterally without wheezes or rales. Normal effort    GI: BS +, non-tender, non-distended  Extremities: No clubbing, cyanosis, or edema Skin: No evidence of breakdown, no evidence of rash Neurologic: Cranial nerves II through XII intact, motor strength is4/5 RUE and  5/5 in LUEdeltoid, bicep, tricep, grip, 5/5 RLE, 4/5 LLE hip flexor, knee extensors, ankle dorsiflexor and plantar flexor--stable exam Right UE dysmetria MUSCl: Reduced left knee flexion and extension, suprapatellar bursa effusion noted, minimal tenderness, no erythema   Assessment/Plan: 1. 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  Physiatrist is providing close team supervision and 24 hour management of active medical problems listed below.  Physiatrist and rehab team continue to assess barriers to discharge/monitor patient progress toward functional and medical goals  Care  Tool:  Bathing  Bathing activity did not occur: Refused Body parts bathed by patient: Right arm, Left arm, Chest, Abdomen, Front perineal area, Right upper leg, Left upper leg, Face, Buttocks   Body parts bathed by helper: Buttocks Body parts n/a: Right lower leg, Left lower leg   Bathing assist Assist Level: Supervision/Verbal cueing     Upper Body Dressing/Undressing Upper body dressing Upper body dressing/undressing activity did not occur (including orthotics): Refused What is the patient wearing?: Pull over shirt    Upper body assist Assist Level: Contact Guard/Touching assist(standing)    Lower Body Dressing/Undressing Lower body dressing    Lower body dressing activity did not occur: Refused What is the patient wearing?: Pants     Lower body assist Assist for lower body dressing: Supervision/Verbal cueing     Toileting Toileting Toileting Activity did not occur Landscape architect and hygiene only): Refused  Toileting assist Assist for toileting: Contact Guard/Touching assist     Transfers Chair/bed transfer  Transfers assist  Chair/bed transfer activity did not occur: N/A  Chair/bed transfer assist level: Supervision/Verbal cueing     Locomotion Ambulation   Ambulation assist   Ambulation activity did not occur: Refused  Assist level: Contact Guard/Touching assist Assistive device: Walker-rolling Max distance: 250ft   Walk 10 feet activity   Assist     Assist level: Supervision/Verbal cueing Assistive device: Walker-rolling   Walk 50 feet activity   Assist Walk 50 feet with 2 turns activity did not occur: Safety/medical concerns  Assist level: Supervision/Verbal cueing Assistive device: Walker-rolling    Walk 150  feet activity   Assist Walk 150 feet activity did not occur: Safety/medical concerns  Assist level: Supervision/Verbal cueing Assistive device: Walker-rolling    Walk 10 feet on uneven surface  activity   Assist  Walk 10 feet on uneven surfaces activity did not occur: (up/down ramp)   Assist level: Minimal Assistance - Patient > 75% Assistive device: Aeronautical engineer Will patient use wheelchair at discharge?: No             Wheelchair 50 feet with 2 turns activity    Assist            Wheelchair 150 feet activity     Assist            Medical Problem List and Plan: 1.Functional and mobility deficitssecondary to presumptive brainstem infarct, RUE weakness and ataxia,diplopia, likely left pontine infarct -CIR PT OT, team conf in am 2. Antithrombotics: -DVT/anticoagulation:Pharmaceutical:Lovenox -antiplatelet therapy: ASA/Plavix X 3 weeks followed by Plavix alone. 3. Pain Management:tylenol prn. Left knee effusion gm stain neg, elevated WBC in synovial fluid, +urate crystals c/w gout avoid NSAIDs and colchicine due to CKD,  expect steroid injection to be effective, culture no growth at 3 days    4. Mood:LCSW to follow for evaluation and support. -antipsychotic agents: N/A 5. Neuropsych: This patientiscapable of making decisions on hisown behalf. 6. Skin/Wound Care:Routine pressure relief measures. Continue unna boots for edema control--change weekly. These need to be re-applied to provide more compression. Contact WOC RN change weekly, tomorrow 7. Fluids/Electrolytes/Nutrition:Monitor I/O.   -I personally reviewed the patient's labs today.   8. T2DM:Will monitor BS achs. Continue 70/30 insulin bid and titrate as indicated.  CBG (last 3)  Recent Labs    10/30/18 1635 10/30/18 2110 10/31/18 0610  GLUCAP 234* 172* 112*  improvement today   70/30 insulin increased 5/22 9.Fabry's disease/ acute on CKD:Monitor with serial checks. Follow up labs reviewed--infusion ordered for 5/29 per Dr. Marval Regal. 10.Diastolic HF/DOE: Monitor weights daily. Albuterol prnSOB.Unna boots  -resume  lasix 11.CHB s/p PPM: Continue to monitor HR bid. Will monitor weights daily as lasix on hold. 12. HTN: Monitor BP bid. On Norvasc 10mg  daily. Lasix resumed Vitals:   10/30/18 1930 10/31/18 0404  BP: (!) 158/81 (!) 151/84  Pulse: 67 (!) 59  Resp: 16 16  Temp: 98 F (36.7 C) 98 F (36.7 C)  SpO2: 94% 100%  sl elevation, given CHF will resume home dose lasix 20mg  daily, monitor on current meds 13. Dyslipidemia:On Lipitor 80 mg daily. 14.  Question COPD using inhalers at home non-smoker Morbid obesity- BMI 42 diet , exercise, some of this weight is fluid related as well 15.  Left knee effusion , symptomatically improved after arthrocentesis, gout, Ortho on consult  LOS: 8 days A FACE TO FACE EVALUATION WAS PERFORMED  [name] 10/31/2018, 9:00 AM

## 2018-10-31 NOTE — Progress Notes (Signed)
Physical Therapy Session Note  Patient Details  Name: [name] MRN: [id number] Date of Birth: 09-Sep-1960  Today's Date: 10/31/2018 PT Individual Time: 1100-1200 PT Individual Time Calculation (min): 60 min   Short Term Goals: Week 1:  PT Short Term Goal 1 (Week 1): Pt will ambulate at least 42ft using RW with no more than CGA PT Short Term Goal 2 (Week 1): Pt will perform bed<>chair transfers with no more than CGA PT Short Term Goal 3 (Week 1): Pt will ascend/descend 4 steps using bilateral handrails with no more than CGA PT Short Term Goal 4 (Week 1): Pt will performed bed mobility independently without using bed features  Skilled Therapeutic Interventions/Progress Updates: Pt presented in bed agreeable to therapy. Pt performed bed mobility mod I with use of bed features. Pt ambulated to rehab gym supervision with RW. Participated in higher level gait balance activities with and without AD. Participated in obstacle course walking over thresholds and on unlevel surfaces to simulate community ambulation with RW x 3 trials. First bout pt noted to have x 1 LOB with L lateral lean which pt was able to self correct. Pt with no additional LOB with additional bouts. Pt then participated in weaving through cones no AD x 3 bouts with x LOB 2/3 trials with x 1 occurrence requiring minA from PTA for correction. Pt also participated in x 2 rounds of horseshoes while standing on compliant surface initially requiring minA fading to CGA with increased time. Pt       Therapy Documentation Precautions:  Precautions Precautions: Fall, Other (comment) Precaution Comments: left lateral LOB; vital sign precautions: maintain HR between 55-120, SBP 100-180, and DBP 60-100 Restrictions Weight Bearing Restrictions: No General:   Vital Signs: Therapy Vitals Temp: 98.9 F (37.2 C) Temp Source: Oral Pulse Rate: 86 Resp: 18 BP: (!) 134/95 Patient Position (if appropriate): Lying Oxygen Therapy SpO2:  (!) 88 % O2 Device: Room Air    Therapy/Group: Individual Therapy  Rama Mcclintock  Bernice Mullin, PTA  10/31/2018, 4:19 PM

## 2018-11-01 ENCOUNTER — Inpatient Hospital Stay (HOSPITAL_COMMUNITY): Payer: Medicare Other | Admitting: Occupational Therapy

## 2018-11-01 ENCOUNTER — Inpatient Hospital Stay (HOSPITAL_COMMUNITY): Payer: Medicare Other

## 2018-11-01 ENCOUNTER — Inpatient Hospital Stay (HOSPITAL_COMMUNITY): Payer: Medicare Other | Admitting: Physical Therapy

## 2018-11-01 LAB — GLUCOSE, CAPILLARY
Glucose-Capillary: 107 mg/dL — ABNORMAL HIGH (ref 70–99)
Glucose-Capillary: 75 mg/dL (ref 70–99)
Glucose-Capillary: 197 mg/dL — ABNORMAL HIGH (ref 70–99)
Glucose-Capillary: 186 mg/dL — ABNORMAL HIGH (ref 70–99)

## 2018-11-01 NOTE — Progress Notes (Signed)
Pt has unna boots on bilat LE. Contacted ortho tech to discuss changing d/t pt DC tomorrow around lunch. Ortho tech stated would order supplies and come change today.   Rosita Fire, RN

## 2018-11-01 NOTE — Patient Care Conference (Signed)
Inpatient RehabilitationTeam Conference and Plan of Care Update Date: 11/01/2018   Time: 10:42 AM    Patient Name: [name]      Medical Record Number: [id number]  Date of Birth: 10/19/1960 Sex: Male         Room/Bed: 4W23C/4W23C-01 Payor Info: Payor: [employer]ICARE / Plan: [employer] MEDICARE / Product Type: *No Product type* /    Admitting Diagnosis: CVA 1 Team  CVA no paresis; 17-19days  Admit Date/Time:  10/23/2018  1:56 PM Admission Comments: No comment available   Primary Diagnosis:  <principal problem not specified> Principal Problem: <principal problem not specified>  Patient Active Problem List   Diagnosis Date Noted  . 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 CVA (cerebral vascular accident) (HCC) 10/20/2018  . UTI (urinary tract infection) 10/19/2018  . Complete heart block (HCC) 08/01/2018  . Upper airway cough syndrome 08/01/2018  . Lymphedema 10/06/2016  . Gout, arthritis   . 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 Onychomycosis of toenail 09/26/2014  . Diabetes mellitus type 2 in obese (HCC)   . Iron deficiency anemia 07/29/2014  . CKD (chronic kidney disease), stage III (HCC)   . Chronic diastolic HF (heart failure) (HCC) 03/27/2013  . Peripheral edema 07/20/2012  . Obstructive sleep apnea, suspected 06/21/2012  . Pacemaker-St.Jude 02/23/2012  . Hypertension 08/09/2011  . COPD with asthma (HCC) 08/09/2011  . Mobitz type II atrioventricular block 05/19/2011  . 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HCC) 05/19/2011    Expected Discharge Date: Expected Discharge Date: 11/02/18  Team Members Present: Physician leading conference: Dr. Andrew Kirsteins Social Worker Present: Becky DuPree, LCSW Nurse Present: Whitney Reardon, RN PT Present: Ben Zaino, PT;Rosita Dechalus, PTA OT Present: Katie Bradsher, OT SLP Present: Happi Overton, SLP PPS Coordinator present : Becky Windsor, PT     Current Status/Progress Goal Weekly Team Focus  Medical   Left knee pain improved, working  on fall risk,  DM controlled, HTN still with some lability   reduce fall risk  d/c planning , achieved mobility goals   Bowel/Bladder   LBM 5/27 cont x2  remain continent  assess toileting need qshift and PRN   Swallow/Nutrition/ Hydration   great ability with regular diet and thin liquids  Independent - goal met - pt discharged from ST services  trials of regular diet with upgrade to regular diet   ADL's   Min A-supervision overall using RW at ambulatory level. BADLs completed at shower level.   mod I overall      Mobility   mod I bed mobility, transfers, mod I gait with intermittent L lean which pt is able to correct  mod I overall  balance, education on energy conservation   Communication             Safety/Cognition/ Behavioral Observations            Pain   No c/o pain in one week  remain pain free  assess pain qshift and PRN   Skin   no skin impairments  remain free of skin impairments  assess skin qshift and PRN      *See Care Plan and progress notes for long and short-term goals.     Barriers to Discharge  Current Status/Progress Possible Resolutions Date Resolved   Physician    Medical stability;Decreased caregiver support     good progress  ready for d/c in am       Nursing                    PT                    OT                  SLP                SW                Discharge Planning/Teaching Needs:  Pt doing well in therapies and reaching goals of mod/i level. Pt pleased with his progress and feels ready to go home tomorrow.      Team Discussion:  Reaching goals of mod/i level and preparing for DC tomorrow. Medically doing well, resuming home dose CHF meds. Change unna boots prior to discharge. BP improving and BS controlled. Needs to work on energy conservation.  Revisions to Treatment Plan:  DC 5/28    Continued Need for Acute Rehabilitation Level of Care: The patient requires daily medical management by a physician with specialized training in  physical medicine and rehabilitation for the following conditions: Daily direction of a multidisciplinary physical rehabilitation program to ensure safe treatment while eliciting the highest outcome that is of practical value to the patient.: Yes Daily medical management of patient stability for increased activity during participation in an intensive rehabilitation regime.: Yes Daily analysis of laboratory values and/or radiology reports with any subsequent need for medication adjustment of medical intervention for : Neurological problems   I attest that I was present, lead the team conference, and concur with the assessment and plan of the team. Teleconference held due to COVID 19   Jerauld Bostwick, Gardiner Rhyme 11/01/2018, 10:42 AM

## 2018-11-01 NOTE — Progress Notes (Signed)
Physical Therapy Discharge Summary  Patient Details  Name: [name] MRN: [id number] Date of Birth: 02/12/1961  Today's Date: 11/01/2018    Patient has met 10 of 10 long term goals due to improved activity tolerance, improved balance, improved postural control, increased strength, improved attention, improved awareness and improved coordination.  Patient to discharge at an ambulatory level Modified Independent.   Patient's care partner is independent to provide the necessary physical and cognitive assistance at discharge.  Reasons goals not met: N/A   Recommendation:  Patient will benefit from ongoing skilled PT services in home health setting to continue to advance safe functional mobility, address ongoing impairments in balance, gait, coordination, safety, and minimize fall risk.  Equipment: Wide Base RW  Reasons for discharge: treatment goals met  Patient/family agrees with progress made and goals achieved: Yes  PT Discharge Precautions/Restrictions Precautions Precautions: Fall Restrictions Weight Bearing Restrictions: No  Pain Pain Assessment Pain Scale: 0-10 Pain Score: 0-No pain Vision/Perception    mild double vision. Improved from eval.  Cognition Overall Cognitive Status: Within Functional Limits for tasks assessed Arousal/Alertness: Awake/alert Orientation Level: Oriented X4 Sensation   WFL Motor  Motor Motor: Within Functional Limits  Mobility Bed Mobility Bed Mobility: Supine to Sit;Sit to Supine Supine to Sit: Independent Sit to Supine: Independent Transfers Transfers: Sit to Stand;Stand to Sit Sit to Stand: Independent with assistive device Stand to Sit: Independent with assistive device Transfer (Assistive device): Rolling walker Locomotion  Gait Ambulation: Yes Gait Assistance: Independent with assistive device Gait Distance (Feet): 150 Feet Assistive device: Rolling walker Gait Gait: Yes Gait Pattern: Impaired Gait Pattern: Wide  base of support;Decreased step length - left;Decreased stance time - right Gait velocity: decreased Stairs / Additional Locomotion Stairs: Yes Stairs Assistance: Independent with assistive device Stair Management Technique: Two rails Number of Stairs: 12 Height of Stairs: 6 Ramp: Independent with assistive device Curb: Independent with assistive device Wheelchair Mobility Wheelchair Mobility: No  Trunk/Postural Assessment  Cervical Assessment Cervical Assessment: Within Functional Limits Thoracic Assessment Thoracic Assessment: Within Functional Limits Lumbar Assessment Lumbar Assessment: Within Functional Limits Postural Control Postural Control: Within Functional Limits  Balance Balance Balance Assessed: Yes Standardized Balance Assessment Standardized Balance Assessment: Berg Balance Test Berg Balance Test Sit to Stand: Able to stand  independently using hands Standing Unsupported: Able to stand safely 2 minutes Sitting with Back Unsupported but Feet Supported on Floor or Stool: Able to sit safely and securely 2 minutes Stand to Sit: Controls descent by using hands Transfers: Able to transfer safely, minor use of hands Standing Unsupported with Eyes Closed: Able to stand 10 seconds safely Standing Ubsupported with Feet Together: Able to place feet together independently and stand 1 minute safely From Standing, Reach Forward with Outstretched Arm: Can reach forward >12 cm safely (5") From Standing Position, Pick up Object from Floor: Able to pick up shoe safely and easily From Standing Position, Turn to Look Behind Over each Shoulder: Looks behind one side only/other side shows less weight shift Turn 360 Degrees: Able to turn 360 degrees safely but slowly Standing Unsupported, Alternately Place Feet on Step/Stool: Able to complete >2 steps/needs minimal assist Standing Unsupported, One Foot in Front: Able to take small step independently and hold 30 seconds Standing on One  Leg: Tries to lift leg/unable to hold 3 seconds but remains standing independently Total Score: 42 Static Sitting Balance Static Sitting - Balance Support: Feet supported Static Sitting - Level of Assistance: 7: Independent Dynamic Sitting Balance Dynamic Sitting - Balance  Support: Feet unsupported Dynamic Sitting - Level of Assistance: 7: Independent Static Standing Balance Static Standing - Balance Support: Bilateral upper extremity supported Static Standing - Level of Assistance: 6: Modified independent (Device/Increase time) Dynamic Standing Balance Dynamic Standing - Balance Support: No upper extremity supported Dynamic Standing - Level of Assistance: 6: Modified independent (Device/Increase time) Extremity Assessment      RLE Assessment RLE Assessment: Within Functional Limits LLE Assessment LLE Assessment: Within Functional Limits    Rosita DeChalus 11/01/2018, 1:00 PM

## 2018-11-01 NOTE — Progress Notes (Signed)
Social Work [name], male   DOB: Oct 09, 1960, 58 y.o.   MRN: [id number] Met with pt to discuss team conference goals mod/i level and discharge tomorrow. Pt feels ready and has been problem solving with his therapy team for home activities/errands. Equipment received and follow up arranged for DC.

## 2018-11-01 NOTE — Progress Notes (Signed)
Physical Therapy Session Note  Patient Details  Name: [name] MRN: [id number] Date of Birth: June 18, 1960  Today's Date: 11/01/2018 PT Individual Time: 1300-1400 PT Individual Time Calculation (min): 60 min   Short Term Goals: Week 1:  PT Short Term Goal 1 (Week 1): Pt will ambulate at least 92ft using RW with no more than CGA PT Short Term Goal 2 (Week 1): Pt will perform bed<>chair transfers with no more than CGA PT Short Term Goal 3 (Week 1): Pt will ascend/descend 4 steps using bilateral handrails with no more than CGA PT Short Term Goal 4 (Week 1): Pt will performed bed mobility independently without using bed features  Skilled Therapeutic Interventions/Progress Updates:     Patient in bed upon PT arrival. Patient alert and agreeable to PT session. Patient completed PT discharge evaluation earlier today and requested to focus this session on home safety and other education.  Therapeutic Activity: Bed Mobility: Patient performed supine to/from sit independently.  Transfers: Patient performed sit to/from stand x6 independently with a RW and with supervision without AD x2.   Gait Training:  Patient ambulated 150 feet x2 using RW independently. Ambulated with decreased gait speed, decreased step length and height, increased hip and knee flexion, and forward trunk lean. Provided verbal cues for erect posture and increased gait speed and step length for reduced fall risk. He ambulated 100 feet x1 without AD with CGA and min A x3 for LOB to the R. Educated on importance of using RW at d/c for safety. Discusses ambulating near a wall for safety due to visual field cut.   Educated patient on checking BP regularly and what normal and abnormal BP are, discussed signs and symptoms of abnormal BP and of a stroke, patient able to recall 5/6 symptoms on stroke. Discussed having someone check in on him daily and to activate emergency services in the event that they cannot get in touch with him.  Patient was able to teach back fall recovery and safety from previous session with min cueing from PT and PT educated on additional home safety measure to take to reduce fall risk.   Patient in bed at end of session with breaks locked and all needs within reach.    Therapy Documentation Precautions:  Precautions Precautions: Fall Precaution Comments: left lateral LOB; vital sign precautions: maintain HR between 55-120, SBP 100-180, and DBP 60-100 Restrictions Weight Bearing Restrictions: No Pain: Pain Assessment Pain Scale: 0-10 Pain Score: 0-No pain    Therapy/Group: Individual Therapy   Kalissa Grays L Honore Wipperfurth PT, DPT  11/01/2018, 4:15 PM

## 2018-11-01 NOTE — Progress Notes (Signed)
[facility] PHYSICAL MEDICINE [facility] REHABILITATION PROGRESS NOTE   Subjective/Complaints:  The patient is troubleshooting with OT about discharge day errands tomorrow   ROS: Patient denies CP, SOB, N/V/D  Objective:   No results found. Recent Labs    10/30/18 0634  WBC 9.5  HGB 15.4  HCT 47.7  PLT 270   Recent Labs    10/30/18 0634  NA 142  K 4.6  CL 108  CO2 25  GLUCOSE 137*  BUN 34*  CREATININE 1.78*  CALCIUM 8.6*    Intake/Output Summary (Last 24 hours) at 11/01/2018 3244 Last data filed at 11/01/2018 0859 Gross per 24 hour  Intake 720 ml  Output 1150 ml  Net -430 ml     Physical Exam: Vital Signs Blood pressure (!) 148/92, pulse 61, temperature 98 F (36.7 C), temperature source Oral, resp. rate 16, height '5\' 10"'$  (1.778 m), weight 133.3 kg, SpO2 100 %.   Constitutional: No distress . Vital signs reviewed. obese HEENT: EOMI, oral membranes moist Neck: supple Cardiovascular: RRR without murmur. No JVD    Respiratory: CTA Bilaterally without wheezes or rales. Normal effort    GI: BS +, non-tender, non-distended  Extremities: No clubbing, cyanosis, or edema Skin: No evidence of breakdown, no evidence of rash Neurologic: Cranial nerves II through XII intact, motor strength is4/5 RUE and  5/5 in LUEdeltoid, bicep, tricep, grip, 5/5 RLE, 4/5 LLE hip flexor, knee extensors, ankle dorsiflexor and plantar flexor--stable exam Right UE dysmetria MUSCl: Reduced left knee flexion and extension, suprapatellar bursa effusion noted, minimal tenderness, no erythema   Assessment/Plan: 1. 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  Physiatrist is providing close team supervision and 24 hour management of active medical problems listed below.  Physiatrist and rehab team continue to assess barriers to discharge/monitor patient progress  toward functional and medical goals  Care Tool:  Bathing  Bathing activity did not occur: Refused Body parts bathed by patient: Right arm, Left arm, Chest, Abdomen, Front perineal area, Right upper leg, Left upper leg, Face, Buttocks   Body parts bathed by helper: Buttocks Body parts n/a: Right lower leg, Left lower leg   Bathing assist Assist Level: Supervision/Verbal cueing     Upper Body Dressing/Undressing Upper body dressing Upper body dressing/undressing activity did not occur (including orthotics): Refused What is the patient wearing?: Hospital gown only    Upper body assist Assist Level: Supervision/Verbal cueing    Lower Body Dressing/Undressing Lower body dressing    Lower body dressing activity did not occur: Refused What is the patient wearing?: Pants     Lower body assist Assist for lower body dressing: Supervision/Verbal cueing     Toileting Toileting Toileting Activity did not occur Landscape architect and hygiene only): Refused  Toileting assist Assist for toileting: Contact Guard/Touching assist     Transfers Chair/bed transfer  Transfers assist  Chair/bed transfer activity did not occur: N/A  Chair/bed transfer assist level: Supervision/Verbal cueing     Locomotion Ambulation   Ambulation assist   Ambulation activity did not occur: Refused  Assist level: Supervision/Verbal cueing Assistive device: Walker-rolling Max distance: 272f   Walk 10 feet activity   Assist     Assist level: Supervision/Verbal cueing Assistive device: Walker-rolling   Walk 50 feet activity   Assist Walk 50 feet with 2 turns activity did not occur: Safety/medical concerns  Assist level: Supervision/Verbal cueing Assistive device: Walker-rolling  Walk 150 feet activity   Assist Walk 150 feet activity did not occur: Safety/medical concerns  Assist level: Supervision/Verbal cueing Assistive device: Walker-rolling    Walk 10 feet on uneven  surface  activity   Assist Walk 10 feet on uneven surfaces activity did not occur: (up/down ramp)   Assist level: Minimal Assistance - Patient > 75% Assistive device: Aeronautical engineer Will patient use wheelchair at discharge?: No             Wheelchair 50 feet with 2 turns activity    Assist            Wheelchair 150 feet activity     Assist            Medical Problem List and Plan: 1.Functional and mobility deficitssecondary to presumptive brainstem infarct, RUE weakness and ataxia,diplopia, likely left pontine infarct -CIR PT OT, Team conference today please see physician documentation under team conference tab, met with team face-to-face to discuss problems,progress, and goals. Formulized individual treatment plan based on medical history, underlying problem and comorbidities. 2. Antithrombotics: -DVT/anticoagulation:Pharmaceutical:Lovenox -antiplatelet therapy: ASA/Plavix X 3 weeks followed by Plavix alone. 3. Pain Management:tylenol prn. Left knee effusion gm stain neg, elevated WBC in synovial fluid, +urate crystals c/w gout avoid NSAIDs and colchicine due to CKD,  expect steroid injection to be effective, culture no growth at 3 days    4. Mood:LCSW to follow for evaluation and support. -antipsychotic agents: N/A 5. Neuropsych: This patientiscapable of making decisions on hisown behalf. 6. Skin/Wound Care:Routine pressure relief measures. Continue unna boots for edema control--change weekly. These need to be re-applied to provide more compression. Contact WOC RN change weekly, tomorrow 7. Fluids/Electrolytes/Nutrition:Monitor I/O.   -I personally reviewed the patient's labs today.   8. T2DM:Will monitor BS achs. Continue 70/30 insulin bid and titrate as indicated.  CBG (last 3)  Recent Labs    10/31/18 1706 10/31/18 2111 11/01/18 0610  GLUCAP 202* 177* 107*   improvement today   70/30 insulin increased 5/27 9.Fabry's disease/ acute on CKD:Monitor with serial checks. Follow up labs reviewed--infusion ordered for 5/29 per Dr. Marval Regal. 10.Diastolic HF/DOE: Monitor weights daily. Albuterol prnSOB.Unna boots, change Unna boots prior to discharge  -resume lasix 11.CHB s/p PPM: Continue to monitor HR bid. Will monitor weights daily as lasix on hold. 12. HTN: Monitor BP bid. On Norvasc '10mg'$  daily. Lasix resumed Vitals:   10/31/18 1926 11/01/18 0502  BP: (!) 169/89 (!) 148/92  Pulse: 62 61  Resp: 18 16  Temp: 98.3 F (36.8 C) 98 F (36.7 C)  SpO2: 97% 100%  lability , given CHF will resume home dose lasix '20mg'$  daily, monitor on current meds 13. Dyslipidemia:On Lipitor 80 mg daily. 14.  Question COPD using inhalers at home non-smoker Morbid obesity- BMI 42 diet , exercise, some of this weight is fluid related as well 15.  Left knee effusion , symptomatically improved after arthrocentesis, gout, Ortho on consult, no recurrence since steroid injection  LOS: 9 days A FACE TO FACE EVALUATION WAS PERFORMED  Charlett Blake 11/01/2018, 9:37 AM

## 2018-11-01 NOTE — Progress Notes (Signed)
Occupational Therapy Discharge Summary  Patient Details  Name: [name] MRN: [id number] Date of Birth: 1961/05/19  Today's Date: 11/01/2018 OT Individual Time: 2518-9842 OT Individual Time Calculation (min): 75 min    Patient has met 13 of 13 long term goals due to improved activity tolerance, improved balance, ability to compensate for deficits, improved awareness and improved coordination.  Patient to discharge at overall Modified Independent level.   Reasons goals not met: all goals met  Recommendation:  Patient will benefit from ongoing skilled OT services in home health setting to continue to advance functional skills in the area of BADL and iADL.  Equipment: TTB  Reasons for discharge: treatment goals met  Patient/family agrees with progress made and goals achieved: Yes   OT Intervention: Upon entering the room, pt seated in wheelchair with no c/o pain and agreeable to OT intervention. Pt requesting to shower and ambulating with RW to obtain all needed items at mod I level. Pt toileting at mod I level for hygiene and clothing management. Pt seated on TTB in shower and able to cover unna boots himself and bathing at seated level mod I. Pt drying and sitting on EOB to don hospital gown without assistance. Pt taking seated rest break before standing at sink for grooming tasks at mod I level. Pt returning to sit in wheelchair and OT continued education regarding fall risk, and urpose of HHOT and goals in preparation of discharge tomorrow. Pt reports feeling ready and no further questions or needs at this time. Call bell and all needed items within reach. Pt at mod I level with use of RW.   OT Discharge Precautions/Restrictions  Precautions Precautions: Fall Restrictions Weight Bearing Restrictions: No Vital Signs Therapy Vitals Temp: 38 F (36.7 C) Temp Source: Oral Pulse Rate: 61 Resp: 16 BP: (!) 148/92 Patient Position (if appropriate): Lying Oxygen Therapy SpO2:  100 % O2 Device: Room Air   Vision Baseline Vision/History: No visual deficits Patient Visual Report: Diplopia Depth Perception: Undershoots Additional Comments: pt reports continues diplopia but when wearing glasses with R eye taped he is able to functionally engage in tasks with no diplopia reported   Cognition Overall Cognitive Status: Within Functional Limits for tasks assessed Arousal/Alertness: Awake/alert Orientation Level: Oriented X4 Sensation Sensation Light Touch: Appears Intact Proprioception: Appears Intact Coordination Gross Motor Movements are Fluid and Coordinated: Yes Fine Motor Movements are Fluid and Coordinated: Yes Mobility  Bed Mobility Bed Mobility: Supine to Sit;Sit to Supine Supine to Sit: Independent Sit to Supine: Independent Transfers Sit to Stand: Independent with assistive device Stand to Sit: Independent with assistive device  Trunk/Postural Assessment  Cervical Assessment Cervical Assessment: Within Functional Limits Thoracic Assessment Thoracic Assessment: Within Functional Limits Lumbar Assessment Lumbar Assessment: Within Functional Limits  Balance Balance Balance Assessed: Yes Static Sitting Balance Static Sitting - Balance Support: Feet supported Static Sitting - Level of Assistance: 7: Independent Dynamic Sitting Balance Dynamic Sitting - Balance Support: During functional activity;Feet supported Dynamic Sitting - Level of Assistance: 7: Independent Static Standing Balance Static Standing - Balance Support: During functional activity;Bilateral upper extremity supported Static Standing - Level of Assistance: 6: Modified independent (Device/Increase time) Dynamic Standing Balance Dynamic Standing - Balance Support: During functional activity;Bilateral upper extremity supported Dynamic Standing - Level of Assistance: 6: Modified independent (Device/Increase time) Extremity/Trunk Assessment RUE Assessment RUE Assessment: Within  Functional Limits LUE Assessment LUE Assessment: Within Functional Limits   Gypsy Decant 11/01/2018, 7:58 AM

## 2018-11-01 NOTE — Progress Notes (Signed)
Orthopedic Tech Progress Note Patient Details:  [name] 02/02/1961 [id number]  Ortho Devices Type of Ortho Device: Louretta Parma boot Ortho Device/Splint Location: Bilateral Winfred Leeds Ortho Device/Splint Interventions: Application   Post Interventions Patient Tolerated: Well Instructions Provided: Care of device   [place] Pink 11/01/2018, 3:30 PM

## 2018-11-01 NOTE — Progress Notes (Signed)
Physical Therapy Session Note  Patient Details  Name: [name] MRN: [id number] Date of Birth: March 29, 1961  Today's Date: 11/01/2018 PT Individual Time: 1100-1155 PT Individual Time Calculation (min): 55 min   Short Term Goals: Week 1:  PT Short Term Goal 1 (Week 1): Pt will ambulate at least 17f using RW with no more than CGA PT Short Term Goal 2 (Week 1): Pt will perform bed<>chair transfers with no more than CGA PT Short Term Goal 3 (Week 1): Pt will ascend/descend 4 steps using bilateral handrails with no more than CGA PT Short Term Goal 4 (Week 1): Pt will performed bed mobility independently without using bed features  Skilled Therapeutic Interventions/Progress Updates: Pt presented in bed agreeable to therapy. Pt performed bed mobility modI and transferred to w/c for energy conservation. Pt transported to ortho gym to participate in care tool activities in preparation for d/c. Pt performed car transfer, gait on uneven surface and ramp mod I with RW. Pt then ambulated to rehab gym and [facility] stair [facility] Pt was able to improve score from 32/56 to 42/56 with improvement in dynamic balance and improved righting reactions. Pt continues to be a fall risk and discussed finding with pt. Also discussed at length with pt energy conservation particularly with d/c. PTA also discussed with pt modifications that could be considered upon d/c with things such as obtaining medications or obtaining mail. Pt verbalized understanding. Pt ambulated back to room mod I and left sitting at EOB with current needs met and nsg notified of pt's updated status.      Therapy Documentation Precautions:  Precautions Precautions: Fall Precaution Comments: left lateral LOB; vital sign precautions: maintain HR between 55-120, SBP 100-180, and DBP 60-100 Restrictions Weight Bearing Restrictions: No General:   Vital Signs:  Pain: Pain Assessment Pain Scale:  0-10 Pain Score: 0-No pain Mobility:   Locomotion :    Trunk/Postural Assessment : Cervical Assessment Cervical Assessment: Within Functional Limits Thoracic Assessment Thoracic Assessment: Within Functional Limits Lumbar Assessment Lumbar Assessment: Within Functional Limits Postural Control Postural Control: Within Functional Limits  Balance: Balance Balance Assessed: Yes Standardized Balance Assessment Standardized Balance Assessment: Berg Balance Test Berg Balance Test Sit to Stand: Able to stand  independently using hands Standing Unsupported: Able to stand safely 2 minutes Sitting with Back Unsupported but Feet Supported on Floor or Stool: Able to sit safely and securely 2 minutes Stand to Sit: Controls descent by using hands Transfers: Able to transfer safely, minor use of hands Standing Unsupported with Eyes Closed: Able to stand 10 seconds safely Standing Ubsupported with Feet Together: Able to place feet together independently and stand 1 minute safely From Standing, Reach Forward with Outstretched Arm: Can reach forward >12 cm safely (5") From Standing Position, Pick up Object from Floor: Able to pick up shoe safely and easily From Standing Position, Turn to Look Behind Over each Shoulder: Looks behind one side only/other side shows less weight shift Turn 360 Degrees: Able to turn 360 degrees safely but slowly Standing Unsupported, Alternately Place Feet on Step/Stool: Able to complete >2 steps/needs minimal assist Standing Unsupported, One Foot in Front: Able to take small step independently and hold 30 seconds Standing on One Leg: Tries to lift leg/unable to hold 3 seconds but remains standing independently Total Score: 42 Static Sitting Balance Static Sitting - Balance Support: Feet supported Static Sitting - Level of Assistance: 7: Independent Dynamic Sitting Balance Dynamic Sitting - Balance Support: Feet unsupported  Dynamic Sitting - Level of Assistance: 7:  Independent Static Standing Balance Static Standing - Balance Support: Bilateral upper extremity supported Static Standing - Level of Assistance: 6: Modified independent (Device/Increase time) Dynamic Standing Balance Dynamic Standing - Balance Support: No upper extremity supported Dynamic Standing - Level of Assistance: 6: Modified independent (Device/Increase time)    Therapy/Group: Individual Therapy  Dory Verdun  Rogerio Boutelle, PTA  11/01/2018, 12:49 PM

## 2018-11-02 ENCOUNTER — Other Ambulatory Visit: Payer: Self-pay | Admitting: Internal Medicine

## 2018-11-02 DIAGNOSIS — I69398 Other sequelae of cerebral infarction: Secondary | ICD-10-CM

## 2018-11-02 DIAGNOSIS — R269 Unspecified abnormalities of gait and mobility: Secondary | ICD-10-CM

## 2018-11-02 DIAGNOSIS — I10 Essential (primary) hypertension: Secondary | ICD-10-CM

## 2018-11-02 LAB — GLUCOSE, CAPILLARY: Glucose-Capillary: 151 mg/dL — ABNORMAL HIGH (ref 70–99)

## 2018-11-02 MED ORDER — FUROSEMIDE 20 MG PO TABS: 20.0000 mg | ORAL_TABLET | Freq: Every day | ORAL | 1 refills | Status: DC

## 2018-11-02 MED ORDER — CLOPIDOGREL BISULFATE 75 MG PO TABS: 75.0000 mg | ORAL_TABLET | Freq: Every day | ORAL | 1 refills | Status: DC

## 2018-11-02 MED ORDER — AMLODIPINE BESYLATE 10 MG PO TABS: 10.0000 mg | ORAL_TABLET | Freq: Every day | ORAL | 0 refills | Status: DC

## 2018-11-02 MED ORDER — ATORVASTATIN CALCIUM 80 MG PO TABS: 80.0000 mg | ORAL_TABLET | Freq: Every day | ORAL | 1 refills | Status: DC

## 2018-11-02 MED ORDER — ASPIRIN 81 MG PO TABS: 81.0000 mg | ORAL_TABLET | Freq: Every day | ORAL | 0 refills | Status: DC

## 2018-11-02 MED ORDER — INSULIN LISPRO PROT & LISPRO (75-25 MIX) 100 UNIT/ML KWIKPEN: PEN_INJECTOR | SUBCUTANEOUS | 2 refills | Status: DC

## 2018-11-02 NOTE — Discharge Summary (Signed)
Physician Discharge Summary  [name] MRN: [id number] DOB/AGE: Aug 03, 1960 58 y.o.  Admit date: 10/23/2018 Discharge date: 11/02/2018  Discharge Diagnoses:  Principal Problem:   Brain stem stroke syndrome Active Problems:   Hypertension   COPD with asthma ([place])   Peripheral edema   CKD (chronic kidney disease), stage III [facility])   Discharged Condition: stable   Significant Diagnostic Studies: N/A   Labs:  Basic Metabolic Panel: BMP Latest Ref Rng & Units 10/30/2018 10/24/2018 10/22/2018  Glucose 70 - 99 mg/dL 137(H) 99 97  BUN 6 - 20 mg/dL 34(H) 29(H) 29(H)  Creatinine 0.61 - 1.24 mg/dL 1.78(H) 1.87(H) 2.05(H)  BUN/Creat Ratio 9 - 20 - - -  Sodium 135 - 145 mmol/L 142 142 142  Potassium 3.5 - 5.1 mmol/L 4.6 4.2 3.6  Chloride 98 - 111 mmol/L 108 107 108  CO2 22 - 32 mmol/L 25 22 26   Calcium 8.9 - 10.3 mg/dL 8.6(L) 9.0 8.7(L)    CBC: CBC Latest Ref Rng & Units 10/30/2018 10/24/2018 10/19/2018  WBC 4.0 - 10.5 K/uL 9.5 10.0 7.2  Hemoglobin 13.0 - 17.0 g/dL 15.4 14.3 15.9  Hematocrit 39.0 - 52.0 % 47.7 43.1 48.9  Platelets 150 - 400 K/uL 270 175 229    CBG: Recent Labs  Lab 11/01/18 0610 11/01/18 1200 11/01/18 1637 11/01/18 2136 11/02/18 0640  GLUCAP 107* 75 197* 186* 151*    Brief HPI:   [name] is a 58 year old male with history of Fabry disease, COPD, T2DM, HTN, CHD s/p PPM who was admitted on 10/19/18 with difficulty walking, HA and left facial paresthesias. Work up revealed staph UTI and CT head negative. Exam revealed ataxia with nystagmus and diplopia. Neurology felt that stroke was due to small vessel disease and recommended DAPT X 3 weeks followed by Plavix alone. He was found to have staph UTI and treated with ceftriaxone.  Fabrazyme held in setting of acute illness and to resume in 2 weeks per nephrology input.  Therapy was ongoing and patient was noted to be limited by ataxia with diplopia and balance deficits.  CIR was recommended to  functional decline.   Hospital Course: [name] was admitted to rehab 10/23/2018 for inpatient therapies to consist of PT and OT at least three hours five days a week. Past admission physiatrist, therapy team and rehab RN have worked together to provide customized collaborative inpatient rehab.  Diabetes has been monitored AC at bedtime basis and insulin has been titrated for better control.  Blood pressures were monitored on 3 times daily basis and Lasix was resumed for better BP control.  Weights have been reasonably stable.  He reported left knee pain with activity and x-rays done revealing joint effusion in suprapatellar bursa.  Ortho was consulted for input and joint aspirated with fluid culture sent for cultures.  Knee was injected with cortisone and pain has resolved.  Gram stain showed leukocytosis that was felt to be reactive in nature as was negative for bacteria.  Synovial fluid cultures showed no growth.  Repeat BMET showed improvement in serum creatinine.  CBC showed H&H and platelets to be stable.  He was maintained on aspirin and Plavix during his stay and is to discontinue aspirin one week after discharge.  His p.o. intake is good and he is continent of bowel and bladder Unna boots were reapplied for better compression and was changed again prior to discharge.  He has made good progress during his rehab stay and  is currently modified independent. He will continue to receive further follow-up home health PT and OT by advanced home care past discharge   Rehab course: During patient's stay in rehab weekly team conferences were held to monitor patient's progress, set goals and discuss barriers to discharge. At admission, patient required mod assist with basic self-care task and with mobility. He  has had improvement in activity tolerance, balance, postural control as well as ability to compensate for deficits. He has had improvement in functional use RUE  and RLE as well as improvement in  awareness. He is able to complete ADL tasks at modified independent level. He is modified independent for transfers and is able to ambulate 150 feet with rolling walker.  No family education needed and patient has a friend who will check in on him after discharge.   Disposition: Home  Diet: Heart healthy/diabetic  Special Instructions: 1.  Monitor blood sugars ACHS 2.  No driving or strenuous activity till cleared by MD. 3. Change unna boots weekly.   Allergies as of 11/02/2018      Reactions   Lisinopril Anaphylaxis   Shellfish Allergy Hives, Swelling      Medication List    STOP taking these medications   acetaminophen 500 MG tablet Commonly known as:  TYLENOL   hydrocortisone valerate cream 0.2 % Commonly known as:  WESTCORT   losartan-hydrochlorothiazide 50-12.5 MG tablet Commonly known as:  HYZAAR   OLIVE LEAF PO     TAKE these medications   Accu-Chek Aviva Plus test strip Generic drug:  glucose blood TEST 3 TIMES DAILY. ICD 10 E11.8   Accu-Chek FastClix Lancets Misc Use to test blood sugar 3 to 4 times daily.   albuterol 108 (90 Base) MCG/ACT inhaler Commonly known as:  ProAir HFA Inhale 1 puff into the lungs every 6 (six) hours as needed for wheezing or shortness of breath.   allopurinol 100 MG tablet Commonly known as:  ZYLOPRIM Take 3 tablets (300 mg total) by mouth daily.   amLODipine 10 MG tablet Commonly known as:  NORVASC Take 1 tablet (10 mg total) by mouth daily.   aspirin 81 MG tablet Take 1 tablet (81 mg total) by mouth daily. For one more week then stop What changed:  additional instructions   atorvastatin 80 MG tablet Commonly known as:  LIPITOR Take 1 tablet (80 mg total) by mouth daily at 6 PM.   clopidogrel 75 MG tablet Commonly known as:  PLAVIX Take 1 tablet (75 mg total) by mouth daily.   FABRAZYME IV Inject 115 mg into the vein every 14 (fourteen) days.   fexofenadine 180 MG tablet Commonly known as:  ALLEGRA Take 1 tablet  (180 mg total) by mouth daily.   fluticasone 50 MCG/ACT nasal spray Commonly known as:  FLONASE Place 1 spray into both nostrils daily. What changed:    when to take this  reasons to take this   furosemide 20 MG tablet Commonly known as:  LASIX Take 1 tablet (20 mg total) by mouth daily. Start taking on:  Nov 03, 2018   Insulin Lispro Prot & Lispro (75-25) 100 UNIT/ML Kwikpen Commonly known as:  HUMALOG 75/25 MIX INJECT 20 UNITS INTO THE SKIN 2 TIMES DAILY What changed:    how much to take  how to take this  when to take this   Insulin Pen Needle 31G X 8 MM Misc Commonly known as:  PX Shortlength Pen Needles 1 each by Does not apply route 2 (  two) times daily before a meal.        Signed: Bary Leriche 11/02/2018, 10:45 AM

## 2018-11-02 NOTE — Progress Notes (Signed)
Patient escorted to car by nursing staff. No questions or concerns expressed at this time. Belongings packed and brought down to car.

## 2018-11-02 NOTE — Progress Notes (Signed)
PHYSICAL MEDICINE & REHABILITATION PROGRESS NOTE   Subjective/Complaints:  Excited about going home   ROS: Patient denies CP, SOB, N/V/D  Objective:   No results found. No results for input(s): WBC, HGB, HCT, PLT in the last 72 hours. No results for input(s): NA, K, CL, CO2, GLUCOSE, BUN, CREATININE, CALCIUM in the last 72 hours.  Intake/Output Summary (Last 24 hours) at 11/02/2018 0837 Last data filed at 11/02/2018 0748 Gross per 24 hour  Intake 960 ml  Output 1375 ml  Net -415 ml     Physical Exam: Vital Signs Blood pressure (!) 157/79, pulse 64, temperature 99.3 F (37.4 C), temperature source Oral, resp. rate 18, height 5\' 10"  (1.778 m), weight 129.4 kg, SpO2 100 %.   Constitutional: No distress . Vital signs reviewed. obese HEENT: EOMI, oral membranes moist Neck: supple Cardiovascular: RRR without murmur. No JVD    Respiratory: CTA Bilaterally without wheezes or rales. Normal effort    GI: BS +, non-tender, non-distended  Extremities: No clubbing, cyanosis, or edema Skin: No evidence of breakdown, no evidence of rash Neurologic: Cranial nerves II through XII intact, motor strength is4/5 RUE and  5/5 in LUEdeltoid, bicep, tricep, grip, 5/5 RLE, 4/5 LLE hip flexor, knee extensors, ankle dorsiflexor and plantar flexor--stable exam Right UE dysmetria MUSCl: Reduced left knee flexion and extension, suprapatellar bursa effusion noted, minimal tenderness, no erythema   Assessment/Plan: 1. 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  Stable for D/C today F/u PCP in 3-4 weeks F/u PM&R 2 weeks See D/C summary See D/C instructions Care Tool:  Bathing  Bathing activity did not occur: Refused Body parts bathed by patient: Right arm, Left arm, Chest, Abdomen, Front perineal area, Right upper leg, Left upper leg, Face, Buttocks   Body parts bathed by helper: Buttocks Body parts n/a: Right lower leg, Left lower leg   Bathing  assist Assist Level: Independent with assistive device     Upper Body Dressing/Undressing Upper body dressing Upper body dressing/undressing activity did not occur (including orthotics): Refused What is the patient wearing?: Hospital gown only    Upper body assist Assist Level: Independent with assistive device    Lower Body Dressing/Undressing Lower body dressing    Lower body dressing activity did not occur: Refused What is the patient wearing?: Hospital gown only     Lower body assist Assist for lower body dressing: Independent with assitive device     Toileting Toileting Toileting Activity did not occur (Clothing management and hygiene only): Refused  Toileting assist Assist for toileting: Independent with assistive device     Transfers Chair/bed transfer  Transfers assist  Chair/bed transfer activity did not occur: Safety/medical concerns  Chair/bed transfer assist level: Independent with assistive device     Locomotion Ambulation   Ambulation assist   Ambulation activity did not occur: Refused  Assist level: Independent with assistive device Assistive device: Walker-rolling Max distance: 150'   Walk 10 feet activity   Assist     Assist level: Independent with assistive device Assistive device: Walker-rolling   Walk 50 feet activity   Assist Walk 50 feet with 2 turns activity did not occur: Safety/medical concerns  Assist level: Independent with assistive device Assistive device: Walker-rolling    Walk 150 feet activity   Assist Walk 150 feet activity did not occur: Safety/medical concerns  Assist level: Independent with assistive device Assistive device: Walker-rolling    Walk 10 feet on uneven surface  activity   Assist Walk  10 feet on uneven surfaces activity did not occur: (up/down ramp)   Assist level: Independent with assistive device Assistive device: Aeronautical engineer Will patient use wheelchair  at discharge?: No             Wheelchair 50 feet with 2 turns activity    Assist            Wheelchair 150 feet activity     Assist            Medical Problem List and Plan: 1.Functional and mobility deficitssecondary to presumptive brainstem infarct, RUE weakness and ataxia,diplopia, likely left pontine infarct -CIR PT OT, plan d/c today  2. Antithrombotics: -DVT/anticoagulation:Pharmaceutical:Lovenox -antiplatelet therapy: ASA/Plavix X 3 weeks followed by Plavix alone. 3. Pain Management:tylenol prn. Left knee effusion gm stain neg, elevated WBC in synovial fluid, +urate crystals c/w gout avoid NSAIDs and colchicine due to CKD,  expect steroid injection to be effective, culture no growth at 3 days    4. Mood:LCSW to follow for evaluation and support. -antipsychotic agents: N/A 5. Neuropsych: This patientiscapable of making decisions on hisown behalf. 6. Skin/Wound Care:Routine pressure relief measures. Continue unna boots for edema control--change weekly. These need to be re-applied to provide more compression. Contact WOC RN change weekly, tomorrow 7. Fluids/Electrolytes/Nutrition:Monitor I/O.   -I personally reviewed the patient's labs today.   8. T2DM:Will monitor BS achs. Continue 70/30 insulin bid and titrate as indicated.  CBG (last 3)  Recent Labs    11/01/18 1637 11/01/18 2136 11/02/18 0640  GLUCAP 197* 186* 151*  fair control 5/28 f/u PCP    70/30 insulin increased 5/27 9.Fabry's disease/ acute on CKD:Monitor with serial checks. Follow up labs reviewed--infusion ordered for 5/29 per Dr. Marval Regal. 10.Diastolic HF/DOE: Monitor weights daily. Albuterol prnSOB.Unna boots, change Unna boots prior to discharge  -resume lasix 11.CHB s/p PPM: Continue to monitor HR bid. Will monitor weights daily as lasix on hold. 12. HTN: Monitor BP bid. On Norvasc 10mg  daily. Lasix resumed Vitals:    11/01/18 1927 11/02/18 0522  BP: 140/84 (!) 157/79  Pulse: 67 64  Resp: 16 18  Temp: 98.8 F (37.1 C) 99.3 F (37.4 C)  SpO2: 98% 100%  lability , but generally improved on Lasix  13. Dyslipidemia:On Lipitor 80 mg daily. 14.  Question COPD using inhalers at home non-smoker Morbid obesity- BMI 42 diet , exercise, some of this weight is fluid related as well 15.  Left knee effusion , symptomatically improved after arthrocentesis, gout, Ortho on consult, no recurrence since steroid injection  LOS: 10 days A FACE TO [place] 11/02/2018, 8:37 AM

## 2018-11-02 NOTE — Discharge Instructions (Signed)
Inpatient Rehab Discharge Instructions   [name] PETTIJOHN Discharge date and time: 11/02/18   Activities/Precautions/ Functional Status: Activity: no lifting, driving, or strenuous exercise till cleared by MD Diet: cardiac diet and low fat, low cholesterol diet Wound Care: none needed    Functional status:  ___ No restrictions     ___ Walk up steps independently ___ 24/7 supervision/assistance   ___ Walk up steps with assistance _X__ Intermittent supervision/assistance  __X_ Bathe/dress independently ___ Walk with walker     ___ Bathe/dress with assistance ___ Walk Independently    ___ Shower independently ___ Walk with assistance    ___ Shower with assistance _X__ No alcohol     ___ Return to work/school ________   Special Instructions: 1. Weight yourself today and daily. 2. Check blood sugars before meals and bedtime/document and take to your PCP office.    COMMUNITY REFERRALS UPON DISCHARGE:    Home Health:   [facility]   Date of last service:11/02/2018  Medical Equipment/Items Ordered:TUB BENCH  Agency/Supplier:[facility]  [phone]     STROKE/TIA DISCHARGE INSTRUCTIONS SMOKING Cigarette smoking nearly doubles your risk of having a stroke & is the single most alterable risk factor  If you smoke or have smoked in the last 12 months, you are advised to quit smoking for your health.  Most of the excess cardiovascular risk related to smoking disappears within a year of stopping.  Ask you doctor about anti-smoking medications  [place] Quit Line: 1-800-QUIT NOW  Free Smoking Cessation Classes [phone]  CHOLESTEROL Know your levels; limit fat & cholesterol in your diet  Lipid Panel     Component Value Date/Time   CHOL 232 (H) 10/20/2018 0324   CHOL 280 (H) 10/06/2016 1038   TRIG 89 10/20/2018 0324   HDL 53 10/20/2018 0324   HDL 59 10/06/2016 1038   CHOLHDL 4.4 10/20/2018 0324   VLDL 18 10/20/2018 0324   LDLCALC  161 (H) 10/20/2018 0324   LDLCALC 201 (H) 10/06/2016 1038      Many patients benefit from treatment even if their cholesterol is at goal.  Goal: Total Cholesterol (CHOL) less than 160  Goal:  Triglycerides (TRIG) less than 150  Goal:  HDL greater than 40  Goal:  LDL (LDLCALC) less than 100   BLOOD PRESSURE American Stroke Association blood pressure target is less that 120/80 mm/Hg  Your discharge blood pressure is:  BP: (!) 142/81  Monitor your blood pressure  Limit your salt and alcohol intake  Many individuals will require more than one medication for high blood pressure  DIABETES (A1c is a blood sugar average for last 3 months) Goal HGBA1c is under 7% (HBGA1c is blood sugar average for last 3 months)  Diabetes:     Lab Results  Component Value Date   HGBA1C 7.4 (H) 10/20/2018     Your HGBA1c can be lowered with medications, healthy diet, and exercise.  Check your blood sugar as directed by your physician  Call your physician if you experience unexplained or low blood sugars.  PHYSICAL ACTIVITY/REHABILITATION Goal is 30 minutes at least 4 days per week  Activity: No driving, Therapies: see above Return to work: N/A  Activity decreases your risk of heart attack and stroke and makes your heart stronger.  It helps control your weight and blood pressure; helps you relax and can improve your mood.  Participate in a regular exercise program.  Talk with your doctor about the  best form of exercise for you (dancing, walking, swimming, cycling).  DIET/WEIGHT Goal is to maintain a healthy weight  Your discharge diet is:  Diet Order            Diet regular Room service appropriate? Yes; Fluid consistency: Thin  Diet effective 1000              liquids Your height is:  Height: 5\' 10"  (177.8 cm) Your current weight is: Weight: 129.4 kg Your Body Mass Index (BMI) is:  BMI (Calculated): 40.93  Following the type of diet specifically designed for you will help prevent  another stroke. lbs  Your goal weight is: 1  Your goal Body Mass Index (BMI) is 19-24.  Healthy food habits can help reduce 3 risk factors for stroke:  High cholesterol, hypertension, and excess weight.  RESOURCES Stroke/Support Group:  Call [phone]   STROKE EDUCATION PROVIDED/REVIEWED AND GIVEN TO PATIENT Stroke warning signs and symptoms How to activate emergency medical system (call 911). Medications prescribed at discharge. Need for follow-up after discharge. Personal risk factors for stroke. Pneumonia vaccine given:  Flu vaccine given:  My questions have been answered, the writing is legible, and I understand these instructions.  I will adhere to these goals & educational materials that have been provided to me after my discharge from the hospital.     My questions have been answered and I understand these instructions. I will adhere to these goals and the provided educational materials after my discharge from the hospital.  Patient/Caregiver Signature _______________________________ Date __________  Clinician Signature _______________________________________ Date __________  Please bring this form and your medication list with you to all your follow-up doctor's appointments.

## 2018-11-02 NOTE — Progress Notes (Signed)
Social Work  Discharge Note  The overall goal for the admission was met for:   Discharge location: [facility]  Length of Stay: Yes-10 DAYS  Discharge activity level: Yes-MOD/I LEVEL  Home/community participation: Yes  Services provided included: MD, RD, PT, OT, SLP, RN, CM, Pharmacy and SW  Financial Services: Private Insurance: [facility]  Follow-up services arranged: Home Health: [place], DME: [facility] and Patient/Family has no preference for HH/DME agencies  Comments (or additional information):PT NEEDED TO BE MOD/I TO GO HOME ALONE SAFELY, ONLY HAS A FRIEND TO CHECK IN ON HIM. HE DID WELL HERE AND REACHED THESE GOALS. CONCERN IS HE WILL TRY TO DO TOO MUCH WHEN GOES HOME AND WILL BE AT RISK TO FALL.  Patient/Family verbalized understanding of follow-up arrangements: Yes  Individual responsible for coordination of the follow-up plan: SELF & CAROL-FRIEND  Confirmed correct DME delivered: Elease Hashimoto 11/02/2018    Elease Hashimoto

## 2018-11-03 ENCOUNTER — Other Ambulatory Visit: Payer: Self-pay | Admitting: Internal Medicine

## 2018-11-03 ENCOUNTER — Ambulatory Visit (HOSPITAL_COMMUNITY): Payer: Medicare Other

## 2018-11-03 ENCOUNTER — Encounter (HOSPITAL_COMMUNITY): Payer: Medicare Other

## 2018-11-03 DIAGNOSIS — I10 Essential (primary) hypertension: Secondary | ICD-10-CM

## 2018-11-06 ENCOUNTER — Encounter: Payer: Self-pay | Admitting: Cardiology

## 2018-11-06 ENCOUNTER — Telehealth: Payer: Self-pay | Admitting: Registered Nurse

## 2018-11-06 ENCOUNTER — Telehealth: Payer: Self-pay

## 2018-11-06 NOTE — Telephone Encounter (Signed)
Erlene Quan, OT/[facility] called requesting verbal orders. I called Erlene Quan back and he states [name] is refusing OT so he will just be an evaluation.Orders approved and given

## 2018-11-06 NOTE — Telephone Encounter (Signed)
Placed a call to [name], his phone rings and call drops. Placed a call to his sister awaiting a return call. Sent e-mail to [facility] to see if she has another number for [name]. Awaiting a response.

## 2018-11-08 ENCOUNTER — Telehealth: Payer: Self-pay | Admitting: Adult Health

## 2018-11-08 NOTE — Telephone Encounter (Signed)
°  Due to current COVID 19 pandemic, our office is severely reducing in office visits until further notice, in order to minimize the risk to our patients and healthcare providers.    Called patient and scheduled a virtual visit with [name]  for 12/04/2018 . Patient verbalized understanding of the [email or web address] process and I have sent an e-mail to [email or web address]  with link and instructions as well as my name and our office number. Patient understands that they will receive a call from RN to update chart.   Pt understands that although there may be some limitations with this type of visit, we will take all precautions to reduce any security or privacy concerns.  Pt understands that this will be treated like an in office visit and we will file with pt's insurance, and there may be a patient responsible charge related to this service.

## 2018-11-14 ENCOUNTER — Other Ambulatory Visit: Payer: Self-pay

## 2018-11-14 ENCOUNTER — Encounter: Payer: Self-pay | Admitting: Physical Medicine & Rehabilitation

## 2018-11-14 ENCOUNTER — Encounter: Payer: Medicare Other | Attending: Physical Medicine & Rehabilitation | Admitting: Physical Medicine & Rehabilitation

## 2018-11-14 DIAGNOSIS — I890 Lymphedema, not elsewhere classified: Secondary | ICD-10-CM | POA: Diagnosis not present

## 2018-11-14 NOTE — Progress Notes (Signed)
[name], male   DOB: [birth year], 58 y.o.   MRN: [id number]     [name], is a 58 y.o. male  JAS:[id number]  ALP:[id number]  DOB - [birth year]  Subjective:  Chief Complaint and HPI: [name] is a 58 y.o. male here today for a follow up visit After hospitalization 5/18-5/28/2020.  No fever.  No cough.  Denies any new complaints today.   His weight was 285 11/02/2018.  He is feeling good.  He is back to ADL from prior to hospitalization.  He was having home PT/OT and told them not to come any more bc "they weren't doing anything for me."    He is picking up his plavix today.  He has still been taking aspirin.    He has not had his unna boots changed yet and needs referral for that.  No further s/sx of stroke.  No CP.  Sees neurology 6/29.   Knee has improved since draining and injection.  No urinary s/sx.  Bowels and bladder WNL.    He resumes fabryzyme tomorrow.    From discharge summary: Brief HPI:   [name] is a 58 year old male with history of Fabry disease, COPD, T2DM, HTN, CHD s/p PPM who was admitted on 10/19/18 with difficulty walking, HA and left facial paresthesias. Work up revealed staph UTI and CT head negative. Exam revealed ataxia with nystagmus and diplopia. Neurology felt that stroke was due to small vessel disease and recommended DAPT X 3 weeks followed by Plavix alone. He was found to have staph UTI and treated with ceftriaxone.  Fabrazyme held in setting of acute illness and to resume in 2 weeks per nephrology input.  Therapy was ongoing and patient was noted to be limited by ataxia with diplopia and balance deficits.  CIR was recommended to functional decline.   Hospital Course: [name] was admitted to rehab 10/23/2018 for inpatient therapies to consist of PT and OT at least three hours five days a week. Past admission physiatrist, therapy team and rehab RN have worked together to provide customized collaborative  inpatient rehab.  Diabetes has been monitored AC at bedtime basis and insulin has been titrated for better control.  Blood pressures were monitored on 3 times daily basis and Lasix was resumed for better BP control.  Weights have been reasonably stable.  He reported left knee pain with activity and x-rays done revealing joint effusion in suprapatellar bursa.  Ortho was consulted for input and joint aspirated with fluid culture sent for cultures.  Knee was injected with cortisone and pain has resolved.  Gram stain showed leukocytosis that was felt to be reactive in nature as was negative for bacteria.  Synovial fluid cultures showed no growth.  Repeat BMET showed improvement in serum creatinine.  CBC showed H&H and platelets to be stable.  He was maintained on aspirin and Plavix during his stay and is to discontinue aspirin one week after discharge.  His p.o. intake is good and he is continent of bowel and bladder Unna boots were reapplied for better compression and was changed again prior to discharge.  He has made good progress during his rehab stay and is currently modified independent. He will continue to receive further follow-up home health PT and OT by advanced home care past discharge   Rehab course: During patient's stay in rehab weekly team conferences were held to monitor patient's progress, set goals and discuss barriers to discharge. At admission, patient required  mod assist with basic self-care task and with mobility. He  has had improvement in activity tolerance, balance, postural control as well as ability to compensate for deficits. He has had improvement in functional use RUE  and RLE as well as improvement in awareness. He is able to complete ADL tasks at modified independent level. He is modified independent for transfers and is able to ambulate 150 feet with rolling walker.  No family education needed and patient has a friend who will check in on him after discharge.   Disposition:  Home  Diet: Heart healthy/diabetic  Special Instructions: 1.  Monitor blood sugars ACHS 2.  No driving or strenuous activity till cleared by MD. 3. Change unna boots weekly.  ED/Hospital notes reviewed and summarized above  ROS:   Constitutional:  No f/c, No night sweats, No unexplained weight loss. EENT:  No vision changes, No blurry vision, No hearing changes. No mouth, throat, or ear problems.  Respiratory: No cough, No SOB Cardiac: No CP, no palpitations GI:  No abd pain, No N/V/D. GU: No Urinary s/sx Musculoskeletal: No joint pain Neuro: No headache, no dizziness, no motor weakness.  Skin: No rash Endocrine:  No polydipsia. No polyuria.  Psych: Denies SI/HI  No problems updated.  ALLERGIES: Allergies  Allergen Reactions  . Lisinopril Anaphylaxis  . Shellfish Allergy Hives and Swelling    PAST MEDICAL HISTORY: Past Medical History:  Diagnosis Date  . Bell's palsy   . Bifascicular block   . Chronic bronchitis ([place])    "seasonal; get it q yr"  . Chronic diastolic CHF (congestive heart failure) ([place]) 2010  . Chronic kidney disease (CKD), stage II (mild)    stage II to III/notes 07/23/2014  . Diastolic heart failure secondary to hypertrophic cardiomyopathy ([place]) CARDIOLOGIST-- DR Daneen Schick  . Dyspnea    increased exertion   . Edema 07/2013  . 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 Gout, arthritis 2014   bil feet. right worse  . History of cellulitis of skin with lymphangitis LEFT LEG  . HOCM (hypertrophic obstructive cardiomyopathy) ([place])    a. Echo 11/16: Severe LVH, EF 55-60%, abnormal GLS consistent with HOCM, no SAM, mild LAE  . Hypertension 20 years  . Lymphedema of lower extremity LEFT  >  RIGHT   "using ankle high socks at home; pump doesn't work for me" (07/23/2014)  . Pacemaker    02/12/11  . Pneumonia 08/2011  . Seasonal asthma NO INHALERS   30 years  . Second degree Mobitz II AV block    with syncope, s/p  PPM  . Short of breath on exertion   . Type II diabetes mellitus ([place]) 2012   INSULIN DEPENDENT    MEDICATIONS AT HOME: Prior to Admission medications   Medication Sig Start Date End Date Taking? Authorizing Provider  ACCU-CHEK AVIVA PLUS test strip TEST 3 TIMES DAILY. ICD 10 E11.8 07/27/16   Boykin Nearing, MD  ACCU-CHEK FASTCLIX LANCETS MISC Use to test blood sugar 3 to 4 times daily. 01/02/18   Ladell Pier, MD  Agalsidase beta (FABRAZYME IV) Inject 115 mg into the vein every 14 (fourteen) days.     [provider]  albuterol (PROAIR HFA) 108 (90 Base) MCG/ACT inhaler Inhale 1 puff into the lungs every 6 (six) hours as needed for wheezing or shortness of breath. 05/29/18   Elsie Stain, MD  allopurinol (ZYLOPRIM) 100 MG tablet Take 3 tablets (300 mg total)  by mouth daily. 05/29/18   Elsie Stain, MD  amLODipine (NORVASC) 10 MG tablet Take 1 tablet (10 mg total) by mouth daily. 11/02/18   Love, Ivan Anchors, PA-C  aspirin 81 MG tablet Take 1 tablet (81 mg total) by mouth daily. For one more week then stop 11/02/18   Love, Ivan Anchors, PA-C  atorvastatin (LIPITOR) 80 MG tablet Take 1 tablet (80 mg total) by mouth daily at 6 PM. 11/02/18   Love, Ivan Anchors, PA-C  clopidogrel (PLAVIX) 75 MG tablet Take 1 tablet (75 mg total) by mouth daily. 11/15/18   Argentina Donovan, PA-C  fexofenadine (ALLEGRA) 180 MG tablet Take 1 tablet (180 mg total) by mouth daily. 08/01/18   Ladell Pier, MD  fluticasone (FLONASE) 50 MCG/ACT nasal spray Place 1 spray into both nostrils daily. Patient taking differently: Place 1 spray into both nostrils as needed for allergies.  05/12/17   Ladell Pier, MD  furosemide (LASIX) 20 MG tablet Take 1 tablet (20 mg total) by mouth daily. 11/03/18   Love, Ivan Anchors, PA-C  Insulin Lispro Prot & Lispro (HUMALOG 75/25 MIX) (75-25) 100 UNIT/ML Kwikpen INJECT 20 UNITS INTO THE SKIN 2 TIMES DAILY 11/02/18   Love, Ivan Anchors, PA-C  Insulin Pen Needle (PX SHORTLENGTH  PEN NEEDLES) 31G X 8 MM MISC 1 each by Does not apply route 2 (two) times daily before a meal. 05/29/18   Elsie Stain, MD     Objective:  EXAM:   Vitals:   11/15/18 1538  BP: 137/77  Pulse: 70  Resp: 16  Temp: 98.8 F (37.1 C)  TempSrc: Oral  SpO2: 95%  Weight: 279 lb 12.8 oz (126.9 kg)    General appearance : A&OX3. NAD. Non-toxic-appearing.  Limited exam to mitigate Covid-19 risk HEENT: Atraumatic and Normocephalic.  PERRLA. Chest/Lungs:  Breathing-non-labored, Good air entry bilaterally, breath sounds normal without rales, rhonchi, or wheezing  CVS: S1 S2 regular, no murmurs, gallops, rubs  Extremities: Bilateral Unna boots in place without erythema of extremities Neurology:  CN II-XII grossly intact, Non focal.   Psych:  TP linear. J/I WNL. Normal speech. Appropriate eye contact and affect.  Skin:  No Rash  Data Review Lab Results  Component Value Date   HGBA1C 7.4 (H) 10/20/2018   HGBA1C 7.4 (H) 10/19/2018   HGBA1C 7.8 (A) 05/29/2018     Assessment & Plan   1. Hospital discharge follow-up Much improved.  Has resumed all ADL.    2. Controlled type 2 diabetes mellitus with complication, with long-term current use of insulin (HCC) High today.  Work on diet.  Continue current medication regimen - Glucose (CBG)  3. Cerebrovascular accident (CVA) due to thrombosis of basilar artery ([place]) Can stop aspirin per hospital note once he has been taking Plavix and aspirin for 1 week together.  Pick up Plavix today!!  4. Essential hypertension Sub-optimal control - Basic metabolic panel  5. 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[place]) Resume fabryzyme tomorrow as scheduled  6. Acute cystitis without hematuria No s/sx  7. Peripheral edema Needs unna boots changed weekly - AMB referral to wound care center  Patient have been counseled extensively about nutrition and exercise  Return in about 1 month (around 12/15/2018) for with PCP-chronic medical conditions.  The patient was given  clear instructions to go to ER or return to medical center if symptoms don't improve, worsen or new problems develop. The patient verbalized understanding. The patient was told to call to get lab results if they  haven't heard anything in the next week.     Freeman Caldron, PA-C [facility] and [place] [facility], [place]   11/15/2018, 3:55 PM

## 2018-11-14 NOTE — Progress Notes (Signed)
Subjective:    [name], male    DOB: March 28, 1961, 58 y.o.   MRN: [id number] Consent for phone visit HPI 58 year old male with history of Fabry disease, COPD, T2DM, HTN, CHD s/p PPM who was admitted on 10/19/18 with difficulty walking, HA and left facial paresthesias. Work up revealed staph UTI and CT head negative. Exam revealed ataxia with nystagmus and diplopia. Neurology felt that stroke was due to small vessel disease and recommended DAPT X 3 weeks followed by Plavix alone. He was found to have staph UTI and treated with ceftriaxone.  Fabrazyme held in setting of acute illness and to resume in 2 weeks per nephrology input.  Therapy was ongoing and patient was noted to be limited by ataxia with diplopia and balance deficits.  CIR admit for ataxia related to stroke Admit date: 10/23/2018 Discharge date: 11/02/2018  Had a vomiting episode (just water) this am no abd pain, eating ok   Mr Posthumus has not started his plavix. He said the pharmacy didn't have it for him when he picked up his rxs and was told something about it not to be started until 6/13 (??) I see nothing about this, so I called the pharmacy and they have it for him and said it may have been an insurance issue.  He has been taking his ASA.  He will pick up the plavix today.  Also he has unna boots on and have not been changed since he was discharged on the 28th.     Pt would like UNNA boots changed or removed , used to go wound care at [facility] has some double vision and dizziness. Does not take out garbage or go down to mailbox due to steep incline  Able to use utensils Independent with dressing  Bathing limited by [employer]  Ambulating with cane  Instead of walker Pain Inventory Average Pain 0 Pain Right Now 0 My pain is no pain  In the last 24 hours, has pain interfered with the following? General activity 0 Relation with others 0 Enjoyment of life 0 What TIME of day is your pain  at its worst? no pain Sleep (in general) Good  Pain is worse with: no pain Pain improves with: no pain Relief from Meds: no pain  Mobility use a cane  Function I need assistance with the following:  household duties and shopping  Neuro/Psych numbness trouble walking dizziness if he gets up too fast/ still has double vision  And has unna boots on both feet  Prior Studies Any changes since last visit?  no  By phone message looks like Mr Downs refused OT, no mention of PT  Physicians involved in your care Primary care Karle Plumber   Family History  Problem Relation Age of Onset  . Stroke Mother   . Fabry's disease Mother   . Fabry's disease Brother   . Colon cancer Neg Hx    Social History   Socioeconomic History  . Marital status: Divorced    Spouse name: Not on file  . Number of children: Not on file  . Years of education: Not on file  . Highest education level: Not on file  Occupational History  . Not on file  Social Needs  . Financial resource strain: Not on file  . Food insecurity:    Worry: Never true    Inability: Never true  . Transportation needs:    Medical: No    Non-medical:  No  Tobacco Use  . Smoking status: Never Smoker  . Smokeless tobacco: Never Used  Substance and Sexual Activity  . Alcohol use: Yes    Comment: 07/23/2014 "might have a few drinks on holidays or at cookouts"  . Drug use: No  . Sexual activity: Yes  Lifestyle  . Physical activity:    Days per week: 2 days    Minutes per session: 20 min  . Stress: Only a little  Relationships  . Social connections:    Talks on phone: Twice a week    Gets together: Once a week    Attends religious service: More than 4 times per year    Active member of club or organization: Yes    Attends meetings of clubs or organizations: 1 to 4 times per year    Relationship status: Divorced  Other Topics Concern  . Not on file  Social History Narrative   Coaches [employer]    Past  Surgical History:  Procedure Laterality Date  . CARDIAC CATHETERIZATION  03-12-2011  DR Daneen Schick   HYPERTROPHIC CARDIOMYOPATHY WITH LV CAVITY  APPEARANCE CONSISTANT WITH SIGNIFICANT APICAL HYPERTROPHY/ NORMAL LVSF / EF 55%/ EVIDENCE OF DIASTOLIC DYSFUNCTION WITH EDP OF 23-71mmHg AFTER A-WAVE/ NORMAL CORONARY ARTERIES  . EVALUATION UNDER ANESTHESIA WITH FISTULECTOMY N/A 06/23/2015   Procedure: EXAM UNDER ANESTHESIA WITH POSSIBLE FISTULOTOMY;  Surgeon: Judeth Horn, MD;  Location: [facility];  Service: General;  Laterality: N/A;  . HERNIA REPAIR  [id number]   Umbilical Hernia Repair  . INCISION AND DRAINAGE PERIRECTAL ABSCESS Left 07/29/2014   Procedure: IRRIGATION AND DEBRIDEMENT PERIRECTAL ABSCESS;  Surgeon: Doreen Salvage, MD;  Location: [facility];  Service: General;  Laterality: Left;  Prone position  . INSERT / REPLACE / REMOVE PACEMAKER  02/12/2011   SJM implanted by Dr Rayann Heman for Mobitz II second degree AV block and syncope  . IRRIGATION AND DEBRIDEMENT ABSCESS N/A 07/27/2013   Procedure: IRRIGATION AND DEBRIDEMENT OF SKIN, SOFT TISSUE AND MUSCLES OF UPPER BACK (11X19X4cm) WITH 10 BLADE AND PULSATILE LAVAGE ;  Surgeon: Gayland Curry, MD;  Location: [facility];  Service: General;  Laterality: N/A;  . UMBILICAL HERNIA REPAIR  03/22/2012   Procedure: HERNIA REPAIR UMBILICAL ADULT;  Surgeon: Leighton Ruff, MD;  Location: [facility];  Service: General;  Laterality: N/A;  Umbilical Hernia Repair    Past Medical History:  Diagnosis Date  . Bell's palsy   . Bifascicular block   . Chronic bronchitis ([place])    "seasonal; get it q yr"  . Chronic diastolic CHF (congestive heart failure) ([place]) 2010  . Chronic kidney disease (CKD), stage II (mild)    stage II to III/notes 07/23/2014  . Diastolic heart failure secondary to hypertrophic cardiomyopathy ([place]) CARDIOLOGIST-- DR Daneen Schick  . Dyspnea    increased exertion   . Edema 07/2013  . 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 Gout,  arthritis 2014   bil feet. right worse  . History of cellulitis of skin with lymphangitis LEFT LEG  . HOCM (hypertrophic obstructive cardiomyopathy) ([place])    a. Echo 11/16: Severe LVH, EF 55-60%, abnormal GLS consistent with HOCM, no SAM, mild LAE  . Hypertension 20 years  . Lymphedema of lower extremity LEFT  >  RIGHT   "using ankle high socks at home; pump doesn't work for me" (07/23/2014)  . Pacemaker    02/12/11  . Pneumonia 08/2011  . Seasonal asthma NO INHALERS   30  years  . Second degree Mobitz II AV block    with syncope, s/p PPM  . Short of breath on exertion   . Type II diabetes mellitus ([place]) 2012   INSULIN DEPENDENT   There were no vitals taken for this visit.  Opioid Risk Score:   Fall Risk Score:  `1  Depression screen PHQ 2/9  Depression screen [facility] 2/9 11/14/2018 08/01/2018 05/29/2018 08/11/2017 05/12/2017 10/06/2016 09/18/2015  Decreased Interest 0 0 0 0 0 0 0  Down, Depressed, Hopeless 1 0 0 0 0 0 0  PHQ - 2 Score 1 0 0 0 0 0 0  Altered sleeping - - - - 1 0 1  Tired, decreased energy - - - - 0 0 1  Change in appetite - - - - 0 0 0  Feeling bad or failure about yourself  - - - - 0 0 0  Trouble concentrating - - - - 0 0 0  Moving slowly or fidgety/restless - - - - 0 0 0  Suicidal thoughts - - - - 0 0 0  PHQ-9 Score - - - - 1 0 2  Some recent data might be hidden    Review of Systems  Constitutional: Negative.   HENT: Negative.   Eyes: Positive for visual disturbance.       Still has double vision  Respiratory: Negative.   Cardiovascular: Negative.   Gastrointestinal: Positive for vomiting.       One episode of vomiting.  Endocrine: Negative.   Genitourinary: Negative.   Musculoskeletal:       Una boots  Skin: Negative.   Allergic/Immunologic: Negative.   Neurological: Positive for numbness.       Facial numbness only  Hematological: Negative.   Psychiatric/Behavioral: Negative.   All other systems reviewed and are negative.      Objective:   Physical  Exam Nursing note reviewed.  Neurological:     Mental Status: He is alert and oriented to person, place, and time.  Psychiatric:        Mood and Affect: Mood normal.        Behavior: Behavior normal.    Exam limited by phone visit format       Assessment & Plan:  1.  Brainstem infarct with ataxia and diplopia-we discussed that overall he has had a good functional recovery he is able to live independently.  He is not quite back to his baseline his balance is still off so he does not feel safe to go down an incline and take out his garbage.  He does get help from a friend but he does not like to imposed upon his friend too often.  We discussed that he may be a good candidate for for outpatient therapy however he does not have transportation and because of his diplopia he is not able to drive yet. We discussed that his diplopia may still improve over the next several months He will follow-up with neurology at the end of this month and he can be reassessed for this.  Has appt in am with PCP, we discussed that the meds will be managed by his PCP as before.  #2.  Lymphedema has been seen in the past by wound care in [facility] he was not aware that [place] long had a wound care center.  We will make a referral.  He has Unna boots that need to be changed.  He is also interested in discussing other options such as compression  hose instead of Unna boots.  He has difficulty keeping the Unna boots on and also finds it difficult to cover his legs and plastic for showering.  Patient will follow-up with physical medicine rehabilitation on as-needed basis.   Duration of call 28min

## 2018-11-15 ENCOUNTER — Ambulatory Visit: Payer: Medicare Other | Attending: Internal Medicine | Admitting: Physician Assistant

## 2018-11-15 ENCOUNTER — Other Ambulatory Visit: Payer: Self-pay

## 2018-11-15 VITALS — BP 137/77 | HR 70 | Temp 98.8°F | Resp 16 | Wt 279.8 lb

## 2018-11-15 DIAGNOSIS — E118 Type 2 diabetes mellitus with unspecified complications: Secondary | ICD-10-CM

## 2018-11-15 DIAGNOSIS — R609 Edema, unspecified: Secondary | ICD-10-CM

## 2018-11-15 DIAGNOSIS — I10 Essential (primary) hypertension: Secondary | ICD-10-CM

## 2018-11-15 DIAGNOSIS — Z09 Encounter for follow-up examination after completed treatment for conditions other than malignant neoplasm: Secondary | ICD-10-CM

## 2018-11-15 DIAGNOSIS — Z794 Long term (current) use of insulin: Secondary | ICD-10-CM

## 2018-11-15 DIAGNOSIS — N3000 Acute cystitis without hematuria: Secondary | ICD-10-CM

## 2018-11-15 DIAGNOSIS — I6302 Cerebral infarction due to thrombosis of basilar artery: Secondary | ICD-10-CM

## 2018-11-15 DIAGNOSIS — E7521 Fabry (-Anderson) disease: Secondary | ICD-10-CM

## 2018-11-15 LAB — GLUCOSE, POCT (MANUAL RESULT ENTRY): POC Glucose: 207 mg/dl — AB (ref 70–99)

## 2018-11-15 MED ORDER — CLOPIDOGREL BISULFATE 75 MG PO TABS: 75.0000 mg | ORAL_TABLET | Freq: Every day | ORAL | 1 refills | Status: DC

## 2018-11-17 ENCOUNTER — Other Ambulatory Visit: Payer: Self-pay

## 2018-11-17 ENCOUNTER — Ambulatory Visit (HOSPITAL_COMMUNITY)
Admission: RE | Admit: 2018-11-17 | Discharge: 2018-11-17 | Disposition: A | Discharge status: Home / Self Care | Payer: Medicare Other | Source: Ambulatory Visit | Attending: Nephrology | Admitting: Nephrology

## 2018-11-17 DIAGNOSIS — E7521 Fabry (-Anderson) disease: Secondary | ICD-10-CM | POA: Insufficient documentation

## 2018-11-17 MED ORDER — SODIUM CHLORIDE 0.9 % IV SOLN
115.0000 mg | INTRAVENOUS | Status: DC
  Administered 2018-11-17: 115 mg via INTRAVENOUS
  Filled 2018-11-17: qty 21

## 2018-11-17 MED ORDER — SODIUM CHLORIDE 0.9 % IV SOLN
INTRAVENOUS | Status: DC
  Administered 2018-11-17: 09:00:00 via INTRAVENOUS

## 2018-11-20 ENCOUNTER — Ambulatory Visit (INDEPENDENT_AMBULATORY_CARE_PROVIDER_SITE_OTHER): Payer: Medicare Other | Admitting: *Deleted

## 2018-11-20 ENCOUNTER — Telehealth: Payer: Self-pay | Admitting: Internal Medicine

## 2018-11-20 DIAGNOSIS — I442 Atrioventricular block, complete: Secondary | ICD-10-CM | POA: Diagnosis not present

## 2018-11-20 NOTE — Telephone Encounter (Signed)
Advised LifeAlert will be fine with pacemaker, also overdue for remote follow up. Asked patient to send manual transmission  Chanetta Marshall, NP 11/20/2018 10:47 AM

## 2018-11-20 NOTE — Telephone Encounter (Signed)
New Message  Patient was interested in getting life alert and wants to be advised of any effects on his pace maker. Please advise.

## 2018-11-21 LAB — CUP PACEART REMOTE DEVICE CHECK
Battery Remaining Longevity: 8 mo
Battery Remaining Percentage: 8 %
Battery Voltage: 2.69 V
Brady Statistic AP VP Percent: 74 %
Brady Statistic AP VS Percent: 1 %
Brady Statistic AS VP Percent: 25 %
Brady Statistic AS VS Percent: 1 %
Brady Statistic RA Percent Paced: 73 %
Brady Statistic RV Percent Paced: 99 %
Date Time Interrogation Session: 20200615144758
Implantable Lead Implant Date: 20120907
Implantable Lead Implant Date: 20120907
Implantable Lead Location: 753859
Implantable Lead Location: 753860
Implantable Pulse Generator Implant Date: 20120907
Lead Channel Impedance Value: 290 Ohm
Lead Channel Impedance Value: 400 Ohm
Lead Channel Pacing Threshold Amplitude: 0.625 V
Lead Channel Pacing Threshold Amplitude: 2.375 V
Lead Channel Pacing Threshold Pulse Width: 0.5 ms
Lead Channel Pacing Threshold Pulse Width: 0.5 ms
Lead Channel Sensing Intrinsic Amplitude: 5 mV
Lead Channel Sensing Intrinsic Amplitude: 7.6 mV
Lead Channel Setting Pacing Amplitude: 1.625
Lead Channel Setting Pacing Amplitude: 2.625
Lead Channel Setting Pacing Pulse Width: 0.5 ms
Lead Channel Setting Sensing Sensitivity: 2 mV
Pulse Gen Model: 2210
Pulse Gen Serial Number: 7267950

## 2018-11-28 ENCOUNTER — Other Ambulatory Visit: Payer: Self-pay

## 2018-11-28 ENCOUNTER — Encounter (HOSPITAL_BASED_OUTPATIENT_CLINIC_OR_DEPARTMENT_OTHER): Payer: Medicare Other | Attending: Internal Medicine

## 2018-11-28 DIAGNOSIS — I130 Hypertensive heart and chronic kidney disease with heart failure and stage 1 through stage 4 chronic kidney disease, or unspecified chronic kidney disease: Secondary | ICD-10-CM | POA: Insufficient documentation

## 2018-11-28 DIAGNOSIS — E1122 Type 2 diabetes mellitus with diabetic chronic kidney disease: Secondary | ICD-10-CM | POA: Diagnosis not present

## 2018-11-28 DIAGNOSIS — N182 Chronic kidney disease, stage 2 (mild): Secondary | ICD-10-CM | POA: Diagnosis not present

## 2018-11-28 DIAGNOSIS — E7521 Fabry (-Anderson) disease: Secondary | ICD-10-CM | POA: Diagnosis not present

## 2018-11-28 DIAGNOSIS — I890 Lymphedema, not elsewhere classified: Secondary | ICD-10-CM | POA: Diagnosis not present

## 2018-11-28 DIAGNOSIS — J449 Chronic obstructive pulmonary disease, unspecified: Secondary | ICD-10-CM | POA: Diagnosis not present

## 2018-11-28 DIAGNOSIS — I6930 Unspecified sequelae of cerebral infarction: Secondary | ICD-10-CM | POA: Diagnosis not present

## 2018-11-28 DIAGNOSIS — Z950 Presence of cardiac pacemaker: Secondary | ICD-10-CM | POA: Diagnosis not present

## 2018-11-28 DIAGNOSIS — Z794 Long term (current) use of insulin: Secondary | ICD-10-CM | POA: Insufficient documentation

## 2018-11-28 DIAGNOSIS — I509 Heart failure, unspecified: Secondary | ICD-10-CM | POA: Diagnosis not present

## 2018-11-30 ENCOUNTER — Encounter: Payer: Self-pay | Admitting: Cardiology

## 2018-11-30 ENCOUNTER — Telehealth: Payer: Self-pay

## 2018-11-30 NOTE — Progress Notes (Signed)
Remote pacemaker transmission.   

## 2018-11-30 NOTE — Telephone Encounter (Signed)
LEft vm for patient to call back to update medication list, allergies and chart before video visit on Monday.

## 2018-12-01 ENCOUNTER — Ambulatory Visit: Payer: Medicare Other | Admitting: Internal Medicine

## 2018-12-01 ENCOUNTER — Other Ambulatory Visit: Payer: Self-pay

## 2018-12-01 ENCOUNTER — Encounter (HOSPITAL_COMMUNITY): Payer: Self-pay

## 2018-12-01 ENCOUNTER — Ambulatory Visit (HOSPITAL_COMMUNITY)
Admission: RE | Admit: 2018-12-01 | Discharge: 2018-12-01 | Disposition: A | Discharge status: Home / Self Care | Payer: Medicare Other | Source: Ambulatory Visit | Attending: Nephrology | Admitting: Nephrology

## 2018-12-01 DIAGNOSIS — E7521 Fabry (-Anderson) disease: Secondary | ICD-10-CM | POA: Diagnosis not present

## 2018-12-01 MED ORDER — SODIUM CHLORIDE 0.9 % IV SOLN
115.0000 mg | INTRAVENOUS | Status: DC
  Administered 2018-12-01: 115 mg via INTRAVENOUS
  Filled 2018-12-01: qty 21

## 2018-12-01 MED ORDER — SODIUM CHLORIDE 0.9 % IV SOLN
INTRAVENOUS | Status: DC
  Administered 2018-12-01: 09:00:00 via INTRAVENOUS

## 2018-12-04 ENCOUNTER — Ambulatory Visit (INDEPENDENT_AMBULATORY_CARE_PROVIDER_SITE_OTHER): Payer: Medicare Other | Admitting: Adult Health

## 2018-12-04 ENCOUNTER — Encounter: Payer: Self-pay | Admitting: Adult Health

## 2018-12-04 ENCOUNTER — Other Ambulatory Visit: Payer: Self-pay

## 2018-12-04 DIAGNOSIS — E1169 Type 2 diabetes mellitus with other specified complication: Secondary | ICD-10-CM | POA: Diagnosis not present

## 2018-12-04 DIAGNOSIS — G463 Brain stem stroke syndrome: Secondary | ICD-10-CM | POA: Diagnosis not present

## 2018-12-04 DIAGNOSIS — I10 Essential (primary) hypertension: Secondary | ICD-10-CM | POA: Diagnosis not present

## 2018-12-04 DIAGNOSIS — E7521 Fabry (-Anderson) disease: Secondary | ICD-10-CM | POA: Diagnosis not present

## 2018-12-04 DIAGNOSIS — E669 Obesity, unspecified: Secondary | ICD-10-CM

## 2018-12-04 DIAGNOSIS — E785 Hyperlipidemia, unspecified: Secondary | ICD-10-CM

## 2018-12-04 NOTE — Progress Notes (Signed)
[facility] [address] [place]. [place] [address] [phone]       VIRTUAL VISIT FOLLOW UP NOTE  [name] Date of Birth:  [birth year] Medical Record Number:  [id number]   Reason for Referral:  hospital stroke follow up    Virtual Visit via Video Note  I connected with [name] on 12/04/18 at  8:15 AM EDT by a video enabled telemedicine application located remotely in my own home and verified that I am speaking with the correct person using two identifiers who was located at their own home.   I discussed the limitations of evaluation and management by telemedicine and the availability of in person appointments. The patient expressed understanding and agreed to proceed.Please see telephone note for additional scheduling information and consent.    CHIEF COMPLAINT:  Chief Complaint  Patient presents with  . Follow-up    Hospital stroke follow-up  . Diplopia    Post stroke -stable  . Gait Problem    Post stroke -improving    HPI: [name] was initially scheduled today for in office hospital follow-up regarding likely brainstem lacunar infarct secondary to small vessel disease too small to be seen on imaging on 10/19/2018 but due to COVID-19 safety precautions, visit transition to telemedicine via [email or web address] with patients consent. History obtained from patient and chart review. Reviewed all radiology images and labs personally.  [name] is a 58 y.o. male with history of diabetes, second-degree Mobitz with AV block requiring a pacemaker which is not MRI compatible, lymphedema in the lower extremities, HOCM, Fabry'sdisease, diastolic heart failure, chronic kidney disease, heart failure, and Bell's palsy  who presented on 10/19/2018 with headache, left facial paresthesias and pain behind his eyes.  Stroke work-up likely brainstem lacunar infarct secondary to small vessel disease too small to be seen on imaging could be related to  Fabry's disease.  CT head did show old left pontine lacune which was new finding since prior imaging in 2012.  TCD showed diffuse intracranial atherosclerosis.  Carotid Doppler and 2D echo unremarkable.  HIV negative.  UDS negative.  LDL 161 and A1c 7.4.  Recommended DAPT for 3 weeks and Plavix alone.  He continues to follow with Dr. Krista Blue at [facility] for ongoing management of Fabry's disease and recommended holding Fabrazyme in setting of acute illness and to resume 2 weeks prior nephrology input.  HTN stable.  Increase atorvastatin dose from 20 mg to 80 mg for HLD management.  Ongoing follow-up with PCP for DM management.  Other stroke risk factors include EtOH use, morbid obesity, family history of stroke, chronic diastolic CHF and hypertrophic obstructive cardiomyopathy with EF of 55 to 60%.  He was also found to have staph UTI and was treated with ceftriaxone.  Residual deficit of ataxia with diplopia and balance deficits and therefore discharged to CIR for ongoing therapy.  He was discharged home on 11/02/2018.   Residual deficits of mild gait difficulties, left cheek numbness sensation and blurred vision upon awakening but improved through out the day - therapies completed after 2 visits - he also felt as though this was not beneficial for him as he was having them do exercises he learned during admission which he continued to do on his own at home He lives independently where he has returned back to all prior ADLs - he has been retired since 2013; he has not returned to driving at this time due to diplopia - he has not followed  with ophthalmology at this time Completed 3 weeks DAPT and continues on Plavix alone without side effects of bleeding or bruising Continues on atorvastatin 80 mg daily without side effects myalgias Blood pressure - plans on obtaining his own cuff to monitor at home  He has resumed Fabrazyme on 11/17/2018 No further concerns at this time     ROS:   14 system review of systems  performed and negative with exception of diplopia, balance difficulties, swelling (BLE)   PMH:  Past Medical History:  Diagnosis Date  . Bell's palsy   . Bifascicular block   . Chronic bronchitis ([place])    "seasonal; get it q yr"  . Chronic diastolic CHF (congestive heart failure) ([place]) 2010  . Chronic kidney disease (CKD), stage II (mild)    stage II to III/notes 07/23/2014  . Diastolic heart failure secondary to hypertrophic cardiomyopathy ([place]) CARDIOLOGIST-- DR Daneen Schick  . Dyspnea    increased exertion   . Edema 07/2013  . 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 Gout, arthritis 2014   bil feet. right worse  . History of cellulitis of skin with lymphangitis LEFT LEG  . HOCM (hypertrophic obstructive cardiomyopathy) ([place])    a. Echo 11/16: Severe LVH, EF 55-60%, abnormal GLS consistent with HOCM, no SAM, mild LAE  . Hypertension 20 years  . Lymphedema of lower extremity LEFT  >  RIGHT   "using ankle high socks at home; pump doesn't work for me" (07/23/2014)  . Pacemaker    02/12/11  . Pneumonia 08/2011  . Seasonal asthma NO INHALERS   30 years  . Second degree Mobitz II AV block    with syncope, s/p PPM  . Short of breath on exertion   . Type II diabetes mellitus ([place]) 2012   INSULIN DEPENDENT    PSH:  Past Surgical History:  Procedure Laterality Date  . CARDIAC CATHETERIZATION  03-12-2011  DR Daneen Schick   HYPERTROPHIC CARDIOMYOPATHY WITH LV CAVITY  APPEARANCE CONSISTANT WITH SIGNIFICANT APICAL HYPERTROPHY/ NORMAL LVSF / EF 55%/ EVIDENCE OF DIASTOLIC DYSFUNCTION WITH EDP OF 23-1mmHg AFTER A-WAVE/ NORMAL CORONARY ARTERIES  . EVALUATION UNDER ANESTHESIA WITH FISTULECTOMY N/A 06/23/2015   Procedure: EXAM UNDER ANESTHESIA WITH POSSIBLE FISTULOTOMY;  Surgeon: Judeth Horn, MD;  Location: [facility];  Service: General;  Laterality: N/A;  . HERNIA REPAIR  [id number]   Umbilical Hernia Repair  . INCISION AND DRAINAGE PERIRECTAL ABSCESS Left 07/29/2014    Procedure: IRRIGATION AND DEBRIDEMENT PERIRECTAL ABSCESS;  Surgeon: Doreen Salvage, MD;  Location: [facility];  Service: General;  Laterality: Left;  Prone position  . INSERT / REPLACE / REMOVE PACEMAKER  02/12/2011   SJM implanted by Dr Rayann Heman for Mobitz II second degree AV block and syncope  . IRRIGATION AND DEBRIDEMENT ABSCESS N/A 07/27/2013   Procedure: IRRIGATION AND DEBRIDEMENT OF SKIN, SOFT TISSUE AND MUSCLES OF UPPER BACK (11X19X4cm) WITH 10 BLADE AND PULSATILE LAVAGE ;  Surgeon: Gayland Curry, MD;  Location: [facility];  Service: General;  Laterality: N/A;  . UMBILICAL HERNIA REPAIR  03/22/2012   Procedure: HERNIA REPAIR UMBILICAL ADULT;  Surgeon: Leighton Ruff, MD;  Location: [facility];  Service: General;  Laterality: N/A;  Umbilical Hernia Repair     Social History:  Social History   Socioeconomic History  . Marital status: Divorced    Spouse name: Not on file  . Number of children: Not on file  . Years of education: Not on file  .  Highest education level: Not on file  Occupational History  . Not on file  Social Needs  . Financial resource strain: Not on file  . Food insecurity    Worry: Never true    Inability: Never true  . Transportation needs    Medical: No    Non-medical: No  Tobacco Use  . Smoking status: Never Smoker  . Smokeless tobacco: Never Used  Substance and Sexual Activity  . Alcohol use: Yes    Comment: 07/23/2014 "might have a few drinks on holidays or at cookouts"  . Drug use: No  . Sexual activity: Yes  Lifestyle  . Physical activity    Days per week: 2 days    Minutes per session: 20 min  . Stress: Only a little  Relationships  . Social Herbalist on phone: Twice a week    Gets together: Once a week    Attends religious service: More than 4 times per year    Active member of club or organization: Yes    Attends meetings of clubs or organizations: 1 to 4 times per year    Relationship status: Divorced  . Intimate partner violence    Fear of  current or ex partner: No    Emotionally abused: No    Physically abused: No    Forced sexual activity: No  Other Topics Concern  . Not on file  Social History Narrative   Coaches [employer]     Family History:  Family History  Problem Relation Age of Onset  . Stroke Mother   . Fabry's disease Mother   . Fabry's disease Brother   . Colon cancer Neg Hx     Medications:   Current Outpatient Medications on File Prior to Visit  Medication Sig Dispense Refill  . ACCU-CHEK AVIVA PLUS test strip TEST 3 TIMES DAILY. ICD 10 E11.8 100 each 12  . ACCU-CHEK FASTCLIX LANCETS MISC Use to test blood sugar 3 to 4 times daily. 102 each 5  . Agalsidase beta (FABRAZYME IV) Inject 115 mg into the vein every 14 (fourteen) days.     [name] Kitchen albuterol (PROAIR HFA) 108 (90 Base) MCG/ACT inhaler Inhale 1 puff into the lungs every 6 (six) hours as needed for wheezing or shortness of breath. 8.5 Inhaler 1  . allopurinol (ZYLOPRIM) 100 MG tablet Take 3 tablets (300 mg total) by mouth daily. 90 tablet 1  . amLODipine (NORVASC) 10 MG tablet Take 1 tablet (10 mg total) by mouth daily. 30 tablet 0  . aspirin 81 MG tablet Take 1 tablet (81 mg total) by mouth daily. For one more week then stop 7 tablet 0  . atorvastatin (LIPITOR) 80 MG tablet Take 1 tablet (80 mg total) by mouth daily at 6 PM. 30 tablet 1  . clopidogrel (PLAVIX) 75 MG tablet Take 1 tablet (75 mg total) by mouth daily. 30 tablet 1  . fexofenadine (ALLEGRA) 180 MG tablet Take 1 tablet (180 mg total) by mouth daily. 30 tablet 3  . fluticasone (FLONASE) 50 MCG/ACT nasal spray Place 1 spray into both nostrils daily. (Patient taking differently: Place 1 spray into both nostrils as needed for allergies. ) 16 g 1  . furosemide (LASIX) 20 MG tablet Take 1 tablet (20 mg total) by mouth daily. 30 tablet 1  . Insulin Lispro Prot & Lispro (HUMALOG 75/25 MIX) (75-25) 100 UNIT/ML Kwikpen INJECT 20 UNITS INTO THE SKIN 2 TIMES DAILY 15 mL 2  . Insulin Pen Needle  (  PX SHORTLENGTH PEN NEEDLES) 31G X 8 MM MISC 1 each by Does not apply route 2 (two) times daily before a meal. 100 each 8   No current facility-administered medications on file prior to visit.     Allergies:   Allergies  Allergen Reactions  . Lisinopril Anaphylaxis  . Shellfish Allergy Hives and Swelling     Physical Exam  Depression screen [facility] 2/9 12/04/2018  Decreased Interest 0  Down, Depressed, Hopeless 0  PHQ - 2 Score 0  Altered sleeping -  Tired, decreased energy -  Change in appetite -  Feeling bad or failure about yourself  -  Trouble concentrating -  Moving slowly or fidgety/restless -  Suicidal thoughts -  PHQ-9 Score -  Some recent data might be hidden     General: well developed, well nourished, pleasant middle-aged African-American male, seated, in no evident distress Head: head normocephalic and atraumatic.    Neurologic Exam Mental Status: Awake and fully alert. Oriented to place and time. Recent and remote memory intact. Attention span, concentration and fund of knowledge appropriate. Mood and affect appropriate.  Cranial Nerves: Extraocular movements full without nystagmus.  Subjective diplopia. Hearing intact to voice. Facial sensation intact. Face, tongue, palate moves normally and symmetrically.  Shoulder shrug symmetric. Motor: No evidence of weakness per drift assessment Sensory.: intact to light touch Coordination: Rapid alternating movements normal in all extremities. Finger-to-nose and heel-to-shin performed accurately bilaterally. Gait and Station: Arises from chair without difficulty. Stance is normal. Gait demonstrates normal stride length and balance .  Reflexes: UTA   NIHSS  0 Modified Rankin  2    Diagnostic Data (Labs, Imaging, Testing)  CT HEAD WO CONTRAST 10/19/2018 IMPRESSION: 1. Chronic intracranial artery dolichoectasia with progressed calcified atherosclerosis since 2012. A lacune or infarct of the left pons is new since 2012  but probably chronic. Still, consider Acute Small Vessel Ischemia in this clinical setting. 2. No intracranial hemorrhage or cortically based infarct identified.  VAS [place] TCD 10/20/2018 Summary: Low normal mean flow velocities in majority of identified vessels of anterior and posterior cerebral circulations. Globally elevated pulsatility indices suggest diffuse intracranial atherosclerosis likely  VAS US CAROTID DUPLEX BILATERAL 10/20/2018 Summary: Right Carotid: Velocities in the right ICA are consistent with a 1-39% stenosis. Left Carotid: Velocities in the left ICA are consistent with a 1-39% stenosis. Vertebrals:  Bilateral vertebral arteries demonstrate antegrade flow. Subclavians: Normal flow hemodynamics were seen in bilateral subclavian              arteries.  ECHOCARDIOGRAM 10/20/2018 IMPRESSIONS  1. The left ventricle has normal systolic function, with an ejection fraction of 55-60%. The cavity size was normal. There is moderately increased left ventricular wall thickness. Left ventricular diastolic Doppler parameters are consistent with  impaired relaxation. Elevated left ventricular end-diastolic pressure The E/e' is >15. There is abnormal septal motion consistent with left bundle branch block. No evidence of left ventricular regional wall motion abnormalities.  2. The mitral valve is abnormal. Mild thickening of the mitral valve leaflet. There is moderate mitral annular calcification present.  3. The tricuspid valve is grossly normal.  4. The aortic valve is tricuspid. Mild sclerosis of the aortic valve. No stenosis of the aortic valve.    ASSESSMENT: EDIS HUISH is a 58 y.o. year old male here with likely brainstem lacunar infarct secondary to small vessel disease too small to be seen on imaging on 10/19/2018. Vascular risk factors include HTN, HLD, DM, Fabry's disease, diastolic heart failure,  and prior history of stroke on imaging.  He has been stable from a stroke  standpoint with residual diplopia and balance difficulties.    PLAN:  1. Likely brainstem stroke: Continue clopidogrel 75 mg daily  and atorvastatin 80 mg for secondary stroke prevention. Maintain strict control of hypertension with blood pressure goal below 130/90, diabetes with hemoglobin A1c goal below 6.5% and cholesterol with LDL cholesterol (bad cholesterol) goal below 70 mg/dL.  I also advised the patient to eat a healthy diet with plenty of whole grains, cereals, fruits and vegetables, exercise regularly with at least 30 minutes of continuous activity daily and maintain ideal body weight. 2. Diplopia/balance deficits: continue exercises on your own at home - advised to call office in regards to future interest in participating in outpatient PT/OT as he does not feel as though this would be beneficial at this time.  Also recommended to schedule follow-up visit with ophthalmology 3. HTN: Advised to continue current treatment regimen.  Recommended monitoring BP at home with continued follow-up with PCP for management 4. HLD: Advised to continue current treatment regimen along with continued follow-up with PCP for future prescribing and monitoring of lipid panel 5. DMII: Advised to continue to monitor glucose levels at home along with continued follow-up with PCP for management and monitoring 6. Fabry's disease: Continue to follow with Dr. Krista Blue for ongoing management and monitoring    Follow up in 3 months or call earlier if needed   Greater than 50% of time during this 30 minute non-face-to-face visit was spent on counseling, explanation of diagnosis of likely brainstem stroke, reviewing risk factor management of HTN, HLD, DM and Fabry's disease, planning of further management along with potential future management, and discussion with patient and family answering all questions.    Venancio Poisson, AGNP-BC  [facility] [address] [place], [place]  [address]  Phone [phone] Fax [phone] Note: This document was prepared with digital dictation and possible smart phrase technology. Any transcriptional errors that result from this process are unintentional.

## 2018-12-05 ENCOUNTER — Ambulatory Visit (HOSPITAL_COMMUNITY): Payer: Medicare Other

## 2018-12-05 NOTE — Progress Notes (Signed)
I agree with the above plan 

## 2018-12-11 ENCOUNTER — Ambulatory Visit: Payer: Medicare Other | Attending: Internal Medicine | Admitting: Internal Medicine

## 2018-12-11 ENCOUNTER — Other Ambulatory Visit: Payer: Self-pay

## 2018-12-11 ENCOUNTER — Encounter: Payer: Self-pay | Admitting: Internal Medicine

## 2018-12-11 VITALS — BP 104/63 | HR 62 | Temp 98.8°F | Resp 18 | Ht 70.0 in | Wt 285.0 lb

## 2018-12-11 DIAGNOSIS — E7521 Fabry (-Anderson) disease: Secondary | ICD-10-CM

## 2018-12-11 DIAGNOSIS — N183 Chronic kidney disease, stage 3 unspecified: Secondary | ICD-10-CM

## 2018-12-11 DIAGNOSIS — E1169 Type 2 diabetes mellitus with other specified complication: Secondary | ICD-10-CM

## 2018-12-11 DIAGNOSIS — E118 Type 2 diabetes mellitus with unspecified complications: Secondary | ICD-10-CM

## 2018-12-11 DIAGNOSIS — I10 Essential (primary) hypertension: Secondary | ICD-10-CM | POA: Diagnosis not present

## 2018-12-11 DIAGNOSIS — G463 Brain stem stroke syndrome: Secondary | ICD-10-CM

## 2018-12-11 DIAGNOSIS — E785 Hyperlipidemia, unspecified: Secondary | ICD-10-CM

## 2018-12-11 DIAGNOSIS — Z794 Long term (current) use of insulin: Secondary | ICD-10-CM

## 2018-12-11 LAB — GLUCOSE, POCT (MANUAL RESULT ENTRY): POC Glucose: 139 mg/dl — AB (ref 70–99)

## 2018-12-11 MED ORDER — INSULIN LISPRO PROT & LISPRO (75-25 MIX) 100 UNIT/ML KWIKPEN: PEN_INJECTOR | SUBCUTANEOUS | 6 refills | Status: DC

## 2018-12-11 MED ORDER — CLOPIDOGREL BISULFATE 75 MG PO TABS: 75.0000 mg | ORAL_TABLET | Freq: Every day | ORAL | 6 refills | Status: DC

## 2018-12-11 MED ORDER — ALLOPURINOL 100 MG PO TABS: 300.0000 mg | ORAL_TABLET | Freq: Every day | ORAL | 1 refills | Status: DC

## 2018-12-11 MED ORDER — ATORVASTATIN CALCIUM 80 MG PO TABS: 80.0000 mg | ORAL_TABLET | Freq: Every day | ORAL | 6 refills | Status: DC

## 2018-12-11 MED ORDER — FUROSEMIDE 20 MG PO TABS: 20.0000 mg | ORAL_TABLET | Freq: Every day | ORAL | 6 refills | Status: DC

## 2018-12-11 MED ORDER — AMLODIPINE BESYLATE 10 MG PO TABS: 10.0000 mg | ORAL_TABLET | Freq: Every day | ORAL | 6 refills | Status: DC

## 2018-12-11 NOTE — Progress Notes (Signed)
[name], male    DOB: January 21, 1961  MRN: [id number]  CC: Chronic disease management  Subjective: [name] is a 58 y.o. male who presents for chronic disease management and hospital follow-up His concerns today include:  Patient with history of diabetes type 2 with microalbuminuria, diastolic CHF with pacemaker (secconary to AV block with syncopy), CKD stage III, HOCM, HTN, HL, lymphedema. 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  MRI was not done due to pacemaker.  Thought to be due to likely small vessel disease related to Fabry's disease.  Dose of Lipitor was increased to 80 mg and patient discharged to inpatient physical therapy on aspirin and Plavix.  He experienced some AKI on CKD 3.  Lasix was held on discharge from the hospital. -Patient sent in patient rehabilitation for 10 days.  Brainstem CVA: Since discharge patient reports that he still has a little problem with his balance.  He uses a cane.  He has not had any falls.  Still has some diplopia that is worse in the mornings but gets better as the day progresses.  Still has some numbness on the left side of the face.  Aspirin discontinued after 3 weeks.  Currently on Plavix.  Saw neurology NP last week.  DM: Checking blood sugars 3 times a day.  Gives range of 86-115 in the mornings and 127-1 35 before dinner.  Most recent A1c was 7.4 on 10/20/2018. -Reports compliance with Humalog 75/25. -Doing well with eating habits. Up-to-date with eye exam  HTN: Reports compliance with amlodipine.  He is on a lower dose of furosemide.  He limits salt in the foods.  He denies any chest pains or shortness of breath.  Fabry's and CKD:  Infusion therapy for Fabry's disease has resumed.  He has a follow-up appointment with nephrology tomorrow   Patient Active Problem List   Diagnosis Date Noted  . 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 CVA  (cerebral vascular accident) ([place]) 10/20/2018  . UTI (urinary tract infection) 10/19/2018  . Complete heart block ([place]) 08/01/2018  . Upper airway cough syndrome 08/01/2018  . Lymphedema 10/06/2016  . Gout, arthritis   . 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 Onychomycosis of toenail 09/26/2014  . Diabetes mellitus type 2 in obese ([place])   . Iron deficiency anemia 07/29/2014  . CKD (chronic kidney disease), stage III ([place])   . Chronic diastolic HF (heart failure) ([place]) 03/27/2013  . Peripheral edema 07/20/2012  . Obstructive sleep apnea, suspected 06/21/2012  . Pacemaker-St.Jude 02/23/2012  . Hypertension 08/09/2011  . COPD with asthma ([place]) 08/09/2011  . Mobitz type II atrioventricular block 05/19/2011  . 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 ACCU-CHEK AVIVA PLUS test strip TEST 3 TIMES DAILY. ICD 10 E11.8 100 each 12  . ACCU-CHEK FASTCLIX LANCETS MISC Use to test blood sugar 3 to 4 times daily. 102 each 5  . Agalsidase beta (FABRAZYME IV) Inject 115 mg into the vein every 14 (fourteen) days.     [name] Kitchen albuterol (PROAIR HFA) 108 (90 Base) MCG/ACT inhaler Inhale 1 puff into the lungs every 6 (six) hours as needed for wheezing or shortness of breath. 8.5 Inhaler 1  . fexofenadine (ALLEGRA) 180 MG tablet Take 1 tablet (180 mg total) by mouth daily. 30 tablet 3  . fluticasone (FLONASE) 50 MCG/ACT  nasal spray Place 1 spray into both nostrils daily. (Patient taking differently: Place 1 spray into both nostrils as needed for allergies. ) 16 g 1  . Insulin Pen Needle (PX SHORTLENGTH PEN NEEDLES) 31G X 8 MM MISC 1 each by Does not apply route 2 (two) times daily before a meal. 100 each 8   No current facility-administered medications on file prior to visit.     Allergies  Allergen Reactions  . Lisinopril Anaphylaxis  . Shellfish Allergy Hives and Swelling    Social History   Socioeconomic History  . Marital  status: Divorced    Spouse name: Not on file  . Number of children: Not on file  . Years of education: Not on file  . Highest education level: Not on file  Occupational History  . Not on file  Social Needs  . Financial resource strain: Not on file  . Food insecurity    Worry: Never true    Inability: Never true  . Transportation needs    Medical: No    Non-medical: No  Tobacco Use  . Smoking status: Never Smoker  . Smokeless tobacco: Never Used  Substance and Sexual Activity  . Alcohol use: Yes    Comment: 07/23/2014 "might have a few drinks on holidays or at cookouts"  . Drug use: No  . Sexual activity: Yes  Lifestyle  . Physical activity    Days per week: 2 days    Minutes per session: 20 min  . Stress: Only a little  Relationships  . Social Herbalist on phone: Twice a week    Gets together: Once a week    Attends religious service: More than 4 times per year    Active member of club or organization: Yes    Attends meetings of clubs or organizations: 1 to 4 times per year    Relationship status: Divorced  . Intimate partner violence    Fear of current or ex partner: No    Emotionally abused: No    Physically abused: No    Forced sexual activity: No  Other Topics Concern  . 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 Stroke Mother   . Fabry's disease Mother   . Fabry's disease Brother   . Colon cancer Neg Hx     Past Surgical History:  Procedure Laterality Date  . CARDIAC CATHETERIZATION  03-12-2011  DR Daneen Schick   HYPERTROPHIC CARDIOMYOPATHY WITH LV CAVITY  APPEARANCE CONSISTANT WITH SIGNIFICANT APICAL HYPERTROPHY/ NORMAL LVSF / EF 55%/ EVIDENCE OF DIASTOLIC DYSFUNCTION WITH EDP OF 23-90mmHg AFTER A-WAVE/ NORMAL CORONARY ARTERIES  . EVALUATION UNDER ANESTHESIA WITH FISTULECTOMY N/A 06/23/2015   Procedure: EXAM UNDER ANESTHESIA WITH POSSIBLE FISTULOTOMY;  Surgeon: Judeth Horn, MD;  Location: [facility];  Service: General;  Laterality: N/A;  . HERNIA REPAIR  [id number]   Umbilical Hernia Repair  . INCISION AND DRAINAGE PERIRECTAL ABSCESS Left 07/29/2014   Procedure: IRRIGATION AND DEBRIDEMENT PERIRECTAL ABSCESS;  Surgeon: Doreen Salvage, MD;  Location: [facility];  Service: General;  Laterality: Left;  Prone position  . INSERT / REPLACE / REMOVE PACEMAKER  02/12/2011   SJM implanted by Dr Rayann Heman for Mobitz II second degree AV block and syncope  . IRRIGATION AND DEBRIDEMENT ABSCESS N/A 07/27/2013   Procedure: IRRIGATION AND DEBRIDEMENT OF SKIN, SOFT TISSUE AND MUSCLES OF UPPER BACK (11X19X4cm) WITH 10 BLADE  AND PULSATILE LAVAGE ;  Surgeon: Gayland Curry, MD;  Location: [facility];  Service: General;  Laterality: N/A;  . UMBILICAL HERNIA REPAIR  03/22/2012   Procedure: HERNIA REPAIR UMBILICAL ADULT;  Surgeon: Leighton Ruff, MD;  Location: [facility];  Service: General;  Laterality: N/A;  Umbilical Hernia Repair     ROS: Review of Systems Negative except as stated above  PHYSICAL EXAM: BP 104/63 (BP Location: Left Arm, Patient Position: Sitting, Cuff Size: Large)   Pulse 62   Temp 98.8 F (37.1 C) (Oral)   Resp 18   Ht 5\' 10"  (1.778 m)   Wt 285 lb (129.3 kg)   SpO2 98%   BMI 40.89 kg/m   Physical Exam  General appearance - alert, well appearing, and in no distress Mental status - normal mood, behavior, speech, dress, motor activity, and thought processes Eyes - pupils equal and reactive, extraocular eye movements intact Mouth - mucous membranes moist, pharynx normal without lesions Neck - supple, no significant adenopathy Chest - clear to auscultation, no wheezes, rales or rhonchi, symmetric air entry Heart - normal rate, regular rhythm, normal S1, S2, no murmurs, rubs, clicks or gallops Neurological -patient ambulates with a cane.  Is careful with turns.  Has low foot to floor clearance.  Power in the lower extremities 5/5 bilaterally.  Power in the upper extremities 5/5  bilaterally Extremities -patient has on compression socks along with a Velcro compressive device on both lower extremities. CMP Latest Ref Rng & Units 10/30/2018 10/24/2018 10/22/2018  Glucose 70 - 99 mg/dL 137(H) 99 97  BUN 6 - 20 mg/dL 34(H) 29(H) 29(H)  Creatinine 0.61 - 1.24 mg/dL 1.78(H) 1.87(H) 2.05(H)  Sodium 135 - 145 mmol/L 142 142 142  Potassium 3.5 - 5.1 mmol/L 4.6 4.2 3.6  Chloride 98 - 111 mmol/L 108 107 108  CO2 22 - 32 mmol/L 25 22 26   Calcium 8.9 - 10.3 mg/dL 8.6(L) 9.0 8.7(L)  Total Protein 6.5 - 8.1 g/dL - 7.0 -  Total Bilirubin 0.3 - 1.2 mg/dL - 0.9 -  Alkaline Phos 38 - 126 U/L - 55 -  AST 15 - 41 U/L - 25 -  ALT 0 - 44 U/L - 19 -   Lipid Panel     Component Value Date/Time   CHOL 232 (H) 10/20/2018 0324   CHOL 280 (H) 10/06/2016 1038   TRIG 89 10/20/2018 0324   HDL 53 10/20/2018 0324   HDL 59 10/06/2016 1038   CHOLHDL 4.4 10/20/2018 0324   VLDL 18 10/20/2018 0324   LDLCALC 161 (H) 10/20/2018 0324   LDLCALC 201 (H) 10/06/2016 1038    CBC    Component Value Date/Time   WBC 9.5 10/30/2018 0634   RBC 5.24 10/30/2018 0634   HGB 15.4 10/30/2018 0634   HCT 47.7 10/30/2018 0634   PLT 270 10/30/2018 0634   MCV 91.0 10/30/2018 0634   MCH 29.4 10/30/2018 0634   MCHC 32.3 10/30/2018 0634   RDW 13.2 10/30/2018 0634   LYMPHSABS 2.3 10/24/2018 0515   MONOABS 1.3 (H) 10/24/2018 0515   EOSABS 0.3 10/24/2018 0515   BASOSABS 0.0 10/24/2018 0515    ASSESSMENT AND PLAN: 1. Controlled type 2 diabetes mellitus with complication, with long-term current use of insulin ([place]) Commended him on good blood sugar readings.  Encouraged him to continue healthy eating habits.  Continue current dose of his insulin - Glucose (CBG) - CBC; Future - Comprehensive metabolic panel; Future - Insulin Lispro Prot & Lispro (HUMALOG  75/25 MIX) (75-25) 100 UNIT/ML Kwikpen; INJECT 20 UNITS INTO THE SKIN 2 TIMES DAILY  Dispense: 15 mL; Refill: 6  2. Essential hypertension At goal.   Continue current medications and low-salt diet - amLODipine (NORVASC) 10 MG tablet; Take 1 tablet (10 mg total) by mouth daily.  Dispense: 30 tablet; Refill: 6 - furosemide (LASIX) 20 MG tablet; Take 1 tablet (20 mg total) by mouth daily.  Dispense: 30 tablet; Refill: 6  3. Brain stem stroke syndrome Aspirin taken off med list says he is now just on the Plavix.  Agree with increased dose of Lipitor given that his LDL was not at goal.  Plan to check CMP - atorvastatin (LIPITOR) 80 MG tablet; Take 1 tablet (80 mg total) by mouth daily at 6 PM.  Dispense: 30 tablet; Refill: 6 - clopidogrel (PLAVIX) 75 MG tablet; Take 1 tablet (75 mg total) by mouth daily.  Dispense: 30 tablet; Refill: 6  4. 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[place]) Gets infusions through nephrology  5. Hyperlipidemia associated with type 2 diabetes mellitus ([place]) See #3 above  6. Morbid obesity ([place]) See #1 above  7. CKD (chronic kidney disease), stage III ([place]) Followed by nephrology  Patient was given the opportunity to ask questions.  Patient verbalized understanding of the plan and was able to repeat key elements of the plan.   Orders Placed This Encounter  Procedures  . CBC  . Comprehensive metabolic panel  . Glucose (CBG)     Requested Prescriptions   Signed Prescriptions Disp Refills  . allopurinol (ZYLOPRIM) 100 MG tablet 90 tablet 1    Sig: Take 3 tablets (300 mg total) by mouth daily.  [name] Kitchen amLODipine (NORVASC) 10 MG tablet 30 tablet 6    Sig: Take 1 tablet (10 mg total) by mouth daily.  [name] Kitchen atorvastatin (LIPITOR) 80 MG tablet 30 tablet 6    Sig: Take 1 tablet (80 mg total) by mouth daily at 6 PM.  . clopidogrel (PLAVIX) 75 MG tablet 30 tablet 6    Sig: Take 1 tablet (75 mg total) by mouth daily.  . furosemide (LASIX) 20 MG tablet 30 tablet 6    Sig: Take 1 tablet (20 mg total) by mouth daily.  . Insulin Lispro Prot & Lispro (HUMALOG 75/25 MIX) (75-25) 100 UNIT/ML Kwikpen 15 mL 6    Sig: INJECT 20 UNITS INTO THE SKIN 2 TIMES  DAILY    Return in about 3 months (around 03/13/2019).  Karle Plumber, MD, FACP

## 2018-12-13 ENCOUNTER — Encounter: Payer: Self-pay | Admitting: Internal Medicine

## 2018-12-15 ENCOUNTER — Ambulatory Visit (HOSPITAL_COMMUNITY)
Admission: RE | Admit: 2018-12-15 | Discharge: 2018-12-15 | Disposition: A | Discharge status: Home / Self Care | Payer: Medicare Other | Source: Ambulatory Visit | Attending: Nephrology | Admitting: Nephrology

## 2018-12-15 ENCOUNTER — Other Ambulatory Visit: Payer: Self-pay

## 2018-12-15 ENCOUNTER — Encounter (HOSPITAL_COMMUNITY): Payer: Self-pay

## 2018-12-15 DIAGNOSIS — E7521 Fabry (-Anderson) disease: Secondary | ICD-10-CM | POA: Diagnosis not present

## 2018-12-15 MED ORDER — SODIUM CHLORIDE 0.9 % IV SOLN
INTRAVENOUS | Status: DC
  Administered 2018-12-15: 09:00:00 via INTRAVENOUS

## 2018-12-15 MED ORDER — SODIUM CHLORIDE 0.9 % IV SOLN
115.0000 mg | INTRAVENOUS | Status: DC
  Administered 2018-12-15: 115 mg via INTRAVENOUS
  Filled 2018-12-15: qty 21

## 2018-12-15 NOTE — Discharge Instructions (Signed)
Fabry Disease Fabry disease is a rare genetic disorder. It occurs when the body cannot break down a type of fat. This causes the fat to build up in blood vessels that are inside of organs. Over time, this buildup of fat can affect:  The brain and spinal cord (nervous system).  The stomach and intestines (gastrointestinal system).  The heart and lungs (cardiovascular system).  The hands, feet, skin, and eyes. This condition can lead to life-threatening problems, such as heart attack, stroke, or kidney damage. Treatment may help prevent these problems and help you manage your symptoms. What are the causes? Fabry disease is caused by a change (mutation) in a gene. This mutation is passed from parent to child (inherited). The mutation prevents your body from making a protein (enzyme) that breaks down a certain type of fat (globotriaosylceramide). What increases the risk? You may be more likely to have Fabry disease if:  You are male.  You have a family history of the disease. What are the signs or symptoms? Symptoms of Fabry disease include:  A rash of small, raised, reddish-purple dots on the skin (angiokeratoma).  Cloudy eyes or other eye problems.  Long-term (chronic) pain, especially in the hands or feet.  Burning sensations in the hands and feet.  Gastrointestinal problems, such as bloating, diarrhea, and frequent bowel movements.  Ringing in the ears (tinnitus).  Hearing loss.  Decreased sweat production (hypohidrosis) or no sweat production (anhidrosis). Over time, untreated Fabry disease can cause:  Severe back pain in the area of the kidneys.  Heart attack.  Stroke. How is this diagnosed? This condition may be diagnosed based on:  Your symptoms and medical history.  Your family medical history.  A physical exam.  Blood tests.  Urine tests.  An ultrasound of your heart (echocardiogram). If you or your partner has Fabry disease and you are having a baby,  your baby can be tested (screened) for the condition. This may be done before birth (prenatal screening) or right after birth. How is this treated? This condition is managed with medicines, which may include:  Medicine to help the enzyme work better in your body.  Enzyme replacement therapy. This medicine performs the functions of the enzyme that your body does not produce.  Pain medicines.  Aspirin or other medicines to help prevent a stroke.  Blood pressure medicines. Other treatments may include:  Reducing and managing stress.  Avoiding hot and cold temperatures.  Changing your diet to avoid fats that your body cannot break down. ? If your child has Fabry disease, a dietitian may recommend a healthy, low-fat diet for your child. Your child may need to follow this diet through adulthood. You may also need treatment for kidney or heart problems, such as:  Surgery to repair the heart or to unblock an artery (angioplasty).  Having your blood filtered by a machine (dialysis), if your kidneys are damaged.  Receiving a donated kidney (kidney transplant), in severe cases. Follow these instructions at home: Lifestyle  Avoid stressful situations when possible. To manage your stress: ? Do relaxing activities, such as listening to music. ? Get plenty of sleep. ? Work with a Transport planner as needed.  Exercise carefully. Ask your health care provider what activities are safe for you.  Follow the diet given to you by your health care provider.  Have a good support system in place. Consider participating in a Fabry support group.  Do not use any products that contain nicotine or tobacco, such as cigarettes  and e-cigarettes. If you need help quitting, ask your health care provider. General instructions   Avoid very hot and very cold temperatures.  Take steps to avoid getting sick. Some illnesses can make Fabry disease worse. ? Wash your hands often with soap and water. If soap and  water are not available, use hand sanitizer. ? Avoid contact with people who are sick. ? Keep your vaccinations up to date. Get the flu shot (influenza vaccine) every year.  Ask your health care provider if it is safe for you to drive. If you have vision problems, you may not be legally able to drive.  Take over-the-counter and prescription medicines only as told by your health care provider.  Keep all follow-up visits as told by your health care provider. This is important. You will need periodic testing of your kidneys, heart, hearing, and nerves. Contact a health care provider if:  You have pain that gets worse or does not get better with medicine.  Your vision is getting gradually worse.  You urinate less often than usual.  You have swelling of your hands, feet, or lower legs that gets worse.  You feel increasingly weak or tired. Get help right away if:   You have any symptoms of a stroke. "BE FAST" is an easy way to remember the main warning signs of a stroke: ? B - Balance. Signs are dizziness, sudden trouble walking, or loss of balance. ? E - Eyes. Signs are trouble seeing or a sudden change in vision. ? F - Face. Signs are sudden weakness or numbness of the face, or the face or eyelid drooping on one side. ? A - Arms. Signs are weakness or numbness in an arm. This happens suddenly and usually on one side of the body. ? S - Speech. Signs are sudden trouble speaking, slurred speech, or trouble understanding what people say. ? T - Time. Time to call emergency services. Write down what time symptoms started.  You have other signs of a stroke, such as: ? A sudden, severe headache with no known cause. ? Nausea or vomiting. ? Seizure.  You feel sharp pain: ? In one or both sides of your abdomen, between your rib cage and your hips. ? In your middle back, on one or both sides of your spine. ? In your chest.  You are short of breath. These symptoms may represent a serious  problem that is an emergency. Do not wait to see if the symptoms will go away. Get medical help right away. Call your local emergency services (911 in the [place] Do not drive yourself to the hospital. Summary  Fabry disease is a rare genetic disorder that prevents the breakdown of a type of fat, causing it to build up in blood vessels that are inside of organs.  This buildup can lead to life-threatening problems, such as heart attack, stroke, or kidney damage.  Treatment for this condition may include enzyme replacement therapy. This medicine performs the functions of the enzyme that your body does not produce.  Your health care provider may recommend dietary changes to avoid the fats that your body cannot break down. This information is not intended to replace advice given to you by your health care provider. Make sure you discuss any questions you have with your health care provider. Document Released: 05/14/2002 Document Revised: 09/14/2018 Document Reviewed: 05/23/2017 Elsevier Patient Education  2020 [employer].

## 2018-12-18 ENCOUNTER — Telehealth: Payer: Self-pay | Admitting: *Deleted

## 2018-12-18 NOTE — Telephone Encounter (Signed)
Merry Proud called.  Stating that pt has Fabry Disease, followed by Dr. Krista Blue.  Is getting infusions at [facility] )ordered by Dr Marval Regal.  Asking to be infused here at our site.  If so, needs new orders, (this is infused every 2 wks, 4-6 hours each time).  [facility] would need inservice on infusion.  If needing information on this process please call Merry Proud at [facility] [phone].  From 09/05/18 note,  looks like p/w was started ([facility]).

## 2018-12-18 NOTE — Telephone Encounter (Signed)
This information was provided to [employer].  She is the nurse covering for [facility] while she is only vacation.

## 2018-12-18 NOTE — Telephone Encounter (Signed)
Dr. Krista Blue has also spoken directly with Barnett Applebaum, in [facility], concerning this patient getting scheduled at our office.

## 2018-12-20 ENCOUNTER — Encounter (HOSPITAL_COMMUNITY): Payer: Medicare Other

## 2018-12-29 ENCOUNTER — Ambulatory Visit (HOSPITAL_COMMUNITY)
Admission: RE | Admit: 2018-12-29 | Discharge: 2018-12-29 | Disposition: A | Discharge status: Home / Self Care | Payer: Medicare Other | Source: Ambulatory Visit | Attending: Nephrology | Admitting: Nephrology

## 2018-12-29 ENCOUNTER — Encounter (HOSPITAL_COMMUNITY): Payer: Self-pay

## 2018-12-29 ENCOUNTER — Other Ambulatory Visit: Payer: Self-pay

## 2018-12-29 DIAGNOSIS — E7521 Fabry (-Anderson) disease: Secondary | ICD-10-CM | POA: Diagnosis not present

## 2018-12-29 MED ORDER — SODIUM CHLORIDE 0.9 % IV SOLN
INTRAVENOUS | Status: DC
  Administered 2018-12-29: 08:00:00 via INTRAVENOUS

## 2018-12-29 MED ORDER — SODIUM CHLORIDE 0.9 % IV SOLN
115.0000 mg | INTRAVENOUS | Status: DC
  Administered 2018-12-29: 115 mg via INTRAVENOUS
  Filled 2018-12-29: qty 21

## 2019-01-03 ENCOUNTER — Encounter: Payer: Self-pay | Admitting: Internal Medicine

## 2019-01-03 NOTE — Progress Notes (Signed)
Saw nephrology 12/12/2018.  UA was neg for protein. CMP revealed nl LFTs.  Bun/Creat 28/2.08.

## 2019-01-05 ENCOUNTER — Encounter (HOSPITAL_COMMUNITY): Payer: Medicare Other

## 2019-01-12 ENCOUNTER — Other Ambulatory Visit: Payer: Self-pay

## 2019-01-12 ENCOUNTER — Encounter (HOSPITAL_COMMUNITY): Payer: Self-pay

## 2019-01-12 ENCOUNTER — Encounter (HOSPITAL_COMMUNITY)
Admission: RE | Admit: 2019-01-12 | Discharge: 2019-01-12 | Disposition: A | Discharge status: Home / Self Care | Payer: Medicare Other | Source: Ambulatory Visit | Attending: Nephrology | Admitting: Nephrology

## 2019-01-12 DIAGNOSIS — E1101 Type 2 diabetes mellitus with hyperosmolarity with coma: Secondary | ICD-10-CM | POA: Diagnosis not present

## 2019-01-12 DIAGNOSIS — E7521 Fabry (-Anderson) disease: Secondary | ICD-10-CM | POA: Diagnosis not present

## 2019-01-12 MED ORDER — SODIUM CHLORIDE 0.9 % IV SOLN
INTRAVENOUS | Status: DC
  Administered 2019-01-12: 11:00:00 via INTRAVENOUS

## 2019-01-12 MED ORDER — SODIUM CHLORIDE 0.9 % IV SOLN
115.0000 mg | INTRAVENOUS | Status: DC
  Administered 2019-01-12: 115 mg via INTRAVENOUS
  Filled 2019-01-12: qty 21

## 2019-01-17 ENCOUNTER — Other Ambulatory Visit: Payer: Self-pay | Admitting: Internal Medicine

## 2019-01-17 DIAGNOSIS — IMO0002 Reserved for concepts with insufficient information to code with codable children: Secondary | ICD-10-CM

## 2019-01-17 DIAGNOSIS — E1165 Type 2 diabetes mellitus with hyperglycemia: Secondary | ICD-10-CM

## 2019-01-18 MED ORDER — ACCU-CHEK AVIVA PLUS VI STRP: 1.0000 | ORAL_STRIP | Freq: Three times a day (TID) | 12 refills | Status: DC

## 2019-01-26 ENCOUNTER — Other Ambulatory Visit: Payer: Self-pay

## 2019-01-26 ENCOUNTER — Encounter (HOSPITAL_COMMUNITY): Payer: Self-pay

## 2019-01-26 ENCOUNTER — Encounter (HOSPITAL_COMMUNITY)
Admission: RE | Admit: 2019-01-26 | Discharge: 2019-01-26 | Disposition: A | Discharge status: Home / Self Care | Payer: Medicare Other | Source: Ambulatory Visit | Attending: Nephrology | Admitting: Nephrology

## 2019-01-26 DIAGNOSIS — E1101 Type 2 diabetes mellitus with hyperosmolarity with coma: Secondary | ICD-10-CM | POA: Diagnosis not present

## 2019-01-26 MED ORDER — SODIUM CHLORIDE 0.9 % IV SOLN
115.0000 mg | INTRAVENOUS | Status: DC
  Administered 2019-01-26: 115 mg via INTRAVENOUS
  Filled 2019-01-26: qty 21

## 2019-01-26 MED ORDER — SODIUM CHLORIDE 0.9 % IV SOLN
INTRAVENOUS | Status: DC
  Administered 2019-01-26: 08:00:00 via INTRAVENOUS

## 2019-01-26 NOTE — Discharge Instructions (Signed)
Agalsidase Beta injection What is this medicine? AGALSIDASE BETA is used to replace an enzyme that is missing in patients with Fabry disease. It is not a cure. This medicine may be used for other purposes; ask your health care provider or pharmacist if you have questions. COMMON BRAND NAME(S): Fabrazyme What should I tell my health care provider before I take this medicine? They need to know if you have any of these conditions:  heart disease  an unusual or allergic reaction to agalsidase beta, mannitol, other medicines, foods, dyes, or preservatives  pregnant or trying to get pregnant  breast-feeding How should I use this medicine? This medicine is for infusion into a vein. It is given by a health care professional in a hospital or clinic setting. Talk to your pediatrician regarding the use of this medicine in children. Special care may be needed. Overdosage: If you think you have taken too much of this medicine contact a poison control center or emergency room at once. NOTE: This medicine is only for you. Do not share this medicine with others. What if I miss a dose? It is important not to miss your dose. Call your doctor or health care professional if you are unable to keep an appointment. What may interact with this medicine?  amiodarone  chloroquine  gentamicin  hydroxychloroquine  monobenzone This list may not describe all possible interactions. Give your health care provider a list of all the medicines, herbs, non-prescription drugs, or dietary supplements you use. Also tell them if you smoke, drink alcohol, or use illegal drugs. Some items may interact with your medicine. What should I watch for while using this medicine? Visit your doctor or health care professional for regular checks on your progress. Tell your doctor or healthcare professional if your symptoms do not start to get better or if they get worse. There is a registry for patients with Fabry disease. The  registry is used to gather information about the disease and its effects. Talk to your health care provider if you would like to join the registry. What side effects may I notice from receiving this medicine? Side effects that you should report to your doctor or health care professional as soon as possible:  allergic reactions like skin rash, itching or hives, swelling of the face, lips, or tongue  breathing problems  chest pain, tightness  depression  dizziness  fast, irregular heart beat  swelling of the arms or legs Side effects that usually do not require medical attention (report to your doctor or health care professional if they continue or are bothersome):  aches or pains  anxiety  fever or chills at the time of injection  headache  nausea, vomiting  stomach pain, upset This list may not describe all possible side effects. Call your doctor for medical advice about side effects. You may report side effects to FDA at 1-800-FDA-1088. Where should I keep my medicine? This drug is given in a hospital or clinic and will not be stored at home. NOTE: This sheet is a summary. It may not cover all possible information. If you have questions about this medicine, talk to your doctor, pharmacist, or health care provider.  2020 Elsevier/Gold Standard (2006-01-31 12:25:00)  

## 2019-02-08 ENCOUNTER — Other Ambulatory Visit: Payer: Self-pay | Admitting: Critical Care Medicine

## 2019-02-08 DIAGNOSIS — I10 Essential (primary) hypertension: Secondary | ICD-10-CM

## 2019-02-08 NOTE — Telephone Encounter (Signed)
Hyzaar not listed in his medication list. Will defer to PCP for review.

## 2019-02-09 ENCOUNTER — Other Ambulatory Visit: Payer: Self-pay

## 2019-02-09 ENCOUNTER — Encounter (HOSPITAL_COMMUNITY): Payer: Self-pay

## 2019-02-09 ENCOUNTER — Ambulatory Visit (HOSPITAL_COMMUNITY)
Admission: RE | Admit: 2019-02-09 | Discharge: 2019-02-09 | Disposition: A | Discharge status: Home / Self Care | Payer: Medicare Other | Source: Ambulatory Visit | Attending: Nephrology | Admitting: Nephrology

## 2019-02-09 DIAGNOSIS — E7521 Fabry (-Anderson) disease: Secondary | ICD-10-CM | POA: Insufficient documentation

## 2019-02-09 MED ORDER — SODIUM CHLORIDE 0.9 % IV SOLN
INTRAVENOUS | Status: DC
  Administered 2019-02-09: 08:00:00 via INTRAVENOUS

## 2019-02-09 MED ORDER — SODIUM CHLORIDE 0.9 % IV SOLN
115.0000 mg | INTRAVENOUS | Status: DC
  Administered 2019-02-09: 115 mg via INTRAVENOUS
  Filled 2019-02-09: qty 21

## 2019-02-09 NOTE — Discharge Instructions (Signed)
Agalsidase Beta injection What is this medicine? AGALSIDASE BETA is used to replace an enzyme that is missing in patients with Fabry disease. It is not a cure. This medicine may be used for other purposes; ask your health care provider or pharmacist if you have questions. COMMON BRAND NAME(S): Fabrazyme What should I tell my health care provider before I take this medicine? They need to know if you have any of these conditions:  heart disease  an unusual or allergic reaction to agalsidase beta, mannitol, other medicines, foods, dyes, or preservatives  pregnant or trying to get pregnant  breast-feeding How should I use this medicine? This medicine is for infusion into a vein. It is given by a health care professional in a hospital or clinic setting. Talk to your pediatrician regarding the use of this medicine in children. Special care may be needed. Overdosage: If you think you have taken too much of this medicine contact a poison control center or emergency room at once. NOTE: This medicine is only for you. Do not share this medicine with others. What if I miss a dose? It is important not to miss your dose. Call your doctor or health care professional if you are unable to keep an appointment. What may interact with this medicine?  amiodarone  chloroquine  gentamicin  hydroxychloroquine  monobenzone This list may not describe all possible interactions. Give your health care provider a list of all the medicines, herbs, non-prescription drugs, or dietary supplements you use. Also tell them if you smoke, drink alcohol, or use illegal drugs. Some items may interact with your medicine. What should I watch for while using this medicine? Visit your doctor or health care professional for regular checks on your progress. Tell your doctor or healthcare professional if your symptoms do not start to get better or if they get worse. There is a registry for patients with Fabry disease. The  registry is used to gather information about the disease and its effects. Talk to your health care provider if you would like to join the registry. What side effects may I notice from receiving this medicine? Side effects that you should report to your doctor or health care professional as soon as possible:  allergic reactions like skin rash, itching or hives, swelling of the face, lips, or tongue  breathing problems  chest pain, tightness  depression  dizziness  fast, irregular heart beat  swelling of the arms or legs Side effects that usually do not require medical attention (report to your doctor or health care professional if they continue or are bothersome):  aches or pains  anxiety  fever or chills at the time of injection  headache  nausea, vomiting  stomach pain, upset This list may not describe all possible side effects. Call your doctor for medical advice about side effects. You may report side effects to FDA at 1-800-FDA-1088. Where should I keep my medicine? This drug is given in a hospital or clinic and will not be stored at home. NOTE: This sheet is a summary. It may not cover all possible information. If you have questions about this medicine, talk to your doctor, pharmacist, or health care provider.  2020 Elsevier/Gold Standard (2006-01-31 12:25:00)  

## 2019-02-11 NOTE — Telephone Encounter (Signed)
Received request for refill on Losartan/HCTZ.  I called t this a.m to verify whether he was still taking as it was listed as d/c on dischg summary from hosp 10/2018.  Pt tells me he was still taking the med and that they did not inform him at hosp dischg that he was to stop it.  He tells me he was still taking even when he saw me in July even though it was not on his med list. I told pt I will send RF on Cozaar/HCTZ but I would like for him to come to our lab sometime this wk to have blood test drawn that I had ordered when I saw him 12/2018.  This included CBC and CMP.

## 2019-02-13 ENCOUNTER — Ambulatory Visit: Payer: Medicare Other | Attending: Internal Medicine

## 2019-02-13 ENCOUNTER — Other Ambulatory Visit: Payer: Self-pay

## 2019-02-13 DIAGNOSIS — E118 Type 2 diabetes mellitus with unspecified complications: Secondary | ICD-10-CM

## 2019-02-14 ENCOUNTER — Telehealth: Payer: Self-pay | Admitting: Internal Medicine

## 2019-02-14 LAB — COMPREHENSIVE METABOLIC PANEL
ALT: 26 IU/L (ref 0–44)
AST: 31 IU/L (ref 0–40)
Albumin/Globulin Ratio: 1.3 (ref 1.2–2.2)
Albumin: 4.1 g/dL (ref 3.8–4.9)
Alkaline Phosphatase: 107 IU/L (ref 39–117)
BUN/Creatinine Ratio: 15 (ref 9–20)
BUN: 30 mg/dL — ABNORMAL HIGH (ref 6–24)
Bilirubin Total: 0.3 mg/dL (ref 0.0–1.2)
CO2: 20 mmol/L (ref 20–29)
Calcium: 9.7 mg/dL (ref 8.7–10.2)
Chloride: 105 mmol/L (ref 96–106)
Creatinine, Ser: 1.95 mg/dL — ABNORMAL HIGH (ref 0.76–1.27)
GFR calc Af Amer: 43 mL/min/{1.73_m2} — ABNORMAL LOW (ref >59)
GFR calc non Af Amer: 37 mL/min/{1.73_m2} — ABNORMAL LOW (ref >59)
Globulin, Total: 3.2 g/dL (ref 1.5–4.5)
Glucose: 147 mg/dL — ABNORMAL HIGH (ref 65–99)
Potassium: 4.7 mmol/L (ref 3.5–5.2)
Sodium: 145 mmol/L — ABNORMAL HIGH (ref 134–144)
Total Protein: 7.3 g/dL (ref 6.0–8.5)

## 2019-02-14 LAB — CBC
Hematocrit: 45.3 % (ref 37.5–51.0)
Hemoglobin: 15.1 g/dL (ref 13.0–17.7)
MCH: 29.2 pg (ref 26.6–33.0)
MCHC: 33.3 g/dL (ref 31.5–35.7)
MCV: 88 fL (ref 79–97)
Platelets: 221 10*3/uL (ref 150–450)
RBC: 5.18 x10E6/uL (ref 4.14–5.80)
RDW: 13.8 % (ref 11.6–15.4)
WBC: 7.3 10*3/uL (ref 3.4–10.8)

## 2019-02-14 NOTE — Telephone Encounter (Signed)
PC placed to pt on 02/11/2019 after I received RF request for Losartan/HCTZ.  According to dischg summary when he left hosp 10/2018, this was d/c. This med was also not on his med list when I saw him 12/2018.   However,  Pt tells me that he was still taking the med because he was not told on hosp discharge to stop it.  He is agreeable to coming to lab next week for blood test including chem to check kidney function.  I refill the Losartan/HCTZ.

## 2019-02-19 ENCOUNTER — Ambulatory Visit (INDEPENDENT_AMBULATORY_CARE_PROVIDER_SITE_OTHER): Payer: Medicare Other | Admitting: *Deleted

## 2019-02-19 DIAGNOSIS — I442 Atrioventricular block, complete: Secondary | ICD-10-CM | POA: Diagnosis not present

## 2019-02-19 LAB — CUP PACEART REMOTE DEVICE CHECK
Battery Remaining Longevity: 1 mo
Battery Remaining Percentage: 1 %
Battery Voltage: 2.63 V
Brady Statistic AP VP Percent: 72 %
Brady Statistic AP VS Percent: 1 %
Brady Statistic AS VP Percent: 27 %
Brady Statistic AS VS Percent: 1 %
Brady Statistic RA Percent Paced: 71 %
Brady Statistic RV Percent Paced: 99 %
Date Time Interrogation Session: 20200914131950
Implantable Lead Implant Date: 20120907
Implantable Lead Implant Date: 20120907
Implantable Lead Location: 753859
Implantable Lead Location: 753860
Implantable Pulse Generator Implant Date: 20120907
Lead Channel Impedance Value: 300 Ohm
Lead Channel Impedance Value: 410 Ohm
Lead Channel Pacing Threshold Amplitude: 0.75 V
Lead Channel Pacing Threshold Amplitude: 2 V
Lead Channel Pacing Threshold Pulse Width: 0.5 ms
Lead Channel Pacing Threshold Pulse Width: 0.5 ms
Lead Channel Sensing Intrinsic Amplitude: 5 mV
Lead Channel Sensing Intrinsic Amplitude: 7.6 mV
Lead Channel Setting Pacing Amplitude: 1.75 V
Lead Channel Setting Pacing Amplitude: 2.25 V
Lead Channel Setting Pacing Pulse Width: 0.5 ms
Lead Channel Setting Sensing Sensitivity: 2 mV
Pulse Gen Model: 2210
Pulse Gen Serial Number: 7267950

## 2019-02-23 ENCOUNTER — Other Ambulatory Visit: Payer: Self-pay

## 2019-02-23 ENCOUNTER — Ambulatory Visit (HOSPITAL_COMMUNITY)
Admission: RE | Admit: 2019-02-23 | Discharge: 2019-02-23 | Disposition: A | Discharge status: Home / Self Care | Payer: Medicare Other | Source: Ambulatory Visit | Attending: Nephrology | Admitting: Nephrology

## 2019-02-23 DIAGNOSIS — E7521 Fabry (-Anderson) disease: Secondary | ICD-10-CM | POA: Diagnosis not present

## 2019-02-23 MED ORDER — SODIUM CHLORIDE 0.9 % IV SOLN
INTRAVENOUS | Status: DC
  Administered 2019-02-23: 10:00:00 via INTRAVENOUS

## 2019-02-23 MED ORDER — SODIUM CHLORIDE 0.9 % IV SOLN
115.0000 mg | INTRAVENOUS | Status: DC
  Administered 2019-02-23: 115 mg via INTRAVENOUS
  Filled 2019-02-23: qty 23
  Filled 2019-02-23: qty 2

## 2019-03-02 ENCOUNTER — Encounter: Payer: Self-pay | Admitting: Cardiology

## 2019-03-02 NOTE — Progress Notes (Signed)
Remote pacemaker transmission.   

## 2019-03-07 ENCOUNTER — Other Ambulatory Visit: Payer: Self-pay

## 2019-03-07 ENCOUNTER — Ambulatory Visit (INDEPENDENT_AMBULATORY_CARE_PROVIDER_SITE_OTHER): Payer: Medicare Other | Admitting: Adult Health

## 2019-03-07 ENCOUNTER — Encounter: Payer: Self-pay | Admitting: Adult Health

## 2019-03-07 VITALS — BP 122/83 | HR 68 | Temp 97.7°F | Ht 70.0 in | Wt 284.8 lb

## 2019-03-07 DIAGNOSIS — E1169 Type 2 diabetes mellitus with other specified complication: Secondary | ICD-10-CM | POA: Diagnosis not present

## 2019-03-07 DIAGNOSIS — E669 Obesity, unspecified: Secondary | ICD-10-CM

## 2019-03-07 DIAGNOSIS — E785 Hyperlipidemia, unspecified: Secondary | ICD-10-CM | POA: Diagnosis not present

## 2019-03-07 DIAGNOSIS — G463 Brain stem stroke syndrome: Secondary | ICD-10-CM

## 2019-03-07 DIAGNOSIS — I10 Essential (primary) hypertension: Secondary | ICD-10-CM | POA: Diagnosis not present

## 2019-03-07 DIAGNOSIS — E7521 Fabry (-Anderson) disease: Secondary | ICD-10-CM

## 2019-03-07 NOTE — Progress Notes (Signed)
[facility] [address] [place]. [place] [address] [phone]       OFFICE FOLLOW UP NOTE  [name] Date of Birth:  1960-12-14 Medical Record Number:  [id number]   Reason for visit: Stroke follow-up   CHIEF COMPLAINT:  Chief Complaint  Patient presents with   Follow-up    3 mon f/u. Alone. Rm 9. No newc concerns at this time.     HPI: Stroke admission 10/19/2018: [name] is a 58 y.o. male with history of diabetes, second-degree Mobitz with AV block requiring a pacemaker which is not MRI compatible, lymphedema in the lower extremities, HOCM, Fabry'sdisease, diastolic heart failure, chronic kidney disease, heart failure, and Bell's palsy  who presented on 10/19/2018 with headache, left facial paresthesias and pain behind his eyes.  Stroke work-up likely brainstem lacunar infarct secondary to small vessel disease too small to be seen on imaging could be related to Fabry's disease.  CT head did show old left pontine lacune which was new finding since prior imaging in 2012.  TCD showed diffuse intracranial atherosclerosis.  Carotid Doppler and 2D echo unremarkable.  HIV negative.  UDS negative.  LDL 161 and A1c 7.4.  Recommended DAPT for 3 weeks and Plavix alone.  He continues to follow with Dr. Krista Blue at [facility] for ongoing management of Fabry's disease and recommended holding Fabrazyme in setting of acute illness and to resume 2 weeks prior nephrology input.  HTN stable.  Increase atorvastatin dose from 20 mg to 80 mg for HLD management.  Ongoing follow-up with PCP for DM management.  Other stroke risk factors include EtOH use, morbid obesity, family history of stroke, chronic diastolic CHF and hypertrophic obstructive cardiomyopathy with EF of 55 to 60%.  He was also found to have staph UTI and was treated with ceftriaxone.  Residual deficit of ataxia with diplopia and balance deficits and therefore discharged to CIR for ongoing therapy.  He was discharged  home on 11/02/2018.   Virtual visit 12/04/2018: Residual deficits of mild gait difficulties, left cheek numbness sensation and blurred vision upon awakening but improved through out the day - therapies completed after 2 visits - he also felt as though this was not beneficial for him as he was having them do exercises he learned during admission which he continued to do on his own at home He lives independently where he has returned back to all prior ADLs - he has been retired since 2013; he has not returned to driving at this time due to diplopia - he has not followed with ophthalmology at this time Completed 3 weeks DAPT and continues on Plavix alone without side effects of bleeding or bruising Continues on atorvastatin 80 mg daily without side effects myalgias Blood pressure - plans on obtaining his own cuff to monitor at home  He has resumed Fabrazyme on 11/17/2018 No further concerns at this time  Update 03/07/2019: [name] is being seen today for stroke follow-up.  He has been doing well form a stroke standpoint with only intermittent diplopia which typically worsens with bright light.  Continues to use cane for occasional fatigue/"winded" with prolonged ambulation but this has been stable without worsening. He continues to do HEP at home routinely. Continues on Plavix and atorvastatin for secondary stroke prevention without side effects.  Glucose levels have been stable.  Blood pressure today satisfactory at 122/83.  Denies new or worsening stroke/TIA symptoms. He continues to receive Fabrazyme infusions for Fabry disease and follows with  Dr. Krista Blue routinely.     ROS:   14 system review of systems performed and negative with exception of diplopia, balance difficulties, swelling (BLE)   PMH:  Past Medical History:  Diagnosis Date   Bell's palsy    Bifascicular block    Chronic bronchitis (HCC)    "seasonal; get it q yr"   Chronic diastolic CHF (congestive heart failure) ([place]) 2010     Chronic kidney disease (CKD), stage II (mild)    stage II to III/notes 1/61/0960   Diastolic heart failure secondary to hypertrophic cardiomyopathy ([place]) CARDIOLOGIST-- DR Daneen Schick   Dyspnea    increased exertion    Edema 07/2013   Fabry's disease ([place]) RENAL AND CARDIAC INVOVLEMENT   FOLLOWED DR [place]   Gout, arthritis 2014   bil feet. right worse   History of cellulitis of skin with lymphangitis LEFT LEG   HOCM (hypertrophic obstructive cardiomyopathy) ([place])    a. Echo 11/16: Severe LVH, EF 55-60%, abnormal GLS consistent with HOCM, no SAM, mild LAE   Hypertension 20 years   Lymphedema of lower extremity LEFT  >  RIGHT   "using ankle high socks at home; pump doesn't work for me" (07/23/2014)   Pacemaker    02/12/11   Pneumonia 08/2011   Seasonal asthma NO INHALERS   30 years   Second degree Mobitz II AV block    with syncope, s/p PPM   Short of breath on exertion    Type II diabetes mellitus ([place]) 2012   INSULIN DEPENDENT    PSH:  Past Surgical History:  Procedure Laterality Date   CARDIAC CATHETERIZATION  03-12-2011  DR [place] WITH LV CAVITY  APPEARANCE CONSISTANT WITH SIGNIFICANT APICAL HYPERTROPHY/ NORMAL LVSF / EF 55%/ EVIDENCE OF DIASTOLIC DYSFUNCTION WITH EDP OF 23-68mmHg AFTER A-WAVE/ NORMAL CORONARY ARTERIES   EVALUATION UNDER ANESTHESIA WITH FISTULECTOMY N/A 06/23/2015   Procedure: EXAM UNDER ANESTHESIA WITH POSSIBLE FISTULOTOMY;  Surgeon: Judeth Horn, MD;  Location: [facility];  Service: General;  Laterality: N/A;   HERNIA REPAIR  4540   Umbilical Hernia Repair   INCISION AND DRAINAGE PERIRECTAL ABSCESS Left 07/29/2014   Procedure: IRRIGATION AND DEBRIDEMENT PERIRECTAL ABSCESS;  Surgeon: Doreen Salvage, MD;  Location: [facility];  Service: General;  Laterality: Left;  Prone position   INSERT / REPLACE / REMOVE PACEMAKER  02/12/2011   SJM implanted by Dr Rayann Heman for Mobitz II second degree AV block and syncope   IRRIGATION  AND DEBRIDEMENT ABSCESS N/A 07/27/2013   Procedure: IRRIGATION AND DEBRIDEMENT OF SKIN, SOFT TISSUE AND MUSCLES OF UPPER BACK (11X19X4cm) WITH 10 BLADE AND PULSATILE LAVAGE ;  Surgeon: Gayland Curry, MD;  Location: [facility];  Service: General;  Laterality: N/A;   UMBILICAL HERNIA REPAIR  03/22/2012   Procedure: HERNIA REPAIR UMBILICAL ADULT;  Surgeon: Leighton Ruff, MD;  Location: [facility];  Service: General;  Laterality: N/A;  Umbilical Hernia Repair     Social History:  Social History   Socioeconomic History   Marital status: Divorced    Spouse name: Not on file   Number of children: Not on file   Years of education: Not on file   Highest education level: Not on file  Occupational History   Not on file  Social Needs   Financial resource strain: Not on file   Food insecurity    Worry: Never true    Inability: Never true   Transportation needs    Medical: No  Non-medical: No  Tobacco Use   Smoking status: Never Smoker   Smokeless tobacco: Never Used  Substance and Sexual Activity   Alcohol use: Yes    Comment: 07/23/2014 "might have a few drinks on holidays or at cookouts"   Drug use: No   Sexual activity: Yes  Lifestyle   Physical activity    Days per week: 2 days    Minutes per session: 20 min   Stress: Only a little  Relationships   Press photographer on phone: Twice a week    Gets together: Once a week    Attends religious service: More than 4 times per year    Active member of club or organization: Yes    Attends meetings of clubs or organizations: 1 to 4 times per year    Relationship status: Divorced   Intimate partner violence    Fear of current or ex partner: No    Emotionally abused: No    Physically abused: No    Forced sexual activity: No  Other Topics Concern   Not on file  Social History Narrative   Coaches [employer]     Family History:  Family History  Problem Relation Age of Onset   Stroke Mother    Fabry's  disease Mother    Fabry's disease Brother    Colon cancer Neg Hx     Medications:   Current Outpatient Medications on File Prior to Visit  Medication Sig Dispense Refill   Accu-Chek FastClix Lancets MISC 1 each by Other route 3 (three) times daily. 102 each 12   Agalsidase beta (FABRAZYME IV) Inject 115 mg into the vein every 14 (fourteen) days.      albuterol (PROAIR HFA) 108 (90 Base) MCG/ACT inhaler Inhale 1 puff into the lungs every 6 (six) hours as needed for wheezing or shortness of breath. 8.5 Inhaler 1   allopurinol (ZYLOPRIM) 100 MG tablet Take 3 tablets (300 mg total) by mouth daily. 90 tablet 1   amLODipine (NORVASC) 10 MG tablet Take 1 tablet (10 mg total) by mouth daily. 30 tablet 6   atorvastatin (LIPITOR) 80 MG tablet Take 1 tablet (80 mg total) by mouth daily at 6 PM. 30 tablet 6   clopidogrel (PLAVIX) 75 MG tablet Take 1 tablet (75 mg total) by mouth daily. 30 tablet 6   fexofenadine (ALLEGRA) 180 MG tablet Take 1 tablet (180 mg total) by mouth daily. 30 tablet 3   fluticasone (FLONASE) 50 MCG/ACT nasal spray Place 1 spray into both nostrils daily. (Patient taking differently: Place 1 spray into both nostrils as needed for allergies. ) 16 g 1   furosemide (LASIX) 20 MG tablet Take 1 tablet (20 mg total) by mouth daily. 30 tablet 6   glucose blood (ACCU-CHEK AVIVA PLUS) test strip 1 each by Other route 3 (three) times daily. Use as instructed 100 each 12   Insulin Lispro Prot & Lispro (HUMALOG 75/25 MIX) (75-25) 100 UNIT/ML Kwikpen INJECT 20 UNITS INTO THE SKIN 2 TIMES DAILY 15 mL 6   Insulin Pen Needle (PX SHORTLENGTH PEN NEEDLES) 31G X 8 MM MISC 1 each by Does not apply route 2 (two) times daily before a meal. 100 each 8   losartan-hydrochlorothiazide (HYZAAR) 50-12.5 MG tablet TAKE 1 TABLET BY MOUTH EVERY DAY 90 tablet 1   No current facility-administered medications on file prior to visit.     Allergies:   Allergies  Allergen Reactions   Lisinopril  Anaphylaxis  Shellfish Allergy Hives and Swelling     Physical Exam  Today's Vitals   03/07/19 0924  BP: 122/83  Pulse: 68  Temp: 97.7 F (36.5 C)  TempSrc: Oral  Weight: 284 lb 12.8 oz (129.2 kg)  Height: 5\' 10"  (1.778 m)   Body mass index is 40.86 kg/m.  General: Obese pleasant middle-aged African-American male, seated, in no evident distress Head: head normocephalic and atraumatic.   Neck: supple with no carotid or supraclavicular bruits Cardiovascular: regular rate and rhythm, no murmurs; bilateral lower extremity lymphedema Musculoskeletal: no deformity Skin:  no rash/petichiae Vascular:  Normal pulses all extremities   Neurologic Exam Mental Status: Awake and fully alert. Oriented to place and time. Recent and remote memory intact. Attention span, concentration and fund of knowledge appropriate. Mood and affect appropriate.  Cranial Nerves: Fundoscopic exam reveals sharp disc margins. Pupils equal, briskly reactive to light. Extraocular movements full without nystagmus. Visual fields full to confrontation. Hearing intact. Facial sensation intact. Face, tongue, palate moves normally and symmetrically.  Motor: Normal bulk and tone. Normal strength in all tested extremity muscles. Sensory.: intact to touch , pinprick , position and vibratory sensation.  Coordination: Rapid alternating movements normal in all extremities. Finger-to-nose performed accurately bilaterally and difficulty performing heel-to-shin due to lymphedema. Gait and Station: Arises from chair without difficulty. Stance is normal. Gait demonstrates normal stride length with occasional imbalance and intermittent use of cane Reflexes: 1+ and symmetric. Toes downgoing.      Diagnostic Data (Labs, Imaging, Testing)  CT HEAD WO CONTRAST 10/19/2018 IMPRESSION: 1. Chronic intracranial artery dolichoectasia with progressed calcified atherosclerosis since 2012. A lacune or infarct of the left pons is new  since 2012 but probably chronic. Still, consider Acute Small Vessel Ischemia in this clinical setting. 2. No intracranial hemorrhage or cortically based infarct identified.  VAS [place] TCD 10/20/2018 Summary: Low normal mean flow velocities in majority of identified vessels of anterior and posterior cerebral circulations. Globally elevated pulsatility indices suggest diffuse intracranial atherosclerosis likely  VAS US CAROTID DUPLEX BILATERAL 10/20/2018 Summary: Right Carotid: Velocities in the right ICA are consistent with a 1-39% stenosis. Left Carotid: Velocities in the left ICA are consistent with a 1-39% stenosis. Vertebrals:  Bilateral vertebral arteries demonstrate antegrade flow. Subclavians: Normal flow hemodynamics were seen in bilateral subclavian              arteries.  ECHOCARDIOGRAM 10/20/2018 IMPRESSIONS  1. The left ventricle has normal systolic function, with an ejection fraction of 55-60%. The cavity size was normal. There is moderately increased left ventricular wall thickness. Left ventricular diastolic Doppler parameters are consistent with  impaired relaxation. Elevated left ventricular end-diastolic pressure The E/e' is >15. There is abnormal septal motion consistent with left bundle branch block. No evidence of left ventricular regional wall motion abnormalities.  2. The mitral valve is abnormal. Mild thickening of the mitral valve leaflet. There is moderate mitral annular calcification present.  3. The tricuspid valve is grossly normal.  4. The aortic valve is tricuspid. Mild sclerosis of the aortic valve. No stenosis of the aortic valve.    ASSESSMENT: [name] is a 58 y.o. year old male here with likely brainstem lacunar infarct secondary to small vessel disease too small to be seen on imaging on 10/19/2018. Vascular risk factors include HTN, HLD, DM, Fabry's disease, diastolic heart failure, and prior history of stroke on imaging.  Residual deficits of mild  intermittent diplopia but otherwise recovered well    PLAN:  1. Likely  brainstem stroke: Continue clopidogrel 75 mg daily  and atorvastatin 80 mg for secondary stroke prevention. Maintain strict control of hypertension with blood pressure goal below 130/90, diabetes with hemoglobin A1c goal below 6.5% and cholesterol with LDL cholesterol (bad cholesterol) goal below 70 mg/dL.  I also advised the patient to eat a healthy diet with plenty of whole grains, cereals, fruits and vegetables, exercise regularly with at least 30 minutes of continuous activity daily and maintain ideal body weight. 2. HTN: Advised to continue current treatment regimen.  Recommended monitoring BP at home with continued follow-up with PCP for management 3. HLD: Advised to continue current treatment regimen along with continued follow-up with PCP for future prescribing and monitoring of lipid panel 4. DMII: Advised to continue to monitor glucose levels at home along with continued follow-up with PCP for management and monitoring 5. Fabry's disease: Continue to follow with Dr. Krista Blue for ongoing management and monitoring   Follow-up as needed as stable from stroke standpoint   Greater than 50% of time during this 25 minute visit was spent on counseling, explanation of diagnosis of likely brainstem stroke, reviewing risk factor management of HTN, HLD, DM and Fabry's disease, planning of further management along with potential future management, and discussion with patient answering all questions to patient satisfaction    Frann Rider, [facility]  [facility] [address] [place], [place] [address]  Phone [phone] Fax [phone] Note: This document was prepared with digital dictation and possible smart phrase technology. Any transcriptional errors that result from this process are unintentional.

## 2019-03-07 NOTE — Patient Instructions (Addendum)
Continue clopidogrel 75 mg daily  and Lipitor for secondary stroke prevention  Continue to follow up with PCP regarding cholesterol, blood pressure and diabetes management   Continue to do exercises at home for ongoing improvement   Continue to monitor blood pressure at home  Maintain strict control of hypertension with blood pressure goal below 130/90, diabetes with hemoglobin A1c goal below 6.5% and cholesterol with LDL cholesterol (bad cholesterol) goal below 70 mg/dL. I also advised the patient to eat a healthy diet with plenty of whole grains, cereals, fruits and vegetables, exercise regularly and maintain ideal body weight.  Continue to follow with Dr. Krista Blue regarding Fabry's disease and fabrazyme infusions        Thank you for coming to see [place] at [facility]. I hope we have been able to provide you high quality care today.  You may receive a patient satisfaction survey over the next few weeks. We would appreciate your feedback and comments so that we may continue to improve ourselves and the health of our patients.

## 2019-03-07 NOTE — Progress Notes (Signed)
I agree with the above plan 

## 2019-03-09 ENCOUNTER — Encounter (HOSPITAL_COMMUNITY)
Admission: RE | Admit: 2019-03-09 | Discharge: 2019-03-09 | Disposition: A | Discharge status: Home / Self Care | Payer: Medicare Other | Source: Ambulatory Visit | Attending: Nephrology | Admitting: Nephrology

## 2019-03-09 ENCOUNTER — Other Ambulatory Visit: Payer: Self-pay

## 2019-03-09 ENCOUNTER — Encounter (HOSPITAL_COMMUNITY): Payer: Self-pay

## 2019-03-09 DIAGNOSIS — E7521 Fabry (-Anderson) disease: Secondary | ICD-10-CM | POA: Diagnosis not present

## 2019-03-09 DIAGNOSIS — E1101 Type 2 diabetes mellitus with hyperosmolarity with coma: Secondary | ICD-10-CM | POA: Diagnosis not present

## 2019-03-09 MED ORDER — SODIUM CHLORIDE 0.9 % IV SOLN
INTRAVENOUS | Status: DC
  Administered 2019-03-09: 08:00:00 via INTRAVENOUS

## 2019-03-09 MED ORDER — SODIUM CHLORIDE 0.9 % IV SOLN
115.0000 mg | INTRAVENOUS | Status: DC
  Administered 2019-03-09: 115 mg via INTRAVENOUS
  Filled 2019-03-09: qty 21

## 2019-03-09 NOTE — Discharge Instructions (Signed)
Fabry Disease °Fabry disease is a rare genetic disorder. It occurs when the body cannot break down a type of fat. This causes the fat to build up in blood vessels that are inside of organs. Over time, this buildup of fat can affect: °· The brain and spinal cord (nervous system). °· The stomach and intestines (gastrointestinal system). °· The heart and lungs (cardiovascular system). °· The hands, feet, skin, and eyes. °This condition can lead to life-threatening problems, such as heart attack, stroke, or kidney damage. Treatment may help prevent these problems and help you manage your symptoms. °What are the causes? °Fabry disease is caused by a change (mutation) in a gene. This mutation is passed from parent to child (inherited). The mutation prevents your body from making a protein (enzyme) that breaks down a certain type of fat (globotriaosylceramide). °What increases the risk? °You may be more likely to have Fabry disease if: °· You are male. °· You have a family history of the disease. °What are the signs or symptoms? °Symptoms of Fabry disease include: °· A rash of small, raised, reddish-purple dots on the skin (angiokeratoma). °· Cloudy eyes or other eye problems. °· Long-term (chronic) pain, especially in the hands or feet. °· Burning sensations in the hands and feet. °· Gastrointestinal problems, such as bloating, diarrhea, and frequent bowel movements. °· Ringing in the ears (tinnitus). °· Hearing loss. °· Decreased sweat production (hypohidrosis) or no sweat production (anhidrosis). °Over time, untreated Fabry disease can cause: °· Severe back pain in the area of the kidneys. °· Heart attack. °· Stroke. °How is this diagnosed? °This condition may be diagnosed based on: °· Your symptoms and medical history. °· Your family medical history. °· A physical exam. °· Blood tests. °· Urine tests. °· An ultrasound of your heart (echocardiogram). °If you or your partner has Fabry disease and you are having a baby,  your baby can be tested (screened) for the condition. This may be done before birth (prenatal screening) or right after birth. °How is this treated? °This condition is managed with medicines, which may include: °· Medicine to help the enzyme work better in your body. °· Enzyme replacement therapy. This medicine performs the functions of the enzyme that your body does not produce. °· Pain medicines. °· Aspirin or other medicines to help prevent a stroke. °· Blood pressure medicines. °Other treatments may include: °· Reducing and managing stress. °· Avoiding hot and cold temperatures. °· Changing your diet to avoid fats that your body cannot break down. °? If your child has Fabry disease, a dietitian may recommend a healthy, low-fat diet for your child. Your child may need to follow this diet through adulthood. °You may also need treatment for kidney or heart problems, such as: °· Surgery to repair the heart or to unblock an artery (angioplasty). °· Having your blood filtered by a machine (dialysis), if your kidneys are damaged. °· Receiving a donated kidney (kidney transplant), in severe cases. °Follow these instructions at home: °Lifestyle °· Avoid stressful situations when possible. To manage your stress: °? Do relaxing activities, such as listening to music. °? Get plenty of sleep. °? Work with a therapist as needed. °· Exercise carefully. Ask your health care provider what activities are safe for you. °· Follow the diet given to you by your health care provider. °· Have a good support system in place. Consider participating in a Fabry support group. °· Do not use any products that contain nicotine or tobacco, such as cigarettes   and e-cigarettes. If you need help quitting, ask your health care provider. °General instructions ° °· Avoid very hot and very cold temperatures. °· Take steps to avoid getting sick. Some illnesses can make Fabry disease worse. °? Wash your hands often with soap and water. If soap and  water are not available, use hand sanitizer. °? Avoid contact with people who are sick. °? Keep your vaccinations up to date. Get the flu shot (influenza vaccine) every year. °· Ask your health care provider if it is safe for you to drive. If you have vision problems, you may not be legally able to drive. °· Take over-the-counter and prescription medicines only as told by your health care provider. °· Keep all follow-up visits as told by your health care provider. This is important. You will need periodic testing of your kidneys, heart, hearing, and nerves. °Contact a health care provider if: °· You have pain that gets worse or does not get better with medicine. °· Your vision is getting gradually worse. °· You urinate less often than usual. °· You have swelling of your hands, feet, or lower legs that gets worse. °· You feel increasingly weak or tired. °Get help right away if: ° °· You have any symptoms of a stroke. "BE FAST" is an easy way to remember the main warning signs of a stroke: °? B - Balance. Signs are dizziness, sudden trouble walking, or loss of balance. °? E - Eyes. Signs are trouble seeing or a sudden change in vision. °? F - Face. Signs are sudden weakness or numbness of the face, or the face or eyelid drooping on one side. °? A - Arms. Signs are weakness or numbness in an arm. This happens suddenly and usually on one side of the body. °? S - Speech. Signs are sudden trouble speaking, slurred speech, or trouble understanding what people say. °? T - Time. Time to call emergency services. Write down what time symptoms started. °· You have other signs of a stroke, such as: °? A sudden, severe headache with no known cause. °? Nausea or vomiting. °? Seizure. °· You feel sharp pain: °? In one or both sides of your abdomen, between your rib cage and your hips. °? In your middle back, on one or both sides of your spine. °? In your chest. °· You are short of breath. °These symptoms may represent a serious  problem that is an emergency. Do not wait to see if the symptoms will go away. Get medical help right away. Call your local emergency services (911 in the [place] Do not drive yourself to the hospital. °Summary °· Fabry disease is a rare genetic disorder that prevents the breakdown of a type of fat, causing it to build up in blood vessels that are inside of organs. °· This buildup can lead to life-threatening problems, such as heart attack, stroke, or kidney damage. °· Treatment for this condition may include enzyme replacement therapy. This medicine performs the functions of the enzyme that your body does not produce. °· Your health care provider may recommend dietary changes to avoid the fats that your body cannot break down. °This information is not intended to replace advice given to you by your health care provider. Make sure you discuss any questions you have with your health care provider. °Document Released: 05/14/2002 Document Revised: 09/14/2018 Document Reviewed: 05/23/2017 °Elsevier Patient Education © 2020 [employer] ° °

## 2019-03-21 ENCOUNTER — Other Ambulatory Visit: Payer: Self-pay | Admitting: Physical Medicine and Rehabilitation

## 2019-03-21 DIAGNOSIS — Z794 Long term (current) use of insulin: Secondary | ICD-10-CM

## 2019-03-21 DIAGNOSIS — E118 Type 2 diabetes mellitus with unspecified complications: Secondary | ICD-10-CM

## 2019-03-23 ENCOUNTER — Encounter (HOSPITAL_COMMUNITY): Payer: Medicare Other

## 2019-04-06 ENCOUNTER — Encounter: Payer: Self-pay | Admitting: Internal Medicine

## 2019-04-06 ENCOUNTER — Encounter (HOSPITAL_COMMUNITY): Payer: Medicare Other

## 2019-04-16 ENCOUNTER — Telehealth: Payer: Self-pay | Admitting: Emergency Medicine

## 2019-04-16 ENCOUNTER — Telehealth: Payer: Self-pay

## 2019-04-16 NOTE — Telephone Encounter (Signed)
Patient mad aware that PPM is ar RRT and that he will be receiving a call to see DR Allred to schedule a generator change.

## 2019-04-18 NOTE — Telephone Encounter (Signed)
Appt made with JA to discuss gen change

## 2019-04-20 ENCOUNTER — Telehealth: Payer: Self-pay

## 2019-04-20 ENCOUNTER — Encounter: Payer: Self-pay | Admitting: Internal Medicine

## 2019-04-20 ENCOUNTER — Encounter (HOSPITAL_COMMUNITY): Payer: Medicare Other

## 2019-04-20 ENCOUNTER — Other Ambulatory Visit: Payer: Self-pay

## 2019-04-20 ENCOUNTER — Telehealth (INDEPENDENT_AMBULATORY_CARE_PROVIDER_SITE_OTHER): Payer: Medicare Other | Admitting: Internal Medicine

## 2019-04-20 VITALS — Ht 70.0 in | Wt 287.0 lb

## 2019-04-20 DIAGNOSIS — I495 Sick sinus syndrome: Secondary | ICD-10-CM | POA: Diagnosis not present

## 2019-04-20 DIAGNOSIS — I10 Essential (primary) hypertension: Secondary | ICD-10-CM | POA: Diagnosis not present

## 2019-04-20 DIAGNOSIS — I442 Atrioventricular block, complete: Secondary | ICD-10-CM

## 2019-04-20 NOTE — Progress Notes (Signed)
Electrophysiology TeleHealth Note  Due to national recommendations of social distancing due to [name], an audio telehealth visit is felt to be most appropriate for this patient at this time.  Verbal consent was obtained by me for the telehealth visit today.  The patient does not have capability for a virtual visit.  A phone visit is therefore required today.   Date:  04/20/2019   ID:  [name], DOB [birth year], MRN [id number]  Location: patient's home  Provider location:  [facility]  Evaluation Performed: Follow-up visit  PCP:  Ladell Pier, MD   Electrophysiologist:  Dr Rayann Heman  Chief Complaint:  Pacemaker at ERI  History of Present Illness:    [name] is a 58 y.o. male who presents via telehealth conferencing today.  Since last being seen in our clinic, the patient reports doing very well.  Today, he denies symptoms of palpitations, chest pain, shortness of breath,  lower extremity edema, dizziness, presyncope, or syncope.  The patient is otherwise without complaint today.  The patient denies symptoms of fevers, chills, cough, or new SOB worrisome for COVID 19.  Past Medical History:  Diagnosis Date  . Bell's palsy   . Bifascicular block   . Chronic bronchitis ([place])    "seasonal; get it q yr"  . Chronic diastolic CHF (congestive heart failure) ([place]) 2010  . Chronic kidney disease (CKD), stage II (mild)    stage II to III/notes 07/23/2014  . Diastolic heart failure secondary to hypertrophic cardiomyopathy ([place]) CARDIOLOGIST-- DR Daneen Schick  . Dyspnea    increased exertion   . Edema 07/2013  . 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 Gout, arthritis 2014   bil feet. right worse  . History of cellulitis of skin with lymphangitis LEFT LEG  . HOCM (hypertrophic obstructive cardiomyopathy) ([place])    a. Echo 11/16: Severe LVH, EF 55-60%, abnormal GLS consistent with HOCM, no SAM, mild LAE  . Hypertension 20  years  . Lymphedema of lower extremity LEFT  >  RIGHT   "using ankle high socks at home; pump doesn't work for me" (07/23/2014)  . Pacemaker    02/12/11  . Pneumonia 08/2011  . Seasonal asthma NO INHALERS   30 years  . Second degree Mobitz II AV block    with syncope, s/p PPM  . Short of breath on exertion   . Type II diabetes mellitus ([place]) 2012   INSULIN DEPENDENT    Past Surgical History:  Procedure Laterality Date  . CARDIAC CATHETERIZATION  03-12-2011  DR Daneen Schick   HYPERTROPHIC CARDIOMYOPATHY WITH LV CAVITY  APPEARANCE CONSISTANT WITH SIGNIFICANT APICAL HYPERTROPHY/ NORMAL LVSF / EF 55%/ EVIDENCE OF DIASTOLIC DYSFUNCTION WITH EDP OF 23-70mmHg AFTER A-WAVE/ NORMAL CORONARY ARTERIES  . EVALUATION UNDER ANESTHESIA WITH FISTULECTOMY N/A 06/23/2015   Procedure: EXAM UNDER ANESTHESIA WITH POSSIBLE FISTULOTOMY;  Surgeon: Judeth Horn, MD;  Location: [place];  Service: General;  Laterality: N/A;  . HERNIA REPAIR  [id number]   Umbilical Hernia Repair  . INCISION AND DRAINAGE PERIRECTAL ABSCESS Left 07/29/2014   Procedure: IRRIGATION AND DEBRIDEMENT PERIRECTAL ABSCESS;  Surgeon: Doreen Salvage, MD;  Location: [place];  Service: General;  Laterality: Left;  Prone position  . INSERT / REPLACE / REMOVE PACEMAKER  02/12/2011   SJM implanted by Dr Rayann Heman for Mobitz II second degree AV block and syncope  . IRRIGATION AND DEBRIDEMENT ABSCESS N/A 07/27/2013   Procedure: IRRIGATION AND DEBRIDEMENT  OF SKIN, SOFT TISSUE AND MUSCLES OF UPPER BACK (11X19X4cm) WITH 10 BLADE AND PULSATILE LAVAGE ;  Surgeon: Gayland Curry, MD;  Location: [place];  Service: General;  Laterality: N/A;  . UMBILICAL HERNIA REPAIR  03/22/2012   Procedure: HERNIA REPAIR UMBILICAL ADULT;  Surgeon: Leighton Ruff, MD;  Location: [facility];  Service: General;  Laterality: N/A;  Umbilical Hernia Repair     Current Outpatient Medications  Medication Sig Dispense Refill  . Accu-Chek FastClix Lancets MISC 1 each by Other route 3 (three) times daily. 102 each  12  . Agalsidase beta (FABRAZYME IV) Inject 115 mg into the vein every 14 (fourteen) days.     [name] Kitchen albuterol (PROAIR HFA) 108 (90 Base) MCG/ACT inhaler Inhale 1 puff into the lungs every 6 (six) hours as needed for wheezing or shortness of breath. 8.5 Inhaler 1  . allopurinol (ZYLOPRIM) 100 MG tablet Take 3 tablets (300 mg total) by mouth daily. 90 tablet 1  . amLODipine (NORVASC) 10 MG tablet Take 1 tablet (10 mg total) by mouth daily. 30 tablet 6  . atorvastatin (LIPITOR) 80 MG tablet Take 1 tablet (80 mg total) by mouth daily at 6 PM. 30 tablet 6  . clopidogrel (PLAVIX) 75 MG tablet Take 1 tablet (75 mg total) by mouth daily. 30 tablet 6  . fexofenadine (ALLEGRA) 180 MG tablet Take 1 tablet (180 mg total) by mouth daily. 30 tablet 3  . fluticasone (FLONASE) 50 MCG/ACT nasal spray Place 1 spray into both nostrils daily. (Patient taking differently: Place 1 spray into both nostrils as needed for allergies. ) 16 g 1  . furosemide (LASIX) 20 MG tablet Take 1 tablet (20 mg total) by mouth daily. 30 tablet 6  . glucose blood (ACCU-CHEK AVIVA PLUS) test strip 1 each by Other route 3 (three) times daily. Use as instructed 100 each 12  . Insulin Lispro Prot & Lispro (HUMALOG 75/25 MIX) (75-25) 100 UNIT/ML Kwikpen INJECT 20 UNITS INTO THE SKIN 2 TIMES DAILY 15 mL 6  . Insulin Pen Needle (PX SHORTLENGTH PEN NEEDLES) 31G X 8 MM MISC 1 each by Does not apply route 2 (two) times daily before a meal. 100 each 8  . losartan-hydrochlorothiazide (HYZAAR) 50-12.5 MG tablet TAKE 1 TABLET BY MOUTH EVERY DAY 90 tablet 1   No current facility-administered medications for this visit.     Allergies:   Lisinopril and Shellfish allergy   Social History:  The patient  reports that he has never smoked. He has never used smokeless tobacco. He reports current alcohol use. He reports that he does not use drugs.   Family History:  The patient's family history includes Fabry's disease in his brother and mother; Stroke in his  mother.   ROS:  Please see the history of present illness.   All other systems are personally reviewed and negative.    Exam:    Vital Signs:  Ht 5\' 10"  (1.778 m)   Wt 287 lb (130.2 kg)   BMI 41.18 kg/m   Well sounding, alert and conversant   Labs/Other Tests and Data Reviewed:    Recent Labs: 02/13/2019: ALT 26; BUN 30; Creatinine, Ser 1.95; Hemoglobin 15.1; Platelets 221; Potassium 4.7; Sodium 145   Wt Readings from Last 3 Encounters:  04/20/19 287 lb (130.2 kg)  03/09/19 284 lb 12.8 oz (129.2 kg)  03/07/19 284 lb 12.8 oz (129.2 kg)     Last device remote is reviewed    ASSESSMENT & PLAN:  1.  Sinus bradycardia and Complete heart block He has reached RRT.  Normal EF. Risks, benefits, and alternatives to pacemaker pulse generator replacement were discussed in detail today.  The patient understands that risks include but are not limited to bleeding, infection, pneumothorax, perforation, tamponade, vascular damage, renal failure, MI, stroke, death, damage to his existing leads, and lead dislodgement and wishes to proceed.  We will therefore schedule the procedure at the next available time.  He is on plavix for stroke.  We will therefore not hold his plavix.  2. HTN Stable No change required today  3. Chronic diastolic dysfunction Stable No change required today    Patient Risk:  after full review of this patients clinical status, I feel that they are at moderate risk at this time.  Today, I have spent 15 minutes with the patient with telehealth technology discussing arrhythmia management .    Army Fossa, MD  04/20/2019 3:39 PM     [place] [address] [phone] (office) [phone] (fax)

## 2019-04-20 NOTE — Telephone Encounter (Signed)
-----   Message from Thompson Grayer, MD sent at 04/20/2019  3:47 PM EST ----- Ok to schedule pacemaker generator change  We wont hold the plavix

## 2019-04-24 NOTE — Telephone Encounter (Signed)
Pt scheduled for gen change on December 15.  Pt will get lab work at [facility] 04/25/2019.  Orders faxed to [facility] as requested.  Covid test scheduled.  Work up complete.  Instruction letter sent via mychart.

## 2019-04-24 NOTE — Telephone Encounter (Signed)
LMTCB

## 2019-05-04 ENCOUNTER — Encounter (HOSPITAL_COMMUNITY): Payer: Medicare Other

## 2019-05-14 ENCOUNTER — Encounter: Payer: Self-pay | Admitting: Internal Medicine

## 2019-05-16 ENCOUNTER — Telehealth: Payer: Self-pay | Admitting: Adult Health

## 2019-05-16 DIAGNOSIS — R29818 Other symptoms and signs involving the nervous system: Secondary | ICD-10-CM

## 2019-05-16 NOTE — Telephone Encounter (Signed)
As there are no available appointments and if there is concern for recurrent stroke or new stroke, I would advise patient to proceed to ED for further evaluation so there will not be a delay in his care if a new stroke is found

## 2019-05-16 NOTE — Telephone Encounter (Signed)
I called and LMVM for pt that if concern of new stroke, then JM/NP recommended to proceed to ED for evaluation.  I called and spoke to [facility] at Dr. Patrici Ranks office and relayed that I had spoken to pt previously and that per JM/NP that she recommended if concern of new stroke to seek evaluation at ED as no availability here.

## 2019-05-16 NOTE — Telephone Encounter (Signed)
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  He has been doing well until last week in October had double vision come back (using R patched glasses to drive).  Some intermittent balance issues. No other symptons.  Got call from [facility] when had appt today.  Eye pressure good, but ? Another stroke of issue due to return of double vision.  Wanted pt see within a week.  Please advise, (you have nothing).  See;s Dr. Krista Blue for fabray disease. Last seen 08/08/18.  Your last note states prn.  I have not called [facility] back.

## 2019-05-16 NOTE — Telephone Encounter (Signed)
[name] with [facility] called to setup an apt for the patient for within a week due to the patient feeling similar symptoms to when they had a brain stem stroke. [name] that he is also having double vision.    Attempted to schedule apt  however, no available apts within a week with either NP or MD.    Please follow up ([facility] would also like a FU call)

## 2019-05-17 NOTE — Telephone Encounter (Signed)
I called pt and LMVM  and relayed that per JM/NP that you were evaluated for yrly visit at Dr. Zenia Resides office and noted double vision so they were concerned of possible recurrent or new stroke.  Per JM/NP last seen in October for intermittent double vision/ stroke.  She recommended repeating CT to asses for potential new stroke, but feels like it is high chance that you may have similar sx of prior stroke which could be triggered by stress, infection, metabolic imbalances and can be address by pcp.  She did place or for CT and if he has questions to call me back.

## 2019-05-17 NOTE — Telephone Encounter (Signed)
Recent evaluation by ophthalmology with concerns of recurrent or new stroke due to return double vision in October.  He was last seen in this office with intermittent double vision.  Recommend repeating CT head to assess for potential new stroke but high chance of recrudescence of prior stroke symptoms which can sometimes be triggered from increased stressors, infection or metabolic imbalance which can be further assessed by PCP

## 2019-05-17 NOTE — Addendum Note (Signed)
Addended by: Mal Misty on: 05/17/2019 12:15 PM   Modules accepted: Orders

## 2019-05-18 ENCOUNTER — Other Ambulatory Visit (HOSPITAL_COMMUNITY)
Admission: RE | Admit: 2019-05-18 | Discharge: 2019-05-18 | Disposition: A | Discharge status: Home / Self Care | Payer: Medicare Other | Source: Ambulatory Visit | Attending: Internal Medicine | Admitting: Internal Medicine

## 2019-05-18 ENCOUNTER — Encounter (HOSPITAL_COMMUNITY): Payer: Medicare Other

## 2019-05-18 DIAGNOSIS — Z20828 Contact with and (suspected) exposure to other viral communicable diseases: Secondary | ICD-10-CM | POA: Diagnosis not present

## 2019-05-18 DIAGNOSIS — Z01812 Encounter for preprocedural laboratory examination: Secondary | ICD-10-CM | POA: Diagnosis present

## 2019-05-18 NOTE — Telephone Encounter (Signed)
Patient called back in regards to VM and was scheduled next available with Janett Billow on 01/26.   Please follow up.

## 2019-05-19 LAB — NOVEL CORONAVIRUS, NAA (HOSP ORDER, SEND-OUT TO REF LAB; TAT 18-24 HRS): SARS-CoV-2, NAA: NOT DETECTED

## 2019-05-21 ENCOUNTER — Ambulatory Visit (INDEPENDENT_AMBULATORY_CARE_PROVIDER_SITE_OTHER): Payer: Medicare Other | Admitting: *Deleted

## 2019-05-21 DIAGNOSIS — I442 Atrioventricular block, complete: Secondary | ICD-10-CM | POA: Diagnosis not present

## 2019-05-21 LAB — CUP PACEART REMOTE DEVICE CHECK
Battery Remaining Longevity: 0 mo
Battery Voltage: 2.57 V
Brady Statistic AP VP Percent: 60 %
Brady Statistic AP VS Percent: 1 %
Brady Statistic AS VP Percent: 39 %
Brady Statistic AS VS Percent: 1 %
Brady Statistic RA Percent Paced: 59 %
Brady Statistic RV Percent Paced: 99 %
Date Time Interrogation Session: 20201214082613
Implantable Lead Implant Date: 20120907
Implantable Lead Implant Date: 20120907
Implantable Lead Location: 753859
Implantable Lead Location: 753860
Implantable Pulse Generator Implant Date: 20120907
Lead Channel Impedance Value: 290 Ohm
Lead Channel Impedance Value: 400 Ohm
Lead Channel Pacing Threshold Amplitude: 0.625 V
Lead Channel Pacing Threshold Amplitude: 2.375 V
Lead Channel Pacing Threshold Pulse Width: 0.5 ms
Lead Channel Pacing Threshold Pulse Width: 0.5 ms
Lead Channel Sensing Intrinsic Amplitude: 5 mV
Lead Channel Sensing Intrinsic Amplitude: 7.6 mV
Lead Channel Setting Pacing Amplitude: 1.625
Lead Channel Setting Pacing Amplitude: 2.625
Lead Channel Setting Pacing Pulse Width: 0.5 ms
Lead Channel Setting Sensing Sensitivity: 2 mV
Pulse Gen Model: 2210
Pulse Gen Serial Number: 7267950

## 2019-05-21 NOTE — Telephone Encounter (Signed)
I called pt.  Offered appt for 05-23-19 could not come, had another appt.  Made appt for 06-04-19 at 1115 (arrive 1045).  He will proceed with CT as ordered.  He will go to ED if worsening sx.  (stated has some vomiting and balance issues), not sure if related to eyes.  Relayed to JM/NP that made OV for 06-04-19 at 1115.

## 2019-05-22 ENCOUNTER — Ambulatory Visit (HOSPITAL_COMMUNITY)
Admission: RE | Admit: 2019-05-22 | Discharge: 2019-05-23 | Disposition: A | Discharge status: Home / Self Care | Payer: Medicare Other | Attending: Internal Medicine | Admitting: Internal Medicine

## 2019-05-22 ENCOUNTER — Encounter (HOSPITAL_COMMUNITY)
Admission: RE | Disposition: A | Discharge status: Home / Self Care | Payer: Self-pay | Source: Home / Self Care | Attending: Internal Medicine

## 2019-05-22 ENCOUNTER — Other Ambulatory Visit: Payer: Self-pay

## 2019-05-22 ENCOUNTER — Telehealth: Payer: Self-pay | Admitting: Neurology

## 2019-05-22 DIAGNOSIS — Z23 Encounter for immunization: Secondary | ICD-10-CM | POA: Insufficient documentation

## 2019-05-22 DIAGNOSIS — I5032 Chronic diastolic (congestive) heart failure: Secondary | ICD-10-CM | POA: Diagnosis not present

## 2019-05-22 DIAGNOSIS — E1122 Type 2 diabetes mellitus with diabetic chronic kidney disease: Secondary | ICD-10-CM | POA: Insufficient documentation

## 2019-05-22 DIAGNOSIS — J449 Chronic obstructive pulmonary disease, unspecified: Secondary | ICD-10-CM | POA: Insufficient documentation

## 2019-05-22 DIAGNOSIS — I421 Obstructive hypertrophic cardiomyopathy: Secondary | ICD-10-CM | POA: Diagnosis not present

## 2019-05-22 DIAGNOSIS — I442 Atrioventricular block, complete: Secondary | ICD-10-CM | POA: Diagnosis present

## 2019-05-22 DIAGNOSIS — R001 Bradycardia, unspecified: Secondary | ICD-10-CM | POA: Diagnosis not present

## 2019-05-22 DIAGNOSIS — Z7902 Long term (current) use of antithrombotics/antiplatelets: Secondary | ICD-10-CM | POA: Diagnosis not present

## 2019-05-22 DIAGNOSIS — Z794 Long term (current) use of insulin: Secondary | ICD-10-CM | POA: Diagnosis not present

## 2019-05-22 DIAGNOSIS — Z79899 Other long term (current) drug therapy: Secondary | ICD-10-CM | POA: Insufficient documentation

## 2019-05-22 DIAGNOSIS — N182 Chronic kidney disease, stage 2 (mild): Secondary | ICD-10-CM | POA: Insufficient documentation

## 2019-05-22 DIAGNOSIS — M109 Gout, unspecified: Secondary | ICD-10-CM | POA: Insufficient documentation

## 2019-05-22 DIAGNOSIS — Z45010 Encounter for checking and testing of cardiac pacemaker pulse generator [battery]: Secondary | ICD-10-CM | POA: Diagnosis not present

## 2019-05-22 DIAGNOSIS — Z888 Allergy status to other drugs, medicaments and biological substances status: Secondary | ICD-10-CM | POA: Insufficient documentation

## 2019-05-22 DIAGNOSIS — I130 Hypertensive heart and chronic kidney disease with heart failure and stage 1 through stage 4 chronic kidney disease, or unspecified chronic kidney disease: Secondary | ICD-10-CM | POA: Insufficient documentation

## 2019-05-22 HISTORY — PX: PPM GENERATOR CHANGEOUT: EP1233

## 2019-05-22 LAB — BASIC METABOLIC PANEL
Anion gap: 13 (ref 5–15)
BUN: 34 mg/dL — ABNORMAL HIGH (ref 6–20)
CO2: 21 mmol/L — ABNORMAL LOW (ref 22–32)
Calcium: 9.1 mg/dL (ref 8.9–10.3)
Chloride: 106 mmol/L (ref 98–111)
Creatinine, Ser: 2.23 mg/dL — ABNORMAL HIGH (ref 0.61–1.24)
GFR calc Af Amer: 36 mL/min — ABNORMAL LOW (ref >60)
GFR calc non Af Amer: 31 mL/min — ABNORMAL LOW (ref >60)
Glucose, Bld: 138 mg/dL — ABNORMAL HIGH (ref 70–99)
Potassium: 6.6 mmol/L (ref 3.5–5.1)
Sodium: 140 mmol/L (ref 135–145)

## 2019-05-22 LAB — POCT I-STAT, CHEM 8
BUN: 36 mg/dL — ABNORMAL HIGH (ref 6–20)
Calcium, Ion: 1.16 mmol/L (ref 1.15–1.40)
Chloride: 107 mmol/L (ref 98–111)
Creatinine, Ser: 2 mg/dL — ABNORMAL HIGH (ref 0.61–1.24)
Glucose, Bld: 133 mg/dL — ABNORMAL HIGH (ref 70–99)
HCT: 49 % (ref 39.0–52.0)
Hemoglobin: 16.7 g/dL (ref 13.0–17.0)
Potassium: 4 mmol/L (ref 3.5–5.1)
Sodium: 142 mmol/L (ref 135–145)
TCO2: 27 mmol/L (ref 22–32)

## 2019-05-22 LAB — CBC
HCT: 48.3 % (ref 39.0–52.0)
Hemoglobin: 15.5 g/dL (ref 13.0–17.0)
MCH: 29.3 pg (ref 26.0–34.0)
MCHC: 32.1 g/dL (ref 30.0–36.0)
MCV: 91.3 fL (ref 80.0–100.0)
Platelets: 193 10*3/uL (ref 150–400)
RBC: 5.29 MIL/uL (ref 4.22–5.81)
RDW: 13.3 % (ref 11.5–15.5)
WBC: 7 10*3/uL (ref 4.0–10.5)
nRBC: 0 % (ref 0.0–0.2)

## 2019-05-22 LAB — GLUCOSE, CAPILLARY
Glucose-Capillary: 136 mg/dL — ABNORMAL HIGH (ref 70–99)
Glucose-Capillary: 85 mg/dL (ref 70–99)
Glucose-Capillary: 240 mg/dL — ABNORMAL HIGH (ref 70–99)
Glucose-Capillary: 130 mg/dL — ABNORMAL HIGH (ref 70–99)

## 2019-05-22 SURGERY — PPM GENERATOR CHANGEOUT

## 2019-05-22 MED ORDER — CLOPIDOGREL BISULFATE 75 MG PO TABS
75.0000 mg | ORAL_TABLET | Freq: Every day | ORAL | Status: DC
  Administered 2019-05-23: 10:00:00 75 mg via ORAL
  Filled 2019-05-22: qty 1

## 2019-05-22 MED ORDER — HYDROCHLOROTHIAZIDE 12.5 MG PO CAPS
12.5000 mg | ORAL_CAPSULE | Freq: Every day | ORAL | Status: DC
  Administered 2019-05-23: 12.5 mg via ORAL
  Filled 2019-05-22: qty 1

## 2019-05-22 MED ORDER — SODIUM CHLORIDE 0.9 % IV SOLN
INTRAVENOUS | Status: AC
  Filled 2019-05-22: qty 2

## 2019-05-22 MED ORDER — SODIUM CHLORIDE 0.9% FLUSH: 3.0000 mL | INTRAVENOUS | Status: DC | PRN

## 2019-05-22 MED ORDER — PNEUMOCOCCAL VAC POLYVALENT 25 MCG/0.5ML IJ INJ
0.5000 mL | INJECTION | INTRAMUSCULAR | Status: AC
  Administered 2019-05-23: 0.5 mL via INTRAMUSCULAR
  Filled 2019-05-22: qty 0.5

## 2019-05-22 MED ORDER — SODIUM CHLORIDE 0.9 % IV SOLN
80.0000 mg | INTRAVENOUS | Status: AC
  Administered 2019-05-22: 80 mg

## 2019-05-22 MED ORDER — SODIUM CHLORIDE 0.9 % IV SOLN: INTRAVENOUS | Status: DC

## 2019-05-22 MED ORDER — INSULIN ASPART PROT & ASPART (70-30 MIX) 100 UNIT/ML ~~LOC~~ SUSP
20.0000 [IU] | Freq: Two times a day (BID) | SUBCUTANEOUS | Status: DC
  Administered 2019-05-22: 20 [IU] via SUBCUTANEOUS
  Filled 2019-05-22 (×2): qty 10

## 2019-05-22 MED ORDER — MIDAZOLAM HCL 5 MG/5ML IJ SOLN
INTRAMUSCULAR | Status: AC
  Filled 2019-05-22: qty 5

## 2019-05-22 MED ORDER — FENTANYL CITRATE (PF) 100 MCG/2ML IJ SOLN
INTRAMUSCULAR | Status: AC
  Filled 2019-05-22: qty 2

## 2019-05-22 MED ORDER — LIDOCAINE HCL (PF) 1 % IJ SOLN
INTRAMUSCULAR | Status: AC
  Filled 2019-05-22: qty 60

## 2019-05-22 MED ORDER — SODIUM CHLORIDE 0.9% FLUSH
3.0000 mL | Freq: Two times a day (BID) | INTRAVENOUS | Status: DC
  Administered 2019-05-22: 3 mL via INTRAVENOUS

## 2019-05-22 MED ORDER — INSULIN ASPART 100 UNIT/ML ~~LOC~~ SOLN: 0.0000 [IU] | Freq: Three times a day (TID) | SUBCUTANEOUS | Status: DC

## 2019-05-22 MED ORDER — CHLORHEXIDINE GLUCONATE 4 % EX LIQD
4.0000 "application " | Freq: Once | CUTANEOUS | Status: DC
  Filled 2019-05-22: qty 60

## 2019-05-22 MED ORDER — MIDAZOLAM HCL 5 MG/5ML IJ SOLN
INTRAMUSCULAR | Status: DC | PRN
  Administered 2019-05-22: 1 mg via INTRAVENOUS

## 2019-05-22 MED ORDER — LOSARTAN POTASSIUM 50 MG PO TABS
50.0000 mg | ORAL_TABLET | Freq: Every day | ORAL | Status: DC
  Administered 2019-05-23: 50 mg via ORAL
  Filled 2019-05-22: qty 1

## 2019-05-22 MED ORDER — ALLOPURINOL 300 MG PO TABS
300.0000 mg | ORAL_TABLET | Freq: Every day | ORAL | Status: DC
  Administered 2019-05-23: 300 mg via ORAL
  Filled 2019-05-22: qty 1

## 2019-05-22 MED ORDER — ALBUTEROL SULFATE HFA 108 (90 BASE) MCG/ACT IN AERS: 1.0000 | INHALATION_SPRAY | Freq: Four times a day (QID) | RESPIRATORY_TRACT | Status: DC | PRN

## 2019-05-22 MED ORDER — SODIUM CHLORIDE 0.9 % IV SOLN
250.0000 mL | INTRAVENOUS | Status: DC | PRN
  Administered 2019-05-22 – 2019-05-23 (×2): 250 mL via INTRAVENOUS

## 2019-05-22 MED ORDER — INFLUENZA VAC SPLIT QUAD 0.5 ML IM SUSY
0.5000 mL | PREFILLED_SYRINGE | INTRAMUSCULAR | Status: AC
  Administered 2019-05-23: 0.5 mL via INTRAMUSCULAR
  Filled 2019-05-22: qty 0.5

## 2019-05-22 MED ORDER — HYDROCODONE-ACETAMINOPHEN 5-325 MG PO TABS: 1.0000 | ORAL_TABLET | ORAL | Status: DC | PRN

## 2019-05-22 MED ORDER — LOSARTAN POTASSIUM-HCTZ 50-12.5 MG PO TABS: 1.0000 | ORAL_TABLET | Freq: Every day | ORAL | Status: DC

## 2019-05-22 MED ORDER — LIDOCAINE HCL (PF) 1 % IJ SOLN
INTRAMUSCULAR | Status: DC | PRN
  Administered 2019-05-22: 60 mL

## 2019-05-22 MED ORDER — ALBUTEROL SULFATE (2.5 MG/3ML) 0.083% IN NEBU: 2.5000 mg | INHALATION_SOLUTION | Freq: Four times a day (QID) | RESPIRATORY_TRACT | Status: DC | PRN

## 2019-05-22 MED ORDER — ACETAMINOPHEN 325 MG PO TABS: 325.0000 mg | ORAL_TABLET | ORAL | Status: DC | PRN

## 2019-05-22 MED ORDER — FENTANYL CITRATE (PF) 100 MCG/2ML IJ SOLN
INTRAMUSCULAR | Status: DC | PRN
  Administered 2019-05-22: 12.5 ug via INTRAVENOUS

## 2019-05-22 MED ORDER — DEXTROSE 5 % IV SOLN
3.0000 g | INTRAVENOUS | Status: AC
  Administered 2019-05-22: 3 g via INTRAVENOUS
  Filled 2019-05-22: qty 3000

## 2019-05-22 MED ORDER — CEFAZOLIN SODIUM-DEXTROSE 1-4 GM/50ML-% IV SOLN
1.0000 g | Freq: Four times a day (QID) | INTRAVENOUS | Status: AC
  Administered 2019-05-22 – 2019-05-23 (×3): 1 g via INTRAVENOUS
  Filled 2019-05-22 (×3): qty 50

## 2019-05-22 MED ORDER — AMLODIPINE BESYLATE 10 MG PO TABS
10.0000 mg | ORAL_TABLET | Freq: Every day | ORAL | Status: DC
  Administered 2019-05-23: 10 mg via ORAL
  Filled 2019-05-22: qty 1

## 2019-05-22 MED ORDER — FUROSEMIDE 20 MG PO TABS
20.0000 mg | ORAL_TABLET | Freq: Every day | ORAL | Status: DC
  Administered 2019-05-23: 20 mg via ORAL
  Filled 2019-05-22: qty 1

## 2019-05-22 MED ORDER — ONDANSETRON HCL 4 MG/2ML IJ SOLN: 4.0000 mg | Freq: Four times a day (QID) | INTRAMUSCULAR | Status: DC | PRN

## 2019-05-22 SURGICAL SUPPLY — 4 items
CABLE SURGICAL S-101-97-12 (CABLE) ×2 IMPLANT
PACEMAKER ASSURITY DR-RF (Pacemaker) ×2 IMPLANT
PAD PRO RADIOLUCENT 2001M-C (PAD) ×2 IMPLANT
TRAY PACEMAKER INSERTION (PACKS) ×2 IMPLANT

## 2019-05-22 NOTE — Progress Notes (Signed)
Patient had blood in his stool. RN did not get to see it before it was flushed. Will continue to monitor.

## 2019-05-22 NOTE — Telephone Encounter (Signed)
I have reviewed evaluation by ophthalmologist Dr. Duanne Guess, patient has end gaze nystagmus on left gaze,  Febry disease, type 2 diabetes with no ocular complications, nuclear sclerosis, bilaterally, hypertensive retinopathy,  He is scheduled with stroke nurse practitioner Janett Billow on June 04, 2019

## 2019-05-22 NOTE — Plan of Care (Signed)
  Problem: Education: Goal: Knowledge of General Education information will improve Description Including pain rating scale, medication(s)/side effects and non-pharmacologic comfort measures Outcome: Progressing   Problem: Health Behavior/Discharge Planning: Goal: Ability to manage health-related needs will improve Outcome: Progressing   

## 2019-05-22 NOTE — Discharge Summary (Signed)
ELECTROPHYSIOLOGY PROCEDURE DISCHARGE SUMMARY    [name],  MRN: [id number], DOB/AGE: August 06, 1960 58 y.o.  Admit date: 05/22/2019 Discharge date: 05/23/19   Primary Care Physician: Ladell Pier, MD  Primary EP: Dr. Rayann Heman  Primary Discharge Diagnosis:  Pacemaker at [facility]  Secondary Discharge Diagnosis Complete Heart Block Sinus bradycardia  Allergies  Allergen Reactions  . Lisinopril Anaphylaxis  . Shellfish Allergy Hives and Swelling    Procedures This Admission:  1.  Generator change-out of a St. Jude PPM on 05/22/2019 by Dr. Rayann Heman. Pt received a St. Jude model number [id number].There were no immediate post procedure complications  Brief HPI:  [name] is a 58 y.o. male seen by Electrophysiology in the outpatient setting for consideration of generator change out due to reaching ERI. Past medical history includes above. Risk, benefits, and alternatives to implantable cardiac device system revision were reviewed with the patient who wished to proceed.   Hospital Course:  The patient was admitted and underwent generator change to a a St. Jude dual chamber PPM with details as outlined above.  he was monitored on telemetry overnight which demonstrated V pacing in 60-90 range. Left chest was without hematoma or ecchymosis.  The device was interrogated post op and found to be functioning normally. Wound care, arm mobility, and restrictions were reviewed with the patient. Pt did not have a ride available 12/15, so was observed overnight. The patient was examined and considered stable for discharge to home with close follow up as below.   Physical Exam: Vitals:   05/23/19 0007 05/23/19 0009 05/23/19 0314 05/23/19 0550  BP: (!) 141/87   136/84  Pulse: 94 68  69  Resp: 20   19  Temp: 98.2 F (36.8 C)   98.5 F (36.9 C)  TempSrc: Oral   Oral  SpO2: (!) 81% 100%  100%  Weight:   127.4 kg   Height:        GEN- The patient is well appearing, alert  and oriented x 3 today.   HEENT: normocephalic, atraumatic; sclera clear, conjunctiva pink; hearing intact; oropharynx clear; neck supple, no JVP Lymph- no cervical lymphadenopathy Lungs- Clear to ausculation bilaterally, normal work of breathing.  No wheezes, rales, rhonchi Heart- Regular rate and rhythm, no murmurs, rubs or gallops, PMI not laterally displaced GI- soft, non-tender, non-distended, bowel sounds present, no hepatosplenomegaly Extremities- no clubbing, cyanosis, or edema; DP/PT/radial pulses 2+ bilaterally MS- no significant deformity or atrophy Skin- warm and dry, no rash or lesion Psych- euthymic mood, full affect Neuro- strength and sensation are intact    Labs:   Lab Results  Component Value Date   WBC 7.0 05/22/2019   HGB 16.7 05/22/2019   HCT 49.0 05/22/2019   MCV 91.3 05/22/2019   PLT 193 05/22/2019    Recent Labs  Lab 05/22/19 1201 05/22/19 1321  NA 140 142  K 6.6* 4.0  CL 106 107  CO2 21*  --   BUN 34* 36*  CREATININE 2.23* 2.00*  CALCIUM 9.1  --   GLUCOSE 138* 133*     Discharge Medications:  Allergies as of 05/23/2019      Reactions   Lisinopril Anaphylaxis   Shellfish Allergy Hives, Swelling      Medication List    TAKE these medications   Accu-Chek Aviva Plus test strip Generic drug: glucose blood 1 each by Other route 3 (three) times daily. Use as instructed   Accu-Chek FastClix Lancets Misc 1 each  by Other route 3 (three) times daily.   acetaminophen 325 MG tablet Commonly known as: TYLENOL Take 1-2 tablets (325-650 mg total) by mouth every 4 (four) hours as needed for mild pain.   albuterol 108 (90 Base) MCG/ACT inhaler Commonly known as: ProAir HFA Inhale 1 puff into the lungs every 6 (six) hours as needed for wheezing or shortness of breath.   allopurinol 100 MG tablet Commonly known as: ZYLOPRIM Take 3 tablets (300 mg total) by mouth daily.   amLODipine 10 MG tablet Commonly known as: NORVASC Take 1 tablet (10 mg  total) by mouth daily.   atorvastatin 20 MG tablet Commonly known as: LIPITOR Take 20 mg by mouth daily.   atorvastatin 80 MG tablet Commonly known as: LIPITOR Take 1 tablet (80 mg total) by mouth daily at 6 PM.   clopidogrel 75 MG tablet Commonly known as: PLAVIX Take 1 tablet (75 mg total) by mouth daily.   FABRAZYME IV Inject 115 mg into the vein every 14 (fourteen) days.   fexofenadine 180 MG tablet Commonly known as: ALLEGRA Take 1 tablet (180 mg total) by mouth daily.   fluticasone 50 MCG/ACT nasal spray Commonly known as: FLONASE Place 1 spray into both nostrils daily. What changed:   when to take this  reasons to take this   furosemide 20 MG tablet Commonly known as: LASIX Take 1 tablet (20 mg total) by mouth daily.   Insulin Lispro Prot & Lispro (75-25) 100 UNIT/ML Kwikpen Commonly known as: HUMALOG 75/25 MIX INJECT 20 UNITS INTO THE SKIN 2 TIMES DAILY What changed:   how much to take  how to take this  when to take this  additional instructions   Insulin Pen Needle 31G X 8 MM Misc Commonly known as: PX Shortlength Pen Needles 1 each by Does not apply route 2 (two) times daily before a meal.   losartan-hydrochlorothiazide 50-12.5 MG tablet Commonly known as: HYZAAR TAKE 1 TABLET BY MOUTH EVERY DAY   OLIVE LEAF EXTRACT PO Take 1 capsule by mouth daily.       Disposition: Pt is being discharged home today in good condition.  Follow-up Information    Thompson Grayer, MD Follow up on 08/22/2019.   Specialty: Cardiology Why: at 215 pm for 3 month post generator change Contact information: [address] [place] [address] [phone]        [facility] Follow up on 06/05/2019.   Why: at 1100 am for post generator change wound check. Contact information: [place] [address] [phone]          Duration of Discharge Encounter:  Greater than 30 minutes including physician time.  Jacalyn Lefevre, PA-C  05/23/2019 7:22 AM

## 2019-05-22 NOTE — Discharge Instructions (Signed)

## 2019-05-22 NOTE — H&P (Signed)
Chief Complaint:  Pacemaker at ERI  History of Present Illness:    [name] is a 58 y.o. male who presents for pacemaker generator change.  Since last being seen in our clinic, the patient reports doing very well.  Today, he denies symptoms of palpitations, chest pain, shortness of breath,  lower extremity edema, dizziness, presyncope, or syncope.  The patient is otherwise without complaint today.  The patient denies symptoms of fevers, chills, cough, or new SOB worrisome for COVID 19.      Past Medical History:  Diagnosis Date  . Bell's palsy   . Bifascicular block   . Chronic bronchitis ([place])    "seasonal; get it q yr"  . Chronic diastolic CHF (congestive heart failure) ([place]) 2010  . Chronic kidney disease (CKD), stage II (mild)    stage II to III/notes 07/23/2014  . Diastolic heart failure secondary to hypertrophic cardiomyopathy ([place]) CARDIOLOGIST-- DR Daneen Schick  . Dyspnea    increased exertion   . Edema 07/2013  . 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 Gout, arthritis 2014   bil feet. right worse  . History of cellulitis of skin with lymphangitis LEFT LEG  . HOCM (hypertrophic obstructive cardiomyopathy) ([place])    a. Echo 11/16: Severe LVH, EF 55-60%, abnormal GLS consistent with HOCM, no SAM, mild LAE  . Hypertension 20 years  . Lymphedema of lower extremity LEFT  >  RIGHT   "using ankle high socks at home; pump doesn't work for me" (07/23/2014)  . Pacemaker    02/12/11  . Pneumonia 08/2011  . Seasonal asthma NO INHALERS   30 years  . Second degree Mobitz II AV block    with syncope, s/p PPM  . Short of breath on exertion   . Type II diabetes mellitus ([place]) 2012   INSULIN DEPENDENT         Past Surgical History:  Procedure Laterality Date  . CARDIAC CATHETERIZATION  03-12-2011  DR Daneen Schick   HYPERTROPHIC CARDIOMYOPATHY WITH LV CAVITY  APPEARANCE CONSISTANT WITH SIGNIFICANT APICAL HYPERTROPHY/  NORMAL LVSF / EF 55%/ EVIDENCE OF DIASTOLIC DYSFUNCTION WITH EDP OF 23-62mmHg AFTER A-WAVE/ NORMAL CORONARY ARTERIES  . EVALUATION UNDER ANESTHESIA WITH FISTULECTOMY N/A 06/23/2015   Procedure: EXAM UNDER ANESTHESIA WITH POSSIBLE FISTULOTOMY;  Surgeon: Judeth Horn, MD;  Location: [facility];  Service: General;  Laterality: N/A;  . HERNIA REPAIR  [id number]   Umbilical Hernia Repair  . INCISION AND DRAINAGE PERIRECTAL ABSCESS Left 07/29/2014   Procedure: IRRIGATION AND DEBRIDEMENT PERIRECTAL ABSCESS;  Surgeon: Doreen Salvage, MD;  Location: [facility];  Service: General;  Laterality: Left;  Prone position  . INSERT / REPLACE / REMOVE PACEMAKER  02/12/2011   SJM implanted by Dr Rayann Heman for Mobitz II second degree AV block and syncope  . IRRIGATION AND DEBRIDEMENT ABSCESS N/A 07/27/2013   Procedure: IRRIGATION AND DEBRIDEMENT OF SKIN, SOFT TISSUE AND MUSCLES OF UPPER BACK (11X19X4cm) WITH 10 BLADE AND PULSATILE LAVAGE ;  Surgeon: Gayland Curry, MD;  Location: [facility];  Service: General;  Laterality: N/A;  . UMBILICAL HERNIA REPAIR  03/22/2012   Procedure: HERNIA REPAIR UMBILICAL ADULT;  Surgeon: Leighton Ruff, MD;  Location: [facility];  Service: General;  Laterality: N/A;  Umbilical Hernia Repair           Current Outpatient Medications  Medication Sig Dispense Refill  . Accu-Chek FastClix Lancets MISC 1 each by Other route 3 (three) times daily. 102 each  12  . Agalsidase beta (FABRAZYME IV) Inject 115 mg into the vein every 14 (fourteen) days.     [name] Kitchen albuterol (PROAIR HFA) 108 (90 Base) MCG/ACT inhaler Inhale 1 puff into the lungs every 6 (six) hours as needed for wheezing or shortness of breath. 8.5 Inhaler 1  . allopurinol (ZYLOPRIM) 100 MG tablet Take 3 tablets (300 mg total) by mouth daily. 90 tablet 1  . amLODipine (NORVASC) 10 MG tablet Take 1 tablet (10 mg total) by mouth daily. 30 tablet 6  . atorvastatin (LIPITOR) 80 MG tablet Take 1 tablet (80 mg total) by mouth daily at 6 PM. 30 tablet 6  . clopidogrel  (PLAVIX) 75 MG tablet Take 1 tablet (75 mg total) by mouth daily. 30 tablet 6  . fexofenadine (ALLEGRA) 180 MG tablet Take 1 tablet (180 mg total) by mouth daily. 30 tablet 3  . fluticasone (FLONASE) 50 MCG/ACT nasal spray Place 1 spray into both nostrils daily. (Patient taking differently: Place 1 spray into both nostrils as needed for allergies. ) 16 g 1  . furosemide (LASIX) 20 MG tablet Take 1 tablet (20 mg total) by mouth daily. 30 tablet 6  . glucose blood (ACCU-CHEK AVIVA PLUS) test strip 1 each by Other route 3 (three) times daily. Use as instructed 100 each 12  . Insulin Lispro Prot & Lispro (HUMALOG 75/25 MIX) (75-25) 100 UNIT/ML Kwikpen INJECT 20 UNITS INTO THE SKIN 2 TIMES DAILY 15 mL 6  . Insulin Pen Needle (PX SHORTLENGTH PEN NEEDLES) 31G X 8 MM MISC 1 each by Does not apply route 2 (two) times daily before a meal. 100 each 8  . losartan-hydrochlorothiazide (HYZAAR) 50-12.5 MG tablet TAKE 1 TABLET BY MOUTH EVERY DAY 90 tablet 1   No current facility-administered medications for this visit.     Allergies:   Lisinopril and Shellfish allergy   Social History:  The patient  reports that he has never smoked. He has never used smokeless tobacco. He reports current alcohol use. He reports that he does not use drugs.   Family History:  The patient's family history includes Fabry's disease in his brother and mother; Stroke in his mother.   ROS:  Please see the history of present illness.   All other systems are personally reviewed and negative.   Physical Exam: Vitals:   05/22/19 1158  BP: 128/80  Pulse: 69  Resp: 20  Temp: 97.9 F (36.6 C)  TempSrc: Skin  SpO2: 100%  Weight: 127 kg  Height: 5\' 10"  (1.778 m)    GEN- The patient is well appearing, alert and oriented x 3 today.   Head- normocephalic, atraumatic Eyes-  Sclera clear, conjunctiva pink Ears- hearing intact Oropharynx- clear Neck- supple, Lungs-  normal work of breathing Heart- Regular rate and rhythm  d GI- sof      Labs/Other Tests and Data Reviewed:    Recent Labs: 02/13/2019: ALT 26; BUN 30; Creatinine, Ser 1.95; Hemoglobin 15.1; Platelets 221; Potassium 4.7; Sodium 145   Wt Readings from Last 3 Encounters:  04/20/19 287 lb (130.2 kg)  03/09/19 284 lb 12.8 oz (129.2 kg)  03/07/19 284 lb 12.8 oz (129.2 kg)     ASSESSMENT & PLAN:    1.  Sinus bradycardia and Complete heart block He has reached RRT.  Normal EF. Risks, benefits, and alternatives to pacemaker pulse generator replacement were discussed in detail today.  The patient understands that risks include but are not limited to bleeding, infection, pneumothorax, perforation, tamponade, vascular  damage, renal failure, MI, stroke, death, damage to his existing leads, and lead dislodgement and wishes to proceed.    He did not bring a driver and lives alone.  He is very concerned about not having assistance tonight post operatively.  We will therefore plan overnight observation.  Thompson Grayer MD, [place] 05/22/2019 12:42 PM

## 2019-05-23 DIAGNOSIS — I130 Hypertensive heart and chronic kidney disease with heart failure and stage 1 through stage 4 chronic kidney disease, or unspecified chronic kidney disease: Secondary | ICD-10-CM | POA: Diagnosis not present

## 2019-05-23 DIAGNOSIS — Z45010 Encounter for checking and testing of cardiac pacemaker pulse generator [battery]: Secondary | ICD-10-CM | POA: Diagnosis not present

## 2019-05-23 DIAGNOSIS — I442 Atrioventricular block, complete: Secondary | ICD-10-CM | POA: Diagnosis not present

## 2019-05-23 DIAGNOSIS — Z23 Encounter for immunization: Secondary | ICD-10-CM | POA: Diagnosis not present

## 2019-05-23 LAB — GLUCOSE, CAPILLARY
Glucose-Capillary: 69 mg/dL — ABNORMAL LOW (ref 70–99)
Glucose-Capillary: 88 mg/dL (ref 70–99)

## 2019-05-23 MED ORDER — ACETAMINOPHEN 325 MG PO TABS: 325.0000 mg | ORAL_TABLET | ORAL | Status: AC | PRN

## 2019-05-25 ENCOUNTER — Encounter: Payer: Self-pay | Admitting: Internal Medicine

## 2019-05-29 NOTE — Progress Notes (Signed)
[facility] [address] [place]. [place] [address] [phone]       OFFICE FOLLOW UP NOTE  [name] Date of Birth:  [birth year] Medical Record Number:  [id number]   Reason for visit: Stroke follow-up   CHIEF COMPLAINT:  Chief Complaint  Patient presents with  . Follow-up    3 mon f/u. Alone. Rm 9. Patient mentioned that he feels like his stroke symptoms are coming back.     HPI: Stroke admission 10/19/2018: [name] is a 58 y.o. male with history of diabetes, second-degree Mobitz with AV block requiring a pacemaker which is not MRI compatible, lymphedema in the lower extremities, HOCM, Fabry'sdisease, diastolic heart failure, chronic kidney disease, heart failure, and Bell's palsy  who presented on 10/19/2018 with headache, left facial paresthesias and pain behind his eyes.  Stroke work-up likely brainstem lacunar infarct secondary to small vessel disease too small to be seen on imaging could be related to Fabry's disease.  CT head did show old left pontine lacune which was new finding since prior imaging in 2012.  TCD showed diffuse intracranial atherosclerosis.  Carotid Doppler and 2D echo unremarkable.  HIV negative.  UDS negative.  LDL 161 and A1c 7.4.  Recommended DAPT for 3 weeks and Plavix alone.  He continues to follow with Dr. Krista [name] at [facility] for ongoing management of Fabry's disease and recommended holding Fabrazyme in setting of acute illness and to resume 2 weeks prior nephrology input.  HTN stable.  Increase atorvastatin dose from 20 mg to 80 mg for HLD management.  Ongoing follow-up with PCP for DM management.  Other stroke risk factors include EtOH use, morbid obesity, family history of stroke, chronic diastolic CHF and hypertrophic obstructive cardiomyopathy with EF of 55 to 60%.  He was also found to have staph UTI and was treated with ceftriaxone.  Residual deficit of ataxia with diplopia and balance deficits and therefore discharged to  [facility] for ongoing therapy.  He was discharged home on 11/02/2018.   Virtual visit 12/04/2018: Residual deficits of mild gait difficulties, left cheek numbness sensation and blurred vision upon awakening but improved through out the day - therapies completed after 2 visits - he also felt as though this was not beneficial for him as he was having them do exercises he learned during admission which he continued to do on his own at home He lives independently where he has returned back to all prior ADLs - he has been retired since 2013; he has not returned to driving at this time due to diplopia - he has not followed with ophthalmology at this time Completed 3 weeks DAPT and continues on Plavix alone without side effects of bleeding or bruising Continues on atorvastatin 80 mg daily without side effects myalgias Blood pressure - plans on obtaining his own cuff to monitor at home  He has resumed Fabrazyme on 11/17/2018 No further concerns at this time  Update 03/07/2019: [name] is being seen today for stroke follow-up.  He has been doing well form a stroke standpoint with only intermittent diplopia which typically worsens with bright light.  Continues to use cane for occasional fatigue/"winded" with prolonged ambulation but this has been stable without worsening. He continues to do HEP at home routinely. Continues on Plavix and atorvastatin for secondary stroke prevention without side effects.  Glucose levels have been stable.  Blood pressure today satisfactory at 122/83.  Denies new or worsening stroke/TIA symptoms. He continues to receive Fabrazyme infusions  for Fabry disease and follows with Dr. Krista [name] routinely.   Update 06/04/2019: [name] is a 58 year old who is being seen today for stroke follow up as well as request by opthomology due to worsening diplopia.  Patient states he had acute onset of worsening double vision, imbalance and vertigo on 04/06/2019.  He was previously recovering well slowly  returning back to prior activities.  He was evaluated with ophthalmology and concern for possible new stroke.  He unfortunately did have a fall on Christmas Eve when he went to stand up from a chair but went straight forward falling onto his face.  He reports he had a sensation as though he was being pushed down or pulled towards his left side.  He denies any injury, headache or worsening/new neurological symptoms.  He was advised by his PCP to be evaluated in the ER immediately but this had not been done for unknown reasons.  He does report improvement of vision since that time but has concerns regarding continued imbalance and vertigo.  He continues to follow with Dr. Krista [name] in his office for history of Fabrys disease receiving Fabrazyme infusions twice weekly.  He plans on undergoing CT head tomorrow to rule out possible new or worsening stroke.  He denies any increased stress, infections, illness, medication changes or trauma that occurred prior to worsening of prior symptoms.  Blood pressure today 122/74.  He has continued on Plavix and atorvastatin for secondary stroke prevention without side effects.  Glucose levels have been slightly elevated over the past 2 weeks.  He continues to follow PCP for HTN, HLD and DM.  No further concerns at this time.     ROS:   14 system review of systems performed and negative with exception of diplopia, balance difficulties, lymphedema, vertigo, gait difficulty   PMH:  Past Medical History:  Diagnosis Date  . Bell's palsy   . Bifascicular block   . Chronic bronchitis ([place])    "seasonal; get it q yr"  . Chronic diastolic CHF (congestive heart failure) ([place]) 2010  . Chronic kidney disease (CKD), stage II (mild)    stage II to III/notes 07/23/2014  . Diastolic heart failure secondary to hypertrophic cardiomyopathy ([place]) CARDIOLOGIST-- DR [name]  . Dyspnea    increased exertion   . Edema 07/2013  . Fabry's disease ([place]) RENAL AND CARDIAC INVOVLEMENT    FOLLOWED DR [place]  . Gout, arthritis 2014   bil feet. right worse  . History of cellulitis of skin with lymphangitis LEFT LEG  . HOCM (hypertrophic obstructive cardiomyopathy) ([place])    a. Echo 11/16: Severe LVH, EF 55-60%, abnormal GLS consistent with HOCM, no SAM, mild LAE  . Hypertension 20 years  . Lymphedema of lower extremity LEFT  >  RIGHT   "using ankle high socks at home; pump doesn't work for me" (07/23/2014)  . Pacemaker    02/12/11  . Pneumonia 08/2011  . Seasonal asthma NO INHALERS   30 years  . Second degree Mobitz II AV block    with syncope, s/p PPM  . Short of breath on exertion   . Type II diabetes mellitus ([place]) 2012   INSULIN DEPENDENT    PSH:  Past Surgical History:  Procedure Laterality Date  . CARDIAC CATHETERIZATION  03-12-2011  DR [name]   HYPERTROPHIC CARDIOMYOPATHY WITH LV CAVITY  APPEARANCE CONSISTANT WITH SIGNIFICANT APICAL HYPERTROPHY/ NORMAL LVSF / EF 55%/ EVIDENCE OF DIASTOLIC DYSFUNCTION WITH EDP OF 23-22mmHg AFTER A-WAVE/ NORMAL CORONARY ARTERIES  .  EVALUATION UNDER ANESTHESIA WITH FISTULECTOMY N/A 06/23/2015   Procedure: EXAM UNDER ANESTHESIA WITH POSSIBLE FISTULOTOMY;  Surgeon: Judeth Horn, MD;  Location: [facility];  Service: General;  Laterality: N/A;  . HERNIA REPAIR  1308   Umbilical Hernia Repair  . INCISION AND DRAINAGE PERIRECTAL ABSCESS Left 07/29/2014   Procedure: IRRIGATION AND DEBRIDEMENT PERIRECTAL ABSCESS;  Surgeon: Doreen Salvage, MD;  Location: [facility];  Service: General;  Laterality: Left;  Prone position  . INSERT / REPLACE / REMOVE PACEMAKER  02/12/2011   SJM implanted by Dr Rayann Heman for Mobitz II second degree AV block and syncope  . IRRIGATION AND DEBRIDEMENT ABSCESS N/A 07/27/2013   Procedure: IRRIGATION AND DEBRIDEMENT OF SKIN, SOFT TISSUE AND MUSCLES OF UPPER BACK (11X19X4cm) WITH 10 BLADE AND PULSATILE LAVAGE ;  Surgeon: Gayland Curry, MD;  Location: [facility];  Service: General;  Laterality: N/A;  . PPM GENERATOR CHANGEOUT N/A 05/22/2019     Procedure: PPM GENERATOR CHANGEOUT;  Surgeon: Thompson Grayer, MD;  Location: [facility];  Service: Cardiovascular;  Laterality: N/A;  . UMBILICAL HERNIA REPAIR  03/22/2012   Procedure: HERNIA REPAIR UMBILICAL ADULT;  Surgeon: Leighton Ruff, MD;  Location: [facility];  Service: General;  Laterality: N/A;  Umbilical Hernia Repair     Social History:  Social History   Socioeconomic History  . Marital status: Divorced    Spouse name: Not on file  . Number of children: Not on file  . Years of education: Not on file  . Highest education level: Not on file  Occupational History  . Not on file  Tobacco Use  . Smoking status: Never Smoker  . Smokeless tobacco: Never Used  Substance and Sexual Activity  . Alcohol use: Yes    Comment: 07/23/2014 "might have a few drinks on holidays or at cookouts"  . Drug use: No  . Sexual activity: Yes  Other Topics Concern  . 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 Difficulty of Paying Living Expenses: Not on file  Food Insecurity: No Food Insecurity  . Worried About Charity fundraiser in the Last Year: Never true  . Ran Out of Food in the Last Year: Never true  Transportation Needs: No Transportation Needs  . Lack of Transportation (Medical): No  . Lack of Transportation (Non-Medical): No  Physical Activity: Insufficiently Active  . Days of Exercise per Week: 2 days  . Minutes of Exercise per Session: 20 min  Stress: No Stress Concern Present  . Feeling of Stress : Only a little  Social Connections: Slightly Isolated  . Frequency of Communication with Friends and Family: Twice a week  . Frequency of Social Gatherings with Friends and Family: Once a week  . Attends Religious Services: More than 4 times per year  . Active Member of Clubs or Organizations: Yes  . Attends Archivist Meetings: 1 to 4 times per year  . Marital Status:  Divorced  Human resources officer Violence: Not At Risk  . Fear of Current or Ex-Partner: No  . Emotionally Abused: No  . Physically Abused: No  . Sexually Abused: No    Family History:  Family History  Problem Relation Age of Onset  . Stroke Mother   . Fabry's disease Mother   . Fabry's disease Brother   . Colon cancer Neg Hx     Medications:   Current Outpatient Medications  on File Prior to Visit  Medication Sig Dispense Refill  . Accu-Chek FastClix Lancets MISC 1 each by Other route 3 (three) times daily. 102 each 12  . acetaminophen (TYLENOL) 325 MG tablet Take 1-2 tablets (325-650 mg total) by mouth every 4 (four) hours as needed for mild pain.    . Agalsidase beta (FABRAZYME IV) Inject 115 mg into the vein every 14 (fourteen) days.     [name] Kitchen albuterol (PROAIR HFA) 108 (90 Base) MCG/ACT inhaler Inhale 1 puff into the lungs every 6 (six) hours as needed for wheezing or shortness of breath. 8.5 Inhaler 1  . allopurinol (ZYLOPRIM) 100 MG tablet Take 3 tablets (300 mg total) by mouth daily. 90 tablet 1  . amLODipine (NORVASC) 10 MG tablet Take 1 tablet (10 mg total) by mouth daily. 30 tablet 6  . atorvastatin (LIPITOR) 20 MG tablet Take 20 mg by mouth daily.    . clopidogrel (PLAVIX) 75 MG tablet Take 1 tablet (75 mg total) by mouth daily. 30 tablet 6  . fexofenadine (ALLEGRA) 180 MG tablet Take 1 tablet (180 mg total) by mouth daily. 30 tablet 3  . fluticasone (FLONASE) 50 MCG/ACT nasal spray Place 1 spray into both nostrils daily. (Patient taking differently: Place 1 spray into both nostrils daily as needed for allergies. ) 16 g 1  . furosemide (LASIX) 20 MG tablet Take 1 tablet (20 mg total) by mouth daily. 30 tablet 6  . glucose blood (ACCU-CHEK AVIVA PLUS) test strip 1 each by Other route 3 (three) times daily. Use as instructed 100 each 12  . Insulin Lispro Prot & Lispro (HUMALOG 75/25 MIX) (75-25) 100 UNIT/ML Kwikpen INJECT 20 UNITS INTO THE SKIN 2 TIMES DAILY (Patient taking  differently: Inject 20 Units into the skin 2 (two) times daily. Morning & before supper.) 15 mL 6  . Insulin Pen Needle (PX SHORTLENGTH PEN NEEDLES) 31G X 8 MM MISC 1 each by Does not apply route 2 (two) times daily before a meal. 100 each 8  . losartan-hydrochlorothiazide (HYZAAR) 50-12.5 MG tablet TAKE 1 TABLET BY MOUTH EVERY DAY 90 tablet 1  . OLIVE LEAF EXTRACT PO Take 1 capsule by mouth daily.     No current facility-administered medications on file prior to visit.    Allergies:   Allergies  Allergen Reactions  . Lisinopril Anaphylaxis  . Shellfish Allergy Hives and Swelling     Physical Exam  Today's Vitals   06/04/19 1055  BP: 122/74  Pulse: 75  Temp: (!) 97.2 F (36.2 C)  TempSrc: Oral  Weight: 291 lb 9.6 oz (132.3 kg)  Height: 5\' 10"  (1.778 m)   Body mass index is 41.84 kg/m.  General: Obese pleasant middle-aged African-American male, seated, in no evident distress Head: head normocephalic and atraumatic.   Neck: supple with no carotid or supraclavicular bruits Cardiovascular: regular rate and rhythm, no murmurs; bilateral lower extremity lymphedema Musculoskeletal: no deformity Skin:  no rash/petichiae Vascular:  Normal pulses all extremities   Neurologic Exam Mental Status: Awake and fully alert. Oriented to place and time. Recent and remote memory intact. Attention span, concentration and fund of knowledge appropriate. Mood and affect appropriate.  Cranial Nerves: Fundoscopic exam reveals sharp disc margins. Pupils equal, briskly reactive to light. Extraocular movements show an gaze nystagmus with left gaze. Visual fields superior homonymous quadrantanopia. Hearing intact. Facial sensation intact. Face, tongue, palate moves normally and symmetrically.  Motor: Normal bulk and tone. Normal strength in all tested extremity muscles. Sensory.: intact to  touch , pinprick , position and vibratory sensation.  Coordination: Rapid alternating movements normal in all  extremities. Finger-to-nose performed accurately bilaterally and difficulty performing heel-to-shin due to lymphedema. Gait and Station: Arises from chair without difficulty. Stance is normal. Gait demonstrates  broad-based gait with greater difficulty with turns and use of cane Reflexes: 1+ and symmetric. Toes downgoing.      Diagnostic Data (Labs, Imaging, Testing)  CT HEAD WO CONTRAST 10/19/2018 IMPRESSION: 1. Chronic intracranial artery dolichoectasia with progressed calcified atherosclerosis since 2012. A lacune or infarct of the left pons is new since 2012 but probably chronic. Still, consider Acute Small Vessel Ischemia in this clinical setting. 2. No intracranial hemorrhage or cortically based infarct identified.  VAS [place] TCD 10/20/2018 Summary: Low normal mean flow velocities in majority of identified vessels of anterior and posterior cerebral circulations. Globally elevated pulsatility indices suggest diffuse intracranial atherosclerosis likely  VAS US CAROTID DUPLEX BILATERAL 10/20/2018 Summary: Right Carotid: Velocities in the right ICA are consistent with a 1-39% stenosis. Left Carotid: Velocities in the left ICA are consistent with a 1-39% stenosis. Vertebrals:  Bilateral vertebral arteries demonstrate antegrade flow. Subclavians: Normal flow hemodynamics were seen in bilateral subclavian              arteries.  ECHOCARDIOGRAM 10/20/2018 IMPRESSIONS  1. The left ventricle has normal systolic function, with an ejection fraction of 55-60%. The cavity size was normal. There is moderately increased left ventricular wall thickness. Left ventricular diastolic Doppler parameters are consistent with  impaired relaxation. Elevated left ventricular end-diastolic pressure The E/e' is >15. There is abnormal septal motion consistent with left bundle branch block. No evidence of left ventricular regional wall motion abnormalities.  2. The mitral valve is abnormal. Mild thickening of  the mitral valve leaflet. There is moderate mitral annular calcification present.  3. The tricuspid valve is grossly normal.  4. The aortic valve is tricuspid. Mild sclerosis of the aortic valve. No stenosis of the aortic valve.    ASSESSMENT: [name] is a 58 y.o. year old male here with likely brainstem lacunar infarct secondary to small vessel disease too small to be seen on imaging on 10/19/2018. Vascular risk factors include HTN, HLD, DM, Fabry's disease, diastolic heart failure, and prior history of stroke on imaging.  Initially recovering well but approximately 1 month ago, started to experience worsening diplopia, worsening imbalance and vertigo.  He was evaluated by neurology with concern of new stroke    PLAN:  1. Worsening prior symptoms: Order previously placed for CT head which will be obtained tomorrow to rule out acute stroke.  Unable to obtain MRI due to pacemaker.  It was also recommended for patient to participate in therapy at neuro rehab but patient wishes to consider this further at home and will call hospice if interested in the future 2. Likely brainstem stroke: Continue clopidogrel 75 mg daily  and atorvastatin 80 mg for secondary stroke prevention. Maintain strict control of hypertension with blood pressure goal below 130/90, diabetes with hemoglobin A1c goal below 6.5% and cholesterol with LDL cholesterol (bad cholesterol) goal below 70 mg/dL.  I also advised the patient to eat a healthy diet with plenty of whole grains, cereals, fruits and vegetables, exercise regularly with at least 30 minutes of continuous activity daily and maintain ideal body weight. 3. HTN: Advised to continue current treatment regimen.  Recommended monitoring BP at home with continued follow-up with PCP for management 4. HLD: Advised to continue current treatment regimen along with  continued follow-up with PCP for future prescribing and monitoring of lipid panel 5. DMII: Advised to continue to  monitor glucose levels at home along with continued follow-up with PCP for management and monitoring 6. Fabry's disease: Continue to follow with Dr. Krista [name] for ongoing management and monitoring.  During visit, denies any symptoms of neuropathy and sensation intact, and lymphedema has been stable.    Follow-up in 3 months or call earlier if needed   Greater than 50% of time during this 25 minute visit was spent on counseling, explanation of diagnosis of likely brainstem stroke, discussion regarding recurrence of symptoms.  Reviewing risk factor management of HTN, HLD, DM and Fabry's disease, planning of further management along with potential future management, and discussion with patient answering all questions to patient satisfaction    Frann Rider, [facility]  [facility] [address] [place], [place] [address]  Phone [phone] Fax [phone] Note: This document was prepared with digital dictation and possible smart phrase technology. Any transcriptional errors that result from this process are unintentional.

## 2019-06-04 ENCOUNTER — Ambulatory Visit: Payer: Medicare Other | Admitting: Adult Health

## 2019-06-04 ENCOUNTER — Other Ambulatory Visit: Payer: Self-pay

## 2019-06-04 ENCOUNTER — Encounter: Payer: Self-pay | Admitting: Adult Health

## 2019-06-04 VITALS — BP 122/74 | HR 75 | Temp 97.2°F | Ht 70.0 in | Wt 291.6 lb

## 2019-06-04 DIAGNOSIS — I10 Essential (primary) hypertension: Secondary | ICD-10-CM

## 2019-06-04 DIAGNOSIS — E669 Obesity, unspecified: Secondary | ICD-10-CM

## 2019-06-04 DIAGNOSIS — Z8673 Personal history of transient ischemic attack (TIA), and cerebral infarction without residual deficits: Secondary | ICD-10-CM

## 2019-06-04 DIAGNOSIS — E1169 Type 2 diabetes mellitus with other specified complication: Secondary | ICD-10-CM

## 2019-06-04 DIAGNOSIS — E119 Type 2 diabetes mellitus without complications: Secondary | ICD-10-CM

## 2019-06-04 DIAGNOSIS — E785 Hyperlipidemia, unspecified: Secondary | ICD-10-CM

## 2019-06-04 DIAGNOSIS — E7521 Fabry (-Anderson) disease: Secondary | ICD-10-CM

## 2019-06-04 NOTE — Patient Instructions (Signed)
Continue clopidogrel 75 mg daily  and Lipitor for secondary stroke prevention  Continue to follow up with PCP regarding cholesterol, blood pressure and diabetes management   Consider participation in outpatient therapy for ongoing balance concerns  Continue to follow with Dr. Krista Blue for Fabry's disease monitoring and management  Continue to monitor blood pressure at home  Maintain strict control of hypertension with blood pressure goal below 130/90, diabetes with hemoglobin A1c goal below 6.5% and cholesterol with LDL cholesterol (bad cholesterol) goal below 70 mg/dL. I also advised the patient to eat a healthy diet with plenty of whole grains, cereals, fruits and vegetables, exercise regularly and maintain ideal body weight.  Followup in the future with me in 3 months or call earlier if needed       Thank you for coming to see [place] at [facility]. I hope we have been able to provide you high quality care today.  You may receive a patient satisfaction survey over the next few weeks. We would appreciate your feedback and comments so that we may continue to improve ourselves and the health of our patients.

## 2019-06-05 ENCOUNTER — Other Ambulatory Visit: Payer: Self-pay

## 2019-06-05 ENCOUNTER — Ambulatory Visit
Admission: RE | Admit: 2019-06-05 | Discharge: 2019-06-05 | Disposition: A | Discharge status: Home / Self Care | Payer: Medicare Other | Source: Ambulatory Visit | Attending: Adult Health | Admitting: Adult Health

## 2019-06-05 ENCOUNTER — Ambulatory Visit (INDEPENDENT_AMBULATORY_CARE_PROVIDER_SITE_OTHER): Payer: Medicare Other | Admitting: *Deleted

## 2019-06-05 ENCOUNTER — Encounter (HOSPITAL_COMMUNITY): Payer: Medicare Other

## 2019-06-05 ENCOUNTER — Other Ambulatory Visit: Payer: Self-pay | Admitting: Internal Medicine

## 2019-06-05 ENCOUNTER — Other Ambulatory Visit: Payer: Medicare Other

## 2019-06-05 DIAGNOSIS — I442 Atrioventricular block, complete: Secondary | ICD-10-CM | POA: Diagnosis not present

## 2019-06-05 DIAGNOSIS — R29818 Other symptoms and signs involving the nervous system: Secondary | ICD-10-CM | POA: Diagnosis not present

## 2019-06-05 NOTE — Patient Instructions (Signed)
Call office if you have increased swelling at incision site, drainage, or redness.

## 2019-06-06 ENCOUNTER — Encounter: Payer: Self-pay | Admitting: Adult Health

## 2019-06-06 ENCOUNTER — Telehealth: Payer: Self-pay | Admitting: *Deleted

## 2019-06-06 LAB — CUP PACEART INCLINIC DEVICE CHECK
Battery Remaining Longevity: 86 mo
Battery Voltage: 3.07 V
Brady Statistic RA Percent Paced: 50 %
Brady Statistic RV Percent Paced: 99.54 %
Date Time Interrogation Session: 20201229120500
Implantable Lead Implant Date: 20120907
Implantable Lead Implant Date: 20120907
Implantable Lead Location: 753859
Implantable Lead Location: 753860
Implantable Pulse Generator Implant Date: 20201215
Lead Channel Impedance Value: 300 Ohm
Lead Channel Impedance Value: 412.5 Ohm
Lead Channel Pacing Threshold Amplitude: 0.75 V
Lead Channel Pacing Threshold Amplitude: 1.75 V
Lead Channel Pacing Threshold Pulse Width: 0.5 ms
Lead Channel Pacing Threshold Pulse Width: 0.8 ms
Lead Channel Sensing Intrinsic Amplitude: 5 mV
Lead Channel Setting Pacing Amplitude: 1.75 V
Lead Channel Setting Pacing Amplitude: 2 V
Lead Channel Setting Pacing Pulse Width: 0.8 ms
Lead Channel Setting Sensing Sensitivity: 4 mV
Pulse Gen Model: 2272
Pulse Gen Serial Number: 9187005

## 2019-06-06 NOTE — Telephone Encounter (Signed)
Spoke with patient and informed him that his recent CT head scan imaging did not show evidence of new stroke or abnormality. He asked if it showed his sinuses because he always has drainage. I advised the note states sinuses are unremarkable. I advised he ask PCP to refer him to ENT. He stated he intends to do that this coming year, verbalized understanding, appreciation.

## 2019-06-06 NOTE — Progress Notes (Signed)
Pacemaker check in clinic. Normal device function. Thresholds, sensing, impedances consistent with previous measurements. Device programmed to maximize longevity. No mode switch or high ventricular rates noted. Device programmed at appropriate safety margins. Histogram distribution appropriate for patient activity level. Device programmed to optimize intrinsic conduction. Estimated longevity 7 yrs, 2 months. Patient enrolled in remote follow-up and next remote due 08/20/19. Follow up with Dr Rayann Heman 08/22/19.Patient education completed

## 2019-06-07 ENCOUNTER — Encounter: Payer: Self-pay | Admitting: Internal Medicine

## 2019-06-07 DIAGNOSIS — R0981 Nasal congestion: Secondary | ICD-10-CM

## 2019-06-07 DIAGNOSIS — H9192 Unspecified hearing loss, left ear: Secondary | ICD-10-CM

## 2019-06-12 NOTE — Progress Notes (Signed)
I agree with the above plan 

## 2019-06-16 ENCOUNTER — Other Ambulatory Visit: Payer: Self-pay | Admitting: Internal Medicine

## 2019-06-16 DIAGNOSIS — I10 Essential (primary) hypertension: Secondary | ICD-10-CM

## 2019-06-16 DIAGNOSIS — G463 Brain stem stroke syndrome: Secondary | ICD-10-CM

## 2019-06-18 NOTE — Telephone Encounter (Signed)
Patient has not been seen since 12/2018. Will defer to PCP to refill if appropriate.

## 2019-06-20 DIAGNOSIS — E7521 Fabry (-Anderson) disease: Secondary | ICD-10-CM | POA: Diagnosis not present

## 2019-06-21 DIAGNOSIS — E7521 Fabry (-Anderson) disease: Secondary | ICD-10-CM | POA: Diagnosis not present

## 2019-06-24 NOTE — Progress Notes (Signed)
PPM remote 

## 2019-06-27 DIAGNOSIS — E118 Type 2 diabetes mellitus with unspecified complications: Secondary | ICD-10-CM | POA: Diagnosis not present

## 2019-06-27 DIAGNOSIS — N1831 Chronic kidney disease, stage 3a: Secondary | ICD-10-CM | POA: Diagnosis not present

## 2019-06-27 DIAGNOSIS — E7521 Fabry (-Anderson) disease: Secondary | ICD-10-CM | POA: Diagnosis not present

## 2019-06-27 DIAGNOSIS — M109 Gout, unspecified: Secondary | ICD-10-CM | POA: Diagnosis not present

## 2019-06-27 DIAGNOSIS — I129 Hypertensive chronic kidney disease with stage 1 through stage 4 chronic kidney disease, or unspecified chronic kidney disease: Secondary | ICD-10-CM | POA: Diagnosis not present

## 2019-07-03 ENCOUNTER — Ambulatory Visit: Payer: Medicare Other | Admitting: Adult Health

## 2019-07-04 ENCOUNTER — Ambulatory Visit (INDEPENDENT_AMBULATORY_CARE_PROVIDER_SITE_OTHER): Payer: Medicare Other | Admitting: Otolaryngology

## 2019-07-04 ENCOUNTER — Other Ambulatory Visit: Payer: Self-pay

## 2019-07-04 ENCOUNTER — Encounter (INDEPENDENT_AMBULATORY_CARE_PROVIDER_SITE_OTHER): Payer: Self-pay | Admitting: Otolaryngology

## 2019-07-04 VITALS — Temp 97.9°F

## 2019-07-04 DIAGNOSIS — H9041 Sensorineural hearing loss, unilateral, right ear, with unrestricted hearing on the contralateral side: Secondary | ICD-10-CM

## 2019-07-04 DIAGNOSIS — J310 Chronic rhinitis: Secondary | ICD-10-CM | POA: Diagnosis not present

## 2019-07-04 DIAGNOSIS — H9311 Tinnitus, right ear: Secondary | ICD-10-CM | POA: Diagnosis not present

## 2019-07-04 DIAGNOSIS — H903 Sensorineural hearing loss, bilateral: Secondary | ICD-10-CM | POA: Diagnosis not present

## 2019-07-04 NOTE — Progress Notes (Signed)
HPI: [name] is a 59 y.o. male who presents is referred by his PCP for evaluation of right ear complaints and left-sided nasal congestion.  He feels like the right ear is been clogged or congested now for about 6 months ever since he had a stroke back in May of last year.  He also complains of chronic intermittent left-sided nasal congestion.  Sometimes is worse than other times.  He feels like something is blocking his nose.  Denies any pain or pressure.  He gets mucus discharge which is generally clear. I reviewed a CT scan of his head performed last year in December that showed clear paranasal sinuses with septum slightly deviated to the left.  Both middle ear spaces were clear on the CT scan also.. Patient does use Flonase and Allegra.  Past Medical History:  Diagnosis Date  . Bell's palsy   . Bifascicular block   . Chronic bronchitis ([place])    "seasonal; get it q yr"  . Chronic diastolic CHF (congestive heart failure) ([place]) 2010  . Chronic kidney disease (CKD), stage II (mild)    stage II to III/notes 07/23/2014  . Diastolic heart failure secondary to hypertrophic cardiomyopathy ([place]) CARDIOLOGIST-- DR Daneen Schick  . Dyspnea    increased exertion   . Edema 07/2013  . 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 Gout, arthritis 2014   bil feet. right worse  . History of cellulitis of skin with lymphangitis LEFT LEG  . HOCM (hypertrophic obstructive cardiomyopathy) ([place])    a. Echo 11/16: Severe LVH, EF 55-60%, abnormal GLS consistent with HOCM, no SAM, mild LAE  . Hypertension 20 years  . Lymphedema of lower extremity LEFT  >  RIGHT   "using ankle high socks at home; pump doesn't work for me" (07/23/2014)  . Pacemaker    02/12/11  . Pneumonia 08/2011  . Seasonal asthma NO INHALERS   30 years  . Second degree Mobitz II AV block    with syncope, s/p PPM  . Short of breath on exertion   . Type II diabetes mellitus ([place]) 2012   INSULIN  DEPENDENT   Past Surgical History:  Procedure Laterality Date  . CARDIAC CATHETERIZATION  03-12-2011  DR Daneen Schick   HYPERTROPHIC CARDIOMYOPATHY WITH LV CAVITY  APPEARANCE CONSISTANT WITH SIGNIFICANT APICAL HYPERTROPHY/ NORMAL LVSF / EF 55%/ EVIDENCE OF DIASTOLIC DYSFUNCTION WITH EDP OF 23-42mmHg AFTER A-WAVE/ NORMAL CORONARY ARTERIES  . EVALUATION UNDER ANESTHESIA WITH FISTULECTOMY N/A 06/23/2015   Procedure: EXAM UNDER ANESTHESIA WITH POSSIBLE FISTULOTOMY;  Surgeon: Judeth Horn, MD;  Location: [facility];  Service: General;  Laterality: N/A;  . HERNIA REPAIR  [id number]   Umbilical Hernia Repair  . INCISION AND DRAINAGE PERIRECTAL ABSCESS Left 07/29/2014   Procedure: IRRIGATION AND DEBRIDEMENT PERIRECTAL ABSCESS;  Surgeon: Doreen Salvage, MD;  Location: [facility];  Service: General;  Laterality: Left;  Prone position  . INSERT / REPLACE / REMOVE PACEMAKER  02/12/2011   SJM implanted by Dr Rayann Heman for Mobitz II second degree AV block and syncope  . IRRIGATION AND DEBRIDEMENT ABSCESS N/A 07/27/2013   Procedure: IRRIGATION AND DEBRIDEMENT OF SKIN, SOFT TISSUE AND MUSCLES OF UPPER BACK (11X19X4cm) WITH 10 BLADE AND PULSATILE LAVAGE ;  Surgeon: Gayland Curry, MD;  Location: [facility];  Service: General;  Laterality: N/A;  . PPM GENERATOR CHANGEOUT N/A 05/22/2019   Procedure: PPM GENERATOR CHANGEOUT;  Surgeon: Thompson Grayer, MD;  Location: [facility];  Service: Cardiovascular;  Laterality: N/A;  . UMBILICAL HERNIA REPAIR  03/22/2012   Procedure: HERNIA REPAIR UMBILICAL ADULT;  Surgeon: Leighton Ruff, MD;  [facility];  Service: General;  Laterality: N/A;  Umbilical Hernia Repair    Social History   Socioeconomic History  . Marital status: Divorced    Spouse name: Not on file  . Number of children: Not on file  . Years of education: Not on file  . Highest education level: Not on file  Occupational History  . Not on file  Tobacco Use  . Smoking status: Never Smoker  . Smokeless tobacco: Never Used   Substance and Sexual Activity  . Alcohol use: Yes    Comment: 07/23/2014 "might have a few drinks on holidays or at cookouts"  . Drug use: No  . Sexual activity: Yes  Other Topics Concern  . 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 Difficulty of Paying Living Expenses: Not on file  Food Insecurity: No Food Insecurity  . Worried About Charity fundraiser in the Last Year: Never true  . Ran Out of Food in the Last Year: Never true  Transportation Needs: No Transportation Needs  . Lack of Transportation (Medical): No  . Lack of Transportation (Non-Medical): No  Physical Activity: Insufficiently Active  . Days of Exercise per Week: 2 days  . Minutes of Exercise per Session: 20 min  Stress: No Stress Concern Present  . Feeling of Stress : Only a little  Social Connections: Slightly Isolated  . Frequency of Communication with Friends and Family: Twice a week  . Frequency of Social Gatherings with Friends and Family: Once a week  . Attends Religious Services: More than 4 times per year  . Active Member of Clubs or Organizations: Yes  . Attends Archivist Meetings: 1 to 4 times per year  . Marital Status: Divorced   Family History  Problem Relation Age of Onset  . Stroke Mother   . Fabry's disease Mother   . Fabry's disease Brother   . Colon cancer Neg Hx    Allergies  Allergen Reactions  . Lisinopril Anaphylaxis  . Shellfish Allergy Hives and Swelling   Prior to Admission medications   Medication Sig Start Date End Date Taking? Authorizing Provider  Accu-Chek FastClix Lancets MISC 1 each by Other route 3 (three) times daily. 01/18/19  Yes Ladell Pier, MD  acetaminophen (TYLENOL) 325 MG tablet Take 1-2 tablets (325-650 mg total) by mouth every 4 (four) hours as needed for mild pain. 05/23/19  Yes Shirley Friar, PA-C  Agalsidase beta (FABRAZYME IV) Inject 115 mg  into the vein every 14 (fourteen) days.    Yes [provider]  albuterol (PROAIR HFA) 108 (90 Base) MCG/ACT inhaler Inhale 1 puff into the lungs every 6 (six) hours as needed for wheezing or shortness of breath. 05/29/18  Yes Elsie Stain, MD  allopurinol (ZYLOPRIM) 100 MG tablet Take 3 tablets (300 mg total) by mouth daily. Please make PCP appointment for more refills. 06/05/19  Yes Ladell Pier, MD  amLODipine (NORVASC) 10 MG tablet Take 1 tablet (10 mg total) by mouth daily. 12/11/18  Yes Ladell Pier, MD  atorvastatin (LIPITOR) 20 MG tablet Take 20 mg by mouth daily.   Yes [provider]  atorvastatin (LIPITOR) 80 MG tablet TAKE 1 TABLET (80 MG TOTAL) BY  MOUTH DAILY AT 6 PM. 06/18/19  Yes Ladell Pier, MD  clopidogrel (PLAVIX) 75 MG tablet TAKE 1 TABLET BY MOUTH EVERY DAY 06/18/19  Yes Ladell Pier, MD  fexofenadine (ALLEGRA) 180 MG tablet Take 1 tablet (180 mg total) by mouth daily. 08/01/18  Yes Ladell Pier, MD  fluticasone (FLONASE) 50 MCG/ACT nasal spray Place 1 spray into both nostrils daily. Patient taking differently: Place 1 spray into both nostrils daily as needed for allergies.  05/12/17  Yes Ladell Pier, MD  furosemide (LASIX) 20 MG tablet TAKE 1 TABLET BY MOUTH EVERY DAY 06/18/19  Yes Ladell Pier, MD  glucose blood (ACCU-CHEK AVIVA PLUS) test strip 1 each by Other route 3 (three) times daily. Use as instructed 01/18/19  Yes Ladell Pier, MD  Insulin Lispro Prot & Lispro (HUMALOG 75/25 MIX) (75-25) 100 UNIT/ML Kwikpen INJECT 20 UNITS INTO THE SKIN 2 TIMES DAILY Patient taking differently: Inject 20 Units into the skin 2 (two) times daily. Morning & before supper. 12/11/18  Yes Ladell Pier, MD  Insulin Pen Needle ([place] PEN NEEDLES) 31G X 8 MM MISC 1 each by Does not apply route 2 (two) times daily before a meal. 05/29/18  Yes Elsie Stain, MD  losartan-hydrochlorothiazide (HYZAAR) 50-12.5 MG tablet  TAKE 1 TABLET BY MOUTH EVERY DAY 02/11/19  Yes Ladell Pier, MD  OLIVE LEAF EXTRACT PO Take 1 capsule by mouth daily.   Yes [provider]     Positive ROS: Otherwise negative  All other systems have been reviewed and were otherwise negative with the exception of those mentioned in the HPI and as above.  Physical Exam: Constitutional: Alert, well-appearing, no acute distress Ears: External ears without lesions or tenderness. Ear canals he had minimal wax buildup in both ear canals that was cleaned with suction.  TMs appear clear bilaterally.  On tuning fork testing he has diminished hearing in the right ear compared to the left. Nasal: External nose without lesions. Septum slightly deviated to the left.. Moderate rhinitis.  No polyps or intranasal masses noted.  Both middle meatus regions were clear. Oral: Lips and gums without lesions. Tongue and palate mucosa without lesions. Posterior oropharynx clear. Neck: No palpable adenopathy or masses Respiratory: Breathing comfortably  Skin: No facial/neck lesions or rash noted.  Audiogram in the office today demonstrated a marked right ear SNHL of approximately 60 dB.  The left ear has normal hearing in the lower frequencies with a downsloping SNHL in the upper frequencies above 2000.  Reviewed with the patient that this is all sensorineural hearing loss and may be related to the stroke although he is noted some hearing loss in the right ear for several years.  SRT's were 65dB in the right and 20 dB in the left.  Procedures  Assessment: Right ear moderate severe SNHL.  Left ear presbycusis with downsloping SNHL in the upper frequencies Chronic rhinitis with slight septal deviation to the left  Plan: Discussed with patient concerning treatment options for the right ear hearing loss.  There is no medicine or surgery that will improve the hearing in the right ear.  His only option would be use of a hearing aid.  He will follow-up with  our audiologist concerning possible hearing aid use.   Radene Journey, MD   CC:

## 2019-07-05 ENCOUNTER — Encounter (INDEPENDENT_AMBULATORY_CARE_PROVIDER_SITE_OTHER): Payer: Self-pay

## 2019-07-05 DIAGNOSIS — E7521 Fabry (-Anderson) disease: Secondary | ICD-10-CM | POA: Diagnosis not present

## 2019-07-07 ENCOUNTER — Encounter: Payer: Self-pay | Admitting: Adult Health

## 2019-07-07 DIAGNOSIS — G463 Brain stem stroke syndrome: Secondary | ICD-10-CM

## 2019-07-09 ENCOUNTER — Encounter: Payer: Self-pay | Admitting: *Deleted

## 2019-07-09 ENCOUNTER — Other Ambulatory Visit: Payer: Self-pay | Admitting: Internal Medicine

## 2019-07-09 NOTE — Telephone Encounter (Signed)
error 

## 2019-07-10 DIAGNOSIS — E119 Type 2 diabetes mellitus without complications: Secondary | ICD-10-CM | POA: Diagnosis not present

## 2019-07-10 DIAGNOSIS — H1045 Other chronic allergic conjunctivitis: Secondary | ICD-10-CM | POA: Diagnosis not present

## 2019-07-10 DIAGNOSIS — H2513 Age-related nuclear cataract, bilateral: Secondary | ICD-10-CM | POA: Diagnosis not present

## 2019-07-10 DIAGNOSIS — E7521 Fabry (-Anderson) disease: Secondary | ICD-10-CM | POA: Diagnosis not present

## 2019-07-10 DIAGNOSIS — Z794 Long term (current) use of insulin: Secondary | ICD-10-CM | POA: Diagnosis not present

## 2019-07-19 DIAGNOSIS — E7521 Fabry (-Anderson) disease: Secondary | ICD-10-CM | POA: Diagnosis not present

## 2019-07-23 ENCOUNTER — Other Ambulatory Visit: Payer: Self-pay

## 2019-07-23 ENCOUNTER — Ambulatory Visit: Payer: Medicare Other | Attending: Adult Health | Admitting: Physical Therapy

## 2019-07-23 DIAGNOSIS — R2689 Other abnormalities of gait and mobility: Secondary | ICD-10-CM | POA: Diagnosis not present

## 2019-07-23 DIAGNOSIS — M6281 Muscle weakness (generalized): Secondary | ICD-10-CM | POA: Diagnosis not present

## 2019-07-23 DIAGNOSIS — R2681 Unsteadiness on feet: Secondary | ICD-10-CM | POA: Insufficient documentation

## 2019-07-23 NOTE — Therapy (Signed)
[facility] [address] [place], [place], [address] Phone: [phone]   Fax:  [phone]  Physical Therapy Evaluation  Patient Details  Name: [name] MRN: [id number] Date of Birth: 1961/04/24 Referring Provider (PT): Frann Rider, NP   Encounter Date: 07/23/2019  PT End of Session - 07/23/19 1110    Visit Number  1    Number of Visits  18    Date for PT Re-Evaluation  10/21/19    Authorization Type  [employer] Medicare-will need 10th visit progress note    PT Start Time  0833    PT Stop Time  0920    PT Time Calculation (min)  47 min    Activity Tolerance  Patient tolerated treatment well    Behavior During Therapy  [facility] for tasks assessed/performed       Past Medical History:  Diagnosis Date  . Bell's palsy   . Bifascicular block   . Chronic bronchitis ([place])    "seasonal; get it q yr"  . Chronic diastolic CHF (congestive heart failure) ([place]) 2010  . Chronic kidney disease (CKD), stage II (mild)    stage II to III/notes 07/23/2014  . Diastolic heart failure secondary to hypertrophic cardiomyopathy ([place]) CARDIOLOGIST-- DR Daneen Schick  . Dyspnea    increased exertion   . Edema 07/2013  . 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 Gout, arthritis 2014   bil feet. right worse  . History of cellulitis of skin with lymphangitis LEFT LEG  . HOCM (hypertrophic obstructive cardiomyopathy) ([place])    a. Echo 11/16: Severe LVH, EF 55-60%, abnormal GLS consistent with HOCM, no SAM, mild LAE  . Hypertension 20 years  . Lymphedema of lower extremity LEFT  >  RIGHT   "using ankle high socks at home; pump doesn't work for me" (07/23/2014)  . Pacemaker    02/12/11  . Pneumonia 08/2011  . Seasonal asthma NO INHALERS   30 years  . Second degree Mobitz II AV block    with syncope, s/p PPM  . Short of breath on exertion   . Type II diabetes mellitus ([place]) 2012   INSULIN DEPENDENT     Past Surgical History:  Procedure Laterality Date  . CARDIAC CATHETERIZATION  03-12-2011  DR Daneen Schick   HYPERTROPHIC CARDIOMYOPATHY WITH LV CAVITY  APPEARANCE CONSISTANT WITH SIGNIFICANT APICAL HYPERTROPHY/ NORMAL LVSF / EF 55%/ EVIDENCE OF DIASTOLIC DYSFUNCTION WITH EDP OF 23-22mmHg AFTER A-WAVE/ NORMAL CORONARY ARTERIES  . EVALUATION UNDER ANESTHESIA WITH FISTULECTOMY N/A 06/23/2015   Procedure: EXAM UNDER ANESTHESIA WITH POSSIBLE FISTULOTOMY;  Surgeon: Judeth Horn, MD;  Location: [facility];  Service: General;  Laterality: N/A;  . HERNIA REPAIR  [id number]   Umbilical Hernia Repair  . INCISION AND DRAINAGE PERIRECTAL ABSCESS Left 07/29/2014   Procedure: IRRIGATION AND DEBRIDEMENT PERIRECTAL ABSCESS;  Surgeon: Doreen Salvage, MD;  Location: [facility];  Service: General;  Laterality: Left;  Prone position  . INSERT / REPLACE / REMOVE PACEMAKER  02/12/2011   SJM implanted by Dr Rayann Heman for Mobitz II second degree AV block and syncope  . IRRIGATION AND DEBRIDEMENT ABSCESS N/A 07/27/2013   Procedure: IRRIGATION AND DEBRIDEMENT OF SKIN, SOFT TISSUE AND MUSCLES OF UPPER BACK (11X19X4cm) WITH 10 BLADE AND PULSATILE LAVAGE ;  Surgeon: Gayland Curry, MD;  Location: [facility];  Service: General;  Laterality: N/A;  . PPM GENERATOR CHANGEOUT N/A 05/22/2019   Procedure: PPM GENERATOR CHANGEOUT;  Surgeon: Rayann Heman,  Jeneen Rinks, MD;  Location: [facility];  Service: Cardiovascular;  Laterality: N/A;  . UMBILICAL HERNIA REPAIR  03/22/2012   Procedure: HERNIA REPAIR UMBILICAL ADULT;  Surgeon: Leighton Ruff, MD;  [facility];  Service: General;  Laterality: N/A;  Umbilical Hernia Repair     There were no vitals filed for this visit.   Subjective Assessment - 07/23/19 0936    Subjective  Had a CVA last May (May 2020); went to hospital, rehab, and then went home.  Went from walker to a cane.  Had another episode like the initial CVA (Halloween weekend).  Have a sway, like I'm on a ship; happens when I walk.  Use the cane with  walking; have had one fall in the past 6 months.  With trying to get up from sitting, I went too far forward and fell forward into coffee table.    Patient Stated Goals  To work on this off-balance-ness.    Currently in Pain?  No/denies         [facility] PT Assessment - 07/23/19 0941      Assessment   Medical Diagnosis  Brainstem CVA    Referring Provider (PT)  Frann Rider, NP    Onset Date/Surgical Date  --   May 2020   Hand Dominance  Right      Precautions   Precautions  Fall      Balance Screen   Has the patient fallen in the past 6 months  Yes    How many times?  1    Has the patient had a decrease in activity level because of a fear of falling?   Yes    Is the patient reluctant to leave their home because of a fear of falling?   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, afterschool tutoring     ROM / Strength   AROM / PROM / Strength  Strength;AROM      Strength   Overall Strength  Within functional limits for tasks performed      Transfers   Transfers  Sit to Stand;Stand to Sit    Sit to Stand  5: Supervision;Without upper extremity assist;From chair/3-in-1    Five time sit to stand comments   20.56    Stand to Sit  5: Supervision;Without upper extremity assist;To chair/3-in-1      Ambulation/Gait   Ambulation/Gait  Yes    Ambulation/Gait Assistance  6: Modified independent (Device/Increase time)    Ambulation Distance (Feet)  130 Feet    Assistive device  Straight cane    Gait Pattern  Step-through pattern;Lateral hip instability    Sway/veering with gait at times   Ambulation Surface  Level;Indoor    Gait velocity  18.44 sec= 1.78 ft/sec      Standardized Balance Assessment   Standardized Balance Assessment  Timed Up and Go Test;Dynamic Gait Index      Dynamic  Gait Index   Level Surface  Mild Impairment    Change in Gait Speed  Moderate Impairment    Gait with Horizontal Head Turns  Moderate Impairment    Gait with Vertical Head Turns  Moderate Impairment    Gait and Pivot Turn  Mild Impairment   3.47   Step Over Obstacle  Moderate Impairment    Step Around Obstacles  Mild Impairment    Steps  Mild Impairment    Total Score  12    DGI comment:  Scores <19/24 indicate increased fall risk.      Timed Up and Go Test   TUG  Normal TUG    Normal TUG (seconds)  23.97    TUG Comments  Scores >13.5 sec indicate increased fall risk.      High Level Balance   High Level Balance Comments  Tandem L foot posterior 3 sec; R foot posterior unable; SLS unable.  EO/EC solid surface x 30 seconds incr. sway; on foam:  EO x 23 sec, EC unable.                Objective measurements completed on examination: See above findings.              PT Education - 07/23/19 1110    Education Details  Eval results, POC    Person(s) Educated  Patient    Methods  Explanation    Comprehension  Verbalized understanding       PT Short Term Goals - 07/23/19 1222      PT SHORT TERM GOAL #1   Title  Pt will be independent with HEP for improved balance, strength, gait for decreased fall risk.  TARGET for all STGS: 08/24/2019    Time  5    Period  Weeks    Status  New      PT SHORT TERM GOAL #2   Title  Pt will improve 5x sit<>stand to less than or equal to 16 seconds for improved functional lower extremity strength.    Time  5    Period  Weeks    Status  New      PT SHORT TERM GOAL #3   Title  Pt will improve TUG score to less than or equal to 18 seconds for decreased fall risk.    Time  5    Period  Weeks     Status  New      PT SHORT TERM GOAL #4   Title  Pt will improve DGI score to at least 16/24 for decreased fall risk.    Time  5    Period  Weeks    Status  New      PT SHORT TERM GOAL #5   Title  Pt will verbalize understanding of fall prevention in home environment.    Time  5    Period  Weeks    Status  New        PT Long Term Goals - 07/23/19 1227      PT LONG TERM GOAL #1   Title  Pt will be independent with progression of HEP to address strength, balance, and gait for decreased fall risk.  TARGET 09/21/2019    Time  9    Period  Weeks    Status  New      PT LONG TERM GOAL #2   Title  Pt will improve 5x sit<>stand to less than or equal to 12.5 seconds for improved functional  strength.    Time  9    Period  Weeks    Status  New      PT LONG TERM GOAL #3   Title  Pt will improve TUG score to less than or equal to 15 seconds for decreased fall risk.    Time  9    Period  Weeks    Status  New      PT LONG TERM GOAL #4   Title  Pt will improve DGI to at least 19/24 for decreased fall risk.    Time  9    Period  Weeks    Status  New      PT LONG TERM GOAL #5   Title  Pt will improve gait velocity to at least 2.3 ft/sec for improved gait efficiency and safety in community.    Time  9    Period  Weeks    Status  New             Plan - 07/23/19 1112    Clinical Impression Statement  Pt is a 59 year old male who presents to OPPT status post CVA in May 2020, with c/o balance changes and unsteadiness.  He presents with decreased functional strength, decreased hip stability with gait and static standing balance, decreased functional lower extremity strength, decreased balance, decreased independence with gait.  He is at fall risk per gait velocity, DGI, and TUG scores; he has had one fall in the past 6 months.  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  He would benefit from skilled PT to address the above stated deficits to  decrease fall risk and improve functional mobility and independence.    Personal Factors and Comorbidities  Comorbidity 3+    Comorbidities  DM, pacemaker, lymphedema in BLEs, FAbry's disease, diastolic heart failure, chronic kidney disease, heart failure, Bell's palsy.    Examination-Activity Limitations  Locomotion Level;Transfers    Examination-Participation Restrictions  Community Activity;Shop;Volunteer    Stability/Clinical Decision Making  Evolving/Moderate complexity    Clinical Decision Making  Moderate    Rehab Potential  Good    PT Frequency  Other (comment)   1x/wk for 1 week, then 2x/wk for 8 weeks   PT Duration  Other (comment)   total POC = 9 weeks   PT Treatment/Interventions  ADLs/Self Care Home Management;Gait training;Stair training;Functional mobility training;Therapeutic activities;Therapeutic exercise;Balance training;Neuromuscular re-education;Patient/family education    PT Next Visit Plan  Initiate HEP:  hip abductor and glut strengthening, balance exercises to incorporate vestibular system; gait training with cane (may need to lower height of cane)    Consulted and Agree with Plan of Care  Patient       Patient will benefit from skilled therapeutic intervention in order to improve the following deficits and impairments:  Abnormal gait, Difficulty walking, Decreased balance, Decreased mobility, Decreased strength  Visit Diagnosis: Other abnormalities of gait and mobility  Unsteadiness on feet  Muscle weakness (generalized)     Problem List Patient Active Problem List   Diagnosis Date Noted  . 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 CVA (cerebral vascular accident) ([place]) 10/20/2018  . UTI (urinary tract infection) 10/19/2018  . Complete heart block ([place]) 08/01/2018  . Upper airway cough syndrome 08/01/2018  . Lymphedema 10/06/2016  . Gout, arthritis   . 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 Onychomycosis of toenail 09/26/2014  . Diabetes mellitus type 2 in  obese ([place])   . Iron  deficiency anemia 07/29/2014  . CKD (chronic kidney disease), stage III ([place])   . Chronic diastolic HF (heart failure) ([place]) 03/27/2013  . Peripheral edema 07/20/2012  . Obstructive sleep apnea, suspected 06/21/2012  . Pacemaker-St.Jude 02/23/2012  . Hypertension 08/09/2011  . COPD with asthma ([place]) 08/09/2011  . Mobitz type II atrioventricular block 05/19/2011  . 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[place]) 05/19/2011    Mazikeen Hehn W. 07/23/2019, 2:55 PM Frazier Butt., PT  [facility] [address] [place], [place], [address] Phone: [phone]   Fax:  [phone]  Name: [name] MRN: [id number] Date of Birth: 03/13/1961

## 2019-07-26 ENCOUNTER — Encounter: Payer: Self-pay | Admitting: Internal Medicine

## 2019-07-27 ENCOUNTER — Ambulatory Visit: Payer: Medicare Other | Attending: Internal Medicine | Admitting: Internal Medicine

## 2019-07-27 ENCOUNTER — Ambulatory Visit: Payer: Medicare Other | Admitting: Physical Therapy

## 2019-07-27 ENCOUNTER — Other Ambulatory Visit: Payer: Self-pay

## 2019-07-27 DIAGNOSIS — E1159 Type 2 diabetes mellitus with other circulatory complications: Secondary | ICD-10-CM

## 2019-07-27 DIAGNOSIS — R2689 Other abnormalities of gait and mobility: Secondary | ICD-10-CM | POA: Diagnosis not present

## 2019-07-27 DIAGNOSIS — Z794 Long term (current) use of insulin: Secondary | ICD-10-CM

## 2019-07-27 DIAGNOSIS — R2681 Unsteadiness on feet: Secondary | ICD-10-CM | POA: Diagnosis not present

## 2019-07-27 DIAGNOSIS — M6281 Muscle weakness (generalized): Secondary | ICD-10-CM | POA: Diagnosis not present

## 2019-07-27 NOTE — Therapy (Signed)
[facility] [address] [place], [place], [address] Phone: [phone]   Fax:  [phone]  Physical Therapy Treatment  Patient Details  Name: [name] MRN: [id number] Date of Birth: Jun 05, 1961 Referring Provider (PT): Frann Rider, NP   Encounter Date: 07/27/2019  PT End of Session - 07/27/19 1226    Visit Number  2    Number of Visits  18    Date for PT Re-Evaluation  10/21/19    Authorization Type  [employer] Medicare-will need 10th visit progress note    PT Start Time  0930    PT Stop Time  1012    PT Time Calculation (min)  42 min    Activity Tolerance  Patient tolerated treatment well    Behavior During Therapy  [facility] for tasks assessed/performed       Past Medical History:  Diagnosis Date  . Bell's palsy   . Bifascicular block   . Chronic bronchitis ([place])    "seasonal; get it q yr"  . Chronic diastolic CHF (congestive heart failure) ([place]) 2010  . Chronic kidney disease (CKD), stage II (mild)    stage II to III/notes 07/23/2014  . Diastolic heart failure secondary to hypertrophic cardiomyopathy ([place]) CARDIOLOGIST-- DR Daneen Schick  . Dyspnea    increased exertion   . Edema 07/2013  . 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 Gout, arthritis 2014   bil feet. right worse  . History of cellulitis of skin with lymphangitis LEFT LEG  . HOCM (hypertrophic obstructive cardiomyopathy) ([place])    a. Echo 11/16: Severe LVH, EF 55-60%, abnormal GLS consistent with HOCM, no SAM, mild LAE  . Hypertension 20 years  . Lymphedema of lower extremity LEFT  >  RIGHT   "using ankle high socks at home; pump doesn't work for me" (07/23/2014)  . Pacemaker    02/12/11  . Pneumonia 08/2011  . Seasonal asthma NO INHALERS   30 years  . Second degree Mobitz II AV block    with syncope, s/p PPM  . Short of breath on exertion   . Type II diabetes mellitus ([place]) 2012   INSULIN DEPENDENT     Past Surgical History:  Procedure Laterality Date  . CARDIAC CATHETERIZATION  03-12-2011  DR Daneen Schick   HYPERTROPHIC CARDIOMYOPATHY WITH LV CAVITY  APPEARANCE CONSISTANT WITH SIGNIFICANT APICAL HYPERTROPHY/ NORMAL LVSF / EF 55%/ EVIDENCE OF DIASTOLIC DYSFUNCTION WITH EDP OF 23-81mmHg AFTER A-WAVE/ NORMAL CORONARY ARTERIES  . EVALUATION UNDER ANESTHESIA WITH FISTULECTOMY N/A 06/23/2015   Procedure: EXAM UNDER ANESTHESIA WITH POSSIBLE FISTULOTOMY;  Surgeon: Judeth Horn, MD;  Location: [place];  Service: General;  Laterality: N/A;  . HERNIA REPAIR  [id number]   Umbilical Hernia Repair  . INCISION AND DRAINAGE PERIRECTAL ABSCESS Left 07/29/2014   Procedure: IRRIGATION AND DEBRIDEMENT PERIRECTAL ABSCESS;  Surgeon: Doreen Salvage, MD;  Location: [facility];  Service: General;  Laterality: Left;  Prone position  . INSERT / REPLACE / REMOVE PACEMAKER  02/12/2011   SJM implanted by Dr Rayann Heman for Mobitz II second degree AV block and syncope  . IRRIGATION AND DEBRIDEMENT ABSCESS N/A 07/27/2013   Procedure: IRRIGATION AND DEBRIDEMENT OF SKIN, SOFT TISSUE AND MUSCLES OF UPPER BACK (11X19X4cm) WITH 10 BLADE AND PULSATILE LAVAGE ;  Surgeon: Gayland Curry, MD;  Location: [facility];  Service: General;  Laterality: N/A;  . PPM GENERATOR CHANGEOUT N/A 05/22/2019   Procedure: PPM GENERATOR CHANGEOUT;  Surgeon: Rayann Heman,  Jeneen Rinks, MD;  Location: [facility];  Service: Cardiovascular;  Laterality: N/A;  . UMBILICAL HERNIA REPAIR  03/22/2012   Procedure: HERNIA REPAIR UMBILICAL ADULT;  Surgeon: Leighton Ruff, MD;  Location: [facility];  Service: General;  Laterality: N/A;  Umbilical Hernia Repair     There were no vitals filed for this visit.  Subjective Assessment - 07/27/19 0933    Subjective  No changes since last visit.  Don't usually get short of breath with exercise at home.    Patient Stated Goals  To work on this off-balance-ness.    Currently in Pain?  No/denies                       [facility] Adult PT  Treatment/Exercise - 07/27/19 0934      Transfers   Transfers  Sit to Stand;Stand to Sit    Sit to Stand  5: Supervision;Without upper extremity assist;From chair/3-in-1    Stand to Sit  5: Supervision;Without upper extremity assist;To chair/3-in-1    Number of Reps  Other reps (comment)   at least 5 reps throughout session     Exercises   Exercises  Knee/Hip      Knee/Hip Exercises: Standing   Hip Abduction  Stengthening;Right;Left;1 set;10 reps;Knee straight   cues posture   Hip Extension  Stengthening;Right;Left;1 set;10 reps;Knee straight   cues for posture   Other Standing Knee Exercises  Sidestepping R and L, 3 reps along counter      Knee/Hip Exercises: Seated   Abduction/Adduction   Strengthening;Right;Left;2 sets;10 reps   used red theraband 2nd set     Reiterated importance of hip strength/stability for balance      Balance Exercises - 07/27/19 0945      Balance Exercises: Standing   Standing Eyes Opened  Wide (BOA);Head turns;Solid surface;5 reps;Narrow base of support (BOS);Foam/compliant surface   Head nods   Standing Eyes Closed  Wide (BOA);Narrow base of support (BOS);Solid surface;Foam/compliant surface;1 rep;10 secs    Tandem Stance  Eyes open;Upper extremity support 2;3 reps;10 secs    SLS  Eyes open;Solid surface;Upper extremity support 2;3 reps;10 secs    Wall Bumps  Hip;Eyes opened;10 reps    Heel Raises  Both;10 reps    Toe Raise  10 reps     Answered pt's balance-related questions in regards to his speed on objective measures at eval   PT Education - 07/27/19 1226    Education Details  Initiated HEP-see instructions    Person(s) Educated  Patient    Methods  Explanation;Demonstration;Handout    Comprehension  Verbalized understanding;Returned demonstration       PT Short Term Goals - 07/23/19 1222      PT SHORT TERM GOAL #1   Title  Pt will be independent with HEP for improved balance, strength, gait for decreased fall risk.  TARGET for all  STGS: 08/24/2019    Time  5    Period  Weeks    Status  New      PT SHORT TERM GOAL #2   Title  Pt will improve 5x sit<>stand to less than or equal to 16 seconds for improved functional lower extremity strength.    Time  5    Period  Weeks    Status  New      PT SHORT TERM GOAL #3   Title  Pt will improve TUG score to less than or equal to 18 seconds for decreased fall risk.  Time  5    Period  Weeks    Status  New      PT SHORT TERM GOAL #4   Title  Pt will improve DGI score to at least 16/24 for decreased fall risk.    Time  5    Period  Weeks    Status  New      PT SHORT TERM GOAL #5   Title  Pt will verbalize understanding of fall prevention in home environment.    Time  5    Period  Weeks    Status  New        PT Long Term Goals - 07/23/19 1227      PT LONG TERM GOAL #1   Title  Pt will be independent with progression of HEP to address strength, balance, and gait for decreased fall risk.  TARGET 09/21/2019    Time  9    Period  Weeks    Status  New      PT LONG TERM GOAL #2   Title  Pt will improve 5x sit<>stand to less than or equal to 12.5 seconds for improved functional strength.    Time  9    Period  Weeks    Status  New      PT LONG TERM GOAL #3   Title  Pt will improve TUG score to less than or equal to 15 seconds for decreased fall risk.    Time  9    Period  Weeks    Status  New      PT LONG TERM GOAL #4   Title  Pt will improve DGI to at least 19/24 for decreased fall risk.    Time  9    Period  Weeks    Status  New      PT LONG TERM GOAL #5   Title  Pt will improve gait velocity to at least 2.3 ft/sec for improved gait efficiency and safety in community.    Time  9    Period  Weeks    Status  New            Plan - 07/27/19 1417    Clinical Impression Statement  Initiated HEP this visit to address hip strength and balance.  He requires UE support for standing exercises at counter, and has increased sway with corner balance  exercises when not using UE support.  He will continue to benefit from skilled PT to work towards improved balance and functional mobility.    Personal Factors and Comorbidities  Comorbidity 3+    Comorbidities  DM, pacemaker, lymphedema in BLEs, FAbry's disease, diastolic heart failure, chronic kidney disease, heart failure, Bell's palsy.    Examination-Activity Limitations  Locomotion Level;Transfers    Examination-Participation Restrictions  Community Activity;Shop;Volunteer    Stability/Clinical Decision Making  Evolving/Moderate complexity    Rehab Potential  Good    PT Frequency  Other (comment)   1x/wk for 1 week, then 2x/wk for 8 weeks   PT Duration  Other (comment)   total POC = 9 weeks   PT Treatment/Interventions  ADLs/Self Care Home Management;Gait training;Stair training;Functional mobility training;Therapeutic activities;Therapeutic exercise;Balance training;Neuromuscular re-education;Patient/family education    PT Next Visit Plan  Review HEP:  hip abductor and glut strengthening, balance exercises to incorporate vestibular system; gait training with cane (may need to lower height of cane)    Consulted and Agree with Plan of Care  Patient  Patient will benefit from skilled therapeutic intervention in order to improve the following deficits and impairments:  Abnormal gait, Difficulty walking, Decreased balance, Decreased mobility, Decreased strength  Visit Diagnosis: Muscle weakness (generalized)  Unsteadiness on feet     Problem List Patient Active Problem List   Diagnosis Date Noted  . 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 CVA (cerebral vascular accident) ([place]) 10/20/2018  . UTI (urinary tract infection) 10/19/2018  . Complete heart block ([place]) 08/01/2018  . Upper airway cough syndrome 08/01/2018  . Lymphedema 10/06/2016  . Gout, arthritis   . 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 Onychomycosis of toenail 09/26/2014  . Diabetes mellitus type 2 in obese ([place])    . Iron deficiency anemia 07/29/2014  . CKD (chronic kidney disease), stage III ([place])   . Chronic diastolic HF (heart failure) ([place]) 03/27/2013  . Peripheral edema 07/20/2012  . Obstructive sleep apnea, suspected 06/21/2012  . Pacemaker-[employer] 02/23/2012  . Hypertension 08/09/2011  . COPD with asthma ([place]) 08/09/2011  . Mobitz type II atrioventricular block 05/19/2011  . 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[place]) 05/19/2011    Ivorie Uplinger W. 07/27/2019, 2:24 PM  Frazier Butt., PT   [facility] [address] [place], [place], [address] Phone: [phone]   Fax:  [phone]  Name: [name] MRN: [id number] Date of Birth: 10-29-1960

## 2019-07-27 NOTE — Patient Instructions (Signed)
Access Code: [email or web address] URL: [email or web address] Date: 07/27/2019 Prepared by: Mady Haagensen  Exercises Standing Hip Abduction with Counter Support - 10 reps - 2 sets - 1x daily - 5x weekly Standing Hip Extension with Counter Support - 10 reps - 2 sets - 1x daily - 5x weekly Single Leg Stance with Support - 3 reps - 1 sets - 10 sec hold - 1x daily - 5x weekly Standing Tandem Balance with Counter Support - 3 reps - 1 sets - 10 sec hold - 1x daily - 5x weekly

## 2019-07-27 NOTE — Progress Notes (Signed)
Virtual Visit via Telephone Note Due to current restrictions/limitations of in-office visits due to the COVID-19 pandemic, this scheduled clinical appointment was converted to a telehealth visit  I connected with [name] on 07/27/19 at  4:10 PM EST by telephone and verified that I am speaking with the correct person using two identifiers.  I am at home.  The patient is at home.  Only the patient and myself participated in this encounter. I discussed the limitations, risks, security and privacy concerns of performing an evaluation and management service by telephone and the availability of in person appointments. I also discussed with the patient that there may be a patient responsible charge related to this service. The patient expressed understanding and agreed to proceed.   History of Present Illness: Patient with history of diabetes type 2 with microalbuminuria, diastolic CHF with pacemaker (secconary to AV block with syncopy), CKD stage III, HOCM, HTN, HL, CVA, lymphedema. 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  Patient last evaluated 12/2018.  Purpose of today's visit is urgent care to address elevated blood sugars.  Patient sent me a MyChart message a few days ago stating that his blood sugars are running high.  He is on Humalog 75/25 20 units twice a day with meals. -Blood sugars over the past few weeks have been in the 200s before breakfast.  This morning blood sugar was 162 which was the lowest it has been in a while.  He gives readings for the past 3 days as 254, 156, 209 all before breakfast.  He denies any changes in his eating habits.  He is eating smaller portions, incorporating fruits and vegetables into his diet and he drinks mainly water.  However he states that he stays hungry all the time.  Outpatient Encounter Medications as of 07/27/2019  Medication Sig Note  . Accu-Chek FastClix Lancets MISC 1 each by Other route 3 (three) times daily.   [name] Kitchen  acetaminophen (TYLENOL) 325 MG tablet Take 1-2 tablets (325-650 mg total) by mouth every 4 (four) hours as needed for mild pain.   . Agalsidase beta (FABRAZYME IV) Inject 115 mg into the vein every 14 (fourteen) days.    [name] Kitchen albuterol (PROAIR HFA) 108 (90 Base) MCG/ACT inhaler Inhale 1 puff into the lungs every 6 (six) hours as needed for wheezing or shortness of breath.   . allopurinol (ZYLOPRIM) 100 MG tablet Take 3 tablets (300 mg total) by mouth daily. Please make PCP appointment for more refills.   [name] Kitchen amLODipine (NORVASC) 10 MG tablet Take 1 tablet (10 mg total) by mouth daily.   [name] Kitchen atorvastatin (LIPITOR) 20 MG tablet Take 20 mg by mouth daily. 05/14/2019: Patient is actively taking 20 mg dosage (Patient to confer with physician and confirm which dose he should be taking 20 mg or 80mg )  . atorvastatin (LIPITOR) 80 MG tablet TAKE 1 TABLET (80 MG TOTAL) BY MOUTH DAILY AT 6 PM.   . clopidogrel (PLAVIX) 75 MG tablet TAKE 1 TABLET BY MOUTH EVERY DAY   . fexofenadine (ALLEGRA) 180 MG tablet Take 1 tablet (180 mg total) by mouth daily. 05/14/2019: Pre med for infusion  . fluticasone (FLONASE) 50 MCG/ACT nasal spray Place 1 spray into both nostrils daily. (Patient taking differently: Place 1 spray into both nostrils daily as needed for allergies. )   . furosemide (LASIX) 20 MG tablet TAKE 1 TABLET BY MOUTH EVERY DAY   . glucose blood (ACCU-CHEK AVIVA PLUS) test strip 1 each by  Other route 3 (three) times daily. Use as instructed   . Insulin Lispro Prot & Lispro (HUMALOG 75/25 MIX) (75-25) 100 UNIT/ML Kwikpen INJECT 20 UNITS INTO THE SKIN 2 TIMES DAILY (Patient taking differently: Inject 20 Units into the skin 2 (two) times daily. Morning & before supper.)   . Insulin Pen Needle (PX SHORTLENGTH PEN NEEDLES) 31G X 8 MM MISC 1 each by Does not apply route 2 (two) times daily before a meal.   . losartan-hydrochlorothiazide (HYZAAR) 50-12.5 MG tablet TAKE 1 TABLET BY MOUTH EVERY DAY   . OLIVE LEAF EXTRACT PO Take 1  capsule by mouth daily.    No facility-administered encounter medications on file as of 07/27/2019.      Observations/Objective: No direct observation done as this was a telephone encounter.  Assessment and Plan: 1. Type 2 diabetes mellitus with other circulatory complication, with long-term current use of insulin (HCC) Uncontrolled. Dietary counseling given. Recommend increasing his insulin to 24 units twice a day. Advised to check blood sugars twice a day before meals.  He will see the clinical pharmacist in 2 weeks with his log.  Otherwise he will follow-up with me in 4 weeks. - Hemoglobin A1c; Future   Follow Up Instructions: See follow-up plan above.   I discussed the assessment and treatment plan with the patient. The patient was provided an opportunity to ask questions and all were answered. The patient agreed with the plan and demonstrated an understanding of the instructions.   The patient was advised to call back or seek an in-person evaluation if the symptoms worsen or if the condition fails to improve as anticipated.  I provided 10 minutes of non-face-to-face time during this encounter.   Karle Plumber, MD

## 2019-07-29 ENCOUNTER — Encounter: Payer: Self-pay | Admitting: Internal Medicine

## 2019-07-30 ENCOUNTER — Ambulatory Visit: Payer: Medicare Other | Admitting: Physical Therapy

## 2019-07-30 MED ORDER — ALLOPURINOL 100 MG PO TABS: 300.0000 mg | ORAL_TABLET | Freq: Every day | ORAL | 1 refills | Status: DC

## 2019-07-31 ENCOUNTER — Other Ambulatory Visit: Payer: Self-pay

## 2019-07-31 ENCOUNTER — Ambulatory Visit: Payer: Medicare Other | Admitting: Physical Therapy

## 2019-07-31 ENCOUNTER — Encounter: Payer: Self-pay | Admitting: Physical Therapy

## 2019-07-31 DIAGNOSIS — R2689 Other abnormalities of gait and mobility: Secondary | ICD-10-CM

## 2019-07-31 DIAGNOSIS — R2681 Unsteadiness on feet: Secondary | ICD-10-CM | POA: Diagnosis not present

## 2019-07-31 DIAGNOSIS — M6281 Muscle weakness (generalized): Secondary | ICD-10-CM | POA: Diagnosis not present

## 2019-07-31 NOTE — Therapy (Signed)
[facility] [address] [place], [place], [address] Phone: [phone]   Fax:  [phone]  Physical Therapy Treatment  Patient Details  Name: [name] MRN: [id number] Date of Birth: 18-Feb-1961 Referring Provider (PT): Frann Rider, NP   Encounter Date: 07/31/2019  PT End of Session - 07/31/19 1321    Visit Number  3    Number of Visits  18    Date for PT Re-Evaluation  10/21/19    Authorization Type  U[employer] Medicare-will need 10th visit progress note    PT Start Time  1150    PT Stop Time  1230    PT Time Calculation (min)  40 min    Activity Tolerance  Patient tolerated treatment well    Behavior During Therapy  [facility] for tasks assessed/performed       Past Medical History:  Diagnosis Date  . Bell's palsy   . Bifascicular block   . Chronic bronchitis ([place])    "seasonal; get it q yr"  . Chronic diastolic CHF (congestive heart failure) ([place]) 2010  . Chronic kidney disease (CKD), stage II (mild)    stage II to III/notes 07/23/2014  . Diastolic heart failure secondary to hypertrophic cardiomyopathy ([place]) CARDIOLOGIST-- DR Daneen Schick  . Dyspnea    increased exertion   . Edema 07/2013  . 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 Gout, arthritis 2014   bil feet. right worse  . History of cellulitis of skin with lymphangitis LEFT LEG  . HOCM (hypertrophic obstructive cardiomyopathy) ([place])    a. Echo 11/16: Severe LVH, EF 55-60%, abnormal GLS consistent with HOCM, no SAM, mild LAE  . Hypertension 20 years  . Lymphedema of lower extremity LEFT  >  RIGHT   "using ankle high socks at home; pump doesn't work for me" (07/23/2014)  . Pacemaker    02/12/11  . Pneumonia 08/2011  . Seasonal asthma NO INHALERS   30 years  . Second degree Mobitz II AV block    with syncope, s/p PPM  . Short of breath on exertion   . Type II diabetes mellitus ([place]) 2012   INSULIN DEPENDENT     Past Surgical History:  Procedure Laterality Date  . CARDIAC CATHETERIZATION  03-12-2011  DR Daneen Schick   HYPERTROPHIC CARDIOMYOPATHY WITH LV CAVITY  APPEARANCE CONSISTANT WITH SIGNIFICANT APICAL HYPERTROPHY/ NORMAL LVSF / EF 55%/ EVIDENCE OF DIASTOLIC DYSFUNCTION WITH EDP OF 23-19mmHg AFTER A-WAVE/ NORMAL CORONARY ARTERIES  . EVALUATION UNDER ANESTHESIA WITH FISTULECTOMY N/A 06/23/2015   Procedure: EXAM UNDER ANESTHESIA WITH POSSIBLE FISTULOTOMY;  Surgeon: Judeth Horn, MD;  Location: [facility];  Service: General;  Laterality: N/A;  . HERNIA REPAIR  [id number]   Umbilical Hernia Repair  . INCISION AND DRAINAGE PERIRECTAL ABSCESS Left 07/29/2014   Procedure: IRRIGATION AND DEBRIDEMENT PERIRECTAL ABSCESS;  Surgeon: Doreen Salvage, MD;  Location: [facility];  Service: General;  Laterality: Left;  Prone position  . INSERT / REPLACE / REMOVE PACEMAKER  02/12/2011   SJM implanted by Dr Rayann Heman for Mobitz II second degree AV block and syncope  . IRRIGATION AND DEBRIDEMENT ABSCESS N/A 07/27/2013   Procedure: IRRIGATION AND DEBRIDEMENT OF SKIN, SOFT TISSUE AND MUSCLES OF UPPER BACK (11X19X4cm) WITH 10 BLADE AND PULSATILE LAVAGE ;  Surgeon: Gayland Curry, MD;  Location: [facility];  Service: General;  Laterality: N/A;  . PPM GENERATOR CHANGEOUT N/A 05/22/2019   Procedure: PPM GENERATOR CHANGEOUT;  Surgeon: Rayann Heman,  Jeneen Rinks, MD;  Location: [facility];  Service: Cardiovascular;  Laterality: N/A;  . UMBILICAL HERNIA REPAIR  03/22/2012   Procedure: HERNIA REPAIR UMBILICAL ADULT;  Surgeon: Leighton Ruff, MD;  Location: [facility];  Service: General;  Laterality: N/A;  Umbilical Hernia Repair     There were no vitals filed for this visit.  Subjective Assessment - 07/31/19 1154    Subjective  Feels like since I've been coming to therapy, these spells are getting worse. Particularly, just feel unsteady.    Patient Stated Goals  To work on this off-balance-ness.             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, he would notice it while on the practice field (well before his CVA)      Oculomotor Exam   Oculomotor Alignment  Normal    Ocular ROM  WNL    Smooth Pursuits  Intact      Vestibulo-Ocular Reflex   VOR 1 Head Only (x 1 viewing)  Performed x 2, 5 reps; minimal symptoms 2nd set   seated   VOR 2 Head and Object (x 2 viewing)  Performed x 5 reps (seated); no symptoms      Visual Acuity   Static  Line 4    Dynamic  Line 1   (2 line difference or less is considered normal)              [facility] Adult PT Treatment/Exercise - 07/31/19 0001      Knee/Hip Exercises: Standing   Hip Abduction  Stengthening;Right;Left;2 sets;10 reps;Knee straight    Hip Extension  Stengthening;Right;Left;10 reps;Knee straight       (Reviewed HEP given last visit-see above, with pt return demo understanding)  Also reviewed the following: Single Leg Stance with Support - performed 2 reps x 10 sec, BUE support Standing Tandem Balance with Counter Support - performed 2 reps x 10 sec, BUE support (explained progression at home could be lessening to 1 UE support)     Balance Exercises - 07/31/19 1212      Balance Exercises: Standing   Standing Eyes Opened  Wide ([place]);Head turns;Solid surface;5 reps;Narrow base of support (BOS);Foam/compliant surface   Head nods   Standing Eyes Closed  Wide (BOA);Narrow base of support (BOS);Solid surface;Foam/compliant surface;1 rep;10 secs;Head turns    Head nods x 5 reps, UE support   Other Standing Exercises  Standing on [employer]:  marching x 10 reps; alternating forward kicks x 5 reps, alternating forward step taps x 5 reps, with counter/chair BUE support        PT Education - 07/31/19 1313    Education Details  Additions to [employer]) Educated  Patient    Methods  Explanation;Demonstration;Handout    Comprehension  Verbalized understanding;Returned demonstration  PT Short Term Goals - 07/23/19 1222      PT SHORT TERM GOAL #1   Title  Pt will be independent with HEP for improved balance, strength, gait for decreased fall risk.  TARGET for all STGS: 08/24/2019    Time  5    Period  Weeks    Status  New      PT SHORT TERM GOAL #2   Title  Pt will improve 5x sit<>stand to less than or equal to 16 seconds for improved functional lower extremity strength.    Time  5    Period  Weeks    Status  New      PT SHORT TERM GOAL #3   Title  Pt will improve TUG score to less than or equal to 18 seconds for decreased fall risk.    Time  5    Period  Weeks    Status  New      PT SHORT TERM GOAL #4   Title  Pt will improve DGI score to at least 16/24 for decreased fall risk.    Time  5    Period  Weeks    Status  New      PT SHORT TERM GOAL #5   Title  Pt will verbalize understanding of fall prevention in home environment.    Time  5    Period  Weeks    Status  New        PT Long Term Goals - 07/23/19 1227      PT LONG TERM GOAL #1   Title  Pt will be independent with progression of HEP to address strength, balance, and gait for decreased fall risk.  TARGET 09/21/2019    Time  9    Period  Weeks    Status  New      PT LONG TERM GOAL #2   Title  Pt will improve 5x sit<>stand to less than or equal to 12.5 seconds for improved functional strength.    Time  9    Period  Weeks    Status  New      PT LONG TERM GOAL #3   Title  Pt will improve TUG score to less than or equal to 15 seconds for decreased fall risk.     Time  9    Period  Weeks    Status  New      PT LONG TERM GOAL #4   Title  Pt will improve DGI to at least 19/24 for decreased fall risk.    Time  9    Period  Weeks    Status  New      PT LONG TERM GOAL #5   Title  Pt will improve gait velocity to at least 2.3 ft/sec for improved gait efficiency and safety in community.    Time  9    Period  Weeks    Status  New            Plan - 07/31/19 1321    Clinical Impression Statement  Pt continues to report episodes of unsteadiness, feeling off balance, especially upon first standing.  Assessed vision/visual acuity, with pt having >2 line difference on between static and dynamic visual acuity.  With x1 and x2 viewing in sitting, and with standing corner exercises, pt does not report the same unsteadiness sensation that he has been experiencing at home.  He uses BUE support for corner and counter balance exercises, and  is more unsteady with UE support lessened or removed.  Pt may have vestibulo-ocular reflex component as well as decreased vestibular system use for balance, which PT is attempting to address through exercises in HEP and therapy sessions.    Personal Factors and Comorbidities  Comorbidity 3+    Comorbidities  DM, pacemaker, lymphedema in BLEs, FAbry's disease, diastolic heart failure, chronic kidney disease, heart failure, Bell's palsy.    Examination-Activity Limitations  Locomotion Level;Transfers    Examination-Participation Restrictions  Community Activity;Shop;Volunteer    Stability/Clinical Decision Making  Evolving/Moderate complexity    Rehab Potential  Good    PT Frequency  Other (comment)   1x/wk for 1 week, then 2x/wk for 8 weeks   PT Duration  Other (comment)   total POC = 9 weeks   PT Treatment/Interventions  ADLs/Self Care Home Management;Gait training;Stair training;Functional mobility training;Therapeutic activities;Therapeutic exercise;Balance training;Neuromuscular re-education;Patient/family education     PT Next Visit Plan  Review HEP updates to corner balance:  hip abductor and glut strengthening, balance exercises to incorporate vestibular system/coordination of head/eye movements; gait training with cane (may need to lower height of cane)    Consulted and Agree with Plan of Care  Patient       Patient will benefit from skilled therapeutic intervention in order to improve the following deficits and impairments:  Abnormal gait, Difficulty walking, Decreased balance, Decreased mobility, Decreased strength  Visit Diagnosis: Muscle weakness (generalized)  Unsteadiness on feet  Other abnormalities of gait and mobility     Problem List Patient Active Problem List   Diagnosis Date Noted  . 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 CVA (cerebral vascular accident) ([place]) 10/20/2018  . UTI (urinary tract infection) 10/19/2018  . Complete heart block ([place]) 08/01/2018  . Upper airway cough syndrome 08/01/2018  . Lymphedema 10/06/2016  . Gout, arthritis   . 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 Onychomycosis of toenail 09/26/2014  . Diabetes mellitus type 2 in obese ([place])   . Iron deficiency anemia 07/29/2014  . CKD (chronic kidney disease), stage III ([place])   . Chronic diastolic HF (heart failure) ([place]) 03/27/2013  . Peripheral edema 07/20/2012  . Obstructive sleep apnea, suspected 06/21/2012  . Pacemaker-St.Jude 02/23/2012  . Hypertension 08/09/2011  . COPD with asthma ([place]) 08/09/2011  . Mobitz type II atrioventricular block 05/19/2011  . 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 07/31/2019, 1:26 PM Frazier Butt., PT  [facility] [address] [place], [place], [address] Phone: [phone]   Fax:  [phone]  Name: [name] MRN: [id number] Date of Birth: 08-31-1960

## 2019-07-31 NOTE — Patient Instructions (Signed)
Access Code: [email or web address] URL: [email or web address] Date: 07/31/2019 Prepared by: Mady Haagensen  Exercises Standing Hip Abduction with Counter Support - 10 reps - 2 sets - 1x daily - 5x weekly Standing Hip Extension with Counter Support - 10 reps - 2 sets - 1x daily - 5x weekly Single Leg Stance with Support - 3 reps - 1 sets - 10 sec hold - 1x daily - 5x weekly Standing Tandem Balance with Counter Support - 3 reps - 1 sets - 10 sec hold - 1x daily - 5x weekly  Added 07/31/2019: Standing Balance in Corner - 5 reps - 2 sets - 1x daily - 5x weekly Standing Near Stance in Corner - 5 reps - 2 sets - 1x daily - 5x weekly

## 2019-08-02 ENCOUNTER — Encounter: Payer: Self-pay | Admitting: Physical Therapy

## 2019-08-02 ENCOUNTER — Ambulatory Visit: Payer: Medicare Other | Admitting: Physical Therapy

## 2019-08-02 ENCOUNTER — Other Ambulatory Visit: Payer: Self-pay

## 2019-08-02 DIAGNOSIS — E7521 Fabry (-Anderson) disease: Secondary | ICD-10-CM | POA: Diagnosis not present

## 2019-08-02 DIAGNOSIS — R2689 Other abnormalities of gait and mobility: Secondary | ICD-10-CM

## 2019-08-02 DIAGNOSIS — R2681 Unsteadiness on feet: Secondary | ICD-10-CM | POA: Diagnosis not present

## 2019-08-02 DIAGNOSIS — M6281 Muscle weakness (generalized): Secondary | ICD-10-CM | POA: Diagnosis not present

## 2019-08-02 NOTE — Therapy (Signed)
[facility] [address] [place], [place], [address] Phone: [phone]   Fax:  [phone]  Physical Therapy Treatment  Patient Details  Name: [name] MRN: [id number] Date of Birth: 11-14-1960 Referring Provider (PT): Frann Rider, NP   Encounter Date: 08/02/2019  PT End of Session - 08/02/19 1127    Visit Number  4    Number of Visits  18    Date for PT Re-Evaluation  10/21/19    Authorization Type  UHC Medicare-will need 10th visit progress note    PT Start Time  0802    PT Stop Time  0845    PT Time Calculation (min)  43 min    Activity Tolerance  Patient tolerated treatment well    Behavior During Therapy  [facility] for tasks assessed/performed       Past Medical History:  Diagnosis Date  . Bell's palsy   . Bifascicular block   . Chronic bronchitis ([place])    "seasonal; get it q yr"  . Chronic diastolic CHF (congestive heart failure) ([place]) 2010  . Chronic kidney disease (CKD), stage II (mild)    stage II to III/notes 07/23/2014  . Diastolic heart failure secondary to hypertrophic cardiomyopathy ([place]) CARDIOLOGIST-- DR Daneen Schick  . Dyspnea    increased exertion   . Edema 07/2013  . 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 Gout, arthritis 2014   bil feet. right worse  . History of cellulitis of skin with lymphangitis LEFT LEG  . HOCM (hypertrophic obstructive cardiomyopathy) ([place])    a. Echo 11/16: Severe LVH, EF 55-60%, abnormal GLS consistent with HOCM, no SAM, mild LAE  . Hypertension 20 years  . Lymphedema of lower extremity LEFT  >  RIGHT   "using ankle high socks at home; pump doesn't work for me" (07/23/2014)  . Pacemaker    02/12/11  . Pneumonia 08/2011  . Seasonal asthma NO INHALERS   30 years  . Second degree Mobitz II AV block    with syncope, s/p PPM  . Short of breath on exertion   . Type II diabetes mellitus ([place]) 2012   INSULIN DEPENDENT     Past Surgical History:  Procedure Laterality Date  . CARDIAC CATHETERIZATION  03-12-2011  DR Daneen Schick   HYPERTROPHIC CARDIOMYOPATHY WITH LV CAVITY  APPEARANCE CONSISTANT WITH SIGNIFICANT APICAL HYPERTROPHY/ NORMAL LVSF / EF 55%/ EVIDENCE OF DIASTOLIC DYSFUNCTION WITH EDP OF 23-89mmHg AFTER A-WAVE/ NORMAL CORONARY ARTERIES  . EVALUATION UNDER ANESTHESIA WITH FISTULECTOMY N/A 06/23/2015   Procedure: EXAM UNDER ANESTHESIA WITH POSSIBLE FISTULOTOMY;  Surgeon: Judeth Horn, MD;  Location: [facility];  Service: General;  Laterality: N/A;  . HERNIA REPAIR  [id number]   Umbilical Hernia Repair  . INCISION AND DRAINAGE PERIRECTAL ABSCESS Left 07/29/2014   Procedure: IRRIGATION AND DEBRIDEMENT PERIRECTAL ABSCESS;  Surgeon: Doreen Salvage, MD;  Location: [facility];  Service: General;  Laterality: Left;  Prone position  . INSERT / REPLACE / REMOVE PACEMAKER  02/12/2011   SJM implanted by Dr Rayann Heman for Mobitz II second degree AV block and syncope  . IRRIGATION AND DEBRIDEMENT ABSCESS N/A 07/27/2013   Procedure: IRRIGATION AND DEBRIDEMENT OF SKIN, SOFT TISSUE AND MUSCLES OF UPPER BACK (11X19X4cm) WITH 10 BLADE AND PULSATILE LAVAGE ;  Surgeon: Gayland Curry, MD;  Location: [facility];  Service: General;  Laterality: N/A;  . PPM GENERATOR CHANGEOUT N/A 05/22/2019   Procedure: PPM GENERATOR CHANGEOUT;  Surgeon: Rayann Heman,  Jeneen Rinks, MD;  Location: [facility];  Service: Cardiovascular;  Laterality: N/A;  . UMBILICAL HERNIA REPAIR  03/22/2012   Procedure: HERNIA REPAIR UMBILICAL ADULT;  Surgeon: Leighton Ruff, MD;  [facility];  Service: General;  Laterality: N/A;  Umbilical Hernia Repair     There were no vitals filed for this visit.  Subjective Assessment - 08/02/19 0805    Subjective  I don't use the cane at home; I just don't feel like my balance is good at all.  I feel the strength in my legs.    Patient Stated Goals  To work on this off-balance-ness.    Currently in Pain?  No/denies                        [facility] Adult PT Treatment/Exercise - 08/02/19 0001      Ambulation/Gait   Ambulation/Gait  Yes    Ambulation/Gait Assistance  5: Supervision    Ambulation/Gait Assistance Details  Cues provided for lateral weightshifting, foot clearance with gait.  With improved foot clearance, SLS when not using cane, pt needs min guard/min assist due to LOB.  Educated pt to use cane, even in home right now as he gets more comfortable with improved foot clearance    Ambulation Distance (Feet)  30 Feet   x 6; then 30 ft x 4 with cane adjusted (lowered)   Assistive device  Straight cane    Gait Pattern  Step-through pattern;Lateral hip instability;Poor foot clearance - left;Poor foot clearance - right;Narrow base of support    Ambulation Surface  Level;Indoor    Pre-Gait Activities  Adjusted cane by lowering cane 2 notches, with pt appearing to have better control, less veering with gait with cane at more appropriate, lowered height.    Gait Comments  Standing with widened BOS, lateral weigthshifting through hips to encourage increased stance time, widened BOS      Self-Care   Self-Care  Other Self-Care Comments    Other Self-Care Comments   Discussed appropriate footwear, as pt's current shoes are more mesh in nature, and his heels are almost out of the shoes.  With standing and balance activities on foam, pt's foot tends to pronate and his heel is out current shoe.  Discussed shoes that would give better overall support, but acknowledged there may be limitations due to his lyphedema and lower leg/ankle swelling.  Discussed options of where to go for better options of shoewear:  Engineer, civil (consulting).          Balance Exercises - 08/02/19 0810      Balance Exercises: Standing   Standing Eyes Opened  Wide (BOA);Head turns;Solid surface;5 reps;Narrow base of support (BOS);Foam/compliant surface   Head nods   Standing Eyes Closed  Wide (BOA);Narrow base of  support (BOS);Solid surface;Foam/compliant surface;1 rep;10 secs;Head turns   Head nods/turns x 5 reps   Tandem Gait  Forward;Foam/compliant surface;2 reps    Retro Gait  Upper extremity support;3 reps;Foam/compliant surface   Forward/back walking along counter   Sidestepping  Foam/compliant support;Upper extremity support;2 reps   Cues for foot clearance   Marching  Foam/compliant surface;Solid surface;Upper extremity assist 1;Static;10 reps   2 sets; pt with LOB, narrowed BOS with marching   Heel Raises  Both;10 reps    Toe Raise  10 reps    Other Standing Exercises  Reviewed HEP from last visit; performed 2nd sets feet apart, feet together EO head motions at counter (  for improved ease of performance at home)        PT Education - 08/02/19 1127    Education Details  Appropriate shoewear for better support; lowered cane to appropriate height    Person(s) Educated  Patient    Methods  Explanation;Handout    Comprehension  Verbalized understanding       PT Short Term Goals - 07/23/19 1222      PT SHORT TERM GOAL #1   Title  Pt will be independent with HEP for improved balance, strength, gait for decreased fall risk.  TARGET for all STGS: 08/24/2019    Time  5    Period  Weeks    Status  New      PT SHORT TERM GOAL #2   Title  Pt will improve 5x sit<>stand to less than or equal to 16 seconds for improved functional lower extremity strength.    Time  5    Period  Weeks    Status  New      PT SHORT TERM GOAL #3   Title  Pt will improve TUG score to less than or equal to 18 seconds for decreased fall risk.    Time  5    Period  Weeks    Status  New      PT SHORT TERM GOAL #4   Title  Pt will improve DGI score to at least 16/24 for decreased fall risk.    Time  5    Period  Weeks    Status  New      PT SHORT TERM GOAL #5   Title  Pt will verbalize understanding of fall prevention in home environment.    Time  5    Period  Weeks    Status  New        PT Long Term  Goals - 07/23/19 1227      PT LONG TERM GOAL #1   Title  Pt will be independent with progression of HEP to address strength, balance, and gait for decreased fall risk.  TARGET 09/21/2019    Time  9    Period  Weeks    Status  New      PT LONG TERM GOAL #2   Title  Pt will improve 5x sit<>stand to less than or equal to 12.5 seconds for improved functional strength.    Time  9    Period  Weeks    Status  New      PT LONG TERM GOAL #3   Title  Pt will improve TUG score to less than or equal to 15 seconds for decreased fall risk.    Time  9    Period  Weeks    Status  New      PT LONG TERM GOAL #4   Title  Pt will improve DGI to at least 19/24 for decreased fall risk.    Time  9    Period  Weeks    Status  New      PT LONG TERM GOAL #5   Title  Pt will improve gait velocity to at least 2.3 ft/sec for improved gait efficiency and safety in community.    Time  9    Period  Weeks    Status  New            Plan - 08/02/19 1128    Clinical Impression Statement  Reviewed recent additions to HEP, and performed at counter (in  addition to corner), as pt feels he may be more likely to do at counter.  Feel pt would be safe doing this as long as he holds to counter for support.  Worked on compliant surfaces for dynamic balance, with pt experiencing LOB with transition to softer, blue mat.  Noted that pt's current shoewear is not giving adequate support, as he is not fully seated in the heel of shoe, which may be contributing to decreased stability.  Also, adjusted (lowered) height of pt's cane, which seems to improve his stability on level ground.    Personal Factors and Comorbidities  Comorbidity 3+    Comorbidities  DM, pacemaker, lymphedema in BLEs, FAbry's disease, diastolic heart failure, chronic kidney disease, heart failure, Bell's palsy.    Examination-Activity Limitations  Locomotion Level;Transfers    Examination-Participation Restrictions  Community Activity;Shop;Volunteer     Stability/Clinical Decision Making  Evolving/Moderate complexity    Rehab Potential  Good    PT Frequency  Other (comment)   1x/wk for 1 week, then 2x/wk for 8 weeks   PT Duration  Other (comment)   total POC = 9 weeks   PT Treatment/Interventions  ADLs/Self Care Home Management;Gait training;Stair training;Functional mobility training;Therapeutic activities;Therapeutic exercise;Balance training;Neuromuscular re-education;Patient/family education    PT Next Visit Plan  Hip strengthening, balance exercises to incorporate vestibular system/coordination of head/eye movements; gait training with cane-solid and compliant surface work; ask about shoewear?    Consulted and Agree with Plan of Care  Patient       Patient will benefit from skilled therapeutic intervention in order to improve the following deficits and impairments:  Abnormal gait, Difficulty walking, Decreased balance, Decreased mobility, Decreased strength  Visit Diagnosis: Unsteadiness on feet  Other abnormalities of gait and mobility     Problem List Patient Active Problem List   Diagnosis Date Noted  . 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 CVA (cerebral vascular accident) ([place]) 10/20/2018  . UTI (urinary tract infection) 10/19/2018  . Complete heart block ([place]) 08/01/2018  . Upper airway cough syndrome 08/01/2018  . Lymphedema 10/06/2016  . Gout, arthritis   . 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 Onychomycosis of toenail 09/26/2014  . Diabetes mellitus type 2 in obese ([place])   . Iron deficiency anemia 07/29/2014  . CKD (chronic kidney disease), stage III ([place])   . Chronic diastolic HF (heart failure) ([place]) 03/27/2013  . Peripheral edema 07/20/2012  . Obstructive sleep apnea, suspected 06/21/2012  . Pacemaker-St.Jude 02/23/2012  . Hypertension 08/09/2011  . COPD with asthma ([place]) 08/09/2011  . Mobitz type II atrioventricular block 05/19/2011  . 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 08/02/2019, 11:34 AM   Frazier Butt., PT   [facility] [address] [place], [place], [address] Phone: [phone]   Fax:  [phone]  Name: [name] MRN: [id number] Date of Birth: [birth year]

## 2019-08-02 NOTE — Patient Instructions (Signed)
[place], [place], [place] [address]  [employer]  [address], [place], [place] [address]

## 2019-08-06 ENCOUNTER — Ambulatory Visit: Payer: Medicare HMO | Admitting: Physical Therapy

## 2019-08-07 ENCOUNTER — Ambulatory Visit: Payer: Medicare HMO | Attending: Adult Health | Admitting: Physical Therapy

## 2019-08-07 ENCOUNTER — Other Ambulatory Visit: Payer: Self-pay

## 2019-08-07 DIAGNOSIS — M6281 Muscle weakness (generalized): Secondary | ICD-10-CM | POA: Diagnosis not present

## 2019-08-07 DIAGNOSIS — R2689 Other abnormalities of gait and mobility: Secondary | ICD-10-CM | POA: Diagnosis not present

## 2019-08-07 DIAGNOSIS — R2681 Unsteadiness on feet: Secondary | ICD-10-CM | POA: Insufficient documentation

## 2019-08-07 NOTE — Therapy (Signed)
[facility] [address] [place], [place], [address] Phone: [phone]   Fax:  [phone]  Physical Therapy Treatment  Patient Details  Name: [name] MRN: [id number] Date of Birth: [birth year] Referring Provider (PT): Frann Rider, NP   Encounter Date: 08/07/2019  PT End of Session - 08/07/19 1426    Visit Number  5    Number of Visits  18    Date for PT Re-Evaluation  10/21/19    Authorization Type  UHC Medicare-will need 10th visit progress note    PT Start Time  0933    PT Stop Time  1014    PT Time Calculation (min)  41 min    Activity Tolerance  Patient tolerated treatment well    Behavior During Therapy  [facility] for tasks assessed/performed       Past Medical History:  Diagnosis Date  . Bell's palsy   . Bifascicular block   . Chronic bronchitis ([place])    "seasonal; get it q yr"  . Chronic diastolic CHF (congestive heart failure) ([place]) 2010  . Chronic kidney disease (CKD), stage II (mild)    stage II to III/notes 07/23/2014  . Diastolic heart failure secondary to hypertrophic cardiomyopathy ([place]) CARDIOLOGIST-- DR Daneen Schick  . Dyspnea    increased exertion   . Edema 07/2013  . 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 Gout, arthritis 2014   bil feet. right worse  . History of cellulitis of skin with lymphangitis LEFT LEG  . HOCM (hypertrophic obstructive cardiomyopathy) ([place])    a. Echo 11/16: Severe LVH, EF 55-60%, abnormal GLS consistent with HOCM, no SAM, mild LAE  . Hypertension 20 years  . Lymphedema of lower extremity LEFT  >  RIGHT   "using ankle high socks at home; pump doesn't work for me" (07/23/2014)  . Pacemaker    02/12/11  . Pneumonia 08/2011  . Seasonal asthma NO INHALERS   30 years  . Second degree Mobitz II AV block    with syncope, s/p PPM  . Short of breath on exertion   . Type II diabetes mellitus ([place]) 2012   INSULIN DEPENDENT     Past Surgical History:  Procedure Laterality Date  . CARDIAC CATHETERIZATION  03-12-2011  DR Daneen Schick   HYPERTROPHIC CARDIOMYOPATHY WITH LV CAVITY  APPEARANCE CONSISTANT WITH SIGNIFICANT APICAL HYPERTROPHY/ NORMAL LVSF / EF 55%/ EVIDENCE OF DIASTOLIC DYSFUNCTION WITH EDP OF 23-87mmHg AFTER A-WAVE/ NORMAL CORONARY ARTERIES  . EVALUATION UNDER ANESTHESIA WITH FISTULECTOMY N/A 06/23/2015   Procedure: EXAM UNDER ANESTHESIA WITH POSSIBLE FISTULOTOMY;  Surgeon: Judeth Horn, MD;  Location: [facility];  Service: General;  Laterality: N/A;  . HERNIA REPAIR  [id number]   Umbilical Hernia Repair  . INCISION AND DRAINAGE PERIRECTAL ABSCESS Left 07/29/2014   Procedure: IRRIGATION AND DEBRIDEMENT PERIRECTAL ABSCESS;  Surgeon: Doreen Salvage, MD;  Location: [facility];  Service: General;  Laterality: Left;  Prone position  . INSERT / REPLACE / REMOVE PACEMAKER  02/12/2011   SJM implanted by Dr Rayann Heman for Mobitz II second degree AV block and syncope  . IRRIGATION AND DEBRIDEMENT ABSCESS N/A 07/27/2013   Procedure: IRRIGATION AND DEBRIDEMENT OF SKIN, SOFT TISSUE AND MUSCLES OF UPPER BACK (11X19X4cm) WITH 10 BLADE AND PULSATILE LAVAGE ;  Surgeon: Gayland Curry, MD;  Location: [facility];  Service: General;  Laterality: N/A;  . PPM GENERATOR CHANGEOUT N/A 05/22/2019   Procedure: PPM GENERATOR CHANGEOUT;  Surgeon: Rayann Heman,  Jeneen Rinks, MD;  Location: [facility];  Service: Cardiovascular;  Laterality: N/A;  . UMBILICAL HERNIA REPAIR  03/22/2012   Procedure: HERNIA REPAIR UMBILICAL ADULT;  Surgeon: Leighton Ruff, MD;  Location: [facility];  Service: General;  Laterality: N/A;  Umbilical Hernia Repair     There were no vitals filed for this visit.  Subjective Assessment - 08/07/19 0935    Subjective  No changes; no falls over the weekend.  Been doing the exercises.    Patient Stated Goals  To work on this off-balance-ness.    Currently in Pain?  No/denies                            Balance Exercises - 08/07/19  0937      Balance Exercises: Standing   Standing Eyes Opened  Narrow base of support (BOS);Solid surface;5 reps;Head turns;Foam/compliant surface   Head nods   Standing Eyes Closed  Narrow base of support (BOS);Head turns;Solid surface;5 reps;Foam/compliant surface   Head nods    Stepping Strategy  Anterior;Posterior;Lateral;Foam/compliant surface;UE support;10 reps   UE support at counter   Step Ups  Forward;Lateral;6 inch;UE support 2   10 reps each   Partial Tandem Stance  Eyes open;Eyes closed;Upper extremity support 2;5 reps;Foam/compliant surface   head turns/head nods; solid surface   Sidestepping  4 reps   REsisted green theraband x 2; then sidestep squats x 2   Other Standing Exercises  SLS step taps:  to 6" step x 10 reps, then to 6", 12", 6" step x 10 reps each leg with UE support      Heel toe raises x 10 reps at counter.  Pt will improved foot clearance with sidestepping this visit.  Sit<>stand at least 5 reps, minimal UE support, throughout session.  Ex for hip stregnthening (sidesteps with resisted band,squat and step ups)    PT Short Term Goals - 07/23/19 1222      PT SHORT TERM GOAL #1   Title  Pt will be independent with HEP for improved balance, strength, gait for decreased fall risk.  TARGET for all STGS: 08/24/2019    Time  5    Period  Weeks    Status  New      PT SHORT TERM GOAL #2   Title  Pt will improve 5x sit<>stand to less than or equal to 16 seconds for improved functional lower extremity strength.    Time  5    Period  Weeks    Status  New      PT SHORT TERM GOAL #3   Title  Pt will improve TUG score to less than or equal to 18 seconds for decreased fall risk.    Time  5    Period  Weeks    Status  New      PT SHORT TERM GOAL #4   Title  Pt will improve DGI score to at least 16/24 for decreased fall risk.    Time  5    Period  Weeks    Status  New      PT SHORT TERM GOAL #5   Title  Pt will verbalize understanding of fall prevention in  home environment.    Time  5    Period  Weeks    Status  New        PT Long Term Goals - 07/23/19 1227      PT LONG TERM GOAL #  1   Title  Pt will be independent with progression of HEP to address strength, balance, and gait for decreased fall risk.  TARGET 09/21/2019    Time  9    Period  Weeks    Status  New      PT LONG TERM GOAL #2   Title  Pt will improve 5x sit<>stand to less than or equal to 12.5 seconds for improved functional strength.    Time  9    Period  Weeks    Status  New      PT LONG TERM GOAL #3   Title  Pt will improve TUG score to less than or equal to 15 seconds for decreased fall risk.    Time  9    Period  Weeks    Status  New      PT LONG TERM GOAL #4   Title  Pt will improve DGI to at least 19/24 for decreased fall risk.    Time  9    Period  Weeks    Status  New      PT LONG TERM GOAL #5   Title  Pt will improve gait velocity to at least 2.3 ft/sec for improved gait efficiency and safety in community.    Time  9    Period  Weeks    Status  New            Plan - 08/07/19 1426    Clinical Impression Statement  Pt reports lowering of cane has helped "100%" with walking.  Today's skilled PT session focused on compliant surface balance exercises as well as funcitonal lower extremity/hip strengthening.  With most balance and standing activities, he requires BUE, at times going down to 1 UE support.  He will continue to benefit from skilled PT to further address balance, strength, gait for imrpoved funcitonal mobility.    Personal Factors and Comorbidities  Comorbidity 3+    Comorbidities  DM, pacemaker, lymphedema in BLEs, FAbry's disease, diastolic heart failure, chronic kidney disease, heart failure, Bell's palsy.    Examination-Activity Limitations  Locomotion Level;Transfers    Examination-Participation Restrictions  Community Activity;Shop;Volunteer    Stability/Clinical Decision Making  Evolving/Moderate complexity    Rehab Potential  Good     PT Frequency  Other (comment)   1x/wk for 1 week, then 2x/wk for 8 weeks   PT Duration  Other (comment)   total POC = 9 weeks   PT Treatment/Interventions  ADLs/Self Care Home Management;Gait training;Stair training;Functional mobility training;Therapeutic activities;Therapeutic exercise;Balance training;Neuromuscular re-education;Patient/family education    PT Next Visit Plan  Continue hip and BLE strengthening, balance exercises to incorporate vestibular system/coordination of head/eye movements; update HEP as needed; gait training with cane-solid and compliant surface work; ask about shoewear?    Consulted and Agree with Plan of Care  Patient       Patient will benefit from skilled therapeutic intervention in order to improve the following deficits and impairments:  Abnormal gait, Difficulty walking, Decreased balance, Decreased mobility, Decreased strength  Visit Diagnosis: Unsteadiness on feet  Muscle weakness (generalized)  Other abnormalities of gait and mobility     Problem List Patient Active Problem List   Diagnosis Date Noted  . 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 CVA (cerebral vascular accident) ([place]) 10/20/2018  . UTI (urinary tract infection) 10/19/2018  . Complete heart block ([place]) 08/01/2018  . Upper airway cough syndrome 08/01/2018  . Lymphedema 10/06/2016  . Gout, arthritis   .  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 Onychomycosis of toenail 09/26/2014  . Diabetes mellitus type 2 in obese ([place])   . Iron deficiency anemia 07/29/2014  . CKD (chronic kidney disease), stage III ([place])   . Chronic diastolic HF (heart failure) ([place]) 03/27/2013  . Peripheral edema 07/20/2012  . Obstructive sleep apnea, suspected 06/21/2012  . Pacemaker-St.Jude 02/23/2012  . Hypertension 08/09/2011  . COPD with asthma ([place]) 08/09/2011  . Mobitz type II atrioventricular block 05/19/2011  . 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 08/07/2019, 2:30 PM  Frazier Butt.,  PT   [facility] [address] [place], [place], [address] Phone: [phone]   Fax:  [phone]  Name: [name] MRN: [id number] Date of Birth: 25-Aug-1960

## 2019-08-08 NOTE — Progress Notes (Signed)
I have reviewed and agreed above plan. 

## 2019-08-09 ENCOUNTER — Ambulatory Visit: Payer: Medicare HMO | Admitting: Physical Therapy

## 2019-08-09 ENCOUNTER — Other Ambulatory Visit: Payer: Self-pay

## 2019-08-09 DIAGNOSIS — M6281 Muscle weakness (generalized): Secondary | ICD-10-CM

## 2019-08-09 DIAGNOSIS — R2681 Unsteadiness on feet: Secondary | ICD-10-CM

## 2019-08-09 DIAGNOSIS — R2689 Other abnormalities of gait and mobility: Secondary | ICD-10-CM | POA: Diagnosis not present

## 2019-08-09 NOTE — Therapy (Signed)
[facility] [address] [place], [place], [address] Phone: [phone]   Fax:  [phone]  Physical Therapy Treatment  Patient Details  Name: [name] MRN: [id number] Date of Birth: 1960-08-15 Referring Provider (PT): Frann Rider, NP   Encounter Date: 08/09/2019  PT End of Session - 08/09/19 1831    Visit Number  6    Number of Visits  18    Date for PT Re-Evaluation  10/21/19    Authorization Type  UHC Medicare-will need 10th visit progress note    PT Start Time  0935    PT Stop Time  1017    PT Time Calculation (min)  42 min    Equipment Utilized During Treatment  Gait belt    Activity Tolerance  Patient tolerated treatment well    Behavior During Therapy  [facility] for tasks assessed/performed       Past Medical History:  Diagnosis Date  . Bell's palsy   . Bifascicular block   . Chronic bronchitis ([place])    "seasonal; get it q yr"  . Chronic diastolic CHF (congestive heart failure) ([place]) 2010  . Chronic kidney disease (CKD), stage II (mild)    stage II to III/notes 07/23/2014  . Diastolic heart failure secondary to hypertrophic cardiomyopathy ([place]) CARDIOLOGIST-- DR Daneen Schick  . Dyspnea    increased exertion   . Edema 07/2013  . 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 Gout, arthritis 2014   bil feet. right worse  . History of cellulitis of skin with lymphangitis LEFT LEG  . HOCM (hypertrophic obstructive cardiomyopathy) ([place])    a. Echo 11/16: Severe LVH, EF 55-60%, abnormal GLS consistent with HOCM, no SAM, mild LAE  . Hypertension 20 years  . Lymphedema of lower extremity LEFT  >  RIGHT   "using ankle high socks at home; pump doesn't work for me" (07/23/2014)  . Pacemaker    02/12/11  . Pneumonia 08/2011  . Seasonal asthma NO INHALERS   30 years  . Second degree Mobitz II AV block    with syncope, s/p PPM  . Short of breath on exertion   . Type II  diabetes mellitus ([place]) 2012   INSULIN DEPENDENT    Past Surgical History:  Procedure Laterality Date  . CARDIAC CATHETERIZATION  03-12-2011  DR Daneen Schick   HYPERTROPHIC CARDIOMYOPATHY WITH LV CAVITY  APPEARANCE CONSISTANT WITH SIGNIFICANT APICAL HYPERTROPHY/ NORMAL LVSF / EF 55%/ EVIDENCE OF DIASTOLIC DYSFUNCTION WITH EDP OF 23-42mmHg AFTER A-WAVE/ NORMAL CORONARY ARTERIES  . EVALUATION UNDER ANESTHESIA WITH FISTULECTOMY N/A 06/23/2015   Procedure: EXAM UNDER ANESTHESIA WITH POSSIBLE FISTULOTOMY;  Surgeon: Judeth Horn, MD;  Location: [facility];  Service: General;  Laterality: N/A;  . HERNIA REPAIR  [id number]   Umbilical Hernia Repair  . INCISION AND DRAINAGE PERIRECTAL ABSCESS Left 07/29/2014   Procedure: IRRIGATION AND DEBRIDEMENT PERIRECTAL ABSCESS;  Surgeon: Doreen Salvage, MD;  Location: [facility];  Service: General;  Laterality: Left;  Prone position  . INSERT / REPLACE / REMOVE PACEMAKER  02/12/2011   SJM implanted by Dr Rayann Heman for Mobitz II second degree AV block and syncope  . IRRIGATION AND DEBRIDEMENT ABSCESS N/A 07/27/2013   Procedure: IRRIGATION AND DEBRIDEMENT OF SKIN, SOFT TISSUE AND MUSCLES OF UPPER BACK (11X19X4cm) WITH 10 BLADE AND PULSATILE LAVAGE ;  Surgeon: Gayland Curry, MD;  Location: [facility];  Service: General;  Laterality: N/A;  . PPM GENERATOR CHANGEOUT N/A  05/22/2019   Procedure: PPM GENERATOR CHANGEOUT;  Surgeon: Thompson Grayer, MD;  Location: [facility];  Service: Cardiovascular;  Laterality: N/A;  . UMBILICAL HERNIA REPAIR  03/22/2012   Procedure: HERNIA REPAIR UMBILICAL ADULT;  Surgeon: Leighton Ruff, MD;  [facility];  Service: General;  Laterality: N/A;  Umbilical Hernia Repair     There were no vitals filed for this visit.  Subjective Assessment - 08/09/19 0937    Subjective  No changes; feel like my balance is a little better, but I need to keep working.    Patient Stated Goals  To work on this off-balance-ness.    Currently in Pain?  No/denies                        [facility] Adult PT Treatment/Exercise - 08/09/19 0001      Ambulation/Gait   Ambulation/Gait  Yes    Ambulation/Gait Assistance  5: Supervision;4: Min guard    Ambulation/Gait Assistance Details  Gait on outdoor, sidewalk surfaces, with several LOB to the R side, requiring pt to stop and regain balance.    Ambulation Distance (Feet)  300 Feet    Assistive device  Straight cane    Gait Pattern  Step-through pattern;Lateral hip instability;Poor foot clearance - left;Poor foot clearance - right;Narrow base of support    Ambulation Surface  Level;Indoor    Gait Comments  Cues with gait to look ahead at target (either at eye level or 10 ft ahead on ground), even with use of visual target, pt veers to R and has to stop to regain balance.          Balance Exercises - 08/09/19 0938      Balance Exercises: Standing   Standing Eyes Opened  Wide (BOA);Narrow base of support (BOS);Foam/compliant surface;Head turns;5 reps   Head nods; 2 fingertip support BUE   Standing Eyes Closed  Wide (BOA);Narrow base of support (BOS);Foam/compliant surface;Head turns;5 reps   Head nods; 2 fingertip support 1-2 UEs   Stepping Strategy  Anterior;Posterior;Lateral;Foam/compliant surface;UE support;10 reps   2 sets; 2nd set alternating legs   Partial Tandem Stance  Eyes open;Eyes closed;5 reps;Foam/compliant surface;Upper extremity support 1   Head turns/head nods; 1 UE support   Sidestepping  3 reps   along counter, green theraband with squats   Heel Raises  Both;10 reps   On Airex, 2 sets   Toe Raise  10 reps   on Airex, 2 sets         PT Short Term Goals - 07/23/19 1222      PT SHORT TERM GOAL #1   Title  Pt will be independent with HEP for improved balance, strength, gait for decreased fall risk.  TARGET for all STGS: 08/24/2019    Time  5    Period  Weeks    Status  New      PT SHORT TERM GOAL #2   Title  Pt will improve 5x sit<>stand to less than or equal to  16 seconds for improved functional lower extremity strength.    Time  5    Period  Weeks    Status  New      PT SHORT TERM GOAL #3   Title  Pt will improve TUG score to less than or equal to 18 seconds for decreased fall risk.    Time  5    Period  Weeks    Status  New  PT SHORT TERM GOAL #4   Title  Pt will improve DGI score to at least 16/24 for decreased fall risk.    Time  5    Period  Weeks    Status  New      PT SHORT TERM GOAL #5   Title  Pt will verbalize understanding of fall prevention in home environment.    Time  5    Period  Weeks    Status  New        PT Long Term Goals - 07/23/19 1227      PT LONG TERM GOAL #1   Title  Pt will be independent with progression of HEP to address strength, balance, and gait for decreased fall risk.  TARGET 09/21/2019    Time  9    Period  Weeks    Status  New      PT LONG TERM GOAL #2   Title  Pt will improve 5x sit<>stand to less than or equal to 12.5 seconds for improved functional strength.    Time  9    Period  Weeks    Status  New      PT LONG TERM GOAL #3   Title  Pt will improve TUG score to less than or equal to 15 seconds for decreased fall risk.    Time  9    Period  Weeks    Status  New      PT LONG TERM GOAL #4   Title  Pt will improve DGI to at least 19/24 for decreased fall risk.    Time  9    Period  Weeks    Status  New      PT LONG TERM GOAL #5   Title  Pt will improve gait velocity to at least 2.3 ft/sec for improved gait efficiency and safety in community.    Time  9    Period  Weeks    Status  New            Plan - 08/09/19 1831    Clinical Impression Statement  With corner balance exercises, pt able to lessen his UE support to 1-2 fingertip support.  He has improved foot clearance with step strategy work on Airex today as well.  However, with gait on outdoor surfaces, pt has multiple episodes of veering to the right, needing to stop to correct his balance.  Will conitnue to work on  hip strengthening as well as dynamic balance and gait.    Personal Factors and Comorbidities  Comorbidity 3+    Comorbidities  DM, pacemaker, lymphedema in BLEs, FAbry's disease, diastolic heart failure, chronic kidney disease, heart failure, Bell's palsy.    Examination-Activity Limitations  Locomotion Level;Transfers    Examination-Participation Restrictions  Community Activity;Shop;Volunteer    Stability/Clinical Decision Making  Evolving/Moderate complexity    Rehab Potential  Good    PT Frequency  Other (comment)   1x/wk for 1 week, then 2x/wk for 8 weeks   PT Duration  Other (comment)   total POC = 9 weeks   PT Treatment/Interventions  ADLs/Self Care Home Management;Gait training;Stair training;Functional mobility training;Therapeutic activities;Therapeutic exercise;Balance training;Neuromuscular re-education;Patient/family education    PT Next Visit Plan  Continue hip and BLE strengthening, consider trying rockerboard in lateral direction, gait at counter with head motions, use of visual target with gait.  Dynamic balance, compliant surfaces    Consulted and Agree with Plan of Care  Patient  Patient will benefit from skilled therapeutic intervention in order to improve the following deficits and impairments:  Abnormal gait, Difficulty walking, Decreased balance, Decreased mobility, Decreased strength  Visit Diagnosis: Unsteadiness on feet  Other abnormalities of gait and mobility  Muscle weakness (generalized)     Problem List Patient Active Problem List   Diagnosis Date Noted  . 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 CVA (cerebral vascular accident) ([place]) 10/20/2018  . UTI (urinary tract infection) 10/19/2018  . Complete heart block ([place]) 08/01/2018  . Upper airway cough syndrome 08/01/2018  . Lymphedema 10/06/2016  . Gout, arthritis   . 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 Onychomycosis of toenail 09/26/2014  . Diabetes mellitus type 2 in obese ([place])   . Iron  deficiency anemia 07/29/2014  . CKD (chronic kidney disease), stage III ([place])   . Chronic diastolic HF (heart failure) ([place]) 03/27/2013  . Peripheral edema 07/20/2012  . Obstructive sleep apnea, suspected 06/21/2012  . Pacemaker-St.Jude 02/23/2012  . Hypertension 08/09/2011  . COPD with asthma ([place]) 08/09/2011  . Mobitz type II atrioventricular block 05/19/2011  . 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 08/09/2019, 6:36 PM  Frazier Butt., PT   [facility] [address] [place], [place], [address] Phone: [phone]   Fax:  [phone]  Name: [name] MRN: [id number] Date of Birth: 1961/05/30

## 2019-08-13 ENCOUNTER — Encounter: Payer: Self-pay | Admitting: Internal Medicine

## 2019-08-13 ENCOUNTER — Other Ambulatory Visit: Payer: Self-pay

## 2019-08-13 ENCOUNTER — Ambulatory Visit: Payer: Medicare HMO | Admitting: Physical Therapy

## 2019-08-13 DIAGNOSIS — R2689 Other abnormalities of gait and mobility: Secondary | ICD-10-CM

## 2019-08-13 DIAGNOSIS — M6281 Muscle weakness (generalized): Secondary | ICD-10-CM | POA: Diagnosis not present

## 2019-08-13 DIAGNOSIS — R2681 Unsteadiness on feet: Secondary | ICD-10-CM | POA: Diagnosis not present

## 2019-08-13 NOTE — Therapy (Signed)
[facility] [address] [place], [place], [address] Phone: [phone]   Fax:  [phone]  Physical Therapy Treatment  Patient Details  Name: [name] MRN: [id number] Date of Birth: 1961-04-13 Referring Provider (PT): Frann Rider, NP   Encounter Date: 08/13/2019  PT End of Session - 08/13/19 1134    Visit Number  7    Number of Visits  18    Date for PT Re-Evaluation  10/21/19    Authorization Type  UHC Medicare-will need 10th visit progress note    PT Start Time  0803    PT Stop Time  0845    PT Time Calculation (min)  42 min    Equipment Utilized During Treatment  Gait belt    Activity Tolerance  Patient tolerated treatment well    Behavior During Therapy  [facility] for tasks assessed/performed       Past Medical History:  Diagnosis Date  . Bell's palsy   . Bifascicular block   . Chronic bronchitis ([place])    "seasonal; get it q yr"  . Chronic diastolic CHF (congestive heart failure) ([place]) 2010  . Chronic kidney disease (CKD), stage II (mild)    stage II to III/notes 07/23/2014  . Diastolic heart failure secondary to hypertrophic cardiomyopathy ([place]) CARDIOLOGIST-- DR Daneen Schick  . Dyspnea    increased exertion   . Edema 07/2013  . 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 Gout, arthritis 2014   bil feet. right worse  . History of cellulitis of skin with lymphangitis LEFT LEG  . HOCM (hypertrophic obstructive cardiomyopathy) ([place])    a. Echo 11/16: Severe LVH, EF 55-60%, abnormal GLS consistent with HOCM, no SAM, mild LAE  . Hypertension 20 years  . Lymphedema of lower extremity LEFT  >  RIGHT   "using ankle high socks at home; pump doesn't work for me" (07/23/2014)  . Pacemaker    02/12/11  . Pneumonia 08/2011  . Seasonal asthma NO INHALERS   30 years  . Second degree Mobitz II AV block    with syncope, s/p PPM  . Short of breath on exertion   . Type II  diabetes mellitus ([place]) 2012   INSULIN DEPENDENT    Past Surgical History:  Procedure Laterality Date  . CARDIAC CATHETERIZATION  03-12-2011  DR Daneen Schick   HYPERTROPHIC CARDIOMYOPATHY WITH LV CAVITY  APPEARANCE CONSISTANT WITH SIGNIFICANT APICAL HYPERTROPHY/ NORMAL LVSF / EF 55%/ EVIDENCE OF DIASTOLIC DYSFUNCTION WITH EDP OF 23-63mmHg AFTER A-WAVE/ NORMAL CORONARY ARTERIES  . EVALUATION UNDER ANESTHESIA WITH FISTULECTOMY N/A 06/23/2015   Procedure: EXAM UNDER ANESTHESIA WITH POSSIBLE FISTULOTOMY;  Surgeon: Judeth Horn, MD;  Location: [facility];  Service: General;  Laterality: N/A;  . HERNIA REPAIR  [id number]   Umbilical Hernia Repair  . INCISION AND DRAINAGE PERIRECTAL ABSCESS Left 07/29/2014   Procedure: IRRIGATION AND DEBRIDEMENT PERIRECTAL ABSCESS;  Surgeon: Doreen Salvage, MD;  Location: [place];  Service: General;  Laterality: Left;  Prone position  . INSERT / REPLACE / REMOVE PACEMAKER  02/12/2011   SJM implanted by Dr Rayann Heman for Mobitz II second degree AV block and syncope  . IRRIGATION AND DEBRIDEMENT ABSCESS N/A 07/27/2013   Procedure: IRRIGATION AND DEBRIDEMENT OF SKIN, SOFT TISSUE AND MUSCLES OF UPPER BACK (11X19X4cm) WITH 10 BLADE AND PULSATILE LAVAGE ;  Surgeon: Gayland Curry, MD;  Location: [facility];  Service: General;  Laterality: N/A;  . PPM GENERATOR CHANGEOUT N/A  05/22/2019   Procedure: PPM GENERATOR CHANGEOUT;  Surgeon: Thompson Grayer, MD;  Location: [place] [facility];  Service: Cardiovascular;  Laterality: N/A;  . UMBILICAL HERNIA REPAIR  03/22/2012   Procedure: HERNIA REPAIR UMBILICAL ADULT;  Surgeon: Leighton Ruff, MD;  [facility];  Service: General;  Laterality: N/A;  Umbilical Hernia Repair     There were no vitals filed for this visit.  Subjective Assessment - 08/13/19 0804    Subjective  No changes, no falls.  Just doing my exercises.    Patient Stated Goals  To work on this off-balance-ness.    Currently in Pain?  No/denies                       [facility] Adult  PT Treatment/Exercise - 08/13/19 0001      Ambulation/Gait   Ambulation/Gait  Yes    Ambulation/Gait Assistance  5: Supervision;4: Min guard    Ambulation Distance (Feet)  80 Feet   x 4   Assistive device  Straight cane    Gait Pattern  Step-through pattern;Lateral hip instability;Poor foot clearance - left;Poor foot clearance - right;Narrow base of support    Ambulation Surface  Level;Indoor    Gait Comments  In parallel bars, 6 x 8 ft, with minimal to no UE support, forward gait, with decreased foot clearance.      Asked patient about getting new footwear, and he hasn't had anyone take him to the [employer] yet.    Balance Exercises - 08/13/19 0806      Balance Exercises: Standing   SLS with Vectors  Solid surface;Upper extremity assist 1;Other reps (comment)   110 reps, consecutive step taps to 6" step, then 12" step   Rockerboard  Anterior/posterior;Lateral   See below   Other Standing Exercises  Rockerboard in anterior/posterior position:  ankle/hip strategy x 10 reps each with UE support; attempted lessening support with alt UE lifts, then arm swing, cues to try to use hip/ankle strategy to correct.  Pt reaches everytime for UE support at bars to correct.  Head turns/head nods x 5 reps with attempts at UEs by his side; pt reaches to bars to correct balance.  Lateral weigthshifting on rockerboard, ankle/hip strategy x 10 reps, then attempt at alt UE support.  With any perturbations, pt reaches for UE support.  In anterior/posterior direction:  forward step taps to floor, x 10 reps with UE support.    Other Standing Exercises Comments  Standing on incline/decline mat surface:  (no UE support on incline, uses cane on decline):  feet apart with head turns, head nods x 10 reps, then eyes closed feet apart x 10 seconds; then feet stagger stance position EO head turns x 10, head nods x 5 reps (on incline only).  PT provides min guard>min assist for balance, especially with transition  positioning, as pt has near LOB, needing therapist assist and support of wall to regain balance.      Standing on balance beam:  Ankle strategy with anterior/posterior weigthshift x 10 reps, then hip strategy with anterior/posterior weightshift through hips x 10 reps in parallel bars with UE support; additional set of 10 reps with combined hip/ankle strategy on beam.  Standing on balance beam, marching in place x 10 reps, then forward step taps x 10 reps with UE support    PT Short Term Goals - 07/23/19 1222      PT SHORT TERM GOAL #1   Title  Pt will  be independent with HEP for improved balance, strength, gait for decreased fall risk.  TARGET for all STGS: 08/24/2019    Time  5    Period  Weeks    Status  New      PT SHORT TERM GOAL #2   Title  Pt will improve 5x sit<>stand to less than or equal to 16 seconds for improved functional lower extremity strength.    Time  5    Period  Weeks    Status  New      PT SHORT TERM GOAL #3   Title  Pt will improve TUG score to less than or equal to 18 seconds for decreased fall risk.    Time  5    Period  Weeks    Status  New      PT SHORT TERM GOAL #4   Title  Pt will improve DGI score to at least 16/24 for decreased fall risk.    Time  5    Period  Weeks    Status  New      PT SHORT TERM GOAL #5   Title  Pt will verbalize understanding of fall prevention in home environment.    Time  5    Period  Weeks    Status  New        PT Long Term Goals - 07/23/19 1227      PT LONG TERM GOAL #1   Title  Pt will be independent with progression of HEP to address strength, balance, and gait for decreased fall risk.  TARGET 09/21/2019    Time  9    Period  Weeks    Status  New      PT LONG TERM GOAL #2   Title  Pt will improve 5x sit<>stand to less than or equal to 12.5 seconds for improved functional strength.    Time  9    Period  Weeks    Status  New      PT LONG TERM GOAL #3   Title  Pt will improve TUG score to less than or equal  to 15 seconds for decreased fall risk.    Time  9    Period  Weeks    Status  New      PT LONG TERM GOAL #4   Title  Pt will improve DGI to at least 19/24 for decreased fall risk.    Time  9    Period  Weeks    Status  New      PT LONG TERM GOAL #5   Title  Pt will improve gait velocity to at least 2.3 ft/sec for improved gait efficiency and safety in community.    Time  9    Period  Weeks    Status  New            Plan - 08/13/19 1135    Clinical Impression Statement  Skilled PT session today focusing on dynamic balance on compliant surfaces; pt with significant difficulty with use of hip/ankle strategy on rockerboard and with unsteadiness without UE support on incline/decline of ramp.  With walking, pt has decreased foot clearance with use of cane; when he works to improve SLS and foot clearance, he becomes off balance.  May discuss trial of walker for longer distances of gait for improved stability and safety with gait.    Personal Factors and Comorbidities  Comorbidity 3+    Comorbidities  DM, pacemaker, lymphedema in  BLEs, FAbry's disease, diastolic heart failure, chronic kidney disease, heart failure, Bell's palsy.    Examination-Activity Limitations  Locomotion Level;Transfers    Examination-Participation Restrictions  Community Activity;Shop;Volunteer    Stability/Clinical Decision Making  Evolving/Moderate complexity    Rehab Potential  Good    PT Frequency  Other (comment)   1x/wk for 1 week, then 2x/wk for 8 weeks   PT Duration  Other (comment)   total POC = 9 weeks   PT Treatment/Interventions  ADLs/Self Care Home Management;Gait training;Stair training;Functional mobility training;Therapeutic activities;Therapeutic exercise;Balance training;Neuromuscular re-education;Patient/family education    PT Next Visit Plan  Check STGs next visit; discuss POC and addition of more appointments (did not realize 3/11 visit was last scheduled).  Discuss/trial use of rollator/RW for  gait on longer distances.  Continue dynamic balance, hip/ankle strategy work, SLS and hip strengthening.    Consulted and Agree with Plan of Care  Patient       Patient will benefit from skilled therapeutic intervention in order to improve the following deficits and impairments:  Abnormal gait, Difficulty walking, Decreased balance, Decreased mobility, Decreased strength  Visit Diagnosis: Unsteadiness on feet  Other abnormalities of gait and mobility     Problem List Patient Active Problem List   Diagnosis Date Noted  . 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 CVA (cerebral vascular accident) ([place]) 10/20/2018  . UTI (urinary tract infection) 10/19/2018  . Complete heart block ([place]) 08/01/2018  . Upper airway cough syndrome 08/01/2018  . Lymphedema 10/06/2016  . Gout, arthritis   . 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 Onychomycosis of toenail 09/26/2014  . Diabetes mellitus type 2 in obese ([place])   . Iron deficiency anemia 07/29/2014  . CKD (chronic kidney disease), stage III ([place])   . Chronic diastolic HF (heart failure) ([place]) 03/27/2013  . Peripheral edema 07/20/2012  . Obstructive sleep apnea, suspected 06/21/2012  . Pacemaker-St.Jude 02/23/2012  . Hypertension 08/09/2011  . COPD with asthma ([place]) 08/09/2011  . Mobitz type II atrioventricular block 05/19/2011  . 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 08/13/2019, 11:40 AM Frazier Butt., PT  [facility] [address] [place], [place], [address] Phone: [phone]   Fax:  [phone]  Name: KELSON QUEENAN MRN: [id number] Date of Birth: [birth year]

## 2019-08-16 ENCOUNTER — Ambulatory Visit: Payer: Medicare HMO | Admitting: Physical Therapy

## 2019-08-16 ENCOUNTER — Other Ambulatory Visit: Payer: Self-pay

## 2019-08-16 DIAGNOSIS — R2681 Unsteadiness on feet: Secondary | ICD-10-CM

## 2019-08-16 DIAGNOSIS — R2689 Other abnormalities of gait and mobility: Secondary | ICD-10-CM

## 2019-08-16 DIAGNOSIS — M6281 Muscle weakness (generalized): Secondary | ICD-10-CM | POA: Diagnosis not present

## 2019-08-16 NOTE — Therapy (Signed)
[facility] [address] [place], [place], [address] Phone: [phone]   Fax:  [phone]  Physical Therapy Treatment  Patient Details  Name: [name] MRN: [id number] Date of Birth: Oct 11, 1960 Referring Provider (PT): Frann Rider, NP   Encounter Date: 08/16/2019  PT End of Session - 08/16/19 1654    Visit Number  8    Number of Visits  18    Date for PT Re-Evaluation  10/21/19    Authorization Type  UHC Medicare-will need 10th visit progress note    PT Start Time  0846    PT Stop Time  0929    PT Time Calculation (min)  43 min    Equipment Utilized During Treatment  Gait belt    Activity Tolerance  Patient tolerated treatment well    Behavior During Therapy  [facility] for tasks assessed/performed       Past Medical History:  Diagnosis Date  . Bell's palsy   . Bifascicular block   . Chronic bronchitis ([place])    "seasonal; get it q yr"  . Chronic diastolic CHF (congestive heart failure) ([place]) 2010  . Chronic kidney disease (CKD), stage II (mild)    stage II to III/notes 07/23/2014  . Diastolic heart failure secondary to hypertrophic cardiomyopathy ([place]) CARDIOLOGIST-- DR Daneen Schick  . Dyspnea    increased exertion   . Edema 07/2013  . 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 Gout, arthritis 2014   bil feet. right worse  . History of cellulitis of skin with lymphangitis LEFT LEG  . HOCM (hypertrophic obstructive cardiomyopathy) ([place])    a. Echo 11/16: Severe LVH, EF 55-60%, abnormal GLS consistent with HOCM, no SAM, mild LAE  . Hypertension 20 years  . Lymphedema of lower extremity LEFT  >  RIGHT   "using ankle high socks at home; pump doesn't work for me" (07/23/2014)  . Pacemaker    02/12/11  . Pneumonia 08/2011  . Seasonal asthma NO INHALERS   30 years  . Second degree Mobitz II AV block    with syncope, s/p PPM  . Short of breath on exertion   . Type II  diabetes mellitus ([place]) 2012   INSULIN DEPENDENT    Past Surgical History:  Procedure Laterality Date  . CARDIAC CATHETERIZATION  03-12-2011  DR Daneen Schick   HYPERTROPHIC CARDIOMYOPATHY WITH LV CAVITY  APPEARANCE CONSISTANT WITH SIGNIFICANT APICAL HYPERTROPHY/ NORMAL LVSF / EF 55%/ EVIDENCE OF DIASTOLIC DYSFUNCTION WITH EDP OF 23-4mHg AFTER A-WAVE/ NORMAL CORONARY ARTERIES  . EVALUATION UNDER ANESTHESIA WITH FISTULECTOMY N/A 06/23/2015   Procedure: EXAM UNDER ANESTHESIA WITH POSSIBLE FISTULOTOMY;  Surgeon: JJudeth Horn MD;  Location: [place]  Service: General;  Laterality: N/A;  . HERNIA REPAIR  [id number]  Umbilical Hernia Repair  . INCISION AND DRAINAGE PERIRECTAL ABSCESS Left 07/29/2014   Procedure: IRRIGATION AND DEBRIDEMENT PERIRECTAL ABSCESS;  Surgeon: JDoreen Salvage MD;  [place]  Service: General;  Laterality: Left;  Prone position  . INSERT / REPLACE / REMOVE PACEMAKER  02/12/2011   SJM implanted by Dr ARayann Hemanfor Mobitz II second degree AV block and syncope  . IRRIGATION AND DEBRIDEMENT ABSCESS N/A 07/27/2013   Procedure: IRRIGATION AND DEBRIDEMENT OF SKIN, SOFT TISSUE AND MUSCLES OF UPPER BACK (11X19X4cm) WITH 10 BLADE AND PULSATILE LAVAGE ;  Surgeon: EGayland Curry MD;  [place]  Service: General;  Laterality: N/A;  . PPM GENERATOR CHANGEOUT N/A  05/22/2019   Procedure: PPM GENERATOR CHANGEOUT;  Surgeon: Thompson Grayer, MD;  Location: [place] [facility];  Service: Cardiovascular;  Laterality: N/A;  . UMBILICAL HERNIA REPAIR  03/22/2012   Procedure: HERNIA REPAIR UMBILICAL ADULT;  Surgeon: Leighton Ruff, MD;  [facility];  Service: General;  Laterality: N/A;  Umbilical Hernia Repair     There were no vitals filed for this visit.  Subjective Assessment - 08/16/19 0847    Subjective  I do feel like it's making a difference.  Sometimes, though all of a sudden, I do still get off balance.    Patient Stated Goals  To work on this off-balance-ness.    Currently in Pain?   No/denies                       [facility] Adult PT Treatment/Exercise - 08/16/19 0850      Transfers   Transfers  Sit to Stand;Stand to Sit    Sit to Stand  5: Supervision;Without upper extremity assist;From chair/3-in-1    Five time sit to stand comments   17.87   16.59 2nd trial   Stand to Sit  5: Supervision;Without upper extremity assist;To chair/3-in-1      Ambulation/Gait   Ambulation/Gait  Yes    Ambulation/Gait Assistance  5: Supervision    Ambulation Distance (Feet)  100 Feet   x 4   Assistive device  Straight cane    Gait Pattern  Step-through pattern;Lateral hip instability;Poor foot clearance - left;Poor foot clearance - right;Narrow base of support    Ambulation Surface  Level;Indoor      Standardized Balance Assessment   Standardized Balance Assessment  Timed Up and Go Test;Dynamic Gait Index      Dynamic Gait Index   Level Surface  Mild Impairment   8.94 sec with cane   Change in Gait Speed  Mild Impairment    Gait with Horizontal Head Turns  Mild Impairment    Gait with Vertical Head Turns  Mild Impairment    Gait and Pivot Turn  Mild Impairment   with cane   Step Over Obstacle  Moderate Impairment    Step Around Obstacles  Mild Impairment    Steps  Mild Impairment    Total Score  15      Timed Up and Go Test   TUG  Normal TUG    Normal TUG (seconds)  14      High Level Balance   High Level Balance Activities  Weight-shifting turns   Quarter turns   High Level Balance Comments  Pt c/o difficulty with balance with turns in kitchen.  At counter, practiced quarter turns, but pt feels off-balance, and pivots second foot instead of lifting it up to complete turn.  Transitioned to lateral weigthshifting, then weigthshifting turns R and L, then full turns, with improved stability with turning intermittent UE support.  Side step/together R and L for additional balance for turns, x 10 reps      Self-Care   Self-Care  Other Self-Care Comments    Other  Self-Care Comments   Fall prevention education provided.  discussed possibility of use of RW for improved ease of gait for longer distances; however, pt does not want to try RW at this time.  Discussed progress towards goals and POC; pt would like to schedule more visits, but pt reports needing to follow-up on some skin issues created by lymphedema in the anterior creases of lower leg/foot area.  PT Education - 08/16/19 1653    Education Details  Fall prevention; discussed POC and progress towards goals.  Discussed pt's followup on lymphedema management (this PT asked Leone Payor at [facility], and no options for PT in [place])-pt to contact MD about management and skin issues    Person(s) Educated  Patient    Methods  Explanation    Comprehension  Verbalized understanding       PT Short Term Goals - 08/16/19 0903      PT SHORT TERM GOAL #1   Title  Pt will be independent with HEP for improved balance, strength, gait for decreased fall risk.  TARGET for all STGS: 08/24/2019    Time  5    Period  Weeks    Status  New      PT SHORT TERM GOAL #2   Title  Pt will improve 5x sit<>stand to less than or equal to 16 seconds for improved functional lower extremity strength.    Baseline  16.59 sec at best 08/16/2019    Time  5    Period  Weeks    Status  Partially Met      PT SHORT TERM GOAL #3   Title  Pt will improve TUG score to less than or equal to 18 seconds for decreased fall risk.    Baseline  TUG 14 sec 08/16/2019    Time  5    Period  Weeks    Status  Achieved      PT SHORT TERM GOAL #4   Title  Pt will improve DGI score to at least 16/24 for decreased fall risk.    Baseline  15/24 08/16/2019    Time  5    Period  Weeks    Status  Partially Met      PT SHORT TERM GOAL #5   Title  Pt will verbalize understanding of fall prevention in home environment.    Time  5    Period  Weeks    Status  Achieved        PT Long Term Goals - 07/23/19 1227      PT LONG  TERM GOAL #1   Title  Pt will be independent with progression of HEP to address strength, balance, and gait for decreased fall risk.  TARGET 09/21/2019    Time  9    Period  Weeks    Status  New      PT LONG TERM GOAL #2   Title  Pt will improve 5x sit<>stand to less than or equal to 12.5 seconds for improved functional strength.    Time  9    Period  Weeks    Status  New      PT LONG TERM GOAL #3   Title  Pt will improve TUG score to less than or equal to 15 seconds for decreased fall risk.    Time  9    Period  Weeks    Status  New      PT LONG TERM GOAL #4   Title  Pt will improve DGI to at least 19/24 for decreased fall risk.    Time  9    Period  Weeks    Status  New      PT LONG TERM GOAL #5   Title  Pt will improve gait velocity to at least 2.3 ft/sec for improved gait efficiency and safety in community.    Time  9  Period  Weeks    Status  New            Plan - 08/16/19 1656    Clinical Impression Statement  Assessed STGs this visit, with pt meeting STG 3 and 5.  STG 2 partially met for 5x sit<>stand, STG 4 partially met for DGI (both improved, but just not to goal level).  STG 1 not yet fully assessed due to time constraints today.  Pt has made some progress with therapy with improved functional strengthening and improved balance.  He continues to be at a fall risk per TUG and DGI scores.  He admits that he is not always wrapping his lower legs and due to lymphedema he is having some skin breakdown at anterior crease of  lower leg/foot.  Lymphedema is a limiting factor and pt agrees to follow up with MD regarding this, prior to return back to clinic for additional PT visits.    Personal Factors and Comorbidities  Comorbidity 3+    Comorbidities  DM, pacemaker, lymphedema in BLEs, FAbry's disease, diastolic heart failure, chronic kidney disease, heart failure, Bell's palsy.    Examination-Activity Limitations  Locomotion Level;Transfers    Examination-Participation  Restrictions  Community Activity;Shop;Volunteer    Stability/Clinical Decision Making  Evolving/Moderate complexity    Rehab Potential  Good    PT Frequency  Other (comment)   1x/wk for 1 week, then 2x/wk for 8 weeks   PT Duration  Other (comment)   total POC = 9 weeks   PT Treatment/Interventions  ADLs/Self Care Home Management;Gait training;Stair training;Functional mobility training;Therapeutic activities;Therapeutic exercise;Balance training;Neuromuscular re-education;Patient/family education    PT Next Visit Plan  Check HEP for STG #1 next visit; follow up re:  did pt see MD regarding lyphedema/skin breakdown?  (No option in [place] per Leone Payor for PT and lyphedema-he could go to [facility], [facility] or [facility]) OR f/u with Wound center.  Continue SLS and hip strengthening, dynamic balance on compliant surfaces, review turns    Consulted and Agree with Plan of Care  Patient       Patient will benefit from skilled therapeutic intervention in order to improve the following deficits and impairments:  Abnormal gait, Difficulty walking, Decreased balance, Decreased mobility, Decreased strength  Visit Diagnosis: Unsteadiness on feet  Other abnormalities of gait and mobility     Problem List Patient Active Problem List   Diagnosis Date Noted  . 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 CVA (cerebral vascular accident) ([place]) 10/20/2018  . UTI (urinary tract infection) 10/19/2018  . Complete heart block ([place]) 08/01/2018  . Upper airway cough syndrome 08/01/2018  . Lymphedema 10/06/2016  . Gout, arthritis   . 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 Onychomycosis of toenail 09/26/2014  . Diabetes mellitus type 2 in obese ([place])   . Iron deficiency anemia 07/29/2014  . CKD (chronic kidney disease), stage III ([place])   . Chronic diastolic HF (heart failure) ([place]) 03/27/2013  . Peripheral edema 07/20/2012  . Obstructive sleep apnea, suspected 06/21/2012  . Pacemaker-St.Jude 02/23/2012  .  Hypertension 08/09/2011  . COPD with asthma ([place]) 08/09/2011  . Mobitz type II atrioventricular block 05/19/2011  . 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 08/16/2019, 5:08 PM  Frazier Butt., PT   [facility] [address] [place], [place], [address] Phone: [phone]   Fax:  [phone]  Name: SAIR FAULCON MRN: [id number] Date of Birth: 13-Apr-1961

## 2019-08-16 NOTE — Patient Instructions (Signed)

## 2019-08-17 ENCOUNTER — Encounter: Payer: Self-pay | Admitting: Internal Medicine

## 2019-08-18 ENCOUNTER — Other Ambulatory Visit: Payer: Self-pay | Admitting: Internal Medicine

## 2019-08-18 DIAGNOSIS — I10 Essential (primary) hypertension: Secondary | ICD-10-CM

## 2019-08-20 ENCOUNTER — Ambulatory Visit (INDEPENDENT_AMBULATORY_CARE_PROVIDER_SITE_OTHER): Payer: Medicare HMO | Admitting: *Deleted

## 2019-08-20 DIAGNOSIS — Z950 Presence of cardiac pacemaker: Secondary | ICD-10-CM | POA: Diagnosis not present

## 2019-08-21 LAB — CUP PACEART REMOTE DEVICE CHECK
Battery Remaining Longevity: 90 mo
Battery Remaining Percentage: 95.5 %
Battery Voltage: 3.02 V
Brady Statistic AP VP Percent: 50 %
Brady Statistic AP VS Percent: 1 %
Brady Statistic AS VP Percent: 50 %
Brady Statistic AS VS Percent: 1 %
Brady Statistic RA Percent Paced: 50 %
Brady Statistic RV Percent Paced: 99 %
Date Time Interrogation Session: 20210315084657
Implantable Lead Implant Date: 20120907
Implantable Lead Implant Date: 20120907
Implantable Lead Location: 753859
Implantable Lead Location: 753860
Implantable Pulse Generator Implant Date: 20201215
Lead Channel Impedance Value: 300 Ohm
Lead Channel Impedance Value: 430 Ohm
Lead Channel Pacing Threshold Amplitude: 0.625 V
Lead Channel Pacing Threshold Amplitude: 1.75 V
Lead Channel Pacing Threshold Pulse Width: 0.5 ms
Lead Channel Pacing Threshold Pulse Width: 0.8 ms
Lead Channel Sensing Intrinsic Amplitude: 5 mV
Lead Channel Setting Pacing Amplitude: 1.625
Lead Channel Setting Pacing Amplitude: 2 V
Lead Channel Setting Pacing Pulse Width: 0.8 ms
Lead Channel Setting Sensing Sensitivity: 4 mV
Pulse Gen Model: 2272
Pulse Gen Serial Number: 9187005

## 2019-08-21 NOTE — Progress Notes (Signed)
PPM Remote  

## 2019-08-22 ENCOUNTER — Telehealth (INDEPENDENT_AMBULATORY_CARE_PROVIDER_SITE_OTHER): Payer: Medicare HMO | Admitting: Internal Medicine

## 2019-08-22 ENCOUNTER — Other Ambulatory Visit: Payer: Self-pay

## 2019-08-22 ENCOUNTER — Encounter: Payer: Self-pay | Admitting: Internal Medicine

## 2019-08-22 VITALS — Ht 70.0 in | Wt 282.0 lb

## 2019-08-22 DIAGNOSIS — I5032 Chronic diastolic (congestive) heart failure: Secondary | ICD-10-CM | POA: Diagnosis not present

## 2019-08-22 DIAGNOSIS — Z950 Presence of cardiac pacemaker: Secondary | ICD-10-CM

## 2019-08-22 DIAGNOSIS — I495 Sick sinus syndrome: Secondary | ICD-10-CM

## 2019-08-22 DIAGNOSIS — I110 Hypertensive heart disease with heart failure: Secondary | ICD-10-CM | POA: Diagnosis not present

## 2019-08-22 DIAGNOSIS — I442 Atrioventricular block, complete: Secondary | ICD-10-CM | POA: Diagnosis not present

## 2019-08-22 DIAGNOSIS — H903 Sensorineural hearing loss, bilateral: Secondary | ICD-10-CM | POA: Diagnosis not present

## 2019-08-22 NOTE — Progress Notes (Signed)
Electrophysiology TeleHealth Note   Due to national recommendations of social distancing due to COVID 19, an audio/video telehealth visit is felt to be most appropriate for this patient at this time.  See MyChart message from today for the patient's consent to telehealth for [facility].  Date:  08/22/2019   ID:  [name], [name], MRN [id number]  Location: patient's home  Provider location:  [facility]  Evaluation Performed: Follow-up visit  PCP:  Ladell Pier, MD   Electrophysiologist:  Dr Rayann Heman  Chief Complaint:  palpitations  History of Present Illness:    [name] is a 59 y.o. male who presents via telehealth conferencing today.  Since his recent generator change, the patient reports doing very well.  Today, he denies symptoms of palpitations, chest pain, shortness of breath,  lower extremity edema, dizziness, presyncope, or syncope.  The patient is otherwise without complaint today.  The patient denies symptoms of fevers, chills, cough, or new SOB worrisome for COVID 19.  Past Medical History:  Diagnosis Date  . Bell's palsy   . Bifascicular block   . Chronic bronchitis ([place])    "seasonal; get it q yr"  . Chronic diastolic CHF (congestive heart failure) ([place]) 2010  . Chronic kidney disease (CKD), stage II (mild)    stage II to III/notes 07/23/2014  . Diastolic heart failure secondary to hypertrophic cardiomyopathy ([place]) CARDIOLOGIST-- DR Daneen Schick  . Dyspnea    increased exertion   . Edema 07/2013  . 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 Gout, arthritis 2014   bil feet. right worse  . History of cellulitis of skin with lymphangitis LEFT LEG  . HOCM (hypertrophic obstructive cardiomyopathy) ([place])    a. Echo 11/16: Severe LVH, EF 55-60%, abnormal GLS consistent with HOCM, no SAM, mild LAE  . Hypertension 20 years  . Lymphedema of lower extremity LEFT  >  RIGHT   "using ankle high socks  at home; pump doesn't work for me" (07/23/2014)  . Pacemaker    02/12/11  . Pneumonia 08/2011  . Seasonal asthma NO INHALERS   30 years  . Second degree Mobitz II AV block    with syncope, s/p PPM  . Short of breath on exertion   . Type II diabetes mellitus ([place]) 2012   INSULIN DEPENDENT    Past Surgical History:  Procedure Laterality Date  . CARDIAC CATHETERIZATION  03-12-2011  DR Daneen Schick   HYPERTROPHIC CARDIOMYOPATHY WITH LV CAVITY  APPEARANCE CONSISTANT WITH SIGNIFICANT APICAL HYPERTROPHY/ NORMAL LVSF / EF 55%/ EVIDENCE OF DIASTOLIC DYSFUNCTION WITH EDP OF 23-21mmHg AFTER A-WAVE/ NORMAL CORONARY ARTERIES  . EVALUATION UNDER ANESTHESIA WITH FISTULECTOMY N/A 06/23/2015   Procedure: EXAM UNDER ANESTHESIA WITH POSSIBLE FISTULOTOMY;  Surgeon: Judeth Horn, MD;  Location: [place];  Service: General;  Laterality: N/A;  . HERNIA REPAIR  [id number]   Umbilical Hernia Repair  . INCISION AND DRAINAGE PERIRECTAL ABSCESS Left 07/29/2014   Procedure: IRRIGATION AND DEBRIDEMENT PERIRECTAL ABSCESS;  Surgeon: Doreen Salvage, MD;  Location: [place];  Service: General;  Laterality: Left;  Prone position  . INSERT / REPLACE / REMOVE PACEMAKER  02/12/2011   SJM implanted by Dr Rayann Heman for Mobitz II second degree AV block and syncope  . IRRIGATION AND DEBRIDEMENT ABSCESS N/A 07/27/2013   Procedure: IRRIGATION AND DEBRIDEMENT OF SKIN, SOFT TISSUE AND MUSCLES OF UPPER BACK (11X19X4cm) WITH 10 BLADE AND PULSATILE LAVAGE ;  Surgeon: Randall Hiss  Ronnie Derby, MD;  Location: [place];  Service: General;  Laterality: N/A;  . PPM GENERATOR CHANGEOUT N/A 05/22/2019   Procedure: PPM GENERATOR CHANGEOUT;  Surgeon: Thompson Grayer, MD;  Location: [place] [facility];  Service: Cardiovascular;  Laterality: N/A;  . UMBILICAL HERNIA REPAIR  03/22/2012   Procedure: HERNIA REPAIR UMBILICAL ADULT;  Surgeon: Leighton Ruff, MD;  [facility];  Service: General;  Laterality: N/A;  Umbilical Hernia Repair     Current Outpatient Medications  Medication Sig  Dispense Refill  . Accu-Chek FastClix Lancets MISC 1 each by Other route 3 (three) times daily. 102 each 12  . acetaminophen (TYLENOL) 325 MG tablet Take 1-2 tablets (325-650 mg total) by mouth every 4 (four) hours as needed for mild pain.    . Agalsidase beta (FABRAZYME IV) Inject 115 mg into the vein every 14 (fourteen) days.     [name] Kitchen albuterol (PROAIR HFA) 108 (90 Base) MCG/ACT inhaler Inhale 1 puff into the lungs every 6 (six) hours as needed for wheezing or shortness of breath. 8.5 Inhaler 1  . allopurinol (ZYLOPRIM) 100 MG tablet Take 3 tablets (300 mg total) by mouth daily. 90 tablet 1  . amLODipine (NORVASC) 10 MG tablet Take 1 tablet (10 mg total) by mouth daily. 30 tablet 6  . atorvastatin (LIPITOR) 20 MG tablet Take 20 mg by mouth daily.    [name] Kitchen atorvastatin (LIPITOR) 80 MG tablet TAKE 1 TABLET (80 MG TOTAL) BY MOUTH DAILY AT 6 PM. 90 tablet 2  . clopidogrel (PLAVIX) 75 MG tablet TAKE 1 TABLET BY MOUTH EVERY DAY 90 tablet 2  . fexofenadine (ALLEGRA) 180 MG tablet Take 1 tablet (180 mg total) by mouth daily. 30 tablet 3  . fluticasone (FLONASE) 50 MCG/ACT nasal spray Place 1 spray into both nostrils daily. (Patient taking differently: Place 1 spray into both nostrils daily as needed for allergies. ) 16 g 1  . furosemide (LASIX) 20 MG tablet TAKE 1 TABLET BY MOUTH EVERY DAY 90 tablet 2  . glucose blood (ACCU-CHEK AVIVA PLUS) test strip 1 each by Other route 3 (three) times daily. Use as instructed 100 each 12  . Insulin Lispro Prot & Lispro (HUMALOG 75/25 MIX) (75-25) 100 UNIT/ML Kwikpen INJECT 20 UNITS INTO THE SKIN 2 TIMES DAILY (Patient taking differently: Inject 20 Units into the skin 2 (two) times daily. Morning & before supper.) 15 mL 6  . Insulin Pen Needle (PX SHORTLENGTH PEN NEEDLES) 31G X 8 MM MISC 1 each by Does not apply route 2 (two) times daily before a meal. 100 each 8  . losartan-hydrochlorothiazide (HYZAAR) 50-12.5 MG tablet Take 1 tablet by mouth daily. Must have office visit for  refills 90 tablet 0  . OLIVE LEAF EXTRACT PO Take 1 capsule by mouth daily.     No current facility-administered medications for this visit.    Allergies:   Lisinopril and Shellfish allergy   Social History:  The patient  reports that he has never smoked. He has never used smokeless tobacco. He reports current alcohol use. He reports that he does not use drugs.   ROS:  Please see the history of present illness.   All other systems are personally reviewed and negative.    Exam:    Vital Signs:  Ht 5\' 10"  (1.778 m)   Wt 282 lb (127.9 kg)   BMI 40.46 kg/m   Well sounding and appearing, alert and conversant, regular work of breathing,  good skin color Eyes- anicteric, neuro- grossly  intact, skin- no apparent rash or lesions or cyanosis, mouth- oral mucosa is pink  Labs/Other Tests and Data Reviewed:    Recent Labs: 02/13/2019: ALT 26 05/22/2019: BUN 36; Creatinine, Ser 2.00; Hemoglobin 16.7; Platelets 193; Potassium 4.0; Sodium 142   Wt Readings from Last 3 Encounters:  08/22/19 282 lb (127.9 kg)  06/04/19 291 lb 9.6 oz (132.3 kg)  05/23/19 280 lb 12.8 oz (127.4 kg)     Last device remote is reviewed from [place] PDF which reveals normal device function, no arrhythmias   ASSESSMENT & PLAN:    1.  Sick sinus syndrome/ complete heart block Remotes are up to date Normal device function No concerns s/p generator change  2. HTN Stable No change required today  3. Chronic diastolic dysfunction Stable No change required today  Follow-up:  12 months with me   Patient Risk:  after full review of this patients clinical status, I feel that they are at moderate risk at this time.  Today, I have spent 15 minutes with the patient with telehealth technology discussing arrhythmia management .    SignedThompson Grayer, MD  08/22/2019 2:25 PM     [address] [place] [id number] [phone] (office) [phone] (fax)

## 2019-08-23 ENCOUNTER — Ambulatory Visit: Payer: Medicare HMO | Attending: Internal Medicine | Admitting: Internal Medicine

## 2019-08-23 ENCOUNTER — Other Ambulatory Visit: Payer: Self-pay

## 2019-08-23 ENCOUNTER — Encounter: Payer: Self-pay | Admitting: Internal Medicine

## 2019-08-23 VITALS — BP 120/81 | HR 66 | Temp 97.7°F | Resp 16 | Wt 294.8 lb

## 2019-08-23 DIAGNOSIS — N183 Chronic kidney disease, stage 3 unspecified: Secondary | ICD-10-CM | POA: Diagnosis not present

## 2019-08-23 DIAGNOSIS — L602 Onychogryphosis: Secondary | ICD-10-CM

## 2019-08-23 DIAGNOSIS — I890 Lymphedema, not elsewhere classified: Secondary | ICD-10-CM | POA: Diagnosis not present

## 2019-08-23 DIAGNOSIS — Z794 Long term (current) use of insulin: Secondary | ICD-10-CM

## 2019-08-23 DIAGNOSIS — E1159 Type 2 diabetes mellitus with other circulatory complications: Secondary | ICD-10-CM | POA: Diagnosis not present

## 2019-08-23 DIAGNOSIS — L97919 Non-pressure chronic ulcer of unspecified part of right lower leg with unspecified severity: Secondary | ICD-10-CM

## 2019-08-23 DIAGNOSIS — I5032 Chronic diastolic (congestive) heart failure: Secondary | ICD-10-CM | POA: Diagnosis not present

## 2019-08-23 DIAGNOSIS — E7521 Fabry (-Anderson) disease: Secondary | ICD-10-CM

## 2019-08-23 DIAGNOSIS — L97929 Non-pressure chronic ulcer of unspecified part of left lower leg with unspecified severity: Secondary | ICD-10-CM

## 2019-08-23 LAB — POCT GLYCOSYLATED HEMOGLOBIN (HGB A1C): HbA1c, POC (controlled diabetic range): 9.3 % — AB (ref 0.0–7.0)

## 2019-08-23 LAB — GLUCOSE, POCT (MANUAL RESULT ENTRY): POC Glucose: 150 mg/dl — AB (ref 70–99)

## 2019-08-23 MED ORDER — MUPIROCIN 2 % EX OINT: TOPICAL_OINTMENT | CUTANEOUS | 0 refills | Status: DC

## 2019-08-23 NOTE — Progress Notes (Signed)
[name], male    DOB: [birth year]  MRN: [id number]  CC: Wound Check (b/l feet), Diabetes, and Hypertension   Subjective: [name] is a 59 y.o. male who presents for chronic ds management His concerns today include:  Patient with history of diabetes type 2 with microalbuminuria, diastolic CHF with pacemaker (secconary to AV block with syncopy), CKD stage III, HOCM, HTN, HL, CVA, lymphedema. 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   Patient had sent me a MyChart message complaining of having wounds produce by his compression socks that do not appear to be healing.  He tells me that the compression socks dig into the skin fold between the lower leg and the top of the anterior ankle.  He admits that he puts on the compression socks and sometimes leave them on for too long.  In this instance he left them on for 2 weeks without ever taking them off.  When he took them off he noted the break in the skin on both feet.  He has been soaking his feet in warm water with Epson salt and using Neosporin cream but it does not appear to be getting better.  There is an odor.  Both legs and feet have been very swollen since he is not been using the compression socks due to the ulcers. -He denies any fever.  No pain or significant drainage.  DM: In regards to his diabetes, he is checking blood sugars 3 times a day.  He did not bring a log with him.  He tells me that blood sugars before breakfast are ranging 100-122, before lunch 137-142 and before dinner 117-128.  He reports compliance with Humalog 75/25 24 units twice a day.  He feels he is doing okay with his eating habits.  HTN/CHF: He reports compliance with his medications including furosemide, Cozaar/HCTZ, and amlodipine.  He limits salt in the foods.  He denies any shortness of breath, PND orthopnea.  No chronic headaches or dizziness.  Patient Active Problem List   Diagnosis Date Noted  . Morbid  obesity ([place]) 08/23/2019  . 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 CVA (cerebral vascular accident) ([place]) 10/20/2018  . UTI (urinary tract infection) 10/19/2018  . Upper airway cough syndrome 08/01/2018  . Lymphedema 10/06/2016  . Gout, arthritis   . 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 Onychomycosis of toenail 09/26/2014  . Diabetes mellitus type 2 in obese ([place])   . Iron deficiency anemia 07/29/2014  . CKD (chronic kidney disease), stage III ([place])   . Chronic diastolic HF (heart failure) ([place]) 03/27/2013  . Peripheral edema 07/20/2012  . Obstructive sleep apnea, suspected 06/21/2012  . Pacemaker-St.Jude 02/23/2012  . Hypertension 08/09/2011  . COPD with asthma ([place]) 08/09/2011  . Mobitz type II atrioventricular block 05/19/2011  . 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[place]) 05/19/2011     Current Outpatient Medications on File Prior to Visit  Medication Sig Dispense Refill  . Accu-Chek FastClix Lancets MISC 1 each by Other route 3 (three) times daily. 102 each 12  . acetaminophen (TYLENOL) 325 MG tablet Take 1-2 tablets (325-650 mg total) by mouth every 4 (four) hours as needed for mild pain.    . Agalsidase beta (FABRAZYME IV) Inject 115 mg into the vein every 14 (fourteen) days.     [name] Kitchen albuterol (PROAIR HFA) 108 (90 Base) MCG/ACT inhaler Inhale 1 puff into the lungs every 6 (six) hours as needed for wheezing or shortness of breath. 8.5 Inhaler  1  . allopurinol (ZYLOPRIM) 100 MG tablet Take 3 tablets (300 mg total) by mouth daily. 90 tablet 1  . amLODipine (NORVASC) 10 MG tablet Take 1 tablet (10 mg total) by mouth daily. 30 tablet 6  . atorvastatin (LIPITOR) 20 MG tablet Take 20 mg by mouth daily.    [name] Kitchen atorvastatin (LIPITOR) 80 MG tablet TAKE 1 TABLET (80 MG TOTAL) BY MOUTH DAILY AT 6 PM. 90 tablet 2  . clopidogrel (PLAVIX) 75 MG tablet TAKE 1 TABLET BY MOUTH EVERY DAY 90 tablet 2  . fexofenadine (ALLEGRA) 180 MG tablet Take 1 tablet (180 mg total) by mouth daily. 30 tablet 3  . fluticasone (FLONASE) 50  MCG/ACT nasal spray Place 1 spray into both nostrils daily. (Patient taking differently: Place 1 spray into both nostrils daily as needed for allergies. ) 16 g 1  . furosemide (LASIX) 20 MG tablet TAKE 1 TABLET BY MOUTH EVERY DAY 90 tablet 2  . glucose blood (ACCU-CHEK AVIVA PLUS) test strip 1 each by Other route 3 (three) times daily. Use as instructed 100 each 12  . Insulin Lispro Prot & Lispro (HUMALOG 75/25 MIX) (75-25) 100 UNIT/ML Kwikpen INJECT 20 UNITS INTO THE SKIN 2 TIMES DAILY (Patient taking differently: Inject 20 Units into the skin 2 (two) times daily. Morning & before supper.) 15 mL 6  . Insulin Pen Needle (PX SHORTLENGTH PEN NEEDLES) 31G X 8 MM MISC 1 each by Does not apply route 2 (two) times daily before a meal. 100 each 8  . losartan-hydrochlorothiazide (HYZAAR) 50-12.5 MG tablet Take 1 tablet by mouth daily. Must have office visit for refills 90 tablet 0  . OLIVE LEAF EXTRACT PO Take 1 capsule by mouth daily.     No current facility-administered medications on file prior to visit.    Allergies  Allergen Reactions  . Lisinopril Anaphylaxis  . Shellfish Allergy Hives and Swelling    Social History   Socioeconomic History  . Marital status: Divorced    Spouse name: Not on file  . Number of children: Not on file  . Years of education: Not on file  . Highest education level: Not on file  Occupational History  . Not on file  Tobacco Use  . Smoking status: Never Smoker  . Smokeless tobacco: Never Used  Substance and Sexual Activity  . Alcohol use: Yes    Comment: 07/23/2014 "might have a few drinks on holidays or at cookouts"  . Drug use: No  . Sexual activity: Yes  Other Topics Concern  . 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 Difficulty of Paying Living Expenses:   Food Insecurity: No Food Insecurity  . Worried About Charity fundraiser in the Last Year: Never true  .  Ran Out of Food in the Last Year: Never true  Transportation Needs: No Transportation Needs  . Lack of Transportation (Medical): No  . Lack of Transportation (Non-Medical): No  Physical Activity: Insufficiently Active  . Days of Exercise per Week: 2 days  . Minutes of Exercise per Session: 20 min  Stress: No Stress Concern Present  . Feeling of Stress : Only a little  Social Connections: Slightly Isolated  . Frequency of Communication with Friends and Family: Twice a week  . Frequency of Social Gatherings with Friends and Family: Once a week  . Attends Religious Services: More than 4 times per  year  . Active Member of Clubs or Organizations: Yes  . Attends Archivist Meetings: 1 to 4 times per year  . Marital Status: Divorced  Human resources officer Violence: Not At Risk  . Fear of Current or Ex-Partner: No  . Emotionally Abused: No  . Physically Abused: No  . Sexually Abused: No    Family History  Problem Relation Age of Onset  . Stroke Mother   . Fabry's disease Mother   . Fabry's disease Brother   . Colon cancer Neg Hx     Past Surgical History:  Procedure Laterality Date  . CARDIAC CATHETERIZATION  03-12-2011  DR Daneen Schick   HYPERTROPHIC CARDIOMYOPATHY WITH LV CAVITY  APPEARANCE CONSISTANT WITH SIGNIFICANT APICAL HYPERTROPHY/ NORMAL LVSF / EF 55%/ EVIDENCE OF DIASTOLIC DYSFUNCTION WITH EDP OF 23-13mmHg AFTER A-WAVE/ NORMAL CORONARY ARTERIES  . EVALUATION UNDER ANESTHESIA WITH FISTULECTOMY N/A 06/23/2015   Procedure: EXAM UNDER ANESTHESIA WITH POSSIBLE FISTULOTOMY;  Surgeon: Judeth Horn, MD;  Location: [place];  Service: General;  Laterality: N/A;  . HERNIA REPAIR  [id number]   Umbilical Hernia Repair  . INCISION AND DRAINAGE PERIRECTAL ABSCESS Left 07/29/2014   Procedure: IRRIGATION AND DEBRIDEMENT PERIRECTAL ABSCESS;  Surgeon: Doreen Salvage, MD;  Location: [place];  Service: General;  Laterality: Left;  Prone position  . INSERT / REPLACE / REMOVE PACEMAKER  02/12/2011   SJM  implanted by Dr Rayann Heman for Mobitz II second degree AV block and syncope  . IRRIGATION AND DEBRIDEMENT ABSCESS N/A 07/27/2013   Procedure: IRRIGATION AND DEBRIDEMENT OF SKIN, SOFT TISSUE AND MUSCLES OF UPPER BACK (11X19X4cm) WITH 10 BLADE AND PULSATILE LAVAGE ;  Surgeon: Gayland Curry, MD;  Location: [place];  Service: General;  Laterality: N/A;  . PPM GENERATOR CHANGEOUT N/A 05/22/2019   Procedure: PPM GENERATOR CHANGEOUT;  Surgeon: Thompson Grayer, MD;  Location: [facility] [facility];  Service: Cardiovascular;  Laterality: N/A;  . UMBILICAL HERNIA REPAIR  03/22/2012   Procedure: HERNIA REPAIR UMBILICAL ADULT;  Surgeon: Leighton Ruff, MD;  [facility];  Service: General;  Laterality: N/A;  Umbilical Hernia Repair     ROS: Review of Systems Negative except as stated above  PHYSICAL EXAM: BP 120/81   Pulse 66   Temp 97.7 F (36.5 C)   Resp 16   Wt 294 lb 12.8 oz (133.7 kg)   SpO2 98%   BMI 42.30 kg/m   Wt Readings from Last 3 Encounters:  08/23/19 294 lb 12.8 oz (133.7 kg)  08/22/19 282 lb (127.9 kg)  06/04/19 291 lb 9.6 oz (132.3 kg)    Physical Exam General appearance - alert, well appearing, obese African-American male and in no distress Mental status - normal mood, behavior, speech, dress, motor activity, and thought processes Chest - clear to auscultation, no wheezes, rales or rhonchi, symmetric air entry Heart - normal rate, regular rhythm, normal S1, S2, no murmurs, rubs, clicks or gallops Extremities/Skin -significant lymphedema in both legs and feet with left leg being worse than the right.  Some blistering on the left leg. Linear break in the skin fold between the lower leg and the foot.  Also base is clean.  No drainage noted.  Slight malodor.  No fluctuance appreciated.  No surrounding erythema.  Toenails are overgrown.      CMP Latest Ref Rng & Units 05/22/2019 05/22/2019 02/13/2019  Glucose 70 - 99 mg/dL 133(H) 138(H) 147(H)  BUN 6 - 20 mg/dL 36(H) 34(H) 30(H)    Creatinine 0.61 - 1.24  mg/dL 2.00(H) 2.23(H) 1.95(H)  Sodium 135 - 145 mmol/L 142 140 145(H)  Potassium 3.5 - 5.1 mmol/L 4.0 6.6(HH) 4.7  Chloride 98 - 111 mmol/L 107 106 105  CO2 22 - 32 mmol/L - 21(L) 20  Calcium 8.9 - 10.3 mg/dL - 9.1 9.7  Total Protein 6.0 - 8.5 g/dL - - 7.3  Total Bilirubin 0.0 - 1.2 mg/dL - - 0.3  Alkaline Phos 39 - 117 IU/L - - 107  AST 0 - 40 IU/L - - 31  ALT 0 - 44 IU/L - - 26   Lipid Panel     Component Value Date/Time   CHOL 232 (H) 10/20/2018 0324   CHOL 280 (H) 10/06/2016 1038   TRIG 89 10/20/2018 0324   HDL 53 10/20/2018 0324   HDL 59 10/06/2016 1038   CHOLHDL 4.4 10/20/2018 0324   VLDL 18 10/20/2018 0324   LDLCALC 161 (H) 10/20/2018 0324   LDLCALC 201 (H) 10/06/2016 1038    CBC    Component Value Date/Time   WBC 7.0 05/22/2019 1201   RBC 5.29 05/22/2019 1201   HGB 16.7 05/22/2019 1321   HGB 15.1 02/13/2019 0854   HCT 49.0 05/22/2019 1321   HCT 45.3 02/13/2019 0854   PLT 193 05/22/2019 1201   PLT 221 02/13/2019 0854   MCV 91.3 05/22/2019 1201   MCV 88 02/13/2019 0854   MCH 29.3 05/22/2019 1201   MCHC 32.1 05/22/2019 1201   RDW 13.3 05/22/2019 1201   RDW 13.8 02/13/2019 0854   LYMPHSABS 2.3 10/24/2018 0515   MONOABS 1.3 (H) 10/24/2018 0515   EOSABS 0.3 10/24/2018 0515   BASOSABS 0.0 10/24/2018 0515   Results for orders placed or performed in visit on 08/23/19  POCT glucose (manual entry)  Result Value Ref Range   POC Glucose 150 (A) 70 - 99 mg/dl  POCT glycosylated hemoglobin (Hb A1C)  Result Value Ref Range   Hemoglobin A1C     HbA1c POC (<> result, manual entry)     HbA1c, POC (prediabetic range)     HbA1c, POC (controlled diabetic range) 9.3 (A) 0.0 - 7.0 %    ASSESSMENT AND PLAN: 1. Ulcers of both lower legs ([place]) We need to get him in with the wound clinic as soon as possible.  In the meantime have given some Bactroban ointment and prescription for sterile water and 4 x 4 gauze.  Instructed patient to clean these  ulcers daily and then apply the Bactroban ointment and then put a clean dry gauze between the skin folds. -He does have his wraps at home.  I recommend that he wrap the lower legs every day with Ace wraps to help decrease the edema. - Ambulatory referral to Podiatry - AMB referral to wound care center - CBC With Differential - mupirocin ointment (BACTROBAN) 2 %; Apply to affected area daily with dressing change.  Dispense: 30 g; Refill: 0  2. Type 2 diabetes mellitus with other circulatory complication, with long-term current use of insulin ([place]) Reported blood sugar readings are good but his A1c does not reflect his reported readings.  We have given him a sheet to keep a log of his blood sugar readings.  He will follow-up with the clinical pharmacist next week with these readings for review. - POCT glucose (manual entry) - POCT glycosylated hemoglobin (Hb A1C) - Microalbumin / creatinine urine ratio - Comprehensive metabolic panel - Lipid panel  3. Chronic diastolic (congestive) heart failure (HCC) Continue current medications  4. Fabry  disease [facility]) He gets infusions for this through his nephrologist.  5. Lymphedema Needs to have the legs wrap.  Advised him to use Ace wraps for now until he gets into the wound clinic  6. Overgrown toenails - Ambulatory referral to Podiatry    Patient was given the opportunity to ask questions.  Patient verbalized understanding of the plan and was able to repeat key elements of the plan.   Orders Placed This Encounter  Procedures  . Microalbumin / creatinine urine ratio  . Comprehensive metabolic panel  . Lipid panel  . CBC With Differential  . Ambulatory referral to Podiatry  . AMB referral to wound care center  . POCT glucose (manual entry)  . POCT glycosylated hemoglobin (Hb A1C)     Requested Prescriptions   Signed Prescriptions Disp Refills  . mupirocin ointment (BACTROBAN) 2 % 30 g 0    Sig: Apply to affected area daily with  dressing change.    Return in about 6 weeks (around 10/04/2019).  Karle Plumber, MD, FACP

## 2019-08-23 NOTE — Patient Instructions (Addendum)
Please give patient an appointment with the clinical pharmacist in 1 week for recheck of blood sugars.  Do dressing changes on the legs as discussed daily..  You have been referred to wound care.

## 2019-08-24 ENCOUNTER — Encounter: Payer: Self-pay | Admitting: Internal Medicine

## 2019-08-24 LAB — CBC WITH DIFFERENTIAL
Basophils Absolute: 0.1 10*3/uL (ref 0.0–0.2)
Basos: 1 %
EOS (ABSOLUTE): 0.3 10*3/uL (ref 0.0–0.4)
Eos: 4 %
Hematocrit: 44.2 % (ref 37.5–51.0)
Hemoglobin: 14.7 g/dL (ref 13.0–17.7)
Immature Grans (Abs): 0 10*3/uL (ref 0.0–0.1)
Immature Granulocytes: 0 %
Lymphocytes Absolute: 2.9 10*3/uL (ref 0.7–3.1)
Lymphs: 39 %
MCH: 28.9 pg (ref 26.6–33.0)
MCHC: 33.3 g/dL (ref 31.5–35.7)
MCV: 87 fL (ref 79–97)
Monocytes Absolute: 0.6 10*3/uL (ref 0.1–0.9)
Monocytes: 9 %
Neutrophils Absolute: 3.5 10*3/uL (ref 1.4–7.0)
Neutrophils: 47 %
RBC: 5.09 x10E6/uL (ref 4.14–5.80)
RDW: 13.2 % (ref 11.6–15.4)
WBC: 7.4 10*3/uL (ref 3.4–10.8)

## 2019-08-24 LAB — LIPID PANEL
Chol/HDL Ratio: 2.7 ratio (ref 0.0–5.0)
Cholesterol, Total: 159 mg/dL (ref 100–199)
HDL: 59 mg/dL (ref >39)
LDL Chol Calc (NIH): 87 mg/dL (ref 0–99)
Triglycerides: 63 mg/dL (ref 0–149)
VLDL Cholesterol Cal: 13 mg/dL (ref 5–40)

## 2019-08-24 LAB — COMPREHENSIVE METABOLIC PANEL
ALT: 43 IU/L (ref 0–44)
AST: 41 IU/L — ABNORMAL HIGH (ref 0–40)
Albumin/Globulin Ratio: 1.1 — ABNORMAL LOW (ref 1.2–2.2)
Albumin: 3.9 g/dL (ref 3.8–4.9)
Alkaline Phosphatase: 126 IU/L — ABNORMAL HIGH (ref 39–117)
BUN/Creatinine Ratio: 16 (ref 9–20)
BUN: 32 mg/dL — ABNORMAL HIGH (ref 6–24)
Bilirubin Total: 0.4 mg/dL (ref 0.0–1.2)
CO2: 21 mmol/L (ref 20–29)
Calcium: 9.8 mg/dL (ref 8.7–10.2)
Chloride: 105 mmol/L (ref 96–106)
Creatinine, Ser: 2.02 mg/dL — ABNORMAL HIGH (ref 0.76–1.27)
GFR calc Af Amer: 41 mL/min/{1.73_m2} — ABNORMAL LOW (ref >59)
GFR calc non Af Amer: 35 mL/min/{1.73_m2} — ABNORMAL LOW (ref >59)
Globulin, Total: 3.6 g/dL (ref 1.5–4.5)
Glucose: 147 mg/dL — ABNORMAL HIGH (ref 65–99)
Potassium: 4.6 mmol/L (ref 3.5–5.2)
Sodium: 143 mmol/L (ref 134–144)
Total Protein: 7.5 g/dL (ref 6.0–8.5)

## 2019-08-24 LAB — MICROALBUMIN / CREATININE URINE RATIO
Creatinine, Urine: 50.7 mg/dL
Microalb/Creat Ratio: 411 mg/g creat — ABNORMAL HIGH (ref 0–29)
Microalbumin, Urine: 208.2 ug/mL

## 2019-08-25 ENCOUNTER — Other Ambulatory Visit: Payer: Self-pay | Admitting: Internal Medicine

## 2019-08-28 DIAGNOSIS — E7521 Fabry (-Anderson) disease: Secondary | ICD-10-CM | POA: Diagnosis not present

## 2019-09-01 ENCOUNTER — Encounter: Payer: Self-pay | Admitting: Internal Medicine

## 2019-09-03 ENCOUNTER — Ambulatory Visit: Payer: Medicare Other | Admitting: Adult Health

## 2019-09-03 ENCOUNTER — Encounter: Payer: Self-pay | Admitting: Adult Health

## 2019-09-03 ENCOUNTER — Encounter: Payer: Self-pay | Admitting: Internal Medicine

## 2019-09-03 DIAGNOSIS — H903 Sensorineural hearing loss, bilateral: Secondary | ICD-10-CM | POA: Diagnosis not present

## 2019-09-03 NOTE — Progress Notes (Deleted)
[facility] [address] [place]. [place] [address] [phone]       OFFICE FOLLOW UP NOTE  [name] Date of Birth:  [birth year] Medical Record Number:  [id number]   Reason for visit: Stroke follow-up   CHIEF COMPLAINT:  No chief complaint on file.   HPI:  [name] is a 59 year old male who is being seen today, 09/03/2019, for stroke follow-up.  During prior visit, concern of decreased vision, worsening imbalance and vertigo therefore CT head obtained which did not show any acute abnormalities.  Unable to obtain MRI due to pacemaker.  He continues to work with PT with ongoing improvement of balance.  Continues on Plavix and atorvastatin for secondary stroke prevention without side effects.  Blood pressure today ***.  Glucose levels stable.  Continues to follow with PCP regularly for chronic disease management.  Continues to follow with Dr. Krista Blue in this office for history of Fabry's disease.  No further concerns at this time.     History: Provided for reference purposes only Stroke admission 10/19/2018: [name] is a 59 y.o. male with history of diabetes, second-degree Mobitz with AV block requiring a pacemaker which is not MRI compatible, lymphedema in the lower extremities, HOCM, Fabry'sdisease, diastolic heart failure, chronic kidney disease, heart failure, and Bell's palsy  who presented on 10/19/2018 with headache, left facial paresthesias and pain behind his eyes.  Stroke work-up likely brainstem lacunar infarct secondary to small vessel disease too small to be seen on imaging could be related to Fabry's disease.  CT head did show old left pontine lacune which was new finding since prior imaging in 2012.  TCD showed diffuse intracranial atherosclerosis.  Carotid Doppler and 2D echo unremarkable.  HIV negative.  UDS negative.  LDL 161 and A1c 7.4.  Recommended DAPT for 3 weeks and Plavix alone.  He continues to follow with Dr. Krista Blue at [facility] for  ongoing management of Fabry's disease and recommended holding Fabrazyme in setting of acute illness and to resume 2 weeks prior nephrology input.  HTN stable.  Increase atorvastatin dose from 20 mg to 80 mg for HLD management.  Ongoing follow-up with PCP for DM management.  Other stroke risk factors include EtOH use, morbid obesity, family history of stroke, chronic diastolic CHF and hypertrophic obstructive cardiomyopathy with EF of 55 to 60%.  He was also found to have staph UTI and was treated with ceftriaxone.  Residual deficit of ataxia with diplopia and balance deficits and therefore discharged to CIR for ongoing therapy.  He was discharged home on 11/02/2018.   Virtual visit 12/04/2018 JM: Residual deficits of mild gait difficulties, left cheek numbness sensation and blurred vision upon awakening but improved through out the day - therapies completed after 2 visits - he also felt as though this was not beneficial for him as he was having them do exercises he learned during admission which he continued to do on his own at home He lives independently where he has returned back to all prior ADLs - he has been retired since 2013; he has not returned to driving at this time due to diplopia - he has not followed with ophthalmology at this time Completed 3 weeks DAPT and continues on Plavix alone without side effects of bleeding or bruising Continues on atorvastatin 80 mg daily without side effects myalgias Blood pressure - plans on obtaining his own cuff to monitor at home  He has resumed Fabrazyme on 11/17/2018 No further concerns at  this time  Update 03/07/2019 JM: [name] is being seen today for stroke follow-up.  He has been doing well form a stroke standpoint with only intermittent diplopia which typically worsens with bright light.  Continues to use cane for occasional fatigue/"winded" with prolonged ambulation but this has been stable without worsening. He continues to do HEP at home routinely.  Continues on Plavix and atorvastatin for secondary stroke prevention without side effects.  Glucose levels have been stable.  Blood pressure today satisfactory at 122/83.  Denies new or worsening stroke/TIA symptoms. He continues to receive Fabrazyme infusions for Fabry disease and follows with Dr. Krista Blue routinely.   Update 06/04/2019 JM: [name] is a 59 year old who is being seen today for stroke follow up as well as request by opthomology due to worsening diplopia.  Patient states he had acute onset of worsening double vision, imbalance and vertigo on 04/06/2019.  He was previously recovering well slowly returning back to prior activities.  He was evaluated with ophthalmology and concern for possible new stroke.  He unfortunately did have a fall on Christmas Eve when he went to stand up from a chair but went straight forward falling onto his face.  He reports he had a sensation as though he was being pushed down or pulled towards his left side.  He denies any injury, headache or worsening/new neurological symptoms.  He was advised by his PCP to be evaluated in the ER immediately but this had not been done for unknown reasons.  He does report improvement of vision since that time but has concerns regarding continued imbalance and vertigo.  He continues to follow with Dr. Krista Blue in his office for history of Fabrys disease receiving Fabrazyme infusions twice weekly.  He plans on undergoing CT head tomorrow to rule out possible new or worsening stroke.  He denies any increased stress, infections, illness, medication changes or trauma that occurred prior to worsening of prior symptoms.  Blood pressure today 122/74.  He has continued on Plavix and atorvastatin for secondary stroke prevention without side effects.  Glucose levels have been slightly elevated over the past 2 weeks.  He continues to follow PCP for HTN, HLD and DM.  No further concerns at this time.         ROS:   14 system review of systems  performed and negative with exception of diplopia, balance difficulties, lymphedema, vertigo, gait difficulty   PMH:  Past Medical History:  Diagnosis Date  . Bell's palsy   . Bifascicular block   . Chronic bronchitis ([place])    "seasonal; get it q yr"  . Chronic diastolic CHF (congestive heart failure) ([place]) 2010  . Chronic kidney disease (CKD), stage II (mild)    stage II to III/notes 07/23/2014  . Diastolic heart failure secondary to hypertrophic cardiomyopathy ([place]) CARDIOLOGIST-- DR Daneen Schick  . Dyspnea    increased exertion   . Edema 07/2013  . Fabry's disease ([place]) RENAL AND CARDIAC INVOVLEMENT   FOLLOWED DR [place]  . Gout, arthritis 2014   bil feet. right worse  . History of cellulitis of skin with lymphangitis LEFT LEG  . HOCM (hypertrophic obstructive cardiomyopathy) ([place])    a. Echo 11/16: Severe LVH, EF 55-60%, abnormal GLS consistent with HOCM, no SAM, mild LAE  . Hypertension 20 years  . Lymphedema of lower extremity LEFT  >  RIGHT   "using ankle high socks at home; pump doesn't work for me" (07/23/2014)  . Pacemaker  02/12/11  . Pneumonia 08/2011  . Seasonal asthma NO INHALERS   30 years  . Second degree Mobitz II AV block    with syncope, s/p PPM  . Short of breath on exertion   . Type II diabetes mellitus ([place]) 2012   INSULIN DEPENDENT    PSH:  Past Surgical History:  Procedure Laterality Date  . CARDIAC CATHETERIZATION  03-12-2011  DR Daneen Schick   HYPERTROPHIC CARDIOMYOPATHY WITH LV CAVITY  APPEARANCE CONSISTANT WITH SIGNIFICANT APICAL HYPERTROPHY/ NORMAL LVSF / EF 55%/ EVIDENCE OF DIASTOLIC DYSFUNCTION WITH EDP OF 23-2mmHg AFTER A-WAVE/ NORMAL CORONARY ARTERIES  . EVALUATION UNDER ANESTHESIA WITH FISTULECTOMY N/A 06/23/2015   Procedure: EXAM UNDER ANESTHESIA WITH POSSIBLE FISTULOTOMY;  Surgeon: Judeth Horn, MD;  Location: [place];  Service: General;  Laterality: N/A;  . HERNIA REPAIR  [id number]   Umbilical Hernia Repair  . INCISION AND DRAINAGE PERIRECTAL  ABSCESS Left 07/29/2014   Procedure: IRRIGATION AND DEBRIDEMENT PERIRECTAL ABSCESS;  Surgeon: Doreen Salvage, MD;  Location: [place];  Service: General;  Laterality: Left;  Prone position  . INSERT / REPLACE / REMOVE PACEMAKER  02/12/2011   SJM implanted by Dr Rayann Heman for Mobitz II second degree AV block and syncope  . IRRIGATION AND DEBRIDEMENT ABSCESS N/A 07/27/2013   Procedure: IRRIGATION AND DEBRIDEMENT OF SKIN, SOFT TISSUE AND MUSCLES OF UPPER BACK (11X19X4cm) WITH 10 BLADE AND PULSATILE LAVAGE ;  Surgeon: Gayland Curry, MD;  Location: [place];  Service: General;  Laterality: N/A;  . PPM GENERATOR CHANGEOUT N/A 05/22/2019   Procedure: PPM GENERATOR CHANGEOUT;  Surgeon: Thompson Grayer, MD;  Location: [place] [facility];  Service: Cardiovascular;  Laterality: N/A;  . UMBILICAL HERNIA REPAIR  03/22/2012   Procedure: HERNIA REPAIR UMBILICAL ADULT;  Surgeon: Leighton Ruff, MD;  Location: [facility];  Service: General;  Laterality: N/A;  Umbilical Hernia Repair     Social History:  Social History   Socioeconomic History  . Marital status: Divorced    Spouse name: Not on file  . Number of children: Not on file  . Years of education: Not on file  . Highest education level: Not on file  Occupational History  . Not on file  Tobacco Use  . Smoking status: Never Smoker  . Smokeless tobacco: Never Used  Substance and Sexual Activity  . Alcohol use: Yes    Comment: 07/23/2014 "might have a few drinks on holidays or at cookouts"  . Drug use: No  . Sexual activity: Yes  Other Topics Concern  . 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 Difficulty of Paying Living Expenses:   Food Insecurity: No Food Insecurity  . Worried About Charity fundraiser in the Last Year: Never true  . Ran Out of Food in the Last Year: Never true  Transportation Needs: No Transportation Needs  . Lack of Transportation (Medical): No  . Lack  of Transportation (Non-Medical): No  Physical Activity: Insufficiently Active  . Days of Exercise per Week: 2 days  . Minutes of Exercise per Session: 20 min  Stress: No Stress Concern Present  . Feeling of Stress : Only a little  Social Connections: Slightly Isolated  . Frequency of Communication with Friends and Family: Twice a week  . Frequency of Social Gatherings with Friends and Family: Once a week  . Attends Religious Services: More than 4 times per year  .  Active Member of Clubs or Organizations: Yes  . Attends Archivist Meetings: 1 to 4 times per year  . Marital Status: Divorced  Human resources officer Violence: Not At Risk  . Fear of Current or Ex-Partner: No  . Emotionally Abused: No  . Physically Abused: No  . Sexually Abused: No    Family History:  Family History  Problem Relation Age of Onset  . Stroke Mother   . Fabry's disease Mother   . Fabry's disease Brother   . Colon cancer Neg Hx     Medications:   Current Outpatient Medications on File Prior to Visit  Medication Sig Dispense Refill  . Accu-Chek FastClix Lancets MISC 1 each by Other route 3 (three) times daily. 102 each 12  . acetaminophen (TYLENOL) 325 MG tablet Take 1-2 tablets (325-650 mg total) by mouth every 4 (four) hours as needed for mild pain.    . Agalsidase beta (FABRAZYME IV) Inject 115 mg into the vein every 14 (fourteen) days.     [name] Kitchen albuterol (PROAIR HFA) 108 (90 Base) MCG/ACT inhaler Inhale 1 puff into the lungs every 6 (six) hours as needed for wheezing or shortness of breath. 8.5 Inhaler 1  . allopurinol (ZYLOPRIM) 100 MG tablet TAKE 3 TABLETS BY MOUTH DAILY 90 tablet 1  . amLODipine (NORVASC) 10 MG tablet Take 1 tablet (10 mg total) by mouth daily. 30 tablet 6  . atorvastatin (LIPITOR) 20 MG tablet Take 20 mg by mouth daily.    [name] Kitchen atorvastatin (LIPITOR) 80 MG tablet TAKE 1 TABLET (80 MG TOTAL) BY MOUTH DAILY AT 6 PM. 90 tablet 2  . clopidogrel (PLAVIX) 75 MG tablet TAKE 1 TABLET BY  MOUTH EVERY DAY 90 tablet 2  . fexofenadine (ALLEGRA) 180 MG tablet Take 1 tablet (180 mg total) by mouth daily. 30 tablet 3  . fluticasone (FLONASE) 50 MCG/ACT nasal spray Place 1 spray into both nostrils daily. (Patient taking differently: Place 1 spray into both nostrils daily as needed for allergies. ) 16 g 1  . furosemide (LASIX) 20 MG tablet TAKE 1 TABLET BY MOUTH EVERY DAY 90 tablet 2  . glucose blood (ACCU-CHEK AVIVA PLUS) test strip 1 each by Other route 3 (three) times daily. Use as instructed 100 each 12  . Insulin Lispro Prot & Lispro (HUMALOG 75/25 MIX) (75-25) 100 UNIT/ML Kwikpen INJECT 20 UNITS INTO THE SKIN 2 TIMES DAILY (Patient taking differently: Inject 20 Units into the skin 2 (two) times daily. Morning & before supper.) 15 mL 6  . Insulin Pen Needle (PX SHORTLENGTH PEN NEEDLES) 31G X 8 MM MISC 1 each by Does not apply route 2 (two) times daily before a meal. 100 each 8  . losartan-hydrochlorothiazide (HYZAAR) 50-12.5 MG tablet Take 1 tablet by mouth daily. Must have office visit for refills 90 tablet 0  . mupirocin ointment (BACTROBAN) 2 % Apply to affected area daily with dressing change. 30 g 0  . OLIVE LEAF EXTRACT PO Take 1 capsule by mouth daily.     No current facility-administered medications on file prior to visit.    Allergies:   Allergies  Allergen Reactions  . Lisinopril Anaphylaxis  . Shellfish Allergy Hives and Swelling     Physical Exam  There were no vitals filed for this visit. There is no height or weight on file to calculate BMI.  General: Obese pleasant middle-aged African-American male, seated, in no evident distress Head: head normocephalic and atraumatic.   Neck: supple with no carotid or  supraclavicular bruits Cardiovascular: regular rate and rhythm, no murmurs; bilateral lower extremity lymphedema Musculoskeletal: no deformity Skin:  no rash/petichiae Vascular:  Normal pulses all extremities   Neurologic Exam Mental Status: Awake and  fully alert. Oriented to place and time. Recent and remote memory intact. Attention span, concentration and fund of knowledge appropriate. Mood and affect appropriate.  Cranial Nerves: Fundoscopic exam reveals sharp disc margins. Pupils equal, briskly reactive to light. Extraocular movements show an gaze nystagmus with left gaze. Visual fields superior homonymous quadrantanopia. Hearing intact. Facial sensation intact. Face, tongue, palate moves normally and symmetrically.  Motor: Normal bulk and tone. Normal strength in all tested extremity muscles. Sensory.: intact to touch , pinprick , position and vibratory sensation.  Coordination: Rapid alternating movements normal in all extremities. Finger-to-nose performed accurately bilaterally and difficulty performing heel-to-shin due to lymphedema. Gait and Station: Arises from chair without difficulty. Stance is normal. Gait demonstrates  broad-based gait with greater difficulty with turns and use of cane Reflexes: 1+ and symmetric. Toes downgoing.      Diagnostic Data (Labs, Imaging, Testing)  CT HEAD WO CONTRAST 10/19/2018 IMPRESSION: 1. Chronic intracranial artery dolichoectasia with progressed calcified atherosclerosis since 2012. A lacune or infarct of the left pons is new since 2012 but probably chronic. Still, consider Acute Small Vessel Ischemia in this clinical setting. 2. No intracranial hemorrhage or cortically based infarct identified.  VAS [place] TCD 10/20/2018 Summary: Low normal mean flow velocities in majority of identified vessels of anterior and posterior cerebral circulations. Globally elevated pulsatility indices suggest diffuse intracranial atherosclerosis likely  VAS US CAROTID DUPLEX BILATERAL 10/20/2018 Summary: Right Carotid: Velocities in the right ICA are consistent with a 1-39% stenosis. Left Carotid: Velocities in the left ICA are consistent with a 1-39% stenosis. Vertebrals:  Bilateral vertebral arteries  demonstrate antegrade flow. Subclavians: Normal flow hemodynamics were seen in bilateral subclavian              arteries.  ECHOCARDIOGRAM 10/20/2018 IMPRESSIONS  1. The left ventricle has normal systolic function, with an ejection fraction of 55-60%. The cavity size was normal. There is moderately increased left ventricular wall thickness. Left ventricular diastolic Doppler parameters are consistent with  impaired relaxation. Elevated left ventricular end-diastolic pressure The E/e' is >15. There is abnormal septal motion consistent with left bundle branch block. No evidence of left ventricular regional wall motion abnormalities.  2. The mitral valve is abnormal. Mild thickening of the mitral valve leaflet. There is moderate mitral annular calcification present.  3. The tricuspid valve is grossly normal.  4. The aortic valve is tricuspid. Mild sclerosis of the aortic valve. No stenosis of the aortic valve.    ASSESSMENT: [name] is a 59 y.o. year old male here with likely brainstem lacunar infarct secondary to small vessel disease too small to be seen on imaging on 10/19/2018. Vascular risk factors include HTN, HLD, DM, Fabry's disease, diastolic heart failure, and prior history of stroke on imaging.  Initially recovering well but approximately 1 month ago, started to experience worsening diplopia, worsening imbalance and vertigo.  He was evaluated by neurology with concern of new stroke    PLAN:  1. Worsening prior symptoms: Order previously placed for CT head which will be obtained tomorrow to rule out acute stroke.  Unable to obtain MRI due to pacemaker.  It was also recommended for patient to participate in therapy at neuro rehab but patient wishes to consider this further at home and will call hospice if interested in the  future 2. Likely brainstem stroke: Continue clopidogrel 75 mg daily  and atorvastatin 80 mg for secondary stroke prevention. Maintain strict control of  hypertension with blood pressure goal below 130/90, diabetes with hemoglobin A1c goal below 6.5% and cholesterol with LDL cholesterol (bad cholesterol) goal below 70 mg/dL.  I also advised the patient to eat a healthy diet with plenty of whole grains, cereals, fruits and vegetables, exercise regularly with at least 30 minutes of continuous activity daily and maintain ideal body weight. 3. HTN: Advised to continue current treatment regimen.  Recommended monitoring BP at home with continued follow-up with PCP for management 4. HLD: Advised to continue current treatment regimen along with continued follow-up with PCP for future prescribing and monitoring of lipid panel 5. DMII: Advised to continue to monitor glucose levels at home along with continued follow-up with PCP for management and monitoring 6. Fabry's disease: Continue to follow with Dr. Krista Blue for ongoing management and monitoring.  During visit, denies any symptoms of neuropathy and sensation intact, and lymphedema has been stable.    Follow-up in 3 months or call earlier if needed   Greater than 50% of time during this 25 minute visit was spent on counseling, explanation of diagnosis of likely brainstem stroke, discussion regarding recurrence of symptoms.  Reviewing risk factor management of HTN, HLD, DM and Fabry's disease, planning of further management along with potential future management, and discussion with patient answering all questions to patient satisfaction    Frann Rider, [facility]  [facility] [address] [place], [place] [address]  Phone [phone] Fax [phone] Note: This document was prepared with digital dictation and possible smart phrase technology. Any transcriptional errors that result from this process are unintentional.

## 2019-09-04 ENCOUNTER — Other Ambulatory Visit: Payer: Self-pay

## 2019-09-04 ENCOUNTER — Encounter (HOSPITAL_BASED_OUTPATIENT_CLINIC_OR_DEPARTMENT_OTHER): Payer: Medicare HMO | Attending: Internal Medicine | Admitting: Internal Medicine

## 2019-09-04 DIAGNOSIS — I5030 Unspecified diastolic (congestive) heart failure: Secondary | ICD-10-CM | POA: Insufficient documentation

## 2019-09-04 DIAGNOSIS — Z823 Family history of stroke: Secondary | ICD-10-CM | POA: Insufficient documentation

## 2019-09-04 DIAGNOSIS — Z8673 Personal history of transient ischemic attack (TIA), and cerebral infarction without residual deficits: Secondary | ICD-10-CM | POA: Diagnosis not present

## 2019-09-04 DIAGNOSIS — Z833 Family history of diabetes mellitus: Secondary | ICD-10-CM | POA: Insufficient documentation

## 2019-09-04 DIAGNOSIS — R609 Edema, unspecified: Secondary | ICD-10-CM | POA: Insufficient documentation

## 2019-09-04 DIAGNOSIS — Z841 Family history of disorders of kidney and ureter: Secondary | ICD-10-CM | POA: Insufficient documentation

## 2019-09-04 DIAGNOSIS — E7521 Fabry (-Anderson) disease: Secondary | ICD-10-CM | POA: Insufficient documentation

## 2019-09-04 DIAGNOSIS — E1122 Type 2 diabetes mellitus with diabetic chronic kidney disease: Secondary | ICD-10-CM | POA: Diagnosis not present

## 2019-09-04 DIAGNOSIS — L97512 Non-pressure chronic ulcer of other part of right foot with fat layer exposed: Secondary | ICD-10-CM | POA: Insufficient documentation

## 2019-09-04 DIAGNOSIS — Z888 Allergy status to other drugs, medicaments and biological substances status: Secondary | ICD-10-CM | POA: Insufficient documentation

## 2019-09-04 DIAGNOSIS — L97522 Non-pressure chronic ulcer of other part of left foot with fat layer exposed: Secondary | ICD-10-CM | POA: Insufficient documentation

## 2019-09-04 DIAGNOSIS — I890 Lymphedema, not elsewhere classified: Secondary | ICD-10-CM | POA: Insufficient documentation

## 2019-09-04 DIAGNOSIS — Z950 Presence of cardiac pacemaker: Secondary | ICD-10-CM | POA: Diagnosis not present

## 2019-09-04 DIAGNOSIS — I132 Hypertensive heart and chronic kidney disease with heart failure and with stage 5 chronic kidney disease, or end stage renal disease: Secondary | ICD-10-CM | POA: Insufficient documentation

## 2019-09-04 DIAGNOSIS — N183 Chronic kidney disease, stage 3 unspecified: Secondary | ICD-10-CM | POA: Insufficient documentation

## 2019-09-04 DIAGNOSIS — E669 Obesity, unspecified: Secondary | ICD-10-CM | POA: Insufficient documentation

## 2019-09-04 DIAGNOSIS — E11621 Type 2 diabetes mellitus with foot ulcer: Secondary | ICD-10-CM | POA: Diagnosis not present

## 2019-09-04 DIAGNOSIS — Z6841 Body Mass Index (BMI) 40.0 and over, adult: Secondary | ICD-10-CM | POA: Insufficient documentation

## 2019-09-04 DIAGNOSIS — M109 Gout, unspecified: Secondary | ICD-10-CM | POA: Diagnosis not present

## 2019-09-05 ENCOUNTER — Encounter: Payer: Self-pay | Admitting: Physical Therapy

## 2019-09-05 ENCOUNTER — Ambulatory Visit: Payer: Medicare HMO | Admitting: Adult Health

## 2019-09-05 ENCOUNTER — Ambulatory Visit: Payer: Medicare HMO | Admitting: Physical Therapy

## 2019-09-05 ENCOUNTER — Encounter: Payer: Self-pay | Admitting: Adult Health

## 2019-09-05 VITALS — BP 124/72 | HR 74 | Temp 98.2°F | Ht 70.0 in | Wt 284.0 lb

## 2019-09-05 DIAGNOSIS — I10 Essential (primary) hypertension: Secondary | ICD-10-CM | POA: Diagnosis not present

## 2019-09-05 DIAGNOSIS — E669 Obesity, unspecified: Secondary | ICD-10-CM

## 2019-09-05 DIAGNOSIS — M6281 Muscle weakness (generalized): Secondary | ICD-10-CM

## 2019-09-05 DIAGNOSIS — G463 Brain stem stroke syndrome: Secondary | ICD-10-CM | POA: Diagnosis not present

## 2019-09-05 DIAGNOSIS — Z8673 Personal history of transient ischemic attack (TIA), and cerebral infarction without residual deficits: Secondary | ICD-10-CM

## 2019-09-05 DIAGNOSIS — E1169 Type 2 diabetes mellitus with other specified complication: Secondary | ICD-10-CM | POA: Diagnosis not present

## 2019-09-05 DIAGNOSIS — E785 Hyperlipidemia, unspecified: Secondary | ICD-10-CM | POA: Diagnosis not present

## 2019-09-05 DIAGNOSIS — R2681 Unsteadiness on feet: Secondary | ICD-10-CM | POA: Diagnosis not present

## 2019-09-05 DIAGNOSIS — E7521 Fabry (-Anderson) disease: Secondary | ICD-10-CM

## 2019-09-05 DIAGNOSIS — R2689 Other abnormalities of gait and mobility: Secondary | ICD-10-CM | POA: Diagnosis not present

## 2019-09-05 NOTE — Patient Instructions (Signed)
Continue to participate in outpatient therapy for ongoing improvement  Continue clopidogrel 75 mg daily  and atorvastatin for secondary stroke prevention  Continue to follow up with PCP regarding cholesterol, blood pressure and diabetes management   Continue to monitor blood pressure at home  Maintain strict control of hypertension with blood pressure goal below 130/90, diabetes with hemoglobin A1c goal below 6.5% and cholesterol with LDL cholesterol (bad cholesterol) goal below 70 mg/dL. I also advised the patient to eat a healthy diet with plenty of whole grains, cereals, fruits and vegetables, exercise regularly and maintain ideal body weight.  Followup in the future with me in 6 months or call earlier if needed       Thank you for coming to see [place] at [facility]. I hope we have been able to provide you high quality care today.  You may receive a patient satisfaction survey over the next few weeks. We would appreciate your feedback and comments so that we may continue to improve ourselves and the health of our patients.

## 2019-09-05 NOTE — Patient Instructions (Addendum)
Access Code: [email or web address]: [email or web address]: 03/31/2021Prepared by: Langley Gauss RobertsonExercises  Standing Hip Abduction with Counter Support - 1 x daily - 5 x weekly - 10 reps - 2 sets  Standing Hip Extension with Counter Support - 1 x daily - 5 x weekly - 10 reps - 2 sets  Single Leg Stance with Support - 1 x daily - 5 x weekly - 3 reps - 1 sets - 10 sec hold  Standing Tandem Balance with Counter Support - 1 x daily - 5 x weekly - 3 reps - 1 sets - 10 sec hold  Standing Balance in Corner - 1 x daily - 5 x weekly - 10 reps - 2 sets  Standing Near Stance in Corner - 1 x daily - 5 x weekly - 10 reps - 2 sets  Lateral Step Up with Counter Support - 1 x daily - 5 x weekly - 1 sets - 10 reps  Alternating Step Taps with Counter Support - 1 x daily - 5 x weekly - 2 sets - 10 reps

## 2019-09-05 NOTE — Progress Notes (Signed)
I have reviewed and agreed above plan. 

## 2019-09-05 NOTE — Progress Notes (Signed)
[name], [name] ([id number]) Visit Report for 09/04/2019 Abuse/Suicide Risk Screen Details Patient Name: Date of Service: [name], [name] 09/04/2019 1:15 PM Medical Record [id number] Patient Account Number: [id number] Date of Birth/Sex: Treating RN: [birth year] (59 y.o. Ernestene Mention Primary Care Camiya Vinal: Karle Plumber Other Clinician: Referring Almando Brawley: Treating Jeramyah Goodpasture/Extender:Robson, [place] Scrape, Pecola Leisure in Treatment: 0 Abuse/Suicide Risk Screen Items Answer ABUSE RISK SCREEN: Has anyone close to you tried to hurt or harm you recentlyo No Do you feel uncomfortable with anyone in your familyo No Has anyone forced you do things that you didnt want to doo No Electronic Signature(s) Signed: 09/05/2019 6:50:57 PM By: Baruch Gouty RN, BSN Entered By: Baruch Gouty on 09/04/2019 13:44:02 -------------------------------------------------------------------------------- Activities of Daily Living Details Patient Name: Date of Service: TED, GOODNER 09/04/2019 1:15 PM Medical Record [id number] Patient Account Number: [id number] Date of Birth/Sex: Treating RN: [birth year] (59 y.o. Ernestene Mention Primary Care Lakedra Washington: Karle Plumber Other Clinician: Referring Alieah Brinton: Treating Shonda Mandarino/Extender:Robson, [place] Scrape, Pecola Leisure in Treatment: 0 Activities of Daily Living Items Answer Activities of Daily Living (Please select one for each item) Drive Automobile Completely Able Take Medications Completely Able Use Telephone Completely Able Care for Appearance Completely Able Use Toilet Completely Able Bath / Shower Completely Able Dress Self Completely Able Feed Self Completely Able Walk Need Assistance Get In / Out Bed Completely Able Housework Completely Able Prepare Meals Completely [place] for Self Completely Able Electronic Signature(s) Signed: 09/05/2019 6:50:57 PM By: Baruch Gouty  RN, BSN Entered By: Baruch Gouty on 09/04/2019 13:44:26 -------------------------------------------------------------------------------- Education Screening Details Patient Name: Date of Service: [name], [name] 09/04/2019 1:15 PM Medical Record [id number] Patient Account Number: [id number] Date of Birth/Sex: Treating RN: Nov 01, 1960 (59 y.o. Ernestene Mention Primary Care Quinnley Colasurdo: Karle Plumber Other Clinician: Referring Kenyona Rena: Treating Zennie Ayars/Extender:Robson, [place] Scrape, Pecola Leisure in Treatment: 0 Primary Learner Assessed: Patient Learning Preferences/Education Level/Primary Language Learning Preference: Explanation, Demonstration, Printed Material Highest Education Level: High School Preferred Language: English Cognitive Barrier Language Barrier: No Translator Needed: No Memory Deficit: No Emotional Barrier: No Cultural/Religious Beliefs Affecting Medical Care: No Physical Barrier Impaired Vision: No Impaired Hearing: No Decreased Hand dexterity: No Knowledge/Comprehension Knowledge Level: High Comprehension Level: High Ability to understand written High instructions: Ability to understand verbal High instructions: Motivation Anxiety Level: Calm Cooperation: Cooperative Education Importance: Acknowledges Need Interest in Health Problems: Asks Questions Perception: Coherent Willingness to Engage in Self- High Management Activities: Readiness to Engage in Self- High Management Activities: Electronic Signature(s) Signed: 09/05/2019 6:50:57 PM By: Baruch Gouty RN, BSN Entered By: Baruch Gouty on 09/04/2019 13:45:09 -------------------------------------------------------------------------------- Fall Risk Assessment Details Patient Name: Date of Service: [name] Sells D. 09/04/2019 1:15 PM Medical Record [id number] Patient Account Number: [id number] Date of Birth/Sex: Treating RN: 02-22-1961 (59 y.o. Ernestene Mention Primary Care Asberry Lascola: Karle Plumber Other Clinician: Referring Zakari Couchman: Treating Lorice Lafave/Extender:Robson, [place] Scrape, Pecola Leisure in Treatment: 0 Fall Risk Assessment Items Have you had 2 or more falls in the last 12 monthso 0 No Have you had any fall that resulted in injury in the last 12 monthso 0 No FALLS RISK SCREEN History of falling - immediate or within 3 months 25 Yes Secondary diagnosis (Do you have 2 or more medical diagnoseso) 0 No Ambulatory aid None/bed rest/wheelchair/nurse 0 No Crutches/cane/walker 15 Yes Furniture 0 No Intravenous therapy Access/Saline/Heparin Lock 0 No Weak (short steps with or without shuffle, stooped but able to lift head 0 No while walking, may seek support from furniture) Impaired (short  steps with shuffle, may have difficulty arising from chair, 0 No head down, impaired balance) Mental Status Oriented to own ability 0 Yes Overestimates or forgets limitations 0 No Risk Level: Medium Risk Score: 40 Electronic Signature(s) Signed: 09/05/2019 6:50:57 PM By: Baruch Gouty RN, BSN Entered By: Baruch Gouty on 09/04/2019 13:45:43 -------------------------------------------------------------------------------- Foot Assessment Details Patient Name: Date of Service: [name] Sells D. 09/04/2019 1:15 PM Medical Record [id number] Patient Account Number: [id number] Date of Birth/Sex: Treating RN: 1960/08/14 (59 y.o. Ernestene Mention Primary Care Yvette Roark: Karle Plumber Other Clinician: Referring Isamar Nazir: Treating Hailley Byers/Extender:Robson, Dayton Scrape, Pecola Leisure in Treatment: 0 Foot Assessment Items Site Locations + = Sensation present, - = Sensation absent, C = Callus, U = Ulcer R = Redness, W = Warmth, M = Maceration, PU = Pre-ulcerative lesion F = Fissure, S = Swelling, D = Dryness Assessment Right: Left: Other Deformity: No No Prior Foot Ulcer: No No Prior Amputation: No No Charcot Joint: No  No Ambulatory Status: Ambulatory With Help Assistance Device: Cane Gait: Steady Electronic Signature(s) Signed: 09/05/2019 6:50:57 PM By: Baruch Gouty RN, BSN Entered By: Baruch Gouty on 09/04/2019 13:48:58 -------------------------------------------------------------------------------- Nutrition Risk Screening Details Patient Name: Date of Service: DMITRI, PETTIGREW 09/04/2019 1:15 PM Medical Record [id number] Patient Account Number: [id number] Date of Birth/Sex: Treating RN: 1961-01-26 (59 y.o. Ernestene Mention Primary Care Eufemia Prindle: Karle Plumber Other Clinician: Referring Maygen Sirico: Treating Mont Jagoda/Extender:Robson, Dayton Scrape, Pecola Leisure in Treatment: 0 Height (in): 70 Weight (lbs): 284 Body Mass Index (BMI): 40.7 Nutrition Risk Screening Items Score Screening NUTRITION RISK SCREEN: I have an illness or condition that made me change the kind and/or 0 No amount of food I eat I eat fewer than two meals per day 0 No I eat few fruits and vegetables, or milk products 0 No I have three or more drinks of beer, liquor or wine almost every day 0 No I have tooth or mouth problems that make it hard for me to eat 0 No I don't always have enough money to buy the food I need 0 No I eat alone most of the time 1 Yes I take three or more different prescribed or over-the-counter drugs a day 1 Yes 0 No Without wanting to, I have lost or gained 10 pounds in the last six months I am not always physically able to shop, cook and/or feed myself 0 No Nutrition Protocols Good Risk Protocol 0 No interventions needed Moderate Risk Protocol High Risk Proctocol Risk Level: Good Risk Score: 2 Electronic Signature(s) Signed: 09/05/2019 6:50:57 PM By: Baruch Gouty RN, BSN Entered By: Baruch Gouty on 09/04/2019 13:46:52

## 2019-09-05 NOTE — Therapy (Signed)
[facility] [address] [place], [place], [address] Phone: [phone]   Fax:  [phone]  Physical Therapy Treatment  Patient Details  Name: [name] MRN: [id number] Date of Birth: 1961-02-04 Referring Provider (PT): Frann Rider, NP   Encounter Date: 09/05/2019  PT End of Session - 09/05/19 1732    Visit Number  9    Number of Visits  18    Date for PT Re-Evaluation  10/21/19    Authorization Type  [employer] Medicare-will need 10th visit progress note    PT Start Time  0932    PT Stop Time  1015    PT Time Calculation (min)  43 min    Equipment Utilized During Treatment  Gait belt    Activity Tolerance  Patient tolerated treatment well    Behavior During Therapy  [facility] for tasks assessed/performed       Past Medical History:  Diagnosis Date  . Bell's palsy   . Bifascicular block   . Chronic bronchitis ([place])    "seasonal; get it q yr"  . Chronic diastolic CHF (congestive heart failure) ([place]) 2010  . Chronic kidney disease (CKD), stage II (mild)    stage II to III/notes 07/23/2014  . Diastolic heart failure secondary to hypertrophic cardiomyopathy ([place]) CARDIOLOGIST-- DR Daneen Schick  . Dyspnea    increased exertion   . Edema 07/2013  . 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 Gout, arthritis 2014   bil feet. right worse  . History of cellulitis of skin with lymphangitis LEFT LEG  . HOCM (hypertrophic obstructive cardiomyopathy) ([place])    a. Echo 11/16: Severe LVH, EF 55-60%, abnormal GLS consistent with HOCM, no SAM, mild LAE  . Hypertension 20 years  . Lymphedema of lower extremity LEFT  >  RIGHT   "using ankle high socks at home; pump doesn't work for me" (07/23/2014)  . Pacemaker    02/12/11  . Pneumonia 08/2011  . Seasonal asthma NO INHALERS   30 years  . Second degree Mobitz II AV block    with syncope, s/p PPM  . Short of breath on exertion   . Type II  diabetes mellitus ([place]) 2012   INSULIN DEPENDENT    Past Surgical History:  Procedure Laterality Date  . CARDIAC CATHETERIZATION  03-12-2011  DR Daneen Schick   HYPERTROPHIC CARDIOMYOPATHY WITH LV CAVITY  APPEARANCE CONSISTANT WITH SIGNIFICANT APICAL HYPERTROPHY/ NORMAL LVSF / EF 55%/ EVIDENCE OF DIASTOLIC DYSFUNCTION WITH EDP OF 23-58mHg AFTER A-WAVE/ NORMAL CORONARY ARTERIES  . EVALUATION UNDER ANESTHESIA WITH FISTULECTOMY N/A 06/23/2015   Procedure: EXAM UNDER ANESTHESIA WITH POSSIBLE FISTULOTOMY;  Surgeon: JJudeth Horn MD;  [facility]  Service: General;  Laterality: N/A;  . HERNIA REPAIR  [id number]  Umbilical Hernia Repair  . INCISION AND DRAINAGE PERIRECTAL ABSCESS Left 07/29/2014   Procedure: IRRIGATION AND DEBRIDEMENT PERIRECTAL ABSCESS;  Surgeon: JDoreen Salvage MD;  Location: [facility]  Service: General;  Laterality: Left;  Prone position  . INSERT / REPLACE / REMOVE PACEMAKER  02/12/2011   SJM implanted by Dr ARayann Hemanfor Mobitz II second degree AV block and syncope  . IRRIGATION AND DEBRIDEMENT ABSCESS N/A 07/27/2013   Procedure: IRRIGATION AND DEBRIDEMENT OF SKIN, SOFT TISSUE AND MUSCLES OF UPPER BACK (11X19X4cm) WITH 10 BLADE AND PULSATILE LAVAGE ;  Surgeon: EGayland Curry MD;  [facility]  Service: General;  Laterality: N/A;  . PPM GENERATOR CHANGEOUT N/A  05/22/2019   Procedure: PPM GENERATOR CHANGEOUT;  Surgeon: Thompson Grayer, MD;  Location: [facility];  Service: Cardiovascular;  Laterality: N/A;  . UMBILICAL HERNIA REPAIR  03/22/2012   Procedure: HERNIA REPAIR UMBILICAL ADULT;  Surgeon: Leighton Ruff, MD;  Location: [facility];  Service: General;  Laterality: N/A;  Umbilical Hernia Repair     There were no vitals filed for this visit.  Subjective Assessment - 09/05/19 0935    Subjective  Pt reports going to get his legs wrapped yesterday for Lymphedema at the wound center.  Going back Tuesday to have them rechecked.  Feels like he has "lost some stamina" since he was here  several weeks ago.    Patient Stated Goals  To work on this off-balance-ness.    Currently in Pain?  No/denies                       [facility] Adult PT Treatment/Exercise - 09/05/19 0001      Transfers   Transfers  Sit to Stand;Stand to Sit    Sit to Stand  5: Supervision;Without upper extremity assist;From chair/3-in-1    Sit to Stand Details (indicate cue type and reason)  required multiple attemtps at times    Stand to Sit  5: Supervision      Ambulation/Gait   Ambulation/Gait  Yes    Ambulation/Gait Assistance  5: Supervision    Ambulation Distance (Feet)  --   in/out of clinic   Assistive device  Straight cane      Exercises   Exercises  Other Exercises    Other Exercises   Treatment session focused on reviewing and adding to HEP.  See HEP for details.             PT Education - 09/05/19 1731    Education Details  HEP, discussing possibility of nutrition consult with MD as pt reporting that his blood sugars have been running high despite MD changing meds    Person(s) Educated  Patient    Methods  Explanation;Demonstration;Verbal cues;Handout    Comprehension  Verbalized understanding;Returned demonstration       PT Short Term Goals - 09/05/19 1732      PT SHORT TERM GOAL #1   Title  Pt will be independent with HEP for improved balance, strength, gait for decreased fall risk.  TARGET for all STGS: 08/24/2019    Time  5    Period  Weeks    Status  Achieved      PT SHORT TERM GOAL #2   Title  Pt will improve 5x sit<>stand to less than or equal to 16 seconds for improved functional lower extremity strength.    Baseline  16.59 sec at best 08/16/2019    Time  5    Period  Weeks    Status  Partially Met      PT SHORT TERM GOAL #3   Title  Pt will improve TUG score to less than or equal to 18 seconds for decreased fall risk.    Baseline  TUG 14 sec 08/16/2019    Time  5    Period  Weeks    Status  Achieved      PT SHORT TERM GOAL #4   Title  Pt  will improve DGI score to at least 16/24 for decreased fall risk.    Baseline  15/24 08/16/2019    Time  5    Period  Weeks    Status  Partially Met      PT SHORT TERM GOAL #5   Title  Pt will verbalize understanding of fall prevention in home environment.    Time  5    Period  Weeks    Status  Achieved        PT Long Term Goals - 07/23/19 1227      PT LONG TERM GOAL #1   Title  Pt will be independent with progression of HEP to address strength, balance, and gait for decreased fall risk.  TARGET 09/21/2019    Time  9    Period  Weeks    Status  New      PT LONG TERM GOAL #2   Title  Pt will improve 5x sit<>stand to less than or equal to 12.5 seconds for improved functional strength.    Time  9    Period  Weeks    Status  New      PT LONG TERM GOAL #3   Title  Pt will improve TUG score to less than or equal to 15 seconds for decreased fall risk.    Time  9    Period  Weeks    Status  New      PT LONG TERM GOAL #4   Title  Pt will improve DGI to at least 19/24 for decreased fall risk.    Time  9    Period  Weeks    Status  New      PT LONG TERM GOAL #5   Title  Pt will improve gait velocity to at least 2.3 ft/sec for improved gait efficiency and safety in community.    Time  9    Period  Weeks    Status  New            Plan - 09/05/19 1733    Clinical Impression Statement  Skilled session focused on reviewing and revising HEP.  Pt met STG 1 as he is independent with current HEP.  Continue PT per POC.    Personal Factors and Comorbidities  Comorbidity 3+    Comorbidities  DM, pacemaker, lymphedema in BLEs, FAbry's disease, diastolic heart failure, chronic kidney disease, heart failure, Bell's palsy.    Examination-Activity Limitations  Locomotion Level;Transfers    Examination-Participation Restrictions  Community Activity;Shop;Volunteer    Stability/Clinical Decision Making  Evolving/Moderate complexity    Rehab Potential  Good    PT Frequency  Other  (comment)   1x/wk for 1 week, then 2x/wk for 8 weeks   PT Duration  Other (comment)   total POC = 9 weeks   PT Treatment/Interventions  ADLs/Self Care Home Management;Gait training;Stair training;Functional mobility training;Therapeutic activities;Therapeutic exercise;Balance training;Neuromuscular re-education;Patient/family education    PT Next Visit Plan  Will need 10th visit PN next treatment.  Continue SLS and hip strengthening, dynamic balance on compliant surfaces, review turns    Consulted and Agree with Plan of Care  Patient       Patient will benefit from skilled therapeutic intervention in order to improve the following deficits and impairments:  Abnormal gait, Difficulty walking, Decreased balance, Decreased mobility, Decreased strength  Visit Diagnosis: Unsteadiness on feet  Muscle weakness (generalized)  Other abnormalities of gait and mobility     Problem List Patient Active Problem List   Diagnosis Date Noted  . Morbid obesity ([place]) 08/23/2019  . 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 CVA (cerebral vascular accident) ([place]) 10/20/2018  . UTI (urinary tract infection) 10/19/2018  .  Upper airway cough syndrome 08/01/2018  . Lymphedema 10/06/2016  . Gout, arthritis   . 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 Onychomycosis of toenail 09/26/2014  . Diabetes mellitus type 2 in obese ([place])   . Iron deficiency anemia 07/29/2014  . CKD (chronic kidney disease), stage III ([place])   . Chronic diastolic HF (heart failure) ([place]) 03/27/2013  . Peripheral edema 07/20/2012  . Obstructive sleep apnea, suspected 06/21/2012  . Pacemaker-St.Jude 02/23/2012  . Hypertension 08/09/2011  . COPD with asthma ([place]) 08/09/2011  . Mobitz type II atrioventricular block 05/19/2011  . 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[place]) 05/19/2011    Narda Bonds, PTA [facility] 09/05/19 5:35 PM Phone: [phone] Fax: [facility] [address] [place], [place], [address] Phone: [phone]   Fax:  [phone]  Name: [name] MRN: [id number] Date of Birth: 05-Feb-1961

## 2019-09-05 NOTE — Progress Notes (Signed)
[facility] [address] [place]. [place] [address] [phone]       OFFICE FOLLOW UP NOTE  [name] Date of Birth:  22-Aug-1960 Medical Record Number:  [id number]   Reason for visit: Stroke follow-up   CHIEF COMPLAINT:  Chief Complaint  Patient presents with  . Follow-up    treatment rm, alone, hx of stroke     HPI:  [name] is a 59 year old male who is being seen today, 09/03/2019, for stroke follow-up.  During prior visit, concern of decreased vision, worsening imbalance and vertigo therefore CT head obtained which did not show any acute abnormalities.  Unable to obtain MRI due to pacemaker.  He continues to work with PT with ongoing improvement of balance.  He denies residual dizziness or vertigo symptoms or worsening visual impairment.  Continues to ambulate with a cane without any recent falls.  Continues on Plavix and atorvastatin for secondary stroke prevention without side effects.  Blood pressure today 124/72.  Glucose levels stable.  Continues to follow with PCP regularly for chronic disease management.  Continues to follow with Dr. Krista Blue in this office for history of Fabry's disease.  Currently being followed by wound clinic for bilateral lower extremity wounds with increased lymphedema.  No further concerns at this time.     History: Provided for reference purposes only Stroke admission 10/19/2018: [name] is a 59 y.o. male with history of diabetes, second-degree Mobitz with AV block requiring a pacemaker which is not MRI compatible, lymphedema in the lower extremities, HOCM, Fabry'sdisease, diastolic heart failure, chronic kidney disease, heart failure, and Bell's palsy  who presented on 10/19/2018 with headache, left facial paresthesias and pain behind his eyes.  Stroke work-up likely brainstem lacunar infarct secondary to small vessel disease too small to be seen on imaging could be related to Fabry's disease.  CT head did  show old left pontine lacune which was new finding since prior imaging in 2012.  TCD showed diffuse intracranial atherosclerosis.  Carotid Doppler and 2D echo unremarkable.  HIV negative.  UDS negative.  LDL 161 and A1c 7.4.  Recommended DAPT for 3 weeks and Plavix alone.  He continues to follow with Dr. Krista [facility] at [facility] for ongoing management of Fabry's disease and recommended holding Fabrazyme in setting of acute illness and to resume 2 weeks prior nephrology input.  HTN stable.  Increase atorvastatin dose from 20 mg to 80 mg for HLD management.  Ongoing follow-up with PCP for DM management.  Other stroke risk factors include EtOH use, morbid obesity, family history of stroke, chronic diastolic CHF and hypertrophic obstructive cardiomyopathy with EF of 55 to 60%.  He was also found to have staph UTI and was treated with ceftriaxone.  Residual deficit of ataxia with diplopia and balance deficits and therefore discharged to CIR for ongoing therapy.  He was discharged home on 11/02/2018.   Virtual visit 12/04/2018 JM: Residual deficits of mild gait difficulties, left cheek numbness sensation and blurred vision upon awakening but improved through out the day - therapies completed after 2 visits - he also felt as though this was not beneficial for him as he was having them do exercises he learned during admission which he continued to do on his own at home He lives independently where he has returned back to all prior ADLs - he has been retired since 2013; he has not returned to driving at this time due to diplopia - he has not followed with ophthalmology  at this time Completed 3 weeks DAPT and continues on Plavix alone without side effects of bleeding or bruising Continues on atorvastatin 80 mg daily without side effects myalgias Blood pressure - plans on obtaining his own cuff to monitor at home  He has resumed Fabrazyme on 11/17/2018 No further concerns at this time  Update 03/07/2019 JM: [name] is being seen  today for stroke follow-up.  He has been doing well form a stroke standpoint with only intermittent diplopia which typically worsens with bright light.  Continues to use cane for occasional fatigue/"winded" with prolonged ambulation but this has been stable without worsening. He continues to do HEP at home routinely. Continues on Plavix and atorvastatin for secondary stroke prevention without side effects.  Glucose levels have been stable.  Blood pressure today satisfactory at 122/83.  Denies new or worsening stroke/TIA symptoms. He continues to receive Fabrazyme infusions for Fabry disease and follows with Dr. Krista Blue routinely.   Update 06/04/2019 JM: [name] is a 59 year old who is being seen today for stroke follow up as well as request by opthomology due to worsening diplopia.  Patient states he had acute onset of worsening double vision, imbalance and vertigo on 04/06/2019.  He was previously recovering well slowly returning back to prior activities.  He was evaluated with ophthalmology and concern for possible new stroke.  He unfortunately did have a fall on Christmas Eve when he went to stand up from a chair but went straight forward falling onto his face.  He reports he had a sensation as though he was being pushed down or pulled towards his left side.  He denies any injury, headache or worsening/new neurological symptoms.  He was advised by his PCP to be evaluated in the ER immediately but this had not been done for unknown reasons.  He does report improvement of vision since that time but has concerns regarding continued imbalance and vertigo.  He continues to follow with Dr. Krista Blue in his office for history of Fabrys disease receiving Fabrazyme infusions twice weekly.  He plans on undergoing CT head tomorrow to rule out possible new or worsening stroke.  He denies any increased stress, infections, illness, medication changes or trauma that occurred prior to worsening of prior symptoms.  Blood pressure  today 122/74.  He has continued on Plavix and atorvastatin for secondary stroke prevention without side effects.  Glucose levels have been slightly elevated over the past 2 weeks.  He continues to follow PCP for HTN, HLD and DM.  No further concerns at this time.         ROS:   14 system review of systems performed and negative with exception of imbalance, lymphedema   PMH:  Past Medical History:  Diagnosis Date  . Bell's palsy   . Bifascicular block   . Chronic bronchitis ([place])    "seasonal; get it q yr"  . Chronic diastolic CHF (congestive heart failure) ([place]) 2010  . Chronic kidney disease (CKD), stage II (mild)    stage II to III/notes 07/23/2014  . Diastolic heart failure secondary to hypertrophic cardiomyopathy ([place]) CARDIOLOGIST-- DR Daneen Schick  . Dyspnea    increased exertion   . Edema 07/2013  . Fabry's disease ([place]) RENAL AND CARDIAC INVOVLEMENT   FOLLOWED DR [place]  . Gout, arthritis 2014   bil feet. right worse  . History of cellulitis of skin with lymphangitis LEFT LEG  . HOCM (hypertrophic obstructive cardiomyopathy) ([place])    a. Echo 11/16: Severe  LVH, EF 55-60%, abnormal GLS consistent with HOCM, no SAM, mild LAE  . Hypertension 20 years  . Lymphedema of lower extremity LEFT  >  RIGHT   "using ankle high socks at home; pump doesn't work for me" (07/23/2014)  . Pacemaker    02/12/11  . Pneumonia 08/2011  . Seasonal asthma NO INHALERS   30 years  . Second degree Mobitz II AV block    with syncope, s/p PPM  . Short of breath on exertion   . Type II diabetes mellitus ([place]) 2012   INSULIN DEPENDENT    PSH:  Past Surgical History:  Procedure Laterality Date  . CARDIAC CATHETERIZATION  03-12-2011  DR Daneen Schick   HYPERTROPHIC CARDIOMYOPATHY WITH LV CAVITY  APPEARANCE CONSISTANT WITH SIGNIFICANT APICAL HYPERTROPHY/ NORMAL LVSF / EF 55%/ EVIDENCE OF DIASTOLIC DYSFUNCTION WITH EDP OF 23-45mmHg AFTER A-WAVE/ NORMAL CORONARY ARTERIES  . EVALUATION UNDER  ANESTHESIA WITH FISTULECTOMY N/A 06/23/2015   Procedure: EXAM UNDER ANESTHESIA WITH POSSIBLE FISTULOTOMY;  Surgeon: Judeth Horn, MD;  Location: [facility];  Service: General;  Laterality: N/A;  . HERNIA REPAIR  0630   Umbilical Hernia Repair  . INCISION AND DRAINAGE PERIRECTAL ABSCESS Left 07/29/2014   Procedure: IRRIGATION AND DEBRIDEMENT PERIRECTAL ABSCESS;  Surgeon: Doreen Salvage, MD;  Location: [facility];  Service: General;  Laterality: Left;  Prone position  . INSERT / REPLACE / REMOVE PACEMAKER  02/12/2011   SJM implanted by Dr Rayann Heman for Mobitz II second degree AV block and syncope  . IRRIGATION AND DEBRIDEMENT ABSCESS N/A 07/27/2013   Procedure: IRRIGATION AND DEBRIDEMENT OF SKIN, SOFT TISSUE AND MUSCLES OF UPPER BACK (11X19X4cm) WITH 10 BLADE AND PULSATILE LAVAGE ;  Surgeon: Gayland Curry, MD;  Location: [facility];  Service: General;  Laterality: N/A;  . PPM GENERATOR CHANGEOUT N/A 05/22/2019   Procedure: PPM GENERATOR CHANGEOUT;  Surgeon: Thompson Grayer, MD;  Location: [facility];  Service: Cardiovascular;  Laterality: N/A;  . UMBILICAL HERNIA REPAIR  03/22/2012   Procedure: HERNIA REPAIR UMBILICAL ADULT;  Surgeon: Leighton Ruff, MD;  Location: [facility];  Service: General;  Laterality: N/A;  Umbilical Hernia Repair     Social History:  Social History   Socioeconomic History  . Marital status: Divorced    Spouse name: Not on file  . Number of children: Not on file  . Years of education: Not on file  . Highest education level: Not on file  Occupational History  . Not on file  Tobacco Use  . Smoking status: Never Smoker  . Smokeless tobacco: Never Used  Substance and Sexual Activity  . Alcohol use: Yes    Comment: 07/23/2014 "might have a few drinks on holidays or at cookouts"  . Drug use: No  . Sexual activity: Yes  Other Topics Concern  . 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 Difficulty of  Paying Living Expenses:   Food Insecurity: No Food Insecurity  . Worried About Charity fundraiser in the Last Year: Never true  . Ran Out of Food in the Last Year: Never true  Transportation Needs: No Transportation Needs  . Lack of Transportation (Medical): No  . Lack of Transportation (Non-Medical): No  Physical Activity: Insufficiently Active  . Days of Exercise per Week: 2 days  . Minutes of Exercise per Session: 20 min  Stress: No Stress Concern Present  . Feeling  of Stress : Only a little  Social Connections: Slightly Isolated  . Frequency of Communication with Friends and Family: Twice a week  . Frequency of Social Gatherings with Friends and Family: Once a week  . Attends Religious Services: More than 4 times per year  . Active Member of Clubs or Organizations: Yes  . Attends Archivist Meetings: 1 to 4 times per year  . Marital Status: Divorced  Human resources officer Violence: Not At Risk  . Fear of Current or Ex-Partner: No  . Emotionally Abused: No  . Physically Abused: No  . Sexually Abused: No    Family History:  Family History  Problem Relation Age of Onset  . Stroke Mother   . Fabry's disease Mother   . Fabry's disease Brother   . Colon cancer Neg Hx     Medications:   Current Outpatient Medications on File Prior to Visit  Medication Sig Dispense Refill  . Accu-Chek FastClix Lancets MISC 1 each by Other route 3 (three) times daily. 102 each 12  . acetaminophen (TYLENOL) 325 MG tablet Take 1-2 tablets (325-650 mg total) by mouth every 4 (four) hours as needed for mild pain.    . Agalsidase beta (FABRAZYME IV) Inject 115 mg into the vein every 14 (fourteen) days.     [name] Kitchen albuterol (PROAIR HFA) 108 (90 Base) MCG/ACT inhaler Inhale 1 puff into the lungs every 6 (six) hours as needed for wheezing or shortness of breath. 8.5 Inhaler 1  . allopurinol (ZYLOPRIM) 100 MG tablet TAKE 3 TABLETS BY MOUTH DAILY 90 tablet 1  . amLODipine (NORVASC) 10 MG tablet Take 1  tablet (10 mg total) by mouth daily. 30 tablet 6  . atorvastatin (LIPITOR) 20 MG tablet Take 20 mg by mouth daily.    [name] Kitchen atorvastatin (LIPITOR) 80 MG tablet TAKE 1 TABLET (80 MG TOTAL) BY MOUTH DAILY AT 6 PM. 90 tablet 2  . clopidogrel (PLAVIX) 75 MG tablet TAKE 1 TABLET BY MOUTH EVERY DAY 90 tablet 2  . fexofenadine (ALLEGRA) 180 MG tablet Take 1 tablet (180 mg total) by mouth daily. 30 tablet 3  . fluticasone (FLONASE) 50 MCG/ACT nasal spray Place 1 spray into both nostrils daily. (Patient taking differently: Place 1 spray into both nostrils daily as needed for allergies. ) 16 g 1  . furosemide (LASIX) 20 MG tablet TAKE 1 TABLET BY MOUTH EVERY DAY 90 tablet 2  . glucose blood (ACCU-CHEK AVIVA PLUS) test strip 1 each by Other route 3 (three) times daily. Use as instructed 100 each 12  . Insulin Lispro Prot & Lispro (HUMALOG 75/25 MIX) (75-25) 100 UNIT/ML Kwikpen INJECT 20 UNITS INTO THE SKIN 2 TIMES DAILY (Patient taking differently: Inject 20 Units into the skin 2 (two) times daily. Morning & before supper.) 15 mL 6  . Insulin Pen Needle (PX SHORTLENGTH PEN NEEDLES) 31G X 8 MM MISC 1 each by Does not apply route 2 (two) times daily before a meal. 100 each 8  . losartan-hydrochlorothiazide (HYZAAR) 50-12.5 MG tablet Take 1 tablet by mouth daily. Must have office visit for refills 90 tablet 0  . mupirocin ointment (BACTROBAN) 2 % Apply to affected area daily with dressing change. 30 g 0  . OLIVE LEAF EXTRACT PO Take 1 capsule by mouth daily.     No current facility-administered medications on file prior to visit.    Allergies:   Allergies  Allergen Reactions  . Lisinopril Anaphylaxis  . Shellfish Allergy Hives and Swelling  Physical Exam  Today's Vitals   09/05/19 1038  BP: 124/72  Pulse: 74  Temp: 98.2 F (36.8 C)  Weight: 284 lb (128.8 kg)  Height: 5\' 10"  (1.778 m)   Body mass index is 40.75 kg/m.  General: Obese pleasant middle-aged African-American male, seated, in no  evident distress Head: head normocephalic and atraumatic.   Neck: supple with no carotid or supraclavicular bruits Cardiovascular: regular rate and rhythm, no murmurs; bilateral lower extremity lymphedema Musculoskeletal: no deformity Skin:  no rash/petichiae; BLE wrapped due to lower extremity wounds Vascular:  Normal pulses all extremities   Neurologic Exam Mental Status: Awake and fully alert.   Normal speech and language.  Oriented to place and time. Recent and remote memory intact. Attention span, concentration and fund of knowledge appropriate. Mood and affect appropriate.  Cranial Nerves: Pupils equal, briskly reactive to light. Extraocular movements 2-3 beat nystagmus bilaterally with left gaze. Visual fields mild superior homonymous quadrantanopia. Hearing intact. Facial sensation intact. Face, tongue, palate moves normally and symmetrically.  Motor: Normal bulk and tone. Normal strength in all tested extremity muscles. Sensory.: intact to touch , pinprick , position and vibratory sensation.  Coordination: Rapid alternating movements normal in all extremities. Finger-to-nose performed accurately bilaterally and difficulty performing heel-to-shin due to lymphedema. Gait and Station: Arises from chair without difficulty. Stance is normal. Gait demonstrates  broad-based gait with greater difficulty with turns and use of cane Reflexes: 1+ and symmetric. Toes downgoing.       ASSESSMENT: LANELL DUBIE is a 59 y.o. year old male here with likely brainstem lacunar infarct secondary to small vessel disease too small to be seen on imaging on 10/19/2018. Vascular risk factors include HTN, HLD, DM, Fabry's disease, diastolic heart failure, and prior history of stroke on imaging.  Continues to experience imbalance but states resolution of visual impairment, dizziness and vertigo    PLAN:  1. Imbalance/gait impairment: Recommend continued participation in outpatient PT for ongoing  improvement.  Also recommend further discussion with provider regarding ongoing lymphedema which may be contributing to gait impairment and difficulty.  Advised use of cane at all times for fall prevention 2. Likely brainstem stroke: Continue clopidogrel 75 mg daily  and atorvastatin 80 mg for secondary stroke prevention. Maintain strict control of hypertension with blood pressure goal below 130/90, diabetes with hemoglobin A1c goal below 6.5% and cholesterol with LDL cholesterol (bad cholesterol) goal below 70 mg/dL.  I also advised the patient to eat a healthy diet with plenty of whole grains, cereals, fruits and vegetables, exercise regularly with at least 30 minutes of continuous activity daily and maintain ideal body weight. 3. HTN: Stable.  Ongoing monitoring management by PCP 4. HLD: Stable.  Ongoing monitoring management by PCP 5. DMII: Stable.  Ongoing monitoring management by PCP 6. Fabry's disease: Has not had recent follow-up with Dr. Krista Blue in regards to Fabry's disease and recommend scheduling follow-up visit for ongoing monitoring management along with recommendations regarding management of lymphedema   Follow-up in 6 months or call earlier if needed   I spent 23 minutes of face-to-face and non-face-to-face time with patient.  This included previsit chart review, lab review, study review, order entry, electronic health record documentation, patient education regarding balance difficulty, prior stroke, importance of secondary stroke risk factor management and ongoing pain monitoring management Fabry's disease    Frann Rider, [facility]  [facility] [address] [place], [place] [address]  Phone [phone] Fax [phone] Note: This document was prepared with  digital dictation and possible smart Company secretary. Any transcriptional errors that result from this process are unintentional.

## 2019-09-07 NOTE — Progress Notes (Signed)
I agree with the above plan 

## 2019-09-10 ENCOUNTER — Telehealth: Payer: Self-pay | Admitting: Internal Medicine

## 2019-09-10 ENCOUNTER — Ambulatory Visit: Payer: Medicare HMO | Attending: Adult Health | Admitting: Physical Therapy

## 2019-09-10 ENCOUNTER — Encounter: Payer: Self-pay | Admitting: Physical Therapy

## 2019-09-10 ENCOUNTER — Other Ambulatory Visit: Payer: Self-pay

## 2019-09-10 DIAGNOSIS — R2681 Unsteadiness on feet: Secondary | ICD-10-CM | POA: Insufficient documentation

## 2019-09-10 DIAGNOSIS — M6281 Muscle weakness (generalized): Secondary | ICD-10-CM | POA: Diagnosis not present

## 2019-09-10 DIAGNOSIS — R2689 Other abnormalities of gait and mobility: Secondary | ICD-10-CM | POA: Diagnosis not present

## 2019-09-10 NOTE — Progress Notes (Signed)
[place], SCHROADER ([id number]) Visit Report for 09/04/2019 Chief Complaint Document Details Patient Name: Date of Service: [name], [name] 09/04/2019 1:15 PM Medical Record [id number] Patient Account Number: [id number] Date of Birth/Sex: Treating RN: December 23, 1960 (58 y.o. [name]) [name] Primary Care Provider: Karle [name] Other Clinician: Referring Provider: Treating Provider/Extender:[name], [name], [name] in Treatment: 0 Information Obtained from: Patient Chief Complaint The patient is a 59 yrs old bm here for treatment of a buttock ulcer. He has diabetes, hypertension, heart disease, Fabry's disease, gout and chronic kidney disease. He noticed tenderness and pain in the buttock area and was seen in the ED. He was admitted and taken to the OR for debridement of an abscess. His blood sugars are doing much better. He is currently on the [employer]. He has been packing the area by himself at home 11/28/2018. Patient returns today for review of the edema in his legs. 09/04/19; this is a patient who returns for review of wounds on his bilateral dorsal feet Electronic Signature(s) Signed: 09/10/2019 7:44:34 AM By: [name] Ham MD Entered By: [name] on 09/05/2019 18:40:51 -------------------------------------------------------------------------------- HPI Details Patient Name: Date of Service: [name] Sells D. 09/04/2019 1:15 PM Medical Record [id number] Patient Account Number: [id number] Date of Birth/Sex: Treating RN: 21-Apr-1961 (58 y.o. [name] Primary Care Provider: Karle [name] Other Clinician: Referring Provider: Treating Provider/Extender:[name], [name], [name] in Treatment: 0 History of Present Illness Location: Patient presents with a wound to the left buttock area. Quality: Patient reports experiencing a dull pain to affected area(s). Severity: Mildly severe wound with no evidence of infection Duration:  Patient has had the wound for   2 weeks prior to presenting for treatment Timing: Pain in wound is Intermittent (comes and goes) HPI Description: ADMISSION 11/28/2018 This is a pleasant 59 year old man who has chronic lymphedema since about 2010. The exact etiology of this is not really clear indeed if this is been worked up it is not really clear. In any case he also has type 2 diabetes. He was in hospital recently for a small brainstem CVA discharged in late May with bilateral Unna wraps on his legs. Is not clear that he had wounds in the hospital. Nevertheless he comes in today with the same Unna boots on his legs. He has compression stockings at home but they are old and he wanted to get remeasured. He went to [facility] however they said that they would need up-to-date measurements which I believe we can provide today. He also has compression pumps which he uses intermittently at home. I am not really sure who ordered these. He does not have a wound history but states that when he went in the hospital his legs were so big that his stockings did not fit at home Past medical history includes type 2 diabetes, lymphedema, bursitis in the left knee on 10/23/2018, hyper lipidemia, recent brainstem CVA leaving him with some neurologic disability but much better, Febres disease, pacemaker and hypertension. The patient had venous reflux studies in June 2019. There was abnormal reflux times bilaterally in the proximal veins but it does not look like they were able to obtain reflux in his bilateral lower legs. I am not sure what the issue was here may have been too much edema to visualize the veins 09/04/19; READMISSION this is a now 59 year old man who is been in this clinic at least once before most recently in June of last year. He has lymphedema the exact etiology of which is unclear.  He has been fairly consistent and saying this goes back about 12 years. Almost totally confined to below his  knees he wears 20/30 below-knee stockings. He also has compression pumps at home that he is mostly compliant with. Apparently about 2 weeks ago his legs began to swell. He left his stockings on for a prolonged period of time, didn't take them off in the eventually rubbed wounds in his dorsal feet just distal to the crease of his ankles. He has been unable to get his stockings on since then and has not been using his compression pumps.it is not clear what is been using to the wounds perhaps Bactroban. His past medical history is reviewed and includes type 2 diabetes with a recent hemoglobin A1c of 9, hypertension, Fabry's disease, hyperlipidemia. Brainstem CVA diastolic heart failure, stage III chronic renal failure and lymphedema as noted. his legs are much too large to do ABIs on. Previously we have not felt he had an arterial issue Electronic Signature(s) Signed: 09/10/2019 7:44:34 AM By: [name] Ham MD Entered By: [name] on 09/05/2019 18:45:55 -------------------------------------------------------------------------------- Chemical Cauterization Details Patient Name: Date of Service: [name], [name] 09/04/2019 1:15 PM Medical Record [id number] Patient Account Number: [id number] Date of Birth/Sex: Treating RN: Jan 24, 1961 (58 y.o. [name]) [name] Primary Care Provider: Karle [name] Other Clinician: Referring Provider: Treating Provider/Extender:[name], [name], [name] in Treatment: 0 Procedure Performed for: Wound #3 Right,Dorsal Foot Performed By: Physician [name]., MD Post Procedure Diagnosis Same as Pre-procedure Electronic Signature(s) Signed: 09/10/2019 7:44:34 AM By: [name] Ham MD Previous Signature: 09/05/2019 5:40:01 PM Version By: [name] Coria RN Entered By: [name] on 09/05/2019 18:39:15 -------------------------------------------------------------------------------- Chemical Cauterization Details Patient  Name: Date of Service: [name], [name] 09/04/2019 1:15 PM Medical Record [id number] Patient Account Number: [id number] Date of Birth/Sex: Treating RN: [birth year] (58 y.o. [name]) [name] Primary Care Provider: Karle [name] Other Clinician: Referring Provider: Treating Provider/Extender:Columbia Pandey, [name], [name] in Treatment: 0 Procedure Performed for: Wound #2 Left,Dorsal Foot Performed By: Physician [name]., MD Post Procedure Diagnosis Same as Pre-procedure Electronic Signature(s) Signed: 09/10/2019 7:44:34 AM By: [name] Ham MD Entered By: [name] on 09/05/2019 18:40:11 -------------------------------------------------------------------------------- Physical Exam Details Patient Name: Date of Service: [name], [name] 09/04/2019 1:15 PM Medical Record [id number] Patient Account Number: [id number] Date of Birth/Sex: Treating RN: [birth year] (58 y.o. [name] Primary Care Provider: Karle [name] Other Clinician: Referring Provider: Treating Provider/Extender:Lacretia Tindall, [name], [name] in Treatment: 0 Constitutional Sitting or standing Blood Pressure is within target range for patient.. Pulse regular and within target range for patient.[name] Kitchen Respirations regular, non-labored and within target range.. Temperature is normal and within the target range for the patient.[name] Kitchen Appears in no distress. Eyes Conjunctivae clear. No discharge.no icterus. Respiratory work of breathing is normal. Bilateral breath sounds are clear and equal in all lobes with no wheezes, rales or rhonchi.. Cardiovascular Heart rhythm and rate regular, without murmur or gallop.. Edema present in both extremities.nonpitting 4+.. Integumentary (Hair, Skin) large amount ofnonpitting edema in the lower legs compatible with lymphedema. pantaloon deformities. Notes wound exam; the patient has 2 horizontal fissure wounds in his dorsal foot just  beyond the crease of his ankles. These are clearly where of the stockings had protruded into his skin. There is some depth and some hyper- granulation here. The entire area is worse overnight treated to remove friable and hypertension related tissue. There was no evidence of infection which is indeed fortunate Engineer, maintenance) Signed: 09/10/2019 7:44:34 AM By: Dellia Nims,  Legrand Como MD Entered By: [name] on 09/05/2019 18:50:39 -------------------------------------------------------------------------------- Physician Orders Details Patient Name: Date of Service: [name], [name] 09/04/2019 1:15 PM Medical Record [id number] Patient Account Number: [id number] Date of Birth/Sex: Treating RN: [birth year] (58 y.o. [name] Primary Care Provider: Karle [name] Other Clinician: Referring Provider: Treating Provider/Extender:Gurpreet Mariani, [name], [name] in Treatment: 0 Verbal / Phone Orders: No Diagnosis Coding Follow-up Appointments Return Appointment in 1 week. Dressing Change Frequency Do not change entire dressing for one week. Wound Cleansing May shower with protection. Primary Wound Dressing Wound #2 Left,Dorsal Foot Calcium Alginate with Silver Wound #3 Right,Dorsal Foot Calcium Alginate with Silver Secondary Dressing Wound #2 Left,Dorsal Foot Dry Gauze Wound #3 Right,Dorsal Foot Dry Gauze Edema Control Wound #2 Left,Dorsal Foot 3 Layer Compression System - Bilateral Wound #3 Right,Dorsal Foot 3 Layer Compression System - Bilateral Electronic Signature(s) Signed: 09/05/2019 5:40:01 PM By: [name] Coria RN Signed: 09/10/2019 7:44:34 AM By: [name] Ham MD Entered By: [name] on 09/05/2019 09:34:50 -------------------------------------------------------------------------------- Problem List Details Patient Name: Date of Service: [name] Sells D. 09/04/2019 1:15 PM Medical Record [id number] Patient Account Number:  [id number] Date of Birth/Sex: Treating RN: Apr 10, 1961 (58 y.o. [name] Primary Care Provider: Karle [name] Other Clinician: Referring Provider: Treating Provider/Extender:Jalayne Ganesh, [name], [name] in Treatment: 0 Active Problems ICD-10 Evaluated Encounter Code Description Active Date Today Diagnosis I89.0 Lymphedema, not elsewhere classified 09/04/2019 No Yes L97.512 Non-pressure chronic ulcer of other part of right foot 09/04/2019 No Yes with fat layer exposed L97.522 Non-pressure chronic ulcer of other part of left foot 09/04/2019 No Yes with fat layer exposed Inactive Problems Resolved Problems Electronic Signature(s) Signed: 09/07/2019 5:28:43 PM By: Levan Hurst RN, BSN Signed: 09/10/2019 7:44:34 AM By: [name] Ham MD Entered By: Levan Hurst on 09/06/2019 11:26:00 -------------------------------------------------------------------------------- Progress Note Details Patient Name: Date of Service: [name], [name] 09/04/2019 1:15 PM Medical Record [id number] Patient Account Number: [id number] Date of Birth/Sex: Treating RN: 1960/12/23 (58 y.o. [name] Primary Care Provider: Karle [name] Other Clinician: Referring Provider: Treating Provider/Extender:Sarath Privott, [name], [name] in Treatment: 0 Subjective Chief Complaint Information obtained from Patient The patient is a 59 yrs old bm here for treatment of a buttock ulcer. He has diabetes, hypertension, heart disease, Fabry's disease, gout and chronic kidney disease. He noticed tenderness and pain in the buttock area and was seen in the ED. He was admitted and taken to the OR for debridement of an abscess. His blood sugars are doing much better. He is currently on the [employer]. He has been packing the area by himself at home 11/28/2018. Patient returns today for review of the edema in his legs. 09/04/19; this is a patient who returns for review of wounds on his  bilateral dorsal feet History of Present Illness (HPI) The following HPI elements were documented for the patient's wound: Location: Patient presents with a wound to the left buttock area. Quality: Patient reports experiencing a dull pain to affected area(s). Severity: Mildly severe wound with no evidence of infection Duration: Patient has had the wound for   2 weeks prior to presenting for treatment Timing: Pain in wound is Intermittent (comes and goes) ADMISSION 11/28/2018 This is a pleasant 59 year old man who has chronic lymphedema since about 2010. The exact etiology of this is not really clear indeed if this is been worked up it is not really clear. In any case he also has type 2 diabetes. He was in hospital recently for a small brainstem CVA discharged in  late May with bilateral Unna wraps on his legs. Is not clear that he had wounds in the hospital. Nevertheless he comes in today with the same Unna boots on his legs. He has compression stockings at home but they are old and he wanted to get remeasured. He went to [facility] however they said that they would need up-to-date measurements which I believe we can provide today. He also has compression pumps which he uses intermittently at home. I am not really sure who ordered these. He does not have a wound history but states that when he went in the hospital his legs were so big that his stockings did not fit at home Past medical history includes type 2 diabetes, lymphedema, bursitis in the left knee on 10/23/2018, hyper lipidemia, recent brainstem CVA leaving him with some neurologic disability but much better, Febres disease, pacemaker and hypertension. The patient had venous reflux studies in June 2019. There was abnormal reflux times bilaterally in the proximal veins but it does not look like they were able to obtain reflux in his bilateral lower legs. I am not sure what the issue was here may have been too much edema to visualize  the veins 09/04/19; READMISSION this is a now 59 year old man who is been in this clinic at least once before most recently in June of last year. He has lymphedema the exact etiology of which is unclear. He has been fairly consistent and saying this goes back about 12 years. Almost totally confined to below his knees he wears 20/30 below-knee stockings. He also has compression pumps at home that he is mostly compliant with. Apparently about 2 weeks ago his legs began to swell. He left his stockings on for a prolonged period of time, didn't take them off in the eventually rubbed wounds in his dorsal feet just distal to the crease of his ankles. He has been unable to get his stockings on since then and has not been using his compression pumps.it is not clear what is been using to the wounds perhaps Bactroban. His past medical history is reviewed and includes type 2 diabetes with a recent hemoglobin A1c of 9, hypertension, Fabry's disease, hyperlipidemia. Brainstem CVA diastolic heart failure, stage III chronic renal failure and lymphedema as noted. his legs are much too large to do ABIs on. Previously we have not felt he had an arterial issue Patient History Information obtained from Patient. Allergies Shellfish Containing Products (Severity: Moderate, Reaction: itching), lisinopril (Severity: Severe, Reaction: anaphylaxis) Family History Diabetes - Maternal Grandparents, Kidney Disease - Siblings, Seizures - Mother, Stroke - Mother, No family history of Cancer, Heart Disease, Hereditary Spherocytosis, Hypertension, Lung Disease, Thyroid Problems, Tuberculosis. Social History Never smoker, Marital Status - Single, Alcohol Use - Never, Drug Use - No History, Caffeine Use - Moderate. Medical History Ear/Nose/Mouth/Throat Denies history of Chronic sinus problems/congestion, Middle ear problems Hematologic/Lymphatic Patient has history of Lymphedema Denies history of Anemia, Hemophilia, Human  Immunodeficiency Virus, Sickle Cell Disease Respiratory Denies history of Aspiration, Asthma, Chronic Obstructive Pulmonary Disease (COPD), Pneumothorax, Sleep Apnea, Tuberculosis Cardiovascular Patient has history of Arrhythmia - mobitz II AV block, Congestive Heart Failure - genetic condition, Hypertension Endocrine Patient has history of Type II Diabetes - long term Denies history of Type I Diabetes Genitourinary Patient has history of End Stage Renal Disease - stage II Integumentary (Skin) Denies history of History of Burn Musculoskeletal Patient has history of Gout - due to renal failure Denies history of Rheumatoid Arthritis, Osteoarthritis, Osteomyelitis Oncologic Denies  history of Received Chemotherapy, Received Radiation Psychiatric Denies history of Anorexia/bulimia, Confinement Anxiety Patient is treated with Insulin. Blood sugar is tested. Blood sugar results noted at the following times: Breakfast - <200, Bedtime - 140's. Hospitalization/Surgery History - pacemaker. - IandD upper back abscess. - IandD perirectal abscess. - umbilical hernia repair. - cardiac cath. Medical And Surgical History Notes Constitutional Symptoms (General Health) obesity Eyes double vision s/p CVA Respiratory chronic bronchitis Cardiovascular patient has Fabry's disease, cardiomyopathy, pacemaker Neurologic CVA Review of Systems (ROS) Constitutional Symptoms (General Health) Denies complaints or symptoms of Fatigue, Fever, Chills, Marked Weight Change. Eyes Denies complaints or symptoms of Dry Eyes, Vision Changes, Glasses / Contacts. Ear/Nose/Mouth/Throat Denies complaints or symptoms of Chronic sinus problems or rhinitis. Cardiovascular Denies complaints or symptoms of Chest pain. Gastrointestinal Denies complaints or symptoms of Frequent diarrhea, Nausea, Vomiting. Genitourinary Denies complaints or symptoms of Frequent urination. Integumentary (Skin) Complains or has symptoms  of Wounds - bil dorsal feet. Musculoskeletal Denies complaints or symptoms of Muscle Pain, Muscle Weakness. Neurologic Denies complaints or symptoms of Numbness/parasthesias. Psychiatric Denies complaints or symptoms of Claustrophobia, Suicidal. Objective Constitutional Sitting or standing Blood Pressure is within target range for patient.. Pulse regular and within target range for patient.[name] Kitchen Respirations regular, non-labored and within target range.. Temperature is normal and within the target range for the patient.[name] Kitchen Appears in no distress. Vitals Time Taken: 1:25 PM, Height: 70 in, Source: Stated, Weight: 284 lbs, Source: Stated, BMI: 40.7, Temperature: 98.3 F, Pulse: 64 bpm, Respiratory Rate: 18 breaths/min, Blood Pressure: 129/40 mmHg, Capillary Blood Glucose: 152 mg/dl. Eyes Conjunctivae clear. No discharge.no icterus. Respiratory work of breathing is normal. Bilateral breath sounds are clear and equal in all lobes with no wheezes, rales or rhonchi.. Cardiovascular Heart rhythm and rate regular, without murmur or gallop.. Edema present in both extremities.nonpitting 4+.. General Notes: wound exam; the patient has 2 horizontal fissure wounds in his dorsal foot just beyond the crease of his ankles. These are clearly where of the stockings had protruded into his skin. There is some depth and some hyper-granulation here. The entire area is worse overnight treated to remove friable and hypertension related tissue. There was no evidence of infection which is indeed fortunate Integumentary (Hair, Skin) large amount ofnonpitting edema in the lower legs compatible with lymphedema. pantaloon deformities. Wound #2 status is Open. Original cause of wound was Pressure Injury. The wound is located on the Left,Dorsal Foot. The wound measures 0.6cm length x 5.7cm width x 0.3cm depth; 2.686cm^2 area and 0.806cm^3 volume. There is Fat Layer (Subcutaneous Tissue) Exposed exposed. There is no  tunneling or undermining noted. There is a small amount of serosanguineous drainage noted. The wound margin is distinct with the outline attached to the wound base. There is large (67-100%) red, hyper - granulation within the wound bed. There is no necrotic tissue within the wound bed. Wound #3 status is Open. Original cause of wound was Pressure Injury. The wound is located on the Right,Dorsal Foot. The wound measures 0.6cm length x 3.7cm width x 0.2cm depth; 1.744cm^2 area and 0.349cm^3 volume. There is Fat Layer (Subcutaneous Tissue) Exposed exposed. There is no tunneling or undermining noted. There is a small amount of serosanguineous drainage noted. The wound margin is distinct with the outline attached to the wound base. There is large (67-100%) red, hyper - granulation within the wound bed. There is a small (1-33%) amount of necrotic tissue within the wound bed including Adherent Slough. Assessment Active Problems ICD-10 Lymphedema, not elsewhere classified Non-pressure  chronic ulcer of other part of right foot with fat layer exposed Non-pressure chronic ulcer of other part of left foot with fat layer exposed Procedures Wound #2 Pre-procedure diagnosis of Wound #2 is a Lymphedema located on the Left,Dorsal Foot . There was a Three Layer Compression Therapy Procedure by [name] Coria, RN. Post procedure Diagnosis Wound #2: Same as Pre-Procedure Pre-procedure diagnosis of Wound #2 is a Lymphedema located on the Left,Dorsal Foot . An Chemical Cauterization procedure was performed by [name]., MD. Post procedure Diagnosis Wound #2: Same as Pre-Procedure Wound #3 Pre-procedure diagnosis of Wound #3 is a Lymphedema located on the Right,Dorsal Foot . There was a Three Layer Compression Therapy Procedure by [name] Coria, RN. Post procedure Diagnosis Wound #3: Same as Pre-Procedure Pre-procedure diagnosis of Wound #3 is a Lymphedema located on the Right,Dorsal Foot . An  Chemical Cauterization procedure was performed by [name]., MD. Post procedure Diagnosis Wound #3: Same as Pre-Procedure Plan Follow-up Appointments: Return Appointment in 1 week. Dressing Change Frequency: Do not change entire dressing for one week. Wound Cleansing: May shower with protection. Primary Wound Dressing: Wound #2 Left,Dorsal Foot: Calcium Alginate with Silver Wound #3 Right,Dorsal Foot: Calcium Alginate with Silver Secondary Dressing: Wound #2 Left,Dorsal Foot: Dry Gauze Wound #3 Right,Dorsal Foot: Dry Gauze Edema Control: Wound #2 Left,Dorsal Foot: 3 Layer Compression System - Bilateral Wound #3 Right,Dorsal Foot: 3 Layer Compression System - Bilateral 1; this patient has severe lymphedema. The exact cause, and the extent of any previous workup is unclear. He says this is been present for about 12 years although patients often underestimate this. He has had 2 short previous visits in this clinic in 2016 and then again in 2020. He has below-knee stockings and compression pumps and generally has been reasonably compliant #2 these wounds are in a very difficult area on the dorsal foot just distal to a massive ankle crease. We will use silver alginate/ABDs and start with 3 layer compression. I don't think he has an arterial issue although his pedal pulses are not palpable because of edema. His feet are warm. If he tolerates this and the wounds don't heal for later compression might be possible. I'll try to see if he has had previous arterial studies. ABIs would not be possible but TB I's might be #3 the patient is a poorly controlled diabetic #4 I've told him that it would be possible to use his compression pumps over the 3 layer wraps as long as he does not experience pain I spent 40 minutes in review of this patient's records, face-to-face evaluation and preparation of this record Electronic Signature(s) Signed: 09/10/2019 7:44:34 AM By: [name] Ham  MD Entered By: [name] on 09/05/2019 18:54:18 -------------------------------------------------------------------------------- HxROS Details Patient Name: Date of Service: [name] Sells D. 09/04/2019 1:15 PM Medical Record [id number] Patient Account Number: [id number] Date of Birth/Sex: Treating RN: Apr 09, 1961 (59 y.o. Ernestene Mention Primary Care Provider: Karle [name] Other Clinician: Referring Provider: Treating Provider/Extender:Anis Cinelli, [name], [name] in Treatment: 0 Information Obtained From Patient Constitutional Symptoms (General Health) Complaints and Symptoms: Negative for: Fatigue; Fever; Chills; Marked Weight Change Medical History: Past Medical History Notes: obesity Eyes Complaints and Symptoms: Negative for: Dry Eyes; Vision Changes; Glasses / Contacts Medical History: Past Medical History Notes: double vision s/p CVA Ear/Nose/Mouth/Throat Complaints and Symptoms: Negative for: Chronic sinus problems or rhinitis Medical History: Negative for: Chronic sinus problems/congestion; Middle ear problems Cardiovascular Complaints and Symptoms: Negative for: Chest pain Medical History: Positive for: Arrhythmia -  mobitz II AV block; Congestive Heart Failure - genetic condition; Hypertension Past Medical History Notes: patient has Fabry's disease, cardiomyopathy, pacemaker Gastrointestinal Complaints and Symptoms: Negative for: Frequent diarrhea; Nausea; Vomiting Genitourinary Complaints and Symptoms: Negative for: Frequent urination Medical History: Positive for: End Stage Renal Disease - stage II Integumentary (Skin) Complaints and Symptoms: Positive for: Wounds - bil dorsal feet Medical History: Negative for: History of Burn Musculoskeletal Complaints and Symptoms: Negative for: Muscle Pain; Muscle Weakness Medical History: Positive for: Gout - due to renal failure Negative for: Rheumatoid Arthritis; Osteoarthritis;  Osteomyelitis Neurologic Complaints and Symptoms: Negative for: Numbness/parasthesias Medical History: Past Medical History Notes: CVA Psychiatric Complaints and Symptoms: Negative for: Claustrophobia; Suicidal Medical History: Negative for: Anorexia/bulimia; Confinement Anxiety Hematologic/Lymphatic Medical History: Positive for: Lymphedema Negative for: Anemia; Hemophilia; Human Immunodeficiency Virus; Sickle Cell Disease Respiratory Medical History: Negative for: Aspiration; Asthma; Chronic Obstructive Pulmonary Disease (COPD); Pneumothorax; Sleep Apnea; Tuberculosis Past Medical History Notes: chronic bronchitis Endocrine Medical History: Positive for: Type II Diabetes - long term Negative for: Type I Diabetes Time with diabetes: long term Treated with: Insulin Blood sugar tested every day: Yes Tested : 3 times per day Blood sugar testing results: Breakfast: <200; Bedtime: 140's Immunological Oncologic Medical History: Negative for: Received Chemotherapy; Received Radiation Immunizations Pneumococcal Vaccine: Received Pneumococcal Vaccination: Yes Implantable Devices Yes Hospitalization / Surgery History Type of Hospitalization/Surgery pacemaker IandD upper back abscess IandD perirectal abscess umbilical hernia repair cardiac cath Family and Social History Cancer: No; Diabetes: Yes - Maternal Grandparents; Heart Disease: No; Hereditary Spherocytosis: No; Hypertension: No; Kidney Disease: Yes - Siblings; Lung Disease: No; Seizures: Yes - Mother; Stroke: Yes - Mother; Thyroid Problems: No; Tuberculosis: No; Never smoker; Marital Status - Single; Alcohol Use: Never; Drug Use: No History; Caffeine Use: Moderate; Financial Concerns: No; Food, Clothing or Shelter Needs: No; Support System Lacking: No; Transportation Concerns: No Engineer, maintenance) Signed: 09/05/2019 6:50:57 PM By: Baruch Gouty RN, BSN Signed: 09/10/2019 7:44:34 AM By: [name] Ham  MD Entered By: Baruch Gouty on 09/04/2019 13:43:49 -------------------------------------------------------------------------------- SuperBill Details Patient Name: Date of Service: [name], [name] 09/04/2019 Medical Record [id number] Patient Account Number: [id number] Date of Birth/Sex: Treating RN: 1961/04/07 (58 y.o. [name]) [name] Primary Care Provider: Karle [name] Other Clinician: Referring Provider: Treating Provider/Extender:Dailan Pfalzgraf, [name], [name] in Treatment: 0 Diagnosis Coding ICD-10 Codes Code Description I89.0 Lymphedema, not elsewhere classified L97.512 Non-pressure chronic ulcer of other part of right foot with fat layer exposed L97.522 Non-pressure chronic ulcer of other part of left foot with fat layer exposed Facility Procedures CPT4: Code [id number] Description: 213 - WOUND CARE VISIT-LEV 3 EST PT Modifier Quantity: 25 1 CPT4: 9233007622 (b Description: 633 BILATERAL: Application of multi-layer venous compression system; leg elow knee), including ankle and foot. Modifier Quantity: 25 1 Physician Procedures CPT4 Code Description: 3545625 [id number] - WC PHYS LEVEL 4 - EST PT ICD-10 Diagnosis Description I89.0 Lymphedema, not elsewhere classified L97.512 Non-pressure chronic ulcer of other part of right foot wi L97.522 Non-pressure chronic ulcer of other part of  left foot wit Modifier: th fat layer ex h fat layer exp Quantity: 1 posed osed CPT4 Code Description: 7342876 81157 - WC PHYS CHEM CAUT GRAN TISSUE ICD-10 Diagnosis Description L97.512 Non-pressure chronic ulcer of other part of right foot with L97.522 Non-pressure chronic ulcer of other part of left foot with f Modifier: fat layer expos at layer expose Quantity: 1 ed d Electronic Signature(s) Signed: 09/07/2019 5:28:43 PM By: Levan Hurst RN, BSN Signed: 09/10/2019 7:44:34 AM By: [name] Ham  MD Previous Signature: 09/05/2019 5:40:01 PM Version By: [name] Coria RN Entered By:  Levan Hurst on 09/06/2019 11:26:54

## 2019-09-10 NOTE — Therapy (Addendum)
[facility] [address] [place], [place], [address] Phone: [phone]   Fax:  [phone]  Physical Therapy Treatment/10th Visit Progress Note  Patient Details  Name: [name] MRN: [id number] Date of Birth: [birth year] Referring Provider (PT): Frann Rider, NP   Encounter Date: 09/10/2019  PT End of Session - 09/10/19 2003    Visit Number  10    Number of Visits  18    Date for PT Re-Evaluation  10/21/19    Authorization Type  [employer] Medicare-will need 10th visit progress note    PT Start Time  0931    PT Stop Time  1015    PT Time Calculation (min)  44 min    Activity Tolerance  Patient tolerated treatment well    Behavior During Therapy  [facility] for tasks assessed/performed       Past Medical History:  Diagnosis Date  . Bell's palsy   . Bifascicular block   . Chronic bronchitis ([place])    "seasonal; get it q yr"  . Chronic diastolic CHF (congestive heart failure) ([place]) 2010  . Chronic kidney disease (CKD), stage II (mild)    stage II to III/notes 07/23/2014  . Diastolic heart failure secondary to hypertrophic cardiomyopathy ([place]) CARDIOLOGIST-- DR Daneen Schick  . Dyspnea    increased exertion   . Edema 07/2013  . 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 Gout, arthritis 2014   bil feet. right worse  . History of cellulitis of skin with lymphangitis LEFT LEG  . HOCM (hypertrophic obstructive cardiomyopathy) ([place])    a. Echo 11/16: Severe LVH, EF 55-60%, abnormal GLS consistent with HOCM, no SAM, mild LAE  . Hypertension 20 years  . Lymphedema of lower extremity LEFT  >  RIGHT   "using ankle high socks at home; pump doesn't work for me" (07/23/2014)  . Pacemaker    02/12/11  . Pneumonia 08/2011  . Seasonal asthma NO INHALERS   30 years  . Second degree Mobitz II AV block    with syncope, s/p PPM  . Short of breath on exertion   . Type II diabetes mellitus ([place]) 2012    INSULIN DEPENDENT    Past Surgical History:  Procedure Laterality Date  . CARDIAC CATHETERIZATION  03-12-2011  DR Daneen Schick   HYPERTROPHIC CARDIOMYOPATHY WITH LV CAVITY  APPEARANCE CONSISTANT WITH SIGNIFICANT APICAL HYPERTROPHY/ NORMAL LVSF / EF 55%/ EVIDENCE OF DIASTOLIC DYSFUNCTION WITH EDP OF 23-51mHg AFTER A-WAVE/ NORMAL CORONARY ARTERIES  . EVALUATION UNDER ANESTHESIA WITH FISTULECTOMY N/A 06/23/2015   Procedure: EXAM UNDER ANESTHESIA WITH POSSIBLE FISTULOTOMY;  Surgeon: JJudeth Horn MD;  [facility]  Service: General;  Laterality: N/A;  . HERNIA REPAIR  [id number]  Umbilical Hernia Repair  . INCISION AND DRAINAGE PERIRECTAL ABSCESS Left 07/29/2014   Procedure: IRRIGATION AND DEBRIDEMENT PERIRECTAL ABSCESS;  Surgeon: JDoreen Salvage MD;  Location: [facility]  Service: General;  Laterality: Left;  Prone position  . INSERT / REPLACE / REMOVE PACEMAKER  02/12/2011   SJM implanted by Dr ARayann Hemanfor Mobitz II second degree AV block and syncope  . IRRIGATION AND DEBRIDEMENT ABSCESS N/A 07/27/2013   Procedure: IRRIGATION AND DEBRIDEMENT OF SKIN, SOFT TISSUE AND MUSCLES OF UPPER BACK (11X19X4cm) WITH 10 BLADE AND PULSATILE LAVAGE ;  Surgeon: EGayland Curry MD;  [facility]  Service: General;  Laterality: N/A;  . PPM GENERATOR CHANGEOUT N/A 05/22/2019   Procedure: PPM GENERATOR CHANGEOUT;  Surgeon: Thompson Grayer, MD;  Location: [facility];  Service: Cardiovascular;  Laterality: N/A;  . UMBILICAL HERNIA REPAIR  03/22/2012   Procedure: HERNIA REPAIR UMBILICAL ADULT;  Surgeon: Leighton Ruff, MD;  Location: [facility];  Service: General;  Laterality: N/A;  Umbilical Hernia Repair     There were no vitals filed for this visit.  Subjective Assessment - 09/10/19 0938    Subjective  Denies any falls or changes since last visit.  Getting legs checked tomorrow at wound clinic.    Patient Stated Goals  To work on this off-balance-ness.    Currently in Pain?  No/denies        [facility] Adult PT  Treatment/Exercise - 09/10/19 0001      High Level Balance   High Level Balance Activities  Marching forwards;Marching backwards;Other (comment)    High Level Balance Comments  In // bars on blue foam for forward<>backward walking then forward<>backward marching with 1-2 UE support.  Standing on foam mat with taps to 6" step 2 UE support then standing on blue mat stepping onto/off step.  Standing at steps for taps alternating LE's x 15 reps x 2 sets to 6" step then stepping onto/off 6" step forward x 15 thps then lateral each directions x 15 reps.  Pt using 1-2 UE assist for support.  Pt needing seated rest breaks during session and having some mild dyspnea that resolved with rest.        Self-Care   Self-Care  Other Self-Care Comments    Other Self-Care Comments   Discussed having pt bring in his "cast shoes" for next session.  Pt with significant pronation/medial weight bearing on his shoes due to edema in LE's.  Pt states he has a hard time finding shoes to fit.  Pt reports having cast shoes but has not worn in a while due to not being able to connect straps.  Discussed PTA's concern over pressure from sole of current shoes to plantar aspect.                PT Short Term Goals - 09/05/19 1732      PT SHORT TERM GOAL #1   Title  Pt will be independent with HEP for improved balance, strength, gait for decreased fall risk.  TARGET for all STGS: 08/24/2019    Time  5    Period  Weeks    Status  Achieved      PT SHORT TERM GOAL #2   Title  Pt will improve 5x sit<>stand to less than or equal to 16 seconds for improved functional lower extremity strength.    Baseline  16.59 sec at best 08/16/2019    Time  5    Period  Weeks    Status  Partially Met      PT SHORT TERM GOAL #3   Title  Pt will improve TUG score to less than or equal to 18 seconds for decreased fall risk.    Baseline  TUG 14 sec 08/16/2019    Time  5    Period  Weeks    Status  Achieved      PT SHORT TERM GOAL #4    Title  Pt will improve DGI score to at least 16/24 for decreased fall risk.    Baseline  15/24 08/16/2019    Time  5    Period  Weeks    Status  Partially Met      PT SHORT TERM GOAL #5  Title  Pt will verbalize understanding of fall prevention in home environment.    Time  5    Period  Weeks    Status  Achieved        PT Long Term Goals - 07/23/19 1227      PT LONG TERM GOAL #1   Title  Pt will be independent with progression of HEP to address strength, balance, and gait for decreased fall risk.  TARGET 09/21/2019    Time  9    Period  Weeks    Status  New      PT LONG TERM GOAL #2   Title  Pt will improve 5x sit<>stand to less than or equal to 12.5 seconds for improved functional strength.    Time  9    Period  Weeks    Status  New      PT LONG TERM GOAL #3   Title  Pt will improve TUG score to less than or equal to 15 seconds for decreased fall risk.    Time  9    Period  Weeks    Status  New      PT LONG TERM GOAL #4   Title  Pt will improve DGI to at least 19/24 for decreased fall risk.    Time  9    Period  Weeks    Status  New      PT LONG TERM GOAL #5   Title  Pt will improve gait velocity to at least 2.3 ft/sec for improved gait efficiency and safety in community.    Time  9    Period  Weeks    Status  New            Plan - 09/10/19 2003    Clinical Impression Statement  Pt continues to have significant LE lymphedema impacting his mobility.  Pt continues to report LE's feeling heavy but has improved slightly with wrappings done at wound clinic.  Continues with decreased endurance and need for rest breaks during session.  Cont PT per POC.    Personal Factors and Comorbidities  Comorbidity 3+    Comorbidities  DM, pacemaker, lymphedema in BLEs, FAbry's disease, diastolic heart failure, chronic kidney disease, heart failure, Bell's palsy.    Examination-Activity Limitations  Locomotion Level;Transfers    Examination-Participation Restrictions  Community  Activity;Shop;Volunteer    Stability/Clinical Decision Making  Evolving/Moderate complexity    Rehab Potential  Good    PT Frequency  Other (comment)   1x/wk for 1 week, then 2x/wk for 8 weeks   PT Duration  Other (comment)   total POC = 9 weeks   PT Treatment/Interventions  ADLs/Self Care Home Management;Gait training;Stair training;Functional mobility training;Therapeutic activities;Therapeutic exercise;Balance training;Neuromuscular re-education;Patient/family education    PT Next Visit Plan  Assess fit of cast shoes and safety with shoes and mobility. Continue SLS and hip strengthening, dynamic balance on compliant surfaces, review turns    Consulted and Agree with Plan of Care  Patient       Patient will benefit from skilled therapeutic intervention in order to improve the following deficits and impairments:  Abnormal gait, Difficulty walking, Decreased balance, Decreased mobility, Decreased strength  Visit Diagnosis: Unsteadiness on feet  Muscle weakness (generalized)  Other abnormalities of gait and mobility     Problem List Patient Active Problem List   Diagnosis Date Noted  . Morbid obesity ([place]) 08/23/2019  . 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 CVA (cerebral vascular accident) ([place])  10/20/2018  . UTI (urinary tract infection) 10/19/2018  . Upper airway cough syndrome 08/01/2018  . Lymphedema 10/06/2016  . Gout, arthritis   . 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 Onychomycosis of toenail 09/26/2014  . Diabetes mellitus type 2 in obese ([place])   . Iron deficiency anemia 07/29/2014  . CKD (chronic kidney disease), stage III ([place])   . Chronic diastolic HF (heart failure) ([place]) 03/27/2013  . Peripheral edema 07/20/2012  . Obstructive sleep apnea, suspected 06/21/2012  . Pacemaker-St.Jude 02/23/2012  . Hypertension 08/09/2011  . COPD with asthma ([place]) 08/09/2011  . Mobitz type II atrioventricular block 05/19/2011  . 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[place]) 05/19/2011    Narda Bonds, PTA [facility] 09/10/19 8:06 PM Phone: [phone] Fax: [phone]   10th Visit Progress Note:  Pt has been seen for 10 visits from 07/23/2019 to 09/10/2019.  Recent objective measures taking at STG check 08/16/2019 (then pt missed several weeks of therapy due to scheduling conflicts):  5x sit<>stand (16.59 sec, improved from 20.56 sec), TUG score 14 seconds, improved from 23.97 sec; DGI score 15/24 improved from 12/24.  Pt reports some improvement in balance, but lymphedema and swelling of BLEs has been a limiting factor.  Pt is motivated for therapy and remains at fall risk per TUG, DGI scores.  He will continue to beneift from skilled PT to further improve functional mobility and decrease fall risk.  Plan to continue therapy towards LTGs.  Mady Haagensen, PT 09/11/19 4:29 PM Phone: [phone] Fax: [facility] [address] [place], [place], [address] Phone: [phone]   Fax:  [phone]  Name: [name] MRN: [id number] Date of Birth: Aug 31, 1960

## 2019-09-10 NOTE — Telephone Encounter (Signed)
Pt came and dropped off paperwork

## 2019-09-11 ENCOUNTER — Encounter (HOSPITAL_BASED_OUTPATIENT_CLINIC_OR_DEPARTMENT_OTHER): Payer: Medicare HMO | Attending: Internal Medicine | Admitting: Internal Medicine

## 2019-09-11 DIAGNOSIS — E785 Hyperlipidemia, unspecified: Secondary | ICD-10-CM | POA: Diagnosis not present

## 2019-09-11 DIAGNOSIS — Z950 Presence of cardiac pacemaker: Secondary | ICD-10-CM | POA: Insufficient documentation

## 2019-09-11 DIAGNOSIS — Z8673 Personal history of transient ischemic attack (TIA), and cerebral infarction without residual deficits: Secondary | ICD-10-CM | POA: Diagnosis not present

## 2019-09-11 DIAGNOSIS — N183 Chronic kidney disease, stage 3 unspecified: Secondary | ICD-10-CM | POA: Insufficient documentation

## 2019-09-11 DIAGNOSIS — E1122 Type 2 diabetes mellitus with diabetic chronic kidney disease: Secondary | ICD-10-CM | POA: Diagnosis not present

## 2019-09-11 DIAGNOSIS — E11621 Type 2 diabetes mellitus with foot ulcer: Secondary | ICD-10-CM | POA: Diagnosis not present

## 2019-09-11 DIAGNOSIS — L97512 Non-pressure chronic ulcer of other part of right foot with fat layer exposed: Secondary | ICD-10-CM | POA: Insufficient documentation

## 2019-09-11 DIAGNOSIS — L97522 Non-pressure chronic ulcer of other part of left foot with fat layer exposed: Secondary | ICD-10-CM | POA: Insufficient documentation

## 2019-09-11 DIAGNOSIS — I132 Hypertensive heart and chronic kidney disease with heart failure and with stage 5 chronic kidney disease, or end stage renal disease: Secondary | ICD-10-CM | POA: Diagnosis not present

## 2019-09-11 DIAGNOSIS — L89899 Pressure ulcer of other site, unspecified stage: Secondary | ICD-10-CM | POA: Diagnosis not present

## 2019-09-11 DIAGNOSIS — I5032 Chronic diastolic (congestive) heart failure: Secondary | ICD-10-CM | POA: Insufficient documentation

## 2019-09-11 DIAGNOSIS — I890 Lymphedema, not elsewhere classified: Secondary | ICD-10-CM | POA: Insufficient documentation

## 2019-09-11 DIAGNOSIS — E7521 Fabry (-Anderson) disease: Secondary | ICD-10-CM | POA: Diagnosis not present

## 2019-09-12 ENCOUNTER — Other Ambulatory Visit: Payer: Self-pay

## 2019-09-12 ENCOUNTER — Ambulatory Visit: Payer: Medicare HMO | Admitting: Physical Therapy

## 2019-09-12 ENCOUNTER — Other Ambulatory Visit: Payer: Self-pay | Admitting: Internal Medicine

## 2019-09-12 ENCOUNTER — Encounter: Payer: Self-pay | Admitting: Internal Medicine

## 2019-09-12 ENCOUNTER — Encounter: Payer: Self-pay | Admitting: Physical Therapy

## 2019-09-12 VITALS — BP 126/83 | HR 103

## 2019-09-12 DIAGNOSIS — R2681 Unsteadiness on feet: Secondary | ICD-10-CM

## 2019-09-12 DIAGNOSIS — R2689 Other abnormalities of gait and mobility: Secondary | ICD-10-CM

## 2019-09-12 DIAGNOSIS — I10 Essential (primary) hypertension: Secondary | ICD-10-CM

## 2019-09-12 DIAGNOSIS — M6281 Muscle weakness (generalized): Secondary | ICD-10-CM

## 2019-09-12 MED ORDER — NOVOLOG MIX 70/30 FLEXPEN (70-30) 100 UNIT/ML ~~LOC~~ SUPN: 28.0000 [IU] | PEN_INJECTOR | Freq: Two times a day (BID) | SUBCUTANEOUS | 11 refills | Status: DC

## 2019-09-12 NOTE — Therapy (Signed)
[facility] [address] [place], [place], [address] Phone: [phone]   Fax:  [phone]  Physical Therapy Treatment  Patient Details  Name: [name] MRN: [id number] Date of Birth: [birth year] Referring Provider (PT): Frann Rider, NP   Encounter Date: 09/12/2019  PT End of Session - 09/12/19 0952    Visit Number  11    Number of Visits  18    Date for PT Re-Evaluation  10/21/19    Authorization Type  [employer] Medicare-will need 10th visit progress note    PT Start Time  0934    PT Stop Time  1015    PT Time Calculation (min)  41 min    Activity Tolerance  Patient tolerated treatment well    Behavior During Therapy  [facility] for tasks assessed/performed       Past Medical History:  Diagnosis Date  . Bell's palsy   . Bifascicular block   . Chronic bronchitis ([place])    "seasonal; get it q yr"  . Chronic diastolic CHF (congestive heart failure) ([place]) 2010  . Chronic kidney disease (CKD), stage II (mild)    stage II to III/notes 07/23/2014  . Diastolic heart failure secondary to hypertrophic cardiomyopathy ([place]) CARDIOLOGIST-- DR Daneen Schick  . Dyspnea    increased exertion   . Edema 07/2013  . 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 Gout, arthritis 2014   bil feet. right worse  . History of cellulitis of skin with lymphangitis LEFT LEG  . HOCM (hypertrophic obstructive cardiomyopathy) ([place])    a. Echo 11/16: Severe LVH, EF 55-60%, abnormal GLS consistent with HOCM, no SAM, mild LAE  . Hypertension 20 years  . Lymphedema of lower extremity LEFT  >  RIGHT   "using ankle high socks at home; pump doesn't work for me" (07/23/2014)  . Pacemaker    02/12/11  . Pneumonia 08/2011  . Seasonal asthma NO INHALERS   30 years  . Second degree Mobitz II AV block    with syncope, s/p PPM  . Short of breath on exertion   . Type II diabetes mellitus ([place]) 2012   INSULIN DEPENDENT     Past Surgical History:  Procedure Laterality Date  . CARDIAC CATHETERIZATION  03-12-2011  DR Daneen Schick   HYPERTROPHIC CARDIOMYOPATHY WITH LV CAVITY  APPEARANCE CONSISTANT WITH SIGNIFICANT APICAL HYPERTROPHY/ NORMAL LVSF / EF 55%/ EVIDENCE OF DIASTOLIC DYSFUNCTION WITH EDP OF 23-34mHg AFTER A-WAVE/ NORMAL CORONARY ARTERIES  . EVALUATION UNDER ANESTHESIA WITH FISTULECTOMY N/A 06/23/2015   Procedure: EXAM UNDER ANESTHESIA WITH POSSIBLE FISTULOTOMY;  Surgeon: JJudeth Horn MD;  Location: [facility]  Service: General;  Laterality: N/A;  . HERNIA REPAIR  [id number]  Umbilical Hernia Repair  . INCISION AND DRAINAGE PERIRECTAL ABSCESS Left 07/29/2014   Procedure: IRRIGATION AND DEBRIDEMENT PERIRECTAL ABSCESS;  Surgeon: JDoreen Salvage MD;  [facility]  Service: General;  Laterality: Left;  Prone position  . INSERT / REPLACE / REMOVE PACEMAKER  02/12/2011   SJM implanted by Dr ARayann Hemanfor Mobitz II second degree AV block and syncope  . IRRIGATION AND DEBRIDEMENT ABSCESS N/A 07/27/2013   Procedure: IRRIGATION AND DEBRIDEMENT OF SKIN, SOFT TISSUE AND MUSCLES OF UPPER BACK (11X19X4cm) WITH 10 BLADE AND PULSATILE LAVAGE ;  Surgeon: EGayland Curry MD;  [facility]  Service: General;  Laterality: N/A;  . PPM GENERATOR CHANGEOUT N/A 05/22/2019   Procedure: PPM GENERATOR CHANGEOUT;  Surgeon: ARayann Heman  Jeneen Rinks, MD;  Location: [facility];  Service: Cardiovascular;  Laterality: N/A;  . UMBILICAL HERNIA REPAIR  03/22/2012   Procedure: HERNIA REPAIR UMBILICAL ADULT;  Surgeon: Leighton Ruff, MD;  Location: [facility];  Service: General;  Laterality: N/A;  Umbilical Hernia Repair     Vitals:   09/12/19 1012  BP: 126/83  Pulse: (!) 103    Subjective Assessment - 09/12/19 0937    Subjective  Went to have wraps on legs changed yesterday.  Wound is still present but smaller per pt.    Patient Stated Goals  To work on this off-balance-ness.    Currently in Pain?  No/denies             [facility] Adult PT  Treatment/Exercise - 09/12/19 0001      Transfers   Transfers  Sit to Stand;Stand to Sit    Sit to Stand  5: Supervision;Without upper extremity assist;From chair/3-in-1    Stand to Sit  5: Supervision    Number of Reps  Other reps (comment)   through out session between activities     Ambulation/Gait   Ambulation/Gait  Yes    Ambulation/Gait Assistance  5: Supervision    Ambulation Distance (Feet)  220 Feet   x 2 and 110' x 2 plus in gym for activities   Assistive device  Straight cane    Gait Pattern  Step-through pattern;Lateral hip instability;Poor foot clearance - left;Poor foot clearance - right;Narrow base of support    Ambulation Surface  Level;Indoor    Pre-Gait Activities  in // bars for giat with bil UE with Velcro Darco shoes to assess balance before attempting gait with cane    Gait Comments  pt's shoes do no allow for his foot to remain centered on sole so he tends to walk on medial edge of the sole.  Trialed velcro "cast like" shoes that pt had used previously.  Pts gait appeared steadier with decreased lateral sway/wt shift to gait but concerned about pt keeping shoes in place due to velcro.  Pt also had improved heel strike with velco shoes.  Discussed having pt wear them inside around house to see if he feels like they provide more stability to his feet/ankles.        High Level Balance   High Level Balance Activities  Other (comment)    High Level Balance Comments  in // bars on regular and large rockerboard then red foam beam working on ankle strategy and stability.  Pt requires 1-2 UE support to maintain balance.  Could briefly let go but then would use bars to catch himself.  Balance did improve with increased time on beam.               PT Short Term Goals - 09/05/19 1732      PT SHORT TERM GOAL #1   Title  Pt will be independent with HEP for improved balance, strength, gait for decreased fall risk.  TARGET for all STGS: 08/24/2019    Time  5    Period   Weeks    Status  Achieved      PT SHORT TERM GOAL #2   Title  Pt will improve 5x sit<>stand to less than or equal to 16 seconds for improved functional lower extremity strength.    Baseline  16.59 sec at best 08/16/2019    Time  5    Period  Weeks    Status  Partially Met  PT SHORT TERM GOAL #3   Title  Pt will improve TUG score to less than or equal to 18 seconds for decreased fall risk.    Baseline  TUG 14 sec 08/16/2019    Time  5    Period  Weeks    Status  Achieved      PT SHORT TERM GOAL #4   Title  Pt will improve DGI score to at least 16/24 for decreased fall risk.    Baseline  15/24 08/16/2019    Time  5    Period  Weeks    Status  Partially Met      PT SHORT TERM GOAL #5   Title  Pt will verbalize understanding of fall prevention in home environment.    Time  5    Period  Weeks    Status  Achieved        PT Long Term Goals - 07/23/19 1227      PT LONG TERM GOAL #1   Title  Pt will be independent with progression of HEP to address strength, balance, and gait for decreased fall risk.  TARGET 09/21/2019    Time  9    Period  Weeks    Status  New      PT LONG TERM GOAL #2   Title  Pt will improve 5x sit<>stand to less than or equal to 12.5 seconds for improved functional strength.    Time  9    Period  Weeks    Status  New      PT LONG TERM GOAL #3   Title  Pt will improve TUG score to less than or equal to 15 seconds for decreased fall risk.    Time  9    Period  Weeks    Status  New      PT LONG TERM GOAL #4   Title  Pt will improve DGI to at least 19/24 for decreased fall risk.    Time  9    Period  Weeks    Status  New      PT LONG TERM GOAL #5   Title  Pt will improve gait velocity to at least 2.3 ft/sec for improved gait efficiency and safety in community.    Time  9    Period  Weeks    Status  New            Plan - 09/12/19 1027    Clinical Impression Statement  Session today focused on gait with Darco shoes vs his current shoes  and balance activities.  Pt continues to have decreased balance reactions and LE edema impacting mobility.  Pt works hard during sessions with few rest breaks.  Continue PT per POC.    Personal Factors and Comorbidities  Comorbidity 3+    Comorbidities  DM, pacemaker, lymphedema in BLEs, FAbry's disease, diastolic heart failure, chronic kidney disease, heart failure, Bell's palsy.    Examination-Activity Limitations  Locomotion Level;Transfers    Examination-Participation Restrictions  Community Activity;Shop;Volunteer    Stability/Clinical Decision Making  Evolving/Moderate complexity    Rehab Potential  Good    PT Frequency  Other (comment)   1x/wk for 1 week, then 2x/wk for 8 weeks   PT Duration  Other (comment)   total POC = 9 weeks   PT Treatment/Interventions  ADLs/Self Care Home Management;Gait training;Stair training;Functional mobility training;Therapeutic activities;Therapeutic exercise;Balance training;Neuromuscular re-education;Patient/family education    PT Next Visit Plan  Ask pt if he  tried Darco/velcro shoes around the house. Continue SLS and hip strengthening, dynamic balance on compliant surfaces, review turns    Consulted and Agree with Plan of Care  Patient       Patient will benefit from skilled therapeutic intervention in order to improve the following deficits and impairments:  Abnormal gait, Difficulty walking, Decreased balance, Decreased mobility, Decreased strength  Visit Diagnosis: Unsteadiness on feet  Muscle weakness (generalized)  Other abnormalities of gait and mobility     Problem List Patient Active Problem List   Diagnosis Date Noted  . Morbid obesity ([place]) 08/23/2019  . 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 CVA (cerebral vascular accident) ([place]) 10/20/2018  . UTI (urinary tract infection) 10/19/2018  . Upper airway cough syndrome 08/01/2018  . Lymphedema 10/06/2016  . Gout, arthritis   . 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 Onychomycosis of  toenail 09/26/2014  . Diabetes mellitus type 2 in obese ([place])   . Iron deficiency anemia 07/29/2014  . CKD (chronic kidney disease), stage III ([place])   . Chronic diastolic HF (heart failure) ([place]) 03/27/2013  . Peripheral edema 07/20/2012  . Obstructive sleep apnea, suspected 06/21/2012  . Pacemaker-St.Jude 02/23/2012  . Hypertension 08/09/2011  . COPD with asthma ([place]) 08/09/2011  . Mobitz type II atrioventricular block 05/19/2011  . 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[place]) 05/19/2011    Narda Bonds, PTA [place] 09/12/19 10:29 AM Phone: [phone] Fax: [facility] [address] [place], [place], [address] Phone: [phone]   Fax:  [phone]  Name: [name] MRN: [id number] Date of Birth: 20-Jun-1960

## 2019-09-12 NOTE — Progress Notes (Signed)
JULEZ, HUSEBY ([id number]) Visit Report for 09/11/2019 HPI Details Patient Name: Date of Service: [name], [name] 09/11/2019 3:00 PM Medical Record [id number] Patient Account Number: [id number] Date of Birth/Sex: Treating RN: 04-Feb-1961 (59 y.o. [name] Primary Care Provider: Karle Plumber Other Clinician: Referring Provider: Treating Provider/Extender:Mars Scheaffer, [place] Scrape, Pecola Leisure in Treatment: 1 History of Present Illness Location: Patient presents with a wound to the left buttock area. Quality: Patient reports experiencing a dull pain to affected area(s). Severity: Mildly severe wound with no evidence of infection Duration: Patient has had the wound for   2 weeks prior to presenting for treatment Timing: Pain in wound is Intermittent (comes and goes) HPI Description: ADMISSION 11/28/2018 This is a pleasant 59 year old man who has chronic lymphedema since about 2010. The exact etiology of this is not really clear indeed if this is been worked up it is not really clear. In any case he also has type 2 diabetes. He was in hospital recently for a small brainstem CVA discharged in late May with bilateral Unna wraps on his legs. Is not clear that he had wounds in the hospital. Nevertheless he comes in today with the same Unna boots on his legs. He has compression stockings at home but they are old and he wanted to get remeasured. He went to [facility] however they said that they would need up-to-date measurements which I believe we can provide today. He also has compression pumps which he uses intermittently at home. I am not really sure who ordered these. He does not have a wound history but states that when he went in the hospital his legs were so big that his stockings did not fit at home Past medical history includes type 2 diabetes, lymphedema, bursitis in the left knee on 10/23/2018, hyper lipidemia, recent brainstem CVA leaving him with some neurologic  disability but much better, Febres disease, pacemaker and hypertension. The patient had venous reflux studies in June 2019. There was abnormal reflux times bilaterally in the proximal veins but it does not look like they were able to obtain reflux in his bilateral lower legs. I am not sure what the issue was here may have been too much edema to visualize the veins 09/04/19; READMISSION this is a now 59 year old man who is been in this clinic at least once before most recently in June of last year. He has lymphedema the exact etiology of which is unclear. He has been fairly consistent and saying this goes back about 12 years. Almost totally confined to below his knees he wears 20/30 below-knee stockings. He also has compression pumps at home that he is mostly compliant with. Apparently about 2 weeks ago his legs began to swell. He left his stockings on for a prolonged period of time, didn't take them off in the eventually rubbed wounds in his dorsal feet just distal to the crease of his ankles. He has been unable to get his stockings on since then and has not been using his compression pumps.it is not clear what is been using to the wounds perhaps Bactroban. His past medical history is reviewed and includes type 2 diabetes with a recent hemoglobin A1c of 9, hypertension, Fabry's disease, hyperlipidemia. Brainstem CVA diastolic heart failure, stage III chronic renal failure and lymphedema as noted. his legs are much too large to do ABIs on. Previously we have not felt he had an arterial issue 4/6; the areas in the crease of his ankles are better on both sides. Tissue looks  healthy we have been using silver alginate under compression he is using his compression pumps Electronic Signature(s) Signed: 09/11/2019 6:13:52 PM By: [name] MD Entered By: [name] on 09/11/2019 16:21:18 -------------------------------------------------------------------------------- Physical Exam  Details Patient Name: Date of Service: [name] 09/11/2019 3:00 PM Medical Record [id number] Patient Account Number: [id number] Date of Birth/Sex: Treating RN: 06-13-1960 (59 y.o. [name] Primary Care Provider: Karle Plumber Other Clinician: Referring Provider: Treating Provider/Extender:Deandrea Rion, [place] Scrape, Pecola Leisure in Treatment: 1 Constitutional Sitting or standing Blood Pressure is within target range for patient.. Pulse regular and within target range for patient.[name] Kitchen Respirations regular, non-labored and within target range.. Temperature is normal and within the target range for the patient.[name] Kitchen Appears in no distress. Respiratory work of breathing is normal. Cardiovascular He can feel his pedal pulses today which has been difficult to do previously because of edema. Lymphedema but much better edema control. Integumentary (Hair, Skin) No erythema around the wounds lymphedema in the lower extremities. Notes Wound exam; the patient has 2 horizontal fissured wounds both of these are smaller and tissue looks better. He has much better edema control in his legs. Electronic Signature(s) Signed: 09/11/2019 6:13:52 PM By: [name] MD Entered By: [name] on 09/11/2019 16:22:27 -------------------------------------------------------------------------------- Physician Orders Details Patient Name: Date of Service: [name] 09/11/2019 3:00 PM Medical Record [id number] Patient Account Number: [id number] Date of Birth/Sex: Treating RN: 06-07-1961 (59 y.o. Jerilynn Mages) Carlene Coria Primary Care Provider: Karle Plumber Other Clinician: Referring Provider: Treating Provider/Extender:Concepcion Kirkpatrick, Dayton Scrape, Pecola Leisure in Treatment: 1 Verbal / Phone Orders: No Diagnosis Coding ICD-10 Coding Code Description I89.0 Lymphedema, not elsewhere classified L97.512 Non-pressure chronic ulcer of other part of right foot with fat layer  exposed L97.522 Non-pressure chronic ulcer of other part of left foot with fat layer exposed Follow-up Appointments Return Appointment in 1 week. Dressing Change Frequency Do not change entire dressing for one week. Wound Cleansing May shower with protection. Primary Wound Dressing Wound #2 Left,Dorsal Foot Calcium Alginate with Silver Wound #3 Right,Dorsal Foot Calcium Alginate with Silver Secondary Dressing Wound #2 Left,Dorsal Foot Dry Gauze Wound #3 Right,Dorsal Foot Dry Gauze Edema Control Wound #2 Left,Dorsal Foot 3 Layer Compression System - Bilateral Wound #3 Right,Dorsal Foot 3 Layer Compression System - Bilateral Electronic Signature(s) Signed: 09/11/2019 6:13:52 PM By: [name] MD Signed: 09/11/2019 6:43:44 PM By: Carlene Coria RN Entered By: Carlene Coria on 09/11/2019 15:39:55 -------------------------------------------------------------------------------- Problem List Details Patient Name: Date of Service: [name] 09/11/2019 3:00 PM Medical Record [id number] Patient Account Number: [id number] Date of Birth/Sex: Treating RN: Aug 30, 1960 (58 y.o. [name] Primary Care Provider: Karle Plumber Other Clinician: Referring Provider: Treating Provider/Extender:Trine Fread, Dayton Scrape, Pecola Leisure in Treatment: 1 Active Problems ICD-10 Evaluated Encounter Code Description Active Date Today Diagnosis I89.0 Lymphedema, not elsewhere classified 09/04/2019 No Yes L97.512 Non-pressure chronic ulcer of other part of right foot 09/04/2019 No Yes with fat layer exposed L97.522 Non-pressure chronic ulcer of other part of left foot 09/04/2019 No Yes with fat layer exposed Inactive Problems Resolved Problems Electronic Signature(s) Signed: 09/11/2019 6:13:52 PM By: [name] MD Entered By: [name] on 09/11/2019 16:20:43 -------------------------------------------------------------------------------- Progress Note Details Patient  Name: Date of Service: [name] 09/11/2019 3:00 PM Medical Record [id number] Patient Account Number: [id number] Date of Birth/Sex: Treating RN: 1961/03/17 (58 y.o. [name] Primary Care Provider: Karle Plumber Other Clinician: Referring Provider: Treating Provider/Extender:Trenell Concannon, [place] Scrape, Pecola Leisure in Treatment: 1 Subjective History of Present Illness (HPI) The following HPI  elements were documented for the patient's wound: Location: Patient presents with a wound to the left buttock area. Quality: Patient reports experiencing a dull pain to affected area(s). Severity: Mildly severe wound with no evidence of infection Duration: Patient has had the wound for   2 weeks prior to presenting for treatment Timing: Pain in wound is Intermittent (comes and goes) ADMISSION 11/28/2018 This is a pleasant 58 year old man who has chronic lymphedema since about 2010. The exact etiology of this is not really clear indeed if this is been worked up it is not really clear. In any case he also has type 2 diabetes. He was in hospital recently for a small brainstem CVA discharged in late May with bilateral Unna wraps on his legs. Is not clear that he had wounds in the hospital. Nevertheless he comes in today with the same Unna boots on his legs. He has compression stockings at home but they are old and he wanted to get remeasured. He went to [facility] however they said that they would need up-to-date measurements which I believe we can provide today. He also has compression pumps which he uses intermittently at home. I am not really sure who ordered these. He does not have a wound history but states that when he went in the hospital his legs were so big that his stockings did not fit at home Past medical history includes type 2 diabetes, lymphedema, bursitis in the left knee on 10/23/2018, hyper lipidemia, recent brainstem CVA leaving him with some neurologic disability  but much better, Febres disease, pacemaker and hypertension. The patient had venous reflux studies in June 2019. There was abnormal reflux times bilaterally in the proximal veins but it does not look like they were able to obtain reflux in his bilateral lower legs. I am not sure what the issue was here may have been too much edema to visualize the veins 09/04/19; READMISSION this is a now 59 year old man who is been in this clinic at least once before most recently in June of last year. He has lymphedema the exact etiology of which is unclear. He has been fairly consistent and saying this goes back about 12 years. Almost totally confined to below his knees he wears 20/30 below-knee stockings. He also has compression pumps at home that he is mostly compliant with. Apparently about 2 weeks ago his legs began to swell. He left his stockings on for a prolonged period of time, didn't take them off in the eventually rubbed wounds in his dorsal feet just distal to the crease of his ankles. He has been unable to get his stockings on since then and has not been using his compression pumps.it is not clear what is been using to the wounds perhaps Bactroban. His past medical history is reviewed and includes type 2 diabetes with a recent hemoglobin A1c of 9, hypertension, Fabry's disease, hyperlipidemia. Brainstem CVA diastolic heart failure, stage III chronic renal failure and lymphedema as noted. his legs are much too large to do ABIs on. Previously we have not felt he had an arterial issue 4/6; the areas in the crease of his ankles are better on both sides. Tissue looks healthy we have been using silver alginate under compression he is using his compression pumps Objective Constitutional Sitting or standing Blood Pressure is within target range for patient.. Pulse regular and within target range for patient.[name] Kitchen Respirations regular, non-labored and within target range.. Temperature is normal and within the  target range for the patient.[name] Kitchen Appears  in no distress. Vitals Time Taken: 3:30 PM, Height: 70 in, Weight: 284 lbs, BMI: 40.7, Temperature: 97.9 F, Pulse: 58 bpm, Respiratory Rate: 18 breaths/min, Blood Pressure: 128/70 mmHg, Capillary Blood Glucose: 155 mg/dl. General Notes: glucose per pt report Respiratory work of breathing is normal. Cardiovascular He can feel his pedal pulses today which has been difficult to do previously because of edema. Lymphedema but much better edema control. General Notes: Wound exam; the patient has 2 horizontal fissured wounds both of these are smaller and tissue looks better. He has much better edema control in his legs. Integumentary (Hair, Skin) No erythema around the wounds lymphedema in the lower extremities. Wound #2 status is Open. Original cause of wound was Pressure Injury. The wound is located on the Left,Dorsal Foot. The wound measures 0.5cm length x 3.3cm width x 0.1cm depth; 1.296cm^2 area and 0.13cm^3 volume. There is Fat Layer (Subcutaneous Tissue) Exposed exposed. There is no tunneling or undermining noted. There is a small amount of serosanguineous drainage noted. The wound margin is distinct with the outline attached to the wound base. There is large (67-100%) red granulation within the wound bed. There is a small (1-33%) amount of necrotic tissue within the wound bed including Adherent Slough. Wound #3 status is Open. Original cause of wound was Pressure Injury. The wound is located on the Right,Dorsal Foot. The wound measures 0.7cm length x 1.6cm width x 0.1cm depth; 0.88cm^2 area and 0.088cm^3 volume. There is Fat Layer (Subcutaneous Tissue) Exposed exposed. There is no tunneling or undermining noted. There is a small amount of serosanguineous drainage noted. The wound margin is distinct with the outline attached to the wound base. There is large (67-100%) red, hyper - granulation within the wound bed. There is no necrotic tissue within  the wound bed. Assessment Active Problems ICD-10 Lymphedema, not elsewhere classified Non-pressure chronic ulcer of other part of right foot with fat layer exposed Non-pressure chronic ulcer of other part of left foot with fat layer exposed Procedures Wound #2 Pre-procedure diagnosis of Wound #2 is a Lymphedema located on the Left,Dorsal Foot . There was a Three Layer Compression Therapy Procedure by Carlene Coria, RN. Post procedure Diagnosis Wound #2: Same as Pre-Procedure Wound #3 Pre-procedure diagnosis of Wound #3 is a Lymphedema located on the Right,Dorsal Foot . There was a Three Layer Compression Therapy Procedure by Carlene Coria, RN. Post procedure Diagnosis Wound #3: Same as Pre-Procedure Plan Follow-up Appointments: Return Appointment in 1 week. Dressing Change Frequency: Do not change entire dressing for one week. Wound Cleansing: May shower with protection. Primary Wound Dressing: Wound #2 Left,Dorsal Foot: Calcium Alginate with Silver Wound #3 Right,Dorsal Foot: Calcium Alginate with Silver Secondary Dressing: Wound #2 Left,Dorsal Foot: Dry Gauze Wound #3 Right,Dorsal Foot: Dry Gauze Edema Control: Wound #2 Left,Dorsal Foot: 3 Layer Compression System - Bilateral Wound #3 Right,Dorsal Foot: 3 Layer Compression System - Bilateral 1. No change in the dressing which is silver alginate under 3-layer compression bilaterally 2. His wounds are better today. His edema control is good 3. I asked him to use his compression pumps for 1 hour twice a day and lieu of 2 hours once a day Electronic Signature(s) Signed: 09/11/2019 6:13:52 PM By: [name] MD Entered By: [name] on 09/11/2019 16:23:23 -------------------------------------------------------------------------------- SuperBill Details Patient Name: Date of Service: [name] 09/11/2019 Medical Record [id number] Patient Account Number: [id number] Date of Birth/Sex: Treating  RN: [birth year] (58 y.o. [name] Primary Care Provider: Karle Plumber Other Clinician: Referring Provider: Treating  Provider/Extender:Ozie Dimaria, [place] Scrape, Pecola Leisure in Treatment: 1 Diagnosis Coding ICD-10 Codes Code Description I89.0 Lymphedema, not elsewhere classified L97.512 Non-pressure chronic ulcer of other part of right foot with fat layer exposed L97.522 Non-pressure chronic ulcer of other part of left foot with fat layer exposed Facility Procedures CPT4: Code [id number] Description: 735 BILATERAL: Application of multi-layer venous compression system; leg (below knee), including ankle and foot. Modifier Quantity: 1 Physician Procedures CPT4 Code Description: [id number] - WC PHYS LEVEL 3 - EST PT ICD-10 Diagnosis Description I89.0 Lymphedema, not elsewhere classified L97.512 Non-pressure chronic ulcer of other part of right foot wit L97.522 Non-pressure chronic ulcer of other part of  left foot with Modifier: h fat layer e fat layer ex Quantity: 1 xposed posed Electronic Signature(s) Signed: 09/11/2019 6:13:52 PM By: [name] MD Entered By: [name] on 09/11/2019 16:23:44

## 2019-09-13 ENCOUNTER — Ambulatory Visit: Payer: Medicare HMO | Attending: Internal Medicine

## 2019-09-13 DIAGNOSIS — Z23 Encounter for immunization: Secondary | ICD-10-CM

## 2019-09-13 NOTE — Progress Notes (Signed)
   Covid-19 Vaccination Clinic  Name:  [name]    MRN: [id number] DOB: 02-25-1961  09/13/2019  [name] was observed post Covid-19 immunization for 30 minutes based on pre-vaccination screening without incident. He was provided with Vaccine Information Sheet and instruction to access the V-Safe system.   [name] was instructed to call 911 with any severe reactions post vaccine: [name] Kitchen Difficulty breathing  . Swelling of face and throat  . A fast heartbeat  . A bad rash all over body  . Dizziness and weakness   Immunizations Administered    Name Date Dose VIS Date Route   Pfizer COVID-19 Vaccine 09/13/2019 12:45 PM 0.3 mL 05/18/2019 Intramuscular   Manufacturer: [place]   Lot: [id number]   [place]: [id number]

## 2019-09-17 NOTE — Progress Notes (Signed)
[name] ([id number]) Visit Report for 09/11/2019 Arrival Information Details Patient Name: Date of Service: [name], [name] 09/11/2019 3:00 PM Medical Record [id number] Patient Account Number: [id number] Date of Birth/Sex: Treating RN: August 14, 1960 (59 y.o. [name] Primary Care Chadrick Sprinkle: Karle Plumber Other Clinician: Referring Baird Polinski: Treating Kenyanna Grzesiak/Extender:Robson, [place] Scrape, Pecola Leisure in Treatment: 1 Visit Information History Since Last Visit Added or deleted any medications: No Patient Arrived: [name] Any new allergies or adverse reactions: No Arrival Time: 15:27 Had a fall or experienced change in No Accompanied By: alone activities of daily living that may affect Transfer Assistance: None risk of falls: Patient Identification Verified: Yes Signs or symptoms of abuse/neglect since last No Secondary Verification Process Completed: Yes visito Hospitalized since last visit: No Implantable device outside of the clinic excluding No cellular tissue based products placed in the center since last visit: Has Dressing in Place as Prescribed: Yes Pain Present Now: No Electronic Signature(s) Signed: 09/17/2019 6:12:10 PM By: Levan Hurst RN, BSN Entered By: Levan Hurst on 09/11/2019 15:27:58 -------------------------------------------------------------------------------- Compression Therapy Details Patient Name: Date of Service: [name] Sells D. 09/11/2019 3:00 PM Medical Record [id number] Patient Account Number: [id number] Date of Birth/Sex: Treating RN: 1960-10-27 (58 y.o. [name] Primary Care Mattalynn Crandle: Karle Plumber Other Clinician: Referring Raed Schalk: Treating Rmoni Keplinger/Extender:Robson, [place] Scrape, Pecola Leisure in Treatment: 1 Compression Therapy Performed for Wound Wound #2 Left,Dorsal Foot Assessment: Performed By: Jake Church, RN Compression Type: Three Layer Post Procedure Diagnosis Same  as Pre-procedure Electronic Signature(s) Signed: 09/11/2019 6:43:44 PM By: Carlene Coria RN Entered By: Carlene Coria on 09/11/2019 15:57:05 -------------------------------------------------------------------------------- Compression Therapy Details Patient Name: Date of Service: [name] 09/11/2019 3:00 PM Medical Record [id number] Patient Account Number: [id number] Date of Birth/Sex: Treating RN: 01/22/1961 (58 y.o. [name] Primary Care Rasul Decola: Karle Plumber Other Clinician: Referring Jayleene Glaeser: Treating Arnie Maiolo/Extender:Robson, [place] Scrape, Pecola Leisure in Treatment: 1 Compression Therapy Performed for Wound Wound #3 Right,Dorsal Foot Assessment: Performed By: Jake Church, RN Compression Type: Three Layer Post Procedure Diagnosis Same as Pre-procedure Electronic Signature(s) Signed: 09/11/2019 6:43:44 PM By: Carlene Coria RN Entered By: Carlene Coria on 09/11/2019 15:57:05 -------------------------------------------------------------------------------- Encounter Discharge Information Details Patient Name: Date of Service: [name] Sells D. 09/11/2019 3:00 PM Medical Record [id number] Patient Account Number: [id number] Date of Birth/Sex: Treating RN: Oct 20, 1960 (59 y.o. [name] Primary Care Shahab Polhamus: Karle Plumber Other Clinician: Referring Patty Lopezgarcia: Treating Lilah Mijangos/Extender:Robson, [place] Scrape, Pecola Leisure in Treatment: 1 Encounter Discharge Information Items Discharge Condition: Stable Ambulatory Status: Cane Discharge Destination: Home Transportation: Private Auto Accompanied By: self Schedule Follow-up Appointment: Yes Clinical Summary of Care: Patient Declined Electronic Signature(s) Signed: 09/11/2019 6:07:04 PM By: Kela Millin Entered By: Kela Millin on 09/11/2019 16:14:28 -------------------------------------------------------------------------------- Lower Extremity Assessment  Details Patient Name: Date of Service: [name] 09/11/2019 3:00 PM Medical Record [id number] Patient Account Number: [id number] Date of Birth/Sex: Treating RN: 01-Jan-1961 (59 y.o. [name] Primary Care Eyana Stolze: Karle Plumber Other Clinician: Referring Sherral Dirocco: Treating Jeralyn Nolden/Extender:Robson, [place] Scrape, Pecola Leisure in Treatment: 1 Edema Assessment Assessed: [Left: No] [Right: No] Edema: [Left: Yes] [Right: Yes] Calf Left: Right: Point of Measurement: cm From Medial Instep 50 cm 47.2 cm Ankle Left: Right: Point of Measurement: cm From Medial Instep 45.8 cm 44.5 cm Vascular Assessment Pulses: Dorsalis Pedis Palpable: [Left:Yes] [Right:Yes] Electronic Signature(s) Signed: 09/17/2019 6:12:10 PM By: Levan Hurst RN, BSN Entered By: Levan Hurst on 09/11/2019 15:33:59 -------------------------------------------------------------------------------- Multi Wound Chart Details Patient Name: Date of Service: [name] Sells D. 09/11/2019 3:00 PM Medical  Record [id number] Patient Account Number: [id number] Date of Birth/Sex: Treating RN: 1961/05/02 (58 y.o. Jerilynn Mages) Carlene Coria Primary Care Mollyann Halbert: Karle Plumber Other Clinician: Referring Soraya Paquette: Treating Chesky Heyer/Extender:Robson, [place] Scrape, Pecola Leisure in Treatment: 1 Vital Signs Height(in): 70 Capillary Blood 155 Glucose(mg/dl): Weight(lbs): 284 Pulse(bpm): 27 Body Mass Index(BMI): 20 Blood Pressure(mmHg): 128/70 Temperature(F): 97.9 Respiratory 18 Rate(breaths/min): Photos: [2:No Photos] [3:No Photos] [N/A:N/A] Wound Location: [2:Left, Dorsal Foot] [3:Right, Dorsal Foot] [N/A:N/A] Wounding Event: [2:Pressure Injury] [3:Pressure Injury] [N/A:N/A] Primary Etiology: [2:Lymphedema] [3:Lymphedema] [N/A:N/A] Secondary Etiology: [2: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/A Lower Extremity] [3:Lower Extremity] Comorbid History: [2:Lymphedema, Arrhythmia,  Lymphedema, Arrhythmia, N/A Congestive Heart Failure, Congestive Heart Failure, Hypertension, Type II Diabetes, End Stage Renal Diabetes, End Stage Renal Disease, Gout] [3:Hypertension, Type II Disease, Gout] Date Acquired: [2:07/30/2019] [3:07/30/2019] [N/A:N/A] Weeks of Treatment: [2:1] [3:1] [N/A:N/A] Wound Status: [2:Open] [3:Open] [N/A:N/A] Measurements L x W x D 0.5x3.3x0.1 [3:0.7x1.6x0.1] [N/A:N/A] (cm) Area (cm) : [2:1.296] [3:0.88] [N/A:N/A] Volume (cm) : [2:0.13] [3:0.088] [N/A:N/A] % Reduction in Area: [2:51.70%] [3:49.50%] [N/A:N/A] % Reduction in Volume: 83.90% [3:74.80%] [N/A:N/A] Classification: [2:Full Thickness Without Exposed Support Structures Exposed Support Structures] [3:Full Thickness Without] [N/A:N/A] Exudate Amount: [2:Small] [3:Small] [N/A:N/A] Exudate Type: [2:Serosanguineous] [3:Serosanguineous] [N/A:N/A] Exudate Color: [2:red, brown] [3:red, brown] [N/A:N/A] Wound Margin: [2:Distinct, outline attached Distinct, outline attached N/A] Granulation Amount: [2:Large (67-100%)] [3:Large (67-100%)] [N/A:N/A] Granulation Quality: [2:Red] [3:Red, Hyper-granulation] [N/A:N/A] Necrotic Amount: [2:Small (1-33%)] [3:None Present (0%)] [N/A:N/A] Exposed Structures: [2:Fat Layer (Subcutaneous Fat Layer (Subcutaneous N/A Tissue) Exposed: Yes Fascia: No Tendon: No Muscle: No Joint: No Bone: No] [3:Tissue) Exposed: Yes Fascia: No Tendon: No Muscle: No Joint: No Bone: No] Epithelialization: [2:Medium (34-66%)] [3:Medium (34-66%) Compression Therapy] [N/A:N/A N/A] Treatment Notes Wound #2 (Left, Dorsal Foot) 1. Cleanse With Wound Cleanser Soap and water 2. Periwound Care Moisturizing lotion 3. Primary Dressing Applied Calcium Alginate Ag 4. Secondary Dressing Dry Gauze Foam 6. Support Layer Applied 3 layer compression wrap Wound #3 (Right, Dorsal Foot) 1. Cleanse With Wound Cleanser Soap and water 2. Periwound Care Moisturizing lotion 3. Primary Dressing  Applied Calcium Alginate Ag 4. Secondary Dressing Dry Gauze Foam 6. Support Layer Applied 3 layer compression Water quality scientist) Signed: 09/11/2019 6:13:52 PM By: [name] MD Signed: 09/11/2019 6:43:44 PM By: Carlene Coria RN Entered By: [name] on 09/11/2019 16:20:48 -------------------------------------------------------------------------------- [place] Details Patient Name: Date of Service: [name] Sells D. 09/11/2019 3:00 PM Medical Record [id number] Patient Account Number: [id number] Date of Birth/Sex: Treating RN: 1961-01-30 (58 y.o. Jerilynn Mages) Carlene Coria Primary Care Yexalen Deike: Karle Plumber Other Clinician: Referring Isaih Bulger: Treating Vaeda Westall/Extender:Robson, [place] Scrape, Pecola Leisure in Treatment: 1 Active Inactive Abuse / Safety / Falls / Self Care Management Nursing Diagnoses: Potential for injury related to falls Goals: Patient will not experience any injury related to falls Date Initiated: 09/05/2019 Target Resolution Date: 10/05/2019 Goal Status: Active Interventions: Assess Activities of Daily Living upon admission and as needed Assess fall risk on admission and as needed Assess: immobility, friction, shearing, incontinence upon admission and as needed Assess impairment of mobility on admission and as needed per policy Assess personal safety and home safety (as indicated) on admission and as needed Assess self care needs on admission and as needed Notes: Nutrition Nursing Diagnoses: Impaired glucose control: actual or potential Goals: Patient/caregiver verbalizes understanding of need to maintain therapeutic glucose control per primary care physician Date Initiated: 09/05/2019 Target Resolution Date: 10/05/2019 Goal Status: Active Interventions: Assess HgA1c results as ordered upon admission and as needed Assess patient nutrition upon admission  and as needed per policy Notes: Wound/Skin  Impairment Nursing Diagnoses: Knowledge deficit related to ulceration/compromised skin integrity Goals: Patient/caregiver will verbalize understanding of skin care regimen Date Initiated: 09/05/2019 Target Resolution Date: 10/05/2019 Goal Status: Active Ulcer/skin breakdown will have a volume reduction of 30% by week 4 Date Initiated: 09/05/2019 Target Resolution Date: 10/05/2019 Goal Status: Active Interventions: Assess patient/caregiver ability to obtain necessary supplies Assess patient/caregiver ability to perform ulcer/skin care regimen upon admission and as needed Assess ulceration(s) every visit Notes: Electronic Signature(s) Signed: 09/11/2019 6:43:44 PM By: Carlene Coria RN Entered By: Carlene Coria on 09/11/2019 15:40:08 -------------------------------------------------------------------------------- Pain Assessment Details Patient Name: Date of Service: RASHIED, [name] 09/11/2019 3:00 PM Medical Record [id number] Patient Account Number: [id number] Date of Birth/Sex: Treating RN: [birth year] (60 y.o. [name] Primary Care Jarome Trull: Karle Plumber Other Clinician: Referring Kevron Patella: Treating Semir Brill/Extender:Robson, [place] Scrape, Pecola Leisure in Treatment: 1 Active Problems Location of Pain Severity and Description of Pain Patient Has Paino No Site Locations Pain Management and Medication Current Pain Management: Electronic Signature(s) Signed: 09/17/2019 6:12:10 PM By: Levan Hurst RN, BSN Entered By: Levan Hurst on 09/11/2019 15:30:43 -------------------------------------------------------------------------------- Patient/Caregiver Education Details Patient Name: Date of Service: [name] 4/6/2021andnbsp3:00 PM Medical Record [id number] Patient Account Number: [id number] Date of Birth/Gender: Treating RN: 1960/08/03 (58 y.o. [name] Primary Care Physician: Karle Plumber Other Clinician: Referring Physician:  Treating Physician/Extender:Robson, [place] Scrape, Pecola Leisure in Treatment: 1 Education Assessment Education Provided To: Patient Education Topics Provided Wound/Skin Impairment: Methods: Explain/Verbal Responses: State content correctly Electronic Signature(s) Signed: 09/11/2019 6:43:44 PM By: Carlene Coria RN Entered By: Carlene Coria on 09/11/2019 15:49:07 -------------------------------------------------------------------------------- Wound Assessment Details Patient Name: Date of Service: [name], [name] 09/11/2019 3:00 PM Medical Record [id number] Patient Account Number: [id number] Date of Birth/Sex: Treating RN: Oct 13, 1960 (59 y.o. [name] Primary Care Laurence Folz: Karle Plumber Other Clinician: Referring Travonne Schowalter: Treating Faria Casella/Extender:Robson, [place] Scrape, Pecola Leisure in Treatment: 1 Wound Status Wound Number: 2 Primary Lymphedema Etiology: Wound Location: Left, Dorsal Foot Secondary Diabetic Wound/Ulcer of the Lower Extremity Wounding Event: Pressure Injury Etiology: Date Acquired: 07/30/2019 Wound Open Weeks Of Treatment: 1 Status: Clustered Wound: No Comorbid Lymphedema, Arrhythmia, Congestive Heart History: Failure, Hypertension, Type II Diabetes, End Stage Renal Disease, Gout Photos Photo Uploaded By: Mikeal Hawthorne on 09/14/2019 10:42:02 Wound Measurements Length: (cm) 0.5 % Reduct Width: (cm) 3.3 % Reduct Depth: (cm) 0.1 Epitheli Area: (cm) 1.296 Tunneli Volume: (cm) 0.13 Undermi Wound Description Classification: Full Thickness Without Exposed Support Foul Odo Structures Slough/F Wound Distinct, outline attached Margin: Exudate Small Amount: Exudate Serosanguineous Type: Exudate red, brown Color: Wound Bed Granulation Amount: Large (67-100%) Granulation Quality: Red Fascia Expo Necrotic Amount: Small (1-33%) Fat Layer ( Necrotic Quality: Adherent Slough Tendon Expo Muscle Expo Joint Expos Bone Expose r  After Cleansing: No ibrino Yes Exposed Structure sed: No Subcutaneous Tissue) Exposed: Yes sed: No sed: No ed: No d: No ion in Area: 51.7% ion in Volume: 83.9% alization: Medium (34-66%) ng: No ning: No Treatment Notes Wound #2 (Left, Dorsal Foot) 1. Cleanse With Wound Cleanser Soap and water 2. Periwound Care Moisturizing lotion 3. Primary Dressing Applied Calcium Alginate Ag 4. Secondary Dressing Dry Gauze Foam 6. Support Layer Applied 3 layer compression Water quality scientist) Signed: 09/17/2019 6:12:10 PM By: Levan Hurst RN, BSN Entered By: Levan Hurst on 09/11/2019 15:41:53 -------------------------------------------------------------------------------- Wound Assessment Details Patient Name: Date of Service: [name] Sells D. 09/11/2019 3:00 PM Medical Record [id number] Patient Account Number: [id number] Date of Birth/Sex: Treating RN: 1961-02-10 (58  y.o. Jerilynn Mages) Donnal Debar, Briant Cedar Primary Care Leotha Westermeyer: Karle Plumber Other Clinician: Referring Dhruv Christina: Treating Leiland Mihelich/Extender:Robson, [place] Scrape, Pecola Leisure in Treatment: 1 Wound Status Wound Number: 3 Primary Lymphedema Etiology: Wound Location: Right, Dorsal Foot Secondary Diabetic Wound/Ulcer of the Lower Extremity Wounding Event: Pressure Injury Etiology: Date Acquired: 07/30/2019 Wound Open Weeks Of Treatment: 1 Status: Clustered Wound: No Comorbid Lymphedema, Arrhythmia, Congestive Heart History: Failure, Hypertension, Type II Diabetes, End Stage Renal Disease, Gout Photos Photo Uploaded By: Mikeal Hawthorne on 09/14/2019 10:42:05 Wound Measurements Length: (cm) 0.7 % Reduct Width: (cm) 1.6 % Reduct Depth: (cm) 0.1 Epitheli Area: (cm) 0.88 Tunneli Volume: (cm) 0.088 Undermi Wound Description Full Thickness Without Exposed Support Foul Odo Classification: Structures Slough/F Wound Distinct, outline attached Margin: Exudate Small Amount: Exudate  Serosanguineous Type: Exudate red, brown Color: Wound Bed Granulation Amount: Large (67-100%) Granulation Quality: Red, Hyper-granulation Fascia E Necrotic Amount: None Present (0%) Fat Laye Tendon E Muscle E Joint Ex Bone Exp r After Cleansing: No ibrino No Exposed Structure xposed: No r (Subcutaneous Tissue) Exposed: Yes xposed: No xposed: No posed: No osed: No ion in Area: 49.5% ion in Volume: 74.8% alization: Medium (34-66%) ng: No ning: No Treatment Notes Wound #3 (Right, Dorsal Foot) 1. Cleanse With Wound Cleanser Soap and water 2. Periwound Care Moisturizing lotion 3. Primary Dressing Applied Calcium Alginate Ag 4. Secondary Dressing Dry Gauze Foam 6. Support Layer Applied 3 layer compression Water quality scientist) Signed: 09/17/2019 6:12:10 PM By: Levan Hurst RN, BSN Entered By: Levan Hurst on 09/11/2019 15:42:06 -------------------------------------------------------------------------------- [place] Details Patient Name: Date of Service: [name] Sells D. 09/11/2019 3:00 PM Medical Record [id number] Patient Account Number: [id number] Date of Birth/Sex: Treating RN: 02-22-1961 (59 y.o. [name] Primary Care Craigory Toste: Karle Plumber Other Clinician: Referring Ericha Whittingham: Treating Saxon Barich/Extender:Robson, [place] Scrape, Pecola Leisure in Treatment: 1 Vital Signs Time Taken: 15:30 Temperature (F): 97.9 Height (in): 70 Pulse (bpm): 58 Weight (lbs): 284 Respiratory Rate (breaths/min): 18 Body Mass Index (BMI): 40.7 Blood Pressure (mmHg): 128/70 Capillary Blood Glucose (mg/dl): 155 Reference Range: 80 - 120 mg / dl Notes glucose per pt report Electronic Signature(s) Signed: 09/17/2019 6:12:10 PM By: Levan Hurst RN, BSN Entered By: Levan Hurst on 09/11/2019 15:30:37

## 2019-09-18 ENCOUNTER — Other Ambulatory Visit: Payer: Self-pay

## 2019-09-18 ENCOUNTER — Ambulatory Visit: Payer: Medicare HMO | Admitting: Physical Therapy

## 2019-09-18 ENCOUNTER — Encounter (HOSPITAL_BASED_OUTPATIENT_CLINIC_OR_DEPARTMENT_OTHER): Payer: Medicare HMO | Admitting: Internal Medicine

## 2019-09-18 ENCOUNTER — Encounter: Payer: Self-pay | Admitting: Physical Therapy

## 2019-09-18 DIAGNOSIS — R2681 Unsteadiness on feet: Secondary | ICD-10-CM | POA: Diagnosis not present

## 2019-09-18 DIAGNOSIS — L97522 Non-pressure chronic ulcer of other part of left foot with fat layer exposed: Secondary | ICD-10-CM | POA: Diagnosis not present

## 2019-09-18 DIAGNOSIS — I132 Hypertensive heart and chronic kidney disease with heart failure and with stage 5 chronic kidney disease, or end stage renal disease: Secondary | ICD-10-CM | POA: Diagnosis not present

## 2019-09-18 DIAGNOSIS — E785 Hyperlipidemia, unspecified: Secondary | ICD-10-CM | POA: Diagnosis not present

## 2019-09-18 DIAGNOSIS — N183 Chronic kidney disease, stage 3 unspecified: Secondary | ICD-10-CM | POA: Diagnosis not present

## 2019-09-18 DIAGNOSIS — E1122 Type 2 diabetes mellitus with diabetic chronic kidney disease: Secondary | ICD-10-CM | POA: Diagnosis not present

## 2019-09-18 DIAGNOSIS — I890 Lymphedema, not elsewhere classified: Secondary | ICD-10-CM | POA: Diagnosis not present

## 2019-09-18 DIAGNOSIS — E11621 Type 2 diabetes mellitus with foot ulcer: Secondary | ICD-10-CM | POA: Diagnosis not present

## 2019-09-18 DIAGNOSIS — Z8673 Personal history of transient ischemic attack (TIA), and cerebral infarction without residual deficits: Secondary | ICD-10-CM | POA: Diagnosis not present

## 2019-09-18 DIAGNOSIS — R2689 Other abnormalities of gait and mobility: Secondary | ICD-10-CM | POA: Diagnosis not present

## 2019-09-18 DIAGNOSIS — I5032 Chronic diastolic (congestive) heart failure: Secondary | ICD-10-CM | POA: Diagnosis not present

## 2019-09-18 DIAGNOSIS — L97512 Non-pressure chronic ulcer of other part of right foot with fat layer exposed: Secondary | ICD-10-CM | POA: Diagnosis not present

## 2019-09-18 DIAGNOSIS — M6281 Muscle weakness (generalized): Secondary | ICD-10-CM | POA: Diagnosis not present

## 2019-09-18 NOTE — Progress Notes (Signed)
[name] ([id number]) Visit Report for 09/18/2019 Arrival Information Details Patient Name: Date of Service: [name], [name] 09/18/2019 8:00 AM Medical Record Number:[id number] Patient Account Number: [id number] Date of Birth/Sex: Treating RN: 07-15-1960 (59 y.o. Ernestene Mention Primary Care Jamilya Sarrazin: Karle Plumber Other Clinician: Referring Dionel Archey: Treating Jasten Guyette/Extender:Robson, [place] Scrape, Pecola Leisure in Treatment: 2 Visit Information History Since Last Visit Cane Added or deleted any medications: No Patient Arrived: 07:42 Any new allergies or adverse reactions: No Arrival Time: Had a fall or experienced change in No Accompanied By: self None activities of daily living that may affect Transfer Assistance: risk of falls: Patient Identification Verified: Yes Signs or symptoms of abuse/neglect since last No Secondary Verification Process Completed: Yes visito Patient Requires Transmission-Based No Hospitalized since last visit: No Precautions: Implantable device outside of the clinic excluding No Patient Has Alerts: No cellular tissue based products placed in the center since last visit: Has Dressing in Place as Prescribed: Yes Has Compression in Place as Prescribed: Yes Pain Present Now: No Electronic Signature(s) Signed: 09/18/2019 6:08:25 PM By: Baruch Gouty RN, BSN Entered By: Baruch Gouty on 09/18/2019 07:44:39 -------------------------------------------------------------------------------- Clinic Level of Care Assessment Details Patient Name: Date of Service: [name] 09/18/2019 8:00 AM Medical Record [id number] Patient Account Number: [id number] Date of Birth/Sex: Treating RN: 05-29-1961 (59 y.o. Lorette Ang, Meta.Reding Primary Care Hamid Brookens: Karle Plumber Other Clinician: Referring Kurtiss Wence: Treating Kaarin Pardy/Extender:Robson, [place] Scrape, Pecola Leisure in Treatment: 2 Clinic Level of Care Assessment Items TOOL  4 Quantity Score X - Use when only an EandM is performed on FOLLOW-UP visit 1 0 ASSESSMENTS - Nursing Assessment / Reassessment X - Reassessment of Co-morbidities (includes updates in patient status) 1 10 X - Reassessment of Adherence to Treatment Plan 1 5 ASSESSMENTS - Wound and Skin Assessment / Reassessment []  - Simple Wound Assessment / Reassessment - one wound 0 X - Complex Wound Assessment / Reassessment - multiple wounds 2 5 X - Dermatologic / Skin Assessment (not related to wound area) 1 10 ASSESSMENTS - Focused Assessment X - Circumferential Edema Measurements - multi extremities 2 5 X - Nutritional Assessment / Counseling / Intervention 1 10 []  - Lower Extremity Assessment (monofilament, tuning fork, pulses) 0 []  - Peripheral Arterial Disease Assessment (using hand held doppler) 0 ASSESSMENTS - Ostomy and/or Continence Assessment and Care []  - Incontinence Assessment and Management 0 []  - Ostomy Care Assessment and Management (repouching, etc.) 0 PROCESS - Coordination of Care []  - Simple Patient / Family Education for ongoing care 0 X - Complex (extensive) Patient / Family Education for ongoing care 1 20 X - Staff obtains Programmer, systems, Records, Test Results / Process Orders 1 10 []  - Staff telephones HHA, Nursing Homes / Clarify orders / etc 0 []  - Routine Transfer to another Facility (non-emergent condition) 0 []  - Routine Hospital Admission (non-emergent condition) 0 []  - New Admissions / Biomedical engineer / Ordering NPWT, Apligraf, etc. 0 []  - Emergency Hospital Admission (emergent condition) 0 []  - Simple Discharge Coordination 0 X - Complex (extensive) Discharge Coordination 1 15 PROCESS - Special Needs []  - Pediatric / Minor Patient Management 0 []  - Isolation Patient Management 0 []  - Hearing / Language / Visual special needs 0 []  - Assessment of Community assistance (transportation, D/C planning, etc.) 0 []  - Additional assistance / Altered mentation 0 []  -  Support Surface(s) Assessment (bed, cushion, seat, etc.) 0 INTERVENTIONS - Wound Cleansing / Measurement []  - Simple Wound Cleansing - one wound 0 X - Complex Wound  Cleansing - multiple wounds 2 5 X - Wound Imaging (photographs - any number of wounds) 1 5 []  - Wound Tracing (instead of photographs) 0 []  - Simple Wound Measurement - one wound 0 X - Complex Wound Measurement - multiple wounds 2 5 INTERVENTIONS - Wound Dressings []  - Small Wound Dressing one or multiple wounds 0 []  - Medium Wound Dressing one or multiple wounds 0 []  - Large Wound Dressing one or multiple wounds 0 []  - Application of Medications - topical 0 []  - Application of Medications - injection 0 INTERVENTIONS - Miscellaneous []  - External ear exam 0 []  - Specimen Collection (cultures, biopsies, blood, body fluids, etc.) 0 []  - Specimen(s) / Culture(s) sent or taken to Lab for analysis 0 []  - Patient Transfer (multiple staff / Civil Service fast streamer / Similar devices) 0 []  - Simple Staple / Suture removal (25 or less) 0 []  - Complex Staple / Suture removal (26 or more) 0 []  - Hypo / Hyperglycemic Management (close monitor of Blood Glucose) 0 []  - Ankle / Brachial Index (ABI) - do not check if billed separately 0 X - Vital Signs 1 5 Has the patient been seen at the hospital within the last three years: Yes Total Score: 130 Level Of Care: New/Established - Level 4 Electronic Signature(s) Signed: 09/18/2019 6:03:05 PM By: Deon Pilling Entered By: Deon Pilling on 09/18/2019 08:19:32 -------------------------------------------------------------------------------- Encounter Discharge Information Details Patient Name: Date of Service: [name] Sells D. 09/18/2019 8:00 AM Medical Record [id number] Patient Account Number: [id number] Date of Birth/Sex: Treating RN: 10-11-1960 (59 y.o. Hessie Diener Primary Care Karmella Bouvier: Karle Plumber Other Clinician: Referring Kynslei Art: Treating Baileigh Modisette/Extender:Robson,  [place] Scrape, Pecola Leisure in Treatment: 2 Encounter Discharge Information Items Discharge Condition: Stable Ambulatory Status: Ambulatory Discharge Destination: Home Transportation: Private Auto Accompanied By: self Schedule Follow-up Appointment: No Clinical Summary of Care: Electronic Signature(s) Signed: 09/18/2019 6:03:05 PM By: Deon Pilling Entered By: Deon Pilling on 09/18/2019 08:21:05 -------------------------------------------------------------------------------- Lower Extremity Assessment Details Patient Name: Date of Service: DEMAR, SHAD 09/18/2019 8:00 AM Medical Record [id number] Patient Account Number: [id number] Date of Birth/Sex: Treating RN: 08-12-1960 (59 y.o. Ernestene Mention Primary Care Ridhima Golberg: Karle Plumber Other Clinician: Referring Rylyn Zawistowski: Treating Tri Chittick/Extender:Robson, [place] Scrape, Pecola Leisure in Treatment: 2 Edema Assessment Assessed: [Left: No] [Right: No] Edema: [Left: Yes] [Right: Yes] Calf Left: Right: Point of Measurement: cm From Medial Instep 52.3 cm 48 cm Ankle Left: Right: Point of Measurement: cm From Medial Instep 46.8 cm 45.3 cm Vascular Assessment Pulses: Dorsalis Pedis Palpable: [Left:No] [Right:No] Electronic Signature(s) Signed: 09/18/2019 6:08:25 PM By: Baruch Gouty RN, BSN Entered By: Baruch Gouty on 09/18/2019 08:01:38 -------------------------------------------------------------------------------- Multi Wound Chart Details Patient Name: Date of Service: [name] Sells D. 09/18/2019 8:00 AM Medical Record [id number] Patient Account Number: [id number] Date of Birth/Sex: Treating RN: 12/28/1960 (59 y.o. Hessie Diener Primary Care Courtany Mcmurphy: Karle Plumber Other Clinician: Referring Adayah Arocho: Treating Danette Weinfeld/Extender:Robson, [place] Scrape, Pecola Leisure in Treatment: 2 Vital Signs Height(in): 70 Capillary Blood 211 Glucose(mg/dl): Weight(lbs): 284 Pulse(bpm):  23 Body Mass Index(BMI): 42 Blood Pressure(mmHg): 124/63 Temperature(F): 98.6 Respiratory 20 Rate(breaths/min): Photos: [2:No Photos] [3:No Photos] [N/A:N/A] Wound Location: [2:Left, Dorsal Foot] [3:Right, Dorsal Foot] [N/A:N/A] Wounding Event: [2:Pressure Injury] [3:Pressure Injury] [N/A:N/A] Primary Etiology: [2:Lymphedema] [3:Lymphedema] [N/A:N/A] Secondary Etiology: [2: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/A Lower Extremity] [3:Lower Extremity] Comorbid History: [2:Lymphedema, Arrhythmia, Lymphedema, Arrhythmia, N/A Congestive Heart Failure, Congestive Heart Failure, Hypertension, Type II Diabetes, End Stage Renal Diabetes, End Stage Renal Disease, Gout] [3:Hypertension, Type II Disease, Gout]  Date Acquired: [2:07/30/2019] [3:07/30/2019] [N/A:N/A] Weeks of Treatment: [2:2] [3:2] [N/A:N/A] Wound Status: [2:Open] [3:Open] [N/A:N/A] Measurements L x W x D 0x0x0 [3:0x0x0] [N/A:N/A] (cm) Area (cm) : [2:0] [3:0] [N/A:N/A] Volume (cm) : [2:0] [3:0] [N/A:N/A] % Reduction in Area: [2:100.00%] [3:100.00%] [N/A:N/A] % Reduction in Volume: 100.00% [3:100.00%] [N/A:N/A] Classification: [2:Full Thickness Without Exposed Support Structures Exposed Support Structures] [3:Full Thickness Without] [N/A:N/A] Exudate Amount: [2:Small] [3:None Present] [N/A:N/A] Exudate Type: [2:Serous] [3:N/A] [N/A:N/A] Exudate Color: [2:amber] [3:N/A] [N/A:N/A] Wound Margin: [2:Distinct, outline attached Distinct, outline attached N/A] Granulation Amount: [2:None Present (0%)] [3:None Present (0%)] [N/A:N/A] Necrotic Amount: [2:None Present (0%)] [3:None Present (0%)] [N/A:N/A] Exposed Structures: [2:Fascia: No Fat Layer (Subcutaneous Tissue) Exposed: No Tendon: No Muscle: No Joint: No Bone: No Large (67-100%)] [3:Fascia: No Fat Layer (Subcutaneous Tissue) Exposed: No Tendon: No Muscle: No Joint: No Bone: No Large (67-100%)] [N/A:N/A N/A] Treatment Notes Electronic Signature(s) Signed:  09/18/2019 5:51:00 PM By: [name] MD Signed: 09/18/2019 6:03:05 PM By: Deon Pilling Entered By: [name] on 09/18/2019 08:20:10 -------------------------------------------------------------------------------- Multi-Disciplinary Care Plan Details Patient Name: Date of Service: [name] Sells D. 09/18/2019 8:00 AM Medical Record [id number] Patient Account Number: [id number] Date of Birth/Sex: Treating RN: 04/13/1961 (59 y.o. Hessie Diener Primary Care Takiah Maiden: Karle Plumber Other Clinician: Referring Monna Crean: Treating Milen Lengacher/Extender:Robson, [place] Scrape, Pecola Leisure in Treatment: 2 Active Inactive Electronic Signature(s) Signed: 09/18/2019 6:03:05 PM By: Deon Pilling Entered By: Deon Pilling on 09/18/2019 08:08:56 -------------------------------------------------------------------------------- Patient/Caregiver Education Details Patient Name: Date of Service: [name] 4/13/2021andnbsp8:00 AM Medical Record [id number] Patient Account Number: [id number] Date of Birth/Gender: Treating RN: Sep 03, 1960 (59 y.o. Hessie Diener Primary Care Physician: Karle Plumber Other Clinician: Referring Physician: Treating Physician/Extender:Robson, [place] Scrape, Pecola Leisure in Treatment: 2 Education Assessment Education Provided To: Patient Education Topics Provided Wound/Skin Impairment: Handouts: Skin Care Do's and Dont's Methods: Explain/Verbal Responses: Reinforcements needed Electronic Signature(s) Signed: 09/18/2019 6:03:05 PM By: Deon Pilling Entered By: Deon Pilling on 09/18/2019 07:47:33 -------------------------------------------------------------------------------- Wound Assessment Details Patient Name: Date of Service: LIMMIE, SCHOENBERG 09/18/2019 8:00 AM Medical Record [id number] Patient Account Number: [id number] Date of Birth/Sex: Treating RN: Nov 10, 1960 (59 y.o. Ernestene Mention Primary Care Chen Holzman:  Karle Plumber Other Clinician: Referring Esker Dever: Treating Daisean Brodhead/Extender:Robson, [place] Scrape, Pecola Leisure in Treatment: 2 Wound Status Wound Number: 2 Primary Lymphedema Etiology: Wound Location: Left, Dorsal Foot Secondary Diabetic Wound/Ulcer of the Lower Extremity Wounding Event: Pressure Injury Etiology: Date Acquired: 07/30/2019 Wound Open Weeks Of Treatment: 2 Status: Clustered Wound: No Comorbid Lymphedema, Arrhythmia, Congestive Heart History: Failure, Hypertension, Type II Diabetes, End Stage Renal Disease, Gout Wound Measurements Length: (cm) 0 % Reduct Width: (cm) 0 % Reduct Depth: (cm) 0 Epitheli Area: (cm) 0 Tunneli Volume: (cm) 0 Undermi Wound Description Full Thickness Without Exposed Support Foul Od Classification: Structures Slough/ Wound Distinct, outline attached Margin: Exudate Small Amount: Exudate Serous Type: Exudate amber Color: Wound Bed Granulation Amount: None Present (0%) Necrotic Amount: None Present (0%) Fascia E Fat Laye Tendon E Muscle E Joint Ex Bone Exp or After Cleansing: No Fibrino No Exposed Structure xposed: No r (Subcutaneous Tissue) Exposed: No xposed: No xposed: No posed: No osed: No ion in Area: 100% ion in Volume: 100% alization: Large (67-100%) ng: No ning: No Electronic Signature(s) Signed: 09/18/2019 6:08:25 PM By: Baruch Gouty RN, BSN Entered By: Baruch Gouty on 09/18/2019 08:02:04 -------------------------------------------------------------------------------- Wound Assessment Details Patient Name: Date of Service: [name] Sells D. 09/18/2019 8:00 AM Medical Record [id number] Patient Account Number: [id number] Date of Birth/Sex: Treating RN: Mar 04, 1961 (59 y.o.  Ernestene Mention Primary Care Janellie Tennison: Karle Plumber Other Clinician: Referring Dakari Cregger: Treating Arel Tippen/Extender:Robson, [place] Scrape, Pecola Leisure in Treatment: 2 Wound Status Wound Number: 3  Primary Lymphedema Etiology: Wound Location: Right, Dorsal Foot Secondary Diabetic Wound/Ulcer of the Lower Extremity Wounding Event: Pressure Injury Etiology: Date Acquired: 07/30/2019 Wound Open Weeks Of Treatment: 2 Status: Clustered Wound: No Comorbid Lymphedema, Arrhythmia, Congestive Heart History: Failure, Hypertension, Type II Diabetes, End Stage Renal Disease, Gout Wound Measurements Length: (cm) 0 % Reduct Width: (cm) 0 % Reduct Depth: (cm) 0 Epitheli Area: (cm) 0 Tunneli Volume: (cm) 0 Undermi Wound Description Full Thickness Without Exposed Support Foul Odo Classification: Structures Slough/F Wound Distinct, outline attached Margin: Exudate None Present Amount: Wound Bed Granulation Amount: None Present (0%) Necrotic Amount: None Present (0%) Fascia E Fat Laye Tendon Muscle Joint E Bone Ex r After Cleansing: No ibrino No Exposed Structure xposed: No r (Subcutaneous Tissue) Exposed: No Exposed: No Exposed: No xposed: No posed: No ion in Area: 100% ion in Volume: 100% alization: Large (67-100%) ng: No ning: No Electronic Signature(s) Signed: 09/18/2019 6:08:25 PM By: Baruch Gouty RN, BSN Entered By: Baruch Gouty on 09/18/2019 08:02:17 -------------------------------------------------------------------------------- Vitals Details Patient Name: Date of Service: [name] Sells D. 09/18/2019 8:00 AM Medical Record [id number] Patient Account Number: [id number] Date of Birth/Sex: Treating RN: 1960-12-18 (59 y.o. Ernestene Mention Primary Care Nitin Mckowen: Karle Plumber Other Clinician: Referring Niyati Heinke: Treating Tyrea Froberg/Extender:Robson, [place] Scrape, Pecola Leisure in Treatment: 2 Vital Signs Time Taken: 07:44 Temperature (F): 98.6 Height (in): 70 Pulse (bpm): 59 Source: Stated Respiratory Rate (breaths/min): 20 Weight (lbs): 284 Blood Pressure (mmHg): 124/63 Source: Stated Capillary Blood Glucose (mg/dl): 211 Body  Mass Index (BMI): 40.7 Reference Range: 80 - 120 mg / dl Notes glucose per pt report this am Electronic Signature(s) Signed: 09/18/2019 6:08:25 PM By: Baruch Gouty RN, BSN Entered By: Baruch Gouty on 09/18/2019 07:45:41

## 2019-09-18 NOTE — Progress Notes (Signed)
[name], [name] ([id number]) Visit Report for 09/18/2019 HPI Details Patient Name: Date of Service: [name], [name] 09/18/2019 8:00 AM Medical Record Number:[id number] Patient Account Number: [id number] Date of Birth/Sex: Treating RN: [birth year] (59 y.o. Hessie Diener Primary Care Provider: Karle Plumber Other Clinician: Referring Provider: Treating Provider/Extender:Jerilyn Gillaspie, [place] Scrape, Pecola Leisure in Treatment: 2 History of Present Illness Location: Patient presents with a wound to the left buttock area. Quality: Patient reports experiencing a dull pain to affected area(s). Severity: Mildly severe wound with no evidence of infection Duration: Patient has had the wound for   2 weeks prior to presenting for treatment Timing: Pain in wound is Intermittent (comes and goes) HPI Description: ADMISSION 11/28/2018 This is a pleasant 59 year old man who has chronic lymphedema since about 2010. The exact etiology of this is not really clear indeed if this is been worked up it is not really clear. In any case he also has type 2 diabetes. He was in hospital recently for a small brainstem CVA discharged in late May with bilateral Unna wraps on his legs. Is not clear that he had wounds in the hospital. Nevertheless he comes in today with the same Unna boots on his legs. He has compression stockings at home but they are old and he wanted to get remeasured. He went to [facility] however they said that they would need up-to-date measurements which I believe we can provide today. He also has compression pumps which he uses intermittently at home. I am not really sure who ordered these. He does not have a wound history but states that when he went in the hospital his legs were so big that his stockings did not fit at home Past medical history includes type 2 diabetes, lymphedema, bursitis in the left knee on 10/23/2018, hyper lipidemia, recent brainstem CVA leaving him with some  neurologic disability but much better, Febres disease, pacemaker and hypertension. The patient had venous reflux studies in June 2019. There was abnormal reflux times bilaterally in the proximal veins but it does not look like they were able to obtain reflux in his bilateral lower legs. I am not sure what the issue was here may have been too much edema to visualize the veins 09/04/19; READMISSION this is a now 59 year old man who is been in this clinic at least once before most recently in June of last year. He has lymphedema the exact etiology of which is unclear. He has been fairly consistent and saying this goes back about 12 years. Almost totally confined to below his knees he wears 20/30 below-knee stockings. He also has compression pumps at home that he is mostly compliant with. Apparently about 2 weeks ago his legs began to swell. He left his stockings on for a prolonged period of time, didn't take them off in the eventually rubbed wounds in his dorsal feet just distal to the crease of his ankles. He has been unable to get his stockings on since then and has not been using his compression pumps.it is not clear what is been using to the wounds perhaps Bactroban. His past medical history is reviewed and includes type 2 diabetes with a recent hemoglobin A1c of 9, hypertension, Fabry's disease, hyperlipidemia. Brainstem CVA diastolic heart failure, stage III chronic renal failure and lymphedema as noted. his legs are much too large to do ABIs on. Previously we have not felt he had an arterial issue 4/6; the areas in the crease of his ankles are better on both sides. Tissue looks  healthy we have been using silver alginate under compression he is using his compression pumps 4/13; the wounds in the creases of his ankles on both sides which were issues with related to his inner layer stockings of his juxta lites are both closed. He has lymphedema and he has external compression pumps. I think he  is generally very compliant. He has significant lymphedema with pantaloon deformities around his ankle Electronic Signature(s) Signed: 09/18/2019 5:51:00 PM By: [name] MD Entered By: [name] on 09/18/2019 08:21:02 -------------------------------------------------------------------------------- Physical Exam Details Patient Name: Date of Service: [name] Sells D. 09/18/2019 8:00 AM Medical Record [id number] Patient Account Number: [id number] Date of Birth/Sex: Treating RN: November 10, 1960 (59 y.o. Hessie Diener Primary Care Provider: Karle Plumber Other Clinician: Referring Provider: Treating Provider/Extender:Brendy Ficek, [place] Scrape, Pecola Leisure in Treatment: 2 Cardiovascular Pedal pulses are palpable. Lateral lymphedema. Integumentary (Hair, Skin) No evidence of infection. Notes Wound exam; the patient's wounds are fully epithelialized. These were horizontal fissured wounds in the creases of both ankles anteriorly. He has much better edema control in his legs than he did when he came in at which time he was not using his stockings or his pumps. Electronic Signature(s) Signed: 09/18/2019 5:51:00 PM By: [name] MD Entered By: [name] on 09/18/2019 08:21:48 -------------------------------------------------------------------------------- Physician Orders Details Patient Name: Date of Service: [name] Sells D. 09/18/2019 8:00 AM Medical Record [id number] Patient Account Number: [id number] Date of Birth/Sex: Treating RN: 1961-02-07 (59 y.o. Hessie Diener Primary Care Provider: Karle Plumber Other Clinician: Referring Provider: Treating Provider/Extender:Brondon Wann, [place] Scrape, Pecola Leisure in Treatment: 2 Verbal / Phone Orders: No Diagnosis Coding ICD-10 Coding Code Description I89.0 Lymphedema, not elsewhere classified L97.512 Non-pressure chronic ulcer of other part of right foot with fat layer exposed L97.522  Non-pressure chronic ulcer of other part of left foot with fat layer exposed Discharge From [facility] Discharge from [place] - call if any future wound care needs. Skin Barriers/Peri-Wound Care Moisturizing lotion - both legs nightly. Edema Control Avoid standing for long periods of time Elevate legs to the level of the heart or above for 30 minutes daily and/or when sitting, a frequency of: - throughout the day. while sitting elevate legs. Exercise regularly Support Garment 30-40 mm/Hg pressure to: - patient to wear juxtalites to both legs. apply in the morning and remove at night. Segmental Compressive Device. - use lymphedema pumps twice a day for an hour each time. can wear compression wraps while using pumps. Electronic Signature(s) Signed: 09/18/2019 5:51:00 PM By: [name] MD Signed: 09/18/2019 6:03:05 PM By: Deon Pilling Entered By: Deon Pilling on 09/18/2019 08:17:13 -------------------------------------------------------------------------------- Problem List Details Patient Name: Date of Service: [name] Sells D. 09/18/2019 8:00 AM Medical Record [id number] Patient Account Number: [id number] Date of Birth/Sex: Treating RN: April 27, 1961 (59 y.o. Hessie Diener Primary Care Provider: Karle Plumber Other Clinician: Referring Provider: Treating Provider/Extender:Euriah Matlack, [place] Scrape, Pecola Leisure in Treatment: 2 Active Problems ICD-10 Evaluated Encounter Code Description Active Date Today Diagnosis I89.0 Lymphedema, not elsewhere classified 09/04/2019 No Yes L97.512 Non-pressure chronic ulcer of other part of right foot 09/04/2019 No Yes with fat layer exposed L97.522 Non-pressure chronic ulcer of other part of left foot 09/04/2019 No Yes with fat layer exposed Inactive Problems Resolved Problems Electronic Signature(s) Signed: 09/18/2019 5:51:00 PM By: [name] MD Entered By: [name] on 09/18/2019  08:20:04 -------------------------------------------------------------------------------- Progress Note Details Patient Name: Date of Service: [name] Sells D. 09/18/2019 8:00 AM Medical Record [id number] Patient Account Number: [id number] Date of Birth/Sex: Treating  RN: December 20, 1960 (59 y.o. Hessie Diener Primary Care Provider: Karle Plumber Other Clinician: Referring Provider: Treating Provider/Extender:Aarish Rockers, [place] Scrape, Pecola Leisure in Treatment: 2 Subjective History of Present Illness (HPI) The following HPI elements were documented for the patient's wound: Location: Patient presents with a wound to the left buttock area. Quality: Patient reports experiencing a dull pain to affected area(s). Severity: Mildly severe wound with no evidence of infection Duration: Patient has had the wound for   2 weeks prior to presenting for treatment Timing: Pain in wound is Intermittent (comes and goes) ADMISSION 11/28/2018 This is a pleasant 59 year old man who has chronic lymphedema since about 2010. The exact etiology of this is not really clear indeed if this is been worked up it is not really clear. In any case he also has type 2 diabetes. He was in hospital recently for a small brainstem CVA discharged in late May with bilateral Unna wraps on his legs. Is not clear that he had wounds in the hospital. Nevertheless he comes in today with the same Unna boots on his legs. He has compression stockings at home but they are old and he wanted to get remeasured. He went to [place] however they said that they would need up-to-date measurements which I believe we can provide today. He also has compression pumps which he uses intermittently at home. I am not really sure who ordered these. He does not have a wound history but states that when he went in the hospital his legs were so big that his stockings did not fit at home Past medical history includes type 2 diabetes,  lymphedema, bursitis in the left knee on 10/23/2018, hyper lipidemia, recent brainstem CVA leaving him with some neurologic disability but much better, Febres disease, pacemaker and hypertension. The patient had venous reflux studies in June 2019. There was abnormal reflux times bilaterally in the proximal veins but it does not look like they were able to obtain reflux in his bilateral lower legs. I am not sure what the issue was here may have been too much edema to visualize the veins 09/04/19; READMISSION this is a now 59 year old man who is been in this clinic at least once before most recently in June of last year. He has lymphedema the exact etiology of which is unclear. He has been fairly consistent and saying this goes back about 12 years. Almost totally confined to below his knees he wears 20/30 below-knee stockings. He also has compression pumps at home that he is mostly compliant with. Apparently about 2 weeks ago his legs began to swell. He left his stockings on for a prolonged period of time, didn't take them off in the eventually rubbed wounds in his dorsal feet just distal to the crease of his ankles. He has been unable to get his stockings on since then and has not been using his compression pumps.it is not clear what is been using to the wounds perhaps Bactroban. His past medical history is reviewed and includes type 2 diabetes with a recent hemoglobin A1c of 9, hypertension, Fabry's disease, hyperlipidemia. Brainstem CVA diastolic heart failure, stage III chronic renal failure and lymphedema as noted. his legs are much too large to do ABIs on. Previously we have not felt he had an arterial issue 4/6; the areas in the crease of his ankles are better on both sides. Tissue looks healthy we have been using silver alginate under compression he is using his compression pumps 4/13; the wounds in the creases of  his ankles on both sides which were issues with related to his inner  layer stockings of his juxta lites are both closed. He has lymphedema and he has external compression pumps. I think he is generally very compliant. He has significant lymphedema with pantaloon deformities around his ankle Objective Constitutional Vitals Time Taken: 7:44 AM, Height: 70 in, Source: Stated, Weight: 284 lbs, Source: Stated, BMI: 40.7, Temperature: 98.6 F, Pulse: 59 bpm, Respiratory Rate: 20 breaths/min, Blood Pressure: 124/63 mmHg, Capillary Blood Glucose: 211 mg/dl. General Notes: glucose per pt report this am Cardiovascular Pedal pulses are palpable. Lateral lymphedema. General Notes: Wound exam; the patient's wounds are fully epithelialized. These were horizontal fissured wounds in the creases of both ankles anteriorly. He has much better edema control in his legs than he did when he came in at which time he was not using his stockings or his pumps. Integumentary (Hair, Skin) No evidence of infection. Wound #2 status is Open. Original cause of wound was Pressure Injury. The wound is located on the Left,Dorsal Foot. The wound measures 0cm length x 0cm width x 0cm depth; 0cm^2 area and 0cm^3 volume. There is no tunneling or undermining noted. There is a small amount of serous drainage noted. The wound margin is distinct with the outline attached to the wound base. There is no granulation within the wound bed. There is no necrotic tissue within the wound bed. Wound #3 status is Open. Original cause of wound was Pressure Injury. The wound is located on the Right,Dorsal Foot. The wound measures 0cm length x 0cm width x 0cm depth; 0cm^2 area and 0cm^3 volume. There is no tunneling or undermining noted. There is a none present amount of drainage noted. The wound margin is distinct with the outline attached to the wound base. There is no granulation within the wound bed. There is no necrotic tissue within the wound bed. Assessment Active Problems ICD-10 Lymphedema, not  elsewhere classified Non-pressure chronic ulcer of other part of right foot with fat layer exposed Non-pressure chronic ulcer of other part of left foot with fat layer exposed Plan Discharge From [facility] Services: Discharge from [place] - call if any future wound care needs. Skin Barriers/Peri-Wound Care: Moisturizing lotion - both legs nightly. Edema Control: Avoid standing for long periods of time Elevate legs to the level of the heart or above for 30 minutes daily and/or when sitting, a frequency of: - throughout the day. while sitting elevate legs. Exercise regularly Support Garment 30-40 mm/Hg pressure to: - patient to wear juxtalites to both legs. apply in the morning and remove at night. Segmental Compressive Device. - use lymphedema pumps twice a day for an hour each time. can wear compression wraps while using pumps. 1. I have recommended at least daily lotion of his skin. He says he uses [employer] 2. I have recommended continuing his juxta lite stockings perhaps padding the areas in the creases of both ankles anteriorly with gauze and he will do this. 3. I have recommended using his pumps twice a day for an hour instead of once a day for 2 hours. He acknowledges this. 4. The usual of keeping his legs elevated when sitting for long periods. Continuing with leg exercises such as walking to mobilize fluid. He acknowledges this as well. 5. The patient can be discharged from the clinic. He seems generally compliant. If he breaks down in short order I may need to get him seen by a lymphedema specialist Electronic Signature(s) Signed: 09/18/2019 5:51:00 PM  By: [name] MD Entered By: [name] on 09/18/2019 08:23:18 -------------------------------------------------------------------------------- SuperBill Details Patient Name: Date of Service: [name] 09/18/2019 Medical Record [id number] Patient Account Number: [id number] Date of  Birth/Sex: Treating RN: May 12, 1961 (59 y.o. Hessie Diener Primary Care Provider: Karle Plumber Other Clinician: Referring Provider: Treating Provider/Extender:Hanni Milford, [place] Scrape, Pecola Leisure in Treatment: 2 Diagnosis Coding ICD-10 Codes Code Description I89.0 Lymphedema, not elsewhere classified L97.512 Non-pressure chronic ulcer of other part of right foot with fat layer exposed L97.522 Non-pressure chronic ulcer of other part of left foot with fat layer exposed Facility Procedures The patient participates with Medicare or their insurance follows the Medicare Facility Guidelines: CPT4 Code Description Modifier Quantity [id number] [place] VISIT-LEV 4 EST PT 1 Physician Procedures CPT4 Code Description: 2500370 [id number] - WC PHYS LEVEL 3 - EST PT ICD-10 Diagnosis Description I89.0 Lymphedema, not elsewhere classified L97.512 Non-pressure chronic ulcer of other part of right foot L97.522 Non-pressure chronic ulcer of other part of  left foot w Modifier: with fat layer e ith fat layer ex Quantity: 1 xposed posed Electronic Signature(s) Signed: 09/18/2019 5:51:00 PM By: [name] MD Entered By: [name] on 09/18/2019 08:23:34

## 2019-09-19 NOTE — Therapy (Signed)
[facility] [address] [place], [place], [address] Phone: [phone]   Fax:  [phone]  Physical Therapy Treatment  Patient Details  Name: [name] MRN: [id number] Date of Birth: 11-Mar-1961 Referring Provider (PT): Frann Rider, NP   Encounter Date: 09/18/2019  PT End of Session - 09/19/19 1230    Visit Number  12    Number of Visits  18    Date for PT Re-Evaluation  10/21/19    Authorization Type  [employer] Medicare-will need 10th visit progress note    PT Start Time  0935    PT Stop Time  1017    PT Time Calculation (min)  42 min    Activity Tolerance  Patient tolerated treatment well    Behavior During Therapy  [facility] for tasks assessed/performed       Past Medical History:  Diagnosis Date  . Bell's palsy   . Bifascicular block   . Chronic bronchitis ([place])    "seasonal; get it q yr"  . Chronic diastolic CHF (congestive heart failure) ([place]) 2010  . Chronic kidney disease (CKD), stage II (mild)    stage II to III/notes 07/23/2014  . Diastolic heart failure secondary to hypertrophic cardiomyopathy ([place]) CARDIOLOGIST-- DR Daneen Schick  . Dyspnea    increased exertion   . Edema 07/2013  . 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 Gout, arthritis 2014   bil feet. right worse  . History of cellulitis of skin with lymphangitis LEFT LEG  . HOCM (hypertrophic obstructive cardiomyopathy) ([place])    a. Echo 11/16: Severe LVH, EF 55-60%, abnormal GLS consistent with HOCM, no SAM, mild LAE  . Hypertension 20 years  . Lymphedema of lower extremity LEFT  >  RIGHT   "using ankle high socks at home; pump doesn't work for me" (07/23/2014)  . Pacemaker    02/12/11  . Pneumonia 08/2011  . Seasonal asthma NO INHALERS   30 years  . Second degree Mobitz II AV block    with syncope, s/p PPM  . Short of breath on exertion   . Type II diabetes mellitus ([place]) 2012   INSULIN DEPENDENT     Past Surgical History:  Procedure Laterality Date  . CARDIAC CATHETERIZATION  03-12-2011  DR Daneen Schick   HYPERTROPHIC CARDIOMYOPATHY WITH LV CAVITY  APPEARANCE CONSISTANT WITH SIGNIFICANT APICAL HYPERTROPHY/ NORMAL LVSF / EF 55%/ EVIDENCE OF DIASTOLIC DYSFUNCTION WITH EDP OF 23-23mHg AFTER A-WAVE/ NORMAL CORONARY ARTERIES  . EVALUATION UNDER ANESTHESIA WITH FISTULECTOMY N/A 06/23/2015   Procedure: EXAM UNDER ANESTHESIA WITH POSSIBLE FISTULOTOMY;  Surgeon: JJudeth Horn MD;  Location: [facility]  Service: General;  Laterality: N/A;  . HERNIA REPAIR  [id number]  Umbilical Hernia Repair  . INCISION AND DRAINAGE PERIRECTAL ABSCESS Left 07/29/2014   Procedure: IRRIGATION AND DEBRIDEMENT PERIRECTAL ABSCESS;  Surgeon: JDoreen Salvage MD;  [facility]  Service: General;  Laterality: Left;  Prone position  . INSERT / REPLACE / REMOVE PACEMAKER  02/12/2011   SJM implanted by Dr ARayann Hemanfor Mobitz II second degree AV block and syncope  . IRRIGATION AND DEBRIDEMENT ABSCESS N/A 07/27/2013   Procedure: IRRIGATION AND DEBRIDEMENT OF SKIN, SOFT TISSUE AND MUSCLES OF UPPER BACK (11X19X4cm) WITH 10 BLADE AND PULSATILE LAVAGE ;  Surgeon: EGayland Curry MD;  [facility]  Service: General;  Laterality: N/A;  . PPM GENERATOR CHANGEOUT N/A 05/22/2019   Procedure: PPM GENERATOR CHANGEOUT;  Surgeon: ARayann Heman  Jeneen Rinks, MD;  Location: [facility];  Service: Cardiovascular;  Laterality: N/A;  . UMBILICAL HERNIA REPAIR  03/22/2012   Procedure: HERNIA REPAIR UMBILICAL ADULT;  Surgeon: Leighton Ruff, MD;  Location: [facility];  Service: General;  Laterality: N/A;  Umbilical Hernia Repair     There were no vitals filed for this visit.  Subjective Assessment - 09/18/19 0937    Subjective  Have had the wraps removed on my legs.  Don't really feel like I notice changes with my balance.  I still feel like I"m walking a tight rope sometimes.    Patient Stated Goals  To work on this off-balance-ness.    Currently in Pain?   No/denies         [facility] PT Assessment - 09/18/19 0944      6 Minute Walk- Baseline   6 Minute Walk- Baseline  yes    BP (mmHg)  129/83    HR (bpm)  71    02 Sat (%RA)  98 %      6 Minute walk- Post Test   6 Minute Walk Post Test  yes    BP (mmHg)  142/76    HR (bpm)  92    02 Sat (%RA)  97 %    Modified Borg Scale for Dyspnea  1- Very mild shortness of breath    Perceived Rate of Exertion (Borg)  9- very light      6 minute walk test results    Aerobic Endurance Distance Walked  675    Endurance additional comments  2 standing rest breaks                   [facility] Adult PT Treatment/Exercise - 09/18/19 0944      Transfers   Transfers  Sit to Stand;Stand to Sit    Sit to Stand  5: Supervision;Without upper extremity assist;From chair/3-in-1    Five time sit to stand comments   12.65    Stand to Sit  5: Supervision      Ambulation/Gait   Ambulation/Gait  Yes    Ambulation/Gait Assistance  5: Supervision    Ambulation Distance (Feet)  675 Feet    Assistive device  Straight cane    Gait Pattern  Step-through pattern;Lateral hip instability;Poor foot clearance - left;Poor foot clearance - right;Narrow base of support    Ambulation Surface  Level;Indoor    Gait velocity  12.07 sec = 2.71 ft/sec    Gait Comments  Pt reports not having worn cast shoes since last visit trial      Dynamic Gait Index   Level Surface  Mild Impairment   8.37   Change in Gait Speed  Mild Impairment    Gait with Horizontal Head Turns  Mild Impairment    Gait with Vertical Head Turns  Mild Impairment    Gait and Pivot Turn  Mild Impairment   LOB with quick turn   Step Over Obstacle  Moderate Impairment    Step Around Obstacles  Mild Impairment    Steps  Mild Impairment    Total Score  15      Timed Up and Go Test   TUG  Normal TUG    Normal TUG (seconds)  13.59   12.93 sec 2nd trial     Self-Care   Self-Care  Other Self-Care Comments    Other Self-Care Comments   Pt did not  bring in cast shoes, has not tried at home.  Discussed progress towards goals (this is wk 7 of 9 in POC).  Discussed continueing towards LTGs and pt is agreeable.                PT Short Term Goals - 09/05/19 1732      PT SHORT TERM GOAL #1   Title  Pt will be independent with HEP for improved balance, strength, gait for decreased fall risk.  TARGET for all STGS: 08/24/2019    Time  5    Period  Weeks    Status  Achieved      PT SHORT TERM GOAL #2   Title  Pt will improve 5x sit<>stand to less than or equal to 16 seconds for improved functional lower extremity strength.    Baseline  16.59 sec at best 08/16/2019    Time  5    Period  Weeks    Status  Partially Met      PT SHORT TERM GOAL #3   Title  Pt will improve TUG score to less than or equal to 18 seconds for decreased fall risk.    Baseline  TUG 14 sec 08/16/2019    Time  5    Period  Weeks    Status  Achieved      PT SHORT TERM GOAL #4   Title  Pt will improve DGI score to at least 16/24 for decreased fall risk.    Baseline  15/24 08/16/2019    Time  5    Period  Weeks    Status  Partially Met      PT SHORT TERM GOAL #5   Title  Pt will verbalize understanding of fall prevention in home environment.    Time  5    Period  Weeks    Status  Achieved        PT Long Term Goals - 09/19/19 1230      PT LONG TERM GOAL #1   Title  Pt will be independent with progression of HEP to address strength, balance, and gait for decreased fall risk.  TARGET 09/21/2019    Time  9    Period  Weeks    Status  On-going      PT LONG TERM GOAL #2   Title  Pt will improve 5x sit<>stand to less than or equal to 12.5 seconds for improved functional strength.    Baseline  12.65 09/18/19    Time  9    Period  Weeks    Status  On-going      PT LONG TERM GOAL #3   Title  Pt will improve TUG score to less than or equal to 15 seconds for decreased fall risk.    Baseline  12.93 sec at best, 09/18/19    Time  9    Period  Weeks     Status  Achieved      PT LONG TERM GOAL #4   Title  Pt will improve DGI to at least 19/24 for decreased fall risk.    Time  9    Period  Weeks    Status  On-going      PT LONG TERM GOAL #5   Title  Pt will improve gait velocity to at least 2.3 ft/sec for improved gait efficiency and safety in community.    Baseline  2.71 ft/sec 09/18/2019    Time  9    Period  Weeks    Status  Achieved  Plan - 09/19/19 1231    Clinical Impression Statement  Based on pt's reports that he doesn't feel like his balance is improving, began looking at STGs (this is wk 7 of 9 in POC).  He has improved on 5x sit<>stand (just not quite to goal level), and he has improved gait velocity and TUG scores, meeting LTG 3 and 5.  He continues to demonstrate decreased dynamic balance on DGI with veering with head motions and changes of speed.  He demonstrates decreased endurance for gait on 6MWT, with 675 ft.  He is progressing slowly and steadily towards goals and would benefit from continued skilled PT to address balance, gait.    Personal Factors and Comorbidities  Comorbidity 3+    Comorbidities  DM, pacemaker, lymphedema in BLEs, FAbry's disease, diastolic heart failure, chronic kidney disease, heart failure, Bell's palsy.    Examination-Activity Limitations  Locomotion Level;Transfers    Examination-Participation Restrictions  Community Activity;Shop;Volunteer    Stability/Clinical Decision Making  Evolving/Moderate complexity    Rehab Potential  Good    PT Frequency  Other (comment)   1x/wk for 1 week, then 2x/wk for 8 weeks   PT Duration  Other (comment)   total POC = 9 weeks   PT Treatment/Interventions  ADLs/Self Care Home Management;Gait training;Stair training;Functional mobility training;Therapeutic activities;Therapeutic exercise;Balance training;Neuromuscular re-education;Patient/family education    PT Next Visit Plan  Ask pt if he tried Darco/velcro shoes around the house/bring into  therapy?. Continue SLS and hip strengthening, sidestepping/resisted sidestpping; dynamic balance on compliant surfaces, review turns.  At some point, may need to look at HEP/give walking program for exercise.    Consulted and Agree with Plan of Care  Patient       Patient will benefit from skilled therapeutic intervention in order to improve the following deficits and impairments:  Abnormal gait, Difficulty walking, Decreased balance, Decreased mobility, Decreased strength  Visit Diagnosis: Unsteadiness on feet  Other abnormalities of gait and mobility     Problem List Patient Active Problem List   Diagnosis Date Noted  . Morbid obesity ([place]) 08/23/2019  . 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 CVA (cerebral vascular accident) ([place]) 10/20/2018  . UTI (urinary tract infection) 10/19/2018  . Upper airway cough syndrome 08/01/2018  . Lymphedema 10/06/2016  . Gout, arthritis   . 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 Onychomycosis of toenail 09/26/2014  . Diabetes mellitus type 2 in obese ([place])   . Iron deficiency anemia 07/29/2014  . CKD (chronic kidney disease), stage III ([place])   . Chronic diastolic HF (heart failure) ([place]) 03/27/2013  . Peripheral edema 07/20/2012  . Obstructive sleep apnea, suspected 06/21/2012  . Pacemaker-St.Jude 02/23/2012  . Hypertension 08/09/2011  . COPD with asthma ([place]) 08/09/2011  . Mobitz type II atrioventricular block 05/19/2011  . 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[place]) 05/19/2011    Siah Kannan W. 09/19/2019, 12:35 PM  Frazier Butt., PT  [facility] [address] [place], [place], [address] Phone: [phone]   Fax:  [phone]  Name: DECKLAN MAU MRN: [id number] Date of Birth: 05-03-1961

## 2019-09-19 NOTE — Progress Notes (Signed)
[name], [name] ([id number]) Visit Report for 09/04/2019 Allergy List Details Patient Name: Date of Service: [name], [name] 09/04/2019 1:15 PM Medical Record [id number] Patient Account Number: [id number] Date of Birth/Sex: Treating RN: 08-Nov-1960 (59 y.o. [name] Primary Care Aikam Hellickson: Karle Plumber Other Clinician: Referring Mari Battaglia: Treating Quantasia Stegner/Extender:Robson, [place] Scrape, Pecola Leisure in Treatment: 0 Allergies Active Allergies Shellfish Containing Products Reaction: itching Severity: Moderate lisinopril Reaction: anaphylaxis Severity: Severe Allergy Notes Electronic Signature(s) Signed: 09/05/2019 6:50:57 PM By: Baruch Gouty RN, BSN Entered By: Baruch Gouty on 09/04/2019 13:35:34 -------------------------------------------------------------------------------- Arrival Information Details Patient Name: Date of Service: [name] Sells D. 09/04/2019 1:15 PM Medical Record [id number] Patient Account Number: [id number] Date of Birth/Sex: Treating RN: 1960/12/23 (58 y.o. [name]) Carlene Coria Primary Care Carylon Tamburro: Karle Plumber Other Clinician: Referring Bran Aldridge: Treating Chance Karam/Extender:Robson, [place] Scrape, Pecola Leisure in Treatment: 0 Visit Information Patient Arrived: Kasandra Knudsen Arrival Time: 13:20 Accompanied By: self Transfer Assistance: None Patient Identification Verified: Yes Secondary Verification Process Completed: Yes History Since Last Visit Added or deleted any medications: No Any new allergies or adverse reactions: No Had a fall or experienced change in activities of daily living that may affect risk of falls: No Signs or symptoms of abuse/neglect since last visito No Hospitalized since last visit: No Implantable device outside of the clinic excluding cellular tissue based products placed in the center since last visit: No Has Dressing in Place as Prescribed: Yes Pain Present Now: No Electronic  Signature(s) Signed: 09/19/2019 9:21:08 AM By: Sandre Kitty Entered By: Sandre Kitty on 09/04/2019 13:24:48 -------------------------------------------------------------------------------- Clinic Level of Care Assessment Details Patient Name: Date of Service: [name], [name] 09/04/2019 1:15 PM Medical Record [id number] Patient Account Number: [id number] Date of Birth/Sex: Treating RN: 08-22-1960 (58 y.o. [name] Primary Care Hajer Dwyer: Karle Plumber Other Clinician: Referring Jovann Luse: Treating Bellina Tokarczyk/Extender:Robson, [place] Scrape, Pecola Leisure in Treatment: 0 Clinic Level of Care Assessment Items TOOL 1 Quantity Score X - Use when EandM and Procedure is performed on INITIAL visit 1 0 ASSESSMENTS - Nursing Assessment / Reassessment X - General Physical Exam (combine w/ comprehensive assessment (listed just below) 1 20 when performed on new pt. evals) X - Comprehensive Assessment (HX, ROS, Risk Assessments, Wounds Hx, etc.) 1 25 ASSESSMENTS - Wound and Skin Assessment / Reassessment []  - Dermatologic / Skin Assessment (not related to wound area) 0 ASSESSMENTS - Ostomy and/or Continence Assessment and Care []  - Incontinence Assessment and Management 0 []  - Ostomy Care Assessment and Management (repouching, etc.) 0 PROCESS - Coordination of Care X - Simple Patient / Family Education for ongoing care 1 15 []  - Complex (extensive) Patient / Family Education for ongoing care 0 X - Staff obtains Programmer, systems, Records, Test Results / Process Orders 1 10 []  - Staff telephones HHA, Nursing Homes / Clarify orders / etc 0 []  - Routine Transfer to another Facility (non-emergent condition) 0 []  - Routine Hospital Admission (non-emergent condition) 0 X - New Admissions / Biomedical engineer / Ordering NPWT, Apligraf, etc. 1 15 []  - Emergency Hospital Admission (emergent condition) 0 PROCESS - Special Needs []  - Pediatric / Minor Patient Management 0 []  -  Isolation Patient Management 0 []  - Hearing / Language / Visual special needs 0 []  - Assessment of Community assistance (transportation, D/C planning, etc.) 0 []  - Additional assistance / Altered mentation 0 []  - Support Surface(s) Assessment (bed, cushion, seat, etc.) 0 INTERVENTIONS - Miscellaneous []  - External ear exam 0 []  - Patient Transfer (multiple staff / Civil Service fast streamer / Similar devices) 0 []  -  Simple Staple / Suture removal (25 or less) 0 []  - Complex Staple / Suture removal (26 or more) 0 []  - Hypo/Hyperglycemic Management (do not check if billed separately) 0 X - Ankle / Brachial Index (ABI) - do not check if billed separately 1 15 Has the patient been seen at the hospital within the last three years: Yes Total Score: 100 Level Of Care: New/Established - Level 3 Electronic Signature(s) Signed: 09/05/2019 5:40:01 PM By: Carlene Coria RN Entered By: Carlene Coria on 09/05/2019 09:38:43 -------------------------------------------------------------------------------- Compression Therapy Details Patient Name: Date of Service: [name], [name] 09/04/2019 1:15 PM Medical Record [id number] Patient Account Number: [id number] Date of Birth/Sex: Treating RN: 09/19/1960 (58 y.o. [name] Primary Care Jacqui Headen: Karle Plumber Other Clinician: Referring Bridie Colquhoun: Treating Riley Hallum/Extender:Robson, [place] Scrape, Pecola Leisure in Treatment: 0 Compression Therapy Performed for Wound Wound #3 Right,Dorsal Foot Assessment: Performed By: Jake Church, RN Compression Type: Three Layer Post Procedure Diagnosis Same as Pre-procedure Electronic Signature(s) Signed: 09/05/2019 5:40:01 PM By: Carlene Coria RN Entered By: Carlene Coria on 09/05/2019 09:39:17 -------------------------------------------------------------------------------- Compression Therapy Details Patient Name: Date of Service: [name], [name] 09/04/2019 1:15 PM Medical Record [id number]  Patient Account Number: [id number] Date of Birth/Sex: Treating RN: 1961-05-23 (58 y.o. [name] Primary Care Kallin Henk: Karle Plumber Other Clinician: Referring Samyra Limb: Treating Jazzie Trampe/Extender:Robson, [place] Scrape, Pecola Leisure in Treatment: 0 Compression Therapy Performed for Wound Wound #2 Left,Dorsal Foot Assessment: Performed By: Jake Church, RN Compression Type: Three Layer Post Procedure Diagnosis Same as Pre-procedure Electronic Signature(s) Signed: 09/05/2019 5:40:01 PM By: Carlene Coria RN Entered By: Carlene Coria on 09/05/2019 09:39:17 -------------------------------------------------------------------------------- Encounter Discharge Information Details Patient Name: Date of Service: [name], [name] 09/04/2019 1:15 PM Medical Record [id number] Patient Account Number: [id number] Date of Birth/Sex: Treating RN: November 18, 1960 (59 y.o. [name] Primary Care Kaoru Rezendes: Karle Plumber Other Clinician: Referring Jarrius Huaracha: Treating Tyrina Hines/Extender:Robson, [place] Scrape, Pecola Leisure in Treatment: 0 Encounter Discharge Information Items Discharge Condition: Stable Ambulatory Status: Ambulatory Discharge Destination: Home Transportation: Private Auto Accompanied By: self Schedule Follow-up Appointment: Yes Clinical Summary of Care: Patient Declined Electronic Signature(s) Signed: 09/10/2019 4:55:48 PM By: Kela Millin Entered By: Kela Millin on 09/07/2019 08:22:04 -------------------------------------------------------------------------------- Lower Extremity Assessment Details Patient Name: Date of Service: [name], [name] 09/04/2019 1:15 PM Medical Record [id number] Patient Account Number: [id number] Date of Birth/Sex: Treating RN: February 02, 1961 (59 y.o. [name] Primary Care Irvin Bastin: Karle Plumber Other Clinician: Referring Diamante Rubin: Treating Joyous Gleghorn/Extender:Robson, Dayton Scrape,  Pecola Leisure in Treatment: 0 Edema Assessment Assessed: [Left: No] [Right: No] Edema: [Left: Yes] [Right: Yes] Calf Left: Right: Point of Measurement: cm From Medial Instep 55.4 cm 50.5 cm Ankle Left: Right: Point of Measurement: cm From Medial Instep 47.5 cm 43.7 cm Vascular Assessment Pulses: Dorsalis Pedis Palpable: [Left:Yes] [Right:No] Electronic Signature(s) Signed: 09/05/2019 6:50:57 PM By: Baruch Gouty RN, BSN Entered By: Baruch Gouty on 09/04/2019 13:52:21 -------------------------------------------------------------------------------- Multi Wound Chart Details Patient Name: Date of Service: [name] Sells D. 09/04/2019 1:15 PM Medical Record [id number] Patient Account Number: [id number] Date of Birth/Sex: Treating RN: 1961-04-15 (58 y.o. [name] Primary Care Major Santerre: Karle Plumber Other Clinician: Referring Zania Kalisz: Treating Codee Bloodworth/Extender:Robson, [place] Scrape, Pecola Leisure in Treatment: 0 Vital Signs Height(in): 70 Capillary Blood 152 Glucose(mg/dl): Weight(lbs): 284 Pulse(bpm): 43 Body Mass Index(BMI): 41 Blood Pressure(mmHg): 129/40 Temperature(F): 98.3 Respiratory 18 Rate(breaths/min): Photos: [2:No Photos] [3:No Photos] [N/A:N/A] Wound Location: [2:Left Foot - Dorsal] [3:Right Foot - Dorsal] [N/A:N/A] Wounding Event: [2:Pressure Injury] [3:Pressure Injury] [N/A:N/A] Primary Etiology: [2:Lymphedema] [3:Lymphedema] [N/A:N/A] Secondary Etiology: [  2: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/A Lower Extremity] [3:Lower Extremity] Comorbid History: [2:Lymphedema, Arrhythmia, Lymphedema, Arrhythmia, N/A Congestive Heart Failure, Congestive Heart Failure, Hypertension, Type II Diabetes, End Stage Renal Diabetes, End Stage Renal Disease, Gout] [3:Hypertension, Type II Disease, Gout] Date Acquired: [2:07/30/2019] [3:07/30/2019] [N/A:N/A] Weeks of Treatment: [2:0] [3:0] [N/A:N/A] Wound Status: [2:Open] [3:Open]  [N/A:N/A] Measurements L x W x D 0.6x5.7x0.3 [3:0.6x3.7x0.2] [N/A:N/A] (cm) Area (cm) : [2:2.686] [3:1.744] [N/A:N/A] Volume (cm) : [2:0.806] [3:0.349] [N/A:N/A] Classification: [2:Full Thickness Without Exposed Support Structures Exposed Support Structures] [3:Full Thickness Without] [N/A:N/A] Exudate Amount: [2:Small] [3:Small] [N/A:N/A] Exudate Type: [2:Serosanguineous] [3:Serosanguineous] [N/A:N/A] Exudate Color: [2:red, brown] [3:red, brown] [N/A:N/A] Wound Margin: [2:Distinct, outline attached Distinct, outline attached N/A] Granulation Amount: [2:Large (67-100%)] [3:Large (67-100%)] [N/A:N/A] Granulation Quality: [2:Red, Hyper-granulation] [3:Red, Hyper-granulation] [N/A:N/A] Necrotic Amount: [2:None Present (0%)] [3:Small (1-33%)] [N/A:N/A] Exposed Structures: [2:Fat Layer (Subcutaneous Fat Layer (Subcutaneous N/A Tissue) Exposed: Yes Fascia: No Tendon: No Muscle: No Joint: No Bone: No] [3:Tissue) Exposed: Yes Fascia: No Tendon: No Muscle: No Joint: No Bone: No] Epithelialization: [2:Small (1-33%)] [3:Small (1-33%)] [N/A:N/A] Procedures Performed: Compression Therapy [3:Chemical Cauterization Compression Therapy] [N/A:N/A] Treatment Notes Electronic Signature(s) Signed: 09/07/2019 4:56:09 PM By: Carlene Coria RN Signed: 09/10/2019 7:44:34 AM By: [name] MD Entered By: [name] on 09/05/2019 18:38:42 -------------------------------------------------------------------------------- [place] Details Patient Name: Date of Service: [name] Sells D. 09/04/2019 1:15 PM Medical Record [id number] Patient Account Number: [id number] Date of Birth/Sex: Treating RN: 05-24-1961 (58 y.o. [name], Morey Hummingbird Primary Care Daisi Kentner: Karle Plumber Other Clinician: Referring Nashley Cordoba: Treating Dorse Locy/Extender:Robson, [place] Scrape, Pecola Leisure in Treatment: 0 Active Inactive Abuse / Safety / Falls / Self Care Management Nursing Diagnoses: Potential  for injury related to falls Goals: Patient will not experience any injury related to falls Date Initiated: 09/05/2019 Target Resolution Date: 10/05/2019 Goal Status: Active Interventions: Assess Activities of Daily Living upon admission and as needed Assess fall risk on admission and as needed Assess: immobility, friction, shearing, incontinence upon admission and as needed Assess impairment of mobility on admission and as needed per policy Assess personal safety and home safety (as indicated) on admission and as needed Assess self care needs on admission and as needed Notes: Nutrition Nursing Diagnoses: Impaired glucose control: actual or potential Goals: Patient/caregiver verbalizes understanding of need to maintain therapeutic glucose control per primary care physician Date Initiated: 09/05/2019 Target Resolution Date: 10/05/2019 Goal Status: Active Interventions: Assess HgA1c results as ordered upon admission and as needed Assess patient nutrition upon admission and as needed per policy Notes: Wound/Skin Impairment Nursing Diagnoses: Knowledge deficit related to ulceration/compromised skin integrity Goals: Patient/caregiver will verbalize understanding of skin care regimen Date Initiated: 09/05/2019 Target Resolution Date: 10/05/2019 Goal Status: Active Ulcer/skin breakdown will have a volume reduction of 30% by week 4 Date Initiated: 09/05/2019 Target Resolution Date: 10/05/2019 Goal Status: Active Interventions: Assess patient/caregiver ability to obtain necessary supplies Assess patient/caregiver ability to perform ulcer/skin care regimen upon admission and as needed Assess ulceration(s) every visit Notes: Electronic Signature(s) Signed: 09/05/2019 5:40:01 PM By: Carlene Coria RN Entered By: Carlene Coria on 09/05/2019 09:37:06 -------------------------------------------------------------------------------- Pain Assessment Details Patient Name: Date of Service: [name], [name] 09/04/2019 1:15 PM Medical Record [id number] Patient Account Number: [id number] Date of Birth/Sex: Treating RN: 18-Aug-1960 (59 y.o. [name] Primary Care Kieryn Burtis: Karle Plumber Other Clinician: Referring Honest Safranek: Treating Biviana Saddler/Extender:Robson, [place] Scrape, Pecola Leisure in Treatment: 0 Active Problems Location of Pain Severity and Description of Pain Patient Has Paino No Site Locations Rate the pain. Current  Pain Level: 0 Pain Management and Medication Current Pain Management: Electronic Signature(s) Signed: 09/05/2019 6:50:57 PM By: Baruch Gouty RN, BSN Entered By: Baruch Gouty on 09/04/2019 13:59:46 -------------------------------------------------------------------------------- Patient/Caregiver Education Details Patient Name: Date of Service: [name] 3/30/2021andnbsp1:15 PM Medical Record [phone] Patient Account Number: [id number] Date of Birth/Gender: Treating RN: December 31, 1960 (58 y.o. [name] Primary Care Physician: Karle Plumber Other Clinician: Referring Physician: Treating Physician/Extender:Robson, [place] Scrape, Pecola Leisure in Treatment: 0 Education Assessment Education Provided To: Patient Education Topics Provided Infection: Methods: Explain/Verbal Responses: State content correctly Nutrition: Methods: Explain/Verbal Responses: State content correctly Welcome To [facility]: Methods: Explain/Verbal Responses: State content correctly Wound/Skin Impairment: Methods: Explain/Verbal Responses: State content correctly Electronic Signature(s) Signed: 09/05/2019 5:40:01 PM By: Carlene Coria RN Entered By: Carlene Coria on 09/05/2019 09:37:37 -------------------------------------------------------------------------------- Wound Assessment Details Patient Name: Date of Service: [name], [name] 09/04/2019 1:15 PM Medical Record [id number] Patient Account Number:  [id number] Date of Birth/Sex: Treating RN: 1961-03-11 (59 y.o. [name] Primary Care Iniya Matzek: Karle Plumber Other Clinician: Referring Shanique Aslinger: Treating Leslyn Monda/Extender:Robson, [place] Scrape, Pecola Leisure in Treatment: 0 Wound Status Wound Number: 2 Primary Lymphedema Etiology: Wound Location: Left Foot - Dorsal Secondary Diabetic Wound/Ulcer of the Lower Extremity Wounding Event: Pressure Injury Etiology: Date Acquired: 07/30/2019 Wound Open Weeks Of Treatment: 0 Status: Clustered Wound: No Comorbid Lymphedema, Arrhythmia, Congestive Heart History: Failure, Hypertension, Type II Diabetes, End Stage Renal Disease, Gout Photos Photo Uploaded By: Mikeal Hawthorne on 09/10/2019 14:04:41 Wound Measurements Length: (cm) 0.6 % Reduct Width: (cm) 5.7 % Reduct Depth: (cm) 0.3 Epitheli Area: (cm) 2.686 Tunneli Volume: (cm) 0.806 Undermi Wound Description Full Thickness Without Exposed Support Foul Odo Classification: Structures Slough/F Wound Distinct, outline attached Margin: Exudate Small Amount: Exudate Serosanguineous Type: Exudate red, brown Color: Wound Bed Granulation Amount: Large (67-100%) Granulation Quality: Red, Hyper-granulation Fascia E Necrotic Amount: None Present (0%) Fat Laye Tendon E Muscle E Joint Ex Bone Exp r After Cleansing: No ibrino No Exposed Structure xposed: No r (Subcutaneous Tissue) Exposed: Yes xposed: No xposed: No posed: No osed: No ion in Area: ion in Volume: alization: Small (1-33%) ng: No ning: No Electronic Signature(s) Signed: 09/05/2019 6:50:57 PM By: Baruch Gouty RN, BSN Entered By: Baruch Gouty on 09/04/2019 13:55:56 -------------------------------------------------------------------------------- Wound Assessment Details Patient Name: Date of Service: [name] Sells D. 09/04/2019 1:15 PM Medical Record [id number] Patient Account Number: [id number] Date of Birth/Sex: Treating  RN: Oct 30, 1960 (59 y.o. [name] Primary Care Hilde Churchman: Karle Plumber Other Clinician: Referring Earlene Bjelland: Treating Jameika Kinn/Extender:Robson, [place] Scrape, Pecola Leisure in Treatment: 0 Wound Status Wound Number: 3 Primary Lymphedema Etiology: Wound Location: Right Foot - Dorsal Secondary Diabetic Wound/Ulcer of the Lower Extremity Wounding Event: Pressure Injury Etiology: Date Acquired: 07/30/2019 Wound Open Weeks Of Treatment: 0 Status: Clustered Wound: No Comorbid Lymphedema, Arrhythmia, Congestive Heart History: Failure, Hypertension, Type II Diabetes, End Stage Renal Disease, Gout Photos Photo Uploaded By: Mikeal Hawthorne on 09/10/2019 14:04:44 Wound Measurements Length: (cm) 0.6 % Reduct Width: (cm) 3.7 % Reduct Depth: (cm) 0.2 Epitheli Area: (cm) 1.744 Tunneli Volume: (cm) 0.349 Undermi Wound Description Classification: Full Thickness Without Exposed Support Foul Odo Structures Slough/F Wound Distinct, outline attached Margin: Exudate Small Amount: Exudate Serosanguineous Type: Exudate red, brown Color: Wound Bed Granulation Amount: Large (67-100%) Granulation Quality: Red, Hyper-granulation Fascia E Necrotic Amount: Small (1-33%) Fat Layer Necrotic Quality: Adherent Slough Tendon Exp Muscle Exp Joint Expo Bone Expos r After Cleansing: No ibrino No Exposed Structure xposed: No (Subcutaneous Tissue) Exposed: Yes osed: No osed:  No sed: No ed: No ion in Area: ion in Volume: alization: Small (1-33%) ng: No ning: No Electronic Signature(s) Signed: 09/05/2019 6:50:57 PM By: Baruch Gouty RN, BSN Entered By: Baruch Gouty on 09/04/2019 13:59:34 -------------------------------------------------------------------------------- [place] Details Patient Name: Date of Service: [name] Sells D. 09/04/2019 1:15 PM Medical Record [id number] Patient Account Number: [id number] Date of Birth/Sex: Treating RN: 1960/08/19 (58 y.o. [name])  Carlene Coria Primary Care Armonee Bojanowski: Karle Plumber Other Clinician: Referring Maria Gallicchio: Treating Nam Vossler/Extender:Robson, [place] Scrape, Pecola Leisure in Treatment: 0 Vital Signs Time Taken: 13:25 Temperature (F): 98.3 Height (in): 70 Pulse (bpm): 64 Source: Stated Respiratory Rate (breaths/min): 18 Weight (lbs): 284 Blood Pressure (mmHg): 129/40 Source: Stated Capillary Blood Glucose (mg/dl): 152 Body Mass Index (BMI): 40.7 Reference Range: 80 - 120 mg / dl Electronic Signature(s) Signed: 09/05/2019 6:50:57 PM By: Baruch Gouty RN, BSN Entered By: Baruch Gouty on 09/04/2019 13:35:09

## 2019-09-20 ENCOUNTER — Other Ambulatory Visit: Payer: Self-pay | Admitting: Internal Medicine

## 2019-09-20 ENCOUNTER — Other Ambulatory Visit: Payer: Self-pay

## 2019-09-20 ENCOUNTER — Ambulatory Visit: Payer: Medicare HMO | Admitting: Physical Therapy

## 2019-09-20 DIAGNOSIS — M6281 Muscle weakness (generalized): Secondary | ICD-10-CM

## 2019-09-20 DIAGNOSIS — R2681 Unsteadiness on feet: Secondary | ICD-10-CM

## 2019-09-20 DIAGNOSIS — R2689 Other abnormalities of gait and mobility: Secondary | ICD-10-CM | POA: Diagnosis not present

## 2019-09-20 NOTE — Therapy (Signed)
[facility] [address] [place], [place], [address] Phone: [phone]   Fax:  [phone]  Physical Therapy Treatment  Patient Details  Name: [name] MRN: [id number] Date of Birth: 04/09/1961 Referring Provider (PT): Frann Rider, NP   Encounter Date: 09/20/2019  PT End of Session - 09/20/19 1133    Visit Number  13    Number of Visits  18    Date for PT Re-Evaluation  10/21/19    Authorization Type  [employer] Medicare-will need 10th visit progress note    PT Start Time  0934    PT Stop Time  1018    PT Time Calculation (min)  44 min    Equipment Utilized During Treatment  Gait belt    Activity Tolerance  Patient tolerated treatment well    Behavior During Therapy  [facility] for tasks assessed/performed       Past Medical History:  Diagnosis Date  . Bell's palsy   . Bifascicular block   . Chronic bronchitis ([place])    "seasonal; get it q yr"  . Chronic diastolic CHF (congestive heart failure) ([place]) 2010  . Chronic kidney disease (CKD), stage II (mild)    stage II to III/notes 07/23/2014  . Diastolic heart failure secondary to hypertrophic cardiomyopathy ([place]) CARDIOLOGIST-- DR Daneen Schick  . Dyspnea    increased exertion   . Edema 07/2013  . 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 Gout, arthritis 2014   bil feet. right worse  . History of cellulitis of skin with lymphangitis LEFT LEG  . HOCM (hypertrophic obstructive cardiomyopathy) ([place])    a. Echo 11/16: Severe LVH, EF 55-60%, abnormal GLS consistent with HOCM, no SAM, mild LAE  . Hypertension 20 years  . Lymphedema of lower extremity LEFT  >  RIGHT   "using ankle high socks at home; pump doesn't work for me" (07/23/2014)  . Pacemaker    02/12/11  . Pneumonia 08/2011  . Seasonal asthma NO INHALERS   30 years  . Second degree Mobitz II AV block    with syncope, s/p PPM  . Short of breath on exertion   . Type II  diabetes mellitus ([place]) 2012   INSULIN DEPENDENT    Past Surgical History:  Procedure Laterality Date  . CARDIAC CATHETERIZATION  03-12-2011  DR Daneen Schick   HYPERTROPHIC CARDIOMYOPATHY WITH LV CAVITY  APPEARANCE CONSISTANT WITH SIGNIFICANT APICAL HYPERTROPHY/ NORMAL LVSF / EF 55%/ EVIDENCE OF DIASTOLIC DYSFUNCTION WITH EDP OF 23-45mHg AFTER A-WAVE/ NORMAL CORONARY ARTERIES  . EVALUATION UNDER ANESTHESIA WITH FISTULECTOMY N/A 06/23/2015   Procedure: EXAM UNDER ANESTHESIA WITH POSSIBLE FISTULOTOMY;  Surgeon: JJudeth Horn MD;  Location: [facility]  Service: General;  Laterality: N/A;  . HERNIA REPAIR  [id number]  Umbilical Hernia Repair  . INCISION AND DRAINAGE PERIRECTAL ABSCESS Left 07/29/2014   Procedure: IRRIGATION AND DEBRIDEMENT PERIRECTAL ABSCESS;  Surgeon: JDoreen Salvage MD;  [facility]  Service: General;  Laterality: Left;  Prone position  . INSERT / REPLACE / REMOVE PACEMAKER  02/12/2011   SJM implanted by Dr ARayann Hemanfor Mobitz II second degree AV block and syncope  . IRRIGATION AND DEBRIDEMENT ABSCESS N/A 07/27/2013   Procedure: IRRIGATION AND DEBRIDEMENT OF SKIN, SOFT TISSUE AND MUSCLES OF UPPER BACK (11X19X4cm) WITH 10 BLADE AND PULSATILE LAVAGE ;  Surgeon: EGayland Curry MD;  [facility]  Service: General;  Laterality: N/A;  . PPM GENERATOR CHANGEOUT N/A  05/22/2019   Procedure: PPM GENERATOR CHANGEOUT;  Surgeon: Thompson Grayer, MD;  Location: [facility];  Service: Cardiovascular;  Laterality: N/A;  . UMBILICAL HERNIA REPAIR  03/22/2012   Procedure: HERNIA REPAIR UMBILICAL ADULT;  Surgeon: Leighton Ruff, MD;  Location: [facility];  Service: General;  Laterality: N/A;  Umbilical Hernia Repair     There were no vitals filed for this visit.  Subjective Assessment - 09/20/19 0941    Subjective  Wear those Darco shoes at home sometimes, and it may help.    Patient Stated Goals  To work on this off-balance-ness.    Currently in Pain?  No/denies                        [facility] Adult PT Treatment/Exercise - 09/20/19 0001      Ambulation/Gait   Ambulation/Gait  Yes    Ambulation/Gait Assistance  5: Supervision    Ambulation/Gait Assistance Details  Cues with gait for wider BOS, to shift weight through hips with gait, for slowed pattern, more stability (noted improved stability with gait last lap with attention to wider BOS)    Ambulation Distance (Feet)  230 Feet   115   Assistive device  Straight cane    Gait Pattern  Step-through pattern;Lateral hip instability;Poor foot clearance - left;Poor foot clearance - right;Narrow base of support    Ambulation Surface  Level;Indoor    Gait Comments  Gait with environmental scanning, with 2 episodes of veering/LOB to L, pt able to regain balance with stopping briefly.      High Level Balance   High Level Balance Activities  Other (comment)    High Level Balance Comments  Box-step activity:  Sidestep R, back step, sidestep L, forward step, sidestep R.  Then performed in reverse.  Performed x 5 sets.  Pt does not use UE support, but occasionally reaches for counter for support.  Cues for widened BOS with transition/change of directions to improve balance.  Then progressed to sidestepping diagonals, changing directions at cones, pt able to hold cane and not use >75% of the time, useing a squat, lowered posture.  Minimal LOB near end of activity.      Exercises   Exercises  Other Exercises    Other Exercises   Alternating heel raises, x 10 reps, 2 sets; bilateral heel/toe raises x 10 reps.      Knee/Hip Exercises: Standing   Functional Squat  1 set;10 reps    Other Standing Knee Exercises  2nd set of squats x 10 reps, followed by heel raises each rep (UE support at counter).          Balance Exercises - 09/20/19 0944      Balance Exercises: Standing   Step Ups  Forward;6 inch;UE support 2   Step up/up, down/down x 10 reps, cues for widened BOS   Other Standing Exercises   Single limb stance at 6" step wtih one foot propped:  alternating arm lifts x 10 reps, then bilat UE lifts holding weighted ball x 10 reps.    Other Standing Exercises Comments  Consecutive forward step taps to 6" step x 10 reps, BUE support, altnernating step taps 1 UE support x 10 reps, then intermittent UE support x 10 reps.  Then lateral step taps to 6" step, 1 UE support x 10 reps.          PT Short Term Goals - 09/05/19 1732      PT  SHORT TERM GOAL #1   Title  Pt will be independent with HEP for improved balance, strength, gait for decreased fall risk.  TARGET for all STGS: 08/24/2019    Time  5    Period  Weeks    Status  Achieved      PT SHORT TERM GOAL #2   Title  Pt will improve 5x sit<>stand to less than or equal to 16 seconds for improved functional lower extremity strength.    Baseline  16.59 sec at best 08/16/2019    Time  5    Period  Weeks    Status  Partially Met      PT SHORT TERM GOAL #3   Title  Pt will improve TUG score to less than or equal to 18 seconds for decreased fall risk.    Baseline  TUG 14 sec 08/16/2019    Time  5    Period  Weeks    Status  Achieved      PT SHORT TERM GOAL #4   Title  Pt will improve DGI score to at least 16/24 for decreased fall risk.    Baseline  15/24 08/16/2019    Time  5    Period  Weeks    Status  Partially Met      PT SHORT TERM GOAL #5   Title  Pt will verbalize understanding of fall prevention in home environment.    Time  5    Period  Weeks    Status  Achieved        PT Long Term Goals - 09/19/19 1230      PT LONG TERM GOAL #1   Title  Pt will be independent with progression of HEP to address strength, balance, and gait for decreased fall risk.  TARGET 09/21/2019    Time  9    Period  Weeks    Status  On-going      PT LONG TERM GOAL #2   Title  Pt will improve 5x sit<>stand to less than or equal to 12.5 seconds for improved functional strength.    Baseline  12.65 09/18/19    Time  9    Period  Weeks     Status  On-going      PT LONG TERM GOAL #3   Title  Pt will improve TUG score to less than or equal to 15 seconds for decreased fall risk.    Baseline  12.93 sec at best, 09/18/19    Time  9    Period  Weeks    Status  Achieved      PT LONG TERM GOAL #4   Title  Pt will improve DGI to at least 19/24 for decreased fall risk.    Time  9    Period  Weeks    Status  On-going      PT LONG TERM GOAL #5   Title  Pt will improve gait velocity to at least 2.3 ft/sec for improved gait efficiency and safety in community.    Baseline  2.71 ft/sec 09/18/2019    Time  9    Period  Weeks    Status  Achieved            Plan - 09/20/19 1134    Clinical Impression Statement  Focus of skilled PT session on functional stregnthening and dynamic balance activities, transitional movements with start/stop and changing directions.   Pt is continuing to wear his sneakers that allow for  more medial protrusion of his feet and reports having used the Darco shoes at home "some."  Cues throughout session to widen BOS, in order to improve balance and offset his feet landing more medially in his current shoes.  Slight improvement in steadiness noted with widened BOS, but he will need more practice.    Personal Factors and Comorbidities  Comorbidity 3+    Comorbidities  DM, pacemaker, lymphedema in BLEs, FAbry's disease, diastolic heart failure, chronic kidney disease, heart failure, Bell's palsy.    Examination-Activity Limitations  Locomotion Level;Transfers    Examination-Participation Restrictions  Community Activity;Shop;Volunteer    Stability/Clinical Decision Making  Evolving/Moderate complexity    Rehab Potential  Good    PT Frequency  Other (comment)   1x/wk for 1 week, then 2x/wk for 8 weeks   PT Duration  Other (comment)   total POC = 9 weeks   PT Treatment/Interventions  ADLs/Self Care Home Management;Gait training;Stair training;Functional mobility training;Therapeutic activities;Therapeutic  exercise;Balance training;Neuromuscular re-education;Patient/family education    PT Next Visit Plan  Static/dynamic balance exercises, transitional movements/change of directions.  Focus on widened BOS with standing and gait for more stability.  ?Walking program for exercise (pt doesn't have a flat outdoor area near home)    Consulted and Agree with Plan of Care  Patient       Patient will benefit from skilled therapeutic intervention in order to improve the following deficits and impairments:  Abnormal gait, Difficulty walking, Decreased balance, Decreased mobility, Decreased strength  Visit Diagnosis: Unsteadiness on feet  Other abnormalities of gait and mobility  Muscle weakness (generalized)     Problem List Patient Active Problem List   Diagnosis Date Noted  . Morbid obesity ([place]) 08/23/2019  . 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 CVA (cerebral vascular accident) ([place]) 10/20/2018  . UTI (urinary tract infection) 10/19/2018  . Upper airway cough syndrome 08/01/2018  . Lymphedema 10/06/2016  . Gout, arthritis   . 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 Onychomycosis of toenail 09/26/2014  . Diabetes mellitus type 2 in obese ([place])   . Iron deficiency anemia 07/29/2014  . CKD (chronic kidney disease), stage III ([place])   . Chronic diastolic HF (heart failure) ([place]) 03/27/2013  . Peripheral edema 07/20/2012  . Obstructive sleep apnea, suspected 06/21/2012  . Pacemaker-St.Jude 02/23/2012  . Hypertension 08/09/2011  . COPD with asthma ([place]) 08/09/2011  . Mobitz type II atrioventricular block 05/19/2011  . 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 09/20/2019, 11:38 AM  Frazier Butt., PT   [facility] [address] [place], [place], [address] Phone: [phone]   Fax:  [phone]  Name: [name] MRN: [id number] Date of Birth: 1960/07/24

## 2019-09-25 ENCOUNTER — Ambulatory Visit: Payer: Medicare HMO | Admitting: Physical Therapy

## 2019-09-25 DIAGNOSIS — E7521 Fabry (-Anderson) disease: Secondary | ICD-10-CM | POA: Diagnosis not present

## 2019-09-27 ENCOUNTER — Encounter: Payer: Self-pay | Admitting: Physical Therapy

## 2019-09-27 ENCOUNTER — Ambulatory Visit: Payer: Medicare HMO | Admitting: Physical Therapy

## 2019-09-27 ENCOUNTER — Other Ambulatory Visit: Payer: Self-pay

## 2019-09-27 DIAGNOSIS — R2681 Unsteadiness on feet: Secondary | ICD-10-CM

## 2019-09-27 DIAGNOSIS — M6281 Muscle weakness (generalized): Secondary | ICD-10-CM | POA: Diagnosis not present

## 2019-09-27 DIAGNOSIS — R2689 Other abnormalities of gait and mobility: Secondary | ICD-10-CM

## 2019-09-27 NOTE — Patient Instructions (Signed)
Added to HEP:  -Walking forward along counter with head turns, head nods, 2-3 reps each.  UE support at counter (Added on [employer])

## 2019-09-27 NOTE — Therapy (Signed)
[facility] [address] [place], [place], [address] Phone: [phone]   Fax:  [phone]  Physical Therapy Treatment/Discharge  Patient Details  Name: [name] MRN: [id number] Date of Birth: 05/05/1961 Referring Provider (PT): Frann Rider, NP   Encounter Date: 09/27/2019  PT End of Session - 09/27/19 1657    Visit Number  14    Number of Visits  18    Date for PT Re-Evaluation  10/21/19    Authorization Type  [employer] Medicare-will need 10th visit progress note    PT Start Time  0935    PT Stop Time  1015    PT Time Calculation (min)  40 min    Equipment Utilized During Treatment  Gait belt    Activity Tolerance  Patient tolerated treatment well    Behavior During Therapy  [facility] for tasks assessed/performed       Past Medical History:  Diagnosis Date  . Bell's palsy   . Bifascicular block   . Chronic bronchitis ([place])    "seasonal; get it q yr"  . Chronic diastolic CHF (congestive heart failure) ([place]) 2010  . Chronic kidney disease (CKD), stage II (mild)    stage II to III/notes 07/23/2014  . Diastolic heart failure secondary to hypertrophic cardiomyopathy ([place]) CARDIOLOGIST-- DR Daneen Schick  . Dyspnea    increased exertion   . Edema 07/2013  . 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 Gout, arthritis 2014   bil feet. right worse  . History of cellulitis of skin with lymphangitis LEFT LEG  . HOCM (hypertrophic obstructive cardiomyopathy) ([place])    a. Echo 11/16: Severe LVH, EF 55-60%, abnormal GLS consistent with HOCM, no SAM, mild LAE  . Hypertension 20 years  . Lymphedema of lower extremity LEFT  >  RIGHT   "using ankle high socks at home; pump doesn't work for me" (07/23/2014)  . Pacemaker    02/12/11  . Pneumonia 08/2011  . Seasonal asthma NO INHALERS   30 years  . Second degree Mobitz II AV block    with syncope, s/p PPM  . Short of breath on exertion   .  Type II diabetes mellitus ([place]) 2012   INSULIN DEPENDENT    Past Surgical History:  Procedure Laterality Date  . CARDIAC CATHETERIZATION  03-12-2011  DR Daneen Schick   HYPERTROPHIC CARDIOMYOPATHY WITH LV CAVITY  APPEARANCE CONSISTANT WITH SIGNIFICANT APICAL HYPERTROPHY/ NORMAL LVSF / EF 55%/ EVIDENCE OF DIASTOLIC DYSFUNCTION WITH EDP OF 23-72mHg AFTER A-WAVE/ NORMAL CORONARY ARTERIES  . EVALUATION UNDER ANESTHESIA WITH FISTULECTOMY N/A 06/23/2015   Procedure: EXAM UNDER ANESTHESIA WITH POSSIBLE FISTULOTOMY;  Surgeon: JJudeth Horn MD;  Location: [facility]  Service: General;  Laterality: N/A;  . HERNIA REPAIR  [id number]  Umbilical Hernia Repair  . INCISION AND DRAINAGE PERIRECTAL ABSCESS Left 07/29/2014   Procedure: IRRIGATION AND DEBRIDEMENT PERIRECTAL ABSCESS;  Surgeon: JDoreen Salvage MD;  [facility]  Service: General;  Laterality: Left;  Prone position  . INSERT / REPLACE / REMOVE PACEMAKER  02/12/2011   SJM implanted by Dr ARayann Hemanfor Mobitz II second degree AV block and syncope  . IRRIGATION AND DEBRIDEMENT ABSCESS N/A 07/27/2013   Procedure: IRRIGATION AND DEBRIDEMENT OF SKIN, SOFT TISSUE AND MUSCLES OF UPPER BACK (11X19X4cm) WITH 10 BLADE AND PULSATILE LAVAGE ;  Surgeon: EGayland Curry MD;  [facility]  Service: General;  Laterality: N/A;  . PPM GENERATOR CHANGEOUT N/A  05/22/2019   Procedure: PPM GENERATOR CHANGEOUT;  Surgeon: Thompson Grayer, MD;  Location: [facility];  Service: Cardiovascular;  Laterality: N/A;  . UMBILICAL HERNIA REPAIR  03/22/2012   Procedure: HERNIA REPAIR UMBILICAL ADULT;  Surgeon: Leighton Ruff, MD;  Location: [facility];  Service: General;  Laterality: N/A;  Umbilical Hernia Repair     There were no vitals filed for this visit.  Subjective Assessment - 09/27/19 0938    Subjective  Don't know what it is about my house, but when I walk up and down my hallway, I feel like a pinball.  I think I'm ready to finish PT today.  Think I have what I need to keep going.     Patient Stated Goals  To work on this off-balance-ness.    Currently in Pain?  No/denies                       [name] Adult PT Treatment/Exercise - 09/27/19 1003      Transfers   Transfers  Sit to Stand;Stand to Sit      Ambulation/Gait   Ambulation/Gait  Yes    Ambulation/Gait Assistance  5: Supervision    Ambulation Distance (Feet)  300 Feet    Assistive device  Straight cane    Gait Pattern  Step-through pattern;Lateral hip instability;Poor foot clearance - left;Poor foot clearance - right;Narrow base of support    Ambulation Surface  Level;Indoor    Gait velocity  14.44 sec = 2.27 ft/sec      Dynamic Gait Index   Level Surface  Mild Impairment    Change in Gait Speed  Mild Impairment    Gait with Horizontal Head Turns  Mild Impairment    Gait with Vertical Head Turns  Mild Impairment    Gait and Pivot Turn  Mild Impairment    Step Over Obstacle  Moderate Impairment    Step Around Obstacles  Mild Impairment    Steps  Mild Impairment    Total Score  15      Self-Care   Self-Care  Other Self-Care Comments    Other Self-Care Comments   Discussed progress towards goals, POC, plans for d/c this visit.  Discussed importance of continueing HEP, walking program and process for f/u therapy if needed in the future.         Therapeutic Exercise Reviewed pt's HEP:   Access Code: [email or web address] URL: [email or web address] Date: 09/05/2019 Prepared by: Mady Haagensen  Exercises  . Standing Hip Abduction with Counter Support - 1 x daily - 5 x weekly - 10 reps - 2 sets (performed 1st set consecutive, 2nd set alternating) . Standing Hip Extension with Counter Support - 1 x daily - 5 x weekly - 10 reps - 2 sets (performed 1st set consecutive, 2nd set alternating) . Single Leg Stance with Support - 1 x daily - 5 x weekly - 3 reps - 1 sets - 10 sec hold (pt able to hold with 1 UE support)-performed 1 rep 10 sec, each leg . Standing Tandem Balance with Counter  Support - 1 x daily - 5 x weekly - 3 reps - 1 sets - 10 sec hold (1-2 UE support, performed 1 rep x 10 sec) . Standing Balance in Corner - 1 x daily - 5 x weekly - 10 reps - 2 sets (performed at counter EO and EC with head turns) . Standing Near Stance in Corner - 1 x daily - 5 x weekly -  10 reps - 2 sets (performed at counter EO and EC with head turns) . Lateral Step Up with Counter Support - 1 x daily - 5 x weekly - 1 sets - 10 reps-verbally reviewed Alternating Step Taps with Counter Support - 1 x daily - 5 x weekly - 2 sets - 10 reps-verbally reviewed  Performed forward walking along counter 4 reps with head turns, then x 2 reps with head nods.  Cues for UE support and for slowed head motions.    PT Education - 09/27/19 1657    Education Details  POC, progress towards goals, continued fall risk; plans for d/c this visit (per pt request)    Person(s) Educated  Patient    Methods  Explanation    Comprehension  Verbalized understanding       PT Short Term Goals - 09/05/19 1732      PT SHORT TERM GOAL #1   Title  Pt will be independent with HEP for improved balance, strength, gait for decreased fall risk.  TARGET for all STGS: 08/24/2019    Time  5    Period  Weeks    Status  Achieved      PT SHORT TERM GOAL #2   Title  Pt will improve 5x sit<>stand to less than or equal to 16 seconds for improved functional lower extremity strength.    Baseline  16.59 sec at best 08/16/2019    Time  5    Period  Weeks    Status  Partially Met      PT SHORT TERM GOAL #3   Title  Pt will improve TUG score to less than or equal to 18 seconds for decreased fall risk.    Baseline  TUG 14 sec 08/16/2019    Time  5    Period  Weeks    Status  Achieved      PT SHORT TERM GOAL #4   Title  Pt will improve DGI score to at least 16/24 for decreased fall risk.    Baseline  15/24 08/16/2019    Time  5    Period  Weeks    Status  Partially Met      PT SHORT TERM GOAL #5   Title  Pt will verbalize  understanding of fall prevention in home environment.    Time  5    Period  Weeks    Status  Achieved        PT Long Term Goals - 09/27/19 1010      PT LONG TERM GOAL #1   Title  Pt will be independent with progression of HEP to address strength, balance, and gait for decreased fall risk.  TARGET 09/21/2019    Time  9    Period  Weeks    Status  Achieved      PT LONG TERM GOAL #2   Title  Pt will improve 5x sit<>stand to less than or equal to 12.5 seconds for improved functional strength.    Baseline  12.65 09/18/19    Time  9    Period  Weeks    Status  Not Met      PT LONG TERM GOAL #3   Title  Pt will improve TUG score to less than or equal to 15 seconds for decreased fall risk.    Baseline  12.93 sec at best, 09/18/19    Time  9    Period  Weeks    Status  Achieved  PT LONG TERM GOAL #4   Title  Pt will improve DGI to at least 19/24 for decreased fall risk.    Baseline  15/24 09/27/2019    Time  9    Period  Weeks    Status  Not Met      PT LONG TERM GOAL #5   Title  Pt will improve gait velocity to at least 2.3 ft/sec for improved gait efficiency and safety in community.    Baseline  2.71 ft/sec 09/18/2019    Time  9    Period  Weeks    Status  Achieved            Plan - 09/27/19 1659    Clinical Impression Statement  Assessed pt's LTGs this visit, as pt feels he has what he needs to continue at home and does not notice an overall improvement in his balance.  Pt has met 3 of 5 LTGs, and he has demonstrated improvement in transfers, balance and TUG scores as compared to evaluation.  He does have continued unsteadiness with gait, and PT has provided education in fall prevention and exercises to address. He is appropriate for d/c at this time.    Personal Factors and Comorbidities  Comorbidity 3+    Comorbidities  DM, pacemaker, lymphedema in BLEs, FAbry's disease, diastolic heart failure, chronic kidney disease, heart failure, Bell's palsy.     Examination-Activity Limitations  Locomotion Level;Transfers    Examination-Participation Restrictions  Community Activity;Shop;Volunteer    Stability/Clinical Decision Making  Evolving/Moderate complexity    Rehab Potential  Good    PT Frequency  Other (comment)   1x/wk for 1 week, then 2x/wk for 8 weeks   PT Duration  Other (comment)   total POC = 9 weeks   PT Treatment/Interventions  ADLs/Self Care Home Management;Gait training;Stair training;Functional mobility training;Therapeutic activities;Therapeutic exercise;Balance training;Neuromuscular re-education;Patient/family education    PT Next Visit Plan  D/C this visit.    Consulted and Agree with Plan of Care  Patient       Patient will benefit from skilled therapeutic intervention in order to improve the following deficits and impairments:  Abnormal gait, Difficulty walking, Decreased balance, Decreased mobility, Decreased strength  Visit Diagnosis: Unsteadiness on feet  Other abnormalities of gait and mobility     Problem List Patient Active Problem List   Diagnosis Date Noted  . Morbid obesity ([place]) 08/23/2019  . 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 CVA (cerebral vascular accident) ([place]) 10/20/2018  . UTI (urinary tract infection) 10/19/2018  . Upper airway cough syndrome 08/01/2018  . Lymphedema 10/06/2016  . Gout, arthritis   . 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 Onychomycosis of toenail 09/26/2014  . Diabetes mellitus type 2 in obese ([place])   . Iron deficiency anemia 07/29/2014  . CKD (chronic kidney disease), stage III ([place])   . Chronic diastolic HF (heart failure) ([place]) 03/27/2013  . Peripheral edema 07/20/2012  . Obstructive sleep apnea, suspected 06/21/2012  . Pacemaker-[employer] 02/23/2012  . Hypertension 08/09/2011  . COPD with asthma ([employer]) 08/09/2011  . Mobitz type II atrioventricular block 05/19/2011  . 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[place]) 05/19/2011    Levy Cedano W. 09/27/2019, 5:05 PM  Frazier Butt., PT   [facility] [address] [place], [place], [address] Phone: [phone]   Fax:  [phone]  Name: [name] MRN: [id number] Date of Birth: [birth year]   PHYSICAL THERAPY DISCHARGE SUMMARY  Visits from Start of Care: 14  Current functional level  related to goals / functional outcomes: PT Long Term Goals - 09/27/19 1010      PT LONG TERM GOAL #1   Title  Pt will be independent with progression of HEP to address strength, balance, and gait for decreased fall risk.  TARGET 09/21/2019    Time  9    Period  Weeks    Status  Achieved      PT LONG TERM GOAL #2   Title  Pt will improve 5x sit<>stand to less than or equal to 12.5 seconds for improved functional strength.    Baseline  12.65 09/18/19    Time  9    Period  Weeks    Status  Not Met      PT LONG TERM GOAL #3   Title  Pt will improve TUG score to less than or equal to 15 seconds for decreased fall risk.    Baseline  12.93 sec at best, 09/18/19    Time  9    Period  Weeks    Status  Achieved      PT LONG TERM GOAL #4   Title  Pt will improve DGI to at least 19/24 for decreased fall risk.    Baseline  15/24 09/27/2019    Time  9    Period  Weeks    Status  Not Met      PT LONG TERM GOAL #5   Title  Pt will improve gait velocity to at least 2.3 ft/sec for improved gait efficiency and safety in community.    Baseline  2.71 ft/sec 09/18/2019    Time  9    Period  Weeks    Status  Achieved      Pt has met 3 of 5 LTGs.   Remaining deficits: High level balance, fall risk per DGI   Education / Equipment: HEP, fall prevention  Plan: Patient agrees to discharge.  Patient goals were partially met. Patient is being discharged due to the patient's request.  ?????     Mady Haagensen, PT 09/27/19 5:08 PM Phone: [phone] Fax: [phone]

## 2019-10-02 ENCOUNTER — Ambulatory Visit: Payer: Medicare HMO | Admitting: Physical Therapy

## 2019-10-04 ENCOUNTER — Ambulatory Visit: Payer: Medicare HMO | Admitting: Physical Therapy

## 2019-10-08 ENCOUNTER — Ambulatory Visit: Payer: Medicare HMO | Attending: Internal Medicine

## 2019-10-08 DIAGNOSIS — Z23 Encounter for immunization: Secondary | ICD-10-CM

## 2019-10-08 NOTE — Progress Notes (Signed)
   Covid-19 Vaccination Clinic  Name:  [name]    MRN: [id number] DOB: 04-Jun-1961  10/08/2019  [name] was observed post Covid-19 immunization for 15 minutes without incident. He was provided with Vaccine Information Sheet and instruction to access the V-Safe system.   [name] was instructed to call 911 with any severe reactions post vaccine: [name] Kitchen Difficulty breathing  . Swelling of face and throat  . A fast heartbeat  . A bad rash all over body  . Dizziness and weakness   Immunizations Administered    Name Date Dose VIS Date Route   Pfizer COVID-19 Vaccine 10/08/2019 12:13 PM 0.3 mL 08/01/2018 Intramuscular   Manufacturer: [place]   Lot: [id number]   NDC: [address]-7    stayed 30 minutes in observation

## 2019-10-09 DIAGNOSIS — E7521 Fabry (-Anderson) disease: Secondary | ICD-10-CM | POA: Diagnosis not present

## 2019-10-11 ENCOUNTER — Ambulatory Visit: Payer: Medicare HMO | Attending: Internal Medicine | Admitting: Internal Medicine

## 2019-10-11 ENCOUNTER — Encounter: Payer: Self-pay | Admitting: Internal Medicine

## 2019-10-11 ENCOUNTER — Other Ambulatory Visit: Payer: Self-pay

## 2019-10-11 VITALS — BP 137/87 | HR 72 | Temp 97.7°F | Resp 16 | Wt 299.4 lb

## 2019-10-11 DIAGNOSIS — I10 Essential (primary) hypertension: Secondary | ICD-10-CM | POA: Diagnosis not present

## 2019-10-11 DIAGNOSIS — Z794 Long term (current) use of insulin: Secondary | ICD-10-CM

## 2019-10-11 DIAGNOSIS — L97929 Non-pressure chronic ulcer of unspecified part of left lower leg with unspecified severity: Secondary | ICD-10-CM | POA: Diagnosis not present

## 2019-10-11 DIAGNOSIS — Z6841 Body Mass Index (BMI) 40.0 and over, adult: Secondary | ICD-10-CM | POA: Diagnosis not present

## 2019-10-11 DIAGNOSIS — I890 Lymphedema, not elsewhere classified: Secondary | ICD-10-CM

## 2019-10-11 DIAGNOSIS — L97919 Non-pressure chronic ulcer of unspecified part of right lower leg with unspecified severity: Secondary | ICD-10-CM

## 2019-10-11 DIAGNOSIS — E1159 Type 2 diabetes mellitus with other circulatory complications: Secondary | ICD-10-CM | POA: Diagnosis not present

## 2019-10-11 LAB — GLUCOSE, POCT (MANUAL RESULT ENTRY): POC Glucose: 120 mg/dl — AB (ref 70–99)

## 2019-10-11 NOTE — Progress Notes (Signed)
[name], male    DOB: 09-15-1960  MRN: [id number]  CC: Diabetes and Hypertension   Subjective: [name] is a 59 y.o. male who presents for f/u 6 wks f/u DM and foot ulcers His concerns today include:  Patient with history of diabetes type 2 with microalbuminuria, diastolic CHF with pacemaker (secconary to AV block with syncopy), CKD stage III, HOCM, HTN, HL,CVA,lymphedema. 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  Ulcers: He has been seeing Dr. Dellia [name] at the wound care.  He reports healing.  He has not started wrapping his legs and using his compression stockings as yet.  He wants to get his toenails clipped before starting to use the compression socks again.  He has an appointment with podiatry on the 12th of this month.  States he was given a material to put between the fold separating the leg from the foot when he starts wearing his compression socks again so that the ulcers do not recur. Raised spots on legs x 4 yrs. Wonders if keloid.  Shrinks when he wraps   DM: checking TID but got distracted with a phone call from his sister this morning and left log on his table Over past 1 wk, morning avg BS in mid 120s, before lunch 120-130s and dinner 120s -snacking mainly on fruits.  Rarely a slice of cake.  Drinks mainly water, no juices  HTN:  Took meds already this a.m.  Limits salt in foods. No device to check BP.  BP check 2 days go at his infusion and SBP 113. Bp on last visit to Wound Clinic was 124/63  Was seeing P.T to help dec feeling off balance.  He did well with therapy and has been discharged. Patient Active Problem List   Diagnosis Date Noted  . Morbid obesity ([place]) 08/23/2019  . 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 CVA (cerebral vascular accident) ([place]) 10/20/2018  . UTI (urinary tract infection) 10/19/2018  . Upper airway cough syndrome 08/01/2018  . Lymphedema 10/06/2016  . Gout, arthritis   . Anorectal  fistula 06/23/2015  . Onychomycosis of toenail 09/26/2014  . Diabetes mellitus type 2 in obese ([place])   . Iron deficiency anemia 07/29/2014  . CKD (chronic kidney disease), stage III ([place])   . Chronic diastolic HF (heart failure) ([place]) 03/27/2013  . Peripheral edema 07/20/2012  . Obstructive sleep apnea, suspected 06/21/2012  . Pacemaker-St.Jude 02/23/2012  . Hypertension 08/09/2011  . COPD with asthma ([place]) 08/09/2011  . Mobitz type II atrioventricular block 05/19/2011  . 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[place]) 05/19/2011     Current Outpatient Medications on File Prior to Visit  Medication Sig Dispense Refill  . Accu-Chek FastClix Lancets MISC 1 each by Other route 3 (three) times daily. 102 each 12  . acetaminophen (TYLENOL) 325 MG tablet Take 1-2 tablets (325-650 mg total) by mouth every 4 (four) hours as needed for mild pain.    . Agalsidase beta (FABRAZYME IV) Inject 115 mg into the vein every 14 (fourteen) days.     [name] Kitchen albuterol (PROAIR HFA) 108 (90 Base) MCG/ACT inhaler Inhale 1 puff into the lungs every 6 (six) hours as needed for wheezing or shortness of breath. 8.5 Inhaler 1  . allopurinol (ZYLOPRIM) 100 MG tablet TAKE 3 TABLETS BY MOUTH EVERY DAY 90 tablet 1  . amLODipine (NORVASC) 10 MG tablet TAKE 1 TABLET BY MOUTH EVERY DAY 90 tablet 2  . atorvastatin (LIPITOR) 20 MG tablet Take 20  mg by mouth daily.    [name] Kitchen atorvastatin (LIPITOR) 80 MG tablet TAKE 1 TABLET (80 MG TOTAL) BY MOUTH DAILY AT 6 PM. 90 tablet 2  . clopidogrel (PLAVIX) 75 MG tablet TAKE 1 TABLET BY MOUTH EVERY DAY 90 tablet 2  . fexofenadine (ALLEGRA) 180 MG tablet Take 1 tablet (180 mg total) by mouth daily. 30 tablet 3  . fluticasone (FLONASE) 50 MCG/ACT nasal spray Place 1 spray into both nostrils daily. (Patient taking differently: Place 1 spray into both nostrils daily as needed for allergies. ) 16 g 1  . furosemide (LASIX) 20 MG tablet TAKE 1 TABLET BY MOUTH EVERY DAY 90 tablet 2  . glucose blood (ACCU-CHEK AVIVA PLUS) test strip  1 each by Other route 3 (three) times daily. Use as instructed 100 each 12  . insulin aspart protamine - aspart (NOVOLOG MIX 70/30 FLEXPEN) (70-30) 100 UNIT/ML FlexPen Inject 0.28 mLs (28 Units total) into the skin 2 (two) times daily. 15 mL 11  . Insulin Pen Needle (PX SHORTLENGTH PEN NEEDLES) 31G X 8 MM MISC 1 each by Does not apply route 2 (two) times daily before a meal. 100 each 8  . losartan-hydrochlorothiazide (HYZAAR) 50-12.5 MG tablet Take 1 tablet by mouth daily. Must have office visit for refills 90 tablet 0  . mupirocin ointment (BACTROBAN) 2 % Apply to affected area daily with dressing change. 30 g 0  . OLIVE LEAF EXTRACT PO Take 1 capsule by mouth daily.     No current facility-administered medications on file prior to visit.    Allergies  Allergen Reactions  . Lisinopril Anaphylaxis  . Shellfish Allergy Hives and Swelling    Social History   Socioeconomic History  . Marital status: Divorced    Spouse name: Not on file  . Number of children: Not on file  . Years of education: Not on file  . Highest education level: Not on file  Occupational History  . Not on file  Tobacco Use  . Smoking status: Never Smoker  . Smokeless tobacco: Never Used  Substance and Sexual Activity  . Alcohol use: Yes    Comment: 07/23/2014 "might have a few drinks on holidays or at cookouts"  . Drug use: No  . Sexual activity: Yes  Other Topics Concern  . 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 Difficulty of Paying Living Expenses:   Food Insecurity: No Food Insecurity  . Worried About Charity fundraiser in the Last Year: Never true  . Ran Out of Food in the Last Year: Never true  Transportation Needs: No Transportation Needs  . Lack of Transportation (Medical): No  . Lack of Transportation (Non-Medical): No  Physical Activity: Insufficiently Active  . Days of Exercise per Week: 2 days  .  Minutes of Exercise per Session: 20 min  Stress: No Stress Concern Present  . Feeling of Stress : Only a little  Social Connections: Slightly Isolated  . Frequency of Communication with Friends and Family: Twice a week  . Frequency of Social Gatherings with Friends and Family: Once a week  . Attends Religious Services: More than 4 times per year  . Active Member of Clubs or Organizations: Yes  . Attends Archivist Meetings: 1 to 4 times per year  . Marital Status: Divorced  Human resources officer Violence: Not At Risk  . Fear of Current or Ex-Partner:  No  . Emotionally Abused: No  . Physically Abused: No  . Sexually Abused: No    Family History  Problem Relation Age of Onset  . Stroke Mother   . Fabry's disease Mother   . Fabry's disease Brother   . Colon cancer Neg Hx     Past Surgical History:  Procedure Laterality Date  . CARDIAC CATHETERIZATION  03-12-2011  DR Daneen Schick   HYPERTROPHIC CARDIOMYOPATHY WITH LV CAVITY  APPEARANCE CONSISTANT WITH SIGNIFICANT APICAL HYPERTROPHY/ NORMAL LVSF / EF 55%/ EVIDENCE OF DIASTOLIC DYSFUNCTION WITH EDP OF 23-1mmHg AFTER A-WAVE/ NORMAL CORONARY ARTERIES  . EVALUATION UNDER ANESTHESIA WITH FISTULECTOMY N/A 06/23/2015   Procedure: EXAM UNDER ANESTHESIA WITH POSSIBLE FISTULOTOMY;  Surgeon: Judeth Horn, MD;  Location: [facility];  Service: General;  Laterality: N/A;  . HERNIA REPAIR  [id number]   Umbilical Hernia Repair  . INCISION AND DRAINAGE PERIRECTAL ABSCESS Left 07/29/2014   Procedure: IRRIGATION AND DEBRIDEMENT PERIRECTAL ABSCESS;  Surgeon: Doreen Salvage, MD;  Location: [place] [place];  Service: General;  Laterality: Left;  Prone position  . INSERT / REPLACE / REMOVE PACEMAKER  02/12/2011   SJM implanted by Dr Rayann Heman for Mobitz II second degree AV block and syncope  . IRRIGATION AND DEBRIDEMENT ABSCESS N/A 07/27/2013   Procedure: IRRIGATION AND DEBRIDEMENT OF SKIN, SOFT TISSUE AND MUSCLES OF UPPER BACK (11X19X4cm) WITH 10 BLADE AND PULSATILE LAVAGE ;  Surgeon:  Gayland Curry, MD;  Location: [place] [facility];  Service: General;  Laterality: N/A;  . PPM GENERATOR CHANGEOUT N/A 05/22/2019   Procedure: PPM GENERATOR CHANGEOUT;  Surgeon: Thompson Grayer, MD;  Location: [facility];  Service: Cardiovascular;  Laterality: N/A;  . UMBILICAL HERNIA REPAIR  03/22/2012   Procedure: HERNIA REPAIR UMBILICAL ADULT;  Surgeon: Leighton Ruff, MD;  Location: [facility];  Service: General;  Laterality: N/A;  Umbilical Hernia Repair     ROS: Review of Systems Negative except as stated above  PHYSICAL EXAM: BP 137/87   Pulse 72   Temp 97.7 F (36.5 C)   Resp 16   Wt 299 lb 6.4 oz (135.8 kg)   SpO2 97%   BMI 42.96 kg/m   Wt Readings from Last 3 Encounters:  10/11/19 299 lb 6.4 oz (135.8 kg)  09/05/19 284 lb (128.8 kg)  08/23/19 294 lb 12.8 oz (133.7 kg)   BP 142/82 Physical Exam  General appearance - alert, well appearing, and in no distress Mental status - normal mood, behavior, speech, dress, motor activity, and thought processes Chest - clear to auscultation, no wheezes, rales or rhonchi, symmetric air entry Heart - normal rate, regular rhythm, normal S1, S2, no murmurs, rubs, clicks or gallops Extremities -significant lymphedema in both legs and feet.  Raise scattered areas of skin that look like blisters but are not blisters. Skin -also has between skin fold of lower legs and feet are healed CMP Latest Ref Rng & Units 08/23/2019 05/22/2019 05/22/2019  Glucose 65 - 99 mg/dL 147(H) 133(H) 138(H)  BUN 6 - 24 mg/dL 32(H) 36(H) 34(H)  Creatinine 0.76 - 1.27 mg/dL 2.02(H) 2.00(H) 2.23(H)  Sodium 134 - 144 mmol/L 143 142 140  Potassium 3.5 - 5.2 mmol/L 4.6 4.0 6.6(HH)  Chloride 96 - 106 mmol/L 105 107 106  CO2 20 - 29 mmol/L 21 - 21(L)  Calcium 8.7 - 10.2 mg/dL 9.8 - 9.1  Total Protein 6.0 - 8.5 g/dL 7.5 - -  Total Bilirubin 0.0 - 1.2 mg/dL 0.4 - -  Alkaline Phos 39 - 117  IU/L 126(H) - -  AST 0 - 40 IU/L 41(H) - -  ALT 0 - 44 IU/L 43 - -   Lipid Panel       Component Value Date/Time   CHOL 159 08/23/2019 1152   TRIG 63 08/23/2019 1152   HDL 59 08/23/2019 1152   CHOLHDL 2.7 08/23/2019 1152   CHOLHDL 4.4 10/20/2018 0324   VLDL 18 10/20/2018 0324   LDLCALC 87 08/23/2019 1152    CBC    Component Value Date/Time   WBC 7.4 08/23/2019 1152   WBC 7.0 05/22/2019 1201   RBC 5.09 08/23/2019 1152   RBC 5.29 05/22/2019 1201   HGB 14.7 08/23/2019 1152   HCT 44.2 08/23/2019 1152   PLT 193 05/22/2019 1201   PLT 221 02/13/2019 0854   MCV 87 08/23/2019 1152   MCH 28.9 08/23/2019 1152   MCH 29.3 05/22/2019 1201   MCHC 33.3 08/23/2019 1152   MCHC 32.1 05/22/2019 1201   RDW 13.2 08/23/2019 1152   LYMPHSABS 2.9 08/23/2019 1152   MONOABS 1.3 (H) 10/24/2018 0515   EOSABS 0.3 08/23/2019 1152   BASOSABS 0.1 08/23/2019 1152    ASSESSMENT AND PLAN: 1. Type 2 diabetes mellitus with other circulatory complication, with long-term current use of insulin ([place]) Reported blood sugars are good.  He will continue current dose of NovoLog 70/30 28 units twice a day.  Encouraged him to continue to try to eat healthy.  Bring in log readings with him on next visit. - POCT glucose (manual entry)  2. Lymphedema Patient to restart wrapping and use of his compression stockings.  Advised against leaving them on for days at a time without taking them off to prevent future occurrence of skin breakdown  3. Ulcers of both lower legs (HCC) Healed.  4. Class 3 severe obesity due to excess calories with serious comorbidity and body mass index (BMI) of 40.0 to 44.9 in adult [facility]) Dietary counseling given.  5. Essential hypertension Elevated today but blood pressure has been at goal on visits with other providers in the health system as noted on review of the flow chart.     Patient was given the opportunity to ask questions.  Patient verbalized understanding of the plan and was able to repeat key elements of the plan.   Orders Placed This Encounter  Procedures  .  POCT glucose (manual entry)     Requested Prescriptions    No prescriptions requested or ordered in this encounter    Return in about 2 months (around 12/11/2019).  Karle Plumber, MD, FACP

## 2019-10-11 NOTE — Patient Instructions (Addendum)
You should check with your insurance to see whether they would pay for you to get a home blood pressure monitoring device.  Please bring a log of your blood sugar readings with you on your next visit.  When you start wrapping your legs, make sure that you are not leaving your compression socks on for days without taking off.    Diabetes Mellitus and Nutrition, Adult When you have diabetes (diabetes mellitus), it is very important to have healthy eating habits because your blood sugar (glucose) levels are greatly affected by what you eat and drink. Eating healthy foods in the appropriate amounts, at about the same times every day, can help you:  Control your blood glucose.  Lower your risk of heart disease.  Improve your blood pressure.  Reach or maintain a healthy weight. Every person with diabetes is different, and each person has different needs for a meal plan. Your health care provider may recommend that you work with a diet and nutrition specialist (dietitian) to make a meal plan that is best for you. Your meal plan may vary depending on factors such as:  The calories you need.  The medicines you take.  Your weight.  Your blood glucose, blood pressure, and cholesterol levels.  Your activity level.  Other health conditions you have, such as heart or kidney disease. How do carbohydrates affect me? Carbohydrates, also called carbs, affect your blood glucose level more than any other type of food. Eating carbs naturally raises the amount of glucose in your blood. Carb counting is a method for keeping track of how many carbs you eat. Counting carbs is important to keep your blood glucose at a healthy level, especially if you use insulin or take certain oral diabetes medicines. It is important to know how many carbs you can safely have in each meal. This is different for every person. Your dietitian can help you calculate how many carbs you should have at each meal and for each  snack. Foods that contain carbs include:  Bread, cereal, rice, pasta, and crackers.  Potatoes and corn.  Peas, beans, and lentils.  Milk and yogurt.  Fruit and juice.  Desserts, such as cakes, cookies, ice cream, and candy. How does alcohol affect me? Alcohol can cause a sudden decrease in blood glucose (hypoglycemia), especially if you use insulin or take certain oral diabetes medicines. Hypoglycemia can be a life-threatening condition. Symptoms of hypoglycemia (sleepiness, dizziness, and confusion) are similar to symptoms of having too much alcohol. If your health care provider says that alcohol is safe for you, follow these guidelines:  Limit alcohol intake to no more than 1 drink per day for nonpregnant women and 2 drinks per day for men. One drink equals 12 oz of beer, 5 oz of wine, or 1 oz of hard liquor.  Do not drink on an empty stomach.  Keep yourself hydrated with water, diet soda, or unsweetened iced tea.  Keep in mind that regular soda, juice, and other mixers may contain a lot of sugar and must be counted as carbs. What are tips for following this plan?  Reading food labels  Start by checking the serving size on the "Nutrition Facts" label of packaged foods and drinks. The amount of calories, carbs, fats, and other nutrients listed on the label is based on one serving of the item. Many items contain more than one serving per package.  Check the total grams (g) of carbs in one serving. You can calculate the number of  servings of carbs in one serving by dividing the total carbs by 15. For example, if a food has 30 g of total carbs, it would be equal to 2 servings of carbs.  Check the number of grams (g) of saturated and trans fats in one serving. Choose foods that have low or no amount of these fats.  Check the number of milligrams (mg) of salt (sodium) in one serving. Most people should limit total sodium intake to less than 2,300 mg per day.  Always check the  nutrition information of foods labeled as "low-fat" or "nonfat". These foods may be higher in added sugar or refined carbs and should be avoided.  Talk to your dietitian to identify your daily goals for nutrients listed on the label. Shopping  Avoid buying canned, premade, or processed foods. These foods tend to be high in fat, sodium, and added sugar.  Shop around the outside edge of the grocery store. This includes fresh fruits and vegetables, bulk grains, fresh meats, and fresh dairy. Cooking  Use low-heat cooking methods, such as baking, instead of high-heat cooking methods like deep frying.  Cook using healthy oils, such as olive, canola, or sunflower oil.  Avoid cooking with butter, cream, or high-fat meats. Meal planning  Eat meals and snacks regularly, preferably at the same times every day. Avoid going long periods of time without eating.  Eat foods high in fiber, such as fresh fruits, vegetables, beans, and whole grains. Talk to your dietitian about how many servings of carbs you can eat at each meal.  Eat 4-6 ounces (oz) of lean protein each day, such as lean meat, chicken, fish, eggs, or tofu. One oz of lean protein is equal to: ? 1 oz of meat, chicken, or fish. ? 1 egg. ?  cup of tofu.  Eat some foods each day that contain healthy fats, such as avocado, nuts, seeds, and fish. Lifestyle  Check your blood glucose regularly.  Exercise regularly as told by your health care provider. This may include: ? 150 minutes of moderate-intensity or vigorous-intensity exercise each week. This could be brisk walking, biking, or water aerobics. ? Stretching and doing strength exercises, such as yoga or weightlifting, at least 2 times a week.  Take medicines as told by your health care provider.  Do not use any products that contain nicotine or tobacco, such as cigarettes and e-cigarettes. If you need help quitting, ask your health care provider.  Work with a Social worker or diabetes  educator to identify strategies to manage stress and any emotional and social challenges. Questions to ask a health care provider  Do I need to meet with a diabetes educator?  Do I need to meet with a dietitian?  What number can I call if I have questions?  When are the best times to check my blood glucose? Where to find more information:  American Diabetes Association: diabetes.org  Academy of Nutrition and Dietetics: www.eatright.[employer] of Diabetes and Digestive and Kidney Diseases (NIH): [employer] Summary  A healthy meal plan will help you control your blood glucose and maintain a healthy lifestyle.  Working with a diet and nutrition specialist (dietitian) can help you make a meal plan that is best for you.  Keep in mind that carbohydrates (carbs) and alcohol have immediate effects on your blood glucose levels. It is important to count carbs and to use alcohol carefully. This information is not intended to replace advice given to you by your health care provider. Make  sure you discuss any questions you have with your health care provider. Document Revised: 05/06/2017 Document Reviewed: 06/28/2016 Elsevier Patient Education  2020 [employer].

## 2019-10-13 ENCOUNTER — Other Ambulatory Visit: Payer: Self-pay | Admitting: Internal Medicine

## 2019-10-13 DIAGNOSIS — I10 Essential (primary) hypertension: Secondary | ICD-10-CM

## 2019-10-14 ENCOUNTER — Encounter: Payer: Self-pay | Admitting: Internal Medicine

## 2019-10-16 ENCOUNTER — Other Ambulatory Visit: Payer: Self-pay | Admitting: Internal Medicine

## 2019-10-17 ENCOUNTER — Other Ambulatory Visit: Payer: Self-pay

## 2019-10-17 ENCOUNTER — Encounter: Payer: Self-pay | Admitting: Podiatry

## 2019-10-17 ENCOUNTER — Ambulatory Visit (INDEPENDENT_AMBULATORY_CARE_PROVIDER_SITE_OTHER): Payer: Medicare HMO | Admitting: Podiatry

## 2019-10-17 DIAGNOSIS — E669 Obesity, unspecified: Secondary | ICD-10-CM | POA: Diagnosis not present

## 2019-10-17 DIAGNOSIS — M79674 Pain in right toe(s): Secondary | ICD-10-CM | POA: Diagnosis not present

## 2019-10-17 DIAGNOSIS — B351 Tinea unguium: Secondary | ICD-10-CM | POA: Diagnosis not present

## 2019-10-17 DIAGNOSIS — M79675 Pain in left toe(s): Secondary | ICD-10-CM

## 2019-10-17 DIAGNOSIS — E1169 Type 2 diabetes mellitus with other specified complication: Secondary | ICD-10-CM | POA: Diagnosis not present

## 2019-10-17 NOTE — Progress Notes (Signed)
This patient returns to my office for at risk foot care.  This patient requires this care by a professional since this patient will be at risk due to having diabetes lymphedema and coagulation defect due to taking plavix. This patient is unable to cut nails himself since the patient cannot reach his nails.These nails are painful walking and wearing shoes.  This patient presents for at risk foot care today.  General Appearance  Alert, conversant and in no acute stress.  Vascular  Dorsalis pedis and posterior tibial  pulses are not  palpable  Bilaterally due to severe swelling from lymphedema..  Capillary return is within normal limits  bilaterally. Temperature is within normal limits  bilaterally.  Neurologic  Senn-Weinstein monofilament wire test within normal limits  bilaterally. Muscle power within normal limits bilaterally.  Nails Thick disfigured discolored nails with subungual debris  Hallux nails  B/l. No evidence of bacterial infection or drainage bilaterally.  Orthopedic  No limitations of motion  feet .  No crepitus or effusions noted.  No bony pathology or digital deformities noted.  Skin  normotropic skin with no porokeratosis noted bilaterally.  No signs of infections or ulcers noted.     Onychomycosis  Pain in right toes  Pain in left toes  Consent was obtained for treatment procedures.   Mechanical debridement of nails 1-5  bilaterally performed with a nail nipper.  Filed with dremel without incident. Cauterization right hallux was performed.   Return office visit  3 months                   Told patient to return for periodic foot care and evaluation due to potential at risk complications.   Gardiner Barefoot DPM

## 2019-10-23 DIAGNOSIS — E7521 Fabry (-Anderson) disease: Secondary | ICD-10-CM | POA: Diagnosis not present

## 2019-11-01 DIAGNOSIS — E118 Type 2 diabetes mellitus with unspecified complications: Secondary | ICD-10-CM | POA: Diagnosis not present

## 2019-11-01 DIAGNOSIS — I639 Cerebral infarction, unspecified: Secondary | ICD-10-CM | POA: Diagnosis not present

## 2019-11-01 DIAGNOSIS — I890 Lymphedema, not elsewhere classified: Secondary | ICD-10-CM | POA: Diagnosis not present

## 2019-11-01 DIAGNOSIS — N1831 Chronic kidney disease, stage 3a: Secondary | ICD-10-CM | POA: Diagnosis not present

## 2019-11-01 DIAGNOSIS — I441 Atrioventricular block, second degree: Secondary | ICD-10-CM | POA: Diagnosis not present

## 2019-11-01 DIAGNOSIS — E7521 Fabry (-Anderson) disease: Secondary | ICD-10-CM | POA: Diagnosis not present

## 2019-11-01 DIAGNOSIS — I129 Hypertensive chronic kidney disease with stage 1 through stage 4 chronic kidney disease, or unspecified chronic kidney disease: Secondary | ICD-10-CM | POA: Diagnosis not present

## 2019-11-01 DIAGNOSIS — M109 Gout, unspecified: Secondary | ICD-10-CM | POA: Diagnosis not present

## 2019-11-03 ENCOUNTER — Other Ambulatory Visit: Payer: Self-pay | Admitting: Internal Medicine

## 2019-11-06 DIAGNOSIS — E7521 Fabry (-Anderson) disease: Secondary | ICD-10-CM | POA: Diagnosis not present

## 2019-11-20 DIAGNOSIS — E7521 Fabry (-Anderson) disease: Secondary | ICD-10-CM | POA: Diagnosis not present

## 2019-11-29 ENCOUNTER — Other Ambulatory Visit: Payer: Self-pay | Admitting: Internal Medicine

## 2019-11-29 DIAGNOSIS — I10 Essential (primary) hypertension: Secondary | ICD-10-CM

## 2019-12-04 DIAGNOSIS — E7521 Fabry (-Anderson) disease: Secondary | ICD-10-CM | POA: Diagnosis not present

## 2019-12-06 ENCOUNTER — Encounter: Payer: Self-pay | Admitting: Internal Medicine

## 2019-12-14 ENCOUNTER — Ambulatory Visit: Payer: Medicare HMO | Attending: Internal Medicine | Admitting: Family

## 2019-12-14 ENCOUNTER — Other Ambulatory Visit: Payer: Self-pay

## 2019-12-14 DIAGNOSIS — Z794 Long term (current) use of insulin: Secondary | ICD-10-CM

## 2019-12-14 DIAGNOSIS — E1159 Type 2 diabetes mellitus with other circulatory complications: Secondary | ICD-10-CM

## 2019-12-14 DIAGNOSIS — I890 Lymphedema, not elsewhere classified: Secondary | ICD-10-CM | POA: Diagnosis not present

## 2019-12-14 DIAGNOSIS — I10 Essential (primary) hypertension: Secondary | ICD-10-CM | POA: Diagnosis not present

## 2019-12-14 NOTE — Progress Notes (Signed)
Pt states his blood sugar this morning was 120

## 2019-12-14 NOTE — Progress Notes (Signed)
Virtual Visit via Telephone Note  I connected with [name], on 12/14/2019 at 11:20 AM by telephone due to the COVID-19 pandemic and verified that I am speaking with the correct person using two identifiers.  Due to current restrictions/limitations of in-office visits due to the COVID-19 pandemic, this scheduled clinical appointment was converted to a telehealth visit.   Consent: I discussed the limitations, risks, security and privacy concerns of performing an evaluation and management service by telephone and the availability of in person appointments. I also discussed with the patient that there may be a patient responsible charge related to this service. The patient expressed understanding and agreed to proceed.  Location of Patient: Home  Location of Provider: [place]   Persons participating in Telemedicine visit: [place], [place], [place]  History of Present Illness: [name] is a 59 year old male with history of Fabry disease, hypertension, chronic diastolic heart failure, cerebral vascular accident, COPD with asthma, diabetes mellitus type 2 in obese, chronic kidney disease, and morbid obesity who presents for chronic conditions follow-up.       1. DIABETES TYPE 2 FOLLOW-UP: Last A1C:   Results for orders placed or performed in visit on 10/11/19  POCT glucose (manual entry)  Result Value Ref Range   POC Glucose 120 (A) 70 - 99 mg/dl    Med Adherence:  [x]  Yes    []  No Medication side effects:  []  Yes    [x]  No Home Monitoring?  [x]  Yes    []  No Home glucose results range: 120's Diet Adherence: [x]  Yes    []  No Exercise: [x]  Yes, trying to stay active  Hypoglycemic episodes?: []  Yes    [x]  No Numbness of the feet? []  Yes    [x]  No Retinopathy hx? []  Yes    [x]  No Last eye exam: July 2020   To achieve an A1C goal of less than or equal to 7.0 percent, a fasting blood sugar of 80 to 130 mg/dL and  a postprandial glucose (90 to 120 minutes after a meal) less than 180 mg/dL. In the event of sugars less than 60 mg/dl or greater than 400 mg/dl please notify the clinic ASAP. It is recommended that you undergo annual eye exams and annual foot exams.  2. HYPERTENSION FOLLOW-UP:  Currently taking: see medication list Med Adherence: [x]  Yes    []  No Medication side effects: []  Yes    [x]  No Adherence with salt restriction: [x]  Yes , trying  Exercise: Yes [x]  trying to stay active Home Monitoring?: []  Yes    [x]  No Monitoring Frequency: []  Yes    [x]  No Home BP results range: []  Yes    [x]  No Smoking []  Yes [x]  No  SOB? [x]  Yes, seasonal leg swelling  Chest Pain?: []  Yes    [x]  No  Leg swelling?: [x]  Yes, related to lymhedema Headaches?: []  Yes    [x]  No Dizziness? [x]  Yes, was told this is a side effect of history of stroke  Comments: Last visit 10/11/2019 with Dr. Wynetta Emery. During that encounter patient with elevated blood pressure.  3. LYMPHEDEMA FOLLOW-UP: Last visit 10/11/2019 with Dr. Wynetta Emery. During that encounter patient advised to restart wrapping and using compression stockings. Advised against leaving them on for days at a time without taking them off to prevent future occurrence of skin breakdown.  Today reports he is still wearing bilateral compression stockings. Reports he does not wear them everyday in  order to let his legs get some air. Reports his concern is that when he places compression stockings on that the left lateral foot bleeds and has an odor. Denies injuring and trauma to the foot. Reports he has been changing the dressing using gauze and tape and typically leaves the dressings on for 2 days before changing. Reports he has seen wound care, Dr. Dellia Nims, in the past for similar reasons.   Past Medical History:  Diagnosis Date  . Bell's palsy   . Bifascicular block   . Chronic bronchitis ([place])    "seasonal; get it q yr"  . Chronic diastolic CHF (congestive heart failure)  ([place]) 2010  . Chronic kidney disease (CKD), stage II (mild)    stage II to III/notes 07/23/2014  . Diastolic heart failure secondary to hypertrophic cardiomyopathy ([place]) CARDIOLOGIST-- DR Daneen Schick  . Dyspnea    increased exertion   . Edema 07/2013  . 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 Gout, arthritis 2014   bil feet. right worse  . History of cellulitis of skin with lymphangitis LEFT LEG  . HOCM (hypertrophic obstructive cardiomyopathy) ([place])    a. Echo 11/16: Severe LVH, EF 55-60%, abnormal GLS consistent with HOCM, no SAM, mild LAE  . Hypertension 20 years  . Lymphedema of lower extremity LEFT  >  RIGHT   "using ankle high socks at home; pump doesn't work for me" (07/23/2014)  . Pacemaker    02/12/11  . Pneumonia 08/2011  . Seasonal asthma NO INHALERS   30 years  . Second degree Mobitz II AV block    with syncope, s/p PPM  . Short of breath on exertion   . Type II diabetes mellitus ([place]) 2012   INSULIN DEPENDENT   Allergies  Allergen Reactions  . Lisinopril Anaphylaxis  . Shellfish Allergy Hives and Swelling    Current Outpatient Medications on File Prior to Visit  Medication Sig Dispense Refill  . allopurinol (ZYLOPRIM) 100 MG tablet TAKE 3 TABLETS BY MOUTH EVERY DAY 270 tablet 1  . Accu-Chek FastClix Lancets MISC 1 each by Other route 3 (three) times daily. 102 each 12  . acetaminophen (TYLENOL) 325 MG tablet Take 1-2 tablets (325-650 mg total) by mouth every 4 (four) hours as needed for mild pain.    . Agalsidase beta (FABRAZYME IV) Inject 115 mg into the vein every 14 (fourteen) days.     [name] Kitchen albuterol (PROAIR HFA) 108 (90 Base) MCG/ACT inhaler Inhale 1 puff into the lungs every 6 (six) hours as needed for wheezing or shortness of breath. 8.5 Inhaler 1  . amLODipine (NORVASC) 10 MG tablet TAKE 1 TABLET BY MOUTH EVERY DAY 90 tablet 2  . atorvastatin (LIPITOR) 80 MG tablet TAKE 1 TABLET (80 MG TOTAL) BY MOUTH DAILY AT 6 PM. 90  tablet 2  . clopidogrel (PLAVIX) 75 MG tablet TAKE 1 TABLET BY MOUTH EVERY DAY 90 tablet 2  . fexofenadine (ALLEGRA) 180 MG tablet Take 1 tablet (180 mg total) by mouth daily. 30 tablet 3  . fluticasone (FLONASE) 50 MCG/ACT nasal spray Place 1 spray into both nostrils daily. (Patient taking differently: Place 1 spray into both nostrils daily as needed for allergies. ) 16 g 1  . furosemide (LASIX) 20 MG tablet TAKE 1 TABLET BY MOUTH EVERY DAY 90 tablet 2  . glucose blood (ACCU-CHEK AVIVA PLUS) test strip 1 each by Other route 3 (three) times daily. Use as instructed 100 each 12  .  insulin aspart protamine - aspart (NOVOLOG MIX 70/30 FLEXPEN) (70-30) 100 UNIT/ML FlexPen Inject 0.28 mLs (28 Units total) into the skin 2 (two) times daily. 15 mL 11  . Insulin Pen Needle (PX SHORTLENGTH PEN NEEDLES) 31G X 8 MM MISC 1 each by Does not apply route 2 (two) times daily before a meal. 100 each 8  . losartan-hydrochlorothiazide (HYZAAR) 50-12.5 MG tablet Take 1 tablet by mouth daily. 90 tablet 0  . mupirocin ointment (BACTROBAN) 2 % Apply to affected area daily with dressing change. 30 g 0  . OLIVE LEAF EXTRACT PO Take 1 capsule by mouth daily.     No current facility-administered medications on file prior to visit.    Observations/Objective: Alert and oriented x 3. Not in acute distress. Physical examination not completed as this is a telemedicine visit.  Assessment and Plan: 1. Type 2 diabetes mellitus with other circulatory complication, with long-term current use of insulin ([place]): -Continue Novolog Mix 70/30, 28 units twice daily, as prescribed. Refills on file for this. -Last hemoglobin A1C on 08/23/2019. Will obtain repeat at next in-person office visit. -To achieve an A1C goal of less than or equal to 7.0 percent, a fasting blood sugar of 80 to 130 mg/dL and a postprandial glucose (90 to 120 minutes after a meal) less than 180 mg/dL. In the event of sugars less than 60 mg/dl or greater than 400 mg/dl  please notify the clinic ASAP. It is recommended that you undergo annual eye exams and annual foot exams. -Discussed the importance of healthy eating habits, low-carbohydrate diet, low-sugar diet, regular aerobic exercise (at least 150 minutes a week as tolerated) and medication compliance to achieve or maintain control of diabetes. -Follow-up with primary physician in 1 month or sooner if needed.  2. Essential hypertension: -Continue Amlodipine, 10 mg daily, as prescribed. -Last CMP on 08/23/2019. -Last CBC on 08/23/2019. -Counseled on blood pressure goal of less than 130/80, low-sodium, DASH diet, medication compliance, 150 minutes of moderate intensity exercise per week as tolerated. Discussed medication compliance, adverse effects. -Follow-up with primary physician in 1 month or sooner if needed.  3. Lymphedema: -Today reports he is still wearing bilateral compression stockings. Reports he does not wear them everyday in order to let his legs get some air. Reports his concern is that when he places compression stockings on that the left lateral foot bleeds and has an odor. Denies injuring and trauma to the foot. Reports he has been changing the dressing using gauze and tape and typically leaves the dressings on for 2 days before changing. Reports saw wound care, Dr. Dellia Nims, in the past for similar reasons. -Referral to wound care center for further evaluation and management.  - AMB referral to wound care center  Follow Up Instructions: Follow-up with primary physician in 1 month.   Patient was given clear instructions to go to Emergency Department or return to medical center if symptoms don't improve, worsen, or new problems develop.The patient verbalized understanding.  I discussed the assessment and treatment plan with the patient. The patient was provided an opportunity to ask questions and all were answered. The patient agreed with the plan and demonstrated an understanding of the  instructions.   The patient was advised to call back or seek an in-person evaluation if the symptoms worsen or if the condition fails to improve as anticipated.   I provided 23 minutes total of non-face-to-face time during this encounter including median intraservice time, reviewing previous notes, labs, imaging, medications, management and  patient verbalized understanding.    Camillia Herter, NP  [facility] and [facility] [place], [place]   12/14/2019, 11:20 AM

## 2019-12-15 NOTE — Patient Instructions (Signed)
Continue Novolog Mix 70/30 for diabetes. Continue Amlodipine for blood pressure. Referral to wound care for lymphedema. Follow-up with primary physician in 1 month or sooner if needed. Lymphedema  Lymphedema is swelling that is caused by the abnormal collection of lymph in the tissues under the skin. Lymph is fluid from the tissues in your body that is removed through the lymphatic system. This system is part of your body's defense system (immune system) and includes lymph nodes and lymph vessels. The lymph vessels collect and carry the excess fluid, fats, proteins, and wastes from the tissues of the body to the bloodstream. This system also works to clean and remove bacteria and waste products from the body. Lymphedema occurs when the lymphatic system is blocked. When the lymph vessels or lymph nodes are blocked or damaged, lymph does not drain properly. This causes an abnormal buildup of lymph, which leads to swelling in the affected area. This may include the trunk area, or an arm or leg. Lymphedema cannot be cured by medicines, but various methods can be used to help reduce the swelling. There are two types of lymphedema: primary lymphedema and secondary lymphedema. What are the causes? The cause of this condition depends on the type of lymphedema that you have.  Primary lymphedema is caused by the absence of lymph vessels or having abnormal lymph vessels at birth.  Secondary lymphedema occurs when lymph vessels are blocked or damaged. Secondary lymphedema is more common. Common causes of lymph vessel blockage include: ? Skin infection, such as cellulitis. ? Infection by parasites (filariasis). ? Injury. ? Radiation therapy. ? Cancer. ? Formation of scar tissue. ? Surgery. What are the signs or symptoms? Symptoms of this condition include:  Swelling of the arm or leg.  A heavy or tight feeling in the arm or leg.  Swelling of the feet, toes, or fingers. Shoes or rings may fit more  tightly than before.  Redness of the skin over the affected area.  Limited movement of the affected limb.  Sensitivity to touch or discomfort in the affected limb. How is this diagnosed? This condition may be diagnosed based on:  Your symptoms and medical history.  A physical exam.  Bioimpedance spectroscopy. In this test, painless electrical currents are used to measure fluid levels in your body.  Imaging tests, such as: ? Lymphoscintigraphy. In this test, a low dose of a radioactive substance is injected to trace the flow of lymph through the lymph vessels. ? MRI. ? CT scan. ? Duplex ultrasound. This test uses sound waves to produce images of the vessels and the blood flow on a screen. ? Lymphangiography. In this test, a contrast dye is injected into the lymph vessel to help show blockages. How is this treated? Treatment for this condition may depend on the cause of your lymphedema. Treatment may include:  Complete decongestive therapy (CDT). This is done by a certified lymphedema therapist to reduce fluid congestion. This therapy includes: ? Manual lymph drainage. This is a special massage technique that promotes lymph drainage out of a limb. ? Skin care. ? Compression wrapping of the affected area. ? Specific exercises. Certain exercises can help fluid move out of the affected limb.  Compression. Various methods may be used to apply pressure to the affected limb to reduce the swelling. They include: ? Wearing compression stockings or sleeves on the affected limb. ? Wrapping the affected limb with special bandages.  Surgery. This is usually done for severe cases only. For example, surgery may be  done if you have trouble moving the limb or if the swelling does not get better with other treatments. If an underlying condition is causing the lymphedema, treatment for that condition will be done. For example, antibiotic medicines may be used to treat an infection. Follow these  instructions at home: Self-care  The affected area is more likely to become injured or infected. Take these steps to help prevent infection: ? Keep the affected area clean and dry. ? Use approved creams or lotions to keep the skin moisturized. ? Protect your skin from cuts:  Use gloves while cooking or gardening.  Do not walk barefoot.  If you shave the affected area, use an Copy.  Do not wear tight clothes, shoes, or jewelry.  Eat a healthy diet that includes a lot of fruits and vegetables. Activity  Exercise regularly as directed by your health care provider.  Do not sit with your legs crossed.  When possible, keep the affected limb raised (elevated) above the level of your heart.  Avoid carrying things with an arm that is affected by lymphedema. General instructions  Wear compression stockings or sleeves as told by your health care provider.  Note any changes in size of the affected limb. You may be instructed to take regular measurements and keep track of them.  Take over-the-counter and prescription medicines only as told by your health care provider.  If you were prescribed an antibiotic medicine, take or apply it as told by your health care provider. Do not stop using the antibiotic even if you start to feel better.  Do not use heating pads or ice packs over the affected area.  Avoid having blood draws, IV insertions, or blood pressure checked on the affected limb.  Keep all follow-up visits as told by your health care provider. This is important. Contact a health care provider if you:  Continue to have swelling in your limb.  Have a cut that does not heal.  Have redness or pain in the affected area. Get help right away if you:  Have new swelling in your limb that comes on suddenly.  Develop purplish spots, rash or sores (lesions) on your affected limb.  Have shortness of breath.  Have a fever or chills. Summary  Lymphedema is swelling that  is caused by the abnormal collection of lymph in the tissues under the skin.  Lymph is fluid from the tissues in your body that is removed through the lymphatic system. This system collects and carries excess fluid, fats, proteins, and wastes from the tissues of the body to the bloodstream.  Lymphedema causes swelling, pain, and redness in the affected area. This may include the trunk area, or an arm or leg.  Treatment for this condition may depend on the cause of your lymphedema. Treatment may include complete decongestive therapy (CDT), compression methods, surgery, or treating the underlying cause. This information is not intended to replace advice given to you by your health care provider. Make sure you discuss any questions you have with your health care provider. Document Revised: 06/06/2017 Document Reviewed: 06/06/2017 Elsevier Patient Education  2020 [employer].

## 2019-12-18 DIAGNOSIS — E7521 Fabry (-Anderson) disease: Secondary | ICD-10-CM | POA: Diagnosis not present

## 2019-12-24 ENCOUNTER — Ambulatory Visit (INDEPENDENT_AMBULATORY_CARE_PROVIDER_SITE_OTHER): Payer: Medicare HMO | Admitting: *Deleted

## 2019-12-24 DIAGNOSIS — I441 Atrioventricular block, second degree: Secondary | ICD-10-CM

## 2019-12-25 LAB — CUP PACEART REMOTE DEVICE CHECK
Battery Remaining Longevity: 91 mo
Battery Remaining Percentage: 95.5 %
Battery Voltage: 3.01 V
Brady Statistic AP VP Percent: 59 %
Brady Statistic AP VS Percent: 1 %
Brady Statistic AS VP Percent: 41 %
Brady Statistic AS VS Percent: 1 %
Brady Statistic RA Percent Paced: 58 %
Brady Statistic RV Percent Paced: 99 %
Date Time Interrogation Session: 20210719054531
Implantable Lead Implant Date: 20120907
Implantable Lead Implant Date: 20120907
Implantable Lead Location: 753859
Implantable Lead Location: 753860
Implantable Pulse Generator Implant Date: 20201215
Lead Channel Impedance Value: 380 Ohm
Lead Channel Impedance Value: 430 Ohm
Lead Channel Pacing Threshold Amplitude: 0.625 V
Lead Channel Pacing Threshold Amplitude: 2 V
Lead Channel Pacing Threshold Pulse Width: 0.5 ms
Lead Channel Pacing Threshold Pulse Width: 0.8 ms
Lead Channel Sensing Intrinsic Amplitude: 5 mV
Lead Channel Setting Pacing Amplitude: 1.625
Lead Channel Setting Pacing Amplitude: 2.25 V
Lead Channel Setting Pacing Pulse Width: 0.8 ms
Lead Channel Setting Sensing Sensitivity: 4 mV
Pulse Gen Model: 2272
Pulse Gen Serial Number: 9187005

## 2019-12-25 NOTE — Progress Notes (Signed)
Remote pacemaker transmission.   

## 2020-01-01 DIAGNOSIS — E7521 Fabry (-Anderson) disease: Secondary | ICD-10-CM | POA: Diagnosis not present

## 2020-01-15 DIAGNOSIS — E7521 Fabry (-Anderson) disease: Secondary | ICD-10-CM | POA: Diagnosis not present

## 2020-01-16 ENCOUNTER — Ambulatory Visit: Payer: Medicare HMO | Admitting: Podiatry

## 2020-01-17 ENCOUNTER — Encounter (HOSPITAL_BASED_OUTPATIENT_CLINIC_OR_DEPARTMENT_OTHER): Payer: Medicare HMO | Attending: Internal Medicine | Admitting: Internal Medicine

## 2020-01-17 DIAGNOSIS — Z950 Presence of cardiac pacemaker: Secondary | ICD-10-CM | POA: Insufficient documentation

## 2020-01-17 DIAGNOSIS — E669 Obesity, unspecified: Secondary | ICD-10-CM | POA: Diagnosis not present

## 2020-01-17 DIAGNOSIS — J449 Chronic obstructive pulmonary disease, unspecified: Secondary | ICD-10-CM | POA: Diagnosis not present

## 2020-01-17 DIAGNOSIS — N186 End stage renal disease: Secondary | ICD-10-CM | POA: Insufficient documentation

## 2020-01-17 DIAGNOSIS — L97321 Non-pressure chronic ulcer of left ankle limited to breakdown of skin: Secondary | ICD-10-CM | POA: Insufficient documentation

## 2020-01-17 DIAGNOSIS — I132 Hypertensive heart and chronic kidney disease with heart failure and with stage 5 chronic kidney disease, or end stage renal disease: Secondary | ICD-10-CM | POA: Insufficient documentation

## 2020-01-17 DIAGNOSIS — Z8673 Personal history of transient ischemic attack (TIA), and cerebral infarction without residual deficits: Secondary | ICD-10-CM | POA: Insufficient documentation

## 2020-01-17 DIAGNOSIS — E11622 Type 2 diabetes mellitus with other skin ulcer: Secondary | ICD-10-CM | POA: Insufficient documentation

## 2020-01-17 DIAGNOSIS — E1122 Type 2 diabetes mellitus with diabetic chronic kidney disease: Secondary | ICD-10-CM | POA: Diagnosis not present

## 2020-01-17 DIAGNOSIS — M109 Gout, unspecified: Secondary | ICD-10-CM | POA: Insufficient documentation

## 2020-01-17 DIAGNOSIS — Z6841 Body Mass Index (BMI) 40.0 and over, adult: Secondary | ICD-10-CM | POA: Diagnosis not present

## 2020-01-17 DIAGNOSIS — E1151 Type 2 diabetes mellitus with diabetic peripheral angiopathy without gangrene: Secondary | ICD-10-CM | POA: Insufficient documentation

## 2020-01-17 DIAGNOSIS — I890 Lymphedema, not elsewhere classified: Secondary | ICD-10-CM | POA: Insufficient documentation

## 2020-01-17 DIAGNOSIS — I5032 Chronic diastolic (congestive) heart failure: Secondary | ICD-10-CM | POA: Insufficient documentation

## 2020-01-17 NOTE — Progress Notes (Signed)
[name], [name] ([id number]) Visit Report for 01/17/2020 Abuse/Suicide Risk Screen Details Patient Name: Date of Service: [name] Bishop MS, [name] MES D. 01/17/2020 9:00 A M Medical Record Number: [id number] Patient Account Number: [id number] Date of Birth/Sex: Treating RN: Dec 15, 1960 (59 y.o. [name] Primary Care [name]: [name] Other Clinician: Referring [name]: Treating [name]/Extender: [name] in Treatment: 0 Abuse/Suicide Risk Screen Items Answer ABUSE RISK SCREEN: Has anyone close to you tried to hurt or harm you recentlyo No Do you feel uncomfortable with anyone in your familyo No Has anyone forced you do things that you didnt want to doo No Electronic Signature(s) Signed: 01/17/2020 5:05:13 PM By: [name] Entered By: [name] on 01/17/2020 08:29:25 -------------------------------------------------------------------------------- Activities of Daily Living Details Patient Name: Date of Service: [name] MES D. 01/17/2020 9:00 A M Medical Record Number: [id number] Patient Account Number: [id number] Date of Birth/Sex: Treating RN: 11/14/1960 (59 y.o. [name] Primary Care [name]: [name] Other Clinician: Referring Shaul Trautman: Treating [name]/Extender: [name] in Treatment: 0 Activities of Daily Living Items Answer Activities of Daily Living (Please select one for each item) Drive Automobile Completely Able T Medications ake Completely Able Use T elephone Completely Able Care for Appearance Completely Able Use T oilet Completely Able Bath / Shower Completely Able Dress Self Completely Able Feed Self Completely Able Walk Completely Able Get In / Out Bed Completely Able Housework Completely Able Prepare Meals Completely [place] for Self Completely Able Electronic Signature(s) Signed: 01/17/2020 5:05:13 PM By:  [name] Entered By: [name] on 01/17/2020 08:29:44 -------------------------------------------------------------------------------- Education Screening Details Patient Name: Date of Service: [name] Bishop MS, [name] 01/17/2020 9:00 A M Medical Record Number: [id number] Patient Account Number: [id number] Date of Birth/Sex: Treating RN: [birth year] (59 y.o. [name] Primary Care Kanyia Heaslip: [name] Other Clinician: Referring [name]: Treating [name]/Extender: [name] in Treatment: 0 Primary Learner Assessed: Patient Learning Preferences/Education Level/Primary Language Learning Preference: Explanation Preferred Language: English Cognitive Barrier Language Barrier: No Translator Needed: No Memory Deficit: No Emotional Barrier: No Cultural/Religious Beliefs Affecting Medical Care: No Physical Barrier Impaired Vision: No Impaired Hearing: No Decreased Hand dexterity: No Knowledge/Comprehension Knowledge Level: High Comprehension Level: High Ability to understand written instructions: High Ability to understand verbal instructions: High Motivation Anxiety Level: Calm Cooperation: Cooperative Education Importance: Acknowledges Need Interest in Health Problems: Asks Questions Perception: Coherent Willingness to Engage in Self-Management High Activities: Readiness to Engage in Self-Management High Activities: Electronic Signature(s) Signed: 01/17/2020 5:05:13 PM By: [name] Entered By: [name] on 01/17/2020 08:30:03 -------------------------------------------------------------------------------- Fall Risk Assessment Details Patient Name: Date of Service: [name] Bishop MS, [name] 01/17/2020 9:00 A M Medical Record Number: [id number] Patient Account Number: [id number] Date of Birth/Sex: Treating RN: 05/25/1961 (59 y.o. [name] Primary Care Rosalia Mcavoy: [name] Other  Clinician: Referring [name]: Treating [name]/Extender: [name] in Treatment: 0 Fall Risk Assessment Items Have you had 2 or more falls in the last 12 monthso 0 No Have you had any fall that resulted in injury in the last 12 monthso 0 No FALLS RISK SCREEN History of falling - immediate or within 3 months 0 No Secondary diagnosis (Do you have 2 or more medical diagnoseso) 15 Yes Ambulatory aid None/bed rest/wheelchair/nurse 0 No Crutches/cane/walker 15 Yes Furniture 0 No Intravenous therapy Access/Saline/Heparin Lock 0 No Gait/Transferring Normal/ bed rest/ wheelchair 0 Yes Weak (short steps with or without shuffle, stooped but able  to lift head while walking, may seek 0 No support from furniture) Impaired (short steps with shuffle, may have difficulty arising from chair, head down, impaired 0 No balance) Mental Status Oriented to own ability 0 Yes Electronic Signature(s) Signed: 01/17/2020 5:05:13 PM By: [name] Entered By: [name] on 01/17/2020 08:31:06 -------------------------------------------------------------------------------- Foot Assessment Details Patient Name: Date of Service: [name] Bishop MS, [name] 01/17/2020 9:00 A M Medical Record Number: [id number] Patient Account Number: [id number] Date of Birth/Sex: Treating RN: 04/16/1961 (59 y.o. [name] Primary Care [name]: [name] Other Clinician: Referring [name]: Treating [name]/Extender: [name] in Treatment: 0 Foot Assessment Items Site Locations + = Sensation present, - = Sensation absent, C = Callus, U = Ulcer R = Redness, W = Warmth, M = Maceration, PU = Pre-ulcerative lesion F = Fissure, S = Swelling, D = Dryness Assessment Right: Left: Other Deformity: No No Prior Foot Ulcer: No No Prior Amputation: No No Charcot Joint: No No Ambulatory Status: Ambulatory With Help Assistance Device: Cane Gait:  Steady Electronic Signature(s) Signed: 01/17/2020 5:05:13 PM By: [name] Entered By: [name] on 01/17/2020 08:31:45 -------------------------------------------------------------------------------- Nutrition Risk Screening Details Patient Name: Date of Service: [name] Bishop MS, [name] 01/17/2020 9:00 A M Medical Record Number: [id number] Patient Account Number: [id number] Date of Birth/Sex: Treating RN: November 16, 1960 (59 y.o. [name] Primary Care Vianne Grieshop: [name] Other Clinician: Referring Galya Dunnigan: Treating Paighton Godette/Extender: [name] in Treatment: 0 Height (in): 70 Weight (lbs): 284 Body Mass Index (BMI): 40.7 Nutrition Risk Screening Items Score Screening NUTRITION RISK SCREEN: I have an illness or condition that made me change the kind and/or amount of food I eat 0 No I eat fewer than two meals per day 0 No I eat few fruits and vegetables, or milk products 0 No I have three or more drinks of beer, liquor or wine almost every day 0 No I have tooth or mouth problems that make it hard for me to eat 0 No I don't always have enough money to buy the food I need 0 No I eat alone most of the time 0 No I take three or more different prescribed or over-the-counter drugs a day 1 Yes Without wanting to, I have lost or gained 10 pounds in the last six months 0 No I am not always physically able to shop, cook and/or feed myself 0 No Nutrition Protocols Good Risk Protocol 0 No interventions needed Moderate Risk Protocol High Risk Proctocol Risk Level: Good Risk Score: 1 Electronic Signature(s) Signed: 01/17/2020 5:05:13 PM By: [name] Entered By: [name] on 01/17/2020 08:31:15

## 2020-01-18 NOTE — Progress Notes (Signed)
REO, PORTELA ([id number]) Visit Report for 01/17/2020 Chief Complaint Document Details Patient Name: Date of Service: [name] MES D. 01/17/2020 9:00 A M Medical Record Number: [id number] Patient Account Number: [id number] Date of Birth/Sex: Treating RN: 13-Jan-1961 (59 y.o. [name] Primary Care Provider: Karle Plumber Other Clinician: Referring Provider: Treating Provider/Extender: Evlyn Kanner in Treatment: 0 Information Obtained from: Patient Chief Complaint The patient is a 59 yrs old bm here for treatment of a buttock ulcer. He has diabetes, hypertension, heart disease, Fabry's disease, gout and chronic kidney disease. He noticed tenderness and pain in the buttock area and was seen in the ED. He was admitted and taken to the OR for debridement of an abscess. His blood sugars are doing much better. He is currently on the [employer]. He has been packing the area by himself at home 11/28/2018. Patient returns today for review of the edema in his legs. 09/04/19; this is a patient who returns for review of wounds on his bilateral dorsal feet ---------- 01/17/2020-Patient reestablishes in clinic today with open area left anterior ankle noticed for the past 1 to 2 weeks Electronic Signature(s) Signed: 01/17/2020 8:48:43 AM By: Tobi Bastos MD, MBA Entered By: Tobi Bastos on 01/17/2020 08:48:42 -------------------------------------------------------------------------------- HPI Details Patient Name: Date of Service: [name] Bishop MS, [name] 01/17/2020 9:00 A M Medical Record Number: [id number] Patient Account Number: [id number] Date of Birth/Sex: Treating RN: May 05, 1961 (59 y.o. [name] Primary Care Provider: Karle Plumber Other Clinician: Referring Provider: Treating Provider/Extender: Evlyn Kanner in Treatment: 0 History of Present Illness Location: Patient presents with a wound to the left buttock  area. Quality: Patient reports experiencing a dull pain to affected area(s). Severity: Mildly severe wound with no evidence of infection Duration: Patient has had the wound for   2 weeks prior to presenting for treatment Timing: Pain in wound is Intermittent (comes and goes) HPI Description: ADMISSION 11/28/2018 This is a pleasant 59 year old man who has chronic lymphedema since about 2010. The exact etiology of this is not really clear indeed if this is been worked up it is not really clear. In any case he also has type 2 diabetes. He was in hospital recently for a small brainstem CVA discharged in late May with bilateral Unna wraps on his legs. Is not clear that he had wounds in the hospital. Nevertheless he comes in today with the same Unna boots on his legs. He has compression stockings at home but they are old and he wanted to get remeasured. He went to [place] however they said that they would need up-to-date measurements which I believe we can provide today. He also has compression pumps which he uses intermittently at home. I am not really sure who ordered these. He does not have a wound history but states that when he went in the hospital his legs were so big that his stockings did not fit at home Past medical history includes type 2 diabetes, lymphedema, bursitis in the left knee on 10/23/2018, hyper lipidemia, recent brainstem CVA leaving him with some neurologic disability but much better, Febres disease, pacemaker and hypertension. The patient had venous reflux studies in June 2019. There was abnormal reflux times bilaterally in the proximal veins but it does not look like they were able to obtain reflux in his bilateral lower legs. I am not sure what the issue was here may have been too much edema to visualize the veins 09/04/19; READMISSION this is  a now 59 year old man who is been in this clinic at least once before most recently in June of last year. He has lymphedema the exact  etiology of which is unclear. He has been fairly consistent and saying this goes back about 12 years. Almost totally confined to below his knees he wears 20/30 below- knee stockings. He also has compression pumps at home that he is mostly compliant with. Apparently about 2 weeks ago his legs began to swell. He left his stockings on for a prolonged period of time, didn't take them off in the eventually rubbed wounds in his dorsal feet just distal to the crease of his ankles. He has been unable to get his stockings on since then and has not been using his compression pumps.it is not clear what is been using to the wounds perhaps Bactroban. His past medical history is reviewed and includes type 2 diabetes with a recent hemoglobin A1c of 9, hypertension, Fabry's disease, hyperlipidemia. Brainstem CVA diastolic heart failure, stage III chronic renal failure and lymphedema as noted. his legs are much too large to do ABIs on. Previously we have not felt he had an arterial issue 4/6; the areas in the crease of his ankles are better on both sides. Tissue looks healthy we have been using silver alginate under compression he is using his compression pumps 4/13; the wounds in the creases of his ankles on both sides which were issues with related to his inner layer stockings of his juxta lites are both closed. He has lymphedema and he has external compression pumps. I think he is generally very compliant. He has significant lymphedema with pantaloon deformities around his ankle -------------------------------------------------------------- 01/17/2020- Patient returns to clinic with a new open area on his left anterior ankle in the crease on the dorsum, since about 2 weeks, patient has been using his compression devices sometimes decrease due to the shape of his leg with a tendency to pool around his ankle in a pantaloon deformity. Patient denies any fevers chills or shakes, denies any malodorous discharge. He has  been compliant with his lymphedema pumps which he uses once a day he is on Lasix 1 tablet a unknown dose he does not have sleep apnea Patient has been using Neosporin with gauze to this area and continuing to apply compression as best as possible ABIs were not obtained in the past due to the significant lymphedema Electronic Signature(s) Signed: 01/17/2020 8:52:21 AM By: Tobi Bastos MD, MBA Previous Signature: 01/17/2020 8:50:24 AM Version By: Tobi Bastos MD, MBA Entered By: Tobi Bastos on 01/17/2020 08:52:21 -------------------------------------------------------------------------------- Physical Exam Details Patient Name: Date of Service: [name] Bishop MS, [name] 01/17/2020 9:00 A M Medical Record Number: [id number] Patient Account Number: [id number] Date of Birth/Sex: Treating RN: Sep 15, 1960 (59 y.o. [name] Primary Care Provider: Karle Plumber Other Clinician: Referring Provider: Treating Provider/Extender: Evlyn Kanner in Treatment: 0 Constitutional alert and oriented x 3. sitting or standing blood pressure is within target range for patient.. supine blood pressure is within target range for patient.. pulse regular and within target range for patient.[name] Kitchen respirations regular, non-labored and within target range for patient.[name] Kitchen temperature within target range for patient.. . . Well- nourished and well-hydrated in no acute distress. Eyes conjunctiva clear no eyelid edema noted. pupils equal round and reactive to light and accommodation. Ears, Nose, Mouth, and Throat no gross abnormality of ear auricles or external auditory canals. normal hearing noted during conversation. mucus membranes moist. Neck supple with no LAD  noted in anterior or posterior cervical chain. not enlarged. Respiratory normal breathing without difficulty. clear to auscultation bilaterally. Cardiovascular regular rate and rhythm with normal S1, S2. no bruits with no  significant JVD. 2+ femoral pulses. Significant lymphedema and Pantaloon distribution. Gastrointestinal (GI) soft, non-tender, non-distended, +BS. no hepatosplenomegaly. no ventral hernia noted. Lymphatic no axillary lymphadenopathy. no inguinal lymphadenopathy. Integumentary (Hair, Skin) normal hair distribution and pattern. skin pink, warm, dry. Neurological cranial nerves 2-12 intact. Patient has normal sensation in the feet bilaterally to light touch. Psychiatric this patient is able to make decisions and demonstrates good insight into disease process. Alert and Oriented x 3. pleasant and cooperative. Notes Open area in the dorsum of the left ankle crease, clean granulation, surrounding skin otherwise intact, lymphedema in both legs, healed right anterior leg wound noted Electronic Signature(s) Signed: 01/17/2020 8:51:38 AM By: Tobi Bastos MD, MBA Entered By: Tobi Bastos on 01/17/2020 08:51:37 -------------------------------------------------------------------------------- Physician Orders Details Patient Name: Date of Service: [name] Bishop MS, [name] 01/17/2020 9:00 A M Medical Record Number: [id number] Patient Account Number: [id number] Date of Birth/Sex: Treating RN: 11-May-1961 (59 y.o. [name] Primary Care Provider: Karle Plumber Other Clinician: Referring Provider: Treating Provider/Extender: Evlyn Kanner in Treatment: 0 Verbal / Phone Orders: No Diagnosis Coding Follow-up Appointments Return Appointment in 1 week. Dressing Change Frequency Wound #4 Left,Anterior Ankle Do not change entire dressing for one week. Skin Barriers/Peri-Wound Care Moisturizing lotion Wound Cleansing May shower with protection. - use cast protector Primary Wound Dressing Wound #4 Left,Anterior Ankle Calcium Alginate with Silver Secondary Dressing Wound #4 Left,Anterior Ankle Dry Gauze Edema Control 3 Layer Compression System - Left Lower  Extremity - unna boot at top to anchor Elevate legs to the level of the heart or above for 30 minutes daily and/or when sitting, a frequency of: - throughout the day Exercise regularly Segmental Compressive Device. - Lymphedema pumps 2 times a day for 1 hour each time Electronic Signature(s) Signed: 01/17/2020 5:03:09 PM By: Tobi Bastos MD, MBA Signed: 01/18/2020 5:19:54 PM By: Levan Hurst RN, BSN Entered By: Levan Hurst on 01/17/2020 08:47:56 -------------------------------------------------------------------------------- Problem List Details Patient Name: Date of Service: [name] Bishop MS, [name] 01/17/2020 9:00 A M Medical Record Number: [id number] Patient Account Number: [id number] Date of Birth/Sex: Treating RN: 1961/05/18 (59 y.o. [name] Primary Care Provider: Other Clinician: Karle Plumber Referring Provider: Treating Provider/Extender: Evlyn Kanner in Treatment: 0 Active Problems ICD-10 Encounter Code Description Active Date MDM Diagnosis L97.321 Non-pressure chronic ulcer of left ankle limited to breakdown of skin 01/17/2020 No Yes I89.0 Lymphedema, not elsewhere classified 01/17/2020 No Yes P80.99 Chronic diastolic (congestive) heart failure 01/17/2020 No Yes E11.21 Type 2 diabetes mellitus with diabetic nephropathy 01/17/2020 No Yes Inactive Problems Resolved Problems Electronic Signature(s) Signed: 01/17/2020 8:48:03 AM By: Tobi Bastos MD, MBA Entered By: Tobi Bastos on 01/17/2020 08:48:03 -------------------------------------------------------------------------------- Progress Note Details Patient Name: Date of Service: [name] Bishop MS, [name] 01/17/2020 9:00 A M Medical Record Number: [id number] Patient Account Number: [id number] Date of Birth/Sex: Treating RN: 12-22-1960 (59 y.o. [name] Primary Care Provider: Karle Plumber Other Clinician: Referring Provider: Treating Provider/Extender: Evlyn Kanner in Treatment: 0 Subjective Chief Complaint Information obtained from Patient The patient is a 59 yrs old bm here for treatment of a buttock ulcer. He has diabetes, hypertension, heart disease, Fabry's disease, gout and chronic kidney disease. He noticed tenderness and pain in the buttock area and was seen in the ED.  He was admitted and taken to the OR for debridement of an abscess. His blood sugars are doing much better. He is currently on the [employer]. He has been packing the area by himself at home 11/28/2018. Patient returns today for review of the edema in his legs. 09/04/19; this is a patient who returns for review of wounds on his bilateral dorsal feet ---------- 01/17/2020-Patient reestablishes in clinic today with open area left anterior ankle noticed for the past 1 to 2 weeks History of Present Illness (HPI) The following HPI elements were documented for the patient's wound: Location: Patient presents with a wound to the left buttock area. Quality: Patient reports experiencing a dull pain to affected area(s). Severity: Mildly severe wound with no evidence of infection Duration: Patient has had the wound for   2 weeks prior to presenting for treatment Timing: Pain in wound is Intermittent (comes and goes) ADMISSION 11/28/2018 This is a pleasant 59 year old man who has chronic lymphedema since about 2010. The exact etiology of this is not really clear indeed if this is been worked up it is not really clear. In any case he also has type 2 diabetes. He was in hospital recently for a small brainstem CVA discharged in late May with bilateral Unna wraps on his legs. Is not clear that he had wounds in the hospital. Nevertheless he comes in today with the same Unna boots on his legs. He has compression stockings at home but they are old and he wanted to get remeasured. He went to [place] however they said that they would need up-to-date measurements which I  believe we can provide today. He also has compression pumps which he uses intermittently at home. I am not really sure who ordered these. He does not have a wound history but states that when he went in the hospital his legs were so big that his stockings did not fit at home Past medical history includes type 2 diabetes, lymphedema, bursitis in the left knee on 10/23/2018, hyper lipidemia, recent brainstem CVA leaving him with some neurologic disability but much better, Febres disease, pacemaker and hypertension. The patient had venous reflux studies in June 2019. There was abnormal reflux times bilaterally in the proximal veins but it does not look like they were able to obtain reflux in his bilateral lower legs. I am not sure what the issue was here may have been too much edema to visualize the veins 09/04/19; READMISSION this is a now 59 year old man who is been in this clinic at least once before most recently in June of last year. He has lymphedema the exact etiology of which is unclear. He has been fairly consistent and saying this goes back about 12 years. Almost totally confined to below his knees he wears 20/30 below- knee stockings. He also has compression pumps at home that he is mostly compliant with. Apparently about 2 weeks ago his legs began to swell. He left his stockings on for a prolonged period of time, didn't take them off in the eventually rubbed wounds in his dorsal feet just distal to the crease of his ankles. He has been unable to get his stockings on since then and has not been using his compression pumps.it is not clear what is been using to the wounds perhaps Bactroban. His past medical history is reviewed and includes type 2 diabetes with a recent hemoglobin A1c of 9, hypertension, Fabry's disease, hyperlipidemia. Brainstem CVA diastolic heart failure, stage III chronic renal failure and lymphedema  as noted. his legs are much too large to do ABIs on. Previously we have not  felt he had an arterial issue 4/6; the areas in the crease of his ankles are better on both sides. Tissue looks healthy we have been using silver alginate under compression he is using his compression pumps 4/13; the wounds in the creases of his ankles on both sides which were issues with related to his inner layer stockings of his juxta lites are both closed. He has lymphedema and he has external compression pumps. I think he is generally very compliant. He has significant lymphedema with pantaloon deformities around his ankle -------------------------------------------------------------- 01/17/2020- Patient returns to clinic with a new open area on his left anterior ankle in the crease on the dorsum, since about 2 weeks, patient has been using his compression devices sometimes decrease due to the shape of his leg with a tendency to pool around his ankle in a pantaloon deformity. Patient denies any fevers chills or shakes, denies any malodorous discharge. He has been compliant with his lymphedema pumps which he uses once a day he is on Lasix 1 tablet a unknown dose he does not have sleep apnea Patient has been using Neosporin with gauze to this area and continuing to apply compression as best as possible ABIs were not obtained in the past due to the significant lymphedema Patient History Information obtained from Patient. Allergies Shellfish Containing Products (Severity: Moderate, Reaction: itching), lisinopril (Severity: Severe, Reaction: anaphylaxis) Family History Diabetes - Maternal Grandparents, Kidney Disease - Siblings, Seizures - Mother, Stroke - Mother, No family history of Cancer, Heart Disease, Hereditary Spherocytosis, Hypertension, Lung Disease, Thyroid Problems, Tuberculosis. Social History Never smoker, Marital Status - Single, Alcohol Use - Never, Drug Use - No History, Caffeine Use - Moderate. Medical History Ear/Nose/Mouth/Throat Denies history of Chronic sinus  problems/congestion, Middle ear problems Hematologic/Lymphatic Patient has history of Lymphedema Denies history of Anemia, Hemophilia, Human Immunodeficiency Virus, Sickle Cell Disease Respiratory Denies history of Aspiration, Asthma, Chronic Obstructive Pulmonary Disease (COPD), Pneumothorax, Sleep Apnea, Tuberculosis Cardiovascular Patient has history of Arrhythmia - mobitz II AV block, Congestive Heart Failure - genetic condition, Hypertension Endocrine Patient has history of Type II Diabetes - long term Denies history of Type I Diabetes Genitourinary Patient has history of End Stage Renal Disease - stage II Integumentary (Skin) Denies history of History of Burn Musculoskeletal Patient has history of Gout - due to renal failure Denies history of Rheumatoid Arthritis, Osteoarthritis, Osteomyelitis Oncologic Denies history of Received Chemotherapy, Received Radiation Psychiatric Denies history of Anorexia/bulimia, Confinement Anxiety Hospitalization/Surgery History - pacemaker. - IandD upper back abscess. - IandD perirectal abscess. - umbilical hernia repair. - cardiac cath. Medical A Surgical History Notes nd Constitutional Symptoms (General Health) obesity Eyes double vision s/p CVA Respiratory chronic bronchitis Cardiovascular patient has Fabry's disease, cardiomyopathy, pacemaker Neurologic CVA Review of Systems (ROS) Ear/Nose/Mouth/Throat Denies complaints or symptoms of Chronic sinus problems or rhinitis. Respiratory Denies complaints or symptoms of Chronic or frequent coughs, Shortness of Breath. Neurologic Denies complaints or symptoms of Numbness/parasthesias. Psychiatric Denies complaints or symptoms of Claustrophobia, Suicidal. Objective Constitutional alert and oriented x 3. sitting or standing blood pressure is within target range for patient.. supine blood pressure is within target range for patient.. pulse regular and within target range for patient.[name] Kitchen  respirations regular, non-labored and within target range for patient.[name] Kitchen temperature within target range for patient.. Well- nourished and well-hydrated in no acute distress. Vitals Time Taken: 8:22 AM, Height: 70 in, Source: Stated, Weight: 284  lbs, Source: Stated, BMI: 40.7, Temperature: 98.9 F, Pulse: 64 bpm, Respiratory Rate: 19 breaths/min, Blood Pressure: 135/83 mmHg, Capillary Blood Glucose: 121 mg/dl. General Notes: patient stated CBG was 121 Eyes conjunctiva clear no eyelid edema noted. pupils equal round and reactive to light and accommodation. Ears, Nose, Mouth, and Throat no gross abnormality of ear auricles or external auditory canals. normal hearing noted during conversation. mucus membranes moist. Neck supple with no LAD noted in anterior or posterior cervical chain. not enlarged. Respiratory normal breathing without difficulty. clear to auscultation bilaterally. Cardiovascular regular rate and rhythm with normal S1, S2. no bruits with no significant JVD. 2+ femoral pulses. Significant lymphedema and Pantaloon distribution. Gastrointestinal (GI) soft, non-tender, non-distended, +BS. no hepatosplenomegaly. no ventral hernia noted. Lymphatic no axillary lymphadenopathy. no inguinal lymphadenopathy. Neurological cranial nerves 2-12 intact. Patient has normal sensation in the feet bilaterally to light touch. Psychiatric this patient is able to make decisions and demonstrates good insight into disease process. Alert and Oriented x 3. pleasant and cooperative. General Notes: Open area in the dorsum of the left ankle crease, clean granulation, surrounding skin otherwise intact, lymphedema in both legs, healed right anterior leg wound noted Integumentary (Hair, Skin) normal hair distribution and pattern. skin pink, warm, dry. Wound #4 status is Open. Original cause of wound was Gradually Appeared. The wound is located on the Left,Anterior Ankle. The wound measures 0.8cm length x  4cm width x 0.1cm depth; 2.513cm^2 area and 0.251cm^3 volume. There is Fat Layer (Subcutaneous Tissue) Exposed exposed. There is no tunneling or undermining noted. There is a small amount of serosanguineous drainage noted. The wound margin is distinct with the outline attached to the wound base. There is large (67-100%) red, pink granulation within the wound bed. There is a small (1-33%) amount of necrotic tissue within the wound bed including Adherent Slough. Assessment Active Problems ICD-10 Non-pressure chronic ulcer of left ankle limited to breakdown of skin Lymphedema, not elsewhere classified Chronic diastolic (congestive) heart failure Type 2 diabetes mellitus with diabetic nephropathy Procedures Wound #4 Pre-procedure diagnosis of Wound #4 is a Lymphedema located on the Left,Anterior Ankle . There was a Three Layer Compression Therapy Procedure by Levan Hurst, RN. Post procedure Diagnosis Wound #4: Same as Pre-Procedure Plan Follow-up Appointments: Return Appointment in 1 week. Dressing Change Frequency: Wound #4 Left,Anterior Ankle: Do not change entire dressing for one week. Skin Barriers/Peri-Wound Care: Moisturizing lotion Wound Cleansing: May shower with protection. - use cast protector Primary Wound Dressing: Wound #4 Left,Anterior Ankle: Calcium Alginate with Silver Secondary Dressing: Wound #4 Left,Anterior Ankle: Dry Gauze Edema Control: 3 Layer Compression System - Left Lower Extremity - unna boot at top to anchor Elevate legs to the level of the heart or above for 30 minutes daily and/or when sitting, a frequency of: - throughout the day Exercise regularly Segmental Compressive Device. - Lymphedema pumps 2 times a day for 1 hour each time -Start with silver alginate as primary dressing with 4 layer compression, patient has undergone this previously in fact this year in March -Patient continuing lymphedema pumps at home advised him to use it more than once a  day -Keep legs elevated as much as possible -Return to clinic next week Electronic Signature(s) Signed: 01/17/2020 8:53:18 AM By: Tobi Bastos MD, MBA Entered By: Tobi Bastos on 01/17/2020 08:53:18 -------------------------------------------------------------------------------- HxROS Details Patient Name: Date of Service: [name] Bishop MS, [name] 01/17/2020 9:00 A M Medical Record Number: [id number] Patient Account Number: [id number] Date of Birth/Sex: Treating RN: 03-Feb-1961 (59 y.o.  [name] Primary Care Provider: Karle Plumber Other Clinician: Referring Provider: Treating Provider/Extender: Evlyn Kanner in Treatment: 0 Information Obtained From Patient Ear/Nose/Mouth/Throat Complaints and Symptoms: Negative for: Chronic sinus problems or rhinitis Medical History: Negative for: Chronic sinus problems/congestion; Middle ear problems Respiratory Complaints and Symptoms: Negative for: Chronic or frequent coughs; Shortness of Breath Medical History: Negative for: Aspiration; Asthma; Chronic Obstructive Pulmonary Disease (COPD); Pneumothorax; Sleep Apnea; Tuberculosis Past Medical History Notes: chronic bronchitis Neurologic Complaints and Symptoms: Negative for: Numbness/parasthesias Medical History: Past Medical History Notes: CVA Psychiatric Complaints and Symptoms: Negative for: Claustrophobia; Suicidal Medical History: Negative for: Anorexia/bulimia; Confinement Anxiety Constitutional Symptoms (General Health) Medical History: Past Medical History Notes: obesity Eyes Medical History: Past Medical History Notes: double vision s/p CVA Hematologic/Lymphatic Medical History: Positive for: Lymphedema Negative for: Anemia; Hemophilia; Human Immunodeficiency Virus; Sickle Cell Disease Cardiovascular Medical History: Positive for: Arrhythmia - mobitz II AV block; Congestive Heart Failure - genetic condition; Hypertension Past  Medical History Notes: patient has Fabry's disease, cardiomyopathy, pacemaker Gastrointestinal Endocrine Medical History: Positive for: Type II Diabetes - long term Negative for: Type I Diabetes Time with diabetes: long term Treated with: Insulin Blood sugar tested every day: Yes Tested : 3 times per day Blood sugar testing results: Breakfast: <200; Bedtime: 140's Genitourinary Medical History: Positive for: End Stage Renal Disease - stage II Immunological Integumentary (Skin) Medical History: Negative for: History of Burn Musculoskeletal Medical History: Positive for: Gout - due to renal failure Negative for: Rheumatoid Arthritis; Osteoarthritis; Osteomyelitis Oncologic Medical History: Negative for: Received Chemotherapy; Received Radiation Immunizations Pneumococcal Vaccine: Received Pneumococcal Vaccination: Yes Implantable Devices Yes Hospitalization / Surgery History Type of Hospitalization/Surgery pacemaker IandD upper back abscess IandD perirectal abscess umbilical hernia repair cardiac cath Family and Social History Cancer: No; Diabetes: Yes - Maternal Grandparents; Heart Disease: No; Hereditary Spherocytosis: No; Hypertension: No; Kidney Disease: Yes - Siblings; Lung Disease: No; Seizures: Yes - Mother; Stroke: Yes - Mother; Thyroid Problems: No; Tuberculosis: No; Never smoker; Marital Status - Single; Alcohol Use: Never; Drug Use: No History; Caffeine Use: Moderate; Financial Concerns: No; Food, Clothing or Shelter Needs: No; Support System Lacking: No; Transportation Concerns: No Engineer, maintenance) Signed: 01/17/2020 5:03:09 PM By: Tobi Bastos MD, MBA Signed: 01/17/2020 5:05:13 PM By: Kela Millin Entered By: Kela Millin on 01/17/2020 08:29:20 -------------------------------------------------------------------------------- SuperBill Details Patient Name: Date of Service: [name] Bishop MS, [name] 01/17/2020 Medical Record Number:  [id number] Patient Account Number: [id number] Date of Birth/Sex: Treating RN: 07/11/1960 (59 y.o. [name] Primary Care Provider: Karle Plumber Other Clinician: Referring Provider: Treating Provider/Extender: Evlyn Kanner in Treatment: 0 Diagnosis Coding ICD-10 Codes Code Description [phone] Non-pressure chronic ulcer of left ankle limited to breakdown of skin I89.0 Lymphedema, not elsewhere classified W38.93 Chronic diastolic (congestive) heart failure E11.21 Type 2 diabetes mellitus with diabetic nephropathy Facility Procedures CPT4 Code: [id number] Description: [place] VISIT-LEV 4 EST PT Modifier: 25 Quantity: 1 CPT4 Code: 11572620 Description: (Facility Use Only) 29581LT - [place] LWR LT LEG Modifier: Quantity: 1 Physician Procedures : CPT4 Code Description Modifier [id number] - WC PHYS LEVEL 4 - NEW PT ICD-10 Diagnosis Description X64.680 Non-pressure chronic ulcer of left ankle limited to breakdown of skin Quantity: 1 Electronic Signature(s) Signed: 01/17/2020 5:03:09 PM By: Tobi Bastos MD, MBA Signed: 01/18/2020 5:19:54 PM By: Levan Hurst RN, BSN Previous Signature: 01/17/2020 8:53:35 AM Version By: Tobi Bastos MD, MBA Previous Signature: 01/17/2020 8:53:35 AM Version By: Tobi Bastos MD, MBA Entered  By: Levan Hurst on 01/17/2020 08:54:57

## 2020-01-18 NOTE — Progress Notes (Signed)
[name], [name] ([id number]) Visit Report for 01/17/2020 Allergy List Details Patient Name: Date of Service: [name] MSGreggory [name] 01/17/2020 9:00 A M Medical Record Number: [id number] Patient Account Number: [id number] Date of Birth/Sex: Treating RN: Oct 09, 1960 (59 y.o. [name] Primary Care Romano Stigger: Karle Plumber Other Clinician: Referring Rajohn Henery: Treating Glenn Gullickson/Extender: Evlyn Kanner in Treatment: 0 Allergies Active Allergies Shellfish Containing Products Reaction: itching Severity: Moderate lisinopril Reaction: anaphylaxis Severity: Severe Allergy Notes Electronic Signature(s) Signed: 01/17/2020 5:05:13 PM By: Kela Millin Entered By: Kela Millin on 01/17/2020 08:27:50 -------------------------------------------------------------------------------- Arrival Information Details Patient Name: Date of Service: [name] Bishop MS, [name] 01/17/2020 9:00 A M Medical Record Number: [id number] Patient Account Number: [id number] Date of Birth/Sex: Treating RN: 1961/06/03 (59 y.o. [name] Primary Care Tanasha Menees: Karle Plumber Other Clinician: Referring Iain Sawchuk: Treating Coby Antrobus/Extender: Evlyn Kanner in Treatment: 0 Visit Information Patient Arrived: Lyndel Pleasure Time: 08:20 Accompanied By: self Transfer Assistance: None Patient Identification Verified: Yes Secondary Verification Process Completed: Yes Patient Has Alerts: Yes Patient Alerts: Patient on Blood Thinner History Since Last Visit Electronic Signature(s) Signed: 01/17/2020 5:05:13 PM By: Kela Millin Entered By: Kela Millin on 01/17/2020 08:48:13 -------------------------------------------------------------------------------- Clinic Level of Care Assessment Details Patient Name: Date of Service: [employer] MS, [name] 01/17/2020 9:00 A M Medical Record Number: [id number] Patient Account Number: [id number] Date of  Birth/Sex: Treating RN: March 03, 1961 (59 y.o. [name] Primary Care Fin Hupp: Karle Plumber Other Clinician: Referring Deneise Getty: Treating Ovetta Bazzano/Extender: Evlyn Kanner in Treatment: 0 Clinic Level of Care Assessment Items TOOL 2 Quantity Score X- 1 0 Use when only an EandM is performed on the INITIAL visit ASSESSMENTS - Nursing Assessment / Reassessment X- 1 20 General Physical Exam (combine w/ comprehensive assessment (listed just below) when performed on new pt. evals) X- 1 25 Comprehensive Assessment (HX, ROS, Risk Assessments, Wounds Hx, etc.) ASSESSMENTS - Wound and Skin A ssessment / Reassessment X - Simple Wound Assessment / Reassessment - one wound 1 5 []  - 0 Complex Wound Assessment / Reassessment - multiple wounds []  - 0 Dermatologic / Skin Assessment (not related to wound area) ASSESSMENTS - Ostomy and/or Continence Assessment and Care []  - 0 Incontinence Assessment and Management []  - 0 Ostomy Care Assessment and Management (repouching, etc.) PROCESS - Coordination of Care X - Simple Patient / Family Education for ongoing care 1 15 []  - 0 Complex (extensive) Patient / Family Education for ongoing care X- 1 10 Staff obtains Programmer, systems, Records, T Results / Process Orders est []  - 0 Staff telephones HHA, Nursing Homes / Clarify orders / etc []  - 0 Routine Transfer to another Facility (non-emergent condition) []  - 0 Routine Hospital Admission (non-emergent condition) X- 1 15 New Admissions / Biomedical engineer / Ordering NPWT Apligraf, etc. , []  - 0 Emergency Hospital Admission (emergent condition) X- 1 10 Simple Discharge Coordination []  - 0 Complex (extensive) Discharge Coordination PROCESS - Special Needs []  - 0 Pediatric / Minor Patient Management []  - 0 Isolation Patient Management []  - 0 Hearing / Language / Visual special needs []  - 0 Assessment of Community assistance (transportation, D/C planning,  etc.) []  - 0 Additional assistance / Altered mentation []  - 0 Support Surface(s) Assessment (bed, cushion, seat, etc.) INTERVENTIONS - Wound Cleansing / Measurement X- 1 5 Wound Imaging (photographs - any number of wounds) []  - 0 Wound Tracing (instead of photographs) X- 1 5 Simple Wound Measurement - one wound []  - 0 Complex Wound  Measurement - multiple wounds X- 1 5 Simple Wound Cleansing - one wound []  - 0 Complex Wound Cleansing - multiple wounds INTERVENTIONS - Wound Dressings []  - 0 Small Wound Dressing one or multiple wounds []  - 0 Medium Wound Dressing one or multiple wounds X- 1 20 Large Wound Dressing one or multiple wounds []  - 0 Application of Medications - injection INTERVENTIONS - Miscellaneous []  - 0 External ear exam []  - 0 Specimen Collection (cultures, biopsies, blood, body fluids, etc.) []  - 0 Specimen(s) / Culture(s) sent or taken to Lab for analysis []  - 0 Patient Transfer (multiple staff / Harrel Lemon Lift / Similar devices) []  - 0 Simple Staple / Suture removal (25 or less) []  - 0 Complex Staple / Suture removal (26 or more) []  - 0 Hypo / Hyperglycemic Management (close monitor of Blood Glucose) []  - 0 Ankle / Brachial Index (ABI) - do not check if billed separately Has the patient been seen at the hospital within the last three years: Yes Total Score: 135 Level Of Care: New/Established - Level 4 Electronic Signature(s) Signed: 01/18/2020 5:19:54 PM By: Levan Hurst RN, BSN Entered By: Levan Hurst on 01/17/2020 08:45:22 -------------------------------------------------------------------------------- Compression Therapy Details Patient Name: Date of Service: [name] Bishop MS, [name] 01/17/2020 9:00 A M Medical Record Number: [id number] Patient Account Number: [id number] Date of Birth/Sex: Treating RN: 12-Oct-1960 (59 y.o. [name] Primary Care Sarkis Rhines: Karle Plumber Other Clinician: Referring Erickson Yamashiro: Treating Tiasha Helvie/Extender:  Evlyn Kanner in Treatment: 0 Compression Therapy Performed for Wound Assessment: Wound #4 Left,Anterior Ankle Performed By: Clinician Levan Hurst, RN Compression Type: Three Layer Post Procedure Diagnosis Same as Pre-procedure Electronic Signature(s) Signed: 01/18/2020 5:19:54 PM By: Levan Hurst RN, BSN Entered By: Levan Hurst on 01/17/2020 08:45:43 -------------------------------------------------------------------------------- Encounter Discharge Information Details Patient Name: Date of Service: [name] Bishop MS, [name] 01/17/2020 9:00 A M Medical Record Number: [id number] Patient Account Number: [id number] Date of Birth/Sex: Treating RN: 28-Dec-1960 (59 y.o. Ernestene Mention Primary Care Hernan Turnage: Karle Plumber Other Clinician: Referring Samyak Sackmann: Treating Ishitha Roper/Extender: Evlyn Kanner in Treatment: 0 Encounter Discharge Information Items Discharge Condition: Stable Ambulatory Status: Cane Discharge Destination: Home Transportation: Private Auto Accompanied By: self Schedule Follow-up Appointment: Yes Clinical Summary of Care: Patient Declined Electronic Signature(s) Signed: 01/18/2020 5:26:58 PM By: Baruch Gouty RN, BSN Entered By: Baruch Gouty on 01/17/2020 09:07:12 -------------------------------------------------------------------------------- Lower Extremity Assessment Details Patient Name: Date of Service: [name] Bishop MS, [name] 01/17/2020 9:00 A M Medical Record Number: [id number] Patient Account Number: [id number] Date of Birth/Sex: Treating RN: 27-Mar-1961 (59 y.o. [name] Primary Care Keithen Capo: Karle Plumber Other Clinician: Referring Fifi Schindler: Treating Rechelle Niebla/Extender: Evlyn Kanner in Treatment: 0 Edema Assessment Assessed: Shirlyn Goltz: No] Patrice Paradise: No] E[Left: dema] [Right: :] Calf Left: Right: Point of Measurement: 40 cm From Medial Instep 46 cm 41.5  cm Ankle Left: Right: Point of Measurement: 15 cm From Medial Instep 47 cm 44 cm Vascular Assessment Pulses: Dorsalis Pedis Palpable: [Left:No] [Right:No] Electronic Signature(s) Signed: 01/17/2020 5:05:13 PM By: Kela Millin Entered By: Kela Millin on 01/17/2020 08:32:47 -------------------------------------------------------------------------------- Multi-Disciplinary Care Plan Details Patient Name: Date of Service: [name] Bishop MS, [name] 01/17/2020 9:00 A M Medical Record Number: [id number] Patient Account Number: [id number] Date of Birth/Sex: Treating RN: May 22, 1961 (59 y.o. [name] Primary Care Tajae Maiolo: Karle Plumber Other Clinician: Referring Toyia Jelinek: Treating Betsabe Iglesia/Extender: Evlyn Kanner in Treatment: 0 Active Inactive Abuse / Safety / Falls / Self Care Management Nursing Diagnoses: Potential  for falls Goals: Patient will not experience any injury related to falls Date Initiated: 01/17/2020 Target Resolution Date: 02/15/2020 Goal Status: Active Patient/caregiver will verbalize/demonstrate measures taken to prevent injury and/or falls Date Initiated: 01/17/2020 Target Resolution Date: 02/15/2020 Goal Status: Active Interventions: Assess Activities of Daily Living upon admission and as needed Assess fall risk on admission and as needed Assess: immobility, friction, shearing, incontinence upon admission and as needed Assess impairment of mobility on admission and as needed per policy Assess personal safety and home safety (as indicated) on admission and as needed Assess self care needs on admission and as needed Provide education on fall prevention Provide education on personal and home safety Notes: Nutrition Nursing Diagnoses: Impaired glucose control: actual or potential Potential for alteratiion in Nutrition/Potential for imbalanced nutrition Goals: Patient/caregiver agrees to and verbalizes understanding of  need to use nutritional supplements and/or vitamins as prescribed Date Initiated: 01/17/2020 Target Resolution Date: 02/15/2020 Goal Status: Active Patient/caregiver will maintain therapeutic glucose control Date Initiated: 01/17/2020 Target Resolution Date: 02/15/2020 Goal Status: Active Interventions: Assess HgA1c results as ordered upon admission and as needed Assess patient nutrition upon admission and as needed per policy Provide education on elevated blood sugars and impact on wound healing Provide education on nutrition Notes: Wound/Skin Impairment Nursing Diagnoses: Impaired tissue integrity Knowledge deficit related to ulceration/compromised skin integrity Goals: Patient/caregiver will verbalize understanding of skin care regimen Date Initiated: 01/17/2020 Target Resolution Date: 02/15/2020 Goal Status: Active Ulcer/skin breakdown will have a volume reduction of 30% by week 4 Date Initiated: 01/17/2020 Target Resolution Date: 02/15/2020 Goal Status: Active Interventions: Assess patient/caregiver ability to obtain necessary supplies Assess patient/caregiver ability to perform ulcer/skin care regimen upon admission and as needed Assess ulceration(s) every visit Provide education on ulcer and skin care Notes: Electronic Signature(s) Signed: 01/18/2020 5:19:54 PM By: Levan Hurst RN, BSN Entered By: Levan Hurst on 01/17/2020 08:54:09 -------------------------------------------------------------------------------- Pain Assessment Details Patient Name: Date of Service: [name] Bishop MS, [name] 01/17/2020 9:00 A M Medical Record Number: [id number] Patient Account Number: [id number] Date of Birth/Sex: Treating RN: [birth year] (59 y.o. [name] Primary Care Janeese Mcgloin: Karle Plumber Other Clinician: Referring Ceci Taliaferro: Treating Marquavis Hannen/Extender: Evlyn Kanner in Treatment: 0 Active Problems Location of Pain Severity and Description of  Pain Patient Has Paino No Site Locations Pain Management and Medication Current Pain Management: Electronic Signature(s) Signed: 01/17/2020 5:05:13 PM By: Kela Millin Entered By: Kela Millin on 01/17/2020 08:34:48 -------------------------------------------------------------------------------- Patient/Caregiver Education Details Patient Name: Date of Service: Gladys Damme, [name] 8/12/2021andnbsp9:00 A M Medical Record Number: [id number] Patient Account Number: [id number] Date of Birth/Gender: Treating RN: May 29, 1961 (59 y.o. [name] Primary Care Physician: Karle Plumber Other Clinician: Referring Physician: Treating Physician/Extender: Evlyn Kanner in Treatment: 0 Education Assessment Education Provided To: Patient Education Topics Provided Elevated Blood Sugar/ Impact on Healing: Methods: Explain/Verbal Responses: State content correctly Nutrition: Methods: Explain/Verbal Responses: State content correctly Safety: Methods: Explain/Verbal Responses: State content correctly Wound/Skin Impairment: Methods: Explain/Verbal Responses: State content correctly Electronic Signature(s) Signed: 01/18/2020 5:19:54 PM By: Levan Hurst RN, BSN Entered By: Levan Hurst on 01/17/2020 08:54:19 -------------------------------------------------------------------------------- Wound Assessment Details Patient Name: Date of Service: [name] Bishop MS, [name] 01/17/2020 9:00 A M Medical Record Number: [id number] Patient Account Number: [id number] Date of Birth/Sex: Treating RN: 05/17/1961 (59 y.o. [name] Primary Care Lakecia Deschamps: Karle Plumber Other Clinician: Referring Josanna Hefel: Treating Neidra Girvan/Extender: Evlyn Kanner in Treatment: 0 Wound Status Wound Number: 4 Primary Lymphedema Etiology: Wound Location: Left,  Anterior Ankle Wound Open Wounding Event: Gradually Appeared Status: Date  Acquired: 01/06/2020 Comorbid Lymphedema, Arrhythmia, Congestive Heart Failure, Weeks Of Treatment: 0 History: Hypertension, Type II Diabetes, End Stage Renal Disease, Gout Clustered Wound: No Photos Photo Uploaded By: Mikeal Hawthorne on 01/17/2020 15:57:49 Wound Measurements Length: (cm) 0.8 Width: (cm) 4 Depth: (cm) 0.1 Area: (cm) 2.513 Volume: (cm) 0.251 % Reduction in Area: % Reduction in Volume: Epithelialization: None Tunneling: No Undermining: No Wound Description Classification: Full Thickness Without Exposed Support Structures Wound Margin: Distinct, outline attached Exudate Amount: Small Exudate Type: Serosanguineous Exudate Color: red, brown Foul Odor After Cleansing: No Slough/Fibrino Yes Wound Bed Granulation Amount: Large (67-100%) Exposed Structure Granulation Quality: Red, Pink Fascia Exposed: No Necrotic Amount: Small (1-33%) Fat Layer (Subcutaneous Tissue) Exposed: Yes Necrotic Quality: Adherent Slough Tendon Exposed: No Muscle Exposed: No Joint Exposed: No Bone Exposed: No Treatment Notes Wound #4 (Left, Anterior Ankle) 2. Periwound Care Moisturizing lotion 3. Primary Dressing Applied Calcium Alginate Ag 4. Secondary Dressing ABD Pad 6. Support Layer Applied 3 layer compression wrap Electronic Signature(s) Signed: 01/17/2020 5:05:13 PM By: Kela Millin Entered By: Kela Millin on 01/17/2020 08:34:27 -------------------------------------------------------------------------------- Vitals Details Patient Name: Date of Service: [name] Bishop MS, [name] 01/17/2020 9:00 A M Medical Record Number: [id number] Patient Account Number: [id number] Date of Birth/Sex: Treating RN: [birth year] (59 y.o. [name] Primary Care Randall Rampersad: Karle Plumber Other Clinician: Referring Audreena Sachdeva: Treating Errika Narvaiz/Extender: Evlyn Kanner in Treatment: 0 Vital Signs Time Taken: 08:22 Temperature (F): 98.9 Height (in):  70 Pulse (bpm): 64 Source: Stated Respiratory Rate (breaths/min): 19 Weight (lbs): 284 Blood Pressure (mmHg): 135/83 Source: Stated Capillary Blood Glucose (mg/dl): 121 Body Mass Index (BMI): 40.7 Reference Range: 80 - 120 mg / dl Notes patient stated CBG was 121 Electronic Signature(s) Signed: 01/17/2020 5:05:13 PM By: Kela Millin Entered By: Kela Millin on 01/17/2020 08:26:38

## 2020-01-21 ENCOUNTER — Encounter (HOSPITAL_BASED_OUTPATIENT_CLINIC_OR_DEPARTMENT_OTHER): Payer: Medicare HMO | Admitting: Internal Medicine

## 2020-01-24 ENCOUNTER — Ambulatory Visit: Payer: Medicare HMO | Admitting: Pharmacist

## 2020-01-24 ENCOUNTER — Other Ambulatory Visit: Payer: Self-pay

## 2020-01-24 ENCOUNTER — Encounter (HOSPITAL_BASED_OUTPATIENT_CLINIC_OR_DEPARTMENT_OTHER): Payer: Medicare HMO | Admitting: Internal Medicine

## 2020-01-24 ENCOUNTER — Ambulatory Visit: Payer: Medicare HMO | Attending: Internal Medicine | Admitting: Internal Medicine

## 2020-01-24 ENCOUNTER — Encounter: Payer: Self-pay | Admitting: Internal Medicine

## 2020-01-24 VITALS — BP 120/82 | HR 68 | Temp 99.1°F | Resp 16 | Wt 280.6 lb

## 2020-01-24 DIAGNOSIS — Z6841 Body Mass Index (BMI) 40.0 and over, adult: Secondary | ICD-10-CM

## 2020-01-24 DIAGNOSIS — R358 Other polyuria: Secondary | ICD-10-CM

## 2020-01-24 DIAGNOSIS — E1122 Type 2 diabetes mellitus with diabetic chronic kidney disease: Secondary | ICD-10-CM | POA: Diagnosis not present

## 2020-01-24 DIAGNOSIS — R3589 Other polyuria: Secondary | ICD-10-CM

## 2020-01-24 DIAGNOSIS — I890 Lymphedema, not elsewhere classified: Secondary | ICD-10-CM

## 2020-01-24 DIAGNOSIS — J449 Chronic obstructive pulmonary disease, unspecified: Secondary | ICD-10-CM | POA: Diagnosis not present

## 2020-01-24 DIAGNOSIS — I5032 Chronic diastolic (congestive) heart failure: Secondary | ICD-10-CM

## 2020-01-24 DIAGNOSIS — Z794 Long term (current) use of insulin: Secondary | ICD-10-CM | POA: Diagnosis not present

## 2020-01-24 DIAGNOSIS — Z125 Encounter for screening for malignant neoplasm of prostate: Secondary | ICD-10-CM

## 2020-01-24 DIAGNOSIS — E11622 Type 2 diabetes mellitus with other skin ulcer: Secondary | ICD-10-CM | POA: Diagnosis not present

## 2020-01-24 DIAGNOSIS — J45909 Unspecified asthma, uncomplicated: Secondary | ICD-10-CM | POA: Insufficient documentation

## 2020-01-24 DIAGNOSIS — Z23 Encounter for immunization: Secondary | ICD-10-CM | POA: Diagnosis not present

## 2020-01-24 DIAGNOSIS — E1159 Type 2 diabetes mellitus with other circulatory complications: Secondary | ICD-10-CM | POA: Diagnosis not present

## 2020-01-24 DIAGNOSIS — N186 End stage renal disease: Secondary | ICD-10-CM | POA: Diagnosis not present

## 2020-01-24 DIAGNOSIS — L97321 Non-pressure chronic ulcer of left ankle limited to breakdown of skin: Secondary | ICD-10-CM | POA: Diagnosis not present

## 2020-01-24 DIAGNOSIS — J4520 Mild intermittent asthma, uncomplicated: Secondary | ICD-10-CM

## 2020-01-24 DIAGNOSIS — M109 Gout, unspecified: Secondary | ICD-10-CM | POA: Diagnosis not present

## 2020-01-24 DIAGNOSIS — I132 Hypertensive heart and chronic kidney disease with heart failure and with stage 5 chronic kidney disease, or end stage renal disease: Secondary | ICD-10-CM | POA: Diagnosis not present

## 2020-01-24 DIAGNOSIS — I10 Essential (primary) hypertension: Secondary | ICD-10-CM | POA: Diagnosis not present

## 2020-01-24 DIAGNOSIS — E1151 Type 2 diabetes mellitus with diabetic peripheral angiopathy without gangrene: Secondary | ICD-10-CM | POA: Diagnosis not present

## 2020-01-24 LAB — POCT GLYCOSYLATED HEMOGLOBIN (HGB A1C): HbA1c, POC (controlled diabetic range): 7.4 % — AB (ref 0.0–7.0)

## 2020-01-24 LAB — GLUCOSE, POCT (MANUAL RESULT ENTRY): POC Glucose: 209 mg/dl — AB (ref 70–99)

## 2020-01-24 MED ORDER — DOXAZOSIN MESYLATE 2 MG PO TABS: 1.0000 mg | ORAL_TABLET | Freq: Every day | ORAL | 2 refills | Status: DC

## 2020-01-24 NOTE — Progress Notes (Signed)
[name], male    DOB: [birth year]  MRN: [id number]  CC: Diabetes and Hypertension   Subjective: [name] is a 59 y.o. male who presents for chronic ds management His concerns today include:  Patient with history of diabetes type 2 with microalbuminuria, diastolic CHF with pacemaker (secconary to AV block with syncopy), CKD stage III, HOCM, HTN, HL,CVA,lymphedema. 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  Lymphedema:  Had skin break down again b/w skin fold LT foot.  Went back to the wound center and was seen.  It has since healed.    DIABETES TYPE 2 Last A1C:   Results for orders placed or performed in visit on 01/24/20  POCT glucose (manual entry)  Result Value Ref Range   POC Glucose 209 (A) 70 - 99 mg/dl  POCT glycosylated hemoglobin (Hb A1C)  Result Value Ref Range   Hemoglobin A1C     HbA1c POC (<> result, manual entry)     HbA1c, POC (prediabetic range)     HbA1c, POC (controlled diabetic range) 7.4 (A) 0.0 - 7.0 %   A1C down 2 points from 08/2019 Med Adherence:  [x]  Yes.  He is on NovoLog 70/30 28 units twice a day    []  No Medication side effects:  []  Yes    [x]  No Home Monitoring?  [x]  Yes - every morning Home glucose results range: less than 120 most of the times Diet Adherence: [x]  Yes.  Occasionally drinks grape juice and drank a little before coming today. Exercise: "Not anything steady but has a lot of yard work to catch up with."  He has gym membership but afraid of doing too much walking as his balance still feels a little off since his stroke Hypoglycemic episodes?: [x]  Yes - BS 60 2 wks ago Numbness of the feet? []  Yes    [x]  No Retinopathy hx? []  Yes    []  No Last eye exam: up to date Comments:   HTN/diastolic CHF:  No device to check BP.  Compliant with meds and salt restriction.  Took medications already for today.  Limits salt in foods.  No CP or shortness of breath  No hx of COPD/asthma on  problem list.  Patient states he never had COPD.  He has albuterol inhaler on his list to use as needed during pollen season.  Has seasonal allergic symptoms to high pollen count requiring use of albuterol.  Complains of frequent urination over the past several weeks.  He states that just about every 8 minutes he has to urinate.  Frequent urination occurs only during the day but not at nights.  He drinks about 8 glasses of fluids during the day.  He does not drink caffeinated beverages.  Denies any incontinence of urine.  When he goes to the restroom to urinate it takes about 20 seconds before his flow gets started.  However once it starts it flows freely.  After he urinates he feels like he has not completely emptied.  No blood in the urine.  No dysuria..   Patient Active Problem List   Diagnosis Date Noted  . Pain due to onychomycosis of toenails of both feet 10/17/2019  . Morbid obesity ([place]) 08/23/2019  . 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 CVA (cerebral vascular accident) ([place]) 10/20/2018  . UTI (urinary tract infection) 10/19/2018  . Upper airway cough syndrome 08/01/2018  . Lymphedema 10/06/2016  . Gout, arthritis   . Anorectal  fistula 06/23/2015  . Onychomycosis of toenail 09/26/2014  . Diabetes mellitus type 2 in obese ([place])   . Iron deficiency anemia 07/29/2014  . CKD (chronic kidney disease), stage III ([place])   . Chronic diastolic HF (heart failure) ([place]) 03/27/2013  . Peripheral edema 07/20/2012  . Obstructive sleep apnea, suspected 06/21/2012  . Pacemaker-St.Jude 02/23/2012  . Hypertension 08/09/2011  . Mobitz type II atrioventricular block 05/19/2011  . 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[place]) 05/19/2011     Current Outpatient Medications on File Prior to Visit  Medication Sig Dispense Refill  . allopurinol (ZYLOPRIM) 100 MG tablet TAKE 3 TABLETS BY MOUTH EVERY DAY 270 tablet 1  . Accu-Chek FastClix Lancets MISC 1 each by Other route 3 (three) times daily. 102 each 12  . acetaminophen  (TYLENOL) 325 MG tablet Take 1-2 tablets (325-650 mg total) by mouth every 4 (four) hours as needed for mild pain.    . Agalsidase beta (FABRAZYME IV) Inject 115 mg into the vein every 14 (fourteen) days.     [name] Kitchen albuterol (PROAIR HFA) 108 (90 Base) MCG/ACT inhaler Inhale 1 puff into the lungs every 6 (six) hours as needed for wheezing or shortness of breath. 8.5 Inhaler 1  . amLODipine (NORVASC) 10 MG tablet TAKE 1 TABLET BY MOUTH EVERY DAY 90 tablet 2  . atorvastatin (LIPITOR) 80 MG tablet TAKE 1 TABLET (80 MG TOTAL) BY MOUTH DAILY AT 6 PM. 90 tablet 2  . clopidogrel (PLAVIX) 75 MG tablet TAKE 1 TABLET BY MOUTH EVERY DAY 90 tablet 2  . fexofenadine (ALLEGRA) 180 MG tablet Take 1 tablet (180 mg total) by mouth daily. 30 tablet 3  . fluticasone (FLONASE) 50 MCG/ACT nasal spray Place 1 spray into both nostrils daily. (Patient taking differently: Place 1 spray into both nostrils daily as needed for allergies. ) 16 g 1  . furosemide (LASIX) 20 MG tablet TAKE 1 TABLET BY MOUTH EVERY DAY 90 tablet 2  . glucose blood (ACCU-CHEK AVIVA PLUS) test strip 1 each by Other route 3 (three) times daily. Use as instructed 100 each 12  . insulin aspart protamine - aspart (NOVOLOG MIX 70/30 FLEXPEN) (70-30) 100 UNIT/ML FlexPen Inject 0.28 mLs (28 Units total) into the skin 2 (two) times daily. 15 mL 11  . Insulin Pen Needle (PX SHORTLENGTH PEN NEEDLES) 31G X 8 MM MISC 1 each by Does not apply route 2 (two) times daily before a meal. 100 each 8  . losartan-hydrochlorothiazide (HYZAAR) 50-12.5 MG tablet Take 1 tablet by mouth daily. 90 tablet 0  . mupirocin ointment (BACTROBAN) 2 % Apply to affected area daily with dressing change. 30 g 0  . OLIVE LEAF EXTRACT PO Take 1 capsule by mouth daily.     No current facility-administered medications on file prior to visit.    Allergies  Allergen Reactions  . Lisinopril Anaphylaxis  . Shellfish Allergy Hives and Swelling    Social History   Socioeconomic History  .  Marital status: Divorced    Spouse name: Not on file  . Number of children: Not on file  . Years of education: Not on file  . Highest education level: Not on file  Occupational History  . Not on file  Tobacco Use  . Smoking status: Never Smoker  . Smokeless tobacco: Never Used  Vaping Use  . Vaping Use: Never used  Substance and Sexual Activity  . Alcohol use: Yes    Comment: 07/23/2014 "might have a few drinks on holidays or at cookouts"  .  Drug use: No  . Sexual activity: Yes  Other Topics Concern  . 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 Difficulty of Paying Living Expenses: Not on file  Food Insecurity:   . Worried About Charity fundraiser in the Last Year: Not on file  . Ran Out of Food in the Last Year: Not on file  Transportation Needs:   . Lack of Transportation (Medical): Not on file  . Lack of Transportation (Non-Medical): Not on file  Physical Activity:   . Days of Exercise per Week: Not on file  . Minutes of Exercise per Session: Not on file  Stress:   . Feeling of Stress : Not on file  Social Connections:   . Frequency of Communication with Friends and Family: Not on file  . Frequency of Social Gatherings with Friends and Family: Not on file  . Attends Religious Services: Not on file  . Active Member of Clubs or Organizations: Not on file  . Attends Archivist Meetings: Not on file  . Marital Status: Not on file  Intimate Partner Violence:   . Fear of Current or Ex-Partner: Not on file  . Emotionally Abused: Not on file  . Physically Abused: Not on file  . Sexually Abused: Not on file    Family History  Problem Relation Age of Onset  . Stroke Mother   . Fabry's disease Mother   . Fabry's disease Brother   . Colon cancer Neg Hx     Past Surgical History:  Procedure Laterality Date  . CARDIAC CATHETERIZATION  03-12-2011  DR Daneen Schick    HYPERTROPHIC CARDIOMYOPATHY WITH LV CAVITY  APPEARANCE CONSISTANT WITH SIGNIFICANT APICAL HYPERTROPHY/ NORMAL LVSF / EF 55%/ EVIDENCE OF DIASTOLIC DYSFUNCTION WITH EDP OF 23-44mmHg AFTER A-WAVE/ NORMAL CORONARY ARTERIES  . EVALUATION UNDER ANESTHESIA WITH FISTULECTOMY N/A 06/23/2015   Procedure: EXAM UNDER ANESTHESIA WITH POSSIBLE FISTULOTOMY;  Surgeon: Judeth Horn, MD;  Location: [facility];  Service: General;  Laterality: N/A;  . HERNIA REPAIR  1610   Umbilical Hernia Repair  . INCISION AND DRAINAGE PERIRECTAL ABSCESS Left 07/29/2014   Procedure: IRRIGATION AND DEBRIDEMENT PERIRECTAL ABSCESS;  Surgeon: Doreen Salvage, MD;  Location: [facility];  Service: General;  Laterality: Left;  Prone position  . INSERT / REPLACE / REMOVE PACEMAKER  02/12/2011   SJM implanted by Dr Rayann Heman for Mobitz II second degree AV block and syncope  . IRRIGATION AND DEBRIDEMENT ABSCESS N/A 07/27/2013   Procedure: IRRIGATION AND DEBRIDEMENT OF SKIN, SOFT TISSUE AND MUSCLES OF UPPER BACK (11X19X4cm) WITH 10 BLADE AND PULSATILE LAVAGE ;  Surgeon: Gayland Curry, MD;  Location: [facility];  Service: General;  Laterality: N/A;  . PPM GENERATOR CHANGEOUT N/A 05/22/2019   Procedure: PPM GENERATOR CHANGEOUT;  Surgeon: Thompson Grayer, MD;  Location: [facility];  Service: Cardiovascular;  Laterality: N/A;  . UMBILICAL HERNIA REPAIR  03/22/2012   Procedure: HERNIA REPAIR UMBILICAL ADULT;  Surgeon: Leighton Ruff, MD;  Location: [facility];  Service: General;  Laterality: N/A;  Umbilical Hernia Repair     ROS: Review of Systems Negative except as stated above  PHYSICAL EXAM: BP 120/82   Pulse 68   Temp 99.1 F (37.3 C)   Resp 16   Wt 280 lb 9.6 oz (127.3 kg)   SpO2 94%   BMI 40.26 kg/m   Wt Readings from Last  3 Encounters:  01/24/20 280 lb 9.6 oz (127.3 kg)  10/11/19 299 lb 6.4 oz (135.8 kg)  09/05/19 284 lb (128.8 kg)    Physical Exam  General appearance - alert, well appearing, and in no distress Mental status - normal mood,  behavior, speech, dress, motor activity, and thought processes Neck - supple, no significant adenopathy Chest -breath sounds slightly decreased but no wheezes crackles or rhonchi's.   Heart - normal rate, regular rhythm, normal S1, S2, no murmurs, rubs, clicks or gallops Extremities -has lymphedema of both legs.  The right leg is wrapped.  Skin fold between the foot and the ankle on the Left leg is healed.  CMP Latest Ref Rng & Units 08/23/2019 05/22/2019 05/22/2019  Glucose 65 - 99 mg/dL 147(H) 133(H) 138(H)  BUN 6 - 24 mg/dL 32(H) 36(H) 34(H)  Creatinine 0.76 - 1.27 mg/dL 2.02(H) 2.00(H) 2.23(H)  Sodium 134 - 144 mmol/L 143 142 140  Potassium 3.5 - 5.2 mmol/L 4.6 4.0 6.6(HH)  Chloride 96 - 106 mmol/L 105 107 106  CO2 20 - 29 mmol/L 21 - 21(L)  Calcium 8.7 - 10.2 mg/dL 9.8 - 9.1  Total Protein 6.0 - 8.5 g/dL 7.5 - -  Total Bilirubin 0.0 - 1.2 mg/dL 0.4 - -  Alkaline Phos 39 - 117 IU/L 126(H) - -  AST 0 - 40 IU/L 41(H) - -  ALT 0 - 44 IU/L 43 - -   Lipid Panel     Component Value Date/Time   CHOL 159 08/23/2019 1152   TRIG 63 08/23/2019 1152   HDL 59 08/23/2019 1152   CHOLHDL 2.7 08/23/2019 1152   CHOLHDL 4.4 10/20/2018 0324   VLDL 18 10/20/2018 0324   LDLCALC 87 08/23/2019 1152    CBC    Component Value Date/Time   WBC 7.4 08/23/2019 1152   WBC 7.0 05/22/2019 1201   RBC 5.09 08/23/2019 1152   RBC 5.29 05/22/2019 1201   HGB 14.7 08/23/2019 1152   HCT 44.2 08/23/2019 1152   PLT 193 05/22/2019 1201   PLT 221 02/13/2019 0854   MCV 87 08/23/2019 1152   MCH 28.9 08/23/2019 1152   MCH 29.3 05/22/2019 1201   MCHC 33.3 08/23/2019 1152   MCHC 32.1 05/22/2019 1201   RDW 13.2 08/23/2019 1152   LYMPHSABS 2.9 08/23/2019 1152   MONOABS 1.3 (H) 10/24/2018 0515   EOSABS 0.3 08/23/2019 1152   BASOSABS 0.1 08/23/2019 1152    ASSESSMENT AND PLAN: 1. Type 2 diabetes mellitus with other circulatory complication, with long-term current use of insulin ([place]) Commended him on  improvement on his A1c. Dietary counseling given.  Advised to avoid sugary drinks. Discussed exercises that he may be able to do.  He will try using the stationary bike or the stationary leg exercises at the gym Continue NovoLog 70/30 28 units twice a day. - POCT glucose (manual entry) - POCT glycosylated hemoglobin (Hb A1C)  2. Essential hypertension Close to goal.  Continue losartan/HCTZ, furosemide and amlodipine  3. Chronic diastolic (congestive) heart failure (HCC) Stable.  Continue current medications as listed above  4. Polyuria Advised patient to avoid drinking excess fluid during the day.  We will check a PSA and a urinalysis.  Even though the polyuria occurs mainly during the day some of his symptoms do sound like BPH.  We will try him on a low-dose of Cardura.  Flomax not covered by his insurance.  Advised to go slow with position changes especially if getting up in the  middle of the night - PSA - doxazosin (CARDURA) 2 MG tablet; Take 0.5 tablets (1 mg total) by mouth daily.  Dispense: 15 tablet; Refill: 2 - Urinalysis, Routine w reflex microscopic; Future  5. Lymphedema Patient will continue to use leg wraps and compression stockings  6. Class 3 severe obesity due to excess calories with serious comorbidity and body mass index (BMI) of 40.0 to 44.9 in adult [employer]) See #1 above  7. Mild intermittent reactive airway disease without complication Continue albuterol as needed.  8. Need for influenza vaccination Given today  9. Prostate cancer screening Discussed prostate cancer screening.  Patient agreeable to screening. - PSA     Patient was given the opportunity to ask questions.  Patient verbalized understanding of the plan and was able to repeat key elements of the plan.   Orders Placed This Encounter  Procedures  . PSA  . Urinalysis, Routine w reflex microscopic  . POCT glucose (manual entry)  . POCT glycosylated hemoglobin (Hb A1C)     Requested  Prescriptions   Signed Prescriptions Disp Refills  . doxazosin (CARDURA) 2 MG tablet 15 tablet 2    Sig: Take 0.5 tablets (1 mg total) by mouth daily.    Return in about 4 months (around 05/25/2020).  Karle Plumber, MD, FACP

## 2020-01-24 NOTE — Progress Notes (Signed)
LEODIS, ALCOCER ([id number]) Visit Report for 01/24/2020 Arrival Information Details Patient Name: Date of Service: [name], [name] 01/24/2020 8:15 A M Medical Record Number: [id number] Patient Account Number: [id number] Date of Birth/Sex: Treating RN: 07-10-1960 (59 y.o. [name] Primary Care Yasuko Lapage: Karle Plumber Other Clinician: Referring Delron Comer: Treating Little Bashore/Extender: Nyra Market in Treatment: 1 Visit Information History Since Last Visit Added or deleted any medications: No Patient Arrived: Kasandra Knudsen Any new allergies or adverse reactions: No Arrival Time: 08:49 Had a fall or experienced change in No Accompanied By: self activities of daily living that may affect Transfer Assistance: None risk of falls: Patient Identification Verified: Yes Signs or symptoms of abuse/neglect since last visito No Secondary Verification Process Completed: Yes Hospitalized since last visit: No Patient Has Alerts: Yes Implantable device outside of the clinic excluding No Patient Alerts: Patient on Blood Thinner cellular tissue based products placed in the center since last visit: Has Dressing in Place as Prescribed: Yes Has Compression in Place as Prescribed: Yes Pain Present Now: No Electronic Signature(s) Signed: 01/24/2020 5:22:44 PM By: Kela Millin Entered By: Kela Millin on 01/24/2020 08:53:03 -------------------------------------------------------------------------------- Clinic Level of Care Assessment Details Patient Name: Date of Service: [name] 01/24/2020 8:15 A M Medical Record Number: [id number] Patient Account Number: [id number] Date of Birth/Sex: Treating RN: 1961/05/25 (59 y.o. [name] Primary Care Burdett Pinzon: Karle Plumber Other Clinician: Referring Esker Dever: Treating Perris Tripathi/Extender: Nyra Market in Treatment: 1 Clinic Level of Care Assessment Items TOOL 4  Quantity Score X- 1 0 Use when only an EandM is performed on FOLLOW-UP visit ASSESSMENTS - Nursing Assessment / Reassessment X- 1 10 Reassessment of Co-morbidities (includes updates in patient status) X- 1 5 Reassessment of Adherence to Treatment Plan ASSESSMENTS - Wound and Skin A ssessment / Reassessment X - Simple Wound Assessment / Reassessment - one wound 1 5 []  - 0 Complex Wound Assessment / Reassessment - multiple wounds []  - 0 Dermatologic / Skin Assessment (not related to wound area) ASSESSMENTS - Focused Assessment []  - 0 Circumferential Edema Measurements - multi extremities []  - 0 Nutritional Assessment / Counseling / Intervention X- 1 5 Lower Extremity Assessment (monofilament, tuning fork, pulses) []  - 0 Peripheral Arterial Disease Assessment (using hand held doppler) ASSESSMENTS - Ostomy and/or Continence Assessment and Care []  - 0 Incontinence Assessment and Management []  - 0 Ostomy Care Assessment and Management (repouching, etc.) PROCESS - Coordination of Care X - Simple Patient / Family Education for ongoing care 1 15 []  - 0 Complex (extensive) Patient / Family Education for ongoing care X- 1 10 Staff obtains Programmer, systems, Records, T Results / Process Orders est []  - 0 Staff telephones HHA, Nursing Homes / Clarify orders / etc []  - 0 Routine Transfer to another Facility (non-emergent condition) []  - 0 Routine Hospital Admission (non-emergent condition) []  - 0 New Admissions / Biomedical engineer / Ordering NPWT Apligraf, etc. , []  - 0 Emergency Hospital Admission (emergent condition) X- 1 10 Simple Discharge Coordination []  - 0 Complex (extensive) Discharge Coordination PROCESS - Special Needs []  - 0 Pediatric / Minor Patient Management []  - 0 Isolation Patient Management []  - 0 Hearing / Language / Visual special needs []  - 0 Assessment of Community assistance (transportation, D/C planning, etc.) []  - 0 Additional assistance / Altered  mentation []  - 0 Support Surface(s) Assessment (bed, cushion, seat, etc.) INTERVENTIONS - Wound Cleansing / Measurement X - Simple Wound Cleansing - one wound 1  5 []  - 0 Complex Wound Cleansing - multiple wounds X- 1 5 Wound Imaging (photographs - any number of wounds) []  - 0 Wound Tracing (instead of photographs) X- 1 5 Simple Wound Measurement - one wound []  - 0 Complex Wound Measurement - multiple wounds INTERVENTIONS - Wound Dressings []  - 0 Small Wound Dressing one or multiple wounds []  - 0 Medium Wound Dressing one or multiple wounds []  - 0 Large Wound Dressing one or multiple wounds []  - 0 Application of Medications - topical []  - 0 Application of Medications - injection INTERVENTIONS - Miscellaneous []  - 0 External ear exam []  - 0 Specimen Collection (cultures, biopsies, blood, body fluids, etc.) []  - 0 Specimen(s) / Culture(s) sent or taken to Lab for analysis []  - 0 Patient Transfer (multiple staff / Civil Service fast streamer / Similar devices) []  - 0 Simple Staple / Suture removal (25 or less) []  - 0 Complex Staple / Suture removal (26 or more) []  - 0 Hypo / Hyperglycemic Management (close monitor of Blood Glucose) []  - 0 Ankle / Brachial Index (ABI) - do not check if billed separately X- 1 5 Vital Signs Has the patient been seen at the hospital within the last three years: Yes Total Score: 80 Level Of Care: New/Established - Level 3 Electronic Signature(s) Signed: 01/24/2020 5:36:57 PM By: Levan Hurst RN, BSN Entered By: Levan Hurst on 01/24/2020 09:21:48 -------------------------------------------------------------------------------- Lower Extremity Assessment Details Patient Name: Date of Service: [name], [name] 01/24/2020 8:15 A M Medical Record Number: [id number] Patient Account Number: [id number] Date of Birth/Sex: Treating RN: 20-Jun-1960 (59 y.o. [name] Primary Care Edis Huish: Karle Plumber Other Clinician: Referring  Jalesa Thien: Treating Carrigan Delafuente/Extender: Nyra Market in Treatment: 1 Edema Assessment Assessed: Shirlyn Goltz: No] Patrice Paradise: No] Edema: [Left: Ye] [Right: s] Calf Left: Right: Point of Measurement: 40 cm From Medial Instep 47 cm cm Ankle Left: Right: Point of Measurement: 15 cm From Medial Instep 42.5 cm cm Vascular Assessment Pulses: Dorsalis Pedis Palpable: [Left:No] Electronic Signature(s) Signed: 01/24/2020 5:22:44 PM By: Kela Millin Entered By: Kela Millin on 01/24/2020 08:54:07 -------------------------------------------------------------------------------- Multi Wound Chart Details Patient Name: Date of Service: [name], [name] 01/24/2020 8:15 A M Medical Record Number: [id number] Patient Account Number: [id number] Date of Birth/Sex: Treating RN: March 12, 1961 (59 y.o. [name] Primary Care Rosey Eide: Karle Plumber Other Clinician: Referring Teoman Giraud: Treating Ryley Bachtel/Extender: Nyra Market in Treatment: 1 Vital Signs Height(in): 70 Capillary Blood Glucose(mg/dl): 121 Weight(lbs): 284 Pulse(bpm): 87 Body Mass Index(BMI): 41 Blood Pressure(mmHg): 136/86 Temperature(F): 98.3 Respiratory Rate(breaths/min): 19 Photos: [4:No Photos Left, Anterior Ankle] [N/A:N/A N/A] Wound Location: [4:Gradually Appeared] [N/A:N/A] Wounding Event: [4:Lymphedema] [N/A:N/A] Primary Etiology: [4:Lymphedema, Arrhythmia, Congestive N/A] Comorbid History: [4:Heart Failure, Hypertension, Type II Diabetes, End Stage Renal Disease, Gout 01/06/2020] [N/A:N/A] Date Acquired: [4:1] [N/A:N/A] Weeks of Treatment: [4:Open] [N/A:N/A] Wound Status: [4:0x0x0] [N/A:N/A] Measurements L x W x D (cm) [4:0] [N/A:N/A] A (cm) : rea [4:0] [N/A:N/A] Volume (cm) : [4:100.00%] [N/A:N/A] % Reduction in Area: [4:100.00%] [N/A:N/A] % Reduction in Volume: [4:Full Thickness Without Exposed] [N/A:N/A] Classification: [4:Support Structures None  Present] [N/A:N/A] Exudate Amount: [4:Distinct, outline attached] [N/A:N/A] Wound Margin: [4:None Present (0%)] [N/A:N/A] Granulation Amount: [4:None Present (0%)] [N/A:N/A] Necrotic Amount: [4:Fascia: No] [N/A:N/A] Exposed Structures: [4:Fat Layer (Subcutaneous Tissue) Exposed: No Tendon: No Muscle: No Joint: No Bone: No Large (67-100%)] [N/A:N/A] Treatment Notes Electronic Signature(s) Signed: 01/24/2020 5:36:57 PM By: Levan Hurst RN, BSN Signed: 01/24/2020 5:57:44 PM By: [name] MD Entered By: Dellia Nims,  Legrand Como on 01/24/2020 09:28:47 -------------------------------------------------------------------------------- Multi-Disciplinary Care Plan Details Patient Name: Date of Service: Lyman Bishop MSGreggory [name] 01/24/2020 8:15 A M Medical Record Number: [id number] Patient Account Number: [id number] Date of Birth/Sex: Treating RN: 1960-08-27 (59 y.o. [name] Primary Care Tavares Levinson: Karle Plumber Other Clinician: Referring Darling Cieslewicz: Treating Chastity Noland/Extender: Nyra Market in Treatment: 1 Active Inactive Electronic Signature(s) Signed: 01/24/2020 5:36:57 PM By: Levan Hurst RN, BSN Entered By: Levan Hurst on 01/24/2020 09:21:26 -------------------------------------------------------------------------------- Pain Assessment Details Patient Name: Date of Service: [name], [name] 01/24/2020 8:15 A M Medical Record Number: [id number] Patient Account Number: [id number] Date of Birth/Sex: Treating RN: 1960/07/10 (59 y.o. [name] Primary Care Corrin Sieling: Karle Plumber Other Clinician: Referring Danese Dorsainvil: Treating Salvadore Valvano/Extender: Nyra Market in Treatment: 1 Active Problems Location of Pain Severity and Description of Pain Patient Has Paino No Site Locations Pain Management and Medication Current Pain Management: Electronic Signature(s) Signed: 01/24/2020 5:22:44 PM By: Kela Millin Entered By: Kela Millin on 01/24/2020 08:53:42 -------------------------------------------------------------------------------- Patient/Caregiver Education Details Patient Name: Date of Service: Gladys Damme, [name] 8/19/2021andnbsp8:15 A M Medical Record Number: [id number] Patient Account Number: [id number] Date of Birth/Gender: Treating RN: 12-16-1960 (59 y.o. [name] Primary Care Physician: Karle Plumber Other Clinician: Referring Physician: Treating Physician/Extender: Nyra Market in Treatment: 1 Education Assessment Education Provided To: Patient Education Topics Provided Safety: Methods: Explain/Verbal Responses: State content correctly Wound/Skin Impairment: Methods: Explain/Verbal Responses: State content correctly Electronic Signature(s) Signed: 01/24/2020 5:36:57 PM By: Levan Hurst RN, BSN Entered By: Levan Hurst on 01/24/2020 09:06:46 -------------------------------------------------------------------------------- Wound Assessment Details Patient Name: Date of Service: [name], [name] 01/24/2020 8:15 A M Medical Record Number: [id number] Patient Account Number: [id number] Date of Birth/Sex: Treating RN: 09-28-1960 (59 y.o. [name] Primary Care Rhoda Waldvogel: Karle Plumber Other Clinician: Referring Tallyn Holroyd: Treating Glory Graefe/Extender: Nyra Market in Treatment: 1 Wound Status Wound Number: 4 Primary Lymphedema Etiology: Wound Location: Left, Anterior Ankle Wound Open Wounding Event: Gradually Appeared Status: Date Acquired: 01/06/2020 Comorbid Lymphedema, Arrhythmia, Congestive Heart Failure, Weeks Of Treatment: 1 History: Hypertension, Type II Diabetes, End Stage Renal Disease, Gout Clustered Wound: No Wound Measurements Length: (cm) Width: (cm) Depth: (cm) Area: (cm) Volume: (cm) 0 % Reduction in Area: 100% 0 % Reduction in Volume: 100% 0  Epithelialization: Large (67-100%) 0 Tunneling: No 0 Undermining: No Wound Description Classification: Full Thickness Without Exposed Support Structures Wound Margin: Distinct, outline attached Exudate Amount: None Present Foul Odor After Cleansing: No Slough/Fibrino No Wound Bed Granulation Amount: None Present (0%) Exposed Structure Necrotic Amount: None Present (0%) Fascia Exposed: No Fat Layer (Subcutaneous Tissue) Exposed: No Tendon Exposed: No Muscle Exposed: No Joint Exposed: No Bone Exposed: No Electronic Signature(s) Signed: 01/24/2020 5:22:44 PM By: Kela Millin Entered By: Kela Millin on 01/24/2020 08:54:34 -------------------------------------------------------------------------------- Vitals Details Patient Name: Date of Service: [name], [name] 01/24/2020 8:15 A M Medical Record Number: [id number] Patient Account Number: [id number] Date of Birth/Sex: Treating RN: [birth year] (59 y.o. [name] Primary Care Terea Neubauer: Karle Plumber Other Clinician: Referring Zyara Riling: Treating Atlas Crossland/Extender: Nyra Market in Treatment: 1 Vital Signs Time Taken: 08:50 Temperature (F): 98.3 Height (in): 70 Pulse (bpm): 87 Weight (lbs): 284 Respiratory Rate (breaths/min): 19 Body Mass Index (BMI): 40.7 Blood Pressure (mmHg): 136/86 Capillary Blood Glucose (mg/dl): 121 Reference Range: 80 - 120 mg / dl Notes patient stated his CBG was 121 this morning Electronic Signature(s) Signed: 01/24/2020 5:22:44 PM  By: Kela Millin Entered By: Kela Millin on 01/24/2020 08:53:37

## 2020-01-24 NOTE — Patient Instructions (Addendum)
I have started you on a low-dose of the medication called Cardura to see if it would help decrease the urinary frequency.  If it does not, please let me know so that I can refer you to a urologist.  Influenza Virus Vaccine injection (Fluarix) What is this medicine? INFLUENZA VIRUS VACCINE (in floo EN zuh VAHY ruhs vak SEEN) helps to reduce the risk of getting influenza also known as the flu. This medicine may be used for other purposes; ask your health care provider or pharmacist if you have questions. COMMON BRAND NAME(S): Fluarix, Fluzone What should I tell my health care provider before I take this medicine? They need to know if you have any of these conditions:  bleeding disorder like hemophilia  fever or infection  Guillain-Barre syndrome or other neurological problems  immune system problems  infection with the human immunodeficiency virus (HIV) or AIDS  low blood platelet counts  multiple sclerosis  an unusual or allergic reaction to influenza virus vaccine, eggs, chicken proteins, latex, gentamicin, other medicines, foods, dyes or preservatives  pregnant or trying to get pregnant  breast-feeding How should I use this medicine? This vaccine is for injection into a muscle. It is given by a health care professional. A copy of Vaccine Information Statements will be given before each vaccination. Read this sheet carefully each time. The sheet may change frequently. Talk to your pediatrician regarding the use of this medicine in children. Special care may be needed. Overdosage: If you think you have taken too much of this medicine contact a poison control center or emergency room at once. NOTE: This medicine is only for you. Do not share this medicine with others. What if I miss a dose? This does not apply. What may interact with this medicine?  chemotherapy or radiation therapy  medicines that lower your immune system like etanercept, anakinra, infliximab, and  adalimumab  medicines that treat or prevent blood clots like warfarin  phenytoin  steroid medicines like prednisone or cortisone  theophylline  vaccines This list may not describe all possible interactions. Give your health care provider a list of all the medicines, herbs, non-prescription drugs, or dietary supplements you use. Also tell them if you smoke, drink alcohol, or use illegal drugs. Some items may interact with your medicine. What should I watch for while using this medicine? Report any side effects that do not go away within 3 days to your doctor or health care professional. Call your health care provider if any unusual symptoms occur within 6 weeks of receiving this vaccine. You may still catch the flu, but the illness is not usually as bad. You cannot get the flu from the vaccine. The vaccine will not protect against colds or other illnesses that may cause fever. The vaccine is needed every year. What side effects may I notice from receiving this medicine? Side effects that you should report to your doctor or health care professional as soon as possible:  allergic reactions like skin rash, itching or hives, swelling of the face, lips, or tongue Side effects that usually do not require medical attention (report to your doctor or health care professional if they continue or are bothersome):  fever  headache  muscle aches and pains  pain, tenderness, redness, or swelling at site where injected  weak or tired This list may not describe all possible side effects. Call your doctor for medical advice about side effects. You may report side effects to FDA at 1-800-FDA-1088. Where should I keep my  medicine? This vaccine is only given in a clinic, pharmacy, doctor's office, or other health care setting and will not be stored at home. NOTE: This sheet is a summary. It may not cover all possible information. If you have questions about this medicine, talk to your doctor, pharmacist,  or health care provider.  2020 [employer]/Gold Standard (2007-12-20 09:30:40)

## 2020-01-24 NOTE — Progress Notes (Signed)
[name] ([id number]) Visit Report for 01/24/2020 HPI Details Patient Name: Date of Service: [name] MSGreggory [name] MES D. 01/24/2020 8:15 A M Medical Record Number: [id number] Patient Account Number: [id number] Date of Birth/Sex: Treating RN: [birth year] (59 y.o. [name] Primary Care Provider: Karle [name] Other Clinician: Referring Provider: Treating Provider/Extender: [name] in Treatment: 1 History of Present Illness Location: Patient presents with a wound to the left buttock area. Quality: Patient reports experiencing a dull pain to affected area(s). Severity: Mildly severe wound with no evidence of infection Duration: Patient has had the wound for   2 weeks prior to presenting for treatment Timing: Pain in wound is Intermittent (comes and goes) HPI Description: ADMISSION 11/28/2018 This is a pleasant 59 year old man who has chronic lymphedema since about 2010. The exact etiology of this is not really clear indeed if this is been worked up it is not really clear. In any case he also has type 2 diabetes. He was in hospital recently for a small brainstem CVA discharged in late May with bilateral Unna wraps on his legs. Is not clear that he had wounds in the hospital. Nevertheless he comes in today with the same Unna boots on his legs. He has compression stockings at home but they are old and he wanted to get remeasured. He went to [facility] however they said that they would need up-to-date measurements which I believe we can provide today. He also has compression pumps which he uses intermittently at home. I am not really sure who ordered these. He does not have a wound history but states that when he went in the hospital his legs were so big that his stockings did not fit at home Past medical history includes type 2 diabetes, lymphedema, bursitis in the left knee on 10/23/2018, hyper lipidemia, recent brainstem CVA leaving him with some  neurologic disability but much better, Febres disease, pacemaker and hypertension. The patient had venous reflux studies in June 2019. There was abnormal reflux times bilaterally in the proximal veins but it does not look like they were able to obtain reflux in his bilateral lower legs. I am not sure what the issue was here may have been too much edema to visualize the veins 09/04/19; READMISSION this is a now 59 year old man who is been in this clinic at least once before most recently in June of last year. He has lymphedema the exact etiology of which is unclear. He has been fairly consistent and saying this goes back about 12 years. Almost totally confined to below his knees he wears 20/30 below- knee stockings. He also has compression pumps at home that he is mostly compliant with. Apparently about 2 weeks ago his legs began to swell. He left his stockings on for a prolonged period of time, didn't take them off in the eventually rubbed wounds in his dorsal feet just distal to the crease of his ankles. He has been unable to get his stockings on since then and has not been using his compression pumps.it is not clear what is been using to the wounds perhaps Bactroban. His past medical history is reviewed and includes type 2 diabetes with a recent hemoglobin A1c of 9, hypertension, Fabry's disease, hyperlipidemia. Brainstem CVA diastolic heart failure, stage III chronic renal failure and lymphedema as noted. his legs are much too large to do ABIs on. Previously we have not felt he had an arterial issue 4/6; the areas in the crease of his ankles are  better on both sides. Tissue looks healthy we have been using silver alginate under compression he is using his compression pumps 4/13; the wounds in the creases of his ankles on both sides which were issues with related to his inner layer stockings of his juxta lites are both closed. He has lymphedema and he has external compression pumps. I think he is  generally very compliant. He has significant lymphedema with pantaloon deformities around his ankle READMISSION 01/17/2020- Patient returns to clinic with a new open area on his left anterior ankle in the crease on the dorsum, since about 2 weeks, patient has been using his compression devices sometimes decrease due to the shape of his leg with a tendency to pool around his ankle in a pantaloon deformity. Patient denies any fevers chills or shakes, denies any malodorous discharge. He has been compliant with his lymphedema pumps which he uses once a day he is on Lasix 1 tablet a unknown dose he does not have sleep apnea Patient has been using Neosporin with gauze to this area and continuing to apply compression as best as possible ABIs were not obtained in the past due to the significant lymphedema 8/19; patient admitted to the clinic last week with a wound on the left anterior dorsal ankle crease. This is in the setting of somebody with chronic lymphedema and a pantaloon deformity in this area. He has juxta lite stockings and external compression pumps that he uses once a day for 2 hours. It turns out his juxta lite stockings are about 30 months old Engineer, maintenance) Signed: 01/24/2020 5:57:44 PM By: [name] MD Entered By: [name] on 01/24/2020 09:31:02 -------------------------------------------------------------------------------- Physical Exam Details Patient Name: Date of Service: [name] Bishop MS, [name] 01/24/2020 8:15 A M Medical Record Number: [id number] Patient Account Number: [id number] Date of Birth/Sex: Treating RN: 1961/01/18 (59 y.o. [name] Primary Care Provider: Karle [name] Other Clinician: Referring Provider: Treating Provider/Extender: [name] in Treatment: 1 Constitutional Sitting or standing Blood Pressure is within target range for patient.. Pulse regular and within target range for patient.[name] Kitchen Respirations  regular, non-labored and within target range.. Temperature is normal and within the target range for the patient.[name] Kitchen Appears in no distress. Cardiovascular Pedal pulses are palpable. Notes Wound exam; the patient had a wound last week on the dorsal part of his ankle and the left ankle crease this is totally epithelialized and healed there is no evidence of infection. He has lymphedema in both legs. Electronic Signature(s) Signed: 01/24/2020 5:57:44 PM By: [name] MD Entered By: [name] on 01/24/2020 09:32:01 -------------------------------------------------------------------------------- Physician Orders Details Patient Name: Date of Service: [name] Bishop MS, [name] 01/24/2020 8:15 A M Medical Record Number: [id number] Patient Account Number: [id number] Date of Birth/Sex: Treating RN: 09-Apr-1961 (59 y.o. [name] Primary Care Provider: Karle [name] Other Clinician: Referring Provider: Treating Provider/Extender: [name] in Treatment: 1 Verbal / Phone Orders: No Diagnosis Coding ICD-10 Coding Code Description [id number] Non-pressure chronic ulcer of left ankle limited to breakdown of skin I89.0 Lymphedema, not elsewhere classified P38.25 Chronic diastolic (congestive) heart failure E11.21 Type 2 diabetes mellitus with diabetic nephropathy Discharge From [facility] Services Discharge from [place] Skin Barriers/Peri-Wound Care Moisturizing lotion - both legs daily Edema Control Elevate legs to the level of the heart or above for 30 minutes daily and/or when sitting, a frequency of: - throughout the day Exercise regularly Support Garment 30-40 mm/Hg pressure to: - Juxtalite to both legs  daily Segmental Compressive Device. - Lymphedema pumps 2 times a day for 1 hour each time Electronic Signature(s) Signed: 01/24/2020 5:36:57 PM By: Levan Hurst RN, BSN Signed: 01/24/2020 5:57:44 PM By: [name] MD Entered By: Levan Hurst on 01/24/2020 09:14:43 -------------------------------------------------------------------------------- Problem List Details Patient Name: Date of Service: [name] Bishop MS, [name] 01/24/2020 8:15 A M Medical Record Number: [id number] Patient Account Number: [id number] Date of Birth/Sex: Treating RN: [birth year] (59 y.o. [name] Primary Care Provider: Karle [name] Other Clinician: Referring Provider: Treating Provider/Extender: [name] in Treatment: 1 Active Problems ICD-10 Encounter Code Description Active Date MDM Diagnosis L97.321 Non-pressure chronic ulcer of left ankle limited to breakdown of skin 01/17/2020 No Yes I89.0 Lymphedema, not elsewhere classified 01/17/2020 No Yes G99.24 Chronic diastolic (congestive) heart failure 01/17/2020 No Yes E11.21 Type 2 diabetes mellitus with diabetic nephropathy 01/17/2020 No Yes Inactive Problems Resolved Problems Electronic Signature(s) Signed: 01/24/2020 5:57:44 PM By: [name] MD Entered By: [name] on 01/24/2020 09:28:39 -------------------------------------------------------------------------------- Progress Note Details Patient Name: Date of Service: [name] Bishop MS, [name] 01/24/2020 8:15 A M Medical Record Number: [id number] Patient Account Number: [id number] Date of Birth/Sex: Treating RN: 26-Jul-1960 (59 y.o. [name] Primary Care Provider: Karle [name] Other Clinician: Referring Provider: Treating Provider/Extender: [name] in Treatment: 1 Subjective History of Present Illness (HPI) The following HPI elements were documented for the patient's wound: Location: Patient presents with a wound to the left buttock area. Quality: Patient reports experiencing a dull pain to affected area(s). Severity: Mildly severe wound with no evidence of infection Duration: Patient has had the wound for   2 weeks prior to presenting for  treatment Timing: Pain in wound is Intermittent (comes and goes) ADMISSION 11/28/2018 This is a pleasant 59 year old man who has chronic lymphedema since about 2010. The exact etiology of this is not really clear indeed if this is been worked up it is not really clear. In any case he also has type 2 diabetes. He was in hospital recently for a small brainstem CVA discharged in late May with bilateral Unna wraps on his legs. Is not clear that he had wounds in the hospital. Nevertheless he comes in today with the same Unna boots on his legs. He has compression stockings at home but they are old and he wanted to get remeasured. He went to [facility] however they said that they would need up-to-date measurements which I believe we can provide today. He also has compression pumps which he uses intermittently at home. I am not really sure who ordered these. He does not have a wound history but states that when he went in the hospital his legs were so big that his stockings did not fit at home Past medical history includes type 2 diabetes, lymphedema, bursitis in the left knee on 10/23/2018, hyper lipidemia, recent brainstem CVA leaving him with some neurologic disability but much better, Febres disease, pacemaker and hypertension. The patient had venous reflux studies in June 2019. There was abnormal reflux times bilaterally in the proximal veins but it does not look like they were able to obtain reflux in his bilateral lower legs. I am not sure what the issue was here may have been too much edema to visualize the veins 09/04/19; READMISSION this is a now 59 year old man who is been in this clinic at least once before most recently in June of last year. He has lymphedema the exact etiology of which is unclear. He  has been fairly consistent and saying this goes back about 12 years. Almost totally confined to below his knees he wears 20/30 below- knee stockings. He also has compression pumps at home that he  is mostly compliant with. Apparently about 2 weeks ago his legs began to swell. He left his stockings on for a prolonged period of time, didn't take them off in the eventually rubbed wounds in his dorsal feet just distal to the crease of his ankles. He has been unable to get his stockings on since then and has not been using his compression pumps.it is not clear what is been using to the wounds perhaps Bactroban. His past medical history is reviewed and includes type 2 diabetes with a recent hemoglobin A1c of 9, hypertension, Fabry's disease, hyperlipidemia. Brainstem CVA diastolic heart failure, stage III chronic renal failure and lymphedema as noted. his legs are much too large to do ABIs on. Previously we have not felt he had an arterial issue 4/6; the areas in the crease of his ankles are better on both sides. Tissue looks healthy we have been using silver alginate under compression he is using his compression pumps 4/13; the wounds in the creases of his ankles on both sides which were issues with related to his inner layer stockings of his juxta lites are both closed. He has lymphedema and he has external compression pumps. I think he is generally very compliant. He has significant lymphedema with pantaloon deformities around his ankle READMISSION 01/17/2020- Patient returns to clinic with a new open area on his left anterior ankle in the crease on the dorsum, since about 2 weeks, patient has been using his compression devices sometimes decrease due to the shape of his leg with a tendency to pool around his ankle in a pantaloon deformity. Patient denies any fevers chills or shakes, denies any malodorous discharge. He has been compliant with his lymphedema pumps which he uses once a day he is on Lasix 1 tablet a unknown dose he does not have sleep apnea Patient has been using Neosporin with gauze to this area and continuing to apply compression as best as possible ABIs were not obtained in the  past due to the significant lymphedema 8/19; patient admitted to the clinic last week with a wound on the left anterior dorsal ankle crease. This is in the setting of somebody with chronic lymphedema and a pantaloon deformity in this area. He has juxta lite stockings and external compression pumps that he uses once a day for 2 hours. It turns out his juxta lite stockings are about 98 months old Objective Constitutional Sitting or standing Blood Pressure is within target range for patient.. Pulse regular and within target range for patient.[name] Kitchen Respirations regular, non-labored and within target range.. Temperature is normal and within the target range for the patient.[name] Kitchen Appears in no distress. Vitals Time Taken: 8:50 AM, Height: 70 in, Weight: 284 lbs, BMI: 40.7, Temperature: 98.3 F, Pulse: 87 bpm, Respiratory Rate: 19 breaths/min, Blood Pressure: 136/86 mmHg, Capillary Blood Glucose: 121 mg/dl. General Notes: patient stated his CBG was 121 this morning Cardiovascular Pedal pulses are palpable. General Notes: Wound exam; the patient had a wound last week on the dorsal part of his ankle and the left ankle crease this is totally epithelialized and healed there is no evidence of infection. He has lymphedema in both legs. Integumentary (Hair, Skin) Wound #4 status is Open. Original cause of wound was Gradually Appeared. The wound is located on the Left,Anterior Ankle. The  wound measures 0cm length x 0cm width x 0cm depth; 0cm^2 area and 0cm^3 volume. There is no tunneling or undermining noted. There is a none present amount of drainage noted. The wound margin is distinct with the outline attached to the wound base. There is no granulation within the wound bed. There is no necrotic tissue within the wound bed. Assessment Active Problems ICD-10 Non-pressure chronic ulcer of left ankle limited to breakdown of skin Lymphedema, not elsewhere classified Chronic diastolic (congestive) heart failure Type  2 diabetes mellitus with diabetic nephropathy Plan Discharge From [facility] Services: Discharge from [place] Skin Barriers/Peri-Wound Care: Moisturizing lotion - both legs daily Edema Control: Elevate legs to the level of the heart or above for 30 minutes daily and/or when sitting, a frequency of: - throughout the day Exercise regularly Support Garment 30-40 mm/Hg pressure to: - Juxtalite to both legs daily Segmental Compressive Device. - Lymphedema pumps 2 times a day for 1 hour each time #1 the patient can be discharged from the clinic 2. We advised to consider buying new juxta lite stockings as his are really fairly old [19 months] 3. I have asked him to use his compression pumps 1 hour twice a day instead of 2 hours once a day 4. He will need to continue to pad this area of his ankle and he expressed understanding 5. He does not have an arterial issue his pedal pulses are palpable Electronic Signature(s) Signed: 01/24/2020 5:57:44 PM By: [name] MD Entered By: [name] on 01/24/2020 09:33:25 -------------------------------------------------------------------------------- SuperBill Details Patient Name: Date of Service: [name] Bishop MS, [name] 01/24/2020 Medical Record Number: [id number] Patient Account Number: [id number] Date of Birth/Sex: Treating RN: 22-Dec-1960 (59 y.o. [name] Primary Care Provider: Karle [name] Other Clinician: Referring Provider: Treating Provider/Extender: [name] in Treatment: 1 Diagnosis Coding ICD-10 Codes Code Description [phone] Non-pressure chronic ulcer of left ankle limited to breakdown of skin I89.0 Lymphedema, not elsewhere classified T55.73 Chronic diastolic (congestive) heart failure E11.21 Type 2 diabetes mellitus with diabetic nephropathy Facility Procedures CPT4 Code: [id number] Description: 99213 - WOUND CARE VISIT-LEV 3 EST PT Modifier: Quantity: 1 Physician Procedures : CPT4  Code Description Modifier [id number] - WC PHYS LEVEL 3 - EST PT ICD-10 Diagnosis Description V61.607 Non-pressure chronic ulcer of left ankle limited to breakdown of skin I89.0 Lymphedema, not elsewhere classified E11.21 Type 2 diabetes  mellitus with diabetic nephropathy Quantity: 1 Electronic Signature(s) Signed: 01/24/2020 5:57:44 PM By: [name] MD Entered By: [name] on 01/24/2020 09:33:43

## 2020-01-25 LAB — URINALYSIS
Bilirubin, UA: NEGATIVE
Glucose, UA: NEGATIVE
Ketones, UA: NEGATIVE
Leukocytes,UA: NEGATIVE
Nitrite, UA: NEGATIVE
RBC, UA: NEGATIVE
Specific Gravity, UA: 1.012 (ref 1.005–1.030)
Urobilinogen, Ur: 0.2 mg/dL (ref 0.2–1.0)
pH, UA: 5.5 (ref 5.0–7.5)

## 2020-01-25 LAB — PSA: Prostate Specific Ag, Serum: 1.6 ng/mL (ref 0.0–4.0)

## 2020-01-29 DIAGNOSIS — E7521 Fabry (-Anderson) disease: Secondary | ICD-10-CM | POA: Diagnosis not present

## 2020-02-12 DIAGNOSIS — E7521 Fabry (-Anderson) disease: Secondary | ICD-10-CM | POA: Diagnosis not present

## 2020-02-13 DIAGNOSIS — E7521 Fabry (-Anderson) disease: Secondary | ICD-10-CM | POA: Diagnosis not present

## 2020-02-13 DIAGNOSIS — Z794 Long term (current) use of insulin: Secondary | ICD-10-CM | POA: Diagnosis not present

## 2020-02-13 DIAGNOSIS — E261 Secondary hyperaldosteronism: Secondary | ICD-10-CM | POA: Diagnosis not present

## 2020-02-13 DIAGNOSIS — E1159 Type 2 diabetes mellitus with other circulatory complications: Secondary | ICD-10-CM | POA: Diagnosis not present

## 2020-02-13 DIAGNOSIS — L97909 Non-pressure chronic ulcer of unspecified part of unspecified lower leg with unspecified severity: Secondary | ICD-10-CM | POA: Diagnosis not present

## 2020-02-13 DIAGNOSIS — Z008 Encounter for other general examination: Secondary | ICD-10-CM | POA: Diagnosis not present

## 2020-02-13 DIAGNOSIS — I495 Sick sinus syndrome: Secondary | ICD-10-CM | POA: Diagnosis not present

## 2020-02-13 DIAGNOSIS — I110 Hypertensive heart disease with heart failure: Secondary | ICD-10-CM | POA: Diagnosis not present

## 2020-02-13 DIAGNOSIS — Z6841 Body Mass Index (BMI) 40.0 and over, adult: Secondary | ICD-10-CM | POA: Diagnosis not present

## 2020-02-13 DIAGNOSIS — I509 Heart failure, unspecified: Secondary | ICD-10-CM | POA: Diagnosis not present

## 2020-02-16 ENCOUNTER — Encounter: Payer: Self-pay | Admitting: Internal Medicine

## 2020-02-20 ENCOUNTER — Encounter: Payer: Self-pay | Admitting: Podiatry

## 2020-02-20 ENCOUNTER — Ambulatory Visit: Payer: Medicare HMO | Admitting: Podiatry

## 2020-02-20 ENCOUNTER — Other Ambulatory Visit: Payer: Self-pay

## 2020-02-20 DIAGNOSIS — E669 Obesity, unspecified: Secondary | ICD-10-CM | POA: Diagnosis not present

## 2020-02-20 DIAGNOSIS — M79675 Pain in left toe(s): Secondary | ICD-10-CM

## 2020-02-20 DIAGNOSIS — E1169 Type 2 diabetes mellitus with other specified complication: Secondary | ICD-10-CM

## 2020-02-20 DIAGNOSIS — M79674 Pain in right toe(s): Secondary | ICD-10-CM

## 2020-02-20 DIAGNOSIS — B351 Tinea unguium: Secondary | ICD-10-CM | POA: Diagnosis not present

## 2020-02-20 NOTE — Progress Notes (Signed)
This patient returns to my office for at risk foot care.  This patient requires this care by a professional since this patient will be at risk due to having diabetes lymphedema and coagulation defect due to taking plavix.  This patient is unable to cut nails himself since the patient cannot reach his nails.These nails are painful walking and wearing shoes.  This patient presents for at risk foot care today.  General Appearance  Alert, conversant and in no acute stress.  Vascular  Dorsalis pedis and posterior tibial  pulses are not  palpable  bilaterally due to severe swelling from lymphedema..  Capillary return is within normal limits  bilaterally. Temperature is within normal limits  bilaterally.  Neurologic  Senn-Weinstein monofilament wire test within normal limits  bilaterally. Muscle power within normal limits bilaterally.  Nails Thick disfigured discolored nails with subungual debris  Hallux nails  B/l. No evidence of bacterial infection or drainage bilaterally.  Orthopedic  No limitations of motion  feet .  No crepitus or effusions noted.  No bony pathology or digital deformities noted.  Skin  normotropic skin with no porokeratosis noted bilaterally.  No signs of infections or ulcers noted.     Onychomycosis  Pain in right toes  Pain in left toes  Consent was obtained for treatment procedures.   Mechanical debridement of nails 1-5  bilaterally performed with a nail nipper.  Filed with dremel without incident.    Return office visit  3 months                   Told patient to return for periodic foot care and evaluation due to potential at risk complications.   Gardiner Barefoot DPM

## 2020-02-26 DIAGNOSIS — E7521 Fabry (-Anderson) disease: Secondary | ICD-10-CM | POA: Diagnosis not present

## 2020-03-05 ENCOUNTER — Other Ambulatory Visit: Payer: Self-pay | Admitting: Internal Medicine

## 2020-03-05 DIAGNOSIS — I10 Essential (primary) hypertension: Secondary | ICD-10-CM

## 2020-03-05 DIAGNOSIS — N1831 Chronic kidney disease, stage 3a: Secondary | ICD-10-CM | POA: Diagnosis not present

## 2020-03-05 DIAGNOSIS — M109 Gout, unspecified: Secondary | ICD-10-CM | POA: Diagnosis not present

## 2020-03-05 DIAGNOSIS — G463 Brain stem stroke syndrome: Secondary | ICD-10-CM

## 2020-03-05 DIAGNOSIS — E118 Type 2 diabetes mellitus with unspecified complications: Secondary | ICD-10-CM | POA: Diagnosis not present

## 2020-03-05 DIAGNOSIS — I890 Lymphedema, not elsewhere classified: Secondary | ICD-10-CM | POA: Diagnosis not present

## 2020-03-05 DIAGNOSIS — I441 Atrioventricular block, second degree: Secondary | ICD-10-CM | POA: Diagnosis not present

## 2020-03-05 DIAGNOSIS — I639 Cerebral infarction, unspecified: Secondary | ICD-10-CM | POA: Diagnosis not present

## 2020-03-05 DIAGNOSIS — E7521 Fabry (-Anderson) disease: Secondary | ICD-10-CM | POA: Diagnosis not present

## 2020-03-05 DIAGNOSIS — I129 Hypertensive chronic kidney disease with stage 1 through stage 4 chronic kidney disease, or unspecified chronic kidney disease: Secondary | ICD-10-CM | POA: Diagnosis not present

## 2020-03-05 NOTE — Telephone Encounter (Signed)
Requested medications are due for refill today?  Yes  Requested medications are on active medication list? Yes  Last Refill:   06/18/2019  # 90 with 2 refills   Future visit scheduled?  Yes  Notes to Clinic:  Medication failed Rx refill protocol due to no labs within the past 180 days.

## 2020-03-05 NOTE — Telephone Encounter (Signed)
Requested Prescriptions  Pending Prescriptions Disp Refills   furosemide (LASIX) 20 MG tablet [Pharmacy Med Name: FUROSEMIDE 20 MG TABLET] 90 tablet 2    Sig: TAKE 1 TABLET BY MOUTH EVERY DAY     Cardiovascular:  Diuretics - Loop Failed - 03/05/2020  1:21 AM      Failed - Cr in normal range and within 360 days    Creat  Date Value Ref Range Status  11/07/2014 1.28 0.50 - 1.35 mg/dL Final   Creatinine, Ser  Date Value Ref Range Status  08/23/2019 2.02 (H) 0.76 - 1.27 mg/dL Final   Creatinine, Urine  Date Value Ref Range Status  08/30/2014 72.9 mg/dL Final    Comment:    No reference range established.         Passed - K in normal range and within 360 days    Potassium  Date Value Ref Range Status  08/23/2019 4.6 3.5 - 5.2 mmol/L Final         Passed - Ca in normal range and within 360 days    Calcium  Date Value Ref Range Status  08/23/2019 9.8 8.7 - 10.2 mg/dL Final   Calcium, Ion  Date Value Ref Range Status  05/22/2019 1.16 1.15 - 1.40 mmol/L Final         Passed - Na in normal range and within 360 days    Sodium  Date Value Ref Range Status  08/23/2019 143 134 - 144 mmol/L Final         Passed - Last BP in normal range    BP Readings from Last 1 Encounters:  01/24/20 120/82         Passed - Valid encounter within last 6 months    Recent Outpatient Visits          1 month ago Type 2 diabetes mellitus with other circulatory complication, with long-term current use of insulin ([place])   [place], Neoma Laming B, MD   2 months ago Type 2 diabetes mellitus with other circulatory complication, with long-term current use of insulin ([place])   [place], [place] J, NP   4 months ago Type 2 diabetes mellitus with other circulatory complication, with long-term current use of insulin ([place])   [place] Karle Plumber B, MD   6 months ago Ulcers of both lower legs  [facility]   [place] Karle Plumber B, MD   7 months ago Type 2 diabetes mellitus with other circulatory complication, with long-term current use of insulin [facility])   [place], Deborah B, MD      Future Appointments            In 2 months Ladell Pier, MD [place]            clopidogrel (PLAVIX) 75 MG tablet [Pharmacy Med Name: CLOPIDOGREL 75 MG TABLET] 90 tablet 2    Sig: TAKE 1 TABLET BY MOUTH EVERY DAY     Hematology: Antiplatelets - clopidogrel Failed - 03/05/2020  1:21 AM      Failed - Evaluate AST, ALT within 2 months of therapy initiation.      Failed - AST in normal range and within 360 days    AST  Date Value Ref Range Status  08/23/2019 41 (H) 0 - 40 IU/L Final  Failed - HCT in normal range and within 180 days    Hematocrit  Date Value Ref Range Status  08/23/2019 44.2 37.5 - 51.0 % Final         Failed - HGB in normal range and within 180 days    Hemoglobin  Date Value Ref Range Status  08/23/2019 14.7 13.0 - 17.7 g/dL Final   Total hemoglobin  Date Value Ref Range Status  07/26/2013 14.2 13.5 - 18.0 g/dL Final         Failed - PLT in normal range and within 180 days    Platelets  Date Value Ref Range Status  05/22/2019 193 150 - 400 K/uL Final  02/13/2019 221 150 - 450 x10E3/uL Final         Passed - ALT in normal range and within 360 days    ALT  Date Value Ref Range Status  08/23/2019 43 0 - 44 IU/L Final         Passed - Valid encounter within last 6 months    Recent Outpatient Visits          1 month ago Type 2 diabetes mellitus with other circulatory complication, with long-term current use of insulin ([place])   [place], Neoma Laming B, MD   2 months ago Type 2 diabetes mellitus with other circulatory complication, with long-term current use of insulin ([place])   [place], [place] J, NP   4 months ago Type 2 diabetes mellitus with other circulatory complication, with long-term current use of insulin ([place])   [place] Karle Plumber B, MD   6 months ago Ulcers of both lower legs [facility])   [place] Karle Plumber B, MD   7 months ago Type 2 diabetes mellitus with other circulatory complication, with long-term current use of insulin [facility])   [place], Deborah B, MD      Future Appointments            In 2 months Wynetta Emery Dalbert Batman, MD [place]

## 2020-03-06 ENCOUNTER — Encounter: Payer: Self-pay | Admitting: Adult Health

## 2020-03-06 ENCOUNTER — Ambulatory Visit (INDEPENDENT_AMBULATORY_CARE_PROVIDER_SITE_OTHER): Payer: Medicare HMO | Admitting: Adult Health

## 2020-03-06 ENCOUNTER — Other Ambulatory Visit: Payer: Self-pay

## 2020-03-06 VITALS — BP 134/83 | HR 73 | Ht 70.0 in | Wt 282.0 lb

## 2020-03-06 DIAGNOSIS — G463 Brain stem stroke syndrome: Secondary | ICD-10-CM

## 2020-03-06 DIAGNOSIS — I10 Essential (primary) hypertension: Secondary | ICD-10-CM

## 2020-03-06 DIAGNOSIS — E669 Obesity, unspecified: Secondary | ICD-10-CM

## 2020-03-06 DIAGNOSIS — E1169 Type 2 diabetes mellitus with other specified complication: Secondary | ICD-10-CM | POA: Diagnosis not present

## 2020-03-06 DIAGNOSIS — E785 Hyperlipidemia, unspecified: Secondary | ICD-10-CM

## 2020-03-06 NOTE — Patient Instructions (Addendum)
Continue clopidogrel 75 mg daily  and atorvastatin 80 mg daily for secondary stroke prevention  Continue to follow up with PCP regarding cholesterol, blood pressure and diabetes management  Maintain strict control of hypertension with blood pressure goal below 130/90, diabetes with hemoglobin A1c goal below 7.0% and cholesterol with LDL cholesterol (bad cholesterol) goal below 70 mg/dL.      Overall stable from stroke standpoint therefore no follow-up needed at this time Continue to follow with Dr. Krista Blue regarding Fabry's disease -schedule follow-up with Dr. Krista Blue at next available visit      Thank you for coming to see [place] at [facility]. I hope we have been able to provide you high quality care today.  You may receive a patient satisfaction survey over the next few weeks. We would appreciate your feedback and comments so that we may continue to improve ourselves and the health of our patients.

## 2020-03-06 NOTE — Progress Notes (Signed)
[facility] [address] [place]. [place] [address] [phone]       OFFICE FOLLOW UP NOTE  [name] Date of Birth:  11-Jan-1961 Medical Record Number:  [id number]   Reason for visit: Stroke follow-up   CHIEF COMPLAINT:  Chief Complaint  Patient presents with  . Follow-up    rm 9  . Cerebrovascular Accident    Pt said he still is off balance but no new sx.    HPI:  Today, 03/06/2020, [name] returns for 24-month stroke follow-up.  Stable from stroke standpoint without new or reoccurring stroke/TIA symptoms.  Continues to experience imbalance which has been stable without worsening. He questions possible improvement vs adjustment/compensating as ambulation and activity has been "easier".  He continues to use cane without any recent falls. Remains on Plavix and atorvastatin 80 mg daily for secondary stroke prevention without side effects.  Blood pressure today 134/83.  Glucose levels monitored at home and has been stable per patient. No concerns at this time.      History provided for reference purposes only Update 09/03/2019 JM: [name] is a 59 year old male who is being seen today, 09/03/2019, for stroke follow-up.  During prior visit, concern of decreased vision, worsening imbalance and vertigo therefore CT head obtained which did not show any acute abnormalities.  Unable to obtain MRI due to pacemaker.  He continues to work with PT with ongoing improvement of balance.  He denies residual dizziness or vertigo symptoms or worsening visual impairment.  Continues to ambulate with a cane without any recent falls.  Continues on Plavix and atorvastatin for secondary stroke prevention without side effects.  Blood pressure today 124/72.  Glucose levels stable.  Continues to follow with PCP regularly for chronic disease management.  Continues to follow with Dr. Krista Blue in this office for history of Fabry's disease.  Currently being followed by wound clinic for  bilateral lower extremity wounds with increased lymphedema.  No further concerns at this time.  Update 06/04/2019 JM: [name] is a 59 year old who is being seen today for stroke follow up as well as request by opthomology due to worsening diplopia.  Patient states he had acute onset of worsening double vision, imbalance and vertigo on 04/06/2019.  He was previously recovering well slowly returning back to prior activities.  He was evaluated with ophthalmology and concern for possible new stroke.  He unfortunately did have a fall on Christmas Eve when he went to stand up from a chair but went straight forward falling onto his face.  He reports he had a sensation as though he was being pushed down or pulled towards his left side.  He denies any injury, headache or worsening/new neurological symptoms.  He was advised by his PCP to be evaluated in the ER immediately but this had not been done for unknown reasons.  He does report improvement of vision since that time but has concerns regarding continued imbalance and vertigo.  He continues to follow with Dr. Krista Blue in his office for history of Fabrys disease receiving Fabrazyme infusions twice weekly.  He plans on undergoing CT head tomorrow to rule out possible new or worsening stroke.  He denies any increased stress, infections, illness, medication changes or trauma that occurred prior to worsening of prior symptoms.  Blood pressure today 122/74.  He has continued on Plavix and atorvastatin for secondary stroke prevention without side effects.  Glucose levels have been slightly elevated over the past 2 weeks.  He continues to follow PCP for HTN, HLD and DM.  No further concerns at this time.  Update 03/07/2019 JM: [name] is being seen today for stroke follow-up.  He has been doing well form a stroke standpoint with only intermittent diplopia which typically worsens with bright light.  Continues to use cane for occasional fatigue/"winded" with prolonged  ambulation but this has been stable without worsening. He continues to do HEP at home routinely. Continues on Plavix and atorvastatin for secondary stroke prevention without side effects.  Glucose levels have been stable.  Blood pressure today satisfactory at 122/83.  Denies new or worsening stroke/TIA symptoms. He continues to receive Fabrazyme infusions for Fabry disease and follows with Dr. Krista Blue routinely.   Virtual visit 12/04/2018 JM: Residual deficits of mild gait difficulties, left cheek numbness sensation and blurred vision upon awakening but improved through out the day - therapies completed after 2 visits - he also felt as though this was not beneficial for him as he was having them do exercises he learned during admission which he continued to do on his own at home He lives independently where he has returned back to all prior ADLs - he has been retired since 2013; he has not returned to driving at this time due to diplopia - he has not followed with ophthalmology at this time Completed 3 weeks DAPT and continues on Plavix alone without side effects of bleeding or bruising Continues on atorvastatin 80 mg daily without side effects myalgias Blood pressure - plans on obtaining his own cuff to monitor at home  He has resumed Fabrazyme on 11/17/2018 No further concerns at this time  Stroke admission 10/19/2018: [name] is a 59 y.o. male with history of diabetes, second-degree Mobitz with AV block requiring a pacemaker which is not MRI compatible, lymphedema in the lower extremities, HOCM, Fabry'sdisease, diastolic heart failure, chronic kidney disease, heart failure, and Bell's palsy  who presented on 10/19/2018 with headache, left facial paresthesias and pain behind his eyes.  Stroke work-up likely brainstem lacunar infarct secondary to small vessel disease too small to be seen on imaging could be related to Fabry's disease.  CT head did show old left pontine lacune which was new finding  since prior imaging in 2012.  TCD showed diffuse intracranial atherosclerosis.  Carotid Doppler and 2D echo unremarkable.  HIV negative.  UDS negative.  LDL 161 and A1c 7.4.  Recommended DAPT for 3 weeks and Plavix alone.  He continues to follow with Dr. Krista Blue at [facility] for ongoing management of Fabry's disease and recommended holding Fabrazyme in setting of acute illness and to resume 2 weeks prior nephrology input.  HTN stable.  Increase atorvastatin dose from 20 mg to 80 mg for HLD management.  Ongoing follow-up with PCP for DM management.  Other stroke risk factors include EtOH use, morbid obesity, family history of stroke, chronic diastolic CHF and hypertrophic obstructive cardiomyopathy with EF of 55 to 60%.  He was also found to have staph UTI and was treated with ceftriaxone.  Residual deficit of ataxia with diplopia and balance deficits and therefore discharged to CIR for ongoing therapy.  He was discharged home on 11/02/2018.       ROS:   14 system review of systems performed and negative with exception of imbalance   PMH:  Past Medical History:  Diagnosis Date  . Bell's palsy   . Bifascicular block   . Chronic bronchitis ([place])    "seasonal; get it q  yr"  . Chronic diastolic CHF (congestive heart failure) ([place]) 2010  . Chronic kidney disease (CKD), stage II (mild)    stage II to III/notes 07/23/2014  . Diastolic heart failure secondary to hypertrophic cardiomyopathy ([place]) CARDIOLOGIST-- DR Daneen Schick  . Dyspnea    increased exertion   . Edema 07/2013  . 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 Gout, arthritis 2014   bil feet. right worse  . History of cellulitis of skin with lymphangitis LEFT LEG  . HOCM (hypertrophic obstructive cardiomyopathy) ([place])    a. Echo 11/16: Severe LVH, EF 55-60%, abnormal GLS consistent with HOCM, no SAM, mild LAE  . Hypertension 20 years  . Lymphedema of lower extremity LEFT  >  RIGHT   "using ankle high socks  at home; pump doesn't work for me" (07/23/2014)  . Pacemaker    02/12/11  . Pneumonia 08/2011  . Seasonal asthma NO INHALERS   30 years  . Second degree Mobitz II AV block    with syncope, s/p PPM  . Short of breath on exertion   . Type II diabetes mellitus ([place]) 2012   INSULIN DEPENDENT    PSH:  Past Surgical History:  Procedure Laterality Date  . CARDIAC CATHETERIZATION  03-12-2011  DR Daneen Schick   HYPERTROPHIC CARDIOMYOPATHY WITH LV CAVITY  APPEARANCE CONSISTANT WITH SIGNIFICANT APICAL HYPERTROPHY/ NORMAL LVSF / EF 55%/ EVIDENCE OF DIASTOLIC DYSFUNCTION WITH EDP OF 23-10mmHg AFTER A-WAVE/ NORMAL CORONARY ARTERIES  . EVALUATION UNDER ANESTHESIA WITH FISTULECTOMY N/A 06/23/2015   Procedure: EXAM UNDER ANESTHESIA WITH POSSIBLE FISTULOTOMY;  Surgeon: Judeth Horn, MD;  Location: [facility];  Service: General;  Laterality: N/A;  . HERNIA REPAIR  [id number]   Umbilical Hernia Repair  . INCISION AND DRAINAGE PERIRECTAL ABSCESS Left 07/29/2014   Procedure: IRRIGATION AND DEBRIDEMENT PERIRECTAL ABSCESS;  Surgeon: Doreen Salvage, MD;  Location: [facility];  Service: General;  Laterality: Left;  Prone position  . INSERT / REPLACE / REMOVE PACEMAKER  02/12/2011   SJM implanted by Dr Rayann Heman for Mobitz II second degree AV block and syncope  . IRRIGATION AND DEBRIDEMENT ABSCESS N/A 07/27/2013   Procedure: IRRIGATION AND DEBRIDEMENT OF SKIN, SOFT TISSUE AND MUSCLES OF UPPER BACK (11X19X4cm) WITH 10 BLADE AND PULSATILE LAVAGE ;  Surgeon: Gayland Curry, MD;  Location: [facility];  Service: General;  Laterality: N/A;  . PPM GENERATOR CHANGEOUT N/A 05/22/2019   Procedure: PPM GENERATOR CHANGEOUT;  Surgeon: Thompson Grayer, MD;  Location: [facility];  Service: Cardiovascular;  Laterality: N/A;  . UMBILICAL HERNIA REPAIR  03/22/2012   Procedure: HERNIA REPAIR UMBILICAL ADULT;  Surgeon: Leighton Ruff, MD;  Location: [facility];  Service: General;  Laterality: N/A;  Umbilical Hernia Repair     Social History:  Social History    Socioeconomic History  . Marital status: Divorced    Spouse name: Not on file  . Number of children: Not on file  . Years of education: Not on file  . Highest education level: Not on file  Occupational History  . Not on file  Tobacco Use  . Smoking status: Never Smoker  . Smokeless tobacco: Never Used  Vaping Use  . Vaping Use: Never used  Substance and Sexual Activity  . Alcohol use: Yes    Comment: 07/23/2014 "might have a few drinks on holidays or at cookouts"  . Drug use: No  . Sexual activity: Yes  Other Topics Concern  . 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 Difficulty of Paying Living Expenses: Not on file  Food Insecurity:   . Worried About Charity fundraiser in the Last Year: Not on file  . Ran Out of Food in the Last Year: Not on file  Transportation Needs:   . Lack of Transportation (Medical): Not on file  . Lack of Transportation (Non-Medical): Not on file  Physical Activity:   . Days of Exercise per Week: Not on file  . Minutes of Exercise per Session: Not on file  Stress:   . Feeling of Stress : Not on file  Social Connections:   . Frequency of Communication with Friends and Family: Not on file  . Frequency of Social Gatherings with Friends and Family: Not on file  . Attends Religious Services: Not on file  . Active Member of Clubs or Organizations: Not on file  . Attends Archivist Meetings: Not on file  . Marital Status: Not on file  Intimate Partner Violence:   . Fear of Current or Ex-Partner: Not on file  . Emotionally Abused: Not on file  . Physically Abused: Not on file  . Sexually Abused: Not on file    Family History:  Family History  Problem Relation Age of Onset  . Stroke Mother   . Fabry's disease Mother   . Fabry's disease Brother   . Colon cancer Neg Hx     Medications:   Current Outpatient Medications on File Prior to  Visit  Medication Sig Dispense Refill  . Accu-Chek FastClix Lancets MISC 1 each by Other route 3 (three) times daily. 102 each 12  . acetaminophen (TYLENOL) 325 MG tablet Take 1-2 tablets (325-650 mg total) by mouth every 4 (four) hours as needed for mild pain.    . Agalsidase beta (FABRAZYME IV) Inject 115 mg into the vein every 14 (fourteen) days.     [name] Kitchen albuterol (PROAIR HFA) 108 (90 Base) MCG/ACT inhaler Inhale 1 puff into the lungs every 6 (six) hours as needed for wheezing or shortness of breath. 8.5 Inhaler 1  . allopurinol (ZYLOPRIM) 100 MG tablet TAKE 3 TABLETS BY MOUTH EVERY DAY 270 tablet 1  . amLODipine (NORVASC) 10 MG tablet TAKE 1 TABLET BY MOUTH EVERY DAY 90 tablet 2  . atorvastatin (LIPITOR) 80 MG tablet TAKE 1 TABLET (80 MG TOTAL) BY MOUTH DAILY AT 6 PM. 90 tablet 2  . clopidogrel (PLAVIX) 75 MG tablet TAKE 1 TABLET BY MOUTH EVERY DAY 90 tablet 2  . doxazosin (CARDURA) 2 MG tablet Take 0.5 tablets (1 mg total) by mouth daily. 15 tablet 2  . fexofenadine (ALLEGRA) 180 MG tablet Take 1 tablet (180 mg total) by mouth daily. 30 tablet 3  . fluticasone (FLONASE) 50 MCG/ACT nasal spray Place 1 spray into both nostrils daily. (Patient taking differently: Place 1 spray into both nostrils daily as needed for allergies. ) 16 g 1  . furosemide (LASIX) 20 MG tablet TAKE 1 TABLET BY MOUTH EVERY DAY 90 tablet 1  . glucose blood (ACCU-CHEK AVIVA PLUS) test strip 1 each by Other route 3 (three) times daily. Use as instructed 100 each 12  . insulin aspart protamine - aspart (NOVOLOG MIX 70/30 FLEXPEN) (70-30) 100 UNIT/ML FlexPen Inject 0.28 mLs (28 Units total) into the skin 2 (two) times daily. 15 mL 11  . Insulin Pen Needle (PX SHORTLENGTH PEN NEEDLES) 31G  X 8 MM MISC 1 each by Does not apply route 2 (two) times daily before a meal. 100 each 8  . losartan-hydrochlorothiazide (HYZAAR) 50-12.5 MG tablet Take 1 tablet by mouth daily. 90 tablet 0  . mupirocin ointment (BACTROBAN) 2 % Apply to affected  area daily with dressing change. 30 g 0  . OLIVE LEAF EXTRACT PO Take 1 capsule by mouth daily.     No current facility-administered medications on file prior to visit.    Allergies:   Allergies  Allergen Reactions  . Lisinopril Anaphylaxis  . Shellfish Allergy Hives and Swelling     Physical Exam  Today's Vitals   03/06/20 0921  BP: 134/83  Pulse: 73  Weight: 282 lb (127.9 kg)  Height: 5\' 10"  (1.778 m)   Body mass index is 40.46 kg/m.  General: Obese pleasant middle-aged African-American male, seated, in no evident distress Head: head normocephalic and atraumatic.   Neck: supple with no carotid or supraclavicular bruits Cardiovascular: regular rate and rhythm, no murmurs; bilateral lower extremity lymphedema Musculoskeletal: no deformity Skin:  no rash/petichiae Vascular:  Normal pulses all extremities   Neurologic Exam Mental Status: Awake and fully alert.   Fluent speech and language.  Oriented to place and time. Recent and remote memory intact. Attention span, concentration and fund of knowledge appropriate. Mood and affect appropriate.  Cranial Nerves: Pupils equal, briskly reactive to light. Extraocular movements but with 2-3 beat nystagmus bilaterally with left gaze. Visual fields full to confrontation.  Hearing intact. Facial sensation intact. Face, tongue, palate moves normally and symmetrically.  Motor: Normal bulk and tone. Normal strength in all tested extremity muscles. Sensory.: intact to touch , pinprick , position and vibratory sensation.  Coordination: Rapid alternating movements normal in all extremities. Finger-to-nose performed accurately bilaterally and difficulty performing heel-to-shin due to lymphedema. Gait and Station: Arises from chair without difficulty. Stance is normal. Gait demonstrates  broad-based gait with mild unsteadiness/imbalance with use of cane Reflexes: 1+ and symmetric. Toes downgoing.       ASSESSMENT/PLAN: [name] is  a 59 y.o. year old male here with likely brainstem lacunar infarct secondary to small vessel disease too small to be seen on imaging on 10/19/2018. Vascular risk factors include HTN, HLD, DM, Fabry's disease, diastolic heart failure, and prior history of stroke on imaging.      1. Likely brainstem stroke:  a. Residual imbalance which has been stable without worsening b. Continue clopidogrel 75 mg daily  and atorvastatin 80 mg for secondary stroke prevention.   c. Discussed secondary stroke prevention measures and importance of close PCP follow-up for aggressive stroke risk factor management 2. HTN: BP goal <130/90.  Stable.  Remains on Hyzaar, Lasix, Cardura and Norvasc per PCP 3. HLD: LDL goal <70.  Remains on atorvastatin 80 mg daily per PCP 4. DMII: A1c goal<7.0.  Stable per patient.  Remains on NovoLog per PCP 5. Fabry's disease: Advised to schedule follow-up visit with Dr. Krista Blue as prior visit 08/2018 and recommended 1 year follow-up   Overall stable from stroke standpoint and may follow-up on an as-needed basis    I spent 30 minutes of face-to-face and non-face-to-face time with patient.  This included previsit chart review, lab review, study review, order entry, electronic health record documentation, patient education regarding prior stroke, balance difficulty, secondary stroke prevention measures and importance of secondary stroke risk factor management and answered all questions to patient satisfaction   Frann Rider, AGNP-BC  [facility] [address]  [place], [place] [address]  Phone [phone] Fax [phone] Note: This document was prepared with digital dictation and possible smart phrase technology. Any transcriptional errors that result from this process are unintentional.

## 2020-03-06 NOTE — Progress Notes (Signed)
I agree with the above plan 

## 2020-03-11 DIAGNOSIS — E7521 Fabry (-Anderson) disease: Secondary | ICD-10-CM | POA: Diagnosis not present

## 2020-03-19 ENCOUNTER — Encounter: Payer: Self-pay | Admitting: Internal Medicine

## 2020-03-19 NOTE — Progress Notes (Signed)
Patient seen by Dr.[name] on 03/05/2020. Patient assessed to have Fabry disease and tolerating enzyme infusion well CKD stage IIIa Gout with no recent flares Hypertension and renal disease.  Blood pressure well controlled. CBC-H/H 14.9/47.7.  Platelet count 236 UA revealed 2+ protein Urine microalbumin of 629 LFTs normal BUN/creatinine 31/1.9.  GFR 43 PTH 109

## 2020-03-24 ENCOUNTER — Encounter: Payer: Self-pay | Admitting: Internal Medicine

## 2020-03-24 ENCOUNTER — Ambulatory Visit (INDEPENDENT_AMBULATORY_CARE_PROVIDER_SITE_OTHER): Payer: Medicare HMO

## 2020-03-24 DIAGNOSIS — I441 Atrioventricular block, second degree: Secondary | ICD-10-CM | POA: Diagnosis not present

## 2020-03-24 DIAGNOSIS — R3589 Other polyuria: Secondary | ICD-10-CM

## 2020-03-25 DIAGNOSIS — E7521 Fabry (-Anderson) disease: Secondary | ICD-10-CM | POA: Diagnosis not present

## 2020-03-26 ENCOUNTER — Encounter: Payer: Self-pay | Admitting: Internal Medicine

## 2020-03-28 ENCOUNTER — Other Ambulatory Visit: Payer: Self-pay | Admitting: Internal Medicine

## 2020-03-28 DIAGNOSIS — H1045 Other chronic allergic conjunctivitis: Secondary | ICD-10-CM | POA: Diagnosis not present

## 2020-03-28 DIAGNOSIS — Z794 Long term (current) use of insulin: Secondary | ICD-10-CM | POA: Diagnosis not present

## 2020-03-28 DIAGNOSIS — G463 Brain stem stroke syndrome: Secondary | ICD-10-CM

## 2020-03-28 DIAGNOSIS — E119 Type 2 diabetes mellitus without complications: Secondary | ICD-10-CM | POA: Diagnosis not present

## 2020-03-28 DIAGNOSIS — E7521 Fabry (-Anderson) disease: Secondary | ICD-10-CM | POA: Diagnosis not present

## 2020-03-28 DIAGNOSIS — H2513 Age-related nuclear cataract, bilateral: Secondary | ICD-10-CM | POA: Diagnosis not present

## 2020-03-28 DIAGNOSIS — H35033 Hypertensive retinopathy, bilateral: Secondary | ICD-10-CM | POA: Diagnosis not present

## 2020-03-28 LAB — CUP PACEART REMOTE DEVICE CHECK
Battery Remaining Longevity: 95 mo
Battery Remaining Percentage: 95.5 %
Battery Voltage: 3.01 V
Brady Statistic AP VP Percent: 64 %
Brady Statistic AP VS Percent: 1 %
Brady Statistic AS VP Percent: 35 %
Brady Statistic AS VS Percent: 1 %
Brady Statistic RA Percent Paced: 64 %
Brady Statistic RV Percent Paced: 99 %
Date Time Interrogation Session: 20211021110028
Implantable Lead Implant Date: 20120907
Implantable Lead Implant Date: 20120907
Implantable Lead Location: 753859
Implantable Lead Location: 753860
Implantable Pulse Generator Implant Date: 20201215
Lead Channel Impedance Value: 350 Ohm
Lead Channel Impedance Value: 390 Ohm
Lead Channel Pacing Threshold Amplitude: 0.75 V
Lead Channel Pacing Threshold Amplitude: 1.625 V
Lead Channel Pacing Threshold Pulse Width: 0.5 ms
Lead Channel Pacing Threshold Pulse Width: 0.8 ms
Lead Channel Sensing Intrinsic Amplitude: 5 mV
Lead Channel Setting Pacing Amplitude: 1.75 V
Lead Channel Setting Pacing Amplitude: 1.875
Lead Channel Setting Pacing Pulse Width: 0.8 ms
Lead Channel Setting Sensing Sensitivity: 4 mV
Pulse Gen Model: 2272
Pulse Gen Serial Number: 9187005

## 2020-03-31 NOTE — Progress Notes (Signed)
Remote pacemaker transmission.   

## 2020-04-03 ENCOUNTER — Encounter: Payer: Self-pay | Admitting: Internal Medicine

## 2020-04-08 DIAGNOSIS — E7521 Fabry (-Anderson) disease: Secondary | ICD-10-CM | POA: Diagnosis not present

## 2020-04-14 ENCOUNTER — Other Ambulatory Visit: Payer: Self-pay | Admitting: Internal Medicine

## 2020-04-14 DIAGNOSIS — I10 Essential (primary) hypertension: Secondary | ICD-10-CM

## 2020-04-14 NOTE — Telephone Encounter (Signed)
Requested Prescriptions  Pending Prescriptions Disp Refills  . losartan-hydrochlorothiazide (HYZAAR) 50-12.5 MG tablet [Pharmacy Med Name: LOSARTAN-HCTZ 50-12.5 MG TAB] 90 tablet 0    Sig: TAKE 1 TABLET BY MOUTH EVERY DAY     Cardiovascular: ARB + Diuretic Combos Failed - 04/14/2020  3:07 PM      Failed - K in normal range and within 180 days    Potassium  Date Value Ref Range Status  08/23/2019 4.6 3.5 - 5.2 mmol/L Final         Failed - Na in normal range and within 180 days    Sodium  Date Value Ref Range Status  08/23/2019 143 134 - 144 mmol/L Final         Failed - Cr in normal range and within 180 days    Creat  Date Value Ref Range Status  11/07/2014 1.28 0.50 - 1.35 mg/dL Final   Creatinine, Ser  Date Value Ref Range Status  08/23/2019 2.02 (H) 0.76 - 1.27 mg/dL Final   Creatinine, Urine  Date Value Ref Range Status  08/30/2014 72.9 mg/dL Final    Comment:    No reference range established.         Failed - Ca in normal range and within 180 days    Calcium  Date Value Ref Range Status  08/23/2019 9.8 8.7 - 10.2 mg/dL Final   Calcium, Ion  Date Value Ref Range Status  05/22/2019 1.16 1.15 - 1.40 mmol/L Final         Passed - Patient is not pregnant      Passed - Last BP in normal range    BP Readings from Last 1 Encounters:  03/06/20 134/83         Passed - Valid encounter within last 6 months    Recent Outpatient Visits          2 months ago Type 2 diabetes mellitus with other circulatory complication, with long-term current use of insulin ([place])   [place] Lizton, Neoma Laming B, MD   4 months ago Type 2 diabetes mellitus with other circulatory complication, with long-term current use of insulin ([place])   Kendall, [place] J, NP   6 months ago Type 2 diabetes mellitus with other circulatory complication, with long-term current use of insulin ([place])   [place] Karle Plumber B, MD   7 months ago Ulcers of both lower legs [facility])   [place] Karle Plumber B, MD   8 months ago Type 2 diabetes mellitus with other circulatory complication, with long-term current use of insulin [facility])   [place], MD      Future Appointments            In 1 month Wynetta Emery, Dalbert Batman, MD [place]

## 2020-04-20 ENCOUNTER — Encounter: Payer: Self-pay | Admitting: Internal Medicine

## 2020-04-21 ENCOUNTER — Other Ambulatory Visit: Payer: Self-pay | Admitting: Pharmacist

## 2020-04-21 DIAGNOSIS — I10 Essential (primary) hypertension: Secondary | ICD-10-CM

## 2020-04-21 MED ORDER — LOSARTAN POTASSIUM-HCTZ 50-12.5 MG PO TABS: 1.0000 | ORAL_TABLET | Freq: Every day | ORAL | 0 refills | Status: DC

## 2020-04-22 ENCOUNTER — Other Ambulatory Visit: Payer: Self-pay | Admitting: Internal Medicine

## 2020-04-22 DIAGNOSIS — R3589 Other polyuria: Secondary | ICD-10-CM

## 2020-04-22 DIAGNOSIS — E7521 Fabry (-Anderson) disease: Secondary | ICD-10-CM | POA: Diagnosis not present

## 2020-04-28 ENCOUNTER — Encounter: Payer: Self-pay | Admitting: Internal Medicine

## 2020-04-29 ENCOUNTER — Ambulatory Visit: Payer: Self-pay | Admitting: *Deleted

## 2020-04-29 NOTE — Telephone Encounter (Signed)
  Pt called in c/o left hip and left lower back pain that was present when he woke up Friday morning.   He is limping and had to start using his walker due to the pain in his left hip when ambulating.  I made him an appt with Freeman Caldron for 04/30/2020 at 9:30.   Reason for Disposition . Back pain (and neurologic deficit) . [1] MODERATE pain (e.g., interferes with normal activities, limping) AND [2] present > 3 days  Answer Assessment - Initial Assessment Questions 1. SYMPTOM: "What is the main symptom you are concerned about?" (e.g., weakness, numbness)     I'm having a constant pain since this last Friday when I woke up.   It's like arthritis 2. ONSET: "When did this start?" (minutes, hours, days; while sleeping)     Last Friday 3. LAST NORMAL: "When was the last time you were normal (no symptoms)?"     Left side to left thigh and lower back mostly around my hip.   4. PATTERN "Does this come and go, or has it been constant since it started?"  "Is it present now?"     I started Tylenol so it helps.  The pain is constant.    I had to get my walker to help me walk.  I can't put pressure on my left side. 5. CARDIAC SYMPTOMS: "Have you had any of the following symptoms: chest pain, difficulty breathing, palpitations?"     N/A 6. NEUROLOGIC SYMPTOMS: "Have you had any of the following symptoms: headache, dizziness, vision loss, double vision, changes in speech, unsteady on your feet?"     Yes unsteady.  Denies dizziness.   Had a stroke 2019.  This is new pain. 7. OTHER SYMPTOMS: "Do you have any other symptoms?"     Just inability to walk from time to time due to the pain.   No dizziness or vomiting. 8. PREGNANCY: "Is there any chance you are pregnant?" "When was your last menstrual period?"     N/A  Protocols used: NEUROLOGIC DEFICIT-A-AH, HIP PAIN-A-AH

## 2020-04-30 ENCOUNTER — Other Ambulatory Visit: Payer: Self-pay

## 2020-04-30 ENCOUNTER — Ambulatory Visit: Payer: Medicare HMO | Admitting: Physician Assistant

## 2020-04-30 ENCOUNTER — Ambulatory Visit: Payer: Medicare HMO | Attending: Physician Assistant | Admitting: Physician Assistant

## 2020-04-30 ENCOUNTER — Encounter: Payer: Self-pay | Admitting: Physician Assistant

## 2020-04-30 DIAGNOSIS — M5442 Lumbago with sciatica, left side: Secondary | ICD-10-CM | POA: Diagnosis not present

## 2020-04-30 DIAGNOSIS — M25552 Pain in left hip: Secondary | ICD-10-CM

## 2020-04-30 DIAGNOSIS — E1159 Type 2 diabetes mellitus with other circulatory complications: Secondary | ICD-10-CM

## 2020-04-30 DIAGNOSIS — Z794 Long term (current) use of insulin: Secondary | ICD-10-CM

## 2020-04-30 MED ORDER — NAPROXEN 500 MG PO TABS: 500.0000 mg | ORAL_TABLET | Freq: Two times a day (BID) | ORAL | 0 refills | Status: DC

## 2020-04-30 NOTE — Progress Notes (Signed)
Virtual Visit via Telephone Note  I connected with [name] on 04/30/20 at  9:30 AM EST by telephone and verified that I am speaking with the correct person using two identifiers.  Location: [name]  Provider: Freeman Caldron, PA-C   I discussed the limitations, risks, security and privacy concerns of performing an evaluation and management service by telephone and the availability of in person appointments. I also discussed with the patient that there may be a patient responsible charge related to this service. The patient expressed understanding and agreed to proceed.  PATIENT visit by telephone virtually in the context of Covid-19 pandemic. Patient location:  home My Location:  [place] office Persons on the call:  Freeman Caldron, PA-C     History of Present Illness:  awakened 5 days ago and L leg was very weak and it felt like his leg collapsed and he couldn't stand up for about 30 seconds.  It continued to feel weaker than normal but is improving.  He now describes it as a dull ache and tylenol is helping.  No problems with urination. Pain  Radiates back of L thigh and into small of back and L hip but does seem to be improving.   Blood sugars running 112 to 260.   he says the 260 was after eating a PBJ sandwich.     Observations/Objective:  NAD.  A&Ox3  Reviewed labs-PSA WNL 8/21 Poor GFR in 08/2019, CBC was stable, alk phos=126.     Assessment and Plan: 1. Left hip pain Improving but due to age and comorbidities- - DG Lumbar Spine Complete; Future - DG Hip Unilat W OR W/O Pelvis 2-3 Views Left; Future - naproxen (NAPROSYN) 500 MG tablet; Take 1 tablet (500 mg total) by mouth 2 (two) times daily with a meal. Prn pain  Dispense: 60 tablet; Refill: 0  2. Acute left-sided low back pain with left-sided sciatica Improving but due to age and comorbidities - DG Lumbar Spine Complete; Future - DG Hip Unilat W OR W/O Pelvis 2-3 Views Left; Future - naproxen  (NAPROSYN) 500 MG tablet; Take 1 tablet (500 mg total) by mouth 2 (two) times daily with a meal. Prn pain  Dispense: 60 tablet; Refill: 0  3. Type 2 diabetes mellitus with other circulatory complication, with long-term current use of insulin ([place]) Continue current regimen and see PCP as scheduled 12/14.  Eliminate sugar - Basic metabolic panel; Future  Follow Up Instructions: PCP as scheduled 12/14   I discussed the assessment and treatment plan with the patient. The patient was provided an opportunity to ask questions and all were answered. The patient agreed with the plan and demonstrated an understanding of the instructions.   The patient was advised to call back or seek an in-person evaluation if the symptoms worsen or if the condition fails to improve as anticipated.  I provided 16 minutes of non-face-to-face time during this encounter.   Freeman Caldron, PA-C  [name], male   DOB: 03/26/1961, 59 y.o.   MRN: [id number]

## 2020-05-02 ENCOUNTER — Ambulatory Visit (HOSPITAL_COMMUNITY)
Admission: RE | Admit: 2020-05-02 | Discharge: 2020-05-02 | Disposition: A | Discharge status: Home / Self Care | Payer: Medicare HMO | Source: Ambulatory Visit | Attending: Physician Assistant | Admitting: Physician Assistant

## 2020-05-02 ENCOUNTER — Other Ambulatory Visit: Payer: Self-pay

## 2020-05-02 DIAGNOSIS — M25552 Pain in left hip: Secondary | ICD-10-CM | POA: Insufficient documentation

## 2020-05-02 DIAGNOSIS — M1612 Unilateral primary osteoarthritis, left hip: Secondary | ICD-10-CM | POA: Diagnosis not present

## 2020-05-02 DIAGNOSIS — M47816 Spondylosis without myelopathy or radiculopathy, lumbar region: Secondary | ICD-10-CM | POA: Diagnosis not present

## 2020-05-02 DIAGNOSIS — M5442 Lumbago with sciatica, left side: Secondary | ICD-10-CM | POA: Diagnosis not present

## 2020-05-02 DIAGNOSIS — M545 Low back pain, unspecified: Secondary | ICD-10-CM | POA: Diagnosis not present

## 2020-05-02 DIAGNOSIS — M79605 Pain in left leg: Secondary | ICD-10-CM | POA: Diagnosis not present

## 2020-05-06 ENCOUNTER — Telehealth: Payer: Self-pay | Admitting: *Deleted

## 2020-05-06 DIAGNOSIS — E7521 Fabry (-Anderson) disease: Secondary | ICD-10-CM | POA: Diagnosis not present

## 2020-05-06 NOTE — Telephone Encounter (Signed)
-----   Message from [place] Donovan, [place] sent at 05/05/2020  1:34 PM EST ----- Please call the patient and let him know the X-rays show arthritis in the hips and lower back. If his problem is continuing to improve, no other action is required. If it is not improving, let me know and I will refer to ortho. Thanks, Freeman Caldron, PA-C

## 2020-05-06 NOTE — Telephone Encounter (Signed)
Pt name and DOB verified. Patient aware of results and result note from [facility], [place]. Patient request referral to Ortho. States that the medication is not helping that was prescribed.

## 2020-05-07 ENCOUNTER — Other Ambulatory Visit: Payer: Self-pay | Admitting: Physician Assistant

## 2020-05-07 DIAGNOSIS — M25552 Pain in left hip: Secondary | ICD-10-CM

## 2020-05-07 DIAGNOSIS — M5442 Lumbago with sciatica, left side: Secondary | ICD-10-CM

## 2020-05-07 MED ORDER — DICLOFENAC SODIUM 1 % EX GEL: 4.0000 g | Freq: Four times a day (QID) | CUTANEOUS | 3 refills | Status: DC

## 2020-05-07 NOTE — Telephone Encounter (Signed)
Called pt made aware of RX

## 2020-05-07 NOTE — Telephone Encounter (Signed)
I have placed the referral and have placed an additional prescription for voltaren gel for pain.  Follow up as planned.  Thanks, Freeman Caldron, PA-C

## 2020-05-08 ENCOUNTER — Telehealth: Payer: Self-pay

## 2020-05-08 ENCOUNTER — Ambulatory Visit: Payer: Medicare HMO | Admitting: Neurology

## 2020-05-08 ENCOUNTER — Telehealth: Payer: Self-pay | Admitting: Neurology

## 2020-05-08 ENCOUNTER — Other Ambulatory Visit: Payer: Self-pay

## 2020-05-08 ENCOUNTER — Encounter: Payer: Self-pay | Admitting: Neurology

## 2020-05-08 VITALS — BP 136/64 | HR 77 | Ht 70.0 in | Wt 286.5 lb

## 2020-05-08 DIAGNOSIS — G463 Brain stem stroke syndrome: Secondary | ICD-10-CM | POA: Diagnosis not present

## 2020-05-08 DIAGNOSIS — E7521 Fabry (-Anderson) disease: Secondary | ICD-10-CM

## 2020-05-08 DIAGNOSIS — M5442 Lumbago with sciatica, left side: Secondary | ICD-10-CM

## 2020-05-08 MED ORDER — GABAPENTIN 300 MG PO CAPS: 600.0000 mg | ORAL_CAPSULE | Freq: Three times a day (TID) | ORAL | 11 refills | Status: DC

## 2020-05-08 MED ORDER — TRAMADOL HCL 50 MG PO TABS
50.0000 mg | ORAL_TABLET | Freq: Four times a day (QID) | ORAL | 1 refills | Status: DC | PRN
Start: 2020-05-08 — End: 2020-10-18

## 2020-05-08 NOTE — Telephone Encounter (Signed)
Diclofenac gel PA approved thru 06/06/20

## 2020-05-08 NOTE — Telephone Encounter (Signed)
[employer] medicare order sent to [facility]. They will obtain the auth and reach out to the patient to schedule.  °

## 2020-05-08 NOTE — Progress Notes (Signed)
[facility] [address] [place]. [place] [address] [phone]  CHIEF COMPLAINT:  Chief Complaint  Patient presents with  . Fabry Disease    He has continued getting fabrazyme infusions twice weekly at [facility]. History of CVA.    HPI: [name] is a 59 year old male,  he has history of Fabry's disease, hypertension, insulin-dependent diabetes, obstructive sleep apnea, Mobitz 2, status post pacemaker placement in September 2012, chronic lympha edema of bilateral lower extremity, gout.  His mother and one younger brother was diagnosed with Fabry's disease around 2010, at that time, he presented with progressive worsening bilateral lower extremity lympha edema, he was referred to [facility] for genetic testing, which confirmed a diagnosis of Fabry disease.  He has been treated with Fabrazyme IV infusion every 2 weeks at [facility] since then.  His brother did suffered painful bilateral lower extremity peripheral neuropathy, he denied significant pain, since he started Fabrazyme IV infusion, his bilateral lower extremity lymphedema has been fairly stable, his kidney function fluctuate, but overall creatinine was stabilized around 1.6 in May 11, 2017,  His mother died of stroke at age 50, brother died of stroke at age 84,   He is taking aspirin 81 mg, not MRI candidate due to pacemaker, denies history of strokelike symptoms, he does exercise couple times each week.  Reported previous sleep study showed mild abnormality, but not using CPAP machine, he is active during the day, tends to nod off to sleep after sitting for a while, deny snoring, choking episode.  Echocardiogram on August 25, 2017 showed ejection fraction 55 to 60%, wall motion was normal  [place] of carotid artery showed less than 39% stenosis bilaterally.  He was followed by stroke team since May 2020, when he presented on Oct 19, 2018 with headache, left-sided paresthesia, pain behind his eyes,  suggested possible lacunar infarction involving brainstem due to small vessel disease, though it is not reflected on the CAT scan,  I personally reviewed CT head without contrast on June 05, 2019: No acute abnormality, chronic lacunar infarction involving pons, microvascular changes in hemisphere,  TCD showed diffuse intracranial atherosclerosis.  Carotid Doppler and 2D echo unremarkable.  HIV negative.  UDS negative.  LDL 161 and A1c 7.4.  Recommended DAPT for 3 weeks and Plavix alone.    He is now receiving his Fabrazyme infusion through [facility].  Today he complains 2 weeks history of new onset left-sided low back pain, woke up 1 night using bathroom, as if somebody shot behind me, complains of left low back pain, radiating pain to left lower extremity  I personally reviewed x-ray in 05/11/2020: X-ray of left hip, degenerative changes, no acute abnormality  X-ray of lumbar spine diffuse multilevel degenerative changes, 5 mm anterolisthesis of L4 on L5, no acute abnormality  ROS:   14 system review of systems performed and negative with exception of imbalance   PMH:  Past Medical History:  Diagnosis Date  . Bell's palsy   . Bifascicular block   . Chronic bronchitis ([place])    "seasonal; get it q yr"  . Chronic diastolic CHF (congestive heart failure) ([place]) 2010  . Chronic kidney disease (CKD), stage II (mild)    stage II to III/notes 07/23/2014  . Diastolic heart failure secondary to hypertrophic cardiomyopathy ([place]) CARDIOLOGIST-- DR Daneen Schick  . Dyspnea    increased exertion   . Edema 07/2013  . Fabry's disease ([place]) RENAL AND CARDIAC INVOVLEMENT   FOLLOWED DR [place]  . Gout, arthritis  2014   bil feet. right worse  . History of cellulitis of skin with lymphangitis LEFT LEG  . HOCM (hypertrophic obstructive cardiomyopathy) ([place])    a. Echo 11/16: Severe LVH, EF 55-60%, abnormal GLS consistent with HOCM, no SAM, mild LAE  . Hypertension 20 years  . Lymphedema of  lower extremity LEFT  >  RIGHT   "using ankle high socks at home; pump doesn't work for me" (07/23/2014)  . Pacemaker    02/12/11  . Pneumonia 08/2011  . Seasonal asthma NO INHALERS   30 years  . Second degree Mobitz II AV block    with syncope, s/p PPM  . Short of breath on exertion   . Type II diabetes mellitus ([place]) 2012   INSULIN DEPENDENT    PSH:  Past Surgical History:  Procedure Laterality Date  . CARDIAC CATHETERIZATION  03-12-2011  DR Daneen Schick   HYPERTROPHIC CARDIOMYOPATHY WITH LV CAVITY  APPEARANCE CONSISTANT WITH SIGNIFICANT APICAL HYPERTROPHY/ NORMAL LVSF / EF 55%/ EVIDENCE OF DIASTOLIC DYSFUNCTION WITH EDP OF 23-15mmHg AFTER A-WAVE/ NORMAL CORONARY ARTERIES  . EVALUATION UNDER ANESTHESIA WITH FISTULECTOMY N/A 06/23/2015   Procedure: EXAM UNDER ANESTHESIA WITH POSSIBLE FISTULOTOMY;  Surgeon: Judeth Horn, MD;  Location: [facility];  Service: General;  Laterality: N/A;  . HERNIA REPAIR  [id number]   Umbilical Hernia Repair  . INCISION AND DRAINAGE PERIRECTAL ABSCESS Left 07/29/2014   Procedure: IRRIGATION AND DEBRIDEMENT PERIRECTAL ABSCESS;  Surgeon: Doreen Salvage, MD;  Location: [facility];  Service: General;  Laterality: Left;  Prone position  . INSERT / REPLACE / REMOVE PACEMAKER  02/12/2011   SJM implanted by Dr Rayann Heman for Mobitz II second degree AV block and syncope  . IRRIGATION AND DEBRIDEMENT ABSCESS N/A 07/27/2013   Procedure: IRRIGATION AND DEBRIDEMENT OF SKIN, SOFT TISSUE AND MUSCLES OF UPPER BACK (11X19X4cm) WITH 10 BLADE AND PULSATILE LAVAGE ;  Surgeon: Gayland Curry, MD;  Location: [facility];  Service: General;  Laterality: N/A;  . PPM GENERATOR CHANGEOUT N/A 05/22/2019   Procedure: PPM GENERATOR CHANGEOUT;  Surgeon: Thompson Grayer, MD;  Location: [facility];  Service: Cardiovascular;  Laterality: N/A;  . UMBILICAL HERNIA REPAIR  03/22/2012   Procedure: HERNIA REPAIR UMBILICAL ADULT;  Surgeon: Leighton Ruff, MD;  Location: [facility];  Service: General;  Laterality: N/A;  Umbilical  Hernia Repair     Social History:  Social History   Socioeconomic History  . Marital status: Divorced    Spouse name: Not on file  . Number of children: Not on file  . Years of education: Not on file  . Highest education level: Not on file  Occupational History  . Not on file  Tobacco Use  . Smoking status: Never Smoker  . Smokeless tobacco: Never Used  Vaping Use  . Vaping Use: Never used  Substance and Sexual Activity  . Alcohol use: Yes    Comment: 07/23/2014 "might have a few drinks on holidays or at cookouts"  . Drug use: No  . Sexual activity: Yes  Other Topics Concern  . 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 Difficulty of Paying Living Expenses: Not on file  Food Insecurity:   . Worried About Charity fundraiser in the Last Year: Not on file  . Ran Out of Food in the Last Year: Not on file  Transportation Needs:   . Lack of Transportation (  Medical): Not on file  . Lack of Transportation (Non-Medical): Not on file  Physical Activity:   . Days of Exercise per Week: Not on file  . Minutes of Exercise per Session: Not on file  Stress:   . Feeling of Stress : Not on file  Social Connections:   . Frequency of Communication with Friends and Family: Not on file  . Frequency of Social Gatherings with Friends and Family: Not on file  . Attends Religious Services: Not on file  . Active Member of Clubs or Organizations: Not on file  . Attends Archivist Meetings: Not on file  . Marital Status: Not on file  Intimate Partner Violence:   . Fear of Current or Ex-Partner: Not on file  . Emotionally Abused: Not on file  . Physically Abused: Not on file  . Sexually Abused: Not on file    Family History:  Family History  Problem Relation Age of Onset  . Stroke Mother   . Fabry's disease Mother   . Fabry's disease Brother   . Colon cancer Neg Hx     Medications:    Current Outpatient Medications on File Prior to Visit  Medication Sig Dispense Refill  . Accu-Chek FastClix Lancets MISC 1 each by Other route 3 (three) times daily. 102 each 12  . acetaminophen (TYLENOL) 325 MG tablet Take 1-2 tablets (325-650 mg total) by mouth every 4 (four) hours as needed for mild pain.    . Agalsidase beta (FABRAZYME IV) Inject 115 mg into the vein every 14 (fourteen) days.     [name] Kitchen albuterol (PROAIR HFA) 108 (90 Base) MCG/ACT inhaler Inhale 1 puff into the lungs every 6 (six) hours as needed for wheezing or shortness of breath. 8.5 Inhaler 1  . allopurinol (ZYLOPRIM) 100 MG tablet TAKE 3 TABLETS BY MOUTH EVERY DAY 270 tablet 1  . amLODipine (NORVASC) 10 MG tablet TAKE 1 TABLET BY MOUTH EVERY DAY 90 tablet 2  . atorvastatin (LIPITOR) 80 MG tablet TAKE 1 TABLET (80 MG TOTAL) BY MOUTH DAILY AT 6 PM. 90 tablet 1  . clopidogrel (PLAVIX) 75 MG tablet TAKE 1 TABLET BY MOUTH EVERY DAY 90 tablet 2  . diclofenac Sodium (VOLTAREN) 1 % GEL Apply 4 g topically 4 (four) times daily. 100 g 3  . doxazosin (CARDURA) 2 MG tablet TAKE 0.5 TABLETS (1 MG TOTAL) BY MOUTH DAILY. 45 tablet 0  . fexofenadine (ALLEGRA) 180 MG tablet Take 1 tablet (180 mg total) by mouth daily. 30 tablet 3  . fluticasone (FLONASE) 50 MCG/ACT nasal spray Place 1 spray into both nostrils daily. (Patient taking differently: Place 1 spray into both nostrils daily as needed for allergies. ) 16 g 1  . furosemide (LASIX) 20 MG tablet TAKE 1 TABLET BY MOUTH EVERY DAY 90 tablet 1  . glucose blood (ACCU-CHEK AVIVA PLUS) test strip 1 each by Other route 3 (three) times daily. Use as instructed 100 each 12  . insulin aspart protamine - aspart (NOVOLOG MIX 70/30 FLEXPEN) (70-30) 100 UNIT/ML FlexPen Inject 0.28 mLs (28 Units total) into the skin 2 (two) times daily. 15 mL 11  . Insulin Pen Needle (PX SHORTLENGTH PEN NEEDLES) 31G X 8 MM MISC 1 each by Does not apply route 2 (two) times daily before a meal. 100 each 8  .  losartan-hydrochlorothiazide (HYZAAR) 50-12.5 MG tablet Take 1 tablet by mouth daily. 90 tablet 0  . mupirocin ointment (BACTROBAN) 2 % Apply to affected area daily with dressing change.  30 g 0  . naproxen (NAPROSYN) 500 MG tablet Take 1 tablet (500 mg total) by mouth 2 (two) times daily with a meal. Prn pain 60 tablet 0  . OLIVE LEAF EXTRACT PO Take 1 capsule by mouth daily.     No current facility-administered medications on file prior to visit.    Allergies:   Allergies  Allergen Reactions  . Lisinopril Anaphylaxis  . Shellfish Allergy Hives and Swelling     Physical Exam  Today's Vitals   05/08/20 0736  BP: 136/64  Pulse: 77  Weight: 286 lb 8 oz (130 kg)  Height: 5\' 10"  (1.778 m)   Body mass index is 41.11 kg/m.  PHYSICAL EXAMNIATION:  Gen: NAD, conversant, well nourised, well groomed                     Cardiovascular: Regular rate rhythm, no peripheral edema, warm, nontender. Eyes: Conjunctivae clear without exudates or hemorrhage Neck: Supple, no carotid bruits. Pulmonary: Clear to auscultation bilaterally   NEUROLOGICAL EXAM:  MENTAL STATUS: Speech/Cognition: Awake, alert, normal speech, oriented to history taking and casual conversation.  CRANIAL NERVES: CN II: Visual fields are full to confrontation.  Pupils are round equal and briskly reactive to light. CN III, IV, VI: extraocular movement are normal. No ptosis. CN V: Facial sensation is intact to light touch. CN VII: Face is symmetric with normal eye closure and smile. CN VIII: Hearing is normal to casual conversation CN IX, X: Palate elevates symmetrically. Phonation is normal. CN XI: Head turning and shoulder shrug are intact CN XII: Tongue is midline with normal movements and no atrophy.  MOTOR: Muscle bulk and tone are normal. Muscle strength is normal.  Bilateral lower extremity lymphedema   REFLEXES: Reflexes are 2  and symmetric at the biceps, triceps, knees and ankles. Plantar responses are  flexor.  SENSORY: Intact to light touch, pinprick, positional and vibratory sensation at fingers and toes.  COORDINATION: There is no trunk or limb ataxia.    GAIT/STANCE: Rely on his walker to get up from sitting position, dragging left leg,   ASSESSMENT/PLAN: [name] is a 59 y.o. year old male here with likely brainstem lacunar infarct secondary to small vessel disease too small to be seen on imaging on 10/19/2018. Vascular risk factors include HTN, HLD, DM, Fabry's disease, diastolic heart failure, and prior history of stroke on imaging.    Fabry disease  Continue enzyme infusion through outside agent  Possible small vessel stroke in May 2020 Continue clopidogrel 75 mg daily  and atorvastatin 80 mg for secondary stroke prevention.    Discussed secondary stroke prevention measures and importance of close PCP follow-up for aggressive stroke risk factor management  HTN: BP goal <130/90.  Stable.  Remains on Hyzaar, Lasix, Cardura and Norvasc per PCP  HLD: LDL goal <70.  Remains on atorvastatin 80 mg daily per PCP  DMII: A1c goal<7.0.  Stable per patient.  Remains on NovoLog per PCP  New onset of left low back pain, radiating pain to left lower extremity  Suggestive of left lumbar radiculopathy,  CT of lumbar spine   Marcial Pacas, M.D. Ph.D.  [facility] [place], [place] [address] Phone: [phone] Fax:      [phone]

## 2020-05-09 ENCOUNTER — Other Ambulatory Visit: Payer: Self-pay | Admitting: Neurology

## 2020-05-12 ENCOUNTER — Ambulatory Visit: Payer: Medicare HMO | Admitting: Neurology

## 2020-05-16 ENCOUNTER — Ambulatory Visit: Payer: Medicare HMO | Admitting: Orthopaedic Surgery

## 2020-05-20 ENCOUNTER — Other Ambulatory Visit: Payer: Self-pay

## 2020-05-20 ENCOUNTER — Ambulatory Visit (INDEPENDENT_AMBULATORY_CARE_PROVIDER_SITE_OTHER): Payer: Medicare HMO | Admitting: Orthopaedic Surgery

## 2020-05-20 ENCOUNTER — Ambulatory Visit: Payer: Medicare HMO | Admitting: Internal Medicine

## 2020-05-20 DIAGNOSIS — M4310 Spondylolisthesis, site unspecified: Secondary | ICD-10-CM | POA: Diagnosis not present

## 2020-05-20 DIAGNOSIS — E7521 Fabry (-Anderson) disease: Secondary | ICD-10-CM | POA: Diagnosis not present

## 2020-05-20 NOTE — Progress Notes (Signed)
Office Visit Note   [name]           Date of Birth: 1960/10/05           MRN: [id number] Visit Date: 05/20/2020              Requested by: Argentina Donovan, PA-C [place],  [place] [id number] PCP: Ladell Pier, MD   Assessment & Plan: Visit Diagnoses:  1. Degenerative spondylolisthesis     Plan: Recent x-rays show 5 mm anterolisthesis L4 and L5 with disc space narrowing and facet arthropathy.  No pars defect is noted on oblique films.  We discussed working on weight loss gradually increase his walking to help with weight loss.  He can break up his activities with bending turning twisting such as mopping or sweeping and did just do a little bit wait a day or 2 and then do a little bit more.  We discussed indications for operative intervention or further diagnostic testing since he has a pacemaker and require myelogram CT scan for evaluation if his symptoms progress.  Currently is not having neurogenic claudication symptoms.  He will continue to use the cane to help decrease his falling chances.  He has increased symptoms he can return.  Follow-Up Instructions: Return if symptoms worsen or fail to improve.   Orders:  No orders of the defined types were placed in this encounter.  No orders of the defined types were placed in this encounter.     Procedures: No procedures performed   Clinical Data: No additional findings.   Subjective: Chief Complaint  Patient presents with  . Left Hip - Pain  . Lower Back - Pain    HPI 59 year old male with pain in his left hip and some weakness in the leg for about 3 weeks.  Previous CVA and he has a diagnosis of Fabry's disease and gets enzyme infusion every 2 weeks.  He does housekeeping sweeping turning twisting bending activities tend to bother his back more.  He has been using Naprosyn Voltaren gel and some gabapentin and thinks that his backs actually gotten a little bit better in the last 3  weeks.  Review of Systems previous CVA with left-sided weakness ambulatory with a cane.  Chronic lymphedema.  Decreased sensation bilateral lower extremities with peripheral neuropathy.   Objective: Vital Signs: BP (!) 159/95   Pulse 94   Ht 5\' 10"  (1.778 m)   Wt 286 lb 8 oz (130 kg)   BMI 41.11 kg/m   Physical Exam Constitutional:      Appearance: He is well-developed and well-nourished.  HENT:     Head: Normocephalic and atraumatic.  Eyes:     Extraocular Movements: EOM normal.     Pupils: Pupils are equal, round, and reactive to light.  Neck:     Thyroid: No thyromegaly.     Trachea: No tracheal deviation.  Cardiovascular:     Rate and Rhythm: Normal rate.  Pulmonary:     Effort: Pulmonary effort is normal.     Breath sounds: No wheezing.  Abdominal:     General: Bowel sounds are normal.     Palpations: Abdomen is soft.  Skin:    General: Skin is warm and dry.     Capillary Refill: Capillary refill takes less than 2 seconds.  Neurological:     Mental Status: He is alert and oriented to person, place, and time.  Psychiatric:  Mood and Affect: Mood and affect normal.        Behavior: Behavior normal.        Thought Content: Thought content normal.        Judgment: Judgment normal.     Ortho Exam patient has good anterior tib strength right and left.  Good muscle strength both lower extremities no atrophy.  Bilateral lymphedema lower extremities.  Symmetrical reflexes.  Specialty Comments:  No specialty comments available.  Imaging: No results found.   PMFS History: Patient Active Problem List   Diagnosis Date Noted  . Degenerative spondylolisthesis 05/20/2020  . Acute left-sided low back pain with left-sided sciatica 05/08/2020  . Reactive airway disease 01/24/2020  . Pain due to onychomycosis of toenails of both feet 10/17/2019  . Morbid obesity ([place]) 08/23/2019  . 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 CVA (cerebral vascular accident) ([place])  10/20/2018  . UTI (urinary tract infection) 10/19/2018  . Upper airway cough syndrome 08/01/2018  . Lymphedema 10/06/2016  . Gout, arthritis   . 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 Onychomycosis of toenail 09/26/2014  . Diabetes mellitus type 2 in obese ([place])   . Iron deficiency anemia 07/29/2014  . CKD (chronic kidney disease), stage III ([place])   . Chronic diastolic HF (heart failure) ([place]) 03/27/2013  . Peripheral edema 07/20/2012  . Obstructive sleep apnea, suspected 06/21/2012  . Pacemaker-St.Jude 02/23/2012  . Hypertension 08/09/2011  . Mobitz type II atrioventricular block 05/19/2011  . 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[place]) 05/19/2011   Past Medical History:  Diagnosis Date  . Bell's palsy   . Bifascicular block   . Chronic bronchitis ([place])    "seasonal; get it q yr"  . Chronic diastolic CHF (congestive heart failure) ([place]) 2010  . Chronic kidney disease (CKD), stage II (mild)    stage II to III/notes 07/23/2014  . Diastolic heart failure secondary to hypertrophic cardiomyopathy ([place]) CARDIOLOGIST-- DR Daneen Schick  . Dyspnea    increased exertion   . Edema 07/2013  . Fabry's disease ([place]) RENAL AND CARDIAC INVOVLEMENT   FOLLOWED DR [place]  . Gout, arthritis 2014   bil feet. right worse  . History of cellulitis of skin with lymphangitis LEFT LEG  . HOCM (hypertrophic obstructive cardiomyopathy) ([place])    a. Echo 11/16: Severe LVH, EF 55-60%, abnormal GLS consistent with HOCM, no SAM, mild LAE  . Hypertension 20 years  . Lymphedema of lower extremity LEFT  >  RIGHT   "using ankle high socks at home; pump doesn't work for me" (07/23/2014)  . Pacemaker    02/12/11  . Pneumonia 08/2011  . Seasonal asthma NO INHALERS   30 years  . Second degree Mobitz II AV block    with syncope, s/p PPM  . Short of breath on exertion   . Type II diabetes mellitus ([place]) 2012   INSULIN DEPENDENT    Family History  Problem Relation Age of Onset  . Stroke Mother   . Fabry's disease Mother   . Fabry's  disease Brother   . Colon cancer Neg Hx     Past Surgical History:  Procedure Laterality Date  . CARDIAC CATHETERIZATION  03-12-2011  DR Daneen Schick   HYPERTROPHIC CARDIOMYOPATHY WITH LV CAVITY  APPEARANCE CONSISTANT WITH SIGNIFICANT APICAL HYPERTROPHY/ NORMAL LVSF / EF 55%/ EVIDENCE OF DIASTOLIC DYSFUNCTION WITH EDP OF 23-2mmHg AFTER A-WAVE/ NORMAL CORONARY ARTERIES  . EVALUATION UNDER ANESTHESIA WITH FISTULECTOMY N/A 06/23/2015   Procedure: EXAM UNDER ANESTHESIA WITH POSSIBLE FISTULOTOMY;  Surgeon: Judeth Horn, MD;  Location: [facility];  Service: General;  Laterality: N/A;  . HERNIA REPAIR  [id number]   Umbilical Hernia Repair  . INCISION AND DRAINAGE PERIRECTAL ABSCESS Left 07/29/2014   Procedure: IRRIGATION AND DEBRIDEMENT PERIRECTAL ABSCESS;  Surgeon: Doreen Salvage, MD;  Location: [facility];  Service: General;  Laterality: Left;  Prone position  . INSERT / REPLACE / REMOVE PACEMAKER  02/12/2011   SJM implanted by Dr Rayann Heman for Mobitz II second degree AV block and syncope  . IRRIGATION AND DEBRIDEMENT ABSCESS N/A 07/27/2013   Procedure: IRRIGATION AND DEBRIDEMENT OF SKIN, SOFT TISSUE AND MUSCLES OF UPPER BACK (11X19X4cm) WITH 10 BLADE AND PULSATILE LAVAGE ;  Surgeon: Gayland Curry, MD;  Location: [facility];  Service: General;  Laterality: N/A;  . PPM GENERATOR CHANGEOUT N/A 05/22/2019   Procedure: PPM GENERATOR CHANGEOUT;  Surgeon: Thompson Grayer, MD;  Location: [facility];  Service: Cardiovascular;  Laterality: N/A;  . UMBILICAL HERNIA REPAIR  03/22/2012   Procedure: HERNIA REPAIR UMBILICAL ADULT;  Surgeon: Leighton Ruff, MD;  Location: [facility];  Service: General;  Laterality: N/A;  Umbilical Hernia Repair    Social History   Occupational History  . Not on file  Tobacco Use  . Smoking status: Never Smoker  . Smokeless tobacco: Never Used  Vaping Use  . Vaping Use: Never used  Substance and Sexual Activity  . Alcohol use: Yes    Comment: 07/23/2014 "might have a few drinks on holidays or at cookouts"   . Drug use: No  . Sexual activity: Yes

## 2020-05-21 ENCOUNTER — Other Ambulatory Visit: Payer: Self-pay | Admitting: Physician Assistant

## 2020-05-21 DIAGNOSIS — M25552 Pain in left hip: Secondary | ICD-10-CM

## 2020-05-21 DIAGNOSIS — M5442 Lumbago with sciatica, left side: Secondary | ICD-10-CM

## 2020-05-23 ENCOUNTER — Ambulatory Visit: Payer: Medicare HMO | Admitting: Podiatry

## 2020-05-24 ENCOUNTER — Other Ambulatory Visit: Payer: Self-pay | Admitting: Internal Medicine

## 2020-05-24 DIAGNOSIS — R3589 Other polyuria: Secondary | ICD-10-CM

## 2020-05-24 DIAGNOSIS — I10 Essential (primary) hypertension: Secondary | ICD-10-CM

## 2020-05-24 NOTE — Telephone Encounter (Signed)
Requested Prescriptions  Pending Prescriptions Disp Refills   amLODipine (NORVASC) 10 MG tablet [Pharmacy Med Name: AMLODIPINE BESYLATE 10 MG TAB] 90 tablet     Sig: TAKE 1 TABLET BY MOUTH EVERY DAY     Cardiovascular:  Calcium Channel Blockers Failed - 05/24/2020  9:39 AM      Failed - Last BP in normal range    BP Readings from Last 1 Encounters:  05/20/20 (!) 159/95         Passed - Valid encounter within last 6 months    Recent Outpatient Visits          3 weeks ago Left hip pain   [place], [place], [place]   4 months ago Type 2 diabetes mellitus with other circulatory complication, with long-term current use of insulin ([place])   [place], Neoma Laming B, MD   5 months ago Type 2 diabetes mellitus with other circulatory complication, with long-term current use of insulin [facility])   [place], [place] J, NP   7 months ago Type 2 diabetes mellitus with other circulatory complication, with long-term current use of insulin [facility])   [place] Karle Plumber B, MD   9 months ago Ulcers of both lower legs [facility])   [place], MD      Future Appointments            In 1 month Wynetta Emery Dalbert Batman, MD [place]            doxazosin (CARDURA) 2 MG tablet [Pharmacy Med Name: DOXAZOSIN MESYLATE 2 MG TAB] 45 tablet     Sig: TAKE 0.5 TABLETS BY MOUTH DAILY.     Cardiovascular:  Alpha Blockers Failed - 05/24/2020  9:39 AM      Failed - Last BP in normal range    BP Readings from Last 1 Encounters:  05/20/20 (!) 159/95         Passed - Valid encounter within last 6 months    Recent Outpatient Visits          3 weeks ago Left hip pain   [place], [place], [place]   4 months ago Type 2 diabetes mellitus with other  circulatory complication, with long-term current use of insulin ([place])   [place], Neoma Laming B, MD   5 months ago Type 2 diabetes mellitus with other circulatory complication, with long-term current use of insulin [facility])   [place], [place] J, NP   7 months ago Type 2 diabetes mellitus with other circulatory complication, with long-term current use of insulin [facility] [place])   [place] Karle Plumber B, MD   9 months ago Ulcers of both lower legs [facility])   [place], MD      Future Appointments            In 1 month Wynetta Emery Dalbert Batman, MD [place]            allopurinol (ZYLOPRIM) 100 MG tablet [Pharmacy Med Name: ALLOPURINOL 100 MG TABLET] 270 tablet 0    Sig: TAKE 3 TABLETS BY MOUTH EVERY DAY     Endocrinology:  Gout Agents Failed - 05/24/2020  9:39 AM      Failed - Uric Acid in normal range and within 360 days    Uric Acid  Date Value Ref Range Status  10/06/2016 8.1 3.7 - 8.6 mg/dL Final    Comment:               Therapeutic target for gout patients: <6.0         Failed - Cr in normal range and within 360 days    Creat  Date Value Ref Range Status  11/07/2014 1.28 0.50 - 1.35 mg/dL Final   Creatinine, Ser  Date Value Ref Range Status  08/23/2019 2.02 (H) 0.76 - 1.27 mg/dL Final   Creatinine, Urine  Date Value Ref Range Status  08/30/2014 72.9 mg/dL Final    Comment:    No reference range established.         Passed - Valid encounter within last 12 months    Recent Outpatient Visits          3 weeks ago Left hip pain   [place], [place], [place]   4 months ago Type 2 diabetes mellitus with other circulatory complication, with long-term current use of insulin [facility])   [place] Karle Plumber B, MD   5 months  ago Type 2 diabetes mellitus with other circulatory complication, with long-term current use of insulin [facility])   [place], [place] J, NP   7 months ago Type 2 diabetes mellitus with other circulatory complication, with long-term current use of insulin [facility])   [place], MD   9 months ago Ulcers of both lower legs [facility])   [place], Deborah B, MD      Future Appointments            In 1 month Wynetta Emery Dalbert Batman, MD [place]

## 2020-06-04 DIAGNOSIS — E7521 Fabry (-Anderson) disease: Secondary | ICD-10-CM | POA: Diagnosis not present

## 2020-06-11 ENCOUNTER — Ambulatory Visit
Admission: RE | Admit: 2020-06-11 | Discharge: 2020-06-11 | Disposition: A | Discharge status: Home / Self Care | Payer: Medicare HMO | Source: Ambulatory Visit | Attending: Neurology | Admitting: Neurology

## 2020-06-11 ENCOUNTER — Telehealth: Payer: Self-pay | Admitting: Neurology

## 2020-06-11 DIAGNOSIS — M5442 Lumbago with sciatica, left side: Secondary | ICD-10-CM

## 2020-06-11 DIAGNOSIS — G463 Brain stem stroke syndrome: Secondary | ICD-10-CM

## 2020-06-11 DIAGNOSIS — M545 Low back pain, unspecified: Secondary | ICD-10-CM | POA: Diagnosis not present

## 2020-06-11 DIAGNOSIS — E7521 Fabry (-Anderson) disease: Secondary | ICD-10-CM

## 2020-06-11 NOTE — Telephone Encounter (Signed)
I called the patient.  His back pain is improved on gabapentin, he is no longer having significant discomfort in the back or down the leg.  CT does show anterolisthesis of L4 on L5 with some lateral recess stenosis bilaterally, may be putting the L4 nerve root at risk.  At any rate, the patient is feeling better at this point.   CT lumbar 06/12/19:  IMPRESSION: 1. Degenerative retrolisthesis of L5 with a bulging degenerated uncovered disc and osteophytic ridging. This in combination with advanced facet disease and ligamentum flavum thickening contributes to moderate spinal and bilateral lateral recess stenosis and mild bilateral foraminal stenosis. 2. Mild to moderate spinal and bilateral lateral recess stenosis at L3-4. 3. Aortic atherosclerosis.

## 2020-06-11 NOTE — Telephone Encounter (Signed)
-----   Message from Desmond Lope, RN sent at 06/11/2020 10:33 AM EST ----- Dr. Krista Blue is out this week. ----- Message ----- From: Interface, Rad Results In Sent: 06/11/2020   9:33 AM EST To: Marcial Pacas, MD

## 2020-06-12 ENCOUNTER — Encounter: Payer: Self-pay | Admitting: Internal Medicine

## 2020-06-18 DIAGNOSIS — E7521 Fabry (-Anderson) disease: Secondary | ICD-10-CM | POA: Diagnosis not present

## 2020-06-19 DIAGNOSIS — E7521 Fabry (-Anderson) disease: Secondary | ICD-10-CM | POA: Diagnosis not present

## 2020-06-23 ENCOUNTER — Ambulatory Visit (INDEPENDENT_AMBULATORY_CARE_PROVIDER_SITE_OTHER): Payer: Medicare HMO

## 2020-06-23 DIAGNOSIS — I495 Sick sinus syndrome: Secondary | ICD-10-CM

## 2020-06-23 DIAGNOSIS — I441 Atrioventricular block, second degree: Secondary | ICD-10-CM | POA: Diagnosis not present

## 2020-06-25 ENCOUNTER — Encounter: Payer: Self-pay | Admitting: Internal Medicine

## 2020-06-25 LAB — CUP PACEART REMOTE DEVICE CHECK
Battery Remaining Longevity: 95 mo
Battery Remaining Percentage: 95.5 %
Battery Voltage: 3.01 V
Brady Statistic AP VP Percent: 66 %
Brady Statistic AP VS Percent: 1 %
Brady Statistic AS VP Percent: 34 %
Brady Statistic AS VS Percent: 1 %
Brady Statistic RA Percent Paced: 66 %
Brady Statistic RV Percent Paced: 99 %
Date Time Interrogation Session: 20220117180800
Implantable Lead Implant Date: 20120907
Implantable Lead Implant Date: 20120907
Implantable Lead Location: 753859
Implantable Lead Location: 753860
Implantable Pulse Generator Implant Date: 20201215
Lead Channel Impedance Value: 360 Ohm
Lead Channel Impedance Value: 410 Ohm
Lead Channel Pacing Threshold Amplitude: 0.625 V
Lead Channel Pacing Threshold Amplitude: 1.5 V
Lead Channel Pacing Threshold Pulse Width: 0.5 ms
Lead Channel Pacing Threshold Pulse Width: 0.8 ms
Lead Channel Sensing Intrinsic Amplitude: 5 mV
Lead Channel Setting Pacing Amplitude: 1.625
Lead Channel Setting Pacing Amplitude: 1.75 V
Lead Channel Setting Pacing Pulse Width: 0.8 ms
Lead Channel Setting Sensing Sensitivity: 4 mV
Pulse Gen Model: 2272
Pulse Gen Serial Number: 9187005

## 2020-07-02 DIAGNOSIS — E7521 Fabry (-Anderson) disease: Secondary | ICD-10-CM | POA: Diagnosis not present

## 2020-07-07 DIAGNOSIS — I890 Lymphedema, not elsewhere classified: Secondary | ICD-10-CM | POA: Diagnosis not present

## 2020-07-07 DIAGNOSIS — E118 Type 2 diabetes mellitus with unspecified complications: Secondary | ICD-10-CM | POA: Diagnosis not present

## 2020-07-07 DIAGNOSIS — E7521 Fabry (-Anderson) disease: Secondary | ICD-10-CM | POA: Diagnosis not present

## 2020-07-07 DIAGNOSIS — I129 Hypertensive chronic kidney disease with stage 1 through stage 4 chronic kidney disease, or unspecified chronic kidney disease: Secondary | ICD-10-CM | POA: Diagnosis not present

## 2020-07-07 DIAGNOSIS — M109 Gout, unspecified: Secondary | ICD-10-CM | POA: Diagnosis not present

## 2020-07-07 DIAGNOSIS — N1831 Chronic kidney disease, stage 3a: Secondary | ICD-10-CM | POA: Diagnosis not present

## 2020-07-07 DIAGNOSIS — I441 Atrioventricular block, second degree: Secondary | ICD-10-CM | POA: Diagnosis not present

## 2020-07-07 DIAGNOSIS — I639 Cerebral infarction, unspecified: Secondary | ICD-10-CM | POA: Diagnosis not present

## 2020-07-07 NOTE — Progress Notes (Signed)
Remote pacemaker transmission.   

## 2020-07-08 ENCOUNTER — Other Ambulatory Visit: Payer: Self-pay

## 2020-07-08 ENCOUNTER — Encounter: Payer: Self-pay | Admitting: Internal Medicine

## 2020-07-08 ENCOUNTER — Ambulatory Visit: Payer: Medicare HMO | Attending: Internal Medicine | Admitting: Internal Medicine

## 2020-07-08 VITALS — BP 134/82 | HR 63 | Temp 97.9°F | Resp 16 | Wt 288.0 lb

## 2020-07-08 DIAGNOSIS — I5032 Chronic diastolic (congestive) heart failure: Secondary | ICD-10-CM

## 2020-07-08 DIAGNOSIS — I10 Essential (primary) hypertension: Secondary | ICD-10-CM | POA: Diagnosis not present

## 2020-07-08 DIAGNOSIS — Z794 Long term (current) use of insulin: Secondary | ICD-10-CM

## 2020-07-08 DIAGNOSIS — Z23 Encounter for immunization: Secondary | ICD-10-CM

## 2020-07-08 DIAGNOSIS — E1159 Type 2 diabetes mellitus with other circulatory complications: Secondary | ICD-10-CM

## 2020-07-08 DIAGNOSIS — Z6841 Body Mass Index (BMI) 40.0 and over, adult: Secondary | ICD-10-CM | POA: Diagnosis not present

## 2020-07-08 DIAGNOSIS — L97919 Non-pressure chronic ulcer of unspecified part of right lower leg with unspecified severity: Secondary | ICD-10-CM | POA: Insufficient documentation

## 2020-07-08 DIAGNOSIS — N1831 Chronic kidney disease, stage 3a: Secondary | ICD-10-CM | POA: Diagnosis not present

## 2020-07-08 DIAGNOSIS — M48061 Spinal stenosis, lumbar region without neurogenic claudication: Secondary | ICD-10-CM | POA: Diagnosis not present

## 2020-07-08 LAB — GLUCOSE, POCT (MANUAL RESULT ENTRY): POC Glucose: 159 mg/dl — AB (ref 70–99)

## 2020-07-08 LAB — POCT GLYCOSYLATED HEMOGLOBIN (HGB A1C): HbA1c, POC (controlled diabetic range): 7.8 % — AB (ref 0.0–7.0)

## 2020-07-08 NOTE — Patient Instructions (Addendum)
I encourage you to get your Covid booster shot.  You can go to [facility] health website to schedule that.  Influenza Virus Vaccine injection (Fluarix) What is this medicine? INFLUENZA VIRUS VACCINE (in floo EN zuh VAHY ruhs vak SEEN) helps to reduce the risk of getting influenza also known as the flu. This medicine may be used for other purposes; ask your health care provider or pharmacist if you have questions. COMMON BRAND NAME(S): Fluarix, Fluzone What should I tell my health care provider before I take this medicine? They need to know if you have any of these conditions:  bleeding disorder like hemophilia  fever or infection  Guillain-Barre syndrome or other neurological problems  immune system problems  infection with the human immunodeficiency virus (HIV) or AIDS  low blood platelet counts  multiple sclerosis  an unusual or allergic reaction to influenza virus vaccine, eggs, chicken proteins, latex, gentamicin, other medicines, foods, dyes or preservatives  pregnant or trying to get pregnant  breast-feeding How should I use this medicine? This vaccine is for injection into a muscle. It is given by a health care professional. A copy of Vaccine Information Statements will be given before each vaccination. Read this sheet carefully each time. The sheet may change frequently. Talk to your pediatrician regarding the use of this medicine in children. Special care may be needed. Overdosage: If you think you have taken too much of this medicine contact a poison control center or emergency room at once. NOTE: This medicine is only for you. Do not share this medicine with others. What if I miss a dose? This does not apply. What may interact with this medicine?  chemotherapy or radiation therapy  medicines that lower your immune system like etanercept, anakinra, infliximab, and adalimumab  medicines that treat or prevent blood clots like warfarin  phenytoin  steroid medicines  like prednisone or cortisone  theophylline  vaccines This list may not describe all possible interactions. Give your health care provider a list of all the medicines, herbs, non-prescription drugs, or dietary supplements you use. Also tell them if you smoke, drink alcohol, or use illegal drugs. Some items may interact with your medicine. What should I watch for while using this medicine? Report any side effects that do not go away within 3 days to your doctor or health care professional. Call your health care provider if any unusual symptoms occur within 6 weeks of receiving this vaccine. You may still catch the flu, but the illness is not usually as bad. You cannot get the flu from the vaccine. The vaccine will not protect against colds or other illnesses that may cause fever. The vaccine is needed every year. What side effects may I notice from receiving this medicine? Side effects that you should report to your doctor or health care professional as soon as possible:  allergic reactions like skin rash, itching or hives, swelling of the face, lips, or tongue Side effects that usually do not require medical attention (report to your doctor or health care professional if they continue or are bothersome):  fever  headache  muscle aches and pains  pain, tenderness, redness, or swelling at site where injected  weak or tired This list may not describe all possible side effects. Call your doctor for medical advice about side effects. You may report side effects to FDA at 1-800-FDA-1088. Where should I keep my medicine? This vaccine is only given in a clinic, pharmacy, doctor's office, or other health care setting and will not  be stored at home. NOTE: This sheet is a summary. It may not cover all possible information. If you have questions about this medicine, talk to your doctor, pharmacist, or health care provider.  2021 Elsevier/Gold Standard (2007-12-20 09:30:40)

## 2020-07-08 NOTE — Progress Notes (Signed)
[name], male    DOB: Dec 16, 1960  MRN: [id number]  CC: Diabetes and Hypertension   Subjective: [name] is a 60 y.o. male who presents for chronic ds management His concerns today include:  Patient with history of diabetes type 2 with microalbuminuria, diastolic CHF with pacemaker (secconary to AV block with syncopy), CKD stage III, HOCM, HTN, HL,CVA,lymphedema. 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  Pt had televisit with PA 04/2020 for radicular symptom in LT leg.  Placed on Naprosyn.  X-ray of the lumbar spine revealed diffuse multilevel degenerative change with 5 mm anterior listhesis L4 on L5.  X-ray of the hip revealed degenerative changes in the lumbar spine and both hips.  Patient was referred to Dr. Lorin Mercy.  Who diagnosed him with degenerative spondylolisthesis.  He recommended weight loss.  He discussed when there would be indication for surgery but did not recommend that at this time.  Patient subsequently had CAT scan of the lumbar spine done by his neurologist Dr. Evelena Leyden.  This revealed: IMPRESSION: 1. Degenerative retrolisthesis of L5 with a bulging degenerated uncovered disc and osteophytic ridging. This in combination with advanced facet disease and ligamentum flavum thickening contributes to moderate spinal and bilateral lateral recess stenosis and mild bilateral foraminal stenosis. 2. Mild to moderate spinal and bilateral lateral recess stenosis at L3-4. 3. Aortic atherosclerosis.  I advised him to stop the Naprosyn through a MyChart message because of his kidney function.  He was placed on gabapentin by the neurologist and found this to be helpful.  Pain has since resolved.  He stopped the gabapentin.  He has not had any falls.  DIABETES TYPE 2/Obesity Last A1C:   Results for orders placed or performed in visit on 07/08/20  POCT glucose (manual entry)  Result Value Ref Range   POC Glucose 159 (A) 70 - 99 mg/dl   POCT glycosylated hemoglobin (Hb A1C)  Result Value Ref Range   Hemoglobin A1C     HbA1c POC (<> result, manual entry)     HbA1c, POC (prediabetic range)     HbA1c, POC (controlled diabetic range) 7.8 (A) 0.0 - 7.0 %    Med Adherence:  [x]  Yes  On Novolog 70/30 24 units BID Medication side effects:  []  Yes    [x]  No Home Monitoring?  [x]  Yes  TID  Home glucose results range:before BF 107-110, before lunch 125-130 and at bedtime 122 Diet Adherence: [x]  Yes "I'm really trying to stay away from the sodas, the cookies and cake." Exercise: [x]  Yes -wgh down 11 lbs since 10/2019.  Plans to start going to [place] on the [employer] His neighborhood [place].  Does his own house work but has to take breaks inbetween Hypoglycemic episodes?: []  Yes    [x]  No Numbness of the feet? []  Yes    []  No Retinopathy hx? []  Yes    []  No Last eye exam:  Comments:   HYPERTENSION/diastolic CHF Currently taking: see medication list.  He is on amlodipine, furosemide, Cozaar/HCTZ. Med Adherence: [x]  Yes and took already this a.m    []  No Medication side effects: []  Yes    [x]  No Adherence with salt restriction: [x]  Yes    []  No Home Monitoring?: []  Yes    [x]  No Monitoring Frequency: []  Yes    []  No Home BP results range: []  Yes    []  No SOB? []  Yes    [x]   No Chest Pain?: []  Yes    [x]  No Leg swelling?: [x]  Yes -continues to wrap legs daily Headaches?: []  Yes    [x]  No Dizziness? []  Yes    [x]  No Comments:   Polyuria: Placed on doxazosin on last visit for possible BPH symptoms.  Patient reports polyuria has resolved with the medicine.  CKD 3a/Fabry ds: He continues to follow with his nephrologist intermittently.  Last visit was September of last year.  Labs on that visit were as follows: CBC-H/H 14.9/47.7.  Platelet count 236 UA revealed 2+ protein Urine microalbumin of 629 LFTs normal BUN/creatinine 31/1.9.  GFR 43 PTH 109 -continues to get enzyme infusions Q 2 wks  HM: due  for flu.  Has not had COVID booster as yet   Patient Active Problem List   Diagnosis Date Noted  . Influenza vaccine needed 07/08/2020  . Ulcers of both lower legs ([place]) 07/08/2020  . Spinal stenosis of lumbar region without neurogenic claudication 07/08/2020  . Degenerative spondylolisthesis 05/20/2020  . Acute left-sided low back pain with left-sided sciatica 05/08/2020  . Reactive airway disease 01/24/2020  . Pain due to onychomycosis of toenails of both feet 10/17/2019  . Morbid obesity ([place]) 08/23/2019  . 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 CVA (cerebral vascular accident) ([place]) 10/20/2018  . UTI (urinary tract infection) 10/19/2018  . Upper airway cough syndrome 08/01/2018  . Lymphedema 10/06/2016  . Gout, arthritis   . 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 Onychomycosis of toenail 09/26/2014  . Diabetes mellitus type 2 in obese ([place])   . Iron deficiency anemia 07/29/2014  . CKD (chronic kidney disease), stage III ([place])   . Chronic diastolic HF (heart failure) ([place]) 03/27/2013  . Peripheral edema 07/20/2012  . Obstructive sleep apnea, suspected 06/21/2012  . Pacemaker-St.Jude 02/23/2012  . Hypertension 08/09/2011  . Mobitz type II atrioventricular block 05/19/2011  . 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[place]) 05/19/2011     Current Outpatient Medications on File Prior to Visit  Medication Sig Dispense Refill  . Accu-Chek FastClix Lancets MISC 1 each by Other route 3 (three) times daily. 102 each 12  . acetaminophen (TYLENOL) 325 MG tablet Take 1-2 tablets (325-650 mg total) by mouth every 4 (four) hours as needed for mild pain.    . Agalsidase beta (FABRAZYME IV) Inject 115 mg into the vein every 14 (fourteen) days.     [name] Kitchen albuterol (PROAIR HFA) 108 (90 Base) MCG/ACT inhaler Inhale 1 puff into the lungs every 6 (six) hours as needed for wheezing or shortness of breath. 8.5 Inhaler 1  . allopurinol (ZYLOPRIM) 100 MG tablet TAKE 3 TABLETS BY MOUTH EVERY DAY 270 tablet 0  . amLODipine (NORVASC) 10  MG tablet TAKE 1 TABLET BY MOUTH EVERY DAY 90 tablet 2  . atorvastatin (LIPITOR) 80 MG tablet TAKE 1 TABLET (80 MG TOTAL) BY MOUTH DAILY AT 6 PM. 90 tablet 1  . clopidogrel (PLAVIX) 75 MG tablet TAKE 1 TABLET BY MOUTH EVERY DAY 90 tablet 2  . diclofenac Sodium (VOLTAREN) 1 % GEL Apply 4 g topically 4 (four) times daily. 100 g 3  . doxazosin (CARDURA) 2 MG tablet TAKE 0.5 TABLETS (1 MG TOTAL) BY MOUTH DAILY. 45 tablet 0  . fexofenadine (ALLEGRA) 180 MG tablet Take 1 tablet (180 mg total) by mouth daily. 30 tablet 3  . fluticasone (FLONASE) 50 MCG/ACT nasal spray Place 1 spray into both nostrils daily. (Patient taking differently: Place 1 spray into both nostrils daily as needed for allergies. ) 16 g  1  . furosemide (LASIX) 20 MG tablet TAKE 1 TABLET BY MOUTH EVERY DAY 90 tablet 1  . gabapentin (NEURONTIN) 300 MG capsule Take 2 capsules (600 mg total) by mouth 3 (three) times daily. 180 capsule 11  . glucose blood (ACCU-CHEK AVIVA PLUS) test strip 1 each by Other route 3 (three) times daily. Use as instructed 100 each 12  . insulin aspart protamine - aspart (NOVOLOG MIX 70/30 FLEXPEN) (70-30) 100 UNIT/ML FlexPen Inject 0.28 mLs (28 Units total) into the skin 2 (two) times daily. 15 mL 11  . Insulin Pen Needle (PX SHORTLENGTH PEN NEEDLES) 31G X 8 MM MISC 1 each by Does not apply route 2 (two) times daily before a meal. 100 each 8  . losartan-hydrochlorothiazide (HYZAAR) 50-12.5 MG tablet Take 1 tablet by mouth daily. 90 tablet 0  . mupirocin ointment (BACTROBAN) 2 % Apply to affected area daily with dressing change. 30 g 0  . naproxen (NAPROSYN) 500 MG tablet TAKE 1 TABLET BY MOUTH TWICE A DAY AS NEEDED FOR PAIN 60 tablet 2  . OLIVE LEAF EXTRACT PO Take 1 capsule by mouth daily.    . traMADol (ULTRAM) 50 MG tablet Take 1 tablet (50 mg total) by mouth every 6 (six) hours as needed for severe pain. 30 tablet 1   No current facility-administered medications on file prior to visit.    Allergies   Allergen Reactions  . Lisinopril Anaphylaxis  . Shellfish Allergy Hives and Swelling    Social History   Socioeconomic History  . Marital status: Divorced    Spouse name: Not on file  . Number of children: Not on file  . Years of education: Not on file  . Highest education level: Not on file  Occupational History  . Not on file  Tobacco Use  . Smoking status: Never Smoker  . Smokeless tobacco: Never Used  Vaping Use  . Vaping Use: Never used  Substance and Sexual Activity  . Alcohol use: Yes    Comment: 07/23/2014 "might have a few drinks on holidays or at cookouts"  . Drug use: No  . Sexual activity: Yes  Other Topics Concern  . Not on file  Social History Narrative   Coaches [employer]    Social Determinants of Health   Financial Resource Strain: Not on file  Food Insecurity: Not on file  Transportation Needs: Not on file  Physical Activity: Not on file  Stress: Not on file  Social Connections: Not on file  Intimate Partner Violence: Not on file    Family History  Problem Relation Age of Onset  . Stroke Mother   . Fabry's disease Mother   . Fabry's disease Brother   . Colon cancer Neg Hx     Past Surgical History:  Procedure Laterality Date  . CARDIAC CATHETERIZATION  03-12-2011  DR Daneen Schick   HYPERTROPHIC CARDIOMYOPATHY WITH LV CAVITY  APPEARANCE CONSISTANT WITH SIGNIFICANT APICAL HYPERTROPHY/ NORMAL LVSF / EF 55%/ EVIDENCE OF DIASTOLIC DYSFUNCTION WITH EDP OF 23-60mmHg AFTER A-WAVE/ NORMAL CORONARY ARTERIES  . EVALUATION UNDER ANESTHESIA WITH FISTULECTOMY N/A 06/23/2015   Procedure: EXAM UNDER ANESTHESIA WITH POSSIBLE FISTULOTOMY;  Surgeon: Judeth Horn, MD;  Location: [facility];  Service: General;  Laterality: N/A;  . HERNIA REPAIR  [id number]   Umbilical Hernia Repair  . INCISION AND DRAINAGE PERIRECTAL ABSCESS Left 07/29/2014   Procedure: IRRIGATION AND DEBRIDEMENT PERIRECTAL ABSCESS;  Surgeon: Doreen Salvage, MD;  Location: [facility];  Service: General;   Laterality: Left;  Prone position  . INSERT / REPLACE / REMOVE PACEMAKER  02/12/2011   SJM implanted by Dr Rayann Heman for Mobitz II second degree AV block and syncope  . IRRIGATION AND DEBRIDEMENT ABSCESS N/A 07/27/2013   Procedure: IRRIGATION AND DEBRIDEMENT OF SKIN, SOFT TISSUE AND MUSCLES OF UPPER BACK (11X19X4cm) WITH 10 BLADE AND PULSATILE LAVAGE ;  Surgeon: Gayland Curry, MD;  Location: [facility];  Service: General;  Laterality: N/A;  . PPM GENERATOR CHANGEOUT N/A 05/22/2019   Procedure: PPM GENERATOR CHANGEOUT;  Surgeon: Thompson Grayer, MD;  Location: [facility];  Service: Cardiovascular;  Laterality: N/A;  . UMBILICAL HERNIA REPAIR  03/22/2012   Procedure: HERNIA REPAIR UMBILICAL ADULT;  Surgeon: Leighton Ruff, MD;  Location: [facility];  Service: General;  Laterality: N/A;  Umbilical Hernia Repair     ROS: Review of Systems Negative except as stated above  PHYSICAL EXAM: BP 134/82   Pulse 63   Temp 97.9 F (36.6 C)   Resp 16   Wt 288 lb (130.6 kg)   SpO2 96%   BMI 41.32 kg/m   Wt Readings from Last 3 Encounters:  07/08/20 288 lb (130.6 kg)  05/20/20 286 lb 8 oz (130 kg)  05/08/20 286 lb 8 oz (130 kg)    Physical Exam  General appearance - alert, well appearing, obese older African-American male and in no distress.  He ambulates with a cane. Mental status - normal mood, behavior, speech, dress, motor activity, and thought processes Neck - supple, no significant adenopathy Chest - clear to auscultation, no wheezes, rales or rhonchi, symmetric air entry Heart - normal rate, regular rhythm, normal S1, S2, no murmurs, rubs, clicks or gallops Extremities -he is wearing compression socks and both legs are wrapped with Velcro wraps CMP Latest Ref Rng & Units 08/23/2019 05/22/2019 05/22/2019  Glucose 65 - 99 mg/dL 147(H) 133(H) 138(H)  BUN 6 - 24 mg/dL 32(H) 36(H) 34(H)  Creatinine 0.76 - 1.27 mg/dL 2.02(H) 2.00(H) 2.23(H)  Sodium 134 - 144 mmol/L 143 142 140  Potassium 3.5 - 5.2  mmol/L 4.6 4.0 6.6(HH)  Chloride 96 - 106 mmol/L 105 107 106  CO2 20 - 29 mmol/L 21 - 21(L)  Calcium 8.7 - 10.2 mg/dL 9.8 - 9.1  Total Protein 6.0 - 8.5 g/dL 7.5 - -  Total Bilirubin 0.0 - 1.2 mg/dL 0.4 - -  Alkaline Phos 39 - 117 IU/L 126(H) - -  AST 0 - 40 IU/L 41(H) - -  ALT 0 - 44 IU/L 43 - -   Lipid Panel     Component Value Date/Time   CHOL 159 08/23/2019 1152   TRIG 63 08/23/2019 1152   HDL 59 08/23/2019 1152   CHOLHDL 2.7 08/23/2019 1152   CHOLHDL 4.4 10/20/2018 0324   VLDL 18 10/20/2018 0324   LDLCALC 87 08/23/2019 1152    CBC    Component Value Date/Time   WBC 7.4 08/23/2019 1152   WBC 7.0 05/22/2019 1201   RBC 5.09 08/23/2019 1152   RBC 5.29 05/22/2019 1201   HGB 14.7 08/23/2019 1152   HCT 44.2 08/23/2019 1152   PLT 193 05/22/2019 1201   PLT 221 02/13/2019 0854   MCV 87 08/23/2019 1152   MCH 28.9 08/23/2019 1152   MCH 29.3 05/22/2019 1201   MCHC 33.3 08/23/2019 1152   MCHC 32.1 05/22/2019 1201   RDW 13.2 08/23/2019 1152   LYMPHSABS 2.9 08/23/2019 1152   MONOABS 1.3 (H) 10/24/2018 0515   EOSABS 0.3 08/23/2019 1152  BASOSABS 0.1 08/23/2019 1152    ASSESSMENT AND PLAN: 1. Type 2 diabetes mellitus with other circulatory complication, with long-term current use of insulin (HCC) A1c is not at goal but blood sugars are.  I have made no changes to the dose of his insulin at this time.  Will refer him to nutritionist for dietary counseling.  Patient also plans to start going to the gym a few times a week to walk on treadmill.  Advised to start low and go slow. - POCT glucose (manual entry) - POCT glycosylated hemoglobin (Hb A1C) - Amb ref to Medical Nutrition Therapy-MNT - Lipid panel - Comprehensive metabolic panel  2. Essential hypertension Not at goal but close.  Continue current medications and low-salt diet  3. Chronic diastolic (congestive) heart failure (HCC) Compensated.  Continue current medications and low-salt diet  4. Class 3 severe obesity  due to excess calories with serious comorbidity and body mass index (BMI) of 40.0 to 44.9 in adult [facility]) See #1 above. - Amb ref to Medical Nutrition Therapy-MNT  5. Stage 3 chronic kidney disease, unspecified whether stage 3a or 3b CKD ([place]) Stable and followed by nephrology  6. Spinal stenosis of lumbar region without neurogenic claudication Patient has stopped the gabapentin because he no longer has the sciatica symptoms in the left leg.  I advised him not to discard the gabapentin as he may need to restart it in the future should symptoms return.  7. Influenza vaccine needed 8. Need for immunization against influenza Given - Flu Vaccine QUAD 36+ mos IM    Patient was given the opportunity to ask questions.  Patient verbalized understanding of the plan and was able to repeat key elements of the plan.   Orders Placed This Encounter  Procedures  . Flu Vaccine QUAD 36+ mos IM  . Lipid panel  . Comprehensive metabolic panel  . Amb ref to Medical Nutrition Therapy-MNT  . POCT glucose (manual entry)  . POCT glycosylated hemoglobin (Hb A1C)     Requested Prescriptions    No prescriptions requested or ordered in this encounter    Return in about 4 months (around 11/05/2020) for Give appt with Lurena Joiner in 1 mth for Medicare Wellness Visit.  Karle Plumber, MD, FACP

## 2020-07-09 LAB — COMPREHENSIVE METABOLIC PANEL
ALT: 38 IU/L (ref 0–44)
AST: 35 IU/L (ref 0–40)
Albumin/Globulin Ratio: 1.1 — ABNORMAL LOW (ref 1.2–2.2)
Albumin: 4.1 g/dL (ref 3.8–4.9)
Alkaline Phosphatase: 119 IU/L (ref 44–121)
BUN/Creatinine Ratio: 12 (ref 9–20)
BUN: 23 mg/dL (ref 6–24)
Bilirubin Total: 0.8 mg/dL (ref 0.0–1.2)
CO2: 24 mmol/L (ref 20–29)
Calcium: 10 mg/dL (ref 8.7–10.2)
Chloride: 103 mmol/L (ref 96–106)
Creatinine, Ser: 1.96 mg/dL — ABNORMAL HIGH (ref 0.76–1.27)
GFR calc Af Amer: 42 mL/min/{1.73_m2} — ABNORMAL LOW (ref >59)
GFR calc non Af Amer: 36 mL/min/{1.73_m2} — ABNORMAL LOW (ref >59)
Globulin, Total: 3.7 g/dL (ref 1.5–4.5)
Glucose: 163 mg/dL — ABNORMAL HIGH (ref 65–99)
Potassium: 4.6 mmol/L (ref 3.5–5.2)
Sodium: 143 mmol/L (ref 134–144)
Total Protein: 7.8 g/dL (ref 6.0–8.5)

## 2020-07-09 LAB — LIPID PANEL
Chol/HDL Ratio: 2.6 ratio (ref 0.0–5.0)
Cholesterol, Total: 165 mg/dL (ref 100–199)
HDL: 63 mg/dL (ref >39)
LDL Chol Calc (NIH): 89 mg/dL (ref 0–99)
Triglycerides: 69 mg/dL (ref 0–149)
VLDL Cholesterol Cal: 13 mg/dL (ref 5–40)

## 2020-07-23 DIAGNOSIS — E7521 Fabry (-Anderson) disease: Secondary | ICD-10-CM | POA: Diagnosis not present

## 2020-07-29 ENCOUNTER — Other Ambulatory Visit: Payer: Self-pay | Admitting: Internal Medicine

## 2020-07-29 DIAGNOSIS — I10 Essential (primary) hypertension: Secondary | ICD-10-CM

## 2020-07-29 MED ORDER — LOSARTAN POTASSIUM-HCTZ 50-12.5 MG PO TABS: 1.0000 | ORAL_TABLET | Freq: Every day | ORAL | 2 refills | Status: DC

## 2020-07-30 ENCOUNTER — Other Ambulatory Visit: Payer: Self-pay | Admitting: Internal Medicine

## 2020-07-30 ENCOUNTER — Encounter: Payer: Self-pay | Admitting: Internal Medicine

## 2020-07-30 DIAGNOSIS — I10 Essential (primary) hypertension: Secondary | ICD-10-CM

## 2020-07-30 NOTE — Telephone Encounter (Signed)
  Notes to clinic:  Product Backordered/Unavailable:ON BACKORDER PLEASE SEND IN ALT, NO STRENGH AVAILABLE. NO PLAIN EITHER   Requested Prescriptions  Pending Prescriptions Disp Refills   losartan-hydrochlorothiazide (HYZAAR) 50-12.5 MG tablet [Pharmacy Med Name: LOSARTAN-HCTZ 50-12.5 MG TAB] 90 tablet 2    Sig: TAKE 1 TABLET BY MOUTH EVERY DAY      Cardiovascular: ARB + Diuretic Combos Failed - 07/30/2020 10:52 AM      Failed - Cr in normal range and within 180 days    Creat  Date Value Ref Range Status  11/07/2014 1.28 0.50 - 1.35 mg/dL Final   Creatinine, Ser  Date Value Ref Range Status  07/08/2020 1.96 (H) 0.76 - 1.27 mg/dL Final   Creatinine, Urine  Date Value Ref Range Status  08/30/2014 72.9 mg/dL Final    Comment:    No reference range established.          Passed - K in normal range and within 180 days    Potassium  Date Value Ref Range Status  07/08/2020 4.6 3.5 - 5.2 mmol/L Final          Passed - Na in normal range and within 180 days    Sodium  Date Value Ref Range Status  07/08/2020 143 134 - 144 mmol/L Final          Passed - Ca in normal range and within 180 days    Calcium  Date Value Ref Range Status  07/08/2020 10.0 8.7 - 10.2 mg/dL Final   Calcium, Ion  Date Value Ref Range Status  05/22/2019 1.16 1.15 - 1.40 mmol/L Final          Passed - Patient is not pregnant      Passed - Last BP in normal range    BP Readings from Last 1 Encounters:  07/08/20 134/82          Passed - Valid encounter within last 6 months    Recent Outpatient Visits           3 weeks ago Type 2 diabetes mellitus with other circulatory complication, with long-term current use of insulin ([place])   [place], Neoma Laming B, MD   3 months ago Left hip pain   [place], [place], [place]   6 months ago Type 2 diabetes mellitus with other circulatory complication, with long-term current use of  insulin [facility])   [place], Neoma Laming B, MD   7 months ago Type 2 diabetes mellitus with other circulatory complication, with long-term current use of insulin [facility])   [place], [place] J, NP   9 months ago Type 2 diabetes mellitus with other circulatory complication, with long-term current use of insulin [facility])   [place], Deborah B, MD       Future Appointments             In 3 months Wynetta Emery, Dalbert Batman, MD [place]

## 2020-07-31 MED ORDER — VALSARTAN-HYDROCHLOROTHIAZIDE 80-12.5 MG PO TABS: 1.0000 | ORAL_TABLET | Freq: Every day | ORAL | 6 refills | Status: DC

## 2020-08-07 DIAGNOSIS — E7521 Fabry (-Anderson) disease: Secondary | ICD-10-CM | POA: Diagnosis not present

## 2020-08-10 ENCOUNTER — Encounter: Payer: Self-pay | Admitting: Internal Medicine

## 2020-08-10 DIAGNOSIS — R0981 Nasal congestion: Secondary | ICD-10-CM

## 2020-08-10 MED ORDER — FLUTICASONE PROPIONATE 50 MCG/ACT NA SUSP: 1.0000 | Freq: Every day | NASAL | 3 refills | Status: DC | PRN

## 2020-08-15 ENCOUNTER — Encounter: Payer: Medicare HMO | Attending: Internal Medicine | Admitting: Dietician

## 2020-08-15 ENCOUNTER — Encounter: Payer: Self-pay | Admitting: Dietician

## 2020-08-15 ENCOUNTER — Other Ambulatory Visit: Payer: Self-pay

## 2020-08-15 DIAGNOSIS — E669 Obesity, unspecified: Secondary | ICD-10-CM | POA: Diagnosis not present

## 2020-08-15 DIAGNOSIS — E1169 Type 2 diabetes mellitus with other specified complication: Secondary | ICD-10-CM | POA: Insufficient documentation

## 2020-08-15 NOTE — Patient Instructions (Addendum)
Continue to be mindful   Eating in the kitchen.  Serving single portions  Continue to stay active.  Aim for 30 minutes most days.  This can be split up.  Read the Nutrition Labels  Choose a low sodium diet  Continue to avoid added salt  Avoid processed meat to include bacon.  Choose low sodium [place]  Limit portion size of chips or avoid.  Low sodium pasta sauce (Silver Palate low sodium)  Rinse off any canned vegetables, buy low sodium or get frozen vegetables without salt or fresh vegetables  A meal should be no more than about 600 mg sodium. (2000 mg per day or less)  Rethink what you drink.  Avoid beverages with carbohydrates  Raw veges with nonfat sour cream with Mrs Deliah Boston (or garlic and onion powder, dill)

## 2020-08-15 NOTE — Progress Notes (Signed)
Medical Nutrition Therapy  Appointment Start time:  [id number]  Appointment End time:  1601  Primary concerns today: Patient is here today alone.  He would like to learn more about how to eat to control his blood sugars and weight.  He was last seen by an RD at our office in 2016.  His blood sugar control has improved since then.  Referral diagnosis: Type 2 Diabetes, obesity Preferred learning style: no preference indicated Learning readiness: ready   NUTRITION ASSESSMENT   Anthropometrics  Wt Readings from Last 3 Encounters:  08/15/20 298 lb (135.2 kg)  07/08/20 288 lb (130.6 kg)  05/20/20 286 lb 8 oz (130 kg)   Clinical Medical Hx: History includes:  Type 2 diabetes (2010) with microabluminuria, CKD stage 3, glaucoma, Fabry's disease (manifests as lymphedema), CVA, HTN, CHF, pacemaker Medications: see list to include:  Novolog 70/30 Labs: A1C 7.8% (07/08/20) increased from 7.4% 01/2020, cholesterol 165, HDL 63, LDL 89, triglycerides 69 07/08/20  Lifestyle & Dietary Hx Patient lives alone.  He has started to cook his foods fresh more frequently.  He states that he bakes most of his meats.  He does his own shopping. He walks with a cane. He is retired due to his lymphedema.  He worked for [employer] and then [employer]. Tries to eat by 6:00 and no later than 7:30.  He occasionally will eat ice cream at night and verbalized a need to change that. He has stopped eating in front of the TV and eats in his kitchen. If he eats chips he will take a serving and put the bag away. He limits bread and juice. Avoids Bananas as his potassium has been elevated in the past. Avoids added salt.  Uses Mrs. Dash or hot sauce.  Estimated daily fluid intake: 64 oz Supplements: none Sleep: States that he sleep late as he is fearful that a tree will fall on his house (no more than 5 hours per night plus naps during the day). 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  He does some squats and other exercises including walking in his home.  24-Hr Dietary Recall First Meal: 2 strips thick slice bacon, 1 egg, 1 egg white, 2 slices honey wheat bread with 1 pat butter, melon or berries Snack: none Second Meal: cucumber and tomato salad with balsamic vinaigrette OR sandwich and chips OR chibatta bread Snack: none Third Meal: baked chicken, pork or bratwurst, rice, vegetables Snack: occasional ice cream Beverages: water, 2% milk, occasional juice, occasional regular lemonade   NUTRITION DIAGNOSIS  NB-1.1 Food and nutrition-related knowledge deficit As related to balance of carbohydrate, protein, and fat.  As evidenced by diet hx and patient report.   NUTRITION INTERVENTION  Nutrition education (E-1) on the following topics:  . Continued mindfulness (eating in the kitchen, serving single portions) . Benefits of increased activity as tolerated  . Label reading (carbs and sodium) . Tips to decrease sodium intake . Beverage intake and carbohydrates . Snack options  Handouts Provided Include   NKD national kidney diet for those not on dialysis  Meal plan card  Learning Style & Readiness for Change Teaching method utilized: Visual & Auditory  Demonstrated degree of understanding via: Teach Back  Barriers to learning/adherence to lifestyle change: lymphadema  Goals Continue to be mindful   Eating in the kitchen.  Serving single portions  Continue to stay active.  Aim for 30 minutes most days.  This can be split up.  Read the Nutrition Labels  Choose a low sodium diet  Continue to avoid added salt  Avoid processed meat to include bacon.  Choose low sodium [place]  Limit portion size of chips or avoid.  Low sodium pasta sauce (Silver Palate low sodium)  Rinse off any canned vegetables, buy low sodium or get frozen vegetables without salt or fresh  vegetables  A meal should be no more than about 600 mg sodium. (2000 mg per day or less)  Rethink what you drink.  Avoid beverages with carbohydrates  Raw veges with nonfat sour cream with Mrs Deliah Boston (or garlic and onion powder, dill)   MONITORING & EVALUATION Dietary intake, weekly physical activity, and label reading in 6 weeks.  Next Steps  Patient is to call or MyChart for questions.

## 2020-08-20 DIAGNOSIS — E7521 Fabry (-Anderson) disease: Secondary | ICD-10-CM | POA: Diagnosis not present

## 2020-08-28 ENCOUNTER — Other Ambulatory Visit: Payer: Self-pay | Admitting: Internal Medicine

## 2020-08-28 DIAGNOSIS — I10 Essential (primary) hypertension: Secondary | ICD-10-CM

## 2020-08-28 NOTE — Telephone Encounter (Signed)
Future visit in 2 months  

## 2020-09-03 DIAGNOSIS — E7521 Fabry (-Anderson) disease: Secondary | ICD-10-CM | POA: Diagnosis not present

## 2020-09-05 ENCOUNTER — Other Ambulatory Visit: Payer: Self-pay | Admitting: Internal Medicine

## 2020-09-05 DIAGNOSIS — R0981 Nasal congestion: Secondary | ICD-10-CM

## 2020-09-05 DIAGNOSIS — R3589 Other polyuria: Secondary | ICD-10-CM

## 2020-09-05 DIAGNOSIS — I10 Essential (primary) hypertension: Secondary | ICD-10-CM

## 2020-09-05 NOTE — Telephone Encounter (Signed)
Future visit in 2 months  

## 2020-09-08 ENCOUNTER — Encounter: Payer: Medicare HMO | Admitting: Internal Medicine

## 2020-09-17 DIAGNOSIS — E7521 Fabry (-Anderson) disease: Secondary | ICD-10-CM | POA: Diagnosis not present

## 2020-09-22 ENCOUNTER — Ambulatory Visit (INDEPENDENT_AMBULATORY_CARE_PROVIDER_SITE_OTHER): Payer: Medicare HMO

## 2020-09-22 DIAGNOSIS — I441 Atrioventricular block, second degree: Secondary | ICD-10-CM

## 2020-09-24 LAB — CUP PACEART REMOTE DEVICE CHECK
Battery Remaining Longevity: 95 mo
Battery Remaining Percentage: 95.5 %
Battery Voltage: 3.01 V
Brady Statistic AP VP Percent: 67 %
Brady Statistic AP VS Percent: 1 %
Brady Statistic AS VP Percent: 33 %
Brady Statistic AS VS Percent: 1 %
Brady Statistic RA Percent Paced: 66 %
Brady Statistic RV Percent Paced: 99 %
Date Time Interrogation Session: 20220418020012
Implantable Lead Implant Date: 20120907
Implantable Lead Implant Date: 20120907
Implantable Lead Location: 753859
Implantable Lead Location: 753860
Implantable Pulse Generator Implant Date: 20201215
Lead Channel Impedance Value: 360 Ohm
Lead Channel Impedance Value: 410 Ohm
Lead Channel Pacing Threshold Amplitude: 0.625 V
Lead Channel Pacing Threshold Amplitude: 1.75 V
Lead Channel Pacing Threshold Pulse Width: 0.5 ms
Lead Channel Pacing Threshold Pulse Width: 0.8 ms
Lead Channel Sensing Intrinsic Amplitude: 5 mV
Lead Channel Setting Pacing Amplitude: 1.625
Lead Channel Setting Pacing Amplitude: 2 V
Lead Channel Setting Pacing Pulse Width: 0.8 ms
Lead Channel Setting Sensing Sensitivity: 4 mV
Pulse Gen Model: 2272
Pulse Gen Serial Number: 9187005

## 2020-09-27 ENCOUNTER — Encounter: Payer: Self-pay | Admitting: Internal Medicine

## 2020-09-30 ENCOUNTER — Encounter: Payer: Self-pay | Admitting: Internal Medicine

## 2020-10-02 DIAGNOSIS — E7521 Fabry (-Anderson) disease: Secondary | ICD-10-CM | POA: Diagnosis not present

## 2020-10-07 NOTE — Progress Notes (Signed)
Remote pacemaker transmission.   

## 2020-10-10 ENCOUNTER — Encounter: Payer: Self-pay | Admitting: Dietician

## 2020-10-10 ENCOUNTER — Other Ambulatory Visit: Payer: Self-pay

## 2020-10-10 ENCOUNTER — Encounter: Payer: Medicare HMO | Attending: Internal Medicine | Admitting: Dietician

## 2020-10-10 DIAGNOSIS — N1832 Chronic kidney disease, stage 3b: Secondary | ICD-10-CM | POA: Diagnosis not present

## 2020-10-10 DIAGNOSIS — E1169 Type 2 diabetes mellitus with other specified complication: Secondary | ICD-10-CM | POA: Diagnosis not present

## 2020-10-10 DIAGNOSIS — E669 Obesity, unspecified: Secondary | ICD-10-CM | POA: Insufficient documentation

## 2020-10-10 NOTE — Progress Notes (Signed)
Medical Nutrition Therapy  Appointment Start time:  [phone]  Appointment End time:  1435  Primary concerns today:  Referral diagnosis: Type 2 Diabetes, obesity Preferred learning style: no preference indicated Learning readiness: ready   NUTRITION ASSESSMENT  Patient is here today alone.  He was last seen by this RD 08/15/2020.  He states that he has been using mayo on only 1 side of the bread.   He has been adding peppers to his meals frequently. He tried the almond milk for 2 weeks and avoided the sugar on the cereal but does not like this with a PB&J sandwich. Eating fewer eggs and bacon. Drinking more water and no regular soda or juice but is drinking sweet tea. He is now going to the gym and using the treadmill for 30 minutes twice per week. He uses resistance bands and other light exercise daily. Blood glucose 117 this am and frequently in the teens.  127 afternoon and evening.   Anthropometrics  Wt Readings from Last 3 Encounters:  10/10/20 292 lb (132.5 kg)  08/15/20 298 lb (135.2 kg)  07/08/20 288 lb (130.6 kg)   Clinical Medical Hx: History includes:  Type 2 diabetes (2010) with microabluminuria, CKD stage 3, glaucoma, Fabry's disease (manifests as lymphedema), CVA, HTN, CHF, pacemaker Medications: see list to include:  Novolog 70/30 24 units bid Labs: A1C 7.8% (07/08/20) increased from 7.4% 01/2020, cholesterol 165, HDL 63, LDL 89, triglycerides 69 07/08/20, GFR 42 (07/08/20)  Lifestyle & Dietary Hx Patient lives alone.  He has started to cook his foods fresh more frequently.  He states that he bakes most of his meats.  He does his own shopping. He walks with a cane. He is retired due to his lymphedema.  He worked for [employer] and then [employer]. Tries to eat by 6:00 and no later than 7:30.  He occasionally will eat ice cream at night and verbalized a need to change that. He has stopped eating in front of the TV and eats in his kitchen. If he eats chips he will  take a serving and put the bag away. He limits bread and juice. Avoids Bananas as his potassium has been elevated in the past. Avoids added salt.  Uses Mrs. Dash or hot sauce.  Estimated daily fluid intake: 64 oz Supplements: none Sleep: States that he sleep late as he is fearful that a tree will fall on his house (no more than 5 hours per night plus naps during the day).  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  He does some squats and other exercises including walking in his home.  24-Hr Dietary Recall First Meal: plain cheerios or cornflakes with almond milk, strawberries, 2 slices toast, 1 pat butter Snack: none Second Meal: skips OR tomato cucumber salad with [place] or eggs OR tuna with crackers Snack: none Third Meal: baked chicken, pork or wings, rice, vegetables Snack:  Beverages: water, almond milk, 2% milk, rare juice, 8 oz sweet tea daily   NUTRITION DIAGNOSIS  NB-1.1 Food and nutrition-related knowledge deficit As related to balance of carbohydrate, protein, and fat.  As evidenced by diet hx and patient report.   NUTRITION INTERVENTION  Nutrition education (E-1) on the following topics:  . Reviewed habits and provided encouragement for changes made . Benefits of exercise . Lower sodium . Small portions meat .  Eating increased vegetables (unless potassium becomes elevated)  Handouts Provided Include - 07/2020 visit  NKD national kidney diet for those not on dialysis  Meal plan card  Learning Style & Readiness for Change Teaching method utilized: Visual & Auditory  Demonstrated degree of understanding via: Teach Back  Barriers to learning/adherence to lifestyle change: lymphadema  Goals Continue to be consistent with the great changes that you have made!  Continue to stay active and go to the gym.  Continue to be  mindful about the food choices that you make.  Continue to choose beverages without sugar.  Decreasing fat intake.  Decreasing salt.  Read your food labels.  Avoid any foods that have phos..in the ingredient list.   MONITORING & EVALUATION Dietary intake, weekly physical activity, and label reading in 2-3 months.  Next Steps  Patient is to call or MyChart for questions.

## 2020-10-10 NOTE — Patient Instructions (Addendum)
Continue to be consistent with the great changes that you have made!  Continue to stay active and go to the gym.  Continue to be mindful about the food choices that you make.  Continue to choose beverages without sugar.  Decreasing fat intake.  Decreasing salt.  Read your food labels.  Avoid any foods that have phos..in the ingredient list.

## 2020-10-14 DIAGNOSIS — I639 Cerebral infarction, unspecified: Secondary | ICD-10-CM | POA: Diagnosis not present

## 2020-10-14 DIAGNOSIS — E118 Type 2 diabetes mellitus with unspecified complications: Secondary | ICD-10-CM | POA: Diagnosis not present

## 2020-10-14 DIAGNOSIS — I129 Hypertensive chronic kidney disease with stage 1 through stage 4 chronic kidney disease, or unspecified chronic kidney disease: Secondary | ICD-10-CM | POA: Diagnosis not present

## 2020-10-14 DIAGNOSIS — E7521 Fabry (-Anderson) disease: Secondary | ICD-10-CM | POA: Diagnosis not present

## 2020-10-14 DIAGNOSIS — I441 Atrioventricular block, second degree: Secondary | ICD-10-CM | POA: Diagnosis not present

## 2020-10-14 DIAGNOSIS — I890 Lymphedema, not elsewhere classified: Secondary | ICD-10-CM | POA: Diagnosis not present

## 2020-10-14 DIAGNOSIS — N1831 Chronic kidney disease, stage 3a: Secondary | ICD-10-CM | POA: Diagnosis not present

## 2020-10-14 DIAGNOSIS — M109 Gout, unspecified: Secondary | ICD-10-CM | POA: Diagnosis not present

## 2020-10-15 DIAGNOSIS — E7521 Fabry (-Anderson) disease: Secondary | ICD-10-CM | POA: Diagnosis not present

## 2020-10-16 ENCOUNTER — Other Ambulatory Visit: Payer: Self-pay

## 2020-10-16 ENCOUNTER — Ambulatory Visit: Payer: Medicare HMO | Attending: Internal Medicine | Admitting: Internal Medicine

## 2020-10-16 ENCOUNTER — Encounter: Payer: Self-pay | Admitting: Internal Medicine

## 2020-10-16 VITALS — BP 128/77 | HR 63 | Resp 16 | Ht 69.0 in | Wt 287.6 lb

## 2020-10-16 DIAGNOSIS — Z0000 Encounter for general adult medical examination without abnormal findings: Secondary | ICD-10-CM

## 2020-10-16 DIAGNOSIS — Z7189 Other specified counseling: Secondary | ICD-10-CM | POA: Diagnosis not present

## 2020-10-16 DIAGNOSIS — Z23 Encounter for immunization: Secondary | ICD-10-CM

## 2020-10-16 NOTE — Progress Notes (Signed)
Subjective:   [name] is a 60 y.o. male who presents for a Welcome to Medicare exam.  Patient with history of diabetes type 2 with microalbuminuria, diastolic CHF with pacemaker (secconary to AV block with syncopy), CKD stage III, HOCM, HTN, HL,CVA,lymphedema. 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   Review of Systems: HTN: not checking BP as often as he should       Objective:    Today's Vitals   10/16/20 1029 10/16/20 1125  BP: (!) 148/96 128/77  Pulse: 63   Resp: 16   SpO2: 99%   Weight: 287 lb 9.6 oz (130.5 kg)   Height: 5\' 9"  (1.753 m)    Body mass index is 42.47 kg/m.  Wt Readings from Last 3 Encounters:  10/16/20 287 lb 9.6 oz (130.5 kg)  10/10/20 292 lb (132.5 kg)  08/15/20 298 lb (135.2 kg)     Medications Outpatient Encounter Medications as of 10/16/2020  Medication Sig  . Accu-Chek FastClix Lancets MISC 1 each by Other route 3 (three) times daily.  [name] Kitchen acetaminophen (TYLENOL) 325 MG tablet Take 1-2 tablets (325-650 mg total) by mouth every 4 (four) hours as needed for mild pain.  . Agalsidase beta (FABRAZYME IV) Inject 115 mg into the vein every 14 (fourteen) days.   [name] Kitchen albuterol (PROAIR HFA) 108 (90 Base) MCG/ACT inhaler Inhale 1 puff into the lungs every 6 (six) hours as needed for wheezing or shortness of breath.  . allopurinol (ZYLOPRIM) 100 MG tablet TAKE 3 TABLETS BY MOUTH EVERY DAY  . amLODipine (NORVASC) 10 MG tablet TAKE 1 TABLET BY MOUTH EVERY DAY  . atorvastatin (LIPITOR) 80 MG tablet TAKE 1 TABLET (80 MG TOTAL) BY MOUTH DAILY AT 6 PM.  . clopidogrel (PLAVIX) 75 MG tablet TAKE 1 TABLET BY MOUTH EVERY DAY  . diclofenac Sodium (VOLTAREN) 1 % GEL Apply 4 g topically 4 (four) times daily. (Patient not taking: Reported on 10/16/2020)  . doxazosin (CARDURA) 2 MG tablet TAKE 0.5 TABLETS BY MOUTH DAILY.  . fexofenadine (ALLEGRA) 180 MG tablet Take 1 tablet (180 mg total) by mouth daily.  . fluticasone (FLONASE) 50  MCG/ACT nasal spray PLACE 1 SPRAY INTO BOTH NOSTRILS DAILY AS NEEDED FOR ALLERGIES.  . furosemide (LASIX) 20 MG tablet TAKE 1 TABLET BY MOUTH EVERY DAY  . gabapentin (NEURONTIN) 300 MG capsule Take 2 capsules (600 mg total) by mouth 3 (three) times daily. (Patient not taking: Reported on 10/16/2020)  . glucose blood (ACCU-CHEK AVIVA PLUS) test strip 1 each by Other route 3 (three) times daily. Use as instructed  . insulin aspart protamine - aspart (NOVOLOG MIX 70/30 FLEXPEN) (70-30) 100 UNIT/ML FlexPen Inject 0.28 mLs (28 Units total) into the skin 2 (two) times daily.  . Insulin Pen Needle (PX SHORTLENGTH PEN NEEDLES) 31G X 8 MM MISC 1 each by Does not apply route 2 (two) times daily before a meal.  . mupirocin ointment (BACTROBAN) 2 % Apply to affected area daily with dressing change.  . naproxen (NAPROSYN) 500 MG tablet TAKE 1 TABLET BY MOUTH TWICE A DAY AS NEEDED FOR PAIN  . OLIVE LEAF EXTRACT PO Take 1 capsule by mouth daily. (Patient not taking: No sig reported)  . traMADol (ULTRAM) 50 MG tablet Take 1 tablet (50 mg total) by mouth every 6 (six) hours as needed for severe pain. (Patient not taking: No sig reported)  . valsartan-hydrochlorothiazide (DIOVAN HCT) 80-12.5 MG tablet Take 1 tablet by mouth daily.  This is to replace Losartan/HCTZ   No facility-administered encounter medications on file as of 10/16/2020.     History: Past Medical History:  Diagnosis Date  . Bell's palsy   . Bifascicular block   . Chronic bronchitis ([place])    "seasonal; get it q yr"  . Chronic diastolic CHF (congestive heart failure) ([place]) 2010  . Chronic kidney disease (CKD), stage II (mild)    stage II to III/notes 07/23/2014  . Diastolic heart failure secondary to hypertrophic cardiomyopathy ([place]) CARDIOLOGIST-- DR Daneen Schick  . Dyspnea    increased exertion   . Edema 07/2013  . Fabry's disease ([place]) RENAL AND CARDIAC INVOVLEMENT   FOLLOWED DR [place]  . Gout, arthritis 2014   bil feet. right worse  .  History of cellulitis of skin with lymphangitis LEFT LEG  . HOCM (hypertrophic obstructive cardiomyopathy) ([place])    a. Echo 11/16: Severe LVH, EF 55-60%, abnormal GLS consistent with HOCM, no SAM, mild LAE  . Hypertension 20 years  . Lymphedema of lower extremity LEFT  >  RIGHT   "using ankle high socks at home; pump doesn't work for me" (07/23/2014)  . Pacemaker    02/12/11  . Pneumonia 08/2011  . Seasonal asthma NO INHALERS   30 years  . Second degree Mobitz II AV block    with syncope, s/p PPM  . Short of breath on exertion   . Type II diabetes mellitus ([place]) 2012   INSULIN DEPENDENT   Past Surgical History:  Procedure Laterality Date  . CARDIAC CATHETERIZATION  03-12-2011  DR Daneen Schick   HYPERTROPHIC CARDIOMYOPATHY WITH LV CAVITY  APPEARANCE CONSISTANT WITH SIGNIFICANT APICAL HYPERTROPHY/ NORMAL LVSF / EF 55%/ EVIDENCE OF DIASTOLIC DYSFUNCTION WITH EDP OF 23-26mmHg AFTER A-WAVE/ NORMAL CORONARY ARTERIES  . EVALUATION UNDER ANESTHESIA WITH FISTULECTOMY N/A 06/23/2015   Procedure: EXAM UNDER ANESTHESIA WITH POSSIBLE FISTULOTOMY;  Surgeon: Judeth Horn, MD;  Location: [facility];  Service: General;  Laterality: N/A;  . HERNIA REPAIR  [id number]   Umbilical Hernia Repair  . INCISION AND DRAINAGE PERIRECTAL ABSCESS Left 07/29/2014   Procedure: IRRIGATION AND DEBRIDEMENT PERIRECTAL ABSCESS;  Surgeon: Doreen Salvage, MD;  Location: [facility];  Service: General;  Laterality: Left;  Prone position  . INSERT / REPLACE / REMOVE PACEMAKER  02/12/2011   SJM implanted by Dr Rayann Heman for Mobitz II second degree AV block and syncope  . IRRIGATION AND DEBRIDEMENT ABSCESS N/A 07/27/2013   Procedure: IRRIGATION AND DEBRIDEMENT OF SKIN, SOFT TISSUE AND MUSCLES OF UPPER BACK (11X19X4cm) WITH 10 BLADE AND PULSATILE LAVAGE ;  Surgeon: Gayland Curry, MD;  Location: [facility];  Service: General;  Laterality: N/A;  . PPM GENERATOR CHANGEOUT N/A 05/22/2019   Procedure: PPM GENERATOR CHANGEOUT;  Surgeon: Thompson Grayer, MD;  Location: [facility];  Service: Cardiovascular;  Laterality: N/A;  . UMBILICAL HERNIA REPAIR  03/22/2012   Procedure: HERNIA REPAIR UMBILICAL ADULT;  Surgeon: Leighton Ruff, MD;  Location: [facility];  Service: General;  Laterality: N/A;  Umbilical Hernia Repair     Family History  Problem Relation Age of Onset  . Stroke Mother   . Fabry's disease Mother   . Fabry's disease Brother   . Colon cancer Neg Hx    Social History   Occupational History  . Not on file  Tobacco Use  . Smoking status: Never Smoker  . Smokeless tobacco: Never Used  Vaping Use  . Vaping Use: Never used  Substance and Sexual Activity  .  Alcohol use: Yes    Comment: 07/23/2014 "might have a few drinks on holidays or at cookouts"  . Drug use: No  . Sexual activity: Yes   Tobacco Counseling -does not smoke, drink or uses street drugs  Immunizations and Health Maintenance Immunization History  Administered Date(s) Administered  . Influenza Split 08/21/2011, 07/11/2012  . Influenza,inj,Quad PF,6+ Mos 04/21/2015, 05/12/2017, 05/29/2018, 05/23/2019, 07/08/2020  . PFIZER(Purple Top)SARS-COV-2 Vaccination 09/13/2019, 10/08/2019, 07/11/2020  . Pneumococcal Polysaccharide-23 08/21/2011, 05/23/2019  . Zoster Recombinat (Shingrix) 10/16/2020    Activities of Daily Living In your present state of health, do you have any difficulty performing the following activities: 10/16/2020  Hearing? N  Vision? N  Difficulty concentrating or making decisions? N  Walking or climbing stairs? N  Dressing or bathing? N  Doing errands, shopping? N  Preparing Food and eating ? N  Using the Toilet? N  In the past six months, have you accidently leaked urine? N  Do you have problems with loss of bowel control? N  Managing your Medications? N  Managing your Finances? N  Housekeeping or managing your Housekeeping? N  Some recent data might be hidden  He has hearing aids for both ears but not wearing them today  Physical Exam  General:  Older obese African-American male in NAD. Ears: Both ear canal and tympanic membrane within normal limits. Neck: No cervical or axillary lymphadenopathy.  No thyroid enlargement. Chest: Clear to auscultation bilaterally CVS: Regular rate rhythm no gallops or murmurs. Extremities: Both lower legs from below the knees down are wrapped with Velcro wraps  Advanced Directives: Does Patient Have a Medical Advance Directive?: No Does patient want to make changes to medical advance directive?: No - Patient declined Would patient like information on creating a medical advance directive?: No - Patient declined    I double check with him about the advanced directive.  He was not sure what it was and I explained to him what an advanced directive is and the components of an advanced directive which can be a living will and or healthcare power of attorney.  Patient states that he has had discussions with his sister and she knows what his wishes would be.  He states that he has started writing stuff down but does not have anything notarized.  I encouraged him to make it official by having it notarized.  I also told him of the packet that we have that I can give to him to take home.  Patient declined that but stated that there are few other things that he needs to add to the document that he is started writing up himself and then will consider getting it notarized.    Assessment:    This is a routine wellness  examination for this patient .   Vision/Hearing screen Whisper test was normal on both sides  Hearing Screening   125Hz  250Hz  500Hz  1000Hz  2000Hz  3000Hz  4000Hz  6000Hz  8000Hz   Right ear:           Left ear:             Visual Acuity Screening   Right eye Left eye Both eyes  Without correction: 20 50 20 50 20 50  With correction:       Dietary issues and exercise activities discussed:  -seeing a nutritionist. Plate more colorful.  He stopped eating while watching TV -snacks on fruits  Goals     . Blood Pressure < 140/90    . HEMOGLOBIN A1C < 7.0    .  Weight (lb) < 200 lb (90.7 kg)     Patient wants to get down to 180 pounds so that he would look better and is close.      Depression Screen PHQ 2/9 Scores 10/16/2020 08/15/2020 07/08/2020 01/24/2020  PHQ - 2 Score 0 0 0 3  PHQ- 9 Score - - - 3     Fall Risk Fall Risk  10/16/2020  Falls in the past year? 0  Number falls in past yr: 0  Injury with Fall? 0  Risk for fall due to : No Fall Risks    Cognitive Function MMSE - Mini Mental State Exam 10/16/2020  Orientation to time 5  Orientation to Place 5  Registration 3  Attention/ Calculation 5  Recall 3  Language- name 2 objects 2  Language- repeat 1  Language- follow 3 step command 3  Language- read & follow direction 1  Write a sentence 1  Copy design 0  Total score 29        Patient Care Team: Ladell Pier, MD as PCP - General (Internal Medicine) Belva Crome, MD as Consulting Physician (Cardiology) Thompson Grayer, MD as Consulting Physician ([facility]) Donato Heinz, MD as Consulting Physician (Nephrology)     Plan:    1. Encounter for Medicare annual wellness exam -Encourage patient to continue healthy eating habits as directed by the nutritionist in order to help obtain his weight goal.  Try to move as much as he can.  2. Morbid obesity ([place]) See #1 above  3. Advance directive discussed with patient See discussion above regarding advanced directive.  4. Need for shingles vaccine Pt agrees to receive 1st shot of Shingrix.  Will plan to give 2nd shot on subsequent visit - Varicella-zoster vaccine IM (Shingrix)  I have personally reviewed and noted the following in the patient's chart:   . Medical and social history . Use of alcohol, tobacco or illicit drugs  . Current medications and supplements . Functional ability and status . Nutritional status . Physical activity . Advanced directives . List of other  physicians . Hospitalizations, surgeries, and ER visits in previous 12 months . Vitals . Screenings to include cognitive, depression, and falls . Referrals and appointments  In addition, I have reviewed and discussed with patient certain preventive protocols, quality metrics, and best practice recommendations. A written personalized care plan for preventive services as well as general preventive health recommendations were provided to patient.    Karle Plumber, MD 10/16/2020

## 2020-10-17 ENCOUNTER — Encounter: Payer: Self-pay | Admitting: Internal Medicine

## 2020-10-29 DIAGNOSIS — E7521 Fabry (-Anderson) disease: Secondary | ICD-10-CM | POA: Diagnosis not present

## 2020-11-06 ENCOUNTER — Encounter: Payer: Self-pay | Admitting: Neurology

## 2020-11-06 ENCOUNTER — Ambulatory Visit (INDEPENDENT_AMBULATORY_CARE_PROVIDER_SITE_OTHER): Payer: Medicare HMO | Admitting: Neurology

## 2020-11-06 ENCOUNTER — Other Ambulatory Visit: Payer: Self-pay

## 2020-11-06 VITALS — BP 127/72 | HR 64 | Ht 70.0 in | Wt 288.0 lb

## 2020-11-06 DIAGNOSIS — G463 Brain stem stroke syndrome: Secondary | ICD-10-CM | POA: Diagnosis not present

## 2020-11-06 DIAGNOSIS — E7521 Fabry (-Anderson) disease: Secondary | ICD-10-CM

## 2020-11-06 NOTE — Patient Instructions (Signed)
Continue current medications Aggressive management of vascular risk factors  Continue to see your primary care doctor See you back 1 year

## 2020-11-06 NOTE — Progress Notes (Signed)
[facility] [address] [place]. [place] [address] [phone]  CHIEF COMPLAINT:  Chief Complaint  Patient presents with  . Follow-up    New rm, alone, pt states he is stable     HPI: [name] is a 60 year old male,  he has history of Fabry's disease, hypertension, insulin-dependent diabetes, obstructive sleep apnea, Mobitz 2, status post pacemaker placement in September 2012, chronic lympha edema of bilateral lower extremity, gout.  His mother and one younger brother was diagnosed with Fabry's disease around 2010, at that time, he presented with progressive worsening bilateral lower extremity lympha edema, he was referred to [facility] for genetic testing, which confirmed a diagnosis of Fabry disease.  He has been treated with Fabrazyme IV infusion every 2 weeks at [facility] since then.  His brother did suffered painful bilateral lower extremity peripheral neuropathy, he denied significant pain, since he started Fabrazyme IV infusion, his bilateral lower extremity lymphedema has been fairly stable, his kidney function fluctuate, but overall creatinine was stabilized around 1.6 in 27-May-2017,  His mother died of stroke at age 29, brother died of stroke at age 47,   He is taking aspirin 81 mg, not MRI candidate due to pacemaker, denies history of strokelike symptoms, he does exercise couple times each week.  Reported previous sleep study showed mild abnormality, but not using CPAP machine, he is active during the day, tends to nod off to sleep after sitting for a while, deny snoring, choking episode.  Echocardiogram on August 25, 2017 showed ejection fraction 55 to 60%, wall motion was normal  [place] of carotid artery showed less than 39% stenosis bilaterally.  He was followed by stroke team since May 2020, when he presented on Oct 19, 2018 with headache, left-sided paresthesia, pain behind his eyes, suggested possible lacunar infarction involving brainstem  due to small vessel disease, though it is not reflected on the CAT scan,  I personally reviewed CT head without contrast on June 05, 2019: No acute abnormality, chronic lacunar infarction involving pons, microvascular changes in hemisphere,  TCD showed diffuse intracranial atherosclerosis.  Carotid Doppler and 2D echo unremarkable.  HIV negative.  UDS negative.  LDL 161 and A1c 7.4.  Recommended DAPT for 3 weeks and Plavix alone.    He is now receiving his Fabrazyme infusion through [facility].  Today he complains 2 weeks history of new onset left-sided low back pain, woke up 1 night using bathroom, as if somebody shot behind me, complains of left low back pain, radiating pain to left lower extremity  I personally reviewed x-ray in 05/27/2020: X-ray of left hip, degenerative changes, no acute abnormality  X-ray of lumbar spine diffuse multilevel degenerative changes, 5 mm anterolisthesis of L4 on L5, no acute abnormality  Update November 06, 2020 SS: Doing well, nothing new to report. Legs wrapped for lymphedema. Remains on Fabrazyme via [facility] every 2 weeks on [facility].  CT lumbar spine in January 2022 showed anterolisthesis of L4 on L5 with some lateral recess stenosis bilaterally, may be putting the L4 nerve root at risk.  Doing better on gabapentin. Saw orthopedics Dr. Lorin Mercy. Off gabapentin now. The episode of pain lasted about 1 week, then went away.   HTN: goal < 130/90, on Norvasc, Cardura, Lasix, Valsartan-HCTZ HLD: LDL goal < 70 on Lipitor, LDL 89 Feb 2022 DM: A1C goal < 7.0, on insulin, was 7.8 in Feb 2022, seeing nutritionist, really cut back on salt intake, more fresh fruits/vegetable, carbs, water intake  See PCP tomorrow, lab recheck, expect #'s to be better with lifestyle changes. CMP showed creatinine 1.96 Feb 2022 stable; is losing weight, but is slow process. Works out at home, worries about being out in [employer]  He lives alone, drives a car. No falls  recently, using cane. In fact feels like getting around better. Sister is his main support.  ROS:   See HPI   PMH:  Past Medical History:  Diagnosis Date  . Bell's palsy   . Bifascicular block   . Chronic bronchitis ([place])    "seasonal; get it q yr"  . Chronic diastolic CHF (congestive heart failure) ([place]) 2010  . Chronic kidney disease (CKD), stage II (mild)    stage II to III/notes 07/23/2014  . Diastolic heart failure secondary to hypertrophic cardiomyopathy ([place]) CARDIOLOGIST-- DR Daneen Schick  . Dyspnea    increased exertion   . Edema 07/2013  . Fabry's disease ([place]) RENAL AND CARDIAC INVOVLEMENT   FOLLOWED DR [place]  . Gout, arthritis 2014   bil feet. right worse  . History of cellulitis of skin with lymphangitis LEFT LEG  . HOCM (hypertrophic obstructive cardiomyopathy) ([place])    a. Echo 11/16: Severe LVH, EF 55-60%, abnormal GLS consistent with HOCM, no SAM, mild LAE  . Hypertension 20 years  . Lymphedema of lower extremity LEFT  >  RIGHT   "using ankle high socks at home; pump doesn't work for me" (07/23/2014)  . Pacemaker    02/12/11  . Pneumonia 08/2011  . Seasonal asthma NO INHALERS   30 years  . Second degree Mobitz II AV block    with syncope, s/p PPM  . Short of breath on exertion   . Type II diabetes mellitus ([place]) 2012   INSULIN DEPENDENT    PSH:  Past Surgical History:  Procedure Laterality Date  . CARDIAC CATHETERIZATION  03-12-2011  DR Daneen Schick   HYPERTROPHIC CARDIOMYOPATHY WITH LV CAVITY  APPEARANCE CONSISTANT WITH SIGNIFICANT APICAL HYPERTROPHY/ NORMAL LVSF / EF 55%/ EVIDENCE OF DIASTOLIC DYSFUNCTION WITH EDP OF 23-30mmHg AFTER A-WAVE/ NORMAL CORONARY ARTERIES  . EVALUATION UNDER ANESTHESIA WITH FISTULECTOMY N/A 06/23/2015   Procedure: EXAM UNDER ANESTHESIA WITH POSSIBLE FISTULOTOMY;  Surgeon: Judeth Horn, MD;  Location: [facility];  Service: General;  Laterality: N/A;  . HERNIA REPAIR  0300   Umbilical Hernia Repair  . INCISION AND DRAINAGE PERIRECTAL  ABSCESS Left 07/29/2014   Procedure: IRRIGATION AND DEBRIDEMENT PERIRECTAL ABSCESS;  Surgeon: Doreen Salvage, MD;  Location: [facility];  Service: General;  Laterality: Left;  Prone position  . INSERT / REPLACE / REMOVE PACEMAKER  02/12/2011   SJM implanted by Dr Rayann Heman for Mobitz II second degree AV block and syncope  . IRRIGATION AND DEBRIDEMENT ABSCESS N/A 07/27/2013   Procedure: IRRIGATION AND DEBRIDEMENT OF SKIN, SOFT TISSUE AND MUSCLES OF UPPER BACK (11X19X4cm) WITH 10 BLADE AND PULSATILE LAVAGE ;  Surgeon: Gayland Curry, MD;  Location: [facility];  Service: General;  Laterality: N/A;  . PPM GENERATOR CHANGEOUT N/A 05/22/2019   Procedure: PPM GENERATOR CHANGEOUT;  Surgeon: Thompson Grayer, MD;  Location: [facility];  Service: Cardiovascular;  Laterality: N/A;  . UMBILICAL HERNIA REPAIR  03/22/2012   Procedure: HERNIA REPAIR UMBILICAL ADULT;  Surgeon: Leighton Ruff, MD;  Location: [facility];  Service: General;  Laterality: N/A;  Umbilical Hernia Repair     Social History:  Social History   Socioeconomic History  . Marital status: Divorced    Spouse name: Not on file  .  Number of children: Not on file  . Years of education: Not on file  . Highest education level: Not on file  Occupational History  . Not on file  Tobacco Use  . Smoking status: Never Smoker  . Smokeless tobacco: Never Used  Vaping Use  . Vaping Use: Never used  Substance and Sexual Activity  . Alcohol use: Yes    Comment: 07/23/2014 "might have a few drinks on holidays or at cookouts"  . Drug use: No  . Sexual activity: Yes  Other Topics Concern  . Not on file  Social History Narrative   [employer]    Social Determinants of Health   Financial Resource Strain: Not on file  Food Insecurity: Not on file  Transportation Needs: Not on file  Physical Activity: Not on file  Stress: Not on file  Social Connections: Not on file  Intimate Partner Violence: Not on file    Family History:  Family History   Problem Relation Age of Onset  . Stroke Mother   . Fabry's disease Mother   . Fabry's disease Brother   . Colon cancer Neg Hx     Medications:   Current Outpatient Medications on File Prior to Visit  Medication Sig Dispense Refill  . Accu-Chek FastClix Lancets MISC 1 each by Other route 3 (three) times daily. 102 each 12  . acetaminophen (TYLENOL) 325 MG tablet Take 1-2 tablets (325-650 mg total) by mouth every 4 (four) hours as needed for mild pain.    . Agalsidase beta (FABRAZYME IV) Inject 115 mg into the vein every 14 (fourteen) days.     [name] Kitchen albuterol (PROAIR HFA) 108 (90 Base) MCG/ACT inhaler Inhale 1 puff into the lungs every 6 (six) hours as needed for wheezing or shortness of breath. 8.5 Inhaler 1  . allopurinol (ZYLOPRIM) 100 MG tablet TAKE 3 TABLETS BY MOUTH EVERY DAY 270 tablet 0  . amLODipine (NORVASC) 10 MG tablet TAKE 1 TABLET BY MOUTH EVERY DAY 90 tablet 0  . atorvastatin (LIPITOR) 80 MG tablet TAKE 1 TABLET (80 MG TOTAL) BY MOUTH DAILY AT 6 PM. 90 tablet 1  . clopidogrel (PLAVIX) 75 MG tablet TAKE 1 TABLET BY MOUTH EVERY DAY 90 tablet 2  . doxazosin (CARDURA) 2 MG tablet TAKE 0.5 TABLETS BY MOUTH DAILY. 45 tablet 0  . fexofenadine (ALLEGRA) 180 MG tablet Take 1 tablet (180 mg total) by mouth daily. 30 tablet 3  . fluticasone (FLONASE) 50 MCG/ACT nasal spray PLACE 1 SPRAY INTO BOTH NOSTRILS DAILY AS NEEDED FOR ALLERGIES. 48 mL 1  . furosemide (LASIX) 20 MG tablet TAKE 1 TABLET BY MOUTH EVERY DAY 90 tablet 0  . glucose blood (ACCU-CHEK AVIVA PLUS) test strip 1 each by Other route 3 (three) times daily. Use as instructed 100 each 12  . insulin aspart protamine - aspart (NOVOLOG MIX 70/30 FLEXPEN) (70-30) 100 UNIT/ML FlexPen Inject 0.28 mLs (28 Units total) into the skin 2 (two) times daily. 15 mL 11  . Insulin Pen Needle (PX SHORTLENGTH PEN NEEDLES) 31G X 8 MM MISC 1 each by Does not apply route 2 (two) times daily before a meal. 100 each 8  . mupirocin ointment (BACTROBAN) 2 %  Apply to affected area daily with dressing change. 30 g 0  . valsartan-hydrochlorothiazide (DIOVAN HCT) 80-12.5 MG tablet Take 1 tablet by mouth daily. This is to replace Losartan/HCTZ 30 tablet 6   No current facility-administered medications on file prior to visit.    Allergies:  Allergies  Allergen Reactions  . Lisinopril Anaphylaxis  . Shellfish Allergy Hives and Swelling     Physical Exam  Today's Vitals   11/06/20 0928  BP: 127/72  Pulse: 64  Weight: 288 lb (130.6 kg)  Height: 5\' 10"  (1.778 m)   Body mass index is 41.32 kg/m.  PHYSICAL EXAMNIATION:  Physical Exam  General: The patient is alert and cooperative at the time of the examination.  Very pleasant, interactive, knowledgeable.  Skin: Bilateral lower extremities wrapped for significant lymphedema  Neurologic Exam  Mental status: The patient is alert and oriented x 3 at the time of the examination. The patient has apparent normal recent and remote memory, with an apparently normal attention span and concentration ability.  Cranial nerves: Facial symmetry is present. Speech is normal, no aphasia or dysarthria is noted. Extraocular movements are full. Visual fields are full.  Motor: The patient has good strength in all 4 extremities.  Sensory examination: Soft touch sensation is symmetric on the face, arms, and legs.  Coordination: The patient has good finger-nose-finger and heel-to-shin bilaterally.  Gait and station: Gait is wide-based, uses single-point cane, steady  Reflexes: Deep tendon reflexes are symmetric, somewhat decreased throughout  ASSESSMENT/PLAN: [name] is a 60 y.o. year old male here with likely brainstem lacunar infarct secondary to small vessel disease too small to be seen on imaging on 10/19/2018. Vascular risk factors include HTN, HLD, DM, Fabry's disease, diastolic heart failure, and prior history of stroke on imaging.    1. Fabry disease -Doing well overall  stable  -Continue enzyme infusion through [facility]  2. Possible small vessel stroke in May 2020 -Continue clopidogrel 75 mg daily  and atorvastatin 80 mg for secondary stroke prevention.   -Continue close follow-up with PCP for secondary stroke prevention, aggressive stroke risk factor management    HTN: BP goal <130/90.  At goal.  Remains on Norvasc, Cardura, Lasix, valsartan-HCTZ  HLD: LDL goal <70.  Remains on atorvastatin 80 mg daily   DMII: A1c goal<7.0.  On insulin, seeing nutritionist, has made diet changes, seeing PCP tomorrow anticipate labs will be improved with lifestyle modifications  3. New onset of left low back pain, radiating pain to left lower extremity -Resolved, off gabapentin, saw orthopedics -CT lumbar spine in January 2022 showed anterolisthesis of L4 on L5 with some lateral recess stenosis bilaterally, may be putting the L4 nerve root at risk  I spent 32 minutes of face-to-face and non-face-to-face time with patient.  This included previsit chart review, lab review, study review, discussing importance of secondary stroke prevention, management of vascular risk factors, providing encouragement to continue progress with healthy lifestyle changes, discussing follow-up.   Evangeline Dakin, DNP  [facility] [address] [place], [place] [address] [phone]

## 2020-11-07 ENCOUNTER — Encounter: Payer: Self-pay | Admitting: Internal Medicine

## 2020-11-07 ENCOUNTER — Other Ambulatory Visit: Payer: Self-pay

## 2020-11-07 ENCOUNTER — Ambulatory Visit: Payer: Medicare HMO | Attending: Internal Medicine | Admitting: Internal Medicine

## 2020-11-07 VITALS — BP 130/81 | HR 60 | Resp 16 | Wt 284.6 lb

## 2020-11-07 DIAGNOSIS — I129 Hypertensive chronic kidney disease with stage 1 through stage 4 chronic kidney disease, or unspecified chronic kidney disease: Secondary | ICD-10-CM

## 2020-11-07 DIAGNOSIS — I5032 Chronic diastolic (congestive) heart failure: Secondary | ICD-10-CM

## 2020-11-07 DIAGNOSIS — Z794 Long term (current) use of insulin: Secondary | ICD-10-CM | POA: Diagnosis not present

## 2020-11-07 DIAGNOSIS — E1122 Type 2 diabetes mellitus with diabetic chronic kidney disease: Secondary | ICD-10-CM | POA: Diagnosis not present

## 2020-11-07 DIAGNOSIS — E1159 Type 2 diabetes mellitus with other circulatory complications: Secondary | ICD-10-CM | POA: Diagnosis not present

## 2020-11-07 DIAGNOSIS — N1831 Chronic kidney disease, stage 3a: Secondary | ICD-10-CM | POA: Diagnosis not present

## 2020-11-07 DIAGNOSIS — E119 Type 2 diabetes mellitus without complications: Secondary | ICD-10-CM | POA: Insufficient documentation

## 2020-11-07 LAB — POCT GLYCOSYLATED HEMOGLOBIN (HGB A1C): HbA1c, POC (controlled diabetic range): 7.7 % — AB (ref 0.0–7.0)

## 2020-11-07 LAB — GLUCOSE, POCT (MANUAL RESULT ENTRY): POC Glucose: 81 mg/dl (ref 70–99)

## 2020-11-07 MED ORDER — NOVOLOG MIX 70/30 FLEXPEN (70-30) 100 UNIT/ML ~~LOC~~ SUPN: 24.0000 [IU] | PEN_INJECTOR | Freq: Two times a day (BID) | SUBCUTANEOUS | 11 refills | Status: DC

## 2020-11-07 NOTE — Progress Notes (Signed)
[name], male    DOB: 1960/07/05  MRN: [id number]  CC: No chief complaint on file.   Subjective: [name] is a 60 y.o. male who presents for chronic ds management His concerns today include:  Patient with history of diabetes type 2 with microalbuminuria, diastolic CHF with pacemaker (secconary to AV block with syncopy), CKD stage III, HOCM, HTN, HL,CVA,lymphedema. 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  Pt was upset for wait time of 45 mins.  I apologized to him for the wait time and thanked him for waiting.  Informed that he did have an overbook with the 9:10 a.m slot.  DIABETES TYPE 2 Last A1C:   Results for orders placed or performed in visit on 11/07/20  POCT glucose (manual entry)  Result Value Ref Range   POC Glucose 81 70 - 99 mg/dl  POCT glycosylated hemoglobin (Hb A1C)  Result Value Ref Range   Hemoglobin A1C     HbA1c POC (<> result, manual entry)     HbA1c, POC (prediabetic range)     HbA1c, POC (controlled diabetic range) 7.7 (A) 0.0 - 7.0 %    Med Adherence:  [x]  Yes  On Novolog 70/30 mix 24 units BID Medication side effects:  []  Yes    [x]  No Home Monitoring?  [x]  Yes 2-3 x a day. Home glucose results range: Before BF 96-100s, before lunch around 120s, before dinner 125-130. Diet Adherence: [x]  Yes  -snack on fruits.  Cut out eating chips and stopped eating in front of TV.  Lost 3 pounds since last visit. Exercise: []  Yes    [x]  No - not as much as he would like.  Afraid to go to gym because of [place].  Works in his yard. Hypoglycemic episodes?: [x]  Yes if he takes his evening dose of insulin later than 6 p.m he has hypoglycemia in the mornings..  Had only one episode since last visit Numbness of the feet? []  Yes    []  No Retinopathy hx? []  Yes    []  No Last eye exam:   Leg edema: No ulcers on legs.  Continues to wear his compression socks and Velcro wraps  HL: Taking and tolerating Lipitor  HTN/CHF:  Compliant with medications including amlodipine, furosemide, Diovan/HCTZ.  He has used other spices instead of salt.  No chest pains.  No shortness of breath.  CKD 3:  Saw Dr. Marval Regal 08/2020 reports kidney function is stable.  He is not on any NSAIDs.  Continues to get Fabrazyme infusions twice a week. Patient Active Problem List   Diagnosis Date Noted  . Influenza vaccine needed 07/08/2020  . Spinal stenosis of lumbar region without neurogenic claudication 07/08/2020  . Degenerative spondylolisthesis 05/20/2020  . Acute left-sided low back pain with left-sided sciatica 05/08/2020  . Reactive airway disease 01/24/2020  . Pain due to onychomycosis of toenails of both feet 10/17/2019  . Morbid obesity ([place]) 08/23/2019  . 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 CVA (cerebral vascular accident) ([place]) 10/20/2018  . UTI (urinary tract infection) 10/19/2018  . Upper airway cough syndrome 08/01/2018  . Lymphedema 10/06/2016  . Gout, arthritis   . 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 Onychomycosis of toenail 09/26/2014  . Diabetes mellitus type 2 in obese ([place])   . Iron deficiency anemia 07/29/2014  . CKD (chronic kidney disease), stage III ([place])   . Chronic diastolic HF (heart failure) ([place]) 03/27/2013  . Peripheral edema 07/20/2012  .  Obstructive sleep apnea, suspected 06/21/2012  . Pacemaker-St.Jude 02/23/2012  . Hypertension 08/09/2011  . Mobitz type II atrioventricular block 05/19/2011  . 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[place]) 05/19/2011     Current Outpatient Medications on File Prior to Visit  Medication Sig Dispense Refill  . Accu-Chek FastClix Lancets MISC 1 each by Other route 3 (three) times daily. 102 each 12  . acetaminophen (TYLENOL) 325 MG tablet Take 1-2 tablets (325-650 mg total) by mouth every 4 (four) hours as needed for mild pain.    . Agalsidase beta (FABRAZYME IV) Inject 115 mg into the vein every 14 (fourteen) days.     [name] Kitchen albuterol (PROAIR HFA) 108 (90 Base) MCG/ACT inhaler Inhale 1  puff into the lungs every 6 (six) hours as needed for wheezing or shortness of breath. 8.5 Inhaler 1  . allopurinol (ZYLOPRIM) 100 MG tablet TAKE 3 TABLETS BY MOUTH EVERY DAY 270 tablet 0  . amLODipine (NORVASC) 10 MG tablet TAKE 1 TABLET BY MOUTH EVERY DAY 90 tablet 0  . atorvastatin (LIPITOR) 80 MG tablet TAKE 1 TABLET (80 MG TOTAL) BY MOUTH DAILY AT 6 PM. 90 tablet 1  . clopidogrel (PLAVIX) 75 MG tablet TAKE 1 TABLET BY MOUTH EVERY DAY 90 tablet 2  . doxazosin (CARDURA) 2 MG tablet TAKE 0.5 TABLETS BY MOUTH DAILY. 45 tablet 0  . fexofenadine (ALLEGRA) 180 MG tablet Take 1 tablet (180 mg total) by mouth daily. 30 tablet 3  . fluticasone (FLONASE) 50 MCG/ACT nasal spray PLACE 1 SPRAY INTO BOTH NOSTRILS DAILY AS NEEDED FOR ALLERGIES. 48 mL 1  . furosemide (LASIX) 20 MG tablet TAKE 1 TABLET BY MOUTH EVERY DAY 90 tablet 0  . glucose blood (ACCU-CHEK AVIVA PLUS) test strip 1 each by Other route 3 (three) times daily. Use as instructed 100 each 12  . Insulin Pen Needle (PX SHORTLENGTH PEN NEEDLES) 31G X 8 MM MISC 1 each by Does not apply route 2 (two) times daily before a meal. 100 each 8  . mupirocin ointment (BACTROBAN) 2 % Apply to affected area daily with dressing change. 30 g 0  . valsartan-hydrochlorothiazide (DIOVAN HCT) 80-12.5 MG tablet Take 1 tablet by mouth daily. This is to replace Losartan/HCTZ 30 tablet 6   No current facility-administered medications on file prior to visit.    Allergies  Allergen Reactions  . Lisinopril Anaphylaxis  . Shellfish Allergy Hives and Swelling    Social History   Socioeconomic History  . Marital status: Divorced    Spouse name: Not on file  . Number of children: Not on file  . Years of education: Not on file  . Highest education level: Not on file  Occupational History  . Not on file  Tobacco Use  . Smoking status: Never Smoker  . Smokeless tobacco: Never Used  Vaping Use  . Vaping Use: Never used  Substance and Sexual Activity  . Alcohol  use: Yes    Comment: 07/23/2014 "might have a few drinks on holidays or at cookouts"  . Drug use: No  . Sexual activity: Yes  Other Topics Concern  . 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 Stroke Mother   . Fabry's disease Mother   . Fabry's disease Brother   . Colon cancer Neg Hx     Past Surgical History:  Procedure Laterality Date  . CARDIAC CATHETERIZATION  03-12-2011  DR Daneen Schick   HYPERTROPHIC CARDIOMYOPATHY WITH LV CAVITY  APPEARANCE CONSISTANT WITH SIGNIFICANT APICAL HYPERTROPHY/ NORMAL LVSF / EF 55%/ EVIDENCE OF DIASTOLIC DYSFUNCTION WITH EDP OF 23-52mmHg AFTER A-WAVE/ NORMAL CORONARY ARTERIES  . EVALUATION UNDER ANESTHESIA WITH FISTULECTOMY N/A 06/23/2015   Procedure: EXAM UNDER ANESTHESIA WITH POSSIBLE FISTULOTOMY;  Surgeon: Judeth Horn, MD;  Location: [place];  Service: General;  Laterality: N/A;  . HERNIA REPAIR  1093   Umbilical Hernia Repair  . INCISION AND DRAINAGE PERIRECTAL ABSCESS Left 07/29/2014   Procedure: IRRIGATION AND DEBRIDEMENT PERIRECTAL ABSCESS;  Surgeon: Doreen Salvage, MD;  Location: [place];  Service: General;  Laterality: Left;  Prone position  . INSERT / REPLACE / REMOVE PACEMAKER  02/12/2011   SJM implanted by Dr Rayann Heman for Mobitz II second degree AV block and syncope  . IRRIGATION AND DEBRIDEMENT ABSCESS N/A 07/27/2013   Procedure: IRRIGATION AND DEBRIDEMENT OF SKIN, SOFT TISSUE AND MUSCLES OF UPPER BACK (11X19X4cm) WITH 10 BLADE AND PULSATILE LAVAGE ;  Surgeon: Gayland Curry, MD;  Location: [place];  Service: General;  Laterality: N/A;  . PPM GENERATOR CHANGEOUT N/A 05/22/2019   Procedure: PPM GENERATOR CHANGEOUT;   Surgeon: Thompson Grayer, MD;  Location: [facility];  Service: Cardiovascular;  Laterality: N/A;  . UMBILICAL HERNIA REPAIR  03/22/2012   Procedure: HERNIA REPAIR UMBILICAL ADULT;  Surgeon: Leighton Ruff, MD;  Location: [facility];  Service: General;  Laterality: N/A;  Umbilical Hernia Repair     ROS: Review of Systems Negative except as stated above  PHYSICAL EXAM: BP 130/81   Pulse 60   Resp 16   Wt 284 lb 9.6 oz (129.1 kg)   SpO2 95%   BMI 40.84 kg/m   Wt Readings from Last 3 Encounters:  11/07/20 284 lb 9.6 oz (129.1 kg)  11/06/20 288 lb (130.6 kg)  10/16/20 287 lb 9.6 oz (130.5 kg)    Physical Exam  General appearance - alert, well appearing, and in no distress Mental status - normal mood, behavior, speech, dress, motor activity, and thought processes Neck - supple, no significant adenopathy Chest - clear to auscultation, no wheezes, rales or rhonchi, symmetric air entry Heart - normal rate, regular rhythm, normal S1, S2, no murmurs, rubs, clicks or gallops Extremities -he is wearing his compression socks and has both legs wrapped with Velcro wrap.  I did not remove this.  He ambulates with a cane.  CMP Latest Ref Rng & Units 07/08/2020 08/23/2019 05/22/2019  Glucose 65 - 99 mg/dL 163(H) 147(H) 133(H)  BUN 6 - 24 mg/dL 23 32(H) 36(H)  Creatinine 0.76 - 1.27 mg/dL 1.96(H) 2.02(H) 2.00(H)  Sodium 134 - 144 mmol/L 143 143 142  Potassium 3.5 - 5.2 mmol/L 4.6 4.6 4.0  Chloride 96 - 106 mmol/L 103 105 107  CO2 20 - 29 mmol/L 24 21 -  Calcium 8.7 - 10.2 mg/dL 10.0 9.8 -  Total Protein 6.0 - 8.5 g/dL 7.8 7.5 -  Total Bilirubin 0.0 - 1.2 mg/dL 0.8 0.4 -  Alkaline Phos 44 - 121 IU/L 119 126(H) -  AST 0 - 40 IU/L 35 41(H) -  ALT 0 - 44 IU/L 38 43 -   Lipid Panel     Component Value  Date/Time   CHOL 165 07/08/2020 1048   TRIG 69 07/08/2020 1048   HDL 63 07/08/2020 1048   CHOLHDL 2.6 07/08/2020 1048   CHOLHDL 4.4 10/20/2018 0324   VLDL 18 10/20/2018 0324   LDLCALC 89  07/08/2020 1048    CBC    Component Value Date/Time   WBC 7.4 08/23/2019 1152   WBC 7.0 05/22/2019 1201   RBC 5.09 08/23/2019 1152   RBC 5.29 05/22/2019 1201   HGB 14.7 08/23/2019 1152   HCT 44.2 08/23/2019 1152   PLT 193 05/22/2019 1201   PLT 221 02/13/2019 0854   MCV 87 08/23/2019 1152   MCH 28.9 08/23/2019 1152   MCH 29.3 05/22/2019 1201   MCHC 33.3 08/23/2019 1152   MCHC 32.1 05/22/2019 1201   RDW 13.2 08/23/2019 1152   LYMPHSABS 2.9 08/23/2019 1152   MONOABS 1.3 (H) 10/24/2018 0515   EOSABS 0.3 08/23/2019 1152   BASOSABS 0.1 08/23/2019 1152    ASSESSMENT AND PLAN: 1. Type 2 diabetes mellitus with other circulatory complication, with long-term current use of insulin (HCC) Continue current dose of insulin, healthy eating habits.  Encouraged him to try to move more.  If he does go to the gym, I recommend wiping down the machines before and after use - POCT glucose (manual entry) - POCT glycosylated hemoglobin (Hb A1C) - Microalbumin / creatinine urine ratio - insulin aspart protamine - aspart (NOVOLOG MIX 70/30 FLEXPEN) (70-30) 100 UNIT/ML FlexPen; Inject 0.24 mLs (24 Units total) into the skin 2 (two) times daily.  Dispense: 15 mL; Refill: 11  2. Hypertension associated with stage 3a chronic kidney disease due to type 2 diabetes mellitus ([place]) At goal.  Continue current medications and low-salt diet.  Kidney function has remained fairly stable.  3. Chronic diastolic (congestive) heart failure (HCC) Compensated.  4. Morbid obesity ([place]) See #1 above.    Patient was given the opportunity to ask questions.  Patient verbalized understanding of the plan and was able to repeat key elements of the plan.   Orders Placed This Encounter  Procedures  . Microalbumin / creatinine urine ratio  . POCT glucose (manual entry)  . POCT glycosylated hemoglobin (Hb A1C)     Requested Prescriptions   Signed Prescriptions Disp Refills  . insulin aspart protamine - aspart  (NOVOLOG MIX 70/30 FLEXPEN) (70-30) 100 UNIT/ML FlexPen 15 mL 11    Sig: Inject 0.24 mLs (24 Units total) into the skin 2 (two) times daily.    Return in about 4 months (around 03/09/2021).  Karle Plumber, MD, FACP

## 2020-11-08 ENCOUNTER — Encounter: Payer: Self-pay | Admitting: Internal Medicine

## 2020-11-08 DIAGNOSIS — E1121 Type 2 diabetes mellitus with diabetic nephropathy: Secondary | ICD-10-CM | POA: Insufficient documentation

## 2020-11-08 LAB — MICROALBUMIN / CREATININE URINE RATIO
Creatinine, Urine: 42.2 mg/dL
Microalb/Creat Ratio: 237 mg/g creat — ABNORMAL HIGH (ref 0–29)
Microalbumin, Urine: 99.9 ug/mL

## 2020-11-12 ENCOUNTER — Encounter: Payer: Self-pay | Admitting: Internal Medicine

## 2020-11-12 ENCOUNTER — Ambulatory Visit: Payer: Medicare HMO | Admitting: Family Medicine

## 2020-11-12 DIAGNOSIS — E7521 Fabry (-Anderson) disease: Secondary | ICD-10-CM | POA: Diagnosis not present

## 2020-11-20 ENCOUNTER — Encounter: Payer: Self-pay | Admitting: Internal Medicine

## 2020-11-20 ENCOUNTER — Other Ambulatory Visit: Payer: Self-pay | Admitting: Internal Medicine

## 2020-11-20 DIAGNOSIS — E1159 Type 2 diabetes mellitus with other circulatory complications: Secondary | ICD-10-CM

## 2020-11-21 ENCOUNTER — Encounter: Payer: Self-pay | Admitting: Internal Medicine

## 2020-11-21 NOTE — Telephone Encounter (Signed)
Script was not received! Will resend

## 2020-11-22 ENCOUNTER — Telehealth: Payer: Self-pay | Admitting: Internal Medicine

## 2020-11-22 NOTE — Telephone Encounter (Signed)
Phone call placed to patient this afternoon.  I left a voicemail message letting him know that I was calling to follow-up to see whether he got the issue resolved with getting his insulin.  I had asked my clinical pharmacist to reach out to him yesterday.  I was told by Lurena Joiner our clinical pharmacist that he did reach out and told the pt that you had to speak with your insurance.  I was following up to see if it was resolved.  If it was not resolved, I request that he send me a MyChart message so that we can figure out a plan B.

## 2020-11-23 ENCOUNTER — Encounter: Payer: Self-pay | Admitting: Internal Medicine

## 2020-11-26 DIAGNOSIS — E7521 Fabry (-Anderson) disease: Secondary | ICD-10-CM | POA: Diagnosis not present

## 2020-12-06 ENCOUNTER — Other Ambulatory Visit: Payer: Self-pay | Admitting: Internal Medicine

## 2020-12-06 DIAGNOSIS — G463 Brain stem stroke syndrome: Secondary | ICD-10-CM

## 2020-12-06 NOTE — Telephone Encounter (Signed)
Requested medication (s) are due for refill today: yes  Requested medication (s) are on the active medication list: yes  Last refill:  03/05/20  Future visit scheduled: yes  Notes to clinic:  needs lab work   Requested Prescriptions  Pending Prescriptions Disp Refills   clopidogrel (PLAVIX) 75 MG tablet [Pharmacy Med Name: CLOPIDOGREL 75 MG TABLET] 90 tablet 2    Sig: TAKE 1 TABLET BY [place]      Hematology: Antiplatelets - clopidogrel Failed - 12/06/2020  9:24 AM      Failed - Evaluate AST, ALT within 2 months of therapy initiation.      Failed - HCT in normal range and within 180 days    Hematocrit  Date Value Ref Range Status  08/23/2019 44.2 37.5 - 51.0 % Final          Failed - HGB in normal range and within 180 days    Hemoglobin  Date Value Ref Range Status  08/23/2019 14.7 13.0 - 17.7 g/dL Final   Total hemoglobin  Date Value Ref Range Status  07/26/2013 14.2 13.5 - 18.0 g/dL Final          Failed - PLT in normal range and within 180 days    Platelets  Date Value Ref Range Status  05/22/2019 193 150 - 400 K/uL Final  02/13/2019 221 150 - 450 x10E3/uL Final          Passed - ALT in normal range and within 360 days    ALT  Date Value Ref Range Status  07/08/2020 38 0 - 44 IU/L Final          Passed - AST in normal range and within 360 days    AST  Date Value Ref Range Status  07/08/2020 35 0 - 40 IU/L Final          Passed - Valid encounter within last 6 months    Recent Outpatient Visits           4 weeks ago Type 2 diabetes mellitus with other circulatory complication, with long-term current use of insulin ([place])   [place] Ladell Pier, MD   1 month ago Encounter for [employer] annual wellness exam   [place], Neoma Laming B, MD   5 months ago Type 2 diabetes mellitus with other circulatory complication, with long-term current use of insulin [facility])   [place] Karle Plumber B, MD   7 months ago Left hip pain   [place], Levada Dy M, [place]   10 months ago Type 2 diabetes mellitus with other circulatory complication, with long-term current use of insulin [facility])   [place], Deborah B, MD       Future Appointments             In 2 months Wynetta Emery Dalbert Batman, MD [place]

## 2020-12-07 ENCOUNTER — Other Ambulatory Visit: Payer: Self-pay | Admitting: Internal Medicine

## 2020-12-07 DIAGNOSIS — I10 Essential (primary) hypertension: Secondary | ICD-10-CM

## 2020-12-07 DIAGNOSIS — R3589 Other polyuria: Secondary | ICD-10-CM

## 2020-12-07 NOTE — Telephone Encounter (Signed)
Requested medication (s) are due for refill today: yes  Requested medication (s) are on the active medication list: yes  Last refill:  09/05/20 #270  Future visit scheduled: yes  Notes to clinic:  overdue lab work   Requested Prescriptions  Pending Prescriptions Disp Refills   allopurinol (ZYLOPRIM) 100 MG tablet [Pharmacy Med Name: ALLOPURINOL 100 MG TABLET] 270 tablet     Sig: TAKE 3 [place]      Endocrinology:  Gout Agents Failed - 12/07/2020  9:27 AM      Failed - Uric Acid in normal range and within 360 days    Uric Acid  Date Value Ref Range Status  10/06/2016 8.1 3.7 - 8.6 mg/dL Final    Comment:               Therapeutic target for gout patients: <6.0          Failed - Cr in normal range and within 360 days    Creat  Date Value Ref Range Status  11/07/2014 1.28 0.50 - 1.35 mg/dL Final   Creatinine, Ser  Date Value Ref Range Status  07/08/2020 1.96 (H) 0.76 - 1.27 mg/dL Final   Creatinine, Urine  Date Value Ref Range Status  08/30/2014 72.9 mg/dL Final    Comment:    No reference range established.          Passed - Valid encounter within last 12 months    Recent Outpatient Visits           1 month ago Type 2 diabetes mellitus with other circulatory complication, with long-term current use of insulin ([place])   [place] Ladell Pier, MD   1 month ago Encounter for [employer] annual wellness exam   [place], Neoma Laming B, MD   5 months ago Type 2 diabetes mellitus with other circulatory complication, with long-term current use of insulin [facility])   [place] Karle Plumber B, MD   7 months ago Left hip pain   [place], Levada Dy M, [place]   10 months ago Type 2 diabetes mellitus with other circulatory complication, with long-term current use of insulin [facility])   [place], Deborah B, MD       Future Appointments             In 2 months Ladell Pier, MD [place]              Signed Prescriptions Disp Refills   amLODipine (NORVASC) 10 MG tablet 90 tablet 0    Sig: TAKE 1 TABLET BY MOUTH EVERY DAY      Cardiovascular:  Calcium Channel Blockers Passed - 12/07/2020  9:27 AM      Passed - Last BP in normal range    BP Readings from Last 1 Encounters:  11/07/20 130/81          Passed - Valid encounter within last 6 months    Recent Outpatient Visits           1 month ago Type 2 diabetes mellitus with other circulatory complication, with long-term current use of insulin ([place])   [place] Ladell Pier, MD   1 month ago Encounter for [employer] annual wellness exam   [facility] Ladell Pier, MD  5 months ago Type 2 diabetes mellitus with other circulatory complication, with long-term current use of insulin ([place])   [place] Karle Plumber B, MD   7 months ago Left hip pain   [place], Levada Dy M, [place]   10 months ago Type 2 diabetes mellitus with other circulatory complication, with long-term current use of insulin [facility])   [place], MD       Future Appointments             In 2 months Ladell Pier, MD [place]               doxazosin (CARDURA) 2 MG tablet 45 tablet 0    Sig: TAKE 1/2 TABLET BY MOUTH EVERY DAY      Cardiovascular:  Alpha Blockers Passed - 12/07/2020  9:27 AM      Passed - Last BP in normal range    BP Readings from Last 1 Encounters:  11/07/20 130/81          Passed - Valid encounter within last 6 months    Recent Outpatient Visits           1 month ago Type 2 diabetes mellitus with other circulatory complication, with long-term  current use of insulin ([place])   [place] Ladell Pier, MD   1 month ago Encounter for [employer] annual wellness exam   [place], Neoma Laming B, MD   5 months ago Type 2 diabetes mellitus with other circulatory complication, with long-term current use of insulin [facility])   [place] Karle Plumber B, MD   7 months ago Left hip pain   [place], Levada Dy M, [place]   10 months ago Type 2 diabetes mellitus with other circulatory complication, with long-term current use of insulin [facility])   [place], MD       Future Appointments             In 2 months Wynetta Emery, Dalbert Batman, MD [place]

## 2020-12-07 NOTE — Telephone Encounter (Signed)
Requested Prescriptions  Pending Prescriptions Disp Refills  . amLODipine (NORVASC) 10 MG tablet [Pharmacy Med Name: AMLODIPINE BESYLATE 10 MG TAB] 90 tablet 0    Sig: TAKE 1 TABLET BY MOUTH EVERY DAY     Cardiovascular:  Calcium Channel Blockers Passed - 12/07/2020  9:27 AM      Passed - Last BP in normal range    BP Readings from Last 1 Encounters:  11/07/20 130/81         Passed - Valid encounter within last 6 months    Recent Outpatient Visits          1 month ago Type 2 diabetes mellitus with other circulatory complication, with long-term current use of insulin ([place])   [place] Ladell Pier, MD   1 month ago Encounter for [employer] annual wellness exam   [place], Neoma Laming B, MD   5 months ago Type 2 diabetes mellitus with other circulatory complication, with long-term current use of insulin [facility])   [place] Karle Plumber B, MD   7 months ago Left hip pain   [place], Levada Dy M, [place]   10 months ago Type 2 diabetes mellitus with other circulatory complication, with long-term current use of insulin [facility])   [place], MD      Future Appointments            In 2 months Wynetta Emery, Dalbert Batman, MD [place]           . doxazosin (CARDURA) 2 MG tablet [Pharmacy Med Name: DOXAZOSIN MESYLATE 2 MG TAB] 45 tablet 0    Sig: TAKE 1/2 TABLET BY MOUTH EVERY DAY     Cardiovascular:  Alpha Blockers Passed - 12/07/2020  9:27 AM      Passed - Last BP in normal range    BP Readings from Last 1 Encounters:  11/07/20 130/81         Passed - Valid encounter within last 6 months    Recent Outpatient Visits          1 month ago Type 2 diabetes mellitus with other circulatory complication, with long-term current use of insulin ([place])   [place] Ladell Pier, MD   1 month ago Encounter for [employer] annual wellness exam   [place], Neoma Laming B, MD   5 months ago Type 2 diabetes mellitus with other circulatory complication, with long-term current use of insulin [facility])   [place] Karle Plumber B, MD   7 months ago Left hip pain   [place], Levada Dy M, [place]   10 months ago Type 2 diabetes mellitus with other circulatory complication, with long-term current use of insulin [facility])   [place], MD      Future Appointments            In 2 months Wynetta Emery, Dalbert Batman, MD [place]           . allopurinol (ZYLOPRIM) 100 MG tablet [Pharmacy Med Name: ALLOPURINOL 100 MG TABLET] 270 tablet     Sig: TAKE 3 TABLETS BY MOUTH EVERY DAY     Endocrinology:  Gout Agents Failed - 12/07/2020  9:27  AM      Failed - Uric Acid in normal range and within 360 days    Uric Acid  Date Value Ref Range Status  10/06/2016 8.1 3.7 - 8.6 mg/dL Final    Comment:               Therapeutic target for gout patients: <6.0         Failed - Cr in normal range and within 360 days    Creat  Date Value Ref Range Status  11/07/2014 1.28 0.50 - 1.35 mg/dL Final   Creatinine, Ser  Date Value Ref Range Status  07/08/2020 1.96 (H) 0.76 - 1.27 mg/dL Final   Creatinine, Urine  Date Value Ref Range Status  08/30/2014 72.9 mg/dL Final    Comment:    No reference range established.         Passed - Valid encounter within last 12 months    Recent Outpatient Visits          1 month ago Type 2 diabetes mellitus with other circulatory complication, with long-term current use of insulin ([place])   [place] Ladell Pier, MD   1 month ago Encounter for [employer] annual wellness exam   [place] Karle Plumber B, MD   5 months ago Type 2 diabetes mellitus with other circulatory complication, with long-term current use of insulin [facility])   [place] Ladell Pier, MD   7 months ago Left hip pain   [place], Levada Dy M, [place]   10 months ago Type 2 diabetes mellitus with other circulatory complication, with long-term current use of insulin [facility])   [place], Deborah B, MD      Future Appointments            In 2 months Wynetta Emery Dalbert Batman, MD [place]

## 2020-12-17 DIAGNOSIS — E7521 Fabry (-Anderson) disease: Secondary | ICD-10-CM | POA: Diagnosis not present

## 2020-12-17 IMAGING — DX DG LUMBAR SPINE COMPLETE 4+V
5 series · 5 of 5 positions shown · non-contrast
Comparison: No prior.

CLINICAL DATA: Low back and left hip pain.

EXAM:
LUMBAR SPINE - COMPLETE 4+ VIEW

[l-spine ap]
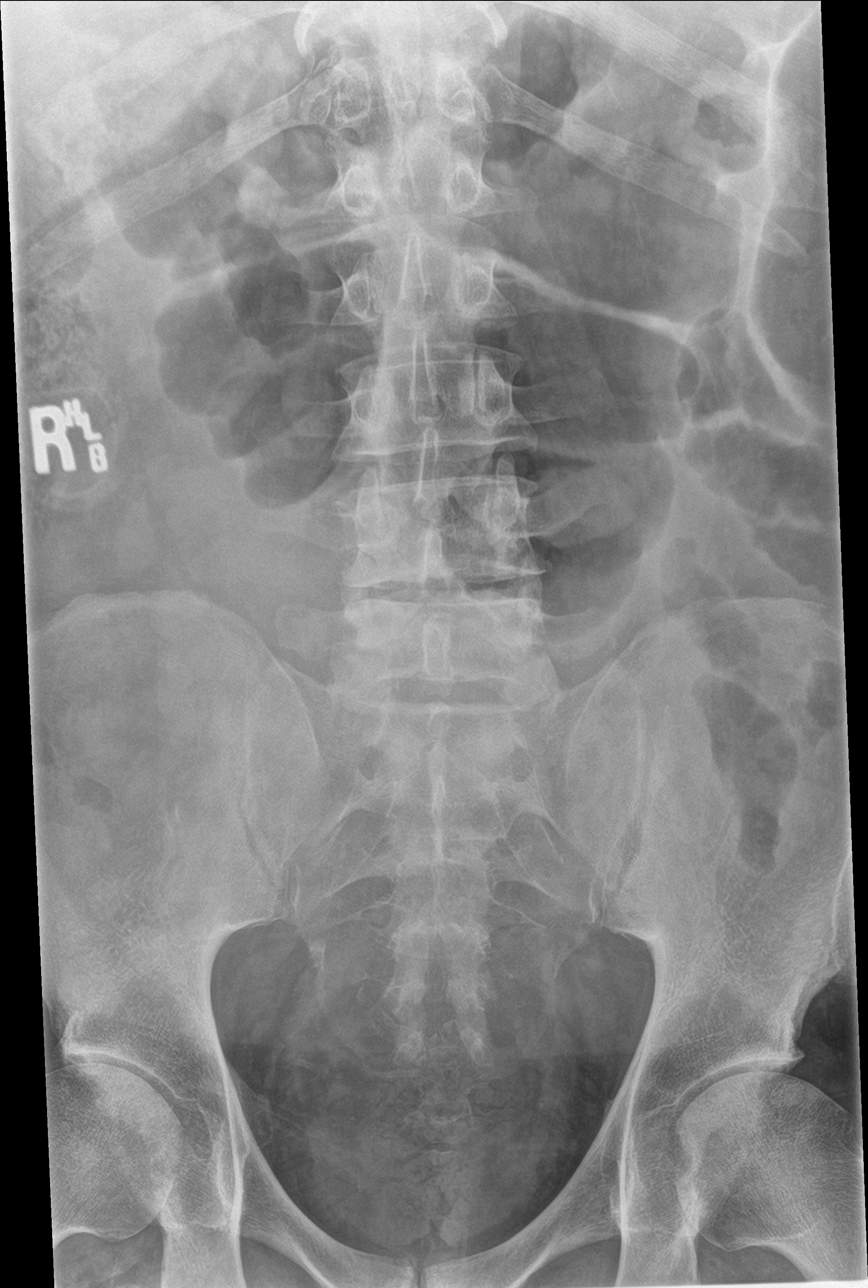

[l-spine obl (1 of 2)]
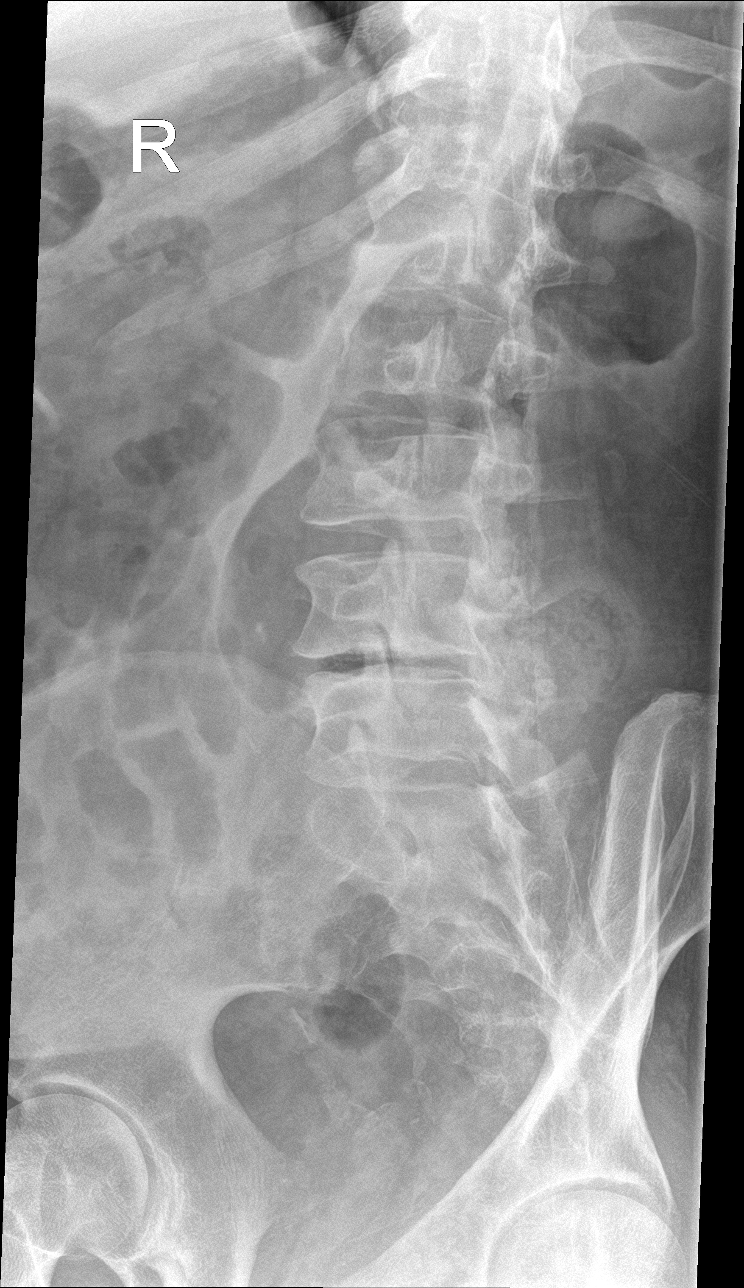

[l-spine obl (2 of 2)]
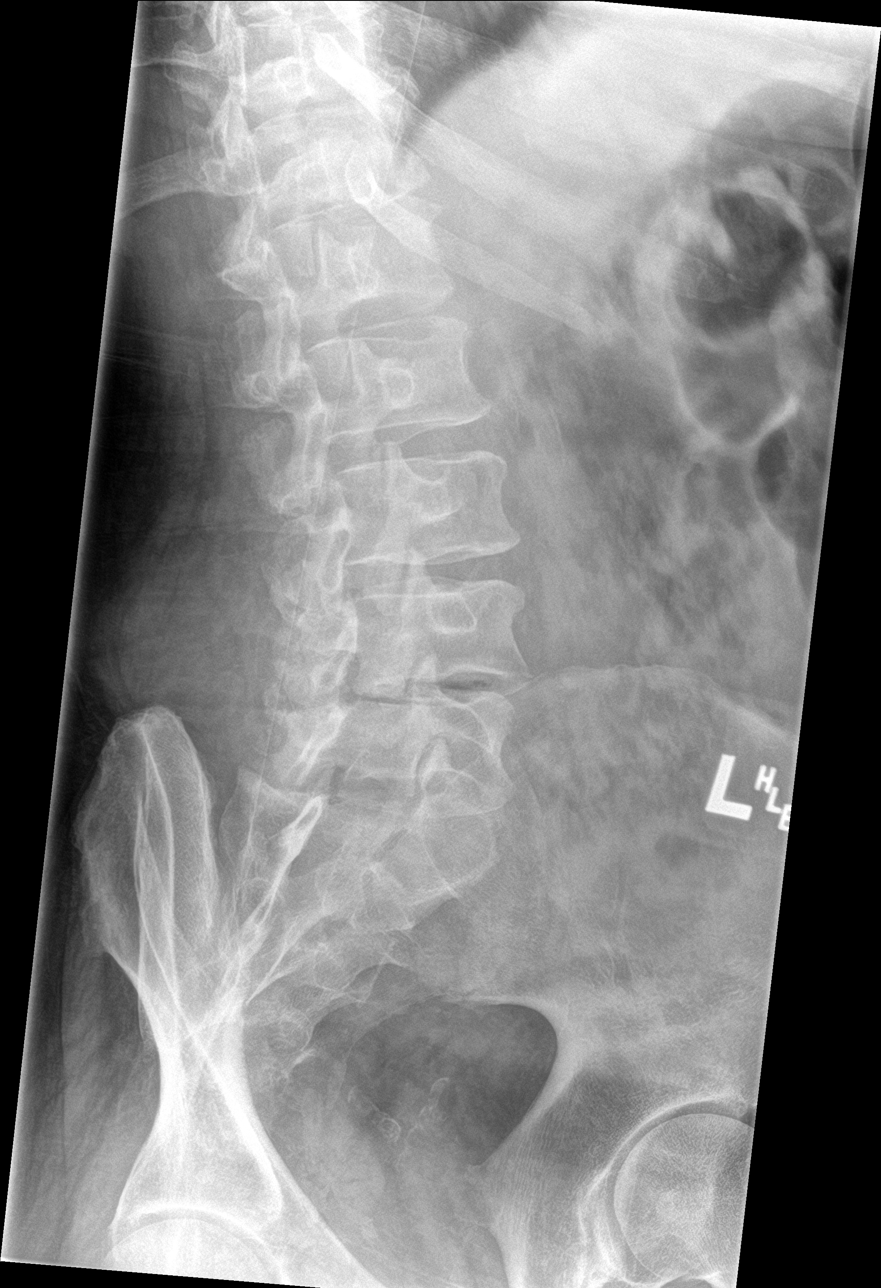

[l-spine lat]
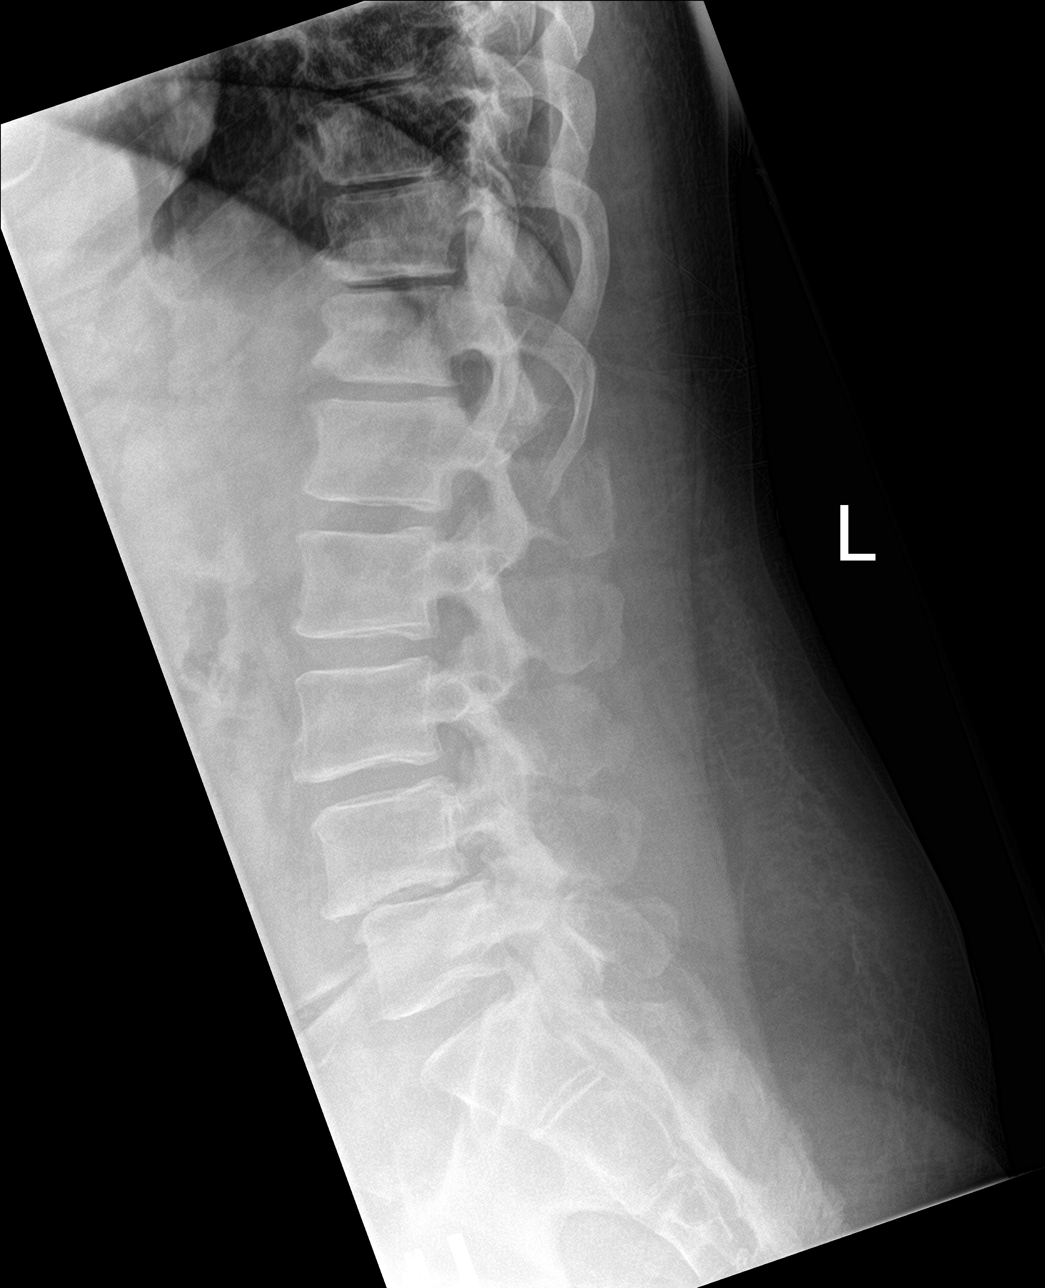

[l-spine spot]
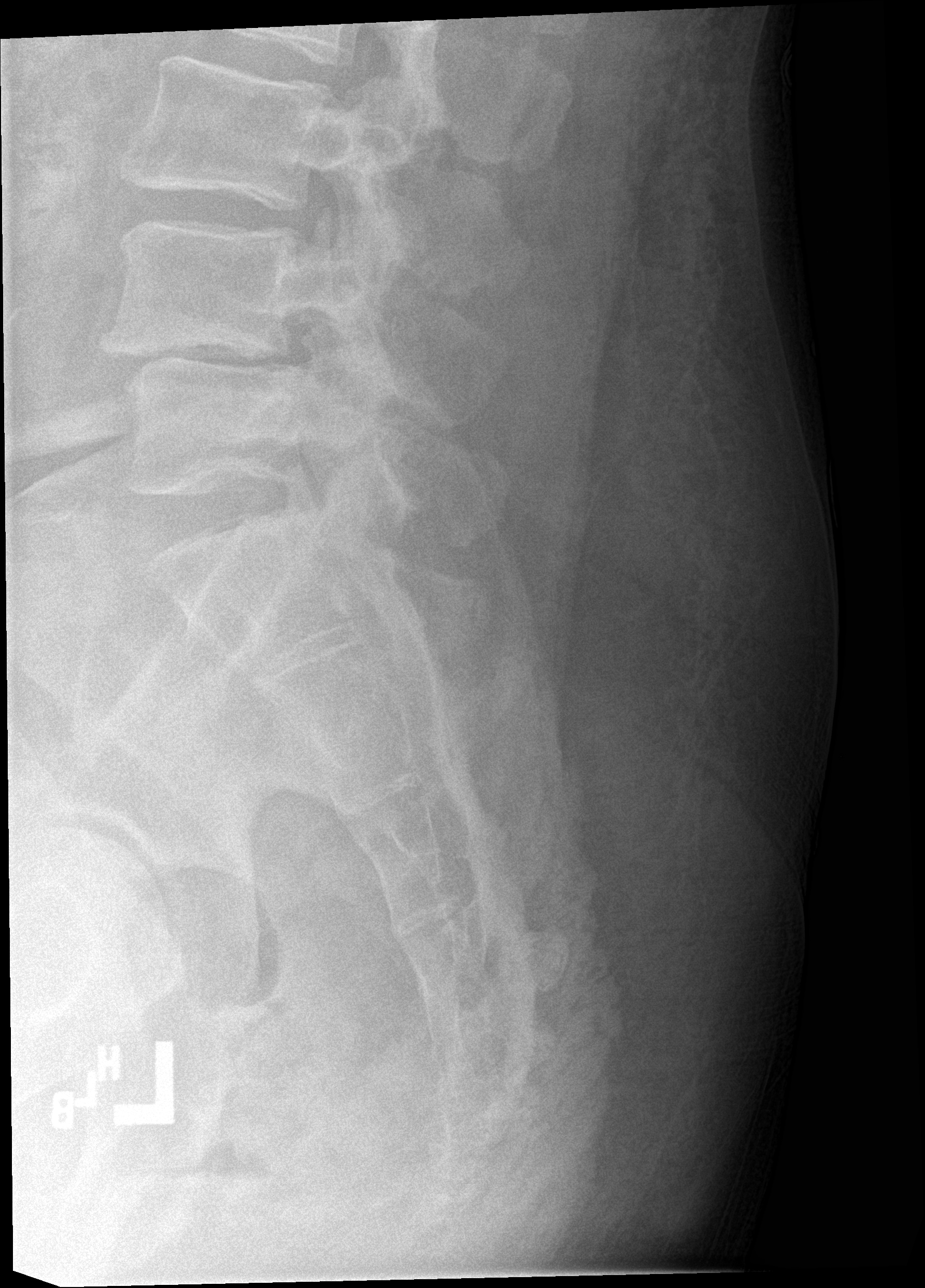

[5 of 5 positions shown; findings below may reference images not displayed]

FINDINGS: Paraspinal soft tissues are unremarkable. Diffuse multilevel
degenerative change. 5 mm anterolisthesis L4 on L5. No acute bony
abnormality. No evidence of fracture or dislocation.
IMPRESSION: Diffuse multilevel degenerative change. 5 mm anterolisthesis L4 on
L5. No acute bony abnormality. No evidence of fracture or
dislocation.

## 2020-12-22 ENCOUNTER — Ambulatory Visit (INDEPENDENT_AMBULATORY_CARE_PROVIDER_SITE_OTHER): Payer: Medicare HMO

## 2020-12-22 DIAGNOSIS — I441 Atrioventricular block, second degree: Secondary | ICD-10-CM | POA: Diagnosis not present

## 2020-12-23 LAB — CUP PACEART REMOTE DEVICE CHECK
Battery Remaining Longevity: 74 mo
Battery Remaining Percentage: 81 %
Battery Voltage: 3.01 V
Brady Statistic AP VP Percent: 69 %
Brady Statistic AP VS Percent: 1 %
Brady Statistic AS VP Percent: 30 %
Brady Statistic AS VS Percent: 1 %
Brady Statistic RA Percent Paced: 69 %
Brady Statistic RV Percent Paced: 99 %
Date Time Interrogation Session: 20220718020030
Implantable Lead Implant Date: 20120907
Implantable Lead Implant Date: 20120907
Implantable Lead Location: 753859
Implantable Lead Location: 753860
Implantable Pulse Generator Implant Date: 20201215
Lead Channel Impedance Value: 310 Ohm
Lead Channel Impedance Value: 380 Ohm
Lead Channel Pacing Threshold Amplitude: 0.625 V
Lead Channel Pacing Threshold Amplitude: 1.625 V
Lead Channel Pacing Threshold Pulse Width: 0.5 ms
Lead Channel Pacing Threshold Pulse Width: 0.8 ms
Lead Channel Sensing Intrinsic Amplitude: 5 mV
Lead Channel Setting Pacing Amplitude: 1.625
Lead Channel Setting Pacing Amplitude: 1.875
Lead Channel Setting Pacing Pulse Width: 0.8 ms
Lead Channel Setting Sensing Sensitivity: 4 mV
Pulse Gen Model: 2272
Pulse Gen Serial Number: 9187005

## 2020-12-31 DIAGNOSIS — E7521 Fabry (-Anderson) disease: Secondary | ICD-10-CM | POA: Diagnosis not present

## 2021-01-02 ENCOUNTER — Encounter: Payer: Self-pay | Admitting: Dietician

## 2021-01-02 ENCOUNTER — Encounter: Payer: Medicare HMO | Attending: Internal Medicine | Admitting: Dietician

## 2021-01-02 ENCOUNTER — Other Ambulatory Visit: Payer: Self-pay

## 2021-01-02 DIAGNOSIS — Z794 Long term (current) use of insulin: Secondary | ICD-10-CM | POA: Diagnosis not present

## 2021-01-02 DIAGNOSIS — E1159 Type 2 diabetes mellitus with other circulatory complications: Secondary | ICD-10-CM | POA: Insufficient documentation

## 2021-01-02 NOTE — Patient Instructions (Addendum)
Goals Continue to be consistent with the great changes that you have made!             Continue to stay active and go to the gym.             Continue to be mindful about the food choices that you make.             Continue to choose beverages without sugar.             Decreasing fat intake.             Decreasing salt.   Read your food labels.  Avoid any foods that have phos..in the ingredient list.  Avoid or choose a lower sodium breakfast meat.  Consider checking your blood sugar alternate times during the day as well as first thing in the morning.  When you drink regular soda, pay attention to how you feel.  Do you get more thirsty, increased urination, sleepy, etc.  Check your blood sugar after drinking it.

## 2021-01-02 NOTE — Progress Notes (Signed)
Medical Nutrition Therapy  Appointment Start time:  [phone]  Appointment End time:  1435  Primary concerns today:  Referral diagnosis: Type 2 Diabetes, obesity Preferred learning style: no preference indicated Learning readiness: ready   NUTRITION ASSESSMENT  Patient is here today alone.  He was last seen by this RD 10/10/2020. His weight is down 12 lbs since 07/2020 (some fluid and weight varies due to brace use). He states that the heat is bothering him. He is not cooking and eating mostly [place] and cheese sandwiches and tomato salad, cucumbers.  He uses little mayo.  Some fruit. Patient notes that foods are starting to taste very salty now (sausage, [place] food).  Discussed how the taste buds change and benefits of following a low sodium diet. Fasting glucose 119 this am and frequently in the low 100's.  He is not checking it at any other time.  Discussed benefit prior to appointments to help with medical management.  He walks with a cane.  He is uncomfortable about going to the gym due to covid and germs.  He has [employer] research officer, government.  He states he is going to go on Monday and is interested in using the pool to improve his balance. He does work outside in his yard.   Anthropometrics  287 lbs 01/02/2021 284 lbs 11/07/2020 288 lbs 11/06/2020 292 lbs 10/10/2020 298 lbs 08/15/2020 299 lbs 07/08/2020  Clinical Medical Hx: History includes:  Type 2 diabetes (2010) with microabluminuria, CKD stage 3, glaucoma, Fabry's disease (manifests as lymphedema), CVA, HTN, CHF, pacemaker Medications: see list to include:  Novolog 70/30 24 units bid Labs: A1C 7.7% ( 11/07/2020), 7.8% (07/08/20) increased from 7.4% 01/2020, cholesterol 165, HDL 63, LDL 89, triglycerides 69 07/08/20, GFR 42 (07/08/20)  Lifestyle & Dietary Hx Patient lives alone.  He has started to cook his foods fresh more frequently.  He states that he bakes most of his meats.  He does his own shopping. He walks with a cane. He is retired due to his  lymphedema.  He worked for [employer] and then [employer]. Tries to eat by 6:00 and no later than 7:30.  He occasionally will eat ice cream at night and verbalized a need to change that. He has stopped eating in front of the TV and eats in his kitchen. If he eats chips he will take a serving and put the bag away. He limits bread and juice. Avoids Bananas as his potassium has been elevated in the past. Avoids added salt.  Uses Mrs. Dash or hot sauce.  Estimated daily fluid intake: 64 oz Supplements: none Sleep: States that he sleep late as he is fearful that a tree will fall on his house (no more than 5 hours per night plus naps during the day).  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  He does some squats and other exercises including walking in his home.  24-Hr Dietary Recall First Meal: plain cheerios or cornflakes with almond milk or 1% milk, strawberries, 2 slices toast, 1 pat butter, occasional breakfast meat Snack: none Second Meal: skips OR tomato cucumber salad with [place] or eggs OR tuna with crackers Snack: none Third Meal:  LS [place] sandwich, small amount mayo, fruit Snack:  Beverages: water, almond milk, 1% milk, rare juice, 8 oz sweet tea occasionally, rare regular soda   NUTRITION DIAGNOSIS  NB-1.1  Food and nutrition-related knowledge deficit As related to balance of carbohydrate, protein, and fat.  As evidenced by diet hx and patient report.   NUTRITION INTERVENTION  Nutrition education (E-1) on the following topics:  Reviewed habits and provided encouragement for changes made Benefits of exercise Lower sodium Small portions meat Eating increased vegetables (unless potassium becomes elevated) Blood glucose monitoring and purpose  Handouts Provided Include - 07/2020 visit [facility] national kidney diet for those not  on dialysis Meal plan card  Learning Style & Readiness for Change Teaching method utilized: Visual & Auditory  Demonstrated degree of understanding via: Teach Back  Barriers to learning/adherence to lifestyle change: lymphadema  Goals Continue to be consistent with the great changes that you have made!  Continue to stay active and go to the gym.  Continue to be mindful about the food choices that you make.  Continue to choose beverages without sugar.  Decreasing fat intake.  Decreasing salt.  Read your food labels.  Avoid any foods that have phos..in the ingredient list.   MONITORING & EVALUATION Dietary intake, weekly physical activity, and label reading in 2-3 months.  Next Steps  Patient is to call or MyChart for questions.

## 2021-01-13 NOTE — Progress Notes (Signed)
Remote pacemaker transmission.   

## 2021-01-14 DIAGNOSIS — E7521 Fabry (-Anderson) disease: Secondary | ICD-10-CM | POA: Diagnosis not present

## 2021-01-28 DIAGNOSIS — E7521 Fabry (-Anderson) disease: Secondary | ICD-10-CM | POA: Diagnosis not present

## 2021-02-11 DIAGNOSIS — E7521 Fabry (-Anderson) disease: Secondary | ICD-10-CM | POA: Diagnosis not present

## 2021-02-16 ENCOUNTER — Other Ambulatory Visit: Payer: Self-pay

## 2021-02-16 ENCOUNTER — Telehealth: Payer: Self-pay

## 2021-02-16 ENCOUNTER — Ambulatory Visit: Payer: Medicare HMO | Attending: Internal Medicine | Admitting: Internal Medicine

## 2021-02-16 DIAGNOSIS — E1122 Type 2 diabetes mellitus with diabetic chronic kidney disease: Secondary | ICD-10-CM | POA: Diagnosis not present

## 2021-02-16 DIAGNOSIS — E1159 Type 2 diabetes mellitus with other circulatory complications: Secondary | ICD-10-CM

## 2021-02-16 DIAGNOSIS — N1831 Chronic kidney disease, stage 3a: Secondary | ICD-10-CM | POA: Diagnosis not present

## 2021-02-16 DIAGNOSIS — I129 Hypertensive chronic kidney disease with stage 1 through stage 4 chronic kidney disease, or unspecified chronic kidney disease: Secondary | ICD-10-CM

## 2021-02-16 DIAGNOSIS — Z23 Encounter for immunization: Secondary | ICD-10-CM

## 2021-02-16 DIAGNOSIS — I5032 Chronic diastolic (congestive) heart failure: Secondary | ICD-10-CM

## 2021-02-16 DIAGNOSIS — Z794 Long term (current) use of insulin: Secondary | ICD-10-CM

## 2021-02-16 DIAGNOSIS — L309 Dermatitis, unspecified: Secondary | ICD-10-CM | POA: Diagnosis not present

## 2021-02-16 NOTE — Telephone Encounter (Signed)
Pt has been scheduled and reminder has been mailed.  

## 2021-02-16 NOTE — Telephone Encounter (Signed)
-----   Message from Ladell Pier, MD sent at 02/16/2021  9:38 AM EDT ----- F/u in 4 mth

## 2021-02-16 NOTE — Progress Notes (Addendum)
Virtual Visit via Video Note  I connected with [name] on 02/16/21 at  9:10 AM EDT by a video enabled telemedicine application and verified that I am speaking with the correct person using two identifiers.  Location: Patient: home Provider: office   I discussed the limitations of evaluation and management by telemedicine and the availability of in person appointments. The patient expressed understanding and agreed to proceed.  History of Present Illness: Patient with history of diabetes type 2 with microalbuminuria, diastolic CHF with pacemaker (secconary to AV block with syncopy), CKD stage III, HOCM, HTN, HL, CVA, lymphedema. 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   Patient complains of having a crusting/glistening texture to the skin on his face.  He states he is very conscious of it and washes his face 2-4 times a day.  He states it is not noticeable but annoying to him.  Denies any flaking or peeling of skin left on the washcloth when he washes his face.  DM type II: Checking blood sugars 2-3 times a day before meals.  Blood sugars have remained in the 120s.  Reports compliance with taking his insulin NovoLog 70/30 24 units twice a day.  Eating habits are okay.  He avoids eating after 7 PM.  If he snacks he eats fruits only.  He states that when he last weighed himself when he went to get his infusion his weight was down to 277 pounds.  He went back to the gym to use the pool but found that it was too crowded in there so he has not gone back.  He is looking for some type of exercise that would help improve his balance.  HTN/CHF: Blood pressure is checked at the infusion center.  He reports his numbers have been good.  He does have a home blood pressure device but the readings have been consistently elevated.  He took his device with him to the infusion center and the nurse checked his blood pressure using their device and then his and found that his device  was considerably off.  He reports compliance with taking his medications.  Denies chest pains or shortness of breath.  He limits salt in the foods.  CKD 3: He has an appointment coming up with his nephrologist later in the month.  Due for second shingles vaccine, flu vaccine and Prevnar 20.  Outpatient Encounter Medications as of 02/16/2021  Medication Sig Note   Accu-Chek FastClix Lancets MISC 1 each by Other route 3 (three) times daily.    acetaminophen (TYLENOL) 325 MG tablet Take 1-2 tablets (325-650 mg total) by mouth every 4 (four) hours as needed for mild pain.    Agalsidase beta (FABRAZYME IV) Inject 115 mg into the vein every 14 (fourteen) days.     albuterol (PROAIR HFA) 108 (90 Base) MCG/ACT inhaler Inhale 1 puff into the lungs every 6 (six) hours as needed for wheezing or shortness of breath.    allopurinol (ZYLOPRIM) 100 MG tablet TAKE 3 TABLETS BY MOUTH EVERY DAY    amLODipine (NORVASC) 10 MG tablet TAKE 1 TABLET BY MOUTH EVERY DAY    atorvastatin (LIPITOR) 80 MG tablet TAKE 1 TABLET (80 MG TOTAL) BY MOUTH DAILY AT 6 PM.    clopidogrel (PLAVIX) 75 MG tablet TAKE 1 TABLET BY MOUTH EVERY DAY    doxazosin (CARDURA) 2 MG tablet TAKE 1/2 TABLET BY MOUTH EVERY DAY    fexofenadine (ALLEGRA) 180 MG tablet Take 1 tablet (180  mg total) by mouth daily. 05/14/2019: Pre med for infusion   fluticasone (FLONASE) 50 MCG/ACT nasal spray PLACE 1 SPRAY INTO BOTH NOSTRILS DAILY AS NEEDED FOR ALLERGIES.    furosemide (LASIX) 20 MG tablet TAKE 1 TABLET BY MOUTH EVERY DAY    glucose blood (ACCU-CHEK AVIVA PLUS) test strip 1 each by Other route 3 (three) times daily. Use as instructed    Insulin Pen Needle (PX SHORTLENGTH PEN NEEDLES) 31G X 8 MM MISC 1 each by Does not apply route 2 (two) times daily before a meal.    mupirocin ointment (BACTROBAN) 2 % Apply to affected area daily with dressing change.    NOVOLOG MIX 70/30 FLEXPEN (70-30) 100 UNIT/ML FlexPen INJECT 0.28 MLS (28 UNITS TOTAL) INTO THE SKIN  2 (TWO) TIMES DAILY.    valsartan-hydrochlorothiazide (DIOVAN HCT) 80-12.5 MG tablet Take 1 tablet by mouth daily. This is to replace Losartan/HCTZ    No facility-administered encounter medications on file as of 02/16/2021.      Observations/Objective:   Chemistry      Component Value Date/Time   NA 143 07/08/2020 1048   K 4.6 07/08/2020 1048   CL 103 07/08/2020 1048   CO2 24 07/08/2020 1048   BUN 23 07/08/2020 1048   CREATININE 1.96 (H) 07/08/2020 1048   CREATININE 1.28 11/07/2014 0910      Component Value Date/Time   CALCIUM 10.0 07/08/2020 1048   ALKPHOS 119 07/08/2020 1048   AST 35 07/08/2020 1048   ALT 38 07/08/2020 1048   BILITOT 0.8 07/08/2020 1048     Lab Results  Component Value Date   WBC 7.4 08/23/2019   HGB 14.7 08/23/2019   HCT 44.2 08/23/2019   MCV 87 08/23/2019   PLT 193 05/22/2019     Assessment and Plan: 1. Type 2 diabetes mellitus with other circulatory complication, with long-term current use of insulin ([place]) Reported blood readings are good. Encouraged to continue healthy eating habits and trying to move as much as he can.  Commended him on weight loss so far.  Recommend trying yoga type exercises to help improve balance. - CBC; Future - Basic metabolic panel; Future - Hemoglobin A1c; Future  2. Hypertension associated with stage 3a chronic kidney disease due to type 2 diabetes mellitus ([place]) Reported readings have been good at his infusions.  He will continue current medications and low-salt diet.  3. Chronic diastolic (congestive) heart failure (HCC) Clinical stable.  Continue furosemide, Diovan/HCTZ  4. Morbid obesity ([place]) See #1 above.  5. Dermatitis of face - Ambulatory referral to Dermatology  6. Influenza vaccine needed 7. Need for vaccination against Streptococcus pneumoniae Patient will come to the lab a week from today.  At that time he will see the clinical pharmacist to get the flu vaccine and Prevnar 20.  We will plan to give  the second zoster vaccine on his next visit.   Follow Up Instructions: 4 mths   I discussed the assessment and treatment plan with the patient. The patient was provided an opportunity to ask questions and all were answered. The patient agreed with the plan and demonstrated an understanding of the instructions.   The patient was advised to call back or seek an in-person evaluation if the symptoms worsen or if the condition fails to improve as anticipated.  I spent 22 minutes dedicated to the care of this patient on the date of this encounter to include previsit review of records/my last note, face-to-face time with the patient and postvisit  entering of orders.  Karle Plumber, MD

## 2021-02-23 ENCOUNTER — Ambulatory Visit: Payer: Medicare HMO | Attending: Internal Medicine | Admitting: Pharmacist

## 2021-02-23 ENCOUNTER — Other Ambulatory Visit: Payer: Self-pay

## 2021-02-23 DIAGNOSIS — Z23 Encounter for immunization: Secondary | ICD-10-CM | POA: Diagnosis not present

## 2021-02-23 DIAGNOSIS — Z794 Long term (current) use of insulin: Secondary | ICD-10-CM | POA: Diagnosis not present

## 2021-02-23 DIAGNOSIS — E1159 Type 2 diabetes mellitus with other circulatory complications: Secondary | ICD-10-CM | POA: Diagnosis not present

## 2021-02-23 NOTE — Progress Notes (Signed)
Patient presents for vaccination against strep pneumo and influenza per orders of Dr. Wynetta Emery. Consent given. Counseling provided. No contraindications exists. Vaccine administered without incident.   Benard Halsted, PharmD, [place], [place] [phone]

## 2021-02-24 LAB — BASIC METABOLIC PANEL
BUN/Creatinine Ratio: 14 (ref 10–24)
BUN: 27 mg/dL (ref 8–27)
CO2: 24 mmol/L (ref 20–29)
Calcium: 9.7 mg/dL (ref 8.6–10.2)
Chloride: 107 mmol/L — ABNORMAL HIGH (ref 96–106)
Creatinine, Ser: 1.88 mg/dL — ABNORMAL HIGH (ref 0.76–1.27)
Glucose: 169 mg/dL — ABNORMAL HIGH (ref 65–99)
Potassium: 4.7 mmol/L (ref 3.5–5.2)
Sodium: 146 mmol/L — ABNORMAL HIGH (ref 134–144)
eGFR: 40 mL/min/{1.73_m2} — ABNORMAL LOW (ref >59)

## 2021-02-24 LAB — HEMOGLOBIN A1C
Est. average glucose Bld gHb Est-mCnc: 166 mg/dL
Hgb A1c MFr Bld: 7.4 % — ABNORMAL HIGH (ref 4.8–5.6)

## 2021-02-24 LAB — CBC
Hematocrit: 44.5 % (ref 37.5–51.0)
Hemoglobin: 15 g/dL (ref 13.0–17.7)
MCH: 29.6 pg (ref 26.6–33.0)
MCHC: 33.7 g/dL (ref 31.5–35.7)
MCV: 88 fL (ref 79–97)
Platelets: 198 10*3/uL (ref 150–450)
RBC: 5.06 x10E6/uL (ref 4.14–5.80)
RDW: 13 % (ref 11.6–15.4)
WBC: 7.2 10*3/uL (ref 3.4–10.8)

## 2021-02-25 DIAGNOSIS — E7521 Fabry (-Anderson) disease: Secondary | ICD-10-CM | POA: Diagnosis not present

## 2021-03-05 DIAGNOSIS — M109 Gout, unspecified: Secondary | ICD-10-CM | POA: Diagnosis not present

## 2021-03-05 DIAGNOSIS — E118 Type 2 diabetes mellitus with unspecified complications: Secondary | ICD-10-CM | POA: Diagnosis not present

## 2021-03-05 DIAGNOSIS — I890 Lymphedema, not elsewhere classified: Secondary | ICD-10-CM | POA: Diagnosis not present

## 2021-03-05 DIAGNOSIS — I129 Hypertensive chronic kidney disease with stage 1 through stage 4 chronic kidney disease, or unspecified chronic kidney disease: Secondary | ICD-10-CM | POA: Diagnosis not present

## 2021-03-05 DIAGNOSIS — E7521 Fabry (-Anderson) disease: Secondary | ICD-10-CM | POA: Diagnosis not present

## 2021-03-05 DIAGNOSIS — I639 Cerebral infarction, unspecified: Secondary | ICD-10-CM | POA: Diagnosis not present

## 2021-03-05 DIAGNOSIS — I441 Atrioventricular block, second degree: Secondary | ICD-10-CM | POA: Diagnosis not present

## 2021-03-05 DIAGNOSIS — N1831 Chronic kidney disease, stage 3a: Secondary | ICD-10-CM | POA: Diagnosis not present

## 2021-03-11 DIAGNOSIS — E7521 Fabry (-Anderson) disease: Secondary | ICD-10-CM | POA: Diagnosis not present

## 2021-03-12 ENCOUNTER — Other Ambulatory Visit: Payer: Self-pay | Admitting: Internal Medicine

## 2021-03-12 DIAGNOSIS — I10 Essential (primary) hypertension: Secondary | ICD-10-CM

## 2021-03-12 DIAGNOSIS — R3589 Other polyuria: Secondary | ICD-10-CM

## 2021-03-12 NOTE — Telephone Encounter (Signed)
Requested Prescriptions  Pending Prescriptions Disp Refills  . doxazosin (CARDURA) 2 MG tablet [Pharmacy Med Name: DOXAZOSIN MESYLATE 2 MG TAB] 45 tablet 0    Sig: TAKE 1/2 TABLET BY MOUTH EVERY DAY     Cardiovascular:  Alpha Blockers Passed - 03/12/2021  1:47 AM      Passed - Last BP in normal range    BP Readings from Last 1 Encounters:  11/07/20 130/81         Passed - Valid encounter within last 6 months    Recent Outpatient Visits          2 weeks ago Need for vaccination against Streptococcus pneumoniae   [place], Annie Main L, RPH-CPP   3 weeks ago Type 2 diabetes mellitus with other circulatory complication, with long-term current use of insulin ([place])   [place], Neoma Laming B, MD   4 months ago Type 2 diabetes mellitus with other circulatory complication, with long-term current use of insulin ([place])   [place] Ladell Pier, MD   4 months ago Encounter for [employer] annual wellness exam   [place], Neoma Laming B, MD   8 months ago Type 2 diabetes mellitus with other circulatory complication, with long-term current use of insulin [facility])   [place], MD      Future Appointments            In 3 months Wynetta Emery, Dalbert Batman, MD [place]           . amLODipine (NORVASC) 10 MG tablet [Pharmacy Med Name: AMLODIPINE BESYLATE 10 MG TAB] 90 tablet 0    Sig: TAKE 1 TABLET BY MOUTH EVERY DAY     Cardiovascular:  Calcium Channel Blockers Passed - 03/12/2021  1:47 AM      Passed - Last BP in normal range    BP Readings from Last 1 Encounters:  11/07/20 130/81         Passed - Valid encounter within last 6 months    Recent Outpatient Visits          2 weeks ago Need for vaccination against Streptococcus pneumoniae   [place], Annie Main L, RPH-CPP   3 weeks ago Type 2 diabetes mellitus with other circulatory complication, with long-term current use of insulin ([place])   [place], Neoma Laming B, MD   4 months ago Type 2 diabetes mellitus with other circulatory complication, with long-term current use of insulin ([place])   [place] Ladell Pier, MD   4 months ago Encounter for [employer] annual wellness exam   [place] [employer], Neoma Laming B, MD   8 months ago Type 2 diabetes mellitus with other circulatory complication, with long-term current use of insulin [facility])   [place], MD      Future Appointments            In 3 months Wynetta Emery, Dalbert Batman, MD [place]

## 2021-03-15 ENCOUNTER — Other Ambulatory Visit: Payer: Self-pay | Admitting: Internal Medicine

## 2021-03-15 NOTE — Telephone Encounter (Signed)
Requested Prescriptions  Pending Prescriptions Disp Refills  . valsartan-hydrochlorothiazide (DIOVAN-HCT) 80-12.5 MG tablet [Pharmacy Med Name: VALSARTAN-HCTZ 80-12.5 MG TAB] 90 tablet 0    Sig: TAKE 1 TABLET BY MOUTH DAILY. THIS IS TO REPLACE LOSARTAN/HCTZ     Cardiovascular: ARB + Diuretic Combos Failed - 03/15/2021  1:32 PM      Failed - Na in normal range and within 180 days    Sodium  Date Value Ref Range Status  02/23/2021 146 (H) 134 - 144 mmol/L Final         Failed - Cr in normal range and within 180 days    Creat  Date Value Ref Range Status  11/07/2014 1.28 0.50 - 1.35 mg/dL Final   Creatinine, Ser  Date Value Ref Range Status  02/23/2021 1.88 (H) 0.76 - 1.27 mg/dL Final   Creatinine, Urine  Date Value Ref Range Status  08/30/2014 72.9 mg/dL Final    Comment:    No reference range established.         Passed - K in normal range and within 180 days    Potassium  Date Value Ref Range Status  02/23/2021 4.7 3.5 - 5.2 mmol/L Final         Passed - Ca in normal range and within 180 days    Calcium  Date Value Ref Range Status  02/23/2021 9.7 8.6 - 10.2 mg/dL Final   Calcium, Ion  Date Value Ref Range Status  05/22/2019 1.16 1.15 - 1.40 mmol/L Final         Passed - Patient is not pregnant      Passed - Last BP in normal range    BP Readings from Last 1 Encounters:  11/07/20 130/81         Passed - Valid encounter within last 6 months    Recent Outpatient Visits          2 weeks ago Need for vaccination against Streptococcus pneumoniae   [place], Annie Main L, RPH-CPP   3 weeks ago Type 2 diabetes mellitus with other circulatory complication, with long-term current use of insulin ([place])   [place], Neoma Laming B, MD   4 months ago Type 2 diabetes mellitus with other circulatory complication, with long-term current use of insulin ([place])   [place] Ladell Pier, MD   5 months ago Encounter for [employer] annual wellness exam   [place], Neoma Laming B, MD   8 months ago Type 2 diabetes mellitus with other circulatory complication, with long-term current use of insulin [name])   [place], MD      Future Appointments            In 3 months Wynetta Emery, Dalbert Batman, MD [place]

## 2021-03-18 ENCOUNTER — Encounter: Payer: Self-pay | Admitting: Internal Medicine

## 2021-03-18 MED ORDER — ALBUTEROL SULFATE HFA 108 (90 BASE) MCG/ACT IN AERS: 1.0000 | INHALATION_SPRAY | Freq: Four times a day (QID) | RESPIRATORY_TRACT | 1 refills | Status: AC | PRN

## 2021-03-23 ENCOUNTER — Ambulatory Visit (INDEPENDENT_AMBULATORY_CARE_PROVIDER_SITE_OTHER): Payer: Medicare HMO

## 2021-03-23 DIAGNOSIS — I441 Atrioventricular block, second degree: Secondary | ICD-10-CM | POA: Diagnosis not present

## 2021-03-25 DIAGNOSIS — E7521 Fabry (-Anderson) disease: Secondary | ICD-10-CM | POA: Diagnosis not present

## 2021-03-26 LAB — CUP PACEART REMOTE DEVICE CHECK
Battery Remaining Longevity: 71 mo
Battery Remaining Percentage: 77 %
Battery Voltage: 3.01 V
Brady Statistic AP VP Percent: 70 %
Brady Statistic AP VS Percent: 1 %
Brady Statistic AS VP Percent: 29 %
Brady Statistic AS VS Percent: 1 %
Brady Statistic RA Percent Paced: 70 %
Brady Statistic RV Percent Paced: 99 %
Date Time Interrogation Session: 20221017020025
Implantable Lead Implant Date: 20120907
Implantable Lead Implant Date: 20120907
Implantable Lead Location: 753859
Implantable Lead Location: 753860
Implantable Pulse Generator Implant Date: 20201215
Lead Channel Impedance Value: 340 Ohm
Lead Channel Impedance Value: 380 Ohm
Lead Channel Pacing Threshold Amplitude: 0.75 V
Lead Channel Pacing Threshold Amplitude: 1.625 V
Lead Channel Pacing Threshold Pulse Width: 0.5 ms
Lead Channel Pacing Threshold Pulse Width: 0.8 ms
Lead Channel Sensing Intrinsic Amplitude: 5 mV
Lead Channel Setting Pacing Amplitude: 1.75 V
Lead Channel Setting Pacing Amplitude: 1.875
Lead Channel Setting Pacing Pulse Width: 0.8 ms
Lead Channel Setting Sensing Sensitivity: 4 mV
Pulse Gen Model: 2272
Pulse Gen Serial Number: 9187005

## 2021-03-30 DIAGNOSIS — Z794 Long term (current) use of insulin: Secondary | ICD-10-CM | POA: Diagnosis not present

## 2021-03-30 DIAGNOSIS — H2513 Age-related nuclear cataract, bilateral: Secondary | ICD-10-CM | POA: Diagnosis not present

## 2021-03-30 DIAGNOSIS — E119 Type 2 diabetes mellitus without complications: Secondary | ICD-10-CM | POA: Diagnosis not present

## 2021-03-30 DIAGNOSIS — H02831 Dermatochalasis of right upper eyelid: Secondary | ICD-10-CM | POA: Diagnosis not present

## 2021-03-30 DIAGNOSIS — H02423 Myogenic ptosis of bilateral eyelids: Secondary | ICD-10-CM | POA: Diagnosis not present

## 2021-03-30 DIAGNOSIS — H35033 Hypertensive retinopathy, bilateral: Secondary | ICD-10-CM | POA: Diagnosis not present

## 2021-03-30 DIAGNOSIS — H02834 Dermatochalasis of left upper eyelid: Secondary | ICD-10-CM | POA: Diagnosis not present

## 2021-03-30 DIAGNOSIS — E7521 Fabry (-Anderson) disease: Secondary | ICD-10-CM | POA: Diagnosis not present

## 2021-03-30 DIAGNOSIS — H1045 Other chronic allergic conjunctivitis: Secondary | ICD-10-CM | POA: Diagnosis not present

## 2021-03-30 DIAGNOSIS — I639 Cerebral infarction, unspecified: Secondary | ICD-10-CM | POA: Diagnosis not present

## 2021-03-30 LAB — HM DIABETES EYE EXAM

## 2021-04-01 NOTE — Progress Notes (Signed)
Remote pacemaker transmission.   

## 2021-04-08 DIAGNOSIS — E7521 Fabry (-Anderson) disease: Secondary | ICD-10-CM | POA: Diagnosis not present

## 2021-04-13 ENCOUNTER — Encounter: Payer: Self-pay | Admitting: Internal Medicine

## 2021-04-16 ENCOUNTER — Telehealth (INDEPENDENT_AMBULATORY_CARE_PROVIDER_SITE_OTHER): Payer: Self-pay

## 2021-04-16 NOTE — Telephone Encounter (Signed)
Copied from [place] [id number]. Topic: General - Other >> Apr 14, 2021  9:37 AM Tessa Lerner A wrote: Reason for CRM: The patient has called to provide contact information on where their, previously discussed, records can be submitted  The patient would like their 5 years worth of records submitted to   [facility]  [place] [address]  Please contact if needed  Patient will need to come into the office to fill out [place] form, if one has not been filled and send to HIM for release.

## 2021-04-22 DIAGNOSIS — E7521 Fabry (-Anderson) disease: Secondary | ICD-10-CM | POA: Diagnosis not present

## 2021-05-06 DIAGNOSIS — E7521 Fabry (-Anderson) disease: Secondary | ICD-10-CM | POA: Diagnosis not present

## 2021-05-12 ENCOUNTER — Encounter: Payer: Self-pay | Admitting: Internal Medicine

## 2021-05-15 DIAGNOSIS — E261 Secondary hyperaldosteronism: Secondary | ICD-10-CM | POA: Diagnosis not present

## 2021-05-15 DIAGNOSIS — G8929 Other chronic pain: Secondary | ICD-10-CM | POA: Diagnosis not present

## 2021-05-15 DIAGNOSIS — E1151 Type 2 diabetes mellitus with diabetic peripheral angiopathy without gangrene: Secondary | ICD-10-CM | POA: Diagnosis not present

## 2021-05-15 DIAGNOSIS — I130 Hypertensive heart and chronic kidney disease with heart failure and stage 1 through stage 4 chronic kidney disease, or unspecified chronic kidney disease: Secondary | ICD-10-CM | POA: Diagnosis not present

## 2021-05-15 DIAGNOSIS — E1136 Type 2 diabetes mellitus with diabetic cataract: Secondary | ICD-10-CM | POA: Diagnosis not present

## 2021-05-15 DIAGNOSIS — I509 Heart failure, unspecified: Secondary | ICD-10-CM | POA: Diagnosis not present

## 2021-05-15 DIAGNOSIS — I495 Sick sinus syndrome: Secondary | ICD-10-CM | POA: Diagnosis not present

## 2021-05-15 DIAGNOSIS — E1122 Type 2 diabetes mellitus with diabetic chronic kidney disease: Secondary | ICD-10-CM | POA: Diagnosis not present

## 2021-05-15 DIAGNOSIS — E785 Hyperlipidemia, unspecified: Secondary | ICD-10-CM | POA: Diagnosis not present

## 2021-05-15 DIAGNOSIS — Z6841 Body Mass Index (BMI) 40.0 and over, adult: Secondary | ICD-10-CM | POA: Diagnosis not present

## 2021-05-15 DIAGNOSIS — Z794 Long term (current) use of insulin: Secondary | ICD-10-CM | POA: Diagnosis not present

## 2021-05-20 DIAGNOSIS — E7521 Fabry (-Anderson) disease: Secondary | ICD-10-CM | POA: Diagnosis not present

## 2021-06-03 DIAGNOSIS — E7521 Fabry (-Anderson) disease: Secondary | ICD-10-CM | POA: Diagnosis not present

## 2021-06-06 ENCOUNTER — Other Ambulatory Visit: Payer: Self-pay | Admitting: Internal Medicine

## 2021-06-10 ENCOUNTER — Other Ambulatory Visit: Payer: Self-pay | Admitting: Internal Medicine

## 2021-06-11 ENCOUNTER — Other Ambulatory Visit: Payer: Self-pay | Admitting: Internal Medicine

## 2021-06-11 DIAGNOSIS — I10 Essential (primary) hypertension: Secondary | ICD-10-CM

## 2021-06-11 DIAGNOSIS — R3589 Other polyuria: Secondary | ICD-10-CM

## 2021-06-11 NOTE — Telephone Encounter (Signed)
Requested Prescriptions  Pending Prescriptions Disp Refills   doxazosin (CARDURA) 2 MG tablet [Pharmacy Med Name: DOXAZOSIN MESYLATE 2 MG TAB] 45 tablet 0    Sig: TAKE 1/2 TABLET BY MOUTH EVERY DAY     Cardiovascular:  Alpha Blockers Passed - 06/11/2021  1:42 AM      Passed - Last BP in normal range    BP Readings from Last 1 Encounters:  11/07/20 130/81         Passed - Valid encounter within last 6 months    Recent Outpatient Visits          3 months ago Need for vaccination against Streptococcus pneumoniae   [place], Annie Main L, RPH-CPP   3 months ago Type 2 diabetes mellitus with other circulatory complication, with long-term current use of insulin ([place])   [place], Neoma Laming B, MD   7 months ago Type 2 diabetes mellitus with other circulatory complication, with long-term current use of insulin ([place])   [place] Ladell Pier, MD   7 months ago Encounter for [employer] annual wellness exam   [place], Neoma Laming B, MD   11 months ago Type 2 diabetes mellitus with other circulatory complication, with long-term current use of insulin ([place])   [place], MD      Future Appointments            In 1 week Ladell Pier, MD [place]            amLODipine (NORVASC) 10 MG tablet [Pharmacy Med Name: AMLODIPINE BESYLATE 10 MG TAB] 90 tablet 0    Sig: TAKE 1 TABLET BY MOUTH EVERY DAY     Cardiovascular:  Calcium Channel Blockers Passed - 06/11/2021  1:42 AM      Passed - Last BP in normal range    BP Readings from Last 1 Encounters:  11/07/20 130/81         Passed - Valid encounter within last 6 months    Recent Outpatient Visits          3 months ago Need for vaccination against Streptococcus pneumoniae   [place], Annie Main L, RPH-CPP   3 months ago Type 2 diabetes mellitus with other circulatory complication, with long-term current use of insulin ([place])   [place], Neoma Laming B, MD   7 months ago Type 2 diabetes mellitus with other circulatory complication, with long-term current use of insulin ([place])   [place] Ladell Pier, MD   7 months ago Encounter for [employer] annual wellness exam   [place], Neoma Laming B, MD   11 months ago Type 2 diabetes mellitus with other circulatory complication, with long-term current use of insulin [facility])   [place], MD      Future Appointments            In 1 week Ladell Pier, MD [place]

## 2021-06-15 ENCOUNTER — Other Ambulatory Visit: Payer: Self-pay | Admitting: Internal Medicine

## 2021-06-15 DIAGNOSIS — G463 Brain stem stroke syndrome: Secondary | ICD-10-CM

## 2021-06-15 NOTE — Telephone Encounter (Signed)
Requested medications are due for refill today.  yes  Requested medications are on the active medications list.  yes  Last refill. 03/28/2020  Future visit scheduled.   yes  Notes to clinic.  Pharmacy needs Dx code.    Requested Prescriptions  Pending Prescriptions Disp Refills   atorvastatin (LIPITOR) 80 MG tablet [Pharmacy Med Name: ATORVASTATIN 80 MG TABLET] 90 tablet 1    Sig: TAKE 1 TABLET BY MOUTH DAILY AT 6 PM.     Cardiovascular:  Antilipid - Statins Passed - 06/15/2021  9:11 AM      Passed - Total Cholesterol in normal range and within 360 days    Cholesterol, Total  Date Value Ref Range Status  07/08/2020 165 100 - 199 mg/dL Final          Passed - LDL in normal range and within 360 days    LDL Chol Calc (NIH)  Date Value Ref Range Status  07/08/2020 89 0 - 99 mg/dL Final          Passed - HDL in normal range and within 360 days    HDL  Date Value Ref Range Status  07/08/2020 63 >39 mg/dL Final          Passed - Triglycerides in normal range and within 360 days    Triglycerides  Date Value Ref Range Status  07/08/2020 69 0 - 149 mg/dL Final          Passed - Patient is not pregnant      Passed - Valid encounter within last 12 months    Recent Outpatient Visits           3 months ago Need for vaccination against Streptococcus pneumoniae   [place], Annie Main L, RPH-CPP   3 months ago Type 2 diabetes mellitus with other circulatory complication, with long-term current use of insulin ([place])   [place], Neoma Laming B, MD   7 months ago Type 2 diabetes mellitus with other circulatory complication, with long-term current use of insulin ([place])   [place] Ladell Pier, MD   8 months ago Encounter for [employer] annual wellness exam   [place] Karle Plumber B, MD   11 months ago Type 2 diabetes mellitus with  other circulatory complication, with long-term current use of insulin [facility])   [place], MD       Future Appointments             In 4 days Ladell Pier, MD [place]

## 2021-06-17 ENCOUNTER — Encounter: Payer: Self-pay | Admitting: Pharmacist

## 2021-06-17 ENCOUNTER — Encounter: Payer: Self-pay | Admitting: Internal Medicine

## 2021-06-17 DIAGNOSIS — Z9229 Personal history of other drug therapy: Secondary | ICD-10-CM

## 2021-06-17 DIAGNOSIS — E7521 Fabry (-Anderson) disease: Secondary | ICD-10-CM | POA: Diagnosis not present

## 2021-06-17 DIAGNOSIS — G463 Brain stem stroke syndrome: Secondary | ICD-10-CM

## 2021-06-17 MED ORDER — ATORVASTATIN CALCIUM 80 MG PO TABS: ORAL_TABLET | ORAL | 2 refills | Status: DC

## 2021-06-17 NOTE — Progress Notes (Signed)
[facility])                                            [place] Team                                        Statin Quality Measure Assessment    06/17/2021   [name] [birth year] [id number]  Per review of chart and payor information, patient has a diagnosis of diabetes but is not currently filling a statin prescription.  This places patient into the SUPD (Statin Use In Patients with Diabetes) measure for CMS.    Atorvastatin is on the patient's medication list but it has not been filled since 10/21.  He has an upcoming appointment 06/19/21. If deemed therapeutically appropriate, the patient's statin therapy could be assessed during the visit. If the patient has experienced statin intolerance, a statin exclusion code could be associated with the visit which would remove the patient from the measure.  The ASCVD Risk score (Arnett DK, et al., 2019) failed to calculate for the following reasons:   The patient has a prior MI or stroke diagnosis 07/08/2020     Component Value Date/Time   CHOL 165 07/08/2020 1048   TRIG 69 07/08/2020 1048   HDL 63 07/08/2020 1048   CHOLHDL 2.6 07/08/2020 1048   CHOLHDL 4.4 10/20/2018 0324   VLDL 18 10/20/2018 0324   LDLCALC 89 07/08/2020 1048    Please consider ONE of the following recommendations:  Initiate high intensity statin Atorvastatin 40mg  once daily, #90, 3 refills   Rosuvastatin 20mg  once daily, #90, 3 refills    Initiate moderate intensity          statin with reduced frequency if prior          statin intolerance 1x weekly, #13, 3 refills   2x weekly, #26, 3 refills   3x weekly, #39, 3 refills    Code for past statin intolerance or  other exclusions (required annually)   Provider Requirements:  Associate code during an office visit or telehealth encounter  Drug Induced Myopathy G72.0   Myopathy, unspecified G72.9   Myositis, unspecified M60.9   Rhabdomyolysis  W80.32   Alcoholic fatty liver Z22.4   Cirrhosis of liver K74.69   Prediabetes R73.03   PCOS E28.2   Toxic liver disease, unspecified K71.9        Plan: Send note to PCP prior to upcoming appointment.  Elayne Guerin, PharmD, [place] Clinical Pharmacist [phone]

## 2021-06-19 ENCOUNTER — Ambulatory Visit: Payer: Medicare HMO | Attending: Internal Medicine | Admitting: Internal Medicine

## 2021-06-19 ENCOUNTER — Other Ambulatory Visit: Payer: Self-pay

## 2021-06-19 ENCOUNTER — Encounter: Payer: Self-pay | Admitting: Internal Medicine

## 2021-06-19 DIAGNOSIS — E1122 Type 2 diabetes mellitus with diabetic chronic kidney disease: Secondary | ICD-10-CM | POA: Diagnosis not present

## 2021-06-19 DIAGNOSIS — M79671 Pain in right foot: Secondary | ICD-10-CM

## 2021-06-19 DIAGNOSIS — M79672 Pain in left foot: Secondary | ICD-10-CM | POA: Diagnosis not present

## 2021-06-19 DIAGNOSIS — Z794 Long term (current) use of insulin: Secondary | ICD-10-CM | POA: Diagnosis not present

## 2021-06-19 DIAGNOSIS — E1159 Type 2 diabetes mellitus with other circulatory complications: Secondary | ICD-10-CM | POA: Diagnosis not present

## 2021-06-19 DIAGNOSIS — I129 Hypertensive chronic kidney disease with stage 1 through stage 4 chronic kidney disease, or unspecified chronic kidney disease: Secondary | ICD-10-CM

## 2021-06-19 DIAGNOSIS — N1831 Chronic kidney disease, stage 3a: Secondary | ICD-10-CM

## 2021-06-19 NOTE — Progress Notes (Signed)
[name], male   DOB: 01-20-1961, 61 y.o.   MRN: [id number] Virtual Visit via Telephone Note  I connected with [name] on 06/19/2021 at 9:15 AM by telephone and verified that I am speaking with the correct person using two identifiers  Location: Patient: home Provider: office  Participants: Myself Patient   I discussed the limitations, risks, security and privacy concerns of performing an evaluation and management service by telephone and the availability of in person appointments. I also discussed with the patient that there may be a patient responsible charge related to this service. The patient expressed understanding and agreed to proceed.   History of Present Illness: Patient with history of diabetes type 2 with microalbuminuria, diastolic CHF with pacemaker (secconary to AV block with syncopy), CKD stage III, HOCM, HTN, HL, CVA, lymphedema. 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  Last evaluated 02/2021.  Purpose of today's visit is chronic disease management.  DM/obesity: not seeing much changes in wgh and feels disappointed about that.  Back in the gym 2-3 times a wk.  Walks on treadmill for 30 mins.  Still doing well in terms of his eating habits. Gave up juices and sodas.  Drinking mainly water.  Does not eat after 7 p.m and does not snack during the day.  Last wgh was 299 lbs at his infusion earlier this wk up from 277 lbs reported to me on last visit.  He is not sure whether he is retaining some fluid. -feels he would do better with getting tennis shoes that fit.  Wears wide sneakers but has problem where his instep and top of foot swells BL.  He has been looking at online stores to make a purchase but he is not sure what would be a good fit for him. -last a1c 7.4 Reports BS "have been steady." Checking BS 3x/day, would like to check more but does not like sticking himself 4 x day.  Range in a.m and lunch 120-127 a.m and p.m  120-130s.  -He is on NovoLog 70/30 24 units twice a day.  HTN/CKD 3/Diastolic CHF:  checks BP once a day in a.m before taking his meds.  Reports SBP always around 200.  HE questions the accuracy of his machine.  SBP 2 days ago at his infusion was 147 but otherwise always in the 130s.  Of note he takes his blood pressure medications before going to his infusion therapy -Reports compliance with meds that include Diovan/HCTZ, Cardura, amlodipine 10 mg daily. -uses salt substitutes and garlic -has appt with neph later this mth.  Last GFR was 40 with creatinine of 1.88.  HL;  taking and tolerating Lipitor  Outpatient Encounter Medications as of 06/19/2021  Medication Sig Note   Accu-Chek FastClix Lancets MISC 1 each by Other route 3 (three) times daily.    acetaminophen (TYLENOL) 325 MG tablet Take 1-2 tablets (325-650 mg total) by mouth every 4 (four) hours as needed for mild pain.    Agalsidase beta (FABRAZYME IV) Inject 115 mg into the vein every 14 (fourteen) days.     albuterol (PROAIR HFA) 108 (90 Base) MCG/ACT inhaler Inhale 1 puff into the lungs every 6 (six) hours as needed for wheezing or shortness of breath.    allopurinol (ZYLOPRIM) 100 MG tablet TAKE 3 TABLETS BY MOUTH EVERY DAY    amLODipine (NORVASC) 10 MG tablet TAKE 1 TABLET BY MOUTH EVERY DAY    atorvastatin (LIPITOR) 80 MG tablet  TAKE 1 TABLET BY MOUTH DAILY AT 6 PM.    clopidogrel (PLAVIX) 75 MG tablet TAKE 1 TABLET BY MOUTH EVERY DAY    doxazosin (CARDURA) 2 MG tablet TAKE 1/2 TABLET BY MOUTH EVERY DAY    fexofenadine (ALLEGRA) 180 MG tablet Take 1 tablet (180 mg total) by mouth daily. 05/14/2019: Pre med for infusion   fluticasone (FLONASE) 50 MCG/ACT nasal spray PLACE 1 SPRAY INTO BOTH NOSTRILS DAILY AS NEEDED FOR ALLERGIES.    furosemide (LASIX) 20 MG tablet TAKE 1 TABLET BY MOUTH EVERY DAY    glucose blood (ACCU-CHEK AVIVA PLUS) test strip 1 each by Other route 3 (three) times daily. Use as instructed    Insulin Pen Needle  (PX SHORTLENGTH PEN NEEDLES) 31G X 8 MM MISC 1 each by Does not apply route 2 (two) times daily before a meal.    mupirocin ointment (BACTROBAN) 2 % Apply to affected area daily with dressing change.    NOVOLOG MIX 70/30 FLEXPEN (70-30) 100 UNIT/ML FlexPen INJECT 0.28 MLS (28 UNITS TOTAL) INTO THE SKIN 2 (TWO) TIMES DAILY.    valsartan-hydrochlorothiazide (DIOVAN-HCT) 80-12.5 MG tablet TAKE 1 TABLET BY MOUTH DAILY. THIS IS TO REPLACE LOSARTAN/HCTZ    No facility-administered encounter medications on file as of 06/19/2021.      Observations/Objective: No direct observation done as this was a telephone visit.   Chemistry      Component Value Date/Time   NA 146 (H) 02/23/2021 1026   K 4.7 02/23/2021 1026   CL 107 (H) 02/23/2021 1026   CO2 24 02/23/2021 1026   BUN 27 02/23/2021 1026   CREATININE 1.88 (H) 02/23/2021 1026   CREATININE 1.28 11/07/2014 0910      Component Value Date/Time   CALCIUM 9.7 02/23/2021 1026   ALKPHOS 119 07/08/2020 1048   AST 35 07/08/2020 1048   ALT 38 07/08/2020 1048   BILITOT 0.8 07/08/2020 1048     Lab Results  Component Value Date   WBC 7.2 02/23/2021   HGB 15.0 02/23/2021   HCT 44.5 02/23/2021   MCV 88 02/23/2021   PLT 198 02/23/2021   Lab Results  Component Value Date   HGBA1C 7.4 (H) 02/23/2021     Assessment and Plan: 1. Type 2 diabetes mellitus with other circulatory complication, with long-term current use of insulin (HCC) Continue current dose of insulin.  Reported blood sugar readings are at goal. Advised patient to check with his insurance to see if they will pay for him to get the Dexcom or libre device so that he does not have to stick himself several times a day. - Ambulatory referral to Podiatry - Lipid panel - Hemoglobin A1c; Future - Comprehensive metabolic panel; Future  2. Hypertension associated with stage 3a chronic kidney disease due to type 2 diabetes mellitus ([place]) Continue current medications. Advised to check blood  pressure 1 to 2 hours after he takes his medications in the mornings.  Let me know via MyChart if blood pressures running greater than 130/80.  3. Morbid obesity ([place]) Commended him on regular exercise and encouraged him to keep it up.  Encouraged him to continue healthy eating habits.  We discussed referral to medical weight management.  Patient wants to hold off at this time.  4. Stage 3a chronic kidney disease (HCC) Stable.  He will keep upcoming appointment with nephrology.  5. Foot pain, bilateral I recommend that he sees his podiatrist Dr. Prudence Davidson to see what would be the best option for him in  terms of footwear.  He does have chronic lymphedema associated with Fibrase disease, this makes it a bit difficult in terms of shoes that would fit.  May need to purchase 1 or 2 sizes up. - Ambulatory referral to Podiatry   Follow Up Instructions: 4 mths   I discussed the assessment and treatment plan with the patient. The patient was provided an opportunity to ask questions and all were answered. The patient agreed with the plan and demonstrated an understanding of the instructions.   The patient was advised to call back or seek an in-person evaluation if the symptoms worsen or if the condition fails to improve as anticipated.  I  Spent 22 minutes on this telephone encounter  Karle Plumber, MD

## 2021-06-22 ENCOUNTER — Telehealth: Payer: Self-pay

## 2021-06-22 ENCOUNTER — Ambulatory Visit (INDEPENDENT_AMBULATORY_CARE_PROVIDER_SITE_OTHER): Payer: Medicare HMO

## 2021-06-22 DIAGNOSIS — I441 Atrioventricular block, second degree: Secondary | ICD-10-CM | POA: Diagnosis not present

## 2021-06-22 NOTE — Telephone Encounter (Signed)
-----   Message from Ladell Pier, MD sent at 06/19/2021  9:41 AM EST ----- Give patient follow-up appointment with me in 4 months.

## 2021-06-22 NOTE — Telephone Encounter (Signed)
Pt has been scheduled and reminder has been mailed.  

## 2021-06-23 LAB — CUP PACEART REMOTE DEVICE CHECK
Battery Remaining Longevity: 68 mo
Battery Remaining Percentage: 74 %
Battery Voltage: 2.99 V
Brady Statistic AP VP Percent: 70 %
Brady Statistic AP VS Percent: 1 %
Brady Statistic AS VP Percent: 30 %
Brady Statistic AS VS Percent: 1 %
Brady Statistic RA Percent Paced: 69 %
Brady Statistic RV Percent Paced: 99 %
Date Time Interrogation Session: 20230116024440
Implantable Lead Implant Date: 20120907
Implantable Lead Implant Date: 20120907
Implantable Lead Location: 753859
Implantable Lead Location: 753860
Implantable Pulse Generator Implant Date: 20201215
Lead Channel Impedance Value: 340 Ohm
Lead Channel Impedance Value: 390 Ohm
Lead Channel Pacing Threshold Amplitude: 0.75 V
Lead Channel Pacing Threshold Amplitude: 1.625 V
Lead Channel Pacing Threshold Pulse Width: 0.5 ms
Lead Channel Pacing Threshold Pulse Width: 0.8 ms
Lead Channel Sensing Intrinsic Amplitude: 5 mV
Lead Channel Sensing Intrinsic Amplitude: 9.4 mV
Lead Channel Setting Pacing Amplitude: 1.75 V
Lead Channel Setting Pacing Amplitude: 1.875
Lead Channel Setting Pacing Pulse Width: 0.8 ms
Lead Channel Setting Sensing Sensitivity: 4 mV
Pulse Gen Model: 2272
Pulse Gen Serial Number: 9187005

## 2021-06-29 ENCOUNTER — Encounter: Payer: Self-pay | Admitting: Internal Medicine

## 2021-06-29 ENCOUNTER — Other Ambulatory Visit: Payer: Self-pay | Admitting: Internal Medicine

## 2021-06-29 ENCOUNTER — Ambulatory Visit: Payer: Medicare HMO | Admitting: Podiatry

## 2021-06-29 DIAGNOSIS — N1831 Chronic kidney disease, stage 3a: Secondary | ICD-10-CM | POA: Diagnosis not present

## 2021-06-29 DIAGNOSIS — I890 Lymphedema, not elsewhere classified: Secondary | ICD-10-CM | POA: Diagnosis not present

## 2021-06-29 DIAGNOSIS — I639 Cerebral infarction, unspecified: Secondary | ICD-10-CM | POA: Diagnosis not present

## 2021-06-29 DIAGNOSIS — I441 Atrioventricular block, second degree: Secondary | ICD-10-CM | POA: Diagnosis not present

## 2021-06-29 DIAGNOSIS — E7521 Fabry (-Anderson) disease: Secondary | ICD-10-CM | POA: Diagnosis not present

## 2021-06-29 DIAGNOSIS — I129 Hypertensive chronic kidney disease with stage 1 through stage 4 chronic kidney disease, or unspecified chronic kidney disease: Secondary | ICD-10-CM | POA: Diagnosis not present

## 2021-06-29 DIAGNOSIS — E118 Type 2 diabetes mellitus with unspecified complications: Secondary | ICD-10-CM | POA: Diagnosis not present

## 2021-06-29 DIAGNOSIS — M109 Gout, unspecified: Secondary | ICD-10-CM | POA: Diagnosis not present

## 2021-06-29 DIAGNOSIS — E1159 Type 2 diabetes mellitus with other circulatory complications: Secondary | ICD-10-CM

## 2021-06-29 MED ORDER — DEXCOM G6 SENSOR MISC: 1.0000 | Freq: Every day | 11 refills | Status: DC

## 2021-06-29 MED ORDER — DEXCOM G6 TRANSMITTER MISC: 1.0000 | Freq: Every day | 4 refills | Status: DC

## 2021-06-29 MED ORDER — DEXCOM G6 RECEIVER DEVI: 1.0000 | Freq: Every day | 0 refills | Status: DC

## 2021-07-01 ENCOUNTER — Other Ambulatory Visit: Payer: Self-pay

## 2021-07-01 ENCOUNTER — Ambulatory Visit (INDEPENDENT_AMBULATORY_CARE_PROVIDER_SITE_OTHER): Payer: Medicare HMO | Admitting: Podiatry

## 2021-07-01 DIAGNOSIS — R809 Proteinuria, unspecified: Secondary | ICD-10-CM | POA: Insufficient documentation

## 2021-07-01 DIAGNOSIS — M2141 Flat foot [pes planus] (acquired), right foot: Secondary | ICD-10-CM | POA: Diagnosis not present

## 2021-07-01 DIAGNOSIS — M2142 Flat foot [pes planus] (acquired), left foot: Secondary | ICD-10-CM | POA: Diagnosis not present

## 2021-07-01 DIAGNOSIS — E0843 Diabetes mellitus due to underlying condition with diabetic autonomic (poly)neuropathy: Secondary | ICD-10-CM | POA: Diagnosis not present

## 2021-07-01 DIAGNOSIS — R609 Edema, unspecified: Secondary | ICD-10-CM | POA: Insufficient documentation

## 2021-07-01 DIAGNOSIS — E785 Hyperlipidemia, unspecified: Secondary | ICD-10-CM | POA: Insufficient documentation

## 2021-07-01 DIAGNOSIS — E7521 Fabry (-Anderson) disease: Secondary | ICD-10-CM | POA: Diagnosis not present

## 2021-07-01 DIAGNOSIS — J329 Chronic sinusitis, unspecified: Secondary | ICD-10-CM | POA: Insufficient documentation

## 2021-07-01 DIAGNOSIS — L259 Unspecified contact dermatitis, unspecified cause: Secondary | ICD-10-CM | POA: Insufficient documentation

## 2021-07-01 DIAGNOSIS — I890 Lymphedema, not elsewhere classified: Secondary | ICD-10-CM | POA: Diagnosis not present

## 2021-07-01 NOTE — Progress Notes (Signed)
Remote pacemaker transmission.   

## 2021-07-03 ENCOUNTER — Encounter: Payer: Medicare HMO | Attending: Internal Medicine | Admitting: Dietician

## 2021-07-03 ENCOUNTER — Other Ambulatory Visit: Payer: Self-pay

## 2021-07-03 ENCOUNTER — Encounter: Payer: Self-pay | Admitting: Dietician

## 2021-07-03 DIAGNOSIS — E669 Obesity, unspecified: Secondary | ICD-10-CM | POA: Insufficient documentation

## 2021-07-03 DIAGNOSIS — E1169 Type 2 diabetes mellitus with other specified complication: Secondary | ICD-10-CM | POA: Insufficient documentation

## 2021-07-03 NOTE — Progress Notes (Signed)
Medical Nutrition Therapy  Appointment Start time:  [phone] Appointment End time:  2683 Patient is here today alone.  He was last seen by this RD 7/296/2022. Primary concerns today:  Referral diagnosis: Type 2 Diabetes, obesity Preferred learning style: no preference indicated Learning readiness: ready   NUTRITION ASSESSMENT  Patient is here today alone.  He was last seen by this RD 10/10/2020. He went to his nephrologist this week and had labs drawn - results unknown. He has a prescription for Dexcom.  Patient was not aware of what this was or how it worked.   He goes to the gym (treadmill) 2 days per week  for 15 minutes.  Takes a break and is cautious due to balance. Drinking more water New Years Resolution:  avoid regular soda, fruit juice, decrease diet soda Eating dinner off a saucer (smaller portion). Fun food Friday once per month (Pizza or [place]) - portion size is very large and needs to portion this.   Eating more fresh vegetables.   Less fried foods, less potatoes  Dinner before 7  He walks with a cane.   He has [employer] research officer, government.   Started walking at the gym on the treadmill.  He is concerned with falling.  He does work outside in his yard.  Anthropometrics  294 lbs 07/03/2021 with leather coat 287 lbs 01/02/2021 284 lbs 11/07/2020 288 lbs 11/06/2020 292 lbs 10/10/2020 298 lbs 08/15/2020 299 lbs 07/08/2020  Clinical Medical Hx: History includes:  Type 2 diabetes (2010) with microabluminuria, CKD stage 3, glaucoma, Fabry's disease (manifests as lymphedema), CVA, HTN, CHF, pacemaker Medications: see list to include:  Novolog 70/30 24 units bid Labs: A1C 7.7% ( 11/07/2020), 7.8% (07/08/20) increased from 7.4% 01/2020, cholesterol 165, HDL 63, LDL 89, triglycerides 69 07/08/20, GFR 42 (07/08/20)   Lifestyle & Dietary Hx Patient lives alone.  He has started to cook his foods fresh more frequently.  He states that he bakes most of his meats.  He does his own shopping. He walks with a  cane. He is retired due to his lymphedema.  He worked for [employer] and then [employer].  Avoids Bananas as his potassium has been elevated in the past. Avoids added salt.  Uses Mrs. Dash or hot sauce.  Estimated daily fluid intake: 64 oz Supplements: none Sleep: States that he sleep late as he is fearful that a tree will fall on his house (no more than 5 hours per night plus naps during the day).  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  He does some squats and other exercises including walking in his home.  24-Hr Dietary Recall First Meal: plain cheerios or cornflakes with almond milk or 1% milk, strawberries, 2 slices toast, 1 pat butter, occasional breakfast meat Snack: none Second Meal: skips OR tomato cucumber salad with [place] or eggs OR tuna with crackers Snack: none Third Meal:  LS [place] sandwich, small amount mayo, fruit Snack:  Beverages: water, almond milk, 1% milk, rare juice, 8 oz sweet tea occasionally, rare regular soda   NUTRITION DIAGNOSIS  NB-1.1 Food and nutrition-related knowledge deficit As related to balance of carbohydrate, protein, and fat.  As evidenced by diet hx and patient report.   NUTRITION INTERVENTION  Nutrition education (E-1) on the following topics:  Reviewed habits and provided encouragement for changes made Benefits of exercise Lower  sodium Small portions meat Eating increased vegetables (unless potassium becomes elevated) Blood glucose monitoring and purpose - Dexcom, how it works and to call for training when he gets this  Handouts Provided Include - 07/2020 visit NKD national kidney diet for those not on dialysis Meal plan card My Plate (12/1163)  Learning Style & Readiness for Change Teaching method utilized: Visual & Auditory  Demonstrated degree of understanding via: Teach Back   Barriers to learning/adherence to lifestyle change: lymphadema  Goals Continue to be consistent with the great changes that you have made!             Continue to stay active and go to the gym.  Slowly increase the time.             Continue to be mindful about the food choices that you make.             Continue to choose beverages without sugar.             Decreasing fat intake.             Decreasing salt.  (Less canned beans -or rinse beans, avoid processed meat such as sausage, bacon)  Smaller portion of cereal or less often.  Stop when satisfied.  Call me when you get the Dexcom for a training appointment.     MONITORING & EVALUATION Dietary intake, weekly physical activity, and label reading in 2-3 months.  Next Steps  Patient is to call or MyChart for questions.

## 2021-07-03 NOTE — Patient Instructions (Addendum)
Continue to be consistent with the great changes that you have made!             Continue to stay active and go to the gym.  Slowly increase the time.             Continue to be mindful about the food choices that you make.             Continue to choose beverages without sugar.             Decreasing fat intake.             Decreasing salt.  (Less canned beans -or rinse beans, avoid processed meat such as sausage, bacon)  Smaller portion of cereal or less often.  Stop when satisfied.  Call me when you get the Dexcom for a training appointment.

## 2021-07-07 NOTE — Progress Notes (Signed)
HPI: 61 y.o. male presenting today as a new patient for evaluation of routine diabetic foot exam.  Patient is interested in diabetic shoes with insoles.  He states that he had some in the past but they were very on fashionable and he would like a better pair of diabetic shoes.  He was last seen here in the office 02/20/2020.  He presents today for further treatment and evaluation  Past Medical History:  Diagnosis Date   Bell's palsy    Bifascicular block    Chronic bronchitis ([place])    "seasonal; get it q yr"   Chronic diastolic CHF (congestive heart failure) ([place]) 2010   Chronic kidney disease (CKD), stage II (mild)    stage II to III/notes 12/01/348   Diastolic heart failure secondary to hypertrophic cardiomyopathy ([place]) CARDIOLOGIST-- DR Daneen Schick   Dyspnea    increased exertion    Edema 07/2013   Fabry's disease ([place]) RENAL AND CARDIAC INVOVLEMENT   FOLLOWED DR [place]   Gout, arthritis 2014   bil feet. right worse   History of cellulitis of skin with lymphangitis LEFT LEG   HOCM (hypertrophic obstructive cardiomyopathy) ([place])    a. Echo 11/16: Severe LVH, EF 55-60%, abnormal GLS consistent with HOCM, no SAM, mild LAE   Hypertension 20 years   Lymphedema of lower extremity LEFT  >  RIGHT   "using ankle high socks at home; pump doesn't work for me" (07/23/2014)   Pacemaker    02/12/11   Pneumonia 08/2011   Seasonal asthma NO INHALERS   30 years   Second degree Mobitz II AV block    with syncope, s/p PPM   Short of breath on exertion    Type II diabetes mellitus ([place]) 2012   INSULIN DEPENDENT    Past Surgical History:  Procedure Laterality Date   CARDIAC CATHETERIZATION  03-12-2011  DR [place] WITH LV CAVITY  APPEARANCE CONSISTANT WITH SIGNIFICANT APICAL HYPERTROPHY/ NORMAL LVSF / EF 55%/ EVIDENCE OF DIASTOLIC DYSFUNCTION WITH EDP OF 23-47mmHg AFTER A-WAVE/ NORMAL CORONARY ARTERIES   EVALUATION UNDER ANESTHESIA WITH FISTULECTOMY N/A  06/23/2015   Procedure: EXAM UNDER ANESTHESIA WITH POSSIBLE FISTULOTOMY;  Surgeon: Judeth Horn, MD;  Location: [facility];  Service: General;  Laterality: N/A;   HERNIA REPAIR  0938   Umbilical Hernia Repair   INCISION AND DRAINAGE PERIRECTAL ABSCESS Left 07/29/2014   Procedure: IRRIGATION AND DEBRIDEMENT PERIRECTAL ABSCESS;  Surgeon: Doreen Salvage, MD;  Location: [facility];  Service: General;  Laterality: Left;  Prone position   INSERT / REPLACE / REMOVE PACEMAKER  02/12/2011   SJM implanted by Dr Rayann Heman for Mobitz II second degree AV block and syncope   IRRIGATION AND DEBRIDEMENT ABSCESS N/A 07/27/2013   Procedure: IRRIGATION AND DEBRIDEMENT OF SKIN, SOFT TISSUE AND MUSCLES OF UPPER BACK (11X19X4cm) WITH 10 BLADE AND PULSATILE LAVAGE ;  Surgeon: Gayland Curry, MD;  Location: [facility];  Service: General;  Laterality: N/A;   PPM GENERATOR CHANGEOUT N/A 05/22/2019   Procedure: PPM GENERATOR CHANGEOUT;  Surgeon: Thompson Grayer, MD;  Location: [facility];  Service: Cardiovascular;  Laterality: N/A;   UMBILICAL HERNIA REPAIR  03/22/2012   Procedure: HERNIA REPAIR UMBILICAL ADULT;  Surgeon: Leighton Ruff, MD;  Location: [facility];  Service: General;  Laterality: N/A;  Umbilical Hernia Repair     Allergies  Allergen Reactions   Lisinopril Anaphylaxis   Shellfish Allergy Hives and Swelling     Physical Exam: General: The patient  is alert and oriented x3 in no acute distress.  Dermatology: Skin is warm, dry and supple bilateral lower extremities. Negative for open lesions or macerations.  Vascular: Palpable pedal pulses bilaterally.  Patient does have chronic bilateral lower extremity lymphedema to the legs and feet  Neurological: Light touch and protective threshold diminished  Musculoskeletal Exam: Pes planovalgus deformity noted to the bilateral feet with weightbearing especially  Assessment: 1.  Diabetes mellitus with peripheral polyneuropathy bilateral 2.  Chronic bilateral lower extremity  lymphedema 3.  Pes planus deformity bilateral  Plan of Care:  1. Patient evaluated.  Comprehensive diabetic foot exam performed today 2.  Appointment with Pedorthist for new diabetic shoes and insoles 3.  Return to clinic annually      Edrick Kins, DPM [facility]  Dr. Edrick Kins, DPM    [address], [place] [address]                Office [phone]  Fax [phone]

## 2021-07-10 ENCOUNTER — Other Ambulatory Visit: Payer: Self-pay

## 2021-07-10 ENCOUNTER — Ambulatory Visit: Payer: Medicare HMO

## 2021-07-10 ENCOUNTER — Encounter: Payer: Self-pay | Admitting: Internal Medicine

## 2021-07-10 DIAGNOSIS — E0843 Diabetes mellitus due to underlying condition with diabetic autonomic (poly)neuropathy: Secondary | ICD-10-CM

## 2021-07-10 DIAGNOSIS — M2141 Flat foot [pes planus] (acquired), right foot: Secondary | ICD-10-CM

## 2021-07-10 NOTE — Progress Notes (Signed)
SITUATION Reason for Consult: Evaluation for Prefabricated Diabetic Shoes and Bilateral Custom Diabetic Inserts. Patient / Caregiver Report: Patient would like well fitting shoes  OBJECTIVE DATA: Patient History / Diagnosis:    ICD-10-CM   1. Diabetes mellitus due to underlying condition with diabetic autonomic neuropathy, unspecified whether long term insulin use (HCC)  E08.43     2. Pes planus of both feet  M21.41    M21.42       Current or Previous Devices:   None and no history  In-Person Foot Examination: Ulcers & Callousing:   None  Toe / Foot Deformities:   - Pes Cavovarus   Shoe Size: 12XW  ORTHOTIC RECOMMENDATION Recommended Devices: - 1x pair prefabricated PDAC approved diabetic shoes: Patient selected [id number] - 3x pair custom-to-patient vacuum formed diabetic insoles.   GOALS OF SHOES AND INSOLES - Reduce shear and pressure - Reduce / Prevent callus formation - Reduce / Prevent ulceration - Protect the fragile healing compromised diabetic foot.  Patient would benefit from diabetic shoes and inserts as patient has diabetes mellitus and the patient has one or more of the following conditions: - History of partial or complete amputation of the foot - History of previous foot ulceration. - History of pre-ulcerative callus - Peripheral neuropathy with evidence of callus formation - Foot deformity - Poor circulation  ACTIONS PERFORMED Patient was casted for insoles via crush box and measured for shoes via brannock device. Procedure was explained and patient tolerated procedure well. All questions were answered and concerns addressed.  PLAN Patient is to ensure treating physician receives and completes diabetic paperwork. Casts and shoe order are to be held until paperwork is received. Once received patient is to be scheduled for fitting in four weeks.

## 2021-07-12 ENCOUNTER — Encounter: Payer: Self-pay | Admitting: Internal Medicine

## 2021-07-15 DIAGNOSIS — E7521 Fabry (-Anderson) disease: Secondary | ICD-10-CM | POA: Diagnosis not present

## 2021-07-27 ENCOUNTER — Other Ambulatory Visit: Payer: Self-pay | Admitting: Internal Medicine

## 2021-07-27 DIAGNOSIS — I10 Essential (primary) hypertension: Secondary | ICD-10-CM

## 2021-07-27 DIAGNOSIS — G463 Brain stem stroke syndrome: Secondary | ICD-10-CM

## 2021-07-27 DIAGNOSIS — R3589 Other polyuria: Secondary | ICD-10-CM

## 2021-07-28 NOTE — Telephone Encounter (Signed)
Requesting early, all filled for 3 month supply.. Requested Prescriptions  Pending Prescriptions Disp Refills   doxazosin (CARDURA) 2 MG tablet [Pharmacy Med Name: DOXAZOSIN MESYLATE 2 MG TAB] 45 tablet 0    Sig: TAKE 1/2 TABLET BY MOUTH EVERY DAY     Cardiovascular:  Alpha Blockers Passed - 07/27/2021  4:34 PM      Passed - Last BP in normal range    BP Readings from Last 1 Encounters:  11/07/20 130/81         Passed - Valid encounter within last 6 months    Recent Outpatient Visits          1 month ago Type 2 diabetes mellitus with other circulatory complication, with long-term current use of insulin ([place])   [place] Karle Plumber B, MD   5 months ago Need for vaccination against Streptococcus pneumoniae   [place], Annie Main L, RPH-CPP   5 months ago Type 2 diabetes mellitus with other circulatory complication, with long-term current use of insulin ([place])   [place], Neoma Laming B, MD   8 months ago Type 2 diabetes mellitus with other circulatory complication, with long-term current use of insulin ([place])   [place] Ladell Pier, MD   9 months ago Encounter for [employer] annual wellness exam   [place], MD      Future Appointments            In 3 months Ladell Pier, MD [place]            amLODipine (NORVASC) 10 MG tablet [Pharmacy Med Name: AMLODIPINE BESYLATE 10 MG TAB] 90 tablet 0    Sig: TAKE 1 TABLET BY MOUTH EVERY DAY     Cardiovascular: Calcium Channel Blockers 2 Passed - 07/27/2021  4:34 PM      Passed - Last BP in normal range    BP Readings from Last 1 Encounters:  11/07/20 130/81         Passed - Last Heart Rate in normal range    Pulse Readings from Last 1 Encounters:  11/07/20 60         Passed - Valid encounter  within last 6 months    Recent Outpatient Visits          1 month ago Type 2 diabetes mellitus with other circulatory complication, with long-term current use of insulin ([place])   [place] Karle Plumber B, MD   5 months ago Need for vaccination against Streptococcus pneumoniae   [place], Annie Main L, RPH-CPP   5 months ago Type 2 diabetes mellitus with other circulatory complication, with long-term current use of insulin [facility])   [place], Neoma Laming B, MD   8 months ago Type 2 diabetes mellitus with other circulatory complication, with long-term current use of insulin [facility])   [place] Ladell Pier, MD   9 months ago Encounter for [employer] annual wellness exam   [place], MD      Future Appointments            In 3 months Wynetta Emery, Dalbert Batman, MD [place]  valsartan-hydrochlorothiazide (DIOVAN-HCT) 80-12.5 MG tablet [Pharmacy Med Name: VALSARTAN-HCTZ 80-12.5 MG TAB] 90 tablet 0    Sig: TAKE 1 TABLET BY MOUTH DAILY. THIS IS TO REPLACE LOSARTAN/HCTZ     Cardiovascular: ARB + Diuretic Combos Failed - 07/27/2021  4:34 PM      Failed - Na in normal range and within 180 days    Sodium  Date Value Ref Range Status  02/23/2021 146 (H) 134 - 144 mmol/L Final         Failed - Cr in normal range and within 180 days    Creat  Date Value Ref Range Status  11/07/2014 1.28 0.50 - 1.35 mg/dL Final   Creatinine, Ser  Date Value Ref Range Status  02/23/2021 1.88 (H) 0.76 - 1.27 mg/dL Final   Creatinine, Urine  Date Value Ref Range Status  08/30/2014 72.9 mg/dL Final    Comment:    No reference range established.         Passed - K in normal range and within 180 days    Potassium  Date Value Ref Range Status  02/23/2021 4.7 3.5 - 5.2 mmol/L  Final         Passed - eGFR is 10 or above and within 180 days    GFR, Est African American  Date Value Ref Range Status  11/07/2014 73 mL/min Final   GFR calc Af Amer  Date Value Ref Range Status  07/08/2020 42 (L) >59 mL/min/1.73 Final    Comment:    **In accordance with recommendations from the NKF-ASN Task force,**   [facility] is in the process of updating its eGFR calculation to the   2021 CKD-EPI creatinine equation that estimates kidney function   without a race variable.    GFR, Est Non African American  Date Value Ref Range Status  11/07/2014 63 mL/min Final    Comment:      The estimated GFR is a calculation valid for adults (>=76 years old) that uses the CKD-EPI algorithm to adjust for age and sex. It is   not to be used for children, pregnant women, hospitalized patients,    patients on dialysis, or with rapidly changing kidney function. According to the NKDEP, eGFR >89 is normal, 60-89 shows mild impairment, 30-59 shows moderate impairment, 15-29 shows severe impairment and <15 is ESRD.      GFR calc non Af Amer  Date Value Ref Range Status  07/08/2020 36 (L) >59 mL/min/1.73 Final   eGFR  Date Value Ref Range Status  02/23/2021 40 (L) >59 mL/min/1.73 Final         Passed - Patient is not pregnant      Passed - Last BP in normal range    BP Readings from Last 1 Encounters:  11/07/20 130/81         Passed - Valid encounter within last 6 months    Recent Outpatient Visits          1 month ago Type 2 diabetes mellitus with other circulatory complication, with long-term current use of insulin ([place])   [place] Karle Plumber B, MD   5 months ago Need for vaccination against Streptococcus pneumoniae   [place], Annie Main L, RPH-CPP   5 months ago Type 2 diabetes mellitus with other circulatory complication, with long-term current use of insulin [facility])   [place] Ladell Pier, MD   8 months ago  Type 2 diabetes mellitus with other circulatory complication, with long-term current use of insulin ([place])   [place] Ladell Pier, MD   9 months ago Encounter for [employer] annual wellness exam   [place], MD      Future Appointments            In 3 months Ladell Pier, MD [place]            clopidogrel (PLAVIX) 75 MG tablet [Pharmacy Med Name: CLOPIDOGREL 75 MG TABLET] 90 tablet 2    Sig: TAKE 1 TABLET BY [place]     Hematology: Antiplatelets - clopidogrel Failed - 07/27/2021  4:34 PM      Failed - Cr in normal range and within 360 days    Creat  Date Value Ref Range Status  11/07/2014 1.28 0.50 - 1.35 mg/dL Final   Creatinine, Ser  Date Value Ref Range Status  02/23/2021 1.88 (H) 0.76 - 1.27 mg/dL Final   Creatinine, Urine  Date Value Ref Range Status  08/30/2014 72.9 mg/dL Final    Comment:    No reference range established.         Passed - HCT in normal range and within 180 days    Hematocrit  Date Value Ref Range Status  02/23/2021 44.5 37.5 - 51.0 % Final         Passed - HGB in normal range and within 180 days    Hemoglobin  Date Value Ref Range Status  02/23/2021 15.0 13.0 - 17.7 g/dL Final   Total hemoglobin  Date Value Ref Range Status  07/26/2013 14.2 13.5 - 18.0 g/dL Final         Passed - PLT in normal range and within 180 days    Platelets  Date Value Ref Range Status  02/23/2021 198 150 - 450 x10E3/uL Final         Passed - Valid encounter within last 6 months    Recent Outpatient Visits          1 month ago Type 2 diabetes mellitus with other circulatory complication, with long-term current use of insulin ([place])   [place] Karle Plumber B, MD   5 months ago Need for vaccination against Streptococcus pneumoniae    [place], Annie Main L, RPH-CPP   5 months ago Type 2 diabetes mellitus with other circulatory complication, with long-term current use of insulin [facility])   [place], Neoma Laming B, MD   8 months ago Type 2 diabetes mellitus with other circulatory complication, with long-term current use of insulin [facility])   [place] Ladell Pier, MD   9 months ago Encounter for [employer] annual wellness exam   [place], MD      Future Appointments            In 3 months Wynetta Emery, Dalbert Batman, MD [place]

## 2021-07-29 DIAGNOSIS — E7521 Fabry (-Anderson) disease: Secondary | ICD-10-CM | POA: Diagnosis not present

## 2021-08-12 DIAGNOSIS — E7521 Fabry (-Anderson) disease: Secondary | ICD-10-CM | POA: Diagnosis not present

## 2021-08-13 ENCOUNTER — Telehealth: Payer: Self-pay

## 2021-08-13 NOTE — Telephone Encounter (Signed)
Shoes Ordered - [employer] [phone] [id number] ?

## 2021-08-13 NOTE — Telephone Encounter (Signed)
Casts sent to [employer] ?

## 2021-08-26 DIAGNOSIS — E7521 Fabry (-Anderson) disease: Secondary | ICD-10-CM | POA: Diagnosis not present

## 2021-09-02 ENCOUNTER — Telehealth: Payer: Self-pay | Admitting: Internal Medicine

## 2021-09-02 DIAGNOSIS — Z794 Long term (current) use of insulin: Secondary | ICD-10-CM

## 2021-09-02 NOTE — Telephone Encounter (Signed)
I received a message from [name] with [facility] stating that patient has an upcoming appointment but his referral has expired.  I will need to enter a new order in epic.  Order submitted. ?

## 2021-09-08 ENCOUNTER — Telehealth: Payer: Self-pay

## 2021-09-08 NOTE — Telephone Encounter (Signed)
LVM that the patients shoes and inserts have come in. Unfortunately the auth has expired because the pt has not been seen by treating physician in over 6 months. Advised pt in message to call to schedule appt or we could resend for his appt 10/26/2021 with PCP.

## 2021-09-09 DIAGNOSIS — E7521 Fabry (-Anderson) disease: Secondary | ICD-10-CM | POA: Diagnosis not present

## 2021-09-12 ENCOUNTER — Encounter: Payer: Self-pay | Admitting: Internal Medicine

## 2021-09-14 ENCOUNTER — Encounter: Payer: Self-pay | Admitting: Internal Medicine

## 2021-09-21 ENCOUNTER — Ambulatory Visit (INDEPENDENT_AMBULATORY_CARE_PROVIDER_SITE_OTHER): Payer: Medicare HMO

## 2021-09-21 DIAGNOSIS — I441 Atrioventricular block, second degree: Secondary | ICD-10-CM | POA: Diagnosis not present

## 2021-09-23 LAB — CUP PACEART REMOTE DEVICE CHECK
Battery Remaining Longevity: 64 mo
Battery Remaining Percentage: 70 %
Battery Voltage: 2.99 V
Brady Statistic AP VP Percent: 70 %
Brady Statistic AP VS Percent: 1 %
Brady Statistic AS VP Percent: 30 %
Brady Statistic AS VS Percent: 1 %
Brady Statistic RA Percent Paced: 70 %
Brady Statistic RV Percent Paced: 99 %
Date Time Interrogation Session: 20230417042843
Implantable Lead Implant Date: 20120907
Implantable Lead Implant Date: 20120907
Implantable Lead Location: 753859
Implantable Lead Location: 753860
Implantable Pulse Generator Implant Date: 20201215
Lead Channel Impedance Value: 340 Ohm
Lead Channel Impedance Value: 380 Ohm
Lead Channel Pacing Threshold Amplitude: 0.625 V
Lead Channel Pacing Threshold Amplitude: 1.75 V
Lead Channel Pacing Threshold Pulse Width: 0.5 ms
Lead Channel Pacing Threshold Pulse Width: 0.8 ms
Lead Channel Sensing Intrinsic Amplitude: 5 mV
Lead Channel Sensing Intrinsic Amplitude: 6.4 mV
Lead Channel Setting Pacing Amplitude: 1.625
Lead Channel Setting Pacing Amplitude: 2 V
Lead Channel Setting Pacing Pulse Width: 0.8 ms
Lead Channel Setting Sensing Sensitivity: 4 mV
Pulse Gen Model: 2272
Pulse Gen Serial Number: 9187005

## 2021-09-24 ENCOUNTER — Encounter: Payer: Self-pay | Admitting: Internal Medicine

## 2021-09-24 DIAGNOSIS — E7521 Fabry (-Anderson) disease: Secondary | ICD-10-CM | POA: Diagnosis not present

## 2021-09-30 DIAGNOSIS — H02831 Dermatochalasis of right upper eyelid: Secondary | ICD-10-CM | POA: Diagnosis not present

## 2021-09-30 DIAGNOSIS — H1045 Other chronic allergic conjunctivitis: Secondary | ICD-10-CM | POA: Diagnosis not present

## 2021-09-30 DIAGNOSIS — I639 Cerebral infarction, unspecified: Secondary | ICD-10-CM | POA: Diagnosis not present

## 2021-09-30 DIAGNOSIS — E7521 Fabry (-Anderson) disease: Secondary | ICD-10-CM | POA: Diagnosis not present

## 2021-09-30 DIAGNOSIS — H02834 Dermatochalasis of left upper eyelid: Secondary | ICD-10-CM | POA: Diagnosis not present

## 2021-09-30 DIAGNOSIS — H02423 Myogenic ptosis of bilateral eyelids: Secondary | ICD-10-CM | POA: Diagnosis not present

## 2021-09-30 DIAGNOSIS — Z794 Long term (current) use of insulin: Secondary | ICD-10-CM | POA: Diagnosis not present

## 2021-09-30 DIAGNOSIS — E119 Type 2 diabetes mellitus without complications: Secondary | ICD-10-CM | POA: Diagnosis not present

## 2021-09-30 DIAGNOSIS — H35033 Hypertensive retinopathy, bilateral: Secondary | ICD-10-CM | POA: Diagnosis not present

## 2021-09-30 DIAGNOSIS — H2511 Age-related nuclear cataract, right eye: Secondary | ICD-10-CM | POA: Diagnosis not present

## 2021-10-01 ENCOUNTER — Encounter: Payer: Medicare HMO | Attending: Internal Medicine | Admitting: Dietician

## 2021-10-01 ENCOUNTER — Encounter: Payer: Self-pay | Admitting: Dietician

## 2021-10-01 DIAGNOSIS — Z713 Dietary counseling and surveillance: Secondary | ICD-10-CM | POA: Insufficient documentation

## 2021-10-01 DIAGNOSIS — E1159 Type 2 diabetes mellitus with other circulatory complications: Secondary | ICD-10-CM | POA: Insufficient documentation

## 2021-10-01 DIAGNOSIS — Z794 Long term (current) use of insulin: Secondary | ICD-10-CM | POA: Insufficient documentation

## 2021-10-01 DIAGNOSIS — E1169 Type 2 diabetes mellitus with other specified complication: Secondary | ICD-10-CM

## 2021-10-01 DIAGNOSIS — N1832 Chronic kidney disease, stage 3b: Secondary | ICD-10-CM

## 2021-10-01 NOTE — Patient Instructions (Signed)
Consistency with exercise.  ? Gym 2-3 times per week  ?  Consider bike or other exercises  ? What would help with your motivation? ?  "An item in motion stays in motion?" ? Work in your yard ? ?1 portion of meat rather than 2 ?Try "True Lemon or True Lime" ?Drink more water and only very small amounts of juice. ?    Continue to choose beverages without sugar. ?Be mindful about the food choices that you make. ?         ?            Decreasing fat intake. ?            Decreasing salt.  (Less canned beans -or rinse beans, avoid processed meat such as sausage, bacon) ? Smaller portion of cereal or less often. ? Stop when satisfied. ?

## 2021-10-01 NOTE — Progress Notes (Signed)
?Medical Nutrition Therapy  ?Appointment Start time:  [place] time:  0950 ?Patient is here today alone.  He was last seen by this RD 07/03/2021 ?Primary concerns today: He would like to lose weight. ?Referral diagnosis: Type 2 Diabetes, obesity ?Preferred learning style: no preference indicated ?Learning readiness: ready ? ? ?NUTRITION ASSESSMENT  ?Patient is here today alone.  He was last seen by this RD 10/10/2020. ?Eating more unsalted almonds in front of the TV. ?He went to his nephrologist this week and had labs drawn - results unknown and states that he has an upcoming appointment. ?He has a prescription for Dexcom.  Called about it and it [place] Falling states that it is over $500 per month.  Discussed that he should be able to obtain this for less money or consider the [employer]. ?Lacking motivation to exercise. ?Drinking more water but states that infusion site is still thinking that he is dehydrated. ?New Years Resolution:  avoid regular soda, fruit juice, decrease diet soda ?Eating dinner off a saucer (smaller portion). ?Fun food Friday once per month (Pizza or [place]) - portion size is very large and needs to portion this.   ?Eating more fresh vegetables.   ?Less fried foods, less potatoes  ?Dinner before 7 ? ?He walks with a cane.   He has [employer] research officer, government.   ?Started walking at the gym on the treadmill.  He is concerned with falling.  He does work outside in his yard. ? ?Anthropometrics  ?290 lbs 10/01/2021 ?294 lbs 07/03/2021 with leather coat ?287 lbs 01/02/2021 ?284 lbs 11/07/2020 ?288 lbs 11/06/2020 ?292 lbs 10/10/2020 ?298 lbs 08/15/2020 ?299 lbs 07/08/2020 ? ?Clinical ?Medical Hx: History includes:  Type 2 diabetes (2010) with microabluminuria, CKD stage 3, glaucoma, Fabry's disease (manifests as lymphedema), CVA, HTN, CHF, pacemaker ?Medications: see list to include:  Novolog 70/30 24 units bid ?Labs: A1C 7.7% ( 11/07/2020), 7.8% (07/08/20) increased from 7.4% 01/2020, cholesterol 165, HDL 63, LDL 89,  triglycerides 69 07/08/20, GFR 42 (07/08/20)  ? ?Lifestyle & Dietary Hx ?Patient lives alone.  He has started to cook his foods fresh more frequently.  He states that he bakes most of his meats.  He does his own shopping. ?He is retired due to his lymphedema.  He worked for [employer] and then [employer]. ?He walks with a cane.  Reports increased balance issues.  This has been since his stroke.   ?He is wearing compression socks and wraps on both feet for the lymphedema. ? ?Avoids Bananas as his potassium has been elevated in the past. ?Avoids added salt.  Uses Mrs. Dash or hot sauce. ?Avoids cookies. ?Reduced may. ?Choosing LS [place] and avoiding ham and other high sodium ? ?Estimated daily fluid intake: 64 oz ?Supplements: none ?Sleep: States that he sleep late as he is fearful that a tree will fall on his house (no more than 5 hours per night plus naps during the day).  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  He does some squats and other exercises including walking in his home. ? ?24-Hr Dietary Recall ?First Meal: plain cheerios or cornflakes with almond milk or 1% milk, strawberries, 2 slices toast, 1 pat butter, occasional breakfast meat (2 patties) ?Snack: none ?Second Meal: skips OR cooked vegetables or tomato cucumber salad with [place] or eggs OR tuna with crackers ?Snack: none ?Third  Meal:  LS [place] sandwich, small amount mayo, fruit ?Snack:  ?Beverages: water, almond milk, 1% milk, rare juice, 8 oz sweet tea occasionally, rare regular soda ? ? ?NUTRITION DIAGNOSIS  ?NB-1.1 Food and nutrition-related knowledge deficit As related to balance of carbohydrate, protein, and fat.  As evidenced by diet hx and patient report. ? ? ?NUTRITION INTERVENTION  ?Nutrition education (E-1) on the following topics:  ?Reviewed habits and provided encouragement for changes  made ?Benefits of exercise ?Lower sodium reviewed ?Small portions meat ?Eating increased vegetables (unless potassium becomes elevated) ?Blood glucose monitoring options ?Calorie density (limit nuts to small portion daily) ? ?Handouts Provided Include - 07/2020 visit ?NKD national kidney diet for those not on dialysis ?Meal plan card ?My Plate (02/7672) ?CKD Nutrition therapy stages 3-5 (not on dialysis) from AND (09/2021 visit) ? ?Learning Style & Readiness for Change ?Teaching method utilized: Visual & Auditory  ?Demonstrated degree of understanding via: Teach Back  ?Barriers to learning/adherence to lifestyle change: lymphadema ? ?Goals ?Consistency with exercise.  ? Gym 2-3 times per week  ?  Consider bike or other exercises  ? What would help with your motivation? ?  "An item in motion stays in motion?" ? Work in your yard ? ?1 portion of meat rather than 2 ?Try "True Lemon or True Lime" ?Drink more water and only very small amounts of juice. ?    Continue to choose beverages without sugar. ?Be mindful about the food choices that you make. ?         ?            Decreasing fat intake. ?            Decreasing salt.  (Less canned beans -or rinse beans, avoid processed meat such as sausage, bacon) ? Smaller portion of cereal or less often. ? Stop when satisfied. ? ? ?MONITORING & EVALUATION ?Dietary intake, weekly physical activity, and label reading in 5 months. ? ?Next Steps  ?Patient is to call or MyChart for questions. ? ? ?

## 2021-10-07 ENCOUNTER — Encounter: Payer: Self-pay | Admitting: Internal Medicine

## 2021-10-08 ENCOUNTER — Encounter: Payer: Self-pay | Admitting: Internal Medicine

## 2021-10-08 ENCOUNTER — Other Ambulatory Visit: Payer: Self-pay | Admitting: Internal Medicine

## 2021-10-08 DIAGNOSIS — E7521 Fabry (-Anderson) disease: Secondary | ICD-10-CM | POA: Diagnosis not present

## 2021-10-08 NOTE — Progress Notes (Signed)
Remote pacemaker transmission.   

## 2021-10-14 ENCOUNTER — Other Ambulatory Visit (HOSPITAL_COMMUNITY): Payer: Self-pay | Admitting: Internal Medicine

## 2021-10-14 ENCOUNTER — Telehealth: Payer: Self-pay

## 2021-10-14 DIAGNOSIS — E7521 Fabry (-Anderson) disease: Secondary | ICD-10-CM

## 2021-10-14 NOTE — Telephone Encounter (Signed)
IR order needs to be co signed in epic for patient ?

## 2021-10-15 ENCOUNTER — Other Ambulatory Visit: Payer: Self-pay | Admitting: Radiology

## 2021-10-15 NOTE — Telephone Encounter (Signed)
Patient returned call to [facility] regarding getting a port put in can be reached at Ph# [phone] ?

## 2021-10-16 ENCOUNTER — Other Ambulatory Visit (HOSPITAL_COMMUNITY): Payer: Self-pay | Admitting: Physician Assistant

## 2021-10-19 ENCOUNTER — Ambulatory Visit (HOSPITAL_COMMUNITY)
Admission: RE | Admit: 2021-10-19 | Discharge: 2021-10-19 | Disposition: A | Discharge status: Home / Self Care | Payer: Medicare HMO | Source: Ambulatory Visit | Attending: Internal Medicine | Admitting: Internal Medicine

## 2021-10-19 ENCOUNTER — Telehealth (HOSPITAL_COMMUNITY): Payer: Self-pay

## 2021-10-19 ENCOUNTER — Encounter (HOSPITAL_COMMUNITY): Payer: Self-pay

## 2021-10-19 DIAGNOSIS — Z539 Procedure and treatment not carried out, unspecified reason: Secondary | ICD-10-CM | POA: Diagnosis not present

## 2021-10-19 DIAGNOSIS — E7521 Fabry (-Anderson) disease: Secondary | ICD-10-CM | POA: Insufficient documentation

## 2021-10-19 LAB — GLUCOSE, CAPILLARY: Glucose-Capillary: 83 mg/dL (ref 70–99)

## 2021-10-19 MED ORDER — SODIUM CHLORIDE 0.9 % IV SOLN: INTRAVENOUS | Status: DC

## 2021-10-19 NOTE — Telephone Encounter (Signed)
Called to reschedule port a cath, no answer, left vm. AW  ?

## 2021-10-19 NOTE — Progress Notes (Signed)
? ?  PAC was scheduled for today ?Unfortunately, pt has come to hospital via ambulance and intended to go home via ambulance ? ?He does not have anyone to be with him at home at all today. ?Didn't realize this procedure was performed using sedation and the requirements for after care. ? ?I did speak to him about procedure and what all it entails. ?Offered procedure to be done today WITHOUT sedation and he declined. ? ?He is agreeable to reschedule and have a ride back and forth arranged. ?Asked him to have someone that could stay with him a few hours after procedure-- which he will do. ? ?He will hear from IR scheduler ?He is agreeable to plan ? ?. ? ? ?

## 2021-10-21 DIAGNOSIS — E7521 Fabry (-Anderson) disease: Secondary | ICD-10-CM | POA: Diagnosis not present

## 2021-10-26 ENCOUNTER — Encounter: Payer: Self-pay | Admitting: Internal Medicine

## 2021-10-26 ENCOUNTER — Telehealth (HOSPITAL_COMMUNITY): Payer: Self-pay

## 2021-10-26 ENCOUNTER — Telehealth: Payer: Self-pay | Admitting: Internal Medicine

## 2021-10-26 ENCOUNTER — Ambulatory Visit: Payer: Medicare HMO | Attending: Internal Medicine | Admitting: Internal Medicine

## 2021-10-26 VITALS — BP 130/85 | HR 64 | Ht 70.0 in | Wt 287.0 lb

## 2021-10-26 DIAGNOSIS — M2141 Flat foot [pes planus] (acquired), right foot: Secondary | ICD-10-CM | POA: Diagnosis not present

## 2021-10-26 DIAGNOSIS — I129 Hypertensive chronic kidney disease with stage 1 through stage 4 chronic kidney disease, or unspecified chronic kidney disease: Secondary | ICD-10-CM | POA: Diagnosis not present

## 2021-10-26 DIAGNOSIS — I5032 Chronic diastolic (congestive) heart failure: Secondary | ICD-10-CM

## 2021-10-26 DIAGNOSIS — E1122 Type 2 diabetes mellitus with diabetic chronic kidney disease: Secondary | ICD-10-CM

## 2021-10-26 DIAGNOSIS — E785 Hyperlipidemia, unspecified: Secondary | ICD-10-CM

## 2021-10-26 DIAGNOSIS — E1159 Type 2 diabetes mellitus with other circulatory complications: Secondary | ICD-10-CM

## 2021-10-26 DIAGNOSIS — R0982 Postnasal drip: Secondary | ICD-10-CM | POA: Diagnosis not present

## 2021-10-26 DIAGNOSIS — Z794 Long term (current) use of insulin: Secondary | ICD-10-CM

## 2021-10-26 DIAGNOSIS — M2142 Flat foot [pes planus] (acquired), left foot: Secondary | ICD-10-CM

## 2021-10-26 DIAGNOSIS — E1142 Type 2 diabetes mellitus with diabetic polyneuropathy: Secondary | ICD-10-CM | POA: Diagnosis not present

## 2021-10-26 DIAGNOSIS — E1169 Type 2 diabetes mellitus with other specified complication: Secondary | ICD-10-CM | POA: Diagnosis not present

## 2021-10-26 DIAGNOSIS — N1832 Chronic kidney disease, stage 3b: Secondary | ICD-10-CM | POA: Diagnosis not present

## 2021-10-26 DIAGNOSIS — Z6841 Body Mass Index (BMI) 40.0 and over, adult: Secondary | ICD-10-CM

## 2021-10-26 LAB — POCT GLYCOSYLATED HEMOGLOBIN (HGB A1C): HbA1c, POC (controlled diabetic range): 9.7 % — AB (ref 0.0–7.0)

## 2021-10-26 LAB — GLUCOSE, POCT (MANUAL RESULT ENTRY): POC Glucose: 109 mg/dl — AB (ref 70–99)

## 2021-10-26 MED ORDER — RYBELSUS 3 MG PO TABS: 3.0000 mg | ORAL_TABLET | Freq: Every day | ORAL | 4 refills | Status: DC

## 2021-10-26 NOTE — Telephone Encounter (Signed)
-----   Message from Edrick Kins, DPM sent at 10/26/2021  5:12 PM EDT ----- Regarding: Diabetic shoe dispensable Hello Dr. Wynetta Emery,  I just forwarded this message to our diabetic shoe department.  I think there needs to be form completed by your office to qualify the patient for the diabetic shoes. I cc'd Ifeoluwa Beller who works in our diabetic shoe department and hopefully she can get some clarity. Thanks much, Daylene Katayama  ----- Message ----- From: Ladell Pier, MD Sent: 10/26/2021   5:11 PM EDT To: Edrick Kins, DPM  I am the PCP for this pt. Your office had ordered diabetic shoes for this patient.  He says that the shoes came in and your office could not release them until he had visit with me his PCP.  I saw him today. Would he be able to get his shoes?  Thanks.

## 2021-10-26 NOTE — Progress Notes (Signed)
Patient ID: ALDAN CAMEY, male    DOB: Feb 27, 1961  MRN: [id number]  CC: Chronic disease management  Subjective: [name] is a 61 y.o. male who presents for chronic disease management His concerns today include:  Patient with history of diabetes type 2 with microalbuminuria, diastolic CHF with pacemaker (secconary to AV block with syncopy), CKD stage III, HOCM, HTN, HL, CVA, lymphedema. 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   DIABETES TYPE 2/Obesity Last A1C:   Results for orders placed or performed in visit on 10/26/21  POCT glucose (manual entry)  Result Value Ref Range   POC Glucose 109 (A) 70 - 99 mg/dl  POCT glycosylated hemoglobin (Hb A1C)  Result Value Ref Range   Hemoglobin A1C     HbA1c POC (<> result, manual entry)     HbA1c, POC (prediabetic range)     HbA1c, POC (controlled diabetic range) 9.7 (A) 0.0 - 7.0 %  Pt surprise today at his A1C level Med Adherence:  '[x]'$  Yes he is on NovoLog 70/30 24 units twice a day. He had sent me a Mychart message a few mths ago about wanting to be tried on Ozempic Medication side effects:  '[]'$  Yes    '[x]'$  No Home Monitoring?  '[x]'$  Yes .  Dexcom ordered on last visit but too expensive even with insurance Home glucose results range.  Checking BID:  before BF range 85-100, before dinner 122-125.   Diet Adherence: '[x]'$  Yes.  He has been seeing the registered dietitian and finding it helpful.  Pt thinks he is doing all the right things with portion sizes, not drinking juices, no sugar in his cereal Exercise: '[x]'$  Yes -2 days a wk he is doing chair yoga and stretching at [employer].  Apprehensive of getting in pool because small size and concern about germs Hypoglycemic episodes?: '[]'$  Yes    '[x]'$  No Numbness of the feet? '[]'$  Yes    '[x]'$  No.  Saw podiatrist Dr. Daylene Katayama and was diagnosed with his plantars and diabetic neuropathy.  Prescribed diabetic shoes.  Issues came in already but they are waiting for visit  today with PCP Retinopathy hx? '[]'$  Yes    '[x]'$  No Last eye exam: Up-to-date.  Scheduled for cataract extraction on 10/29/2021.    HTN/CKD 3/diastolic CHF Currently taking: see medication list.  Did not take meds as yet for this a.m.  Has cataract extraction 10/29/21.  Reports being told to hold all oral meds 2-5 days. Med Adherence: '[x]'$  Yes.  He confirms taking Diovan/HCTZ 80/12.5 mg once a day, Cardura 2 mg half a tablet daily, Norvasc 10 mg daily, furosemide 20 mg daily Medication side effects: '[]'$  Yes    '[x]'$  No Adherence with salt restriction: '[x]'$  Yes    '[]'$  No Home Monitoring?: '[]'$  Yes    '[]'$  No Monitoring Frequency:  Home BP results range:  SOB? '[]'$  Yes    '[x]'$  No Chest Pain?: '[]'$  Yes    '[x]'$  No Leg swelling?: '[]'$  Yes    '[x]'$  No Headaches?: '[]'$  Yes    '[x]'$  No Dizziness? '[]'$  Yes    '[]'$  No Comments:   Gets "coughing fits"  intermittently for over 1 yr.  Associated with sneezing and clearing throat.  Endorses drainage at back of throat.  Using Flonase.  Takes Allegra only with his infusion treatments.   HL: Reports compliance with Lipitor 80 mg daily.  Fabry Ds: He gets Fabrazyme infusions every 2 weeks.  He has  become a hard stick.  Infusion center has recommended placement of a port. Went last wk to have it done but had to be cancelled because pt did not have someone to stay with him for a few hrs at home post procedure.   Patient Active Problem List   Diagnosis Date Noted   Chronic sinusitis 07/01/2021   Contact dermatitis and other eczema 07/01/2021   Edema 07/01/2021   Other and unspecified hyperlipidemia 07/01/2021   Proteinuria 07/01/2021   Macroalbuminuric diabetic nephropathy ([place]) 11/08/2020   Diabetes mellitus ([place]) 11/07/2020   Hypertension associated with stage 3a chronic kidney disease due to type 2 diabetes mellitus (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   CVA (cerebral vascular accident) ([place]) 10/20/2018   UTI (urinary tract infection) 10/19/2018   Upper airway cough syndrome 08/01/2018   Lymphedema 10/06/2016   Gout, arthritis    Anorectal fistula 06/23/2015   Onychomycosis of toenail 09/26/2014   Diabetes mellitus type 2 in obese ([place])    Iron deficiency anemia 07/29/2014   CKD (chronic kidney disease), stage III (HCC)    Chronic diastolic HF (heart failure) ([place]) 03/27/2013   Peripheral edema 07/20/2012   Obstructive sleep apnea, suspected 06/21/2012   Pacemaker-St.Jude 02/23/2012   Hypertension 08/09/2011   Mobitz type II atrioventricular block 05/19/2011   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[place]) 05/19/2011     Current Outpatient Medications on File Prior to Visit  Medication Sig Dispense Refill   Accu-Chek FastClix Lancets MISC 1 each by Other route 3 (three) times daily. 102 each 12   acetaminophen (TYLENOL) 325 MG tablet Take 1-2 tablets (325-650 mg total) by mouth every 4 (four) hours as needed for mild pain.     Agalsidase beta (FABRAZYME IV) Inject 115 mg into the vein every 14 (fourteen) days.      albuterol (PROAIR HFA) 108 (90 Base) MCG/ACT inhaler Inhale 1 puff into the lungs every 6 (six) hours as needed for wheezing or shortness of breath. 8.5 each 1   allopurinol (ZYLOPRIM) 100 MG tablet TAKE 3 TABLETS BY MOUTH EVERY DAY 270 tablet 1   amLODipine (NORVASC) 10 MG tablet TAKE 1 TABLET BY MOUTH EVERY DAY 90 tablet 0   atorvastatin (LIPITOR) 80 MG tablet TAKE 1 TABLET BY MOUTH DAILY AT 6 PM. 90 tablet 2   clopidogrel (PLAVIX) 75 MG tablet TAKE 1 TABLET BY MOUTH EVERY DAY 90 tablet 2   doxazosin (CARDURA) 2 MG tablet TAKE 1/2 TABLET BY MOUTH EVERY DAY 45 tablet 0   fexofenadine (ALLEGRA)  180 MG tablet Take 1 tablet (180 mg total) by mouth daily. 30 tablet 3   fluticasone (FLONASE) 50 MCG/ACT nasal spray PLACE 1 SPRAY INTO BOTH NOSTRILS DAILY AS NEEDED FOR ALLERGIES. 48 mL 1   furosemide (LASIX) 20 MG tablet TAKE 1 TABLET BY MOUTH EVERY DAY 90 tablet 0   glucose blood (ACCU-CHEK AVIVA PLUS) test strip 1 each by Other route 3 (three) times daily. Use as instructed 100 each 12   Insulin Pen Needle (PX SHORTLENGTH PEN NEEDLES) 31G X 8 MM MISC 1 each by Does not apply route 2 (two) times daily before a meal. 100 each  8   mupirocin ointment (BACTROBAN) 2 % Apply to affected area daily with dressing change. 30 g 0   NOVOLOG MIX 70/30 FLEXPEN (70-30) 100 UNIT/ML FlexPen INJECT 0.28 MLS (28 UNITS TOTAL) INTO THE SKIN 2 (TWO) TIMES DAILY. 15 mL 11   Prednisol Ace-Moxiflox-Bromfen 1-0.5-0.075 % SUSP Place 1 drop into the right eye 4 (four) times daily. Start 1 day before the surgery on 10/19/21     valsartan-hydrochlorothiazide (DIOVAN-HCT) 80-12.5 MG tablet TAKE 1 TABLET BY MOUTH DAILY. THIS IS TO REPLACE LOSARTAN/HCTZ 90 tablet 0   No current facility-administered medications on file prior to visit.    Allergies  Allergen Reactions   Lisinopril Anaphylaxis   Shellfish Allergy Hives and Swelling    Social History   Socioeconomic History   Marital status: Divorced    Spouse name: Not on file   Number of children: Not on file   Years of education: Not on file   Highest education level: Not on file  Occupational History   Not on file  Tobacco Use   Smoking status: Never   Smokeless tobacco: Never  Vaping Use   Vaping Use: Never used  Substance and Sexual Activity   Alcohol use: Yes    Comment: 07/23/2014 "might have a few drinks on holidays or at cookouts"   Drug use: No   Sexual activity: Yes  Other Topics Concern   Not on file  Social History Narrative   Coaches [employer]    Social Determinants of Health   Financial Resource Strain: Not on file  Food  Insecurity: Not on file  Transportation Needs: Not on file  Physical Activity: Not on file  Stress: Not on file  Social Connections: Not on file  Intimate Partner Violence: Not on file    Family History  Problem Relation Age of Onset   Stroke Mother    Fabry's disease Mother    Fabry's disease Brother    Colon cancer Neg Hx     Past Surgical History:  Procedure Laterality Date   CARDIAC CATHETERIZATION  03-12-2011  DR Hampton WITH LV CAVITY  APPEARANCE CONSISTANT WITH SIGNIFICANT APICAL HYPERTROPHY/ NORMAL LVSF / EF 55%/ EVIDENCE OF DIASTOLIC DYSFUNCTION WITH EDP OF 23-39mHg AFTER A-WAVE/ NORMAL CORONARY ARTERIES   EVALUATION UNDER ANESTHESIA WITH FISTULECTOMY N/A 06/23/2015   Procedure: EXAM UNDER ANESTHESIA WITH POSSIBLE FISTULOTOMY;  Surgeon: JJudeth Horn MD;  [facility]  Service: General;  Laterality: N/A;   HERNIA REPAIR  [id number]  Umbilical Hernia Repair   INCISION AND DRAINAGE PERIRECTAL ABSCESS Left 07/29/2014   Procedure: IRRIGATION AND DEBRIDEMENT PERIRECTAL ABSCESS;  Surgeon: JDoreen Salvage MD;  [facility]  Service: General;  Laterality: Left;  Prone position   INSERT / REPLACE / REMOVE PACEMAKER  02/12/2011   SJM implanted by Dr ARayann Hemanfor Mobitz II second degree AV block and syncope   IRRIGATION AND DEBRIDEMENT ABSCESS N/A 07/27/2013   Procedure: IRRIGATION AND DEBRIDEMENT OF SKIN, SOFT TISSUE AND MUSCLES OF UPPER BACK (11X19X4cm) WITH 10 BLADE AND PULSATILE LAVAGE ;  Surgeon: EGayland Curry MD;  [facility]  Service: General;  Laterality: N/A;   PPM GENERATOR CHANGEOUT N/A 05/22/2019   Procedure: PPM GENERATOR CHANGEOUT;  Surgeon: AThompson Grayer MD;  [facility];  Service: Cardiovascular;  Laterality: N/A;   UMBILICAL HERNIA REPAIR  03/22/2012   Procedure: HERNIA REPAIR UMBILICAL ADULT;  Surgeon: ALeighton Ruff MD;  Location: [facility];  Service: General;  Laterality: N/A;  Umbilical Hernia Repair     ROS: Review  of Systems Negative except as stated above  PHYSICAL EXAM: BP 130/85   Pulse 64   Ht '5\' 10"'$  (1.778 m)   Wt 287 lb (130.2 kg)   SpO2 99%   BMI 41.18 kg/m   Wt Readings from Last 3 Encounters:  10/26/21 287 lb (130.2 kg)  10/19/21 290 lb (131.5 kg)  01/02/21 287 lb (130.2 kg)    Physical Exam   General appearance - alert, well appearing, and in no distress.  He has his cane with him. Mental status - normal mood, behavior, speech, dress, motor activity, and thought processes Neck - supple, no significant adenopathy Chest - clear to auscultation, no wheezes, rales or rhonchi, symmetric air entry Heart - normal rate, regular rhythm, normal S1, S2, no murmurs, rubs, clicks or gallops Extremities -legs were wrapped.  Wrapping and Velcro support wrap removed.  Chronic lymphedema both legs. Skin -skin on both legs and feet dry and flaky. Diabetic Foot Exam - Simple   Simple Foot Form Visual Inspection See comments: Yes Sensation Testing See comments: Yes Pulse Check See comments: Yes Comments Skin on both feet are dry and flaky on the dorsal surface. Toenails are thick and slightly discolored. He is flat-footed.  No ulcers seen. Slight decreased sensation to light touch on the plantar surface of both feet. Dorsalis pedis and posterior tibialis pulses difficult to palpate due to soft tissue swelling on the dorsal surface of both feet.        Latest Ref Rng & Units 02/23/2021   10:26 AM 07/08/2020   10:48 AM 08/23/2019   11:52 AM  CMP  Glucose 65 - 99 mg/dL 169   163   147    BUN 8 - 27 mg/dL 27   23   32    Creatinine 0.76 - 1.27 mg/dL 1.88   1.96   2.02    Sodium 134 - 144 mmol/L 146   143   143    Potassium 3.5 - 5.2 mmol/L 4.7   4.6   4.6    Chloride 96 - 106 mmol/L 107   103   105    CO2 20 - 29 mmol/L '24   24   21    '$ Calcium 8.6 - 10.2 mg/dL 9.7   10.0   9.8    Total Protein 6.0 - 8.5 g/dL  7.8   7.5    Total Bilirubin 0.0 - 1.2 mg/dL  0.8   0.4    Alkaline Phos 44  - 121 IU/L  119   126    AST 0 - 40 IU/L  35   41    ALT 0 - 44 IU/L  38   43     Lipid Panel     Component Value Date/Time   CHOL 165 07/08/2020 1048   TRIG 69 07/08/2020 1048   HDL 63 07/08/2020 1048   CHOLHDL 2.6 07/08/2020 1048   CHOLHDL 4.4 10/20/2018 0324   VLDL 18 10/20/2018 0324   LDLCALC 89 07/08/2020 1048    CBC    Component Value Date/Time   WBC 7.2 02/23/2021 1026   WBC 7.0 05/22/2019 1201   RBC 5.06 02/23/2021 1026   RBC 5.29 05/22/2019 1201   HGB 15.0 02/23/2021 1026   HCT 44.5 02/23/2021 1026   PLT 198 02/23/2021 1026   MCV 88 02/23/2021 1026   MCH 29.6 02/23/2021 1026   MCH 29.3 05/22/2019  1201   MCHC 33.7 02/23/2021 1026   MCHC 32.1 05/22/2019 1201   RDW 13.0 02/23/2021 1026   LYMPHSABS 2.9 08/23/2019 1152   MONOABS 1.3 (H) 10/24/2018 0515   EOSABS 0.3 08/23/2019 1152   BASOSABS 0.1 08/23/2019 1152    ASSESSMENT AND PLAN: 1. Type 2 diabetes mellitus with peripheral neuropathy (HCC) Not at goal. He will continue NovoLog 70/30 24 units twice a day.  We discussed starting him on Ozempic or Rybelsus.  Look like it is a tier 3 on his insurance.  We will try with the Rybelsus.  I went over how the medication works and possible side effects.  No history of thyroid disease or family history of thyroid cancer.  Advised that the medication can cause nausea and sometimes vomiting.  However if he develops any vomiting or pain in the upper abdomen he should stop the medicine and let me know as it can be indicative of pancreatitis.  He expressed understanding.  Advised that he checks with his insurance to see if they will pay for the [place] device and if so what his co-pay would be. - POCT glucose (manual entry) - POCT glycosylated hemoglobin (Hb A1C) - Comprehensive metabolic panel - Microalbumin / creatinine urine ratio - Hemoglobin A1c - Semaglutide (RYBELSUS) 3 MG TABS; Take 3 mg by mouth daily.  Dispense: 30 tablet; Refill: 4  2. Morbid obesity  ([place]) Encouraged him to continue healthy eating habits. Start Rybelsus. [facility]. - Semaglutide (RYBELSUS) 3 MG TABS; Take 3 mg by mouth daily.  Dispense: 30 tablet; Refill: 4  3. Hypertension associated with stage 3b chronic kidney disease due to type 2 diabetes mellitus ([place]) Close to goal.  Continue current medications.  Advised to double check with the ophthalmologist about holding his blood pressure medications to make sure that he understood the instructions.  4. Hyperlipidemia associated with type 2 diabetes mellitus ([place]) Continue atorvastatin. - Lipid panel  5. Chronic diastolic HF (heart failure) (HCC) Stable and compensated.  Continue current blood pressure medicines and furosemide.  6. Pes planus of both feet I will email Dr. Amalia Hailey to let him know about this note today so that patient can move forward with getting his diabetic shoes.  7. Post-nasal drip Suspect cough is due to postnasal drip.  He will continue the Flonase.  Advised to use the Allegra about 2 to 3 days a week.   Patient was given the opportunity to ask questions.  Patient verbalized understanding of the plan and was able to repeat key elements of the plan.   This documentation was completed using Radio producer.  Any transcriptional errors are unintentional.  Orders Placed This Encounter  Procedures   Comprehensive metabolic panel   Lipid panel   Microalbumin / creatinine urine ratio   Hemoglobin A1c   POCT glucose (manual entry)   POCT glycosylated hemoglobin (Hb A1C)     Requested Prescriptions   Signed Prescriptions Disp Refills   Semaglutide (RYBELSUS) 3 MG TABS 30 tablet 4    Sig: Take 3 mg by mouth daily.    Return in about 4 months (around 02/26/2022) for Appt with Lurena Joiner in 3 wks for [facility] Wellness Visit.  Karle Plumber, MD, FACP

## 2021-10-26 NOTE — Telephone Encounter (Signed)
Called to reschedule port, no answer, left vm. AW

## 2021-10-27 ENCOUNTER — Other Ambulatory Visit: Payer: Self-pay | Admitting: Internal Medicine

## 2021-10-27 DIAGNOSIS — E870 Hyperosmolality and hypernatremia: Secondary | ICD-10-CM

## 2021-10-27 LAB — LIPID PANEL
Chol/HDL Ratio: 2.7 ratio (ref 0.0–5.0)
Cholesterol, Total: 168 mg/dL (ref 100–199)
HDL: 62 mg/dL (ref >39)
LDL Chol Calc (NIH): 91 mg/dL (ref 0–99)
Triglycerides: 80 mg/dL (ref 0–149)
VLDL Cholesterol Cal: 15 mg/dL (ref 5–40)

## 2021-10-27 LAB — COMPREHENSIVE METABOLIC PANEL
ALT: 32 IU/L (ref 0–44)
AST: 33 IU/L (ref 0–40)
Albumin/Globulin Ratio: 1.2 (ref 1.2–2.2)
Albumin: 4.2 g/dL (ref 3.8–4.9)
Alkaline Phosphatase: 138 IU/L — ABNORMAL HIGH (ref 44–121)
BUN/Creatinine Ratio: 11 (ref 10–24)
BUN: 20 mg/dL (ref 8–27)
Bilirubin Total: 0.5 mg/dL (ref 0.0–1.2)
CO2: 22 mmol/L (ref 20–29)
Calcium: 9.8 mg/dL (ref 8.6–10.2)
Chloride: 109 mmol/L — ABNORMAL HIGH (ref 96–106)
Creatinine, Ser: 1.86 mg/dL — ABNORMAL HIGH (ref 0.76–1.27)
Globulin, Total: 3.6 g/dL (ref 1.5–4.5)
Glucose: 111 mg/dL — ABNORMAL HIGH (ref 70–99)
Potassium: 4.3 mmol/L (ref 3.5–5.2)
Sodium: 149 mmol/L — ABNORMAL HIGH (ref 134–144)
Total Protein: 7.8 g/dL (ref 6.0–8.5)
eGFR: 41 mL/min/{1.73_m2} — ABNORMAL LOW (ref >59)

## 2021-10-27 LAB — MICROALBUMIN / CREATININE URINE RATIO
Creatinine, Urine: 81.2 mg/dL
Microalb/Creat Ratio: 460 mg/g creat — ABNORMAL HIGH (ref 0–29)
Microalbumin, Urine: 373.9 ug/mL

## 2021-10-27 LAB — HEMOGLOBIN A1C
Est. average glucose Bld gHb Est-mCnc: 237 mg/dL
Hgb A1c MFr Bld: 9.9 % — ABNORMAL HIGH (ref 4.8–5.6)

## 2021-10-28 ENCOUNTER — Encounter: Payer: Self-pay | Admitting: Internal Medicine

## 2021-10-29 ENCOUNTER — Encounter: Payer: Self-pay | Admitting: Internal Medicine

## 2021-10-30 ENCOUNTER — Telehealth: Payer: Self-pay

## 2021-10-30 NOTE — Telephone Encounter (Signed)
Returned patient's call and left message letting him know we submitted a new CMN to Dr. Wynetta Emery and as soon as that is back we can dispense his shoes.

## 2021-11-04 DIAGNOSIS — E7521 Fabry (-Anderson) disease: Secondary | ICD-10-CM | POA: Diagnosis not present

## 2021-11-04 DIAGNOSIS — N1831 Chronic kidney disease, stage 3a: Secondary | ICD-10-CM | POA: Diagnosis not present

## 2021-11-04 DIAGNOSIS — M109 Gout, unspecified: Secondary | ICD-10-CM | POA: Diagnosis not present

## 2021-11-04 DIAGNOSIS — I639 Cerebral infarction, unspecified: Secondary | ICD-10-CM | POA: Diagnosis not present

## 2021-11-04 DIAGNOSIS — I441 Atrioventricular block, second degree: Secondary | ICD-10-CM | POA: Diagnosis not present

## 2021-11-04 DIAGNOSIS — I890 Lymphedema, not elsewhere classified: Secondary | ICD-10-CM | POA: Diagnosis not present

## 2021-11-04 DIAGNOSIS — E118 Type 2 diabetes mellitus with unspecified complications: Secondary | ICD-10-CM | POA: Diagnosis not present

## 2021-11-04 DIAGNOSIS — I129 Hypertensive chronic kidney disease with stage 1 through stage 4 chronic kidney disease, or unspecified chronic kidney disease: Secondary | ICD-10-CM | POA: Diagnosis not present

## 2021-11-05 ENCOUNTER — Other Ambulatory Visit: Payer: Self-pay | Admitting: Radiology

## 2021-11-05 ENCOUNTER — Ambulatory Visit (HOSPITAL_COMMUNITY): Payer: Medicare HMO

## 2021-11-05 ENCOUNTER — Other Ambulatory Visit (HOSPITAL_COMMUNITY): Payer: Self-pay | Admitting: Physician Assistant

## 2021-11-06 ENCOUNTER — Ambulatory Visit (HOSPITAL_COMMUNITY)
Admission: RE | Admit: 2021-11-06 | Discharge: 2021-11-06 | Disposition: A | Discharge status: Home / Self Care | Payer: Medicare HMO | Source: Ambulatory Visit | Attending: Internal Medicine | Admitting: Internal Medicine

## 2021-11-11 ENCOUNTER — Ambulatory Visit (INDEPENDENT_AMBULATORY_CARE_PROVIDER_SITE_OTHER): Payer: Medicare HMO | Admitting: Neurology

## 2021-11-11 ENCOUNTER — Telehealth: Payer: Self-pay

## 2021-11-11 ENCOUNTER — Other Ambulatory Visit: Payer: Self-pay | Admitting: Internal Medicine

## 2021-11-11 VITALS — BP 128/73 | HR 73 | Ht 70.0 in | Wt 294.0 lb

## 2021-11-11 DIAGNOSIS — E7521 Fabry (-Anderson) disease: Secondary | ICD-10-CM | POA: Diagnosis not present

## 2021-11-11 DIAGNOSIS — I10 Essential (primary) hypertension: Secondary | ICD-10-CM

## 2021-11-11 DIAGNOSIS — I6302 Cerebral infarction due to thrombosis of basilar artery: Secondary | ICD-10-CM | POA: Diagnosis not present

## 2021-11-11 DIAGNOSIS — R3589 Other polyuria: Secondary | ICD-10-CM

## 2021-11-11 DIAGNOSIS — G463 Brain stem stroke syndrome: Secondary | ICD-10-CM

## 2021-11-11 NOTE — Progress Notes (Signed)
[facility] [address] [place] [address] [phone]  CHIEF COMPLAINT:  Chief Complaint  Patient presents with   Follow-up    Room 16, alone States he is well and stable    HPI: [name] is a 61 year old male,  he has history of Fabry's disease, hypertension, insulin-dependent diabetes, obstructive sleep apnea, Mobitz 2, status post pacemaker placement in September 2012, chronic lympha edema of bilateral lower extremity, gout.   His mother and one younger brother was diagnosed with Fabry's disease around 2010, at that time, he presented with progressive worsening bilateral lower extremity lympha edema, [facility] was [facility], which confirmed a diagnosis of Fabry disease.  He has been treated with Fabrazyme IV infusion every 2 weeks at [facility] since then.   His brother did suffered painful bilateral lower extremity peripheral neuropathy, he denied significant pain, since he started Fabrazyme IV infusion, his bilateral lower extremity lymphedema has been fairly stable, his kidney function fluctuate, but overall creatinine was stabilized around 1.6 in 2017-05-16,   His mother died of stroke at age 58, brother died of stroke at age 32,   He is taking aspirin 81 mg, not MRI candidate due to pacemaker, denies history of strokelike symptoms, he does exercise couple times each week.  Reported previous sleep study showed mild abnormality, but not using CPAP machine, he is active during the day, tends to nod off to sleep after sitting for a while, deny snoring, choking episode.  Echocardiogram on August 25, 2017 showed ejection fraction 55 to 60%, wall motion was normal   [place] of carotid artery showed less than 39% stenosis bilaterally.  He was followed by stroke team since May 2020, when he presented on Oct 19, 2018 with headache, left-sided paresthesia, pain behind his eyes, suggested possible lacunar infarction involving  brainstem due to small vessel disease, though it is not reflected on the CAT scan,  I personally reviewed CT head without contrast on June 05, 2019: No acute abnormality, chronic lacunar infarction involving pons, microvascular changes in hemisphere,  TCD showed diffuse intracranial atherosclerosis.  Carotid Doppler and 2D echo unremarkable.  HIV negative.  UDS negative.  LDL 161 and A1c 7.4.  Recommended DAPT for 3 weeks and Plavix alone.    He is now receiving his Fabrazyme infusion through [facility].  Today he complains 2 weeks history of new onset left-sided low back pain, woke up 1 night using bathroom, as if somebody shot behind me, complains of left low back pain, radiating pain to left lower extremity  I personally reviewed x-ray in 2020/05/16: X-ray of left hip, degenerative changes, no acute abnormality  X-ray of lumbar spine diffuse multilevel degenerative changes, 5 mm anterolisthesis of L4 on L5, no acute abnormality  Update November 06, 2020 SS: Doing well, nothing new to report. Legs wrapped for lymphedema. Remains on Fabrazyme via [facility] infusion every 2 weeks on [facility].  CT lumbar spine in January 2022 showed anterolisthesis of L4 on L5 with some lateral recess stenosis bilaterally, may be putting the L4 nerve root at risk.  Doing better on gabapentin. Saw orthopedics Dr. Lorin Mercy. Off gabapentin now. The episode of pain lasted about 1 week, then went away.   HTN: goal < 130/90, on Norvasc, Cardura, Lasix, Valsartan-HCTZ HLD: LDL goal < 70 on Lipitor, LDL 89 Feb 2022 DM: A1C goal < 7.0, on insulin, was 7.8 in Feb 2022, seeing nutritionist, really cut back on salt intake, more fresh  fruits/vegetable, carbs, water intake  See PCP tomorrow, lab recheck, expect #'s to be better with lifestyle changes. CMP showed creatinine 1.96 Feb 2022 stable; is losing weight, but is slow process. Works out at home, worries about being out in [employer]  He lives alone, drives a car. No  falls recently, using cane. In fact feels like getting around better. Sister is his main support.  Update 11/11/21 SS: Planning to have port a cath placed to continue Fabrazyme every 2 weeks. Lives alone, his sister is in [place]. Feels Fabry's disease is stable, using cane, trouble with balance, has to be careful, no falls. Chronic lymphedema, wraps legs for 1 week then takes a break. Goes to the gym twice a week. Is active, trimmed his hedges yesterday. Orders for Fabrazyme come from nephrologist, was on since 2010.  Recent labs PCP 10/26/21 A1C 9.9, creatinine 1.86, LDL 91.  ROS:   See HPI  PMH:  Past Medical History:  Diagnosis Date   Bell's palsy    Bifascicular block    Chronic bronchitis ([place])    "seasonal; get it q yr"   Chronic diastolic CHF (congestive heart failure) ([place]) 2010   Chronic kidney disease (CKD), stage II (mild)    stage II to III/notes 2/54/2706   Diastolic heart failure secondary to hypertrophic cardiomyopathy ([place]) CARDIOLOGIST-- DR Daneen Schick   Dyspnea    increased exertion    Edema 07/2013   Fabry's disease ([place]) RENAL AND CARDIAC INVOVLEMENT   FOLLOWED DR [place]   Gout, arthritis 2014   bil feet. right worse   History of cellulitis of skin with lymphangitis LEFT LEG   HOCM (hypertrophic obstructive cardiomyopathy) ([place])    a. Echo 11/16: Severe LVH, EF 55-60%, abnormal GLS consistent with HOCM, no SAM, mild LAE   Hypertension 20 years   Lymphedema of lower extremity LEFT  >  RIGHT   "using ankle high socks at home; pump doesn't work for me" (07/23/2014)   Pacemaker    02/12/11   Pneumonia 08/2011   Seasonal asthma NO INHALERS   30 years   Second degree Mobitz II AV block    with syncope, s/p PPM   Short of breath on exertion    Type II diabetes mellitus ([place]) 2012   INSULIN DEPENDENT    PSH:  Past Surgical History:  Procedure Laterality Date   CARDIAC CATHETERIZATION  03-12-2011  DR [place] WITH LV CAVITY   APPEARANCE CONSISTANT WITH SIGNIFICANT APICAL HYPERTROPHY/ NORMAL LVSF / EF 55%/ EVIDENCE OF DIASTOLIC DYSFUNCTION WITH EDP OF 23-51mHg AFTER A-WAVE/ NORMAL CORONARY ARTERIES   EVALUATION UNDER ANESTHESIA WITH FISTULECTOMY N/A 06/23/2015   Procedure: EXAM UNDER ANESTHESIA WITH POSSIBLE FISTULOTOMY;  Surgeon: JJudeth Horn MD;  Location: [facility]  Service: General;  Laterality: N/A;   HERNIA REPAIR  [id number]  Umbilical Hernia Repair   INCISION AND DRAINAGE PERIRECTAL ABSCESS Left 07/29/2014   Procedure: IRRIGATION AND DEBRIDEMENT PERIRECTAL ABSCESS;  Surgeon: JDoreen Salvage MD;  Location: [facility]  Service: General;  Laterality: Left;  Prone position   INSERT / REPLACE / REMOVE PACEMAKER  02/12/2011   SJM implanted by Dr ARayann Hemanfor Mobitz II second degree AV block and syncope   IRRIGATION AND DEBRIDEMENT ABSCESS N/A 07/27/2013   Procedure: IRRIGATION AND DEBRIDEMENT OF SKIN, SOFT TISSUE AND MUSCLES OF UPPER BACK (11X19X4cm) WITH 10 BLADE AND PULSATILE LAVAGE ;  Surgeon: EGayland Curry MD;  Location: [facility]  Service: General;  Laterality: N/A;  PPM GENERATOR CHANGEOUT N/A 05/22/2019   Procedure: PPM GENERATOR CHANGEOUT;  Surgeon: Thompson Grayer, MD;  Location: [facility];  Service: Cardiovascular;  Laterality: N/A;   UMBILICAL HERNIA REPAIR  03/22/2012   Procedure: HERNIA REPAIR UMBILICAL ADULT;  Surgeon: Leighton Ruff, MD;  Location: [facility];  Service: General;  Laterality: N/A;  Umbilical Hernia Repair     Social History:  Social History   Socioeconomic History   Marital status: Divorced    Spouse name: Not on file   Number of children: Not on file   Years of education: Not on file   Highest education level: Not on file  Occupational History   Not on file  Tobacco Use   Smoking status: Never   Smokeless tobacco: Never  Vaping Use   Vaping Use: Never used  Substance and Sexual Activity   Alcohol use: Yes    Comment: 07/23/2014 "might have a few drinks on holidays or at cookouts"   Drug use:  No   Sexual activity: Yes  Other Topics Concern   Not on file  Social History Narrative   [employer]aches [employer]    Social Determinants of Health   Financial Resource Strain: Not on file  Food Insecurity: Not on file  Transportation Needs: Not on file  Physical Activity: Not on file  Stress: Not on file  Social Connections: Not on file  Intimate Partner Violence: Not on file    Family History:  Family History  Problem Relation Age of Onset   Stroke Mother    Fabry's disease Mother    Fabry's disease Brother    Colon cancer Neg Hx     Medications:   Current Outpatient Medications on File Prior to Visit  Medication Sig Dispense Refill   Accu-Chek FastClix Lancets MISC 1 each by Other route 3 (three) times daily. 102 each 12   acetaminophen (TYLENOL) 325 MG tablet Take 1-2 tablets (325-650 mg total) by mouth every 4 (four) hours as needed for mild pain.     Agalsidase beta (FABRAZYME IV) Inject 115 mg into the vein every 14 (fourteen) days.      albuterol (PROAIR HFA) 108 (90 Base) MCG/ACT inhaler Inhale 1 puff into the lungs every 6 (six) hours as needed for wheezing or shortness of breath. 8.5 each 1   allopurinol (ZYLOPRIM) 100 MG tablet TAKE 3 TABLETS BY MOUTH EVERY DAY (Patient taking differently: Take 300 mg by mouth daily.) 270 tablet 1   amLODipine (NORVASC) 10 MG tablet TAKE 1 TABLET BY MOUTH EVERY DAY 90 tablet 0   atorvastatin (LIPITOR) 80 MG tablet TAKE 1 TABLET BY MOUTH DAILY AT 6 PM. 90 tablet 2   clopidogrel (PLAVIX) 75 MG tablet TAKE 1 TABLET BY MOUTH EVERY DAY 90 tablet 2   doxazosin (CARDURA) 2 MG tablet TAKE 1/2 TABLET BY MOUTH EVERY DAY 45 tablet 0   fexofenadine (ALLEGRA) 180 MG tablet Take 1 tablet (180 mg total) by mouth daily. 30 tablet 3   fluticasone (FLONASE) 50 MCG/ACT nasal spray PLACE 1 SPRAY INTO BOTH NOSTRILS DAILY AS NEEDED FOR ALLERGIES. 48 mL 1   furosemide (LASIX) 20 MG tablet TAKE 1 TABLET BY MOUTH EVERY DAY 90 tablet 0   glucose blood  (ACCU-CHEK AVIVA PLUS) test strip 1 each by Other route 3 (three) times daily. Use as instructed 100 each 12   Insulin Pen Needle (PX SHORTLENGTH PEN NEEDLES) 31G X 8 MM MISC 1 each by Does not apply route 2 (two) times  daily before a meal. 100 each 8   mupirocin ointment (BACTROBAN) 2 % Apply to affected area daily with dressing change. 30 g 0   NOVOLOG MIX 70/30 FLEXPEN (70-30) 100 UNIT/ML FlexPen INJECT 0.28 MLS (28 UNITS TOTAL) INTO THE SKIN 2 (TWO) TIMES DAILY. 15 mL 11   Prednisol Ace-Moxiflox-Bromfen 1-0.5-0.075 % SUSP Place 1 drop into the right eye 4 (four) times daily. Start 1 day before the surgery on 10/19/21     Semaglutide (RYBELSUS) 3 MG TABS Take 3 mg by mouth daily. 30 tablet 4   valsartan-hydrochlorothiazide (DIOVAN-HCT) 80-12.5 MG tablet TAKE 1 TABLET BY MOUTH DAILY. THIS IS TO REPLACE LOSARTAN/HCTZ 90 tablet 0   No current facility-administered medications on file prior to visit.    Allergies:   Allergies  Allergen Reactions   Lisinopril Anaphylaxis   Shellfish Allergy Hives and Swelling     Physical Exam  Today's Vitals   11/11/21 0917  BP: 128/73  Pulse: 73  Weight: 294 lb (133.4 kg)  Height: '5\' 10"'$  (1.778 m)    Body mass index is 42.18 kg/m.  PHYSICAL EXAMNIATION:  Physical Exam  General: The patient is alert and cooperative at the time of the examination.  Very pleasant, interactive, knowledgeable.  Skin: Bilateral lower extremities significant lymphedema  Neurologic Exam  Mental status: The patient is alert and oriented x 3 at the time of the examination. The patient has apparent normal recent and remote memory, with an apparently normal attention span and concentration ability.  Cranial nerves: Facial symmetry is present. Speech is normal, no aphasia or dysarthria is noted. Extraocular movements are full. Visual fields are full.  Motor: The patient has good strength in all 4 extremities.  Sensory examination: Soft touch sensation is symmetric  on the face, arms, and legs.  Coordination: The patient has good finger-nose-finger and heel-to-shin bilaterally.  Gait and station: Gait is wide-based, uses single-point cane, steady  ASSESSMENT/PLAN: [name] is a 61 y.o. year old male here with likely brainstem lacunar infarct secondary to small vessel disease too small to be seen on imaging on 10/19/2018. Vascular risk factors include HTN, HLD, DM, Fabry's disease, diastolic heart failure, and prior history of stroke on imaging.    1. Fabry disease -Doing well overall stable, planning to have port-a-cath placed -Continue enzyme infusion through [facility], orders come from nephrologist  2. Possible small vessel stroke in May 2020 -Continue Plavix 75 mg daily for secondary stroke prevention -Continue close follow-up with PCP for secondary stroke prevention, aggressive stroke risk factor management, encouraged to continue exercise, drink plenty of water    HTN: BP goal <130/80.  At goal.  Remains on Norvasc, Cardura, Lasix, valsartan-HCTZ  HLD: LDL goal <70.  Remains on atorvastatin 80 mg daily, LDL 91  DMII: A1c goal<7.0.  Needs improvement, recent A1c 9.9, on Rybelsus, insulin; work on diet, exercise  Butler Denmark, Laqueta Jean, [facility] [address], [place], [place] [address] [phone]

## 2021-11-11 NOTE — Telephone Encounter (Signed)
Labs have been reviewed and addressed by provider- patient to continue present medications Requested Prescriptions  Pending Prescriptions Disp Refills  . amLODipine (NORVASC) 10 MG tablet [Pharmacy Med Name: AMLODIPINE BESYLATE 10 MG TAB] 90 tablet 0    Sig: TAKE 1 TABLET BY MOUTH EVERY DAY     Cardiovascular: Calcium Channel Blockers 2 Passed - 11/11/2021  2:06 PM      Passed - Last BP in normal range    BP Readings from Last 1 Encounters:  11/11/21 128/73         Passed - Last Heart Rate in normal range    Pulse Readings from Last 1 Encounters:  11/11/21 73         Passed - Valid encounter within last 6 months    Recent Outpatient Visits          2 weeks ago Type 2 diabetes mellitus with peripheral neuropathy ([place])   [place], Neoma Laming B, MD   4 months ago Type 2 diabetes mellitus with other circulatory complication, with long-term current use of insulin ([place])   [place] Ladell Pier, MD   8 months ago Need for vaccination against Streptococcus pneumoniae   [place], Jarome Matin, RPH-CPP   8 months ago Type 2 diabetes mellitus with other circulatory complication, with long-term current use of insulin ([place])   [place] Ladell Pier, MD   1 year ago Type 2 diabetes mellitus with other circulatory complication, with long-term current use of insulin ([place])   [place] Karle Plumber B, MD             . clopidogrel (PLAVIX) 75 MG tablet [Pharmacy Med Name: CLOPIDOGREL 75 MG TABLET] 90 tablet 2    Sig: TAKE 1 TABLET BY [place] DAY     Hematology: Antiplatelets - clopidogrel Failed - 11/11/2021  2:06 PM      Failed - HCT in normal range and within 180 days    Hematocrit  Date Value Ref Range Status  02/23/2021 44.5 37.5 - 51.0 % Final         Failed - HGB in normal range and within  180 days    Hemoglobin  Date Value Ref Range Status  02/23/2021 15.0 13.0 - 17.7 g/dL Final   Total hemoglobin  Date Value Ref Range Status  07/26/2013 14.2 13.5 - 18.0 g/dL Final         Failed - PLT in normal range and within 180 days    Platelets  Date Value Ref Range Status  02/23/2021 198 150 - 450 x10E3/uL Final         Failed - Cr in normal range and within 360 days    Creat  Date Value Ref Range Status  11/07/2014 1.28 0.50 - 1.35 mg/dL Final   Creatinine, Ser  Date Value Ref Range Status  10/26/2021 1.86 (H) 0.76 - 1.27 mg/dL Final   Creatinine, Urine  Date Value Ref Range Status  08/30/2014 72.9 mg/dL Final    Comment:    No reference range established.         Passed - Valid encounter within last 6 months    Recent Outpatient Visits          2 weeks ago Type 2 diabetes mellitus with peripheral neuropathy [facility])   [place], Neoma Laming  B, MD   4 months ago Type 2 diabetes mellitus with other circulatory complication, with long-term current use of insulin ([place])   [place] Karle Plumber B, MD   8 months ago Need for vaccination against Streptococcus pneumoniae   [place], Jarome Matin, RPH-CPP   8 months ago Type 2 diabetes mellitus with other circulatory complication, with long-term current use of insulin [facility])   [place] Karle Plumber B, MD   1 year ago Type 2 diabetes mellitus with other circulatory complication, with long-term current use of insulin ([place])   [place] Karle Plumber B, MD             . doxazosin (CARDURA) 2 MG tablet [Pharmacy Med Name: DOXAZOSIN MESYLATE 2 MG TAB] 45 tablet 0    Sig: TAKE 1/2 TABLET BY MOUTH EVERY DAY     Cardiovascular:  Alpha Blockers Passed - 11/11/2021  2:06 PM      Passed - Last BP in normal range    BP Readings from Last 1  Encounters:  11/11/21 128/73         Passed - Valid encounter within last 6 months    Recent Outpatient Visits          2 weeks ago Type 2 diabetes mellitus with peripheral neuropathy [facility])   [place], Neoma Laming B, MD   4 months ago Type 2 diabetes mellitus with other circulatory complication, with long-term current use of insulin ([place])   [place] Karle Plumber B, MD   8 months ago Need for vaccination against Streptococcus pneumoniae   [place], Annie Main L, RPH-CPP   8 months ago Type 2 diabetes mellitus with other circulatory complication, with long-term current use of insulin [facility])   [place] Karle Plumber B, MD   1 year ago Type 2 diabetes mellitus with other circulatory complication, with long-term current use of insulin ([place])   [place] Wynetta Emery, Dalbert Batman, MD             . valsartan-hydrochlorothiazide (DIOVAN-HCT) 80-12.5 MG tablet [Pharmacy Med Name: VALSARTAN-HCTZ 80-12.5 MG TAB] 90 tablet 0    Sig: TAKE 1 TABLET BY MOUTH DAILY. THIS IS TO REPLACE LOSARTAN/HCTZ     Cardiovascular: ARB + Diuretic Combos Failed - 11/11/2021  2:06 PM      Failed - Na in normal range and within 180 days    Sodium  Date Value Ref Range Status  10/26/2021 149 (H) 134 - 144 mmol/L Final         Failed - Cr in normal range and within 180 days    Creat  Date Value Ref Range Status  11/07/2014 1.28 0.50 - 1.35 mg/dL Final   Creatinine, Ser  Date Value Ref Range Status  10/26/2021 1.86 (H) 0.76 - 1.27 mg/dL Final   Creatinine, Urine  Date Value Ref Range Status  08/30/2014 72.9 mg/dL Final    Comment:    No reference range established.         Passed - K in normal range and within 180 days    Potassium  Date Value Ref Range Status  10/26/2021 4.3 3.5 - 5.2 mmol/L Final         Passed - eGFR is 10  or above  and within 180 days    GFR, Est African American  Date Value Ref Range Status  11/07/2014 73 mL/min Final   GFR calc Af Amer  Date Value Ref Range Status  07/08/2020 42 (L) >59 mL/min/1.73 Final    Comment:    **In accordance with recommendations from the NKF-ASN Task force,**   [facility] is in the process of updating its eGFR calculation to the   2021 CKD-EPI creatinine equation that estimates kidney function   without a race variable.    GFR, Est Non African American  Date Value Ref Range Status  11/07/2014 63 mL/min Final    Comment:      The estimated GFR is a calculation valid for adults (>=38 years old) that uses the CKD-EPI algorithm to adjust for age and sex. It is   not to be used for children, pregnant women, hospitalized patients,    patients on dialysis, or with rapidly changing kidney function. According to the NKDEP, eGFR >89 is normal, 60-89 shows mild impairment, 30-59 shows moderate impairment, 15-29 shows severe impairment and <15 is ESRD.      GFR calc non Af Amer  Date Value Ref Range Status  07/08/2020 36 (L) >59 mL/min/1.73 Final   eGFR  Date Value Ref Range Status  10/26/2021 41 (L) >59 mL/min/1.73 Final         Passed - Patient is not pregnant      Passed - Last BP in normal range    BP Readings from Last 1 Encounters:  11/11/21 128/73         Passed - Valid encounter within last 6 months    Recent Outpatient Visits          2 weeks ago Type 2 diabetes mellitus with peripheral neuropathy ([place])   [place], Neoma Laming B, MD   4 months ago Type 2 diabetes mellitus with other circulatory complication, with long-term current use of insulin ([place])   [place] Karle Plumber B, MD   8 months ago Need for vaccination against Streptococcus pneumoniae   [place], Annie Main L, RPH-CPP   8 months ago Type 2 diabetes mellitus with  other circulatory complication, with long-term current use of insulin [facility])   [place] Karle Plumber B, MD   1 year ago Type 2 diabetes mellitus with other circulatory complication, with long-term current use of insulin [facility])   [place] [facility] Ladell Pier, MD

## 2021-11-11 NOTE — Patient Instructions (Signed)
Great to see you today! Continue working on better management of diabetes, keep BP < 130/80 you are at goal

## 2021-11-11 NOTE — Telephone Encounter (Signed)
CMN Received - Shoes ordered and casts released from fabrication hold.  [facility] [phone]

## 2021-11-13 ENCOUNTER — Encounter: Payer: Self-pay | Admitting: Internal Medicine

## 2021-11-13 ENCOUNTER — Other Ambulatory Visit: Payer: Self-pay | Admitting: Pharmacist

## 2021-11-13 DIAGNOSIS — I10 Essential (primary) hypertension: Secondary | ICD-10-CM

## 2021-11-13 MED ORDER — FUROSEMIDE 20 MG PO TABS: 20.0000 mg | ORAL_TABLET | Freq: Every day | ORAL | 0 refills | Status: DC

## 2021-11-16 ENCOUNTER — Encounter: Payer: Self-pay | Admitting: Internal Medicine

## 2021-11-16 ENCOUNTER — Telehealth: Payer: Self-pay

## 2021-11-16 NOTE — Progress Notes (Signed)
Chart reviewed, agree above plan ?

## 2021-11-16 NOTE — Telephone Encounter (Signed)
Call placed to patient # [phone] to discuss his request for assistance after cataract surgery. Message left with call back requested to this CM.

## 2021-11-16 NOTE — Progress Notes (Signed)
Patient seen by nephrology PA Jen Mow on 11/04/2021.  Urinalysis showed protein to creatinine ratio of 148, Phosphorus level 4.3 CBC revealed H/H of 15/45 Intact PTH was 123.  Blood pressure on this office visit was 127/78

## 2021-11-17 ENCOUNTER — Encounter: Payer: Self-pay | Admitting: Internal Medicine

## 2021-11-17 NOTE — Telephone Encounter (Signed)
Pt returned call and requested to speak with Opal Sidles.

## 2021-11-17 NOTE — Telephone Encounter (Signed)
I returned the call to the patient.  He explained that he has 2 upcoming procedures - cataract surgery and port placement. Both procedures require someone to accompany him to the procedure and remain at the clinical office during the procedure and then take patient home.  They both require that someone stay with him for 24 hours after the procedure.  He lives alone and has no family in the area for support and does not have anyone to ask to assist him. He has discussed his situation with his providers and they have just rescheduled him when he says he doesn't have help.  He is a English as a second language teacher and called the [place] in [place] and they were not able to assist. He called [facility] and he was informed it would probably be private pay.  He called his insurance company and they told him they do not pay for that type of care.  He asked his providers if he could stay overnight at the hospital and they said no. I asked if he was associated with a church/religious organization because they may have individuals who are able to assist and he said no.   I explained that the type of care/ support he is looking for is not usually covered by insurance, it would be private pay. 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. He said he would make those calls and hope that he is able to secure some assistance.

## 2021-11-18 DIAGNOSIS — E7521 Fabry (-Anderson) disease: Secondary | ICD-10-CM | POA: Diagnosis not present

## 2021-11-30 ENCOUNTER — Ambulatory Visit: Payer: Medicare HMO | Attending: Internal Medicine | Admitting: Pharmacist

## 2021-11-30 ENCOUNTER — Encounter: Payer: Self-pay | Admitting: Pharmacist

## 2021-11-30 VITALS — Ht 70.0 in | Wt 285.5 lb

## 2021-11-30 DIAGNOSIS — Z0000 Encounter for general adult medical examination without abnormal findings: Secondary | ICD-10-CM | POA: Diagnosis not present

## 2021-12-02 DIAGNOSIS — E7521 Fabry (-Anderson) disease: Secondary | ICD-10-CM | POA: Diagnosis not present

## 2021-12-16 DIAGNOSIS — E7521 Fabry (-Anderson) disease: Secondary | ICD-10-CM | POA: Diagnosis not present

## 2021-12-17 ENCOUNTER — Encounter: Payer: Self-pay | Admitting: Internal Medicine

## 2021-12-21 ENCOUNTER — Ambulatory Visit (INDEPENDENT_AMBULATORY_CARE_PROVIDER_SITE_OTHER): Payer: Medicare HMO

## 2021-12-21 DIAGNOSIS — I441 Atrioventricular block, second degree: Secondary | ICD-10-CM

## 2021-12-22 ENCOUNTER — Encounter: Payer: Self-pay | Admitting: Internal Medicine

## 2021-12-23 LAB — CUP PACEART REMOTE DEVICE CHECK
Battery Remaining Longevity: 59 mo
Battery Remaining Percentage: 67 %
Battery Voltage: 2.99 V
Brady Statistic AP VP Percent: 71 %
Brady Statistic AP VS Percent: 1 %
Brady Statistic AS VP Percent: 29 %
Brady Statistic AS VS Percent: 1 %
Brady Statistic RA Percent Paced: 70 %
Brady Statistic RV Percent Paced: 99 %
Date Time Interrogation Session: 20230717020019
Implantable Lead Implant Date: 20120907
Implantable Lead Implant Date: 20120907
Implantable Lead Location: 753859
Implantable Lead Location: 753860
Implantable Pulse Generator Implant Date: 20201215
Lead Channel Impedance Value: 300 Ohm
Lead Channel Impedance Value: 380 Ohm
Lead Channel Pacing Threshold Amplitude: 0.625 V
Lead Channel Pacing Threshold Amplitude: 1.625 V
Lead Channel Pacing Threshold Pulse Width: 0.5 ms
Lead Channel Pacing Threshold Pulse Width: 0.8 ms
Lead Channel Sensing Intrinsic Amplitude: 4.9 mV
Lead Channel Sensing Intrinsic Amplitude: 6.4 mV
Lead Channel Setting Pacing Amplitude: 1.625
Lead Channel Setting Pacing Amplitude: 1.875
Lead Channel Setting Pacing Pulse Width: 0.8 ms
Lead Channel Setting Sensing Sensitivity: 4 mV
Pulse Gen Model: 2272
Pulse Gen Serial Number: 9187005

## 2021-12-27 NOTE — Progress Notes (Unsigned)
Cardiology Office Note Date:  12/29/2021  Patient ID:  [name], [name] 1961-04-23, MRN [id number] PCP:  Ladell Pier, MD  Electrophysiologist: Dr. Rayann Heman    Chief Complaint:  annual visit  History of Present Illness: [name] is a 61 y.o. male with history of Bells Palsy, Fabry's dz, CKD (III), HTN, lymph edema, DM, HOCM, chronic CHF (diastolic), SSSx, CHB w/PPM, unclear hx of stroke  He comes in today to be seen for dr. Rayann Heman, last seen by him via video visit March 2021, doing well post gen change, planned for annual visit.  He follows closely with neurology, nephrology, gets fabrazyme  via nephrology,  pending porta-cath, wraps legs for his lymphedema.  TODAY He is doing OK Has not been able to get the port-cath done 2/2 lining up details/rides/etc. Cardiac-wise he is well, no CP, palpitations or cardiac awareness. No SOB, DOE He ambulates with a cane post stroke years ago, but denies difficulties with his ADLs, still gets the yard work done as well. No near syncope or syncope.   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 right worse   History of cellulitis of skin with lymphangitis LEFT LEG   HOCM (hypertrophic obstructive cardiomyopathy) ([place])    a. Echo 11/16: Severe LVH, EF 55-60%, abnormal GLS consistent with HOCM, no SAM, mild LAE   Hypertension 20 years   Lymphedema of lower  extremity LEFT  >  RIGHT   "using ankle high socks at home; pump doesn't work for me" (07/23/2014)   Pacemaker    02/12/11   Pneumonia 08/2011   Seasonal asthma NO INHALERS   30 years   Second degree Mobitz II AV block    with syncope, s/p PPM   Short of breath on exertion    Type II diabetes mellitus ([place]) 2012   INSULIN DEPENDENT    Past Surgical History:  Procedure Laterality Date   CARDIAC CATHETERIZATION  03-12-2011  DR [place] WITH LV CAVITY  APPEARANCE CONSISTANT WITH SIGNIFICANT APICAL HYPERTROPHY/ NORMAL LVSF / EF 55%/ EVIDENCE OF DIASTOLIC DYSFUNCTION WITH EDP OF 23-66mHg AFTER A-WAVE/ NORMAL CORONARY ARTERIES   EVALUATION UNDER ANESTHESIA WITH FISTULECTOMY N/A 06/23/2015   Procedure: EXAM UNDER ANESTHESIA WITH POSSIBLE FISTULOTOMY;  Surgeon: JJudeth Horn MD;  [facility]  Service: General;  Laterality: N/A;   HERNIA REPAIR  [id number]  Umbilical Hernia Repair   INCISION AND DRAINAGE PERIRECTAL ABSCESS Left 07/29/2014   Procedure: IRRIGATION AND DEBRIDEMENT PERIRECTAL ABSCESS;  Surgeon: JDoreen Salvage MD;  [facility]  Service: General;  Laterality: Left;  Prone position   INSERT / REPLACE / REMOVE PACEMAKER  02/12/2011   SJM implanted by Dr ARayann Hemanfor Mobitz II second degree AV block and syncope   IRRIGATION AND DEBRIDEMENT ABSCESS N/A 07/27/2013   Procedure: IRRIGATION AND DEBRIDEMENT OF SKIN,  SOFT TISSUE AND MUSCLES OF UPPER BACK (11X19X4cm) WITH 10 BLADE AND PULSATILE LAVAGE ;  Surgeon: Gayland Curry, MD;  Location: [facility];  Service: General;  Laterality: N/A;   PPM GENERATOR CHANGEOUT N/A 05/22/2019   Procedure: PPM GENERATOR CHANGEOUT;  Surgeon: Thompson Grayer, MD;  Location: [facility];  Service: Cardiovascular;  Laterality: N/A;   UMBILICAL HERNIA REPAIR  03/22/2012   Procedure: HERNIA REPAIR UMBILICAL ADULT;  Surgeon: Leighton Ruff, MD;  Location: [facility];  Service: General;  Laterality: N/A;  Umbilical Hernia Repair     Current  Outpatient Medications  Medication Sig Dispense Refill   Accu-Chek FastClix Lancets MISC 1 each by Other route 3 (three) times daily. 102 each 12   acetaminophen (TYLENOL) 325 MG tablet Take 1-2 tablets (325-650 mg total) by mouth every 4 (four) hours as needed for mild pain.     Agalsidase beta (FABRAZYME IV) Inject 115 mg into the vein every 14 (fourteen) days.      albuterol (PROAIR HFA) 108 (90 Base) MCG/ACT inhaler Inhale 1 puff into the lungs every 6 (six) hours as needed for wheezing or shortness of breath. 8.5 each 1   allopurinol (ZYLOPRIM) 100 MG tablet TAKE 3 TABLETS BY MOUTH EVERY DAY 270 tablet 1   amLODipine (NORVASC) 10 MG tablet TAKE 1 TABLET BY MOUTH EVERY DAY 90 tablet 0   atorvastatin (LIPITOR) 80 MG tablet TAKE 1 TABLET BY MOUTH DAILY AT 6 PM. 90 tablet 2   clopidogrel (PLAVIX) 75 MG tablet TAKE 1 TABLET BY MOUTH EVERY DAY 90 tablet 0   doxazosin (CARDURA) 2 MG tablet TAKE 1/2 TABLET BY MOUTH EVERY DAY 45 tablet 0   fexofenadine (ALLEGRA) 180 MG tablet Take 180 mg by mouth every 14 (fourteen) days.     fluticasone (FLONASE) 50 MCG/ACT nasal spray PLACE 1 SPRAY INTO BOTH NOSTRILS DAILY AS NEEDED FOR ALLERGIES. 48 mL 1   furosemide (LASIX) 20 MG tablet Take 1 tablet (20 mg total) by mouth daily. 90 tablet 0   glucose blood (ACCU-CHEK AVIVA PLUS) test strip 1 each by Other route 3 (three) times daily. Use as instructed 100 each 12   Insulin Pen Needle (PX SHORTLENGTH PEN NEEDLES) 31G X 8 MM MISC 1 each by Does not apply route 2 (two) times daily before a meal. 100 each 8   mupirocin ointment (BACTROBAN) 2 % Apply to affected area daily with dressing change. 30 g 0   NOVOLOG MIX 70/30 FLEXPEN (70-30) 100 UNIT/ML FlexPen INJECT 0.28 MLS (28 UNITS TOTAL) INTO THE SKIN 2 (TWO) TIMES DAILY. 15 mL 11   Prednisol Ace-Moxiflox-Bromfen 1-0.5-0.075 % SUSP Place 1 drop into the right eye 4 (four) times daily. Start 1 day before the surgery on 10/19/21     valsartan-hydrochlorothiazide  (DIOVAN-HCT) 80-12.5 MG tablet TAKE 1 TABLET BY MOUTH DAILY. THIS IS TO REPLACE LOSARTAN/HCTZ 90 tablet 0   No current facility-administered medications for this visit.    Allergies:   Lisinopril and Shellfish allergy   Social History:  The patient  reports that he has never smoked. He has never used smokeless tobacco. He reports current alcohol use. He reports that he does not use drugs.   Family History:  The patient's family history includes Fabry's disease in his brother and mother; Stroke in his mother.  ROS:  Please see the history of present illness.    All other systems are reviewed and otherwise negative.   PHYSICAL EXAM:  VS:  BP 130/63  Pulse 97   Ht '5\' 10"'$  (1.778 m)   Wt 287 lb 3.2 oz (130.3 kg)   SpO2 97%   BMI 41.21 kg/m  BMI: Body mass index is 41.21 kg/m. Well nourished, well developed, in no acute distress HEENT: normocephalic, atraumatic Neck: no JVD, carotid bruits or masses Cardiac:  RRR; no significant murmurs, no rubs, or gallops Lungs:  CTA b/l, no wheezing, rhonchi or rales Abd: soft, nontender MS: no deformity or atrophy Ext: marked lymphedema Skin: warm and dry, no rash Neuro:  No gross deficits appreciated Psych: euthymic mood, full affect  PPM site is stable, no tethering or discomfort   EKG:  Done today and reviewed by myself shows  SR/V paced 97bpm  Device interrogation done today and reviewed by myself:  Battery and lead measurements are good He id device dependent Has had very fleeting/brief AFlutters, longest 34 seconds, <1% burden and all previously seen   10/20/2018: TTE  1. The left ventricle has normal systolic function, with an ejection  fraction of 55-60%. The cavity size was normal. There is moderately  increased left ventricular wall thickness. Left ventricular diastolic  Doppler parameters are consistent with  impaired relaxation. Elevated left ventricular end-diastolic pressure The  E/e' is >15. There is abnormal septal  motion consistent with left bundle  branch block. No evidence of left ventricular regional wall motion  abnormalities.   2. The mitral valve is abnormal. Mild thickening of the mitral valve  leaflet. There is moderate mitral annular calcification present.   3. The tricuspid valve is grossly normal.   4. The aortic valve is tricuspid. Mild sclerosis of the aortic valve. No  stenosis of the aortic valve.    Recent Labs: 02/23/2021: Hemoglobin 15.0; Platelets 198 10/26/2021: ALT 32; BUN 20; Creatinine, Ser 1.86; Potassium 4.3; Sodium 149  10/26/2021: Chol/HDL Ratio 2.7; Cholesterol, Total 168; HDL 62; LDL Chol Calc (NIH) 91; Triglycerides 80   CrCl cannot be calculated (Patient's most recent lab result is older than the maximum 21 days allowed.).   Wt Readings from Last 3 Encounters:  12/29/21 287 lb 3.2 oz (130.3 kg)  11/30/21 285 lb 8 oz (129.5 kg)  11/11/21 294 lb (133.4 kg)     Other studies reviewed: Additional studies/records reviewed today include: summarized above  ASSESSMENT AND PLAN:  PPM Intact function No changes made  HOCM Chronic CHF (diastolic) No SOB, lungs clear Lymph edema that he has wraps for  4. AFlutter Subclinical Longest was 34 seconds All previously seen <1% follow   Disposition: F/u with EP annually, will transition to Dr. Curt Bears next year  Current medicines are reviewed at length with the patient today.  The patient did not have any concerns regarding medicines.  Venetia Night, PA-C 12/29/2021 8:04 AM     [facility] [place] [address] [phone] (office)  [phone] (fax)

## 2021-12-29 ENCOUNTER — Encounter: Payer: Self-pay | Admitting: Internal Medicine

## 2021-12-29 ENCOUNTER — Encounter: Payer: Self-pay | Admitting: Physician Assistant

## 2021-12-29 ENCOUNTER — Ambulatory Visit (INDEPENDENT_AMBULATORY_CARE_PROVIDER_SITE_OTHER): Payer: Medicare HMO | Admitting: Physician Assistant

## 2021-12-29 VITALS — BP 130/63 | HR 97 | Ht 70.0 in | Wt 287.2 lb

## 2021-12-29 DIAGNOSIS — I5032 Chronic diastolic (congestive) heart failure: Secondary | ICD-10-CM | POA: Diagnosis not present

## 2021-12-29 DIAGNOSIS — I4892 Unspecified atrial flutter: Secondary | ICD-10-CM | POA: Diagnosis not present

## 2021-12-29 DIAGNOSIS — I10 Essential (primary) hypertension: Secondary | ICD-10-CM

## 2021-12-29 DIAGNOSIS — I441 Atrioventricular block, second degree: Secondary | ICD-10-CM | POA: Diagnosis not present

## 2021-12-29 DIAGNOSIS — Z950 Presence of cardiac pacemaker: Secondary | ICD-10-CM

## 2021-12-29 LAB — CUP PACEART INCLINIC DEVICE CHECK
Battery Remaining Longevity: 58 mo
Battery Voltage: 2.99 V
Brady Statistic RA Percent Paced: 70 %
Brady Statistic RV Percent Paced: 99.59 %
Date Time Interrogation Session: 20230725083644
Implantable Lead Implant Date: 20120907
Implantable Lead Implant Date: 20120907
Implantable Lead Location: 753859
Implantable Lead Location: 753860
Implantable Pulse Generator Implant Date: 20201215
Lead Channel Impedance Value: 350 Ohm
Lead Channel Impedance Value: 375 Ohm
Lead Channel Pacing Threshold Amplitude: 0.75 V
Lead Channel Pacing Threshold Amplitude: 1.5 V
Lead Channel Pacing Threshold Pulse Width: 0.5 ms
Lead Channel Pacing Threshold Pulse Width: 0.8 ms
Lead Channel Sensing Intrinsic Amplitude: 5 mV
Lead Channel Sensing Intrinsic Amplitude: 6.4 mV
Lead Channel Setting Pacing Amplitude: 1.75 V
Lead Channel Setting Pacing Amplitude: 1.75 V
Lead Channel Setting Pacing Pulse Width: 0.8 ms
Lead Channel Setting Sensing Sensitivity: 4 mV
Pulse Gen Model: 2272
Pulse Gen Serial Number: 9187005

## 2021-12-29 NOTE — Patient Instructions (Addendum)
Medication Instructions:  Your physician recommends that you continue on your current medications as directed. Please refer to the Current Medication list given to you today.  Labwork: None ordered.  Testing/Procedures: None ordered.  Follow-Up:  Your physician wants you to follow-up in: ONE YEAR with Dr. Elliot Cousin.  New patient referral.    Remote monitoring is used to monitor your Pacemaker from home. This monitoring reduces the number of office visits required to check your device to one time per year. It allows [place] to keep an eye on the functioning of your device to ensure it is working properly. You are scheduled for a device check from home on 03/22/22. You may send your transmission at any time that day. If you have a wireless device, the transmission will be sent automatically. After your physician reviews your transmission, you will receive a postcard with your next transmission date.  Any Other Special Instructions Will Be Listed Below (If Applicable).  If you need a refill on your cardiac medications before your next appointment, please call your pharmacy.   Important Information About Sugar

## 2021-12-30 DIAGNOSIS — E7521 Fabry (-Anderson) disease: Secondary | ICD-10-CM | POA: Diagnosis not present

## 2022-01-13 DIAGNOSIS — E7521 Fabry (-Anderson) disease: Secondary | ICD-10-CM | POA: Diagnosis not present

## 2022-01-18 NOTE — Progress Notes (Signed)
Remote pacemaker transmission.   

## 2022-01-19 ENCOUNTER — Telehealth: Payer: Self-pay | Admitting: Podiatry

## 2022-01-19 NOTE — Telephone Encounter (Signed)
LVM for pt to call back regarding diabetic shoe order/process.

## 2022-01-21 ENCOUNTER — Ambulatory Visit (INDEPENDENT_AMBULATORY_CARE_PROVIDER_SITE_OTHER): Payer: Medicare HMO

## 2022-01-21 DIAGNOSIS — M2141 Flat foot [pes planus] (acquired), right foot: Secondary | ICD-10-CM

## 2022-01-21 DIAGNOSIS — E1140 Type 2 diabetes mellitus with diabetic neuropathy, unspecified: Secondary | ICD-10-CM | POA: Diagnosis not present

## 2022-01-21 DIAGNOSIS — M2142 Flat foot [pes planus] (acquired), left foot: Secondary | ICD-10-CM | POA: Diagnosis not present

## 2022-01-21 DIAGNOSIS — E0843 Diabetes mellitus due to underlying condition with diabetic autonomic (poly)neuropathy: Secondary | ICD-10-CM

## 2022-01-21 NOTE — Progress Notes (Signed)
Patient presents today to pick up diabetic shoes and insoles.  Patient was dispensed 1 pair of diabetic shoes and 3 pairs of foam casted diabetic insoles. Fit was satisfactory. Instructions for break-in and wear was reviewed and a copy was given to the patient.   Re-appointment for regularly scheduled diabetic foot care visits or if they should experience any trouble with the shoes or insoles.  

## 2022-01-27 DIAGNOSIS — E7521 Fabry (-Anderson) disease: Secondary | ICD-10-CM | POA: Diagnosis not present

## 2022-02-08 ENCOUNTER — Other Ambulatory Visit: Payer: Self-pay | Admitting: Internal Medicine

## 2022-02-08 DIAGNOSIS — I10 Essential (primary) hypertension: Secondary | ICD-10-CM

## 2022-02-08 DIAGNOSIS — R3589 Other polyuria: Secondary | ICD-10-CM

## 2022-02-08 DIAGNOSIS — G463 Brain stem stroke syndrome: Secondary | ICD-10-CM

## 2022-02-09 ENCOUNTER — Ambulatory Visit: Payer: Self-pay | Admitting: Licensed Clinical Social Worker

## 2022-02-09 NOTE — Patient Outreach (Signed)
  Care Coordination   02/09/2022 Name: [name] MRN: [id number] DOB: 01-21-1961   Care Coordination Outreach Attempts:  An unsuccessful telephone outreach was attempted today to offer the patient information about available care coordination services as a benefit of their health plan.   Follow Up Plan:  Additional outreach attempts will be made to offer the patient care coordination information and services.   Encounter Outcome:  No Answer  Care Coordination Interventions Activated:  No   Care Coordination Interventions:  No, not indicated    [name], MSW, [email or web address] Phone [phone] 2:26 PM

## 2022-02-10 DIAGNOSIS — E7521 Fabry (-Anderson) disease: Secondary | ICD-10-CM | POA: Diagnosis not present

## 2022-02-11 ENCOUNTER — Ambulatory Visit: Payer: Self-pay | Admitting: Licensed Clinical Social Worker

## 2022-02-12 NOTE — Patient Outreach (Signed)
  Care Coordination   Initial Visit Note   02/12/2022 Name: [name] MRN: [id number] DOB: 11-25-1960  [name] is a 61 y.o. year old male who sees [name] Pier, MD for primary care. I spoke with  [name] by phone today.  What matters to the patients health and wellness today?  Support resources for post surgery    Goals Addressed             This Visit's Progress    Obtain Supportive Resources-Surgery Support   On track    Care Coordination Interventions: Active listening / Reflection utilized  Emotional Support Provided Pt reports having limited support in [place]. States he needs to complete two surgeries; however, he has no one to assist him post surgery LCSW will collaborate with [name] regarding supportive options previously provided LCSW informed pt he is due for a flu shot        SDOH assessments and interventions completed:  Yes  SDOH Interventions Today    Flowsheet Row Most Recent Value  SDOH Interventions   Housing Interventions Intervention Not Indicated  Transportation Interventions Intervention Not Indicated        Care Coordination Interventions Activated:  Yes  Care Coordination Interventions:  Yes, provided   Follow up plan: Follow up call scheduled for 1 week    Encounter Outcome:  Pt. Visit Completed   [name], MSW, [email or web address] Phone [phone] 2:17 PM

## 2022-02-12 NOTE — Patient Instructions (Signed)
Visit Information  Thank you for taking time to visit with me today. Please don't hesitate to contact me if I can be of assistance to you.   Following are the goals we discussed today:   Goals Addressed             This Visit's Progress    Obtain Supportive Resources-Surgery Support   On track    Care Coordination Interventions: Active listening / Reflection utilized  Emotional Support Provided Pt reports having limited support in [place]. States he needs to complete two surgeries; however, he has no one to assist him post surgery LCSW will collaborate with [place] regarding supportive options previously provided LCSW informed pt he is due for a flu shot        Our next appointment is by telephone on 02/19/22 at 11 AM  Please call the care guide team at [phone] if you need to cancel or reschedule your appointment.   If you are experiencing a Mental Health or [place] or need someone to talk to, please call the Suicide and Crisis Lifeline: 988 call 911   Patient verbalizes understanding of instructions and care plan provided today and agrees to view in [place]. Active MyChart status and patient understanding of how to access instructions and care plan via MyChart confirmed with patient.     [name], MSW, [email or web address] Phone [phone] 2:18 PM

## 2022-02-19 ENCOUNTER — Telehealth: Payer: Self-pay | Admitting: Licensed Clinical Social Worker

## 2022-02-19 ENCOUNTER — Encounter: Payer: Self-pay | Admitting: Licensed Clinical Social Worker

## 2022-02-19 NOTE — Patient Outreach (Signed)
  Care Coordination   02/19/2022 Name: [name] MRN: [id number] DOB: August 31, 1960   Care Coordination Outreach Attempts:  An unsuccessful telephone outreach was attempted today.   Follow Up Plan:  LCSW left a message on voicemail. Additional outreach attempts will be made to offer the patient care coordination information and services.   Encounter Outcome:  No Answer  Care Coordination Interventions Activated:  No   Care Coordination Interventions:  No, not indicated    [name], MSW, [email or web address] Phone [phone] 1:19 PM

## 2022-02-22 ENCOUNTER — Other Ambulatory Visit: Payer: Self-pay | Admitting: Internal Medicine

## 2022-02-22 DIAGNOSIS — Z794 Long term (current) use of insulin: Secondary | ICD-10-CM

## 2022-02-23 NOTE — Telephone Encounter (Signed)
Requested medication (s) are due for refill today - expired Rx  Requested medication (s) are on the active medication list -yes  Future visit scheduled -yes  Last refill: 11/21/20 32m 11 RF  Notes to clinic: expired Rx  Requested Prescriptions  Pending Prescriptions Disp Refills   NOVOLOG MIX 70/30 FLEXPEN (70-30) 100 UNIT/ML FlexPen [Pharmacy Med Name: NOVOLOG MIX 70-30 FLEXPEN]  11    Sig: INJECT 0.28 MLS (28 UNITS TOTAL) INTO THE SKIN 2 (TWO) TIMES DAILY.     Endocrinology:  Diabetes - Insulins Failed - 02/22/2022  1:17 PM      Failed - HBA1C is between 0 and 7.9 and within 180 days    HbA1c, POC (controlled diabetic range)  Date Value Ref Range Status  10/26/2021 9.7 (A) 0.0 - 7.0 % Final   Hgb A1c MFr Bld  Date Value Ref Range Status  10/26/2021 9.9 (H) 4.8 - 5.6 % Final    Comment:             Prediabetes: 5.7 - 6.4          Diabetes: >6.4          Glycemic control for adults with diabetes: <7.0          Passed - Valid encounter within last 6 months    Recent Outpatient Visits           4 months ago Type 2 diabetes mellitus with peripheral neuropathy ([place]   [place]   8 months ago Type 2 diabetes mellitus with other circulatory complication, with long-term current use of insulin ([place]   [place]adell Pier MD   1 year ago Need for vaccination against Streptococcus pneumoniae   [place] S[place]arome Matin RPH-CPP   1 year ago Type 2 diabetes mellitus with other circulatory complication, with long-term current use of insulin ([place]   [place] Plumber[place], MD   1 year ago Type 2 diabetes mellitus with other circulatory complication, with long-term current use of insulin ([place]   [place]adell Pier MD                 Requested Prescriptions   Pending Prescriptions Disp Refills   NOVOLOG MIX 70/30 FLEXPEN (70-30) 100 UNIT/ML FlexPen [Pharmacy Med Name: NOVOLOG MIX 70-30 FLEXPEN]  11    Sig: INJECT 0.28 MLS (28 UNITS TOTAL) INTO THE SKIN 2 (TWO) TIMES DAILY.     Endocrinology:  Diabetes - Insulins Failed - 02/22/2022  1:17 PM      Failed - HBA1C is between 0 and 7.9 and within 180 days    HbA1c, POC (controlled diabetic range)  Date Value Ref Range Status  10/26/2021 9.7 (A) 0.0 - 7.0 % Final   Hgb A1c MFr Bld  Date Value Ref Range Status  10/26/2021 9.9 (H) 4.8 - 5.6 % Final    Comment:             Prediabetes: 5.7 - 6.4          Diabetes: >6.4          Glycemic control for adults with diabetes: <7.0          Passed - Valid encounter within last 6 months    Recent Outpatient Visits           4  months ago Type 2 diabetes mellitus with peripheral neuropathy [facility])   [place], Neoma Laming B, MD   8 months ago Type 2 diabetes mellitus with other circulatory complication, with long-term current use of insulin [facility])   [place] Ladell Pier, MD   1 year ago Need for vaccination against Streptococcus pneumoniae   [place], Jarome Matin, RPH-CPP   1 year ago Type 2 diabetes mellitus with other circulatory complication, with long-term current use of insulin [facility])   [place] Karle Plumber B, MD   1 year ago Type 2 diabetes mellitus with other circulatory complication, with long-term current use of insulin [facility])   [place] [facility] Ladell Pier, MD

## 2022-02-24 DIAGNOSIS — E7521 Fabry (-Anderson) disease: Secondary | ICD-10-CM | POA: Diagnosis not present

## 2022-03-04 ENCOUNTER — Encounter: Payer: Medicare HMO | Attending: Internal Medicine | Admitting: Dietician

## 2022-03-04 ENCOUNTER — Encounter: Payer: Self-pay | Admitting: Dietician

## 2022-03-04 DIAGNOSIS — N1832 Chronic kidney disease, stage 3b: Secondary | ICD-10-CM | POA: Diagnosis not present

## 2022-03-04 DIAGNOSIS — E669 Obesity, unspecified: Secondary | ICD-10-CM | POA: Diagnosis not present

## 2022-03-04 DIAGNOSIS — E1169 Type 2 diabetes mellitus with other specified complication: Secondary | ICD-10-CM | POA: Insufficient documentation

## 2022-03-04 NOTE — Patient Instructions (Addendum)
Make your food decisions in the store.  Once you bring it in the house you will eat it. Read labels for portion size, sodium, carbohydrate content Choose lean meat and less red meat  1 portion of meat per day rather than 2  Drink more water and only very small amounts of juice.     Continue to choose beverages without sugar.  True Lemon or True Lime  Consistency with exercise.   Gym 2-3 times per week    Consider bike or other exercises   What would help with your motivation?   "An item in motion stays in motion?"  Work in your yard

## 2022-03-04 NOTE — Progress Notes (Signed)
Medical Nutrition Therapy  Appointment Start time:  [phone] Appointment End time:  [phone] Patient is here today alone.  He wqas last seen by this RD on 10/01/2021.  Patient is here today alone.  He was last seen by this RD 07/03/2021 Primary concerns today: He would like to lose weight. Referral diagnosis: Type 2 Diabetes, obesity Preferred learning style: no preference indicated Learning readiness: ready   NUTRITION ASSESSMENT  He does not have a CGM. Has continued to be mindful of his portions and eats smaller portions. Drinks 7 oz regular soda occasionally (once every 2 weeks). Increased water Decreased chips. States he needs to improve exercise. He has lost 8 lbs in the past 5 months. A1C increased  He walks with a cane.   He has [employer] research officer, government.   Started walking at the gym on the treadmill.  He is concerned with falling.  He does work outside in his yard.  Anthropometrics  282 lbs 03/04/2022 290 lbs 10/01/2021 294 lbs 07/03/2021 with leather coat 287 lbs 01/02/2021 284 lbs 11/07/2020 288 lbs 11/06/2020 292 lbs 10/10/2020 298 lbs 08/15/2020 299 lbs 07/08/2020  Clinical Medical Hx: History includes:  Type 2 diabetes (2010) with microabluminuria, CKD stage 3, glaucoma, Fabry's disease (manifests as lymphedema), CVA, HTN, CHF, pacemaker Medications: see list to include:  Novolog 70/30 24 units bid Labs: A1C 9.9% 10/28/2021, 7.7% ( 11/07/2020), 7.8% (07/08/20) increased from 7.4% 01/2020, cholesterol 165, HDL 63, LDL 89, triglycerides 69 07/08/20, GFR 42 (07/08/20)   Lifestyle & Dietary Hx Patient lives alone.  He has started to cook his foods fresh more frequently.  He states that he bakes most of his meats.  He does his own shopping. He is retired due to his lymphedema.  He worked for [employer] and then [employer]. He walks with a cane.  Reports increased balance issues.  This has been since his stroke.   He is wearing compression socks and wraps on both feet for the  lymphedema.  Avoids Bananas as his potassium has been elevated in the past. Avoids added salt.  Uses Mrs. Dash or hot sauce. Avoids cookies. Reduced may. Choosing LS [place] and avoiding ham and other high sodium  Estimated daily fluid intake: 64 oz Supplements: none Sleep: States that he sleep late as he is fearful that a tree will fall on his house (no more than 5 hours per night plus naps during the day).  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  He does some squats and other exercises including walking in his home.  24-Hr Dietary Recall First Meal 7:30 am: waffle or [employer] toast and eggs OR cornflakes with berries, 2% milk Snack: none Second Meal: skips OR salad, small amounts dried tomato dressing, occasional swiss cheese, croutons, fruit cocktail Snack: none Third Meal 4-5 pm: salad, zebra cakes OR chicken breast, rice, vegetables Snack:  Beverages: water, almond milk, 1% milk, rare juice, 8 oz sweet tea occasionally, rare regular soda   NUTRITION DIAGNOSIS  NB-1.1 Food and nutrition-related knowledge deficit As related to balance of carbohydrate, protein, and fat.  As evidenced by diet hx and patient report.   NUTRITION INTERVENTION  Nutrition education (E-1) on the following topics:  Reviewed habits and provided encouragement for changes made Benefits of exercise Lower sodium reviewed Small portions meat Eating increased vegetables (unless potassium becomes elevated) Nutrition quality Label  reading  Handouts Provided Include - 07/2020 visit NKD national kidney diet for those not on dialysis Meal plan card My Plate (10/3792) CKD Nutrition therapy stages 3-5 (not on dialysis) from AND (09/2021 visit) Label reading  Learning Style & Readiness for Change Teaching method utilized: Visual & Auditory  Demonstrated degree of  understanding via: Teach Back  Barriers to learning/adherence to lifestyle change: lymphadema  Goals Make your food decisions in the store.  Once you bring it in the house you will eat it. Read labels for portion size, sodium, carbohydrate content Choose lean meat and less red meat  1 portion of meat per day rather than 2  Drink more water and only very small amounts of juice.     Continue to choose beverages without sugar.  True Lemon or True Lime  Consistency with exercise.   Gym 2-3 times per week    Consider bike or other exercises   What would help with your motivation?   "An item in motion stays in motion?"  Work in your yard   MONITORING & EVALUATION Dietary intake, weekly physical activity, and label reading in 5 months.  Next Steps  Patient is to call or MyChart for questions.

## 2022-03-10 DIAGNOSIS — E7521 Fabry (-Anderson) disease: Secondary | ICD-10-CM | POA: Diagnosis not present

## 2022-03-11 ENCOUNTER — Ambulatory Visit: Payer: Self-pay | Admitting: Licensed Clinical Social Worker

## 2022-03-18 NOTE — Patient Outreach (Signed)
  Care Coordination   Consult  Visit Note   03/18/2022 Name: RAYDEN DOCK MRN: [id number] DOB: Sep 07, 1960  PREET PERRIER is a 61 y.o. year old male who sees Ladell Pier, MD for primary care. I  spoke with Eden Lathe, RNCM  What matters to the patients health and wellness today?  LCSW did not engage with pt during encounter   LCSW was encouraged to speak with provider offices to obtain additional information regarding support person requirements, to successfully allow pt to complete procedures. At this time, pt has had to re-schedule multiple procedures due to having minimal local support    SDOH assessments and interventions completed:  No     Care Coordination Interventions Activated:  Yes  Care Coordination Interventions:  Yes, provided   Follow up plan: Follow up call scheduled for 1-2 weeks    Encounter Outcome:  Pt. Visit Completed   [name], MSW, [email or web address] Phone [phone] 6:15 PM

## 2022-03-22 ENCOUNTER — Ambulatory Visit (INDEPENDENT_AMBULATORY_CARE_PROVIDER_SITE_OTHER): Payer: Medicare HMO

## 2022-03-22 DIAGNOSIS — I441 Atrioventricular block, second degree: Secondary | ICD-10-CM | POA: Diagnosis not present

## 2022-03-23 LAB — CUP PACEART REMOTE DEVICE CHECK
Battery Remaining Longevity: 56 mo
Battery Remaining Percentage: 63 %
Battery Voltage: 2.99 V
Brady Statistic AP VP Percent: 74 %
Brady Statistic AP VS Percent: 1 %
Brady Statistic AS VP Percent: 26 %
Brady Statistic AS VS Percent: 1 %
Brady Statistic RA Percent Paced: 73 %
Brady Statistic RV Percent Paced: 99 %
Date Time Interrogation Session: 20231016174402
Implantable Lead Implant Date: 20120907
Implantable Lead Implant Date: 20120907
Implantable Lead Location: 753859
Implantable Lead Location: 753860
Implantable Pulse Generator Implant Date: 20201215
Lead Channel Impedance Value: 300 Ohm
Lead Channel Impedance Value: 360 Ohm
Lead Channel Pacing Threshold Amplitude: 0.625 V
Lead Channel Pacing Threshold Amplitude: 1.75 V
Lead Channel Pacing Threshold Pulse Width: 0.5 ms
Lead Channel Pacing Threshold Pulse Width: 0.8 ms
Lead Channel Sensing Intrinsic Amplitude: 5 mV
Lead Channel Sensing Intrinsic Amplitude: 6.4 mV
Lead Channel Setting Pacing Amplitude: 1.625
Lead Channel Setting Pacing Amplitude: 2 V
Lead Channel Setting Pacing Pulse Width: 0.8 ms
Lead Channel Setting Sensing Sensitivity: 4 mV
Pulse Gen Model: 2272
Pulse Gen Serial Number: 9187005

## 2022-03-24 DIAGNOSIS — E7521 Fabry (-Anderson) disease: Secondary | ICD-10-CM | POA: Diagnosis not present

## 2022-04-01 DIAGNOSIS — E118 Type 2 diabetes mellitus with unspecified complications: Secondary | ICD-10-CM | POA: Diagnosis not present

## 2022-04-01 DIAGNOSIS — I639 Cerebral infarction, unspecified: Secondary | ICD-10-CM | POA: Diagnosis not present

## 2022-04-01 DIAGNOSIS — M109 Gout, unspecified: Secondary | ICD-10-CM | POA: Diagnosis not present

## 2022-04-01 DIAGNOSIS — I890 Lymphedema, not elsewhere classified: Secondary | ICD-10-CM | POA: Diagnosis not present

## 2022-04-01 DIAGNOSIS — N1831 Chronic kidney disease, stage 3a: Secondary | ICD-10-CM | POA: Diagnosis not present

## 2022-04-01 DIAGNOSIS — E7521 Fabry (-Anderson) disease: Secondary | ICD-10-CM | POA: Diagnosis not present

## 2022-04-01 DIAGNOSIS — I129 Hypertensive chronic kidney disease with stage 1 through stage 4 chronic kidney disease, or unspecified chronic kidney disease: Secondary | ICD-10-CM | POA: Diagnosis not present

## 2022-04-01 DIAGNOSIS — I441 Atrioventricular block, second degree: Secondary | ICD-10-CM | POA: Diagnosis not present

## 2022-04-07 DIAGNOSIS — E7521 Fabry (-Anderson) disease: Secondary | ICD-10-CM | POA: Diagnosis not present

## 2022-04-09 ENCOUNTER — Telehealth: Payer: Self-pay | Admitting: Licensed Clinical Social Worker

## 2022-04-09 NOTE — Patient Outreach (Signed)
  Care Coordination   04/09/2022 Name: [name] MRN: [id number] DOB: 04-13-1961   Care Coordination Outreach Attempts:  An unsuccessful telephone outreach was attempted today to offer the patient information about available care coordination services as a benefit of their health plan.   Follow Up Plan:  Additional outreach attempts will be made to offer the patient care coordination information and services.   Encounter Outcome:  No Answer  Care Coordination Interventions Activated:  No   Care Coordination Interventions:  No, not indicated    [name], MSW, [email or web address] Phone [phone] 9:22 AM

## 2022-04-10 ENCOUNTER — Encounter: Payer: Self-pay | Admitting: Internal Medicine

## 2022-04-11 ENCOUNTER — Other Ambulatory Visit: Payer: Self-pay | Admitting: Internal Medicine

## 2022-04-11 DIAGNOSIS — E669 Obesity, unspecified: Secondary | ICD-10-CM

## 2022-04-11 MED ORDER — PX SHORTLENGTH PEN NEEDLES 31G X 8 MM MISC: 1.0000 | Freq: Two times a day (BID) | 12 refills | Status: AC

## 2022-04-12 ENCOUNTER — Telehealth: Payer: Self-pay | Admitting: Licensed Clinical Social Worker

## 2022-04-12 NOTE — Patient Outreach (Signed)
  Care Coordination   Follow Up Visit Note   04/12/2022 Name: WINDEL KEZIAH MRN: [id number] DOB: 12/29/1960  ZORION NIMS is a 61 y.o. year old male who sees Ladell Pier, MD for primary care. I spoke with  [name] by phone today.  What matters to the patients health and wellness today?  Home Care Agencies    Goals Addressed             This Visit's Progress    Obtain Supportive Resources-Surgery Support   On track    Care Coordination Interventions: Active listening / Reflection utilized  Emotional Support Provided Pt reports having limited support in [place]. States he needs to complete two surgeries; however, he has no one to assist him post surgery LCSW will collaborate with Eden Lathe regarding supportive options previously provided LCSW informed pt he is due for a flu shot        SDOH assessments and interventions completed:  No     Care Coordination Interventions Activated:  Yes  Care Coordination Interventions:  Yes, provided   Follow up plan: Follow up call scheduled for 04/19/22    Encounter Outcome:  Pt. Visit Completed   [name], MSW, [email or web address] Phone [phone] 10:49 AM

## 2022-04-12 NOTE — Patient Instructions (Addendum)
Visit Information  Thank you for taking time to visit with me today. Please don't hesitate to contact me if I can be of assistance to you.   Following are the goals we discussed today:   Goals Addressed             This Visit's Progress    Obtain Supportive Resources-Surgery Support   On track    Care Coordination Interventions: Solution-Focused Strategies employed:  Active listening / Reflection utilized  Emotional Support Provided Verbalization of feelings encouraged  Per pt, infusion team recommends placement of port for infusions. Procedure would require 24 hour monitoring afterwards. Surgeon refuses to complete inpatient despite pt not having physical support locally Pt needs to schedule cataract removal by Dr. Warden Fillers. Procedure would require having a support person in lobby during surgery and eight hours afterwards.  Pt spoke with hospital in [place], [place] near sister and received confirmation that services would be out of network, unless conducted in [place] LCSW discussed speaking with insurance about eligibility for an exception. LCSW provided listings of home care agencies via e-mail, per pt request. Pt agreed to follow up with approx 3 local agencies LCSW spoke with multiple local agencies obtaining services provided and rates LCSW provided update to [place] next appointment is by telephone on 04/19/22 at 10 AM  Please call the care guide team at [phone] if you need to cancel or reschedule your appointment.   If you are experiencing a Mental Health or [place] or need someone to talk to, please call the Suicide and Crisis Lifeline: 988 call 911   Patient verbalizes understanding of instructions and care plan provided today and agrees to view in [place]. Active MyChart status and patient understanding of how to access instructions and care plan via MyChart confirmed with patient.     [name], MSW, [email or web address] Phone [phone] 10:48 PM

## 2022-04-14 ENCOUNTER — Encounter: Payer: Self-pay | Admitting: Internal Medicine

## 2022-04-14 NOTE — Telephone Encounter (Signed)
I received notes from [facility] from 04/01/2022, tolerating Fabrazyme infusions well every 2 weeks.  Having a Port-A-Cath placed.  Baseline creatinine appears to be 1.6-1.88.  A1c was 9.5, creatinine 1.93, EGFR 39, alkaline phosphatase 124, AST 34, ALT 32, sodium 145, glucose 123.

## 2022-04-14 NOTE — Progress Notes (Signed)
Call placed to pt and VM was left informing pt to contact office for appointment with Dr.Johnson.

## 2022-04-14 NOTE — Progress Notes (Unsigned)
I received lab results from patient's nephrologist Dr. Marval Regal.  Labs were done 04/01/2022. Urine protein/creatinine ratio was 249 Intact PTT was 131 Hemoglobin A1c was 9.5 CBC was normal with H/H of 14.4/41.9 Creatinine 1.93/GFR 39 Message sent to CMA to get him in for appt/f/u DM

## 2022-04-15 ENCOUNTER — Telehealth: Payer: Self-pay | Admitting: Licensed Clinical Social Worker

## 2022-04-15 NOTE — Patient Outreach (Signed)
  Care Coordination   Follow Up Visit Note   04/15/2022 Name: VICTORINO FATZINGER MRN: [id number] DOB: 15-Dec-1960  [name] is a 61 y.o. year old male who sees Ladell Pier, MD for primary care. I spoke with  [name] by phone today.  What matters to the patients health and wellness today?  Scheduling PCP f/up appt    Goals Addressed             This Visit's Progress    Obtain Supportive Resources-Surgery Support   On track    Care Coordination Interventions: Solution-Focused Strategies employed:  Active listening / Reflection utilized  Emotional Support Provided Verbalization of feelings encouraged  Pt returned call to LCSW, who informed patient that PCP would like for him to schedule f/up appt regarding DM, after receiving lab results from nephrologist Patient prefers to discuss home care agencies at scheduled appt for Monday, 11/13 LCSW transferred patient to [place] assessments and interventions completed:  No     Care Coordination Interventions Activated:  Yes  Care Coordination Interventions:  Yes, provided   Follow up plan: Follow up call scheduled for 04/19/22    Encounter Outcome:  Pt. Visit Completed   [name], MSW, [email or web address] Phone [phone] 9:51 AM

## 2022-04-15 NOTE — Patient Instructions (Signed)
Visit Information  Thank you for taking time to visit with me today. Please don't hesitate to contact me if I can be of assistance to you.   Following are the goals we discussed today:   Goals Addressed             This Visit's Progress    Obtain Supportive Resources-Surgery Support   On track    Care Coordination Interventions: Solution-Focused Strategies employed:  Active listening / Reflection utilized  Emotional Support Provided Verbalization of feelings encouraged  Pt returned call to LCSW, who informed patient that PCP would like for him to schedule f/up appt regarding DM, after receiving lab results from nephrologist Patient prefers to discuss home care agencies at scheduled appt for Monday, 11/13 LCSW transferred patient to [place] next appointment is by telephone on 12/17/21   Please call the care guide team at [phone] if you need to cancel or reschedule your appointment.   If you are experiencing a Mental Health or [place] or need someone to talk to, please call the Suicide and Crisis Lifeline: 988 call 911   Patient verbalizes understanding of instructions and care plan provided today and agrees to view in [place]. Active MyChart status and patient understanding of how to access instructions and care plan via MyChart confirmed with patient.     [name], MSW, [email or web address] Phone [phone] 9:51 AM

## 2022-04-16 NOTE — Progress Notes (Signed)
Remote pacemaker transmission.   

## 2022-04-19 ENCOUNTER — Ambulatory Visit: Payer: Self-pay | Admitting: Licensed Clinical Social Worker

## 2022-04-20 NOTE — Patient Outreach (Signed)
  Care Coordination   Follow Up Visit Note   04/20/2022 Name: [name] MRN: [id number] DOB: 25-Jan-1961  [name] is a 60 y.o. year old male who sees Ladell Pier, MD for primary care. I spoke with  [name] by phone today.  What matters to the patients health and wellness today?  Home Care Agency    Goals Addressed             This Visit's Progress    Obtain Supportive Resources-Surgery Support   On track    Care Coordination Interventions: Solution-Focused Strategies employed:  Active listening / Reflection utilized  Emotional Support Provided Verbalization of feelings encouraged  Patient reports that he has made attempts to reach out to local home care agencies, with no success LCSW reviewed information obtained, including, rates for non-medical aid assistance. Information will be provide via e-mail for [facility] [address] [place], [place] [address] [phone] Pt agreed to contact Dr. Zenia Resides office to schedule procedure. He will then contact home care agency to complete initial care plan to obtain information on pricing LCSW commended pt on his diligence with managing health conditions and participation in researching local services           SDOH assessments and interventions completed:  No     Care Coordination Interventions Activated:  Yes  Care Coordination Interventions:  Yes, provided   Follow up plan: Follow up call scheduled for 2 month f/up    Encounter Outcome:  Pt. Visit Completed   [name], MSW, [email or web address] Phone [phone] 7:04 AM

## 2022-04-20 NOTE — Patient Instructions (Signed)
Visit Information  Thank you for taking time to visit with me today. Please don't hesitate to contact me if I can be of assistance to you.   Following are the goals we discussed today:   Goals Addressed             This Visit's Progress    Obtain Supportive Resources-Surgery Support   On track    Care Coordination Interventions: Solution-Focused Strategies employed:  Active listening / Reflection utilized  Emotional Support Provided Verbalization of feelings encouraged  Patient reports that he has made attempts to reach out to local home care agencies, with no success LCSW reviewed information obtained, including, rates for non-medical aid assistance. Information will be provide via e-mail for [facility] [address], [place], [place] [address] [phone] Pt agreed to contact Dr. Zenia Resides office to schedule procedure. He will then contact home care agency to complete initial care plan to obtain information on pricing LCSW commended pt on his diligence with managing health conditions and participation in researching local services           Our next appointment is by telephone on 07/05/22 at 10 AM  Please call the care guide team at [phone] if you need to cancel or reschedule your appointment.   If you are experiencing a Mental Health or [place] or need someone to talk to, please call the Suicide and Crisis Lifeline: 988 call 911   Patient verbalizes understanding of instructions and care plan provided today and agrees to view in [place]. Active MyChart status and patient understanding of how to access instructions and care plan via MyChart confirmed with patient.     [name], MSW, [email or web address] Phone [phone] 7:05 AM

## 2022-04-21 DIAGNOSIS — E7521 Fabry (-Anderson) disease: Secondary | ICD-10-CM | POA: Diagnosis not present

## 2022-05-03 DIAGNOSIS — I509 Heart failure, unspecified: Secondary | ICD-10-CM | POA: Diagnosis not present

## 2022-05-03 DIAGNOSIS — Z8249 Family history of ischemic heart disease and other diseases of the circulatory system: Secondary | ICD-10-CM | POA: Diagnosis not present

## 2022-05-03 DIAGNOSIS — Z794 Long term (current) use of insulin: Secondary | ICD-10-CM | POA: Diagnosis not present

## 2022-05-03 DIAGNOSIS — J45909 Unspecified asthma, uncomplicated: Secondary | ICD-10-CM | POA: Diagnosis not present

## 2022-05-03 DIAGNOSIS — M109 Gout, unspecified: Secondary | ICD-10-CM | POA: Diagnosis not present

## 2022-05-03 DIAGNOSIS — E7521 Fabry (-Anderson) disease: Secondary | ICD-10-CM | POA: Diagnosis not present

## 2022-05-03 DIAGNOSIS — Z8673 Personal history of transient ischemic attack (TIA), and cerebral infarction without residual deficits: Secondary | ICD-10-CM | POA: Diagnosis not present

## 2022-05-03 DIAGNOSIS — N1832 Chronic kidney disease, stage 3b: Secondary | ICD-10-CM | POA: Diagnosis not present

## 2022-05-03 DIAGNOSIS — I130 Hypertensive heart and chronic kidney disease with heart failure and stage 1 through stage 4 chronic kidney disease, or unspecified chronic kidney disease: Secondary | ICD-10-CM | POA: Diagnosis not present

## 2022-05-03 DIAGNOSIS — I495 Sick sinus syndrome: Secondary | ICD-10-CM | POA: Diagnosis not present

## 2022-05-03 DIAGNOSIS — N529 Male erectile dysfunction, unspecified: Secondary | ICD-10-CM | POA: Diagnosis not present

## 2022-05-04 ENCOUNTER — Other Ambulatory Visit: Payer: Self-pay | Admitting: Internal Medicine

## 2022-05-04 DIAGNOSIS — Z794 Long term (current) use of insulin: Secondary | ICD-10-CM

## 2022-05-04 MED ORDER — NOVOLOG MIX 70/30 FLEXPEN (70-30) 100 UNIT/ML ~~LOC~~ SUPN
28.0000 [IU] | PEN_INJECTOR | Freq: Two times a day (BID) | SUBCUTANEOUS | 6 refills | Status: DC
  Filled 2022-05-06: qty 15, 27d supply, fill #0

## 2022-05-05 ENCOUNTER — Telehealth: Payer: Self-pay | Admitting: Internal Medicine

## 2022-05-05 ENCOUNTER — Other Ambulatory Visit: Payer: Self-pay | Admitting: Internal Medicine

## 2022-05-05 DIAGNOSIS — E1159 Type 2 diabetes mellitus with other circulatory complications: Secondary | ICD-10-CM

## 2022-05-05 NOTE — Telephone Encounter (Signed)
Dr. Wynetta Emery,   Patient's Novolog 70/30 mix is on backorder. Would you consider sending Humalog 75/25 mix to replace it?

## 2022-05-06 ENCOUNTER — Other Ambulatory Visit: Payer: Self-pay

## 2022-05-06 DIAGNOSIS — E7521 Fabry (-Anderson) disease: Secondary | ICD-10-CM | POA: Diagnosis not present

## 2022-05-12 ENCOUNTER — Other Ambulatory Visit: Payer: Self-pay

## 2022-05-12 ENCOUNTER — Encounter: Payer: Self-pay | Admitting: Internal Medicine

## 2022-05-20 DIAGNOSIS — E7521 Fabry (-Anderson) disease: Secondary | ICD-10-CM | POA: Diagnosis not present

## 2022-05-30 ENCOUNTER — Other Ambulatory Visit: Payer: Self-pay | Admitting: Internal Medicine

## 2022-05-30 DIAGNOSIS — E1159 Type 2 diabetes mellitus with other circulatory complications: Secondary | ICD-10-CM

## 2022-06-03 DIAGNOSIS — E7521 Fabry (-Anderson) disease: Secondary | ICD-10-CM | POA: Diagnosis not present

## 2022-06-05 ENCOUNTER — Other Ambulatory Visit: Payer: Self-pay | Admitting: Internal Medicine

## 2022-06-05 DIAGNOSIS — R3589 Other polyuria: Secondary | ICD-10-CM

## 2022-06-05 DIAGNOSIS — I10 Essential (primary) hypertension: Secondary | ICD-10-CM

## 2022-06-05 DIAGNOSIS — G463 Brain stem stroke syndrome: Secondary | ICD-10-CM

## 2022-06-06 NOTE — Telephone Encounter (Signed)
Requested Prescriptions  Pending Prescriptions Disp Refills   furosemide (LASIX) 20 MG tablet [Pharmacy Med Name: FUROSEMIDE 20 MG TABLET] 90 tablet     Sig: TAKE 1 TABLET BY MOUTH EVERY DAY     Cardiovascular:  Diuretics - Loop Failed - 06/05/2022  9:02 AM      Failed - K in normal range and within 180 days    Potassium  Date Value Ref Range Status  10/26/2021 4.3 3.5 - 5.2 mmol/L Final         Failed - Ca in normal range and within 180 days    Calcium  Date Value Ref Range Status  10/26/2021 9.8 8.6 - 10.2 mg/dL Final   Calcium, Ion  Date Value Ref Range Status  05/22/2019 1.16 1.15 - 1.40 mmol/L Final         Failed - Na in normal range and within 180 days    Sodium  Date Value Ref Range Status  10/26/2021 149 (H) 134 - 144 mmol/L Final         Failed - Cr in normal range and within 180 days    Creat  Date Value Ref Range Status  11/07/2014 1.28 0.50 - 1.35 mg/dL Final   Creatinine, Ser  Date Value Ref Range Status  10/26/2021 1.86 (H) 0.76 - 1.27 mg/dL Final   Creatinine, Urine  Date Value Ref Range Status  08/30/2014 72.9 mg/dL Final    Comment:    No reference range established.         Failed - Cl in normal range and within 180 days    Chloride  Date Value Ref Range Status  10/26/2021 109 (H) 96 - 106 mmol/L Final         Failed - Mg Level in normal range and within 180 days    Magnesium  Date Value Ref Range Status  07/30/2013 1.9 1.5 - 2.5 mg/dL Final         Failed - Valid encounter within last 6 months    Recent Outpatient Visits           7 months ago Type 2 diabetes mellitus with peripheral neuropathy [facility])   [place], Neoma Laming B, MD   11 months ago Type 2 diabetes mellitus with other circulatory complication, with long-term current use of insulin ([place])   [place] Ladell Pier, MD   1 year ago Need for vaccination against Streptococcus pneumoniae   [place], Jarome Matin, RPH-CPP   1 year ago Type 2 diabetes mellitus with other circulatory complication, with long-term current use of insulin ([place])   [place], Deborah B, MD   1 year ago Type 2 diabetes mellitus with other circulatory complication, with long-term current use of insulin ([place])   [place], MD       Future Appointments             In 2 weeks Ladell Pier, MD [place] - Last BP in normal range    BP Readings from Last 1 Encounters:  12/29/21 130/63          valsartan-hydrochlorothiazide (DIOVAN-HCT) 80-12.5 MG tablet [Pharmacy Med Name: VALSARTAN-HCTZ 80-12.5 MG TAB] 90 tablet     Sig: TAKE 1 TABLET BY  MOUTH EVERY DAY     Cardiovascular: ARB + Diuretic Combos Failed - 06/05/2022  9:02 AM      Failed - K in normal range and within 180 days    Potassium  Date Value Ref Range Status  10/26/2021 4.3 3.5 - 5.2 mmol/L Final         Failed - Na in normal range and within 180 days    Sodium  Date Value Ref Range Status  10/26/2021 149 (H) 134 - 144 mmol/L Final         Failed - Cr in normal range and within 180 days    Creat  Date Value Ref Range Status  11/07/2014 1.28 0.50 - 1.35 mg/dL Final   Creatinine, Ser  Date Value Ref Range Status  10/26/2021 1.86 (H) 0.76 - 1.27 mg/dL Final   Creatinine, Urine  Date Value Ref Range Status  08/30/2014 72.9 mg/dL Final    Comment:    No reference range established.         Failed - eGFR is 10 or above and within 180 days    GFR, Est African American  Date Value Ref Range Status  11/07/2014 73 mL/min Final   GFR calc Af Amer  Date Value Ref Range Status  07/08/2020 42 (L) >59 mL/min/1.73 Final    Comment:    **In accordance with recommendations from the NKF-ASN Task force,**   [facility] is in the process of updating its eGFR  calculation to the   2021 CKD-EPI creatinine equation that estimates kidney function   without a race variable.    GFR, Est Non African American  Date Value Ref Range Status  11/07/2014 63 mL/min Final    Comment:      The estimated GFR is a calculation valid for adults (>=31 years old) that uses the CKD-EPI algorithm to adjust for age and sex. It is   not to be used for children, pregnant women, hospitalized patients,    patients on dialysis, or with rapidly changing kidney function. According to the NKDEP, eGFR >89 is normal, 60-89 shows mild impairment, 30-59 shows moderate impairment, 15-29 shows severe impairment and <15 is ESRD.      GFR calc non Af Amer  Date Value Ref Range Status  07/08/2020 36 (L) >59 mL/min/1.73 Final   eGFR  Date Value Ref Range Status  10/26/2021 41 (L) >59 mL/min/1.73 Final         Failed - Valid encounter within last 6 months    Recent Outpatient Visits           7 months ago Type 2 diabetes mellitus with peripheral neuropathy [facility])   [place], Neoma Laming B, MD   11 months ago Type 2 diabetes mellitus with other circulatory complication, with long-term current use of insulin ([place])   [place] Ladell Pier, MD   1 year ago Need for vaccination against Streptococcus pneumoniae   [place], RPH-CPP   1 year ago Type 2 diabetes mellitus with other circulatory complication, with long-term current use of insulin [facility])   [place] Karle Plumber B, MD   1 year ago Type 2 diabetes mellitus with other circulatory complication, with long-term current use of insulin [facility])   [place], Deborah B, MD       Future Appointments  In 2 weeks Ladell Pier, MD [place] - Patient is  not pregnant      Passed - Last BP in normal range    BP Readings from Last 1 Encounters:  12/29/21 130/63          amLODipine (NORVASC) 10 MG tablet [Pharmacy Med Name: AMLODIPINE BESYLATE 10 MG TAB] 30 tablet 0    Sig: TAKE 1 TABLET BY MOUTH EVERY DAY     Cardiovascular: Calcium Channel Blockers 2 Failed - 06/05/2022  9:02 AM      Failed - Valid encounter within last 6 months    Recent Outpatient Visits           7 months ago Type 2 diabetes mellitus with peripheral neuropathy ([place])   [place] Ladell Pier, MD   11 months ago Type 2 diabetes mellitus with other circulatory complication, with long-term current use of insulin ([place])   [place], Deborah B, MD   1 year ago Need for vaccination against Streptococcus pneumoniae   [place], Stephen L, RPH-CPP   1 year ago Type 2 diabetes mellitus with other circulatory complication, with long-term current use of insulin ([place])   [place], Deborah B, MD   1 year ago Type 2 diabetes mellitus with other circulatory complication, with long-term current use of insulin ([place])   [place], MD       Future Appointments             In 2 weeks Ladell Pier, MD [place] BP in normal range    BP Readings from Last 1 Encounters:  12/29/21 130/63         Passed - Last Heart Rate in normal range    Pulse Readings from Last 1 Encounters:  12/29/21 97          clopidogrel (PLAVIX) 75 MG tablet [Pharmacy Med Name: CLOPIDOGREL 75 MG TABLET] 90 tablet     Sig: TAKE 1 TABLET BY [place] DAY     Hematology: Antiplatelets - clopidogrel Failed - 06/05/2022  9:02 AM      Failed - HCT in normal range and within 180 days    Hematocrit  Date Value Ref Range Status   02/23/2021 44.5 37.5 - 51.0 % Final         Failed - HGB in normal range and within 180 days    Hemoglobin  Date Value Ref Range Status  02/23/2021 15.0 13.0 - 17.7 g/dL Final   Total hemoglobin  Date Value Ref Range Status  07/26/2013 14.2 13.5 - 18.0 g/dL Final         Failed - PLT in normal range and within 180 days    Platelets  Date Value Ref Range Status  02/23/2021 198 150 - 450 x10E3/uL Final         Failed - Cr in normal range and within 360 days    Creat  Date Value Ref Range Status  11/07/2014 1.28 0.50 - 1.35 mg/dL Final   Creatinine, Ser  Date Value Ref Range Status  10/26/2021 1.86 (H) 0.76 - 1.27 mg/dL Final   Creatinine,  Urine  Date Value Ref Range Status  08/30/2014 72.9 mg/dL Final    Comment:    No reference range established.         Failed - Valid encounter within last 6 months    Recent Outpatient Visits           7 months ago Type 2 diabetes mellitus with peripheral neuropathy ([place])   [place], Neoma Laming B, MD   11 months ago Type 2 diabetes mellitus with other circulatory complication, with long-term current use of insulin ([place])   [place], MD   1 year ago Need for vaccination against Streptococcus pneumoniae   [place], Stephen L, RPH-CPP   1 year ago Type 2 diabetes mellitus with other circulatory complication, with long-term current use of insulin ([place])   [place], Deborah B, MD   1 year ago Type 2 diabetes mellitus with other circulatory complication, with long-term current use of insulin ([place])   [place], Deborah B, MD       Future Appointments             In 2 weeks Ladell Pier, MD [place]             doxazosin (CARDURA) 2 MG tablet [Pharmacy Med Name: DOXAZOSIN  MESYLATE 2 MG TAB] 15 tablet 0    Sig: TAKE 1/2 TABLET BY MOUTH DAILY     Cardiovascular:  Alpha Blockers Failed - 06/05/2022  9:02 AM      Failed - Valid encounter within last 6 months    Recent Outpatient Visits           7 months ago Type 2 diabetes mellitus with peripheral neuropathy [facility])   [place], Neoma Laming B, MD   11 months ago Type 2 diabetes mellitus with other circulatory complication, with long-term current use of insulin ([place])   [place] Ladell Pier, MD   1 year ago Need for vaccination against Streptococcus pneumoniae   [place], Jarome Matin, RPH-CPP   1 year ago Type 2 diabetes mellitus with other circulatory complication, with long-term current use of insulin [facility])   [place], Deborah B, MD   1 year ago Type 2 diabetes mellitus with other circulatory complication, with long-term current use of insulin [facility]   [place], MD       Future Appointments             In 2 weeks Ladell Pier, MD [place] - Last BP in normal range    BP Readings from Last 1 Encounters:  12/29/21 130/63

## 2022-06-06 NOTE — Telephone Encounter (Signed)
Requested medication (s) are due for refill today: yes  Requested medication (s) are on the active medication list: yes  Last refill:  Furosemide: 02/10/22 #90       Valsartan- HCTZ: 02/10/22 #90         clopidogrel: 02/10/22 #90  Future visit scheduled: yes  Notes to clinic:  overdue lab work   Requested Prescriptions  Pending Prescriptions Disp Refills   furosemide (LASIX) 20 MG tablet [Pharmacy Med Name: FUROSEMIDE 20 MG TABLET] 90 tablet     Sig: TAKE 1 TABLET BY MOUTH EVERY DAY     Cardiovascular:  Diuretics - Loop Failed - 06/05/2022  9:02 AM      Failed - K in normal range and within 180 days    Potassium  Date Value Ref Range Status  10/26/2021 4.3 3.5 - 5.2 mmol/L Final         Failed - Ca in normal range and within 180 days    Calcium  Date Value Ref Range Status  10/26/2021 9.8 8.6 - 10.2 mg/dL Final   Calcium, Ion  Date Value Ref Range Status  05/22/2019 1.16 1.15 - 1.40 mmol/L Final         Failed - Na in normal range and within 180 days    Sodium  Date Value Ref Range Status  10/26/2021 149 (H) 134 - 144 mmol/L Final         Failed - Cr in normal range and within 180 days    Creat  Date Value Ref Range Status  11/07/2014 1.28 0.50 - 1.35 mg/dL Final   Creatinine, Ser  Date Value Ref Range Status  10/26/2021 1.86 (H) 0.76 - 1.27 mg/dL Final   Creatinine, Urine  Date Value Ref Range Status  08/30/2014 72.9 mg/dL Final    Comment:    No reference range established.         Failed - Cl in normal range and within 180 days    Chloride  Date Value Ref Range Status  10/26/2021 109 (H) 96 - 106 mmol/L Final         Failed - Mg Level in normal range and within 180 days    Magnesium  Date Value Ref Range Status  07/30/2013 1.9 1.5 - 2.5 mg/dL Final         Failed - Valid encounter within last 6 months    Recent Outpatient Visits           7 months ago Type 2 diabetes mellitus with peripheral neuropathy [facility])   [place], Neoma Laming B, MD   11 months ago Type 2 diabetes mellitus with other circulatory complication, with long-term current use of insulin ([place])   [place] Ladell Pier, MD   1 year ago Need for vaccination against Streptococcus pneumoniae   [place], Jarome Matin, RPH-CPP   1 year ago Type 2 diabetes mellitus with other circulatory complication, with long-term current use of insulin [facility])   [place], Deborah B, MD   1 year ago Type 2 diabetes mellitus with other circulatory complication, with long-term current use of insulin [facility])   [place], MD       Future Appointments             In 2 weeks Ladell Pier, MD [facility]            Passed - Last BP in normal range    BP Readings from Last 1 Encounters:  12/29/21 130/63          valsartan-hydrochlorothiazide (DIOVAN-HCT) 80-12.5 MG tablet [Pharmacy Med Name: VALSARTAN-HCTZ 80-12.5 MG TAB] 90 tablet     Sig: TAKE 1 TABLET BY MOUTH EVERY DAY     Cardiovascular: ARB + Diuretic Combos Failed - 06/05/2022  9:02 AM      Failed - K in normal range and within 180 days    Potassium  Date Value Ref Range Status  10/26/2021 4.3 3.5 - 5.2 mmol/L Final         Failed - Na in normal range and within 180 days    Sodium  Date Value Ref Range Status  10/26/2021 149 (H) 134 - 144 mmol/L Final         Failed - Cr in normal range and within 180 days    Creat  Date Value Ref Range Status  11/07/2014 1.28 0.50 - 1.35 mg/dL Final   Creatinine, Ser  Date Value Ref Range Status  10/26/2021 1.86 (H) 0.76 - 1.27 mg/dL Final   Creatinine, Urine  Date Value Ref Range Status  08/30/2014 72.9 mg/dL Final    Comment:    No reference range established.         Failed - eGFR is 10 or above and within 180 days    GFR, Est  African American  Date Value Ref Range Status  11/07/2014 73 mL/min Final   GFR calc Af Amer  Date Value Ref Range Status  07/08/2020 42 (L) >59 mL/min/1.73 Final    Comment:    **In accordance with recommendations from the NKF-ASN Task force,**   [facility] is in the process of updating its eGFR calculation to the   2021 CKD-EPI creatinine equation that estimates kidney function   without a race variable.    GFR, Est Non African American  Date Value Ref Range Status  11/07/2014 63 mL/min Final    Comment:      The estimated GFR is a calculation valid for adults (>=62 years old) that uses the CKD-EPI algorithm to adjust for age and sex. It is   not to be used for children, pregnant women, hospitalized patients,    patients on dialysis, or with rapidly changing kidney function. According to the NKDEP, eGFR >89 is normal, 60-89 shows mild impairment, 30-59 shows moderate impairment, 15-29 shows severe impairment and <15 is ESRD.      GFR calc non Af Amer  Date Value Ref Range Status  07/08/2020 36 (L) >59 mL/min/1.73 Final   eGFR  Date Value Ref Range Status  10/26/2021 41 (L) >59 mL/min/1.73 Final         Failed - Valid encounter within last 6 months    Recent Outpatient Visits           7 months ago Type 2 diabetes mellitus with peripheral neuropathy [facility])   [place], Neoma Laming B, MD   11 months ago Type 2 diabetes mellitus with other circulatory complication, with long-term current use of insulin [facility])   [place], MD   1 year ago Need for vaccination against Streptococcus pneumoniae   [place], RPH-CPP   1 year ago Type 2 diabetes mellitus with other circulatory complication, with  long-term current use of insulin ([place])   [place] Ladell Pier, MD   1 year ago Type 2 diabetes mellitus  with other circulatory complication, with long-term current use of insulin ([place])   [place], MD       Future Appointments             In 2 weeks Ladell Pier, MD [place] - Patient is not pregnant      Passed - Last BP in normal range    BP Readings from Last 1 Encounters:  12/29/21 130/63          clopidogrel (PLAVIX) 75 MG tablet [Pharmacy Med Name: CLOPIDOGREL 75 MG TABLET] 90 tablet     Sig: TAKE 1 TABLET BY MOUTH [place] DAY     Hematology: Antiplatelets - clopidogrel Failed - 06/05/2022  9:02 AM      Failed - HCT in normal range and within 180 days    Hematocrit  Date Value Ref Range Status  02/23/2021 44.5 37.5 - 51.0 % Final         Failed - HGB in normal range and within 180 days    Hemoglobin  Date Value Ref Range Status  02/23/2021 15.0 13.0 - 17.7 g/dL Final   Total hemoglobin  Date Value Ref Range Status  07/26/2013 14.2 13.5 - 18.0 g/dL Final         Failed - PLT in normal range and within 180 days    Platelets  Date Value Ref Range Status  02/23/2021 198 150 - 450 x10E3/uL Final         Failed - Cr in normal range and within 360 days    Creat  Date Value Ref Range Status  11/07/2014 1.28 0.50 - 1.35 mg/dL Final   Creatinine, Ser  Date Value Ref Range Status  10/26/2021 1.86 (H) 0.76 - 1.27 mg/dL Final   Creatinine, Urine  Date Value Ref Range Status  08/30/2014 72.9 mg/dL Final    Comment:    No reference range established.         Failed - Valid encounter within last 6 months    Recent Outpatient Visits           7 months ago Type 2 diabetes mellitus with peripheral neuropathy [facility])   [place] Karle Plumber B, MD   11 months ago Type 2 diabetes mellitus with other circulatory complication, with long-term current use of insulin [facility])   [place], MD   1 year ago Need for vaccination against Streptococcus pneumoniae   [place], Stephen L, RPH-CPP   1 year ago Type 2 diabetes mellitus with other circulatory complication, with long-term current use of insulin [facility])   [place], Deborah B, MD   1 year ago Type 2 diabetes mellitus with other circulatory complication, with long-term current use of insulin [facility])   [place], MD       Future Appointments             In 2 weeks Ladell Pier, MD [place]            Signed Prescriptions  Disp Refills   amLODipine (NORVASC) 10 MG tablet 30 tablet 0    Sig: TAKE 1 TABLET BY MOUTH EVERY DAY     Cardiovascular: Calcium Channel Blockers 2 Failed - 06/05/2022  9:02 AM      Failed - Valid encounter within last 6 months    Recent Outpatient Visits           7 months ago Type 2 diabetes mellitus with peripheral neuropathy ([place])   [place], Neoma Laming B, MD   11 months ago Type 2 diabetes mellitus with other circulatory complication, with long-term current use of insulin ([place])   [place], Deborah B, MD   1 year ago Need for vaccination against Streptococcus pneumoniae   [place], Annie Main L, RPH-CPP   1 year ago Type 2 diabetes mellitus with other circulatory complication, with long-term current use of insulin ([place])   [place], Deborah B, MD   1 year ago Type 2 diabetes mellitus with other circulatory complication, with long-term current use of insulin ([place])   [place], MD       Future Appointments             In 2 weeks Ladell Pier, MD [place] BP in normal range    BP Readings from Last 1 Encounters:  12/29/21 130/63         Passed - Last Heart Rate in normal range    Pulse Readings from Last 1 Encounters:  12/29/21 97          doxazosin (CARDURA) 2 MG tablet 15 tablet 0    Sig: TAKE 1/2 TABLET BY MOUTH DAILY     Cardiovascular:  Alpha Blockers Failed - 06/05/2022  9:02 AM      Failed - Valid encounter within last 6 months    Recent Outpatient Visits           7 months ago Type 2 diabetes mellitus with peripheral neuropathy [facility])   [place], Neoma Laming B, MD   11 months ago Type 2 diabetes mellitus with other circulatory complication, with long-term current use of insulin ([place])   [place] Ladell Pier, MD   1 year ago Need for vaccination against Streptococcus pneumoniae   [place], Jarome Matin, RPH-CPP   1 year ago Type 2 diabetes mellitus with other circulatory complication, with long-term current use of insulin [facility])   [place], Deborah B, MD   1 year ago Type 2 diabetes mellitus with other circulatory complication, with long-term current use of insulin [facility])   [place], MD       Future Appointments             In 2 weeks Ladell Pier, MD [place] BP in normal range    BP Readings from Last 1 Encounters:  12/29/21 130/63

## 2022-06-18 DIAGNOSIS — E7521 Fabry (-Anderson) disease: Secondary | ICD-10-CM | POA: Diagnosis not present

## 2022-06-21 ENCOUNTER — Ambulatory Visit (INDEPENDENT_AMBULATORY_CARE_PROVIDER_SITE_OTHER): Payer: Medicare HMO

## 2022-06-21 ENCOUNTER — Telehealth: Payer: Self-pay | Admitting: Licensed Clinical Social Worker

## 2022-06-21 DIAGNOSIS — I441 Atrioventricular block, second degree: Secondary | ICD-10-CM

## 2022-06-22 LAB — CUP PACEART REMOTE DEVICE CHECK
Battery Remaining Longevity: 57 mo
Battery Remaining Percentage: 59 %
Battery Voltage: 2.99 V
Brady Statistic AP VP Percent: 77 %
Brady Statistic AP VS Percent: 1 %
Brady Statistic AS VP Percent: 22 %
Brady Statistic AS VS Percent: 1 %
Brady Statistic RA Percent Paced: 76 %
Brady Statistic RV Percent Paced: 99 %
Date Time Interrogation Session: 20240115020015
Implantable Lead Connection Status: 753985
Implantable Lead Connection Status: 753985
Implantable Lead Implant Date: 20120907
Implantable Lead Implant Date: 20120907
Implantable Lead Location: 753859
Implantable Lead Location: 753860
Implantable Pulse Generator Implant Date: 20201215
Lead Channel Impedance Value: 350 Ohm
Lead Channel Impedance Value: 400 Ohm
Lead Channel Pacing Threshold Amplitude: 0.625 V
Lead Channel Pacing Threshold Amplitude: 1.375 V
Lead Channel Pacing Threshold Pulse Width: 0.5 ms
Lead Channel Pacing Threshold Pulse Width: 0.8 ms
Lead Channel Sensing Intrinsic Amplitude: 5 mV
Lead Channel Sensing Intrinsic Amplitude: 9.2 mV
Lead Channel Setting Pacing Amplitude: 1.625
Lead Channel Setting Pacing Amplitude: 1.625
Lead Channel Setting Pacing Pulse Width: 0.8 ms
Lead Channel Setting Sensing Sensitivity: 4 mV
Pulse Gen Model: 2272
Pulse Gen Serial Number: 9187005

## 2022-06-22 NOTE — Patient Outreach (Signed)
  Care Coordination   Follow Up Visit Note   06/22/2022 Name: DEVERY MURGIA MRN: [id number] DOB: 01/16/1961  AYOMIKUN STARLING is a 62 y.o. year old male who sees Ladell Pier, MD for primary care. I spoke with  [name] by phone today.  What matters to the patients health and wellness today?  Resources    Goals Addressed             This Visit's Progress    Obtain Supportive Resources-Surgery Support   On track    Care Coordination Interventions: Solution-Focused Strategies employed:  Active listening / Reflection utilized  Emotional Support Provided Verbalization of feelings encouraged  Patient requests contact information for [facility] LCSW reviewed information for non-medical aid assistance and e-mailed contact info Patient agreed to contact agency and will provide updated info at next scheduled appt with LCSW         SDOH assessments and interventions completed:  No     Care Coordination Interventions:  Yes, provided   Follow up plan: Follow up call scheduled for 1 week    Encounter Outcome:  Pt. Visit Completed   [name], MSW, [email or web address] Phone [phone] 6:47 PM

## 2022-06-22 NOTE — Patient Instructions (Signed)
Visit Information  Thank you for taking time to visit with me today. Please don't hesitate to contact me if I can be of assistance to you.   Following are the goals we discussed today:   Goals Addressed             This Visit's Progress    Obtain Supportive Resources-Surgery Support   On track    Care Coordination Interventions: Solution-Focused Strategies employed:  Active listening / Reflection utilized  Emotional Support Provided Verbalization of feelings encouraged  Patient requests contact information for [facility] LCSW reviewed information for non-medical aid assistance and e-mailed contact info Patient agreed to contact agency and will provide updated info at next scheduled appt with LCSW         Our next appointment is by telephone on 07/05/22 at 10 AM  Please call the care guide team at [phone] if you need to cancel or reschedule your appointment.   If you are experiencing a Mental Health or [place] or need someone to talk to, please call the Suicide and Crisis Lifeline: 988 call 911   Patient verbalizes understanding of instructions and care plan provided today and agrees to view in [place]. Active MyChart status and patient understanding of how to access instructions and care plan via MyChart confirmed with patient.     [name], MSW, [email or web address] Phone [phone] 6:48 PM

## 2022-06-25 ENCOUNTER — Encounter: Payer: Self-pay | Admitting: Internal Medicine

## 2022-06-25 ENCOUNTER — Ambulatory Visit: Payer: Medicare HMO | Attending: Internal Medicine | Admitting: Internal Medicine

## 2022-06-25 VITALS — BP 121/74 | HR 75 | Temp 97.7°F | Ht 70.0 in | Wt 276.0 lb

## 2022-06-25 DIAGNOSIS — E7521 Fabry (-Anderson) disease: Secondary | ICD-10-CM

## 2022-06-25 DIAGNOSIS — R159 Full incontinence of feces: Secondary | ICD-10-CM | POA: Diagnosis not present

## 2022-06-25 DIAGNOSIS — Z2821 Immunization not carried out because of patient refusal: Secondary | ICD-10-CM | POA: Diagnosis not present

## 2022-06-25 DIAGNOSIS — I4892 Unspecified atrial flutter: Secondary | ICD-10-CM

## 2022-06-25 DIAGNOSIS — G463 Brain stem stroke syndrome: Secondary | ICD-10-CM

## 2022-06-25 DIAGNOSIS — I152 Hypertension secondary to endocrine disorders: Secondary | ICD-10-CM | POA: Diagnosis not present

## 2022-06-25 DIAGNOSIS — I6930 Unspecified sequelae of cerebral infarction: Secondary | ICD-10-CM

## 2022-06-25 DIAGNOSIS — R3589 Other polyuria: Secondary | ICD-10-CM | POA: Diagnosis not present

## 2022-06-25 DIAGNOSIS — I5032 Chronic diastolic (congestive) heart failure: Secondary | ICD-10-CM | POA: Diagnosis not present

## 2022-06-25 DIAGNOSIS — E1159 Type 2 diabetes mellitus with other circulatory complications: Secondary | ICD-10-CM

## 2022-06-25 DIAGNOSIS — N1832 Chronic kidney disease, stage 3b: Secondary | ICD-10-CM

## 2022-06-25 DIAGNOSIS — Z794 Long term (current) use of insulin: Secondary | ICD-10-CM

## 2022-06-25 LAB — POCT URINALYSIS DIP (CLINITEK)
Bilirubin, UA: NEGATIVE
Blood, UA: NEGATIVE
Glucose, UA: 250 mg/dL — AB
Ketones, POC UA: NEGATIVE mg/dL
Leukocytes, UA: NEGATIVE
Nitrite, UA: NEGATIVE
POC PROTEIN,UA: 30 — AB
Spec Grav, UA: 1.015 (ref 1.010–1.025)
Urobilinogen, UA: 0.2 E.U./dL
pH, UA: 6 (ref 5.0–8.0)

## 2022-06-25 LAB — POCT GLYCOSYLATED HEMOGLOBIN (HGB A1C): HbA1c, POC (controlled diabetic range): 11.8 % — AB (ref 0.0–7.0)

## 2022-06-25 LAB — GLUCOSE, POCT (MANUAL RESULT ENTRY): POC Glucose: 426 mg/dl — AB (ref 70–99)

## 2022-06-25 MED ORDER — VALSARTAN-HYDROCHLOROTHIAZIDE 80-12.5 MG PO TABS: 1.0000 | ORAL_TABLET | Freq: Every day | ORAL | 1 refills | Status: DC

## 2022-06-25 MED ORDER — NOVOLOG MIX 70/30 FLEXPEN (70-30) 100 UNIT/ML ~~LOC~~ SUPN: 28.0000 [IU] | PEN_INJECTOR | Freq: Two times a day (BID) | SUBCUTANEOUS | 6 refills | Status: DC

## 2022-06-25 MED ORDER — FREESTYLE LIBRE 2 READER DEVI: 0 refills | Status: DC

## 2022-06-25 MED ORDER — RYBELSUS 3 MG PO TABS: 3.0000 mg | ORAL_TABLET | Freq: Every day | ORAL | 2 refills | Status: DC

## 2022-06-25 MED ORDER — FREESTYLE LIBRE SENSOR SYSTEM MISC: 12 refills | Status: DC

## 2022-06-25 MED ORDER — CLOPIDOGREL BISULFATE 75 MG PO TABS: 75.0000 mg | ORAL_TABLET | Freq: Every day | ORAL | 1 refills | Status: DC

## 2022-06-25 MED ORDER — FUROSEMIDE 20 MG PO TABS: 20.0000 mg | ORAL_TABLET | Freq: Every day | ORAL | 1 refills | Status: DC

## 2022-06-25 MED ORDER — AMLODIPINE BESYLATE 10 MG PO TABS: 10.0000 mg | ORAL_TABLET | Freq: Every day | ORAL | 1 refills | Status: DC

## 2022-06-25 MED ORDER — DOXAZOSIN MESYLATE 2 MG PO TABS: 1.0000 mg | ORAL_TABLET | Freq: Every day | ORAL | 1 refills | Status: DC

## 2022-06-25 NOTE — Progress Notes (Signed)
Patient ID: ANAV LAMMERT, male    DOB: Mar 07, 1961  MRN: [id number]  CC: Diabetes (DM f/u. /No to flu vax. )   Subjective: [name] is a 62 y.o. male who presents for chronic ds management His concerns today include:  Patient with history of diabetes type 2 with microalbuminuria, diastolic CHF with pacemaker (secconary to AV block with syncopy), CKD stage III, HOCM, HTN, HL, CVA, lymphedema. 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    DM: Results for orders placed or performed in visit on 06/25/22  POCT glucose (manual entry)  Result Value Ref Range   POC Glucose 426 (A) 70 - 99 mg/dl  POCT glycosylated hemoglobin (Hb A1C)  Result Value Ref Range   Hemoglobin A1C     HbA1c POC (<> result, manual entry)     HbA1c, POC (prediabetic range)     HbA1c, POC (controlled diabetic range) 11.8 (A) 0.0 - 7.0 %  He is surprised at his A1C today Checks BS mainly in a.m.  A.M BS below 120, before dinner around 130. Thinks he is doing okay with eating habits.  Sees a nutritionist and now pays attention to food labels Out of Rybelsus 3 mg daily since November.  Taking Novolog 70/30 24 units BID Down 6 lbs since 02/2022.  Endorses frequent urination during the day  HTN/diastolic CHF:  BP good today.  Reports compliance with amlodipine 10 mg daily, doxazosin 2 mg half a tablet daily, furosemide 20 mg daily, Diovan/HCTZ 80/12.5 mg daily No CP/SOB.   Gets stabbing pain in arms at times Last seen in cardiology device clinic for interrogation of his PPM device 12/2021.  This showed brief episode of a flutter lasting 34 seconds and assessed to be subclinical. Saw neurologist since last visit.  He remains on Plavix.  Last saw Dr. Marval Regal 03/2022.  GFR at that time was 37 with creatinine of 1.9.  He continues to get infusions once every 2 weeks for Fabry's disease  Some bowel incontinence since last summer. Has to keep change of clothing with him when he leaves  the house.  He tries not to eat if he has to leave the house during the day so as not to have an incontinence accident.  Usually feels like he has to pass gas and when he does, he discovers fecal smearing.  I specifically inquired whether this continued even when he was off Rybelsus and pt said yes. Some urinary incontinence as well more so attributed to fluid pill  HL:  taking and tolerating Lipitor 80 mg  HM:  declines flu shot and 2nd Shingles vaccine Patient Active Problem List   Diagnosis Date Noted   Post-nasal drip 10/26/2021   Chronic sinusitis 07/01/2021   Contact dermatitis and other eczema 07/01/2021   Edema 07/01/2021   Other and unspecified hyperlipidemia 07/01/2021   Proteinuria 07/01/2021   Macroalbuminuric diabetic nephropathy ([place]) 11/08/2020   Diabetes mellitus ([place]) 11/07/2020   Hypertension associated with stage 3a chronic kidney disease due to type 2 diabetes mellitus ([place]) 11/07/2020   Influenza vaccine needed 07/08/2020   Spinal stenosis of lumbar region without neurogenic claudication 07/08/2020   Degenerative spondylolisthesis 05/20/2020   Acute left-sided low back pain with left-sided sciatica 05/08/2020   Reactive airway disease 01/24/2020   Pain due to onychomycosis of toenails of both feet 10/17/2019   Morbid obesity ([place]) 08/23/2019   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   CVA (cerebral vascular accident) ([place]) 10/20/2018  UTI (urinary tract infection) 10/19/2018   Upper airway cough syndrome 08/01/2018   Lymphedema 10/06/2016   Gout, arthritis    Anorectal fistula 06/23/2015   Onychomycosis of toenail 09/26/2014   Diabetes mellitus type 2 in obese [facility])    Iron deficiency anemia 07/29/2014   CKD (chronic kidney disease), stage III (HCC)    Chronic diastolic HF (heart failure) ([place]) 03/27/2013   Peripheral edema 07/20/2012   Obstructive sleep apnea, suspected 06/21/2012   Pacemaker-St.Jude 02/23/2012   Hypertension 08/09/2011   Mobitz type II  atrioventricular block 05/19/2011   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[place]) 05/19/2011     Current Outpatient Medications on File Prior to Visit  Medication Sig Dispense Refill   Accu-Chek FastClix Lancets MISC 1 each by Other route 3 (three) times daily. 102 each 12   acetaminophen (TYLENOL) 325 MG tablet Take 1-2 tablets (325-650 mg total) by mouth every 4 (four) hours as needed for mild pain.     Agalsidase beta (FABRAZYME IV) Inject 115 mg into the vein every 14 (fourteen) days.      albuterol (PROAIR HFA) 108 (90 Base) MCG/ACT inhaler Inhale 1 puff into the lungs every 6 (six) hours as needed for wheezing or shortness of breath. 8.5 each 1   allopurinol (ZYLOPRIM) 100 MG tablet TAKE 3 TABLETS BY MOUTH EVERY DAY 270 tablet 1   amLODipine (NORVASC) 10 MG tablet TAKE 1 TABLET BY MOUTH EVERY DAY 30 tablet 0   atorvastatin (LIPITOR) 80 MG tablet TAKE 1 TABLET BY MOUTH DAILY AT 6 PM. 90 tablet 2   clopidogrel (PLAVIX) 75 MG tablet TAKE 1 TABLET BY MOUTH EVERY DAY 30 tablet 0   doxazosin (CARDURA) 2 MG tablet TAKE 1/2 TABLET BY MOUTH DAILY 15 tablet 0   fexofenadine (ALLEGRA) 180 MG tablet Take 180 mg by mouth every 14 (fourteen) days.     fluticasone (FLONASE) 50 MCG/ACT nasal spray PLACE 1 SPRAY INTO BOTH NOSTRILS DAILY AS NEEDED FOR ALLERGIES. 48 mL 1   furosemide (LASIX) 20 MG tablet TAKE 1 TABLET BY MOUTH EVERY DAY 30 tablet 0   glucose blood (ACCU-CHEK AVIVA PLUS) test strip 1 each by Other route 3 (three) times daily. Use as instructed 100 each 12   insulin aspart protamine - aspart (NOVOLOG MIX 70/30 FLEXPEN) (70-30) 100 UNIT/ML FlexPen INJECT 28 UNITS INTO THE SKIN 2 (TWO) TIMES DAILY WITH A MEAL. 15 mL 0   Insulin Pen Needle (PX SHORTLENGTH PEN NEEDLES) 31G X 8 MM MISC 1 each by Does not apply route 2 (two) times daily before a meal. 100 each 12   mupirocin ointment (BACTROBAN) 2 % Apply to affected area daily with dressing change. 30 g 0   Prednisol Ace-Moxiflox-Bromfen 1-0.5-0.075 % SUSP Place 1 drop  into the right eye 4 (four) times daily. Start 1 day before the surgery on 10/19/21     valsartan-hydrochlorothiazide (DIOVAN-HCT) 80-12.5 MG tablet TAKE 1 TABLET BY MOUTH EVERY DAY 30 tablet 0   No current facility-administered medications on file prior to visit.    Allergies  Allergen Reactions   Lisinopril Anaphylaxis   Shellfish Allergy Hives and Swelling    Social History   Socioeconomic History   Marital status: Divorced    Spouse name: Not on file   Number of children: Not on file   Years of education: Not on file   Highest education level: Not on file  Occupational History   Not on file  Tobacco Use   Smoking status: Never   Smokeless  tobacco: Never  Vaping Use   Vaping Use: Never used  Substance and Sexual Activity   Alcohol use: Yes    Comment: 07/23/2014 "might have a few drinks on holidays or at cookouts"   Drug use: No   Sexual activity: Yes  Other Topics Concern   Not on file  Social History Narrative   Coaches [employer]    Social Determinants of Health   Financial Resource Strain: Not on file  Food Insecurity: No Food Insecurity (10/19/2018)   Hunger Vital Sign    Worried About Running Out of Food in the Last Year: Never true    Ran Out of Food in the Last Year: Never true  Transportation Needs: No Transportation Needs (02/12/2022)   PRAPARE - Hydrologist (Medical): No    Lack of Transportation (Non-Medical): No  Physical Activity: Insufficiently Active (10/19/2018)   Exercise Vital Sign    Days of Exercise per Week: 2 days    Minutes of Exercise per Session: 20 min  Stress: No Stress Concern Present (10/19/2018)   [place]    Feeling of Stress : Only a little  Social Connections: Moderately Integrated (10/19/2018)   Social Connection and Isolation Panel [NHANES]    Frequency of Communication with Friends and Family: Twice a week    Frequency of  Social Gatherings with Friends and Family: Once a week    Attends Religious Services: More than 4 times per year    Active Member of Clubs or Organizations: Yes    Attends Archivist Meetings: 1 to 4 times per year    Marital Status: Divorced  Intimate Partner Violence: Not At Risk (10/19/2018)   Humiliation, Afraid, Rape, and Kick questionnaire    Fear of Current or Ex-Partner: No    Emotionally Abused: No    Physically Abused: No    Sexually Abused: No    Family History  Problem Relation Age of Onset   Stroke Mother    Fabry's disease Mother    51 disease Brother    Colon cancer Neg Hx     Past Surgical History:  Procedure Laterality Date   CARDIAC CATHETERIZATION  03-12-2011  DR Superior WITH LV CAVITY  APPEARANCE CONSISTANT WITH SIGNIFICANT APICAL HYPERTROPHY/ NORMAL LVSF / EF 55%/ EVIDENCE OF DIASTOLIC DYSFUNCTION WITH EDP OF 23-71mHg AFTER A-WAVE/ NORMAL CORONARY ARTERIES   EVALUATION UNDER ANESTHESIA WITH FISTULECTOMY N/A 06/23/2015   Procedure: EXAM UNDER ANESTHESIA WITH POSSIBLE FISTULOTOMY;  Surgeon: JJudeth Horn MD;  [facility]  Service: General;  Laterality: N/A;   HERNIA REPAIR  [id number]  Umbilical Hernia Repair   INCISION AND DRAINAGE PERIRECTAL ABSCESS Left 07/29/2014   Procedure: IRRIGATION AND DEBRIDEMENT PERIRECTAL ABSCESS;  Surgeon: JDoreen Salvage MD;  Location: [facility]  Service: General;  Laterality: Left;  Prone position   INSERT / REPLACE / REMOVE PACEMAKER  02/12/2011   SJM implanted by Dr ARayann Hemanfor Mobitz II second degree AV block and syncope   IRRIGATION AND DEBRIDEMENT ABSCESS N/A 07/27/2013   Procedure: IRRIGATION AND DEBRIDEMENT OF SKIN, SOFT TISSUE AND MUSCLES OF UPPER BACK (11X19X4cm) WITH 10 BLADE AND PULSATILE LAVAGE ;  Surgeon: EGayland Curry MD;  [facility]  Service: General;  Laterality: N/A;   PPM GENERATOR CHANGEOUT N/A 05/22/2019   Procedure: PPM GENERATOR CHANGEOUT;  Surgeon: AThompson Grayer MD;   [facility];  Service: Cardiovascular;  Laterality: N/A;   UMBILICAL HERNIA REPAIR  03/22/2012   Procedure: HERNIA REPAIR UMBILICAL ADULT;  Surgeon: Leighton Ruff, MD;  Location: [facility];  Service: General;  Laterality: N/A;  Umbilical Hernia Repair     ROS: Review of Systems Negative except as stated above  PHYSICAL EXAM: BP 121/74 (BP Location: Left Arm, Patient Position: Sitting, Cuff Size: Large)   Pulse 75   Temp 97.7 F (36.5 C) (Oral)   Ht '5\' 10"'$  (1.778 m)   Wt 276 lb (125.2 kg)   SpO2 96%   BMI 39.60 kg/m   Wt Readings from Last 3 Encounters:  06/25/22 276 lb (125.2 kg)  03/04/22 282 lb (127.9 kg)  12/29/21 287 lb 3.2 oz (130.3 kg)    Physical Exam   General appearance - alert, well appearing, older obese AAM and in no distress.  Ambulates with a cane Mental status - normal mood, behavior, speech, dress, motor activity, and thought processes Neck - supple, no significant adenopathy Chest - clear to auscultation, no wheezes, rales or rhonchi, symmetric air entry Heart - normal rate, regular rhythm, normal S1, S2, no murmurs, rubs, clicks or gallops Extremities - chronic lymphedema     Latest Ref Rng & Units 10/26/2021    9:45 AM 02/23/2021   10:26 AM 07/08/2020   10:48 AM  CMP  Glucose 70 - 99 mg/dL 111  169  163   BUN 8 - 27 mg/dL '20  27  23   '$ Creatinine 0.76 - 1.27 mg/dL 1.86  1.88  1.96   Sodium 134 - 144 mmol/L 149  146  143   Potassium 3.5 - 5.2 mmol/L 4.3  4.7  4.6   Chloride 96 - 106 mmol/L 109  107  103   CO2 20 - 29 mmol/L '22  24  24   '$ Calcium 8.6 - 10.2 mg/dL 9.8  9.7  10.0   Total Protein 6.0 - 8.5 g/dL 7.8   7.8   Total Bilirubin 0.0 - 1.2 mg/dL 0.5   0.8   Alkaline Phos 44 - 121 IU/L 138   119   AST 0 - 40 IU/L 33   35   ALT 0 - 44 IU/L 32   38    Lipid Panel     Component Value Date/Time   CHOL 168 10/26/2021 0945   TRIG 80 10/26/2021 0945   HDL 62 10/26/2021 0945   CHOLHDL 2.7 10/26/2021 0945   CHOLHDL 4.4 10/20/2018 0324    VLDL 18 10/20/2018 0324   LDLCALC 91 10/26/2021 0945    CBC    Component Value Date/Time   WBC 7.2 02/23/2021 1026   WBC 7.0 05/22/2019 1201   RBC 5.06 02/23/2021 1026   RBC 5.29 05/22/2019 1201   HGB 15.0 02/23/2021 1026   HCT 44.5 02/23/2021 1026   PLT 198 02/23/2021 1026   MCV 88 02/23/2021 1026   MCH 29.6 02/23/2021 1026   MCH 29.3 05/22/2019 1201   MCHC 33.7 02/23/2021 1026   MCHC 32.1 05/22/2019 1201   RDW 13.0 02/23/2021 1026   LYMPHSABS 2.9 08/23/2019 1152   MONOABS 1.3 (H) 10/24/2018 0515   EOSABS 0.3 08/23/2019 1152   BASOSABS 0.1 08/23/2019 1152    ASSESSMENT AND PLAN: 1. Type 2 diabetes mellitus with other circulatory complication, with long-term current use of insulin (HCC) Not at goal with A1c quite elevated today. Discussed trying to get him approved for continuous glucose monitor so he can see how certain foods affect the blood  sugars. Increase NovoLog insulin to 28 units twice a day.  Restart Rybelsus.  I went over possible side effects of the medication including vomiting, diarrhea, abdominal pain etc. Reports having had his last eye exam summer 2023 by Dr. Katy Fitch.  - POCT glucose (manual entry) - POCT glycosylated hemoglobin (Hb A1C) - POCT URINALYSIS DIP (CLINITEK) - Comprehensive metabolic panel - CBC - insulin aspart protamine - aspart (NOVOLOG MIX 70/30 FLEXPEN) (70-30) 100 UNIT/ML FlexPen; Inject 28 Units into the skin 2 (two) times daily with a meal.  Dispense: 15 mL; Refill: 6 - Semaglutide (RYBELSUS) 3 MG TABS; Take 1 tablet (3 mg total) by mouth daily.  Dispense: 90 tablet; Refill: 2 - Continuous Blood Gluc Sensor (FREESTYLE LIBRE SENSOR SYSTEM) MISC; Change sensor Q 2 wks  Dispense: 2 each; Refill: 12 - Continuous Blood Gluc Receiver (FREESTYLE LIBRE 2 READER) DEVI; UAD  Dispense: 1 each; Refill: 0  2. Chronic diastolic HF (heart failure) (HCC) Stable. - furosemide (LASIX) 20 MG tablet; Take 1 tablet (20 mg total) by mouth daily.  Dispense:  90 tablet; Refill: 1  3. Morbid obesity ([place]) Continue to encourage him to try to eat healthy and move is much as he can.  4. 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[place]) He receives his infusions every 2 weeks through nephrology  5. Hypertension associated with type 2 diabetes mellitus ([place]) Stable and controlled on current medications - amLODipine (NORVASC) 10 MG tablet; Take 1 tablet (10 mg total) by mouth daily.  Dispense: 90 tablet; Refill: 1 - furosemide (LASIX) 20 MG tablet; Take 1 tablet (20 mg total) by mouth daily.  Dispense: 90 tablet; Refill: 1 - valsartan-hydrochlorothiazide (DIOVAN-HCT) 80-12.5 MG tablet; Take 1 tablet by mouth daily.  Dispense: 90 tablet; Refill: 1  6. Stage 3b chronic kidney disease ([place]) We will continue to monitor.   Not on SGL-2 agent  7. Atrial flutter, unspecified type ([place]) Subclinical and not of significance per last EP eval  8. History of CVA with residual deficit Continue Lipitor and Plavix - clopidogrel (PLAVIX) 75 MG tablet; Take 1 tablet (75 mg total) by mouth daily.  Dispense: 90 tablet; Refill: 1  9. Polyuria - doxazosin (CARDURA) 2 MG tablet; Take 0.5 tablets (1 mg total) by mouth daily.  Dispense: 45 tablet; Refill: 1  10. Incontinence of feces, unspecified fecal incontinence type Advised patient to keep a food diary to see whether it is caused by certain foods.  Also advised to cut back on drinks that has artificial sweeteners.  Other possibility may be due to Fabry's itself.  Advised to use Imodium PRN - Ambulatory referral to Gastroenterology  11. Influenza vaccination declined    Patient was given the opportunity to ask questions.  Patient verbalized understanding of the plan and was able to repeat key elements of the plan.   This documentation was completed using Radio producer.  Any transcriptional errors are unintentional.  Orders Placed This Encounter  Procedures   POCT glucose (manual entry)   POCT glycosylated  hemoglobin (Hb A1C)   POCT URINALYSIS DIP (CLINITEK)     Requested Prescriptions    No prescriptions requested or ordered in this encounter    No follow-ups on file.  Karle Plumber, MD, FACP

## 2022-06-25 NOTE — Patient Instructions (Signed)
Increase insulin to 28 units twice. Refill sent on Rybelsus.  I sent a prescription for a continuous glucose monitor. Try using Imodium for the diarrhea.  Keep a food diary.

## 2022-06-26 DIAGNOSIS — I4892 Unspecified atrial flutter: Secondary | ICD-10-CM | POA: Insufficient documentation

## 2022-06-26 LAB — COMPREHENSIVE METABOLIC PANEL
ALT: 29 IU/L (ref 0–44)
AST: 33 IU/L (ref 0–40)
Albumin/Globulin Ratio: 1.1 — ABNORMAL LOW (ref 1.2–2.2)
Albumin: 3.9 g/dL (ref 3.9–4.9)
Alkaline Phosphatase: 132 IU/L — ABNORMAL HIGH (ref 44–121)
BUN/Creatinine Ratio: 13 (ref 10–24)
BUN: 30 mg/dL — ABNORMAL HIGH (ref 8–27)
Bilirubin Total: 0.9 mg/dL (ref 0.0–1.2)
CO2: 22 mmol/L (ref 20–29)
Calcium: 9.6 mg/dL (ref 8.6–10.2)
Chloride: 100 mmol/L (ref 96–106)
Creatinine, Ser: 2.26 mg/dL — ABNORMAL HIGH (ref 0.76–1.27)
Globulin, Total: 3.6 g/dL (ref 1.5–4.5)
Glucose: 410 mg/dL — ABNORMAL HIGH (ref 70–99)
Potassium: 4.5 mmol/L (ref 3.5–5.2)
Sodium: 140 mmol/L (ref 134–144)
Total Protein: 7.5 g/dL (ref 6.0–8.5)
eGFR: 32 mL/min/{1.73_m2} — ABNORMAL LOW (ref >59)

## 2022-06-26 LAB — CBC
Hematocrit: 45.4 % (ref 37.5–51.0)
Hemoglobin: 15.3 g/dL (ref 13.0–17.7)
MCH: 29.4 pg (ref 26.6–33.0)
MCHC: 33.7 g/dL (ref 31.5–35.7)
MCV: 87 fL (ref 79–97)
Platelets: 178 10*3/uL (ref 150–450)
RBC: 5.21 x10E6/uL (ref 4.14–5.80)
RDW: 12.1 % (ref 11.6–15.4)
WBC: 7.3 10*3/uL (ref 3.4–10.8)

## 2022-06-28 ENCOUNTER — Other Ambulatory Visit: Payer: Self-pay

## 2022-06-29 ENCOUNTER — Telehealth: Payer: Self-pay | Admitting: Licensed Clinical Social Worker

## 2022-06-30 ENCOUNTER — Encounter: Payer: Self-pay | Admitting: Internal Medicine

## 2022-06-30 ENCOUNTER — Other Ambulatory Visit: Payer: Self-pay

## 2022-06-30 NOTE — Patient Outreach (Signed)
  Care Coordination Late Entry  Follow Up Visit Note   Outreach completed 06/28/22 Name: [name] MRN: [id number] DOB: Jul 03, 1960  [name] is a 62 y.o. year old male who sees Ladell Pier, MD for primary care. I spoke with  [name] by phone today.  What matters to the patients health and wellness today?  Supportive Resources    Goals Addressed             This Visit's Progress    Obtain Supportive Resources-Surgery Support   On track    Care Coordination Interventions: Solution-Focused Strategies employed:  Active listening / Reflection utilized  Emotional Support Provided Verbalization of feelings encouraged  Patient spoke with [name] at [place] Patient is considering various options of treatment for medical conditions Assisted in processing overwhelming emotions attributed to managing chronic health conditions Validation and encouragement provided Patient has an upcoming appt with Fabry coordinator [name] scheduled for Wednesday, Jan 24, 24 Patient endorses no vision changes for now, he can still see and drive, as needed          SDOH assessments and interventions completed:  No     Care Coordination Interventions:  Yes, provided   Follow up plan: Follow up call scheduled for 07/05/22    Encounter Outcome:  Pt. Visit Completed   [name], MSW, [email or web address] Phone [phone] 8:49 AM

## 2022-06-30 NOTE — Patient Instructions (Signed)
Visit Information  Thank you for taking time to visit with me today. Please don't hesitate to contact me if I can be of assistance to you.   Following are the goals we discussed today:   Goals Addressed             This Visit's Progress    Obtain Supportive Resources-Surgery Support   On track    Care Coordination Interventions: Solution-Focused Strategies employed:  Active listening / Reflection utilized  Emotional Support Provided Verbalization of feelings encouraged  Patient spoke with [name] at [place] Patient is considering various options of treatment for medical conditions Assisted in processing overwhelming emotions attributed to managing chronic health conditions Validation and encouragement provided Patient has an upcoming appt with Fabry coordinator [name] scheduled for Wednesday, Jan 24, 24 Patient endorses no vision changes for now, he can still see and drive, as needed          Our next appointment is by telephone on 07/05/22 at 10 AM  Please call the care guide team at [phone] if you need to cancel or reschedule your appointment.   If you are experiencing a Mental Health or [place] or need someone to talk to, please call the Suicide and Crisis Lifeline: 988 call 911   Patient verbalizes understanding of instructions and care plan provided today and agrees to view in [place]. Active MyChart status and patient understanding of how to access instructions and care plan via MyChart confirmed with patient.     [name], MSW, [email or web address] Phone [phone] 8:50 AM

## 2022-07-01 ENCOUNTER — Other Ambulatory Visit: Payer: Self-pay | Admitting: Internal Medicine

## 2022-07-01 ENCOUNTER — Telehealth: Payer: Self-pay | Admitting: Pharmacist

## 2022-07-01 ENCOUNTER — Other Ambulatory Visit: Payer: Self-pay

## 2022-07-01 ENCOUNTER — Other Ambulatory Visit: Payer: Self-pay | Admitting: Pharmacist

## 2022-07-01 DIAGNOSIS — Z794 Long term (current) use of insulin: Secondary | ICD-10-CM

## 2022-07-01 MED ORDER — OZEMPIC (0.25 OR 0.5 MG/DOSE) 2 MG/3ML ~~LOC~~ SOPN
0.2500 mg | PEN_INJECTOR | SUBCUTANEOUS | 1 refills | Status: DC
  Filled 2022-07-01 (×2): qty 3, 56d supply, fill #0

## 2022-07-01 NOTE — Telephone Encounter (Signed)
Call received from patient's pharmacy. His sensors were covered, but they are telling me his receiver needs a PA. Claiborne Billings, are able to start one for him?

## 2022-07-02 ENCOUNTER — Other Ambulatory Visit: Payer: Self-pay

## 2022-07-02 DIAGNOSIS — E7521 Fabry (-Anderson) disease: Secondary | ICD-10-CM | POA: Diagnosis not present

## 2022-07-05 ENCOUNTER — Encounter: Payer: Self-pay | Admitting: Licensed Clinical Social Worker

## 2022-07-05 ENCOUNTER — Telehealth: Payer: Self-pay | Admitting: Licensed Clinical Social Worker

## 2022-07-05 DIAGNOSIS — H40023 Open angle with borderline findings, high risk, bilateral: Secondary | ICD-10-CM | POA: Diagnosis not present

## 2022-07-05 DIAGNOSIS — E119 Type 2 diabetes mellitus without complications: Secondary | ICD-10-CM | POA: Diagnosis not present

## 2022-07-05 DIAGNOSIS — H02423 Myogenic ptosis of bilateral eyelids: Secondary | ICD-10-CM | POA: Diagnosis not present

## 2022-07-05 DIAGNOSIS — H1045 Other chronic allergic conjunctivitis: Secondary | ICD-10-CM | POA: Diagnosis not present

## 2022-07-05 DIAGNOSIS — H2513 Age-related nuclear cataract, bilateral: Secondary | ICD-10-CM | POA: Diagnosis not present

## 2022-07-05 DIAGNOSIS — H02834 Dermatochalasis of left upper eyelid: Secondary | ICD-10-CM | POA: Diagnosis not present

## 2022-07-05 DIAGNOSIS — E7521 Fabry (-Anderson) disease: Secondary | ICD-10-CM | POA: Diagnosis not present

## 2022-07-05 DIAGNOSIS — H02831 Dermatochalasis of right upper eyelid: Secondary | ICD-10-CM | POA: Diagnosis not present

## 2022-07-05 DIAGNOSIS — Z794 Long term (current) use of insulin: Secondary | ICD-10-CM | POA: Diagnosis not present

## 2022-07-05 DIAGNOSIS — H35033 Hypertensive retinopathy, bilateral: Secondary | ICD-10-CM | POA: Diagnosis not present

## 2022-07-05 DIAGNOSIS — I639 Cerebral infarction, unspecified: Secondary | ICD-10-CM | POA: Diagnosis not present

## 2022-07-05 LAB — HM DIABETES EYE EXAM

## 2022-07-05 NOTE — Patient Outreach (Signed)
  Care Coordination   07/05/2022 Name: [name] MRN: [id number] DOB: January 13, 1961   Care Coordination Outreach Attempts:  An unsuccessful telephone outreach was attempted for a scheduled appointment today.  Follow Up Plan:  Additional outreach attempts will be made to offer the patient care coordination information and services.   Encounter Outcome:  No Answer   Care Coordination Interventions:  No, not indicated    [name], MSW, [name] Phone [phone] 3:18 PM

## 2022-07-06 ENCOUNTER — Other Ambulatory Visit: Payer: Self-pay

## 2022-07-06 ENCOUNTER — Encounter: Payer: Self-pay | Admitting: Internal Medicine

## 2022-07-08 ENCOUNTER — Other Ambulatory Visit: Payer: Self-pay

## 2022-07-09 ENCOUNTER — Telehealth: Payer: Self-pay | Admitting: Emergency Medicine

## 2022-07-09 NOTE — Telephone Encounter (Signed)
Copied from [place] [id number]. Topic: General - Other >> Jul 09, 2022 10:17 AM Cyndi Bender wrote: Reason for CRM: Pt reports that he has been trying to contact [name] but the number for her is not working. Pt requests call back. Cb# [phone]

## 2022-07-12 ENCOUNTER — Telehealth: Payer: Self-pay | Admitting: Emergency Medicine

## 2022-07-12 NOTE — Telephone Encounter (Signed)
Copied from [place] [id number]. Topic: General - Other >> Jul 12, 2022  9:24 AM Cyndi Bender wrote: Reason for CRM: Pt requests that Lurena Joiner return his call asap at [phone]

## 2022-07-12 NOTE — Telephone Encounter (Signed)
Returned call to [name]. Verified I was speaking to him using two identifiers. I introduced myself and asked how I could help him.   He asks how to know what dose is injected with the Ozempic pen. He reports only seeing hash marks on the dose dial/indicator. He needs to turn the dial on the pen until he sees 0.25 and that's how he'll know he's injecting 0.25 mg. He verbalizes understanding and has no further questions at this time.   Benard Halsted, PharmD, [place], [place] [phone]

## 2022-07-13 ENCOUNTER — Other Ambulatory Visit: Payer: Self-pay | Admitting: Internal Medicine

## 2022-07-13 ENCOUNTER — Encounter: Payer: Self-pay | Admitting: Internal Medicine

## 2022-07-14 ENCOUNTER — Other Ambulatory Visit: Payer: Self-pay

## 2022-07-14 ENCOUNTER — Other Ambulatory Visit: Payer: Self-pay | Admitting: Pharmacist

## 2022-07-14 MED ORDER — ONETOUCH VERIO VI STRP: ORAL_STRIP | 2 refills | Status: DC

## 2022-07-16 ENCOUNTER — Other Ambulatory Visit: Payer: Self-pay

## 2022-07-16 DIAGNOSIS — E7521 Fabry (-Anderson) disease: Secondary | ICD-10-CM | POA: Diagnosis not present

## 2022-07-20 ENCOUNTER — Encounter: Payer: Self-pay | Admitting: Internal Medicine

## 2022-07-20 ENCOUNTER — Other Ambulatory Visit: Payer: Self-pay

## 2022-07-23 ENCOUNTER — Other Ambulatory Visit: Payer: Self-pay

## 2022-07-23 ENCOUNTER — Other Ambulatory Visit: Payer: Self-pay | Admitting: Pharmacist

## 2022-07-23 MED ORDER — SEMAGLUTIDE (1 MG/DOSE) 4 MG/3ML ~~LOC~~ SOPN
1.0000 mg | PEN_INJECTOR | SUBCUTANEOUS | 3 refills | Status: DC
  Filled 2022-07-23: qty 3, 28d supply, fill #0
  Filled 2022-09-06: qty 9, 84d supply, fill #0

## 2022-07-23 MED ORDER — OZEMPIC (0.25 OR 0.5 MG/DOSE) 2 MG/3ML ~~LOC~~ SOPN
PEN_INJECTOR | SUBCUTANEOUS | 0 refills | Status: DC
  Filled 2022-07-23: qty 3, fill #0
  Filled 2022-09-06: qty 3, 28d supply, fill #0

## 2022-07-27 ENCOUNTER — Telehealth: Payer: Self-pay | Admitting: Licensed Clinical Social Worker

## 2022-07-28 ENCOUNTER — Other Ambulatory Visit: Payer: Self-pay

## 2022-07-29 NOTE — Patient Instructions (Signed)
Visit Information  Thank you for taking time to visit with me today. Please don't hesitate to contact me if I can be of assistance to you.   Following are the goals we discussed today:   Goals Addressed             This Visit's Progress    Obtain Supportive Resources-Surgery Support   On track    Care Coordination Interventions: Solution-Focused Strategies employed:  Active listening / Reflection utilized  Emotional Support Provided Verbalization of feelings encouraged  Patient spoke with [name] at [place] Patient is considering various options of treatment for medical conditions Assisted in processing overwhelming emotions attributed to managing chronic health conditions Validation and encouragement provided Patient has an upcoming appt with Fabry coordinator [name] scheduled for Wednesday, Jan 24, 24 Patient endorses no vision changes for now, he can still see and drive, as needed          Our next appointment is by telephone on 08/10/22 at 10 AM  Please call the care guide team at [phone] if you need to cancel or reschedule your appointment.   If you are experiencing a Mental Health or [place] or need someone to talk to, please call the Suicide and Crisis Lifeline: 988 call 911   Patient verbalizes understanding of instructions and care plan provided today and agrees to view in [place]. Active MyChart status and patient understanding of how to access instructions and care plan via MyChart confirmed with patient.     [name], MSW, [name] Phone [phone] 12:12 PM

## 2022-07-29 NOTE — Patient Outreach (Signed)
  Care Coordination Late Entry  Follow Up Visit Note   Encounter completed on 07/27/22 Name: [name] MRN: [id number] DOB: [birth year]  [name] is a 62 y.o. year old male who sees [name] Pier, MD for primary care. I spoke with  [name] by phone today.  What matters to the patients health and wellness today?  Health Management    Goals Addressed             This Visit's Progress    Obtain Supportive Resources-Surgery Support   On track    Care Coordination Interventions: Solution-Focused Strategies employed:  Active listening / Reflection utilized  Emotional Support Provided Verbalization of feelings encouraged  Patient spoke with [name] at [place] Patient is considering various options of treatment for medical conditions Assisted in processing overwhelming emotions attributed to managing chronic health conditions Validation and encouragement provided Patient has an upcoming appt with [name] scheduled for Wednesday, Jan 24, 24 Patient endorses no vision changes for now, he can still [name] and drive, as needed          SDOH assessments and interventions completed:  No     Care Coordination Interventions:  Yes, provided   Follow up plan: Follow up call scheduled for 2 weeks    Encounter Outcome:  Pt. Visit Completed   [name], MSW, [email or web address] Phone [phone] 12:10 PM

## 2022-07-30 ENCOUNTER — Telehealth: Payer: Self-pay | Admitting: Internal Medicine

## 2022-07-30 DIAGNOSIS — E7521 Fabry (-Anderson) disease: Secondary | ICD-10-CM | POA: Diagnosis not present

## 2022-07-30 NOTE — Telephone Encounter (Signed)
Patient is calling in because he received a call from [name] and he was returning her call.

## 2022-07-30 NOTE — Telephone Encounter (Signed)
Pt returned phone call.  

## 2022-07-31 ENCOUNTER — Other Ambulatory Visit: Payer: Self-pay | Admitting: Internal Medicine

## 2022-08-02 ENCOUNTER — Telehealth: Payer: Self-pay | Admitting: Licensed Clinical Social Worker

## 2022-08-02 DIAGNOSIS — I441 Atrioventricular block, second degree: Secondary | ICD-10-CM | POA: Diagnosis not present

## 2022-08-02 DIAGNOSIS — E118 Type 2 diabetes mellitus with unspecified complications: Secondary | ICD-10-CM | POA: Diagnosis not present

## 2022-08-02 DIAGNOSIS — I890 Lymphedema, not elsewhere classified: Secondary | ICD-10-CM | POA: Diagnosis not present

## 2022-08-02 DIAGNOSIS — E349 Endocrine disorder, unspecified: Secondary | ICD-10-CM | POA: Diagnosis not present

## 2022-08-02 DIAGNOSIS — E7521 Fabry (-Anderson) disease: Secondary | ICD-10-CM | POA: Diagnosis not present

## 2022-08-02 DIAGNOSIS — N1831 Chronic kidney disease, stage 3a: Secondary | ICD-10-CM | POA: Diagnosis not present

## 2022-08-02 DIAGNOSIS — M109 Gout, unspecified: Secondary | ICD-10-CM | POA: Diagnosis not present

## 2022-08-02 DIAGNOSIS — I639 Cerebral infarction, unspecified: Secondary | ICD-10-CM | POA: Diagnosis not present

## 2022-08-02 DIAGNOSIS — I129 Hypertensive chronic kidney disease with stage 1 through stage 4 chronic kidney disease, or unspecified chronic kidney disease: Secondary | ICD-10-CM | POA: Diagnosis not present

## 2022-08-02 DIAGNOSIS — N521 Erectile dysfunction due to diseases classified elsewhere: Secondary | ICD-10-CM | POA: Diagnosis not present

## 2022-08-02 NOTE — Telephone Encounter (Signed)
Requested medication (s) are due for refill today: yes  Requested medication (s) are on the active medication list: yes  Last refill:  06/10/21 #270/1  Future visit scheduled: yes  Notes to clinic:  Unable to refill per protocol due to failed labs, no updated results.    Requested Prescriptions  Pending Prescriptions Disp Refills   allopurinol (ZYLOPRIM) 100 MG tablet [Pharmacy Med Name: ALLOPURINOL 100 MG TABLET] 270 tablet 1    Sig: TAKE 3 TABLETS BY [place] DAY     Endocrinology:  Gout Agents - allopurinol Failed - 07/31/2022 11:04 AM      Failed - Uric Acid in normal range and within 360 days    Uric Acid  Date Value Ref Range Status  10/06/2016 8.1 3.7 - 8.6 mg/dL Final    Comment:               Therapeutic target for gout patients: <6.0         Failed - Cr in normal range and within 360 days    Creat  Date Value Ref Range Status  11/07/2014 1.28 0.50 - 1.35 mg/dL Final   Creatinine, Ser  Date Value Ref Range Status  06/25/2022 2.26 (H) 0.76 - 1.27 mg/dL Final   Creatinine, Urine  Date Value Ref Range Status  08/30/2014 72.9 mg/dL Final    Comment:    No reference range established.         Failed - CBC within normal limits and completed in the last 12 months    WBC  Date Value Ref Range Status  06/25/2022 7.3 3.4 - 10.8 x10E3/uL Final  05/22/2019 7.0 4.0 - 10.5 K/uL Final   RBC  Date Value Ref Range Status  06/25/2022 5.21 4.14 - 5.80 x10E6/uL Final  05/22/2019 5.29 4.22 - 5.81 MIL/uL Final   Hemoglobin  Date Value Ref Range Status  06/25/2022 15.3 13.0 - 17.7 g/dL Final   Total hemoglobin  Date Value Ref Range Status  07/26/2013 14.2 13.5 - 18.0 g/dL Final   Hematocrit  Date Value Ref Range Status  06/25/2022 45.4 37.5 - 51.0 % Final   MCHC  Date Value Ref Range Status  06/25/2022 33.7 31.5 - 35.7 g/dL Final  05/22/2019 32.1 30.0 - 36.0 g/dL Final   [facility]  Date Value Ref Range Status  06/25/2022 29.4 26.6 - 33.0 pg Final  05/22/2019 29.3  26.0 - 34.0 pg Final   MCV  Date Value Ref Range Status  06/25/2022 87 79 - 97 fL Final   No results found for: "PLTCOUNTKUC", "LABPLAT", "POCPLA" RDW  Date Value Ref Range Status  06/25/2022 12.1 11.6 - 15.4 % Final         Passed - Valid encounter within last 12 months    Recent Outpatient Visits           1 month ago Type 2 diabetes mellitus with other circulatory complication, with long-term current use of insulin ([place])   [place] Karle Plumber B, MD   9 months ago Type 2 diabetes mellitus with peripheral neuropathy [facility])   [place] Karle Plumber B, MD   1 year ago Type 2 diabetes mellitus with other circulatory complication, with long-term current use of insulin [facility])   [place] Ladell Pier, MD   1 year ago Need for vaccination against Streptococcus pneumoniae   [place]  Delena Serve, RPH-CPP   1 year ago Type 2 diabetes mellitus with other circulatory complication, with long-term current use of insulin [facility])   [place], MD       Future Appointments             In 3 weeks Wynetta Emery Dalbert Batman, MD [place]

## 2022-08-03 ENCOUNTER — Encounter: Payer: Self-pay | Admitting: Internal Medicine

## 2022-08-03 ENCOUNTER — Telehealth: Payer: Self-pay | Admitting: Licensed Clinical Social Worker

## 2022-08-03 NOTE — Patient Instructions (Signed)
Visit Information  Thank you for taking time to visit with me today. Please don't hesitate to contact me if I can be of assistance to you.   Following are the goals we discussed today:   Goals Addressed             This Visit's Progress    Obtain Supportive Resources-Surgery Support   On track    Activities and task to complete in order to accomplish goals.   Call chiropractor offices to follow up on how to establish services Continue with compliance of taking medication prescribed by Doctor                    Our next appointment is by telephone on 08/10/22 at 10 AM  Please call the care guide team at [phone] if you need to cancel or reschedule your appointment.   If you are experiencing a Mental Health or [place] or need someone to talk to, please call the Suicide and Crisis Lifeline: 988 call 911   Patient verbalizes understanding of instructions and care plan provided today and agrees to view in [place]. Active MyChart status and patient understanding of how to access instructions and care plan via MyChart confirmed with patient.     [name], MSW, [email or web address] Phone [phone] 3:46 PM

## 2022-08-03 NOTE — Patient Outreach (Signed)
  Care Coordination   Follow Up Visit Note   08/03/2022 Name: JERID ZACHRY MRN: [id number] DOB: 1960-09-12  AUNDREA DEMMONS is a 62 y.o. year old male who sees Ladell Pier, MD for primary care. I spoke with  [name] by phone today.  What matters to the patients health and wellness today?    Conducted brief assessment, recommendations and relevant information discussed.      Goals Addressed             This Visit's Progress    Obtain Supportive Resources-Surgery Support   On track    Activities and task to complete in order to accomplish goals.   Call chiropractor offices to follow up on how to establish services Continue with compliance of taking medication prescribed by Doctor                    SDOH assessments and interventions completed:  Yes  SDOH Interventions Today    Flowsheet Row Most Recent Value  SDOH Interventions   Food Insecurity Interventions Intervention Not Indicated  Housing Interventions Intervention Not Indicated  Transportation Interventions Intervention Not Indicated  Utilities Interventions Intervention Not Indicated        Care Coordination Interventions:  Yes, provided  Interventions Today    Flowsheet Row Most Recent Value  Chronic Disease   Chronic disease during today's visit Hypertension (HTN), Diabetes, Chronic Kidney Disease/End Stage Renal Disease (ESRD)  General Interventions   General Interventions Discussed/Reviewed General Interventions Reviewed  Education Interventions   Education Provided Provided Education  Provided Verbal Education On [employer] of local chiropractors]       Follow up plan: Follow up call scheduled for 08/10/22    Encounter Outcome:  Pt. Visit Completed   [name], MSW, [email or web address] Phone [phone] 3:45 PM

## 2022-08-03 NOTE — Patient Outreach (Addendum)
  Care Coordination   Collaborative  Visit Note   08/03/2022 Name: [name] MRN: [id number] DOB: March 15, 1961  [name] is a 62 y.o. year old male who sees Ladell Pier, MD for primary care. I  spoke with [name], [facility] Referral Coordinator  What matters to the patients health and wellness today?  Patient was not engaged during this encounter    Goals Addressed             This Visit's Progress    Obtain Supportive Resources-Surgery Support   On track    Activities and task to complete in order to accomplish goals.   LCSW collaborated with [facility] Referral Coordinator to assist with pt goals                     SDOH assessments and interventions completed:  No     Care Coordination Interventions:  Yes, provided  Interventions Today    Flowsheet Row Most Recent Value  Chronic Disease   Chronic disease during today's visit Diabetes, Hypertension (HTN), Chronic Kidney Disease/End Stage Renal Disease (ESRD)  General Interventions   General Interventions Discussed/Reviewed Communication with  Communication with --  [name], [facility] Referral Coordinator]       Follow up plan: Follow up call scheduled for 08/10/22    Encounter Outcome:  Pt. Visit Completed   [name], MSW, [email or web address] Phone [phone] 4:20 PM

## 2022-08-03 NOTE — Patient Instructions (Signed)
Visit Information  Thank you for taking time to visit with me today. Please don't hesitate to contact me if I can be of assistance to you.   Following are the goals we discussed today:   Goals Addressed             This Visit's Progress    Obtain Supportive Resources-Surgery Support   On track    Activities and task to complete in order to accomplish goals.   Call chiropractor offices to follow up on how to establish services Continue with compliance of taking medication prescribed by Doctor                    Our next appointment is by telephone on 08/10/22 at 10 AM  Please call the care guide team at [phone] if you need to cancel or reschedule your appointment.   If you are experiencing a Mental Health or [place] or need someone to talk to, please call the Suicide and Crisis Lifeline: 988 call 911   Patient verbalizes understanding of instructions and care plan provided today and agrees to view in [place]. Active MyChart status and patient understanding of how to access instructions and care plan via MyChart confirmed with patient.     [name], MSW, [email or web address] Phone [phone] 4:01 PM

## 2022-08-04 ENCOUNTER — Other Ambulatory Visit: Payer: Self-pay | Admitting: Internal Medicine

## 2022-08-04 DIAGNOSIS — G463 Brain stem stroke syndrome: Secondary | ICD-10-CM

## 2022-08-04 MED ORDER — ATORVASTATIN CALCIUM 80 MG PO TABS: ORAL_TABLET | ORAL | 2 refills | Status: DC

## 2022-08-05 ENCOUNTER — Other Ambulatory Visit: Payer: Self-pay

## 2022-08-06 ENCOUNTER — Other Ambulatory Visit: Payer: Self-pay

## 2022-08-10 ENCOUNTER — Ambulatory Visit: Payer: Self-pay | Admitting: Licensed Clinical Social Worker

## 2022-08-11 ENCOUNTER — Encounter: Payer: Self-pay | Admitting: Internal Medicine

## 2022-08-11 NOTE — Progress Notes (Signed)
I received office note from nephrologist Dr. Donato Heinz.  Patient was seen 08/02/2022. 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  Patient on Farbrazyme infusion every 2 weeks and tolerating well. HTN -controlled on current medications DM type II -A1c 10.4 ED -patient was started on sildenafil 50 mg as needed.  Labs: Intact PT H168 Hemoglobin A1c 10.4 Protein/creatinine ratio 550 H/H 14.8/44.3 GFR 38/creatinine 1.98

## 2022-08-11 NOTE — Progress Notes (Signed)
Remote pacemaker transmission.   

## 2022-08-11 NOTE — Patient Outreach (Signed)
  Care Coordination   Follow Up Visit Note   08/10/22 Name: [name] MRN: [id number] DOB: [birth year]  [name] is a 62 y.o. year old male who sees [name] Pier, MD for primary care. I spoke with  [name] by phone today.  What matters to the patients health and wellness today?   Pt continues to maintain positive progress with goals.    Goals Addressed             This Visit's Progress    Obtain Supportive Resources-Surgery Support   On track    Activities and task to complete in order to accomplish goals.   Keep all upcoming appointment discussed today Continue with compliance of taking medication prescribed by Doctor  Pt agreed to f/up with resources provided by LCSW prior to PCP appt. Agreed to discuss with PCP, if referral is requested Pt agreed to f/up with specialist regarding lab results through [facility]                   SDOH assessments and interventions completed:  No     Care Coordination Interventions:  Yes, provided   Follow up plan: Follow up call scheduled for 2-4 weeks    Encounter Outcome:  Pt. Visit Completed   [name], MSW, [name] Phone [phone] 7:37 AM

## 2022-08-11 NOTE — Patient Instructions (Signed)
Visit Information  Thank you for taking time to visit with me today. Please don't hesitate to contact me if I can be of assistance to you.   Following are the goals we discussed today:   Goals Addressed             This Visit's Progress    Obtain Supportive Resources-Surgery Support   On track    Activities and task to complete in order to accomplish goals.   Keep all upcoming appointment discussed today Continue with compliance of taking medication prescribed by Doctor  Pt agreed to f/up with resources provided by LCSW prior to PCP appt. Agreed to discuss with PCP, if referral is requested Pt agreed to f/up with specialist regarding lab results through [place] next appointment is by telephone on 04/02 at 10 AM  Please call the care guide team at [phone] if you need to cancel or reschedule your appointment.   If you are experiencing a Mental Health or [place] or need someone to talk to, please call the Suicide and Crisis Lifeline: 988 call 911   Patient verbalizes understanding of instructions and care plan provided today and agrees to view in [place]. Active MyChart status and patient understanding of how to access instructions and care plan via MyChart confirmed with patient.     [name], MSW, [email or web address] Phone [phone] 7:39 AM

## 2022-08-13 ENCOUNTER — Telehealth: Payer: Self-pay | Admitting: Emergency Medicine

## 2022-08-13 DIAGNOSIS — E7521 Fabry (-Anderson) disease: Secondary | ICD-10-CM | POA: Diagnosis not present

## 2022-08-13 NOTE — Telephone Encounter (Signed)
Copied from [place] [phone]. Topic: General - Other >> Aug 13, 2022 12:29 PM [name] wrote: Reason for CRM: The patient has returned a missed call from staff regarding their previously prescribed ozempic  Please contact the patient further when possible

## 2022-08-16 ENCOUNTER — Telehealth: Payer: Self-pay

## 2022-08-16 ENCOUNTER — Other Ambulatory Visit: Payer: Self-pay

## 2022-08-16 NOTE — Telephone Encounter (Signed)
Patient returned my call today and states he is tolerating Ozempic ok-had one episode where he vomited but did continue taking medication with no further issues. Patient is aware that he has been approved for patient assistance and has my direct number to request refills. States that he is dosing currently at 0.'5mg'$  and aware of titration schedule.

## 2022-08-17 ENCOUNTER — Telehealth: Payer: Self-pay | Admitting: Neurology

## 2022-08-17 NOTE — Telephone Encounter (Signed)
Called & spoke to the patient. Verified name & DOB. Patient stated that he received a call back from [place] from the pharmacy and was able to clarify what was needed. Informed of patient of message sent on 08/11/2022. Patient stated that so far he has only had one episode of vomiting after taking Ozempic. Confirmed upcoming appointment on 08/24/2022.

## 2022-08-17 NOTE — Telephone Encounter (Signed)
I received office visit 08/02/2022 from [facility].  He was started on Ozempic due to elevated A1c 11.8.  Continues to receive Fabrazyme infusions every 2 weeks.  Not able to have Port-A-Cath due to not having personal care for 24 hours following the procedure.  Discussed oral formulation as he is getting tired of infusions.  Will need genetic testing to see if he qualifies.  A1c 10.4, creatinine 1.98, EGFR 38, sodium 147, AST 27, ALT 27

## 2022-08-24 ENCOUNTER — Encounter: Payer: Self-pay | Admitting: Internal Medicine

## 2022-08-24 ENCOUNTER — Telehealth: Payer: Self-pay | Admitting: Licensed Clinical Social Worker

## 2022-08-24 ENCOUNTER — Ambulatory Visit: Payer: Medicare HMO | Attending: Internal Medicine | Admitting: Internal Medicine

## 2022-08-24 VITALS — BP 117/73 | HR 73 | Temp 97.9°F | Ht 70.0 in | Wt 277.0 lb

## 2022-08-24 DIAGNOSIS — E7521 Fabry (-Anderson) disease: Secondary | ICD-10-CM | POA: Diagnosis not present

## 2022-08-24 DIAGNOSIS — N1832 Chronic kidney disease, stage 3b: Secondary | ICD-10-CM | POA: Diagnosis not present

## 2022-08-24 DIAGNOSIS — K5903 Drug induced constipation: Secondary | ICD-10-CM | POA: Diagnosis not present

## 2022-08-24 DIAGNOSIS — I152 Hypertension secondary to endocrine disorders: Secondary | ICD-10-CM

## 2022-08-24 DIAGNOSIS — Z2821 Immunization not carried out because of patient refusal: Secondary | ICD-10-CM | POA: Insufficient documentation

## 2022-08-24 DIAGNOSIS — E1159 Type 2 diabetes mellitus with other circulatory complications: Secondary | ICD-10-CM | POA: Diagnosis not present

## 2022-08-24 DIAGNOSIS — Z794 Long term (current) use of insulin: Secondary | ICD-10-CM | POA: Diagnosis not present

## 2022-08-24 DIAGNOSIS — R159 Full incontinence of feces: Secondary | ICD-10-CM

## 2022-08-24 MED ORDER — FREESTYLE LIBRE 2 READER DEVI: 0 refills | Status: DC

## 2022-08-24 NOTE — Telephone Encounter (Signed)
error 

## 2022-08-24 NOTE — Patient Instructions (Addendum)
Hold off on taking Imodium for the next 2 weeks to see if the constipation resolves.  Okay to drink a little bit of prune juice a few times a week.  However please monitor your blood sugars to make sure that the prune juice does not increase the blood sugars too much.  Speak with Dr. Marval Regal about the genetic testing that he has ordered on you.  I have resent prescription for the [facility] 2 reader device to your pharmacy.  After being on the Ozempic 0.5 mg for 1 month, you can increase it to 1 mg provided you continue to tolerate the current dose until then.

## 2022-08-24 NOTE — Patient Outreach (Signed)
  Care Coordination   Follow Up Visit Note   08/24/2022 Name: [name] MRN: [id number] DOB: 19-Dec-1960  [name] is a 62 y.o. year old male who sees Ladell Pier, MD for primary care. I spoke with  [name] by phone today.  What matters to the patients health and wellness today?  Care Coordination    Goals Addressed             This Visit's Progress    Obtain Supportive Resources-Surgery Support   On track    Activities and task to complete in order to accomplish goals.   Keep all upcoming appointment discussed today Continue with compliance of taking medication prescribed by Doctor  Continue utilizing support system to assist with stress management                   SDOH assessments and interventions completed:  No     Care Coordination Interventions:  Yes, provided  Interventions Today    Flowsheet Row Most Recent Value  Chronic Disease   Chronic disease during today's visit Hypertension (HTN), Diabetes, Chronic Kidney Disease/End Stage Renal Disease (ESRD)  [Fabry Disease]  General Interventions   General Interventions Discussed/Reviewed General Interventions Reviewed, Doctor Visits  Doctor Visits Discussed/Reviewed Doctor Visits Reviewed  [Patient provided [facility] lab results. Will follow up with specialist regarding tx [place]  [Pt is doing well managing stressors]       Follow up plan: Follow up call scheduled for 4-6 weeks    Encounter Outcome:  Pt. Visit Completed   [name], MSW, [email or web address] Phone [phone] 5:05 PM

## 2022-08-24 NOTE — Patient Instructions (Signed)
Visit Information  Thank you for taking time to visit with me today. Please don't hesitate to contact me if I can be of assistance to you.   Following are the goals we discussed today:   Goals Addressed             This Visit's Progress    Obtain Supportive Resources-Surgery Support   On track    Activities and task to complete in order to accomplish goals.   Keep all upcoming appointment discussed today Continue with compliance of taking medication prescribed by Doctor  Continue utilizing support system to assist with stress management                   Our next appointment is by telephone on 4/2 at 10 AM  Please call the care guide team at [phone] if you need to cancel or reschedule your appointment.   If you are experiencing a Mental Health or [place] or need someone to talk to, please call the Suicide and Crisis Lifeline: 988 call 911   Patient verbalizes understanding of instructions and care plan provided today and agrees to view in [place]. Active MyChart status and patient understanding of how to access instructions and care plan via MyChart confirmed with patient.     [name], MSW, [email or web address] Phone [phone] 5:06 PM

## 2022-08-24 NOTE — Progress Notes (Signed)
[name], male    DOB: [birth year]  MRN: [id number]  CC: Diabetes (DM f/u. [place]ion [place] ozempic - constipation. /No to flu vax. )   Subjective: [name] is a 62 y.o. male who presents for chronic disease management His concerns today include:  Patient with history of diabetes type 2 with microalbuminuria, diastolic CHF with pacemaker (secconary to AV block with syncopy), CKD stage III, HOCM, HTN, HL, CVA, lymphedema. 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    CKD 3b: Patient was seen by his nephrologist Dr. Marval Regal 08/02/2022. Continues to receive Fabrazyme infusion every 2 weeks and tolerating well.  Blood pressure on that visit was controlled.  A1c was checked and was 10.4. Patient was started on sildenafil 50 mg as needed for ED. Labs revealed intact PTH of 168, protein/creatinine ratio 550, H/H of 14.8/43, GFR of 38, creatinine 1.98. Genetic testing ordered by Dr. Marval Regal to determine whether he can be changed to an oral form of Fabrazyme.  Patient brings the results of that testing with him today for my review.  HTN/diastolic CHF:  BP good today.  Reports compliance with amlodipine 10 mg daily, doxazosin 2 mg half a tablet daily, furosemide 20 mg daily, Diovan/HCTZ 80/12.5 mg daily   DM: 10.4 on 08/02/22 by nephrologist Restarted on Rybelsus on last visit.  Apparently it was not covered by his insurance so we changed to [place]. On it for about 6 wks now. Has been on 0.5 mg dose x 2 wks.  Had 1 vomiting episode 3-4 days after first dose of 0.25 mg.  Nothing since then. No nausea, diarrhea, stomach. Also noticed stools have been hard which being on Ozempic. Purchased some prune juice but has not used it as it.   C/o fecal incontinence on last visit.  I recommended Imodium OTC PRN; taking about 2x/wk.  Reports good results with taking the Imodium.  Referred to GI.  No call for appt as yet NovoLog 70/30 was increased from 24 units  twice a day to 28 units twice a day on last visit.  Patient has decreased it back due to reported low blood sugar episodes. Received CGM Freestyle Libre Sensor but not reader Checking BS twice a day before BF and evening.  Gives range before BF 100-115, evening before dinner 120-128  HL/CVA: Reports compliance with atorvastatin and Plavix   HM:  pt declines Tdapt  Patient Active Problem List   Diagnosis Date Noted   Atrial flutter, unspecified type ([place]) 06/26/2022   Post-nasal drip 10/26/2021   Chronic sinusitis 07/01/2021   Contact dermatitis and other eczema 07/01/2021   Edema 07/01/2021   Other and unspecified hyperlipidemia 07/01/2021   Proteinuria 07/01/2021   Macroalbuminuric diabetic nephropathy ([place]) 11/08/2020   Diabetes mellitus ([place]) 11/07/2020   Hypertension associated with stage 3a chronic kidney disease due to type 2 diabetes mellitus ([place]) 11/07/2020   Influenza vaccine needed 07/08/2020   Spinal stenosis of lumbar region without neurogenic claudication 07/08/2020   Degenerative spondylolisthesis 05/20/2020   Acute left-sided low back pain with left-sided sciatica 05/08/2020   Reactive airway disease 01/24/2020   Pain due to onychomycosis of toenails of both feet 10/17/2019   Morbid obesity ([place]) 08/23/2019   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   CVA (cerebral vascular accident) ([place]) 10/20/2018   UTI (urinary tract infection) 10/19/2018   Upper airway cough syndrome 08/01/2018   Lymphedema 10/06/2016   Gout, arthritis    Anorectal fistula 06/23/2015  Onychomycosis of toenail 09/26/2014   Diabetes mellitus type 2 in obese [facility])    Iron deficiency anemia 07/29/2014   CKD (chronic kidney disease), stage III (HCC)    Chronic diastolic HF (heart failure) ([place]) 03/27/2013   Peripheral edema 07/20/2012   Obstructive sleep apnea, suspected 06/21/2012   Pacemaker-St.Jude 02/23/2012   Hypertension 08/09/2011   Mobitz type II atrioventricular block 05/19/2011    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[place]) 05/19/2011     Current Outpatient Medications on File Prior to Visit  Medication Sig Dispense Refill   Accu-Chek FastClix Lancets MISC 1 each by Other route 3 (three) times daily. 102 each 12   acetaminophen (TYLENOL) 325 MG tablet Take 1-2 tablets (325-650 mg total) by mouth every 4 (four) hours as needed for mild pain.     Agalsidase beta (FABRAZYME IV) Inject 115 mg into the vein every 14 (fourteen) days.      albuterol (PROAIR HFA) 108 (90 Base) MCG/ACT inhaler Inhale 1 puff into the lungs every 6 (six) hours as needed for wheezing or shortness of breath. 8.5 each 1   allopurinol (ZYLOPRIM) 100 MG tablet TAKE 3 TABLETS BY MOUTH EVERY DAY 270 tablet 1   amLODipine (NORVASC) 10 MG tablet Take 1 tablet (10 mg total) by mouth daily. 90 tablet 1   atorvastatin (LIPITOR) 80 MG tablet TAKE 1 TABLET BY MOUTH DAILY AT 6 PM. 90 tablet 2   clopidogrel (PLAVIX) 75 MG tablet Take 1 tablet (75 mg total) by mouth daily. 90 tablet 1   Continuous Blood Gluc Sensor (FREESTYLE LIBRE SENSOR SYSTEM) MISC Change sensor Q 2 wks 2 each 12   doxazosin (CARDURA) 2 MG tablet Take 0.5 tablets (1 mg total) by mouth daily. 45 tablet 1   fexofenadine (ALLEGRA) 180 MG tablet Take 180 mg by mouth every 14 (fourteen) days.     fluticasone (FLONASE) 50 MCG/ACT nasal spray PLACE 1 SPRAY INTO BOTH NOSTRILS DAILY AS NEEDED FOR ALLERGIES. 48 mL 1   furosemide (LASIX) 20 MG tablet Take 1 tablet (20 mg total) by mouth daily. 90 tablet 1   glucose blood (ONETOUCH VERIO) test strip Use to check blood sugar three times daily. 100 each 2   insulin aspart protamine - aspart (NOVOLOG MIX 70/30 FLEXPEN) (70-30) 100 UNIT/ML FlexPen Inject 28 Units into the skin 2 (two) times daily with a meal. 15 mL 6   Insulin Pen Needle (PX SHORTLENGTH PEN NEEDLES) 31G X 8 MM MISC 1 each by Does not apply route 2 (two) times daily before a meal. 100 each 12   mupirocin ointment (BACTROBAN) 2 % Apply to affected area daily with  dressing change. 30 g 0   Semaglutide, 1 MG/DOSE, 4 MG/3ML SOPN Inject 1 mg as directed once a week. Begin once you complete the 0.25mg  and 0.5mg  doses. Continue the 1mg  weekly dose thereafter. 3 mL 3   Semaglutide,0.25 or 0.5MG /DOS, (OZEMPIC, 0.25 OR 0.5 MG/DOSE,) 2 MG/3ML SOPN Inject 0.25 mg into the skin every 7 (seven) days. 3 mL 1   Semaglutide,0.25 or 0.5MG /DOS, (OZEMPIC, 0.25 OR 0.5 MG/DOSE,) 2 MG/3ML SOPN Inject 0.5 mg into the skin once weekly for 4 weeks. Once you complete 4 injections, start the next dose (1mg  pen). 3 mL 0   valsartan-hydrochlorothiazide (DIOVAN-HCT) 80-12.5 MG tablet Take 1 tablet by mouth daily. 90 tablet 1   Continuous Blood Gluc Receiver (FREESTYLE LIBRE 2 READER) DEVI UAD (Patient not taking: Reported on 08/24/2022) 1 each 0   Prednisol Ace-Moxiflox-Bromfen 1-0.5-0.075 % SUSP Place 1  drop into the right eye 4 (four) times daily. Start 1 day before the surgery on 10/19/21 (Patient not taking: Reported on 08/24/2022)     No current facility-administered medications on file prior to visit.    Allergies  Allergen Reactions   Lisinopril Anaphylaxis   Shellfish Allergy Hives and Swelling    Social History   Socioeconomic History   Marital status: Divorced    Spouse name: Not on file   Number of children: Not on file   Years of education: Not on file   Highest education level: Not on file  Occupational History   Not on file  Tobacco Use   Smoking status: Never   Smokeless tobacco: Never  Vaping Use   Vaping Use: Never used  Substance and Sexual Activity   Alcohol use: Yes    Comment: 07/23/2014 "might have a few drinks on holidays or at cookouts"   Drug use: No   Sexual activity: Yes  Other Topics Concern   Not on file  Social History Narrative   Coaches [employer]    Social Determinants of Health   Financial Resource Strain: Not on file  Food Insecurity: No Food Insecurity (08/03/2022)   Hunger Vital Sign    Worried About Running Out of Food  in the Last Year: Never true    Ran Out of Food in the Last Year: Never true  Transportation Needs: No Transportation Needs (08/03/2022)   PRAPARE - Hydrologist (Medical): No    Lack of Transportation (Non-Medical): No  Physical Activity: Insufficiently Active (10/19/2018)   Exercise Vital Sign    Days of Exercise per Week: 2 days    Minutes of Exercise per Session: 20 min  Stress: No Stress Concern Present (10/19/2018)   [place]    Feeling of Stress : Only a little  Social Connections: Moderately Integrated (10/19/2018)   Social Connection and Isolation Panel [NHANES]    Frequency of Communication with Friends and Family: Twice a week    Frequency of Social Gatherings with Friends and Family: Once a week    Attends Religious Services: More than 4 times per year    Active Member of [employer] or Organizations: Yes    Attends Archivist Meetings: 1 to 4 times per year    Marital Status: Divorced  Intimate Partner Violence: Not At Risk (10/19/2018)   Humiliation, Afraid, Rape, and Kick questionnaire    Fear of Current or Ex-Partner: No    Emotionally Abused: No    Physically Abused: No    Sexually Abused: No    Family History  Problem Relation Age of Onset   Stroke Mother    Fabry's disease Mother    Fabry's disease Brother    Colon cancer Neg Hx     Past Surgical History:  Procedure Laterality Date   CARDIAC CATHETERIZATION  03-12-2011  DR [place] WITH LV CAVITY  APPEARANCE CONSISTANT WITH SIGNIFICANT APICAL HYPERTROPHY/ NORMAL LVSF / EF 55%/ EVIDENCE OF DIASTOLIC DYSFUNCTION WITH EDP OF 23-37mmHg AFTER A-WAVE/ NORMAL CORONARY ARTERIES   EVALUATION UNDER ANESTHESIA WITH FISTULECTOMY N/A 06/23/2015   Procedure: EXAM UNDER ANESTHESIA WITH POSSIBLE FISTULOTOMY;  Surgeon: Judeth Horn, MD;  Location: [facility];  Service: General;  Laterality: N/A;    HERNIA REPAIR  0000000   Umbilical Hernia Repair   INCISION AND DRAINAGE PERIRECTAL ABSCESS Left 07/29/2014   Procedure: IRRIGATION AND DEBRIDEMENT PERIRECTAL  ABSCESS;  Surgeon: Doreen Salvage, MD;  Location: [facility];  Service: General;  Laterality: Left;  Prone position   INSERT / REPLACE / REMOVE PACEMAKER  02/12/2011   SJM implanted by Dr Rayann Heman for Mobitz II second degree AV block and syncope   IRRIGATION AND DEBRIDEMENT ABSCESS N/A 07/27/2013   Procedure: IRRIGATION AND DEBRIDEMENT OF SKIN, SOFT TISSUE AND MUSCLES OF UPPER BACK (11X19X4cm) WITH 10 BLADE AND PULSATILE LAVAGE ;  Surgeon: Gayland Curry, MD;  Location: [facility];  Service: General;  Laterality: N/A;   PPM GENERATOR CHANGEOUT N/A 05/22/2019   Procedure: PPM GENERATOR CHANGEOUT;  Surgeon: Thompson Grayer, MD;  Location: [facility];  Service: Cardiovascular;  Laterality: N/A;   UMBILICAL HERNIA REPAIR  03/22/2012   Procedure: HERNIA REPAIR UMBILICAL ADULT;  Surgeon: Leighton Ruff, MD;  Location: [facility];  Service: General;  Laterality: N/A;  Umbilical Hernia Repair     ROS: Review of Systems Negative except as stated above  PHYSICAL EXAM: BP 117/73 (BP Location: Left Arm, Patient Position: Sitting, Cuff Size: Large)   Pulse 73   Temp 97.9 F (36.6 C) (Oral)   Ht 5\' 10"  (1.778 m)   Wt 277 lb (125.6 kg)   SpO2 99%   BMI 39.75 kg/m   Wt Readings from Last 3 Encounters:  08/24/22 277 lb (125.6 kg)  06/25/22 276 lb (125.2 kg)  03/04/22 282 lb (127.9 kg)    Physical Exam   General appearance - alert, well appearing, and in no distress Neck - supple, no significant adenopathy Chest - clear to auscultation, no wheezes, rales or rhonchi, symmetric air entry Heart - normal rate, regular rhythm, normal S1, S2, no murmurs, rubs, clicks or gallops Extremities -patient with chronic lymphedema in both lower legs.  He is wearing compression socks.     Latest Ref Rng & Units 06/25/2022    3:32 PM 10/26/2021    9:45 AM 02/23/2021   10:26  AM  CMP  Glucose 70 - 99 mg/dL 410  111  169   BUN 8 - 27 mg/dL 30  20  27    Creatinine 0.76 - 1.27 mg/dL 2.26  1.86  1.88   Sodium 134 - 144 mmol/L 140  149  146   Potassium 3.5 - 5.2 mmol/L 4.5  4.3  4.7   Chloride 96 - 106 mmol/L 100  109  107   CO2 20 - 29 mmol/L 22  22  24    Calcium 8.6 - 10.2 mg/dL 9.6  9.8  9.7   Total Protein 6.0 - 8.5 g/dL 7.5  7.8    Total Bilirubin 0.0 - 1.2 mg/dL 0.9  0.5    Alkaline Phos 44 - 121 IU/L 132  138    AST 0 - 40 IU/L 33  33    ALT 0 - 44 IU/L 29  32     Lipid Panel     Component Value Date/Time   CHOL 168 10/26/2021 0945   TRIG 80 10/26/2021 0945   HDL 62 10/26/2021 0945   CHOLHDL 2.7 10/26/2021 0945   CHOLHDL 4.4 10/20/2018 0324   VLDL 18 10/20/2018 0324   LDLCALC 91 10/26/2021 0945    CBC    Component Value Date/Time   WBC 7.3 06/25/2022 1532   WBC 7.0 05/22/2019 1201   RBC 5.21 06/25/2022 1532   RBC 5.29 05/22/2019 1201   HGB 15.3 06/25/2022 1532   HCT 45.4 06/25/2022 1532   PLT 178 06/25/2022 1532  MCV 87 06/25/2022 1532   MCH 29.4 06/25/2022 1532   MCH 29.3 05/22/2019 1201   MCHC 33.7 06/25/2022 1532   MCHC 32.1 05/22/2019 1201   RDW 12.1 06/25/2022 1532   LYMPHSABS 2.9 08/23/2019 1152   MONOABS 1.3 (H) 10/24/2018 0515   EOSABS 0.3 08/23/2019 1152   BASOSABS 0.1 08/23/2019 1152    ASSESSMENT AND PLAN: 1. Type 2 diabetes mellitus with other circulatory complication, with long-term current use of insulin ([place]) Reported home blood sugar readings are good. Advised to continue Ozempic 0.5 mg daily for a total of 1 month.  As long as he continues to tolerate this dose, after being on it for 1 month, he can titrate it to the 1 mg weekly dose.  Continue NovoLog 70/30 24 units twice a day.  Encourage healthy eating habits. - Continuous Blood Gluc Receiver (FREESTYLE LIBRE 2 READER) DEVI; UAD  Dispense: 1 each; Refill: 0  2. Morbid obesity ([place]) See #1 above.  Hopefully we will begin to see some weight loss as Ozempic is  titrated up.  3. Hypertension associated with type 2 diabetes mellitus ([place]) At goal.  Continue medications listed above.  4. Drug-induced constipation Advised patient that it is okay for him to try a little bit of prune juice a few times a week.  Hold the Imodium for now.  5. Incontinence of feces, unspecified fecal incontinence type See #4 above.  Await appointment with a gastroenterologist.  6. Stage 3b chronic kidney disease ([place]) We will continue to monitor.  Discussed the importance of good diabetes control.  7. 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[place]) Since Dr. Marval Regal ordered the genetic testing, I recommend that he discuss with him on his next visit the results and whether the results indicate whether or not he can be changed to an oral form of Fabrazyme if an oral form exists.  8. Tetanus, diphtheria, and acellular pertussis (Tdap) vaccination declined     Patient was given the opportunity to ask questions.  Patient verbalized understanding of the plan and was able to repeat key elements of the plan.   This documentation was completed using Radio producer.  Any transcriptional errors are unintentional.  No orders of the defined types were placed in this encounter.    Requested Prescriptions    No prescriptions requested or ordered in this encounter    No follow-ups on file.  Karle Plumber, MD, FACP

## 2022-08-26 DIAGNOSIS — E7521 Fabry (-Anderson) disease: Secondary | ICD-10-CM | POA: Diagnosis not present

## 2022-09-02 ENCOUNTER — Encounter: Payer: Medicare HMO | Attending: Internal Medicine | Admitting: Dietician

## 2022-09-02 ENCOUNTER — Encounter: Payer: Self-pay | Admitting: Dietician

## 2022-09-02 NOTE — Progress Notes (Deleted)
Medical Nutrition Therapy  Appointment Start time:  [phone] Appointment End time:  [phone] Patient is here today alone.  He wqas last seen by this RD on 03/04/2022  Patient is here today alone.  He was last seen by this RD 07/03/2021 Primary concerns today: He would like to lose weight. Referral diagnosis: Type 2 Diabetes, obesity Preferred learning style: no preference indicated Learning readiness: ready   NUTRITION ASSESSMENT  He does not have a CGM. Has continued to be mindful of his portions and eats smaller portions. Drinks 7 oz regular soda occasionally (once every 2 weeks). Increased water Decreased chips. States he needs to improve exercise. He has lost 8 lbs in the past 5 months. A1C increased  He walks with a cane.   He has [employer] research officer, government.   Started walking at the gym on the treadmill.  He is concerned with falling.  He does work outside in his yard.  Anthropometrics  282 lbs 03/04/2022 290 lbs 10/01/2021 294 lbs 07/03/2021 with leather coat 287 lbs 01/02/2021 284 lbs 11/07/2020 288 lbs 11/06/2020 292 lbs 10/10/2020 298 lbs 08/15/2020 299 lbs 07/08/2020  Clinical Medical Hx: History includes:  Type 2 diabetes (2010) with microabluminuria, CKD stage 3, glaucoma, Fabry's disease (manifests as lymphedema), CVA, HTN, CHF, pacemaker Medications: see list to include:  Novolog 70/30 24 units bid Labs: A1C 9.9% 10/28/2021, 7.7% ( 11/07/2020), 7.8% (07/08/20) increased from 7.4% 01/2020, cholesterol 165, HDL 63, LDL 89, triglycerides 69 07/08/20, GFR 42 (07/08/20)   Lifestyle & Dietary Hx Patient lives alone.  He has started to cook his foods fresh more frequently.  He states that he bakes most of his meats.  He does his own shopping. He is retired due to his lymphedema.  He worked for [employer] and then [employer]. He walks with a cane.  Reports increased balance issues.  This has been since his stroke.   He is wearing compression socks and wraps on both feet for the  lymphedema.  Avoids Bananas as his potassium has been elevated in the past. Avoids added salt.  Uses Mrs. Dash or hot sauce. Avoids cookies. Reduced may. Choosing LS [place] and avoiding ham and other high sodium  Estimated daily fluid intake: 64 oz Supplements: none Sleep: States that he sleep late as he is fearful that a tree will fall on his house (no more than 5 hours per night plus naps during the day).  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  He does some squats and other exercises including walking in his home.  24-Hr Dietary Recall First Meal 7:30 am: waffle or [employer] toast and eggs OR cornflakes with berries, 2% milk Snack: none Second Meal: skips OR salad, small amounts dried tomato dressing, occasional swiss cheese, croutons, fruit cocktail Snack: none Third Meal 4-5 pm: salad, zebra cakes OR chicken breast, rice, vegetables Snack:  Beverages: water, almond milk, 1% milk, rare juice, 8 oz sweet tea occasionally, rare regular soda   NUTRITION DIAGNOSIS  NB-1.1 Food and nutrition-related knowledge deficit As related to balance of carbohydrate, protein, and fat.  As evidenced by diet hx and patient report.   NUTRITION INTERVENTION  Nutrition education (E-1) on the following topics:  Reviewed habits and provided encouragement for changes made Benefits of exercise Lower sodium reviewed Small portions meat Eating increased vegetables (unless potassium becomes elevated) Nutrition quality Label  reading  Handouts Provided Include - 07/2020 visit [facility] national kidney diet for those not on dialysis Meal plan card My Plate (10/3792) CKD Nutrition therapy stages 3-5 (not on dialysis) from AND (09/2021 visit) Label reading  Learning Style & Readiness for Change Teaching method utilized: Visual & Auditory  Demonstrated degree of  understanding via: Teach Back  Barriers to learning/adherence to lifestyle change: lymphadema  Goals Make your food decisions in the store.  Once you bring it in the house you will eat it. Read labels for portion size, sodium, carbohydrate content Choose lean meat and less red meat  1 portion of meat per day rather than 2  Drink more water and only very small amounts of juice.     Continue to choose beverages without sugar.  True Lemon or True Lime  Consistency with exercise.   Gym 2-3 times per week    Consider bike or other exercises   What would help with your motivation?   "An item in motion stays in motion?"  Work in your yard   MONITORING & EVALUATION Dietary intake, weekly physical activity, and label reading in 5 months.  Next Steps  Patient is to call or MyChart for questions.

## 2022-09-06 ENCOUNTER — Other Ambulatory Visit: Payer: Self-pay

## 2022-09-07 ENCOUNTER — Ambulatory Visit: Payer: Self-pay | Admitting: Licensed Clinical Social Worker

## 2022-09-08 NOTE — Patient Instructions (Signed)
Visit Information  Thank you for taking time to visit with me today. Please don't hesitate to contact me if I can be of assistance to you.   Following are the goals we discussed today:   Goals Addressed             This Visit's Progress    Obtain Supportive Resources-Surgery Support   On track    Activities and task to complete in order to accomplish goals.   Keep all upcoming appointment discussed today Continue with compliance of taking medication prescribed by Doctor  Continue utilizing support system to assist with stress management Follow up with Nephrologist regarding wait list to discuss lab results and tx plan                   Our next appointment is by telephone on 4/30 at 10 AM  Please call the care guide team at [phone] if you need to cancel or reschedule your appointment.   If you are experiencing a Mental Health or [place] or need someone to talk to, please call the Suicide and Crisis Lifeline: 988 call 911   Patient verbalizes understanding of instructions and care plan provided today and agrees to view in [place]. Active MyChart status and patient understanding of how to access instructions and care plan via MyChart confirmed with patient.     [name], MSW, [email or web address] Phone [phone] 8:21 AM

## 2022-09-08 NOTE — Patient Outreach (Signed)
  Care Coordination   Follow Up Visit Note   09/07/22 Name: GILAD VANWIE MRN: [id number] DOB: Aug 20, 1960  ARMISTEAD HUNTING is a 62 y.o. year old male who sees Ladell Pier, MD for primary care. I spoke with  [name] by phone today.  What matters to the patients health and wellness today?  Symptom Management    Goals Addressed             This Visit's Progress    Obtain Supportive Resources-Surgery Support   On track    Activities and task to complete in order to accomplish goals.   Keep all upcoming appointment discussed today Continue with compliance of taking medication prescribed by Doctor  Continue utilizing support system to assist with stress management Follow up with Nephrologist regarding wait list to discuss lab results and tx plan                   SDOH assessments and interventions completed:  No     Care Coordination Interventions:  Yes, provided  Interventions Today    Flowsheet Row Most Recent Value  Chronic Disease   Chronic disease during today's visit Hypertension (HTN), Diabetes, Chronic Kidney Disease/End Stage Renal Disease (ESRD), Other  [place].Cumber Disease]  General Interventions   General Interventions Discussed/Reviewed General Interventions Reviewed, Doctor Visits  Doctor Visits Discussed/Reviewed Doctor Visits Reviewed, [place] Reviewed, Coping Strategies       Follow up plan: Follow up call scheduled for 4-6 weeks    Encounter Outcome:  Pt. Visit Completed   [name], MSW, [email or web address] Phone [phone] 8:20 AM

## 2022-09-09 ENCOUNTER — Encounter: Payer: Self-pay | Admitting: Pharmacist

## 2022-09-10 DIAGNOSIS — E7521 Fabry (-Anderson) disease: Secondary | ICD-10-CM | POA: Diagnosis not present

## 2022-09-13 ENCOUNTER — Telehealth: Payer: Self-pay | Admitting: Licensed Clinical Social Worker

## 2022-09-13 ENCOUNTER — Encounter: Payer: Self-pay | Admitting: Physician Assistant

## 2022-09-14 NOTE — Patient Instructions (Signed)
Visit Information  Thank you for taking time to visit with me today. Please don't hesitate to contact me if I can be of assistance to you.   Following are the goals we discussed today:   Goals Addressed             This Visit's Progress    Obtain Supportive Resources-Surgery Support   On track    Activities and task to complete in order to accomplish goals.   Keep all upcoming appointment discussed today Continue with compliance of taking medication prescribed by Doctor  Continue utilizing support system to assist with stress management Follow up with Nephrologist regarding wait list to discuss lab results and tx plan                   Our next appointment is by telephone on 4/30 at 10 AM  Please call the care guide team at [phone] if you need to cancel or reschedule your appointment.   If you are experiencing a Mental Health or Behavioral Health Crisis or need someone to talk to, please call the Suicide and Crisis Lifeline: 988 call 911   Patient verbalizes understanding of instructions and care plan provided today and agrees to view in MyChart. Active MyChart status and patient understanding of how to access instructions and care plan via MyChart confirmed with patient.     Jenel Lucks, MSW, LCSW [facility] Care Management [place]  [facility] [email or web address] Phone [phone] 4:34 PM

## 2022-09-14 NOTE — Patient Outreach (Signed)
  Care Coordination   Follow Up Visit Note   09/13/22 Name: [name] MRN: [id number] DOB: Oct 04, 1960  [name] is a 62 y.o. year old male who sees [name] Matar, MD for primary care. I spoke with  [name] by phone today.  What matters to the patients health and wellness today?  Care Coordination    Goals Addressed             This Visit's Progress    Obtain Supportive Resources-Surgery Support   On track    Activities and task to complete in order to accomplish goals.   Keep all upcoming appointment discussed today Continue with compliance of taking medication prescribed by Doctor  Continue utilizing support system to assist with stress management Follow up with Nephrologist regarding wait list to discuss lab results and tx plan                   SDOH assessments and interventions completed:  No     Care Coordination Interventions:  Yes, provided  Interventions Today    Flowsheet Row Most Recent Value  Chronic Disease   Chronic disease during today's visit Hypertension (HTN), Chronic Kidney Disease/End Stage Renal Disease (ESRD)  [Fabry Disease,  Chronic diastolic HF,  Hx of CVA]  General Interventions   General Interventions Discussed/Reviewed General Interventions Reviewed  [LCSW provided contact information for specialist, per pt request. He agreed to f/up with them today]       Follow up plan: Follow up call scheduled for 2-4 weeks    Encounter Outcome:  Pt. Visit Completed   [name], MSW, LCSW [facility] Care Management [facility]  [facility] [name] Phone [phone] 4:33 PM'

## 2022-09-20 ENCOUNTER — Ambulatory Visit (INDEPENDENT_AMBULATORY_CARE_PROVIDER_SITE_OTHER): Payer: Medicare HMO

## 2022-09-20 DIAGNOSIS — I441 Atrioventricular block, second degree: Secondary | ICD-10-CM | POA: Diagnosis not present

## 2022-09-21 LAB — CUP PACEART REMOTE DEVICE CHECK
Battery Remaining Longevity: 49 mo
Battery Remaining Percentage: 56 %
Battery Voltage: 2.99 V
Brady Statistic AP VP Percent: 77 %
Brady Statistic AP VS Percent: 1 %
Brady Statistic AS VP Percent: 23 %
Brady Statistic AS VS Percent: 1 %
Brady Statistic RA Percent Paced: 76 %
Brady Statistic RV Percent Paced: 99 %
Date Time Interrogation Session: 20240415030812
Implantable Lead Connection Status: 753985
Implantable Lead Connection Status: 753985
Implantable Lead Implant Date: 20120907
Implantable Lead Implant Date: 20120907
Implantable Lead Location: 753859
Implantable Lead Location: 753860
Implantable Pulse Generator Implant Date: 20201215
Lead Channel Impedance Value: 300 Ohm
Lead Channel Impedance Value: 390 Ohm
Lead Channel Pacing Threshold Amplitude: 0.625 V
Lead Channel Pacing Threshold Amplitude: 1.875 V
Lead Channel Pacing Threshold Pulse Width: 0.5 ms
Lead Channel Pacing Threshold Pulse Width: 0.8 ms
Lead Channel Sensing Intrinsic Amplitude: 5 mV
Lead Channel Sensing Intrinsic Amplitude: 9.2 mV
Lead Channel Setting Pacing Amplitude: 1.625
Lead Channel Setting Pacing Amplitude: 2.125
Lead Channel Setting Pacing Pulse Width: 0.8 ms
Lead Channel Setting Sensing Sensitivity: 4 mV
Pulse Gen Model: 2272
Pulse Gen Serial Number: 9187005

## 2022-09-23 ENCOUNTER — Telehealth: Payer: Self-pay | Admitting: Internal Medicine

## 2022-09-23 NOTE — Telephone Encounter (Signed)
Contacted [name] to schedule their annual wellness visit. Appointment made for 09/30/22.  [name] AWV direct phone # [phone]

## 2022-09-23 NOTE — Telephone Encounter (Signed)
Called patient to schedule Medicare Annual Wellness Visit (AWV). Left message for patient to call back and schedule Medicare Annual Wellness Visit (AWV).  Last date of AWV: 11/30/21  If any questions, please contact me at [phone].  Thank you ,  [name] AWV direct phone # [phone]

## 2022-09-24 DIAGNOSIS — E7521 Fabry (-Anderson) disease: Secondary | ICD-10-CM | POA: Diagnosis not present

## 2022-09-30 ENCOUNTER — Ambulatory Visit: Payer: Medicare HMO | Attending: Internal Medicine

## 2022-09-30 VITALS — Ht 70.0 in | Wt 281.0 lb

## 2022-09-30 DIAGNOSIS — Z0000 Encounter for general adult medical examination without abnormal findings: Secondary | ICD-10-CM | POA: Diagnosis not present

## 2022-09-30 NOTE — Progress Notes (Signed)
I connected with  [name] on 09/30/22 by a audio enabled telemedicine application and verified that I am speaking with the correct person using two identifiers.  Patient Location: Home  Provider Location: Office/Clinic  I discussed the limitations of evaluation and management by telemedicine. The patient expressed understanding and agreed to proceed.  Subjective:   [name] is a 62 y.o. male who presents for Medicare Annual/Subsequent preventive examination.  Review of Systems     Cardiac Risk Factors include: advanced age (>21men, >62 women);diabetes mellitus;hypertension;male gender;obesity (BMI >30kg/m2)     Objective:    Today's Vitals   09/30/22 0929  Weight: 281 lb (127.5 kg)  Height: 5\' 10"  (1.778 m)   Body mass index is 40.32 kg/m.     09/30/2022    9:38 AM 11/30/2021    9:13 AM 10/16/2020   10:31 AM 08/15/2020    1:18 PM 07/23/2019    9:35 AM 05/22/2019   12:23 PM 10/23/2018    4:00 PM  Advanced Directives  Does Patient Have a Medical Advance Directive? No No No Yes No No   Does patient want to make changes to medical advance directive?   No - Patient declined    No - Patient declined  Would patient like information on creating a medical advance directive?  No - Patient declined No - Patient declined  No - Patient declined No - Patient declined     Current Medications (verified) Outpatient Encounter Medications as of 09/30/2022  Medication Sig   Accu-Chek FastClix Lancets MISC 1 each by Other route 3 (three) times daily.   acetaminophen (TYLENOL) 325 MG tablet Take 1-2 tablets (325-650 mg total) by mouth every 4 (four) hours as needed for mild pain.   Agalsidase beta (FABRAZYME IV) Inject 115 mg into the vein every 14 (fourteen) days.    albuterol (PROAIR HFA) 108 (90 Base) MCG/ACT inhaler Inhale 1 puff into the lungs every 6 (six) hours as needed for wheezing or shortness of breath.   allopurinol (ZYLOPRIM) 100 MG tablet TAKE 3 TABLETS BY MOUTH  EVERY DAY   amLODipine (NORVASC) 10 MG tablet Take 1 tablet (10 mg total) by mouth daily.   atorvastatin (LIPITOR) 80 MG tablet TAKE 1 TABLET BY MOUTH DAILY AT 6 PM.   clopidogrel (PLAVIX) 75 MG tablet Take 1 tablet (75 mg total) by mouth daily.   doxazosin (CARDURA) 2 MG tablet Take 0.5 tablets (1 mg total) by mouth daily.   fexofenadine (ALLEGRA) 180 MG tablet Take 180 mg by mouth every 14 (fourteen) days.   fluticasone (FLONASE) 50 MCG/ACT nasal spray PLACE 1 SPRAY INTO BOTH NOSTRILS DAILY AS NEEDED FOR ALLERGIES.   furosemide (LASIX) 20 MG tablet Take 1 tablet (20 mg total) by mouth daily.   glucose blood (ONETOUCH VERIO) test strip Use to check blood sugar three times daily.   insulin aspart protamine - aspart (NOVOLOG MIX 70/30 FLEXPEN) (70-30) 100 UNIT/ML FlexPen Inject 28 Units into the skin 2 (two) times daily with a meal.   Insulin Pen Needle (PX SHORTLENGTH PEN NEEDLES) 31G X 8 MM MISC 1 each by Does not apply route 2 (two) times daily before a meal.   Semaglutide,0.25 or 0.5MG /DOS, (OZEMPIC, 0.25 OR 0.5 MG/DOSE,) 2 MG/3ML SOPN Inject 0.5 mg into the skin once weekly for 4 weeks. Once you complete 4 injections, start the next dose (1mg  pen).   valsartan-hydrochlorothiazide (DIOVAN-HCT) 80-12.5 MG tablet Take 1 tablet by mouth daily.   Continuous Blood Gluc Receiver (  FREESTYLE LIBRE 2 READER) DEVI UAD (Patient not taking: Reported on 09/30/2022)   Continuous Blood Gluc Sensor (FREESTYLE LIBRE SENSOR SYSTEM) MISC Change sensor Q 2 wks (Patient not taking: Reported on 09/30/2022)   mupirocin ointment (BACTROBAN) 2 % Apply to affected area daily with dressing change.   Prednisol Ace-Moxiflox-Bromfen 1-0.5-0.075 % SUSP Place 1 drop into the right eye 4 (four) times daily. Start 1 day before the surgery on 10/19/21 (Patient not taking: Reported on 08/24/2022)   Semaglutide, 1 MG/DOSE, 4 MG/3ML SOPN Inject 1 mg as directed once a week. Begin once you complete the 0.25mg  and 0.5mg  doses. Continue the  1mg  weekly dose thereafter. (Patient not taking: Reported on 09/30/2022)   Semaglutide,0.25 or 0.5MG /DOS, (OZEMPIC, 0.25 OR 0.5 MG/DOSE,) 2 MG/3ML SOPN Inject 0.25 mg into the skin every 7 (seven) days. (Patient not taking: Reported on 09/30/2022)   No facility-administered encounter medications on file as of 09/30/2022.    Allergies (verified) Lisinopril and Shellfish allergy   History: Past Medical History:  Diagnosis Date   Allergy [id number]   Dont know. Isnt it O/F   Bell's palsy    Bifascicular block    Cataract 11/05/2021   Chronic bronchitis    "seasonal; get it q yr"   Chronic diastolic CHF (congestive heart failure) 2010   Chronic kidney disease (CKD), stage II (mild)    stage II to III/notes 07/23/2014   Diastolic heart failure secondary to hypertrophic cardiomyopathy CARDIOLOGIST-- DR Verdis Prime   Dyspnea    increased exertion    Edema 07/2013   Fabry's disease RENAL AND CARDIAC INVOVLEMENT   FOLLOWED DR [place]   Gout, arthritis 2014   bil feet. right worse   History of cellulitis of skin with lymphangitis LEFT LEG   HOCM (hypertrophic obstructive cardiomyopathy)    a. Echo 11/16: Severe LVH, EF 55-60%, abnormal GLS consistent with HOCM, no SAM, mild LAE   Hypertension 20 years   Lymphedema of lower extremity LEFT  >  RIGHT   "using ankle high socks at home; pump doesn't work for me" (07/23/2014)   Pacemaker    02/12/11   Pneumonia 08/2011   Seasonal asthma NO INHALERS   30 years   Second degree Mobitz II AV block    with syncope, s/p PPM   Short of breath on exertion    Stroke 10/23/2017   Type II diabetes mellitus 2012   INSULIN DEPENDENT   Past Surgical History:  Procedure Laterality Date   CARDIAC CATHETERIZATION  03-12-2011  DR Verdis Prime   HYPERTROPHIC CARDIOMYOPATHY WITH LV CAVITY  APPEARANCE CONSISTANT WITH SIGNIFICANT APICAL HYPERTROPHY/ NORMAL LVSF / EF 55%/ EVIDENCE OF DIASTOLIC DYSFUNCTION WITH EDP OF 23-72mmHg AFTER A-WAVE/ NORMAL CORONARY  ARTERIES   EVALUATION UNDER ANESTHESIA WITH FISTULECTOMY N/A 06/23/2015   Procedure: EXAM UNDER ANESTHESIA WITH POSSIBLE FISTULOTOMY;  Surgeon: Jimmye Norman, MD;  Location: [facility] OR;  Service: General;  Laterality: N/A;   HERNIA REPAIR  06/08/2011   Umbilical Hernia Repair   INCISION AND DRAINAGE PERIRECTAL ABSCESS Left 07/29/2014   Procedure: IRRIGATION AND DEBRIDEMENT PERIRECTAL ABSCESS;  Surgeon: Frederik Schmidt, MD;  Location: [facility] OR;  Service: General;  Laterality: Left;  Prone position   INSERT / REPLACE / REMOVE PACEMAKER  02/12/2011   SJM implanted by Dr Johney Frame for Mobitz II second degree AV block and syncope   IRRIGATION AND DEBRIDEMENT ABSCESS N/A 07/27/2013   Procedure: IRRIGATION AND DEBRIDEMENT OF SKIN, SOFT TISSUE AND MUSCLES OF UPPER BACK (11X19X4cm) WITH 10 BLADE AND  PULSATILE LAVAGE ;  Surgeon: Atilano Ina, MD;  Location: [facility] OR;  Service: General;  Laterality: N/A;   PPM GENERATOR CHANGEOUT N/A 05/22/2019   Procedure: PPM GENERATOR CHANGEOUT;  Surgeon: Hillis Range, MD;  Location: [facility] INVASIVE CV LAB;  Service: Cardiovascular;  Laterality: N/A;   UMBILICAL HERNIA REPAIR  03/22/2012   Procedure: HERNIA REPAIR UMBILICAL ADULT;  Surgeon: Romie Levee, MD;  Location: [facility] ORS;  Service: General;  Laterality: N/A;  Umbilical Hernia Repair    Family History  Problem Relation Age of Onset   Stroke Mother    Fabry's disease Mother    Fabry's disease Brother    Colon cancer Neg Hx    Social History   Socioeconomic History   Marital status: Divorced    Spouse name: Not on file   Number of children: Not on file   Years of education: Not on file   Highest education level: Not on file  Occupational History   Not on file  Tobacco Use   Smoking status: Never   Smokeless tobacco: Never   Tobacco comments:    Never smoked. Tobacco or cannabis  Vaping Use   Vaping Use: Never used  Substance and Sexual Activity   Alcohol use: Yes    Comment: 07/23/2014 "might have a few drinks on  holidays or at cookouts"   Drug use: No   Sexual activity: Not Currently  Other Topics Concern   Not on file  Social History Narrative   Coaches [employer]    Social Determinants of Health   Financial Resource Strain: Low Risk  (09/26/2022)   Overall Financial Resource Strain (CARDIA)    Difficulty of Paying Living Expenses: Not hard at all  Food Insecurity: No Food Insecurity (09/26/2022)   Hunger Vital Sign    Worried About Running Out of Food in the Last Year: Never true    Ran Out of Food in the Last Year: Never true  Transportation Needs: No Transportation Needs (09/26/2022)   PRAPARE - Administrator, Civil Service (Medical): No    Lack of Transportation (Non-Medical): No  Physical Activity: Insufficiently Active (09/26/2022)   Exercise Vital Sign    Days of Exercise per Week: 3 days    Minutes of Exercise per Session: 30 min  Stress: No Stress Concern Present (09/26/2022)   [employer] of Occupational Health - Occupational Stress Questionnaire    Feeling of Stress : Not at all  Social Connections: Unknown (09/26/2022)   Social Connection and Isolation Panel [NHANES]    Frequency of Communication with Friends and Family: More than three times a week    Frequency of Social Gatherings with Friends and Family: Patient declined    Attends Religious Services: Not on Insurance claims handler of Clubs or Organizations: No    Attends Banker Meetings: Never    Marital Status: Divorced    Tobacco Counseling Counseling given: Not Answered Tobacco comments: Never smoked. Tobacco or cannabis   Clinical Intake:  Pre-visit preparation completed: Yes  Pain : No/denies pain     Nutritional Status: BMI > 30  Obese Nutritional Risks: None Diabetes: Yes  How often do you need to have someone help you when you read instructions, pamphlets, or other written materials from your doctor or pharmacy?: 1 - Never  Diabetic? Yes Nutrition Risk  Assessment:  Has the patient had any N/V/D within the last 2 months?  No  Does the patient have any non-healing wounds?  No  Has the patient had any unintentional weight loss or weight gain?  No   Diabetes:  Is the patient diabetic?  Yes  If diabetic, was a CBG obtained today?  No  Did the patient bring in their glucometer from home?  No  How often do you monitor your CBG's? Twice daily.   Financial Strains and Diabetes Management:  Are you having any financial strains with the device, your supplies or your medication? No .  Does the patient want to be seen by Chronic Care Management for management of their diabetes?  No  Would the patient like to be referred to a Nutritionist or for Diabetic Management?  No   Diabetic Exams:  Diabetic Eye Exam: Completed 07/05/2022 Diabetic Foot Exam: Completed 10/26/2021   Interpreter Needed?: No  Information entered by :: NAllen LPN   Activities of Daily Living    09/26/2022    4:26 PM 11/30/2021    9:17 AM  In your present state of health, do you have any difficulty performing the following activities:  Hearing? 0 1  Comment  Uses hearing aids and still finds it difficult to hear. He sees an Biomedical scientist.  Vision? 0 0  Comment  Sees Dr. Dione Booze - has upcoming ctaract removal surgery  Difficulty concentrating or making decisions? 0 0  Walking or climbing stairs? 0 0  Dressing or bathing? 0 0  Doing errands, shopping? 0 0  Comment  Still drives  Preparing Food and eating ? N Y  Using the Toilet? N N  In the past six months, have you accidently leaked urine? N N  Do you have problems with loss of bowel control? N N  Managing your Medications? N N  Managing your Finances? N N  Housekeeping or managing your Housekeeping? N N    Patient Care Team: Marcine Matar, MD as PCP - General (Internal Medicine) Lyn Records, MD (Inactive) as Consulting Physician (Cardiology) Hillis Range, MD (Inactive) as Consulting Physician (Clinical  Cardiac Electrophysiology) Terrial Rhodes, MD as Consulting Physician (Nephrology)  Indicate any recent Medical Services you may have received from other than [facility] providers in the past year (date may be approximate).     Assessment:   This is a routine wellness examination for [name].  Hearing/Vision screen Vision Screening - Comments:: Regular eye exams, [facility]  Dietary issues and exercise activities discussed: Current Exercise Habits: Home exercise routine, Type of exercise: walking (jumps rope), Time (Minutes): 30, Frequency (Times/Week): 3, Weekly Exercise (Minutes/Week): 90   Goals Addressed             This Visit's Progress    Patient Stated       09/30/2022, wants to lose weight and start hello fresh meal prep       Depression Screen    09/30/2022    9:39 AM 08/24/2022    9:43 AM 06/25/2022    2:25 PM 11/30/2021    9:15 AM 11/07/2020    9:44 AM 10/16/2020   10:30 AM 08/15/2020    1:18 PM  PHQ 2/9 Scores  PHQ - 2 Score 0 0 0 0 0 0 0  PHQ- 9 Score  0 0        Fall Risk    09/26/2022    4:26 PM 08/24/2022    9:40 AM 11/30/2021    9:14 AM 11/07/2020    9:44 AM 10/16/2020   10:30 AM  Fall Risk   Falls in the past year? 0  0 0 0 0  Number falls in past yr: 0 0 0 0 0  Injury with Fall? 0 0 0 0 0  Risk for fall due to : Medication side effect;Impaired mobility;Impaired balance/gait No Fall Risks  No Fall Risks No Fall Risks  Follow up Falls prevention discussed;Education provided;Falls evaluation completed  Falls evaluation completed;Education provided;Falls prevention discussed      FALL RISK PREVENTION PERTAINING TO THE HOME:  Any stairs in or around the home? Yes  If so, are there any without handrails? No  Home free of loose throw rugs in walkways, pet beds, electrical cords, etc? Yes  Adequate lighting in your home to reduce risk of falls? Yes   ASSISTIVE DEVICES UTILIZED TO PREVENT FALLS:  Life alert? No  Use of a cane, walker or w/c? Yes  Grab  bars in the bathroom? No  Shower chair or bench in shower? Yes  Elevated toilet seat or a handicapped toilet? No   TIMED UP AND GO:  Was the test performed? No .      Cognitive Function:    11/30/2021    9:23 AM 10/16/2020   10:31 AM  MMSE - Mini Mental State Exam  Orientation to time 5 5  Orientation to Place 5 5  Registration 3 3  Attention/ Calculation 5 5  Recall 3 3  Language- name 2 objects 2 2  Language- repeat 1 1  Language- follow 3 step command 3 3  Language- read & follow direction 1 1  Write a sentence 1 1  Copy design 1 0  Total score 30 29        09/30/2022    9:40 AM  6CIT Screen  What Year? 0 points  What month? 0 points  What time? 0 points  Count back from 20 0 points  Months in reverse 0 points  Repeat phrase 0 points  Total Score 0 points    Immunizations Immunization History  Administered Date(s) Administered   Influenza Split 08/21/2011, 07/11/2012   Influenza,inj,Quad PF,6+ Mos 04/21/2015, 05/12/2017, 05/29/2018, 05/23/2019, 07/08/2020, 02/23/2021   PFIZER(Purple Top)SARS-COV-2 Vaccination 09/13/2019, 10/08/2019, 07/11/2020   PNEUMOCOCCAL CONJUGATE-20 02/23/2021   Pneumococcal Polysaccharide-23 08/21/2011, 05/23/2019   Zoster Recombinat (Shingrix) 10/16/2020    TDAP status: Due, Education has been provided regarding the importance of this vaccine. Advised may receive this vaccine at local pharmacy or Health Dept. Aware to provide a copy of the vaccination record if obtained from local pharmacy or Health Dept. Verbalized acceptance and understanding.  Flu Vaccine status: Up to date  Pneumococcal vaccine status: Up to date  Covid-19 vaccine status: Completed vaccines  Qualifies for Shingles Vaccine? Yes   Zostavax completed No   Shingrix Completed?: need second dose  Screening Tests Health Maintenance  Topic Date Due   DTaP/Tdap/Td (1 - Tdap) Never done   COVID-19 Vaccine (4 - 2023-24 season) 02/05/2022   Diabetic kidney  evaluation - Urine ACR  10/27/2022   Zoster Vaccines- Shingrix (2 of 2) 06/26/2023 (Originally 12/11/2020)   FOOT EXAM  10/27/2022   Medicare Annual Wellness (AWV)  12/01/2022   HEMOGLOBIN A1C  12/24/2022   INFLUENZA VACCINE  01/06/2023   Diabetic kidney evaluation - eGFR measurement  06/26/2023   OPHTHALMOLOGY EXAM  07/06/2023   COLONOSCOPY (Pts 45-30yrs Insurance coverage will need to be confirmed)  01/23/2024   Hepatitis C Screening  Completed   HIV Screening  Completed   HPV VACCINES  Aged Out    Health Maintenance  Health Maintenance Due  Topic Date Due   DTaP/Tdap/Td (1 - Tdap) Never done   COVID-19 Vaccine (4 - 2023-24 season) 02/05/2022   Diabetic kidney evaluation - Urine ACR  10/27/2022    Colorectal cancer screening: Type of screening: Colonoscopy. Completed 01/22/2014. Repeat every 10 years  Lung Cancer Screening: (Low Dose CT Chest recommended if Age 37-80 years, 30 pack-year currently smoking OR have quit w/in 15years.) does not qualify.   Lung Cancer Screening Referral: no  Additional Screening:  Hepatitis C Screening: does qualify; Completed 07/11/2015  Vision Screening: Recommended annual ophthalmology exams for early detection of glaucoma and other disorders of the eye. Is the patient up to date with their annual eye exam?  Yes  Who is the provider or what is the name of the office in which the patient attends annual eye exams? Dr. Dione Booze  If pt is not established with a provider, would they like to be referred to a provider to establish care? No .   Dental Screening: Recommended annual dental exams for proper oral hygiene  Community Resource Referral / Chronic Care Management: CRR required this visit?  No   CCM required this visit?  No      Plan:     I have personally reviewed and noted the following in the patient's chart:   Medical and social history Use of alcohol, tobacco or illicit drugs  Current medications and supplements including opioid  prescriptions. Patient is not currently taking opioid prescriptions. Functional ability and status Nutritional status Physical activity Advanced directives List of other physicians Hospitalizations, surgeries, and ER visits in previous 12 months Vitals Screenings to include cognitive, depression, and falls Referrals and appointments  In addition, I have reviewed and discussed with patient certain preventive protocols, quality metrics, and best practice recommendations. A written personalized care plan for preventive services as well as general preventive health recommendations were provided to patient.     Barb Merino, LPN   [id number]/82/[id number]   Nurse Notes: none  Due to this being a virtual visit, the after visit summary with patients personalized plan was offered to patient via mail or my-chart. Patient would like to access on my-chart

## 2022-09-30 NOTE — Patient Instructions (Signed)
[name] , Thank you for taking time to come for your Medicare Wellness Visit. I appreciate your ongoing commitment to your health goals. Please review the following plan we discussed and let me know if I can assist you in the future.   These are the goals we discussed:  Goals      Blood Pressure < 140/90     HEMOGLOBIN A1C < 7.0     Obtain Supportive Resources-Surgery Support     Activities and task to complete in order to accomplish goals.   Keep all upcoming appointment discussed today Continue with compliance of taking medication prescribed by Doctor  Continue utilizing support system to assist with stress management Follow up with Nephrologist regarding wait list to discuss lab results and tx plan                Patient Stated     09/30/2022, wants to lose weight and start hello fresh meal prep     Weight (lb) < 200 lb (90.7 kg)     Patient wants to get down to 180 pounds so that he would look better and is close.        This is a list of the screening recommended for you and due dates:  Health Maintenance  Topic Date Due   DTaP/Tdap/Td vaccine (1 - Tdap) Never done   COVID-19 Vaccine (4 - 2023-24 season) 02/05/2022   Yearly kidney health urinalysis for diabetes  10/27/2022   Zoster (Shingles) Vaccine (2 of 2) 06/26/2023*   Complete foot exam   10/27/2022   Hemoglobin A1C  12/24/2022   Flu Shot  01/06/2023   Yearly kidney function blood test for diabetes  06/26/2023   Eye exam for diabetics  07/06/2023   Medicare Annual Wellness Visit  09/30/2023   Colon Cancer Screening  01/23/2024   Hepatitis C Screening: USPSTF Recommendation to screen - Ages 18-79 yo.  Completed   HIV Screening  Completed   HPV Vaccine  Aged Out  *Topic was postponed. The date shown is not the original due date.    Advanced directives: Advance directive discussed with you today. .  Conditions/risks identified: none  Next appointment: Follow up in one year for your annual wellness  visit   Preventive Care 40-64 Years, Male Preventive care refers to lifestyle choices and visits with your health care provider that can promote health and wellness. What does preventive care include? A yearly physical exam. This is also called an annual well check. Dental exams once or twice a year. Routine eye exams. Ask your health care provider how often you should have your eyes checked. Personal lifestyle choices, including: Daily care of your teeth and gums. Regular physical activity. Eating a healthy diet. Avoiding tobacco and drug use. Limiting alcohol use. Practicing safe sex. Taking low-dose aspirin every day starting at age 68. What happens during an annual well check? The services and screenings done by your health care provider during your annual well check will depend on your age, overall health, lifestyle risk factors, and family history of disease. Counseling  Your health care provider may ask you questions about your: Alcohol use. Tobacco use. Drug use. Emotional well-being. Home and relationship well-being. Sexual activity. Eating habits. Work and work Astronomer. Screening  You may have the following tests or measurements: Height, weight, and BMI. Blood pressure. Lipid and cholesterol levels. These may be checked every 5 years, or more frequently if you are over 53 years old. Skin check. Lung cancer  screening. You may have this screening every year starting at age 79 if you have a 30-pack-year history of smoking and currently smoke or have quit within the past 15 years. Fecal occult blood test (FOBT) of the stool. You may have this test every year starting at age 51. Flexible sigmoidoscopy or colonoscopy. You may have a sigmoidoscopy every 5 years or a colonoscopy every 10 years starting at age 22. Prostate cancer screening. Recommendations will vary depending on your family history and other risks. Hepatitis C blood test. Hepatitis B blood test. Sexually  transmitted disease (STD) testing. Diabetes screening. This is done by checking your blood sugar (glucose) after you have not eaten for a while (fasting). You may have this done every 1-3 years. Discuss your test results, treatment options, and if necessary, the need for more tests with your health care provider. Vaccines  Your health care provider may recommend certain vaccines, such as: Influenza vaccine. This is recommended every year. Tetanus, diphtheria, and acellular pertussis (Tdap, Td) vaccine. You may need a Td booster every 10 years. Zoster vaccine. You may need this after age 76. Pneumococcal 13-valent conjugate (PCV13) vaccine. You may need this if you have certain conditions and have not been vaccinated. Pneumococcal polysaccharide (PPSV23) vaccine. You may need one or two doses if you smoke cigarettes or if you have certain conditions. Talk to your health care provider about which screenings and vaccines you need and how often you need them. This information is not intended to replace advice given to you by your health care provider. Make sure you discuss any questions you have with your health care provider. Document Released: 06/20/2015 Document Revised: 02/11/2016 Document Reviewed: 03/25/2015 Elsevier Interactive Patient Education  2017 [employer].  Fall Prevention in the Home Falls can cause injuries. They can happen to people of all ages. There are many things you can do to make your home safe and to help prevent falls. What can I do on the outside of my home? Regularly fix the edges of walkways and driveways and fix any cracks. Remove anything that might make you trip as you walk through a door, such as a raised step or threshold. Trim any bushes or trees on the path to your home. Use bright outdoor lighting. Clear any walking paths of anything that might make someone trip, such as rocks or tools. Regularly check to see if handrails are loose or broken. Make sure that  both sides of any steps have handrails. Any raised decks and porches should have guardrails on the edges. Have any leaves, snow, or ice cleared regularly. Use sand or salt on walking paths during winter. Clean up any spills in your garage right away. This includes oil or grease spills. What can I do in the bathroom? Use night lights. Install grab bars by the toilet and in the tub and shower. Do not use towel bars as grab bars. Use non-skid mats or decals in the tub or shower. If you need to sit down in the shower, use a plastic, non-slip stool. Keep the floor dry. Clean up any water that spills on the floor as soon as it happens. Remove soap buildup in the tub or shower regularly. Attach bath mats securely with double-sided non-slip rug tape. Do not have throw rugs and other things on the floor that can make you trip. What can I do in the bedroom? Use night lights. Make sure that you have a light by your bed that is easy to reach.  Do not use any sheets or blankets that are too big for your bed. They should not hang down onto the floor. Have a firm chair that has side arms. You can use this for support while you get dressed. Do not have throw rugs and other things on the floor that can make you trip. What can I do in the kitchen? Clean up any spills right away. Avoid walking on wet floors. Keep items that you use a lot in easy-to-reach places. If you need to reach something above you, use a strong step stool that has a grab bar. Keep electrical cords out of the way. Do not use floor polish or wax that makes floors slippery. If you must use wax, use non-skid floor wax. Do not have throw rugs and other things on the floor that can make you trip. What can I do with my stairs? Do not leave any items on the stairs. Make sure that there are handrails on both sides of the stairs and use them. Fix handrails that are broken or loose. Make sure that handrails are as long as the stairways. Check  any carpeting to make sure that it is firmly attached to the stairs. Fix any carpet that is loose or worn. Avoid having throw rugs at the top or bottom of the stairs. If you do have throw rugs, attach them to the floor with carpet tape. Make sure that you have a light switch at the top of the stairs and the bottom of the stairs. If you do not have them, ask someone to add them for you. What else can I do to help prevent falls? Wear shoes that: Do not have high heels. Have rubber bottoms. Are comfortable and fit you well. Are closed at the toe. Do not wear sandals. If you use a stepladder: Make sure that it is fully opened. Do not climb a closed stepladder. Make sure that both sides of the stepladder are locked into place. Ask someone to hold it for you, if possible. Clearly mark and make sure that you can see: Any grab bars or handrails. First and last steps. Where the edge of each step is. Use tools that help you move around (mobility aids) if they are needed. These include: Canes. Walkers. Scooters. Crutches. Turn on the lights when you go into a dark area. Replace any light bulbs as soon as they burn out. Set up your furniture so you have a clear path. Avoid moving your furniture around. If any of your floors are uneven, fix them. If there are any pets around you, be aware of where they are. Review your medicines with your doctor. Some medicines can make you feel dizzy. This can increase your chance of falling. Ask your doctor what other things that you can do to help prevent falls. This information is not intended to replace advice given to you by your health care provider. Make sure you discuss any questions you have with your health care provider. Document Released: 03/20/2009 Document Revised: 10/30/2015 Document Reviewed: 06/28/2014 Elsevier Interactive Patient Education  2017 [employer].

## 2022-09-30 NOTE — Addendum Note (Signed)
Addended by: Barb Merino on: 09/30/2022 09:57 AM   Modules accepted: Orders

## 2022-10-01 ENCOUNTER — Telehealth: Payer: Self-pay

## 2022-10-01 NOTE — Telephone Encounter (Signed)
   Telephone encounter was:  Unsuccessful.  10/01/2022 Name: [name] MRN: [id number] DOB: [birth year]  Unsuccessful outbound call made today to assist with:  Transportation Needs   Outreach Attempt:  1st Attempt  No answer unable to leave message.  Markeria Goetsch [name]  [facility] Population Health Community Resource Care Guide   [email or web address]  ?? [phone]   Website: [email or web address]  [email or web address]

## 2022-10-04 ENCOUNTER — Telehealth: Payer: Self-pay

## 2022-10-04 NOTE — Telephone Encounter (Signed)
   Telephone encounter was:  Unsuccessful.  10/04/2022 Name: [name] MRN: [id number] DOB: Jan 16, 1961  Unsuccessful outbound call made today to assist with:  Transportation Needs   Outreach Attempt:  2nd Attempt  No answer unable to leave message.  [name] [name]  [facility] Population Health Community Resource Care Guide   [name]  ?? [phone]   Website: [email or web address]  [email or web address]

## 2022-10-05 ENCOUNTER — Ambulatory Visit: Payer: Self-pay | Admitting: Licensed Clinical Social Worker

## 2022-10-05 ENCOUNTER — Telehealth: Payer: Self-pay

## 2022-10-05 NOTE — Telephone Encounter (Signed)
   Telephone encounter was:  Successful.  10/05/2022 Name: [name] MRN: [id number] DOB: [birth year]  [name] is a 62 y.o. year old male who is a primary care patient of Marcine Matar, MD . The community resource team was consulted for assistance with Transportation Needs   Care guide performed the following interventions: Spoke with patient about [employer] transportation and also suggested using DoorDash and [employer] temporarily until things are settled with his auto insurance.  Follow Up Plan:  No further follow up planned at this time. The patient has been provided with needed resources.  [name] [name]  [facility] Population Health Community Resource Care Guide   [email or web address]  ?? [phone]   Website: [email or web address]  [email or web address]

## 2022-10-06 NOTE — Patient Instructions (Signed)
Visit Information  Thank you for taking time to visit with me today. Please don't hesitate to contact me if I can be of assistance to you.   Following are the goals we discussed today:   Goals Addressed             This Visit's Progress    Obtain Supportive Resources-Surgery Support   On track    Activities and task to complete in order to accomplish goals.   Keep all upcoming appointment discussed today Continue with compliance of taking medication prescribed by Doctor  Continue utilizing support system to assist with stress management Follow up with Nephrologist regarding wait list to discuss lab results and tx plan Utilize strategies discussed to assist with goals (weight loss/meal prep)                   Our next appointment is by telephone on 11/09/22 at 10 AM  Please call the care guide team at [phone] if you need to cancel or reschedule your appointment.   If you are experiencing a Mental Health or Behavioral Health Crisis or need someone to talk to, please call the Suicide and Crisis Lifeline: 988 call 911   Patient verbalizes understanding of instructions and care plan provided today and agrees to view in MyChart. Active MyChart status and patient understanding of how to access instructions and care plan via MyChart confirmed with patient.     Jenel Lucks, MSW, LCSW [facility] Care Management [place]  [facility] [email or web address] Phone [phone] 11:50 AM

## 2022-10-06 NOTE — Patient Outreach (Signed)
  Care Coordination   Follow Up Visit Note   10/05/2022 Name: [name] MRN: [id number] DOB: 1961-03-18  SEVAN MCBROOM is a 62 y.o. year old male who sees Marcine Matar, MD for primary care. I spoke with  [name] by phone today.  What matters to the patients health and wellness today?  Symptom Management/Transportation    Goals Addressed             This Visit's Progress    Obtain Supportive Resources-Surgery Support   On track    Activities and task to complete in order to accomplish goals.   Keep all upcoming appointment discussed today Continue with compliance of taking medication prescribed by Doctor  Continue utilizing support system to assist with stress management Follow up with Nephrologist regarding wait list to discuss lab results and tx plan Utilize strategies discussed to assist with goals (weight loss/meal prep)                   SDOH assessments and interventions completed:  No     Care Coordination Interventions:  Yes, provided  Interventions Today    Flowsheet Row Most Recent Value  Chronic Disease   Chronic disease during today's visit Hypertension (HTN), Diabetes, Chronic Kidney Disease/End Stage Renal Disease (ESRD)  General Interventions   General Interventions Discussed/Reviewed General Interventions Reviewed, Doctor Visits, Communication with  Doctor Visits Discussed/Reviewed Annual Wellness Visits  [Pt reviewed goals discussed at recent AWV. Discussed strategies to assist with completing goals]  Communication with --  [facility] collaborated with Care Guide, regarding transportation barriers]  Exercise Interventions   Exercise Discussed/Reviewed Physical Activity, Weight Managment  Mental Health Interventions   Mental Health Discussed/Reviewed Mental Health Reviewed, Coping Strategies  [LCSW provided info on SMART goals to promote completion of goals. Pt continues to engage in healthy coping skills to mitigate stress and  triggers]       Follow up plan: Follow up call scheduled for 2-4 weeks    Encounter Outcome:  Pt. Visit Completed   Jenel Lucks, MSW, LCSW [facility] Care Management [facility]  [facility] [email or web address] Phone [phone] 11:49 AM

## 2022-10-07 DIAGNOSIS — E7521 Fabry (-Anderson) disease: Secondary | ICD-10-CM | POA: Diagnosis not present

## 2022-10-12 DIAGNOSIS — I890 Lymphedema, not elsewhere classified: Secondary | ICD-10-CM | POA: Diagnosis not present

## 2022-10-12 DIAGNOSIS — I441 Atrioventricular block, second degree: Secondary | ICD-10-CM | POA: Diagnosis not present

## 2022-10-12 DIAGNOSIS — I129 Hypertensive chronic kidney disease with stage 1 through stage 4 chronic kidney disease, or unspecified chronic kidney disease: Secondary | ICD-10-CM | POA: Diagnosis not present

## 2022-10-12 DIAGNOSIS — N521 Erectile dysfunction due to diseases classified elsewhere: Secondary | ICD-10-CM | POA: Diagnosis not present

## 2022-10-12 DIAGNOSIS — M109 Gout, unspecified: Secondary | ICD-10-CM | POA: Diagnosis not present

## 2022-10-12 DIAGNOSIS — E118 Type 2 diabetes mellitus with unspecified complications: Secondary | ICD-10-CM | POA: Diagnosis not present

## 2022-10-12 DIAGNOSIS — E7521 Fabry (-Anderson) disease: Secondary | ICD-10-CM | POA: Diagnosis not present

## 2022-10-12 DIAGNOSIS — E349 Endocrine disorder, unspecified: Secondary | ICD-10-CM | POA: Diagnosis not present

## 2022-10-12 DIAGNOSIS — N1831 Chronic kidney disease, stage 3a: Secondary | ICD-10-CM | POA: Diagnosis not present

## 2022-10-12 DIAGNOSIS — I639 Cerebral infarction, unspecified: Secondary | ICD-10-CM | POA: Diagnosis not present

## 2022-10-13 LAB — LAB REPORT - SCANNED
Creatinine, POC: 57.3 mg/dL
EGFR: 39

## 2022-10-15 ENCOUNTER — Telehealth: Payer: Self-pay | Admitting: Licensed Clinical Social Worker

## 2022-10-15 ENCOUNTER — Telehealth: Payer: Self-pay | Admitting: Internal Medicine

## 2022-10-15 NOTE — Telephone Encounter (Signed)
Copied from CRM [id number]. Topic: General - Other >> Oct 15, 2022  2:26 PM [place] B wrote: Reason for CRM: pt called in wanting to speak with Jenel Lucks about having a getting his documents uploaded on this website,[email or web address]-[place].gov. He says it keeps saying he is blocked. Please call back for further assistance.

## 2022-10-18 ENCOUNTER — Telehealth: Payer: Self-pay | Admitting: Licensed Clinical Social Worker

## 2022-10-18 NOTE — Patient Outreach (Signed)
  Care Coordination   Follow Up Visit Note   10/15/2022 Name: [name] MRN: [id number] DOB: 1961/01/21  [name] is a 62 y.o. year old male who sees Marcine Matar, MD for primary care. I spoke with  [name] by phone today.  What matters to the patients health and wellness today?  Access [place]    Goals Addressed             This Visit's Progress    Obtain Supportive Resources   On track    Activities and task to complete in order to accomplish goals.   Keep all upcoming appointment discussed today Continue with compliance of taking medication prescribed by Doctor  Complete Part A of Access [place] application Utilize strategies discussed to assist with goals (weight loss/meal prep)                   SDOH assessments and interventions completed:  No     Care Coordination Interventions:  Yes, provided   Follow up plan: Follow up call scheduled for 2-4 weeks    Encounter Outcome:  Pt. Visit Completed   Jenel Lucks, MSW, LCSW [facility] Care Management [facility] Health  [facility] [email or web address] Phone [phone] 9:56 AM

## 2022-10-18 NOTE — Patient Instructions (Signed)
Visit Information  Thank you for taking time to visit with me today. Please don't hesitate to contact me if I can be of assistance to you.   Following are the goals we discussed today:   Goals Addressed             This Visit's Progress    Obtain Supportive Resources   On track    Activities and task to complete in order to accomplish goals.   Keep all upcoming appointment discussed today Continue with compliance of taking medication prescribed by Doctor  Complete Part A of [place] application Utilize strategies discussed to assist with goals (weight loss/meal prep)                   Our next appointment is by telephone on 6/4   Please call the care guide team at [phone] if you need to cancel or reschedule your appointment.   If you are experiencing a Mental Health or Behavioral Health Crisis or need someone to talk to, please call the Suicide and Crisis Lifeline: 988 call 911   Patient verbalizes understanding of instructions and care plan provided today and agrees to view in MyChart. Active MyChart status and patient understanding of how to access instructions and care plan via MyChart confirmed with patient.     Jenel Lucks, MSW, LCSW [facility] Care Management [place]  [facility] [email or web address] Phone [phone] 9:56 AM

## 2022-10-19 NOTE — Patient Instructions (Signed)
Visit Information  Thank you for taking time to visit with me today. Please don't hesitate to contact me if I can be of assistance to you.   Following are the goals we discussed today:   Goals Addressed             This Visit's Progress    Obtain Supportive Resources   On track    Activities and task to complete in order to accomplish goals.   Keep all upcoming appointment discussed today Continue with compliance of taking medication prescribed by Doctor  Complete Part A of [place] application Utilize strategies discussed to assist with goals (weight loss/meal prep)                   Our next appointment is by telephone on 6/4 at 10  Please call the care guide team at [phone] if you need to cancel or reschedule your appointment.   If you are experiencing a Mental Health or Behavioral Health Crisis or need someone to talk to, please call the Suicide and Crisis Lifeline: 988 call 911   Patient verbalizes understanding of instructions and care plan provided today and agrees to view in MyChart. Active MyChart status and patient understanding of how to access instructions and care plan via MyChart confirmed with patient.     Jenel Lucks, MSW, LCSW [name] Care Management [place]  [facility] [email or web address] Phone [phone] 6:51 PM

## 2022-10-19 NOTE — Patient Outreach (Signed)
  Care Coordination   Follow Up Visit Note   10/18/2022 Name: [name] MRN: [id number] DOB: [birth year]  [name] is a 62 y.o. year old male who sees Marcine Matar, MD for primary care. I spoke with  [name] by phone today.  What matters to the patients health and wellness today?  [place] application    Goals Addressed             This Visit's Progress    Obtain Supportive Resources   On track    Activities and task to complete in order to accomplish goals.   Keep all upcoming appointment discussed today Continue with compliance of taking medication prescribed by Doctor  Complete Part A of [place] application Utilize strategies discussed to assist with goals (weight loss/meal prep)                   SDOH assessments and interventions completed:  No     Care Coordination Interventions:  Yes, provided  Interventions Today    Flowsheet Row Most Recent Value  General Interventions   General Interventions Discussed/Reviewed General Interventions Reviewed  [Pt reported difficulty emailing [place] application. Mailed part A on 5/11. LCSW discussed community resources to assist with transporation]       Follow up plan: Follow up call scheduled for 2-4 weeks    Encounter Outcome:  Pt. Visit Completed   Jenel Lucks, MSW, LCSW [facility] Care Management [facility] Health  [facility] [email or web address] Phone [phone] 6:50 PM

## 2022-10-21 ENCOUNTER — Encounter: Payer: Self-pay | Admitting: Licensed Clinical Social Worker

## 2022-10-21 DIAGNOSIS — E7521 Fabry (-Anderson) disease: Secondary | ICD-10-CM | POA: Diagnosis not present

## 2022-10-21 NOTE — Patient Outreach (Signed)
  Care Coordination   Follow Up/Collab Visit Note   10/21/2022 Name: JESSE MADREN MRN: [id number] DOB: Nov 26, 1960  EMILEO SCHADLER is a 62 y.o. year old male who sees Marcine Matar, MD for primary care. I  completed Part B of application  What matters to the patients health and wellness today?  Patient was not engaged during this encounter     SDOH assessments and interventions completed:  No     Care Coordination Interventions:  Yes, provided  Interventions Today    Flowsheet Row Most Recent Value  General Interventions   General Interventions Discussed/Reviewed Community Resources  [LCSW completed Part B of [place] application]       Follow up plan: Follow up call scheduled for 2-4 weeks    Encounter Outcome:  Pt. Visit Completed   Jenel Lucks, MSW, LCSW [facility] Care Management [facility] Health  [facility] [email or web address] Phone [phone] 7:04 PM

## 2022-10-26 ENCOUNTER — Telehealth: Payer: Self-pay | Admitting: Neurology

## 2022-10-26 NOTE — Telephone Encounter (Signed)
I received office notes from [facility] 10/12/2022.  Mr. Rando has been doing well.  On Ozempic due to elevated A1c.  Remains on Fabrazyme every 2 weeks.  Not able to have Port-A-Cath due to not having personal care for 24 hours following the procedure.  Interested in oral formulation but his variant is not amendable for oral option.  Baseline creatinine 1.6-1.8.  Last A1c 9.7.  Recommend continue Fabrazyme every 2 weeks.  Tight BP control less than 130/80, A1c less than 7.  No medication changes.  Labs 10/12/2022 creatinine 1.92, sodium 149, ALT 49.

## 2022-10-28 NOTE — Progress Notes (Signed)
Remote pacemaker transmission.   

## 2022-11-03 ENCOUNTER — Other Ambulatory Visit: Payer: Self-pay

## 2022-11-04 DIAGNOSIS — E7521 Fabry (-Anderson) disease: Secondary | ICD-10-CM | POA: Diagnosis not present

## 2022-11-08 ENCOUNTER — Encounter: Payer: Self-pay | Admitting: Physician Assistant

## 2022-11-08 ENCOUNTER — Encounter: Payer: Self-pay | Admitting: Internal Medicine

## 2022-11-08 ENCOUNTER — Ambulatory Visit (INDEPENDENT_AMBULATORY_CARE_PROVIDER_SITE_OTHER): Payer: Medicare HMO | Admitting: Physician Assistant

## 2022-11-08 VITALS — BP 112/66 | HR 85 | Ht 70.0 in | Wt 271.0 lb

## 2022-11-08 DIAGNOSIS — R143 Flatulence: Secondary | ICD-10-CM | POA: Diagnosis not present

## 2022-11-08 NOTE — Patient Instructions (Signed)
Low Gas Producing Diet   _______________________________________________________  If your blood pressure at your visit was 140/90 or greater, please contact your primary care physician to follow up on this.  _______________________________________________________  If you are age 62 or older, your body mass index should be between 23-30. Your Body mass index is 38.88 kg/m. If this is out of the aforementioned range listed, please consider follow up with your Primary Care Provider.  If you are age 2 or younger, your body mass index should be between 19-25. Your Body mass index is 38.88 kg/m. If this is out of the aformentioned range listed, please consider follow up with your Primary Care Provider.   ________________________________________________________  The [facility] providers would like to encourage you to use [facility] to communicate with providers for non-urgent requests or questions.  Due to long hold times on the telephone, sending your provider a message by [facility] may be a faster and more efficient way to get a response.  Please allow 48 business hours for a response.  Please remember that this is for non-urgent requests.  _______________________________________________________   I appreciate the  opportunity to care for you  Thank You   Jacelyn Grip

## 2022-11-08 NOTE — Progress Notes (Signed)
Chief Complaint: IBS and constipation  HPI:    [name] is a 62 year old male with a past medical history as listed below including chronic diastolic CHF, CKD stage II, stroke on Plavix (10/20/2018 echo with LVEF 55-60%) and multiple others, previously known to Dr. Arlyce Dice, who was referred to me by Marcine Matar, MD for a complaint of IBS and constipation.      01/22/2014 colonoscopy with 1 flat polyp measuring 2 mm in the rectum and otherwise normal.  Pathology showed benign mucus and repeat recommended in 10 years.    06/25/2022 CBC normal, CMP with a glucose of 410 creatinine 2.26 and alk phos minimally elevated at 132.    Today, the patient presents to clinic and tells me that he has been have been having some issues over the past 2 years with increased flatulence.  He tells me this is not particularly smelly, but happens a lot of times when he is changing position or when he is urinating and then he will just pass gas.  Does tell me he saw a dietitian about 2 years ago who started him on more fibrous foods and he has noticed it since then.  Tells me he loves peppers with dinner and eats a lot of onions.  Otherwise denies any GI symptoms including abdominal pain or change in bowel habits.    Previously served on the [employer] for 20 years.    Denies fever, chills, weight loss, blood in his stool, nausea, vomiting, heartburn or reflux.  Past Medical History:  Diagnosis Date   Allergy [id number]   Dont know. 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 right worse   History of cellulitis of skin with lymphangitis LEFT LEG   HOCM (hypertrophic obstructive cardiomyopathy) (HCC)    a. Echo 11/16: Severe LVH, EF 55-60%, abnormal GLS consistent with HOCM, no SAM, mild LAE   Hypertension 20 years   Lymphedema of lower extremity LEFT  >  RIGHT   "using ankle high socks at home; pump doesn't work for me" (07/23/2014)   Pacemaker    02/12/11   Pneumonia 08/2011   Seasonal asthma NO INHALERS   30 years   Second degree Mobitz II AV block    with syncope, s/p PPM   Short of breath on exertion    Stroke (HCC) 10/23/2017   Type II diabetes mellitus (HCC) 2012   INSULIN DEPENDENT    Past Surgical History:  Procedure Laterality Date   CARDIAC CATHETERIZATION  03-12-2011  DR Verdis Prime   HYPERTROPHIC CARDIOMYOPATHY WITH LV CAVITY  APPEARANCE CONSISTANT WITH SIGNIFICANT APICAL HYPERTROPHY/ NORMAL LVSF / EF 55%/ EVIDENCE OF DIASTOLIC DYSFUNCTION WITH EDP OF 23-97mmHg AFTER A-WAVE/ NORMAL CORONARY ARTERIES   EVALUATION UNDER ANESTHESIA WITH FISTULECTOMY N/A 06/23/2015   Procedure: EXAM UNDER ANESTHESIA WITH POSSIBLE FISTULOTOMY;  Surgeon: Jimmye Norman, MD;  Location: [facility] OR;  Service: General;  Laterality: N/A;  HERNIA REPAIR  06/08/2011   Umbilical Hernia Repair   INCISION AND DRAINAGE PERIRECTAL ABSCESS Left 07/29/2014   Procedure: IRRIGATION AND DEBRIDEMENT PERIRECTAL ABSCESS;  Surgeon: Frederik Schmidt, MD;  Location: [facility] OR;  Service: General;  Laterality: Left;  Prone position   INSERT / REPLACE / REMOVE PACEMAKER  02/12/2011   SJM implanted by Dr Johney Frame for Mobitz II second degree AV block and syncope   IRRIGATION AND DEBRIDEMENT ABSCESS N/A 07/27/2013   Procedure: IRRIGATION AND DEBRIDEMENT OF SKIN, SOFT TISSUE AND MUSCLES OF UPPER BACK (11X19X4cm) WITH 10 BLADE AND PULSATILE LAVAGE ;  Surgeon: Atilano Ina, MD;  Location: [facility] OR;  Service: General;  Laterality: N/A;   PPM GENERATOR CHANGEOUT  N/A 05/22/2019   Procedure: PPM GENERATOR CHANGEOUT;  Surgeon: Hillis Range, MD;  Location: [facility] INVASIVE CV LAB;  Service: Cardiovascular;  Laterality: N/A;   UMBILICAL HERNIA REPAIR  03/22/2012   Procedure: HERNIA REPAIR UMBILICAL ADULT;  Surgeon: Romie Levee, MD;  Location: [facility] ORS;  Service: General;  Laterality: N/A;  Umbilical Hernia Repair     Current Outpatient Medications  Medication Sig Dispense Refill   Accu-Chek FastClix Lancets MISC 1 each by Other route 3 (three) times daily. 102 each 12   acetaminophen (TYLENOL) 325 MG tablet Take 1-2 tablets (325-650 mg total) by mouth every 4 (four) hours as needed for mild pain.     Agalsidase beta (FABRAZYME IV) Inject 115 mg into the vein every 14 (fourteen) days.      albuterol (PROAIR HFA) 108 (90 Base) MCG/ACT inhaler Inhale 1 puff into the lungs every 6 (six) hours as needed for wheezing or shortness of breath. 8.5 each 1   allopurinol (ZYLOPRIM) 100 MG tablet TAKE 3 TABLETS BY MOUTH EVERY DAY 270 tablet 1   amLODipine (NORVASC) 10 MG tablet Take 1 tablet (10 mg total) by mouth daily. 90 tablet 1   atorvastatin (LIPITOR) 80 MG tablet TAKE 1 TABLET BY MOUTH DAILY AT 6 PM. 90 tablet 2   clopidogrel (PLAVIX) 75 MG tablet Take 1 tablet (75 mg total) by mouth daily. 90 tablet 1   Continuous Blood Gluc Receiver (FREESTYLE LIBRE 2 READER) DEVI UAD (Patient not taking: Reported on 09/30/2022) 1 each 0   Continuous Blood Gluc Sensor (FREESTYLE LIBRE SENSOR SYSTEM) MISC Change sensor Q 2 wks (Patient not taking: Reported on 09/30/2022) 2 each 12   doxazosin (CARDURA) 2 MG tablet Take 0.5 tablets (1 mg total) by mouth daily. 45 tablet 1   fexofenadine (ALLEGRA) 180 MG tablet Take 180 mg by mouth every 14 (fourteen) days.     fluticasone (FLONASE) 50 MCG/ACT nasal spray PLACE 1 SPRAY INTO BOTH NOSTRILS DAILY AS NEEDED FOR ALLERGIES. 48 mL 1   furosemide (LASIX) 20 MG tablet Take 1 tablet (20 mg total) by mouth daily. 90 tablet 1   glucose blood  (ONETOUCH VERIO) test strip Use to check blood sugar three times daily. 100 each 2   insulin aspart protamine - aspart (NOVOLOG MIX 70/30 FLEXPEN) (70-30) 100 UNIT/ML FlexPen Inject 28 Units into the skin 2 (two) times daily with a meal. 15 mL 6   Insulin Pen Needle (PX SHORTLENGTH PEN NEEDLES) 31G X 8 MM MISC 1 each by Does not apply route 2 (two) times daily before a meal. 100 each 12   mupirocin ointment (BACTROBAN) 2 % Apply to affected area daily with dressing change. 30 g 0   Prednisol Ace-Moxiflox-Bromfen 1-0.5-0.075 % SUSP Place 1 drop into the right eye 4 (  four) times daily. Start 1 day before the surgery on 10/19/21 (Patient not taking: Reported on 08/24/2022)     Semaglutide, 1 MG/DOSE, 4 MG/3ML SOPN Inject 1 mg as directed once a week. Begin once you complete the 0.25mg  and 0.5mg  doses. Continue the 1mg  weekly dose thereafter. (Patient not taking: Reported on 09/30/2022) 3 mL 3   Semaglutide,0.25 or 0.5MG /DOS, (OZEMPIC, 0.25 OR 0.5 MG/DOSE,) 2 MG/3ML SOPN Inject 0.25 mg into the skin every 7 (seven) days. (Patient not taking: Reported on 09/30/2022) 3 mL 1   Semaglutide,0.25 or 0.5MG /DOS, (OZEMPIC, 0.25 OR 0.5 MG/DOSE,) 2 MG/3ML SOPN Inject 0.5 mg into the skin once weekly for 4 weeks. Once you complete 4 injections, start the next dose (1mg  pen). 3 mL 0   valsartan-hydrochlorothiazide (DIOVAN-HCT) 80-12.5 MG tablet Take 1 tablet by mouth daily. 90 tablet 1   No current facility-administered medications for this visit.    Allergies as of 11/08/2022 - Review Complete 09/30/2022  Allergen Reaction Noted   Lisinopril Anaphylaxis 08/11/2017   Shellfish allergy Hives and Swelling 08/09/2011    Family History  Problem Relation Age of Onset   Stroke Mother    Fabry's disease Mother    Fabry's disease Brother    Colon cancer Neg Hx     Social History   Socioeconomic History   Marital status: Divorced    Spouse name: Not on file   Number of children: Not on file   Years of education:  Not on file   Highest education level: Not on file  Occupational History   Not on file  Tobacco Use   Smoking status: Never   Smokeless tobacco: Never   Tobacco comments:    Never smoked. Tobacco or cannabis  Vaping Use   Vaping Use: Never used  Substance and Sexual Activity   Alcohol use: Yes    Comment: 07/23/2014 "might have a few drinks on holidays or at cookouts"   Drug use: No   Sexual activity: Not Currently  Other Topics Concern   Not on file  Social History Narrative   Coaches [employer]    Social Determinants of Health   Financial Resource Strain: Low Risk  (09/26/2022)   Overall Financial Resource Strain (CARDIA)    Difficulty of Paying Living Expenses: Not hard at all  Food Insecurity: No Food Insecurity (09/26/2022)   Hunger Vital Sign    Worried About Running Out of Food in the Last Year: Never true    Ran Out of Food in the Last Year: Never true  Transportation Needs: No Transportation Needs (10/05/2022)   PRAPARE - Administrator, Civil Service (Medical): No    Lack of Transportation (Non-Medical): No  Physical Activity: Insufficiently Active (09/26/2022)   Exercise Vital Sign    Days of Exercise per Week: 3 days    Minutes of Exercise per Session: 30 min  Stress: No Stress Concern Present (09/26/2022)   [employer] of Occupational Health - Occupational Stress Questionnaire    Feeling of Stress : Not at all  Social Connections: Unknown (09/26/2022)   Social Connection and Isolation Panel [NHANES]    Frequency of Communication with Friends and Family: More than three times a week    Frequency of Social Gatherings with Friends and Family: Patient declined    Attends Religious Services: Not on Insurance claims handler of Clubs or Organizations: No    Attends Banker Meetings: Never    Marital Status: Divorced  Intimate  Partner Violence: Not At Risk (10/19/2018)   Humiliation, Afraid, Rape, and Kick questionnaire    Fear of  Current or Ex-Partner: No    Emotionally Abused: No    Physically Abused: No    Sexually Abused: No    Review of Systems:    Constitutional: No weight loss, fever or chills Skin: No rash  Cardiovascular: No chest pain  Respiratory: No SOB Gastrointestinal: See HPI and otherwise negative Genitourinary: No dysuria  Neurological: No headache, dizziness or syncope Musculoskeletal: No new muscle or joint pain Hematologic: No bleeding  Psychiatric: No history of depression or anxiety   Physical Exam:  Vital signs: BP 112/66   Pulse 85   Ht 5\' 10"  (1.778 m)   Wt 271 lb (122.9 kg)   BMI 38.88 kg/m    Constitutional:   Pleasant AA male appears to be in NAD, Well developed, Well nourished, alert and cooperative Head:  Normocephalic and atraumatic. Eyes:   PEERL, EOMI. No icterus. Conjunctiva pink. Ears:  Normal auditory acuity. Neck:  Supple Throat: Oral cavity and pharynx without inflammation, swelling or lesion.  Respiratory: Respirations even and unlabored. Lungs clear to auscultation bilaterally.   No wheezes, crackles, or rhonchi.  Cardiovascular: Normal S1, S2. No MRG. Regular rate and rhythm. No peripheral edema, cyanosis or pallor.  Gastrointestinal:  Soft, nondistended, nontender. No rebound or guarding. Increased BS upper quadrants, No appreciable masses or hepatomegaly. Rectal:  Not performed.  Msk:  Symmetrical without gross deformities. Without edema, no deformity or joint abnormality.  Neurologic:  Alert and  oriented x4;  grossly normal neurologically.  Skin:   Dry and intact without significant lesions or rashes. Psychiatric: Demonstrates good judgement and reason without abnormal affect or behaviors.  RELEVANT LABS AND IMAGING: CBC    Component Value Date/Time   WBC 7.3 06/25/2022 1532   WBC 7.0 05/22/2019 1201   RBC 5.21 06/25/2022 1532   RBC 5.29 05/22/2019 1201   HGB 15.3 06/25/2022 1532   HCT 45.4 06/25/2022 1532   PLT 178 06/25/2022 1532   MCV 87  06/25/2022 1532   MCH 29.4 06/25/2022 1532   MCH 29.3 05/22/2019 1201   MCHC 33.7 06/25/2022 1532   MCHC 32.1 05/22/2019 1201   RDW 12.1 06/25/2022 1532   LYMPHSABS 2.9 08/23/2019 1152   MONOABS 1.3 (H) 10/24/2018 0515   EOSABS 0.3 08/23/2019 1152   BASOSABS 0.1 08/23/2019 1152    CMP     Component Value Date/Time   NA 140 06/25/2022 1532   K 4.5 06/25/2022 1532   CL 100 06/25/2022 1532   CO2 22 06/25/2022 1532   GLUCOSE 410 (H) 06/25/2022 1532   GLUCOSE 133 (H) 05/22/2019 1321   BUN 30 (H) 06/25/2022 1532   CREATININE 2.26 (H) 06/25/2022 1532   CREATININE 1.28 11/07/2014 0910   CALCIUM 9.6 06/25/2022 1532   PROT 7.5 06/25/2022 1532   ALBUMIN 3.9 06/25/2022 1532   AST 33 06/25/2022 1532   ALT 29 06/25/2022 1532   ALKPHOS 132 (H) 06/25/2022 1532   BILITOT 0.9 06/25/2022 1532   GFRNONAA 36 (L) 07/08/2020 1048   GFRNONAA 63 11/07/2014 0910   GFRAA 42 (L) 07/08/2020 1048   GFRAA 73 11/07/2014 0910    Assessment: 1.  Flatulence/excessive gas: Ever since the change to healthier diet with more fibrous foods over 2 years ago, no abdominal pain or other concerning GI symptoms, no CPAP use; likely diet related versus less likely SIBO 2.  Screening for colorectal cancer: Patient is due  01/23/2024 for repeat colonoscopy  Plan: 1.  Discussed options with the patient today including conservative measures and a low gas producing diet handout and changing his diet to see if it helps.  The other option is to test for SIBO.  He would like to wait on testing.  He will try and follow some of the diet recommendations to see if it helps. 2.  Patient is due for his next colonoscopy in August 2025. 3.  Patient to follow in clinic as needed.  He will call if he would like SIBO testing in the future.  Assigned to Dr. Chales Abrahams today.  Hyacinth Meeker, PA-C [facility] 11/08/2022, 11:00 AM  Cc: Marcine Matar, MD

## 2022-11-09 ENCOUNTER — Encounter: Payer: Self-pay | Admitting: Internal Medicine

## 2022-11-09 ENCOUNTER — Ambulatory Visit: Payer: Self-pay | Admitting: Licensed Clinical Social Worker

## 2022-11-10 ENCOUNTER — Other Ambulatory Visit: Payer: Self-pay | Admitting: Internal Medicine

## 2022-11-10 DIAGNOSIS — E7521 Fabry (-Anderson) disease: Secondary | ICD-10-CM

## 2022-11-10 NOTE — Patient Outreach (Signed)
  Care Coordination   Follow Up Visit Note   11/09/2022 Name: [name] MRN: [id number] DOB: 11/13/1960  GARYSON BORGMANN is a 62 y.o. year old male who sees Marcine Matar, MD for primary care. I spoke with  [name] by phone today.  What matters to the patients health and wellness today?  Symptom Management    Goals Addressed             This Visit's Progress    Obtain Supportive Resources   On track    Activities and task to complete in order to accomplish goals.   Keep all upcoming appointment discussed today Continue with compliance of taking medication prescribed by Doctor  F/up with [facility] about application approval in approx 2-3 weeks Utilize strategies discussed to assist with goals (weight loss/meal prep)                    SDOH assessments and interventions completed:  No     Care Coordination Interventions:  Yes, provided  Interventions Today    Flowsheet Row Most Recent Value  Chronic Disease   Chronic disease during today's visit Diabetes, Hypertension (HTN), Chronic Kidney Disease/End Stage Renal Disease (ESRD)  General Interventions   General Interventions Discussed/Reviewed General Interventions Reviewed, Doctor Visits  Doctor Visits Discussed/Reviewed Specialist  [Pt reviewed recent specialist appt. Will speak with PCP about referral to Geneticist]  Mental Health Interventions   Mental Health Discussed/Reviewed Mental Health Reviewed, Coping Strategies  [Validation and encouragement provided. Pt encouraged to make small goals to assist with management of symptoms]  Nutrition Interventions   Nutrition Discussed/Reviewed Nutrition Reviewed  [Pt was provided listing of foods to avoid to assist with decrease of GI symptoms. LCSW encouraged pt to f/up with Nutritionist with any additional questions/concerns]       Follow up plan: Follow up call scheduled for 4-6 weeks    Encounter Outcome:  Pt. Visit Completed   Jenel Lucks, MSW, LCSW [facility] Care Management [facility]  [facility] [email or web address] Phone [phone] 5:10 PM

## 2022-11-10 NOTE — Patient Instructions (Signed)
Visit Information  Thank you for taking time to visit with me today. Please don't hesitate to contact me if I can be of assistance to you.   Following are the goals we discussed today:   Goals Addressed             This Visit's Progress    Obtain Supportive Resources   On track    Activities and task to complete in order to accomplish goals.   Keep all upcoming appointment discussed today Continue with compliance of taking medication prescribed by Doctor  F/up with [facility] about application approval in approx 2-3 weeks Utilize strategies discussed to assist with goals (weight loss/meal prep)                    Our next appointment is by telephone on 7/2 at 10 AM  Please call the care guide team at [phone] if you need to cancel or reschedule your appointment.   If you are experiencing a Mental Health or Behavioral Health Crisis or need someone to talk to, please call the Suicide and Crisis Lifeline: 988 call 911   Patient verbalizes understanding of instructions and care plan provided today and agrees to view in MyChart. Active MyChart status and patient understanding of how to access instructions and care plan via MyChart confirmed with patient.     Jenel Lucks, MSW, LCSW [facility] Care Management [place]  [facility] [email or web address] Phone [phone] 5:10 PM

## 2022-11-16 NOTE — Progress Notes (Signed)
Agree with assessment/plan.  Raj Arinze Rivadeneira, MD [facility] [phone]  

## 2022-11-17 ENCOUNTER — Ambulatory Visit (INDEPENDENT_AMBULATORY_CARE_PROVIDER_SITE_OTHER): Payer: Medicare HMO | Admitting: Neurology

## 2022-11-17 ENCOUNTER — Encounter: Payer: Self-pay | Admitting: Neurology

## 2022-11-17 VITALS — BP 130/77 | HR 64 | Ht 70.0 in | Wt 269.4 lb

## 2022-11-17 DIAGNOSIS — E7521 Fabry (-Anderson) disease: Secondary | ICD-10-CM | POA: Diagnosis not present

## 2022-11-17 DIAGNOSIS — I6302 Cerebral infarction due to thrombosis of basilar artery: Secondary | ICD-10-CM

## 2022-11-17 NOTE — Patient Instructions (Signed)
Continue Plavix for secondary stroke prevention   Strict management of vascular risk factors with a goal BP less than 130/90, A1c less than 7.0, LDL less than 70 for secondary stroke prevention  Keep close follow up with PCP, return here for new or worsening symptoms
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
## 2022-11-19 ENCOUNTER — Other Ambulatory Visit: Payer: Self-pay

## 2022-11-19 DIAGNOSIS — E7521 Fabry (-Anderson) disease: Secondary | ICD-10-CM | POA: Diagnosis not present

## 2022-11-23 ENCOUNTER — Encounter: Payer: Self-pay | Admitting: Internal Medicine

## 2022-12-03 DIAGNOSIS — E7521 Fabry (-Anderson) disease: Secondary | ICD-10-CM | POA: Diagnosis not present

## 2022-12-07 ENCOUNTER — Ambulatory Visit: Payer: Self-pay | Admitting: Licensed Clinical Social Worker

## 2022-12-07 NOTE — Patient Instructions (Signed)
Visit Information  Thank you for taking time to visit with me today. Please don't hesitate to contact me if I can be of assistance to you.   Following are the goals we discussed today:   Goals Addressed             This Visit's Progress    Obtain Supportive Resources   On track    Activities and task to complete in order to accomplish goals.   Keep all upcoming appointment discussed today Continue with compliance of taking medication prescribed by Doctor  F/up with [facility] [place] about application, which was submitted over 4 weeks ago Utilize strategies discussed to assist with goals (weight loss/meal prep)  Continue utilizing healthy coping skills to promote self-care                    Our next appointment is by telephone on 08/05 at 10 AM  Please call the care guide team at [phone] if you need to cancel or reschedule your appointment.   If you are experiencing a Mental Health or Behavioral Health Crisis or need someone to talk to, please call the Suicide and Crisis Lifeline: 988 call 911   Patient verbalizes understanding of instructions and care plan provided today and agrees to view in MyChart. Active MyChart status and patient understanding of how to access instructions and care plan via MyChart confirmed with patient.     Jenel Lucks, MSW, LCSW [facility] Care Management [place]  [facility] [email or web address] Phone [phone] 1:32 PM

## 2022-12-07 NOTE — Patient Outreach (Signed)
  Care Coordination   Follow Up Visit Note   12/07/2022 Name: JAQUIN ZAMIR MRN: [id number] DOB: 1961/02/20  [name] is a 62 y.o. year old male who sees Marcine Matar, MD for primary care. I spoke with  [name] by phone today.  What matters to the patients health and wellness today?  Symptom Management and Transportation    Goals Addressed             This Visit's Progress    Obtain Supportive Resources   On track    Activities and task to complete in order to accomplish goals.   Keep all upcoming appointment discussed today Continue with compliance of taking medication prescribed by Doctor  F/up with [facility] [place] about application, which was submitted over 4 weeks ago Utilize strategies discussed to assist with goals (weight loss/meal prep)  Continue utilizing healthy coping skills to promote self-care                    SDOH assessments and interventions completed:  No     Care Coordination Interventions:  Yes, provided  Interventions Today    Flowsheet Row Most Recent Value  Chronic Disease   Chronic disease during today's visit Other, Hypertension (HTN), Chronic Kidney Disease/End Stage Renal Disease (ESRD), Diabetes  [Fabry Disease]  General Interventions   General Interventions Discussed/Reviewed General Interventions Reviewed, Doctor Visits, [facility] has decided to forego second opinion and will schedule port surgery noting 24 hr care will be provided. No update on [place] application, pt will f/up]  Doctor Visits Discussed/Reviewed Doctor Visits Reviewed  Mental Health Interventions   Mental Health Discussed/Reviewed Mental Health Reviewed, Coping Strategies  [Pt continues to utilize healthy coping skills. Plans to visit family this month.]  Nutrition Interventions   Nutrition Discussed/Reviewed Nutrition Reviewed  Pharmacy Interventions   Pharmacy Dicussed/Reviewed Pharmacy Topics Reviewed  Safety Interventions    Safety Discussed/Reviewed Safety Reviewed       Follow up plan: Follow up call scheduled for 08/05    Encounter Outcome:  Pt. Visit Completed   Jenel Lucks, MSW, LCSW [facility] Care Management [facility]  [email or web address] Phone [phone] 1:31 PM

## 2022-12-13 ENCOUNTER — Other Ambulatory Visit (HOSPITAL_COMMUNITY): Payer: Self-pay | Admitting: Nephrology

## 2022-12-13 ENCOUNTER — Telehealth (HOSPITAL_COMMUNITY): Payer: Self-pay

## 2022-12-13 DIAGNOSIS — E7521 Fabry (-Anderson) disease: Secondary | ICD-10-CM

## 2022-12-13 NOTE — Telephone Encounter (Signed)
Called to schedule port placement. Pt will need a driver and doesn't have anyone to take him home. He was under the impression that the ordering physician had this taken care of. He will call over to their office and call back to schedule. AB

## 2022-12-13 NOTE — Telephone Encounter (Signed)
Called to schedule port placement, no answer, left vm. AB  

## 2022-12-14 ENCOUNTER — Other Ambulatory Visit: Payer: Self-pay | Admitting: Pharmacist

## 2022-12-14 ENCOUNTER — Other Ambulatory Visit: Payer: Self-pay

## 2022-12-14 MED ORDER — SEMAGLUTIDE (1 MG/DOSE) 4 MG/3ML ~~LOC~~ SOPN
1.0000 mg | PEN_INJECTOR | SUBCUTANEOUS | 1 refills | Status: DC
  Filled 2022-12-14: qty 3, 28d supply, fill #0

## 2022-12-15 ENCOUNTER — Other Ambulatory Visit (HOSPITAL_BASED_OUTPATIENT_CLINIC_OR_DEPARTMENT_OTHER): Payer: Medicare HMO | Admitting: Pharmacist

## 2022-12-15 DIAGNOSIS — Z79899 Other long term (current) drug therapy: Secondary | ICD-10-CM

## 2022-12-15 NOTE — Progress Notes (Signed)
Pharmacy Quality Measure Review  This patient is appearing on a report for being at risk of failing the adherence measure for hypertension (ACEi/ARB) medications this calendar year.   Medication: valsartan-hydrochlorothiazide 80-12.5 mg daily Last fill date: 10/22/2022 for 90 day supply. Fills now look up-to-date. He likely is showing up as high-risk as my report did not capture his 10/2022 fill date. It is still showing he last filled 07/08/22 which is not true. No further follow-up needed at this time.   Butch Penny, PharmD, P[place]y [place], CPP Clinical Pharmacist [facility] [phone]

## 2022-12-17 ENCOUNTER — Other Ambulatory Visit: Payer: Self-pay

## 2022-12-17 DIAGNOSIS — E7521 Fabry (-Anderson) disease: Secondary | ICD-10-CM | POA: Diagnosis not present

## 2022-12-20 ENCOUNTER — Ambulatory Visit (INDEPENDENT_AMBULATORY_CARE_PROVIDER_SITE_OTHER): Payer: Medicare HMO

## 2022-12-20 DIAGNOSIS — I441 Atrioventricular block, second degree: Secondary | ICD-10-CM

## 2022-12-21 ENCOUNTER — Other Ambulatory Visit: Payer: Self-pay

## 2022-12-22 LAB — CUP PACEART REMOTE DEVICE CHECK
Battery Remaining Longevity: 44 mo
Battery Remaining Percentage: 52 %
Battery Voltage: 2.98 V
Brady Statistic AP VP Percent: 78 %
Brady Statistic AP VS Percent: 1 %
Brady Statistic AS VP Percent: 21 %
Brady Statistic AS VS Percent: 1 %
Brady Statistic RA Percent Paced: 78 %
Brady Statistic RV Percent Paced: 99 %
Date Time Interrogation Session: 20240715034722
Implantable Lead Connection Status: 753985
Implantable Lead Connection Status: 753985
Implantable Lead Implant Date: 20120907
Implantable Lead Implant Date: 20120907
Implantable Lead Location: 753859
Implantable Lead Location: 753860
Implantable Pulse Generator Implant Date: 20201215
Lead Channel Impedance Value: 310 Ohm
Lead Channel Impedance Value: 400 Ohm
Lead Channel Pacing Threshold Amplitude: 0.75 V
Lead Channel Pacing Threshold Amplitude: 2.125 V
Lead Channel Pacing Threshold Pulse Width: 0.5 ms
Lead Channel Pacing Threshold Pulse Width: 0.8 ms
Lead Channel Sensing Intrinsic Amplitude: 5 mV
Lead Channel Sensing Intrinsic Amplitude: 9.2 mV
Lead Channel Setting Pacing Amplitude: 1.75 V
Lead Channel Setting Pacing Amplitude: 2.375
Lead Channel Setting Pacing Pulse Width: 0.8 ms
Lead Channel Setting Sensing Sensitivity: 4 mV
Pulse Gen Model: 2272
Pulse Gen Serial Number: 9187005

## 2022-12-24 ENCOUNTER — Ambulatory Visit: Payer: Medicare HMO | Admitting: Internal Medicine

## 2022-12-27 ENCOUNTER — Encounter: Payer: Self-pay | Admitting: Internal Medicine

## 2022-12-27 ENCOUNTER — Telehealth: Payer: Self-pay | Admitting: Pharmacist

## 2022-12-27 ENCOUNTER — Ambulatory Visit: Payer: Medicare HMO | Attending: Internal Medicine | Admitting: Internal Medicine

## 2022-12-27 ENCOUNTER — Other Ambulatory Visit: Payer: Self-pay | Admitting: Internal Medicine

## 2022-12-27 ENCOUNTER — Other Ambulatory Visit: Payer: Self-pay

## 2022-12-27 VITALS — BP 129/79 | HR 60 | Temp 97.9°F | Ht 70.0 in | Wt 264.0 lb

## 2022-12-27 DIAGNOSIS — E1159 Type 2 diabetes mellitus with other circulatory complications: Secondary | ICD-10-CM | POA: Diagnosis not present

## 2022-12-27 DIAGNOSIS — I152 Hypertension secondary to endocrine disorders: Secondary | ICD-10-CM | POA: Diagnosis not present

## 2022-12-27 DIAGNOSIS — Z7985 Long-term (current) use of injectable non-insulin antidiabetic drugs: Secondary | ICD-10-CM

## 2022-12-27 DIAGNOSIS — Z23 Encounter for immunization: Secondary | ICD-10-CM

## 2022-12-27 DIAGNOSIS — E669 Obesity, unspecified: Secondary | ICD-10-CM

## 2022-12-27 DIAGNOSIS — E1169 Type 2 diabetes mellitus with other specified complication: Secondary | ICD-10-CM

## 2022-12-27 DIAGNOSIS — E7521 Fabry (-Anderson) disease: Secondary | ICD-10-CM

## 2022-12-27 DIAGNOSIS — Z6837 Body mass index (BMI) 37.0-37.9, adult: Secondary | ICD-10-CM

## 2022-12-27 DIAGNOSIS — Z794 Long term (current) use of insulin: Secondary | ICD-10-CM

## 2022-12-27 DIAGNOSIS — N1832 Chronic kidney disease, stage 3b: Secondary | ICD-10-CM

## 2022-12-27 LAB — POCT GLYCOSYLATED HEMOGLOBIN (HGB A1C): HbA1c, POC (controlled diabetic range): 6.6 % (ref 0.0–7.0)

## 2022-12-27 LAB — GLUCOSE, POCT (MANUAL RESULT ENTRY): POC Glucose: 137 mg/dl — AB (ref 70–99)

## 2022-12-27 MED ORDER — SEMAGLUTIDE (1 MG/DOSE) 4 MG/3ML ~~LOC~~ SOPN: 1.0000 mg | PEN_INJECTOR | SUBCUTANEOUS | Status: DC

## 2022-12-27 MED ORDER — OZEMPIC (0.25 OR 0.5 MG/DOSE) 2 MG/3ML ~~LOC~~ SOPN
0.5000 mg | PEN_INJECTOR | SUBCUTANEOUS | 1 refills | Status: DC
Start: 2022-12-27 — End: 2023-04-29
  Filled 2022-12-27 – 2023-01-04 (×3): qty 3, 28d supply, fill #0

## 2022-12-27 NOTE — Addendum Note (Signed)
Addended by: Jonah Blue B on: 12/27/2022 08:55 PM   Modules accepted: Orders

## 2022-12-27 NOTE — Progress Notes (Addendum)
[name], male    DOB: 04-05-1961  MRN: [id number]  CC: Diabetes (DM f/u. /No questions / concerns Valentino Hue to Tdap vax, )   Subjective: [name] is a 62 y.o. male who presents for chronic ds management His concerns today include:  Patient with history of diabetes type 2 with microalbuminuria, diastolic CHF with pacemaker (secconary to AV block with syncopy), CKD stage III, HOCM, HTN, HL, CVA, lymphedema. 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     HTN/dCHF:  compliance with amlodipine 10 mg daily, doxazosin 2 mg half a tablet daily, furosemide 20 mg daily, Diovan/HCTZ 80/12.5 mg daily  Checks BP intermittently.  Reports good readings.   DM/Obesity: Results for orders placed or performed in visit on 12/27/22  POCT glucose (manual entry)  Result Value Ref Range   POC Glucose 137 (A) 70 - 99 mg/dl  POCT glycosylated hemoglobin (Hb A1C)  Result Value Ref Range   Hemoglobin A1C     HbA1c POC (<> result, manual entry)     HbA1c, POC (prediabetic range)     HbA1c, POC (controlled diabetic range) 6.6 0.0 - 7.0 %   *Note: Due to a large number of results and/or encounters for the requested time period, some results have not been displayed. A complete set of results can be found in Results Review.  Patient on NovoLog 70/30 24 units twice a day and should be on Ozempic 1 mg once a wk.  Started on 0.25 mg then was increased to 0.5 mg. However, pt states pens he has been getting recently is the o.25 mg that can not dial up to 0.5 mg.  So he has been on 0.25 mg once a wk.  Tolerating the Ozempic.  Weight is down 13 pounds since I saw him in March. Still has not receive the [facility] reader but has sensors. Told it is needing prior approval; Checks BS before BF and dinner.  Range 110-125.  No lows.    CKD 3b/Fabry's:  Still followed by Dr. Edmon Crape.  Reports being told that he did not meet criteria for oral Farazyme.  Reports nephrologist is  working on getting him a port placement by IR and over night monitoring after placement   HL/CVA:  saw neurologist Patient Active Problem List   Diagnosis Date Noted   Tetanus, diphtheria, and acellular pertussis (Tdap) vaccination declined 08/24/2022   Atrial flutter, unspecified type (HCC) 06/26/2022   Post-nasal drip 10/26/2021   Chronic sinusitis 07/01/2021   Contact dermatitis and other eczema 07/01/2021   Edema 07/01/2021   Other and unspecified hyperlipidemia 07/01/2021   Proteinuria 07/01/2021   Macroalbuminuric diabetic nephropathy (HCC) 11/08/2020   Diabetes mellitus (HCC) 11/07/2020   Hypertension associated with stage 3a chronic kidney disease due to type 2 diabetes mellitus (HCC) 11/07/2020   Influenza vaccine needed 07/08/2020   Spinal stenosis of lumbar region without neurogenic claudication 07/08/2020   Degenerative spondylolisthesis 05/20/2020   Acute left-sided low back pain with left-sided sciatica 05/08/2020   Reactive airway disease 01/24/2020   Pain due to onychomycosis of toenails of both feet 10/17/2019   Morbid obesity (HCC) 08/23/2019   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   CVA (cerebral vascular accident) (HCC) 10/20/2018   UTI (urinary tract infection) 10/19/2018   Upper airway cough syndrome 08/01/2018   Lymphedema 10/06/2016   Gout, arthritis    Anorectal fistula 06/23/2015   Onychomycosis of toenail 09/26/2014   Diabetes mellitus type 2 in  obese    Iron deficiency anemia 07/29/2014   CKD (chronic kidney disease), stage III (HCC)    Chronic diastolic HF (heart failure) (HCC) 03/27/2013   Peripheral edema 07/20/2012   Obstructive sleep apnea, suspected 06/21/2012   Pacemaker-St.Jude 02/23/2012   Hypertension 08/09/2011   Mobitz type II atrioventricular block 05/19/2011   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HCC) 05/19/2011     Current Outpatient Medications on File Prior to Visit  Medication Sig Dispense Refill   Accu-Chek FastClix Lancets MISC 1 each by Other  route 3 (three) times daily. 102 each 12   acetaminophen (TYLENOL) 325 MG tablet Take 1-2 tablets (325-650 mg total) by mouth every 4 (four) hours as needed for mild pain.     Agalsidase beta (FABRAZYME IV) Inject 115 mg into the vein every 14 (fourteen) days.      albuterol (PROAIR HFA) 108 (90 Base) MCG/ACT inhaler Inhale 1 puff into the lungs every 6 (six) hours as needed for wheezing or shortness of breath. 8.5 each 1   allopurinol (ZYLOPRIM) 100 MG tablet TAKE 3 TABLETS BY MOUTH EVERY DAY 270 tablet 1   amLODipine (NORVASC) 10 MG tablet Take 1 tablet (10 mg total) by mouth daily. 90 tablet 1   atorvastatin (LIPITOR) 80 MG tablet TAKE 1 TABLET BY MOUTH DAILY AT 6 PM. 90 tablet 2   clopidogrel (PLAVIX) 75 MG tablet Take 1 tablet (75 mg total) by mouth daily. 90 tablet 1   Continuous Blood Gluc Receiver (FREESTYLE LIBRE 2 READER) DEVI UAD 1 each 0   Continuous Blood Gluc Sensor (FREESTYLE LIBRE SENSOR SYSTEM) MISC Change sensor Q 2 wks 2 each 12   doxazosin (CARDURA) 2 MG tablet Take 0.5 tablets (1 mg total) by mouth daily. 45 tablet 1   fexofenadine (ALLEGRA) 180 MG tablet Take 180 mg by mouth every 14 (fourteen) days.     fluticasone (FLONASE) 50 MCG/ACT nasal spray PLACE 1 SPRAY INTO BOTH NOSTRILS DAILY AS NEEDED FOR ALLERGIES. 48 mL 1   furosemide (LASIX) 20 MG tablet Take 1 tablet (20 mg total) by mouth daily. 90 tablet 1   glucose blood (ONETOUCH VERIO) test strip Use to check blood sugar three times daily. 100 each 2   insulin aspart protamine - aspart (NOVOLOG MIX 70/30 FLEXPEN) (70-30) 100 UNIT/ML FlexPen Inject 28 Units into the skin 2 (two) times daily with a meal. 15 mL 6   Insulin Pen Needle (PX SHORTLENGTH PEN NEEDLES) 31G X 8 MM MISC 1 each by Does not apply route 2 (two) times daily before a meal. 100 each 12   Prednisol Ace-Moxiflox-Bromfen 1-0.5-0.075 % SUSP Place 1 drop into the right eye 4 (four) times daily. Start 1 day before the surgery on 10/19/21      valsartan-hydrochlorothiazide (DIOVAN-HCT) 80-12.5 MG tablet Take 1 tablet by mouth daily. 90 tablet 1   No current facility-administered medications on file prior to visit.    Allergies  Allergen Reactions   Lisinopril Anaphylaxis   Shellfish Allergy Hives and Swelling    Social History   Socioeconomic History   Marital status: Divorced    Spouse name: Not on file   Number of children: Not on file   Years of education: Not on file   Highest education level: Not on file  Occupational History   Not on file  Tobacco Use   Smoking status: Never   Smokeless tobacco: Never   Tobacco comments:    Never smoked. Tobacco or cannabis  Vaping Use   Vaping status:  Never Used  Substance and Sexual Activity   Alcohol use: Yes    Comment: 07/23/2014 "might have a few drinks on holidays or at cookouts"   Drug use: No   Sexual activity: Not Currently  Other Topics Concern   Not on file  Social History Narrative   Coaches [employer]    Social Determinants of Health   Financial Resource Strain: Low Risk  (09/26/2022)   Overall Financial Resource Strain (CARDIA)    Difficulty of Paying Living Expenses: Not hard at all  Food Insecurity: No Food Insecurity (09/26/2022)   Hunger Vital Sign    Worried About Running Out of Food in the Last Year: Never true    Ran Out of Food in the Last Year: Never true  Transportation Needs: No Transportation Needs (10/05/2022)   PRAPARE - Administrator, Civil Service (Medical): No    Lack of Transportation (Non-Medical): No  Physical Activity: Insufficiently Active (09/26/2022)   Exercise Vital Sign    Days of Exercise per Week: 3 days    Minutes of Exercise per Session: 30 min  Stress: No Stress Concern Present (09/26/2022)   [employer] of Occupational Health - Occupational Stress Questionnaire    Feeling of Stress : Not at all  Social Connections: Unknown (09/26/2022)   Social Connection and Isolation Panel [NHANES]     Frequency of Communication with Friends and Family: More than three times a week    Frequency of Social Gatherings with Friends and Family: Patient declined    Attends Religious Services: Not on file    Active Member of Clubs or Organizations: No    Attends Banker Meetings: Never    Marital Status: Divorced  Catering manager Violence: Not At Risk (10/19/2018)   Humiliation, Afraid, Rape, and Kick questionnaire    Fear of Current or Ex-Partner: No    Emotionally Abused: No    Physically Abused: No    Sexually Abused: No    Family History  Problem Relation Age of Onset   Stroke Mother    Fabry's disease Mother    Fabry's disease Brother    Colon cancer Neg Hx     Past Surgical History:  Procedure Laterality Date   CARDIAC CATHETERIZATION  03-12-2011  DR Verdis Prime   HYPERTROPHIC CARDIOMYOPATHY WITH LV CAVITY  APPEARANCE CONSISTANT WITH SIGNIFICANT APICAL HYPERTROPHY/ NORMAL LVSF / EF 55%/ EVIDENCE OF DIASTOLIC DYSFUNCTION WITH EDP OF 23-18mmHg AFTER A-WAVE/ NORMAL CORONARY ARTERIES   EVALUATION UNDER ANESTHESIA WITH FISTULECTOMY N/A 06/23/2015   Procedure: EXAM UNDER ANESTHESIA WITH POSSIBLE FISTULOTOMY;  Surgeon: Jimmye Norman, MD;  Location: [facility] OR;  Service: General;  Laterality: N/A;   HERNIA REPAIR  06/08/2011   Umbilical Hernia Repair   INCISION AND DRAINAGE PERIRECTAL ABSCESS Left 07/29/2014   Procedure: IRRIGATION AND DEBRIDEMENT PERIRECTAL ABSCESS;  Surgeon: Frederik Schmidt, MD;  Location: [facility] OR;  Service: General;  Laterality: Left;  Prone position   INSERT / REPLACE / REMOVE PACEMAKER  02/12/2011   SJM implanted by Dr Johney Frame for Mobitz II second degree AV block and syncope   IRRIGATION AND DEBRIDEMENT ABSCESS N/A 07/27/2013   Procedure: IRRIGATION AND DEBRIDEMENT OF SKIN, SOFT TISSUE AND MUSCLES OF UPPER BACK (11X19X4cm) WITH 10 BLADE AND PULSATILE LAVAGE ;  Surgeon: Atilano Ina, MD;  Location: [facility] OR;  Service: General;  Laterality: N/A;   PPM GENERATOR CHANGEOUT  N/A 05/22/2019   Procedure: PPM GENERATOR CHANGEOUT;  Surgeon: Hillis Range, MD;  Location: [facility] INVASIVE  CV LAB;  Service: Cardiovascular;  Laterality: N/A;   UMBILICAL HERNIA REPAIR  03/22/2012   Procedure: HERNIA REPAIR UMBILICAL ADULT;  Surgeon: Romie Levee, MD;  Location: [facility] ORS;  Service: General;  Laterality: N/A;  Umbilical Hernia Repair     ROS: Review of Systems Negative except as stated above  PHYSICAL EXAM: BP 129/79 (BP Location: Left Arm, Patient Position: Sitting, Cuff Size: Large)   Pulse 60   Temp 97.9 F (36.6 C) (Oral)   Ht 5\' 10"  (1.778 m)   Wt 264 lb (119.7 kg)   SpO2 98%   BMI 37.88 kg/m   Wt Readings from Last 3 Encounters:  12/27/22 264 lb (119.7 kg)  11/17/22 269 lb 6.4 oz (122.2 kg)  11/08/22 271 lb (122.9 kg)    Physical Exam  General appearance - alert, well appearing, older AAM and in no distress Mental status - normal mood, behavior, speech, dress, motor activity, and thought processes Neck - supple, no significant adenopathy Chest - clear to auscultation, no wheezes, rales or rhonchi, symmetric air entry Heart - normal rate, regular rhythm, normal S1, S2, no murmurs, rubs, clicks or gallops Extremities - lymph-edema legs.  Wearing compression socks      Latest Ref Rng & Units 06/25/2022    3:32 PM 10/26/2021    9:45 AM 02/23/2021   10:26 AM  CMP  Glucose 70 - 99 mg/dL 161  096  045   BUN 8 - 27 mg/dL 30  20  27    Creatinine 0.76 - 1.27 mg/dL 4.09  8.11  9.14   Sodium 134 - 144 mmol/L 140  149  146   Potassium 3.5 - 5.2 mmol/L 4.5  4.3  4.7   Chloride 96 - 106 mmol/L 100  109  107   CO2 20 - 29 mmol/L 22  22  24    Calcium 8.6 - 10.2 mg/dL 9.6  9.8  9.7   Total Protein 6.0 - 8.5 g/dL 7.5  7.8    Total Bilirubin 0.0 - 1.2 mg/dL 0.9  0.5    Alkaline Phos 44 - 121 IU/L 132  138    AST 0 - 40 IU/L 33  33    ALT 0 - 44 IU/L 29  32     Lipid Panel     Component Value Date/Time   CHOL 168 10/26/2021 0945   TRIG 80 10/26/2021 0945   HDL  62 10/26/2021 0945   CHOLHDL 2.7 10/26/2021 0945   CHOLHDL 4.4 10/20/2018 0324   VLDL 18 10/20/2018 0324   LDLCALC 91 10/26/2021 0945    CBC    Component Value Date/Time   WBC 7.3 06/25/2022 1532   WBC 7.0 05/22/2019 1201   RBC 5.21 06/25/2022 1532   RBC 5.29 05/22/2019 1201   HGB 15.3 06/25/2022 1532   HCT 45.4 06/25/2022 1532   PLT 178 06/25/2022 1532   MCV 87 06/25/2022 1532   MCH 29.4 06/25/2022 1532   MCH 29.3 05/22/2019 1201   MCHC 33.7 06/25/2022 1532   MCHC 32.1 05/22/2019 1201   RDW 12.1 06/25/2022 1532   LYMPHSABS 2.9 08/23/2019 1152   MONOABS 1.3 (H) 10/24/2018 0515   EOSABS 0.3 08/23/2019 1152   BASOSABS 0.1 08/23/2019 1152    ASSESSMENT AND PLAN: 1. Type 2 diabetes mellitus with obesity (HCC) At goal Cont Novolog 70/30 24 units BID.   Clinical pharmacist to meet with pt today to clarify whether or not the Ozempic 0.25 mg pen can be dialed up  to 0.5 mg.  I suspect it does.  Recommend increase to 0.5 mg once a wk - POCT glucose (manual entry) - POCT glycosylated hemoglobin (Hb A1C) - Microalbumin / creatinine urine ratio - Semaglutide,0.25 or 0.5MG /DOS, (OZEMPIC, 0.25 OR 0.5 MG/DOSE,) 2 MG/3ML SOPN; Inject 0.5 mg into the skin once a week.  Dispense: 3 mL; Refill: 1  2. Insulin long-term use (HCC) See #1  3. Long-term (current) use of injectable non-insulin antidiabetic drugs See #1  4. Hypertension associated with type 2 diabetes mellitus (HCC) At goal. Continue meds as listed above  5. Stage 3b chronic kidney disease (HCC) Followed by nephrology.  Not on NSAIDs  6. 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HCC) Plan for port placement for Fabrazyme infusion  7. Need for Tdap vaccination Given today.  Addendum: Patient was able to touch base with [place] via MyChart.  He took pictures of the pens that he has at home.  I spoke with the clinical pharmacist regarding his MyChart message.  Our instructions to the patient was to continue the Ozempic 0.5 mg once a wk for 2 wks then  increase to 1 mg weekly.  He has the 1 mg pens at home.  I have add the 1 mg rxn back to his med list.  Patient was given the opportunity to ask questions.  Patient verbalized understanding of the plan and was able to repeat key elements of the plan.   This documentation was completed using Paediatric nurse.  Any transcriptional errors are unintentional.  Orders Placed This Encounter  Procedures   Tdap vaccine greater than or equal to 7yo IM   Microalbumin / creatinine urine ratio   POCT glucose (manual entry)   POCT glycosylated hemoglobin (Hb A1C)     Requested Prescriptions   Signed Prescriptions Disp Refills   Semaglutide,0.25 or 0.5MG /DOS, (OZEMPIC, 0.25 OR 0.5 MG/DOSE,) 2 MG/3ML SOPN 3 mL 1    Sig: Inject 0.5 mg into the skin once a week.    Return in about 4 months (around 04/29/2023).  Jonah Blue, MD, FACP

## 2022-12-27 NOTE — Telephone Encounter (Signed)
Can we attempt a PA for this patient's Josephine Igo Receiver?

## 2022-12-28 LAB — MICROALBUMIN / CREATININE URINE RATIO
Creatinine, Urine: 118.5 mg/dL
Microalb/Creat Ratio: 372 mg/g creat — ABNORMAL HIGH (ref 0–29)
Microalbumin, Urine: 440.5 ug/mL

## 2022-12-29 ENCOUNTER — Ambulatory Visit (HOSPITAL_COMMUNITY): Payer: Medicare HMO

## 2022-12-29 ENCOUNTER — Other Ambulatory Visit: Payer: Self-pay

## 2022-12-30 ENCOUNTER — Encounter: Payer: Self-pay | Admitting: Internal Medicine

## 2022-12-30 ENCOUNTER — Other Ambulatory Visit: Payer: Self-pay

## 2022-12-31 ENCOUNTER — Other Ambulatory Visit: Payer: Self-pay | Admitting: Pharmacist

## 2022-12-31 ENCOUNTER — Other Ambulatory Visit: Payer: Self-pay

## 2022-12-31 DIAGNOSIS — Z794 Long term (current) use of insulin: Secondary | ICD-10-CM

## 2022-12-31 DIAGNOSIS — E7521 Fabry (-Anderson) disease: Secondary | ICD-10-CM | POA: Diagnosis not present

## 2022-12-31 MED ORDER — FREESTYLE LIBRE 2 READER DEVI
0 refills | Status: DC
Start: 2022-12-31 — End: 2023-11-01

## 2022-12-31 MED ORDER — FREESTYLE LIBRE SENSOR SYSTEM MISC: 12 refills | Status: DC

## 2023-01-03 ENCOUNTER — Other Ambulatory Visit: Payer: Self-pay

## 2023-01-04 ENCOUNTER — Other Ambulatory Visit: Payer: Self-pay

## 2023-01-05 NOTE — Progress Notes (Signed)
Remote pacemaker transmission.   

## 2023-01-10 ENCOUNTER — Ambulatory Visit: Payer: Self-pay | Admitting: Licensed Clinical Social Worker

## 2023-01-12 NOTE — Patient Outreach (Signed)
  Care Coordination   Follow Up Visit Note   01/10/2023 Name: [name] MRN: [id number] DOB: September 09, 1960  [name] is a 62 y.o. year old male who sees Marcine Matar, MD for primary care. I spoke with  [name] by phone today.  What matters to the patients health and wellness today?  Management of health conditions and transportation    Goals Addressed             This Visit's Progress    Obtain Supportive Resources   On track    Activities and task to complete in order to accomplish goals.   Keep all upcoming appointment discussed today Continue with compliance of taking medication prescribed by Doctor  Utilize strategies discussed to assist with goals (weight loss/meal prep)  Continue utilizing healthy coping skills to promote self-care                    SDOH assessments and interventions completed:  No     Care Coordination Interventions:  Yes, provided   Follow up plan: Follow up call scheduled for 4-8 weeks    Encounter Outcome:  Pt. Visit Completed   Jenel Lucks, MSW, LCSW [facility] Care Management [facility]  [facility] [email or web address] Phone [phone] 10:51 PM

## 2023-01-12 NOTE — Patient Instructions (Signed)
Visit Information  Thank you for taking time to visit with me today. Please don't hesitate to contact me if I can be of assistance to you.   Following are the goals we discussed today:   Goals Addressed             This Visit's Progress    Obtain Supportive Resources   On track    Activities and task to complete in order to accomplish goals.   Keep all upcoming appointment discussed today Continue with compliance of taking medication prescribed by Doctor  Utilize strategies discussed to assist with goals (weight loss/meal prep)  Continue utilizing healthy coping skills to promote self-care                    Our next appointment is by telephone on 09/16 at 10 AM  Please call the care guide team at [phone] if you need to cancel or reschedule your appointment.   If you are experiencing a Mental Health or Behavioral Health Crisis or need someone to talk to, please call the Suicide and Crisis Lifeline: 988 call 911   Patient verbalizes understanding of instructions and care plan provided today and agrees to view in MyChart. Active MyChart status and patient understanding of how to access instructions and care plan via MyChart confirmed with patient.     Jenel Lucks, MSW, LCSW [facility] Care Management [place]  [email or web address] Phone [phone] 10:52 PM

## 2023-01-14 ENCOUNTER — Encounter: Payer: Self-pay | Admitting: Internal Medicine

## 2023-01-14 DIAGNOSIS — E7521 Fabry (-Anderson) disease: Secondary | ICD-10-CM | POA: Diagnosis not present

## 2023-01-17 ENCOUNTER — Other Ambulatory Visit: Payer: Self-pay

## 2023-01-21 ENCOUNTER — Other Ambulatory Visit: Payer: Self-pay | Admitting: Internal Medicine

## 2023-01-21 DIAGNOSIS — I152 Hypertension secondary to endocrine disorders: Secondary | ICD-10-CM

## 2023-01-22 ENCOUNTER — Other Ambulatory Visit: Payer: Self-pay | Admitting: Internal Medicine

## 2023-01-22 DIAGNOSIS — G463 Brain stem stroke syndrome: Secondary | ICD-10-CM

## 2023-01-24 NOTE — Telephone Encounter (Signed)
Last RF 08/04/22 #90 2 RF too soon  Requested Prescriptions  Refused Prescriptions Disp Refills   atorvastatin (LIPITOR) 80 MG tablet [Pharmacy Med Name: ATORVASTATIN 80 MG TABLET] 90 tablet 2    Sig: TAKE 1 TABLET BY MOUTH DAILY AT 6 PM.     Cardiovascular:  Antilipid - Statins Failed - 01/22/2023  2:15 PM      Failed - Lipid Panel in normal range within the last 12 months    Cholesterol, Total  Date Value Ref Range Status  10/26/2021 168 100 - 199 mg/dL Final   LDL Chol Calc (NIH)  Date Value Ref Range Status  10/26/2021 91 0 - 99 mg/dL Final   HDL  Date Value Ref Range Status  10/26/2021 62 >39 mg/dL Final   Triglycerides  Date Value Ref Range Status  10/26/2021 80 0 - 149 mg/dL Final         Passed - Patient is not pregnant      Passed - Valid encounter within last 12 months    Recent Outpatient Visits           4 weeks ago Type 2 diabetes mellitus with obesity (HCC)   [place] [facility] [place], Gavin Pound B, MD   5 months ago Type 2 diabetes mellitus with other circulatory complication, with long-term current use of insulin (HCC)   [place] [facility] Jonah Blue B, MD   7 months ago Type 2 diabetes mellitus with other circulatory complication, with long-term current use of insulin [facility])   [place] [facility] Jonah Blue B, MD   1 year ago Type 2 diabetes mellitus with peripheral neuropathy [facility])   [place] [facility] Jonah Blue B, MD   1 year ago Type 2 diabetes mellitus with other circulatory complication, with long-term current use of insulin [facility])   [facility] Marcine Matar, MD       Future Appointments             In 3 months Laural Benes, Binnie Rail, MD [facility]

## 2023-01-27 ENCOUNTER — Other Ambulatory Visit: Payer: Self-pay | Admitting: Internal Medicine

## 2023-01-27 DIAGNOSIS — M109 Gout, unspecified: Secondary | ICD-10-CM

## 2023-01-28 DIAGNOSIS — E7521 Fabry (-Anderson) disease: Secondary | ICD-10-CM | POA: Diagnosis not present

## 2023-02-11 DIAGNOSIS — E7521 Fabry (-Anderson) disease: Secondary | ICD-10-CM | POA: Diagnosis not present

## 2023-02-16 ENCOUNTER — Telehealth (HOSPITAL_COMMUNITY): Payer: Self-pay

## 2023-02-16 NOTE — Telephone Encounter (Signed)
-----   Message from Roanna Banning sent at 02/16/2023  1:40 PM EDT ----- Regarding: RE: Port w/overnight stay Hi,  The options are to place the port under single agent sedation which negates the post procedure requirement of having someone with him overnight or having to admit him. This is what I would recommend as it is well tolerated.  If we choose to continue with full moderate sedation then  another service would admit him overnight, not VIR. I do not recommend this for a PAC placement.   Roanna Banning, MD [facility]  Pager. [phone] Clinic. [phone] ----- Message ----- From: Sharee Pimple Sent: 02/16/2023  12:57 PM EDT To: Ir Procedure Requests Subject: Port w/overnight stay                          Hi,   Just received a call from [facility] regarding this pt. He is in urgent need of a port but he does not have any transportion or anyone to stay with him post procedure. The office is wanting to know if he can be admitted after the procedure so that he can get this done? I'm not sure if there is an alternative so that he doesn't need to be admitted or if admitting is a possibility. Please advise.   Thanks,  Fara Boros

## 2023-02-17 ENCOUNTER — Telehealth (HOSPITAL_COMMUNITY): Payer: Self-pay

## 2023-02-17 NOTE — Telephone Encounter (Signed)
Called to schedule port placement, no answer, left vm. AB  

## 2023-02-21 ENCOUNTER — Telehealth (INDEPENDENT_AMBULATORY_CARE_PROVIDER_SITE_OTHER): Payer: Self-pay | Admitting: Internal Medicine

## 2023-02-21 ENCOUNTER — Ambulatory Visit: Payer: Self-pay | Admitting: Licensed Clinical Social Worker

## 2023-02-21 NOTE — Telephone Encounter (Signed)
Copied from CRM [id number]. Topic: General - Other >> Feb 21, 2023  2:46 PM Everette C wrote: Reason for CRM: The patient would like to be contacted by Ann Held directly when possible   Please contact further when available

## 2023-02-23 ENCOUNTER — Encounter: Payer: Self-pay | Admitting: Internal Medicine

## 2023-02-23 NOTE — Patient Instructions (Signed)
Visit Information  Thank you for taking time to visit with me today. Please don't hesitate to contact me if I can be of assistance to you.   Following are the goals we discussed today:   Goals Addressed             This Visit's Progress    Obtain Supportive Resources   On track    Activities and task to complete in order to accomplish goals.   Keep all upcoming appointment discussed today Continue with compliance of taking medication prescribed by Doctor  Utilize strategies discussed to assist with goals (weight loss/meal prep)  Continue utilizing healthy coping skills to promote self-care                    Our next appointment is by telephone on 10/14 at 10 AM  Please call the care guide team at [phone] if you need to cancel or reschedule your appointment.   If you are experiencing a Mental Health or Behavioral Health Crisis or need someone to talk to, please call the Suicide and Crisis Lifeline: 988 call 911   Patient verbalizes understanding of instructions and care plan provided today and agrees to view in MyChart. Active MyChart status and patient understanding of how to access instructions and care plan via MyChart confirmed with patient.     Jenel Lucks, MSW, LCSW [facility] Care Management   [facility] [email or web address] Phone [phone] 4:04 PM

## 2023-02-23 NOTE — Patient Outreach (Signed)
Care Coordination   Follow Up Visit Note   02/21/2023 Name: [name] MRN: [id number] DOB: 1961-03-10  [name] is a 62 y.o. year old male who sees [name] Matar, MD for primary care. I spoke with  [name] by phone today.  What matters to the patients health and wellness today?  Symptom Management    Goals Addressed             This Visit's Progress    Obtain Supportive Resources   On track    Activities and task to complete in order to accomplish goals.   Keep all upcoming appointment discussed today Continue with compliance of taking medication prescribed by Doctor  Utilize strategies discussed to assist with goals (weight loss/meal prep)  Continue utilizing healthy coping skills to promote self-care                    SDOH assessments and interventions completed:  No     Care Coordination Interventions:  Yes, provided  Interventions Today    Flowsheet Row Most Recent Value  Chronic Disease   Chronic disease during today's visit Hypertension (HTN), Chronic Kidney Disease/End Stage Renal Disease (ESRD)  General Interventions   General Interventions Discussed/Reviewed General Interventions Reviewed, [facility], Doctor Visits  [Transportation options discussed and working well for pt]  Doctor Visits Discussed/Reviewed Doctor Visits Reviewed  [Has a port date surgery on September 25. Won't need anyone to stay with him overnight, therefore, keep for two hours and alleviate need for on-site nurse.]  Mental Health Interventions   Mental Health Discussed/Reviewed Mental Health Reviewed, Coping Strategies, Anxiety  [Processed feelings associated upcoming procedure, car repairs, and upcoming time changes]  Nutrition Interventions   Nutrition Discussed/Reviewed Nutrition Reviewed  Pharmacy Interventions   Pharmacy Dicussed/Reviewed Pharmacy Topics Reviewed, Medication Adherence  [Has the libre device finally last month, easier to  check blood sugars (nothing over 140)]  Safety Interventions   Safety Discussed/Reviewed Safety Reviewed       Follow up plan: Follow up call scheduled for 4-6 weeks    Encounter Outcome:  Patient Visit Completed   [name], MSW, LCSW [facility] Care Management [facility]  [facility] [email or web address] Phone [phone] 4:03 PM

## 2023-02-25 ENCOUNTER — Encounter: Payer: Self-pay | Admitting: Internal Medicine

## 2023-02-25 DIAGNOSIS — E7521 Fabry (-Anderson) disease: Secondary | ICD-10-CM | POA: Diagnosis not present

## 2023-02-26 ENCOUNTER — Encounter: Payer: Self-pay | Admitting: Internal Medicine

## 2023-02-28 ENCOUNTER — Other Ambulatory Visit: Payer: Self-pay | Admitting: Internal Medicine

## 2023-02-28 MED ORDER — LIDOCAINE-PRILOCAINE 2.5-2.5 % EX CREA: TOPICAL_CREAM | CUTANEOUS | 0 refills | Status: DC

## 2023-03-01 ENCOUNTER — Other Ambulatory Visit: Payer: Self-pay | Admitting: Radiology

## 2023-03-01 DIAGNOSIS — D509 Iron deficiency anemia, unspecified: Secondary | ICD-10-CM

## 2023-03-01 NOTE — H&P (Incomplete)
Chief Complaint: Patient was seen in consultation today for port-a-catheter placement.   Referring Physician(s): [name],[name]  Supervising Physician: Malachy Moan  Patient Status: [facility] - Out-pt  History of Present Illness: [name] is a 62 y.o. 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 He is treated with infusions every two weeks. He is in need of durable venous access to facilitate these infusions.    Interventional Radiology has been asked to evaluate this patient for an image-guided port-a-catheter placement.   Past Medical History:  Diagnosis Date   Allergy [id number]   Dont know. 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 right worse   History of cellulitis of skin with lymphangitis LEFT LEG   HOCM (hypertrophic obstructive cardiomyopathy) (HCC)    a. Echo 11/16: Severe LVH, EF 55-60%, abnormal GLS consistent with HOCM, no SAM, mild LAE   Hypertension 20 years   Lymphedema of lower extremity LEFT  >  RIGHT   "using ankle high socks at home; pump doesn't work for me" (07/23/2014)   Pacemaker    02/12/11   Pneumonia 08/2011   Seasonal asthma NO INHALERS   30 years   Second degree Mobitz II AV block    with syncope, s/p PPM   Short of breath on exertion    Stroke (HCC) 10/23/2017   Type II diabetes mellitus (HCC) 2012   INSULIN  DEPENDENT    Past Surgical History:  Procedure Laterality Date   CARDIAC CATHETERIZATION  03-12-2011  DR Verdis Prime   HYPERTROPHIC CARDIOMYOPATHY WITH LV CAVITY  APPEARANCE CONSISTANT WITH SIGNIFICANT APICAL HYPERTROPHY/ NORMAL LVSF / EF 55%/ EVIDENCE OF DIASTOLIC DYSFUNCTION WITH EDP OF 23-41mmHg AFTER A-WAVE/ NORMAL CORONARY ARTERIES   EVALUATION UNDER ANESTHESIA WITH FISTULECTOMY N/A 06/23/2015   Procedure: EXAM UNDER ANESTHESIA WITH POSSIBLE FISTULOTOMY;  Surgeon: Jimmye Norman, MD;  Location: [facility] OR;  Service: General;  Laterality: N/A;   HERNIA REPAIR  06/08/2011   Umbilical Hernia Repair   INCISION AND DRAINAGE PERIRECTAL ABSCESS Left 07/29/2014   Procedure: IRRIGATION AND DEBRIDEMENT PERIRECTAL ABSCESS;  Surgeon: Frederik Schmidt, MD;  Location: [facility] OR;  Service: General;  Laterality: Left;  Prone position   INSERT / REPLACE / REMOVE PACEMAKER  02/12/2011   SJM implanted by Dr Johney Frame for Mobitz II second degree AV block and syncope   IRRIGATION AND DEBRIDEMENT ABSCESS N/A 07/27/2013   Procedure: IRRIGATION AND DEBRIDEMENT OF SKIN, SOFT TISSUE AND MUSCLES OF UPPER BACK (11X19X4cm) WITH 10 BLADE AND PULSATILE LAVAGE ;  Surgeon: Atilano Ina, MD;  Location: [facility] OR;  Service: General;  Laterality: N/A;   PPM GENERATOR CHANGEOUT N/A 05/22/2019   Procedure: PPM GENERATOR CHANGEOUT;  Surgeon: Hillis Range, MD;  Location: [facility] INVASIVE CV LAB;  Service: Cardiovascular;  Laterality: N/A;   UMBILICAL HERNIA REPAIR  03/22/2012  Procedure: HERNIA REPAIR UMBILICAL ADULT;  Surgeon: Romie Levee, MD;  Location: [facility] ORS;  Service: General;  Laterality: N/A;  Umbilical Hernia Repair     Allergies: Lisinopril and Shellfish allergy  Medications: Prior to Admission medications   Medication Sig Start Date End Date Taking? Authorizing Provider  lidocaine-prilocaine (EMLA) cream Apply thin layer to affected area of skin daily PRN 02/28/23   Marcine Matar, MD  Accu-Chek FastClix Lancets MISC 1 each by Other  route 3 (three) times daily. 01/18/19   Marcine Matar, MD  acetaminophen (TYLENOL) 325 MG tablet Take 1-2 tablets (325-650 mg total) by mouth every 4 (four) hours as needed for mild pain. 05/23/19   Graciella Freer, PA-C  Agalsidase beta (FABRAZYME IV) Inject 115 mg into the vein every 14 (fourteen) days.     [provider]  albuterol (PROAIR HFA) 108 (90 Base) MCG/ACT inhaler Inhale 1 puff into the lungs every 6 (six) hours as needed for wheezing or shortness of breath. 03/18/21   Marcine Matar, MD  allopurinol (ZYLOPRIM) 100 MG tablet TAKE 3 TABLETS BY MOUTH EVERY DAY 01/27/23   Marcine Matar, MD  amLODipine (NORVASC) 10 MG tablet Take 1 tablet (10 mg total) by mouth daily. 06/25/22   Marcine Matar, MD  atorvastatin (LIPITOR) 80 MG tablet TAKE 1 TABLET BY MOUTH DAILY AT 6 PM. 08/04/22   Marcine Matar, MD  clopidogrel (PLAVIX) 75 MG tablet Take 1 tablet (75 mg total) by mouth daily. 06/25/22   Marcine Matar, MD  Continuous Glucose Receiver (FREESTYLE LIBRE 2 READER) DEVI Use to check blood sugar continuously. Change sensor once every 14 days. E11.59 12/31/22   Marcine Matar, MD  Continuous Glucose Sensor (FREESTYLE LIBRE SENSOR SYSTEM) MISC Use to check blood sugar continuously. Change sensor once every 14 days. E11.59 12/31/22   Marcine Matar, MD  doxazosin (CARDURA) 2 MG tablet Take 0.5 tablets (1 mg total) by mouth daily. 06/25/22   Marcine Matar, MD  fexofenadine (ALLEGRA) 180 MG tablet Take 180 mg by mouth every 14 (fourteen) days.    [provider]  fluticasone (FLONASE) 50 MCG/ACT nasal spray PLACE 1 SPRAY INTO BOTH NOSTRILS DAILY AS NEEDED FOR ALLERGIES. 09/05/20   Marcine Matar, MD  furosemide (LASIX) 20 MG tablet Take 1 tablet (20 mg total) by mouth daily. 06/25/22   Marcine Matar, MD  glucose blood (ONETOUCH VERIO) test strip Use to check blood sugar three times daily. 07/14/22   Marcine Matar, MD  insulin aspart  protamine - aspart (NOVOLOG MIX 70/30 FLEXPEN) (70-30) 100 UNIT/ML FlexPen Inject 28 Units into the skin 2 (two) times daily with a meal. 06/25/22   Marcine Matar, MD  Insulin Pen Needle (PX SHORTLENGTH PEN NEEDLES) 31G X 8 MM MISC 1 each by Does not apply route 2 (two) times daily before a meal. 04/11/22   Marcine Matar, MD  Prednisol Ace-Moxiflox-Bromfen 1-0.5-0.075 % SUSP Place 1 drop into the right eye 4 (four) times daily. Start 1 day before the surgery on 10/19/21 10/14/21   [provider]  Semaglutide, 1 MG/DOSE, 4 MG/3ML SOPN Inject 1 mg as directed once a week. 12/27/22   Marcine Matar, MD  Semaglutide,0.25 or 0.5MG /DOS, (OZEMPIC, 0.25 OR 0.5 MG/DOSE,) 2 MG/3ML SOPN Inject 0.5 mg into the skin once a week. 12/27/22   Marcine Matar, MD  valsartan-hydrochlorothiazide (DIOVAN-HCT) 80-12.5 MG tablet TAKE 1 TABLET BY MOUTH EVERY DAY 01/21/23  Marcine Matar, MD     Family History  Problem Relation Age of Onset   Stroke Mother    Fabry's disease Mother    Fabry's disease Brother    Colon cancer Neg Hx     Social History   Socioeconomic History   Marital status: Divorced    Spouse name: Not on file   Number of children: Not on file   Years of education: Not on file   Highest education level: Not on file  Occupational History   Not on file  Tobacco Use   Smoking status: Never   Smokeless tobacco: Never   Tobacco comments:    Never smoked. 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   Financial Resource Strain: Low Risk  (09/26/2022)   Overall Financial Resource Strain (CARDIA)    Difficulty of Paying Living Expenses: Not hard at all   Food Insecurity: No Food Insecurity (09/26/2022)   Hunger Vital Sign    Worried About Running Out of Food in the Last Year: Never true    Ran Out of Food in the Last Year: Never true  Transportation Needs: No Transportation Needs (10/05/2022)   PRAPARE - Administrator, Civil Service (Medical): No    Lack of Transportation (Non-Medical): No  Physical Activity: Insufficiently Active (09/26/2022)   Exercise Vital Sign    Days of Exercise per Week: 3 days    Minutes of Exercise per Session: 30 min  Stress: No Stress Concern Present (09/26/2022)   [employer] of Occupational Health - Occupational Stress Questionnaire    Feeling of Stress : Not at all  Social Connections: Unknown (09/26/2022)   Social Connection and Isolation Panel [NHANES]    Frequency of Communication with Friends and Family: More than three times a week    Frequency of Social Gatherings with Friends and Family: Patient declined    Attends Religious Services: Not on Insurance claims handler of Clubs or Organizations: No    Attends Banker Meetings: Never    Marital Status: Divorced    Review of Systems: A 12 point ROS discussed and pertinent positives are indicated in the HPI above.  All other systems are negative.  Review of Systems  Vital Signs: There were no vitals taken for this visit.  Physical Exam  Imaging: No results found.  Labs:  CBC: Recent Labs    06/25/22 1532  WBC 7.3  HGB 15.3  HCT 45.4  PLT 178    COAGS: No results for input(s): "INR", "APTT" in the last 8760 hours.  BMP: Recent Labs    06/25/22 1532  NA 140  K 4.5  CL 100  CO2 22  GLUCOSE 410*  BUN 30*  CALCIUM 9.6  CREATININE 2.26*    LIVER FUNCTION TESTS: Recent Labs    06/25/22 1532  BILITOT 0.9  AST 33  ALT 29  ALKPHOS 132*  PROT 7.5  ALBUMIN 3.9    TUMOR MARKERS: No results for input(s): "AFPTM", "CEA", "CA199", "CHROMGRNA" in the last 8760 hours.  Assessment and  Plan:  Fabry's disease; durable venous  access needed for ongoing infusions: [name]. [name], 62 year old male, presents today to [facility] Interventional Radiology department for an image-guided port-a-catheter placement. Due to transportation issues, the procedure will be done using only one sedating agent.   Risks and benefits of image-guided port-a-catheter placement were discussed with the patient including, but not limited to bleeding, infection, pneumothorax, or fibrin sheath development and need for additional procedures.  All of the patient's questions were answered, patient is agreeable to proceed. He has been NPO. He is a full code.   Consent signed and in chart.  Thank you for this interesting consult.  I greatly enjoyed meeting [name] and look forward to participating in their care.  A copy of this report was sent to the requesting provider on this date.  Electronically Signed: Alwyn Ren, AGACNP-BC [phone] 03/01/2023, 9:28 AM   I spent a total of  30 Minutes   in face to face in clinical consultation, greater than 50% of which was counseling/coordinating care for port-a-catheter placement.

## 2023-03-02 ENCOUNTER — Encounter (HOSPITAL_COMMUNITY): Payer: Self-pay

## 2023-03-02 ENCOUNTER — Ambulatory Visit (HOSPITAL_COMMUNITY)
Admission: RE | Admit: 2023-03-02 | Discharge: 2023-03-02 | Disposition: A | Discharge status: Home / Self Care | Payer: Medicare HMO | Source: Ambulatory Visit | Attending: Nephrology | Admitting: Nephrology

## 2023-03-02 DIAGNOSIS — I130 Hypertensive heart and chronic kidney disease with heart failure and stage 1 through stage 4 chronic kidney disease, or unspecified chronic kidney disease: Secondary | ICD-10-CM | POA: Diagnosis not present

## 2023-03-02 DIAGNOSIS — I5032 Chronic diastolic (congestive) heart failure: Secondary | ICD-10-CM | POA: Diagnosis not present

## 2023-03-02 DIAGNOSIS — D509 Iron deficiency anemia, unspecified: Secondary | ICD-10-CM

## 2023-03-02 DIAGNOSIS — Z950 Presence of cardiac pacemaker: Secondary | ICD-10-CM | POA: Diagnosis not present

## 2023-03-02 DIAGNOSIS — E1122 Type 2 diabetes mellitus with diabetic chronic kidney disease: Secondary | ICD-10-CM | POA: Insufficient documentation

## 2023-03-02 DIAGNOSIS — Z7985 Long-term (current) use of injectable non-insulin antidiabetic drugs: Secondary | ICD-10-CM | POA: Diagnosis not present

## 2023-03-02 DIAGNOSIS — I421 Obstructive hypertrophic cardiomyopathy: Secondary | ICD-10-CM | POA: Diagnosis not present

## 2023-03-02 DIAGNOSIS — Z8673 Personal history of transient ischemic attack (TIA), and cerebral infarction without residual deficits: Secondary | ICD-10-CM | POA: Insufficient documentation

## 2023-03-02 DIAGNOSIS — E7521 Fabry (-Anderson) disease: Secondary | ICD-10-CM | POA: Diagnosis not present

## 2023-03-02 DIAGNOSIS — N182 Chronic kidney disease, stage 2 (mild): Secondary | ICD-10-CM | POA: Diagnosis not present

## 2023-03-02 DIAGNOSIS — Z794 Long term (current) use of insulin: Secondary | ICD-10-CM | POA: Diagnosis not present

## 2023-03-02 HISTORY — PX: IR IMAGING GUIDED PORT INSERTION: IMG5740

## 2023-03-02 LAB — GLUCOSE, CAPILLARY
Glucose-Capillary: 107 mg/dL — ABNORMAL HIGH (ref 70–99)
Glucose-Capillary: 114 mg/dL — ABNORMAL HIGH (ref 70–99)

## 2023-03-02 MED ORDER — MIDAZOLAM HCL 2 MG/2ML IJ SOLN
INTRAMUSCULAR | Status: AC
  Filled 2023-03-02: qty 2

## 2023-03-02 MED ORDER — MIDAZOLAM HCL 2 MG/2ML IJ SOLN
INTRAMUSCULAR | Status: AC | PRN
Start: 2023-03-02 — End: 2023-03-02
  Administered 2023-03-02: 2 mg via INTRAVENOUS

## 2023-03-02 MED ORDER — SODIUM CHLORIDE 0.9 % IV SOLN: INTRAVENOUS | Status: DC

## 2023-03-02 MED ORDER — FENTANYL CITRATE (PF) 100 MCG/2ML IJ SOLN
INTRAMUSCULAR | Status: AC
  Filled 2023-03-02: qty 2

## 2023-03-02 MED ORDER — LIDOCAINE-EPINEPHRINE 1 %-1:100000 IJ SOLN
INTRAMUSCULAR | Status: AC
  Filled 2023-03-02: qty 1

## 2023-03-02 MED ORDER — HEPARIN SOD (PORK) LOCK FLUSH 100 UNIT/ML IV SOLN
INTRAVENOUS | Status: AC
  Filled 2023-03-02: qty 5

## 2023-03-02 MED ORDER — LIDOCAINE-EPINEPHRINE 1 %-1:100000 IJ SOLN
20.0000 mL | Freq: Once | INTRAMUSCULAR | Status: AC
  Administered 2023-03-02: 13 mL via INTRADERMAL

## 2023-03-02 MED ORDER — HEPARIN SOD (PORK) LOCK FLUSH 100 UNIT/ML IV SOLN
500.0000 [IU] | Freq: Once | INTRAVENOUS | Status: AC
  Administered 2023-03-02: 500 [IU]

## 2023-03-02 NOTE — Consult Note (Signed)
Chief Complaint: Patient was seen in consultation today for Port-A Catheter at the request of [name],[name]  Referring Physician(s): [name],[name]  Supervising Physician: Malachy Moan  Patient Status: [facility] - Out-pt  History of Present Illness: [name] is a 62 y.o. male   Full Code Status Significant medical history for DM, CHF with pacemaker, CKD, HOCM, HTN CVA 9/25 Presents to IR for Port-A-Cath placement for access for IV medication to treat Fabry'd disease     Past Medical History:  Diagnosis Date   Allergy [id number]   Dont know. 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 right worse   History of cellulitis of skin with lymphangitis LEFT LEG   HOCM (hypertrophic obstructive cardiomyopathy) (HCC)    a. Echo 11/16: Severe LVH, EF 55-60%, abnormal GLS consistent with HOCM, no SAM, mild LAE   Hypertension 20 years   Lymphedema of lower extremity LEFT  >  RIGHT   "using ankle high socks at home; pump doesn't work for me" (07/23/2014)   Pacemaker    02/12/11   Pneumonia 08/2011   Seasonal asthma NO INHALERS   30 years   Second degree Mobitz II AV block    with syncope, s/p PPM   Short of breath on exertion    Stroke (HCC) 10/23/2017   Type II diabetes mellitus (HCC) 2012   INSULIN DEPENDENT    Past Surgical History:  Procedure Laterality Date   CARDIAC CATHETERIZATION  03-12-2011  DR Verdis Prime   HYPERTROPHIC CARDIOMYOPATHY WITH LV CAVITY   APPEARANCE CONSISTANT WITH SIGNIFICANT APICAL HYPERTROPHY/ NORMAL LVSF / EF 55%/ EVIDENCE OF DIASTOLIC DYSFUNCTION WITH EDP OF 23-60mmHg AFTER A-WAVE/ NORMAL CORONARY ARTERIES   EVALUATION UNDER ANESTHESIA WITH FISTULECTOMY N/A 06/23/2015   Procedure: EXAM UNDER ANESTHESIA WITH POSSIBLE FISTULOTOMY;  Surgeon: Jimmye Norman, MD;  Location: [facility] OR;  Service: General;  Laterality: N/A;   HERNIA REPAIR  06/08/2011   Umbilical Hernia Repair   INCISION AND DRAINAGE PERIRECTAL ABSCESS Left 07/29/2014   Procedure: IRRIGATION AND DEBRIDEMENT PERIRECTAL ABSCESS;  Surgeon: Frederik Schmidt, MD;  Location: [facility] OR;  Service: General;  Laterality: Left;  Prone position   INSERT / REPLACE / REMOVE PACEMAKER  02/12/2011   SJM implanted by Dr Johney Frame for Mobitz II second degree AV block and syncope   IRRIGATION AND DEBRIDEMENT ABSCESS N/A 07/27/2013   Procedure: IRRIGATION AND DEBRIDEMENT OF SKIN, SOFT TISSUE AND MUSCLES OF UPPER BACK (11X19X4cm) WITH 10 BLADE AND PULSATILE LAVAGE ;  Surgeon: Atilano Ina, MD;  Location: [facility] OR;  Service: General;  Laterality: N/A;   PPM GENERATOR CHANGEOUT N/A 05/22/2019   Procedure: PPM GENERATOR CHANGEOUT;  Surgeon: Hillis Range, MD;  Location: [facility] INVASIVE CV LAB;  Service: Cardiovascular;  Laterality: N/A;   UMBILICAL HERNIA REPAIR  03/22/2012   Procedure: HERNIA REPAIR UMBILICAL ADULT;  Surgeon: Romie Levee, MD;  Location: [facility] ORS;  Service: General;  Laterality: N/A;  Umbilical Hernia  Repair     Allergies: Lisinopril and Shellfish allergy  Medications: Prior to Admission medications   Medication Sig Start Date End Date Taking? Authorizing Provider  acetaminophen (TYLENOL) 325 MG tablet Take 1-2 tablets (325-650 mg total) by mouth every 4 (four) hours as needed for mild pain. 05/23/19  Yes Graciella Freer, PA-C  allopurinol (ZYLOPRIM) 100 MG tablet TAKE 3 TABLETS BY MOUTH EVERY DAY 01/27/23  Yes Marcine Matar, MD  amLODipine (NORVASC) 10 MG tablet Take 1 tablet (10 mg  total) by mouth daily. 06/25/22  Yes Marcine Matar, MD  atorvastatin (LIPITOR) 80 MG tablet TAKE 1 TABLET BY MOUTH DAILY AT 6 PM. 08/04/22  Yes Marcine Matar, MD  clopidogrel (PLAVIX) 75 MG tablet Take 1 tablet (75 mg total) by mouth daily. 06/25/22  Yes Marcine Matar, MD  doxazosin (CARDURA) 2 MG tablet Take 0.5 tablets (1 mg total) by mouth daily. 06/25/22  Yes Marcine Matar, MD  fexofenadine (ALLEGRA) 180 MG tablet Take 180 mg by mouth every 14 (fourteen) days.   Yes [provider]  furosemide (LASIX) 20 MG tablet Take 1 tablet (20 mg total) by mouth daily. 06/25/22  Yes Marcine Matar, MD  lidocaine-prilocaine (EMLA) cream Apply thin layer to affected area of skin daily PRN 02/28/23   Marcine Matar, MD  Semaglutide,0.25 or 0.5MG /DOS, (OZEMPIC, 0.25 OR 0.5 MG/DOSE,) 2 MG/3ML SOPN Inject 0.5 mg into the skin once a week. 12/27/22  Yes Marcine Matar, MD  valsartan-hydrochlorothiazide (DIOVAN-HCT) 80-12.5 MG tablet TAKE 1 TABLET BY MOUTH EVERY DAY 01/21/23  Yes Marcine Matar, MD  Accu-Chek FastClix Lancets MISC 1 each by Other route 3 (three) times daily. 01/18/19   Marcine Matar, MD  Agalsidase beta (FABRAZYME IV) Inject 115 mg into the vein every 14 (fourteen) days.     [provider]  albuterol (PROAIR HFA) 108 (90 Base) MCG/ACT inhaler Inhale 1 puff into the lungs every 6 (six) hours as needed for wheezing or shortness of breath. 03/18/21   Marcine Matar, MD  Continuous Glucose Receiver (FREESTYLE LIBRE 2 READER) DEVI Use to check blood sugar continuously. Change sensor once every 14 days. E11.59 12/31/22   Marcine Matar, MD  Continuous Glucose Sensor (FREESTYLE LIBRE SENSOR SYSTEM) MISC Use to check blood sugar continuously. Change sensor once every 14 days. E11.59 12/31/22   Marcine Matar, MD  fluticasone (FLONASE) 50 MCG/ACT nasal spray PLACE 1 SPRAY INTO BOTH NOSTRILS DAILY AS NEEDED FOR ALLERGIES. 09/05/20   Marcine Matar,  MD  glucose blood (ONETOUCH VERIO) test strip Use to check blood sugar three times daily. 07/14/22   Marcine Matar, MD  insulin aspart protamine - aspart (NOVOLOG MIX 70/30 FLEXPEN) (70-30) 100 UNIT/ML FlexPen Inject 28 Units into the skin 2 (two) times daily with a meal. 06/25/22   Marcine Matar, MD  Insulin Pen Needle (PX SHORTLENGTH PEN NEEDLES) 31G X 8 MM MISC 1 each by Does not apply route 2 (two) times daily before a meal. 04/11/22   Marcine Matar, MD  Prednisol Ace-Moxiflox-Bromfen 1-0.5-0.075 % SUSP Place 1 drop into the right eye 4 (four) times daily. Start 1 day before the surgery on 10/19/21 10/14/21   [provider]  Semaglutide, 1 MG/DOSE, 4 MG/3ML SOPN Inject 1 mg as directed once a week. 12/27/22   Marcine Matar, MD     Family History  Problem Relation Age of Onset   Stroke Mother  Fabry's disease Mother    Fabry's disease Brother    Colon cancer Neg Hx     Social History   Socioeconomic History   Marital status: Divorced    Spouse name: Not on file   Number of children: Not on file   Years of education: Not on file   Highest education level: Not on file  Occupational History   Not on file  Tobacco Use   Smoking status: Never   Smokeless tobacco: Never   Tobacco comments:    Never smoked. 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   Financial Resource Strain: Low Risk  (09/26/2022)   Overall Financial Resource Strain (CARDIA)    Difficulty of Paying Living Expenses: Not hard at all  Food Insecurity: No Food Insecurity (09/26/2022)   Hunger Vital Sign    Worried About Running Out of Food in the Last Year: Never true    Ran  Out of Food in the Last Year: Never true  Transportation Needs: No Transportation Needs (10/05/2022)   PRAPARE - Administrator, Civil Service (Medical): No    Lack of Transportation (Non-Medical): No  Physical Activity: Insufficiently Active (09/26/2022)   Exercise Vital Sign    Days of Exercise per Week: 3 days    Minutes of Exercise per Session: 30 min  Stress: No Stress Concern Present (09/26/2022)   [employer] of Occupational Health - Occupational Stress Questionnaire    Feeling of Stress : Not at all  Social Connections: Unknown (09/26/2022)   Social Connection and Isolation Panel [NHANES]    Frequency of Communication with Friends and Family: More than three times a week    Frequency of Social Gatherings with Friends and Family: Patient declined    Attends Religious Services: Not on Insurance claims handler of Clubs or Organizations: No    Attends Banker Meetings: Never    Marital Status: Divorced      Review of Systems: A 12 point ROS discussed and pertinent positives are indicated in the HPI above.  All other systems are negative.  Review of Systems  Constitutional:  Negative for activity change, fatigue and fever.  HENT:  Negative for congestion.   Respiratory:  Negative for cough, chest tightness and shortness of breath.   Cardiovascular:  Negative for chest pain.  Gastrointestinal:  Negative for abdominal pain.  Neurological:  Negative for speech difficulty and headaches.  Psychiatric/Behavioral:  Negative for behavioral problems and confusion.     Vital Signs: BP 126/85   Pulse 64   Temp 97.9 F (36.6 C) (Temporal)   Resp 17   Ht 5\' 10"  (1.778 m)   Wt 262 lb (118.8 kg)   SpO2 100%   BMI 37.59 kg/m     Physical Exam Constitutional:      Appearance: Normal appearance.  HENT:     Mouth/Throat:     Mouth: Mucous membranes are moist.  Cardiovascular:     Rate and Rhythm: Normal rate and regular rhythm.  Pulmonary:     Effort:  Pulmonary effort is normal.  Breath sounds: Normal breath sounds. No wheezing.  Abdominal:     Palpations: Abdomen is soft.  Musculoskeletal:        General: No swelling.  Skin:    General: Skin is warm and dry.  Neurological:     Mental Status: He is alert.  Psychiatric:        Mood and Affect: Mood normal.        Behavior: Behavior normal.     Imaging: No results found.  Labs:  CBC: Recent Labs    06/25/22 1532  WBC 7.3  HGB 15.3  HCT 45.4  PLT 178    COAGS: No results for input(s): "INR", "APTT" in the last 8760 hours.  BMP: Recent Labs    06/25/22 1532  NA 140  K 4.5  CL 100  CO2 22  GLUCOSE 410*  BUN 30*  CALCIUM 9.6  CREATININE 2.26*    LIVER FUNCTION TESTS: Recent Labs    06/25/22 1532  BILITOT 0.9  AST 33  ALT 29  ALKPHOS 132*  PROT 7.5  ALBUMIN 3.9    TUMOR MARKERS: No results for input(s): "AFPTM", "CEA", "CA199", "CHROMGRNA" in the last 8760 hours.  Assessment and Plan: 9/25 Port-A-Cathter placement in IR  Farazyme treatments for CKD, 3BFabry's disorder    Risks and benefits of image guided port-a-catheter placement was discussed with the patient including, but not limited to bleeding, infection, pneumothorax, or fibrin sheath development and need for additional procedures.  All of the patient's questions were answered, patient is agreeable to proceed. Consent signed and in chart.  Thank you for this interesting consult.  I greatly enjoyed meeting ANTOWAN VINYARD and look forward to participating in their care.  A copy of this report was sent to the requesting provider on this date.  Electronically Signed: Ardith Dark, NP 03/02/2023, 12:35 PM   I spent a total of  30 Minutes   in face to face in clinical consultation, greater than 50% of which was counseling/coordinating care for Port-A-Catheter placement

## 2023-03-02 NOTE — Procedures (Signed)
Interventional Radiology Procedure Note  Procedure: Placement of a right IJ approach single lumen AngioDynamics SmartPort.  Tip is positioned at the superior cavoatrial junction and catheter is ready for immediate use.  Complications: No immediate Recommendations:  - Ok to shower tomorrow - Do not submerge for 7 days - Routine line care   Signed,  Sterling Big, MD

## 2023-03-02 NOTE — Sedation Documentation (Signed)
Pt intermittently breathing through mouth, unable to obtain etCO2. Pt color is appropriate and other VS remain WDL

## 2023-03-04 ENCOUNTER — Encounter: Payer: Self-pay | Admitting: Internal Medicine

## 2023-03-11 DIAGNOSIS — E7521 Fabry (-Anderson) disease: Secondary | ICD-10-CM | POA: Diagnosis not present

## 2023-03-18 DIAGNOSIS — M109 Gout, unspecified: Secondary | ICD-10-CM | POA: Diagnosis not present

## 2023-03-18 DIAGNOSIS — I129 Hypertensive chronic kidney disease with stage 1 through stage 4 chronic kidney disease, or unspecified chronic kidney disease: Secondary | ICD-10-CM | POA: Diagnosis not present

## 2023-03-18 DIAGNOSIS — N1831 Chronic kidney disease, stage 3a: Secondary | ICD-10-CM | POA: Diagnosis not present

## 2023-03-18 DIAGNOSIS — N521 Erectile dysfunction due to diseases classified elsewhere: Secondary | ICD-10-CM | POA: Diagnosis not present

## 2023-03-18 DIAGNOSIS — I890 Lymphedema, not elsewhere classified: Secondary | ICD-10-CM | POA: Diagnosis not present

## 2023-03-18 DIAGNOSIS — I639 Cerebral infarction, unspecified: Secondary | ICD-10-CM | POA: Diagnosis not present

## 2023-03-18 DIAGNOSIS — E349 Endocrine disorder, unspecified: Secondary | ICD-10-CM | POA: Diagnosis not present

## 2023-03-18 DIAGNOSIS — E118 Type 2 diabetes mellitus with unspecified complications: Secondary | ICD-10-CM | POA: Diagnosis not present

## 2023-03-18 DIAGNOSIS — I441 Atrioventricular block, second degree: Secondary | ICD-10-CM | POA: Diagnosis not present

## 2023-03-18 DIAGNOSIS — E7521 Fabry (-Anderson) disease: Secondary | ICD-10-CM | POA: Diagnosis not present

## 2023-03-21 ENCOUNTER — Ambulatory Visit (INDEPENDENT_AMBULATORY_CARE_PROVIDER_SITE_OTHER): Payer: Medicare HMO

## 2023-03-21 ENCOUNTER — Ambulatory Visit: Payer: Self-pay | Admitting: Licensed Clinical Social Worker

## 2023-03-21 DIAGNOSIS — I441 Atrioventricular block, second degree: Secondary | ICD-10-CM

## 2023-03-22 DIAGNOSIS — E119 Type 2 diabetes mellitus without complications: Secondary | ICD-10-CM | POA: Diagnosis not present

## 2023-03-22 DIAGNOSIS — H02423 Myogenic ptosis of bilateral eyelids: Secondary | ICD-10-CM | POA: Diagnosis not present

## 2023-03-22 DIAGNOSIS — E7521 Fabry (-Anderson) disease: Secondary | ICD-10-CM | POA: Diagnosis not present

## 2023-03-22 DIAGNOSIS — H2513 Age-related nuclear cataract, bilateral: Secondary | ICD-10-CM | POA: Diagnosis not present

## 2023-03-22 DIAGNOSIS — H02831 Dermatochalasis of right upper eyelid: Secondary | ICD-10-CM | POA: Diagnosis not present

## 2023-03-22 DIAGNOSIS — I639 Cerebral infarction, unspecified: Secondary | ICD-10-CM | POA: Diagnosis not present

## 2023-03-22 DIAGNOSIS — H40023 Open angle with borderline findings, high risk, bilateral: Secondary | ICD-10-CM | POA: Diagnosis not present

## 2023-03-22 DIAGNOSIS — H1045 Other chronic allergic conjunctivitis: Secondary | ICD-10-CM | POA: Diagnosis not present

## 2023-03-22 DIAGNOSIS — H35033 Hypertensive retinopathy, bilateral: Secondary | ICD-10-CM | POA: Diagnosis not present

## 2023-03-22 DIAGNOSIS — Z794 Long term (current) use of insulin: Secondary | ICD-10-CM | POA: Diagnosis not present

## 2023-03-22 DIAGNOSIS — H02834 Dermatochalasis of left upper eyelid: Secondary | ICD-10-CM | POA: Diagnosis not present

## 2023-03-22 LAB — LAB REPORT - SCANNED: EGFR: 39

## 2023-03-22 NOTE — Patient Outreach (Signed)
Care Coordination   Follow Up Visit Note   03/21/2023 Name: [name] MRN: [id number] DOB: 11-Jan-1961  [name] is a 62 y.o. year old male who sees Marcine Matar, MD for primary care. I spoke with  [name] by phone today.  What matters to the patients health and wellness today?  Symptom Managment    Goals Addressed             This Visit's Progress    Obtain Supportive Resources   On track    Activities and task to complete in order to accomplish goals.   Keep all upcoming appointment discussed today Continue with compliance of taking medication prescribed by Doctor  Utilize strategies discussed to assist with goals (weight loss/meal prep)  Continue utilizing healthy coping skills to promote self-care                    SDOH assessments and interventions completed:  No     Care Coordination Interventions:  Yes, provided  Interventions Today    Flowsheet Row Most Recent Value  Chronic Disease   Chronic disease during today's visit Diabetes, Hypertension (HTN), Chronic Kidney Disease/End Stage Renal Disease (ESRD)  General Interventions   General Interventions Discussed/Reviewed General Interventions Reviewed, Doctor Visits  [Procedure went well. Endorsed a short recovery period. Continues to receive transportation through [facility] [place]]  Doctor Visits Discussed/Reviewed Doctor Visits Reviewed  Mental Health Interventions   Mental Health Discussed/Reviewed Mental Health Reviewed, Coping Strategies  Nutrition Interventions   Nutrition Discussed/Reviewed Nutrition Reviewed  Pharmacy Interventions   Pharmacy Dicussed/Reviewed Pharmacy Topics Reviewed, Medication Adherence  Safety Interventions   Safety Discussed/Reviewed Safety Reviewed       Follow up plan: Follow up call scheduled for 05/09/23    Encounter Outcome:  Patient Visit Completed   Jenel Lucks, MSW, LCSW [facility] Care Management [facility]  [facility] [email or web address] Phone [phone] 5:00 PM

## 2023-03-22 NOTE — Patient Instructions (Signed)
Visit Information  Thank you for taking time to visit with me today. Please don't hesitate to contact me if I can be of assistance to you.   Following are the goals we discussed today:   Goals Addressed             This Visit's Progress    Obtain Supportive Resources   On track    Activities and task to complete in order to accomplish goals.   Keep all upcoming appointment discussed today Continue with compliance of taking medication prescribed by Doctor  Utilize strategies discussed to assist with goals (weight loss/meal prep)  Continue utilizing healthy coping skills to promote self-care                    Our next appointment is by telephone on 12/02 at 10 AM  Please call the care guide team at [phone] if you need to cancel or reschedule your appointment.   If you are experiencing a Mental Health or Behavioral Health Crisis or need someone to talk to, please call the Suicide and Crisis Lifeline: 988 call 911   Patient verbalizes understanding of instructions and care plan provided today and agrees to view in MyChart. Active MyChart status and patient understanding of how to access instructions and care plan via MyChart confirmed with patient.     Jenel Lucks, MSW, LCSW [facility] Care Management [place]  [facility] [email or web address] Phone [phone] 5:00 PM

## 2023-03-23 LAB — CUP PACEART REMOTE DEVICE CHECK
Battery Remaining Longevity: 41 mo
Battery Remaining Percentage: 48 %
Battery Voltage: 2.98 V
Brady Statistic AP VP Percent: 80 %
Brady Statistic AP VS Percent: 1 %
Brady Statistic AS VP Percent: 20 %
Brady Statistic AS VS Percent: 1 %
Brady Statistic RA Percent Paced: 79 %
Brady Statistic RV Percent Paced: 99 %
Date Time Interrogation Session: 20241014162511
Implantable Lead Connection Status: 753985
Implantable Lead Connection Status: 753985
Implantable Lead Implant Date: 20120907
Implantable Lead Implant Date: 20120907
Implantable Lead Location: 753859
Implantable Lead Location: 753860
Implantable Pulse Generator Implant Date: 20201215
Lead Channel Impedance Value: 310 Ohm
Lead Channel Impedance Value: 390 Ohm
Lead Channel Pacing Threshold Amplitude: 0.75 V
Lead Channel Pacing Threshold Amplitude: 2 V
Lead Channel Pacing Threshold Pulse Width: 0.5 ms
Lead Channel Pacing Threshold Pulse Width: 0.8 ms
Lead Channel Sensing Intrinsic Amplitude: 12 mV
Lead Channel Sensing Intrinsic Amplitude: 5 mV
Lead Channel Setting Pacing Amplitude: 1.75 V
Lead Channel Setting Pacing Amplitude: 2.25 V
Lead Channel Setting Pacing Pulse Width: 0.8 ms
Lead Channel Setting Sensing Sensitivity: 4 mV
Pulse Gen Model: 2272
Pulse Gen Serial Number: 9187005

## 2023-03-25 DIAGNOSIS — E7521 Fabry (-Anderson) disease: Secondary | ICD-10-CM | POA: Diagnosis not present

## 2023-04-07 NOTE — Progress Notes (Signed)
Remote pacemaker transmission.   

## 2023-04-08 ENCOUNTER — Encounter: Payer: Self-pay | Admitting: Internal Medicine

## 2023-04-08 DIAGNOSIS — E7521 Fabry (-Anderson) disease: Secondary | ICD-10-CM | POA: Diagnosis not present

## 2023-04-13 ENCOUNTER — Encounter: Payer: Self-pay | Admitting: Internal Medicine

## 2023-04-13 ENCOUNTER — Other Ambulatory Visit: Payer: Self-pay | Admitting: Internal Medicine

## 2023-04-13 DIAGNOSIS — G463 Brain stem stroke syndrome: Secondary | ICD-10-CM

## 2023-04-13 DIAGNOSIS — I5032 Chronic diastolic (congestive) heart failure: Secondary | ICD-10-CM

## 2023-04-13 DIAGNOSIS — I152 Hypertension secondary to endocrine disorders: Secondary | ICD-10-CM

## 2023-04-14 NOTE — Telephone Encounter (Signed)
Requested Prescriptions  Pending Prescriptions Disp Refills   atorvastatin (LIPITOR) 80 MG tablet [Pharmacy Med Name: ATORVASTATIN 80 MG TABLET] 90 tablet 0    Sig: TAKE 1 TABLET BY MOUTH DAILY AT 6 PM.     Cardiovascular:  Antilipid - Statins Failed - 04/13/2023  2:51 PM      Failed - Lipid Panel in normal range within the last 12 months    Cholesterol, Total  Date Value Ref Range Status  10/26/2021 168 100 - 199 mg/dL Final   LDL Chol Calc (NIH)  Date Value Ref Range Status  10/26/2021 91 0 - 99 mg/dL Final   HDL  Date Value Ref Range Status  10/26/2021 62 >39 mg/dL Final   Triglycerides  Date Value Ref Range Status  10/26/2021 80 0 - 149 mg/dL Final         Passed - Patient is not pregnant      Passed - Valid encounter within last 12 months    Recent Outpatient Visits           3 months ago Type 2 diabetes mellitus with obesity (HCC)   [place] [facility] Of [place]. [facility] Jonah Blue B, MD   7 months ago Type 2 diabetes mellitus with other circulatory complication, with long-term current use of insulin (HCC)   [place] [facility] - [facility] Dept Of [place]. [facility] Jonah Blue B, MD   9 months ago Type 2 diabetes mellitus with other circulatory complication, with long-term current use of insulin (HCC)   [place] [facility] - A Dept Of [place]. [facility] Marcine Matar, MD   1 year ago Type 2 diabetes mellitus with peripheral neuropathy [facility])   [place] [facility] - A Dept Of [place]. [facility] Jonah Blue B, MD   1 year ago Type 2 diabetes mellitus with other circulatory complication, with long-term current use of insulin (HCC)   [place] [facility] - A Dept Of . [facility] Marcine Matar, MD       Future Appointments             In 2 weeks Marcine Matar, MD [facility] [place] - A Dept Of  [place]igh[place] Bridegroom. [facility]             furosemide (LASIX) 20 MG tablet [Pharmacy Med Name: FUROSEMIDE 20 MG TABLET] 90 tablet 0    Sig: TAKE 1 TABLET BY MOUTH EVERY DAY     Cardiovascular:  Diuretics - Loop Failed - 04/13/2023  2:51 PM      Failed - K in normal range and within 180 days    Potassium  Date Value Ref Range Status  06/25/2022 4.5 3.5 - 5.2 mmol/L Final         Failed - Ca in normal range and within 180 days    Calcium  Date Value Ref Range Status  06/25/2022 9.6 8.6 - 10.2 mg/dL Final   Calcium, Ion  Date Value Ref Range Status  05/22/2019 1.16 1.15 - 1.40 mmol/L Final         Failed - Na in normal range and within 180 days    Sodium  Date Value Ref Range Status  06/25/2022 140 134 - 144 mmol/L Final         Failed - Cr in normal range and within  180 days    Creat  Date Value Ref Range Status  11/07/2014 1.28 0.50 - 1.35 mg/dL Final   Creatinine, Ser  Date Value Ref Range Status  06/25/2022 2.26 (H) 0.76 - 1.27 mg/dL Final   Creatinine, POC  Date Value Ref Range Status  10/12/2022 57.3 mg/dL Final    Comment:    Abstracted by HIM   Creatinine, Urine  Date Value Ref Range Status  08/30/2014 72.9 mg/dL Final    Comment:    No reference range established.         Failed - Cl in normal range and within 180 days    Chloride  Date Value Ref Range Status  06/25/2022 100 96 - 106 mmol/L Final         Failed - Mg Level in normal range and within 180 days    Magnesium  Date Value Ref Range Status  07/30/2013 1.9 1.5 - 2.5 mg/dL Final         Failed - Last BP in normal range    BP Readings from Last 1 Encounters:  03/02/23 (!) 130/93         Passed - Valid encounter within last 6 months    Recent Outpatient Visits           3 months ago Type 2 diabetes mellitus with obesity (HCC)   [place] [facility] Of [place]. [facility] Jonah Blue B, MD   7 months ago Type 2 diabetes mellitus with other  circulatory complication, with long-term current use of insulin (HCC)   [place] [facility] - A Dept Of [place]. [facility] Jonah Blue B, MD   9 months ago Type 2 diabetes mellitus with other circulatory complication, with long-term current use of insulin (HCC)   [place] [facility] - A Dept Of [place]. [facility] Marcine Matar, MD   1 year ago Type 2 diabetes mellitus with peripheral neuropathy [facility])   [place] [facility] - A Dept Of [place]. [facility] Jonah Blue B, MD   1 year ago Type 2 diabetes mellitus with other circulatory complication, with long-term current use of insulin (HCC)   [place] [facility] - A Dept Of [place]. [facility] Marcine Matar, MD       Future Appointments             In 2 weeks Marcine Matar, MD [facility] [place] - A Dept Of [place] Bridegroom. [facility]

## 2023-04-22 DIAGNOSIS — E7521 Fabry (-Anderson) disease: Secondary | ICD-10-CM | POA: Diagnosis not present

## 2023-04-26 DIAGNOSIS — Z008 Encounter for other general examination: Secondary | ICD-10-CM | POA: Diagnosis not present

## 2023-04-29 ENCOUNTER — Other Ambulatory Visit: Payer: Self-pay

## 2023-04-29 ENCOUNTER — Encounter: Payer: Self-pay | Admitting: Internal Medicine

## 2023-04-29 ENCOUNTER — Ambulatory Visit: Payer: Medicare HMO | Attending: Internal Medicine | Admitting: Internal Medicine

## 2023-04-29 VITALS — BP 105/73 | HR 78 | Temp 98.0°F | Ht 70.0 in | Wt 264.0 lb

## 2023-04-29 DIAGNOSIS — E1169 Type 2 diabetes mellitus with other specified complication: Secondary | ICD-10-CM

## 2023-04-29 DIAGNOSIS — Z6837 Body mass index (BMI) 37.0-37.9, adult: Secondary | ICD-10-CM | POA: Diagnosis not present

## 2023-04-29 DIAGNOSIS — N1832 Chronic kidney disease, stage 3b: Secondary | ICD-10-CM | POA: Diagnosis not present

## 2023-04-29 DIAGNOSIS — Z7984 Long term (current) use of oral hypoglycemic drugs: Secondary | ICD-10-CM

## 2023-04-29 DIAGNOSIS — I152 Hypertension secondary to endocrine disorders: Secondary | ICD-10-CM

## 2023-04-29 DIAGNOSIS — E1159 Type 2 diabetes mellitus with other circulatory complications: Secondary | ICD-10-CM

## 2023-04-29 DIAGNOSIS — Z23 Encounter for immunization: Secondary | ICD-10-CM

## 2023-04-29 DIAGNOSIS — E669 Obesity, unspecified: Secondary | ICD-10-CM

## 2023-04-29 DIAGNOSIS — E7521 Fabry (-Anderson) disease: Secondary | ICD-10-CM

## 2023-04-29 LAB — POCT GLYCOSYLATED HEMOGLOBIN (HGB A1C): HbA1c, POC (controlled diabetic range): 7.1 % — AB (ref 0.0–7.0)

## 2023-04-29 LAB — GLUCOSE, POCT (MANUAL RESULT ENTRY): POC Glucose: 145 mg/dL — AB (ref 70–99)

## 2023-04-29 MED ORDER — SEMAGLUTIDE (2 MG/DOSE) 8 MG/3ML ~~LOC~~ SOPN
2.0000 mg | PEN_INJECTOR | SUBCUTANEOUS | 4 refills | Status: DC
Start: 2023-04-29 — End: 2023-08-03
  Filled 2023-04-29: qty 3, 28d supply, fill #0

## 2023-04-29 NOTE — Progress Notes (Signed)
Patient ID: ANREW BUCHKO, male    DOB: [birth year]  MRN: [id number]  CC: Diabetes (DM f/u. /No questions / concerns/Yes to flu vax)   Subjective: [name] is a 62 y.o. male who presents for chronic ds management. His concerns today include:  Patient with history of diabetes type 2 with microalbuminuria, diastolic CHF with pacemaker (secconary to AV block with syncopy), CKD stage III, HOCM, HTN, HL, CVA, lymphedema. 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    DM: Results for orders placed or performed in visit on 04/29/23  POCT glycosylated hemoglobin (Hb A1C)  Result Value Ref Range   Hemoglobin A1C     HbA1c POC (<> result, manual entry)     HbA1c, POC (prediabetic range)     HbA1c, POC (controlled diabetic range) 7.1 (A) 0.0 - 7.0 %  POCT glucose (manual entry)  Result Value Ref Range   POC Glucose 145 (A) 70 - 99 mg/dl   *Note: Due to a large number of results and/or encounters for the requested time period, some results have not been displayed. A complete set of results can be found in Results Review.  Should be on NovoLog 70/30 24 units twice a day and Ozempic 1 mg once a week.  He self stopped Novolog around August because he though BS were doing good.  Tolerating Ozempic but reports hard stools.  No abdomen pain or vomiting.  Weight is unchanged since last visit. Feels he can do better with controlling appetite.  Has CGM Libre 2.  Forgot to bring reader but has log with readings 3x/day.  A.m range 120's-140s and lunch 133-160 Eating habits: likes to snack on grapes and water mellon.  Eats Healthy Choice dinner.  CKD 3b/Fabry's: Recently had port placed for his enzyme infusion.  He is happy about this Most recent GFR was 39 with creatinine of 1.91.  GFR earlier this yr was 32.  HTN/diastolic CHF:  compliance with amlodipine 10 mg daily, doxazosin 2 mg half a tablet daily, furosemide 20 mg daily, Diovan/HCTZ 80/12.5 mg daily  Limit salt in  the foods.  HM: due for flu shot/COVID booster Patient Active Problem List   Diagnosis Date Noted   Tetanus, diphtheria, and acellular pertussis (Tdap) vaccination declined 08/24/2022   Atrial flutter, unspecified type (HCC) 06/26/2022   Post-nasal drip 10/26/2021   Chronic sinusitis 07/01/2021   Contact dermatitis and other eczema 07/01/2021   Edema 07/01/2021   Other and unspecified hyperlipidemia 07/01/2021   Proteinuria 07/01/2021   Macroalbuminuric diabetic nephropathy (HCC) 11/08/2020   Diabetes mellitus (HCC) 11/07/2020   Hypertension associated with stage 3a chronic kidney disease due to type 2 diabetes mellitus (HCC) 11/07/2020   Influenza vaccine needed 07/08/2020   Spinal stenosis of lumbar region without neurogenic claudication 07/08/2020   Degenerative spondylolisthesis 05/20/2020   Acute left-sided low back pain with left-sided sciatica 05/08/2020   Reactive airway disease 01/24/2020   Pain due to onychomycosis of toenails of both feet 10/17/2019   Morbid obesity (HCC) 08/23/2019   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   CVA (cerebral vascular accident) (HCC) 10/20/2018   UTI (urinary tract infection) 10/19/2018   Upper airway cough syndrome 08/01/2018   Lymphedema 10/06/2016   Gout, arthritis    Anorectal fistula 06/23/2015   Onychomycosis of toenail 09/26/2014   Type 2 diabetes mellitus with obesity (HCC)    Iron deficiency anemia 07/29/2014   CKD (chronic kidney disease), stage III (HCC)    Chronic  diastolic HF (heart failure) (HCC) 03/27/2013   Peripheral edema 07/20/2012   Obstructive sleep apnea, suspected 06/21/2012   Pacemaker-St.Jude 02/23/2012   Hypertension 08/09/2011   Mobitz type II atrioventricular block 05/19/2011   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 102 each 12   acetaminophen  (TYLENOL) 325 MG tablet Take 1-2 tablets (325-650 mg total) by mouth every 4 (four) hours as needed for mild pain.     Agalsidase beta (FABRAZYME IV) Inject 115 mg into the vein every 14 (fourteen) days.      albuterol (PROAIR HFA) 108 (90 Base) MCG/ACT inhaler Inhale 1 puff into the lungs every 6 (six) hours as needed for wheezing or shortness of breath. 8.5 each 1   allopurinol (ZYLOPRIM) 100 MG tablet TAKE 3 TABLETS BY MOUTH EVERY DAY 270 tablet 1   amLODipine (NORVASC) 10 MG tablet Take 1 tablet (10 mg total) by mouth daily. 90 tablet 1   atorvastatin (LIPITOR) 80 MG tablet TAKE 1 TABLET BY MOUTH DAILY AT 6 PM. 90 tablet 0   clopidogrel (PLAVIX) 75 MG tablet Take 1 tablet (75 mg total) by mouth daily. 90 tablet 1   Continuous Glucose Receiver (FREESTYLE LIBRE 2 READER) DEVI Use to check blood sugar continuously. Change sensor once every 14 days. E11.59 1 each 0   Continuous Glucose Sensor (FREESTYLE LIBRE SENSOR SYSTEM) MISC Use to check blood sugar continuously. Change sensor once every 14 days. E11.59 2 each 12   doxazosin (CARDURA) 2 MG tablet Take 0.5 tablets (1 mg total) by mouth daily. 45 tablet 1   fexofenadine (ALLEGRA) 180 MG tablet Take 180 mg by mouth every 14 (fourteen) days.     fluticasone (FLONASE) 50 MCG/ACT nasal spray PLACE 1 SPRAY INTO BOTH NOSTRILS DAILY AS NEEDED FOR ALLERGIES. 48 mL 1   furosemide (LASIX) 20 MG tablet TAKE 1 TABLET BY MOUTH EVERY DAY 90 tablet 0   glucose blood (ONETOUCH VERIO) test strip Use to check blood sugar three times daily. 100 each 2   insulin aspart protamine - aspart (NOVOLOG MIX 70/30 FLEXPEN) (70-30) 100 UNIT/ML FlexPen Inject 28 Units into the skin 2 (two) times daily with a meal. 15 mL 6   Insulin Pen Needle (PX SHORTLENGTH PEN NEEDLES) 31G X 8 MM MISC 1 each by Does not apply route 2 (two) times daily before a meal. 100 each 12   lidocaine-prilocaine (EMLA) cream Apply thin layer to affected area of skin daily PRN 1 g 0   Prednisol  Ace-Moxiflox-Bromfen 1-0.5-0.075 % SUSP Place 1 drop into the right eye 4 (four) times daily. Start 1 day before the surgery on 10/19/21     Semaglutide, 1 MG/DOSE, 4 MG/3ML SOPN Inject 1 mg as directed once a week.     Semaglutide,0.25 or 0.5MG /DOS, (OZEMPIC, 0.25 OR 0.5 MG/DOSE,) 2 MG/3ML SOPN Inject 0.5 mg into the skin once a week. 3 mL 1   valsartan-hydrochlorothiazide (DIOVAN-HCT) 80-12.5 MG tablet TAKE 1 TABLET BY MOUTH EVERY DAY 90 tablet 1   No current facility-administered medications on file prior to visit.    Allergies  Allergen Reactions   Lisinopril Anaphylaxis   Shellfish Allergy Hives and Swelling    Social History   Socioeconomic History   Marital status: Divorced    Spouse name: Not on file   Number of children: Not on  file   Years of education: Not on file   Highest education level: Not on file  Occupational History   Not on file  Tobacco Use   Smoking status: Never   Smokeless tobacco: Never   Tobacco comments:    Never smoked. 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   Financial Resource Strain: Low Risk  (04/29/2023)   Overall Financial Resource Strain (CARDIA)    Difficulty of Paying Living Expenses: Not hard at all  Food Insecurity: No Food Insecurity (04/29/2023)   Hunger Vital Sign    Worried About Running Out of Food in the Last Year: Never true    Ran Out of Food in the Last Year: Never true  Transportation Needs: No Transportation Needs (04/29/2023)   PRAPARE - Administrator, Civil Service (Medical): No    Lack of Transportation (Non-Medical): No  Physical Activity: Insufficiently Active (04/29/2023)   Exercise  Vital Sign    Days of Exercise per Week: 3 days    Minutes of Exercise per Session: 30 min  Stress: No Stress Concern Present (04/29/2023)   [employer] of Occupational Health - Occupational Stress Questionnaire    Feeling of Stress : Not at all  Social Connections: Socially Isolated (04/29/2023)   Social Connection and Isolation Panel [NHANES]    Frequency of Communication with Friends and Family: More than three times a week    Frequency of Social Gatherings with Friends and Family: Never    Attends Religious Services: Never    Database administrator or Organizations: No    Attends Banker Meetings: Never    Marital Status: Divorced  Catering manager Violence: Not At Risk (04/29/2023)   Humiliation, Afraid, Rape, and Kick questionnaire    Fear of Current or Ex-Partner: No    Emotionally Abused: No    Physically Abused: No    Sexually Abused: No    Family History  Problem Relation Age of Onset   Stroke Mother    Fabry's disease Mother    Fabry's disease Brother    Colon cancer Neg Hx     Past Surgical History:  Procedure Laterality Date   CARDIAC CATHETERIZATION  03-12-2011  DR Verdis Prime   HYPERTROPHIC CARDIOMYOPATHY WITH LV CAVITY  APPEARANCE CONSISTANT WITH SIGNIFICANT APICAL HYPERTROPHY/ NORMAL LVSF / EF 55%/ EVIDENCE OF DIASTOLIC DYSFUNCTION WITH EDP OF 23-79mmHg AFTER A-WAVE/ NORMAL CORONARY ARTERIES   EVALUATION UNDER ANESTHESIA WITH FISTULECTOMY N/A 06/23/2015   Procedure: EXAM UNDER ANESTHESIA WITH POSSIBLE FISTULOTOMY;  Surgeon: Jimmye Norman, MD;  Location: [facility] OR;  Service: General;  Laterality: N/A;   HERNIA REPAIR  06/08/2011   Umbilical Hernia Repair   INCISION AND DRAINAGE PERIRECTAL ABSCESS Left 07/29/2014   Procedure: IRRIGATION AND DEBRIDEMENT PERIRECTAL ABSCESS;  Surgeon: Frederik Schmidt, MD;  Location: [facility] OR;  Service: General;  Laterality: Left;  Prone position   INSERT / REPLACE / REMOVE PACEMAKER  02/12/2011   SJM implanted by Dr Johney Frame for  Mobitz II second degree AV block and syncope   IR IMAGING GUIDED PORT INSERTION  03/02/2023   IRRIGATION AND DEBRIDEMENT  ABSCESS N/A 07/27/2013   Procedure: IRRIGATION AND DEBRIDEMENT OF SKIN, SOFT TISSUE AND MUSCLES OF UPPER BACK (11X19X4cm) WITH 10 BLADE AND PULSATILE LAVAGE ;  Surgeon: Atilano Ina, MD;  [facility];  Service: General;  Laterality: N/A;   PPM GENERATOR CHANGEOUT N/A 05/22/2019   Procedure: PPM GENERATOR CHANGEOUT;  Surgeon: Hillis Range, MD;  Location: [facility] INVASIVE CV LAB;  Service: Cardiovascular;  Laterality: N/A;   UMBILICAL HERNIA REPAIR  03/22/2012   Procedure: HERNIA REPAIR UMBILICAL ADULT;  Surgeon: Romie Levee, MD;  Location: [facility] ORS;  Service: General;  Laterality: N/A;  Umbilical Hernia Repair     ROS: Review of Systems Negative except as stated above  PHYSICAL EXAM: BP 105/73 (BP Location: Left Arm, Patient Position: Sitting, Cuff Size: Large)   Pulse 78   Temp 98 F (36.7 C) (Oral)   Ht 5\' 10"  (1.778 m)   Wt 264 lb (119.7 kg)   SpO2 99%   BMI 37.88 kg/m   Wt Readings from Last 3 Encounters:  04/29/23 264 lb (119.7 kg)  03/02/23 262 lb (118.8 kg)  12/27/22 264 lb (119.7 kg)    Physical Exam  General appearance - alert, well appearing, older African-American male and in no distress.  Has cane with him. Mental status - normal mood, behavior, speech, dress, motor activity, and thought processes Neck - supple, no significant adenopathy Chest - clear to auscultation, no wheezes, rales or rhonchi, symmetric air entry Heart - normal rate, regular rhythm, normal S1, S2, no murmurs, rubs, clicks or gallops Extremities -he has lymphedema in the legs.  He is wearing his compression socks.     Latest Ref Rng & Units 06/25/2022    3:32 PM 10/26/2021    9:45 AM 02/23/2021   10:26 AM  CMP  Glucose 70 - 99 mg/dL 161  096  045   BUN 8 - 27 mg/dL 30  20  27    Creatinine 0.76 - 1.27 mg/dL 4.09  8.11  9.14   Sodium 134 - 144 mmol/L 140  149  146    Potassium 3.5 - 5.2 mmol/L 4.5  4.3  4.7   Chloride 96 - 106 mmol/L 100  109  107   CO2 20 - 29 mmol/L 22  22  24    Calcium 8.6 - 10.2 mg/dL 9.6  9.8  9.7   Total Protein 6.0 - 8.5 g/dL 7.5  7.8    Total Bilirubin 0.0 - 1.2 mg/dL 0.9  0.5    Alkaline Phos 44 - 121 IU/L 132  138    AST 0 - 40 IU/L 33  33    ALT 0 - 44 IU/L 29  32     Lipid Panel     Component Value Date/Time   CHOL 168 10/26/2021 0945   TRIG 80 10/26/2021 0945   HDL 62 10/26/2021 0945   CHOLHDL 2.7 10/26/2021 0945   CHOLHDL 4.4 10/20/2018 0324   VLDL 18 10/20/2018 0324   LDLCALC 91 10/26/2021 0945    CBC    Component Value Date/Time   WBC 7.3 06/25/2022 1532   WBC 7.0 05/22/2019 1201   RBC 5.21 06/25/2022 1532   RBC 5.29 05/22/2019 1201   HGB 15.3 06/25/2022 1532   HCT 45.4 06/25/2022 1532   PLT 178 06/25/2022 1532   MCV 87 06/25/2022 1532   MCH 29.4 06/25/2022 1532   MCH 29.3 05/22/2019 1201   MCHC 33.7 06/25/2022 1532   MCHC 32.1 05/22/2019 1201   RDW  12.1 06/25/2022 1532   LYMPHSABS 2.9 08/23/2019 1152   MONOABS 1.3 (H) 10/24/2018 0515   EOSABS 0.3 08/23/2019 1152   BASOSABS 0.1 08/23/2019 1152    ASSESSMENT AND PLAN: 1. Type 2 diabetes mellitus with obesity (HCC) A1c close to goal. He has been off his NovoLog 70/30 for several months.  We agreed to have him stay off of it.  We will increase the Ozempic to 2 mg once a week.  Again went over possible side effects to watch out for including abdominal pain, vomiting, severe diarrhea or constipation.  For his constipation I recommend using MiraLAX over-the-counter as needed. - POCT glycosylated hemoglobin (Hb A1C) - POCT glucose (manual entry) - Semaglutide, 2 MG/DOSE, 8 MG/3ML SOPN; Inject 2 mg as directed once a week.  Dispense: 3 mL; Refill: 4  2. Hypertension associated with type 2 diabetes mellitus (HCC) At goal.  Continue amlodipine 10 mg daily, doxazosin 2 mg half a tablet daily, furosemide 20 mg daily, Diovan/HCTZ 80/12.5 mg daily   3.  Hyperlipidemia associated with type 2 diabetes mellitus (HCC) Continue atorvastatin 80 mg daily.  4. Stage 3b chronic kidney disease (HCC) Stable.  Followed by nephrology.  5. 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HCC) He finally was able to get the port placed so that he does not have to be stuck every time he has his enzyme infusion.  Followed by nephrology.  6. Influenza vaccine needed Given today.  Patient was given the opportunity to ask questions.  Patient verbalized understanding of the plan and was able to repeat key elements of the plan.   This documentation was completed using Paediatric nurse.  Any transcriptional errors are unintentional.  Orders Placed This Encounter  Procedures   POCT glycosylated hemoglobin (Hb A1C)   POCT glucose (manual entry)     Requested Prescriptions    No prescriptions requested or ordered in this encounter    No follow-ups on file.  Jonah Blue, MD, FACP

## 2023-04-29 NOTE — Patient Instructions (Signed)
Stop NovoLog 70/30. Increase Ozempic to 2 mg once a week. Use MiraLAX as needed for constipation.

## 2023-05-09 ENCOUNTER — Telehealth: Payer: Self-pay | Admitting: Licensed Clinical Social Worker

## 2023-05-09 ENCOUNTER — Encounter: Payer: Self-pay | Admitting: Licensed Clinical Social Worker

## 2023-05-09 DIAGNOSIS — E7521 Fabry (-Anderson) disease: Secondary | ICD-10-CM | POA: Diagnosis not present

## 2023-05-09 NOTE — Patient Outreach (Signed)
  Care Coordination   05/09/2023 Name: [name] MRN: [id number] DOB: 07-Feb-1961   Care Coordination Outreach Attempts:  An unsuccessful telephone outreach was attempted for a scheduled appointment today.  Follow Up Plan:  Additional outreach attempts will be made to offer the patient care coordination information and services.   Encounter Outcome:  No Answer   Care Coordination Interventions:  No, not indicated    Jenel Lucks, MSW, LCSW [facility] Care Management [place]  [email or web address] Phone [phone] 2:24 PM

## 2023-05-10 ENCOUNTER — Telehealth: Payer: Self-pay | Admitting: Licensed Clinical Social Worker

## 2023-05-10 NOTE — Telephone Encounter (Signed)
Copied from CRM [id number]. Topic: Appointment Scheduling - Scheduling Inquiry for Clinic >> May 09, 2023  1:52 PM Runell Gess P wrote: Reason for CRM: pt missed a call from Jenel Lucks this morning and he deleted the telephone number.  Please have her call him back.

## 2023-05-11 ENCOUNTER — Other Ambulatory Visit: Payer: Self-pay

## 2023-05-24 ENCOUNTER — Encounter: Payer: Self-pay | Admitting: Internal Medicine

## 2023-05-26 DIAGNOSIS — E7521 Fabry (-Anderson) disease: Secondary | ICD-10-CM | POA: Diagnosis not present

## 2023-06-10 DIAGNOSIS — E7521 Fabry (-Anderson) disease: Secondary | ICD-10-CM | POA: Diagnosis not present

## 2023-06-13 ENCOUNTER — Encounter: Payer: Self-pay | Admitting: Internal Medicine

## 2023-06-20 ENCOUNTER — Ambulatory Visit (INDEPENDENT_AMBULATORY_CARE_PROVIDER_SITE_OTHER): Payer: Medicare HMO

## 2023-06-20 DIAGNOSIS — I441 Atrioventricular block, second degree: Secondary | ICD-10-CM

## 2023-06-20 LAB — CUP PACEART REMOTE DEVICE CHECK
Battery Remaining Longevity: 40 mo
Battery Remaining Percentage: 44 %
Battery Voltage: 2.98 V
Brady Statistic AP VP Percent: 80 %
Brady Statistic AP VS Percent: 1 %
Brady Statistic AS VP Percent: 20 %
Brady Statistic AS VS Percent: 1 %
Brady Statistic RA Percent Paced: 79 %
Brady Statistic RV Percent Paced: 99 %
Date Time Interrogation Session: 20250113060509
Implantable Lead Connection Status: 753985
Implantable Lead Connection Status: 753985
Implantable Lead Implant Date: 20120907
Implantable Lead Implant Date: 20120907
Implantable Lead Location: 753859
Implantable Lead Location: 753860
Implantable Pulse Generator Implant Date: 20201215
Lead Channel Impedance Value: 310 Ohm
Lead Channel Impedance Value: 400 Ohm
Lead Channel Pacing Threshold Amplitude: 0.625 V
Lead Channel Pacing Threshold Amplitude: 2 V
Lead Channel Pacing Threshold Pulse Width: 0.5 ms
Lead Channel Pacing Threshold Pulse Width: 0.8 ms
Lead Channel Sensing Intrinsic Amplitude: 10.6 mV
Lead Channel Sensing Intrinsic Amplitude: 5 mV
Lead Channel Setting Pacing Amplitude: 1.625
Lead Channel Setting Pacing Amplitude: 2.25 V
Lead Channel Setting Pacing Pulse Width: 0.8 ms
Lead Channel Setting Sensing Sensitivity: 4 mV
Pulse Gen Model: 2272
Pulse Gen Serial Number: 9187005

## 2023-06-24 DIAGNOSIS — E7521 Fabry (-Anderson) disease: Secondary | ICD-10-CM | POA: Diagnosis not present

## 2023-06-27 ENCOUNTER — Other Ambulatory Visit: Payer: Self-pay | Admitting: Internal Medicine

## 2023-06-27 DIAGNOSIS — R3589 Other polyuria: Secondary | ICD-10-CM

## 2023-07-08 DIAGNOSIS — E7521 Fabry (-Anderson) disease: Secondary | ICD-10-CM | POA: Diagnosis not present

## 2023-07-16 ENCOUNTER — Other Ambulatory Visit: Payer: Self-pay | Admitting: Internal Medicine

## 2023-07-16 DIAGNOSIS — G463 Brain stem stroke syndrome: Secondary | ICD-10-CM

## 2023-07-17 ENCOUNTER — Other Ambulatory Visit: Payer: Self-pay | Admitting: Internal Medicine

## 2023-07-17 DIAGNOSIS — M109 Gout, unspecified: Secondary | ICD-10-CM

## 2023-07-18 NOTE — Telephone Encounter (Signed)
 Requested medication (s) are due for refill today: Yes  Requested medication (s) are on the active medication list: Yes  Last refill:  04/14/23  Future visit scheduled: Yes  Notes to clinic:  Unable to refill per protocol due to failed labs, no updated results.      Requested Prescriptions  Pending Prescriptions Disp Refills   atorvastatin  (LIPITOR ) 80 MG tablet [Pharmacy Med Name: ATORVASTATIN  80 MG TABLET] 90 tablet 0    Sig: TAKE 1 TABLET BY MOUTH DAILY AT 6 PM.     Cardiovascular:  Antilipid - Statins Failed - 07/18/2023 11:13 AM      Failed - Lipid Panel in normal range within the last 12 months    Cholesterol, Total  Date Value Ref Range Status  10/26/2021 168 100 - 199 mg/dL Final   LDL Chol Calc (NIH)  Date Value Ref Range Status  10/26/2021 91 0 - 99 mg/dL Final   HDL  Date Value Ref Range Status  10/26/2021 62 >39 mg/dL Final   Triglycerides  Date Value Ref Range Status  10/26/2021 80 0 - 149 mg/dL Final         Passed - Patient is not pregnant      Passed - Valid encounter within last 12 months    Recent Outpatient Visits           2 months ago Type 2 diabetes mellitus with obesity (HCC)   [place] [facility] Dept Of [place]. [facility] Concetta Dee B, MD   6 months ago Type 2 diabetes mellitus with obesity [facility])   [place] [facility] - [facility] Dept Of [place]. [facility] Concetta Dee B, MD   10 months ago Type 2 diabetes mellitus with other circulatory complication, with long-term current use of insulin  [facility])   [place] [facility] - A Dept Of [place]. [facility] Concetta Dee B, MD   1 year ago Type 2 diabetes mellitus with other circulatory complication, with long-term current use of insulin  [name])   [place] [facility] - A Dept Of [place]. [facility] Lawrance Presume, MD   1 year ago Type 2 diabetes mellitus with peripheral neuropathy [facility])   [facility] - A Dept Of . [facility] Lawrance Presume, MD       Future Appointments             In 1 month Lincoln Renshaw Rexine Cater, MD [facility] [place] - A Dept Of [place] Frag[facility]. [facility]

## 2023-07-18 NOTE — Telephone Encounter (Signed)
 Requested medication (s) are due for refill today: Yes  Requested medication (s) are on the active medication list: Yes  Last refill:  01/27/23  Future visit scheduled: Yes  Notes to clinic:  Unable to refill per protocol due to failed labs, no updated results.      Requested Prescriptions  Pending Prescriptions Disp Refills   allopurinol  (ZYLOPRIM ) 100 MG tablet [Pharmacy Med Name: ALLOPURINOL  100 MG TABLET] 270 tablet 1    Sig: TAKE 3 TABLETS BY MOUTH EVERY DAY     Endocrinology:  Gout Agents - allopurinol  Failed - 07/18/2023  2:54 PM      Failed - Uric Acid in normal range and within 360 days    Uric Acid  Date Value Ref Range Status  10/06/2016 8.1 3.7 - 8.6 mg/dL Final    Comment:               Therapeutic target for gout patients: <6.0         Failed - CBC within normal limits and completed in the last 12 months    WBC  Date Value Ref Range Status  06/25/2022 7.3 3.4 - 10.8 x10E3/uL Final  05/22/2019 7.0 4.0 - 10.5 K/uL Final   RBC  Date Value Ref Range Status  06/25/2022 5.21 4.14 - 5.80 x10E6/uL Final  05/22/2019 5.29 4.22 - 5.81 MIL/uL Final   Hemoglobin  Date Value Ref Range Status  06/25/2022 15.3 13.0 - 17.7 g/dL Final   Total hemoglobin  Date Value Ref Range Status  07/26/2013 14.2 13.5 - 18.0 g/dL Final   Hematocrit  Date Value Ref Range Status  06/25/2022 45.4 37.5 - 51.0 % Final   MCHC  Date Value Ref Range Status  06/25/2022 33.7 31.5 - 35.7 g/dL Final  16/03/9603 54.0 30.0 - 36.0 g/dL Final   [facility]  Date Value Ref Range Status  06/25/2022 29.4 26.6 - 33.0 pg Final  05/22/2019 29.3 26.0 - 34.0 pg Final   MCV  Date Value Ref Range Status  06/25/2022 87 79 - 97 fL Final   No results found for: "PLTCOUNTKUC", "LABPLAT", "POCPLA" RDW  Date Value Ref Range Status  06/25/2022 12.1 11.6 - 15.4 % Final         Passed - Cr in normal range and within 360 days    Creat  Date Value Ref Range Status  11/07/2014 1.28 0.50 - 1.35 mg/dL Final    Creatinine, Ser  Date Value Ref Range Status  06/25/2022 2.26 (H) 0.76 - 1.27 mg/dL Final   Creatinine, POC  Date Value Ref Range Status  10/12/2022 57.3 mg/dL Final    Comment:    Abstracted by HIM   Creatinine, Urine  Date Value Ref Range Status  08/30/2014 72.9 mg/dL Final    Comment:    No reference range established.         Passed - Valid encounter within last 12 months    Recent Outpatient Visits           2 months ago Type 2 diabetes mellitus with obesity (HCC)   [place] [facility] Dept Of [place]. [facility] Concetta Dee B, MD   6 months ago Type 2 diabetes mellitus with obesity [facility])   [place] [facility] - A Dept Of [place]. [facility] Concetta Dee B, MD   10 months ago Type 2 diabetes mellitus with other circulatory complication, with long-term current use of insulin  (HCC)  [place] [facility] [place] [facility] Dept Of [place]. [facility] Concetta Dee B, MD   1 year ago Type 2 diabetes mellitus with other circulatory complication, with long-term current use of insulin  [facility])   [place] [facility] - A Dept Of [place]. [facility] Lawrance Presume, MD   1 year ago Type 2 diabetes mellitus with peripheral neuropathy [facility])   [place] [facility] - A Dept Of [place]. [facility] Lawrance Presume, MD       Future Appointments             In 1 month Lincoln Renshaw Rexine Cater, MD [facility] [place] - A Dept Of [place] Frag[facility]. [facility]

## 2023-07-20 ENCOUNTER — Other Ambulatory Visit: Payer: Self-pay | Admitting: Internal Medicine

## 2023-07-20 ENCOUNTER — Encounter: Payer: Self-pay | Admitting: Internal Medicine

## 2023-07-20 DIAGNOSIS — E7521 Fabry (-Anderson) disease: Secondary | ICD-10-CM | POA: Diagnosis not present

## 2023-07-20 DIAGNOSIS — N1831 Chronic kidney disease, stage 3a: Secondary | ICD-10-CM | POA: Diagnosis not present

## 2023-07-20 DIAGNOSIS — I152 Hypertension secondary to endocrine disorders: Secondary | ICD-10-CM

## 2023-07-20 DIAGNOSIS — I441 Atrioventricular block, second degree: Secondary | ICD-10-CM | POA: Diagnosis not present

## 2023-07-20 DIAGNOSIS — I639 Cerebral infarction, unspecified: Secondary | ICD-10-CM | POA: Diagnosis not present

## 2023-07-20 DIAGNOSIS — I890 Lymphedema, not elsewhere classified: Secondary | ICD-10-CM | POA: Diagnosis not present

## 2023-07-20 DIAGNOSIS — M109 Gout, unspecified: Secondary | ICD-10-CM | POA: Diagnosis not present

## 2023-07-20 DIAGNOSIS — I129 Hypertensive chronic kidney disease with stage 1 through stage 4 chronic kidney disease, or unspecified chronic kidney disease: Secondary | ICD-10-CM | POA: Diagnosis not present

## 2023-07-20 DIAGNOSIS — E118 Type 2 diabetes mellitus with unspecified complications: Secondary | ICD-10-CM | POA: Diagnosis not present

## 2023-07-21 LAB — LAB REPORT - SCANNED
Creatinine, POC: 51 mg/dL
EGFR: 38

## 2023-07-22 DIAGNOSIS — E7521 Fabry (-Anderson) disease: Secondary | ICD-10-CM | POA: Diagnosis not present

## 2023-07-23 ENCOUNTER — Other Ambulatory Visit: Payer: Self-pay | Admitting: Internal Medicine

## 2023-07-23 ENCOUNTER — Encounter: Payer: Self-pay | Admitting: Internal Medicine

## 2023-07-23 DIAGNOSIS — E1159 Type 2 diabetes mellitus with other circulatory complications: Secondary | ICD-10-CM

## 2023-07-23 DIAGNOSIS — I5032 Chronic diastolic (congestive) heart failure: Secondary | ICD-10-CM

## 2023-07-25 ENCOUNTER — Other Ambulatory Visit: Payer: Self-pay

## 2023-07-25 DIAGNOSIS — I5032 Chronic diastolic (congestive) heart failure: Secondary | ICD-10-CM

## 2023-07-25 DIAGNOSIS — I152 Hypertension secondary to endocrine disorders: Secondary | ICD-10-CM

## 2023-07-25 MED ORDER — FUROSEMIDE 20 MG PO TABS: 20.0000 mg | ORAL_TABLET | Freq: Every day | ORAL | 0 refills | Status: DC

## 2023-07-25 MED ORDER — AMLODIPINE BESYLATE 10 MG PO TABS: 10.0000 mg | ORAL_TABLET | Freq: Every day | ORAL | 1 refills | Status: DC

## 2023-07-27 ENCOUNTER — Telehealth: Payer: Self-pay | Admitting: Neurology

## 2023-07-27 NOTE — Telephone Encounter (Signed)
Laboratory evaluation from [facility], July 20, 2023, hemoglobin of 15.3, creatinine of 1.94, EGFR of 38, normal liver functional test,

## 2023-08-03 ENCOUNTER — Telehealth (HOSPITAL_BASED_OUTPATIENT_CLINIC_OR_DEPARTMENT_OTHER): Payer: Medicare HMO | Admitting: Physician Assistant

## 2023-08-03 DIAGNOSIS — E1169 Type 2 diabetes mellitus with other specified complication: Secondary | ICD-10-CM | POA: Diagnosis not present

## 2023-08-03 DIAGNOSIS — Z7985 Long-term (current) use of injectable non-insulin antidiabetic drugs: Secondary | ICD-10-CM | POA: Diagnosis not present

## 2023-08-03 DIAGNOSIS — E669 Obesity, unspecified: Secondary | ICD-10-CM

## 2023-08-03 MED ORDER — SEMAGLUTIDE (2 MG/DOSE) 8 MG/3ML ~~LOC~~ SOPN: 2.0000 mg | PEN_INJECTOR | SUBCUTANEOUS | 4 refills | Status: DC

## 2023-08-03 NOTE — Progress Notes (Signed)
 [name], male   DOB: 03/14/1961, 63 y.o.   MRN: [id number] Virtual Visit via Video Note  I connected with [name] on 08/03/23 at  3:50 PM EST by a video enabled telemedicine application and verified that I am speaking with the correct person using two identifiers.  Location: Patient: home Provider: [facility] office   I discussed the limitations of evaluation and management by telemedicine and the availability of in person appointments. The patient expressed understanding and agreed to proceed.  History of Present Illness:  for the past several weeks blood sugars ranging from 150-340.  Previously doing great on Ozempic 1mg  weekly but since BS have been going up he is back to taking 8 units insulin daily.  He was supposed to up in dose with ozempic 04/2023 but somehow was unaware of this so still only taking 1mg  weekly on Sundays.  Has Libre 2 CGM.  He was drinking a lot of juice and has now stopped doing that and has also been incorporating more healthy food into diet and less frozen meals.      Observations/Objective:  NAD.  A&Ox3.  Speech is clear.  Good articulation and understanding of med regimen.    Assessment and Plan: 1. Type 2 diabetes mellitus with obesity (HCC) Increase dose of ozempic(as was recommended 04/2023.  Keep glucose readings. Work on diabetic diet.  He verbalized hyper and hypo plan - Semaglutide, 2 MG/DOSE, 8 MG/3ML SOPN; Inject 2 mg as directed once a week.  Dispense: 3 mL; Refill: 4  Follow Up Instructions: See PCP in about 6 weeks   I discussed the assessment and treatment plan with the patient. The patient was provided an opportunity to ask questions and all were answered. The patient agreed with the plan and demonstrated an understanding of the instructions.   The patient was advised to call back or seek an in-person evaluation if the symptoms worsen or if the condition fails to improve as anticipated.  I provided 15 minutes of  non-face-to-face(video) time during this encounter.   Georgian Co, PA-C

## 2023-08-03 NOTE — Progress Notes (Signed)
 Remote pacemaker transmission.

## 2023-08-05 DIAGNOSIS — E7521 Fabry (-Anderson) disease: Secondary | ICD-10-CM | POA: Diagnosis not present

## 2023-08-06 ENCOUNTER — Encounter: Payer: Self-pay | Admitting: Internal Medicine

## 2023-08-09 ENCOUNTER — Other Ambulatory Visit: Payer: Self-pay

## 2023-08-09 ENCOUNTER — Other Ambulatory Visit: Payer: Self-pay | Admitting: Internal Medicine

## 2023-08-09 DIAGNOSIS — E1169 Type 2 diabetes mellitus with other specified complication: Secondary | ICD-10-CM

## 2023-08-09 MED ORDER — SEMAGLUTIDE (2 MG/DOSE) 8 MG/3ML ~~LOC~~ SOPN
2.0000 mg | PEN_INJECTOR | SUBCUTANEOUS | 4 refills | Status: DC
  Filled 2023-08-09: qty 3, 28d supply, fill #0
  Filled 2023-09-02: qty 12, 112d supply, fill #0
  Filled 2023-12-28: qty 3, 28d supply, fill #1

## 2023-08-10 ENCOUNTER — Other Ambulatory Visit: Payer: Self-pay

## 2023-08-19 ENCOUNTER — Other Ambulatory Visit: Payer: Self-pay

## 2023-08-19 DIAGNOSIS — E7521 Fabry (-Anderson) disease: Secondary | ICD-10-CM | POA: Diagnosis not present

## 2023-08-24 ENCOUNTER — Other Ambulatory Visit: Payer: Self-pay

## 2023-08-24 ENCOUNTER — Encounter: Payer: Self-pay | Admitting: Internal Medicine

## 2023-08-25 ENCOUNTER — Other Ambulatory Visit: Payer: Self-pay

## 2023-08-25 ENCOUNTER — Encounter: Payer: Self-pay | Admitting: Internal Medicine

## 2023-08-26 ENCOUNTER — Other Ambulatory Visit: Payer: Self-pay

## 2023-08-26 ENCOUNTER — Telehealth: Payer: Self-pay | Admitting: Internal Medicine

## 2023-08-26 ENCOUNTER — Other Ambulatory Visit: Payer: Self-pay | Admitting: Internal Medicine

## 2023-08-26 MED ORDER — SEMAGLUTIDE (1 MG/DOSE) 4 MG/3ML ~~LOC~~ SOPN: 1.0000 mg | PEN_INJECTOR | SUBCUTANEOUS | 1 refills | Status: DC

## 2023-08-26 NOTE — Telephone Encounter (Signed)
-----   Message from Weldon Picking sent at 08/26/2023  9:19 AM EDT ----- Can you send the Ozempic 1mg  as a prescription to our pharmacy for me for me to dispense it to the patient? I told him I would call him when I have it ready for pick-up.

## 2023-08-29 ENCOUNTER — Ambulatory Visit: Payer: Medicare HMO | Attending: Internal Medicine | Admitting: Internal Medicine

## 2023-08-29 ENCOUNTER — Other Ambulatory Visit: Payer: Self-pay

## 2023-08-29 ENCOUNTER — Other Ambulatory Visit: Payer: Self-pay | Admitting: Pharmacist

## 2023-08-29 DIAGNOSIS — Z6837 Body mass index (BMI) 37.0-37.9, adult: Secondary | ICD-10-CM

## 2023-08-29 DIAGNOSIS — I5032 Chronic diastolic (congestive) heart failure: Secondary | ICD-10-CM

## 2023-08-29 DIAGNOSIS — Z23 Encounter for immunization: Secondary | ICD-10-CM

## 2023-08-29 DIAGNOSIS — E1169 Type 2 diabetes mellitus with other specified complication: Secondary | ICD-10-CM

## 2023-08-29 DIAGNOSIS — E1159 Type 2 diabetes mellitus with other circulatory complications: Secondary | ICD-10-CM

## 2023-08-29 DIAGNOSIS — E1122 Type 2 diabetes mellitus with diabetic chronic kidney disease: Secondary | ICD-10-CM | POA: Diagnosis not present

## 2023-08-29 DIAGNOSIS — N1832 Chronic kidney disease, stage 3b: Secondary | ICD-10-CM | POA: Diagnosis not present

## 2023-08-29 DIAGNOSIS — Z7985 Long-term (current) use of injectable non-insulin antidiabetic drugs: Secondary | ICD-10-CM

## 2023-08-29 DIAGNOSIS — Z794 Long term (current) use of insulin: Secondary | ICD-10-CM

## 2023-08-29 DIAGNOSIS — E7521 Fabry (-Anderson) disease: Secondary | ICD-10-CM | POA: Diagnosis not present

## 2023-08-29 LAB — POCT GLYCOSYLATED HEMOGLOBIN (HGB A1C): HbA1c, POC (controlled diabetic range): 9 % — AB (ref 0.0–7.0)

## 2023-08-29 LAB — GLUCOSE, POCT (MANUAL RESULT ENTRY): POC Glucose: 138 mg/dL — AB (ref 70–99)

## 2023-08-29 MED ORDER — SEMAGLUTIDE (1 MG/DOSE) 4 MG/3ML ~~LOC~~ SOPN
1.0000 mg | PEN_INJECTOR | SUBCUTANEOUS | 1 refills | Status: DC
  Filled 2023-08-29: qty 3, 28d supply, fill #0

## 2023-08-29 MED ORDER — ZOSTER VAC RECOMB ADJUVANTED 50 MCG/0.5ML IM SUSR
0.5000 mL | Freq: Once | INTRAMUSCULAR | 0 refills | Status: AC
Start: 2023-08-29 — End: 2023-08-30
  Filled 2023-08-29: qty 1, 1d supply, fill #0

## 2023-08-29 NOTE — Progress Notes (Unsigned)
 [name], male    DOB: 04-26-1961  MRN: [id number]  CC: Diabetes (DM f/u. /No questions / concerns/Yes to shingles vax)   Subjective: [name] is a 63 y.o. male who presents for chronic ds management. His concerns today include:  Patient with history of diabetes type 2 with microalbuminuria, diastolic CHF with pacemaker (secconary to AV block with syncopy), CKD stage III, HOCM, HTN, HL, CVA, lymphedema. 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  `  Discussed the use of AI scribe software for clinical note transcription with the patient, who gave verbal consent to proceed.  History of Present Illness   [name] is a 63 year old male with diabetes who presents for follow-up of his diabetes management.  His A1c has increased from 7.1 in November to 9.0 currently. He was supposed to be on 2 mg of Ozempic but was taking 1 mg due to a backlog. He had to restart NovoLog 70/30 insulin due to rising blood sugars and is currently taking 12 units. He missed one week of Ozempic and is using leftover 1 mg pens until the 2 mg supply is available. He uses a [employer] continuous glucose monitor but did not bring the reader to the appointment. He has been consuming apple juice and cinnamon but stopped as it did not help his blood sugar levels. He now sticks to apples, oranges, and water. His morning blood sugars are typically around 127, and afternoon and evening levels are around 131.  He is managing hypertension with amlodipine, doxazosin, furosemide, and valsartan.  No increased shortness of breath or leg swelling related to his congestive heart failure. He is limiting salt and sugar intake and is considering switching from white rice to brown rice.  He has chronic kidney disease stage 3, which remains stable.  He continues to receive infusions for sickle cell disease every two weeks, and the port is functioning well. He uses a cream to  numb the area before infusions.  He is due for the final shingles vaccine and an eye exam. He had an eye exam in January 2025 and is being monitored for cataracts.      History of Present Illness    Patient Active Problem List   Diagnosis Date Noted   Tetanus, diphtheria, and acellular pertussis (Tdap) vaccination declined 08/24/2022   Atrial flutter, unspecified type (HCC) 06/26/2022   Post-nasal drip 10/26/2021   Chronic sinusitis 07/01/2021   Contact dermatitis and other eczema 07/01/2021   Edema 07/01/2021   Other and unspecified hyperlipidemia 07/01/2021   Proteinuria 07/01/2021   Macroalbuminuric diabetic nephropathy (HCC) 11/08/2020   Diabetes mellitus (HCC) 11/07/2020   Hypertension associated with stage 3a chronic kidney disease due to type 2 diabetes mellitus (HCC) 11/07/2020   Influenza vaccine needed 07/08/2020   Spinal stenosis of lumbar region without neurogenic claudication 07/08/2020   Degenerative spondylolisthesis 05/20/2020   Acute left-sided low back pain with left-sided sciatica 05/08/2020   Reactive airway disease 01/24/2020   Pain due to onychomycosis of toenails of both feet 10/17/2019   Morbid obesity (HCC) 08/23/2019   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   CVA (cerebral vascular accident) (HCC) 10/20/2018   UTI (urinary tract infection) 10/19/2018   Upper airway cough syndrome 08/01/2018   Lymphedema 10/06/2016   Gout, arthritis    Anorectal fistula 06/23/2015   Onychomycosis of toenail 09/26/2014   Type 2 diabetes mellitus with obesity (HCC)    Iron deficiency  anemia 07/29/2014   CKD (chronic kidney disease), stage III (HCC)    Chronic diastolic HF (heart failure) (HCC) 03/27/2013   Peripheral edema 07/20/2012   Obstructive sleep apnea, suspected 06/21/2012   Pacemaker-St.Jude 02/23/2012   Hypertension 08/09/2011   Mobitz type II atrioventricular block 05/19/2011   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 102 each 12   acetaminophen (TYLENOL) 325 MG tablet Take 1-2 tablets (325-650 mg total) by mouth every 4 (four) hours as needed for mild pain.     Agalsidase beta (FABRAZYME IV) Inject 115 mg into the vein every 14 (fourteen) days.      albuterol (PROAIR HFA) 108 (90 Base) MCG/ACT inhaler Inhale 1 puff into the lungs every 6 (six) hours as needed for wheezing or shortness of breath. 8.5 each 1   allopurinol (ZYLOPRIM) 100 MG tablet TAKE 3 TABLETS BY MOUTH EVERY DAY 270 tablet 1   amLODipine (NORVASC) 10 MG tablet Take 1 tablet (10 mg total) by mouth daily. 90 tablet 1   atorvastatin (LIPITOR) 80 MG tablet TAKE 1 TABLET BY MOUTH DAILY AT 6 PM. 90 tablet 0   clopidogrel (PLAVIX) 75 MG tablet Take 1 tablet (75 mg total) by mouth daily. 90 tablet 1   Continuous Glucose Receiver (FREESTYLE LIBRE 2 READER) DEVI Use to check blood sugar continuously. Change sensor once every 14 days. E11.59 1 each 0   Continuous Glucose Sensor (FREESTYLE LIBRE SENSOR SYSTEM) MISC Use to check blood sugar continuously. Change sensor once every 14 days. E11.59 2 each 12   doxazosin (CARDURA) 2 MG tablet TAKE 0.5 TABLETS BY MOUTH DAILY. 45 tablet 1   fexofenadine (ALLEGRA) 180 MG tablet Take 180 mg by mouth every 14 (fourteen) days.     furosemide (LASIX) 20 MG tablet Take 1 tablet (20 mg total) by mouth daily. 90 tablet 0   Insulin Pen Needle (PX SHORTLENGTH PEN NEEDLES) 31G X 8 MM MISC 1 each by Does not apply route 2 (two) times daily before a meal. 100 each 12   lidocaine-prilocaine (EMLA) cream Apply thin layer to affected area of skin daily PRN 1 g 0   Semaglutide, 2 MG/DOSE, 8 MG/3ML SOPN Inject 2 mg as directed once a week. 3 mL 4   valsartan-hydrochlorothiazide (DIOVAN-HCT) 80-12.5 MG tablet TAKE 1 TABLET BY MOUTH EVERY DAY 90 tablet 0   fluticasone (FLONASE) 50 MCG/ACT nasal spray PLACE 1  SPRAY INTO BOTH NOSTRILS DAILY AS NEEDED FOR ALLERGIES. (Patient not taking: Reported on 08/29/2023) 48 mL 1   No current facility-administered medications on file prior to visit.    Allergies  Allergen Reactions   Lisinopril Anaphylaxis   Shellfish Allergy Hives and Swelling    Social History   Socioeconomic History   Marital status: Divorced    Spouse name: Not on file   Number of children: Not on file   Years of education: Not on file   Highest education level: Not on file  Occupational History   Not on file  Tobacco Use   Smoking status: Never   Smokeless tobacco: Never   Tobacco comments:    Never smoked. 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    Social Drivers of Health   Financial Resource Strain: Low Risk  (04/29/2023)   Overall Financial Resource Strain (CARDIA)    Difficulty of Paying Living Expenses: Not hard at all  Food Insecurity: No Food Insecurity (04/29/2023)   Hunger Vital Sign    Worried About Running Out of Food in the Last Year: Never true    Ran Out of Food in the Last Year: Never true  Transportation Needs: No Transportation Needs (04/29/2023)   PRAPARE - Administrator, Civil Service (Medical): No    Lack of Transportation (Non-Medical): No  Physical Activity: Insufficiently Active (04/29/2023)   Exercise Vital Sign    Days of Exercise per Week: 3 days    Minutes of Exercise per Session: 30 min  Stress: No Stress Concern Present (04/29/2023)   [employer] of Occupational Health - Occupational Stress Questionnaire    Feeling of Stress : Not at all  Social Connections: Socially Isolated (04/29/2023)   Social Connection and Isolation Panel  [NHANES]    Frequency of Communication with Friends and Family: More than three times a week    Frequency of Social Gatherings with Friends and Family: Never    Attends Religious Services: Never    Database administrator or Organizations: No    Attends Banker Meetings: Never    Marital Status: Divorced  Catering manager Violence: Not At Risk (04/29/2023)   Humiliation, Afraid, Rape, and Kick questionnaire    Fear of Current or Ex-Partner: No    Emotionally Abused: No    Physically Abused: No    Sexually Abused: No    Family History  Problem Relation Age of Onset   Stroke Mother    Fabry's disease Mother    Fabry's disease Brother    Colon cancer Neg Hx     Past Surgical History:  Procedure Laterality Date   CARDIAC CATHETERIZATION  03-12-2011  DR Verdis Prime   HYPERTROPHIC CARDIOMYOPATHY WITH LV CAVITY  APPEARANCE CONSISTANT WITH SIGNIFICANT APICAL HYPERTROPHY/ NORMAL LVSF / EF 55%/ EVIDENCE OF DIASTOLIC DYSFUNCTION WITH EDP OF 23-64mmHg AFTER A-WAVE/ NORMAL CORONARY ARTERIES   EVALUATION UNDER ANESTHESIA WITH FISTULECTOMY N/A 06/23/2015   Procedure: EXAM UNDER ANESTHESIA WITH POSSIBLE FISTULOTOMY;  Surgeon: Jimmye Norman, MD;  Location: [facility] OR;  Service: General;  Laterality: N/A;   HERNIA REPAIR  06/08/2011   Umbilical Hernia Repair   INCISION AND DRAINAGE PERIRECTAL ABSCESS Left 07/29/2014   Procedure: IRRIGATION AND DEBRIDEMENT PERIRECTAL ABSCESS;  Surgeon: Frederik Schmidt, MD;  Location: [facility] OR;  Service: General;  Laterality: Left;  Prone position   INSERT / REPLACE / REMOVE PACEMAKER  02/12/2011   SJM implanted by Dr Johney Frame for Mobitz II second degree AV block and syncope   IR IMAGING GUIDED PORT INSERTION  03/02/2023   IRRIGATION AND DEBRIDEMENT ABSCESS N/A 07/27/2013   Procedure: IRRIGATION AND DEBRIDEMENT OF SKIN, SOFT TISSUE AND MUSCLES OF UPPER BACK (11X19X4cm) WITH 10 BLADE AND PULSATILE LAVAGE ;  Surgeon: Atilano Ina, MD;  Location: [facility] OR;  Service: General;   Laterality: N/A;   PPM GENERATOR CHANGEOUT N/A 05/22/2019   Procedure: PPM GENERATOR CHANGEOUT;  Surgeon: Hillis Range, MD;  Location: [facility]AS[facility];  Service: Cardiovascular;  Laterality: N/A;   UMBILICAL HERNIA REPAIR  03/22/2012   Procedure:  HERNIA REPAIR UMBILICAL ADULT;  Surgeon: Romie Levee, MD;  Location: [facility];  Service: General;  Laterality: N/A;  Umbilical Hernia Repair     ROS: Review of Systems Negative except as stated above  PHYSICAL EXAM: BP 103/70 (BP Location: Left Arm, Patient Position: Sitting, Cuff Size: Large)   Pulse 77   Temp 97.8 F (36.6 C) (Oral)   Ht 5\' 10"  (1.778 m)   Wt 262 lb (118.8 kg)   SpO2 99%   BMI 37.59 kg/m   Wt Readings from Last 3 Encounters:  08/29/23 262 lb (118.8 kg)  04/29/23 264 lb (119.7 kg)  03/02/23 262 lb (118.8 kg)    Physical Exam   General appearance - alert, well appearing, and in no distress Chest - clear to auscultation, no wheezes, rales or rhonchi, symmetric air entry Heart - normal rate, regular rhythm, normal S1, S2, no murmurs, rubs, clicks or gallops Extremities - lymphedema legs - wearing compression socks     Latest Ref Rng & Units 06/25/2022    3:32 PM 10/26/2021    9:45 AM 02/23/2021   10:26 AM  CMP  Glucose 70 - 99 mg/dL 664  403  474   BUN 8 - 27 mg/dL 30  20  27    Creatinine 0.76 - 1.27 mg/dL 2.59  5.63  8.75   Sodium 134 - 144 mmol/L 140  149  146   Potassium 3.5 - 5.2 mmol/L 4.5  4.3  4.7   Chloride 96 - 106 mmol/L 100  109  107   CO2 20 - 29 mmol/L 22  22  24    Calcium 8.6 - 10.2 mg/dL 9.6  9.8  9.7   Total Protein 6.0 - 8.5 g/dL 7.5  7.8    Total Bilirubin 0.0 - 1.2 mg/dL 0.9  0.5    Alkaline Phos 44 - 121 IU/L 132  138    AST 0 - 40 IU/L 33  33    ALT 0 - 44 IU/L 29  32     Lipid Panel     Component Value Date/Time   CHOL 168 10/26/2021 0945   TRIG 80 10/26/2021 0945   HDL 62 10/26/2021 0945   CHOLHDL 2.7 10/26/2021 0945   CHOLHDL 4.4 10/20/2018 0324   VLDL 18 10/20/2018 0324    LDLCALC 91 10/26/2021 0945    CBC    Component Value Date/Time   WBC 7.3 06/25/2022 1532   WBC 7.0 05/22/2019 1201   RBC 5.21 06/25/2022 1532   RBC 5.29 05/22/2019 1201   HGB 15.3 06/25/2022 1532   HCT 45.4 06/25/2022 1532   PLT 178 06/25/2022 1532   MCV 87 06/25/2022 1532   MCH 29.4 06/25/2022 1532   MCH 29.3 05/22/2019 1201   MCHC 33.7 06/25/2022 1532   MCHC 32.1 05/22/2019 1201   RDW 12.1 06/25/2022 1532   LYMPHSABS 2.9 08/23/2019 1152   MONOABS 1.3 (H) 10/24/2018 0515   EOSABS 0.3 08/23/2019 1152   BASOSABS 0.1 08/23/2019 1152    ASSESSMENT AND PLAN: 1. Type 2 diabetes mellitus with morbid obesity (HCC) (Primary) *** - POCT glycosylated hemoglobin (Hb A1C) - POCT glucose (manual entry)  2. Insulin long-term use (HCC) 3. Long-term (current) use of injectable non-insulin antidiabetic drugs See #1 above  4. Hypertension associated with type 2 diabetes mellitus (HCC) At goal Continue amlodipine 10 mg daily, doxazosin 2 mg half a tablet daily, furosemide 20 mg daily, Diovan/HCTZ 80/12.5 mg daily   5. Stage 3b chronic kidney disease (  HCC) Stable.  6. Chronic diastolic HF (heart failure) (HCC) Stable and compensated  7. 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HCC) He continues infusion 2x/mth through his nephrologist  8. Need for shingles vaccine - Zoster Vaccine Adjuvanted [facility]) injection; Inject 0.5 mLs into the muscle once for 1 dose.  Dispense: 1 each; Refill: 0  Assessment and Plan Assessment & Plan      1. Type 2 diabetes mellitus with obesity (HCC) (Primary) *** - POCT glycosylated hemoglobin (Hb A1C) - POCT glucose (manual entry)    Patient was given the opportunity to ask questions.  Patient verbalized understanding of the plan and was able to repeat key elements of the plan.   This documentation was completed using Paediatric nurse.  Any transcriptional errors are unintentional.  Orders Placed This Encounter  Procedures   POCT  glycosylated hemoglobin (Hb A1C)   POCT glucose (manual entry)     Requested Prescriptions    No prescriptions requested or ordered in this encounter    No follow-ups on file.  Jonah Blue, MD, FACP

## 2023-08-30 ENCOUNTER — Other Ambulatory Visit: Payer: Self-pay

## 2023-08-31 ENCOUNTER — Other Ambulatory Visit: Payer: Self-pay

## 2023-09-02 ENCOUNTER — Other Ambulatory Visit: Payer: Self-pay

## 2023-09-02 DIAGNOSIS — E7521 Fabry (-Anderson) disease: Secondary | ICD-10-CM | POA: Diagnosis not present

## 2023-09-05 ENCOUNTER — Other Ambulatory Visit: Payer: Self-pay

## 2023-09-09 ENCOUNTER — Other Ambulatory Visit: Payer: Self-pay

## 2023-09-14 ENCOUNTER — Other Ambulatory Visit: Payer: Self-pay

## 2023-09-16 DIAGNOSIS — E7521 Fabry (-Anderson) disease: Secondary | ICD-10-CM | POA: Diagnosis not present

## 2023-09-19 ENCOUNTER — Other Ambulatory Visit: Payer: Self-pay

## 2023-09-19 ENCOUNTER — Ambulatory Visit: Payer: Medicare HMO

## 2023-09-19 DIAGNOSIS — I441 Atrioventricular block, second degree: Secondary | ICD-10-CM

## 2023-09-20 LAB — CUP PACEART REMOTE DEVICE CHECK
Battery Remaining Longevity: 35 mo
Battery Remaining Percentage: 41 %
Battery Voltage: 2.96 V
Brady Statistic AP VP Percent: 80 %
Brady Statistic AP VS Percent: 1 %
Brady Statistic AS VP Percent: 19 %
Brady Statistic AS VS Percent: 1 %
Brady Statistic RA Percent Paced: 80 %
Brady Statistic RV Percent Paced: 99 %
Date Time Interrogation Session: 20250415034635
Implantable Lead Connection Status: 753985
Implantable Lead Connection Status: 753985
Implantable Lead Implant Date: 20120907
Implantable Lead Implant Date: 20120907
Implantable Lead Location: 753859
Implantable Lead Location: 753860
Implantable Pulse Generator Implant Date: 20201215
Lead Channel Impedance Value: 290 Ohm
Lead Channel Impedance Value: 360 Ohm
Lead Channel Pacing Threshold Amplitude: 0.75 V
Lead Channel Pacing Threshold Amplitude: 1.875 V
Lead Channel Pacing Threshold Pulse Width: 0.5 ms
Lead Channel Pacing Threshold Pulse Width: 0.8 ms
Lead Channel Sensing Intrinsic Amplitude: 5 mV
Lead Channel Sensing Intrinsic Amplitude: 9.1 mV
Lead Channel Setting Pacing Amplitude: 1.75 V
Lead Channel Setting Pacing Amplitude: 2.125
Lead Channel Setting Pacing Pulse Width: 0.8 ms
Lead Channel Setting Sensing Sensitivity: 4 mV
Pulse Gen Model: 2272
Pulse Gen Serial Number: 9187005

## 2023-09-22 ENCOUNTER — Ambulatory Visit: Payer: Medicare HMO | Admitting: Internal Medicine

## 2023-09-29 ENCOUNTER — Telehealth: Payer: Self-pay

## 2023-09-29 ENCOUNTER — Encounter: Payer: Self-pay | Admitting: Pharmacist

## 2023-09-29 ENCOUNTER — Other Ambulatory Visit: Payer: Self-pay

## 2023-09-29 ENCOUNTER — Ambulatory Visit: Attending: Family Medicine | Admitting: Pharmacist

## 2023-09-29 ENCOUNTER — Other Ambulatory Visit: Payer: Self-pay | Admitting: Internal Medicine

## 2023-09-29 DIAGNOSIS — Z794 Long term (current) use of insulin: Secondary | ICD-10-CM | POA: Diagnosis not present

## 2023-09-29 DIAGNOSIS — E1169 Type 2 diabetes mellitus with other specified complication: Secondary | ICD-10-CM | POA: Diagnosis not present

## 2023-09-29 DIAGNOSIS — Z7985 Long-term (current) use of injectable non-insulin antidiabetic drugs: Secondary | ICD-10-CM

## 2023-09-29 DIAGNOSIS — E669 Obesity, unspecified: Secondary | ICD-10-CM | POA: Diagnosis not present

## 2023-09-29 MED ORDER — INSULIN ASPART PROT & ASPART (70-30 MIX) 100 UNIT/ML ~~LOC~~ SUSP: 12.0000 [IU] | Freq: Two times a day (BID) | SUBCUTANEOUS | 3 refills | Status: DC

## 2023-09-29 NOTE — Telephone Encounter (Signed)
  4 MONTHS OF OZEMPIC  2MG  PICKED UP FROM PHARMACY AT NO CHARGE(PAP STOCK SHIPPED FROM [facility]) ON 09/09/23.

## 2023-09-29 NOTE — Progress Notes (Signed)
 Pharmacy TNM Diabetes Measure Review  S:  Patient was identified in a report as being at risk for failing the True [place] Metric of A1c control (<8%) in [place] and African American patients. Last A1c was 9 on 08/29/2023. Last PCP visit was 08/29/2023. At that time, patient was taking 1 mg weekly of Ozempic  due to the 2 mg dose being unavailable. His patient assistance stock had not come in at the time. He was instructed to come see me.   Today, pt reports doing well. He recently received his 2 mg MAP supply on 09/09/2023. Denies any NV, abdominal pain. He did not bring his receiver in today but is able to recall numbers from yesterday and I have reported these below.   Current diabetes medications include: Novolog  70/30 12u BID, Ozempic  2 mg weekly Patient reports adherence to taking all medications as prescribed.   Insurance coverage: A[employer]na Medicare  Patient denies hypoglycemic events.  Reported home blood sugars: 99, 113, 117, 103 yesterday. Tells me his home levels have been better recently.   Patient reported dietary habits:  -Since last visit, has eliminated juice and limits even "zero calorie" beverages.  -Has increased his water  intake -Does admit that he's a snacker. Tells me that sometimes at night, he'll get up around 9-10p and make a sandwich before going back to bed.   Patient-reported exercise habits: none reported   O:  No CGM or glucometer with him at this visit.  Lab Results  Component Value Date   HGBA1C 9.0 (A) 08/29/2023   There were no vitals filed for this visit.  Lipid Panel     Component Value Date/Time   CHOL 168 10/26/2021 0945   TRIG 80 10/26/2021 0945   HDL 62 10/26/2021 0945   CHOLHDL 2.7 10/26/2021 0945   CHOLHDL 4.4 10/20/2018 0324   VLDL 18 10/20/2018 0324   LDLCALC 91 10/26/2021 0945    Clinical Atherosclerotic Cardiovascular Disease (ASCVD): Yes  The ASCVD Risk score (Arnett DK, et al., 2019) failed to calculate for the following reasons:    Risk score cannot be calculated because patient has a medical history suggesting prior/existing ASCVD   Patient is participating in a Managed Medicaid Plan: No   A/P: Diabetes longstanding currently uncontrolled with his current A1c of 9.0%. Luckily, his MAP stock of Ozempic  2 mg was just dispensed this month and he is tolerating this well. He is asymptomatic from a hyper- or hypoglycemia standpoint. Patient is able to verbalize appropriate hypoglycemia management plan. Medication adherence appears to be appropriate. Although I am unable to see his CGM report, his reported numbers from yesterday look good. I recommend he return in 1 month so I can reassess medication adherence and home blood sugar readings.  -Continued Ozempic  2 mg weekly.  -Continued Novolog  70/30 12 units BID for now.  -Patient educated on purpose, proper use, and potential adverse effects of Ozempic .  -Extensively discussed pathophysiology of diabetes, recommended lifestyle interventions, dietary effects on blood sugar control.  -Counseled on s/sx of and management of hypoglycemia.  -Next A1c anticipated 11/2023.   Follow-up:  Pharmacist 11/01/2023. PCP clinic visit in 12/29/2023.  Marene Shape, PharmD, Becky Bowels, CPP Clinical Pharmacist [facility] [phone]

## 2023-09-30 DIAGNOSIS — E7521 Fabry (-Anderson) disease: Secondary | ICD-10-CM | POA: Diagnosis not present

## 2023-10-04 ENCOUNTER — Ambulatory Visit: Payer: Medicare HMO | Attending: Internal Medicine

## 2023-10-04 VITALS — Ht 70.0 in | Wt 263.0 lb

## 2023-10-04 DIAGNOSIS — E119 Type 2 diabetes mellitus without complications: Secondary | ICD-10-CM

## 2023-10-04 DIAGNOSIS — E669 Obesity, unspecified: Secondary | ICD-10-CM

## 2023-10-04 DIAGNOSIS — Z0000 Encounter for general adult medical examination without abnormal findings: Secondary | ICD-10-CM | POA: Diagnosis not present

## 2023-10-04 NOTE — Patient Instructions (Addendum)
 [name] , Thank you for taking time to come for your Medicare Wellness Visit. I appreciate your ongoing commitment to your health goals. Please review the following plan we discussed and let me know if I can assist you in the future.   Referrals/Orders/Follow-Ups/Clinician Recommendations: Yes, an referral was placed for you to see Dr. Devin Foerster for your 6 month follow up.  [facility] will call you to schedule.  Keep maintaining your health by keeping your appointments with Dr. Concetta Dee and any specialists that you may see.  Call us  if you need anything.  Have a great year!!!!  This is a list of the screening recommended for you and due dates:  Health Maintenance  Topic Date Due   Complete foot exam   10/27/2022   COVID-19 Vaccine (4 - 2024-25 season) 02/06/2023   Eye exam for diabetics  07/06/2023   Colon Cancer Screening  01/23/2024   Yearly kidney health urinalysis for diabetes  12/27/2023   Flu Shot  01/06/2024   Hemoglobin A1C  02/29/2024   Yearly kidney function blood test for diabetes  07/20/2024   Medicare Annual Wellness Visit  10/03/2024   DTaP/Tdap/Td vaccine (2 - Td or Tdap) 12/26/2032   Pneumococcal Vaccination  Completed   Hepatitis C Screening  Completed   HIV Screening  Completed   Zoster (Shingles) Vaccine  Completed   HPV Vaccine  Aged Out   Meningitis B Vaccine  Aged Out   Recommendations from Nurse Marni Franzoni: Aim for 30 minutes of exercise or brisk walking 5 days per week.  Drink 6-8 glasses of water  each day. Eat 5-6 servings of fresh vegetables and fruits per day.  Advanced directives: (Declined) Advance directive discussed with you today. Even though you declined this today, please call our office should you change your mind, and we can give you the proper paperwork for you to fill out.  Next Medicare Annual Wellness Visit scheduled for next year: Yes, It was nice speaking with you today! Your next Annual Wellness Visit is scheduled for  10/09/2024 at 9:50 a.m. via PHONE VISIT. If you need to reschedule or cancel, please call [phone]. (CHW)

## 2023-10-04 NOTE — Progress Notes (Signed)
 Because this visit was a virtual/telehealth visit,  certain criteria was not obtained, such a blood pressure, CBG if applicable, and timed get up and go. Any medications not marked as "taking" were not mentioned during the medication reconciliation part of the visit. Any vitals not documented were not able to be obtained due to this being a telehealth visit or patient was unable to self-report a recent blood pressure reading due to a lack of equipment at home via telehealth. Vitals that have been documented are verbally provided by the patient.   Subjective:   [name] is a 63 y.o. who presents for a Medicare Wellness preventive visit.  Visit Complete: Virtual I connected with  [name] on 10/04/23 by a audio enabled telemedicine application and verified that I am speaking with the correct person using two identifiers.  Patient Location: Home  Provider Location: Office/Clinic  I discussed the limitations of evaluation and management by telemedicine. The patient expressed understanding and agreed to proceed.  Vital Signs: Because this visit was a virtual/telehealth visit, some criteria may be missing or patient reported. Any vitals not documented were not able to be obtained and vitals that have been documented are patient reported.  VideoDeclined- This patient declined Librarian, academic. Therefore the visit was completed with audio only.  Persons Participating in Visit: Patient.  AWV Questionnaire: No: Patient Medicare AWV questionnaire was not completed prior to this visit.  Cardiac Risk Factors include: advanced age (>49men, >63 women);diabetes mellitus;family history of premature cardiovascular disease;hypertension;male gender;obesity (BMI >30kg/m2)     Objective:    Today's Vitals   10/04/23 0952  Weight: 263 lb (119.3 kg)  Height: 5\' 10"  (1.778 m)  PainSc: 0-No pain   Body mass index is 37.74 kg/m.     10/04/2023    9:55 AM  03/02/2023   10:41 AM 09/30/2022    9:38 AM 11/30/2021    9:13 AM 10/16/2020   10:31 AM 08/15/2020    1:18 PM 07/23/2019    9:35 AM  Advanced Directives  Does Patient Have a Medical Advance Directive? No No No No No Yes No  Does patient want to make changes to medical advance directive?     No - Patient declined    Would patient like information on creating a medical advance directive? No - Patient declined No - Patient declined  No - Patient declined No - Patient declined  No - Patient declined    Current Medications (verified) Outpatient Encounter Medications as of 10/04/2023  Medication Sig   Accu-Chek FastClix Lancets MISC 1 each by Other route 3 (three) times daily.   acetaminophen  (TYLENOL ) 325 MG tablet Take 1-2 tablets (325-650 mg total) by mouth every 4 (four) hours as needed for mild pain.   Agalsidase beta  (FABRAZYME  IV) Inject 115 mg into the vein every 14 (fourteen) days.    albuterol  (PROAIR  HFA) 108 (90 Base) MCG/ACT inhaler Inhale 1 puff into the lungs every 6 (six) hours as needed for wheezing or shortness of breath.   allopurinol  (ZYLOPRIM ) 100 MG tablet TAKE 3 TABLETS BY MOUTH EVERY DAY   amLODipine  (NORVASC ) 10 MG tablet Take 1 tablet (10 mg total) by mouth daily.   atorvastatin  (LIPITOR ) 80 MG tablet TAKE 1 TABLET BY MOUTH DAILY AT 6 PM.   clopidogrel  (PLAVIX ) 75 MG tablet Take 1 tablet (75 mg total) by mouth daily.   Continuous Glucose Receiver (FREESTYLE LIBRE 2 READER) DEVI Use to check blood sugar continuously. Change  sensor once every 14 days. E11.59   Continuous Glucose Sensor (FREESTYLE LIBRE SENSOR SYSTEM) MISC Use to check blood sugar continuously. Change sensor once every 14 days. E11.59   doxazosin  (CARDURA ) 2 MG tablet TAKE 0.5 TABLETS BY MOUTH DAILY.   fexofenadine  (ALLEGRA ) 180 MG tablet Take 180 mg by mouth every 14 (fourteen) days.   fluticasone  (FLONASE ) 50 MCG/ACT nasal spray PLACE 1 SPRAY INTO BOTH NOSTRILS DAILY AS NEEDED FOR ALLERGIES. (Patient not  taking: Reported on 08/29/2023)   furosemide  (LASIX ) 20 MG tablet Take 1 tablet (20 mg total) by mouth daily.   insulin  aspart protamine- aspart (NOVOLOG  MIX 70/30) (70-30) 100 UNIT/ML injection Inject 0.12 mLs (12 Units total) into the skin 2 (two) times daily with a meal.   Insulin  Pen Needle (PX SHORTLENGTH PEN NEEDLES) 31G X 8 MM MISC 1 each by Does not apply route 2 (two) times daily before a meal.   lidocaine -prilocaine  (EMLA ) cream Apply thin layer to affected area of skin daily PRN   Semaglutide , 2 MG/DOSE, 8 MG/3ML SOPN Inject 2 mg as directed once a week.   valsartan -hydrochlorothiazide  (DIOVAN -HCT) 80-12.5 MG tablet TAKE 1 TABLET BY MOUTH EVERY DAY   No facility-administered encounter medications on file as of 10/04/2023.    Allergies (verified) Lisinopril and Shellfish allergy   History: Past Medical History:  Diagnosis Date   Allergy [id number]   Dont know. 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 right worse   History of cellulitis of skin with lymphangitis LEFT LEG   HOCM (hypertrophic obstructive cardiomyopathy) (HCC)    a. Echo 11/16: Severe LVH, EF 55-60%, abnormal GLS consistent with HOCM, no SAM, mild LAE   Hypertension 20 years   Lymphedema of lower extremity LEFT  >  RIGHT   "using ankle high socks at home; pump doesn't work for me" (07/23/2014)   Pacemaker    02/12/11   Pneumonia 08/2011   Seasonal asthma NO INHALERS   30 years   Second degree Mobitz II AV block     with syncope, s/p PPM   Short of breath on exertion    Stroke (HCC) 10/23/2017   Type II diabetes mellitus (HCC) 2012   INSULIN  DEPENDENT   Past Surgical History:  Procedure Laterality Date   CARDIAC CATHETERIZATION  03-12-2011  DR Kay Parson   HYPERTROPHIC CARDIOMYOPATHY WITH LV CAVITY  APPEARANCE CONSISTANT WITH SIGNIFICANT APICAL HYPERTROPHY/ NORMAL LVSF / EF 55%/ EVIDENCE OF DIASTOLIC DYSFUNCTION WITH EDP OF 23-69mmHg AFTER A-WAVE/ NORMAL CORONARY ARTERIES   EVALUATION UNDER ANESTHESIA WITH FISTULECTOMY N/A 06/23/2015   Procedure: EXAM UNDER ANESTHESIA WITH POSSIBLE FISTULOTOMY;  Surgeon: Jerryl Morin, MD;  Location: [facility] OR;  Service: General;  Laterality: N/A;   HERNIA REPAIR  06/08/2011   Umbilical Hernia Repair   INCISION AND DRAINAGE PERIRECTAL ABSCESS Left 07/29/2014   Procedure: IRRIGATION AND DEBRIDEMENT PERIRECTAL ABSCESS;  Surgeon: Arvin Laundry, MD;  Location: [name] OR;  Service:  General;  Laterality: Left;  Prone position   INSERT / REPLACE / REMOVE PACEMAKER  02/12/2011   SJM implanted by Dr Nunzio Belch for Mobitz II second degree AV block and syncope   IR IMAGING GUIDED PORT INSERTION  03/02/2023   IRRIGATION AND DEBRIDEMENT ABSCESS N/A 07/27/2013   Procedure: IRRIGATION AND DEBRIDEMENT OF SKIN, SOFT TISSUE AND MUSCLES OF UPPER BACK (11X19X4cm) WITH 10 BLADE AND PULSATILE LAVAGE ;  Surgeon: Fran Imus, MD;  Location: [facility] OR;  Service: General;  Laterality: N/A;   PPM GENERATOR CHANGEOUT N/A 05/22/2019   Procedure: PPM GENERATOR CHANGEOUT;  Surgeon: Jolly Needle, MD;  Location: [facility] INVASIVE CV LAB;  Service: Cardiovascular;  Laterality: N/A;   UMBILICAL HERNIA REPAIR  03/22/2012   Procedure: HERNIA REPAIR UMBILICAL ADULT;  Surgeon: Joyce Nixon, MD;  Location: [facility] ORS;  Service: General;  Laterality: N/A;  Umbilical Hernia Repair    Family History  Problem Relation Age of Onset   Stroke Mother    Fabry's disease Mother    Fabry's disease Brother    Colon cancer Neg Hx    Social  History   Socioeconomic History   Marital status: Divorced    Spouse name: Not on file   Number of children: Not on file   Years of education: Not on file   Highest education level: Not on file  Occupational History   Not on file  Tobacco Use   Smoking status: Never   Smokeless tobacco: Never   Tobacco comments:    Never smoked. 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[employer]    Social Drivers of Health   Financial Resource Strain: Low Risk  (10/04/2023)   Overall Financial Resource Strain (CARDIA)    Difficulty of Paying Living Expenses: Not hard at all  Food Insecurity: No Food Insecurity (10/04/2023)   Hunger Vital Sign    Worried About Running Out of Food in the Last Year: Never true    Ran Out of Food in the Last Year: Never true  Transportation Needs: No Transportation Needs (10/04/2023)   PRAPARE - Administrator, Civil Service (Medical): No    Lack of Transportation (Non-Medical): No  Physical Activity: Insufficiently Active (10/04/2023)   Exercise Vital Sign    Days of Exercise per Week: 3 days    Minutes of Exercise per Session: 30 min  Stress: No Stress Concern Present (10/04/2023)   [employer] of Occupational Health - Occupational Stress Questionnaire    Feeling of Stress : Not at all  Social Connections: Socially Isolated (10/04/2023)   Social Connection and Isolation Panel [NHANES]    Frequency of Communication with Friends and Family: More than three times a week    Frequency of Social Gatherings with Friends and Family: Never    Attends Religious Services: Never    Database administrator or Organizations: No    Attends Banker Meetings: Never    Marital  Status: Divorced    Tobacco Counseling Counseling given: Not Answered Tobacco comments: Never smoked. Tobacco or cannabis    Clinical Intake:  Pre-visit preparation completed: Yes  Pain : No/denies pain Pain Score: 0-No  pain     BMI - recorded: 37.74 Nutritional Status: BMI > 30  Obese Nutritional Risks: None Diabetes: Yes CBG done?: No Did pt. bring in CBG monitor from home?: No  Lab Results  Component Value Date   HGBA1C 9.0 (A) 08/29/2023   HGBA1C 7.1 (A) 04/29/2023   HGBA1C 6.6 12/27/2022     How often do you need to have someone help you when you read instructions, pamphlets, or other written materials from your doctor or pharmacy?: 1 - Never  Interpreter Needed?: No  Information entered by :: Petr Bontempo N. Guhan Bruington, LPN.   Activities of Daily Living     10/04/2023    9:59 AM  In your present state of health, do you have any difficulty performing the following activities:  Hearing? 1  Vision? 0  Difficulty concentrating or making decisions? 0  Walking or climbing stairs? 1  Dressing or bathing? 0  Doing errands, shopping? 0  Preparing Food and eating ? N  Using the Toilet? N  In the past six months, have you accidently leaked urine? N  Do you have problems with loss of bowel control? N  Managing your Medications? N  Managing your Finances? N  Housekeeping or managing your Housekeeping? N    Patient Care Team: Lawrance Presume, MD as PCP - General (Internal Medicine) Arty Binning, MD (Inactive) as Consulting Physician (Cardiology) Jolly Needle, MD (Inactive) as Consulting Physician (Clinical Cardiac Electrophysiology) Charley Constable, MD as Consulting Physician (Nephrology) Devin Foerster, MD as Consulting Physician (Ophthalmology)  Indicate any recent Medical Services you may have received from other than [facility] providers in the past year (date may be approximate).     Assessment:   This is a routine wellness examination for  Morad.  Hearing/Vision screen Hearing Screening - Comments:: Patient had stroke which cause nerve damage in ears. Patient does not wear hearing aids at this time. Vision Screening - Comments:: No eyeglasses, last exam was done by [facility]. Patient has cataracts on left eye.   Goals Addressed             This Visit's Progress    Patient Stated       10/04/2023: My goal is to see my next birthday in August and to enjoy life and family.       Depression Screen     10/04/2023    9:57 AM 08/29/2023   10:59 AM 04/29/2023    8:50 AM 12/27/2022   11:22 AM 09/30/2022    9:39 AM 08/24/2022    9:43 AM 06/25/2022    2:25 PM  PHQ 2/9 Scores  PHQ - 2 Score 0 0 0 0 0 0 0  PHQ- 9 Score 0 0 0 0  0 0    Fall Risk     10/04/2023    9:57 AM 12/27/2022    9:58 AM 09/26/2022    4:26 PM 08/24/2022    9:40 AM 11/30/2021    9:14 AM  Fall Risk   Falls in the past year? 0 0 0 0 0  Number falls in past yr: 0 0 0 0 0  Injury with Fall? 0 0 0 0 0  Risk for fall due to : No Fall Risks No Fall Risks Medication side effect;Impaired mobility;Impaired balance/gait No Fall Risks   Follow up Falls prevention discussed;Falls evaluation completed  Falls prevention discussed;Education provided;Falls evaluation completed  Falls evaluation completed;Education provided;Falls prevention discussed    MEDICARE RISK AT HOME:  Medicare  Risk at Home Any stairs in or around the home?: No (ENTRANCE AND BACK WITH HANDRAILS) If so, are there any without handrails?: No Home free of loose throw rugs in walkways, pet beds, electrical cords, etc?: Yes Adequate lighting in your home to reduce risk of falls?: Yes Life alert?: No Use of a cane, walker or w/c?: Yes (CANE) Grab bars in the bathroom?: No Shower chair or bench in shower?: Yes Elevated toilet seat or a handicapped toilet?: No  TIMED UP AND GO:  Was the test performed?  No  Cognitive Function: 6CIT completed    10/04/2023    9:59 AM 11/30/2021    9:23  AM 10/16/2020   10:31 AM  MMSE - Mini Mental State Exam  Not completed: Unable to complete    Orientation to time  5 5  Orientation to Place  5 5  Registration  3 3  Attention/ Calculation  5 5  Recall  3 3  Language- name 2 objects  2 2  Language- repeat  1 1  Language- follow 3 step command  3 3  Language- read & follow direction  1 1  Write a sentence  1 1  Copy design  1 0  Total score  30 29        10/04/2023    9:59 AM 09/30/2022    9:40 AM  6CIT Screen  What Year? 0 points 0 points  What month? 0 points 0 points  What time? 0 points 0 points  Count back from 20 0 points 0 points  Months in reverse 0 points 0 points  Repeat phrase 0 points 0 points  Total Score 0 points 0 points    Immunizations Immunization History  Administered Date(s) Administered   Influenza Split 08/21/2011, 07/11/2012   Influenza, Seasonal, Injecte, Preservative Fre 04/29/2023   Influenza,inj,Quad PF,6+ Mos 04/21/2015, 05/12/2017, 05/29/2018, 05/23/2019, 07/08/2020, 02/23/2021   PFIZER(Purple Top)SARS-COV-2 Vaccination 09/13/2019, 10/08/2019, 07/11/2020   PNEUMOCOCCAL CONJUGATE-20 02/23/2021   Pneumococcal Polysaccharide-23 08/21/2011, 05/23/2019   Tdap 12/27/2022   Zoster Recombinant(Shingrix ) 10/16/2020, 08/29/2023    Screening Tests Health Maintenance  Topic Date Due   FOOT EXAM  10/27/2022   COVID-19 Vaccine (4 - 2024-25 season) 02/06/2023   OPHTHALMOLOGY EXAM  07/06/2023   Colonoscopy  01/23/2024   Diabetic kidney evaluation - Urine ACR  12/27/2023   INFLUENZA VACCINE  01/06/2024   HEMOGLOBIN A1C  02/29/2024   Diabetic kidney evaluation - eGFR measurement  07/20/2024   Medicare Annual Wellness (AWV)  10/03/2024   DTaP/Tdap/Td (2 - Td or Tdap) 12/26/2032   Pneumococcal Vaccine 83-66 Years old  Completed   Hepatitis C Screening  Completed   HIV Screening  Completed   Zoster Vaccines- Shingrix   Completed   HPV VACCINES  Aged Out   Meningococcal B Vaccine  Aged Out    Health  Maintenance  Health Maintenance Due  Topic Date Due   FOOT EXAM  10/27/2022   COVID-19 Vaccine (4 - 2024-25 season) 02/06/2023   OPHTHALMOLOGY EXAM  07/06/2023   Colonoscopy  01/23/2024   Health Maintenance Items Addressed: Referral sent to Optometry/Ophthalmology  Additional Screening:  Vision Screening: Recommended annual ophthalmology exams for early detection of glaucoma and other disorders of the eye.  Dental Screening: Recommended annual dental exams for proper oral hygiene  Community Resource Referral / Chronic Care Management: CRR required this visit?  No   CCM required this visit?  No     Plan:     I have personally  reviewed and noted the following in the patient's chart:   Medical and social history Use of alcohol, tobacco or illicit drugs  Current medications and supplements including opioid prescriptions. Patient is not currently taking opioid prescriptions. Functional ability and status Nutritional status Physical activity Advanced directives List of other physicians Hospitalizations, surgeries, and ER visits in previous 12 months Vitals Screenings to include cognitive, depression, and falls Referrals and appointments  In addition, I have reviewed and discussed with patient certain preventive protocols, quality metrics, and best practice recommendations. A written personalized care plan for preventive services as well as general preventive health recommendations were provided to patient.     Margette Sheldon, LPN   1/61/0960   After Visit Summary: (MyChart) Due to this being a telephonic visit, the after visit summary with patients personalized plan was offered to patient via MyChart   Notes: Please refer to Routing Comments.

## 2023-10-13 ENCOUNTER — Other Ambulatory Visit: Payer: Self-pay | Admitting: Internal Medicine

## 2023-10-13 DIAGNOSIS — G463 Brain stem stroke syndrome: Secondary | ICD-10-CM

## 2023-10-15 ENCOUNTER — Other Ambulatory Visit: Payer: Self-pay | Admitting: Internal Medicine

## 2023-10-15 DIAGNOSIS — I152 Hypertension secondary to endocrine disorders: Secondary | ICD-10-CM

## 2023-10-17 DIAGNOSIS — E7521 Fabry (-Anderson) disease: Secondary | ICD-10-CM | POA: Diagnosis not present

## 2023-10-23 ENCOUNTER — Other Ambulatory Visit: Payer: Self-pay | Admitting: Internal Medicine

## 2023-10-23 DIAGNOSIS — I152 Hypertension secondary to endocrine disorders: Secondary | ICD-10-CM

## 2023-10-23 DIAGNOSIS — I5032 Chronic diastolic (congestive) heart failure: Secondary | ICD-10-CM

## 2023-10-25 ENCOUNTER — Other Ambulatory Visit: Payer: Self-pay

## 2023-11-01 ENCOUNTER — Encounter: Payer: Self-pay | Admitting: Pharmacist

## 2023-11-01 ENCOUNTER — Ambulatory Visit: Payer: Self-pay | Attending: Internal Medicine | Admitting: Pharmacist

## 2023-11-01 ENCOUNTER — Telehealth: Payer: Self-pay | Admitting: Pharmacist

## 2023-11-01 DIAGNOSIS — Z794 Long term (current) use of insulin: Secondary | ICD-10-CM

## 2023-11-01 DIAGNOSIS — E669 Obesity, unspecified: Secondary | ICD-10-CM

## 2023-11-01 DIAGNOSIS — E1169 Type 2 diabetes mellitus with other specified complication: Secondary | ICD-10-CM | POA: Diagnosis not present

## 2023-11-01 DIAGNOSIS — Z7985 Long-term (current) use of injectable non-insulin antidiabetic drugs: Secondary | ICD-10-CM

## 2023-11-01 MED ORDER — FREESTYLE LIBRE 3 READER DEVI: 0 refills | Status: DC

## 2023-11-01 MED ORDER — FREESTYLE LIBRE 3 PLUS SENSOR MISC: 6 refills | Status: AC

## 2023-11-01 NOTE — Progress Notes (Signed)
 Pharmacy TNM Diabetes Measure Review  S:  Patient was identified in a report as being at risk for failing the True [place] Metric of A1c control. Last A1c was 9 on 08/29/2023. Last PCP visit was 08/29/2023. At that time, patient was taking 1 mg weekly of Ozempic  due to the 2 mg dose being unavailable. His patient assistance stock had not come in at the time. He was instructed to come see me. I saw him on 09/29/2023. He received a 90-ds of Ozempic  2 mg on 09/09/2023.    Today, pt reports doing well. Denies any NV, abdominal pain. He did not bring his receiver in today but has a daily log of home CBG readings with him for review.   Current diabetes medications include: Novolog  70/30 12u BID, Ozempic  2 mg weekly Patient reports adherence to taking all medications as prescribed.   Insurance coverage: [employer] Medicare  Patient denies hypoglycemic events.  Patient reported dietary habits:  -Continues to limit juice and limits even "zero calorie" beverages.  -Has increased his water  intake -Does admit that he's a snacker. Tells me that sometimes at night, he'll get up around 9-10p and make a sandwich before going back to bed.   Patient-reported exercise habits: none reported  O:   Reported home blood sugars:  4/25: no data 4/28: 112, 186 4/29: 103 4/30: 200 5/1: 115, 109 5/2: 134, 119 5/3: 105 5/4: 105, 130 5/5: 150, 188 5/6: 123, 110 5/7: 125 5/8: 155, 127 5/9: 136 5/10: 119 5/11: no data  5/12: 104 5/13: no data  5/14: 143 5/15: 161, 134  5/16: 116, 117 5/17: 111, 134 5/18: 121 5/19: 136 5/20: 168 5/21: 151 5/22: 117 5/23: 110 5/24: 126 5/26: 134 5/27: 179  Avg: 135 mg/dL  Lab Results  Component Value Date   HGBA1C 9.0 (A) 08/29/2023   There were no vitals filed for this visit.  Lipid Panel     Component Value Date/Time   CHOL 168 10/26/2021 0945   TRIG 80 10/26/2021 0945   HDL 62 10/26/2021 0945   CHOLHDL 2.7 10/26/2021 0945   CHOLHDL 4.4 10/20/2018 0324    VLDL 18 10/20/2018 0324   LDLCALC 91 10/26/2021 0945    Clinical Atherosclerotic Cardiovascular Disease (ASCVD): Yes  The ASCVD Risk score (Arnett DK, et al., 2019) failed to calculate for the following reasons:   Risk score cannot be calculated because patient has a medical history suggesting prior/existing ASCVD   Patient is participating in a Managed Medicaid Plan: No   A/P: Diabetes longstanding currently uncontrolled with his current A1c of 9.0%. Luckily, his MAP stock of Ozempic  2 mg was dispensed last  month and he is tolerating this well. He is asymptomatic from a hyper- or hypoglycemia standpoint. Home CBG log shows an avg glucose in the 130s, which is certainly an improvement. Patient is able to verbalize appropriate hypoglycemia management plan. Medication adherence appears to be appropriate.  -Continued Ozempic  2 mg weekly.  -Continued Novolog  70/30 12 units BID for now.  -Patient educated on purpose, proper use, and potential adverse effects of Ozempic .  -Extensively discussed pathophysiology of diabetes, recommended lifestyle interventions, dietary effects on blood sugar control.  -Counseled on s/sx of and management of hypoglycemia.  -Next A1c anticipated 11/2023.   Follow-up:  Pharmacist in 1 month. PCP clinic visit in 12/29/2023.  Marene Shape, PharmD, Becky Bowels, CPP Clinical Pharmacist [facility] [phone]

## 2023-11-01 NOTE — Telephone Encounter (Signed)
 Patient was approved for [place] 2 but these are being phased out. New rxn sent for [facility] plus along with the [facility] reader. He has Medicare part B and I sent these to [facility] for part B billing. With that being said, we wouldn't need to complete a PA as well would we?

## 2023-11-04 DIAGNOSIS — E7521 Fabry (-Anderson) disease: Secondary | ICD-10-CM | POA: Diagnosis not present

## 2023-11-05 ENCOUNTER — Encounter: Payer: Self-pay | Admitting: Internal Medicine

## 2023-11-05 ENCOUNTER — Other Ambulatory Visit: Payer: Self-pay | Admitting: Nurse Practitioner

## 2023-11-10 DIAGNOSIS — I4891 Unspecified atrial fibrillation: Secondary | ICD-10-CM | POA: Diagnosis not present

## 2023-11-10 DIAGNOSIS — I509 Heart failure, unspecified: Secondary | ICD-10-CM | POA: Diagnosis not present

## 2023-11-10 DIAGNOSIS — I4892 Unspecified atrial flutter: Secondary | ICD-10-CM | POA: Diagnosis not present

## 2023-11-10 DIAGNOSIS — I130 Hypertensive heart and chronic kidney disease with heart failure and stage 1 through stage 4 chronic kidney disease, or unspecified chronic kidney disease: Secondary | ICD-10-CM | POA: Diagnosis not present

## 2023-11-10 DIAGNOSIS — Z008 Encounter for other general examination: Secondary | ICD-10-CM | POA: Diagnosis not present

## 2023-11-10 DIAGNOSIS — N1832 Chronic kidney disease, stage 3b: Secondary | ICD-10-CM | POA: Diagnosis not present

## 2023-11-10 DIAGNOSIS — I495 Sick sinus syndrome: Secondary | ICD-10-CM | POA: Diagnosis not present

## 2023-11-10 DIAGNOSIS — E1142 Type 2 diabetes mellitus with diabetic polyneuropathy: Secondary | ICD-10-CM | POA: Diagnosis not present

## 2023-11-10 DIAGNOSIS — J4489 Other specified chronic obstructive pulmonary disease: Secondary | ICD-10-CM | POA: Diagnosis not present

## 2023-11-10 DIAGNOSIS — I700 Atherosclerosis of aorta: Secondary | ICD-10-CM | POA: Diagnosis not present

## 2023-11-10 DIAGNOSIS — I422 Other hypertrophic cardiomyopathy: Secondary | ICD-10-CM | POA: Diagnosis not present

## 2023-11-10 DIAGNOSIS — Z794 Long term (current) use of insulin: Secondary | ICD-10-CM | POA: Diagnosis not present

## 2023-11-10 NOTE — Addendum Note (Signed)
 Addended by: Edra Govern D on: 11/10/2023 04:31 PM   Modules accepted: Orders

## 2023-11-10 NOTE — Progress Notes (Signed)
 Remote pacemaker transmission.

## 2023-11-16 DIAGNOSIS — I441 Atrioventricular block, second degree: Secondary | ICD-10-CM | POA: Diagnosis not present

## 2023-11-16 DIAGNOSIS — M109 Gout, unspecified: Secondary | ICD-10-CM | POA: Diagnosis not present

## 2023-11-16 DIAGNOSIS — I890 Lymphedema, not elsewhere classified: Secondary | ICD-10-CM | POA: Diagnosis not present

## 2023-11-16 DIAGNOSIS — E118 Type 2 diabetes mellitus with unspecified complications: Secondary | ICD-10-CM | POA: Diagnosis not present

## 2023-11-16 DIAGNOSIS — I129 Hypertensive chronic kidney disease with stage 1 through stage 4 chronic kidney disease, or unspecified chronic kidney disease: Secondary | ICD-10-CM | POA: Diagnosis not present

## 2023-11-16 DIAGNOSIS — I639 Cerebral infarction, unspecified: Secondary | ICD-10-CM | POA: Diagnosis not present

## 2023-11-16 DIAGNOSIS — N1831 Chronic kidney disease, stage 3a: Secondary | ICD-10-CM | POA: Diagnosis not present

## 2023-11-16 DIAGNOSIS — E7521 Fabry (-Anderson) disease: Secondary | ICD-10-CM | POA: Diagnosis not present
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
## 2023-11-17 ENCOUNTER — Encounter: Payer: Self-pay | Admitting: Neurology

## 2023-11-17 ENCOUNTER — Ambulatory Visit (INDEPENDENT_AMBULATORY_CARE_PROVIDER_SITE_OTHER): Payer: Medicare HMO | Admitting: Neurology

## 2023-11-17 VITALS — BP 128/74 | HR 63 | Resp 17 | Ht 70.0 in | Wt 263.0 lb

## 2023-11-17 DIAGNOSIS — E7521 Fabry (-Anderson) disease: Secondary | ICD-10-CM

## 2023-11-17 DIAGNOSIS — I6302 Cerebral infarction due to thrombosis of basilar artery: Secondary | ICD-10-CM | POA: Diagnosis not present

## 2023-11-17 LAB — LAB REPORT - SCANNED
Creatinine, POC: 84.9 mg/dL
EGFR: 39

## 2023-11-17 NOTE — Patient Instructions (Signed)
 Great to see you today.  Please continue close follow-up with primary care for management of vascular risk factors. Strict management of vascular risk factors with a goal BP less than 130/90, A1c less than 7.0, LDL less than 70 for secondary stroke prevention.  Follow-up here for new issues.

## 2023-11-17 NOTE — Progress Notes (Signed)
 Chart reviewed, agree above plan ?

## 2023-11-18 DIAGNOSIS — E7521 Fabry (-Anderson) disease: Secondary | ICD-10-CM | POA: Diagnosis not present

## 2023-11-26 ENCOUNTER — Other Ambulatory Visit: Payer: Self-pay | Admitting: Internal Medicine

## 2023-11-26 DIAGNOSIS — I6930 Unspecified sequelae of cerebral infarction: Secondary | ICD-10-CM

## 2023-11-27 ENCOUNTER — Encounter: Payer: Self-pay | Admitting: Internal Medicine

## 2023-12-02 DIAGNOSIS — E7521 Fabry (-Anderson) disease: Secondary | ICD-10-CM | POA: Diagnosis not present

## 2023-12-05 ENCOUNTER — Encounter: Payer: Self-pay | Admitting: Pharmacist

## 2023-12-05 ENCOUNTER — Ambulatory Visit: Attending: Family Medicine | Admitting: Pharmacist

## 2023-12-05 DIAGNOSIS — E785 Hyperlipidemia, unspecified: Secondary | ICD-10-CM | POA: Diagnosis not present

## 2023-12-05 DIAGNOSIS — Z794 Long term (current) use of insulin: Secondary | ICD-10-CM | POA: Diagnosis not present

## 2023-12-05 DIAGNOSIS — E1169 Type 2 diabetes mellitus with other specified complication: Secondary | ICD-10-CM

## 2023-12-05 DIAGNOSIS — E669 Obesity, unspecified: Secondary | ICD-10-CM | POA: Diagnosis not present

## 2023-12-05 DIAGNOSIS — Z7984 Long term (current) use of oral hypoglycemic drugs: Secondary | ICD-10-CM

## 2023-12-05 LAB — POCT GLYCOSYLATED HEMOGLOBIN (HGB A1C): HbA1c, POC (controlled diabetic range): 7.3 % — AB (ref 0.0–7.0)

## 2023-12-05 NOTE — Progress Notes (Signed)
 Pharmacy TNM Diabetes Measure Review  S:  Patient was identified in a report as being at risk for failing the True [place] Metric of A1c control.   Patient was last seen and referred by his PCP on 08/29/2023. At that time, patient was taking 1 mg weekly of Ozempic  due to the 2 mg dose being unavailable. His patient assistance stock had not come in at the time. He was instructed to come see me.   I saw him on 4/24 and 5/27. I assist in coordinating his refills and by reviewing his CGM data.  Today, pt reports doing well. Denies any NV, abdominal pain. He did not bring his receiver in today, but his A1c is 7.3%, down from 9.0%. Even though this is still slightly above goal, he is now passing our TNM by having his A1c < 8.  Current diabetes medications include: Novolog  70/30 12u BID, Ozempic  2 mg weekly Patient reports adherence to taking all medications as prescribed.   Insurance coverage: Aetna Medicare  Patient denies hypoglycemic events.  Patient reported dietary habits:  -Continues to limit juice and limits even zero calorie beverages.  -Has increased his water  intake -Does admit that he's a snacker. Tells me that sometimes at night, he'll get up around 9-10p and make a sandwich before going back to bed.   Patient-reported exercise habits: none reported  O:  Reported home blood sugars:    Lab Results  Component Value Date   HGBA1C 7.3 (A) 12/05/2023   There were no vitals filed for this visit.  Lipid Panel     Component Value Date/Time   CHOL 168 10/26/2021 0945   TRIG 80 10/26/2021 0945   HDL 62 10/26/2021 0945   CHOLHDL 2.7 10/26/2021 0945   CHOLHDL 4.4 10/20/2018 0324   VLDL 18 10/20/2018 0324   LDLCALC 91 10/26/2021 0945    Clinical Atherosclerotic Cardiovascular Disease (ASCVD): Yes  The ASCVD Risk score (Arnett DK, et al., 2019) failed to calculate for the following reasons:   Risk score cannot be calculated because patient has a medical history suggesting  prior/existing ASCVD   Patient is participating in a Managed Medicaid Plan: No   A/P: Diabetes longstanding currently close to goal with his A1c of 7.3% today. Commended him for this. He is not due for Ozempic  refills until 12/30/23. I set a reminder to contact our pharmacy to fill these on his behalf. He is asymptomatic from a hyper- or hypoglycemia standpoint. Patient is able to verbalize appropriate hypoglycemia management plan. Medication adherence appears to be appropriate.  -Continued Ozempic  2 mg weekly.  -Continued Novolog  70/30 12 units BID for now.  -Patient educated on purpose, proper use, and potential adverse effects of Ozempic .  -Extensively discussed pathophysiology of diabetes, recommended lifestyle interventions, dietary effects on blood sugar control.  -Counseled on s/sx of and management of hypoglycemia.  -Next A1c anticipated 02/2024.   Follow-up:  Pharmacist in 2 months. PCP clinic visit in 12/29/2023.  Herlene Fleeta Morris, PharmD, JAQUELINE, CPP Clinical Pharmacist [facility] [phone]

## 2023-12-06 ENCOUNTER — Ambulatory Visit: Payer: Self-pay | Admitting: Internal Medicine

## 2023-12-06 LAB — LIPID PANEL
Chol/HDL Ratio: 2.4 ratio (ref 0.0–5.0)
Cholesterol, Total: 153 mg/dL (ref 100–199)
HDL: 63 mg/dL (ref >39)
LDL Chol Calc (NIH): 79 mg/dL (ref 0–99)
Triglycerides: 53 mg/dL (ref 0–149)
VLDL Cholesterol Cal: 11 mg/dL (ref 5–40)

## 2023-12-10 ENCOUNTER — Other Ambulatory Visit: Payer: Self-pay | Admitting: Internal Medicine

## 2023-12-10 DIAGNOSIS — E669 Obesity, unspecified: Secondary | ICD-10-CM

## 2023-12-16 DIAGNOSIS — E7521 Fabry (-Anderson) disease: Secondary | ICD-10-CM | POA: Diagnosis not present

## 2023-12-19 ENCOUNTER — Ambulatory Visit: Payer: Medicare HMO

## 2023-12-19 DIAGNOSIS — I441 Atrioventricular block, second degree: Secondary | ICD-10-CM | POA: Diagnosis not present

## 2023-12-20 ENCOUNTER — Ambulatory Visit: Payer: Self-pay | Admitting: Cardiology

## 2023-12-20 LAB — CUP PACEART REMOTE DEVICE CHECK
Battery Remaining Longevity: 31 mo
Battery Remaining Percentage: 37 %
Battery Voltage: 2.96 V
Brady Statistic AP VP Percent: 81 %
Brady Statistic AP VS Percent: 1 %
Brady Statistic AS VP Percent: 19 %
Brady Statistic AS VS Percent: 1 %
Brady Statistic RA Percent Paced: 80 %
Brady Statistic RV Percent Paced: 99 %
Date Time Interrogation Session: 20250714162301
Implantable Lead Connection Status: 753985
Implantable Lead Connection Status: 753985
Implantable Lead Implant Date: 20120907
Implantable Lead Implant Date: 20120907
Implantable Lead Location: 753859
Implantable Lead Location: 753860
Implantable Pulse Generator Implant Date: 20201215
Lead Channel Impedance Value: 300 Ohm
Lead Channel Impedance Value: 360 Ohm
Lead Channel Pacing Threshold Amplitude: 0.75 V
Lead Channel Pacing Threshold Amplitude: 2 V
Lead Channel Pacing Threshold Pulse Width: 0.5 ms
Lead Channel Pacing Threshold Pulse Width: 0.8 ms
Lead Channel Sensing Intrinsic Amplitude: 5 mV
Lead Channel Sensing Intrinsic Amplitude: 9.1 mV
Lead Channel Setting Pacing Amplitude: 1.75 V
Lead Channel Setting Pacing Amplitude: 2.25 V
Lead Channel Setting Pacing Pulse Width: 0.8 ms
Lead Channel Setting Sensing Sensitivity: 4 mV
Pulse Gen Model: 2272
Pulse Gen Serial Number: 9187005

## 2023-12-25 ENCOUNTER — Other Ambulatory Visit: Payer: Self-pay | Admitting: Internal Medicine

## 2023-12-25 DIAGNOSIS — R3589 Other polyuria: Secondary | ICD-10-CM

## 2023-12-26 ENCOUNTER — Encounter: Payer: Self-pay | Admitting: Internal Medicine

## 2023-12-27 ENCOUNTER — Telehealth: Payer: Self-pay | Admitting: Internal Medicine

## 2023-12-27 NOTE — Telephone Encounter (Signed)
 Called pt to confirm appt for 7/24 LVM

## 2023-12-28 ENCOUNTER — Other Ambulatory Visit: Payer: Self-pay | Admitting: Pharmacist

## 2023-12-28 ENCOUNTER — Other Ambulatory Visit: Payer: Self-pay | Admitting: Internal Medicine

## 2023-12-28 ENCOUNTER — Other Ambulatory Visit: Payer: Self-pay

## 2023-12-28 MED ORDER — INSULIN ASPART PROT & ASPART (70-30 MIX) 100 UNIT/ML ~~LOC~~ SUSP: 12.0000 [IU] | Freq: Two times a day (BID) | SUBCUTANEOUS | 3 refills | Status: DC

## 2023-12-29 ENCOUNTER — Ambulatory Visit: Attending: Internal Medicine | Admitting: Internal Medicine

## 2023-12-29 ENCOUNTER — Encounter: Payer: Self-pay | Admitting: Internal Medicine

## 2023-12-29 DIAGNOSIS — E119 Type 2 diabetes mellitus without complications: Secondary | ICD-10-CM

## 2023-12-29 DIAGNOSIS — R053 Chronic cough: Secondary | ICD-10-CM

## 2023-12-29 DIAGNOSIS — I5032 Chronic diastolic (congestive) heart failure: Secondary | ICD-10-CM

## 2023-12-29 DIAGNOSIS — I10 Essential (primary) hypertension: Secondary | ICD-10-CM | POA: Diagnosis not present

## 2023-12-29 DIAGNOSIS — E785 Hyperlipidemia, unspecified: Secondary | ICD-10-CM | POA: Diagnosis not present

## 2023-12-29 DIAGNOSIS — I129 Hypertensive chronic kidney disease with stage 1 through stage 4 chronic kidney disease, or unspecified chronic kidney disease: Secondary | ICD-10-CM

## 2023-12-29 DIAGNOSIS — Z7985 Long-term (current) use of injectable non-insulin antidiabetic drugs: Secondary | ICD-10-CM | POA: Diagnosis not present

## 2023-12-29 DIAGNOSIS — E1159 Type 2 diabetes mellitus with other circulatory complications: Secondary | ICD-10-CM

## 2023-12-29 DIAGNOSIS — R0982 Postnasal drip: Secondary | ICD-10-CM | POA: Diagnosis not present

## 2023-12-29 DIAGNOSIS — N1832 Chronic kidney disease, stage 3b: Secondary | ICD-10-CM | POA: Diagnosis not present

## 2023-12-29 DIAGNOSIS — Z1211 Encounter for screening for malignant neoplasm of colon: Secondary | ICD-10-CM

## 2023-12-29 DIAGNOSIS — Z6837 Body mass index (BMI) 37.0-37.9, adult: Secondary | ICD-10-CM | POA: Diagnosis not present

## 2023-12-29 DIAGNOSIS — E1169 Type 2 diabetes mellitus with other specified complication: Secondary | ICD-10-CM

## 2023-12-29 MED ORDER — ACCU-CHEK SOFTCLIX LANCETS MISC
12 refills | Status: AC
Start: 2023-12-29 — End: ?

## 2023-12-29 MED ORDER — ACCU-CHEK GUIDE W/DEVICE KIT: PACK | 0 refills | Status: AC

## 2023-12-29 MED ORDER — FLUTICASONE PROPIONATE 50 MCG/ACT NA SUSP: 1.0000 | Freq: Every day | NASAL | 1 refills | Status: DC | PRN

## 2023-12-29 MED ORDER — ACCU-CHEK GUIDE TEST VI STRP: ORAL_STRIP | 12 refills | Status: AC

## 2023-12-29 NOTE — Progress Notes (Signed)
 [name], male    DOB: December 28, 1960  MRN: [id number]  CC: Diabetes (DM f/u. /No questions / concerns)   Subjective: [name] is a 63 y.o. male who presents for chronic ds management. His concerns today include:  Patient with history of diabetes type 2 with microalbuminuria, diastolic CHF with pacemaker (secconary to AV block with syncopy), CKD stage III, HOCM, HTN, HL, CVA, lymphedema. 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    Discussed the use of AI scribe software for clinical note transcription with the patient, who gave verbal consent to proceed.  History of Present Illness [name] is a 63 year old male with diabetes, hypertension, and hyperlipidemia who presents for a follow-up visit.  DM: His A1c has decreased from 9 to 7.3 since March. He resumed Ozempic  2 mg for at least two months after finding a supply in his refrigerator. He has not used Novolog  70/30 insulin  for the past two months, believing the plan was to stop it once he resumed Ozempic . He has not been able to use his Libre continuous glucose monitor due to technical issues and has not been checking his blood sugars due to a lack of strips and a glucometer. He has been mindful of his diet, avoiding sodas and juices, and increasing water  intake.  HTN/CHF: He is currently on amlodipine  10 mg daily, doxazosin  2 mg half a tablet daily, furosemide  20 mg daily, valsartan  hydrochlorothiazide  80/12.5 mg daily, and atorvastatin  80 mg daily. He did not take his morning medications as yet for the morning.  HL:  His LDL cholesterol level three weeks ago was 79, with a goal of 55. He takes atorvastatin  consistently.  He mentions a diagnosis of COPD made by a nurse practitioner during a home visit, although he has never smoked. He attributes his pulmonary issues to exposure to fumes and foam during his [employer].  He plans to work with a Clinical research associate who does pro bono work  to  get some reconciliation from [place]. He experiences a consistent cough, nasal congestion, and postnasal drip. He uses Vicks VapoRub and Allegra  for symptom relief, with Allegra  providing relief for about four to six hours. He does not use Allegra  every day. No shortness of breath or chest pain. He reports itchy eyes and nose but denies an itchy throat.  CKD 3b: He saw his nephrologist last month.  GFR remains in the 30s with most recent 1 being 39.   He continues to receive enzyme infusions every two weeks for Fabry's disease.  HM: He is due for a colonoscopy in August, ten years after his last one, which revealed a benign rectal polyp.  He is not wanting to do a colonoscopy.  He states that the nurse practitioner who came to his house from his insurance gave him a stool kit.  This sounds like the fit test.  He states he plans to use that and turn it in.      Patient Active Problem List   Diagnosis Date Noted   Tetanus, diphtheria, and acellular pertussis (Tdap) vaccination declined 08/24/2022   Atrial flutter, unspecified type (HCC) 06/26/2022   Post-nasal drip 10/26/2021   Chronic sinusitis 07/01/2021   Contact dermatitis and other eczema 07/01/2021   Edema 07/01/2021   Other and unspecified hyperlipidemia 07/01/2021   Proteinuria 07/01/2021   Macroalbuminuric diabetic nephropathy (HCC) 11/08/2020   Diabetes mellitus (HCC) 11/07/2020   Hypertension associated with stage 3a chronic kidney  disease due to type 2 diabetes mellitus (HCC) 11/07/2020   Influenza vaccine needed 07/08/2020   Spinal stenosis of lumbar region without neurogenic claudication 07/08/2020   Degenerative spondylolisthesis 05/20/2020   Acute left-sided low back pain with left-sided sciatica 05/08/2020   Reactive airway disease 01/24/2020   Pain due to onychomycosis of toenails of both feet 10/17/2019   Morbid obesity (HCC) 08/23/2019   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   CVA (cerebral vascular accident) (HCC)  10/20/2018   UTI (urinary tract infection) 10/19/2018   Upper airway cough syndrome 08/01/2018   Lymphedema 10/06/2016   Gout, arthritis    Anorectal fistula 06/23/2015   Onychomycosis of toenail 09/26/2014   Type 2 diabetes mellitus with obesity (HCC)    Iron deficiency anemia 07/29/2014   CKD (chronic kidney disease), stage III (HCC)    Chronic diastolic HF (heart failure) (HCC) 03/27/2013   Peripheral edema 07/20/2012   Obstructive sleep apnea, suspected 06/21/2012   Pacemaker-St.Jude 02/23/2012   Hypertension 08/09/2011   Mobitz type II atrioventricular block 05/19/2011   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 102 each 12   acetaminophen  (TYLENOL ) 325 MG tablet Take 1-2 tablets (325-650 mg total) by mouth every 4 (four) hours as needed for mild pain.     Agalsidase beta  (FABRAZYME  IV) Inject 115 mg into the vein every 14 (fourteen) days.      albuterol  (PROAIR  HFA) 108 (90 Base) MCG/ACT inhaler Inhale 1 puff into the lungs every 6 (six) hours as needed for wheezing or shortness of breath. 8.5 each 1   allopurinol  (ZYLOPRIM ) 100 MG tablet TAKE 3 TABLETS BY MOUTH EVERY DAY 270 tablet 1   amLODipine  (NORVASC ) 10 MG tablet Take 1 tablet (10 mg total) by mouth daily. 90 tablet 1   atorvastatin  (LIPITOR ) 80 MG tablet TAKE 1 TABLET BY MOUTH DAILY AT 6 PM. 90 tablet 2   clopidogrel  (PLAVIX ) 75 MG tablet TAKE 1 TABLET BY MOUTH EVERY DAY 90 tablet 2   Continuous Glucose Receiver (FREESTYLE LIBRE 3 READER) DEVI USE TO CHECK BLOOD SUGAR CONTINUOUSLY. E11.59 1 each 0   Continuous Glucose Sensor (FREESTYLE LIBRE 3 PLUS SENSOR) MISC Use to check blood sugar continuously. Change sensor once every 15 days. E11.59 2 each 6   doxazosin  (CARDURA ) 2 MG tablet TAKE 1/2 TABLET BY MOUTH DAILY 45 tablet 1   fexofenadine  (ALLEGRA ) 180 MG tablet  Take 180 mg by mouth every 14 (fourteen) days.     fluticasone  (FLONASE ) 50 MCG/ACT nasal spray PLACE 1 SPRAY INTO BOTH NOSTRILS DAILY AS NEEDED FOR ALLERGIES. 48 mL 1   furosemide  (LASIX ) 20 MG tablet TAKE 1 TABLET BY MOUTH EVERY DAY 90 tablet 0   insulin  aspart protamine- aspart (NOVOLOG  MIX 70/30) (70-30) 100 UNIT/ML injection Inject 0.12 mLs (12 Units total) into the skin 2 (two) times daily with a meal. 10 mL 3   Insulin  Pen Needle (PX SHORTLENGTH PEN NEEDLES) 31G X 8 MM MISC 1 each by Does not apply route 2 (two) times daily before a meal. 100 each 12   lidocaine -prilocaine  (EMLA ) cream APPLY THIN LAYER TO AFFECTED AREA OF SKIN DAILY AS NEEDED 30 g 0   Semaglutide , 2 MG/DOSE, 8 MG/3ML SOPN Inject 2 mg as directed once a week. 3 mL 4   valsartan -hydrochlorothiazide  (DIOVAN -HCT) 80-12.5 MG tablet TAKE 1 TABLET  BY MOUTH EVERY DAY 90 tablet 1   No current facility-administered medications on file prior to visit.    Allergies  Allergen Reactions   Lisinopril Anaphylaxis   Shellfish Allergy Hives and Swelling    Social History   Socioeconomic History   Marital status: Divorced    Spouse name: Not on file   Number of children: Not on file   Years of education: Not on file   Highest education level: Not on file  Occupational History   Not on file  Tobacco Use   Smoking status: Never   Smokeless tobacco: Never   Tobacco comments:    Never smoked. 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    Social Drivers of Health   Financial Resource Strain: Low Risk  (10/04/2023)   Overall Financial Resource Strain (CARDIA)    Difficulty of Paying Living Expenses: Not hard at all  Food Insecurity: No Food  Insecurity (10/04/2023)   Hunger Vital Sign    Worried About Running Out of Food in the Last Year: Never true    Ran Out of Food in the Last Year: Never true  Transportation Needs: No Transportation Needs (10/04/2023)   PRAPARE - Administrator, Civil Service (Medical): No    Lack of Transportation (Non-Medical): No  Physical Activity: Insufficiently Active (10/04/2023)   Exercise Vital Sign    Days of Exercise per Week: 3 days    Minutes of Exercise per Session: 30 min  Stress: No Stress Concern Present (10/04/2023)   [employer] of Occupational Health - Occupational Stress Questionnaire    Feeling of Stress : Not at all  Social Connections: Socially Isolated (10/04/2023)   Social Connection and Isolation Panel    Frequency of Communication with Friends and Family: More than three times a week    Frequency of Social Gatherings with Friends and Family: Never    Attends Religious Services: Never    Database administrator or Organizations: No    Attends Banker Meetings: Never    Marital Status: Divorced  Catering manager Violence: Not At Risk (10/04/2023)   Humiliation, Afraid, Rape, and Kick questionnaire    Fear of Current or Ex-Partner: No    Emotionally Abused: No    Physically Abused: No    Sexually Abused: No    Family History  Problem Relation Age of Onset   Stroke Mother    Fabry's disease Mother    Fabry's disease Brother    Colon cancer Neg Hx     Past Surgical History:  Procedure Laterality Date   CARDIAC CATHETERIZATION  03-12-2011  DR VICTORY SHARPS   HYPERTROPHIC CARDIOMYOPATHY WITH LV CAVITY  APPEARANCE CONSISTANT WITH SIGNIFICANT APICAL HYPERTROPHY/ NORMAL LVSF / EF 55%/ EVIDENCE OF DIASTOLIC DYSFUNCTION WITH EDP OF 23-2mmHg AFTER A-WAVE/ NORMAL CORONARY ARTERIES   EVALUATION UNDER ANESTHESIA WITH FISTULECTOMY N/A 06/23/2015   Procedure: EXAM UNDER ANESTHESIA WITH POSSIBLE FISTULOTOMY;  Surgeon: Lynwood Pina, MD;  Location: [facility] OR;   Service: General;  Laterality: N/A;   HERNIA REPAIR  06/08/2011   Umbilical Hernia Repair   INCISION AND DRAINAGE PERIRECTAL ABSCESS Left 07/29/2014  Procedure: IRRIGATION AND DEBRIDEMENT PERIRECTAL ABSCESS;  Surgeon: Gordy Pina, MD;  Location: [facility] OR;  Service: General;  Laterality: Left;  Prone position   INSERT / REPLACE / REMOVE PACEMAKER  02/12/2011   SJM implanted by Dr Kelsie for Mobitz II second degree AV block and syncope   IR IMAGING GUIDED PORT INSERTION  03/02/2023   IRRIGATION AND DEBRIDEMENT ABSCESS N/A 07/27/2013   Procedure: IRRIGATION AND DEBRIDEMENT OF SKIN, SOFT TISSUE AND MUSCLES OF UPPER BACK (11X19X4cm) WITH 10 BLADE AND PULSATILE LAVAGE ;  Surgeon: Camellia CHRISTELLA Blush, MD;  Location[facility] OR;  Service: General;  Laterality: N/A;   PPM GENERATOR CHANGEOUT N/A 05/22/2019   Procedure: PPM GENERATOR CHANGEOUT;  Surgeon: Kelsie Agent, MD;  Location: [facility] INVASIVE CV LAB;  Service: Cardiovascular;  Laterality: N/A;   UMBILICAL HERNIA REPAIR  03/22/2012   Procedure: HERNIA REPAIR UMBILICAL ADULT;  Surgeon: Bernarda Ned, MD;  Location: [facility] ORS;  Service: General;  Laterality: N/A;  Umbilical Hernia Repair     ROS: Review of Systems Negative except as stated above  PHYSICAL EXAM: BP 139/83 (BP Location: Left Arm, Patient Position: Sitting, Cuff Size: Large)   Pulse 65   Temp 97.6 F (36.4 C) (Oral)   Ht 5' 10 (1.778 m)   Wt 263 lb (119.3 kg)   SpO2 99%   BMI 37.74 kg/m   Wt Readings from Last 3 Encounters:  12/29/23 263 lb (119.3 kg)  11/17/23 263 lb (119.3 kg)  10/04/23 263 lb (119.3 kg)    Physical Exam   General appearance - alert, well appearing, and in no distress Mental status - normal mood, behavior, speech, dress, motor activity, and thought processes Nose - normal and patent, no erythema, discharge or polyps Mouth - mucous membranes moist, pharynx normal without lesions Neck - supple, no significant adenopathy Ext: legs wraped in Velcrow    Latest Ref Rng &  Units 06/25/2022    3:32 PM 10/26/2021    9:45 AM 02/23/2021   10:26 AM  CMP  Glucose 70 - 99 mg/dL 589  888  830   BUN 8 - 27 mg/dL 30  20  27    Creatinine 0.76 - 1.27 mg/dL 7.73  8.13  8.11   Sodium 134 - 144 mmol/L 140  149  146   Potassium 3.5 - 5.2 mmol/L 4.5  4.3  4.7   Chloride 96 - 106 mmol/L 100  109  107   CO2 20 - 29 mmol/L 22  22  24    Calcium  8.6 - 10.2 mg/dL 9.6  9.8  9.7   Total Protein 6.0 - 8.5 g/dL 7.5  7.8    Total Bilirubin 0.0 - 1.2 mg/dL 0.9  0.5    Alkaline Phos 44 - 121 IU/L 132  138    AST 0 - 40 IU/L 33  33    ALT 0 - 44 IU/L 29  32     Lipid Panel     Component Value Date/Time   CHOL 153 12/05/2023 0858   TRIG 53 12/05/2023 0858   HDL 63 12/05/2023 0858   CHOLHDL 2.4 12/05/2023 0858   CHOLHDL 4.4 10/20/2018 0324   VLDL 18 10/20/2018 0324   LDLCALC 79 12/05/2023 0858    CBC    Component Value Date/Time   WBC 7.3 06/25/2022 1532   WBC 7.0 05/22/2019 1201   RBC 5.21 06/25/2022 1532   RBC 5.29 05/22/2019 1201   HGB 15.3 06/25/2022 1532   HCT 45.4 06/25/2022 1532  PLT 178 06/25/2022 1532   MCV 87 06/25/2022 1532   MCH 29.4 06/25/2022 1532   MCH 29.3 05/22/2019 1201   MCHC 33.7 06/25/2022 1532   MCHC 32.1 05/22/2019 1201   RDW 12.1 06/25/2022 1532   LYMPHSABS 2.9 08/23/2019 1152   MONOABS 1.3 (H) 10/24/2018 0515   EOSABS 0.3 08/23/2019 1152   BASOSABS 0.1 08/23/2019 1152    ASSESSMENT AND PLAN: 1. Type 2 diabetes mellitus with morbid obesity (HCC) (Primary) A1c improved now that he is back on the 2 mg of Ozempic .  He does not have any blood sugar readings to share at this time.  Will send prescription for glucometer with testing supplies.  When he does receive another refill on his continuous glucose monitor sensors, advised that he touch base with our clinical pharmacist to make sure that he is using/applying it correctly -Since he discontinued the insulin  2 months ago, we will leave him off of it for now. - glucose blood (ACCU-CHEK GUIDE  TEST) test strip; Use as instructed to check blood sugars 3 times a day with meals  Dispense: 100 each; Refill: 12 - Blood Glucose Monitoring Suppl (ACCU-CHEK GUIDE) w/Device KIT; Use as advised to check blood sugars  Dispense: 1 kit; Refill: 0 - Accu-Chek Softclix Lancets lancets; Use as instructed to check blood sugars 3 times a day  Dispense: 100 each; Refill: 12 - Microalbumin / creatinine urine ratio  2. Long-term (current) use of injectable non-insulin  antidiabetic drugs See #1 above  3. Hypertension associated with type 2 diabetes mellitus (HCC) At goal.  Continue amlodipine  10 mg daily, doxazosin  2 mg half a tablet daily, furosemide  20 mg daily and valsartan /HCTZ 80/12.5 mg once a day  4. Hyperlipidemia associated with type 2 diabetes mellitus (HCC) Continue atorvastatin  80 mg daily.  5. Stage 3b chronic kidney disease (HCC) Stable.  Will continue to monitor  6. Chronic diastolic HF (heart failure) (HCC) Stable.  Continue furosemide  20 mg daily  7. Chronic cough Symptoms sound to be more due to postnasal drip than to COPD.  Since he plans to pursue this illegally with the [place], we will get a PFT and refer him to pulmonary.  In the meantime however I recommend that he takes the Allegra  60 mg once a day.  Also recommend Flonase . - Pulmonary function test; Future - Ambulatory referral to Pulmonology  8. Postnasal drip See #7 above - fluticasone  (FLONASE ) 50 MCG/ACT nasal spray; Place 1 spray into both nostrils daily as needed for allergies.  Dispense: 48 mL; Refill: 1  9. Screening for colon cancer Patient advised to use the FIT kit and turn it in    Patient was given the opportunity to ask questions.  Patient verbalized understanding of the plan and was able to repeat key elements of the plan.   This documentation was completed using Paediatric nurse.  Any transcriptional errors are unintentional.  No orders of the defined types were placed in this  encounter.    Requested Prescriptions    No prescriptions requested or ordered in this encounter    No follow-ups on file.  Barnie Louder, MD, FACP

## 2023-12-29 NOTE — Patient Instructions (Signed)
 VISIT SUMMARY:  You came in today for a follow-up visit to manage your diabetes, hypertension, and hyperlipidemia. We discussed your recent health updates, including your improved blood sugar levels, current medications, and some respiratory symptoms you have been experiencing. We also reviewed your kidney function and upcoming preventive health measures.  YOUR PLAN:  -TYPE 2 DIABETES MELLITUS: Type 2 diabetes is a condition where your body does not use insulin  properly, leading to high blood sugar levels. Your A1c has improved to 7.3. You have stopped using Novolog  insulin  and resumed Ozempic  2 mg. We will send a prescription for an AccuCheck glucometer [facility] strips [facility]. Please contact clinical pharmacist Herlene for help with your [place] device when new sensors are available.  -HYPERTENSION: Hypertension is high blood pressure, which can lead to serious health problems if not managed. Your blood pressure today was 139/83 mmHg. Please continue taking your medications as prescribed: amlodipine , doxazosin , furosemide , and valsartan  hydrochlorothiazide .  -HYPERLIPIDEMIA: Hyperlipidemia is having high levels of fats (lipids) in your blood, which can increase your risk of heart disease. Your LDL cholesterol is currently 79 mg/dL, with a goal of 55 mg/dL. Continue taking atorvastatin  80 mg daily.  -CHRONIC KIDNEY DISEASE STAGE 3: Chronic kidney disease is a condition where your kidneys are damaged and can't filter blood as well as they should. Your kidney function is stable with a GFR in the 30s.  -CONGESTIVE HEART FAILURE: Congestive heart failure is a condition where your heart doesn't pump blood as well as it should. You reported shortness of breath but no chest pain.  -SUSPECTED COPD: COPD is a group of lung diseases that block airflow and make it difficult to breathe. You have a persistent cough, nasal congestion, and postnasal drip, possibly due to exposure to fumes during your [employer] We will order pulmonary function tests to confirm COPD and refer you to a pulmonologist if needed. We will also prescribe Flonase  nasal spray for your allergy symptoms and advise taking Allegra  every other day.  -GENERAL HEALTH MAINTENANCE: You are due for a colonoscopy in August 2025, ten years after your last one, which showed a benign rectal polyp. Please complete and submit the stool test kit you have at home.  INSTRUCTIONS:  Please pick up your prescription for the [facility] glucometer [facility] Contact clinical pharmacist Herlene for help with your [place] device when new sensors are available. Continue taking your medications as prescribed. Complete and submit the stool test kit you have at home. Follow up with a pulmonologist if your pulmonary function tests indicate COPD.

## 2023-12-30 DIAGNOSIS — E7521 Fabry (-Anderson) disease: Secondary | ICD-10-CM | POA: Diagnosis not present

## 2023-12-31 ENCOUNTER — Ambulatory Visit: Payer: Self-pay | Admitting: Internal Medicine

## 2023-12-31 LAB — MICROALBUMIN / CREATININE URINE RATIO
Creatinine, Urine: 82.7 mg/dL
Microalb/Creat Ratio: 618 mg/g{creat} — ABNORMAL HIGH (ref 0–29)
Microalbumin, Urine: 510.7 ug/mL

## 2024-01-03 ENCOUNTER — Other Ambulatory Visit: Payer: Self-pay

## 2024-01-04 ENCOUNTER — Other Ambulatory Visit: Payer: Self-pay | Admitting: Internal Medicine

## 2024-01-04 ENCOUNTER — Other Ambulatory Visit: Payer: Self-pay | Admitting: Pharmacist

## 2024-01-04 ENCOUNTER — Other Ambulatory Visit: Payer: Self-pay

## 2024-01-04 DIAGNOSIS — M109 Gout, unspecified: Secondary | ICD-10-CM

## 2024-01-04 DIAGNOSIS — E1169 Type 2 diabetes mellitus with other specified complication: Secondary | ICD-10-CM

## 2024-01-04 MED ORDER — SEMAGLUTIDE (2 MG/DOSE) 8 MG/3ML ~~LOC~~ SOPN
2.0000 mg | PEN_INJECTOR | SUBCUTANEOUS | 4 refills | Status: AC
  Filled 2024-01-10: qty 3, 28d supply, fill #0
  Filled 2024-02-07 – 2024-02-23 (×2): qty 3, 28d supply, fill #1

## 2024-01-06 ENCOUNTER — Other Ambulatory Visit: Payer: Self-pay

## 2024-01-09 ENCOUNTER — Other Ambulatory Visit: Payer: Self-pay

## 2024-01-10 ENCOUNTER — Other Ambulatory Visit: Payer: Self-pay

## 2024-01-13 DIAGNOSIS — E7521 Fabry (-Anderson) disease: Secondary | ICD-10-CM | POA: Diagnosis not present

## 2024-01-20 ENCOUNTER — Other Ambulatory Visit: Payer: Self-pay | Admitting: Internal Medicine

## 2024-01-20 DIAGNOSIS — I5032 Chronic diastolic (congestive) heart failure: Secondary | ICD-10-CM

## 2024-01-20 DIAGNOSIS — E1159 Type 2 diabetes mellitus with other circulatory complications: Secondary | ICD-10-CM

## 2024-01-31 DIAGNOSIS — E7521 Fabry (-Anderson) disease: Secondary | ICD-10-CM | POA: Diagnosis not present

## 2024-02-03 ENCOUNTER — Telehealth: Payer: Self-pay | Admitting: Internal Medicine

## 2024-02-03 NOTE — Telephone Encounter (Signed)
 Called patient to confirm upcoming appointment 02/07/2024. Patient appointment has been successfully confirmed

## 2024-02-05 ENCOUNTER — Encounter: Payer: Self-pay | Admitting: Internal Medicine

## 2024-02-07 ENCOUNTER — Ambulatory Visit: Attending: Family Medicine | Admitting: Pharmacist

## 2024-02-07 ENCOUNTER — Encounter: Payer: Self-pay | Admitting: Pharmacist

## 2024-02-07 ENCOUNTER — Other Ambulatory Visit: Payer: Self-pay

## 2024-02-07 DIAGNOSIS — E1169 Type 2 diabetes mellitus with other specified complication: Secondary | ICD-10-CM

## 2024-02-07 DIAGNOSIS — Z7985 Long-term (current) use of injectable non-insulin antidiabetic drugs: Secondary | ICD-10-CM

## 2024-02-07 NOTE — Progress Notes (Signed)
 Pharmacy TNM Diabetes Measure Review  S:  Patient was identified in a report as being at risk for failing the True [place] Metric of A1c control.   Patient was last seen and referred by his PCP on 12/29/2023. At that time, patient was taking Ozempic  2 mg weekly. He admitted to not taking insulin  for a couple of months prior to that appt. His A1c back in June was 7.3 (improved from 9 prior). He was continued on Ozempic  2 mg weekly only and told to return to see me for review of home sugars.   I saw him on 4/24, 5/27, and 12/05/2023. I assist in coordinating his refills and by reviewing his CGM data.  Today, pt reports doing well. Denies any NV, abdominal pain. He did not bring his receiver in today.  Current diabetes medications include: Ozempic  2 mg weekly Patient reports adherence to taking all medications as prescribed.   Insurance coverage: Aetna Medicare  Patient denies hypoglycemic events.  Reported home blood sugars:  Fasting: 120s  Afternoon: 130s-140s I have no GM or CGM to verify at this time. He is in the process of having CGM supplies approved by his insurance.   Patient reported dietary habits:  -Continues to limit juice and limits even zero calorie beverages.  -Has increased his water  intake -Does admit that he's a snacker. Tells me that sometimes at night, he'll get up around 9-10p and make a sandwich before going back to bed.   Patient-reported exercise habits: none reported  O:   Lab Results  Component Value Date   HGBA1C 7.3 (A) 12/05/2023   There were no vitals filed for this visit.  Lipid Panel     Component Value Date/Time   CHOL 153 12/05/2023 0858   TRIG 53 12/05/2023 0858   HDL 63 12/05/2023 0858   CHOLHDL 2.4 12/05/2023 0858   CHOLHDL 4.4 10/20/2018 0324   VLDL 18 10/20/2018 0324   LDLCALC 79 12/05/2023 0858    Clinical Atherosclerotic Cardiovascular Disease (ASCVD): Yes  The ASCVD Risk score (Arnett DK, et al., 2019) failed to calculate for  the following reasons:   Risk score cannot be calculated because patient has a medical history suggesting prior/existing ASCVD   Patient is participating in a Managed Medicaid Plan: No   A/P: Diabetes longstanding currently close to goal with his A1c of 7.3% in June. A1c is not due until the end of this month. He is asymptomatic from a hyper- or hypoglycemia standpoint. Patient is able to verbalize appropriate hypoglycemia management plan. Medication adherence appears to be appropriate and his reported home sugars are at goal. I will continue with Ozempic  2 mg weekly for DM control at this time. Will schedule with him next month to check an A1c. -Continued Ozempic  2 mg weekly.  -HOLD Novolog  70/30 for now.  -Patient educated on purpose, proper use, and potential adverse effects of Ozempic .  -Extensively discussed pathophysiology of diabetes, recommended lifestyle interventions, dietary effects on blood sugar control.  -Counseled on s/sx of and management of hypoglycemia.  -Next A1c anticipated 03/2024.   Follow-up:  Pharmacist in October. PCP clinic visit in 04/30/2024.  Herlene Fleeta Morris, PharmD, JAQUELINE, CPP Clinical Pharmacist [facility] [phone]

## 2024-02-15 ENCOUNTER — Other Ambulatory Visit: Payer: Self-pay

## 2024-02-17 DIAGNOSIS — E7521 Fabry (-Anderson) disease: Secondary | ICD-10-CM | POA: Diagnosis not present

## 2024-02-22 ENCOUNTER — Other Ambulatory Visit (HOSPITAL_BASED_OUTPATIENT_CLINIC_OR_DEPARTMENT_OTHER): Payer: Self-pay | Admitting: Pharmacist

## 2024-02-22 ENCOUNTER — Other Ambulatory Visit: Payer: Self-pay

## 2024-02-22 DIAGNOSIS — E119 Type 2 diabetes mellitus without complications: Secondary | ICD-10-CM

## 2024-02-22 DIAGNOSIS — Z7985 Long-term (current) use of injectable non-insulin antidiabetic drugs: Secondary | ICD-10-CM

## 2024-02-22 NOTE — Progress Notes (Signed)
 Pharmacy TNM Diabetes Measure Review  S:  Patient was identified in a report as being at risk for failing the True [place] Metric of A1c control.   Patient was last seen and referred by his PCP on 12/29/2023. At that time, patient was taking Ozempic  2 mg weekly. He admitted to not taking insulin  for a couple of months prior to that appt. His A1c back in June was 7.3 (improved from 9 prior). He was continued on Ozempic  2 mg weekly only and told to return to see me for review of home sugars. I then saw him in follow-up 02/07/2024 and continued on just the Ozempic .  Current diabetes medications include: Ozempic  2 mg weekly. Due for a refill.  Insurance coverage: Aetna Medicare  O:   Lab Results  Component Value Date   HGBA1C 7.3 (A) 12/05/2023   There were no vitals filed for this visit.  Lipid Panel     Component Value Date/Time   CHOL 153 12/05/2023 0858   TRIG 53 12/05/2023 0858   HDL 63 12/05/2023 0858   CHOLHDL 2.4 12/05/2023 0858   CHOLHDL 4.4 10/20/2018 0324   VLDL 18 10/20/2018 0324   LDLCALC 79 12/05/2023 0858    Clinical Atherosclerotic Cardiovascular Disease (ASCVD): Yes  The ASCVD Risk score (Arnett DK, et al., 2019) failed to calculate for the following reasons:   Risk score cannot be calculated because patient has a medical history suggesting prior/existing ASCVD   Patient is participating in a Managed Medicaid Plan: No   A/P: Diabetes longstanding currently close to goal with his A1c of 7.3% in June. A1c anticipated when I see him next month. Collaborated with our pharmacy to fill Ozempic .  -Continued Ozempic  2 mg weekly. Refill placed.  -Patient educated on purpose, proper use, and potential adverse effects of Ozempic .  -Extensively discussed pathophysiology of diabetes, recommended lifestyle interventions, dietary effects on blood sugar control.  -Counseled on s/sx of and management of hypoglycemia.  -Next A1c anticipated 03/2024.   Follow-up:  Pharmacist in  October. PCP clinic visit in 04/30/2024.  Herlene Fleeta Morris, PharmD, JAQUELINE, CPP Clinical Pharmacist [facility] [phone]

## 2024-02-23 ENCOUNTER — Other Ambulatory Visit: Payer: Self-pay

## 2024-02-29 ENCOUNTER — Other Ambulatory Visit (HOSPITAL_BASED_OUTPATIENT_CLINIC_OR_DEPARTMENT_OTHER): Payer: Self-pay | Admitting: Pharmacist

## 2024-02-29 DIAGNOSIS — Z7985 Long-term (current) use of injectable non-insulin antidiabetic drugs: Secondary | ICD-10-CM

## 2024-02-29 DIAGNOSIS — E119 Type 2 diabetes mellitus without complications: Secondary | ICD-10-CM

## 2024-02-29 NOTE — Progress Notes (Signed)
 Pharmacy TNM Diabetes Measure Review  S:  Patient was identified in a report as being at risk for failing the True [place] Metric of A1c control.   Patient was last seen and referred by his PCP on 12/29/2023. At that time, patient was taking Ozempic  2 mg weekly. He admitted to not taking insulin  for a couple of months prior to that appt. He was continued on Ozempic  2 mg weekly only and told to return to see me for review of home sugars. I then saw him in follow-up 02/07/2024 and continued on just the Ozempic . Last week, placed Ozempic  refills on 02/22/2024.   Today, I reviewed his dispense record. Patient filled Ozempic  2mg  on 02/23/2024. I see him on follow-up 03/26/2024.   Current diabetes medications include: Ozempic  2 mg weekly.  Insurance coverage: Aetna Medicare  O:   Lab Results  Component Value Date   HGBA1C 7.3 (A) 12/05/2023   There were no vitals filed for this visit.  Lipid Panel     Component Value Date/Time   CHOL 153 12/05/2023 0858   TRIG 53 12/05/2023 0858   HDL 63 12/05/2023 0858   CHOLHDL 2.4 12/05/2023 0858   CHOLHDL 4.4 10/20/2018 0324   VLDL 18 10/20/2018 0324   LDLCALC 79 12/05/2023 0858    Clinical Atherosclerotic Cardiovascular Disease (ASCVD): Yes  The ASCVD Risk score (Arnett DK, et al., 2019) failed to calculate for the following reasons:   Risk score cannot be calculated because patient has a medical history suggesting prior/existing ASCVD   Patient is participating in a Managed Medicaid Plan: No   A/P: Diabetes longstanding currently close to goal with his A1c of 7.3% in June. A1c anticipated when I see him next month. Collaborated with our pharmacy to fill Ozempic  last week. He filled this and is due for refills again on the week I see him next month. No action required at this time.  -Continued Ozempic  2 mg weekly. Refill placed.  -Patient educated on purpose, proper use, and potential adverse effects of Ozempic .  -Extensively discussed  pathophysiology of diabetes, recommended lifestyle interventions, dietary effects on blood sugar control.  -Counseled on s/sx of and management of hypoglycemia.  -Next A1c anticipated 03/2024.   Follow-up:  Pharmacist: 03/26/2024. PCP clinic visit in 04/30/2024.  Herlene Fleeta Morris, PharmD, JAQUELINE, CPP Clinical Pharmacist [facility] [phone]

## 2024-03-02 DIAGNOSIS — E7521 Fabry (-Anderson) disease: Secondary | ICD-10-CM | POA: Diagnosis not present

## 2024-03-14 ENCOUNTER — Ambulatory Visit: Admitting: Pulmonary Disease

## 2024-03-15 DIAGNOSIS — E118 Type 2 diabetes mellitus with unspecified complications: Secondary | ICD-10-CM | POA: Diagnosis not present

## 2024-03-15 DIAGNOSIS — I129 Hypertensive chronic kidney disease with stage 1 through stage 4 chronic kidney disease, or unspecified chronic kidney disease: Secondary | ICD-10-CM | POA: Diagnosis not present

## 2024-03-15 DIAGNOSIS — N1831 Chronic kidney disease, stage 3a: Secondary | ICD-10-CM | POA: Diagnosis not present

## 2024-03-15 DIAGNOSIS — I441 Atrioventricular block, second degree: Secondary | ICD-10-CM | POA: Diagnosis not present

## 2024-03-15 DIAGNOSIS — M109 Gout, unspecified: Secondary | ICD-10-CM | POA: Diagnosis not present

## 2024-03-15 DIAGNOSIS — I639 Cerebral infarction, unspecified: Secondary | ICD-10-CM | POA: Diagnosis not present

## 2024-03-15 DIAGNOSIS — E7521 Fabry (-Anderson) disease: Secondary | ICD-10-CM | POA: Diagnosis not present

## 2024-03-15 DIAGNOSIS — I890 Lymphedema, not elsewhere classified: Secondary | ICD-10-CM | POA: Diagnosis not present

## 2024-03-15 NOTE — Progress Notes (Signed)
 Remote PPM Transmission

## 2024-03-16 DIAGNOSIS — E7521 Fabry (-Anderson) disease: Secondary | ICD-10-CM | POA: Diagnosis not present

## 2024-03-19 ENCOUNTER — Ambulatory Visit (INDEPENDENT_AMBULATORY_CARE_PROVIDER_SITE_OTHER): Payer: Medicare HMO

## 2024-03-19 DIAGNOSIS — I441 Atrioventricular block, second degree: Secondary | ICD-10-CM

## 2024-03-20 LAB — CUP PACEART REMOTE DEVICE CHECK
Battery Remaining Longevity: 29 mo
Battery Remaining Percentage: 33 %
Battery Voltage: 2.95 V
Brady Statistic AP VP Percent: 80 %
Brady Statistic AP VS Percent: 1 %
Brady Statistic AS VP Percent: 19 %
Brady Statistic AS VS Percent: 1 %
Brady Statistic RA Percent Paced: 80 %
Brady Statistic RV Percent Paced: 99 %
Date Time Interrogation Session: 20251013190631
Implantable Lead Connection Status: 753985
Implantable Lead Connection Status: 753985
Implantable Lead Implant Date: 20120907
Implantable Lead Implant Date: 20120907
Implantable Lead Location: 753859
Implantable Lead Location: 753860
Implantable Pulse Generator Implant Date: 20201215
Lead Channel Impedance Value: 300 Ohm
Lead Channel Impedance Value: 380 Ohm
Lead Channel Pacing Threshold Amplitude: 0.625 V
Lead Channel Pacing Threshold Amplitude: 1.875 V
Lead Channel Pacing Threshold Pulse Width: 0.5 ms
Lead Channel Pacing Threshold Pulse Width: 0.8 ms
Lead Channel Sensing Intrinsic Amplitude: 5 mV
Lead Channel Sensing Intrinsic Amplitude: 9.1 mV
Lead Channel Setting Pacing Amplitude: 1.625
Lead Channel Setting Pacing Amplitude: 2.125
Lead Channel Setting Pacing Pulse Width: 0.8 ms
Lead Channel Setting Sensing Sensitivity: 4 mV
Pulse Gen Model: 2272
Pulse Gen Serial Number: 9187005

## 2024-03-21 NOTE — Progress Notes (Signed)
 Remote PPM Transmission

## 2024-03-26 ENCOUNTER — Encounter: Payer: Self-pay | Admitting: Pharmacist

## 2024-03-26 ENCOUNTER — Ambulatory Visit: Attending: Family Medicine | Admitting: Pharmacist

## 2024-03-26 DIAGNOSIS — E1169 Type 2 diabetes mellitus with other specified complication: Secondary | ICD-10-CM

## 2024-03-26 DIAGNOSIS — E119 Type 2 diabetes mellitus without complications: Secondary | ICD-10-CM | POA: Diagnosis not present

## 2024-03-26 DIAGNOSIS — Z7985 Long-term (current) use of injectable non-insulin antidiabetic drugs: Secondary | ICD-10-CM

## 2024-03-26 LAB — POCT GLYCOSYLATED HEMOGLOBIN (HGB A1C): HbA1c, POC (controlled diabetic range): 7.3 % — AB (ref 0.0–7.0)

## 2024-03-26 NOTE — Progress Notes (Signed)
 Pharmacy TNM Diabetes Measure Review  S:  Patient was identified in a report as being at risk for failing the True [place] Metric of A1c control.   Patient was last seen and referred by his PCP on 12/29/2023. At that time, patient was taking Ozempic  2 mg weekly. He admitted to not taking insulin  for a couple of months prior to that appt. His A1c back in June was 7.3 (improved from 9 prior). He was continued on Ozempic  2 mg weekly only and told to return to see me for review of home sugars.   I saw him since that visit on 02/07/24. He was doing well on Ozempic  only so I continued this.   Today, pt reports doing well. Denies any NV, abdominal pain. He did not bring his receiver in today. A1c is 7.3%. Of note, he saw [facility] (Dr. Nathen). He was noted to have gained 8 lbs since his visit in Feb of this year. Today, Mr. Wadlow tells me that his kidney specialist recommends to switch to Mounjaro for better glycemic control and additional weight loss.   Current diabetes medications include: Ozempic  2 mg weekly Patient reports adherence to taking all medications as prescribed.   Insurance coverage: Aetna Medicare  Patient denies hypoglycemic events.  Reported home blood sugars:  Fasting: 120s  Afternoon: 130s-140s I have no GM or CGM to verify at this time. He is in the process of having CGM supplies approved by his insurance.   Patient reported dietary habits:  -Continues to limit juice and limits even zero calorie beverages.  -Has increased his water  intake -Does admit that he's a snacker. Tells me that sometimes at night, he'll get up around 9-10p and make a sandwich before going back to bed.   Patient-reported exercise habits: none reported  O:   Lab Results  Component Value Date   HGBA1C 7.3 (A) 03/26/2024   There were no vitals filed for this visit.  Lipid Panel     Component Value Date/Time   CHOL 153 12/05/2023 0858   TRIG 53 12/05/2023 0858   HDL 63 12/05/2023  0858   CHOLHDL 2.4 12/05/2023 0858   CHOLHDL 4.4 10/20/2018 0324   VLDL 18 10/20/2018 0324   LDLCALC 79 12/05/2023 0858    Clinical Atherosclerotic Cardiovascular Disease (ASCVD): Yes  The ASCVD Risk score (Arnett DK, et al., 2019) failed to calculate for the following reasons:   Risk score cannot be calculated because patient has a medical history suggesting prior/existing ASCVD   Patient is participating in a Managed Medicaid Plan: No   A/P: Diabetes longstanding currently above goal with his A1c of 7.3% today. He is asymptomatic from a hyper- or hypoglycemia standpoint. Patient is able to verbalize appropriate hypoglycemia management plan. Medication adherence appears to be appropriate and his reported home sugars are at goal. His Medicare copays are cost-prohibitive, and we enrolled him in Ozempic  MAP this year because of this. He has free Ozempic  until the end of this year.   I agree with his Nephrologist. Mounjaro gives us  betterA1c lowering and weight reduction vs the Ozempic . However, there are no current MAP programs for the Mounjaro. Therefore, pt will need assistance via Medicaid or Medicare Extra Help. I discussed this with him. He will go upstairs to our Medicaid office to see if he can apply. He already has [employer]. I'm hopeful he will be approved for Medicaid. If he is denied, we can apply for Medicare Extra Help. This can take some  time, and I prefer for him to stay on Ozempic  now to avoid running out of drug therapy while we have him apply. Should he be approved for Medicaid or Medicare Extra Help, I will switch to Mounjaro.  -Continued Ozempic  2 mg weekly for now.  -Patient educated on purpose, proper use, and potential adverse effects of Ozempic .  -Extensively discussed pathophysiology of diabetes, recommended lifestyle interventions, dietary effects on blood sugar control.  -Counseled on s/sx of and management of hypoglycemia.  -Next A1c anticipated 06/2024.    Follow-up:  Pharmacist in 6-8 weeks. PCP clinic visit in 04/30/2024.  Herlene Fleeta Morris, PharmD, JAQUELINE, CPP Clinical Pharmacist [facility] & [facility] [phone]

## 2024-03-28 ENCOUNTER — Telehealth: Payer: Self-pay | Admitting: Pharmacist

## 2024-03-28 NOTE — Telephone Encounter (Signed)
 I saw patient earlier this week for diabetes management. His medication adherence is affected by cost barriers. This year we were able to enroll him in MAP for Ozempic  assistance, however, this program is ending assistance for Medicare beneficiaries at the end of this year. When I saw him, we discussed other options such as Medicare Extra Help and applying for Medicaid. After his appt with me 03/26/24, I directed him upstairs to our Medicaid office ([address] here at [facility]).   I called him today to see if he was able to inquire concerning applying for Medicaid. He was unavailable so I left a HIPAA-compliant VM with instructions to return my call.   MyChart message sent.   Herlene Fleeta Morris, PharmD, JAQUELINE, CPP Clinical Pharmacist [facility] [phone]

## 2024-03-29 ENCOUNTER — Encounter: Payer: Self-pay | Admitting: Internal Medicine

## 2024-03-30 ENCOUNTER — Encounter: Payer: Self-pay | Admitting: Pharmacist

## 2024-03-30 DIAGNOSIS — E7521 Fabry (-Anderson) disease: Secondary | ICD-10-CM | POA: Diagnosis not present

## 2024-04-04 ENCOUNTER — Ambulatory Visit: Payer: Self-pay | Admitting: Cardiology

## 2024-04-05 ENCOUNTER — Other Ambulatory Visit: Payer: Self-pay | Admitting: Internal Medicine

## 2024-04-05 DIAGNOSIS — I152 Hypertension secondary to endocrine disorders: Secondary | ICD-10-CM

## 2024-04-11 ENCOUNTER — Other Ambulatory Visit: Payer: Self-pay

## 2024-04-13 DIAGNOSIS — E7521 Fabry (-Anderson) disease: Secondary | ICD-10-CM | POA: Diagnosis not present

## 2024-04-17 ENCOUNTER — Ambulatory Visit: Admitting: Pulmonary Disease

## 2024-04-17 ENCOUNTER — Encounter: Payer: Self-pay | Admitting: Pulmonary Disease

## 2024-04-17 VITALS — BP 147/82 | HR 86 | Wt 280.0 lb

## 2024-04-17 DIAGNOSIS — J329 Chronic sinusitis, unspecified: Secondary | ICD-10-CM

## 2024-04-17 DIAGNOSIS — R053 Chronic cough: Secondary | ICD-10-CM | POA: Diagnosis not present

## 2024-04-17 DIAGNOSIS — J4489 Other specified chronic obstructive pulmonary disease: Secondary | ICD-10-CM

## 2024-04-17 DIAGNOSIS — J4540 Moderate persistent asthma, uncomplicated: Secondary | ICD-10-CM

## 2024-04-17 NOTE — Patient Instructions (Signed)
 Nice to meet you  Use Flonase  1 spray each nostril twice a day for the next 7 days.  Then decrease to 1 spray each nostril once a day.  Use it every day.  If the cough is no better, we need to consider using inhalers in the future.  Return to clinic in 3 months or sooner if needed with Dr. Annella

## 2024-04-19 NOTE — Progress Notes (Unsigned)
 @Patient  ID: [name], male    DOB: [birth year], 63 y.o.   MRN: [id number]  Chief Complaint  Patient presents with  . Consult    Pt states Dx of COPD    Referring provider: Vicci Barnie NOVAK, MD  HPI:   PMH:  Smoker/ Smoking History:  Maintenance:   Pt of:   04/19/2024  - Visit     Questionaires / Pulmonary Flowsheets:   ACT:      No data to display          MMRC:     No data to display          Epworth:      No data to display          Tests:   FENO:  No results found for: NITRICOXIDE  PFT:     No data to display          WALK:     07/14/2012    9:43 PM 07/04/2012   10:11 AM  SIX MIN WALK  Medications  meds took at 7:20 am: eplerenone  50 mg<> cozaar  25mg <> glyburide  5mg >< allopurinol  100 mg<> colchicine  0.6mg ><  Supplimental Oxygen during Test? (L/min)  No  Laps  6   Partial Lap (in Meters)  0 meters  Baseline BP (sitting)  110/72  Baseline Heartrate  68  Baseline Dyspnea (Borg Scale)  2  Baseline Fatigue (Borg Scale)  2  Baseline SPO2  98 %  BP (sitting)  180/90  Heartrate  105  Dyspnea (Borg Scale)  5  Fatigue (Borg Scale)  4  SPO2  99 %  BP (sitting)  120/82  Heartrate  74  SPO2  98 %  Stopped or Paused before Six Minutes  Yes  Other Symptoms at end of Exercise  pt complained of chest tightness,sob,and hip pain.   Distance Completed  288 meters   Tech Comments:  pt unable to complete walk stopped with only 1.4 minutes left.   Provider Comments: 98%, 99%, 98% had to stop at 4.6 minutes with c/o chest tight, sob, hip pain. Not an oxygenation limit.      Data saved with a previous flowsheet row definition    Imaging: No results found.  Lab Results:  CBC    Component Value Date/Time   WBC 7.3 06/25/2022 1532   WBC 7.0 05/22/2019 1201   RBC 5.21 06/25/2022 1532   RBC 5.29 05/22/2019 1201   HGB 15.3 06/25/2022 1532   HCT 45.4 06/25/2022 1532   PLT 178 06/25/2022 1532   MCV 87 06/25/2022 1532   MCH 29.4  06/25/2022 1532   MCH 29.3 05/22/2019 1201   MCHC 33.7 06/25/2022 1532   MCHC 32.1 05/22/2019 1201   RDW 12.1 06/25/2022 1532   LYMPHSABS 2.9 08/23/2019 1152   MONOABS 1.3 (H) 10/24/2018 0515   EOSABS 0.3 08/23/2019 1152   BASOSABS 0.1 08/23/2019 1152    BMET    Component Value Date/Time   NA 140 06/25/2022 1532   K 4.5 06/25/2022 1532   CL 100 06/25/2022 1532   CO2 22 06/25/2022 1532   GLUCOSE 410 (H) 06/25/2022 1532   GLUCOSE 133 (H) 05/22/2019 1321   BUN 30 (H) 06/25/2022 1532   CREATININE 2.26 (H) 06/25/2022 1532   CREATININE 1.28 11/07/2014 0910   CALCIUM  9.6 06/25/2022 1532   GFRNONAA 36 (L) 07/08/2020 1048   GFRNONAA 63 11/07/2014 0910   GFRAA 42 (L) 07/08/2020 1048   GFRAA 73 11/07/2014 0910  BNP No results found for: BNP  ProBNP    Component Value Date/Time   PROBNP 721.0 (H) 03/13/2013 1245    Specialty Problems       Pulmonary Problems   Obstructive sleep apnea, suspected   NPSG 08/01/12- AHI 1.7/ hr, within normal limits, weight 270 lbs.  We discussed sleep apnea and normal range events. He is encouraged to keep his weight down.       Upper airway cough syndrome   Reactive airway disease   Chronic sinusitis    Allergies  Allergen Reactions  . Lisinopril Anaphylaxis  . Shellfish Allergy Hives and Swelling    Immunization History  Administered Date(s) Administered  . Influenza Split 08/21/2011, 07/11/2012  . Influenza, Seasonal, Injecte, Preservative Fre 04/29/2023  . Influenza,inj,Quad PF,6+ Mos 04/21/2015, 05/12/2017, 05/29/2018, 05/23/2019, 07/08/2020, 02/23/2021  . PFIZER(Purple Top)SARS-COV-2 Vaccination 09/13/2019, 10/08/2019, 07/11/2020  . PNEUMOCOCCAL CONJUGATE-20 02/23/2021  . Pneumococcal Polysaccharide-23 08/21/2011, 05/23/2019  . Tdap 12/27/2022  . Zoster Recombinant(Shingrix ) 10/16/2020, 08/29/2023    Past Medical History:  Diagnosis Date  . Allergy [id number]   Dont know. Isnt it O/F  . Bell's palsy   . Bifascicular  block   . Cataract 11/05/2021  . Chronic bronchitis (HCC)    seasonal; get it q yr  . Chronic diastolic CHF (congestive heart failure) (HCC) 2010  . Chronic kidney disease (CKD), stage II (mild)    stage II to III/notes 07/23/2014  . Diastolic heart failure secondary to hypertrophic cardiomyopathy (HCC) CARDIOLOGIST-- DR VICTORY SHARPS  . Dyspnea    increased exertion   . Edema 07/2013  . 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 Gout, arthritis 2014   bil feet. right worse  . History of cellulitis of skin with lymphangitis LEFT LEG  . HOCM (hypertrophic obstructive cardiomyopathy) (HCC)    a. Echo 11/16: Severe LVH, EF 55-60%, abnormal GLS consistent with HOCM, no SAM, mild LAE  . Hypertension 20 years  . Lymphedema of lower extremity LEFT  >  RIGHT   using ankle high socks at home; pump doesn't work for me (07/23/2014)  . Pacemaker    02/12/11  . Pneumonia 08/2011  . Seasonal asthma NO INHALERS   30 years  . Second degree Mobitz II AV block    with syncope, s/p PPM  . Short of breath on exertion   . Stroke (HCC) 10/23/2017  . Type II diabetes mellitus (HCC) 2012   INSULIN  DEPENDENT    Tobacco History: Social History   Tobacco Use  Smoking Status Never  Smokeless Tobacco Never  Tobacco Comments   Never smoked. Tobacco or cannabis   Counseling given: Not Answered Tobacco comments: Never smoked. Tobacco or cannabis   Continue to not smoke  Outpatient Encounter Medications as of 04/17/2024  Medication Sig  . Accu-Chek Softclix Lancets lancets Use as instructed to check blood sugars 3 times a day  . acetaminophen  (TYLENOL ) 325 MG tablet Take 1-2 tablets (325-650 mg total) by mouth every 4 (four) hours as needed for mild pain.  . albuterol  (PROAIR  HFA) 108 (90 Base) MCG/ACT inhaler Inhale 1 puff into the lungs every 6 (six) hours as needed for wheezing or shortness of breath.  . allopurinol  (ZYLOPRIM ) 100 MG tablet TAKE 3 TABLETS BY  MOUTH EVERY DAY  . amLODipine  (NORVASC ) 10 MG tablet TAKE 1 TABLET BY MOUTH EVERY DAY  . atorvastatin  (LIPITOR ) 80 MG tablet TAKE 1 TABLET BY MOUTH DAILY AT 6 PM.  . Blood  Glucose Monitoring Suppl (ACCU-CHEK GUIDE) w/Device KIT Use as advised to check blood sugars  . clopidogrel  (PLAVIX ) 75 MG tablet TAKE 1 TABLET BY MOUTH EVERY DAY  . Continuous Glucose Receiver (FREESTYLE LIBRE 3 READER) DEVI USE TO CHECK BLOOD SUGAR CONTINUOUSLY. E11.59  . Continuous Glucose Sensor (FREESTYLE LIBRE 3 PLUS SENSOR) MISC Use to check blood sugar continuously. Change sensor once every 15 days. E11.59  . doxazosin  (CARDURA ) 2 MG tablet TAKE 1/2 TABLET BY MOUTH DAILY  . fexofenadine  (ALLEGRA ) 180 MG tablet Take 180 mg by mouth every 14 (fourteen) days.  . fluticasone  (FLONASE ) 50 MCG/ACT nasal spray Place 1 spray into both nostrils daily as needed for allergies.  . furosemide  (LASIX ) 20 MG tablet TAKE 1 TABLET BY MOUTH EVERY DAY  . glucose blood (ACCU-CHEK GUIDE TEST) test strip Use as instructed to check blood sugars 3 times a day with meals  . Insulin  Pen Needle (PX SHORTLENGTH PEN NEEDLES) 31G X 8 MM MISC 1 each by Does not apply route 2 (two) times daily before a meal.  . Semaglutide , 2 MG/DOSE, 8 MG/3ML SOPN Inject 2 mg as directed once a week.  . valsartan -hydrochlorothiazide  (DIOVAN -HCT) 80-12.5 MG tablet TAKE 1 TABLET BY MOUTH EVERY DAY  . Agalsidase beta  (FABRAZYME  IV) Inject 115 mg into the vein every 14 (fourteen) days.  (Patient not taking: Reported on 04/17/2024)  . lidocaine -prilocaine  (EMLA ) cream APPLY THIN LAYER TO AFFECTED AREA OF SKIN DAILY AS NEEDED (Patient not taking: Reported on 04/17/2024)   No facility-administered encounter medications on file as of 04/17/2024.     Review of Systems  Review of Systems   Physical Exam  BP (!) 147/82   Pulse 86   Wt 280 lb (127 kg)   SpO2 96%   BMI 40.18 kg/m   Wt Readings from Last 5 Encounters:  04/17/24 280 lb (127 kg)  02/07/24 264 lb 6.4  oz (119.9 kg)  12/29/23 263 lb (119.3 kg)  11/17/23 263 lb (119.3 kg)  10/04/23 263 lb (119.3 kg)    BMI Readings from Last 5 Encounters:  04/17/24 40.18 kg/m  02/07/24 37.94 kg/m  12/29/23 37.74 kg/m  11/17/23 37.74 kg/m  10/04/23 37.74 kg/m     Physical Exam    Assessment & Plan:   Severe COPD/asthma overlap: Based on PFTs in 2014 severe reduction in FEV1, with significant bronchodilator response.  N/A never smoker this is likely sequela of severe asthma.  He has no significant dyspnea per his report.  He does have some cough addressing below.  Consider addition of ICS LAMA therapy in the future if cough or dyspnea or to continue to be a problem.  Chronic cough: Likely multifactorial, do worry about cough variant asthma.  As well as significant postnasal drip symptoms, chronic rhinorrhea.   Return in about 3 months (around 07/18/2024) for f/u Dr. Annella.   [name] JONELLE Annella, MD 04/19/2024   This appointment required *** minutes of patient care (this includes precharting, chart review, review of results, face-to-face care, etc.).

## 2024-04-23 ENCOUNTER — Other Ambulatory Visit: Payer: Self-pay

## 2024-04-27 DIAGNOSIS — E7521 Fabry (-Anderson) disease: Secondary | ICD-10-CM | POA: Diagnosis not present

## 2024-04-30 ENCOUNTER — Encounter: Payer: Self-pay | Admitting: Internal Medicine

## 2024-04-30 ENCOUNTER — Ambulatory Visit: Attending: Internal Medicine | Admitting: Internal Medicine

## 2024-04-30 DIAGNOSIS — E1159 Type 2 diabetes mellitus with other circulatory complications: Secondary | ICD-10-CM | POA: Diagnosis not present

## 2024-04-30 DIAGNOSIS — E1169 Type 2 diabetes mellitus with other specified complication: Secondary | ICD-10-CM

## 2024-04-30 DIAGNOSIS — Z7985 Long-term (current) use of injectable non-insulin antidiabetic drugs: Secondary | ICD-10-CM

## 2024-04-30 DIAGNOSIS — I6930 Unspecified sequelae of cerebral infarction: Secondary | ICD-10-CM

## 2024-04-30 DIAGNOSIS — Z23 Encounter for immunization: Secondary | ICD-10-CM | POA: Diagnosis not present

## 2024-04-30 DIAGNOSIS — Z6839 Body mass index (BMI) 39.0-39.9, adult: Secondary | ICD-10-CM | POA: Diagnosis not present

## 2024-04-30 DIAGNOSIS — E7521 Fabry (-Anderson) disease: Secondary | ICD-10-CM

## 2024-04-30 DIAGNOSIS — I152 Hypertension secondary to endocrine disorders: Secondary | ICD-10-CM

## 2024-04-30 DIAGNOSIS — N1832 Chronic kidney disease, stage 3b: Secondary | ICD-10-CM | POA: Diagnosis not present

## 2024-04-30 DIAGNOSIS — Z1211 Encounter for screening for malignant neoplasm of colon: Secondary | ICD-10-CM

## 2024-04-30 DIAGNOSIS — H269 Unspecified cataract: Secondary | ICD-10-CM | POA: Diagnosis not present

## 2024-04-30 NOTE — Patient Instructions (Signed)
  VISIT SUMMARY: Today, you came in for a follow-up on your diabetes management. We discussed your recent HbA1c levels, weight changes, and issues with your blood glucose monitoring. We also reviewed your chronic kidney disease, Fabry disease, history of stroke, and cataract surgery plans. Additionally, we talked about general health maintenance, including upcoming screenings and vaccinations.  YOUR PLAN: -TYPE 2 DIABETES MELLITUS: Type 2 diabetes is a condition where your body does not use insulin  properly, leading to high blood sugar levels. Your HbA1c is currently 7.3%, and our target is below 7%. Continue taking Ozempic  2 mg. To address the signal loss with your [place] sensor, keep the reader close and in your pocket. Bring your [place] reader and manual readings to your next appointment with [facility]. Continue to moderate your diet by avoiding sugary foods and monitor your weight, aiming for weight loss.  -CHRONIC KIDNEY DISEASE, STAGE 3B: Chronic kidney disease is a condition where your kidneys are damaged and can't filter blood as well as they should. Your kidney function is stable with an eGFR of 36. It's important to manage your blood pressure, so continue your current medications and monitor your blood pressure at home weekly. Keep limiting your salt intake.  -FABRY DISEASE: Fabry disease is a genetic disorder that affects many parts of your body. You are managing it with Fodzyme infusions every two weeks. Continue with these infusions as scheduled.  -SEQUELAE OF CEREBRAL INFARCTION (STROKE): This refers to the after-effects of a stroke. You are managing this with atorvastatin  and Plavix . Continue taking these medications as prescribed.  -CATARACT: A cataract is a clouding of the lens in your eye, which affects your vision. Surgery is pending due to the need for post-operative support. We have requested records from Dr. Leonardo and consulted with Slater, the case worker, for assistance with  post-operative support.  -GENERAL HEALTH MAINTENANCE: You are due for a colonoscopy and a diabetic eye exam. We have submitted referrals for both. Additionally, we recommend getting a COVID booster at an outside pharmacy or our in-office pharmacy.  INSTRUCTIONS: Please bring your [place] reader and manual blood sugar readings to your next appointment with [facility]. Monitor your blood pressure at home weekly and keep a record of the readings. Follow up with the referrals for your colonoscopy and diabetic eye exam. Consider getting your COVID booster at a pharmacy or during your next visit.                      Contains text generated by [employer]bridge.                                 Contains text generated by [employer]bridge.

## 2024-04-30 NOTE — Progress Notes (Signed)
 [name], male    DOB: 08-02-1960  MRN: [id number]  CC: Hypertension (HTN & DM f/u. Thompson cataract sx /Flu vax administered on 04/30/24 - C.A.)   Subjective: [name] is a 63 y.o. male who presents for chronic ds management. His concerns today include:  Patient with history of diabetes type 2 with microalbuminuria, diastolic CHF with pacemaker (secconary to AV block with syncopy), CKD stage III, HOCM, HTN, HL, CVA, lymphedema. 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    Discussed the use of AI scribe software for clinical note transcription with the patient, who gave verbal consent to proceed.  History of Present Illness   [name] is a 63 year old male with diabetes and chronic kidney disease who presents for follow-up of his diabetes management.  His last HbA1c was 7.3% one mth ago. He is on Ozempic  2 mg and has not needed insulin  recently, though he keeps it at home as a just in case. He experiences issues with his Herlene sensor losing signal after 2-3 days. He manually checks his blood sugar, with morning readings around 117 mg/dL and slight increases by midday. Forgot to bring his log with him today. -His weight was 263 lbs in July, increased to 280 lbs in November, and is now 272 lbs. He is managing his diet by avoiding sugary foods and using moderation.  HTN: His blood pressure was 137/80 today on 1st check. He does not regularly check his blood pressure at home but believes he has a device. He is limiting salt intake, using Mrs. Dash seasoning instead. He took his blood pressure medications already for the morning which include Lasix  20 mg daily, Diovan /HCTZ 80/12.5 mg daily, amlodipine  10 mg daily and doxazosin  2 mg daily  CKD: He saw his kidney specialist on October 9, where his blood pressure was 140/80. His kidney function remains stable with a filtration rate in the 30s with last one being 36. No changes were made to  his medications.  He continues to receive Fabrazyme  infusions every two weeks for his Fabry's disease.   Hx CVA:  He is taking atorvastatin  and Plavix  for his history of stroke and confirms taking all his medications, including blood pressure medications, this morning.   He sees Dr. Octavia for his eye care.  He needs to have cataract surgery but was told he would need someone to bring him on the day of the surgery, stay with him and then also be with him when he returns home for several hours.  He does not have any friends or family in the area to go with him.    HM: He received a flu shot recently and is due for a colonoscopy.       Patient Active Problem List   Diagnosis Date Noted   Tetanus, diphtheria, and acellular pertussis (Tdap) vaccination declined 08/24/2022   Post-nasal drip 10/26/2021   Chronic sinusitis 07/01/2021   Contact dermatitis and other eczema 07/01/2021   Edema 07/01/2021   Other and unspecified hyperlipidemia 07/01/2021   Proteinuria 07/01/2021   Macroalbuminuric diabetic nephropathy (HCC) 11/08/2020   Diabetes mellitus (HCC) 11/07/2020   Hypertension associated with stage 3a chronic kidney disease due to type 2 diabetes mellitus (HCC) 11/07/2020   Influenza vaccine needed 07/08/2020   Spinal stenosis of lumbar region without neurogenic claudication 07/08/2020   Degenerative spondylolisthesis 05/20/2020   Acute left-sided low back pain with left-sided sciatica 05/08/2020   Reactive  airway disease 01/24/2020   Pain due to onychomycosis of toenails of both feet 10/17/2019   Morbid obesity (HCC) 08/23/2019   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   CVA (cerebral vascular accident) (HCC) 10/20/2018   UTI (urinary tract infection) 10/19/2018   Upper airway cough syndrome 08/01/2018   Lymphedema 10/06/2016   Gout, arthritis    Anorectal fistula 06/23/2015   Onychomycosis of toenail 09/26/2014   Type 2 diabetes mellitus with obesity    Iron deficiency anemia  07/29/2014   CKD (chronic kidney disease), stage III (HCC)    Chronic diastolic HF (heart failure) (HCC) 03/27/2013   Peripheral edema 07/20/2012   Obstructive sleep apnea, suspected 06/21/2012   Pacemaker-St.Jude 02/23/2012   Hypertension 08/09/2011   Mobitz type II atrioventricular block 05/19/2011   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HCC) 05/19/2011     Current Outpatient Medications on File Prior to Visit  Medication Sig Dispense Refill   Accu-Chek Softclix Lancets lancets Use as instructed to check blood sugars 3 times a day 100 each 12   acetaminophen  (TYLENOL ) 325 MG tablet Take 1-2 tablets (325-650 mg total) by mouth every 4 (four) hours as needed for mild pain.     albuterol  (PROAIR  HFA) 108 (90 Base) MCG/ACT inhaler Inhale 1 puff into the lungs every 6 (six) hours as needed for wheezing or shortness of breath. 8.5 each 1   allopurinol  (ZYLOPRIM ) 100 MG tablet TAKE 3 TABLETS BY MOUTH EVERY DAY 270 tablet 1   amLODipine  (NORVASC ) 10 MG tablet TAKE 1 TABLET BY MOUTH EVERY DAY 90 tablet 1   atorvastatin  (LIPITOR ) 80 MG tablet TAKE 1 TABLET BY MOUTH DAILY AT 6 PM. 90 tablet 2   Blood Glucose Monitoring Suppl (ACCU-CHEK GUIDE) w/Device KIT Use as advised to check blood sugars 1 kit 0   clopidogrel  (PLAVIX ) 75 MG tablet TAKE 1 TABLET BY MOUTH EVERY DAY 90 tablet 2   Continuous Glucose Receiver (FREESTYLE LIBRE 3 READER) DEVI USE TO CHECK BLOOD SUGAR CONTINUOUSLY. E11.59 1 each 0   Continuous Glucose Sensor (FREESTYLE LIBRE 3 PLUS SENSOR) MISC Use to check blood sugar continuously. Change sensor once every 15 days. E11.59 2 each 6   doxazosin  (CARDURA ) 2 MG tablet TAKE 1/2 TABLET BY MOUTH DAILY 45 tablet 1   fexofenadine  (ALLEGRA ) 180 MG tablet Take 180 mg by mouth every 14 (fourteen) days.     fluticasone  (FLONASE ) 50 MCG/ACT nasal spray Place 1 spray into both nostrils daily as needed for allergies. 48 mL 1   furosemide  (LASIX ) 20 MG tablet TAKE 1 TABLET BY MOUTH EVERY DAY 90 tablet 1   glucose blood  (ACCU-CHEK GUIDE TEST) test strip Use as instructed to check blood sugars 3 times a day with meals 100 each 12   Insulin  Pen Needle (PX SHORTLENGTH PEN NEEDLES) 31G X 8 MM MISC 1 each by Does not apply route 2 (two) times daily before a meal. 100 each 12   Semaglutide , 2 MG/DOSE, 8 MG/3ML SOPN Inject 2 mg as directed once a week. 3 mL 4   valsartan -hydrochlorothiazide  (DIOVAN -HCT) 80-12.5 MG tablet TAKE 1 TABLET BY MOUTH EVERY DAY 90 tablet 1   Agalsidase beta  (FABRAZYME  IV) Inject 115 mg into the vein every 14 (fourteen) days.  (Patient not taking: Reported on 04/30/2024)     lidocaine -prilocaine  (EMLA ) cream APPLY THIN LAYER TO AFFECTED AREA OF SKIN DAILY AS NEEDED (Patient not taking: Reported on 04/30/2024) 30 g 0   No current facility-administered medications on file prior to visit.  Allergies  Allergen Reactions   Lisinopril Anaphylaxis   Shellfish Allergy Hives and Swelling    Social History   Socioeconomic History   Marital status: Divorced    Spouse name: Not on file   Number of children: Not on file   Years of education: Not on file   Highest education level: Not on file  Occupational History   Not on file  Tobacco Use   Smoking status: Never   Smokeless tobacco: Never   Tobacco comments:    Never smoked. 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    Social Drivers of Health   Financial Resource Strain: Low Risk  (10/04/2023)   Overall Financial Resource Strain (CARDIA)    Difficulty of Paying Living Expenses: Not hard at all  Food Insecurity: No Food Insecurity (10/04/2023)   Hunger Vital Sign    Worried About Running Out of Food in the Last Year: Never true    Ran Out of Food in  the Last Year: Never true  Transportation Needs: No Transportation Needs (10/04/2023)   PRAPARE - Administrator, Civil Service (Medical): No    Lack of Transportation (Non-Medical): No  Physical Activity: Insufficiently Active (10/04/2023)   Exercise Vital Sign    Days of Exercise per Week: 3 days    Minutes of Exercise per Session: 30 min  Stress: No Stress Concern Present (10/04/2023)   [employer] of Occupational Health - Occupational Stress Questionnaire    Feeling of Stress : Not at all  Social Connections: Socially Isolated (10/04/2023)   Social Connection and Isolation Panel    Frequency of Communication with Friends and Family: More than three times a week    Frequency of Social Gatherings with Friends and Family: Never    Attends Religious Services: Never    Database Administrator or Organizations: No    Attends Banker Meetings: Never    Marital Status: Divorced  Catering Manager Violence: Not At Risk (10/04/2023)   Humiliation, Afraid, Rape, and Kick questionnaire    Fear of Current or Ex-Partner: No    Emotionally Abused: No    Physically Abused: No    Sexually Abused: No    Family History  Problem Relation Age of Onset   Stroke Mother    Fabry's disease Mother    Fabry's disease Brother    Colon cancer Neg Hx     Past Surgical History:  Procedure Laterality Date   CARDIAC CATHETERIZATION  03-12-2011  DR VICTORY SHARPS   HYPERTROPHIC CARDIOMYOPATHY WITH LV CAVITY  APPEARANCE CONSISTANT WITH SIGNIFICANT APICAL HYPERTROPHY/ NORMAL LVSF / EF 55%/ EVIDENCE OF DIASTOLIC DYSFUNCTION WITH EDP OF 23-45mmHg AFTER A-WAVE/ NORMAL CORONARY ARTERIES   EVALUATION UNDER ANESTHESIA WITH FISTULECTOMY N/A 06/23/2015   Procedure: EXAM UNDER ANESTHESIA WITH POSSIBLE FISTULOTOMY;  Surgeon: Lynwood Pina, MD;  Location: [facility] OR;  Service: General;  Laterality: N/A;   HERNIA REPAIR  06/08/2011   Umbilical Hernia Repair   INCISION AND DRAINAGE PERIRECTAL ABSCESS  Left 07/29/2014   Procedure: IRRIGATION AND DEBRIDEMENT PERIRECTAL ABSCESS;  Surgeon: Gordy Pina, MD;  Location: [facility] OR;  Service: General;  Laterality: Left;  Prone position   INSERT / REPLACE / REMOVE PACEMAKER  02/12/2011   SJM implanted by Dr Kelsie for Mobitz II second degree AV block and syncope   IR IMAGING GUIDED PORT INSERTION  03/02/2023   IRRIGATION AND DEBRIDEMENT ABSCESS N/A 07/27/2013   Procedure: IRRIGATION AND DEBRIDEMENT OF SKIN, SOFT TISSUE AND MUSCLES OF UPPER BACK (11X19X4cm) WITH 10 BLADE AND PULSATILE LAVAGE ;  Surgeon: Camellia CHRISTELLA Blush, MD;  Location: [facility] OR;  Service: General;  Laterality: N/A;   PPM GENERATOR CHANGEOUT N/A 05/22/2019   Procedure: PPM GENERATOR CHANGEOUT;  Surgeon: Kelsie Agent, MD;  Location: [facility] INVASIVE CV LAB;  Service: Cardiovascular;  Laterality: N/A;   UMBILICAL HERNIA REPAIR  03/22/2012   Procedure: HERNIA REPAIR UMBILICAL ADULT;  Surgeon: Bernarda Ned, MD;  Location: [facility] ORS;  Service: General;  Laterality: N/A;  Umbilical Hernia Repair     ROS: Review of Systems Negative except as stated above  PHYSICAL EXAM: BP 129/77   Pulse 64   Ht 5' 10 (1.778 m)   Wt 272 lb (123.4 kg)   SpO2 99%   BMI 39.03 kg/m   Wt Readings from Last 3 Encounters:  04/30/24 272 lb (123.4 kg)  04/17/24 280 lb (127 kg)  02/07/24 264 lb 6.4 oz (119.9 kg)    Physical Exam General appearance - alert, well appearing, older AAM and in no distress. Ambulates with a cane Mental status - alert, oriented to person, place, and time Neck - supple, no significant adenopathy Chest - clear to auscultation, no wheezes, rales or rhonchi, symmetric air entry Heart - normal rate, regular rhythm, normal S1, S2, no murmurs, rubs, clicks or gallops Extremities - pt wearing compression socks. He has nonpitting edema     Latest Ref Rng & Units 06/25/2022    3:32 PM 10/26/2021    9:45 AM 02/23/2021   10:26 AM  CMP  Glucose 70 - 99 mg/dL 589  888  830   BUN 8 - 27 mg/dL 30  20  27     Creatinine 0.76 - 1.27 mg/dL 7.73  8.13  8.11   Sodium 134 - 144 mmol/L 140  149  146   Potassium 3.5 - 5.2 mmol/L 4.5  4.3  4.7   Chloride 96 - 106 mmol/L 100  109  107   CO2 20 - 29 mmol/L 22  22  24    Calcium  8.6 - 10.2 mg/dL 9.6  9.8  9.7   Total Protein 6.0 - 8.5 g/dL 7.5  7.8    Total Bilirubin 0.0 - 1.2 mg/dL 0.9  0.5    Alkaline Phos 44 - 121 IU/L 132  138    AST 0 - 40 IU/L 33  33    ALT 0 - 44 IU/L 29  32     Lipid Panel     Component Value Date/Time   CHOL 153 12/05/2023 0858   TRIG 53 12/05/2023 0858   HDL 63 12/05/2023 0858   CHOLHDL 2.4 12/05/2023 0858   CHOLHDL 4.4 10/20/2018 0324   VLDL 18 10/20/2018 0324   LDLCALC 79 12/05/2023 0858    CBC    Component Value Date/Time   WBC 7.3 06/25/2022 1532   WBC 7.0 05/22/2019 1201   RBC 5.21 06/25/2022 1532   RBC 5.29 05/22/2019 1201   HGB 15.3 06/25/2022 1532   HCT 45.4 06/25/2022 1532   PLT 178 06/25/2022 1532   MCV 87 06/25/2022 1532   MCH 29.4 06/25/2022 1532   MCH 29.3 05/22/2019  1201   MCHC 33.7 06/25/2022 1532   MCHC 32.1 05/22/2019 1201   RDW 12.1 06/25/2022 1532   LYMPHSABS 2.9 08/23/2019 1152   MONOABS 1.3 (H) 10/24/2018 0515   EOSABS 0.3 08/23/2019 1152   BASOSABS 0.1 08/23/2019 1152    ASSESSMENT AND PLAN: 1. Type 2 diabetes mellitus with morbid obesity (HCC) (Primary) A1c close to goal. Patient has not have his blood sugar log today to share.  He will continue Ozempic  2 mg daily.  He has a follow-up with our clinical pharmacist early part of next month.  Advised to remember to bring his readings with him or the reader from his sensor. - We talked about things that can cause loss of connection from his sensor including layers of clothing, laying on the side that the sensor is on or being too far away from the reader.  Advised that he keeps his reader in his pocket and try to avoid laying on the side that the sensor is on at night times.  2. Hypertension associated with type 2 diabetes mellitus  (HCC) At goal.  Continue amlodipine  10 mg daily, Diovan /HCTZ 80/12.5 mg daily, furosemide  20 mg daily and doxazosin  2 mg half a tablet daily.  3. Stage 3b chronic kidney disease (HCC) Stable.  Will continue to monitor.  4. 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HCC) He continues to receive his enzyme infusion every 2 weeks through his nephrologist.  5. History of CVA with residual deficit Continue Plavix  and atorvastatin   6. Cataract, unspecified cataract type, unspecified laterality Message sent to our caseworker to see if she has any ideas about someone being able to stay with him during and after his cataract surgery.  7. Screening for colon cancer - Ambulatory referral to Gastroenterology  8. Need for immunization against influenza - Flu vaccine trivalent PF, 6mos and older(Flulaval ,Afluria,Fluarix,Fluzone )   Patient was given the opportunity to ask questions.  Patient verbalized understanding of the plan and was able to repeat key elements of the plan.   This documentation was completed using Paediatric nurse.  Any transcriptional errors are unintentional.  Orders Placed This Encounter  Procedures   Flu vaccine trivalent PF, 6mos and older(Flulaval ,Afluria,Fluarix,Fluzone )   Ambulatory referral to Gastroenterology     Requested Prescriptions    No prescriptions requested or ordered in this encounter    Return in about 4 months (around 08/28/2024).  Barnie Louder, MD, FACP

## 2024-05-01 ENCOUNTER — Telehealth: Payer: Self-pay | Admitting: *Deleted

## 2024-05-01 DIAGNOSIS — I10 Essential (primary) hypertension: Secondary | ICD-10-CM

## 2024-05-01 NOTE — Progress Notes (Signed)
 Complex Care Management Note Care Guide Note  05/01/2024 Name: [name] MRN: [id number] DOB: [birth year]   Complex Care Management Outreach Attempts: An unsuccessful telephone outreach was attempted today to offer the patient information about available complex care management services.  Follow Up Plan:  Additional outreach attempts will be made to offer the patient complex care management information and services.   Encounter Outcome:  No Answer  Thedford Franks, CMA [place]  [facility], [facility] Guide Direct Dial: [phone]  Fax: [phone] Website: [email or web address]

## 2024-05-02 ENCOUNTER — Telehealth: Payer: Self-pay | Admitting: Internal Medicine

## 2024-05-02 ENCOUNTER — Telehealth: Payer: Self-pay

## 2024-05-02 NOTE — Telephone Encounter (Signed)
 Message received from Dr Vicci stating: Patient is needing to have cataract surgery.  He was told that he would need to have someone there with him perioperatively and for several hours when he returns home.  He does not have any friends or family in the area.  Any ideas?    I called the patient and he confirmed the need to have someone accompany him to the procedure, stay at the office while the procedure is performed and then go home with him.  He said he has no family or friends in this area, he is not from here.  He explained that he went to see is sister in [place] with hopes to have the procedure done there and he was told that the insurance would not cover it because the facility there is out of the network.    I explained to the patient that we spoke about this type of need a couple of years ago.  He said he called private pay organizations and they were concerned about paying for gas/mileage for someone to transport him. He then said he has [facility] and I told him that he should check with [facility] as well as the provider's office to see if that transportation would be sufficient. I told him that [facility] will allow him to have a caregiver with him for no charge. He would only have to pay $2.50 each way for himself. He said that could be an option and he would check into it. I told him that he would still have to pay for the caregiver through the private pay agency.   He then said he will check with [facility] to see what they offer. I explained to him that [facility] is an all inclusive program and they would manage all of his care needs and his PCP would be through [facility].    He went to verbalize his frustration with the requirement to have an individual accompany him to a procedure when he has no family/ friends in this area. I told him that he will need to discuss that further with the specialty provider's office as I do not have any other resources at this time.   I encouraged him to contact [facility]  and inquire about having caregiver ride with him.  I also told him to call me back with any questions.

## 2024-05-02 NOTE — Telephone Encounter (Signed)
 Confirmed appt for /

## 2024-05-07 ENCOUNTER — Ambulatory Visit: Admitting: Pharmacist

## 2024-05-07 NOTE — Progress Notes (Unsigned)
 Complex Care Management Note Care Guide Note  05/07/2024 Name: [name] MRN: [id number] DOB: Jul 26, 1960   Complex Care Management Outreach Attempts: A second unsuccessful outreach was attempted today to offer the patient with information about available complex care management services.  Follow Up Plan:  Additional outreach attempts will be made to offer the patient complex care management information and services.   Encounter Outcome:  No Answer  Thedford Franks, CMA [place]  [facility], [facility] Guide Direct Dial: [phone]  Fax: [phone] Website: [email or web address]

## 2024-05-09 NOTE — Progress Notes (Signed)
 Complex Care Management Note Care Guide Note  05/09/2024 Name: [name] MRN: [id number] DOB: 05/02/1961   Complex Care Management Outreach Attempts: A third unsuccessful outreach was attempted today to offer the patient with information about available complex care management services.  Follow Up Plan:  No further outreach attempts will be made at this time. We have been unable to contact the patient to offer or enroll patient in complex care management services.  Encounter Outcome:  No Answer  Thedford Franks, CMA [place]  [facility], [facility] Guide Direct Dial: [phone]  Fax: [phone] Website: [email or web address]

## 2024-05-11 DIAGNOSIS — E7521 Fabry (-Anderson) disease: Secondary | ICD-10-CM | POA: Diagnosis not present

## 2024-05-15 NOTE — Progress Notes (Unsigned)
  Electrophysiology Office Note:   Date:  05/15/2024  ID:  KHALON CANSLER, DOB 14-Mar-1961, MRN [id number]  Primary Cardiologist: None Primary Heart Failure: None Electrophysiologist: None  {Click to update primary MD,subspecialty MD or APP then REFRESH:1}    History of Present Illness:   [name] is a 63 y.o. male with h/o Bell's palsy, Faver's disease, CKD stage III, hypertension, chronic lymphedema, diabetes, hypertrophic cardiomyopathy with diastolic heart failure, sick sinus syndrome and complete heart block, CVA seen today for routine electrophysiology followup.   Since last being seen in our clinic the patient reports doing ***.  he denies chest pain, palpitations, dyspnea, PND, orthopnea, nausea, vomiting, dizziness, syncope, edema, weight gain, or early satiety.   Review of systems complete and found to be negative unless listed in HPI.      EP Information / Studies Reviewed:    {EKGtoday:28818}      PPM Interrogation-  reviewed in detail today,  See [facility] report.  Device History: Abbott {Blank single:19197::Dual Chamber,Single Chamber,BiV,Leadless} PPM implanted 02/12/2011, generator change 05/22/2019 for sick sinus syndrome/complete heart block  Risk Assessment/Calculations:   {Does this patient have ATRIAL FIBRILLATION?:[phone]} No BP recorded.  {Refresh Note OR Click here to enter BP  :1}***        Physical Exam:   VS:  There were no vitals taken for this visit.   Wt Readings from Last 3 Encounters:  04/30/24 272 lb (123.4 kg)  04/17/24 280 lb (127 kg)  02/07/24 264 lb 6.4 oz (119.9 kg)     GEN: Well nourished, well developed in no acute distress NECK: No JVD; No carotid bruits CARDIAC: {EPRHYTHM:28826}, no murmurs, rubs, gallops RESPIRATORY:  Clear to auscultation without rales, wheezing or rhonchi  ABDOMEN: Soft, non-tender, non-distended EXTREMITIES:  No edema; No deformity   ASSESSMENT AND PLAN:    Sick sinus syndrome/complete  heart block s/p Abbott PPM  Normal PPM function See [facility] report No changes today  2.  Hypertrophic cardiomyopathy with diastolic heart failure: Has lymphedema with wraps on his legs.  3.  Atrial flutter: Has been subclinical.  Burden less than 1%.  Continue to monitor.  Disposition:   Follow up with EP Team {EPFOLLOW LE:71826}  Signed, Jia Mohamed Gladis Norton, MD

## 2024-05-16 ENCOUNTER — Encounter: Payer: Self-pay | Admitting: Cardiology

## 2024-05-16 ENCOUNTER — Ambulatory Visit: Attending: Cardiology | Admitting: Cardiology

## 2024-05-16 VITALS — BP 127/79 | HR 66 | Ht 70.0 in | Wt 270.5 lb

## 2024-05-16 DIAGNOSIS — I495 Sick sinus syndrome: Secondary | ICD-10-CM | POA: Diagnosis not present

## 2024-05-16 DIAGNOSIS — I5032 Chronic diastolic (congestive) heart failure: Secondary | ICD-10-CM

## 2024-05-16 LAB — CUP PACEART INCLINIC DEVICE CHECK
Battery Remaining Longevity: 25 mo
Battery Voltage: 2.95 V
Brady Statistic RA Percent Paced: 79 %
Brady Statistic RV Percent Paced: 99.54 %
Date Time Interrogation Session: 20251210092254
Implantable Lead Connection Status: 753985
Implantable Lead Connection Status: 753985
Implantable Lead Implant Date: 20120907
Implantable Lead Implant Date: 20120907
Implantable Lead Location: 753859
Implantable Lead Location: 753860
Implantable Pulse Generator Implant Date: 20201215
Lead Channel Impedance Value: 312.5 Ohm
Lead Channel Impedance Value: 387.5 Ohm
Lead Channel Pacing Threshold Amplitude: 0.75 V
Lead Channel Pacing Threshold Amplitude: 1.375 V
Lead Channel Pacing Threshold Pulse Width: 0.5 ms
Lead Channel Pacing Threshold Pulse Width: 0.8 ms
Lead Channel Sensing Intrinsic Amplitude: 5 mV
Lead Channel Setting Pacing Amplitude: 1.625
Lead Channel Setting Pacing Amplitude: 1.75 V
Lead Channel Setting Pacing Pulse Width: 0.8 ms
Lead Channel Setting Sensing Sensitivity: 4 mV
Pulse Gen Model: 2272
Pulse Gen Serial Number: 9187005

## 2024-05-25 DIAGNOSIS — E7521 Fabry (-Anderson) disease: Secondary | ICD-10-CM | POA: Diagnosis not present

## 2024-06-09 ENCOUNTER — Ambulatory Visit: Payer: Self-pay | Admitting: Cardiology

## 2024-06-14 ENCOUNTER — Other Ambulatory Visit: Payer: Self-pay | Admitting: Internal Medicine

## 2024-06-14 DIAGNOSIS — R3589 Other polyuria: Secondary | ICD-10-CM

## 2024-06-14 NOTE — Telephone Encounter (Signed)
 Requested Prescriptions  Pending Prescriptions Disp Refills   doxazosin  (CARDURA ) 2 MG tablet [Pharmacy Med Name: DOXAZOSIN  MESYLATE 2 MG TAB] 45 tablet 1    Sig: TAKE 1/2 TABLET BY MOUTH DAILY     Cardiovascular:  Alpha Blockers Passed - 06/14/2024  3:30 PM      Passed - Last BP in normal range    BP Readings from Last 1 Encounters:  05/16/24 127/79         Passed - Valid encounter within last 6 months    Recent Outpatient Visits           1 month ago Type 2 diabetes mellitus with morbid obesity (HCC)   [facility] [place]. [facility] Vicci Sober B, MD   2 months ago Type 2 diabetes mellitus with morbid obesity [facility])   [facility] [place]. [facility] Fleeta Morris, Vilas L, RPH-CPP   4 months ago Type 2 diabetes mellitus with morbid obesity [facility])   [facility] [place]. [facility] Fleeta Morris, Pauline L, RPH-CPP   5 months ago Type 2 diabetes mellitus with morbid obesity [facility])   [facility] Of . [facility] Vicci Sober B, MD   6 months ago Type 2 diabetes mellitus with obesity   [facility] Dept Of . [facility] Fleeta Morris Garnette LITTIE, RPH-CPP

## 2024-06-18 ENCOUNTER — Ambulatory Visit (INDEPENDENT_AMBULATORY_CARE_PROVIDER_SITE_OTHER): Payer: Medicare HMO

## 2024-06-18 DIAGNOSIS — I441 Atrioventricular block, second degree: Secondary | ICD-10-CM

## 2024-06-19 LAB — CUP PACEART REMOTE DEVICE CHECK
Battery Remaining Longevity: 25 mo
Battery Remaining Percentage: 29 %
Battery Voltage: 2.95 V
Brady Statistic AP VP Percent: 71 %
Brady Statistic AP VS Percent: 1 %
Brady Statistic AS VP Percent: 29 %
Brady Statistic AS VS Percent: 1 %
Brady Statistic RA Percent Paced: 70 %
Brady Statistic RV Percent Paced: 99 %
Date Time Interrogation Session: 20260112184642
Implantable Lead Connection Status: 753985
Implantable Lead Connection Status: 753985
Implantable Lead Implant Date: 20120907
Implantable Lead Implant Date: 20120907
Implantable Lead Location: 753859
Implantable Lead Location: 753860
Implantable Pulse Generator Implant Date: 20201215
Lead Channel Impedance Value: 300 Ohm
Lead Channel Impedance Value: 380 Ohm
Lead Channel Pacing Threshold Amplitude: 0.75 V
Lead Channel Pacing Threshold Amplitude: 1.875 V
Lead Channel Pacing Threshold Pulse Width: 0.5 ms
Lead Channel Pacing Threshold Pulse Width: 0.8 ms
Lead Channel Sensing Intrinsic Amplitude: 5 mV
Lead Channel Sensing Intrinsic Amplitude: 9.1 mV
Lead Channel Setting Pacing Amplitude: 1.75 V
Lead Channel Setting Pacing Amplitude: 2.125
Lead Channel Setting Pacing Pulse Width: 0.8 ms
Lead Channel Setting Sensing Sensitivity: 4 mV
Pulse Gen Model: 2272
Pulse Gen Serial Number: 9187005

## 2024-06-20 ENCOUNTER — Ambulatory Visit: Payer: Self-pay | Admitting: Cardiology

## 2024-06-22 NOTE — Progress Notes (Signed)
 Remote PPM Transmission

## 2024-06-26 ENCOUNTER — Other Ambulatory Visit: Payer: Self-pay | Admitting: Internal Medicine

## 2024-06-26 DIAGNOSIS — R0982 Postnasal drip: Secondary | ICD-10-CM

## 2024-06-26 NOTE — Telephone Encounter (Signed)
 Requested Prescriptions  Pending Prescriptions Disp Refills   fluticasone  (FLONASE ) 50 MCG/ACT nasal spray [Pharmacy Med Name: FLUTICASONE  PROP 50 MCG SPRAY] 48 mL 1    Sig: PLACE 1 SPRAY INTO BOTH NOSTRILS DAILY AS NEEDED FOR ALLERGIES.     Ear, Nose, and Throat: Nasal Preparations - Corticosteroids Passed - 06/26/2024  2:21 PM      Passed - Valid encounter within last 12 months    Recent Outpatient Visits           1 month ago Type 2 diabetes mellitus with morbid obesity (HCC)   [place] [facility] - [facility] Dept Of [place]. [place] [facility] Vicci Sober B, MD   3 months ago Type 2 diabetes mellitus with morbid obesity [facility])   [place] [facility] - A Dept Of [place]. [facility] Fleeta Morris, [place] L, RPH-CPP   4 months ago Type 2 diabetes mellitus with morbid obesity [facility])   [place] [facility] - A Dept Of [place]. [facility] Fleeta Morris, [place] L, RPH-CPP   6 months ago Type 2 diabetes mellitus with morbid obesity [facility])   [place] [facility] - A Dept Of [place]. [facility] Vicci Sober B, MD   6 months ago Type 2 diabetes mellitus with obesity    [facility] - A Dept Of [place]. [facility] Fleeta Morris Garnette LITTIE, RPH-CPP

## 2024-06-27 NOTE — Progress Notes (Signed)
 [name]                                          MRN: [id number]   06/27/2024   The [facility] Quality Team Specialist reviewed this patient medical record for the purposes of chart review for care gap closure. The following were reviewed: abstraction for care gap closure-glycemic status assessment.    [facility] Quality Team

## 2024-07-01 ENCOUNTER — Other Ambulatory Visit: Payer: Self-pay | Admitting: Internal Medicine

## 2024-07-01 DIAGNOSIS — M109 Gout, unspecified: Secondary | ICD-10-CM

## 2024-07-01 DIAGNOSIS — G463 Brain stem stroke syndrome: Secondary | ICD-10-CM

## 2024-07-09 ENCOUNTER — Ambulatory Visit: Admitting: Pulmonary Disease

## 2024-07-26 ENCOUNTER — Ambulatory Visit: Admitting: Pulmonary Disease

## 2024-08-28 ENCOUNTER — Ambulatory Visit: Admitting: Internal Medicine

## 2024-10-09 ENCOUNTER — Ambulatory Visit
